# Patient Record
Sex: Female | Born: 1960 | Race: White | Hispanic: No | Marital: Married | Smoking: Never smoker
Health system: Southern US, Community
[De-identification: ages and names within clinical notes are randomized; demographics above are authoritative.]

## PROBLEM LIST (undated history)

## (undated) DIAGNOSIS — M86271 Subacute osteomyelitis, right ankle and foot: Secondary | ICD-10-CM

## (undated) DIAGNOSIS — E119 Type 2 diabetes mellitus without complications: Secondary | ICD-10-CM

## (undated) DIAGNOSIS — M5136 Other intervertebral disc degeneration, lumbar region: Secondary | ICD-10-CM

## (undated) DIAGNOSIS — N289 Disorder of kidney and ureter, unspecified: Secondary | ICD-10-CM

## (undated) DIAGNOSIS — R269 Unspecified abnormalities of gait and mobility: Secondary | ICD-10-CM

## (undated) DIAGNOSIS — I10 Essential (primary) hypertension: Secondary | ICD-10-CM

## (undated) DIAGNOSIS — D631 Anemia in chronic kidney disease: Secondary | ICD-10-CM

## (undated) DIAGNOSIS — N189 Chronic kidney disease, unspecified: Secondary | ICD-10-CM

## (undated) DIAGNOSIS — M51369 Other intervertebral disc degeneration, lumbar region without mention of lumbar back pain or lower extremity pain: Secondary | ICD-10-CM

## (undated) DIAGNOSIS — I96 Gangrene, not elsewhere classified: Secondary | ICD-10-CM

## (undated) DIAGNOSIS — G629 Polyneuropathy, unspecified: Secondary | ICD-10-CM

## (undated) HISTORY — DX: Unspecified abnormalities of gait and mobility: R26.9

## (undated) HISTORY — DX: Anemia in chronic kidney disease: D63.1

## (undated) HISTORY — DX: Essential (primary) hypertension: I10

## (undated) HISTORY — PX: KIDNEY TRANSPLANT: SHX239

## (undated) HISTORY — DX: Other intervertebral disc degeneration, lumbar region without mention of lumbar back pain or lower extremity pain: M51.369

## (undated) HISTORY — PX: LEG SURGERY: SHX1003

## (undated) HISTORY — DX: Disorder of kidney and ureter, unspecified: N28.9

## (undated) HISTORY — DX: Anemia in chronic kidney disease: N18.9

## (undated) HISTORY — DX: Polyneuropathy, unspecified: G62.9

## (undated) HISTORY — PX: BLADDER SURGERY: SHX569

## (undated) HISTORY — DX: Other intervertebral disc degeneration, lumbar region: M51.36

## (undated) NOTE — *Deleted (*Deleted)
Pharmacy Student Note: For Learning Purposes ONLY. Not an active part of the chart.  Chief Complaint:  [id number] presenting with anemia, AKI, and anasarca. PMH: T1DM, PAD, chronic OM, CKD stage 3A PSH: kidney and pancreas transplant; right BKA  Recommendations:  -waiting on cyclosporine lvl to be released  -continuing cyclosporine, azathio, and prednisone   Admit Complaint: Anticoagulation: heparin injection 5,000 units Q8H Infectious Disease Hx chronic osteomyelitis (right BKA) not on ABX Cardiovascular: anasarca-pitting edema all over the body 11/9-11/10 Bps 150s-160s Metoprolol tartrate 12.5 mg BID (PTA dose)  Endocrinology: T1DM; pred imm supp; levo 50 mcg 18 units glargine >  6 units BID d/t morning hypogly (70s) > now, BG in 200s and rising throughout the day   Gastrointestinal / Nutrition: supplementing  Nephrology: non-oliguric AKI Scr 3.05 --> 2.67 on 11/8 (improving)  > 2.47 > 2.21 today improving! > potentially new normal BL Scr 1.3 UOP 500 mL; 7.8 mL/kg  Electrolytes: Albumin repleted > 3.3 now Ca 9.4 K 5.7 (given Lokelma once) -->K 4.9 (rising up) -on cyclosporine and BB > will monitor (sod. Bicarb decr K) CO2 25 > on sodium bicarb at home and inpt Phosphorus: 7.3 (CKD stage 3)  --> 4.7 > 4.3   Hematology / Oncology: anemia Hgb trending down-8.7 11/10 Iron repleted per labs 11/3 2 units earlier in admit Aranesp 100 mcg QFriday   Dermatologic Left heel and sacrum wounds --> Santyl   PTA Medication Issues APAP 325  Hydrocodone/APAP 5-325 mg Q4HR Insulin aspart 2 units TID Insulin detemir 24 units daily Nutrition suppl. Sertraline 25 mg daily Sodium bicarbonate 650 mg BID Atorvastatin 20 mg QHS Azathioprine 50 mg daily Cyclosporine 75 mg BID Levothyroxine 50 mcg daily Metoprolol tartrate 12.5 mg BID Prednisone 5 mg daily   Best Practices   Glean Salvo, Festus Aloe PharmD Student

---

## 2019-11-27 DIAGNOSIS — E109 Type 1 diabetes mellitus without complications: Secondary | ICD-10-CM | POA: Diagnosis not present

## 2019-11-27 DIAGNOSIS — M810 Age-related osteoporosis without current pathological fracture: Secondary | ICD-10-CM | POA: Diagnosis not present

## 2019-11-27 DIAGNOSIS — Z940 Kidney transplant status: Secondary | ICD-10-CM | POA: Diagnosis not present

## 2019-11-27 DIAGNOSIS — Z9483 Pancreas transplant status: Secondary | ICD-10-CM | POA: Diagnosis not present

## 2020-01-01 DIAGNOSIS — E039 Hypothyroidism, unspecified: Secondary | ICD-10-CM | POA: Diagnosis not present

## 2020-01-01 DIAGNOSIS — E1065 Type 1 diabetes mellitus with hyperglycemia: Secondary | ICD-10-CM | POA: Diagnosis not present

## 2020-01-01 DIAGNOSIS — E1021 Type 1 diabetes mellitus with diabetic nephropathy: Secondary | ICD-10-CM | POA: Diagnosis not present

## 2020-01-01 DIAGNOSIS — E7800 Pure hypercholesterolemia, unspecified: Secondary | ICD-10-CM | POA: Diagnosis not present

## 2020-01-11 DIAGNOSIS — E1021 Type 1 diabetes mellitus with diabetic nephropathy: Secondary | ICD-10-CM | POA: Diagnosis not present

## 2020-01-21 DIAGNOSIS — E1021 Type 1 diabetes mellitus with diabetic nephropathy: Secondary | ICD-10-CM | POA: Diagnosis not present

## 2020-03-10 DIAGNOSIS — D631 Anemia in chronic kidney disease: Secondary | ICD-10-CM | POA: Diagnosis not present

## 2020-03-10 DIAGNOSIS — Z940 Kidney transplant status: Secondary | ICD-10-CM | POA: Diagnosis not present

## 2020-03-10 DIAGNOSIS — I129 Hypertensive chronic kidney disease with stage 1 through stage 4 chronic kidney disease, or unspecified chronic kidney disease: Secondary | ICD-10-CM | POA: Diagnosis not present

## 2020-03-10 DIAGNOSIS — N189 Chronic kidney disease, unspecified: Secondary | ICD-10-CM | POA: Diagnosis not present

## 2020-03-10 DIAGNOSIS — N1831 Chronic kidney disease, stage 3a: Secondary | ICD-10-CM | POA: Diagnosis not present

## 2020-03-10 DIAGNOSIS — N2581 Secondary hyperparathyroidism of renal origin: Secondary | ICD-10-CM | POA: Diagnosis not present

## 2020-03-11 DIAGNOSIS — N390 Urinary tract infection, site not specified: Secondary | ICD-10-CM | POA: Diagnosis not present

## 2020-03-17 DIAGNOSIS — L97529 Non-pressure chronic ulcer of other part of left foot with unspecified severity: Secondary | ICD-10-CM | POA: Diagnosis not present

## 2020-03-17 DIAGNOSIS — L97512 Non-pressure chronic ulcer of other part of right foot with fat layer exposed: Secondary | ICD-10-CM | POA: Diagnosis not present

## 2020-03-17 DIAGNOSIS — E08621 Diabetes mellitus due to underlying condition with foot ulcer: Secondary | ICD-10-CM | POA: Diagnosis not present

## 2020-03-17 DIAGNOSIS — L97519 Non-pressure chronic ulcer of other part of right foot with unspecified severity: Secondary | ICD-10-CM | POA: Diagnosis not present

## 2020-03-17 DIAGNOSIS — E1042 Type 1 diabetes mellitus with diabetic polyneuropathy: Secondary | ICD-10-CM | POA: Diagnosis not present

## 2020-03-17 DIAGNOSIS — L819 Disorder of pigmentation, unspecified: Secondary | ICD-10-CM | POA: Diagnosis not present

## 2020-03-28 DIAGNOSIS — N189 Chronic kidney disease, unspecified: Secondary | ICD-10-CM | POA: Diagnosis not present

## 2020-03-28 DIAGNOSIS — Z940 Kidney transplant status: Secondary | ICD-10-CM | POA: Diagnosis not present

## 2020-03-28 DIAGNOSIS — N1831 Chronic kidney disease, stage 3a: Secondary | ICD-10-CM | POA: Diagnosis not present

## 2020-03-31 DIAGNOSIS — N1831 Chronic kidney disease, stage 3a: Secondary | ICD-10-CM | POA: Diagnosis not present

## 2020-03-31 DIAGNOSIS — E10621 Type 1 diabetes mellitus with foot ulcer: Secondary | ICD-10-CM | POA: Diagnosis not present

## 2020-03-31 DIAGNOSIS — L97412 Non-pressure chronic ulcer of right heel and midfoot with fat layer exposed: Secondary | ICD-10-CM | POA: Diagnosis not present

## 2020-03-31 DIAGNOSIS — E1021 Type 1 diabetes mellitus with diabetic nephropathy: Secondary | ICD-10-CM | POA: Diagnosis not present

## 2020-03-31 DIAGNOSIS — Z940 Kidney transplant status: Secondary | ICD-10-CM | POA: Diagnosis not present

## 2020-03-31 DIAGNOSIS — D84821 Immunodeficiency due to drugs: Secondary | ICD-10-CM | POA: Diagnosis not present

## 2020-04-01 DIAGNOSIS — C44622 Squamous cell carcinoma of skin of right upper limb, including shoulder: Secondary | ICD-10-CM | POA: Diagnosis not present

## 2020-04-01 DIAGNOSIS — D0461 Carcinoma in situ of skin of right upper limb, including shoulder: Secondary | ICD-10-CM | POA: Diagnosis not present

## 2020-04-14 DIAGNOSIS — T8133XD Disruption of traumatic injury wound repair, subsequent encounter: Secondary | ICD-10-CM | POA: Diagnosis not present

## 2020-04-14 DIAGNOSIS — E10621 Type 1 diabetes mellitus with foot ulcer: Secondary | ICD-10-CM | POA: Diagnosis not present

## 2020-04-14 DIAGNOSIS — E1021 Type 1 diabetes mellitus with diabetic nephropathy: Secondary | ICD-10-CM | POA: Diagnosis not present

## 2020-04-14 DIAGNOSIS — L89623 Pressure ulcer of left heel, stage 3: Secondary | ICD-10-CM | POA: Diagnosis not present

## 2020-04-15 DIAGNOSIS — E1021 Type 1 diabetes mellitus with diabetic nephropathy: Secondary | ICD-10-CM | POA: Diagnosis not present

## 2020-04-17 DIAGNOSIS — M79672 Pain in left foot: Secondary | ICD-10-CM | POA: Diagnosis not present

## 2020-04-17 DIAGNOSIS — M2041 Other hammer toe(s) (acquired), right foot: Secondary | ICD-10-CM | POA: Diagnosis not present

## 2020-04-17 DIAGNOSIS — M2042 Other hammer toe(s) (acquired), left foot: Secondary | ICD-10-CM | POA: Diagnosis not present

## 2020-04-17 DIAGNOSIS — L97422 Non-pressure chronic ulcer of left heel and midfoot with fat layer exposed: Secondary | ICD-10-CM | POA: Diagnosis not present

## 2020-04-17 DIAGNOSIS — M79671 Pain in right foot: Secondary | ICD-10-CM | POA: Diagnosis not present

## 2020-04-17 DIAGNOSIS — M24572 Contracture, left ankle: Secondary | ICD-10-CM | POA: Diagnosis not present

## 2020-04-17 DIAGNOSIS — L97412 Non-pressure chronic ulcer of right heel and midfoot with fat layer exposed: Secondary | ICD-10-CM | POA: Diagnosis not present

## 2020-04-18 DIAGNOSIS — B9561 Methicillin susceptible Staphylococcus aureus infection as the cause of diseases classified elsewhere: Secondary | ICD-10-CM | POA: Diagnosis not present

## 2020-04-18 DIAGNOSIS — B961 Klebsiella pneumoniae [K. pneumoniae] as the cause of diseases classified elsewhere: Secondary | ICD-10-CM | POA: Diagnosis not present

## 2020-04-18 DIAGNOSIS — L97422 Non-pressure chronic ulcer of left heel and midfoot with fat layer exposed: Secondary | ICD-10-CM | POA: Diagnosis not present

## 2020-04-18 DIAGNOSIS — B966 Bacteroides fragilis [B. fragilis] as the cause of diseases classified elsewhere: Secondary | ICD-10-CM | POA: Diagnosis not present

## 2020-04-21 DIAGNOSIS — R296 Repeated falls: Secondary | ICD-10-CM | POA: Diagnosis not present

## 2020-04-21 DIAGNOSIS — L97512 Non-pressure chronic ulcer of other part of right foot with fat layer exposed: Secondary | ICD-10-CM | POA: Diagnosis not present

## 2020-04-21 DIAGNOSIS — Z1239 Encounter for other screening for malignant neoplasm of breast: Secondary | ICD-10-CM | POA: Diagnosis not present

## 2020-04-21 DIAGNOSIS — E1042 Type 1 diabetes mellitus with diabetic polyneuropathy: Secondary | ICD-10-CM | POA: Diagnosis not present

## 2020-04-23 DIAGNOSIS — E1021 Type 1 diabetes mellitus with diabetic nephropathy: Secondary | ICD-10-CM | POA: Diagnosis not present

## 2020-05-13 ENCOUNTER — Other Ambulatory Visit (HOSPITAL_COMMUNITY): Payer: Self-pay | Admitting: *Deleted

## 2020-05-13 NOTE — Discharge Instructions (Signed)

## 2020-05-14 ENCOUNTER — Inpatient Hospital Stay (HOSPITAL_COMMUNITY)
Admission: RE | Admit: 2020-05-14 | Discharge: 2020-05-14 | Disposition: A | Discharge status: Home / Self Care | Payer: Self-pay | Source: Ambulatory Visit | Attending: Nephrology | Admitting: Nephrology

## 2020-05-14 ENCOUNTER — Encounter: Payer: Self-pay | Admitting: Nephrology

## 2020-05-14 ENCOUNTER — Encounter (HOSPITAL_COMMUNITY): Payer: Self-pay

## 2020-05-14 DIAGNOSIS — N1831 Chronic kidney disease, stage 3a: Secondary | ICD-10-CM | POA: Insufficient documentation

## 2020-05-14 DIAGNOSIS — Z940 Kidney transplant status: Secondary | ICD-10-CM | POA: Insufficient documentation

## 2020-05-14 DIAGNOSIS — I10 Essential (primary) hypertension: Secondary | ICD-10-CM | POA: Insufficient documentation

## 2020-05-14 DIAGNOSIS — D631 Anemia in chronic kidney disease: Secondary | ICD-10-CM | POA: Insufficient documentation

## 2020-05-14 DIAGNOSIS — N1832 Chronic kidney disease, stage 3b: Secondary | ICD-10-CM | POA: Insufficient documentation

## 2020-05-14 DIAGNOSIS — N189 Chronic kidney disease, unspecified: Secondary | ICD-10-CM | POA: Insufficient documentation

## 2020-05-28 ENCOUNTER — Encounter (HOSPITAL_COMMUNITY): Payer: Self-pay

## 2020-06-02 ENCOUNTER — Encounter: Payer: Self-pay | Admitting: Nephrology

## 2020-06-02 DIAGNOSIS — I10 Essential (primary) hypertension: Secondary | ICD-10-CM | POA: Diagnosis not present

## 2020-06-02 DIAGNOSIS — M62830 Muscle spasm of back: Secondary | ICD-10-CM | POA: Diagnosis not present

## 2020-06-02 DIAGNOSIS — E782 Mixed hyperlipidemia: Secondary | ICD-10-CM | POA: Diagnosis not present

## 2020-06-02 DIAGNOSIS — E1042 Type 1 diabetes mellitus with diabetic polyneuropathy: Secondary | ICD-10-CM | POA: Diagnosis not present

## 2020-06-02 DIAGNOSIS — M549 Dorsalgia, unspecified: Secondary | ICD-10-CM | POA: Diagnosis not present

## 2020-06-02 NOTE — Discharge Instructions (Signed)

## 2020-06-03 ENCOUNTER — Encounter (HOSPITAL_COMMUNITY): Payer: Self-pay

## 2020-06-03 ENCOUNTER — Inpatient Hospital Stay (HOSPITAL_COMMUNITY)
Admission: RE | Admit: 2020-06-03 | Discharge: 2020-06-03 | Disposition: A | Discharge status: Home / Self Care | Payer: Self-pay | Source: Ambulatory Visit | Attending: Nephrology | Admitting: Nephrology

## 2020-06-17 ENCOUNTER — Encounter (HOSPITAL_COMMUNITY): Payer: Self-pay

## 2020-06-17 DIAGNOSIS — M545 Low back pain: Secondary | ICD-10-CM | POA: Diagnosis not present

## 2020-07-08 DIAGNOSIS — E1022 Type 1 diabetes mellitus with diabetic chronic kidney disease: Secondary | ICD-10-CM | POA: Diagnosis not present

## 2020-07-08 DIAGNOSIS — E1040 Type 1 diabetes mellitus with diabetic neuropathy, unspecified: Secondary | ICD-10-CM | POA: Diagnosis not present

## 2020-07-08 DIAGNOSIS — E10319 Type 1 diabetes mellitus with unspecified diabetic retinopathy without macular edema: Secondary | ICD-10-CM | POA: Diagnosis not present

## 2020-07-08 DIAGNOSIS — E039 Hypothyroidism, unspecified: Secondary | ICD-10-CM | POA: Diagnosis not present

## 2020-07-08 DIAGNOSIS — E1065 Type 1 diabetes mellitus with hyperglycemia: Secondary | ICD-10-CM | POA: Diagnosis not present

## 2020-07-22 DIAGNOSIS — M545 Low back pain: Secondary | ICD-10-CM | POA: Diagnosis not present

## 2020-07-22 DIAGNOSIS — M5416 Radiculopathy, lumbar region: Secondary | ICD-10-CM | POA: Diagnosis not present

## 2020-07-24 DIAGNOSIS — E1021 Type 1 diabetes mellitus with diabetic nephropathy: Secondary | ICD-10-CM | POA: Diagnosis not present

## 2020-07-28 DIAGNOSIS — M545 Low back pain: Secondary | ICD-10-CM | POA: Diagnosis not present

## 2020-08-08 DIAGNOSIS — M47816 Spondylosis without myelopathy or radiculopathy, lumbar region: Secondary | ICD-10-CM | POA: Diagnosis not present

## 2020-08-14 DIAGNOSIS — Z940 Kidney transplant status: Secondary | ICD-10-CM | POA: Diagnosis not present

## 2020-08-18 DIAGNOSIS — D631 Anemia in chronic kidney disease: Secondary | ICD-10-CM | POA: Diagnosis not present

## 2020-08-18 DIAGNOSIS — I129 Hypertensive chronic kidney disease with stage 1 through stage 4 chronic kidney disease, or unspecified chronic kidney disease: Secondary | ICD-10-CM | POA: Diagnosis not present

## 2020-08-18 DIAGNOSIS — Z940 Kidney transplant status: Secondary | ICD-10-CM | POA: Diagnosis not present

## 2020-08-18 DIAGNOSIS — N189 Chronic kidney disease, unspecified: Secondary | ICD-10-CM | POA: Diagnosis not present

## 2020-08-19 DIAGNOSIS — N189 Chronic kidney disease, unspecified: Secondary | ICD-10-CM | POA: Diagnosis not present

## 2020-08-19 DIAGNOSIS — Z940 Kidney transplant status: Secondary | ICD-10-CM | POA: Diagnosis not present

## 2020-08-20 DIAGNOSIS — B965 Pseudomonas (aeruginosa) (mallei) (pseudomallei) as the cause of diseases classified elsewhere: Secondary | ICD-10-CM | POA: Diagnosis not present

## 2020-08-20 DIAGNOSIS — B9689 Other specified bacterial agents as the cause of diseases classified elsewhere: Secondary | ICD-10-CM | POA: Diagnosis not present

## 2020-08-20 DIAGNOSIS — L97422 Non-pressure chronic ulcer of left heel and midfoot with fat layer exposed: Secondary | ICD-10-CM | POA: Diagnosis not present

## 2020-08-20 DIAGNOSIS — L97412 Non-pressure chronic ulcer of right heel and midfoot with fat layer exposed: Secondary | ICD-10-CM | POA: Diagnosis not present

## 2020-08-20 DIAGNOSIS — M2042 Other hammer toe(s) (acquired), left foot: Secondary | ICD-10-CM | POA: Diagnosis not present

## 2020-08-20 DIAGNOSIS — M2041 Other hammer toe(s) (acquired), right foot: Secondary | ICD-10-CM | POA: Diagnosis not present

## 2020-09-02 DIAGNOSIS — I10 Essential (primary) hypertension: Secondary | ICD-10-CM | POA: Diagnosis not present

## 2020-09-02 DIAGNOSIS — Z23 Encounter for immunization: Secondary | ICD-10-CM | POA: Diagnosis not present

## 2020-09-02 DIAGNOSIS — F321 Major depressive disorder, single episode, moderate: Secondary | ICD-10-CM | POA: Diagnosis not present

## 2020-09-02 DIAGNOSIS — Z0000 Encounter for general adult medical examination without abnormal findings: Secondary | ICD-10-CM | POA: Diagnosis not present

## 2020-09-02 DIAGNOSIS — R29898 Other symptoms and signs involving the musculoskeletal system: Secondary | ICD-10-CM | POA: Diagnosis not present

## 2020-09-02 DIAGNOSIS — Z940 Kidney transplant status: Secondary | ICD-10-CM | POA: Diagnosis not present

## 2020-09-04 DIAGNOSIS — M47816 Spondylosis without myelopathy or radiculopathy, lumbar region: Secondary | ICD-10-CM | POA: Diagnosis not present

## 2020-09-16 ENCOUNTER — Emergency Department (HOSPITAL_COMMUNITY)
Admission: EM | Admit: 2020-09-16 | Discharge: 2020-09-16 | Disposition: A | Discharge status: Home / Self Care | Payer: BC Managed Care – PPO | Attending: Emergency Medicine | Admitting: Emergency Medicine

## 2020-09-16 ENCOUNTER — Emergency Department (HOSPITAL_COMMUNITY): Payer: BC Managed Care – PPO

## 2020-09-16 ENCOUNTER — Encounter (HOSPITAL_COMMUNITY): Payer: Self-pay

## 2020-09-16 DIAGNOSIS — M545 Low back pain: Secondary | ICD-10-CM | POA: Insufficient documentation

## 2020-09-16 DIAGNOSIS — G8929 Other chronic pain: Secondary | ICD-10-CM | POA: Diagnosis not present

## 2020-09-16 DIAGNOSIS — R531 Weakness: Secondary | ICD-10-CM | POA: Diagnosis not present

## 2020-09-16 DIAGNOSIS — R0689 Other abnormalities of breathing: Secondary | ICD-10-CM | POA: Diagnosis not present

## 2020-09-16 DIAGNOSIS — M5489 Other dorsalgia: Secondary | ICD-10-CM | POA: Diagnosis not present

## 2020-09-16 DIAGNOSIS — M546 Pain in thoracic spine: Secondary | ICD-10-CM | POA: Diagnosis not present

## 2020-09-16 LAB — URINALYSIS, ROUTINE W REFLEX MICROSCOPIC
Bacteria, UA: NONE SEEN
Bilirubin Urine: NEGATIVE
Glucose, UA: NEGATIVE mg/dL
Ketones, ur: NEGATIVE mg/dL
Nitrite: NEGATIVE
Protein, ur: NEGATIVE mg/dL
Specific Gravity, Urine: 1.013 (ref 1.005–1.030)
pH: 5 (ref 5.0–8.0)

## 2020-09-16 LAB — CBC
HCT: 26.8 % — ABNORMAL LOW (ref 36.0–46.0)
Hemoglobin: 8.3 g/dL — ABNORMAL LOW (ref 12.0–15.0)
MCH: 35.9 pg — ABNORMAL HIGH (ref 26.0–34.0)
MCHC: 31 g/dL (ref 30.0–36.0)
MCV: 116 fL — ABNORMAL HIGH (ref 80.0–100.0)
Platelets: 290 10*3/uL (ref 150–400)
RBC: 2.31 MIL/uL — ABNORMAL LOW (ref 3.87–5.11)
RDW: 17.3 % — ABNORMAL HIGH (ref 11.5–15.5)
WBC: 7.1 10*3/uL (ref 4.0–10.5)
nRBC: 0 % (ref 0.0–0.2)

## 2020-09-16 LAB — COMPREHENSIVE METABOLIC PANEL
ALT: 19 U/L (ref 0–44)
AST: 23 U/L (ref 15–41)
Albumin: 2.3 g/dL — ABNORMAL LOW (ref 3.5–5.0)
Alkaline Phosphatase: 69 U/L (ref 38–126)
Anion gap: 12 (ref 5–15)
BUN: 47 mg/dL — ABNORMAL HIGH (ref 6–20)
CO2: 24 mmol/L (ref 22–32)
Calcium: 9.4 mg/dL (ref 8.9–10.3)
Chloride: 100 mmol/L (ref 98–111)
Creatinine, Ser: 1.33 mg/dL — ABNORMAL HIGH (ref 0.44–1.00)
GFR calc Af Amer: 50 mL/min — ABNORMAL LOW (ref >60)
GFR calc non Af Amer: 43 mL/min — ABNORMAL LOW (ref >60)
Glucose, Bld: 116 mg/dL — ABNORMAL HIGH (ref 70–99)
Potassium: 4.6 mmol/L (ref 3.5–5.1)
Sodium: 136 mmol/L (ref 135–145)
Total Bilirubin: 0.6 mg/dL (ref 0.3–1.2)
Total Protein: 5.9 g/dL — ABNORMAL LOW (ref 6.5–8.1)

## 2020-09-16 MED ORDER — HYDROCODONE-ACETAMINOPHEN 5-325 MG PO TABS: 1.0000 | ORAL_TABLET | ORAL | 0 refills | Status: DC | PRN

## 2020-09-16 MED ORDER — OXYCODONE HCL 5 MG PO TABS
5.0000 mg | ORAL_TABLET | Freq: Once | ORAL | Status: AC
  Administered 2020-09-16: 5 mg via ORAL
  Filled 2020-09-16: qty 1

## 2020-09-16 MED ORDER — LIDOCAINE 5 % EX PTCH: 1.0000 | MEDICATED_PATCH | CUTANEOUS | 0 refills | Status: DC

## 2020-09-16 NOTE — ED Triage Notes (Signed)
Pt presents via EMS from home. Pt has chronic back pain, hx of several kidney transplants. Pt does typically take medication for this but has been referred to a pain medication clinic. Pt has not been able to keep the pain under control since that time, not able to keep up with the pain management schedule.

## 2020-09-16 NOTE — Progress Notes (Signed)
TOC CM referral for [facility] PT/OT. Attempted call to pt and left HIPAA complaint message for a return call. Contacted ED provider for [facility] orders. [place], [place] ED TOC CM [phone]

## 2020-09-16 NOTE — Discharge Instructions (Addendum)
You were evaluated in the Emergency Department and after careful evaluation, we did not find any emergent condition requiring admission or further testing in the hospital.  Your exam/testing today was overall reassuring.  We recommend continued use of Tylenol at home for discomfort.  We also recommend using the numbing Lidoderm patches as needed.  For more significant pain you can use the Norco pain medication.  We think you would benefit from physical therapy at home.  Please return to the Emergency Department if you experience any worsening of your condition.  Thank you for allowing [place] to be a part of your care.

## 2020-09-16 NOTE — ED Notes (Addendum)
Patient unable to wait for SW and PT.

## 2020-09-16 NOTE — ED Provider Notes (Signed)
[facility] Emergency Department Provider Note MRN:  [id number]  Arrival date & time: 09/16/20     Chief Complaint   Back Pain   History of Present Illness   [name] is a 59 y.o. year-old female with a history of pancreas kidney transplant presenting to the ED with chief complaint of back pain.  Worsening low back pain over the past several weeks.  Worsening lower extremity weakness over the past 1 to 2 months.  Now having a lot of trouble walking due to the weakness.  Denies bowel or bladder dysfunction.  No fever.  Denies headache or vision change, no chest pain or shortness of breath, no abdominal pain, no burning with urination.  Pain is moderate to severe, Tylenol at home not helping.  Worse with motion and palpation.  Review of Systems  A complete 10 system review of systems was obtained and all systems are negative except as noted in the HPI and PMH.   Patient's Health History    Past Medical History:  Diagnosis Date  . Anemia of chronic renal failure     Past Surgical History:  Procedure Laterality Date  . BLADDER SURGERY    . KIDNEY TRANSPLANT    . LEG SURGERY      History reviewed. No pertinent family history.  Social History   Socioeconomic History  . Marital status: Married    Spouse name: Not on file  . Number of children: Not on file  . Years of education: Not on file  . Highest education level: Not on file  Occupational History  . Not on file  Tobacco Use  . Smoking status: Never Smoker  . Smokeless tobacco: Never Used  Substance and Sexual Activity  . Alcohol use: Never  . Drug use: Never  . Sexual activity: Not on file  Other Topics Concern  . Not on file  Social History Narrative  . Not on file   Social Determinants of Health   Financial Resource Strain:   . Difficulty of Paying Living Expenses: Not on file  Food Insecurity:   . Worried About Charity fundraiser in the Last Year: Not on file  . Ran Out of  Food in the Last Year: Not on file  Transportation Needs:   . Lack of Transportation (Medical): Not on file  . Lack of Transportation (Non-Medical): Not on file  Physical Activity:   . Days of Exercise per Week: Not on file  . Minutes of Exercise per Session: Not on file  Stress:   . Feeling of Stress : Not on file  Social Connections:   . Frequency of Communication with Friends and Family: Not on file  . Frequency of Social Gatherings with Friends and Family: Not on file  . Attends Religious Services: Not on file  . Active Member of Clubs or Organizations: Not on file  . Attends Archivist Meetings: Not on file  . Marital Status: Not on file  Intimate Partner Violence:   . Fear of Current or Ex-Partner: Not on file  . Emotionally Abused: Not on file  . Physically Abused: Not on file  . Sexually Abused: Not on file     Physical Exam   Vitals:   09/16/20 1138  BP: (!) 111/55  Pulse: (!) 101  Resp: 18  Temp: 98.5 F (36.9 C)  SpO2: 100%    CONSTITUTIONAL: Chronically ill-appearing, NAD NEURO:  Alert and oriented x 3, normal and symmetric sensation, decreased strength  to bilateral lower extremities, decreased patellar reflex on the left EYES: Right eye equal and reactive, chronic deformity to the left eye ENT/NECK:  no LAD, no JVD CARDIO: Regular rate, well-perfused, normal S1 and S2 PULM:  CTAB no wheezing or rhonchi GI/GU:  normal bowel sounds, non-distended, non-tender MSK/SPINE:  No gross deformities, no edema SKIN:  no rash, atraumatic PSYCH:  Appropriate speech and behavior  *Additional and/or pertinent findings included in MDM below  Diagnostic and Interventional Summary    EKG Interpretation  Date/Time:    Ventricular Rate:    PR Interval:    QRS Duration:   QT Interval:    QTC Calculation:   R Axis:     Text Interpretation:        Labs Reviewed  CBC - Abnormal; Notable for the following components:      Result Value   RBC 2.31 (*)     Hemoglobin 8.3 (*)    HCT 26.8 (*)    MCV 116.0 (*)    MCH 35.9 (*)    RDW 17.3 (*)    All other components within normal limits  COMPREHENSIVE METABOLIC PANEL - Abnormal; Notable for the following components:   Glucose, Bld 116 (*)    BUN 47 (*)    Creatinine, Ser 1.33 (*)    Total Protein 5.9 (*)    Albumin 2.3 (*)    GFR calc non Af Amer 43 (*)    GFR calc Af Amer 50 (*)    All other components within normal limits  URINALYSIS, ROUTINE W REFLEX MICROSCOPIC - Abnormal; Notable for the following components:   Hgb urine dipstick SMALL (*)    Leukocytes,Ua LARGE (*)    All other components within normal limits  URINE CULTURE    MR THORACIC SPINE WO CONTRAST  Final Result    MR LUMBAR SPINE WO CONTRAST  Final Result      Medications  oxyCODONE (Oxy IR/ROXICODONE) immediate release tablet 5 mg (5 mg Oral Given 09/16/20 1217)     Procedures  /  Critical Care Procedures  ED Course and Medical Decision Making  I have reviewed the triage vital signs, the nursing notes, and pertinent available records from the EMR.  Listed above are laboratory and imaging tests that I personally ordered, reviewed, and interpreted and then considered in my medical decision making (see below for details).  Back pain with progressive neurological deficit, will need MRI to exclude myelopathy.     MRI imaging is without acute process.  Given the slow chronic nature of her continued pain and weakness I suspect a component of deconditioning.  Patient is still able to ambulate with a walker short distances around the house and she and her husband are agreeable with a plan for discharge with home physical therapy and an augmented pain regimen.  We will have case management and/or physical therapy evaluate her prior to departure.  Barth Kirks. Sedonia Small, MD [place] [email or web address]  Final Clinical Impressions(s) / ED Diagnoses     ICD-10-CM   1. Chronic  midline low back pain without sciatica  M54.5    G89.29     ED Discharge Orders         Ordered    HYDROcodone-acetaminophen (NORCO/VICODIN) 5-325 MG tablet  Every 4 hours PRN        09/16/20 1452    lidocaine (LIDODERM) 5 %  Every 24 hours        09/16/20 1452  Discharge Instructions Discussed with and Provided to Patient:     Discharge Instructions     You were evaluated in the Emergency Department and after careful evaluation, we did not find any emergent condition requiring admission or further testing in the hospital.  Your exam/testing today was overall reassuring.  We recommend continued use of Tylenol at home for discomfort.  We also recommend using the numbing Lidoderm patches as needed.  For more significant pain you can use the Norco pain medication.  We think you would benefit from physical therapy at home.  Please return to the Emergency Department if you experience any worsening of your condition.  Thank you for allowing [place] to be a part of your care.        Maudie Flakes, MD 09/16/20 1455

## 2020-09-17 LAB — URINE CULTURE

## 2020-09-22 ENCOUNTER — Telehealth: Payer: Self-pay | Admitting: *Deleted

## 2020-09-22 NOTE — Telephone Encounter (Signed)
TOC CM received return call from patient stating she does want [facility] to come out. Offered choice and she stated agency that can come out soon. TOC contacted [facility] and they can do a start of care on Wednesday. Spoke to pt and she is agreeable to soc on Wednesday. States her husband is at home to assist with care but he will go out of town for a few days. She has [employer] at home. Explained she may have benefits with [employer] for aide to assist at home. CM discussed with [facility] assisting pt with [facility] benefits. [facility] rep states he will make a note for [facility] CSW for outside resources. [place], [place] ED TOC CM [phone]

## 2020-09-23 DIAGNOSIS — N186 End stage renal disease: Secondary | ICD-10-CM | POA: Diagnosis not present

## 2020-09-23 DIAGNOSIS — Z9483 Pancreas transplant status: Secondary | ICD-10-CM | POA: Diagnosis not present

## 2020-09-23 DIAGNOSIS — Z940 Kidney transplant status: Secondary | ICD-10-CM | POA: Diagnosis not present

## 2020-09-23 DIAGNOSIS — E11621 Type 2 diabetes mellitus with foot ulcer: Secondary | ICD-10-CM | POA: Diagnosis not present

## 2020-09-23 DIAGNOSIS — Z9181 History of falling: Secondary | ICD-10-CM | POA: Diagnosis not present

## 2020-09-23 DIAGNOSIS — Z9641 Presence of insulin pump (external) (internal): Secondary | ICD-10-CM | POA: Diagnosis not present

## 2020-09-23 DIAGNOSIS — M545 Low back pain: Secondary | ICD-10-CM | POA: Diagnosis not present

## 2020-09-23 DIAGNOSIS — L97519 Non-pressure chronic ulcer of other part of right foot with unspecified severity: Secondary | ICD-10-CM | POA: Diagnosis not present

## 2020-09-23 DIAGNOSIS — D631 Anemia in chronic kidney disease: Secondary | ICD-10-CM | POA: Diagnosis not present

## 2020-09-23 DIAGNOSIS — I120 Hypertensive chronic kidney disease with stage 5 chronic kidney disease or end stage renal disease: Secondary | ICD-10-CM | POA: Diagnosis not present

## 2020-09-23 DIAGNOSIS — G8929 Other chronic pain: Secondary | ICD-10-CM | POA: Diagnosis not present

## 2020-09-23 DIAGNOSIS — E1122 Type 2 diabetes mellitus with diabetic chronic kidney disease: Secondary | ICD-10-CM | POA: Diagnosis not present

## 2020-09-24 DIAGNOSIS — M545 Low back pain: Secondary | ICD-10-CM | POA: Diagnosis not present

## 2020-09-24 DIAGNOSIS — Z9641 Presence of insulin pump (external) (internal): Secondary | ICD-10-CM | POA: Diagnosis not present

## 2020-09-24 DIAGNOSIS — N186 End stage renal disease: Secondary | ICD-10-CM | POA: Diagnosis not present

## 2020-09-24 DIAGNOSIS — D631 Anemia in chronic kidney disease: Secondary | ICD-10-CM | POA: Diagnosis not present

## 2020-09-24 DIAGNOSIS — G8929 Other chronic pain: Secondary | ICD-10-CM | POA: Diagnosis not present

## 2020-09-24 DIAGNOSIS — E11621 Type 2 diabetes mellitus with foot ulcer: Secondary | ICD-10-CM | POA: Diagnosis not present

## 2020-09-24 DIAGNOSIS — I120 Hypertensive chronic kidney disease with stage 5 chronic kidney disease or end stage renal disease: Secondary | ICD-10-CM | POA: Diagnosis not present

## 2020-09-24 DIAGNOSIS — L97519 Non-pressure chronic ulcer of other part of right foot with unspecified severity: Secondary | ICD-10-CM | POA: Diagnosis not present

## 2020-09-24 DIAGNOSIS — Z940 Kidney transplant status: Secondary | ICD-10-CM | POA: Diagnosis not present

## 2020-09-24 DIAGNOSIS — Z9181 History of falling: Secondary | ICD-10-CM | POA: Diagnosis not present

## 2020-09-24 DIAGNOSIS — E1122 Type 2 diabetes mellitus with diabetic chronic kidney disease: Secondary | ICD-10-CM | POA: Diagnosis not present

## 2020-09-24 DIAGNOSIS — Z9483 Pancreas transplant status: Secondary | ICD-10-CM | POA: Diagnosis not present

## 2020-09-26 ENCOUNTER — Emergency Department (HOSPITAL_COMMUNITY): Payer: BC Managed Care – PPO

## 2020-09-26 ENCOUNTER — Encounter (HOSPITAL_COMMUNITY): Payer: Self-pay

## 2020-09-26 ENCOUNTER — Inpatient Hospital Stay (HOSPITAL_COMMUNITY)
Admission: EM | Admit: 2020-09-26 | Discharge: 2020-10-06 | DRG: 616 | Disposition: A | Discharge status: Skilled Nursing Facility | Payer: BC Managed Care – PPO | Attending: Family Medicine | Admitting: Family Medicine

## 2020-09-26 DIAGNOSIS — N179 Acute kidney failure, unspecified: Secondary | ICD-10-CM | POA: Diagnosis present

## 2020-09-26 DIAGNOSIS — Z940 Kidney transplant status: Secondary | ICD-10-CM

## 2020-09-26 DIAGNOSIS — E1110 Type 2 diabetes mellitus with ketoacidosis without coma: Secondary | ICD-10-CM | POA: Diagnosis present

## 2020-09-26 DIAGNOSIS — Z20822 Contact with and (suspected) exposure to covid-19: Secondary | ICD-10-CM | POA: Diagnosis not present

## 2020-09-26 DIAGNOSIS — L03115 Cellulitis of right lower limb: Secondary | ICD-10-CM

## 2020-09-26 DIAGNOSIS — L97519 Non-pressure chronic ulcer of other part of right foot with unspecified severity: Secondary | ICD-10-CM | POA: Diagnosis not present

## 2020-09-26 DIAGNOSIS — Z4781 Encounter for orthopedic aftercare following surgical amputation: Secondary | ICD-10-CM | POA: Diagnosis not present

## 2020-09-26 DIAGNOSIS — N189 Chronic kidney disease, unspecified: Secondary | ICD-10-CM | POA: Diagnosis not present

## 2020-09-26 DIAGNOSIS — R531 Weakness: Secondary | ICD-10-CM | POA: Diagnosis not present

## 2020-09-26 DIAGNOSIS — L02415 Cutaneous abscess of right lower limb: Secondary | ICD-10-CM | POA: Diagnosis not present

## 2020-09-26 DIAGNOSIS — I129 Hypertensive chronic kidney disease with stage 1 through stage 4 chronic kidney disease, or unspecified chronic kidney disease: Secondary | ICD-10-CM | POA: Diagnosis present

## 2020-09-26 DIAGNOSIS — B965 Pseudomonas (aeruginosa) (mallei) (pseudomallei) as the cause of diseases classified elsewhere: Secondary | ICD-10-CM | POA: Diagnosis present

## 2020-09-26 DIAGNOSIS — L0390 Cellulitis, unspecified: Secondary | ICD-10-CM | POA: Diagnosis not present

## 2020-09-26 DIAGNOSIS — E1040 Type 1 diabetes mellitus with diabetic neuropathy, unspecified: Secondary | ICD-10-CM | POA: Diagnosis not present

## 2020-09-26 DIAGNOSIS — E1022 Type 1 diabetes mellitus with diabetic chronic kidney disease: Secondary | ICD-10-CM | POA: Diagnosis not present

## 2020-09-26 DIAGNOSIS — M21372 Foot drop, left foot: Secondary | ICD-10-CM | POA: Diagnosis present

## 2020-09-26 DIAGNOSIS — M6281 Muscle weakness (generalized): Secondary | ICD-10-CM | POA: Diagnosis not present

## 2020-09-26 DIAGNOSIS — T8789 Other complications of amputation stump: Secondary | ICD-10-CM | POA: Insufficient documentation

## 2020-09-26 DIAGNOSIS — R55 Syncope and collapse: Secondary | ICD-10-CM | POA: Diagnosis not present

## 2020-09-26 DIAGNOSIS — E1069 Type 1 diabetes mellitus with other specified complication: Secondary | ICD-10-CM | POA: Diagnosis present

## 2020-09-26 DIAGNOSIS — I96 Gangrene, not elsewhere classified: Secondary | ICD-10-CM

## 2020-09-26 DIAGNOSIS — I70261 Atherosclerosis of native arteries of extremities with gangrene, right leg: Secondary | ICD-10-CM | POA: Diagnosis not present

## 2020-09-26 DIAGNOSIS — Z794 Long term (current) use of insulin: Secondary | ICD-10-CM

## 2020-09-26 DIAGNOSIS — N1831 Chronic kidney disease, stage 3a: Secondary | ICD-10-CM | POA: Diagnosis present

## 2020-09-26 DIAGNOSIS — L97429 Non-pressure chronic ulcer of left heel and midfoot with unspecified severity: Secondary | ICD-10-CM | POA: Diagnosis present

## 2020-09-26 DIAGNOSIS — Z7952 Long term (current) use of systemic steroids: Secondary | ICD-10-CM

## 2020-09-26 DIAGNOSIS — E1010 Type 1 diabetes mellitus with ketoacidosis without coma: Secondary | ICD-10-CM | POA: Diagnosis not present

## 2020-09-26 DIAGNOSIS — E108 Type 1 diabetes mellitus with unspecified complications: Secondary | ICD-10-CM | POA: Diagnosis present

## 2020-09-26 DIAGNOSIS — M86271 Subacute osteomyelitis, right ankle and foot: Secondary | ICD-10-CM | POA: Diagnosis not present

## 2020-09-26 DIAGNOSIS — R209 Unspecified disturbances of skin sensation: Secondary | ICD-10-CM | POA: Diagnosis not present

## 2020-09-26 DIAGNOSIS — E1310 Other specified diabetes mellitus with ketoacidosis without coma: Secondary | ICD-10-CM | POA: Diagnosis not present

## 2020-09-26 DIAGNOSIS — M726 Necrotizing fasciitis: Secondary | ICD-10-CM | POA: Diagnosis not present

## 2020-09-26 DIAGNOSIS — Z9641 Presence of insulin pump (external) (internal): Secondary | ICD-10-CM | POA: Diagnosis present

## 2020-09-26 DIAGNOSIS — Z9483 Pancreas transplant status: Secondary | ICD-10-CM | POA: Diagnosis not present

## 2020-09-26 DIAGNOSIS — D631 Anemia in chronic kidney disease: Secondary | ICD-10-CM | POA: Diagnosis present

## 2020-09-26 DIAGNOSIS — L97919 Non-pressure chronic ulcer of unspecified part of right lower leg with unspecified severity: Secondary | ICD-10-CM | POA: Diagnosis not present

## 2020-09-26 DIAGNOSIS — I959 Hypotension, unspecified: Secondary | ICD-10-CM | POA: Diagnosis not present

## 2020-09-26 DIAGNOSIS — R339 Retention of urine, unspecified: Secondary | ICD-10-CM | POA: Diagnosis not present

## 2020-09-26 DIAGNOSIS — S91301D Unspecified open wound, right foot, subsequent encounter: Secondary | ICD-10-CM | POA: Diagnosis not present

## 2020-09-26 DIAGNOSIS — Z89431 Acquired absence of right foot: Secondary | ICD-10-CM | POA: Diagnosis not present

## 2020-09-26 DIAGNOSIS — L89159 Pressure ulcer of sacral region, unspecified stage: Secondary | ICD-10-CM | POA: Diagnosis present

## 2020-09-26 DIAGNOSIS — N139 Obstructive and reflux uropathy, unspecified: Secondary | ICD-10-CM | POA: Diagnosis not present

## 2020-09-26 DIAGNOSIS — M86161 Other acute osteomyelitis, right tibia and fibula: Secondary | ICD-10-CM | POA: Diagnosis not present

## 2020-09-26 DIAGNOSIS — E1052 Type 1 diabetes mellitus with diabetic peripheral angiopathy with gangrene: Secondary | ICD-10-CM | POA: Diagnosis present

## 2020-09-26 DIAGNOSIS — G8918 Other acute postprocedural pain: Secondary | ICD-10-CM | POA: Diagnosis not present

## 2020-09-26 DIAGNOSIS — M2550 Pain in unspecified joint: Secondary | ICD-10-CM | POA: Diagnosis not present

## 2020-09-26 DIAGNOSIS — R918 Other nonspecific abnormal finding of lung field: Secondary | ICD-10-CM | POA: Diagnosis not present

## 2020-09-26 DIAGNOSIS — E13622 Other specified diabetes mellitus with other skin ulcer: Secondary | ICD-10-CM | POA: Diagnosis not present

## 2020-09-26 DIAGNOSIS — I10 Essential (primary) hypertension: Secondary | ICD-10-CM | POA: Diagnosis not present

## 2020-09-26 DIAGNOSIS — Z7401 Bed confinement status: Secondary | ICD-10-CM | POA: Diagnosis not present

## 2020-09-26 DIAGNOSIS — E875 Hyperkalemia: Secondary | ICD-10-CM | POA: Diagnosis present

## 2020-09-26 DIAGNOSIS — D539 Nutritional anemia, unspecified: Secondary | ICD-10-CM | POA: Diagnosis present

## 2020-09-26 DIAGNOSIS — R0602 Shortness of breath: Secondary | ICD-10-CM

## 2020-09-26 DIAGNOSIS — E1165 Type 2 diabetes mellitus with hyperglycemia: Secondary | ICD-10-CM | POA: Diagnosis not present

## 2020-09-26 DIAGNOSIS — Z9119 Patient's noncompliance with other medical treatment and regimen: Secondary | ICD-10-CM

## 2020-09-26 DIAGNOSIS — L89899 Pressure ulcer of other site, unspecified stage: Secondary | ICD-10-CM | POA: Insufficient documentation

## 2020-09-26 DIAGNOSIS — R52 Pain, unspecified: Secondary | ICD-10-CM | POA: Diagnosis not present

## 2020-09-26 DIAGNOSIS — N1832 Chronic kidney disease, stage 3b: Secondary | ICD-10-CM | POA: Diagnosis present

## 2020-09-26 DIAGNOSIS — E10621 Type 1 diabetes mellitus with foot ulcer: Secondary | ICD-10-CM | POA: Diagnosis not present

## 2020-09-26 DIAGNOSIS — Z79890 Hormone replacement therapy (postmenopausal): Secondary | ICD-10-CM

## 2020-09-26 DIAGNOSIS — L8990 Pressure ulcer of unspecified site, unspecified stage: Secondary | ICD-10-CM | POA: Insufficient documentation

## 2020-09-26 DIAGNOSIS — R0902 Hypoxemia: Secondary | ICD-10-CM | POA: Diagnosis not present

## 2020-09-26 DIAGNOSIS — Z79899 Other long term (current) drug therapy: Secondary | ICD-10-CM

## 2020-09-26 HISTORY — DX: Cellulitis of right lower limb: L03.115

## 2020-09-26 HISTORY — DX: Type 2 diabetes mellitus with ketoacidosis without coma: E11.10

## 2020-09-26 HISTORY — DX: Cutaneous abscess of right lower limb: L02.415

## 2020-09-26 LAB — I-STAT CHEM 8, ED
BUN: 115 mg/dL — ABNORMAL HIGH (ref 6–20)
Calcium, Ion: 1.16 mmol/L (ref 1.15–1.40)
Chloride: 94 mmol/L — ABNORMAL LOW (ref 98–111)
Creatinine, Ser: 3.5 mg/dL — ABNORMAL HIGH (ref 0.44–1.00)
Glucose, Bld: 679 mg/dL (ref 70–99)
HCT: 23 % — ABNORMAL LOW (ref 36.0–46.0)
Hemoglobin: 7.8 g/dL — ABNORMAL LOW (ref 12.0–15.0)
Potassium: 5.7 mmol/L — ABNORMAL HIGH (ref 3.5–5.1)
Sodium: 124 mmol/L — ABNORMAL LOW (ref 135–145)
TCO2: 12 mmol/L — ABNORMAL LOW (ref 22–32)

## 2020-09-26 LAB — BLOOD GAS, VENOUS
Acid-base deficit: 16.4 mmol/L — ABNORMAL HIGH (ref 0.0–2.0)
Bicarbonate: 10.4 mmol/L — ABNORMAL LOW (ref 20.0–28.0)
O2 Saturation: 63.3 %
Patient temperature: 98.6
pCO2, Ven: 28.5 mmHg — ABNORMAL LOW (ref 44.0–60.0)
pH, Ven: 7.187 — CL (ref 7.250–7.430)
pO2, Ven: 41.5 mmHg (ref 32.0–45.0)

## 2020-09-26 LAB — COMPREHENSIVE METABOLIC PANEL
ALT: 23 U/L (ref 0–44)
AST: 22 U/L (ref 15–41)
Albumin: 2 g/dL — ABNORMAL LOW (ref 3.5–5.0)
Alkaline Phosphatase: 96 U/L (ref 38–126)
Anion gap: 24 — ABNORMAL HIGH (ref 5–15)
BUN: 106 mg/dL — ABNORMAL HIGH (ref 6–20)
CO2: 9 mmol/L — ABNORMAL LOW (ref 22–32)
Calcium: 8.2 mg/dL — ABNORMAL LOW (ref 8.9–10.3)
Chloride: 91 mmol/L — ABNORMAL LOW (ref 98–111)
Creatinine, Ser: 3.63 mg/dL — ABNORMAL HIGH (ref 0.44–1.00)
GFR calc Af Amer: 15 mL/min — ABNORMAL LOW (ref >60)
GFR calc non Af Amer: 13 mL/min — ABNORMAL LOW (ref >60)
Glucose, Bld: 734 mg/dL (ref 70–99)
Potassium: 5.7 mmol/L — ABNORMAL HIGH (ref 3.5–5.1)
Sodium: 124 mmol/L — ABNORMAL LOW (ref 135–145)
Total Bilirubin: 2.5 mg/dL — ABNORMAL HIGH (ref 0.3–1.2)
Total Protein: 5.4 g/dL — ABNORMAL LOW (ref 6.5–8.1)

## 2020-09-26 LAB — CBG MONITORING, ED
Glucose-Capillary: 337 mg/dL — ABNORMAL HIGH (ref 70–99)
Glucose-Capillary: 382 mg/dL — ABNORMAL HIGH (ref 70–99)
Glucose-Capillary: 427 mg/dL — ABNORMAL HIGH (ref 70–99)
Glucose-Capillary: 489 mg/dL — ABNORMAL HIGH (ref 70–99)
Glucose-Capillary: 491 mg/dL — ABNORMAL HIGH (ref 70–99)
Glucose-Capillary: 503 mg/dL (ref 70–99)
Glucose-Capillary: 548 mg/dL (ref 70–99)
Glucose-Capillary: 562 mg/dL (ref 70–99)
Glucose-Capillary: >600 mg/dL (ref 70–99)

## 2020-09-26 LAB — CK: Total CK: 94 U/L (ref 38–234)

## 2020-09-26 LAB — CBC WITH DIFFERENTIAL/PLATELET
Abs Immature Granulocytes: 0.47 10*3/uL — ABNORMAL HIGH (ref 0.00–0.07)
Basophils Absolute: 0.1 10*3/uL (ref 0.0–0.1)
Basophils Relative: 0 %
Eosinophils Absolute: 0 10*3/uL (ref 0.0–0.5)
Eosinophils Relative: 0 %
HCT: 25 % — ABNORMAL LOW (ref 36.0–46.0)
Hemoglobin: 7.7 g/dL — ABNORMAL LOW (ref 12.0–15.0)
Immature Granulocytes: 3 %
Lymphocytes Relative: 2 %
Lymphs Abs: 0.3 10*3/uL — ABNORMAL LOW (ref 0.7–4.0)
MCH: 35.3 pg — ABNORMAL HIGH (ref 26.0–34.0)
MCHC: 30.8 g/dL (ref 30.0–36.0)
MCV: 114.7 fL — ABNORMAL HIGH (ref 80.0–100.0)
Monocytes Absolute: 0.6 10*3/uL (ref 0.1–1.0)
Monocytes Relative: 4 %
Neutro Abs: 14 10*3/uL — ABNORMAL HIGH (ref 1.7–7.7)
Neutrophils Relative %: 91 %
Platelets: 408 10*3/uL — ABNORMAL HIGH (ref 150–400)
RBC: 2.18 MIL/uL — ABNORMAL LOW (ref 3.87–5.11)
RDW: 17.7 % — ABNORMAL HIGH (ref 11.5–15.5)
WBC: 15.5 10*3/uL — ABNORMAL HIGH (ref 4.0–10.5)
nRBC: 0.2 % (ref 0.0–0.2)

## 2020-09-26 LAB — SEDIMENTATION RATE: Sed Rate: >140 mm/hr — ABNORMAL HIGH (ref 0–22)

## 2020-09-26 LAB — RESPIRATORY PANEL BY RT PCR (FLU A&B, COVID)
Influenza A by PCR: NEGATIVE
Influenza B by PCR: NEGATIVE
SARS Coronavirus 2 by RT PCR: NEGATIVE

## 2020-09-26 LAB — LACTIC ACID, PLASMA: Lactic Acid, Venous: 1.3 mmol/L (ref 0.5–1.9)

## 2020-09-26 LAB — BETA-HYDROXYBUTYRIC ACID: Beta-Hydroxybutyric Acid: 6.55 mmol/L — ABNORMAL HIGH (ref 0.05–0.27)

## 2020-09-26 MED ORDER — CYCLOSPORINE 25 MG PO CAPS
75.0000 mg | ORAL_CAPSULE | Freq: Two times a day (BID) | ORAL | Status: DC
  Administered 2020-09-27 – 2020-10-06 (×19): 75 mg via ORAL
  Filled 2020-09-26 (×22): qty 3

## 2020-09-26 MED ORDER — SODIUM CHLORIDE 0.9 % IV SOLN
1.0000 g | INTRAVENOUS | Status: DC
  Administered 2020-09-27 (×2): 1 g via INTRAVENOUS
  Filled 2020-09-26: qty 10
  Filled 2020-09-26: qty 1

## 2020-09-26 MED ORDER — ACETAMINOPHEN 325 MG PO TABS
650.0000 mg | ORAL_TABLET | Freq: Four times a day (QID) | ORAL | Status: DC | PRN
  Administered 2020-09-27 – 2020-10-05 (×3): 650 mg via ORAL
  Filled 2020-09-26 (×3): qty 2

## 2020-09-26 MED ORDER — DEXTROSE IN LACTATED RINGERS 5 % IV SOLN: INTRAVENOUS | Status: DC

## 2020-09-26 MED ORDER — ONDANSETRON HCL 4 MG/2ML IJ SOLN: 4.0000 mg | Freq: Four times a day (QID) | INTRAMUSCULAR | Status: DC | PRN

## 2020-09-26 MED ORDER — LEVOTHYROXINE SODIUM 50 MCG PO TABS
50.0000 ug | ORAL_TABLET | Freq: Every day | ORAL | Status: DC
  Administered 2020-09-27: 50 ug via ORAL
  Filled 2020-09-26: qty 1

## 2020-09-26 MED ORDER — LACTATED RINGERS IV SOLN: INTRAVENOUS | Status: DC

## 2020-09-26 MED ORDER — INSULIN REGULAR(HUMAN) IN NACL 100-0.9 UT/100ML-% IV SOLN
INTRAVENOUS | Status: DC
  Administered 2020-09-26: 9.5 [IU]/h via INTRAVENOUS

## 2020-09-26 MED ORDER — ONDANSETRON HCL 4 MG PO TABS
4.0000 mg | ORAL_TABLET | Freq: Four times a day (QID) | ORAL | Status: DC | PRN
  Filled 2020-09-26: qty 1

## 2020-09-26 MED ORDER — AZATHIOPRINE 50 MG PO TABS
50.0000 mg | ORAL_TABLET | Freq: Every day | ORAL | Status: DC
  Administered 2020-09-27 – 2020-10-06 (×9): 50 mg via ORAL
  Filled 2020-09-26 (×12): qty 1

## 2020-09-26 MED ORDER — DEXTROSE 50 % IV SOLN: 0.0000 mL | INTRAVENOUS | Status: DC | PRN

## 2020-09-26 MED ORDER — ATORVASTATIN CALCIUM 10 MG PO TABS
10.0000 mg | ORAL_TABLET | Freq: Every day | ORAL | Status: DC
  Administered 2020-09-27 – 2020-10-06 (×10): 10 mg via ORAL
  Filled 2020-09-26 (×10): qty 1

## 2020-09-26 MED ORDER — METOPROLOL TARTRATE 12.5 MG HALF TABLET
12.5000 mg | ORAL_TABLET | Freq: Every day | ORAL | Status: DC
  Administered 2020-09-27 – 2020-10-06 (×10): 12.5 mg via ORAL
  Filled 2020-09-26 (×11): qty 1

## 2020-09-26 MED ORDER — ACETAMINOPHEN 650 MG RE SUPP: 650.0000 mg | Freq: Four times a day (QID) | RECTAL | Status: DC | PRN

## 2020-09-26 MED ORDER — PREDNISONE 5 MG PO TABS
5.0000 mg | ORAL_TABLET | Freq: Every day | ORAL | Status: DC
  Administered 2020-09-27 – 2020-10-06 (×10): 5 mg via ORAL
  Filled 2020-09-26 (×11): qty 1

## 2020-09-26 MED ORDER — LACTATED RINGERS IV BOLUS
20.0000 mL/kg | Freq: Once | INTRAVENOUS | Status: AC
  Administered 2020-09-26: 908 mL via INTRAVENOUS

## 2020-09-26 MED ORDER — HEPARIN SODIUM (PORCINE) 5000 UNIT/ML IJ SOLN
5000.0000 [IU] | Freq: Three times a day (TID) | INTRAMUSCULAR | Status: DC
  Administered 2020-09-26 – 2020-09-27 (×2): 5000 [IU] via SUBCUTANEOUS
  Filled 2020-09-26 (×2): qty 1

## 2020-09-26 MED ORDER — INSULIN REGULAR(HUMAN) IN NACL 100-0.9 UT/100ML-% IV SOLN
INTRAVENOUS | Status: DC
  Administered 2020-09-26: 5 [IU]/h via INTRAVENOUS
  Filled 2020-09-26: qty 100

## 2020-09-26 MED ORDER — SERTRALINE HCL 25 MG PO TABS
25.0000 mg | ORAL_TABLET | Freq: Every day | ORAL | Status: DC
  Administered 2020-09-27 – 2020-10-06 (×10): 25 mg via ORAL
  Filled 2020-09-26 (×10): qty 1

## 2020-09-26 NOTE — ED Triage Notes (Signed)
Pt BIBA from home. Husband is out of town, pt being checked on by neighbor. Pt found on the floor. Pt c/o increasing weakness- unable to walk. Pt typically uses a walker or cane, but has been going downhill the last month CBG 560 with EMS. Quarter size wound on foot.  90/60 100 16  20 L AC

## 2020-09-26 NOTE — ED Provider Notes (Signed)
[facility] DEPT Provider Note   CSN: [id number] Arrival date & time: 09/26/20  1430     History Chief Complaint  Patient presents with  . Hyperglycemia    [name] is a 59 y.o. female.  HPI       [name] is a 59 y.o. female, with a history of chronic kidney disease, HTN, kidney transplant, pancreas transplant, DM type I, presenting to the ED with generalized weakness over the last couple days. She states her husband has been out of town since Monday, September 27.  She has been checked on by a neighbor.  Neighbor found her on the floor today.  Patient states she was feeling generally weak and lowered herself to the floor. She states she has been administering her insulin. She has chronic diarrhea with no acute changes. She has mild shortness of breath. She has a wound on her right foot that has been present for months.  She does not know if the drainage has changed. She has been vaccinated against COVID-19. Denies fever/chills, syncope, dizziness, chest pain, abdominal pain, hematochezia/melena, urinary symptoms, or any other complaints.   Past Medical History:  Diagnosis Date  . Anemia of chronic renal failure     Patient Active Problem List   Diagnosis Date Noted  . Diabetic ketoacidosis ([place]) 09/26/2020  . Acute kidney injury superimposed on CKD ([place]) 09/26/2020  . Cellulitis and abscess of right leg 09/26/2020  . 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 History of simultaneous kidney and pancreas transplant ([place]) 05/14/2020  . Chronic kidney disease (CKD) stage G3a/A1, moderately decreased glomerular filtration rate (GFR) between 45-59 mL/min/1.73 square meter and albuminuria creatinine ratio less than 30 mg/g ([facility]) 05/14/2020  . Hypertension 05/14/2020    Past Surgical History:  Procedure Laterality Date  . BLADDER SURGERY    . KIDNEY TRANSPLANT    . LEG SURGERY       OB History   No obstetric history on  file.     History reviewed. No pertinent family history.  Social History   Tobacco Use  . Smoking status: Never Smoker  . Smokeless tobacco: Never Used  Substance Use Topics  . Alcohol use: Never  . Drug use: Never    Home Medications Prior to Admission medications   Medication Sig Start Date End Date Taking? Authorizing Provider  atorvastatin (LIPITOR) 20 MG tablet Take 10 mg by mouth daily.  08/28/20  Yes [provider]  azaTHIOprine (IMURAN) 50 MG tablet Take 50 mg by mouth daily. 08/23/20  Yes [provider]  cycloSPORINE (SANDIMMUNE) 25 MG capsule Take 75 mg by mouth 2 (two) times daily.   Yes [provider]  levothyroxine (SYNTHROID) 50 MCG tablet Take 50 mcg by mouth daily. 08/23/20  Yes [provider]  lisinopril (ZESTRIL) 5 MG tablet Take 5 mg by mouth every evening.  08/31/20  Yes [provider]  metoprolol tartrate (LOPRESSOR) 25 MG tablet Take 12.5 mg by mouth daily. 09/15/20  Yes [provider]  NOVOLOG 100 UNIT/ML injection Inject 0-75 Units into the skin. Insulin Pump. Sliding Scale 07/29/20  Yes [provider]  predniSONE (DELTASONE) 5 MG tablet Take 5 mg by mouth daily. 07/13/20  Yes [provider]  sertraline (ZOLOFT) 25 MG tablet Take 25 mg by mouth daily. 09/02/20  Yes [provider]  HYDROcodone-acetaminophen (NORCO/VICODIN) 5-325 MG tablet Take 1 tablet by mouth every 4 (four) hours as needed. Patient not taking: Reported on 09/26/2020  09/16/20   Maudie Flakes, MD  lidocaine (LIDODERM) 5 % Place 1 patch onto the skin daily. Remove & Discard patch within 12 hours or as directed by MD Patient not taking: Reported on 09/26/2020 09/16/20   Maudie Flakes, MD    Allergies    Patient has no known allergies.  Review of Systems   Review of Systems  Constitutional: Negative for chills, diaphoresis and fever.  Respiratory: Positive for shortness of breath. Negative for cough.     Cardiovascular: Negative for chest pain and leg swelling.  Gastrointestinal: Negative for abdominal pain, blood in stool, diarrhea, nausea and vomiting.  Genitourinary: Negative for dysuria, flank pain and hematuria.  Skin: Positive for wound.  Neurological: Negative for syncope and weakness.  All other systems reviewed and are negative.   Physical Exam Updated Vital Signs BP (!) 111/56   Pulse 86   Temp 98.5 F (36.9 C) (Rectal)   Resp 13   Ht 5\' 3"  (1.6 m)   Wt 45.4 kg   SpO2 98%   BMI 17.71 kg/m   Physical Exam Vitals and nursing note reviewed.  Constitutional:      General: She is not in acute distress.    Appearance: She is well-developed. She is not diaphoretic.  HENT:     Head: Normocephalic and atraumatic.     Mouth/Throat:     Mouth: Mucous membranes are dry.     Pharynx: Oropharynx is clear.  Eyes:     Conjunctiva/sclera: Conjunctivae normal.  Cardiovascular:     Rate and Rhythm: Normal rate and regular rhythm.     Pulses: Normal pulses.          Radial pulses are 2+ on the right side and 2+ on the left side.       Posterior tibial pulses are 2+ on the right side and 2+ on the left side.     Heart sounds: Normal heart sounds.     Comments: Tactile temperature in the extremities appropriate and equal bilaterally. Pulmonary:     Effort: Pulmonary effort is normal. No respiratory distress.     Breath sounds: Normal breath sounds.  Abdominal:     Palpations: Abdomen is soft.     Tenderness: There is no abdominal tenderness. There is no guarding.  Musculoskeletal:     Cervical back: Neck supple.     Right lower leg: No edema.     Left lower leg: No edema.     Comments: Wound to the right lateral foot, as shown in the photos.  Brown drainage noted from the wound.  Lymphadenopathy:     Cervical: No cervical adenopathy.  Skin:    General: Skin is warm and dry.  Neurological:     Mental Status: She is alert.     Comments: Sensation light touch grossly  intact in the extremities. Motor function intact in the extremities. Patient generally weak.  Grip strength equal.  Psychiatric:        Mood and Affect: Mood and affect normal.        Speech: Speech normal.        Behavior: Behavior normal.                 ED Results / Procedures / Treatments   Labs (all labs ordered are listed, but only abnormal results are displayed) Labs Reviewed  COMPREHENSIVE METABOLIC PANEL - Abnormal; Notable for the following components:      Result Value   Sodium 124 (*)  Potassium 5.7 (*)    Chloride 91 (*)    CO2 9 (*)    Glucose, Bld 734 (*)    BUN 106 (*)    Creatinine, Ser 3.63 (*)    Calcium 8.2 (*)    Total Protein 5.4 (*)    Albumin 2.0 (*)    Total Bilirubin 2.5 (*)    GFR calc non Af Amer 13 (*)    GFR calc Af Amer 15 (*)    Anion gap 24 (*)    All other components within normal limits  BLOOD GAS, VENOUS - Abnormal; Notable for the following components:   pH, Ven 7.187 (*)    pCO2, Ven 28.5 (*)    Bicarbonate 10.4 (*)    Acid-base deficit 16.4 (*)    All other components within normal limits  CBC WITH DIFFERENTIAL/PLATELET - Abnormal; Notable for the following components:   WBC 15.5 (*)    RBC 2.18 (*)    Hemoglobin 7.7 (*)    HCT 25.0 (*)    MCV 114.7 (*)    MCH 35.3 (*)    RDW 17.7 (*)    Platelets 408 (*)    Neutro Abs 14.0 (*)    Lymphs Abs 0.3 (*)    Abs Immature Granulocytes 0.47 (*)    All other components within normal limits  BETA-HYDROXYBUTYRIC ACID - Abnormal; Notable for the following components:   Beta-Hydroxybutyric Acid 6.55 (*)    All other components within normal limits  CBG MONITORING, ED - Abnormal; Notable for the following components:   Glucose-Capillary >600 (*)    All other components within normal limits  CBG MONITORING, ED - Abnormal; Notable for the following components:   Glucose-Capillary 562 (*)    All other components within normal limits  I-STAT CHEM 8, ED - Abnormal; Notable  for the following components:   Sodium 124 (*)    Potassium 5.7 (*)    Chloride 94 (*)    BUN 115 (*)    Creatinine, Ser 3.50 (*)    Glucose, Bld 679 (*)    TCO2 12 (*)    Hemoglobin 7.8 (*)    HCT 23.0 (*)    All other components within normal limits  URINE CULTURE  CULTURE, BLOOD (ROUTINE X 2)  CULTURE, BLOOD (ROUTINE X 2)  RESPIRATORY PANEL BY RT PCR (FLU A&B, COVID)  AEROBIC CULTURE (SUPERFICIAL SPECIMEN)  MRSA PCR SCREENING  LACTIC ACID, PLASMA  CK  LACTIC ACID, PLASMA  URINALYSIS, ROUTINE W REFLEX MICROSCOPIC  BETA-HYDROXYBUTYRIC ACID  SEDIMENTATION RATE    Hemoglobin  Date Value Ref Range Status  09/26/2020 7.8 (L) 12.0 - 15.0 g/dL Final  09/26/2020 7.7 (L) 12.0 - 15.0 g/dL Final  09/16/2020 8.3 (L) 12.0 - 15.0 g/dL Final    EKG EKG Interpretation  Date/Time:  Friday September 26 2020 15:47:02 EDT Ventricular Rate:  85 PR Interval:    QRS Duration: 101 QT Interval:  420 QTC Calculation: 500 R Axis:   -99 Text Interpretation: Sinus rhythm Anteroseptal infarct, age indeterminate No old tracing to compare Confirmed by Calvert Cantor [phone]) on 09/26/2020 5:26:18 PM   Radiology DG Chest 1 View  Result Date: 09/26/2020 CLINICAL DATA:  Found on the floor. EXAM: CHEST  1 VIEW COMPARISON:  None. FINDINGS: Mild, diffuse, chronic appearing increased lung markings are seen. There is no evidence of acute infiltrate, pleural effusion or pneumothorax. The heart size and mediastinal contours are within normal limits. Radiopaque surgical clips are seen within the  soft tissues of the lateral left chest wall. A chronic deformity of the sixth right rib is seen. IMPRESSION: No acute cardiopulmonary disease. Electronically Signed   By: Virgina Norfolk M.D.   On: 09/26/2020 18:11   CT Head Wo Contrast  Result Date: 09/26/2020 CLINICAL DATA:  Found on the floor. EXAM: CT HEAD WITHOUT CONTRAST TECHNIQUE: Contiguous axial images were obtained from the base of the skull through the  vertex without intravenous contrast. COMPARISON:  None. FINDINGS: Brain: There is mild cerebral atrophy with widening of the extra-axial spaces and ventricular dilatation. There are areas of decreased attenuation within the white matter tracts of the supratentorial brain, consistent with microvascular disease changes. Vascular: No hyperdense vessel or unexpected calcification. Skull: Normal. Negative for fracture or focal lesion. Sinuses/Orbits: No acute finding. Other: None. IMPRESSION: 1. Generalized cerebral atrophy. 2. No acute intracranial abnormality. Electronically Signed   By: Virgina Norfolk M.D.   On: 09/26/2020 18:21   DG Foot Complete Right  Result Date: 09/26/2020 CLINICAL DATA:  Right foot wound. EXAM: RIGHT FOOT COMPLETE - 3+ VIEW COMPARISON:  None. FINDINGS: There is diffuse osteopenia. There is no evidence of acute fracture or dislocation. A radiopaque intramedullary rod is seen extending through the distal right tibia into the right talus and right calcaneus. Associated chronic deformities are seen within these regions. A chronic deformity of the distal left fibula is also noted. A radiopaque surgical screw is seen overlying the interphalangeal joint of the right great toe. A thin, 1.3 cm linear radiopaque soft tissue foreign body is seen adjacent to the plantar aspect of the fifth right metatarsal. IMPRESSION: 1. Chronic and postoperative changes, as described above, without evidence of an acute fracture. Electronically Signed   By: Virgina Norfolk M.D.   On: 09/26/2020 18:16    Procedures .Critical Care Performed by: Lorayne Bender, PA-C Authorized by: Lorayne Bender, PA-C   Critical care provider statement:    Critical care time (minutes):  35   Critical care time was exclusive of:  Separately billable procedures and treating other patients   Critical care was necessary to treat or prevent imminent or life-threatening deterioration of the following conditions:  Metabolic crisis    Critical care was time spent personally by me on the following activities:  Ordering and performing treatments and interventions, ordering and review of laboratory studies, ordering and review of radiographic studies, pulse oximetry, re-evaluation of patient's condition, review of old charts, obtaining history from patient or surrogate, discussions with consultants, development of treatment plan with patient or surrogate, evaluation of patient's response to treatment and examination of patient   I assumed direction of critical care for this patient from another provider in my specialty: no     (including critical care time)  Medications Ordered in ED Medications  lactated ringers infusion ( Intravenous New Bag/Given 09/26/20 1704)  dextrose 5 % in lactated ringers infusion (0 mLs Intravenous Hold 09/26/20 1711)  dextrose 50 % solution 0-50 mL (has no administration in time range)  insulin regular, human (MYXREDLIN) 100 units/ 100 mL infusion (5 Units/hr Intravenous New Bag/Given 09/26/20 1813)  lactated ringers bolus 908 mL (0 mL/kg  45.4 kg Intravenous Stopped 09/26/20 1817)    ED Course  I have reviewed the triage vital signs and the nursing notes.  Pertinent labs & imaging results that were available during my care of the patient were reviewed by me and considered in my medical decision making (see chart for details).  Clinical Course as of  Sep 27 1827  Fri Sep 26, 2020  1746 Spoke with Dr. Sloan Leiter, hospitalist.  Agrees to this patient.  Prefers that we hold off on systemic antibiotics until he can assess the wound.   [SJ]    Clinical Course User Index [SJ] Dorrine Montone, Helane Gunther, PA-C   MDM Rules/Calculators/A&P                          Patient presents with generalized weakness. Patient is nontoxic appearing, afebrile, not tachycardic, not tachypneic, not hypotensive, maintains excellent SPO2 on room air, and is in no apparent distress.   I have reviewed the patient's chart to obtain more  information.   I reviewed and interpreted the patient's labs and radiological studies. Hyperglycemic with evidence of acidosis, indicating DKA.  Dry on exam. Fluid boluses ordered.  Insulin order placed after potassium value returned. Patient admitted for further management.  Findings and plan of care discussed with Calvert Cantor, MD. Dr. Karle Starch personally evaluated and examined this patient.   Vitals:   09/26/20 1615 09/26/20 1630 09/26/20 1819 09/26/20 1830  BP: (!) 106/58 (!) 101/53 (!) 104/59 110/62  Pulse: 85 84 84 84  Resp: 15 15 16 14   Temp:      TempSrc:      SpO2: 96% 98% 100% 100%  Weight:      Height:         Final Clinical Impression(s) / ED Diagnoses Final diagnoses:  Diabetic ketoacidosis without coma associated with type 1 diabetes mellitus [facility])    Rx / DC Orders ED Discharge Orders    None       Layla Maw 09/26/20 1831    Truddie Hidden, MD 09/26/20 1842

## 2020-09-26 NOTE — ED Notes (Signed)
Reported the Chem 8 ISTAT results of Glucose of 679 to Nurse Janett Billow

## 2020-09-26 NOTE — H&P (Signed)
History and Physical    [name] WUJ:[id number] DOB: 08-Nov-1961 DOA: 09/26/2020  PCP: [name], MD  Patient coming from: Home  I have personally briefly reviewed patient's old medical records available.   Chief Complaint: Feeling weak, fell on the floor and could not get up  HPI: [name] is a 59 y.o. 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  She is with overall poor health, walks with a walker and lives with her husband.  She sent her husband to [place] Monday to put flowers on his family's graves and he is coming back today.  She has been trying to manage herself, however feeling extremely weak.  Has multiple issues as below. Patient stated she is eating and drinking okay, her bowel movements are okay and she has diarrhea which is chronic. Patient stated that she has insulin pump with sensor and it is a traumatic delivery of insulin, does not know the dose, however she believes it is working well. Denies any fever or chills.  Denies any nausea or vomiting.  Denies any abdominal pain. Patient has healing pressure ulcer on left heel.  She has an open purulent ulcer on right dorsum of the foot and she sees a podiatrist and wound care clinic for that.  Patient does not believe it is any worse than usual. ED Course: Blood pressure stable.  Afebrile.  On room air. WBC count 15.5.  Hemoglobin 7.7 at about baseline. BUN 106/creatinine 3.63-recent creatinine 1.33 last week on ER visit. Blood glucose 734 with anion gap and pH of 7.  1.  Beta hydroxybutyrate more than 6. Started on IV fluids and insulin drip and admission requested. Screening  COVID-19 test pending.  Patient is vaccinated. Cultures received from open wound on the right foot.  Review of Systems: all systems are reviewed and pertinent positive as per HPI otherwise rest are negative.    Past Medical History:  Diagnosis Date  . Anemia of chronic renal failure     Past Surgical History:  Procedure Laterality Date  . BLADDER SURGERY    . KIDNEY TRANSPLANT    . LEG SURGERY     Social history:   reports that she has never smoked. She has never used smokeless tobacco. She reports that she does not drink alcohol and does not use drugs.  No Known Allergies  History reviewed. No pertinent family history.   Prior to Admission medications   Medication Sig Start Date End Date Taking? Authorizing Provider  atorvastatin (LIPITOR) 20 MG tablet Take 10 mg by mouth daily.  08/28/20  Yes [provider]  azaTHIOprine (IMURAN) 50 MG tablet Take 50 mg by mouth daily. 08/23/20  Yes [provider]  cycloSPORINE (SANDIMMUNE) 25 MG capsule Take 75 mg by mouth 2 (two) times daily.   Yes [provider]  levothyroxine (SYNTHROID) 50 MCG tablet Take 50 mcg by mouth daily. 08/23/20  Yes [provider]  lisinopril (ZESTRIL) 5 MG tablet Take 5 mg by mouth every evening.  08/31/20  Yes [provider]  metoprolol tartrate (LOPRESSOR) 25 MG tablet Take 12.5 mg by mouth daily. 09/15/20  Yes [provider]  NOVOLOG 100 UNIT/ML injection Inject  0-75 Units into the skin. Insulin Pump. Sliding Scale 07/29/20  Yes [provider]  predniSONE (DELTASONE) 5 MG tablet Take 5 mg by mouth daily. 07/13/20  Yes [provider]  sertraline (ZOLOFT) 25 MG tablet Take 25 mg by mouth daily. 09/02/20  Yes [provider]    Physical Exam: Vitals:   09/26/20 1615 09/26/20 1630 09/26/20 1819 09/26/20 1830  BP: (!) 106/58 (!) 101/53 (!) 104/59 110/62  Pulse: 85 84 84 84  Resp: 15 15 16 14   Temp:      TempSrc:      SpO2: 96%  98% 100% 100%  Weight:      Height:        Constitutional: NAD, calm, comfortable Vitals:   09/26/20 1615 09/26/20 1630 09/26/20 1819 09/26/20 1830  BP: (!) 106/58 (!) 101/53 (!) 104/59 110/62  Pulse: 85 84 84 84  Resp: 15 15 16 14   Temp:      TempSrc:      SpO2: 96% 98% 100% 100%  Weight:      Height:       Eyes: PERRL, lids and conjunctivae dry. Chronically sick looking, older than his stated age, ecchymosis all over the body. Thinly built. Patient has visible cataract on the left eyes. ENMT: Mucous membranes are dry.  Posterior pharynx clear of any exudate or lesions.Normal dentition.  Neck: normal, supple, no masses, no thyromegaly Respiratory: clear to auscultation bilaterally, no wheezing, no crackles. Normal respiratory effort. No accessory muscle use.  Cardiovascular: Regular rate and rhythm, no murmurs / rubs / gallops. No extremity edema. 2+ pedal pulses. No carotid bruits.  Abdomen: no tenderness, no masses palpated. No hepatosplenomegaly. Bowel sounds positive.  Musculoskeletal: no clubbing / cyanosis. No joint deformity upper and lower extremities. Good ROM, no contractures. Normal muscle tone.  Generalized weakness. Skin: As below in the pictures. Neurologic: CN 2-12 grossly intact. Sensation intact, DTR normal. Strength 5/5 in all 4.  Generalized weakness. Psychiatric: Normal judgment and insight. Alert and oriented x 3. Normal mood.       Labs on Admission: I have personally reviewed following labs and imaging studies  CBC: Recent Labs  Lab 09/26/20 1534 09/26/20 1656  WBC 15.5*  --   NEUTROABS 14.0*  --   HGB 7.7* 7.8*  HCT 25.0* 23.0*  MCV 114.7*  --   PLT 408*  --    Basic Metabolic Panel: Recent Labs  Lab 09/26/20 1534 09/26/20 1656  NA 124* 124*  K 5.7* 5.7*  CL 91* 94*  CO2 9*  --   GLUCOSE 734* 679*  BUN 106* 115*  CREATININE 3.63* 3.50*  CALCIUM 8.2*  --    GFR: Estimated Creatinine Clearance: 12.3 mL/min (A) (by C-G formula  based on SCr of 3.5 mg/dL (H)). Liver Function Tests: Recent Labs  Lab 09/26/20 1534  AST 22  ALT 23  ALKPHOS 96  BILITOT 2.5*  PROT 5.4*  ALBUMIN 2.0*   No results for input(s): LIPASE, AMYLASE in the last 168 hours. No results for input(s): AMMONIA in the last 168 hours. Coagulation Profile: No results for input(s): INR, PROTIME in the last 168 hours. Cardiac Enzymes: Recent Labs  Lab 09/26/20 1634  CKTOTAL 94   BNP (last 3 results) No results for input(s): PROBNP in the last 8760 hours. HbA1C: No results for input(s): HGBA1C in the last 72 hours. CBG: Recent Labs  Lab 09/26/20 1447 09/26/20 1806 09/26/20 1847  GLUCAP >600* 562* 548*   Lipid Profile: No  results for input(s): CHOL, HDL, LDLCALC, TRIG, CHOLHDL, LDLDIRECT in the last 72 hours. Thyroid Function Tests: No results for input(s): TSH, T4TOTAL, FREET4, T3FREE, THYROIDAB in the last 72 hours. Anemia Panel: No results for input(s): VITAMINB12, FOLATE, FERRITIN, TIBC, IRON, RETICCTPCT in the last 72 hours. Urine analysis:    Component Value Date/Time   COLORURINE YELLOW 09/16/2020 1400   APPEARANCEUR CLEAR 09/16/2020 1400   LABSPEC 1.013 09/16/2020 1400   PHURINE 5.0 09/16/2020 1400   GLUCOSEU NEGATIVE 09/16/2020 1400   HGBUR SMALL (A) 09/16/2020 1400   BILIRUBINUR NEGATIVE 09/16/2020 1400   KETONESUR NEGATIVE 09/16/2020 1400   PROTEINUR NEGATIVE 09/16/2020 1400   NITRITE NEGATIVE 09/16/2020 1400   LEUKOCYTESUR LARGE (A) 09/16/2020 1400    Radiological Exams on Admission: DG Chest 1 View  Result Date: 09/26/2020 CLINICAL DATA:  Found on the floor. EXAM: CHEST  1 VIEW COMPARISON:  None. FINDINGS: Mild, diffuse, chronic appearing increased lung markings are seen. There is no evidence of acute infiltrate, pleural effusion or pneumothorax. The heart size and mediastinal contours are within normal limits. Radiopaque surgical clips are seen within the soft tissues of the lateral left chest wall. A chronic  deformity of the sixth right rib is seen. IMPRESSION: No acute cardiopulmonary disease. Electronically Signed   By: Virgina Norfolk M.D.   On: 09/26/2020 18:11   CT Head Wo Contrast  Result Date: 09/26/2020 CLINICAL DATA:  Found on the floor. EXAM: CT HEAD WITHOUT CONTRAST TECHNIQUE: Contiguous axial images were obtained from the base of the skull through the vertex without intravenous contrast. COMPARISON:  None. FINDINGS: Brain: There is mild cerebral atrophy with widening of the extra-axial spaces and ventricular dilatation. There are areas of decreased attenuation within the white matter tracts of the supratentorial brain, consistent with microvascular disease changes. Vascular: No hyperdense vessel or unexpected calcification. Skull: Normal. Negative for fracture or focal lesion. Sinuses/Orbits: No acute finding. Other: None. IMPRESSION: 1. Generalized cerebral atrophy. 2. No acute intracranial abnormality. Electronically Signed   By: Virgina Norfolk M.D.   On: 09/26/2020 18:21   DG Foot Complete Right  Result Date: 09/26/2020 CLINICAL DATA:  Right foot wound. EXAM: RIGHT FOOT COMPLETE - 3+ VIEW COMPARISON:  None. FINDINGS: There is diffuse osteopenia. There is no evidence of acute fracture or dislocation. A radiopaque intramedullary rod is seen extending through the distal right tibia into the right talus and right calcaneus. Associated chronic deformities are seen within these regions. A chronic deformity of the distal left fibula is also noted. A radiopaque surgical screw is seen overlying the interphalangeal joint of the right great toe. A thin, 1.3 cm linear radiopaque soft tissue foreign body is seen adjacent to the plantar aspect of the fifth right metatarsal. IMPRESSION: 1. Chronic and postoperative changes, as described above, without evidence of an acute fracture. Electronically Signed   By: Virgina Norfolk M.D.   On: 09/26/2020 18:16    EKG: Independently reviewed.  Sinus rhythm, low  voltage EKG with no obvious ST-T wave changes.  Assessment/Plan Principal Problem:   Diabetic ketoacidosis (HCC) Active Problems:   Anemia secondary to renal failure   History of simultaneous kidney and pancreas transplant ([place])   Chronic kidney disease (CKD) stage G3a/A1, moderately decreased glomerular filtration rate (GFR) between 45-59 mL/min/1.73 square meter and albuminuria creatinine ratio less than 30 mg/g (HCC)   Hypertension   Acute kidney injury superimposed on CKD (HCC)   Cellulitis and abscess of right leg     1.  Diabetic  ketoacidosis with history of type 1 diabetes: Admit to monitored unit given severity of symptoms.  Patient will need to be admitted to the stepdown unit for severity of disease. Vital signs every 4 hours. N.p.o. with only noncarb beverages until anion gap is closed. Blood sugars every hour until on IV insulin. BMP every 4 hours until on IV insulin.  Keep potassium 3.5-5, supplement as per protocol. Check magnesium and phosphorus tomorrow morning. Bolus IV fluids given in the ER. Keep on isotonic saline until blood sugars more than 250. When blood sugars less than 250, changed to 5% dextrose and continue insulin regimen. Will transition to subcu insulin once patient is able to take by mouth as well anion gap is closed. Hemoglobin A1c pending. Insulin pump removed before restarting insulin infusion.  2.  Acute kidney injury with history of stage IIIa chronic kidney disease, transplanted kidney on immunosuppressants, anemia of chronic disease. Lab Results  Component Value Date   CREATININE 3.50 (H) 09/26/2020   CREATININE 3.63 (H) 09/26/2020   CREATININE 1.33 (H) 09/16/2020   Probably prerenal.  Aggressively hydrate and recheck levels.  Intake and output monitoring. We will continue prednisone and CellCept, if her renal functions do not come back to normal, will discuss with her nephrologist about changing in the regimen.  Patient sees Dr. Graylon Gunning  from  [facility].  3.  Purulent cellulitis in chronic diabetic wound of the dorsum of the right foot: Patient has elevated WBC count, increased purulent drainage with no evidence of any osteomyelitis on her x-ray.  Will treat with Rocephin while in the hospital.  Local cultures will be sent to the lab.  Chronic open wound, no indication for I&D at this time.   Wound care consult for long-term care.  4.  Hypertension: On lisinopril.  This will be held temporarily because of acute renal failure.  We will continue metoprolol.  5.  Physical debility: She needs to work with PT OT.  May need more therapies at home.   DVT prophylaxis: Heparin subcu Code Status: Full code Family Communication: Call placed to patient's husband, unable to talk.  Patient stated he is driving back home. Disposition Plan: Home with home health therapies. Consults called: None, wound care Admission status: Inpatient, stepdown  Patient presents with DKA, acute renal failure, right foot abscess.  Has complexity of care.  With her current presentation she is anticipated to stay in the hospital for more than 2 midnights.   Barb Merino MD [facility] Pager [phone]

## 2020-09-26 NOTE — ED Notes (Signed)
Reported the Chem 8 ISTAT results of glucose of 679 to Dr. Karle Starch

## 2020-09-26 NOTE — ED Provider Notes (Signed)
Patient seen and examined, agree with assessment and plan by APP. Patient with prior kidney/pancreas transplant here with a worsening foot wound and DKA. Will be admitted for treatment of both. Remains hemodynamically stable. Resting comfortably. No distress.    Truddie Hidden, MD 09/26/20 Jeri Lager

## 2020-09-27 ENCOUNTER — Inpatient Hospital Stay (HOSPITAL_COMMUNITY): Payer: BC Managed Care – PPO

## 2020-09-27 ENCOUNTER — Encounter (HOSPITAL_COMMUNITY): Payer: Self-pay | Admitting: Internal Medicine

## 2020-09-27 ENCOUNTER — Other Ambulatory Visit: Payer: Self-pay

## 2020-09-27 DIAGNOSIS — E13622 Other specified diabetes mellitus with other skin ulcer: Secondary | ICD-10-CM

## 2020-09-27 DIAGNOSIS — N179 Acute kidney failure, unspecified: Secondary | ICD-10-CM

## 2020-09-27 DIAGNOSIS — E1165 Type 2 diabetes mellitus with hyperglycemia: Secondary | ICD-10-CM | POA: Diagnosis present

## 2020-09-27 DIAGNOSIS — R52 Pain, unspecified: Secondary | ICD-10-CM

## 2020-09-27 DIAGNOSIS — R209 Unspecified disturbances of skin sensation: Secondary | ICD-10-CM

## 2020-09-27 DIAGNOSIS — E1310 Other specified diabetes mellitus with ketoacidosis without coma: Secondary | ICD-10-CM

## 2020-09-27 DIAGNOSIS — E108 Type 1 diabetes mellitus with unspecified complications: Secondary | ICD-10-CM | POA: Diagnosis present

## 2020-09-27 DIAGNOSIS — L0390 Cellulitis, unspecified: Secondary | ICD-10-CM

## 2020-09-27 DIAGNOSIS — N189 Chronic kidney disease, unspecified: Secondary | ICD-10-CM

## 2020-09-27 DIAGNOSIS — L89899 Pressure ulcer of other site, unspecified stage: Secondary | ICD-10-CM | POA: Insufficient documentation

## 2020-09-27 DIAGNOSIS — D631 Anemia in chronic kidney disease: Secondary | ICD-10-CM

## 2020-09-27 DIAGNOSIS — L97519 Non-pressure chronic ulcer of other part of right foot with unspecified severity: Secondary | ICD-10-CM

## 2020-09-27 DIAGNOSIS — T8789 Other complications of amputation stump: Secondary | ICD-10-CM | POA: Insufficient documentation

## 2020-09-27 LAB — CBG MONITORING, ED
Glucose-Capillary: 252 mg/dL — ABNORMAL HIGH (ref 70–99)
Glucose-Capillary: 198 mg/dL — ABNORMAL HIGH (ref 70–99)
Glucose-Capillary: 176 mg/dL — ABNORMAL HIGH (ref 70–99)
Glucose-Capillary: 144 mg/dL — ABNORMAL HIGH (ref 70–99)
Glucose-Capillary: 135 mg/dL — ABNORMAL HIGH (ref 70–99)
Glucose-Capillary: 141 mg/dL — ABNORMAL HIGH (ref 70–99)
Glucose-Capillary: 125 mg/dL — ABNORMAL HIGH (ref 70–99)
Glucose-Capillary: 106 mg/dL — ABNORMAL HIGH (ref 70–99)

## 2020-09-27 LAB — BASIC METABOLIC PANEL
Anion gap: 16 — ABNORMAL HIGH (ref 5–15)
Anion gap: 14 (ref 5–15)
BUN: 99 mg/dL — ABNORMAL HIGH (ref 6–20)
BUN: 91 mg/dL — ABNORMAL HIGH (ref 6–20)
CO2: 18 mmol/L — ABNORMAL LOW (ref 22–32)
CO2: 20 mmol/L — ABNORMAL LOW (ref 22–32)
Calcium: 8.3 mg/dL — ABNORMAL LOW (ref 8.9–10.3)
Calcium: 8.1 mg/dL — ABNORMAL LOW (ref 8.9–10.3)
Chloride: 97 mmol/L — ABNORMAL LOW (ref 98–111)
Chloride: 99 mmol/L (ref 98–111)
Creatinine, Ser: 3.14 mg/dL — ABNORMAL HIGH (ref 0.44–1.00)
Creatinine, Ser: 3.1 mg/dL — ABNORMAL HIGH (ref 0.44–1.00)
GFR calc Af Amer: 18 mL/min — ABNORMAL LOW (ref >60)
GFR calc Af Amer: 18 mL/min — ABNORMAL LOW (ref >60)
GFR calc non Af Amer: 15 mL/min — ABNORMAL LOW (ref >60)
GFR calc non Af Amer: 16 mL/min — ABNORMAL LOW (ref >60)
Glucose, Bld: 395 mg/dL — ABNORMAL HIGH (ref 70–99)
Glucose, Bld: 211 mg/dL — ABNORMAL HIGH (ref 70–99)
Potassium: 4.8 mmol/L (ref 3.5–5.1)
Potassium: 4.8 mmol/L (ref 3.5–5.1)
Sodium: 131 mmol/L — ABNORMAL LOW (ref 135–145)
Sodium: 133 mmol/L — ABNORMAL LOW (ref 135–145)

## 2020-09-27 LAB — URINALYSIS, ROUTINE W REFLEX MICROSCOPIC
Bilirubin Urine: NEGATIVE
Glucose, UA: 50 mg/dL — AB
Hgb urine dipstick: NEGATIVE
Ketones, ur: NEGATIVE mg/dL
Leukocytes,Ua: NEGATIVE
Nitrite: NEGATIVE
Protein, ur: NEGATIVE mg/dL
Specific Gravity, Urine: 1.014 (ref 1.005–1.030)
pH: 5 (ref 5.0–8.0)

## 2020-09-27 LAB — MRSA PCR SCREENING: MRSA by PCR: NEGATIVE

## 2020-09-27 LAB — GLUCOSE, CAPILLARY
Glucose-Capillary: 50 mg/dL — ABNORMAL LOW (ref 70–99)
Glucose-Capillary: 70 mg/dL (ref 70–99)
Glucose-Capillary: 106 mg/dL — ABNORMAL HIGH (ref 70–99)

## 2020-09-27 LAB — PHOSPHORUS: Phosphorus: 4.4 mg/dL (ref 2.5–4.6)

## 2020-09-27 LAB — MAGNESIUM: Magnesium: 1.8 mg/dL (ref 1.7–2.4)

## 2020-09-27 LAB — BETA-HYDROXYBUTYRIC ACID: Beta-Hydroxybutyric Acid: 0.08 mmol/L (ref 0.05–0.27)

## 2020-09-27 LAB — HIV ANTIBODY (ROUTINE TESTING W REFLEX): HIV Screen 4th Generation wRfx: NONREACTIVE

## 2020-09-27 MED ORDER — HEPARIN BOLUS VIA INFUSION
2500.0000 [IU] | Freq: Once | INTRAVENOUS | Status: AC
  Administered 2020-09-27: 2500 [IU] via INTRAVENOUS
  Filled 2020-09-27: qty 2500

## 2020-09-27 MED ORDER — INSULIN ASPART 100 UNIT/ML ~~LOC~~ SOLN
3.0000 [IU] | Freq: Three times a day (TID) | SUBCUTANEOUS | Status: DC
  Administered 2020-09-27: 3 [IU] via SUBCUTANEOUS
  Filled 2020-09-27: qty 0.03

## 2020-09-27 MED ORDER — INSULIN ASPART 100 UNIT/ML ~~LOC~~ SOLN
0.0000 [IU] | Freq: Three times a day (TID) | SUBCUTANEOUS | Status: DC
  Administered 2020-09-28 (×2): 2 [IU] via SUBCUTANEOUS
  Administered 2020-09-29: 1 [IU] via SUBCUTANEOUS
  Administered 2020-09-29: 2 [IU] via SUBCUTANEOUS
  Administered 2020-09-30: 1 [IU] via SUBCUTANEOUS
  Administered 2020-10-01: 3 [IU] via SUBCUTANEOUS
  Administered 2020-10-01: 2 [IU] via SUBCUTANEOUS
  Administered 2020-10-02 (×2): 5 [IU] via SUBCUTANEOUS
  Filled 2020-09-27: qty 0.06

## 2020-09-27 MED ORDER — INSULIN ASPART 100 UNIT/ML ~~LOC~~ SOLN
0.0000 [IU] | Freq: Three times a day (TID) | SUBCUTANEOUS | Status: DC
  Filled 2020-09-27: qty 0.09

## 2020-09-27 MED ORDER — LACTATED RINGERS IV SOLN: INTRAVENOUS | Status: DC

## 2020-09-27 MED ORDER — INSULIN DETEMIR 100 UNIT/ML ~~LOC~~ SOLN
0.3000 [IU]/kg | SUBCUTANEOUS | Status: DC
  Administered 2020-09-27 – 2020-10-02 (×6): 14 [IU] via SUBCUTANEOUS
  Filled 2020-09-27 (×6): qty 0.14

## 2020-09-27 MED ORDER — INSULIN ASPART 100 UNIT/ML ~~LOC~~ SOLN
4.0000 [IU] | Freq: Three times a day (TID) | SUBCUTANEOUS | Status: DC
  Filled 2020-09-27: qty 0.04

## 2020-09-27 MED ORDER — LEVOTHYROXINE SODIUM 50 MCG PO TABS
50.0000 ug | ORAL_TABLET | Freq: Every day | ORAL | Status: DC
  Administered 2020-09-28 – 2020-10-06 (×8): 50 ug via ORAL
  Filled 2020-09-27 (×8): qty 1

## 2020-09-27 MED ORDER — HEPARIN (PORCINE) 25000 UT/250ML-% IV SOLN
700.0000 [IU]/h | INTRAVENOUS | Status: DC
  Administered 2020-09-27: 700 [IU]/h via INTRAVENOUS
  Filled 2020-09-27: qty 250

## 2020-09-27 MED ORDER — INSULIN ASPART 100 UNIT/ML ~~LOC~~ SOLN
0.0000 [IU] | Freq: Every day | SUBCUTANEOUS | Status: DC
  Administered 2020-10-02: 2 [IU] via SUBCUTANEOUS
  Filled 2020-09-27: qty 0.05

## 2020-09-27 MED ORDER — METRONIDAZOLE IN NACL 5-0.79 MG/ML-% IV SOLN
500.0000 mg | Freq: Three times a day (TID) | INTRAVENOUS | Status: DC
  Administered 2020-09-27 – 2020-09-28 (×2): 500 mg via INTRAVENOUS
  Filled 2020-09-27 (×2): qty 100

## 2020-09-27 NOTE — ED Notes (Signed)
Will d/c IV insulin in 2 hours per transition order.  Pt is resting comfortably

## 2020-09-27 NOTE — Consult Note (Signed)
[facility] [place]  Patient name: [name] MRN: [id number] DOB: 03-Apr-1961 Sex: female   REQUESTING PROVIDER:    Hospital Service   REASON FOR CONSULT:    PAD with ulcer  HISTORY OF PRESENT ILLNESS:   [name] is a 59 y.o. female, who was admitted with DKA.  She was also found to be in acute renal failure.  She has a wound on her right foot, and the infection is likely the source of her DKA.  Her right foot is chronically deformed and she has undergone instrumentation following a fracture to her right leg and foot.  There is concern over vascular insufficiency.  The patient states that she does ambulate with her leg.  She is also complaining of back pain from her fall.  PAST MEDICAL HISTORY    Past Medical History:  Diagnosis Date  . Anemia of chronic renal failure      FAMILY HISTORY   History reviewed. No pertinent family history.  SOCIAL HISTORY:   Social History   Socioeconomic History  . Marital status: Married    Spouse name: Not on file  . Number of children: Not on file  . Years of education: Not on file  . Highest education level: Not on file  Occupational History  . Not on file  Tobacco Use  . Smoking status: Never Smoker  . Smokeless tobacco: Never Used  Substance and Sexual Activity  . Alcohol use: Never  . Drug use: Never  . Sexual activity: Not on file  Other Topics Concern  . Not on file  Social History Narrative  . Not on file   Social Determinants of Health   Financial Resource Strain:   . Difficulty of Paying Living Expenses: Not on file  Food Insecurity:   . Worried About Charity fundraiser in the Last Year: Not on file  . Ran Out of Food in the Last Year: Not on file  Transportation Needs:   . Lack of Transportation (Medical): Not on file  . Lack of Transportation (Non-Medical): Not on file  Physical Activity:   . Days of Exercise per Week: Not on file  .  Minutes of Exercise per Session: Not on file  Stress:   . Feeling of Stress : Not on file  Social Connections:   . Frequency of Communication with Friends and Family: Not on file  . Frequency of Social Gatherings with Friends and Family: Not on file  . Attends Religious Services: Not on file  . Active Member of Clubs or Organizations: Not on file  . Attends Archivist Meetings: Not on file  . Marital Status: Not on file  Intimate Partner Violence:   . Fear of Current or Ex-Partner: Not on file  . Emotionally Abused: Not on file  . Physically Abused: Not on file  . Sexually Abused: Not on file    ALLERGIES:    No Known Allergies  CURRENT MEDICATIONS:    Current Facility-Administered Medications  Medication Dose Route Frequency Provider Last Rate Last Admin  . acetaminophen (TYLENOL) tablet 650 mg  650 mg Oral Q6H PRN Barb Merino, MD   650 mg at 09/27/20 1946   Or  . acetaminophen (TYLENOL) suppository 650 mg  650 mg Rectal Q6H PRN Barb Merino, MD      . atorvastatin (LIPITOR) tablet 10 mg  10 mg Oral Daily Barb Merino, MD   10 mg at 09/27/20 1058  . azaTHIOprine (IMURAN) tablet 50  mg  50 mg Oral Daily Barb Merino, MD   50 mg at 09/27/20 1657  . cefTRIAXone (ROCEPHIN) 1 g in sodium chloride 0.9 % 100 mL IVPB  1 g Intravenous Q24H Barb Merino, MD   Stopped at 09/27/20 0127  . cycloSPORINE (SANDIMMUNE) capsule 75 mg  75 mg Oral BID Barb Merino, MD   75 mg at 09/27/20 0153  . dextrose 50 % solution 0-50 mL  0-50 mL Intravenous PRN Barb Merino, MD      . heparin ADULT infusion 100 units/mL (25000 units/264mL sodium chloride 0.45%)  700 Units/hr Intravenous Continuous Barb Merino, MD 7 mL/hr at 09/27/20 1752 700 Units/hr at 09/27/20 1752  . insulin aspart (novoLOG) injection 0-5 Units  0-5 Units Subcutaneous QHS Ghimire, Dante Gang, MD      . insulin aspart (novoLOG) injection 0-6 Units  0-6 Units Subcutaneous TID WC Ghimire, Kuber, MD      . insulin  detemir (LEVEMIR) injection 14 Units  0.3 Units/kg Subcutaneous Q24H Barb Merino, MD   14 Units at 09/27/20 0927  . lactated ringers infusion   Intravenous Continuous Barb Merino, MD   Stopped at 09/27/20 1700  . [START ON 09/28/2020] levothyroxine (SYNTHROID) tablet 50 mcg  50 mcg Oral QAC breakfast Lenis Noon, [facility]      . metoprolol tartrate (LOPRESSOR) tablet 12.5 mg  12.5 mg Oral Daily Barb Merino, MD   12.5 mg at 09/27/20 1043  . metroNIDAZOLE (FLAGYL) IVPB 500 mg  500 mg Intravenous Q8H Barb Merino, MD 100 mL/hr at 09/27/20 1752 Rate Verify at 09/27/20 1752  . ondansetron (ZOFRAN) tablet 4 mg  4 mg Oral Q6H PRN Barb Merino, MD       Or  . ondansetron (ZOFRAN) injection 4 mg  4 mg Intravenous Q6H PRN Barb Merino, MD      . predniSONE (DELTASONE) tablet 5 mg  5 mg Oral Q breakfast Barb Merino, MD   5 mg at 09/27/20 1042  . sertraline (ZOLOFT) tablet 25 mg  25 mg Oral Daily Barb Merino, MD   25 mg at 09/27/20 1058    REVIEW OF SYSTEMS:   [X]  denotes positive finding, [ ]  denotes negative finding Cardiac  Comments:  Chest pain or chest pressure:    Shortness of breath upon exertion:    Short of breath when lying flat:    Irregular heart rhythm:        Vascular    Pain in calf, thigh, or hip brought on by ambulation:    Pain in feet at night that wakes you up from your sleep:     Blood clot in your veins:    Leg swelling:  x       Pulmonary    Oxygen at home:    Productive cough:     Wheezing:         Neurologic    Sudden weakness in arms or legs:     Sudden numbness in arms or legs:     Sudden onset of difficulty speaking or slurred speech:    Temporary loss of vision in one eye:     Problems with dizziness:         Gastrointestinal    Blood in stool:      Vomited blood:         Genitourinary    Burning when urinating:     Blood in urine:        Psychiatric    Major depression:  Hematologic    Bleeding problems:    Problems with  blood clotting too easily:        Skin    Rashes or ulcers:        Constitutional    Fever or chills:     PHYSICAL EXAM:   Vitals:   09/27/20 1400 09/27/20 1403 09/27/20 1615 09/27/20 2022  BP: 118/69 118/69 115/61 126/63  Pulse: 84 85 87 80  Resp: 13 12 14 16   Temp:   97.9 F (36.6 C) 98.4 F (36.9 C)  TempSrc:   Oral Oral  SpO2: 92% 95% 95% 93%  Weight:      Height:        GENERAL: The patient is a well-nourished female, in no acute distress. The vital signs are documented above. CARDIAC: There is a regular rate and rhythm.  VASCULAR: Nonpalpable pedal pulses PULMONARY: Nonlabored respirations MUSCULOSKELETAL: Right forefoot appears to be unstable NEUROLOGIC: No focal weakness or paresthesias are detected. SKIN: See photos below PSYCHIATRIC: The patient has a normal affect.        STUDIES:   I have reviewed the following studies: Right foot x-ray: There is diffuse osteopenia. There is no evidence of acute fracture or dislocation. A radiopaque intramedullary rod is seen extending through the distal right tibia into the right talus and right calcaneus. Associated chronic deformities are seen within these regions. A chronic deformity of the distal left fibula is also noted. A radiopaque surgical screw is seen overlying the interphalangeal joint of the right great toe. A thin, 1.3 cm linear radiopaque soft tissue foreign body is seen adjacent to the plantar aspect of the fifth right metatarsal.  Vascular lab studies: +-------+-----------+-----------+------------+------------+  Right 1.78    0                   +-------+-----------+-----------+------------+------------+  Left  1.85    0.21                  +-------+-----------+-----------+------------+------------+   Right: 50-74% stenosis noted in the superficial femoral artery. 50-74%  stenosis noted in the popliteal artery. Diffuse calcific plaque noted    throughout with abnormal waveforms.   ASSESSMENT and PLAN   Diabetic foot ulcer: This is likely a septic source for the patient.  She is on antibiotics.  The foot appears ischemic.  On examination the forefoot appears unstable.  I doubt that even with revascularization that she could heal this wound.  She remains in acute renal failure and so angiography is contraindicated at this point.  I would recommend orthopedic consultation for further evaluation of the foot, especially given that she has hardware in place.  My suspicion is that she is going to need transtibial amputation.  If she is able to recover renal function back to baseline, angiography could be considered however most likely she will need primary amputation.   Leia Alf, MD, FACS [facility] [phone] Pager [phone]

## 2020-09-27 NOTE — Progress Notes (Signed)
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  She fell on the floor and could not get up by herself in the absence of her husband.  Her neighbor checked on her and called EMS. In the emergency room patient was hemodynamically stable.  Blood glucose 734 with anion gap and pH of 7.1.  Beta hydroxybutyrate more than 6.  Treated for DKA and admitted.  Also found with acute kidney injury.   Assessment & Plan:   Principal Problem:   Diabetic ketoacidosis ([facility]) Active Problems:   Anemia secondary to renal failure   History of simultaneous kidney and pancreas transplant ([place])   Chronic kidney disease (CKD) stage G3a/A1, moderately decreased glomerular filtration rate (GFR) between 45-59 mL/min/1.73 square meter and albuminuria creatinine ratio less than 30 mg/g (HCC)   Hypertension   Acute kidney injury superimposed on CKD (HCC)   Cellulitis and abscess of right leg   DKA, type 1 (HCC)  Diabetic ketoacidosis with history of type 1 diabetes: Anion gap closed.  Patient with normal appetite.  Insulin pump discontinued.  IV insulin will be discontinued after starting subcu insulin. Transition to subcu insulin with long-acting insulin 14 units, prandial insulin 3 units 3 times daily and sliding scale.  Will continue to titrate as it comes. Continue IV fluids today for renal failure. Patient follows up with endocrinologist at [facility] that she will keep up. Pump is probably malfunctioning, will discharge with subcu insulin until follow-up.  Acute kidney injury with history of stage IIIa chronic kidney  disease, kidney pancreas transplant on immunosuppression therapy, anemia of chronic disease: Probably prerenal injury.  Some improvement.  Monitor output.  Catheterization if needed. Renal ultrasound today.  Urine cultures.  Continue Ringer lactate and encourage oral fluid intake. Discussed with patient's nephrology, will continue current doses of prednisone, cyclosporine and azathioprine.  Right leg cellulitis and abscess, chronic diabetic foot infection, pressure ulcer left foot: Patient has significant swelling and cellulitis of the right foot.  Open drainage present.  Patient reported presence of chronic wound in that area.  Local cultures done.  Started on Rocephin.  We will continue. Very poor prognosis on her right foot.  Hypertension: Lisinopril discontinued.  Continue metoprolol.  Physical debility: Work with PT OT.  Subconjunctival swelling/ chemosis: Patient has intact vision.  She has bilateral subconjunctival bogginess and swelling. Probably due to hypoalbuminemia.  Her albumin is 2.  Will monitor.  No evidence of superadded bacterial infection.   DVT prophylaxis: heparin injection 5,000 Units Start: 09/26/20 2245   Code Status: Full code Family Communication: Husband at bedside Disposition Plan: Status is: Inpatient  Remains inpatient appropriate because:Inpatient level of care appropriate due to severity of illness   Dispo: The patient is from: Home              Anticipated d/c is to: Home              Anticipated d/c date is: 2 days              Patient currently is not medically stable to d/c.         Consultants:   None.  Nephrology  curbside  Procedures:   None  Antimicrobials:  Antibiotics Given (last 72 hours)    None     Anti-infectives (From admission, onward)   Start     Dose/Rate Route Frequency Ordered Stop   09/26/20 2245  cefTRIAXone (ROCEPHIN) 1 g in sodium chloride 0.9 % 100 mL IVPB        1 g 200 mL/hr over 30 Minutes Intravenous  Every 24 hours 09/26/20 2244           Subjective: Patient was seen and examined.  No overnight events.  Denies any nausea vomiting.  Went back to examine patient when her husband arrived and patient was more impulsive.  She was crying and asking her endocrine doctor to be called to fix her insulin pump. Complains of blurry vision mostly on the left side.  Has some back pain. She has hesitancy and he has not urinated since last night. Remains afebrile. Tired of getting blood glucose checked every 15 minutes as per the patient.  Objective: Vitals:   09/27/20 1100 09/27/20 1200 09/27/20 1400 09/27/20 1403  BP: 130/72 107/69 118/69 118/69  Pulse: 90 86 84 85  Resp: 17 16 13 12   Temp:      TempSrc:      SpO2: 95% 96% 92% 95%  Weight:      Height:        Intake/Output Summary (Last 24 hours) at 09/27/2020 1448 Last data filed at 09/27/2020 1401 Gross per 24 hour  Intake 1892 ml  Output 500 ml  Net 1392 ml   Filed Weights   09/26/20 1452  Weight: 45.4 kg    Examination:  General exam: Chronically sick looking, not in any distress, anxious.  On room air. Respiratory system: Clear to auscultation. Respiratory effort normal.  No added sounds. Cardiovascular system: S1 & S2 heard, RRR. No JVD, murmurs. Gastrointestinal system: Abdomen is nondistended, soft and nontender. No organomegaly or masses felt. Normal bowel sounds heard. Central nervous system: Alert and oriented. No focal neurological deficits. Extremities: Symmetric 5 x 5 power. Skin: Multiple ecchymosis. Both eyes with chemosis   Pressure ulcer left foot. Right foot with open ulceration on the dorsum with redness and swelling.       Data Reviewed: I have personally reviewed following labs and imaging studies  CBC: Recent Labs  Lab 09/26/20 1534 09/26/20 1656  WBC 15.5*  --   NEUTROABS 14.0*  --   HGB 7.7* 7.8*  HCT 25.0* 23.0*  MCV 114.7*  --   PLT 408*  --    Basic Metabolic Panel: Recent Labs    Lab 09/26/20 1534 09/26/20 1656 09/26/20 2245 09/27/20 0245 09/27/20 0432  NA 124* 124* 131* 133*  --   K 5.7* 5.7* 4.8 4.8  --   CL 91* 94* 97* 99  --   CO2 9*  --  18* 20*  --   GLUCOSE 734* 679* 395* 211*  --   BUN 106* 115* 99* 91*  --   CREATININE 3.63* 3.50* 3.14* 3.10*  --   CALCIUM 8.2*  --  8.3* 8.1*  --   MG  --   --   --   --  1.8  PHOS  --   --   --   --  4.4   GFR: Estimated Creatinine Clearance: 13.8 mL/min (A) (by C-G formula based on SCr of 3.1 mg/dL (H)). Liver Function Tests: Recent Labs  Lab 09/26/20 1534  AST 22  ALT 23  ALKPHOS  96  BILITOT 2.5*  PROT 5.4*  ALBUMIN 2.0*   No results for input(s): LIPASE, AMYLASE in the last 168 hours. No results for input(s): AMMONIA in the last 168 hours. Coagulation Profile: No results for input(s): INR, PROTIME in the last 168 hours. Cardiac Enzymes: Recent Labs  Lab 09/26/20 1634  CKTOTAL 94   BNP (last 3 results) No results for input(s): PROBNP in the last 8760 hours. HbA1C: No results for input(s): HGBA1C in the last 72 hours. CBG: Recent Labs  Lab 09/27/20 0614 09/27/20 0756 09/27/20 0931 09/27/20 1037 09/27/20 1152  GLUCAP 144* 135* 141* 125* 106*   Lipid Profile: No results for input(s): CHOL, HDL, LDLCALC, TRIG, CHOLHDL, LDLDIRECT in the last 72 hours. Thyroid Function Tests: No results for input(s): TSH, T4TOTAL, FREET4, T3FREE, THYROIDAB in the last 72 hours. Anemia Panel: No results for input(s): VITAMINB12, FOLATE, FERRITIN, TIBC, IRON, RETICCTPCT in the last 72 hours. Sepsis Labs: Recent Labs  Lab 09/26/20 1535  LATICACIDVEN 1.3    Recent Results (from the past 240 hour(s))  Culture, blood (routine x 2)     Status: None (Preliminary result)   Collection Time: 09/26/20  4:45 PM   Specimen: BLOOD  Result Value Ref Range Status   Specimen Description   Final    BLOOD LEFT ANTECUBITAL Performed at [facility] [address], [place], [place] [address]     Special Requests   Final    BOTTLES DRAWN AEROBIC AND ANAEROBIC Blood Culture adequate volume Performed at [facility] [address] [place], [place] [address]    Culture   Final    NO GROWTH < 12 HOURS Performed at [facility] [address] [place], [place] [address]    Report Status PENDING  Incomplete  Culture, blood (routine x 2)     Status: None (Preliminary result)   Collection Time: 09/26/20  4:55 PM   Specimen: BLOOD RIGHT FOREARM  Result Value Ref Range Status   Specimen Description   Final    BLOOD RIGHT FOREARM Performed at [facility] [address] [place], [place] [address]    Special Requests   Final    BOTTLES DRAWN AEROBIC AND ANAEROBIC Blood Culture results may not be optimal due to an inadequate volume of blood received in culture bottles Performed at [facility] [address] [place], [place] [address]    Culture   Final    NO GROWTH < 12 HOURS Performed at [facility] [address] [place], [place] [address]    Report Status PENDING  Incomplete  Respiratory Panel by RT PCR (Flu A&B, Covid) - Nasopharyngeal Swab     Status: None   Collection Time: 09/26/20  5:06 PM   Specimen: Nasopharyngeal Swab  Result Value Ref Range Status   SARS Coronavirus 2 by RT PCR NEGATIVE NEGATIVE Final    Comment: (NOTE) SARS-CoV-2 target nucleic acids are NOT DETECTED.  The SARS-CoV-2 RNA is generally detectable in upper respiratoy specimens during the acute phase of infection. The lowest concentration of SARS-CoV-2 viral copies this assay can detect is 131 copies/mL. A negative result does not preclude SARS-Cov-2 infection and should not be used as the sole basis for treatment or other patient management decisions. A negative result may occur with  improper specimen collection/handling, submission of specimen other than nasopharyngeal swab, presence of viral mutation(s) within the areas  targeted by this assay, and inadequate number of viral  copies (<131 copies/mL). A negative result must be combined with clinical observations, patient history, and epidemiological information. The expected result is Negative.  Fact Sheet for Patients:  PinkCheek.be  Fact Sheet for Healthcare Providers:  GravelBags.it  This test is no t yet approved or cleared by the [place] FDA and  has been authorized for detection and/or diagnosis of SARS-CoV-2 by FDA under an Emergency Use Authorization (EUA). This EUA will remain  in effect (meaning this test can be used) for the duration of the COVID-19 declaration under Section 564(b)(1) of the Act, 21 U.S.C. section 360bbb-3(b)(1), unless the authorization is terminated or revoked sooner.     Influenza A by PCR NEGATIVE NEGATIVE Final   Influenza B by PCR NEGATIVE NEGATIVE Final    Comment: (NOTE) The Xpert Xpress SARS-CoV-2/FLU/RSV assay is intended as an aid in  the diagnosis of influenza from Nasopharyngeal swab specimens and  should not be used as a sole basis for treatment. Nasal washings and  aspirates are unacceptable for Xpert Xpress SARS-CoV-2/FLU/RSV  testing.  Fact Sheet for Patients: PinkCheek.be  Fact Sheet for Healthcare Providers: GravelBags.it  This test is not yet approved or cleared by the [place] FDA and  has been authorized for detection and/or diagnosis of SARS-CoV-2 by  FDA under an Emergency Use Authorization (EUA). This EUA will remain  in effect (meaning this test can be used) for the duration of the  Covid-19 declaration under Section 564(b)(1) of the Act, 21  U.S.C. section 360bbb-3(b)(1), unless the authorization is  terminated or revoked. Performed at [facility], [place] [address] [place], [place] [address]   Aerobic Culture (superficial specimen)     Status:  None (Preliminary result)   Collection Time: 09/26/20  5:34 PM   Specimen: Foot; Wound  Result Value Ref Range Status   Specimen Description   Final    FOOT RIGHT Performed at [facility] [address], [place], [place] [address]    Special Requests   Final    NONE Performed at [facility], [place] [address] [place], [place] [address]    Gram Stain NO WBC SEEN MODERATE GRAM NEGATIVE RODS   Final   Culture   Final    TOO YOUNG TO READ Performed at [facility], [place] [address] [place], [place] [address]    Report Status PENDING  Incomplete  MRSA PCR Screening     Status: None   Collection Time: 09/27/20  4:32 AM   Specimen: Nasal Mucosa; Nasopharyngeal  Result Value Ref Range Status   MRSA by PCR NEGATIVE NEGATIVE Final    Comment:        The GeneXpert MRSA Assay (FDA approved for NASAL specimens only), is one component of a comprehensive MRSA colonization surveillance program. It is not intended to diagnose MRSA infection nor to guide or monitor treatment for MRSA infections. Performed at [facility], [place] [address] [place], [place] [address]          Radiology Studies: DG Chest 1 View  Result Date: 09/26/2020 CLINICAL DATA:  Found on the floor. EXAM: CHEST  1 VIEW COMPARISON:  None. FINDINGS: Mild, diffuse, chronic appearing increased lung markings are seen. There is no evidence of acute infiltrate, pleural effusion or pneumothorax. The heart size and mediastinal contours are within normal limits. Radiopaque surgical clips are seen within the soft tissues of the lateral left chest wall. A chronic deformity of the sixth right rib is  seen. IMPRESSION: No acute cardiopulmonary disease. Electronically Signed   By: Virgina Norfolk M.D.   On: 09/26/2020 18:11   CT Head Wo Contrast  Result Date: 09/26/2020 CLINICAL DATA:  Found on the floor. EXAM: CT HEAD WITHOUT CONTRAST TECHNIQUE: Contiguous axial  images were obtained from the base of the skull through the vertex without intravenous contrast. COMPARISON:  None. FINDINGS: Brain: There is mild cerebral atrophy with widening of the extra-axial spaces and ventricular dilatation. There are areas of decreased attenuation within the white matter tracts of the supratentorial brain, consistent with microvascular disease changes. Vascular: No hyperdense vessel or unexpected calcification. Skull: Normal. Negative for fracture or focal lesion. Sinuses/Orbits: No acute finding. Other: None. IMPRESSION: 1. Generalized cerebral atrophy. 2. No acute intracranial abnormality. Electronically Signed   By: Virgina Norfolk M.D.   On: 09/26/2020 18:21   DG Foot Complete Right  Result Date: 09/26/2020 CLINICAL DATA:  Right foot wound. EXAM: RIGHT FOOT COMPLETE - 3+ VIEW COMPARISON:  None. FINDINGS: There is diffuse osteopenia. There is no evidence of acute fracture or dislocation. A radiopaque intramedullary rod is seen extending through the distal right tibia into the right talus and right calcaneus. Associated chronic deformities are seen within these regions. A chronic deformity of the distal left fibula is also noted. A radiopaque surgical screw is seen overlying the interphalangeal joint of the right great toe. A thin, 1.3 cm linear radiopaque soft tissue foreign body is seen adjacent to the plantar aspect of the fifth right metatarsal. IMPRESSION: 1. Chronic and postoperative changes, as described above, without evidence of an acute fracture. Electronically Signed   By: Virgina Norfolk M.D.   On: 09/26/2020 18:16        Scheduled Meds: . atorvastatin  10 mg Oral Daily  . azaTHIOprine  50 mg Oral Daily  . cycloSPORINE  75 mg Oral BID  . heparin  5,000 Units Subcutaneous Q8H  . insulin aspart  0-5 Units Subcutaneous QHS  . insulin aspart  0-6 Units Subcutaneous TID WC  . insulin aspart  3 Units Subcutaneous TID WC  . insulin detemir  0.3 Units/kg  Subcutaneous Q24H  . levothyroxine  50 mcg Oral Daily  . metoprolol tartrate  12.5 mg Oral Daily  . predniSONE  5 mg Oral Q breakfast  . sertraline  25 mg Oral Daily   Continuous Infusions: . cefTRIAXone (ROCEPHIN)  IV Stopped (09/27/20 0127)  . lactated ringers 100 mL/hr at 09/27/20 1101     LOS: 1 day    Time spent: 35 minutes    Barb Merino, MD [facility] Pager [phone]

## 2020-09-27 NOTE — ED Notes (Signed)
MD contacted for transition orders.

## 2020-09-27 NOTE — ED Notes (Signed)
I&O cath yielded 500 of amber urine. As far as I am aware this is the only urine pt has produced since here in ED.  Pt also attempted to have BM, only a smear was made, cleaned pt with soapy water and placed a new purewick and depend.  Pt has swelling to right foot with dressing in place, foot is purple with cap refill 6sec.  MD notified

## 2020-09-27 NOTE — Progress Notes (Signed)
Inpatient Diabetes Program Recommendations  AACE/ADA: New Consensus Statement on Inpatient Glycemic Control   Target Ranges:  Prepandial:   less than 140 mg/dL      Peak postprandial:   less than 180 mg/dL (1-2 hours)      Critically ill patients:  140 - 180 mg/dL  Results for EVI, MCCOMB (MRN [id number]) as of 09/27/2020 09:06  Ref. Range 09/26/2020 23:50 09/27/2020 01:23 09/27/2020 02:35 09/27/2020 04:08 09/27/2020 06:14 09/27/2020 07:56  Glucose-Capillary Latest Ref Range: 70 - 99 mg/dL 337 (H) 252 (H) 198 (H) 176 (H) 144 (H) 135 (H)   Results for DELORIES, MAURI (MRN [id number]) as of 09/27/2020 09:06  Ref. Range 09/26/2020 15:34  CO2 Latest Ref Range: 22 - 32 mmol/L 9 (L)  Glucose Latest Ref Range: 70 - 99 mg/dL 734 (HH)  Anion gap Latest Ref Range: 5 - 15  24 (H)  Results for ECKO, BEASLEY (MRN [id number]) as of 09/27/2020 09:06  Ref. Range 09/26/2020 16:26  Beta-Hydroxybutyric Acid Latest Ref Range: 0.05 - 0.27 mmol/L 6.55 (H)   Review of Glycemic Control  Diabetes history: DM1 (makes NO insulin; requires basal, correction, and carbohydrate coverage insulin) Outpatient Diabetes medications: Omnipod insulin pump with Novolog Current orders for Inpatient glycemic control: Levemir 14 units Q24H, Novolog 4 units TID with meals, Novolog 0-9 units TID with meals, Novolog 0-5 units QHS; Prednisone 5 mg daily  Inpatient Diabetes Program Recommendations:    Insulin: Please consider decreasing Novolog correction to very sensitive scale 0-6 units TID with meals and meal coverage to Novolog 3 units TID with meals.   NOTE: Noted consult for Diabetes Coordinator. Diabetes Coordinator is not on campus over the weekend but available by pager from 8am to 5pm for questions or concerns. In reviewing chart, noted patient seen Dr. Hartford Poli (Endocrinologist) on 07/26/20 and per office note the following should be insulin pump settings:   Basal insulin  12A 0.6 units/hour 9A 1.0 units/hour 4P 0.85  units/hour Total daily basal insulin: 19.2 units/24 hours  Carb Coverage 1:20 1 unit for every 20 grams of carbohydrates  Insulin Sensitivity 1:88 1 unit drops blood glucose 88 mg/dl  Will continue to follow along while inpatient.  Thanks, Barnie Alderman, RN, MSN, CDE Diabetes Coordinator Inpatient Diabetes Program [phone] (Team Pager from 8am to 5pm)

## 2020-09-27 NOTE — Progress Notes (Signed)
CRITICAL VALUE ALERT  Critical Value:  CBG 50  Date & Time Notied:  09/27/20 1615  Provider Notified: 09/27/20  1615  Orders Received/Actions taken:  Dr Sloan Leiter present when CBG taken. States that d/t PVD reading may not be accurate. Patient awake, alert, speaking, denies any symptoms of hypoglycemia. MD ordered to hold scheduled insulin, patient's dinner has arrived. Will recheck after eating.

## 2020-09-27 NOTE — ED Notes (Signed)
Pt has purewick in place.  Reminded her of need for urine sample with each nursing round.  Pt does not feel urge to void.  Pt repostioned in bed.  Husband visiting.  Snack given

## 2020-09-27 NOTE — Progress Notes (Signed)
VASCULAR LAB    ABIs has been performed.  See CV proc for preliminary results.   Venetta Knee, RVT 09/27/2020, 6:40 PM

## 2020-09-27 NOTE — Progress Notes (Signed)
PT Cancellation Note  Patient Details Name: Samanvitha Germany MRN: [id number] DOB: 12-31-1960   Cancelled Treatment:     PT order received but eval deferred.  Pt in ED but with room assignment (1615) and transferring shortly.  Will follow.  Debe Coder PT Acute Rehabilitation Services Pager [phone] Office [phone]    [facility] 09/27/2020, 2:23 PM

## 2020-09-27 NOTE — Progress Notes (Signed)
Aware about the purplish discoloration of right toes with decreased capillary refill, more pronounced with exposure to cold. Her whole right dorsum of foot is swollen and purplish.   Purplish toes probably from infection or distal ischemia in a poorly functioning deformed foot.  I called and discussed this with patient's husband . Any contrast studies or angiogram will put her to dialysis and they want to avoid it. The right foot is with very poor function to start with due to deformity and chronic non healing ulcer.  Plan:   Will start heparin infusion if that helps with any small vessel thrombosis, her planter aspect of foot and ankles areas are warm.Posterior tibial pulse barely palpable. Dorsalis pedis unable to locate due to swelling and wound. - will start heparin, avoid angiogram to save kidneys. - broaden antibiotic coverage to include flagyl. No localized collection to go for incision, has an open wound. - ABI - I called and discussed in details with Dr Trula Slade with vascular surgery who recommended doppler. Suspect acute on chronic ischemia. Vascular surgery to see patient.

## 2020-09-27 NOTE — Progress Notes (Signed)
VASCULAR LAB    Right lower extremity arterial duplex has been performed.  See CV proc for preliminary results.   Yonael Tulloch, RVT 09/27/2020, 6:40 PM

## 2020-09-27 NOTE — ED Notes (Signed)
Pt has scleral edema.  MD notified.  Also notified MD that pt has not voided.  He advised to straight cath to get urine sample.

## 2020-09-27 NOTE — ED Notes (Signed)
Stopped LR @ 133ml/h per order as pt had been switched to D5LR at 0242

## 2020-09-27 NOTE — Progress Notes (Signed)
ANTICOAGULATION CONSULT NOTE  Pharmacy Consult for Heparin Indication: ischemic foot  No Known Allergies  Patient Measurements: Height: 5\' 3"  (160 cm) Weight: 45.4 kg (100 lb) IBW/kg (Calculated) : 52.4  Vital Signs: Temp: 97.9 F (36.6 C) (10/02 1615) Temp Source: Oral (10/02 1615) BP: 115/61 (10/02 1615) Pulse Rate: 87 (10/02 1615)  Labs: Recent Labs    09/26/20 1534 09/26/20 1534 09/26/20 1634 09/26/20 1656 09/26/20 2245 09/27/20 0245  HGB 7.7*  --   --  7.8*  --   --   HCT 25.0*  --   --  23.0*  --   --   PLT 408*  --   --   --   --   --   CREATININE 3.63*   < >  --  3.50* 3.14* 3.10*  CKTOTAL  --   --  94  --   --   --    < > = values in this interval not displayed.    Estimated Creatinine Clearance: 13.8 mL/min (A) (by C-G formula based on SCr of 3.1 mg/dL (H)).   Medical History: Past Medical History:  Diagnosis Date  . Anemia of chronic renal failure     Assessment: 59 y/o F with a h/o renal and pancreatic transplant admitted with generalized weakness. Pharmacy consulted to initiate heparin drip for ischemic foot. Patient has anemia of chronic disease. Last dose of subcutaneous heparin was this AM.   Goal of Therapy:  Heparin level 0.3-0.7 units/ml Monitor platelets by anticoagulation protocol: Yes   Plan:  Give 2500 units bolus x 1 Start heparin infusion at 700 units/hr Check anti-Xa level in 8 hours and daily while on heparin Continue to monitor H&H and platelets  Ulice Dash D 09/27/2020,4:27 PM

## 2020-09-28 LAB — CBC WITH DIFFERENTIAL/PLATELET
Abs Immature Granulocytes: 0.08 10*3/uL — ABNORMAL HIGH (ref 0.00–0.07)
Basophils Absolute: 0 10*3/uL (ref 0.0–0.1)
Basophils Relative: 0 %
Eosinophils Absolute: 0 10*3/uL (ref 0.0–0.5)
Eosinophils Relative: 0 %
HCT: 24.3 % — ABNORMAL LOW (ref 36.0–46.0)
Hemoglobin: 7.7 g/dL — ABNORMAL LOW (ref 12.0–15.0)
Immature Granulocytes: 1 %
Lymphocytes Relative: 9 %
Lymphs Abs: 0.7 10*3/uL (ref 0.7–4.0)
MCH: 35.6 pg — ABNORMAL HIGH (ref 26.0–34.0)
MCHC: 31.7 g/dL (ref 30.0–36.0)
MCV: 112.5 fL — ABNORMAL HIGH (ref 80.0–100.0)
Monocytes Absolute: 0.5 10*3/uL (ref 0.1–1.0)
Monocytes Relative: 6 %
Neutro Abs: 6 10*3/uL (ref 1.7–7.7)
Neutrophils Relative %: 84 %
Platelets: 331 10*3/uL (ref 150–400)
RBC: 2.16 MIL/uL — ABNORMAL LOW (ref 3.87–5.11)
RDW: 17.9 % — ABNORMAL HIGH (ref 11.5–15.5)
WBC: 7.3 10*3/uL (ref 4.0–10.5)
nRBC: 0 % (ref 0.0–0.2)

## 2020-09-28 LAB — BASIC METABOLIC PANEL
Anion gap: 10 (ref 5–15)
BUN: 88 mg/dL — ABNORMAL HIGH (ref 6–20)
CO2: 19 mmol/L — ABNORMAL LOW (ref 22–32)
Calcium: 8 mg/dL — ABNORMAL LOW (ref 8.9–10.3)
Chloride: 104 mmol/L (ref 98–111)
Creatinine, Ser: 2.3 mg/dL — ABNORMAL HIGH (ref 0.44–1.00)
GFR calc Af Amer: 26 mL/min — ABNORMAL LOW (ref >60)
GFR calc non Af Amer: 22 mL/min — ABNORMAL LOW (ref >60)
Glucose, Bld: 124 mg/dL — ABNORMAL HIGH (ref 70–99)
Potassium: 5 mmol/L (ref 3.5–5.1)
Sodium: 133 mmol/L — ABNORMAL LOW (ref 135–145)

## 2020-09-28 LAB — PHOSPHORUS: Phosphorus: 3.2 mg/dL (ref 2.5–4.6)

## 2020-09-28 LAB — GLUCOSE, CAPILLARY
Glucose-Capillary: 248 mg/dL — ABNORMAL HIGH (ref 70–99)
Glucose-Capillary: 241 mg/dL — ABNORMAL HIGH (ref 70–99)
Glucose-Capillary: 108 mg/dL — ABNORMAL HIGH (ref 70–99)
Glucose-Capillary: 139 mg/dL — ABNORMAL HIGH (ref 70–99)

## 2020-09-28 LAB — MAGNESIUM: Magnesium: 1.9 mg/dL (ref 1.7–2.4)

## 2020-09-28 LAB — HEPARIN LEVEL (UNFRACTIONATED)
Heparin Unfractionated: 0.54 IU/mL (ref 0.30–0.70)
Heparin Unfractionated: 0.69 IU/mL (ref 0.30–0.70)

## 2020-09-28 MED ORDER — VANCOMYCIN HCL IN DEXTROSE 1-5 GM/200ML-% IV SOLN: 1000.0000 mg | Freq: Once | INTRAVENOUS | Status: DC

## 2020-09-28 MED ORDER — PIPERACILLIN-TAZOBACTAM 3.375 G IVPB 30 MIN: 3.3750 g | Freq: Once | INTRAVENOUS | Status: DC

## 2020-09-28 MED ORDER — CLINDAMYCIN PHOSPHATE 600 MG/50ML IV SOLN
600.0000 mg | Freq: Three times a day (TID) | INTRAVENOUS | Status: AC
  Administered 2020-09-28 – 2020-10-01 (×9): 600 mg via INTRAVENOUS
  Filled 2020-09-28 (×9): qty 50

## 2020-09-28 MED ORDER — SODIUM CHLORIDE 0.9 % IV SOLN
1.0000 g | INTRAVENOUS | Status: DC
  Filled 2020-09-28: qty 1

## 2020-09-28 MED ORDER — VANCOMYCIN HCL IN DEXTROSE 1-5 GM/200ML-% IV SOLN
1000.0000 mg | Freq: Once | INTRAVENOUS | Status: AC
  Administered 2020-09-28: 1000 mg via INTRAVENOUS
  Filled 2020-09-28: qty 200

## 2020-09-28 MED ORDER — VANCOMYCIN HCL 500 MG/100ML IV SOLN: 500.0000 mg | INTRAVENOUS | Status: DC

## 2020-09-28 MED ORDER — MORPHINE SULFATE (PF) 2 MG/ML IV SOLN
2.0000 mg | INTRAVENOUS | Status: DC | PRN
  Administered 2020-09-28 – 2020-10-04 (×18): 2 mg via INTRAVENOUS
  Filled 2020-09-28 (×18): qty 1

## 2020-09-28 MED ORDER — HEPARIN SODIUM (PORCINE) 5000 UNIT/ML IJ SOLN
5000.0000 [IU] | Freq: Three times a day (TID) | INTRAMUSCULAR | Status: DC
  Administered 2020-09-28 – 2020-09-30 (×7): 5000 [IU] via SUBCUTANEOUS
  Filled 2020-09-28 (×7): qty 1

## 2020-09-28 MED ORDER — SODIUM CHLORIDE 0.9 % IV SOLN
2.0000 g | Freq: Once | INTRAVENOUS | Status: AC
  Administered 2020-09-28: 2 g via INTRAVENOUS
  Filled 2020-09-28: qty 2

## 2020-09-28 MED ORDER — CHLORHEXIDINE GLUCONATE CLOTH 2 % EX PADS
6.0000 | MEDICATED_PAD | Freq: Every day | CUTANEOUS | Status: DC
  Administered 2020-09-29 – 2020-10-06 (×8): 6 via TOPICAL

## 2020-09-28 NOTE — Progress Notes (Signed)
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  She fell on the floor and could not get up by herself in the absence of her husband.  Her neighbor checked on her and called EMS. In the emergency room patient was hemodynamically stable.  Blood glucose 734 with anion gap and pH of 7.1.  Beta hydroxybutyrate more than 6.  Treated for DKA and admitted.  Patient also found with acute kidney injury on top of her chronic kidney disease. She has multiple diabetic ulcers, right foot ulcer was found to be gradually worsening with some ischemic changes on the tip of the toes. 10/3, discussed with vascular surgery and orthopedics.  She will need procedure on her right foot and probably need an amputation.  Orthopedic surgery recommended transfer to [facility] for Dr. Sharol Given to do procedure.  Assessment & Plan:   Principal Problem:   Diabetic ketoacidosis ([place]) Active Problems:   Anemia secondary to renal failure   History of simultaneous kidney and pancreas transplant ([place])   Chronic kidney disease (CKD) stage G3a/A1, moderately decreased glomerular filtration rate (GFR) between 45-59 mL/min/1.73 square meter and albuminuria creatinine ratio less than 30 mg/g (HCC)   Hypertension   Acute kidney injury superimposed on CKD (HCC)   Cellulitis and abscess of right leg   DKA, type 1 (HCC)   Pressure injury of skin  Diabetic ketoacidosis with history of type 1 diabetes: Anion gap closed.  Patient with normal appetite.  Insulin pump discontinued.  Started on low-dose long-acting insulin with sliding scale  coverage.  She has very low blood sugars.  We will keep on 14 units of Lantus at night and sensitive sliding scale for now. Patient does have outpatient endocrinologist who manages her insulin pump, however I suspect insulin pump is malfunctioning.  We will discharge her on subcu insulin until she has a follow-up once patient medically ready.  Acute kidney injury with history of stage IIIa chronic kidney disease, kidney pancreas transplant on immunosuppression therapy, anemia of chronic disease: Probably prerenal injury.  Some improvement on renal functions.  Monitor output.  Catheterization if needed. Renal ultrasound was essentially normal.  She had some debris on her urinary bladder.  Urine cultures are pending.  Discussed with patient's nephrology, will continue current doses of prednisone, cyclosporine and azathioprine.  Right leg cellulitis and abscess, chronic diabetic foot infection, pressure ulcer left foot: Patient has significant swelling and cellulitis of the right foot.  Was a started on Rocephin and Flagyl, patient continued to have worsening swelling.  Local cultures are pending.  Developing ischemic changes on and surrounding the wound.  Patient also has unstable foot with deformity and hardware present.  Patient with worsening swelling with fluctuation on dorsum of right foot.  Will broaden antibiotics to cover for MRSA and Pseudomonas as we will treat as necrotizing fasciitis. Case discussed with orthopedic surgery, Dr. Alvan Dame who recommended patient to be transferred to [facility] to schedule for debridement or possible transtibial amputation.  Patient needs definitive treatment or amputation of the right foot.  Hypertension: Lisinopril discontinued.  Continue metoprolol.  Physical debility: Work with PT OT.  Depends upon hospital  course.  Subconjunctival swelling/ chemosis: Due to hypoalbuminemia.  Consult nutrition.  Stabilizing today.  Severe peripheral vascular  disease, acute on chronic ischemic changes on right foot and toes: Lower extremity arterial studies with chronic arterial insufficiency. Ischemic changes of the right foot toes. Necrotizing infection of the right foot. Detailed discussion and examination at bedside along with vascular surgery.  Patient currently with acute kidney injury, transplanted kidney.  Right foot with chronic deformity, chronic nonhealing wound and instability.  Decided against contrasted studies.  She will probably best served with amputation to clean margin to help with mobility, prosthesis fitting. Was started on heparin overnight, does not look like acute ischemia or thrombosis, will discontinue.  Examined patient with vascular surgery Dr. Trula Slade at bedside, case discussed in detail with patient and called and updated patient's husband on the phone. Called and discussed with orthopedic surgery Dr. Alvan Dame about plan of care.   DVT prophylaxis: heparin injection 5,000 Units Start: 09/28/20 2200   Code Status: Full code Family Communication: Husband on the phone. Disposition Plan: Status is: Inpatient  Remains inpatient appropriate because:Inpatient level of care appropriate due to severity of illness   Dispo: The patient is from: Home              Anticipated d/c is to: Home with home health services.              Anticipated d/c date is: Unknown at this time.              Patient currently is not medically stable to d/c.         Consultants:   None.  Nephrology curbside  Vascular surgery  Orthopedic surgery  Procedures:   None  Antimicrobials:  Antibiotics Given (last 72 hours)    Date/Time Action Medication Dose Rate   09/27/20 1700 New Bag/Given   metroNIDAZOLE (FLAGYL) IVPB 500 mg 500 mg 100 mL/hr   09/27/20 2137 New Bag/Given   cefTRIAXone (ROCEPHIN) 1 g in sodium chloride 0.9 % 100 mL IVPB 1 g 200 mL/hr   09/28/20 0047 New Bag/Given   metroNIDAZOLE (FLAGYL) IVPB 500 mg 500 mg 100  mL/hr   09/28/20 1031 New Bag/Given   ceFEPIme (MAXIPIME) 2 g in sodium chloride 0.9 % 100 mL IVPB 2 g 200 mL/hr   09/28/20 1156 New Bag/Given   vancomycin (VANCOCIN) IVPB 1000 mg/200 mL premix 1,000 mg 200 mL/hr   09/28/20 1256 New Bag/Given   clindamycin (CLEOCIN) IVPB 600 mg 600 mg 100 mL/hr     Anti-infectives (From admission, onward)   Start     Dose/Rate Route Frequency Ordered Stop   09/30/20 0000  vancomycin (VANCOREADY) IVPB 500 mg/100 mL        500 mg 100 mL/hr over 60 Minutes Intravenous Every 36 hours 09/28/20 1215     09/29/20 2300  vancomycin (VANCOREADY) IVPB 500 mg/100 mL  Status:  Discontinued        500 mg 100 mL/hr over 60 Minutes Intravenous Every 36 hours 09/28/20 1025 09/28/20 1244   09/29/20 1000  ceFEPIme (MAXIPIME) 1 g in sodium chloride 0.9 % 100 mL IVPB        1 g 200 mL/hr over 30 Minutes Intravenous Every 24 hours 09/28/20 1048     09/28/20 1200  clindamycin (CLEOCIN) IVPB 600 mg        600 mg 100 mL/hr over 30 Minutes Intravenous Every 8 hours 09/28/20 0919 10/01/20 1359   09/28/20 1100  vancomycin (VANCOCIN) IVPB 1000  mg/200 mL premix        1,000 mg 200 mL/hr over 60 Minutes Intravenous  Once 09/28/20 1023 09/28/20 1256   09/28/20 0945  ceFEPIme (MAXIPIME) 2 g in sodium chloride 0.9 % 100 mL IVPB        2 g 200 mL/hr over 30 Minutes Intravenous  Once 09/28/20 0937 09/28/20 1101   09/28/20 0930  vancomycin (VANCOCIN) IVPB 1000 mg/200 mL premix  Status:  Discontinued        1,000 mg 200 mL/hr over 60 Minutes Intravenous  Once 09/28/20 0918 09/28/20 0919   09/28/20 0930  vancomycin (VANCOCIN) IVPB 1000 mg/200 mL premix  Status:  Discontinued        1,000 mg 200 mL/hr over 60 Minutes Intravenous  Once 09/28/20 0919 09/28/20 1021   09/28/20 0930  piperacillin-tazobactam (ZOSYN) IVPB 3.375 g  Status:  Discontinued        3.375 g 100 mL/hr over 30 Minutes Intravenous  Once 09/28/20 0919 09/28/20 0937   09/27/20 1700  metroNIDAZOLE (FLAGYL) IVPB 500 mg   Status:  Discontinued        500 mg 100 mL/hr over 60 Minutes Intravenous Every 8 hours 09/27/20 1616 09/28/20 0917   09/26/20 2245  cefTRIAXone (ROCEPHIN) 1 g in sodium chloride 0.9 % 100 mL IVPB  Status:  Discontinued        1 g 200 mL/hr over 30 Minutes Intravenous Every 24 hours 09/26/20 2244 09/28/20 0917         Subjective: Patient seen and examined.  No overnight events.  She was hungry and waiting for breakfast on my evaluation.  Does not have much pain on her foot, however right foot looks very bad as per the patient.  Afebrile overnight.  Blood sugars were low normal overnight. Patient agrees that her right foot is very bad and has been giving her trouble.  I called and updated patient's husband, we discussed about different options for treatment of the right foot in the context of ongoing ischemia, immobility, dysfunctional and deformed right foot and probably she will end up getting amputation.  He understands the severity of condition.  Objective: Vitals:   09/27/20 1615 09/27/20 2022 09/28/20 0500 09/28/20 0510  BP: 115/61 126/63  (!) 164/86  Pulse: 87 80  85  Resp: 14 16  18   Temp: 97.9 F (36.6 C) 98.4 F (36.9 C)  99.4 F (37.4 C)  TempSrc: Oral Oral  Oral  SpO2: 95% 93%  92%  Weight:   47.4 kg   Height:        Intake/Output Summary (Last 24 hours) at 09/28/2020 1403 Last data filed at 09/28/2020 0519 Gross per 24 hour  Intake 1726.34 ml  Output --  Net 1726.34 ml   Filed Weights   09/26/20 1452 09/28/20 0500  Weight: 45.4 kg 47.4 kg    Examination:  General exam: Chronically sick looking, not in any distress, anxious.  On room air. Looks fairly comfortable today. Respiratory system: Clear to auscultation. Respiratory effort normal.  No added sounds. Cardiovascular system: S1 & S2 heard, RRR. No JVD, murmurs. Gastrointestinal system: Abdomen is nondistended, soft and nontender. No organomegaly or masses felt. Normal bowel sounds heard. Central  nervous system: Alert and oriented. No focal neurological deficits.  Generalized weakness. Extremities: Symmetric 5 x 5 power. Skin: Multiple ecchymosis. Both eyes with chemosis, less pronounced today. Unstageable pressure ulcer present on admission on the left foot. Right foot with boggy swelling, necrotic margins and purple patches  along with purple discoloration of the toes.  Pictures taken with patient permission.         Data Reviewed: I have personally reviewed following labs and imaging studies  CBC: Recent Labs  Lab 09/26/20 1534 09/26/20 1656 09/28/20 0215  WBC 15.5*  --  7.3  NEUTROABS 14.0*  --  6.0  HGB 7.7* 7.8* 7.7*  HCT 25.0* 23.0* 24.3*  MCV 114.7*  --  112.5*  PLT 408*  --  401   Basic Metabolic Panel: Recent Labs  Lab 09/26/20 1534 09/26/20 1656 09/26/20 2245 09/27/20 0245 09/27/20 0432 09/28/20 0215  NA 124* 124* 131* 133*  --  133*  K 5.7* 5.7* 4.8 4.8  --  5.0  CL 91* 94* 97* 99  --  104  CO2 9*  --  18* 20*  --  19*  GLUCOSE 734* 679* 395* 211*  --  124*  BUN 106* 115* 99* 91*  --  88*  CREATININE 3.63* 3.50* 3.14* 3.10*  --  2.30*  CALCIUM 8.2*  --  8.3* 8.1*  --  8.0*  MG  --   --   --   --  1.8 1.9  PHOS  --   --   --   --  4.4 3.2   GFR: Estimated Creatinine Clearance: 19.5 mL/min (A) (by C-G formula based on SCr of 2.3 mg/dL (H)). Liver Function Tests: Recent Labs  Lab 09/26/20 1534  AST 22  ALT 23  ALKPHOS 96  BILITOT 2.5*  PROT 5.4*  ALBUMIN 2.0*   No results for input(s): LIPASE, AMYLASE in the last 168 hours. No results for input(s): AMMONIA in the last 168 hours. Coagulation Profile: No results for input(s): INR, PROTIME in the last 168 hours. Cardiac Enzymes: Recent Labs  Lab 09/26/20 1634  CKTOTAL 94   BNP (last 3 results) No results for input(s): PROBNP in the last 8760 hours. HbA1C: No results for input(s): HGBA1C in the last 72 hours. CBG: Recent Labs  Lab 09/27/20 1613 09/27/20 1835 09/27/20 2052  09/28/20 0737 09/28/20 1140  GLUCAP 50* 70 106* 248* 241*   Lipid Profile: No results for input(s): CHOL, HDL, LDLCALC, TRIG, CHOLHDL, LDLDIRECT in the last 72 hours. Thyroid Function Tests: No results for input(s): TSH, T4TOTAL, FREET4, T3FREE, THYROIDAB in the last 72 hours. Anemia Panel: No results for input(s): VITAMINB12, FOLATE, FERRITIN, TIBC, IRON, RETICCTPCT in the last 72 hours. Sepsis Labs: Recent Labs  Lab 09/26/20 1535  LATICACIDVEN 1.3    Recent Results (from the past 240 hour(s))  Culture, blood (routine x 2)     Status: None (Preliminary result)   Collection Time: 09/26/20  4:45 PM   Specimen: BLOOD  Result Value Ref Range Status   Specimen Description   Final    BLOOD LEFT ANTECUBITAL Performed at [facility] [address], [place], [place] [address]    Special Requests   Final    BOTTLES DRAWN AEROBIC AND ANAEROBIC Blood Culture adequate volume Performed at [facility] [address] [place], [place] [address]    Culture   Final    NO GROWTH 2 DAYS Performed at [facility] [address] [place], [place] [address]    Report Status PENDING  Incomplete  Culture, blood (routine x 2)     Status: None (Preliminary result)   Collection Time: 09/26/20  4:55 PM   Specimen: BLOOD RIGHT FOREARM  Result Value Ref Range Status   Specimen Description  Final    BLOOD RIGHT FOREARM Performed at [facility] [address], [place], [place] [address]    Special Requests   Final    BOTTLES DRAWN AEROBIC AND ANAEROBIC Blood Culture results may not be optimal due to an inadequate volume of blood received in culture bottles Performed at [facility] [address] [place], [place] [address]    Culture   Final    NO GROWTH 2 DAYS Performed at [facility] [address], [place], [place] [address]    Report Status PENDING  Incomplete  Respiratory Panel by RT PCR (Flu  A&B, Covid) - Nasopharyngeal Swab     Status: None   Collection Time: 09/26/20  5:06 PM   Specimen: Nasopharyngeal Swab  Result Value Ref Range Status   SARS Coronavirus 2 by RT PCR NEGATIVE NEGATIVE Final    Comment: (NOTE) SARS-CoV-2 target nucleic acids are NOT DETECTED.  The SARS-CoV-2 RNA is generally detectable in upper respiratoy specimens during the acute phase of infection. The lowest concentration of SARS-CoV-2 viral copies this assay can detect is 131 copies/mL. A negative result does not preclude SARS-Cov-2 infection and should not be used as the sole basis for treatment or other patient management decisions. A negative result may occur with  improper specimen collection/handling, submission of specimen other than nasopharyngeal swab, presence of viral mutation(s) within the areas targeted by this assay, and inadequate number of viral copies (<131 copies/mL). A negative result must be combined with clinical observations, patient history, and epidemiological information. The expected result is Negative.  Fact Sheet for Patients:  PinkCheek.be  Fact Sheet for Healthcare Providers:  GravelBags.it  This test is no t yet approved or cleared by the [place] FDA and  has been authorized for detection and/or diagnosis of SARS-CoV-2 by FDA under an Emergency Use Authorization (EUA). This EUA will remain  in effect (meaning this test can be used) for the duration of the COVID-19 declaration under Section 564(b)(1) of the Act, 21 U.S.C. section 360bbb-3(b)(1), unless the authorization is terminated or revoked sooner.     Influenza A by PCR NEGATIVE NEGATIVE Final   Influenza B by PCR NEGATIVE NEGATIVE Final    Comment: (NOTE) The Xpert Xpress SARS-CoV-2/FLU/RSV assay is intended as an aid in  the diagnosis of influenza from Nasopharyngeal swab specimens and  should not be used as a sole basis for treatment. Nasal  washings and  aspirates are unacceptable for Xpert Xpress SARS-CoV-2/FLU/RSV  testing.  Fact Sheet for Patients: PinkCheek.be  Fact Sheet for Healthcare Providers: GravelBags.it  This test is not yet approved or cleared by the [place] FDA and  has been authorized for detection and/or diagnosis of SARS-CoV-2 by  FDA under an Emergency Use Authorization (EUA). This EUA will remain  in effect (meaning this test can be used) for the duration of the  Covid-19 declaration under Section 564(b)(1) of the Act, 21  U.S.C. section 360bbb-3(b)(1), unless the authorization is  terminated or revoked. Performed at [facility], [place] [address], [place], [place] [address]   Aerobic Culture (superficial specimen)     Status: None (Preliminary result)   Collection Time: 09/26/20  5:34 PM   Specimen: Foot; Wound  Result Value Ref Range Status   Specimen Description   Final    FOOT RIGHT Performed at [facility] [address] [place], [place] [address]    Special Requests   Final  NONE Performed at [facility], [place] [address], [place], [place] [address]    Gram Stain   Final    NO WBC SEEN MODERATE GRAM NEGATIVE RODS Performed at [facility], [place] [address], [place], [place] [address]    Culture   Final    ABUNDANT SERRATIA MARCESCENS MODERATE PSEUDOMONAS AERUGINOSA    Report Status PENDING  Incomplete  MRSA PCR Screening     Status: None   Collection Time: 09/27/20  4:32 AM   Specimen: Nasal Mucosa; Nasopharyngeal  Result Value Ref Range Status   MRSA by PCR NEGATIVE NEGATIVE Final    Comment:        The GeneXpert MRSA Assay (FDA approved for NASAL specimens only), is one component of a comprehensive MRSA colonization surveillance program. It is not intended to diagnose MRSA infection nor to guide or monitor treatment for MRSA  infections. Performed at [facility], [place] [address], [place], [place] [address]          Radiology Studies: DG Chest 1 View  Result Date: 09/26/2020 CLINICAL DATA:  Found on the floor. EXAM: CHEST  1 VIEW COMPARISON:  None. FINDINGS: Mild, diffuse, chronic appearing increased lung markings are seen. There is no evidence of acute infiltrate, pleural effusion or pneumothorax. The heart size and mediastinal contours are within normal limits. Radiopaque surgical clips are seen within the soft tissues of the lateral left chest wall. A chronic deformity of the sixth right rib is seen. IMPRESSION: No acute cardiopulmonary disease. Electronically Signed   By: Virgina Norfolk M.D.   On: 09/26/2020 18:11   CT Head Wo Contrast  Result Date: 09/26/2020 CLINICAL DATA:  Found on the floor. EXAM: CT HEAD WITHOUT CONTRAST TECHNIQUE: Contiguous axial images were obtained from the base of the skull through the vertex without intravenous contrast. COMPARISON:  None. FINDINGS: Brain: There is mild cerebral atrophy with widening of the extra-axial spaces and ventricular dilatation. There are areas of decreased attenuation within the white matter tracts of the supratentorial brain, consistent with microvascular disease changes. Vascular: No hyperdense vessel or unexpected calcification. Skull: Normal. Negative for fracture or focal lesion. Sinuses/Orbits: No acute finding. Other: None. IMPRESSION: 1. Generalized cerebral atrophy. 2. No acute intracranial abnormality. Electronically Signed   By: Virgina Norfolk M.D.   On: 09/26/2020 18:21   US RENAL  Result Date: 09/27/2020 CLINICAL DATA:  Acute kidney injury. EXAM: RENAL / URINARY TRACT ULTRASOUND COMPLETE COMPARISON:  None. FINDINGS: Right Kidney: Not visualized. Left Kidney: Not visualized. Bladder: Hypoechoic area measuring 3.5 x 2.7 x 0.9 cm is noted posteriorly in the urinary bladder which may represent debris, but possible mass cannot be  excluded. Cystoscopy is recommended for further evaluation. Other: Small bilateral pleural effusions are noted incidentally. IMPRESSION: 1. Kidneys are not visualized. 2. 3.5 x 2.7 x 0.9 cm hypoechoic area is noted posteriorly in the urinary bladder which may represent debris, but possible mass cannot be excluded. Cystoscopy is recommended for further evaluation. Electronically Signed   By: Marijo Conception M.D.   On: 09/27/2020 16:47   DG Foot Complete Right  Result Date: 09/26/2020 CLINICAL DATA:  Right foot wound. EXAM: RIGHT FOOT COMPLETE - 3+ VIEW COMPARISON:  None. FINDINGS: There is diffuse osteopenia. There is no evidence of acute fracture or dislocation. A radiopaque intramedullary rod is seen extending through the distal right tibia into the right talus and right calcaneus. Associated chronic deformities are seen within these regions. A chronic deformity  of the distal left fibula is also noted. A radiopaque surgical screw is seen overlying the interphalangeal joint of the right great toe. A thin, 1.3 cm linear radiopaque soft tissue foreign body is seen adjacent to the plantar aspect of the fifth right metatarsal. IMPRESSION: 1. Chronic and postoperative changes, as described above, without evidence of an acute fracture. Electronically Signed   By: Virgina Norfolk M.D.   On: 09/26/2020 18:16   VAS [place] ABI WITH/WO TBI  Result Date: 09/27/2020 LOWER EXTREMITY DOPPLER STUDY Indications: Rest pain, and ulceration. High Risk Factors: Diabetes. Other Factors: History of pancreatic and renal transplant.  Limitations: Today's exam was limited due to patient positioning and bandages. Comparison Study: No prior study on file for comparison Performing Technologist: Sharion Dove RVS  Examination Guidelines: A complete evaluation includes at minimum, Doppler waveform signals and systolic blood pressure reading at the level of bilateral brachial, anterior tibial, and posterior tibial arteries, when vessel  segments are accessible. Bilateral testing is considered an integral part of a complete examination. Photoelectric Plethysmograph (PPG) waveforms and toe systolic pressure readings are included as required and additional duplex testing as needed. Limited examinations for reoccurring indications may be performed as noted.  ABI Findings: +---------+------------------+-----+-------------------+--------+ Right    Rt Pressure (mmHg)IndexWaveform           Comment  +---------+------------------+-----+-------------------+--------+ Brachial 127                    multiphasic                 +---------+------------------+-----+-------------------+--------+ PTA      244               1.78 dampened monophasic         +---------+------------------+-----+-------------------+--------+ DP       80                0.58 dampened monophasic         +---------+------------------+-----+-------------------+--------+ Great Toe0                 0.00                             +---------+------------------+-----+-------------------+--------+ +---------+------------------+-----+-----------+-------+ Left     Lt Pressure (mmHg)IndexWaveform   Comment +---------+------------------+-----+-----------+-------+ Brachial 137                    multiphasic        +---------+------------------+-----+-----------+-------+ PTA      77                0.56 monophasic         +---------+------------------+-----+-----------+-------+ DP       254               1.85 monophasic         +---------+------------------+-----+-----------+-------+ Great Toe29                0.21                    +---------+------------------+-----+-----------+-------+ +-------+-----------+-----------+------------+------------+ ABI/TBIToday's ABIToday's TBIPrevious ABIPrevious TBI +-------+-----------+-----------+------------+------------+ Right  1.78       0                                    +-------+-----------+-----------+------------+------------+ Left   1.85       0.21                                +-------+-----------+-----------+------------+------------+  Arterial wall calcification precludes accurate ankle pressures and ABIs.  Summary: Right: Resting right ankle-brachial index indicates noncompressible right lower extremity arteries. The right toe-brachial index is abnormal. Left: Resting left ankle-brachial index indicates noncompressible left lower extremity arteries. The left toe-brachial index is abnormal.  *See table(s) above for measurements and observations.     Preliminary    VAS [place] LOWER EXTREMITY ARTERIAL DUPLEX  Result Date: 09/27/2020 LOWER EXTREMITY ARTERIAL DUPLEX STUDY Indications: Rest pain, ulceration, and purple, cool foot. High Risk Factors: Diabetes. Other Factors: History of pancreatic and renal transplant.  Current ABI: B: non compressible with abnormal TBIs Limitations: Positioning, foot deformity, body habitus Comparison Study: No prior study on file for comparison Performing Technologist: Sharion Dove RVS  Examination Guidelines: A complete evaluation includes B-mode imaging, spectral Doppler, color Doppler, and power Doppler as needed of all accessible portions of each vessel. Bilateral testing is considered an integral part of a complete examination. Limited examinations for reoccurring indications may be performed as noted.  +----------+--------+-----+---------------+----------+--------+ RIGHT     PSV cm/sRatioStenosis       Waveform  Comments +----------+--------+-----+---------------+----------+--------+ CFA Prox  79                          biphasic           +----------+--------+-----+---------------+----------+--------+ DFA       127                         biphasic           +----------+--------+-----+---------------+----------+--------+ SFA Prox  33                          monophasic          +----------+--------+-----+---------------+----------+--------+ SFA Mid   232          50-74% stenosismonophasic         +----------+--------+-----+---------------+----------+--------+ SFA Distal33                          monophasic         +----------+--------+-----+---------------+----------+--------+ POP Prox  55                          monophasic         +----------+--------+-----+---------------+----------+--------+ POP Distal287          50-74% stenosismonophasic         +----------+--------+-----+---------------+----------+--------+ ATA Prox  33                          monophasic         +----------+--------+-----+---------------+----------+--------+ ATA Mid   38                          monophasic         +----------+--------+-----+---------------+----------+--------+ ATA Distal40                          monophasic         +----------+--------+-----+---------------+----------+--------+ PTA Mid   17                          monophasic         +----------+--------+-----+---------------+----------+--------+ PTA Distal10  monophasic         +----------+--------+-----+---------------+----------+--------+ A focal velocity elevation of was obtained at Mid SFA. Findings are characteristic of 50-74% stenosis. A 2nd focal velocity elevation was visualized, measuring at Distal popliteal. Findings are characteristic of 50-74% stenosis.  Summary: Right: 50-74% stenosis noted in the superficial femoral artery. 50-74% stenosis noted in the popliteal artery. Diffuse calcific plaque noted throughout with abnormal waveforms.  See table(s) above for measurements and observations.    Preliminary         Scheduled Meds: . atorvastatin  10 mg Oral Daily  . azaTHIOprine  50 mg Oral Daily  . cycloSPORINE  75 mg Oral BID  . heparin injection (subcutaneous)  5,000 Units Subcutaneous Q8H  . insulin aspart  0-5 Units Subcutaneous QHS    . insulin aspart  0-6 Units Subcutaneous TID WC  . insulin detemir  0.3 Units/kg Subcutaneous Q24H  . levothyroxine  50 mcg Oral QAC breakfast  . metoprolol tartrate  12.5 mg Oral Daily  . predniSONE  5 mg Oral Q breakfast  . sertraline  25 mg Oral Daily   Continuous Infusions: . [START ON 09/29/2020] ceFEPime (MAXIPIME) IV    . clindamycin (CLEOCIN) IV 600 mg (09/28/20 1256)  . lactated ringers 100 mL/hr at 09/27/20 2125  . [START ON 09/30/2020] vancomycin       LOS: 2 days    Time spent: 48 minutes    Barb Merino, MD [facility] Pager [phone]

## 2020-09-28 NOTE — Evaluation (Signed)
Occupational Therapy Evaluation Patient Details Name: [name] MRN: [id number] DOB: Sep 22, 1961 Today's Date: 09/28/2020    History of Present Illness 59 yo female admitted s/p fall and acute kidney injury. pt's R leg has cellulitis and abscesses and chronic diabetic foot infection . PMH DM type 1 with insulin pump, diabetic foot ulcers CKDIII with hx of pancreatic and renal transplant on immunosuppression therapy and chronic prednisone   Clinical Impression   PT admitted with s/p fall with acute kidney injury. Pt currently with functional limitiations due to the deficits listed below (see OT problem list). Pt currently requires total +2 max (A) for bed level mobility. Pt with early signs of skin break down on sacrum and recommend bed mattress overlay(air mattress). Pt with decreased (A) at home so SNF recommendation. Pt with skin change to R LE with pending workup.  Pt will benefit from skilled OT to increase their independence and safety with adls and balance to allow discharge SNF.     Follow Up Recommendations  SNF    Equipment Recommendations  Wheelchair cushion (measurements OT);Wheelchair (measurements OT);Hospital bed    Recommendations for Other Services Other (comment) (palliative consult ?)     Precautions / Restrictions Precautions Precautions: Fall Precaution Comments: risk for skin break down Restrictions Weight Bearing Restrictions: No      Mobility Bed Mobility Overal bed mobility: Needs Assistance Bed Mobility: Supine to Sit;Sit to Supine     Supine to sit: +2 for physical assistance;Max assist Sit to supine: +2 for physical assistance;Max assist   General bed mobility comments: pt with helicopter method used with pad to progress to EOB due to generalized weakness and back pain. pt with total (A) at EOB. pt initates trunk activation but unable to sustain. pt with dependence of bil LE on PT to position at EOB. pt eob dangle this session  only  Transfers                 General transfer comment: defer at this time    Balance Overall balance assessment: Needs assistance Sitting-balance support: Bilateral upper extremity supported;Feet supported Sitting balance-Leahy Scale: Zero                                     ADL either performed or assessed with clinical judgement   ADL Overall ADL's : Needs assistance/impaired Eating/Feeding: Maximal assistance   Grooming: Maximal assistance   Upper Body Bathing: Total assistance   Lower Body Bathing: Total assistance   Upper Body Dressing : Total assistance   Lower Body Dressing: Total assistance Lower Body Dressing Details (indicate cue type and reason): does not lift leg at all to hlep with sock don               General ADL Comments: pt incontinence of bowel and unaware. pt with skin break down noted at sacrum     Vision   Additional Comments: noted to have change in color of L eye compared to R eye and slightly larger      Perception     Praxis      Pertinent Vitals/Pain Pain Assessment: Faces Faces Pain Scale: Hurts even more Pain Location: back Pain Descriptors / Indicators: Grimacing;Guarding;Discomfort Pain Intervention(s): Monitored during session;Premedicated before session;Repositioned     Hand Dominance Right   Extremity/Trunk Assessment Upper Extremity Assessment Upper Extremity Assessment: Generalized weakness;RUE deficits/detail RUE Deficits / Details: noted to have edema at  elbow. Pt could benefit from elevate management with pillows as much as possible   Lower Extremity Assessment Lower Extremity Assessment: RLE deficits/detail;LLE deficits/detail RLE Deficits / Details: purple color with ankle inverted positioning edema present not active movement this session LLE Deficits / Details: noted to have edema in calf and foot area, multiple ulcers noted on foot   Cervical / Trunk Assessment Cervical / Trunk  Assessment: Other exceptions Cervical / Trunk Exceptions: R lateral curve to spine in static sitting eob reports pain in back    Communication Communication Communication: No difficulties   Cognition Arousal/Alertness: Awake/alert Behavior During Therapy: Flat affect Overall Cognitive Status: No family/caregiver present to determine baseline cognitive functioning                                 General Comments: pt reports hearing her husband in room and only therapists present. Pt states "oh yeah" pt making comments that are not related to questioning or unprompted    General Comments  noted to have break down at sacrum. pt positioned on L hip with pillow to get pressure off area. pt at end of session states "this is so much better    Exercises     Shoulder Instructions      Home Living Family/patient expects to be discharged to:: Skilled nursing facility                                 Additional Comments: pt reports multiple falls at home one being from commode      Prior Functioning/Environment Level of Independence: Needs assistance    ADL's / Homemaking Assistance Needed: pt states "its been difficult"            OT Problem List: Decreased activity tolerance;Impaired balance (sitting and/or standing);Decreased cognition;Decreased knowledge of use of DME or AE;Decreased knowledge of precautions;Cardiopulmonary status limiting activity;Obesity;Pain;Impaired UE functional use;Decreased safety awareness;Decreased strength;Increased edema      OT Treatment/Interventions: Self-care/ADL training;Therapeutic exercise;Energy conservation;DME and/or AE instruction;Manual therapy;Modalities;Therapeutic activities;Cognitive remediation/compensation;Patient/family education;Balance training    OT Goals(Current goals can be found in the care plan section) Acute Rehab OT Goals Patient Stated Goal: to get breakfast OT Goal Formulation: With patient Time  For Goal Achievement: 10/12/20 Potential to Achieve Goals: Good  OT Frequency: Min 2X/week   Barriers to D/C: Decreased caregiver support  falling at home alone and neighbor calling EMS       Co-evaluation PT/OT/SLP Co-Evaluation/Treatment: Yes Reason for Co-Treatment: To address functional/ADL transfers;For patient/therapist safety;Necessary to address cognition/behavior during functional activity   OT goals addressed during session: ADL's and self-care;Proper use of Adaptive equipment and DME      AM-PAC OT "6 Clicks" Daily Activity     Outcome Measure Help from another person eating meals?: A Lot Help from another person taking care of personal grooming?: A Lot Help from another person toileting, which includes using toliet, bedpan, or urinal?: Total Help from another person bathing (including washing, rinsing, drying)?: Total Help from another person to put on and taking off regular upper body clothing?: A Lot Help from another person to put on and taking off regular lower body clothing?: Total 6 Click Score: 9   End of Session Nurse Communication: Mobility status;Precautions  Activity Tolerance: Patient tolerated treatment well Patient left: in bed;with call bell/phone within reach;with bed alarm set;with nursing/sitter in room  OT Visit  Diagnosis: Unsteadiness on feet (R26.81);Pain                Time: 6962-9528 OT Time Calculation (min): 25 min Charges:  OT General Charges $OT Visit: 1 Visit OT Evaluation $OT Eval Moderate Complexity: 1 Mod   Brynn, OTR/L  Acute Rehabilitation Services Pager: [phone] Office: [phone] .   Jeri Modena 09/28/2020, 10:29 AM

## 2020-09-28 NOTE — Progress Notes (Signed)
Patient complain of coughing and chocking sensation when eating; states this has been occurring for several weeks at home prior to this admission. Attending MD notified; Orders for swallow eval requested.

## 2020-09-28 NOTE — Progress Notes (Addendum)
ANTICOAGULATION CONSULT NOTE  Pharmacy Consult for Heparin Indication: ischemic foot  No Known Allergies  Patient Measurements: Height: 5\' 3"  (160 cm) Weight: 45.4 kg (100 lb) IBW/kg (Calculated) : 52.4  Vital Signs: Temp: 98.4 F (36.9 C) (10/02 2022) Temp Source: Oral (10/02 2022) BP: 126/63 (10/02 2022) Pulse Rate: 80 (10/02 2022)  Labs: Recent Labs    09/26/20 1534 09/26/20 1534 09/26/20 1634 09/26/20 1656 09/26/20 1656 09/26/20 2245 09/27/20 0245 09/28/20 0215  HGB 7.7*   < >  --  7.8*  --   --   --  7.7*  HCT 25.0*  --   --  23.0*  --   --   --  24.3*  PLT 408*  --   --   --   --   --   --  331  HEPARINUNFRC  --   --   --   --   --   --   --  0.54  CREATININE 3.63*   < >  --  3.50*   < > 3.14* 3.10* 2.30*  CKTOTAL  --   --  94  --   --   --   --   --    < > = values in this interval not displayed.    Estimated Creatinine Clearance: 18.6 mL/min (A) (by C-G formula based on SCr of 2.3 mg/dL (H)).   Medical History: Past Medical History:  Diagnosis Date  . Anemia of chronic renal failure     Assessment: 59 y/o F with a h/o renal and pancreatic transplant admitted with generalized weakness. Pharmacy consulted to initiate heparin drip for ischemic foot. Patient has anemia of chronic disease. Last dose of subcutaneous heparin was this AM.   09/28/20  0215 HL = 0.54 with heparin gtt @ 700 units/hr  Hgb low but stable  No complications of therapy noted  Goal of Therapy:  Heparin level 0.3-0.7 units/ml Monitor platelets by anticoagulation protocol: Yes   Plan:   Continue IV heparin @ 700 units/hr  Check heparin level in 8 hours to confirm therapeutic dose  Follow heparin level and CBC daily  Emmaus Brandi, Toribio Harbour, PharmD 09/28/2020,3:10 AM

## 2020-09-28 NOTE — Progress Notes (Signed)
Pharmacy Antibiotic Note  [name] is a 59 y.o. female admitted on 09/26/2020 with cellulitis.  Pharmacy has been consulted for vancomycin & cefepime dosing. Zosyn changed to cefepime to avoid nephrotoxicity Pt on Imuran/cyclosprorine/prednisone for pancreatic & renal transplant AKI on CKD SCr 3.63 on admit, down to 2.3 today WBC 15.5> 7.3 Afebrile   Plan: Cefepime 2 gm x 1 then 1 gm IV q24   Clindamycin 600 mg IV q8h x 3 days per MD- f/u if need anaerobic coverage after 3 days Vancomycin 1000 mg IV x 1 loading dose Vancomycin 500 IV every 36 hours.  Goal trough 15-20 mcg/mL.   AUC 475, TBW Vd 0.72 F/u renal function, WBC, temp, culture data Vancomycin levels as needed  Height: 5\' 3"  (160 cm) Weight: 47.4 kg (104 lb 8 oz) IBW/kg (Calculated) : 52.4  Temp (24hrs), Avg:98.5 F (36.9 C), Min:97.9 F (36.6 C), Max:99.4 F (37.4 C)  Recent Labs  Lab 09/26/20 1534 09/26/20 1535 09/26/20 1656 09/26/20 2245 09/27/20 0245 09/28/20 0215  WBC 15.5*  --   --   --   --  7.3  CREATININE 3.63*  --  3.50* 3.14* 3.10* 2.30*  LATICACIDVEN  --  1.3  --   --   --   --     Estimated Creatinine Clearance: 19.5 mL/min (A) (by C-G formula based on SCr of 2.3 mg/dL (H)).    No Known Allergies Antimicrobials this admission:  10/2 CTX> 10/3 10/2 flagyl >> 10/3 10/3 vanc>> 10/3 cefepime>> 10/3 clinda x 3 days>> Dose adjustments this admission:   Microbiology results:  10/2 MRSA neg 10/1 R foot superficial Cx: abundant GNRs 10/1 covid/flu A/B neg 10/1 BCx2: ngtd 10/1 HIV NR  Thank you for allowing pharmacy to be a part of this patient's care.  Eudelia Bunch, Pharm.D 09/28/2020 9:59 AM

## 2020-09-28 NOTE — Progress Notes (Deleted)
     Subjective:      Patient reports pain as mild, pain controlled.  Slight pain with motion of the left hip.  No reported events throughout the night.  Discussed the procedure and expectations moving forward.      Objective:   VITALS:   Vitals:   09/27/20 2022 09/28/20 0510  BP: 126/63 (!) 164/86  Pulse: 80 85  Resp: 16 18  Temp: 98.4 F (36.9 C) 99.4 F (37.4 C)  SpO2: 93% 92%    Dorsiflexion/Plantar flexion intact Incision: dressing C/D/I No cellulitis present Compartment soft  LABS Recent Labs    09/26/20 1534 09/26/20 1656 09/28/20 0215  HGB 7.7* 7.8* 7.7*  HCT 25.0* 23.0* 24.3*  WBC 15.5*  --  7.3  PLT 408*  --  331    Recent Labs    09/26/20 2245 09/27/20 0245 09/28/20 0215  NA 131* 133* 133*  K 4.8 4.8 5.0  BUN 99* 91* 88*  CREATININE 3.14* 3.10* 2.30*  GLUCOSE 395* 211* 124*     Assessment/Plan:      Up with therapy Discharge disposition TBD        Danae Orleans PA-C  [facility]  [facility] [address], [address], [place], [place] [address] Phone: [phone] [email or web address] Facebook  [employer]

## 2020-09-28 NOTE — Consult Note (Addendum)
WOC Nurse Consult Note: Reason for Consult: Purple discoloration of poorly functioning foot with wound. Photos on EMR are appreciated. Consulted simultaneously with Vascular and Vascular is to see patient today. WOC Nursing will defer to those providers for guidance in the care of the right extremity. I have discussed the patient with her Bedside RN (E. A. Lea) this morning.  The sacrum presents with a linear break in the integument measuring 1cm x 0.25cm x 0.1cm that is likely due to moisture vs pressure. A sacral foam dressing is in place.  The left lateral and posterior heel present with an 8cm x 6cm area of purple discoloration that encompasses two breaks in the integument: more distally, a 1cm x 0.5cm x 0.2cm full thickness ulceration that is chronic and more proximally, a 1cm x 2cm skin tear that can be approximated.  The right anterior foot lesion can be address with a xeroform gauze dressing topped with a silicone foam until Vascular assesses the foot.  Conservative care guidence is provided for Nursing via the Orders using xeroform gauze as a wound contact layer to the foot lesions topped with dry gauze and secured with silicone foam. The left foot is to be placed into a pressure redistribution heel boot. The sacral moisture lesion already has a silicone foam in place and the patient is assisted to lie in the side lying position.  [place] nursing team will not follow, but will remain available to this patient, the nursing and medical teams.  Please re-consult if needed. Thanks, Maudie Flakes, MSN, RN, [place], Arther Abbott  Pager# [phone]

## 2020-09-28 NOTE — Evaluation (Signed)
Physical Therapy Evaluation Patient Details Name: [name] MRN: [id number] DOB: November 27, 1961 Today's Date: 09/28/2020   History of Present Illness  59 yo female admitted s/p fall and acute kidney injury. pt's R leg has cellulitis and abscesses and chronic diabetic foot infection . PMH DM type 1 with insulin pump, diabetic foot ulcers CKDIII with hx of pancreatic and renal transplant on immunosuppression therapy and chronic prednisone  Clinical Impression  Pt admitted as above and presenting with functional mobility limitations 2* significant generalized weakness, balance deficits, very low endurance, ongoing back pain and bil diabetic foot ulcers. Pt currently requires total +2 max (A) for bed level mobility. Pt with early signs of skin break down on sacrum and recommend bed mattress overlay(air mattress). Pt with decreased (A) at home so SNF level rehab recommendation to maximize IND and safety.       Follow Up Recommendations SNF    Equipment Recommendations  None recommended by PT    Recommendations for Other Services       Precautions / Restrictions Precautions Precautions: Fall Precaution Comments: risk for skin break down Restrictions Weight Bearing Restrictions: No      Mobility  Bed Mobility Overal bed mobility: Needs Assistance Bed Mobility: Supine to Sit;Sit to Supine     Supine to sit: +2 for physical assistance;Max assist Sit to supine: +2 for physical assistance;Max assist   General bed mobility comments: pt with helicopter method used with pad to progress to EOB due to generalized weakness and back pain. pt with total (A) at EOB. pt initates trunk activation but unable to sustain. pt with dependence of bil LE on PT to position at EOB. pt eob dangle this session only  Transfers                 General transfer comment: Deferred - unsafe to attempt based on pt's current limitations  Ambulation/Gait                Stairs             Wheelchair Mobility    Modified Rankin (Stroke Patients Only)       Balance Overall balance assessment: Needs assistance Sitting-balance support: Bilateral upper extremity supported;Feet supported Sitting balance-Leahy Scale: Zero                                       Pertinent Vitals/Pain Pain Assessment: Faces Faces Pain Scale: Hurts even more Pain Location: back Pain Descriptors / Indicators: Grimacing;Guarding;Discomfort Pain Intervention(s): Limited activity within patient's tolerance;Monitored during session;Premedicated before session    Home Living Family/patient expects to be discharged to:: Skilled nursing facility Living Arrangements: Spouse/significant other               Additional Comments: pt reports multiple falls at home one being from commode    Prior Function Level of Independence: Needs assistance   Gait / Transfers Assistance Needed: Limited ambulation with rollator  ADL's / Homemaking Assistance Needed: pt states "its been difficult"        Hand Dominance   Dominant Hand: Right    Extremity/Trunk Assessment   Upper Extremity Assessment Upper Extremity Assessment: Generalized weakness RUE Deficits / Details: noted to have edema at elbow. Pt could benefit from elevate management with pillows as much as possible    Lower Extremity Assessment Lower Extremity Assessment: Generalized weakness;RLE deficits/detail;LLE deficits/detail RLE Deficits / Details: purple color with  ankle inverted positioning edema present not active movement this session LLE Deficits / Details: noted to have edema in calf and foot area, multiple ulcers noted on foot    Cervical / Trunk Assessment Cervical / Trunk Assessment: Other exceptions Cervical / Trunk Exceptions: R lateral curve to spine in static sitting eob reports pain in back   Communication   Communication: No difficulties  Cognition Arousal/Alertness: Awake/alert Behavior During  Therapy: Flat affect Overall Cognitive Status: No family/caregiver present to determine baseline cognitive functioning                                 General Comments: pt reports hearing her husband in room and only therapists present. Pt states "oh yeah" pt making comments that are not related to questioning or unprompted       General Comments General comments (skin integrity, edema, etc.): skin breakdown noted on sacrum - pt positoned with pillows rotated toward L to provide pressure relief    Exercises     Assessment/Plan    PT Assessment Patient needs continued PT services  PT Problem List Decreased strength;Decreased activity tolerance;Decreased balance;Decreased mobility;Decreased coordination;Decreased knowledge of use of DME;Pain       PT Treatment Interventions DME instruction;Gait training;Functional mobility training;Therapeutic activities;Therapeutic exercise;Patient/family education;Balance training    PT Goals (Current goals can be found in the Care Plan section)  Acute Rehab PT Goals Patient Stated Goal: to get breakfast PT Goal Formulation: With patient Time For Goal Achievement: 10/12/20 Potential to Achieve Goals: Fair    Frequency Min 3X/week   Barriers to discharge        Co-evaluation PT/OT/SLP Co-Evaluation/Treatment: Yes Reason for Co-Treatment: To address functional/ADL transfers;For patient/therapist safety PT goals addressed during session: Mobility/safety with mobility OT goals addressed during session: ADL's and self-care       AM-PAC PT "6 Clicks" Mobility  Outcome Measure Help needed turning from your back to your side while in a flat bed without using bedrails?: A Lot Help needed moving from lying on your back to sitting on the side of a flat bed without using bedrails?: A Lot Help needed moving to and from a bed to a chair (including a wheelchair)?: Total Help needed standing up from a chair using your arms (e.g.,  wheelchair or bedside chair)?: Total Help needed to walk in hospital room?: Total Help needed climbing 3-5 steps with a railing? : Total 6 Click Score: 8    End of Session   Activity Tolerance: Patient limited by fatigue;Patient limited by pain Patient left: in bed;with call bell/phone within reach;with bed alarm set Nurse Communication: Mobility status PT Visit Diagnosis: Muscle weakness (generalized) (M62.81);History of falling (Z91.81);Difficulty in walking, not elsewhere classified (R26.2);Pain    Time: 0925-0950 PT Time Calculation (min) (ACUTE ONLY): 25 min   Charges:   PT Evaluation $PT Eval Moderate Complexity: [place] Pager [phone] Office [phone]   Leontina Skidmore 09/28/2020, 11:56 AM

## 2020-09-29 DIAGNOSIS — I96 Gangrene, not elsewhere classified: Secondary | ICD-10-CM

## 2020-09-29 DIAGNOSIS — M86271 Subacute osteomyelitis, right ankle and foot: Secondary | ICD-10-CM

## 2020-09-29 LAB — GLUCOSE, CAPILLARY
Glucose-Capillary: 212 mg/dL — ABNORMAL HIGH (ref 70–99)
Glucose-Capillary: 184 mg/dL — ABNORMAL HIGH (ref 70–99)
Glucose-Capillary: 142 mg/dL — ABNORMAL HIGH (ref 70–99)
Glucose-Capillary: 118 mg/dL — ABNORMAL HIGH (ref 70–99)

## 2020-09-29 LAB — CBC WITH DIFFERENTIAL/PLATELET
Abs Immature Granulocytes: 0.06 10*3/uL (ref 0.00–0.07)
Basophils Absolute: 0 10*3/uL (ref 0.0–0.1)
Basophils Relative: 1 %
Eosinophils Absolute: 0.1 10*3/uL (ref 0.0–0.5)
Eosinophils Relative: 1 %
HCT: 23.7 % — ABNORMAL LOW (ref 36.0–46.0)
Hemoglobin: 7.4 g/dL — ABNORMAL LOW (ref 12.0–15.0)
Immature Granulocytes: 1 %
Lymphocytes Relative: 8 %
Lymphs Abs: 0.5 10*3/uL — ABNORMAL LOW (ref 0.7–4.0)
MCH: 34.9 pg — ABNORMAL HIGH (ref 26.0–34.0)
MCHC: 31.2 g/dL (ref 30.0–36.0)
MCV: 111.8 fL — ABNORMAL HIGH (ref 80.0–100.0)
Monocytes Absolute: 0.3 10*3/uL (ref 0.1–1.0)
Monocytes Relative: 5 %
Neutro Abs: 5.4 10*3/uL (ref 1.7–7.7)
Neutrophils Relative %: 84 %
Platelets: 306 10*3/uL (ref 150–400)
RBC: 2.12 MIL/uL — ABNORMAL LOW (ref 3.87–5.11)
RDW: 17.9 % — ABNORMAL HIGH (ref 11.5–15.5)
WBC: 6.4 10*3/uL (ref 4.0–10.5)
nRBC: 0 % (ref 0.0–0.2)

## 2020-09-29 LAB — HEMOGLOBIN A1C
Hgb A1c MFr Bld: 7.2 % — ABNORMAL HIGH (ref 4.8–5.6)
Mean Plasma Glucose: 160 mg/dL

## 2020-09-29 LAB — BASIC METABOLIC PANEL
Anion gap: 9 (ref 5–15)
BUN: 78 mg/dL — ABNORMAL HIGH (ref 6–20)
CO2: 20 mmol/L — ABNORMAL LOW (ref 22–32)
Calcium: 8.6 mg/dL — ABNORMAL LOW (ref 8.9–10.3)
Chloride: 104 mmol/L (ref 98–111)
Creatinine, Ser: 1.65 mg/dL — ABNORMAL HIGH (ref 0.44–1.00)
GFR calc Af Amer: 39 mL/min — ABNORMAL LOW (ref >60)
GFR calc non Af Amer: 33 mL/min — ABNORMAL LOW (ref >60)
Glucose, Bld: 197 mg/dL — ABNORMAL HIGH (ref 70–99)
Potassium: 5.2 mmol/L — ABNORMAL HIGH (ref 3.5–5.1)
Sodium: 133 mmol/L — ABNORMAL LOW (ref 135–145)

## 2020-09-29 MED ORDER — VANCOMYCIN HCL 500 MG/100ML IV SOLN
500.0000 mg | INTRAVENOUS | Status: DC
  Administered 2020-09-30 – 2020-10-01 (×3): 500 mg via INTRAVENOUS
  Filled 2020-09-29 (×4): qty 100

## 2020-09-29 MED ORDER — SODIUM CHLORIDE 0.9 % IV SOLN
2.0000 g | INTRAVENOUS | Status: DC
  Administered 2020-09-29 – 2020-09-30 (×2): 2 g via INTRAVENOUS
  Filled 2020-09-29 (×2): qty 2

## 2020-09-29 NOTE — Progress Notes (Signed)
Report  called to [facility] on [place].

## 2020-09-29 NOTE — Evaluation (Signed)
Clinical/Bedside Swallow Evaluation Patient Details  Name: [name] MRN: [id number] Date of Birth: March 14, 1961  Today's Date: 09/29/2020 Time: SLP Start Time (ACUTE ONLY): 1025 SLP Stop Time (ACUTE ONLY): 1032 SLP Time Calculation (min) (ACUTE ONLY): 7 min  Past Medical History:  Past Medical History:  Diagnosis Date  . Anemia of chronic renal failure    Past Surgical History:  Past Surgical History:  Procedure Laterality Date  . BLADDER SURGERY    . KIDNEY TRANSPLANT    . LEG SURGERY     HPI:  [name] is a 59 y.o. 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  She is with overall poor health, walks with a walker and lives with her husband. CXR 10/1 with no acute findings.   Assessment / Plan / Recommendation Clinical Impression  Pt prseents with functional swallowing as assessed clinically.  Pt tolerated all consistencies trialed with no clinical s/s of aspiraiton and exhibited good oral clearance of solids. Pt's most recent chest imaging was clear.  Pt reports occasional difficulty with rice when she eats too fast.  Pt reports recent episode where she felt she choked on bacon because it was crumbly.  Reviewed general aspiration precautions and pt may want to avoid foods that are especially difficult.    Recommend pt continue regualar texture diet with thin liquids.  Pt has no further ST needs.  SLP will sign off at this time.  SLP Visit Diagnosis: Dysphagia, unspecified (R13.10)    Aspiration Risk  No limitations    Diet Recommendation Regular;Thin liquid   Liquid Administration via: Cup;Straw Medication Administration: Whole meds with  liquid Supervision: Patient able to self feed;Staff to assist with self feeding (as needed) Compensations: Slow rate;Small sips/bites Postural Changes: Seated upright at 90 degrees    Other  Recommendations Oral Care Recommendations: Oral care BID   Follow up Recommendations None      Frequency and Duration  (N/A)          Prognosis Prognosis for Safe Diet Advancement:  (N/A)      Swallow Study   General HPI: [name] is a 59 y.o. 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  She is with overall poor health, walks with a walker and lives with her husband. CXR 10/1 with no acute findings. Type of Study: Bedside Swallow Evaluation Diet Prior to this Study: Regular;Thin liquids Temperature Spikes Noted: No History of Recent Intubation: No Behavior/Cognition: Alert;Cooperative;Pleasant mood Oral Cavity Assessment: Within Functional Limits Oral Care Completed by SLP: No Oral Cavity - Dentition: Adequate natural dentition Vision: Functional for self-feeding Patient Positioning: Upright in bed Baseline Vocal Quality: Normal Volitional Cough: Strong Volitional Swallow: Able to elicit    Oral/Motor/Sensory Function Overall Oral Motor/Sensory Function: Mild impairment Facial ROM: Within Functional Limits Facial Symmetry: Within Functional Limits Facial Strength: Within Functional Limits Lingual ROM: Within Functional Limits Lingual Symmetry: Within Functional Limits Lingual  Strength: Reduced Velum: Within Functional Limits Mandible: Within Functional Limits   Ice Chips Ice chips: Not tested   Thin Liquid Thin Liquid: Within functional  limits Presentation: Straw    Nectar Thick Nectar Thick Liquid: Not tested   Honey Thick Honey Thick Liquid: Not tested   Puree Puree: Within functional limits Presentation: Spoon   Solid     Solid: Within functional limits Presentation:  (SLP fed)      [name], [place], [place] Office: [phone]  09/29/2020,10:43 AM

## 2020-09-29 NOTE — H&P (View-Only) (Signed)
ORTHOPAEDIC CONSULTATION  REQUESTING PHYSICIAN: Barb Merino, MD  Chief Complaint: Painful gangrene and ulceration right foot.  HPI: [name] is a 59 y.o. female who presents with diabetic insensate neuropathy peripheral vascular disease status post renal transplant status post multiple surgical interventions to the right lower extremity who presents with black gangrenous changes with exposed necrotic bone and ischemic foot.  Past Medical History:  Diagnosis Date  . Anemia of chronic renal failure    Past Surgical History:  Procedure Laterality Date  . BLADDER SURGERY    . KIDNEY TRANSPLANT    . LEG SURGERY     Social History   Socioeconomic History  . Marital status: Married    Spouse name: Not on file  . Number of children: Not on file  . Years of education: Not on file  . Highest education level: Not on file  Occupational History  . Not on file  Tobacco Use  . Smoking status: Never Smoker  . Smokeless tobacco: Never Used  Substance and Sexual Activity  . Alcohol use: Never  . Drug use: Never  . Sexual activity: Not on file  Other Topics Concern  . Not on file  Social History Narrative  . Not on file   Social Determinants of Health   Financial Resource Strain:   . Difficulty of Paying Living Expenses: Not on file  Food Insecurity:   . Worried About Charity fundraiser in the Last Year: Not on file  . Ran Out of Food in the Last Year: Not on file  Transportation Needs:   . Lack of Transportation (Medical): Not on file  . Lack of Transportation (Non-Medical): Not on file  Physical Activity:   . Days of Exercise per Week: Not on file  . Minutes of Exercise per Session: Not on file  Stress:   . Feeling of Stress : Not on file  Social Connections:   . Frequency of Communication with Friends and Family: Not on file  . Frequency of Social Gatherings with Friends and Family: Not on file  . Attends Religious Services: Not on file  . Active Member of  Clubs or Organizations: Not on file  . Attends Archivist Meetings: Not on file  . Marital Status: Not on file   History reviewed. No pertinent family history. - negative except otherwise stated in the family history section No Known Allergies Prior to Admission medications   Medication Sig Start Date End Date Taking? Authorizing Provider  atorvastatin (LIPITOR) 20 MG tablet Take 10 mg by mouth daily.  08/28/20  Yes [provider]  azaTHIOprine (IMURAN) 50 MG tablet Take 50 mg by mouth daily. 08/23/20  Yes [provider]  cycloSPORINE (SANDIMMUNE) 25 MG capsule Take 75 mg by mouth 2 (two) times daily.   Yes [provider]  levothyroxine (SYNTHROID) 50 MCG tablet Take 50 mcg by mouth daily. 08/23/20  Yes [provider]  lisinopril (ZESTRIL) 5 MG tablet Take 5 mg by mouth every evening.  08/31/20  Yes [provider]  metoprolol tartrate (LOPRESSOR) 25 MG tablet Take 12.5 mg by mouth daily. 09/15/20  Yes [provider]  NOVOLOG 100 UNIT/ML injection Inject 0-75 Units into the skin. Insulin Pump. Sliding Scale 07/29/20  Yes [provider]  predniSONE (DELTASONE) 5 MG tablet Take 5 mg by mouth daily. 07/13/20  Yes [provider]  sertraline (ZOLOFT) 25 MG tablet Take 25 mg by mouth daily. 09/02/20  Yes [provider]  VAS [place] ABI WITH/WO TBI  Result Date: 09/28/2020 LOWER EXTREMITY DOPPLER STUDY Indications: Rest pain, and ulceration. High Risk Factors: Diabetes. Other Factors: History of pancreatic and renal transplant.  Limitations: Today's exam was limited due to patient positioning and bandages. Comparison Study: No prior study on file for comparison Performing Technologist: Sharion Dove RVS  Examination Guidelines: A complete evaluation includes at minimum, Doppler waveform signals and systolic blood pressure reading at the level of bilateral brachial, anterior tibial, and posterior tibial arteries, when  vessel segments are accessible. Bilateral testing is considered an integral part of a complete examination. Photoelectric Plethysmograph (PPG) waveforms and toe systolic pressure readings are included as required and additional duplex testing as needed. Limited examinations for reoccurring indications may be performed as noted.  ABI Findings: +---------+------------------+-----+-------------------+--------+ Right    Rt Pressure (mmHg)IndexWaveform           Comment  +---------+------------------+-----+-------------------+--------+ Brachial 127                    multiphasic                 +---------+------------------+-----+-------------------+--------+ PTA      244               1.78 dampened monophasic         +---------+------------------+-----+-------------------+--------+ DP       80                0.58 dampened monophasic         +---------+------------------+-----+-------------------+--------+ Great Toe0                 0.00                             +---------+------------------+-----+-------------------+--------+ +---------+------------------+-----+-----------+-------+ Left     Lt Pressure (mmHg)IndexWaveform   Comment +---------+------------------+-----+-----------+-------+ Brachial 137                    multiphasic        +---------+------------------+-----+-----------+-------+ PTA      77                0.56 monophasic         +---------+------------------+-----+-----------+-------+ DP       254               1.85 monophasic         +---------+------------------+-----+-----------+-------+ Great Toe29                0.21                    +---------+------------------+-----+-----------+-------+ +-------+-----------+-----------+------------+------------+ ABI/TBIToday's ABIToday's TBIPrevious ABIPrevious TBI +-------+-----------+-----------+------------+------------+ Right  1.78       0                                    +-------+-----------+-----------+------------+------------+ Left   1.85       0.21                                +-------+-----------+-----------+------------+------------+ Arterial wall calcification precludes accurate ankle pressures and ABIs.  Summary: Right: Resting right ankle-brachial index indicates noncompressible right lower extremity arteries. The right toe-brachial index is abnormal. Left: Resting left ankle-brachial index indicates noncompressible left lower extremity arteries. The left toe-brachial index is abnormal.  *See  table(s) above for measurements and observations.  Electronically signed by Harold Barban MD on 09/28/2020 at 7:43:38 PM.    Final    VAS [place] LOWER EXTREMITY ARTERIAL DUPLEX  Result Date: 09/28/2020 LOWER EXTREMITY ARTERIAL DUPLEX STUDY Indications: Rest pain, ulceration, and purple, cool foot. High Risk Factors: Diabetes. Other Factors: History of pancreatic and renal transplant.  Current ABI: B: non compressible with abnormal TBIs Limitations: Positioning, foot deformity, body habitus Comparison Study: No prior study on file for comparison Performing Technologist: Sharion Dove RVS  Examination Guidelines: A complete evaluation includes B-mode imaging, spectral Doppler, color Doppler, and power Doppler as needed of all accessible portions of each vessel. Bilateral testing is considered an integral part of a complete examination. Limited examinations for reoccurring indications may be performed as noted.  +----------+--------+-----+---------------+----------+--------+ RIGHT     PSV cm/sRatioStenosis       Waveform  Comments +----------+--------+-----+---------------+----------+--------+ CFA Prox  79                          biphasic           +----------+--------+-----+---------------+----------+--------+ DFA       127                         biphasic           +----------+--------+-----+---------------+----------+--------+ SFA Prox  33                           monophasic         +----------+--------+-----+---------------+----------+--------+ SFA Mid   232          50-74% stenosismonophasic         +----------+--------+-----+---------------+----------+--------+ SFA Distal33                          monophasic         +----------+--------+-----+---------------+----------+--------+ POP Prox  55                          monophasic         +----------+--------+-----+---------------+----------+--------+ POP Distal287          50-74% stenosismonophasic         +----------+--------+-----+---------------+----------+--------+ ATA Prox  33                          monophasic         +----------+--------+-----+---------------+----------+--------+ ATA Mid   38                          monophasic         +----------+--------+-----+---------------+----------+--------+ ATA Distal40                          monophasic         +----------+--------+-----+---------------+----------+--------+ PTA Mid   17                          monophasic         +----------+--------+-----+---------------+----------+--------+ PTA Distal10                          monophasic         +----------+--------+-----+---------------+----------+--------+ A focal velocity elevation of  was obtained at [facility] SFA. Findings are characteristic of 50-74% stenosis. A 2nd focal velocity elevation was visualized, measuring at Distal popliteal. Findings are characteristic of 50-74% stenosis.  Summary: Right: 50-74% stenosis noted in the superficial femoral artery. 50-74% stenosis noted in the popliteal artery. Diffuse calcific plaque noted throughout with abnormal waveforms.  See table(s) above for measurements and observations. Electronically signed by Harold Barban MD on 09/28/2020 at 7:43:49 PM.    Final    - pertinent xrays, CT, MRI studies were reviewed and independently interpreted  Positive ROS: All other systems have been reviewed and were  otherwise negative with the exception of those mentioned in the HPI and as above.  Physical Exam: General: Alert, no acute distress Psychiatric: Patient is competent for consent with normal mood and affect Lymphatic: No axillary or cervical lymphadenopathy Cardiovascular: No pedal edema Respiratory: No cyanosis, no use of accessory musculature GI: No organomegaly, abdomen is soft and non-tender    Images:  @ENCIMAGES @  Labs:  Lab Results  Component Value Date   HGBA1C 7.2 (H) 09/26/2020   ESRSEDRATE >140 (H) 09/26/2020   REPTSTATUS PENDING 09/26/2020   GRAMSTAIN NO WBC SEEN MODERATE GRAM NEGATIVE RODS  09/26/2020   CULT  09/26/2020    ABUNDANT SERRATIA MARCESCENS MODERATE PSEUDOMONAS AERUGINOSA    LABORGA SERRATIA MARCESCENS 09/26/2020    Lab Results  Component Value Date   ALBUMIN 2.0 (L) 09/26/2020   ALBUMIN 2.3 (L) 09/16/2020    Neurologic: Patient does not have protective sensation bilateral lower extremities.   MUSCULOSKELETAL:   Skin: Examination patient's right foot is cool to the touch the toes are purple dark and ischemic.  There is a large ischemic ulcer dorsally over the foot with exposed necrotic bone across the midfoot.  Patient has a strong femoral pulse but does not have palpable pulses at the ankle.  Review of the ankle-brachial indices shows significant calcification with ABIs greater than 1 monophasic flow.  Patient does not have active motor function the foot she has foot drop does not have active plantar flexion dorsiflexion inversion or eversion.  Patient states that she ambulates without a brace.  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  Plan: Plan: Discussed that her only option is to proceed with a transtibial amputation discussed that with her calcified vascular disease she is an increased risk of the wound not healing.  Patient  states she understands wished to proceed with surgery at this time patient is scheduled for transferred to [facility].  Will plan for surgery on Wednesday.  THank you for the consult and the opportunity to see Ms. Loni Beckwith, MD [facility] [phone] 5:28 PM

## 2020-09-29 NOTE — Progress Notes (Addendum)
Inpatient Diabetes Program Recommendations  AACE/ADA: New Consensus Statement on Inpatient Glycemic Control (2015)  Target Ranges:  Prepandial:   less than 140 mg/dL      Peak postprandial:   less than 180 mg/dL (1-2 hours)      Critically ill patients:  140 - 180 mg/dL   Lab Results  Component Value Date   GLUCAP 212 (H) 09/29/2020    Review of Glycemic Control Results for MABELLE, MUNGIN (MRN [id number]) as of 09/29/2020 09:35  Ref. Range 09/28/2020 07:37 09/28/2020 11:40 09/28/2020 17:34 09/28/2020 21:43 09/29/2020 08:08  Glucose-Capillary Latest Ref Range: 70 - 99 mg/dL 248 (H)  Novolog 2 units given  241 (H)  Novolog 2 units given 108 (H) 139 (H) 212 (H)   Diabetes history: DM type 1 (makes NO insulin; requires basal, correction, and carbohydrate coverage insulin) Outpatient Diabetes medications: Omnipod insulin pump with Novolog Basal insulin  Total daily basal insulin: 19.2 units/24 hours  Carb Coverage 1:20     1 unit for every 20 grams of carbohydrates  Insulin Sensitivity 1:88     1 unit drops blood glucose 88 mg/dl Current orders for Inpatient glycemic control:  Levemir 14 units Q24H, Novolog 4 units TID with meals, Novolog 0-9 units TID with meals, Novolog 0-5 units QHS; Prednisone 5 mg daily  Inpatient Diabetes Program Recommendations:    Please note Novolog meal coverage was not given yesterday. May need to d/c and follow trends with correction scale only for now.  -  Increase Levemir to 16 units.  Addendum 1116 am: Spoke with pt at bedside regarding insulin pump and possibly using SQ insulin when going home until follow up with her Endocrinologist. Pt reports having a follow up appt in November with Dr. Hartford Poli. Encouraged her or her husband to contact his office this week to inform them of DKA admission and pt herself reports needing adjustments because she has been high lately. PT reports being on insulin pump for 20 years, however when talking about SQ insulin and  explaining she would need basal ans bolus insulin pt did not realize she needed 2 different insulins in order to control her glucose levels as her pump functions. Pt has insulin pump and dexcom CGM at bedside in her bag. Pt reports not eating much just a little here and there and not eating full meals while here.  Pt reports having Toujeo and Humalog/Novolog at home.  Thanks,  Tama Headings RN, MSN, BC-ADM Inpatient Diabetes Coordinator Team Pager [phone] (8a-5p)

## 2020-09-29 NOTE — Care Management Important Message (Signed)
Important Message  Patient Details IM Letter given to the Patient Name: [name] MRN: [id number] Date of Birth: [birth year]   Medicare Important Message Given:  Yes     Kerin Salen 09/29/2020, 12:15 PM

## 2020-09-29 NOTE — Progress Notes (Signed)
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  She fell on the floor and could not get up by herself in the absence of her husband.  Her neighbor checked on her and called EMS. In the emergency room patient was hemodynamically stable.  Blood glucose 734 with anion gap and pH of 7.1.  Beta hydroxybutyrate more than 6.  Treated for DKA and admitted.  Patient also found with acute kidney injury on top of her chronic kidney disease. She has multiple diabetic ulcers, right foot ulcer was found to be gradually worsening with some ischemic changes on the tip of the toes. 10/3, discussed with vascular surgery and orthopedics.  She will need procedure on her right foot and probably need an amputation.  Orthopedic surgery recommended transfer to [facility] for Dr. Sharol Given to do procedure.  10/4, is still waiting for patient to have a bed at [facility].  Overnight is stable.  Assessment & Plan:   Principal Problem:   Diabetic ketoacidosis ([place]) Active Problems:   Anemia secondary to renal failure   History of simultaneous kidney and pancreas transplant ([place])   Chronic kidney disease (CKD) stage G3a/A1, moderately decreased glomerular filtration rate (GFR) between 45-59 mL/min/1.73 square meter and albuminuria creatinine ratio less than 30 mg/g (HCC)   Hypertension   Acute kidney injury superimposed on CKD (HCC)   Cellulitis and abscess of right leg   DKA, type 1 (HCC)   Pressure injury of skin  Diabetic ketoacidosis with history of type 1 diabetes: Anion gap closed.  Patient with normal  appetite.  Insulin pump disconnected on admission. Very sensitive blood sugars.   Continue Lantus 14 units at night, keep on low-dose sliding scale until patient has good appetite and may probably tolerate some prandial insulin.  Patient does have outpatient endocrinologist who manages her insulin pump, however I suspect insulin pump is malfunctioning.  We will discharge her on subcu insulin until she has a follow-up once patient medically ready.  Acute kidney injury with history of stage IIIa chronic kidney disease, kidney pancreas transplant on immunosuppression therapy, anemia of chronic disease: Acute urinary retention. Probably prerenal injury.  Patient was also retaining urine.  With IV fluids and Foley catheter placement, renal functions improving.  Renal ultrasound was essentially normal with debris on urinary bladder.  Improved with catheter. Urine cultures are pending.  Discussed with patient's nephrology, will continue current doses of prednisone, cyclosporine and azathioprine.  Right leg cellulitis and abscess, chronic diabetic foot infection, pressure ulcer left foot: Patient has significant swelling and cellulitis of the right foot.  Was started on Rocephin and Flagyl, patient continued to have worsening swelling.  Local cultures with Serratia and Pseudomonas.  Developing ischemic changes on and surrounding the wound.  Patient also has unstable foot with deformity and hardware present.  Patient with worsening swelling with fluctuation on dorsum of right foot.   Broadened antibiotic coverage to cover for MRSA and Pseudomonas.   Case discussed with orthopedic surgery, Dr. Alvan Dame who recommended patient to be transferred to [facility] to schedule for debridement or possible transtibial amputation with Dr. Sharol Given. Waiting for bed.  Hypertension:  Lisinopril discontinued.  Continue metoprolol.  Physical debility: Work with PT OT.    Subconjunctival swelling/ chemosis: Due to  hypoalbuminemia.  Improved.  Severe peripheral vascular disease, acute on chronic ischemic changes on right foot and toes: Lower extremity arterial studies with chronic arterial insufficiency. Ischemic changes of the right foot toes. Necrotizing infection of the right foot. Detailed discussion and examination at bedside along with vascular surgery.  Patient currently with acute kidney injury, transplanted kidney.  Right foot with chronic deformity, chronic nonhealing wound and instability.  Decided against contrasted studies.  She will probably best served with amputation to clean margin to help with mobility, prosthesis fitting.     DVT prophylaxis: heparin injection 5,000 Units Start: 09/28/20 2200   Code Status: Full code Family Communication: Husband on the phone called and updated. Disposition Plan: Status is: Inpatient, transferring to [facility].  Remains inpatient appropriate because:Inpatient level of care appropriate due to severity of illness   Dispo: The patient is from: Home              Anticipated d/c is to: Home with home health services.              Anticipated d/c date is: Unknown at this time.              Patient currently is not medically stable to d/c.         Consultants:   None.  Nephrology curbside  Vascular surgery  Orthopedic surgery  Procedures:   None  Antimicrobials:  Antibiotics Given (last 72 hours)    Date/Time Action Medication Dose Rate   09/27/20 1700 New Bag/Given   metroNIDAZOLE (FLAGYL) IVPB 500 mg 500 mg 100 mL/hr   09/27/20 2137 New Bag/Given   cefTRIAXone (ROCEPHIN) 1 g in sodium chloride 0.9 % 100 mL IVPB 1 g 200 mL/hr   09/28/20 0047 New Bag/Given   metroNIDAZOLE (FLAGYL) IVPB 500 mg 500 mg 100 mL/hr   09/28/20 1031 New Bag/Given   ceFEPIme (MAXIPIME) 2 g in sodium chloride 0.9 % 100 mL IVPB 2 g 200 mL/hr   09/28/20 1156 New Bag/Given   vancomycin (VANCOCIN) IVPB 1000 mg/200 mL premix 1,000 mg 200 mL/hr   09/28/20  1256 New Bag/Given   clindamycin (CLEOCIN) IVPB 600 mg 600 mg 100 mL/hr   09/28/20 2158 New Bag/Given   clindamycin (CLEOCIN) IVPB 600 mg 600 mg 100 mL/hr   09/29/20 0519 New Bag/Given   clindamycin (CLEOCIN) IVPB 600 mg 600 mg 100 mL/hr   09/29/20 1010 New Bag/Given   ceFEPIme (MAXIPIME) 2 g in sodium chloride 0.9 % 100 mL IVPB 2 g 200 mL/hr   09/29/20 1310 New Bag/Given   clindamycin (CLEOCIN) IVPB 600 mg 600 mg 100 mL/hr     Anti-infectives (From admission, onward)   Start     Dose/Rate Route Frequency Ordered Stop   09/30/20 0000  vancomycin (VANCOREADY) IVPB 500 mg/100 mL  Status:  Discontinued        500 mg 100 mL/hr over 60 Minutes Intravenous Every 36 hours 09/28/20 1215 09/29/20 0825   09/29/20 2300  vancomycin (VANCOREADY) IVPB 500 mg/100 mL  Status:  Discontinued        500 mg 100 mL/hr over 60 Minutes Intravenous Every 36 hours 09/28/20 1025 09/28/20 1244   09/29/20 2200  vancomycin (VANCOREADY) IVPB 500 mg/100 mL        500 mg 100 mL/hr over 60 Minutes Intravenous Every 24 hours 09/29/20 0825  09/29/20 1000  ceFEPIme (MAXIPIME) 1 g in sodium chloride 0.9 % 100 mL IVPB  Status:  Discontinued        1 g 200 mL/hr over 30 Minutes Intravenous Every 24 hours 09/28/20 1048 09/29/20 0824   09/29/20 1000  ceFEPIme (MAXIPIME) 2 g in sodium chloride 0.9 % 100 mL IVPB        2 g 200 mL/hr over 30 Minutes Intravenous Every 24 hours 09/29/20 0824     09/28/20 1200  clindamycin (CLEOCIN) IVPB 600 mg        600 mg 100 mL/hr over 30 Minutes Intravenous Every 8 hours 09/28/20 0919 10/01/20 1359   09/28/20 1100  vancomycin (VANCOCIN) IVPB 1000 mg/200 mL premix        1,000 mg 200 mL/hr over 60 Minutes Intravenous  Once 09/28/20 1023 09/28/20 1256   09/28/20 0945  ceFEPIme (MAXIPIME) 2 g in sodium chloride 0.9 % 100 mL IVPB        2 g 200 mL/hr over 30 Minutes Intravenous  Once 09/28/20 0937 09/28/20 1101   09/28/20 0930  vancomycin (VANCOCIN) IVPB 1000 mg/200 mL premix  Status:   Discontinued        1,000 mg 200 mL/hr over 60 Minutes Intravenous  Once 09/28/20 0918 09/28/20 0919   09/28/20 0930  vancomycin (VANCOCIN) IVPB 1000 mg/200 mL premix  Status:  Discontinued        1,000 mg 200 mL/hr over 60 Minutes Intravenous  Once 09/28/20 0919 09/28/20 1021   09/28/20 0930  piperacillin-tazobactam (ZOSYN) IVPB 3.375 g  Status:  Discontinued        3.375 g 100 mL/hr over 30 Minutes Intravenous  Once 09/28/20 0919 09/28/20 0937   09/27/20 1700  metroNIDAZOLE (FLAGYL) IVPB 500 mg  Status:  Discontinued        500 mg 100 mL/hr over 60 Minutes Intravenous Every 8 hours 09/27/20 1616 09/28/20 0917   09/26/20 2245  cefTRIAXone (ROCEPHIN) 1 g in sodium chloride 0.9 % 100 mL IVPB  Status:  Discontinued        1 g 200 mL/hr over 30 Minutes Intravenous Every 24 hours 09/26/20 2244 09/28/20 0917         Subjective: Seen and examined.  No overnight events.  Today she was more comfortable.  Denies any nausea vomiting.  Denies any abdominal pain.  Foley catheter was placed for more than a liter retention and she felt better after that.  Complained of pain on the right foot and was very happy to have some relief of pain with 2 mg of morphine.  Objective: Vitals:   09/28/20 2159 09/29/20 0500 09/29/20 0636 09/29/20 1419  BP: 123/72  (!) 145/74 124/68  Pulse: 75  85 85  Resp: 18  15 18   Temp: 98.4 F (36.9 C)  98.4 F (36.9 C) 99.6 F (37.6 C)  TempSrc: Oral  Oral Oral  SpO2: 95%  93% 93%  Weight:  49.1 kg    Height:        Intake/Output Summary (Last 24 hours) at 09/29/2020 1502 Last data filed at 09/29/2020 1420 Gross per 24 hour  Intake 2245.23 ml  Output 2075 ml  Net 170.23 ml   Filed Weights   09/26/20 1452 09/28/20 0500 09/29/20 0500  Weight: 45.4 kg 47.4 kg 49.1 kg    Examination:  General exam: Chronically sick looking, not in any distress,  On room air. Looks fairly comfortable today. Respiratory system: Clear to auscultation. Respiratory effort normal.  No added sounds. Cardiovascular system: S1 & S2 heard, RRR. No JVD, murmurs. Gastrointestinal system: Abdomen is nondistended, soft and nontender. No organomegaly or masses felt. Normal bowel sounds heard. Central nervous system: Alert and oriented. No focal neurological deficits.  Generalized weakness. Extremities: Symmetric 5 x 5 power. Skin: Multiple ecchymosis. Both eyes with chemosis, less pronounced today. Unstageable pressure ulcer present on admission on the left foot. Right foot with boggy swelling, necrotic margins and purple patches along with purple discoloration of the toes.  Pictures taken with patient permission.         Data Reviewed: I have personally reviewed following labs and imaging studies  CBC: Recent Labs  Lab 09/26/20 1534 09/26/20 1656 09/28/20 0215 09/29/20 0639  WBC 15.5*  --  7.3 6.4  NEUTROABS 14.0*  --  6.0 5.4  HGB 7.7* 7.8* 7.7* 7.4*  HCT 25.0* 23.0* 24.3* 23.7*  MCV 114.7*  --  112.5* 111.8*  PLT 408*  --  331 735   Basic Metabolic Panel: Recent Labs  Lab 09/26/20 1534 09/26/20 1534 09/26/20 1656 09/26/20 2245 09/27/20 0245 09/27/20 0432 09/28/20 0215 09/29/20 0639  NA 124*   < > 124* 131* 133*  --  133* 133*  K 5.7*   < > 5.7* 4.8 4.8  --  5.0 5.2*  CL 91*   < > 94* 97* 99  --  104 104  CO2 9*  --   --  18* 20*  --  19* 20*  GLUCOSE 734*   < > 679* 395* 211*  --  124* 197*  BUN 106*   < > 115* 99* 91*  --  88* 78*  CREATININE 3.63*   < > 3.50* 3.14* 3.10*  --  2.30* 1.65*  CALCIUM 8.2*  --   --  8.3* 8.1*  --  8.0* 8.6*  MG  --   --   --   --   --  1.8 1.9  --   PHOS  --   --   --   --   --  4.4 3.2  --    < > = values in this interval not displayed.   GFR: Estimated Creatinine Clearance: 28.1 mL/min (A) (by C-G formula based on SCr of 1.65 mg/dL (H)). Liver Function Tests: Recent Labs  Lab 09/26/20 1534  AST 22  ALT 23  ALKPHOS 96  BILITOT 2.5*  PROT 5.4*  ALBUMIN 2.0*   No results for input(s): LIPASE, AMYLASE  in the last 168 hours. No results for input(s): AMMONIA in the last 168 hours. Coagulation Profile: No results for input(s): INR, PROTIME in the last 168 hours. Cardiac Enzymes: Recent Labs  Lab 09/26/20 1634  CKTOTAL 94   BNP (last 3 results) No results for input(s): PROBNP in the last 8760 hours. HbA1C: Recent Labs    09/26/20 1534  HGBA1C 7.2*   CBG: Recent Labs  Lab 09/28/20 1140 09/28/20 1734 09/28/20 2143 09/29/20 0808 09/29/20 1149  GLUCAP 241* 108* 139* 212* 184*   Lipid Profile: No results for input(s): CHOL, HDL, LDLCALC, TRIG, CHOLHDL, LDLDIRECT in the last 72 hours. Thyroid Function Tests: No results for input(s): TSH, T4TOTAL, FREET4, T3FREE, THYROIDAB in the last 72 hours. Anemia Panel: No results for input(s): VITAMINB12, FOLATE, FERRITIN, TIBC, IRON, RETICCTPCT in the last 72 hours. Sepsis Labs: Recent Labs  Lab 09/26/20 1535  LATICACIDVEN 1.3    Recent Results (from the past 240 hour(s))  Culture, blood (routine x 2)  Status: None (Preliminary result)   Collection Time: 09/26/20  4:45 PM   Specimen: BLOOD  Result Value Ref Range Status   Specimen Description   Final    BLOOD LEFT ANTECUBITAL Performed at [facility] [address], [place], [place] [address]    Special Requests   Final    BOTTLES DRAWN AEROBIC AND ANAEROBIC Blood Culture adequate volume Performed at [facility] [address] [place], [place] [address]    Culture   Final    NO GROWTH 3 DAYS Performed at [facility], [place] [address], [place], [place] [address]    Report Status PENDING  Incomplete  Culture, blood (routine x 2)     Status: None (Preliminary result)   Collection Time: 09/26/20  4:55 PM   Specimen: BLOOD RIGHT FOREARM  Result Value Ref Range Status   Specimen Description   Final    BLOOD RIGHT FOREARM Performed at [facility] [address], [place], [place] [address]    Special  Requests   Final    BOTTLES DRAWN AEROBIC AND ANAEROBIC Blood Culture results may not be optimal due to an inadequate volume of blood received in culture bottles Performed at [facility] [address], [place], [place] [address]    Culture   Final    NO GROWTH 3 DAYS Performed at [facility], [place] [address] [place], [place] [address]    Report Status PENDING  Incomplete  Respiratory Panel by RT PCR (Flu A&B, Covid) - Nasopharyngeal Swab     Status: None   Collection Time: 09/26/20  5:06 PM   Specimen: Nasopharyngeal Swab  Result Value Ref Range Status   SARS Coronavirus 2 by RT PCR NEGATIVE NEGATIVE Final    Comment: (NOTE) SARS-CoV-2 target nucleic acids are NOT DETECTED.  The SARS-CoV-2 RNA is generally detectable in upper respiratoy specimens during the acute phase of infection. The lowest concentration of SARS-CoV-2 viral copies this assay can detect is 131 copies/mL. A negative result does not preclude SARS-Cov-2 infection and should not be used as the sole basis for treatment or other patient management decisions. A negative result may occur with  improper specimen collection/handling, submission of specimen other than nasopharyngeal swab, presence of viral mutation(s) within the areas targeted by this assay, and inadequate number of viral copies (<131 copies/mL). A negative result must be combined with clinical observations, patient history, and epidemiological information. The expected result is Negative.  Fact Sheet for Patients:  PinkCheek.be  Fact Sheet for Healthcare Providers:  GravelBags.it  This test is no t yet approved or cleared by the [place] FDA and  has been authorized for detection and/or diagnosis of SARS-CoV-2 by FDA under an Emergency Use Authorization (EUA). This EUA will remain  in effect (meaning this test can be used) for the duration of the COVID-19  declaration under Section 564(b)(1) of the Act, 21 U.S.C. section 360bbb-3(b)(1), unless the authorization is terminated or revoked sooner.     Influenza A by PCR NEGATIVE NEGATIVE Final   Influenza B by PCR NEGATIVE NEGATIVE Final    Comment: (NOTE) The Xpert Xpress SARS-CoV-2/FLU/RSV assay is intended as an aid in  the diagnosis of influenza from Nasopharyngeal swab specimens and  should not be used as a sole basis for treatment. Nasal washings and  aspirates are unacceptable for Xpert Xpress SARS-CoV-2/FLU/RSV  testing.  Fact Sheet for Patients: PinkCheek.be  Fact Sheet for Healthcare Providers: GravelBags.it  This test is not yet approved or cleared by the [place] and  has been authorized for detection and/or diagnosis of SARS-CoV-2 by  FDA under an Emergency Use Authorization (EUA). This EUA will remain  in effect (meaning this test can be used) for the duration of the  Covid-19 declaration under Section 564(b)(1) of the Act, 21  U.S.C. section 360bbb-3(b)(1), unless the authorization is  terminated or revoked. Performed at [facility], [place] [address], [place], [place] [address]   Aerobic Culture (superficial specimen)     Status: None (Preliminary result)   Collection Time: 09/26/20  5:34 PM   Specimen: Foot; Wound  Result Value Ref Range Status   Specimen Description FOOT RIGHT  Final   Special Requests NONE  Final   Gram Stain NO WBC SEEN MODERATE GRAM NEGATIVE RODS   Final   Culture   Final    ABUNDANT SERRATIA MARCESCENS MODERATE PSEUDOMONAS AERUGINOSA    Report Status PENDING  Incomplete   Organism ID, Bacteria SERRATIA MARCESCENS  Final      Susceptibility   Serratia marcescens - MIC*    CEFAZOLIN >=64 RESISTANT Resistant     CEFEPIME <=0.12 SENSITIVE Sensitive     CEFTAZIDIME <=1 SENSITIVE Sensitive     CEFTRIAXONE <=0.25 SENSITIVE Sensitive     CIPROFLOXACIN <=0.25  SENSITIVE Sensitive     GENTAMICIN <=1 SENSITIVE Sensitive     TRIMETH/SULFA Value in next row Sensitive      <=20 SENSITIVEPerformed at [facility], [address], [place], [place] [address]    * ABUNDANT SERRATIA MARCESCENS  MRSA PCR Screening     Status: None   Collection Time: 09/27/20  4:32 AM   Specimen: Nasal Mucosa; Nasopharyngeal  Result Value Ref Range Status   MRSA by PCR NEGATIVE NEGATIVE Final    Comment:        The GeneXpert MRSA Assay (FDA approved for NASAL specimens only), is one component of a comprehensive MRSA colonization surveillance program. It is not intended to diagnose MRSA infection nor to guide or monitor treatment for MRSA infections. Performed at [facility], [place] [address], [place], [place] [address]          Radiology Studies: US RENAL  Result Date: 09/27/2020 CLINICAL DATA:  Acute kidney injury. EXAM: RENAL / URINARY TRACT ULTRASOUND COMPLETE COMPARISON:  None. FINDINGS: Right Kidney: Not visualized. Left Kidney: Not visualized. Bladder: Hypoechoic area measuring 3.5 x 2.7 x 0.9 cm is noted posteriorly in the urinary bladder which may represent debris, but possible mass cannot be excluded. Cystoscopy is recommended for further evaluation. Other: Small bilateral pleural effusions are noted incidentally. IMPRESSION: 1. Kidneys are not visualized. 2. 3.5 x 2.7 x 0.9 cm hypoechoic area is noted posteriorly in the urinary bladder which may represent debris, but possible mass cannot be excluded. Cystoscopy is recommended for further evaluation. Electronically Signed   By: Marijo Conception M.D.   On: 09/27/2020 16:47   VAS [place] ABI WITH/WO TBI  Result Date: 09/28/2020 LOWER EXTREMITY DOPPLER STUDY Indications: Rest pain, and ulceration. High Risk Factors: Diabetes. Other Factors: History of pancreatic and renal transplant.  Limitations: Today's exam was limited due to patient positioning and bandages. Comparison Study: No prior  study on file for comparison Performing Technologist: Sharion Dove RVS  Examination Guidelines: A complete evaluation includes at minimum, Doppler waveform signals and systolic blood pressure reading at the level of bilateral brachial, anterior tibial, and posterior tibial arteries, when  vessel segments are accessible. Bilateral testing is considered an integral part of a complete examination. Photoelectric Plethysmograph (PPG) waveforms and toe systolic pressure readings are included as required and additional duplex testing as needed. Limited examinations for reoccurring indications may be performed as noted.  ABI Findings: +---------+------------------+-----+-------------------+--------+ Right    Rt Pressure (mmHg)IndexWaveform           Comment  +---------+------------------+-----+-------------------+--------+ Brachial 127                    multiphasic                 +---------+------------------+-----+-------------------+--------+ PTA      244               1.78 dampened monophasic         +---------+------------------+-----+-------------------+--------+ DP       80                0.58 dampened monophasic         +---------+------------------+-----+-------------------+--------+ Great Toe0                 0.00                             +---------+------------------+-----+-------------------+--------+ +---------+------------------+-----+-----------+-------+ Left     Lt Pressure (mmHg)IndexWaveform   Comment +---------+------------------+-----+-----------+-------+ Brachial 137                    multiphasic        +---------+------------------+-----+-----------+-------+ PTA      77                0.56 monophasic         +---------+------------------+-----+-----------+-------+ DP       254               1.85 monophasic         +---------+------------------+-----+-----------+-------+ Great Toe29                0.21                     +---------+------------------+-----+-----------+-------+ +-------+-----------+-----------+------------+------------+ ABI/TBIToday's ABIToday's TBIPrevious ABIPrevious TBI +-------+-----------+-----------+------------+------------+ Right  1.78       0                                   +-------+-----------+-----------+------------+------------+ Left   1.85       0.21                                +-------+-----------+-----------+------------+------------+ Arterial wall calcification precludes accurate ankle pressures and ABIs.  Summary: Right: Resting right ankle-brachial index indicates noncompressible right lower extremity arteries. The right toe-brachial index is abnormal. Left: Resting left ankle-brachial index indicates noncompressible left lower extremity arteries. The left toe-brachial index is abnormal.  *See table(s) above for measurements and observations.  Electronically signed by Harold Barban MD on 09/28/2020 at 7:43:38 PM.    Final    VAS [place] LOWER EXTREMITY ARTERIAL DUPLEX  Result Date: 09/28/2020 LOWER EXTREMITY ARTERIAL DUPLEX STUDY Indications: Rest pain, ulceration, and purple, cool foot. High Risk Factors: Diabetes. Other Factors: History of pancreatic and renal transplant.  Current ABI: B: non compressible with abnormal TBIs Limitations: Positioning, foot deformity, body habitus Comparison Study: No prior study on file for comparison Performing Technologist: Candace  Mauro Kaufmann RVS  Examination Guidelines: A complete evaluation includes B-mode imaging, spectral Doppler, color Doppler, and power Doppler as needed of all accessible portions of each vessel. Bilateral testing is considered an integral part of a complete examination. Limited examinations for reoccurring indications may be performed as noted.  +----------+--------+-----+---------------+----------+--------+ RIGHT     PSV cm/sRatioStenosis       Waveform  Comments  +----------+--------+-----+---------------+----------+--------+ CFA Prox  79                          biphasic           +----------+--------+-----+---------------+----------+--------+ DFA       127                         biphasic           +----------+--------+-----+---------------+----------+--------+ SFA Prox  33                          monophasic         +----------+--------+-----+---------------+----------+--------+ SFA Mid   232          50-74% stenosismonophasic         +----------+--------+-----+---------------+----------+--------+ SFA Distal33                          monophasic         +----------+--------+-----+---------------+----------+--------+ POP Prox  55                          monophasic         +----------+--------+-----+---------------+----------+--------+ POP Distal287          50-74% stenosismonophasic         +----------+--------+-----+---------------+----------+--------+ ATA Prox  33                          monophasic         +----------+--------+-----+---------------+----------+--------+ ATA Mid   38                          monophasic         +----------+--------+-----+---------------+----------+--------+ ATA Distal40                          monophasic         +----------+--------+-----+---------------+----------+--------+ PTA Mid   17                          monophasic         +----------+--------+-----+---------------+----------+--------+ PTA Distal10                          monophasic         +----------+--------+-----+---------------+----------+--------+ A focal velocity elevation of was obtained at Mid SFA. Findings are characteristic of 50-74% stenosis. A 2nd focal velocity elevation was visualized, measuring at Distal popliteal. Findings are characteristic of 50-74% stenosis.  Summary: Right: 50-74% stenosis noted in the superficial femoral artery. 50-74% stenosis noted in the popliteal artery.  Diffuse calcific plaque noted throughout with abnormal waveforms.  See table(s) above for measurements and observations. Electronically signed by Harold Barban MD on 09/28/2020 at 7:43:49 PM.    Final         Scheduled  Meds: . atorvastatin  10 mg Oral Daily  . azaTHIOprine  50 mg Oral Daily  . Chlorhexidine Gluconate Cloth  6 each Topical Daily  . cycloSPORINE  75 mg Oral BID  . heparin injection (subcutaneous)  5,000 Units Subcutaneous Q8H  . insulin aspart  0-5 Units Subcutaneous QHS  . insulin aspart  0-6 Units Subcutaneous TID WC  . insulin detemir  0.3 Units/kg Subcutaneous Q24H  . levothyroxine  50 mcg Oral QAC breakfast  . metoprolol tartrate  12.5 mg Oral Daily  . predniSONE  5 mg Oral Q breakfast  . sertraline  25 mg Oral Daily   Continuous Infusions: . ceFEPime (MAXIPIME) IV 2 g (09/29/20 1010)  . clindamycin (CLEOCIN) IV 600 mg (09/29/20 1310)  . lactated ringers 100 mL/hr at 09/29/20 1005  . vancomycin       LOS: 3 days    Time spent: 35 minutes    Barb Merino, MD [facility] Pager [phone]

## 2020-09-29 NOTE — Progress Notes (Signed)
Pharmacy Antibiotic Note  Doris Eldred is a 59 y.o. female admitted on 09/26/2020 with cellulitis.  Pharmacy has been consulted for vancomycin & cefepime dosing. Pt on Imuran/cyclosprorine/prednisone for pancreatic & renal transplant  Today, day #3 abx - Afebrile - WBC wnl - AKI on CKD SCr 3.63 on admit, down to 1.65 today  Plan: Increase Cefepime 2g IV q24   Clindamycin 600 mg IV q8h x 3 days per MD Adjust Vancomycin to 500mg  IV q24h, goal trought 15-20 mcg/ml Vancomycin levels as needed  Height: 5\' 3"  (160 cm) Weight: 49.1 kg (108 lb 3.9 oz) IBW/kg (Calculated) : 52.4  Temp (24hrs), Avg:98.3 F (36.8 C), Min:98 F (36.7 C), Max:98.4 F (36.9 C)  Recent Labs  Lab 09/26/20 1534 09/26/20 1534 09/26/20 1535 09/26/20 1656 09/26/20 2245 09/27/20 0245 09/28/20 0215 09/29/20 0639  WBC 15.5*  --   --   --   --   --  7.3 6.4  CREATININE 3.63*   < >  --  3.50* 3.14* 3.10* 2.30* 1.65*  LATICACIDVEN  --   --  1.3  --   --   --   --   --    < > = values in this interval not displayed.    Estimated Creatinine Clearance: 28.1 mL/min (A) (by C-G formula based on SCr of 1.65 mg/dL (H)).    No Known Allergies  Antimicrobials this admission:  10/2 CTX> 10/3 10/2 flagyl >> 10/3 10/3 vanc>> 10/3 cefepime>> 10/3 clinda x 3 days>>10/6  Dose adjustments this admission:   Microbiology results:  10/2 MRSA neg 10/1 R foot superficial Cx: abundant Serratia marcescens, mod PsA aeruginosa 10/1 covid/flu A/B neg 10/1 BCx2: ngtd 10/1 HIV NR  Thank you for allowing pharmacy to be a part of this patient's care.  Peggyann Juba, PharmD, BCPS Pharmacy: [phone] 09/29/2020 8:26 AM

## 2020-09-29 NOTE — Progress Notes (Signed)
Patient called husband to let him know about transfer to [facility] Clydene Laming RN

## 2020-09-29 NOTE — Progress Notes (Signed)
Patient ID: [name], female   DOB: 02/15/1961, 59 y.o.   MRN: [id number]  Consult received Plan is to transfer to [facility] for definitive treatment through Dr Sharol Given Continue medical management  Will follow status while at [facility]

## 2020-09-29 NOTE — Consult Note (Signed)
ORTHOPAEDIC CONSULTATION  REQUESTING PHYSICIAN: Barb Merino, MD  Chief Complaint: Painful gangrene and ulceration right foot.  HPI: [name] is a 59 y.o. female who presents with diabetic insensate neuropathy peripheral vascular disease status post renal transplant status post multiple surgical interventions to the right lower extremity who presents with black gangrenous changes with exposed necrotic bone and ischemic foot.  Past Medical History:  Diagnosis Date  . Anemia of chronic renal failure    Past Surgical History:  Procedure Laterality Date  . BLADDER SURGERY    . KIDNEY TRANSPLANT    . LEG SURGERY     Social History   Socioeconomic History  . Marital status: Married    Spouse name: Not on file  . Number of children: Not on file  . Years of education: Not on file  . Highest education level: Not on file  Occupational History  . Not on file  Tobacco Use  . Smoking status: Never Smoker  . Smokeless tobacco: Never Used  Substance and Sexual Activity  . Alcohol use: Never  . Drug use: Never  . Sexual activity: Not on file  Other Topics Concern  . Not on file  Social History Narrative  . Not on file   Social Determinants of Health   Financial Resource Strain:   . Difficulty of Paying Living Expenses: Not on file  Food Insecurity:   . Worried About Charity fundraiser in the Last Year: Not on file  . Ran Out of Food in the Last Year: Not on file  Transportation Needs:   . Lack of Transportation (Medical): Not on file  . Lack of Transportation (Non-Medical): Not on file  Physical Activity:   . Days of Exercise per Week: Not on file  . Minutes of Exercise per Session: Not on file  Stress:   . Feeling of Stress : Not on file  Social Connections:   . Frequency of Communication with Friends and Family: Not on file  . Frequency of Social Gatherings with Friends and Family: Not on file  . Attends Religious Services: Not on file  . Active Member of  Clubs or Organizations: Not on file  . Attends Archivist Meetings: Not on file  . Marital Status: Not on file   History reviewed. No pertinent family history. - negative except otherwise stated in the family history section No Known Allergies Prior to Admission medications   Medication Sig Start Date End Date Taking? Authorizing Provider  atorvastatin (LIPITOR) 20 MG tablet Take 10 mg by mouth daily.  08/28/20  Yes [provider]  azaTHIOprine (IMURAN) 50 MG tablet Take 50 mg by mouth daily. 08/23/20  Yes [provider]  cycloSPORINE (SANDIMMUNE) 25 MG capsule Take 75 mg by mouth 2 (two) times daily.   Yes [provider]  levothyroxine (SYNTHROID) 50 MCG tablet Take 50 mcg by mouth daily. 08/23/20  Yes [provider]  lisinopril (ZESTRIL) 5 MG tablet Take 5 mg by mouth every evening.  08/31/20  Yes [provider]  metoprolol tartrate (LOPRESSOR) 25 MG tablet Take 12.5 mg by mouth daily. 09/15/20  Yes [provider]  NOVOLOG 100 UNIT/ML injection Inject 0-75 Units into the skin. Insulin Pump. Sliding Scale 07/29/20  Yes [provider]  predniSONE (DELTASONE) 5 MG tablet Take 5 mg by mouth daily. 07/13/20  Yes [provider]  sertraline (ZOLOFT) 25 MG tablet Take 25 mg by mouth daily. 09/02/20  Yes [provider]  VAS [place] ABI WITH/WO TBI  Result Date: 09/28/2020 LOWER EXTREMITY DOPPLER STUDY Indications: Rest pain, and ulceration. High Risk Factors: Diabetes. Other Factors: History of pancreatic and renal transplant.  Limitations: Today's exam was limited due to patient positioning and bandages. Comparison Study: No prior study on file for comparison Performing Technologist: Sharion Dove RVS  Examination Guidelines: A complete evaluation includes at minimum, Doppler waveform signals and systolic blood pressure reading at the level of bilateral brachial, anterior tibial, and posterior tibial arteries, when  vessel segments are accessible. Bilateral testing is considered an integral part of a complete examination. Photoelectric Plethysmograph (PPG) waveforms and toe systolic pressure readings are included as required and additional duplex testing as needed. Limited examinations for reoccurring indications may be performed as noted.  ABI Findings: +---------+------------------+-----+-------------------+--------+ Right    Rt Pressure (mmHg)IndexWaveform           Comment  +---------+------------------+-----+-------------------+--------+ Brachial 127                    multiphasic                 +---------+------------------+-----+-------------------+--------+ PTA      244               1.78 dampened monophasic         +---------+------------------+-----+-------------------+--------+ DP       80                0.58 dampened monophasic         +---------+------------------+-----+-------------------+--------+ Great Toe0                 0.00                             +---------+------------------+-----+-------------------+--------+ +---------+------------------+-----+-----------+-------+ Left     Lt Pressure (mmHg)IndexWaveform   Comment +---------+------------------+-----+-----------+-------+ Brachial 137                    multiphasic        +---------+------------------+-----+-----------+-------+ PTA      77                0.56 monophasic         +---------+------------------+-----+-----------+-------+ DP       254               1.85 monophasic         +---------+------------------+-----+-----------+-------+ Great Toe29                0.21                    +---------+------------------+-----+-----------+-------+ +-------+-----------+-----------+------------+------------+ ABI/TBIToday's ABIToday's TBIPrevious ABIPrevious TBI +-------+-----------+-----------+------------+------------+ Right  1.78       0                                    +-------+-----------+-----------+------------+------------+ Left   1.85       0.21                                +-------+-----------+-----------+------------+------------+ Arterial wall calcification precludes accurate ankle pressures and ABIs.  Summary: Right: Resting right ankle-brachial index indicates noncompressible right lower extremity arteries. The right toe-brachial index is abnormal. Left: Resting left ankle-brachial index indicates noncompressible left lower extremity arteries. The left toe-brachial index is abnormal.  *See  table(s) above for measurements and observations.  Electronically signed by Harold Barban MD on 09/28/2020 at 7:43:38 PM.    Final    VAS [place] LOWER EXTREMITY ARTERIAL DUPLEX  Result Date: 09/28/2020 LOWER EXTREMITY ARTERIAL DUPLEX STUDY Indications: Rest pain, ulceration, and purple, cool foot. High Risk Factors: Diabetes. Other Factors: History of pancreatic and renal transplant.  Current ABI: B: non compressible with abnormal TBIs Limitations: Positioning, foot deformity, body habitus Comparison Study: No prior study on file for comparison Performing Technologist: Sharion Dove RVS  Examination Guidelines: A complete evaluation includes B-mode imaging, spectral Doppler, color Doppler, and power Doppler as needed of all accessible portions of each vessel. Bilateral testing is considered an integral part of a complete examination. Limited examinations for reoccurring indications may be performed as noted.  +----------+--------+-----+---------------+----------+--------+ RIGHT     PSV cm/sRatioStenosis       Waveform  Comments +----------+--------+-----+---------------+----------+--------+ CFA Prox  79                          biphasic           +----------+--------+-----+---------------+----------+--------+ DFA       127                         biphasic           +----------+--------+-----+---------------+----------+--------+ SFA Prox  33                           monophasic         +----------+--------+-----+---------------+----------+--------+ SFA Mid   232          50-74% stenosismonophasic         +----------+--------+-----+---------------+----------+--------+ SFA Distal33                          monophasic         +----------+--------+-----+---------------+----------+--------+ POP Prox  55                          monophasic         +----------+--------+-----+---------------+----------+--------+ POP Distal287          50-74% stenosismonophasic         +----------+--------+-----+---------------+----------+--------+ ATA Prox  33                          monophasic         +----------+--------+-----+---------------+----------+--------+ ATA Mid   38                          monophasic         +----------+--------+-----+---------------+----------+--------+ ATA Distal40                          monophasic         +----------+--------+-----+---------------+----------+--------+ PTA Mid   17                          monophasic         +----------+--------+-----+---------------+----------+--------+ PTA Distal10                          monophasic         +----------+--------+-----+---------------+----------+--------+ A focal velocity elevation of  was obtained at [facility] SFA. Findings are characteristic of 50-74% stenosis. A 2nd focal velocity elevation was visualized, measuring at Distal popliteal. Findings are characteristic of 50-74% stenosis.  Summary: Right: 50-74% stenosis noted in the superficial femoral artery. 50-74% stenosis noted in the popliteal artery. Diffuse calcific plaque noted throughout with abnormal waveforms.  See table(s) above for measurements and observations. Electronically signed by Harold Barban MD on 09/28/2020 at 7:43:49 PM.    Final    - pertinent xrays, CT, MRI studies were reviewed and independently interpreted  Positive ROS: All other systems have been reviewed and were  otherwise negative with the exception of those mentioned in the HPI and as above.  Physical Exam: General: Alert, no acute distress Psychiatric: Patient is competent for consent with normal mood and affect Lymphatic: No axillary or cervical lymphadenopathy Cardiovascular: No pedal edema Respiratory: No cyanosis, no use of accessory musculature GI: No organomegaly, abdomen is soft and non-tender    Images:  @ENCIMAGES @  Labs:  Lab Results  Component Value Date   HGBA1C 7.2 (H) 09/26/2020   ESRSEDRATE >140 (H) 09/26/2020   REPTSTATUS PENDING 09/26/2020   GRAMSTAIN NO WBC SEEN MODERATE GRAM NEGATIVE RODS  09/26/2020   CULT  09/26/2020    ABUNDANT SERRATIA MARCESCENS MODERATE PSEUDOMONAS AERUGINOSA    LABORGA SERRATIA MARCESCENS 09/26/2020    Lab Results  Component Value Date   ALBUMIN 2.0 (L) 09/26/2020   ALBUMIN 2.3 (L) 09/16/2020    Neurologic: Patient does not have protective sensation bilateral lower extremities.   MUSCULOSKELETAL:   Skin: Examination patient's right foot is cool to the touch the toes are purple dark and ischemic.  There is a large ischemic ulcer dorsally over the foot with exposed necrotic bone across the midfoot.  Patient has a strong femoral pulse but does not have palpable pulses at the ankle.  Review of the ankle-brachial indices shows significant calcification with ABIs greater than 1 monophasic flow.  Patient does not have active motor function the foot she has foot drop does not have active plantar flexion dorsiflexion inversion or eversion.  Patient states that she ambulates without a brace.  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  Plan: Plan: Discussed that her only option is to proceed with a transtibial amputation discussed that with her calcified vascular disease she is an increased risk of the wound not healing.  Patient  states she understands wished to proceed with surgery at this time patient is scheduled for transferred to [facility].  Will plan for surgery on Wednesday.  THank you for the consult and the opportunity to see Ms. Loni Beckwith, MD [facility] [phone] 5:28 PM

## 2020-09-30 ENCOUNTER — Other Ambulatory Visit: Payer: Self-pay | Admitting: Physician Assistant

## 2020-09-30 LAB — FOLATE: Folate: 16 ng/mL (ref >5.9)

## 2020-09-30 LAB — VITAMIN B12: Vitamin B-12: 2346 pg/mL — ABNORMAL HIGH (ref 180–914)

## 2020-09-30 LAB — GLUCOSE, CAPILLARY
Glucose-Capillary: 110 mg/dL — ABNORMAL HIGH (ref 70–99)
Glucose-Capillary: 113 mg/dL — ABNORMAL HIGH (ref 70–99)
Glucose-Capillary: 152 mg/dL — ABNORMAL HIGH (ref 70–99)
Glucose-Capillary: 97 mg/dL (ref 70–99)

## 2020-09-30 LAB — IRON AND TIBC
Iron: 11 ug/dL — ABNORMAL LOW (ref 28–170)
Saturation Ratios: 10 % — ABNORMAL LOW (ref 10.4–31.8)
TIBC: 106 ug/dL — ABNORMAL LOW (ref 250–450)
UIBC: 95 ug/dL

## 2020-09-30 LAB — AEROBIC CULTURE W GRAM STAIN (SUPERFICIAL SPECIMEN): Gram Stain: NONE SEEN

## 2020-09-30 LAB — RETICULOCYTES
Immature Retic Fract: 14.3 % (ref 2.3–15.9)
RBC.: 2.13 MIL/uL — ABNORMAL LOW (ref 3.87–5.11)
Retic Count, Absolute: 35.8 10*3/uL (ref 19.0–186.0)
Retic Ct Pct: 1.7 % (ref 0.4–3.1)

## 2020-09-30 LAB — SURGICAL PCR SCREEN
MRSA, PCR: NEGATIVE
Staphylococcus aureus: POSITIVE — AB

## 2020-09-30 LAB — FERRITIN: Ferritin: 244 ng/mL (ref 11–307)

## 2020-09-30 MED ORDER — JUVEN PO PACK
1.0000 | PACK | Freq: Two times a day (BID) | ORAL | Status: DC
  Administered 2020-10-01 – 2020-10-06 (×9): 1 via ORAL
  Filled 2020-09-30 (×11): qty 1

## 2020-09-30 MED ORDER — ENSURE MAX PROTEIN PO LIQD
11.0000 [oz_av] | Freq: Every day | ORAL | Status: DC
  Administered 2020-09-30 – 2020-10-02 (×3): 11 [oz_av] via ORAL
  Administered 2020-10-03: 237 mL via ORAL
  Administered 2020-10-04 – 2020-10-05 (×2): 11 [oz_av] via ORAL

## 2020-09-30 MED ORDER — CHLORHEXIDINE GLUCONATE CLOTH 2 % EX PADS
6.0000 | MEDICATED_PAD | Freq: Every day | CUTANEOUS | Status: AC
  Administered 2020-10-01 – 2020-10-04 (×4): 6 via TOPICAL

## 2020-09-30 MED ORDER — LACTATED RINGERS IV SOLN: INTRAVENOUS | Status: AC

## 2020-09-30 MED ORDER — MUPIROCIN 2 % EX OINT
1.0000 "application " | TOPICAL_OINTMENT | Freq: Two times a day (BID) | CUTANEOUS | Status: AC
  Administered 2020-09-30 – 2020-10-05 (×10): 1 via NASAL
  Filled 2020-09-30 (×5): qty 22

## 2020-09-30 MED ORDER — ADULT MULTIVITAMIN W/MINERALS CH
1.0000 | ORAL_TABLET | Freq: Every day | ORAL | Status: DC
  Administered 2020-09-30 – 2020-10-06 (×7): 1 via ORAL
  Filled 2020-09-30 (×6): qty 1

## 2020-09-30 NOTE — Progress Notes (Signed)
Physical Therapy Treatment Patient Details Name: [name] MRN: [id number] DOB: [birth year] Today's Date: 09/30/2020    History of Present Illness 59 yo female admitted s/p fall and acute kidney injury. pt's R leg has cellulitis and abscesses and chronic diabetic foot infection . PMH DM type 1 with insulin pump, diabetic foot ulcers CKDIII with hx of pancreatic and renal transplant on immunosuppression therapy and chronic prednisone    PT Comments    Patient participating in in bed therex this session reports foot too painful to attempt EOB activity.  Reports plans for amputation tomorrow.  Likely will need SNF level rehab at d/c.  PT to follow up after surgery.    Follow Up Recommendations  SNF     Equipment Recommendations  None recommended by PT    Recommendations for Other Services       Precautions / Restrictions Precautions Precautions: Fall Precaution Comments: risk for skin break down    Mobility  Bed Mobility               General bed mobility comments: limited to supine therex today  Transfers                    Ambulation/Gait                 Stairs             Wheelchair Mobility    Modified Rankin (Stroke Patients Only)       Balance                                            Cognition Arousal/Alertness: Awake/alert Behavior During Therapy: WFL for tasks assessed/performed Overall Cognitive Status: Within Functional Limits for tasks assessed                                 General Comments: aware she will go for amputation tomorrow, appropriately requesting in bed therapy due to pain in her foot      Exercises General Exercises - Upper Extremity Shoulder Flexion: AROM;10 reps;Both;Supine Shoulder ABduction: AROM;10 reps;Supine;Both Elbow Flexion: AROM;Both;10 reps;Supine General Exercises - Lower Extremity Ankle Circles/Pumps: AROM;Left;10 reps;Supine Quad Sets:  AROM;Both;10 reps;Supine Gluteal Sets: AROM;Both;10 reps;Supine Other Exercises Other Exercises: reaches and rows bilat UE supine x 10    General Comments        Pertinent Vitals/Pain Faces Pain Scale: Hurts whole lot Pain Location: R foot Pain Descriptors / Indicators: Grimacing;Aching Pain Intervention(s): Monitored during session;Limited activity within patient's tolerance;Repositioned    Home Living                      Prior Function            PT Goals (current goals can now be found in the care plan section) Progress towards PT goals: Not progressing toward goals - comment    Frequency    Min 3X/week      PT Plan Current plan remains appropriate    Co-evaluation              AM-PAC PT "6 Clicks" Mobility   Outcome Measure  Help needed turning from your back to your side while in a flat bed without using bedrails?: A Lot Help needed moving from lying on your back to  sitting on the side of a flat bed without using bedrails?: A Lot Help needed moving to and from a bed to a chair (including a wheelchair)?: Total Help needed standing up from a chair using your arms (e.g., wheelchair or bedside chair)?: Total Help needed to walk in hospital room?: Total Help needed climbing 3-5 steps with a railing? : Total 6 Click Score: 8    End of Session   Activity Tolerance: Patient tolerated treatment well Patient left: in bed;with call bell/phone within reach   PT Visit Diagnosis: Muscle weakness (generalized) (M62.81);History of falling (Z91.81);Difficulty in walking, not elsewhere classified (R26.2);Pain Pain - Right/Left: Right Pain - part of body: Ankle and joints of foot     Time: 3010-4045 PT Time Calculation (min) (ACUTE ONLY): 15 min  Charges:  $Therapeutic Exercise: 8-22 mins                     Magda Kiel, PT Acute Rehabilitation Services VPLWU:[phone] Office:[phone] 09/30/2020    [name] 09/30/2020, 5:06 PM

## 2020-09-30 NOTE — Progress Notes (Signed)
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/chronic prednisone was brought to the emergency room with generalized weakness, fell on the floor and could not get herself up found by a neighbor who called EMS -In the ED noted to be in DKA with CBG of 734 and pH of 7.1 , also noted to have AKI on CKD, also found to have worsening wounds with ischemic changes/gangrenous in her toes  -She was admitted to [facility] long hospital, care discussed with vascular surgery and orthopedics -Transferred to [facility] from [facility] today for 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   Assessment & Plan:   Diabetic ketoacidosis Type 1 diabetes mellitus uncontrolled with hyperglycemia Taken off insulin pump on admission, treated with insulin drip and IV fluids,  -started on Levemir  -CBG stable now, continue Levemir and sliding scale insulin -Check hemoglobin A1c  AKI on CKD 3a Kidney 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 -Renal function improving, creatinine was 3.5 on admission, baseline around 1.3-1.5, likely prerenal, improving with hydration, lisinopril stopped -Case was discussed with nephrology on admission who recommended to continue home dose of prednisone cyclosporine and azathioprine -Renal ultrasound without hydronephrosis -Creatinine down to 1.6 today, cut down IV fluids  Right leg cellulitis and abscess,  chronic diabetic foot infection/pressure ulcer left foot: Peripheral vascular disease with ischemic changes and gangrene of toes -She was started on ceftriaxone and Flagyl on admission -Superficial cultures with Pseudomonas and Serratia -Antibiotics changed to cefepime and vancomycin -Orthopedics  consulting, seen by Dr. Sharol Given -Arterial studies suggestive of chronic arterial insufficiency and has chronic deformity with nonhealing wound and instability -Plan for transtibial amputation tomorrow  Acute on chronic anemia -Likely worsened in the setting of hemodilution -Patient denies hematemesis or melena however has bleeding wound on her right foot -Check anemia panel, anticipate need for transfusion postop  Hypertension:  -Continue metoprolol, lisinopril discontinued  Physical debility: Work with PT OT.     DVT prophylaxis: heparin injection 5,000 Units Start: 09/28/20 2200   Code Status: Full code Family Communication: No family at bedside, discussed with patient in detail Disposition Plan: Remains inpatient appropriate because:Inpatient level of care appropriate due to severity of illness   Dispo: The patient is from: Home              Anticipated d/c is to: Home with home health services.              Anticipated d/c date is: Unknown at this time.  Likely 3 days              Patient currently is not medically stable to d/c.   Consultants:   None.  Nephrology curbside  Vascular surgery  Orthopedic surgery  Procedures:   None  Antimicrobials:  Antibiotics Given (last 72 hours)    Date/Time Action Medication Dose Rate   09/27/20 1700 New Bag/Given   metroNIDAZOLE (FLAGYL) IVPB 500 mg 500 mg 100 mL/hr   09/27/20 2137 New Bag/Given   cefTRIAXone (ROCEPHIN) 1 g in sodium chloride 0.9 % 100 mL IVPB 1 g 200 mL/hr   09/28/20 0047 New Bag/Given   metroNIDAZOLE (FLAGYL) IVPB 500 mg 500 mg 100 mL/hr   09/28/20 1031 New Bag/Given   ceFEPIme (MAXIPIME) 2 g in sodium chloride  0.9 % 100 mL IVPB 2 g 200 mL/hr   09/28/20 1156 New Bag/Given   vancomycin (VANCOCIN) IVPB 1000 mg/200 mL premix 1,000 mg 200 mL/hr   09/28/20 1256 New Bag/Given   clindamycin (CLEOCIN) IVPB 600 mg 600 mg 100 mL/hr   09/28/20 2158 New Bag/Given   clindamycin (CLEOCIN) IVPB 600 mg 600 mg 100  mL/hr   09/29/20 0519 New Bag/Given   clindamycin (CLEOCIN) IVPB 600 mg 600 mg 100 mL/hr   09/29/20 1010 New Bag/Given   ceFEPIme (MAXIPIME) 2 g in sodium chloride 0.9 % 100 mL IVPB 2 g 200 mL/hr   09/29/20 1310 New Bag/Given   clindamycin (CLEOCIN) IVPB 600 mg 600 mg 100 mL/hr   09/29/20 2227 New Bag/Given   clindamycin (CLEOCIN) IVPB 600 mg 600 mg 100 mL/hr   09/30/20 0005 New Bag/Given   vancomycin (VANCOREADY) IVPB 500 mg/100 mL 500 mg 100 mL/hr   09/30/20 4627 New Bag/Given   clindamycin (CLEOCIN) IVPB 600 mg 600 mg 100 mL/hr   09/30/20 1022 New Bag/Given   ceFEPIme (MAXIPIME) 2 g in sodium chloride 0.9 % 100 mL IVPB 2 g 200 mL/hr     Anti-infectives (From admission, onward)   Start     Dose/Rate Route Frequency Ordered Stop   09/30/20 0000  vancomycin (VANCOREADY) IVPB 500 mg/100 mL  Status:  Discontinued        500 mg 100 mL/hr over 60 Minutes Intravenous Every 36 hours 09/28/20 1215 09/29/20 0825   09/29/20 2300  vancomycin (VANCOREADY) IVPB 500 mg/100 mL  Status:  Discontinued        500 mg 100 mL/hr over 60 Minutes Intravenous Every 36 hours 09/28/20 1025 09/28/20 1244   09/29/20 2200  vancomycin (VANCOREADY) IVPB 500 mg/100 mL        500 mg 100 mL/hr over 60 Minutes Intravenous Every 24 hours 09/29/20 0825     09/29/20 1000  ceFEPIme (MAXIPIME) 1 g in sodium chloride 0.9 % 100 mL IVPB  Status:  Discontinued        1 g 200 mL/hr over 30 Minutes Intravenous Every 24 hours 09/28/20 1048 09/29/20 0824   09/29/20 1000  ceFEPIme (MAXIPIME) 2 g in sodium chloride 0.9 % 100 mL IVPB        2 g 200 mL/hr over 30 Minutes Intravenous Every 24 hours 09/29/20 0824     09/28/20 1200  clindamycin (CLEOCIN) IVPB 600 mg        600 mg 100 mL/hr over 30 Minutes Intravenous Every 8 hours 09/28/20 0919 10/01/20 1359   09/28/20 1100  vancomycin (VANCOCIN) IVPB 1000 mg/200 mL premix        1,000 mg 200 mL/hr over 60 Minutes Intravenous  Once 09/28/20 1023 09/28/20 1256   09/28/20 0945   ceFEPIme (MAXIPIME) 2 g in sodium chloride 0.9 % 100 mL IVPB        2 g 200 mL/hr over 30 Minutes Intravenous  Once 09/28/20 0937 09/28/20 1101   09/28/20 0930  vancomycin (VANCOCIN) IVPB 1000 mg/200 mL premix  Status:  Discontinued        1,000 mg 200 mL/hr over 60 Minutes Intravenous  Once 09/28/20 0918 09/28/20 0919   09/28/20 0930  vancomycin (VANCOCIN) IVPB 1000 mg/200 mL premix  Status:  Discontinued        1,000 mg 200 mL/hr over 60 Minutes Intravenous  Once 09/28/20 0919 09/28/20 1021   09/28/20 0930  piperacillin-tazobactam (ZOSYN) IVPB 3.375 g  Status:  Discontinued  3.375 g 100 mL/hr over 30 Minutes Intravenous  Once 09/28/20 0919 09/28/20 0937   09/27/20 1700  metroNIDAZOLE (FLAGYL) IVPB 500 mg  Status:  Discontinued        500 mg 100 mL/hr over 60 Minutes Intravenous Every 8 hours 09/27/20 1616 09/28/20 0917   09/26/20 2245  cefTRIAXone (ROCEPHIN) 1 g in sodium chloride 0.9 % 100 mL IVPB  Status:  Discontinued        1 g 200 mL/hr over 30 Minutes Intravenous Every 24 hours 09/26/20 2244 09/28/20 0917         Subjective: Feels okay, complains of soreness in her foot, denies any dyspnea nausea or vomiting  Objective: Vitals:   09/29/20 1419 09/29/20 1930 09/30/20 0446 09/30/20 0719  BP: 124/68 125/66 (!) 141/72 135/70  Pulse: 85 84 83 81  Resp: 18 16 17 17   Temp: 99.6 F (37.6 C) 97.9 F (36.6 C) 97.7 F (36.5 C) 98.2 F (36.8 C)  TempSrc: Oral Oral Oral Oral  SpO2: 93% 94% 94% 95%  Weight:      Height:        Intake/Output Summary (Last 24 hours) at 09/30/2020 1102 Last data filed at 09/30/2020 1021 Gross per 24 hour  Intake 654.23 ml  Output 950 ml  Net -295.77 ml   Filed Weights   09/26/20 1452 09/28/20 0500 09/29/20 0500  Weight: 45.4 kg 47.4 kg 49.1 kg    Examination:  General exam: Chronically ill-appearing female appears much older than stated age, awake alert oriented to self, place and partly to time , Regular rate rhythm Lungs:  Decreased breath sounds the bases otherwise clear Abdomen: Soft, nontender, bowel sounds present Extremities: Deformed internally rotated right foot with large wound, swelling necrotic margins, ischemic changes on toes of right foot Skin: Diffuse ecchymosis.       Data Reviewed: I have personally reviewed following labs and imaging studies  CBC: Recent Labs  Lab 09/26/20 1534 09/26/20 1656 09/28/20 0215 09/29/20 0639  WBC 15.5*  --  7.3 6.4  NEUTROABS 14.0*  --  6.0 5.4  HGB 7.7* 7.8* 7.7* 7.4*  HCT 25.0* 23.0* 24.3* 23.7*  MCV 114.7*  --  112.5* 111.8*  PLT 408*  --  331 998   Basic Metabolic Panel: Recent Labs  Lab 09/26/20 1534 09/26/20 1534 09/26/20 1656 09/26/20 2245 09/27/20 0245 09/27/20 0432 09/28/20 0215 09/29/20 0639  NA 124*   < > 124* 131* 133*  --  133* 133*  K 5.7*   < > 5.7* 4.8 4.8  --  5.0 5.2*  CL 91*   < > 94* 97* 99  --  104 104  CO2 9*  --   --  18* 20*  --  19* 20*  GLUCOSE 734*   < > 679* 395* 211*  --  124* 197*  BUN 106*   < > 115* 99* 91*  --  88* 78*  CREATININE 3.63*   < > 3.50* 3.14* 3.10*  --  2.30* 1.65*  CALCIUM 8.2*  --   --  8.3* 8.1*  --  8.0* 8.6*  MG  --   --   --   --   --  1.8 1.9  --   PHOS  --   --   --   --   --  4.4 3.2  --    < > = values in this interval not displayed.   GFR: Estimated Creatinine Clearance: 28.1 mL/min (A) (by C-G formula based  on SCr of 1.65 mg/dL (H)). Liver Function Tests: Recent Labs  Lab 09/26/20 1534  AST 22  ALT 23  ALKPHOS 96  BILITOT 2.5*  PROT 5.4*  ALBUMIN 2.0*   No results for input(s): LIPASE, AMYLASE in the last 168 hours. No results for input(s): AMMONIA in the last 168 hours. Coagulation Profile: No results for input(s): INR, PROTIME in the last 168 hours. Cardiac Enzymes: Recent Labs  Lab 09/26/20 1634  CKTOTAL 94   BNP (last 3 results) No results for input(s): PROBNP in the last 8760 hours. HbA1C: No results for input(s): HGBA1C in the last 72 hours. CBG: Recent  Labs  Lab 09/29/20 0808 09/29/20 1149 09/29/20 1652 09/29/20 2157 09/30/20 0635  GLUCAP 212* 184* 142* 118* 110*   Lipid Profile: No results for input(s): CHOL, HDL, LDLCALC, TRIG, CHOLHDL, LDLDIRECT in the last 72 hours. Thyroid Function Tests: No results for input(s): TSH, T4TOTAL, FREET4, T3FREE, THYROIDAB in the last 72 hours. Anemia Panel: No results for input(s): VITAMINB12, FOLATE, FERRITIN, TIBC, IRON, RETICCTPCT in the last 72 hours. Sepsis Labs: Recent Labs  Lab 09/26/20 1535  LATICACIDVEN 1.3    Recent Results (from the past 240 hour(s))  Culture, blood (routine x 2)     Status: None (Preliminary result)   Collection Time: 09/26/20  4:45 PM   Specimen: BLOOD  Result Value Ref Range Status   Specimen Description   Final    BLOOD LEFT ANTECUBITAL Performed at [facility] [address] [place], [place] [address]    Special Requests   Final    BOTTLES DRAWN AEROBIC AND ANAEROBIC Blood Culture adequate volume Performed at [facility] [address], [place], [place] [address]    Culture   Final    NO GROWTH 4 DAYS Performed at [facility], [place] [address], [place], [place] [address]    Report Status PENDING  Incomplete  Culture, blood (routine x 2)     Status: None (Preliminary result)   Collection Time: 09/26/20  4:55 PM   Specimen: BLOOD RIGHT FOREARM  Result Value Ref Range Status   Specimen Description   Final    BLOOD RIGHT FOREARM Performed at [facility] [address] [place], [place] [address]    Special Requests   Final    BOTTLES DRAWN AEROBIC AND ANAEROBIC Blood Culture results may not be optimal due to an inadequate volume of blood received in culture bottles Performed at [facility] [address] [place], [place] [address]    Culture   Final    NO GROWTH 4 DAYS Performed at [facility], [place] [address], [place], [place] [address]    Report  Status PENDING  Incomplete  Respiratory Panel by RT PCR (Flu A&B, Covid) - Nasopharyngeal Swab     Status: None   Collection Time: 09/26/20  5:06 PM   Specimen: Nasopharyngeal Swab  Result Value Ref Range Status   SARS Coronavirus 2 by RT PCR NEGATIVE NEGATIVE Final    Comment: (NOTE) SARS-CoV-2 target nucleic acids are NOT DETECTED.  The SARS-CoV-2 RNA is generally detectable in upper respiratoy specimens during the acute phase of infection. The lowest concentration of SARS-CoV-2 viral copies this assay can detect is 131 copies/mL. A negative result does not preclude SARS-Cov-2 infection and should not be used as the sole basis for treatment or other patient management decisions. A negative result may occur with  improper specimen collection/handling, submission of  specimen other than nasopharyngeal swab, presence of viral mutation(s) within the areas targeted by this assay, and inadequate number of viral copies (<131 copies/mL). A negative result must be combined with clinical observations, patient history, and epidemiological information. The expected result is Negative.  Fact Sheet for Patients:  PinkCheek.be  Fact Sheet for Healthcare Providers:  GravelBags.it  This test is no t yet approved or cleared by the [place] FDA and  has been authorized for detection and/or diagnosis of SARS-CoV-2 by FDA under an Emergency Use Authorization (EUA). This EUA will remain  in effect (meaning this test can be used) for the duration of the COVID-19 declaration under Section 564(b)(1) of the Act, 21 U.S.C. section 360bbb-3(b)(1), unless the authorization is terminated or revoked sooner.     Influenza A by PCR NEGATIVE NEGATIVE Final   Influenza B by PCR NEGATIVE NEGATIVE Final    Comment: (NOTE) The Xpert Xpress SARS-CoV-2/FLU/RSV assay is intended as an aid in  the diagnosis of influenza from Nasopharyngeal swab specimens  and  should not be used as a sole basis for treatment. Nasal washings and  aspirates are unacceptable for Xpert Xpress SARS-CoV-2/FLU/RSV  testing.  Fact Sheet for Patients: PinkCheek.be  Fact Sheet for Healthcare Providers: GravelBags.it  This test is not yet approved or cleared by the [place] FDA and  has been authorized for detection and/or diagnosis of SARS-CoV-2 by  FDA under an Emergency Use Authorization (EUA). This EUA will remain  in effect (meaning this test can be used) for the duration of the  Covid-19 declaration under Section 564(b)(1) of the Act, 21  U.S.C. section 360bbb-3(b)(1), unless the authorization is  terminated or revoked. Performed at [facility], [place] [address], [place], [place] [address]   Aerobic Culture (superficial specimen)     Status: None   Collection Time: 09/26/20  5:34 PM   Specimen: Foot; Wound  Result Value Ref Range Status   Specimen Description   Final    FOOT RIGHT Performed at [facility] [address] [place], [place] [address]    Special Requests   Final    NONE Performed at [facility], [place] [address] [place], [place] [address]    Gram Stain   Final    NO WBC SEEN MODERATE GRAM NEGATIVE RODS Performed at [facility], [place] [address], [place],  [address]    Culture   Final    ABUNDANT SERRATIA MARCESCENS MODERATE PSEUDOMONAS AERUGINOSA    Report Status 09/30/2020 FINAL  Final   Organism ID, Bacteria SERRATIA MARCESCENS  Final   Organism ID, Bacteria PSEUDOMONAS AERUGINOSA  Final      Susceptibility   Pseudomonas aeruginosa - MIC*    CEFTAZIDIME 4 SENSITIVE Sensitive     CIPROFLOXACIN 1 SENSITIVE Sensitive     GENTAMICIN <=1 SENSITIVE Sensitive     IMIPENEM 2 SENSITIVE Sensitive     * MODERATE PSEUDOMONAS AERUGINOSA   Serratia marcescens - MIC*    CEFAZOLIN >=64 RESISTANT Resistant       CEFEPIME <=0.12 SENSITIVE Sensitive     CEFTAZIDIME <=1 SENSITIVE Sensitive     CEFTRIAXONE <=0.25 SENSITIVE Sensitive     CIPROFLOXACIN <=0.25 SENSITIVE Sensitive     GENTAMICIN <=1 SENSITIVE Sensitive     TRIMETH/SULFA <=20 SENSITIVE Sensitive     * ABUNDANT SERRATIA MARCESCENS  MRSA PCR Screening     Status: None   Collection Time: 09/27/20  4:32 AM   Specimen: Nasal  Mucosa; Nasopharyngeal  Result Value Ref Range Status   MRSA by PCR NEGATIVE NEGATIVE Final    Comment:        The GeneXpert MRSA Assay (FDA approved for NASAL specimens only), is one component of a comprehensive MRSA colonization surveillance program. It is not intended to diagnose MRSA infection nor to guide or monitor treatment for MRSA infections. Performed at [facility], [place] [address] [place], [place] [address]          Radiology Studies: No results found.      Scheduled Meds: . atorvastatin  10 mg Oral Daily  . azaTHIOprine  50 mg Oral Daily  . Chlorhexidine Gluconate Cloth  6 each Topical Daily  . cycloSPORINE  75 mg Oral BID  . heparin injection (subcutaneous)  5,000 Units Subcutaneous Q8H  . insulin aspart  0-5 Units Subcutaneous QHS  . insulin aspart  0-6 Units Subcutaneous TID WC  . insulin detemir  0.3 Units/kg Subcutaneous Q24H  . levothyroxine  50 mcg Oral QAC breakfast  . metoprolol tartrate  12.5 mg Oral Daily  . predniSONE  5 mg Oral Q breakfast  . sertraline  25 mg Oral Daily   Continuous Infusions: . ceFEPime (MAXIPIME) IV 2 g (09/30/20 1022)  . clindamycin (CLEOCIN) IV 600 mg (09/30/20 2947)  . lactated ringers 50 mL/hr at 09/30/20 1034  . vancomycin 500 mg (09/30/20 0005)     LOS: 4 days    Time spent: 35 minutes    Domenic Polite, MD [facility] Pager [phone]

## 2020-09-30 NOTE — TOC Initial Note (Signed)
Transition of Care [facility]) - Initial/Assessment Note    Patient Details  Name: [name] MRN: [id number] Date of Birth: 1961/09/11  [facility] CM/SW Contact:    Emeterio Reeve, [place] Phone Number: 09/30/2020, 2:26 PM  Clinical Narrative:                  CSW spoke with pt via phone. CSW introduced self and explained her role at the hospital.  Pt stated PTA she was living at home with her husband. Pt reports she lives on the first floor of her condo and does not have to use stairs. Pt reports she was mostly independent but used a rollator for assistance. Pt stated she was able to complete ADL's.   CSW reviewed pt/ot reccs of snf. Pt stated she believes snf will be beneficial but would like time to discuss with spouse. Pt gave csw permission to fax info out to facilities in the area. Pt reports she has been to SNF once but it was in [place].   Pt reports she has had the covid vaccines.   TOC will continue to follow.  Expected Discharge Plan: Skilled Nursing Facility Barriers to Discharge: Continued Medical Work up   Patient Goals and CMS Choice Patient states their goals for this hospitalization and ongoing recovery are:: to get better CMS Medicare.gov Compare Post Acute Care list provided to:: Patient Choice offered to / list presented to : Patient  Expected Discharge Plan and Services Expected Discharge Plan: [place]       Living arrangements for the past 2 months: Apartment                                      Prior Living Arrangements/Services Living arrangements for the past 2 months: Apartment Lives with:: Spouse Patient language and need for interpreter reviewed:: Yes Do you feel safe going back to the place where you live?: Yes      Need for Family Participation in Patient Care: Yes (Comment) Care giver support system in place?: Yes (comment)   Criminal Activity/Legal Involvement Pertinent to Current  Situation/Hospitalization: No - Comment as needed  Activities of Daily Living Home Assistive Devices/Equipment: Eyeglasses, Environmental consultant (specify type), Other (Comment) (rolator, driving and reading glasses, "omnipod" - pt states this tells her blood sugar every 5 minutes) ADL Screening (condition at time of admission) Patient's cognitive ability adequate to safely complete daily activities?: Yes Is the patient deaf or have difficulty hearing?: No Does the patient have difficulty seeing, even when wearing glasses/contacts?: Yes (hx left eye infection, has blurry vision in left eye) Does the patient have difficulty concentrating, remembering, or making decisions?: No Patient able to express need for assistance with ADLs?: Yes Does the patient have difficulty dressing or bathing?: No Independently performs ADLs?: No Communication: Independent Dressing (OT): Independent Grooming: Independent Feeding: Independent Bathing: Needs assistance Is this a change from baseline?: Change from baseline, expected to last >3 days Toileting: Needs assistance Is this a change from baseline?: Change from baseline, expected to last >3days In/Out Bed: Needs assistance Is this a change from baseline?: Change from baseline, expected to last >3 days Walks in Home: Needs assistance Is this a change from baseline?: Change from baseline, expected to last >3 days Does the patient have difficulty walking or climbing stairs?: Yes Weakness of Legs: Both Weakness of Arms/Hands: Both  Permission Sought/Granted  Permission granted to share info w AGENCY: SNF        Emotional Assessment Appearance:: Appears stated age Attitude/Demeanor/Rapport: Engaged Affect (typically observed): Appropriate Orientation: : Oriented to Situation, Oriented to  Time, Oriented to Place, Oriented to Self Alcohol / Substance Use: Not Applicable Psych Involvement: No (comment)  Admission diagnosis:  Shortness of breath  [R06.02] Diabetic ketoacidosis (HCC) [E11.10] AKI (acute kidney injury) ([place]) [N17.9] DKA, type 1 ([place]) [E10.10] Diabetic ketoacidosis without coma associated with type 1 diabetes mellitus ([place]) [E10.10] Patient Active Problem List   Diagnosis Date Noted  . Gangrene of right foot ([place])   . Subacute osteomyelitis of right foot ([place])   . DKA, type 1 ([place]) 09/27/2020  . Pressure injury of skin 09/27/2020  . Diabetic ketoacidosis ([place]) 09/26/2020  . Acute kidney injury superimposed on CKD ([place]) 09/26/2020  . Cellulitis and abscess of right leg 09/26/2020  . 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 History of simultaneous kidney and pancreas transplant ([place]) 05/14/2020  . Chronic kidney disease (CKD) stage G3a/A1, moderately decreased glomerular filtration rate (GFR) between 45-59 mL/min/1.73 square meter and albuminuria creatinine ratio less than 30 mg/g (HCC) 05/14/2020  . Hypertension 05/14/2020   PCP:  [name], MD Pharmacy:   [id number] [place], [place] [address] [place] [address] Phone: [phone] Fax: [phone]     Social Determinants of Health (SDOH) Interventions    Readmission Risk Interventions No flowsheet data found.  Emeterio Reeve, Latanya Presser, [place] Social Worker [phone]

## 2020-09-30 NOTE — Progress Notes (Signed)
Initial Nutrition Assessment  DOCUMENTATION CODES:   Not applicable  INTERVENTION:   -MVI with minerals daily -1 packet Juven BID, each packet provides 95 calories, 2.5 grams of protein (collagen), and 9.8 grams of carbohydrate (3 grams sugar); also contains 7 grams of L-arginine and L-glutamine, 300 mg vitamin C, 15 mg vitamin E, 1.2 mcg vitamin B-12, 9.5 mg zinc, 200 mg calcium, and 1.5 g  Calcium Beta-hydroxy-Beta-methylbutyrate to support wound healing -Ensure Max po daily, each supplement provides 150 kcal and 30 grams of protein  NUTRITION DIAGNOSIS:   Increased nutrient needs related to wound healing as evidenced by estimated needs.  GOAL:   Patient will meet greater than or equal to 90% of their needs  MONITOR:   PO intake, Supplement acceptance, Labs, Weight trends, Skin, I & O's  REASON FOR ASSESSMENT:   Consult Assessment of nutrition requirement/status  ASSESSMENT:   [name] is a 59 y.o. 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  Pt admitted with DKA.  Reviewed I/O's: +1.6 L x 24 hours and +3.7 L since admission  Per orthopedics notes, plan for transtibial amputation tomorrow (10/01/20).   Spoke with pt at bedside, who was pleasant and in good spirits today. She reports feeling better and having a good appetite. PTA she reports she was a "good eater"; she consumes 3 meals per day (Breakfast: Fiber one cereal OR eggs and english muffin; Lunch: sandwich; Dinner: meat, starch, and vegetable). Pt will also snack on peanut butter crackers and fruit 1-2 times per day. She consumes mostly water.   Pt reports good appetite presently,  consuming most of her food, however, has not received breakfast yet.   Pt reports her UBW has been 120#. Per pt, she used to be heavier several years ago when she received HD treatments prior to her kisney transplant. She explains that she lost weight during HD and has been unable to regain lost weight since transplant, but wt has been stable since then ("I can't seem to put it back on").   Discussed DM control and foot ulcers with pt. She reports she has had chronic rt heel foot ulcer, which is followed by her podiatrist. She noticed it in November last year, which she think developed due to ill fitting shoes. Pt reports plan for transtibial amputation tomorrow and became tearful when discussing it; RD provided comfort and emotional support.   Per pt, her Hgb A1c is usually in the 7 range. She recently started seeing Dr. [place] Poli (endocrinologist) to optimize glycemic control and pt is pleased with the progress she has made. She uses an insulin pump at home and "Dr. [place] Poli has me matching my insulin to my carbohydrate intake". Discussed how DM is treated in the hospital and importance of good glycemic control to promote optimal wound and post-operative healing. Pt does not take vitamins of supplements regularly, but has in the past. She admits "I haven't been doing what I'm supposed to", especially in the 4 days PTA that her husband was out of town. She explains that she became very weak and had to get around by crawling on the floor. She intermittently takes MVI (gummies) and has taken Boost supplements in the past.  Pt is interested in trying MVI, Premier Protein, and Juven.   Lab Results  Component Value Date   HGBA1C 7.2 (H) 09/26/2020   PTA DM medications are Omnipod inuslin pump with novolog basal insulin (total daily 10.2 units/ 24 hours), carb coverage (1:20; 1 unit for every 20 gram of carbohydrate), and 1.88 insulin sensitivity (1 units drops blood glucose 88 mg/dl). .   Labs reviewed: CBGS: 110-184  (inpatient orders for glycemic control are 0-5 units insulin aspart daily at bedtime, 0-6 units inuslin aspart TID with meals, and 14 units insulin detemir daily).   NUTRITION - FOCUSED PHYSICAL EXAM:    Most Recent Value  Orbital Region No depletion  Upper Arm Region No depletion  Thoracic and Lumbar Region No depletion  Buccal Region No depletion  Temple Region No depletion  Clavicle Bone Region No depletion  Clavicle and Acromion Bone Region No depletion  Scapular Bone Region No depletion  Dorsal Hand No depletion  Patellar Region Moderate depletion  Anterior Thigh Region Moderate depletion  Posterior Calf Region Moderate depletion  Edema (RD Assessment) None  Hair Reviewed  Eyes Reviewed  Mouth Reviewed  Skin Reviewed  Nails Reviewed       Diet Order:   Diet Order            Diet Carb Modified Fluid consistency: Thin; Room service appropriate? Yes  Diet effective now                 EDUCATION NEEDS:   Education needs have been addressed  Skin:  Skin Assessment: Skin Integrity Issues: Skin Integrity Issues:: Other (Comment) Other: rt anterior foot lesion, full thickness ulceration of lt lateral and posterior heel, skin tear to sacrum  Last BM:  09/29/20  Height:   Ht Readings from Last 1 Encounters:  09/26/20 5\' 3"  (1.6 m)    Weight:   Wt Readings from Last 1 Encounters:  09/29/20 49.1 kg    Ideal Body Weight:  52.3 kg  BMI:  Body mass index is 19.17 kg/m.  Estimated Nutritional Needs:   Kcal:  1500-1700  Protein:  85-100 grams  Fluid:  > 1.5 L    Loistine Chance, RD, LDN, [place] Registered Dietitian II Certified Diabetes Care and Education Specialist Please refer to [facility] for RD and/or RD on-call/weekend/after hours pager

## 2020-09-30 NOTE — NC FL2 (Signed)
[place] MEDICAID FL2 LEVEL OF CARE SCREENING TOOL     IDENTIFICATION  Patient Name: [name] Birthdate: Jul 28, 1961 Sex: female Admission Date (Current Location): 09/26/2020  [facility] and [place] Number:  Herbalist and Address:  [facility] [place] [address], [place], [place] [address]      Provider Number: [id number]  Attending Physician Name and Address:  Domenic Polite, MD  Relative Name and Phone Number:       Current Level of Care: Hospital Recommended Level of Care: [place] Prior Approval Number:    Date Approved/Denied:   PASRR Number: [id number]  Discharge Plan: SNF    Current Diagnoses: Patient Active Problem List   Diagnosis Date Noted  . Gangrene of right foot ([place])   . Subacute osteomyelitis of right foot ([place])   . DKA, type 1 ([place]) 09/27/2020  . Pressure injury of skin 09/27/2020  . Diabetic ketoacidosis ([place]) 09/26/2020  . Acute kidney injury superimposed on CKD ([place]) 09/26/2020  . Cellulitis and abscess of right leg 09/26/2020  . 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 History of simultaneous kidney and pancreas transplant ([place]) 05/14/2020  . Chronic kidney disease (CKD) stage G3a/A1, moderately decreased glomerular filtration rate (GFR) between 45-59 mL/min/1.73 square meter and albuminuria creatinine ratio less than 30 mg/g ([facility]) 05/14/2020  . Hypertension 05/14/2020    Orientation RESPIRATION BLADDER Height & Weight     Self, Time, Situation, Place  Normal Continent Weight: 108 lb 3.9 oz (49.1 kg) Height:  5\' 3"  (160 cm)  BEHAVIORAL SYMPTOMS/MOOD NEUROLOGICAL BOWEL NUTRITION STATUS      Continent Diet (see discharge summary)  AMBULATORY STATUS COMMUNICATION OF NEEDS Skin   Extensive Assist Verbally PU Stage and Appropriate Care, Skin abrasions (Pressure Injury Sacrum Medial Stage 2 -  Partial thickness loss of dermis presenting as a shallow open injury with a red, pink wound bed  without slough. Diabetic ulcer Foot Right;Anterior, Diabetic ulcer Heel Left w/ two open wounds within)   PU Stage 2 Dressing: BID                   Personal Care Assistance Level of Assistance  Bathing, Feeding, Dressing   Feeding assistance: Independent Dressing Assistance: Maximum assistance     Functional Limitations Info  Sight, Hearing, Speech Sight Info: Adequate Hearing Info: Adequate Speech Info: Adequate    SPECIAL CARE FACTORS FREQUENCY  PT (By licensed PT), OT (By licensed OT)     PT Frequency: 5x a week OT Frequency: 5x a week            Contractures Contractures Info: Not present    Additional Factors Info  Code Status, Allergies, Psychotropic, Insulin Sliding Scale Code Status Info: Full Allergies Info: NKA Psychotropic Info: zoloft Insulin Sliding Scale Info: Novolog 0-6 units       Current Medications (09/30/2020):  This is the current hospital active medication list Current Facility-Administered Medications  Medication Dose Route Frequency Provider Last Rate Last Admin  . acetaminophen (TYLENOL) tablet 650 mg  650 mg Oral Q6H PRN Barb Merino, MD   650 mg at 09/28/20 1518   Or  . acetaminophen (TYLENOL) suppository 650 mg  650 mg Rectal Q6H PRN Barb Merino, MD      . atorvastatin (LIPITOR) tablet 10 mg  10 mg Oral Daily Barb Merino, MD   10 mg at 09/30/20 1028  . azaTHIOprine (IMURAN) tablet 50 mg  50 mg Oral Daily Barb Merino, MD  50 mg at 09/30/20 1029  . ceFEPIme (MAXIPIME) 2 g in sodium chloride 0.9 % 100 mL IVPB  2 g Intravenous Q24H Emiliano Dyer, RPH 200 mL/hr at 09/30/20 1022 2 g at 09/30/20 1022  . Chlorhexidine Gluconate Cloth 2 % PADS 6 each  6 each Topical Daily Barb Merino, MD   6 each at 09/30/20 1036  . clindamycin (CLEOCIN) IVPB 600 mg  600 mg Intravenous Q8H Ghimire, Dante Gang, MD 100 mL/hr at 09/30/20 1323 600 mg at 09/30/20 1323  . cycloSPORINE (SANDIMMUNE) capsule 75 mg  75 mg Oral BID Barb Merino, MD   75  mg at 09/30/20 1029  . dextrose 50 % solution 0-50 mL  0-50 mL Intravenous PRN Barb Merino, MD      . heparin injection 5,000 Units  5,000 Units Subcutaneous Q8H Barb Merino, MD   5,000 Units at 09/30/20 1323  . insulin aspart (novoLOG) injection 0-5 Units  0-5 Units Subcutaneous QHS Barb Merino, MD      . insulin aspart (novoLOG) injection 0-6 Units  0-6 Units Subcutaneous TID WC Barb Merino, MD   1 Units at 09/29/20 1307  . insulin detemir (LEVEMIR) injection 14 Units  0.3 Units/kg Subcutaneous Q24H Barb Merino, MD   14 Units at 09/30/20 1020  . lactated ringers infusion   Intravenous Continuous Domenic Polite, MD 50 mL/hr at 09/30/20 1034 Rate Change at 09/30/20 1034  . levothyroxine (SYNTHROID) tablet 50 mcg  50 mcg Oral QAC breakfast Lenis Noon, [place]   50 mcg at 09/30/20 0165  . metoprolol tartrate (LOPRESSOR) tablet 12.5 mg  12.5 mg Oral Daily Barb Merino, MD   12.5 mg at 09/30/20 1028  . morphine 2 MG/ML injection 2 mg  2 mg Intravenous Q3H PRN Barb Merino, MD   2 mg at 09/30/20 1029  . ondansetron (ZOFRAN) tablet 4 mg  4 mg Oral Q6H PRN Barb Merino, MD       Or  . ondansetron (ZOFRAN) injection 4 mg  4 mg Intravenous Q6H PRN Barb Merino, MD      . predniSONE (DELTASONE) tablet 5 mg  5 mg Oral Q breakfast Barb Merino, MD   5 mg at 09/30/20 1028  . sertraline (ZOLOFT) tablet 25 mg  25 mg Oral Daily Barb Merino, MD   25 mg at 09/30/20 1028  . vancomycin (VANCOREADY) IVPB 500 mg/100 mL  500 mg Intravenous Q24H Emiliano Dyer, RPH 100 mL/hr at 09/30/20 0005 500 mg at 09/30/20 0005     Discharge Medications: Please see discharge summary for a list of discharge medications.  Relevant Imaging Results:  Relevant Lab Results:   Additional Information SSN: [id number]  Emeterio Reeve, [place]

## 2020-10-01 ENCOUNTER — Inpatient Hospital Stay (HOSPITAL_COMMUNITY): Payer: BC Managed Care – PPO | Admitting: Anesthesiology

## 2020-10-01 ENCOUNTER — Encounter (HOSPITAL_COMMUNITY)
Admission: EM | Disposition: A | Discharge status: Skilled Nursing Facility | Payer: Self-pay | Source: Home / Self Care | Attending: Family Medicine

## 2020-10-01 ENCOUNTER — Encounter (HOSPITAL_COMMUNITY): Payer: Self-pay | Admitting: Internal Medicine

## 2020-10-01 DIAGNOSIS — L03115 Cellulitis of right lower limb: Secondary | ICD-10-CM

## 2020-10-01 DIAGNOSIS — E1010 Type 1 diabetes mellitus with ketoacidosis without coma: Secondary | ICD-10-CM | POA: Diagnosis not present

## 2020-10-01 DIAGNOSIS — I96 Gangrene, not elsewhere classified: Secondary | ICD-10-CM | POA: Diagnosis not present

## 2020-10-01 DIAGNOSIS — M86271 Subacute osteomyelitis, right ankle and foot: Secondary | ICD-10-CM | POA: Diagnosis not present

## 2020-10-01 DIAGNOSIS — L02415 Cutaneous abscess of right lower limb: Secondary | ICD-10-CM

## 2020-10-01 HISTORY — PX: AMPUTATION: SHX166

## 2020-10-01 LAB — BASIC METABOLIC PANEL
Anion gap: 8 (ref 5–15)
BUN: 60 mg/dL — ABNORMAL HIGH (ref 6–20)
CO2: 20 mmol/L — ABNORMAL LOW (ref 22–32)
Calcium: 9.2 mg/dL (ref 8.9–10.3)
Chloride: 105 mmol/L (ref 98–111)
Creatinine, Ser: 1.25 mg/dL — ABNORMAL HIGH (ref 0.44–1.00)
GFR calc non Af Amer: 47 mL/min — ABNORMAL LOW (ref >60)
Glucose, Bld: 242 mg/dL — ABNORMAL HIGH (ref 70–99)
Potassium: 5.6 mmol/L — ABNORMAL HIGH (ref 3.5–5.1)
Sodium: 133 mmol/L — ABNORMAL LOW (ref 135–145)

## 2020-10-01 LAB — GLUCOSE, CAPILLARY
Glucose-Capillary: 258 mg/dL — ABNORMAL HIGH (ref 70–99)
Glucose-Capillary: 213 mg/dL — ABNORMAL HIGH (ref 70–99)
Glucose-Capillary: 210 mg/dL — ABNORMAL HIGH (ref 70–99)
Glucose-Capillary: 216 mg/dL — ABNORMAL HIGH (ref 70–99)
Glucose-Capillary: 145 mg/dL — ABNORMAL HIGH (ref 70–99)

## 2020-10-01 LAB — POCT I-STAT, CHEM 8
BUN: 54 mg/dL — ABNORMAL HIGH (ref 6–20)
Calcium, Ion: 1.38 mmol/L (ref 1.15–1.40)
Chloride: 106 mmol/L (ref 98–111)
Creatinine, Ser: 1.3 mg/dL — ABNORMAL HIGH (ref 0.44–1.00)
Glucose, Bld: 226 mg/dL — ABNORMAL HIGH (ref 70–99)
HCT: 21 % — ABNORMAL LOW (ref 36.0–46.0)
Hemoglobin: 7.1 g/dL — ABNORMAL LOW (ref 12.0–15.0)
Potassium: 5.4 mmol/L — ABNORMAL HIGH (ref 3.5–5.1)
Sodium: 136 mmol/L (ref 135–145)
TCO2: 21 mmol/L — ABNORMAL LOW (ref 22–32)

## 2020-10-01 LAB — CULTURE, BLOOD (ROUTINE X 2)
Culture: NO GROWTH
Culture: NO GROWTH
Special Requests: ADEQUATE

## 2020-10-01 LAB — CBC
HCT: 23.7 % — ABNORMAL LOW (ref 36.0–46.0)
Hemoglobin: 7.4 g/dL — ABNORMAL LOW (ref 12.0–15.0)
MCH: 34.1 pg — ABNORMAL HIGH (ref 26.0–34.0)
MCHC: 31.2 g/dL (ref 30.0–36.0)
MCV: 109.2 fL — ABNORMAL HIGH (ref 80.0–100.0)
Platelets: 285 10*3/uL (ref 150–400)
RBC: 2.17 MIL/uL — ABNORMAL LOW (ref 3.87–5.11)
RDW: 18.1 % — ABNORMAL HIGH (ref 11.5–15.5)
WBC: 6.2 10*3/uL (ref 4.0–10.5)
nRBC: 0 % (ref 0.0–0.2)

## 2020-10-01 LAB — ABO/RH: ABO/RH(D): O POS

## 2020-10-01 LAB — TYPE AND SCREEN
ABO/RH(D): O POS
Antibody Screen: NEGATIVE

## 2020-10-01 SURGERY — AMPUTATION BELOW KNEE
Anesthesia: General | Site: Knee | Laterality: Right

## 2020-10-01 MED ORDER — ONDANSETRON HCL 4 MG/2ML IJ SOLN: 4.0000 mg | Freq: Once | INTRAMUSCULAR | Status: DC | PRN

## 2020-10-01 MED ORDER — SODIUM CHLORIDE 0.9 % IV SOLN
510.0000 mg | Freq: Once | INTRAVENOUS | Status: AC
  Administered 2020-10-01: 510 mg via INTRAVENOUS
  Filled 2020-10-01: qty 17

## 2020-10-01 MED ORDER — PHENYLEPHRINE 40 MCG/ML (10ML) SYRINGE FOR IV PUSH (FOR BLOOD PRESSURE SUPPORT)
PREFILLED_SYRINGE | INTRAVENOUS | Status: DC | PRN
  Administered 2020-10-01 (×3): 80 ug via INTRAVENOUS

## 2020-10-01 MED ORDER — 0.9 % SODIUM CHLORIDE (POUR BTL) OPTIME
TOPICAL | Status: DC | PRN
  Administered 2020-10-01: 1000 mL

## 2020-10-01 MED ORDER — MIDAZOLAM HCL 2 MG/2ML IJ SOLN
INTRAMUSCULAR | Status: AC
  Filled 2020-10-01: qty 2

## 2020-10-01 MED ORDER — FENTANYL CITRATE (PF) 100 MCG/2ML IJ SOLN: 25.0000 ug | INTRAMUSCULAR | Status: DC | PRN

## 2020-10-01 MED ORDER — ONDANSETRON HCL 4 MG/2ML IJ SOLN
INTRAMUSCULAR | Status: AC
  Filled 2020-10-01: qty 6

## 2020-10-01 MED ORDER — ACETAMINOPHEN 325 MG PO TABS: 325.0000 mg | ORAL_TABLET | ORAL | Status: DC | PRN

## 2020-10-01 MED ORDER — MIDAZOLAM HCL 2 MG/2ML IJ SOLN
INTRAMUSCULAR | Status: AC
  Administered 2020-10-01: 0.5 mg
  Filled 2020-10-01: qty 2

## 2020-10-01 MED ORDER — DOCUSATE SODIUM 100 MG PO CAPS
100.0000 mg | ORAL_CAPSULE | Freq: Two times a day (BID) | ORAL | Status: DC
  Administered 2020-10-01 – 2020-10-06 (×10): 100 mg via ORAL
  Filled 2020-10-01 (×9): qty 1

## 2020-10-01 MED ORDER — HEPARIN SODIUM (PORCINE) 5000 UNIT/ML IJ SOLN
5000.0000 [IU] | Freq: Three times a day (TID) | INTRAMUSCULAR | Status: DC
  Administered 2020-10-02 – 2020-10-06 (×13): 5000 [IU] via SUBCUTANEOUS
  Filled 2020-10-01 (×12): qty 1

## 2020-10-01 MED ORDER — BUPIVACAINE-EPINEPHRINE (PF) 0.25% -1:200000 IJ SOLN
INTRAMUSCULAR | Status: DC | PRN
  Administered 2020-10-01: 20 mL via PERINEURAL

## 2020-10-01 MED ORDER — ONDANSETRON HCL 4 MG/2ML IJ SOLN
INTRAMUSCULAR | Status: DC | PRN
  Administered 2020-10-01: 4 mg via INTRAVENOUS

## 2020-10-01 MED ORDER — FENTANYL CITRATE (PF) 250 MCG/5ML IJ SOLN
INTRAMUSCULAR | Status: AC
  Filled 2020-10-01: qty 5

## 2020-10-01 MED ORDER — FENTANYL CITRATE (PF) 100 MCG/2ML IJ SOLN
INTRAMUSCULAR | Status: AC
  Administered 2020-10-01: 50 ug
  Filled 2020-10-01: qty 2

## 2020-10-01 MED ORDER — OXYCODONE HCL 5 MG/5ML PO SOLN: 5.0000 mg | Freq: Once | ORAL | Status: DC | PRN

## 2020-10-01 MED ORDER — SODIUM CHLORIDE 0.9 % IV SOLN: INTRAVENOUS | Status: DC

## 2020-10-01 MED ORDER — ACETAMINOPHEN 160 MG/5ML PO SOLN: 325.0000 mg | ORAL | Status: DC | PRN

## 2020-10-01 MED ORDER — CEFAZOLIN SODIUM-DEXTROSE 2-4 GM/100ML-% IV SOLN
2.0000 g | INTRAVENOUS | Status: AC
  Administered 2020-10-01: 2 g via INTRAVENOUS
  Filled 2020-10-01 (×2): qty 100

## 2020-10-01 MED ORDER — CHLORHEXIDINE GLUCONATE 0.12 % MT SOLN
15.0000 mL | OROMUCOSAL | Status: AC
  Administered 2020-10-01: 15 mL via OROMUCOSAL
  Filled 2020-10-01 (×2): qty 15

## 2020-10-01 MED ORDER — LACTATED RINGERS IV SOLN: INTRAVENOUS | Status: DC

## 2020-10-01 MED ORDER — FENTANYL CITRATE (PF) 100 MCG/2ML IJ SOLN
INTRAMUSCULAR | Status: AC
  Administered 2020-10-01: 25 ug via INTRAVENOUS
  Filled 2020-10-01: qty 2

## 2020-10-01 MED ORDER — LIDOCAINE 2% (20 MG/ML) 5 ML SYRINGE
INTRAMUSCULAR | Status: DC | PRN
  Administered 2020-10-01: 20 mg via INTRAVENOUS

## 2020-10-01 MED ORDER — DEXAMETHASONE SODIUM PHOSPHATE 10 MG/ML IJ SOLN
INTRAMUSCULAR | Status: AC
  Filled 2020-10-01: qty 1

## 2020-10-01 MED ORDER — FENTANYL CITRATE (PF) 100 MCG/2ML IJ SOLN
INTRAMUSCULAR | Status: DC | PRN
  Administered 2020-10-01 (×2): 50 ug via INTRAVENOUS

## 2020-10-01 MED ORDER — PROPOFOL 10 MG/ML IV BOLUS
INTRAVENOUS | Status: DC | PRN
  Administered 2020-10-01: 120 mg via INTRAVENOUS

## 2020-10-01 MED ORDER — MEPERIDINE HCL 25 MG/ML IJ SOLN: 6.2500 mg | INTRAMUSCULAR | Status: DC | PRN

## 2020-10-01 MED ORDER — OXYCODONE HCL 5 MG PO TABS: 5.0000 mg | ORAL_TABLET | Freq: Once | ORAL | Status: DC | PRN

## 2020-10-01 MED ORDER — SODIUM CHLORIDE 0.9 % IV SOLN
2.0000 g | Freq: Two times a day (BID) | INTRAVENOUS | Status: DC
  Administered 2020-10-01 – 2020-10-02 (×3): 2 g via INTRAVENOUS
  Filled 2020-10-01 (×3): qty 2

## 2020-10-01 SURGICAL SUPPLY — 41 items
BLADE SAW RECIP 87.9 MT (BLADE) ×2 IMPLANT
BLADE SURG 21 STRL SS (BLADE) ×2 IMPLANT
BNDG COHESIVE 6X5 TAN STRL LF (GAUZE/BANDAGES/DRESSINGS) IMPLANT
CANISTER WOUND CARE 500ML ATS (WOUND CARE) ×2 IMPLANT
COVER SURGICAL LIGHT HANDLE (MISCELLANEOUS) ×2 IMPLANT
COVER WAND RF STERILE (DRAPES) IMPLANT
CUFF TOURN SGL QUICK 34 (TOURNIQUET CUFF) ×1
CUFF TRNQT CYL 34X4.125X (TOURNIQUET CUFF) ×1 IMPLANT
DRAPE INCISE IOBAN 66X45 STRL (DRAPES) ×2 IMPLANT
DRAPE U-SHAPE 47X51 STRL (DRAPES) ×2 IMPLANT
DRESSING PREVENA PLUS CUSTOM (GAUZE/BANDAGES/DRESSINGS) ×1 IMPLANT
DRSG PREVENA PLUS CUSTOM (GAUZE/BANDAGES/DRESSINGS) ×4
DURAPREP 26ML APPLICATOR (WOUND CARE) ×2 IMPLANT
ELECT REM PT RETURN 9FT ADLT (ELECTROSURGICAL) ×2
ELECTRODE REM PT RTRN 9FT ADLT (ELECTROSURGICAL) ×1 IMPLANT
GLOVE BIOGEL PI IND STRL 9 (GLOVE) ×1 IMPLANT
GLOVE BIOGEL PI INDICATOR 9 (GLOVE) ×1
GLOVE SURG ORTHO 9.0 STRL STRW (GLOVE) ×2 IMPLANT
GLOVE SURG SS PI 7.0 STRL IVOR (GLOVE) ×2 IMPLANT
GOWN STRL REUS W/ TWL XL LVL3 (GOWN DISPOSABLE) ×2 IMPLANT
GOWN STRL REUS W/TWL XL LVL3 (GOWN DISPOSABLE) ×2
KIT BASIN OR (CUSTOM PROCEDURE TRAY) ×2 IMPLANT
KIT TURNOVER KIT B (KITS) ×2 IMPLANT
MANIFOLD NEPTUNE II (INSTRUMENTS) ×2 IMPLANT
NS IRRIG 1000ML POUR BTL (IV SOLUTION) ×2 IMPLANT
PACK ORTHO EXTREMITY (CUSTOM PROCEDURE TRAY) ×2 IMPLANT
PAD ARMBOARD 7.5X6 YLW CONV (MISCELLANEOUS) ×2 IMPLANT
PREVENA RESTOR ARTHOFORM 46X30 (CANNISTER) ×2 IMPLANT
PREVENA RESTOR AXIOFORM 29X28 (GAUZE/BANDAGES/DRESSINGS) ×1 IMPLANT
SPONGE LAP 18X18 RF (DISPOSABLE) IMPLANT
STAPLER VISISTAT 35W (STAPLE) IMPLANT
STOCKINETTE IMPERVIOUS LG (DRAPES) ×2 IMPLANT
SUCTION FRAZIER HANDLE 10FR (MISCELLANEOUS) ×1
SUCTION TUBE FRAZIER 10FR DISP (MISCELLANEOUS) IMPLANT
SUT ETHILON 2 0 PSLX (SUTURE) IMPLANT
SUT SILK 2 0 (SUTURE) ×1
SUT SILK 2-0 18XBRD TIE 12 (SUTURE) ×1 IMPLANT
SUT VIC AB 1 CTX 27 (SUTURE) ×4 IMPLANT
TOWEL GREEN STERILE (TOWEL DISPOSABLE) ×2 IMPLANT
TUBE CONNECTING 12X1/4 (SUCTIONS) ×2 IMPLANT
YANKAUER SUCT BULB TIP NO VENT (SUCTIONS) ×2 IMPLANT

## 2020-10-01 NOTE — Plan of Care (Signed)
  Problem: Health Behavior/Discharge Planning: Goal: Ability to manage health-related needs will improve Outcome: Progressing   Problem: Clinical Measurements: Goal: Ability to maintain clinical measurements within normal limits will improve Outcome: Progressing Goal: Will remain free from infection Outcome: Progressing Goal: Diagnostic test results will improve Outcome: Progressing Goal: Respiratory complications will improve Outcome: Progressing Goal: Cardiovascular complication will be avoided Outcome: Progressing   Problem: Activity: Goal: Risk for activity intolerance will decrease Outcome: Progressing   Problem: Nutrition: Goal: Adequate nutrition will be maintained Outcome: Progressing   Problem: Elimination: Goal: Will not experience complications related to bowel motility Outcome: Progressing Goal: Will not experience complications related to urinary retention Outcome: Progressing   Problem: Pain Managment: Goal: General experience of comfort will improve Outcome: Progressing   Problem: Safety: Goal: Ability to remain free from injury will improve Outcome: Progressing   Problem: Skin Integrity: Goal: Risk for impaired skin integrity will decrease Outcome: Progressing   Problem: Clinical Measurements: Goal: Ability to avoid or minimize complications of infection will improve Outcome: Progressing   Problem: Skin Integrity: Goal: Skin integrity will improve Outcome: Progressing   Problem: Education: Goal: Knowledge of disease and its progression will improve Outcome: Progressing   Problem: Fluid Volume: Goal: Compliance with measures to maintain balanced fluid volume will improve Outcome: Progressing   Problem: Health Behavior/Discharge Planning: Goal: Ability to manage health-related needs will improve Outcome: Progressing   Problem: Nutritional: Goal: Ability to make healthy dietary choices will improve Outcome: Progressing   Problem: Clinical  Measurements: Goal: Complications related to the disease process, condition or treatment will be avoided or minimized Outcome: Progressing

## 2020-10-01 NOTE — Anesthesia Preprocedure Evaluation (Addendum)
Anesthesia Evaluation  Patient identified by MRN, date of birth, ID band Patient awake    Reviewed: Allergy & Precautions, NPO status , Patient's Chart, lab work & pertinent test results, reviewed documented beta blocker date and time   History of Anesthesia Complications Negative for: history of anesthetic complications  Airway Mallampati: I  TM Distance: >3 FB Neck ROM: Full    Dental  (+) Caps, Dental Advisory Given   Pulmonary neg pulmonary ROS,  09/26/2020 SARS coronavirus NEG   breath sounds clear to auscultation       Cardiovascular hypertension, Pt. on medications and Pt. on home beta blockers (-) angina Rhythm:Regular Rate:Normal     Neuro/Psych Legs contracted s/p remote spinal surgery    GI/Hepatic negative GI ROS, Neg liver ROS,   Endo/Other  diabetes (glu 210), Insulin DependentHypothyroidism S/p pancreas transplant  Renal/GU Renal InsufficiencyRenal disease (creat 1.25)S/p renal transplant     Musculoskeletal   Abdominal   Peds  Hematology  (+) Blood dyscrasia (Hb 7.4), anemia ,   Anesthesia Other Findings   Reproductive/Obstetrics                            Anesthesia Physical Anesthesia Plan  ASA: III  Anesthesia Plan: General   Post-op Pain Management: GA combined w/ Regional for post-op pain   Induction: Intravenous  PONV Risk Score and Plan: 3 and Ondansetron, Dexamethasone and Treatment may vary due to age or medical condition  Airway Management Planned: LMA  Additional Equipment: None  Intra-op Plan:   Post-operative Plan:   Informed Consent: I have reviewed the patients History and Physical, chart, labs and discussed the procedure including the risks, benefits and alternatives for the proposed anesthesia with the patient or authorized representative who has indicated his/her understanding and acceptance.     Dental advisory given  Plan Discussed with:  CRNA and Surgeon  Anesthesia Plan Comments: (Plan routine monitors, GA with adductor and popliteal blocks for post op analgesia)       Anesthesia Quick Evaluation

## 2020-10-01 NOTE — Progress Notes (Signed)
Orthopedic Tech Progress Note Patient Details:  [name] 01-26-1961 [id number] Called order into [facility]. Patient ID: [name], female   DOB: 1961/07/26, 59 y.o.   MRN: [id number]   [name] 10/01/2020, 5:54 PM

## 2020-10-01 NOTE — Interval H&P Note (Signed)
History and Physical Interval Note:  10/01/2020 6:49 AM  [name]  has presented today for surgery, with the diagnosis of Gangrene Right Foot.  The various methods of treatment have been discussed with the patient and family. After consideration of risks, benefits and other options for treatment, the patient has consented to  Procedure(s): RIGHT BELOW KNEE AMPUTATION (Right) as a surgical intervention.  The patient's history has been reviewed, patient examined, no change in status, stable for surgery.  I have reviewed the patient's chart and labs.  Questions were answered to the patient's satisfaction.     Newt Minion

## 2020-10-01 NOTE — Op Note (Signed)
   Date of Surgery: 10/01/2020  INDICATIONS: [name] is a 59 y.o.-year-old female who presents with gangrene and osteomyelitis of her right foot.  She has diabetic insensate neuropathy she is status post spinal surgery with a flaccid right foot.  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  PROCEDURE: Transtibial amputation Application of Prevena wound VAC  SURGEON: Sharol Given, M.D.  ANESTHESIA:  general  IV FLUIDS AND URINE: See anesthesia.  ESTIMATED BLOOD LOSS: Minimal mL.  COMPLICATIONS: None.  DESCRIPTION OF PROCEDURE: The patient was brought to the operating room and underwent a general anesthetic. After adequate levels of anesthesia were obtained patient's lower extremity was prepped using DuraPrep draped into a sterile field. A timeout was called. The foot was draped out of the sterile field with impervious stockinette. A transverse incision was made 11 cm distal to the tibial tubercle. This curved proximally and a large posterior flap was created. The tibia was transected 1 cm proximal to the skin incision. The fibula was transected just proximal to the tibial incision. The tibia was beveled anteriorly. A large posterior flap was created. The sciatic nerve was pulled cut and allowed to retract. The vascular bundles were suture ligated with 2-0 silk. The deep and superficial fascial layers were closed using #1 Vicryl. The skin was closed using staples and 2-0 nylon. The wound was covered with a Prevena wound VAC. There was a good suction fit. A prosthetic shrinker was applied. Patient was extubated taken to the PACU in stable condition.   DISCHARGE PLANNING:  Antibiotic duration: 24 hours  Weightbearing: Nonweightbearing on the right  Pain medication: Opioid  pathway  Dressing care/ Wound VAC: Wound VAC for 1 week  Discharge to: Anticipate discharge to skilled nursing  Follow-up: In the office 1 week post operative.  Doris Score, MD [place] 4:21 PM

## 2020-10-01 NOTE — Progress Notes (Signed)
Anesthesia made aware of potassium 5.6 and hgb 7.4, ordered type and screen. Order received and carried out

## 2020-10-01 NOTE — Progress Notes (Signed)
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 with prednisone Left heel ulcers in the past followed by wound care [facility]-but has a worsening purulent ulcer on the right dorsum of the foot came to the emergency room feeling weak uncomfortable White count was 15.5 hemoglobin 7.7 BUNs/creatinine 106/3.6 (baseline 1.33) blood glucose 734 Patient found to have DKA started on insulin drip   Assessment & Plan:   Principal Problem:   Diabetic ketoacidosis ([place]) Active Problems:   Anemia secondary to renal failure   History of simultaneous kidney and pancreas transplant ([name])   Chronic kidney disease (CKD) stage G3a/A1, moderately decreased glomerular filtration rate (GFR) between 45-59 mL/min/1.73 square meter and albuminuria creatinine ratio less than 30 mg/g (HCC)   Hypertension   Acute kidney injury superimposed on CKD ([place])   Cellulitis and abscess of right leg   DKA, type 1 ([name])   Pressure injury of skin   Gangrene of right foot ([name])   Subacute osteomyelitis of right foot ([name])   1. DKA on admission now resolved in the setting of TY 1 DM a. DKA physiology resolved soon after admission b. Patient supposed to be on insulin pump but has not been compliant with the same and will need this replaced by sliding scale coverage on discharge c. Expect glycemic control will be somewhat brittle because patient is on steroids d. CBG 100-250 e. Continue Levemir 14 units every 24 hours with very sensitive coverage 2. Purulent cellulitis with diabetic wound on right foot, superficial culture Pseudomonas/Serratia a. Going for surgery under Dr. Sharol Given for transtibial amputation wound not reviewed today b. Continuing broad-spectrum vancomycin clindamycin cefepime at this  time and hopefully can reassess going forward may not require antibiotics post discharge c. Repeat labs a.m. d. Is n.p.o. for procedure 3. Acute kidney injury superimposed on CKD 3--admission creatinine was 3.5 this was likely prerenal improved with hydration a. Held off Zestril 5 b. Renal US without hydronephrosis c. Renal function is significantly improved since admission 4. Kidney 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 a. Continue azathioprine 50 daily, cyclosporine 75 twice daily and prednisone 5 mg daily 5. HTN a. Continue metoprolol 12.5 daily-lisinopril discontinued 6. Acute on chronic anemia a. Patient has macrocytic anemia however iron stores are low with iron 11 TSAT 10 therefore we will give IV iron b. Will need repeat check on labs next 3 weeks  DVT prophylaxis: None present Code Status: Full code Family Communication: Called t d/w Ross muprhy-left VM as didn't reach him Disposition:   Status is: Inpatient  Remains inpatient appropriate because:Persistent severe electrolyte disturbances, Ongoing active pain requiring inpatient pain management, Ongoing diagnostic testing needed not appropriate for outpatient work up and Inpatient level of care appropriate due to severity of illness   Dispo:  Patient From: Home  Planned Disposition: Home  Expected discharge date: 10/03/20  Medically stable for discharge: No    Consultants:   Orthopedics Dr. Sharol Given  Procedures: Planned for transtibial amputation  Antimicrobials: As above   Subjective: Awake coherent pleasant no distress understands the need to be n.p.o. No pain no fever no chills She is ready for surgery  Objective: Vitals:   09/30/20 0719 09/30/20 1900 10/01/20 0412 10/01/20 0722  BP: 135/70 137/68 (!) 151/77 (!) 156/74  Pulse: 81 79 87 84  Resp: 17 16  16 16  Temp: 98.2 F (36.8 C) 97.9 F (36.6 C) 99.1 F (37.3 C) 98 F (36.7 C)  TempSrc: Oral Oral Oral Oral  SpO2: 95% 97% 90% 92%  Weight:       Height:        Intake/Output Summary (Last 24 hours) at 10/01/2020 0749 Last data filed at 10/01/2020 0600 Gross per 24 hour  Intake 620 ml  Output 3000 ml  Net -2380 ml   Filed Weights   09/26/20 1452 09/28/20 0500 09/29/20 0500  Weight: 45.4 kg 47.4 kg 49.1 kg    Examination:  General exam: EOMI however patient does have film over her left eye no icterus no pallor Respiratory system: Clear no added sound Cardiovascular system: B7-S2 holosystolic murmur across precordium Gastrointestinal system: Soft nontender with some lower abdominal ascites and swelling. Central nervous system: Neurologically intact moving all 4 limbs equally without any focal deficit Extremities: No joint contractures however does have significant bandages over feet Skin: Did not examine wound today Psychiatry: Euthymic congruent pleasant  Data Reviewed: I have personally reviewed following labs and imaging studies Potassium 5.6 BUNs/creatinine down to 60/1.2 CO2 20 Iron level 11 saturation ratios 10 Hemoglobin 7.4 WBC 6.2 platelet 285  Radiology Studies: No results found.   Scheduled Meds: . atorvastatin  10 mg Oral Daily  . azaTHIOprine  50 mg Oral Daily  . Chlorhexidine Gluconate Cloth  6 each Topical Daily  . Chlorhexidine Gluconate Cloth  6 each Topical Q0600  . cycloSPORINE  75 mg Oral BID  . heparin injection (subcutaneous)  5,000 Units Subcutaneous Q8H  . insulin aspart  0-5 Units Subcutaneous QHS  . insulin aspart  0-6 Units Subcutaneous TID WC  . insulin detemir  0.3 Units/kg Subcutaneous Q24H  . levothyroxine  50 mcg Oral QAC breakfast  . metoprolol tartrate  12.5 mg Oral Daily  . multivitamin with minerals  1 tablet Oral Daily  . mupirocin ointment  1 application Nasal BID  . nutrition supplement (JUVEN)  1 packet Oral BID BM  . predniSONE  5 mg Oral Q breakfast  . Ensure Max Protein  11 oz Oral QHS  . sertraline  25 mg Oral Daily   Continuous Infusions: .  ceFAZolin  (ANCEF) IV    . ceFEPime (MAXIPIME) IV Stopped (09/30/20 1108)  . vancomycin Stopped (09/30/20 2305)     LOS: 5 days    Time spent: [place], MD [facility] To contact the attending provider between 7A-7P or the covering provider during after hours 7P-7A, please log into the web site [email or web address] and access using universal [place] password for that web site. If you do not have the password, please call the hospital operator.  10/01/2020, 7:49 AM

## 2020-10-01 NOTE — Anesthesia Procedure Notes (Signed)
Procedure Name: LMA Insertion Date/Time: 10/01/2020 3:40 PM Performed by: Leonor Liv, CRNA Pre-anesthesia Checklist: Patient identified, Emergency Drugs available, Suction available and Patient being monitored Patient Re-evaluated:Patient Re-evaluated prior to induction Oxygen Delivery Method: Circle System Utilized Preoxygenation: Pre-oxygenation with 100% oxygen Induction Type: IV induction Ventilation: Mask ventilation without difficulty LMA: LMA inserted LMA Size: 4.0 Number of attempts: 1 Placement Confirmation: positive ETCO2 Tube secured with: Tape Dental Injury: Teeth and Oropharynx as per pre-operative assessment

## 2020-10-01 NOTE — Progress Notes (Signed)
Pharmacy Antibiotic Note  [name] is a 59 y.o. female admitted on 09/26/2020 with cellulitis.  Pharmacy has been consulted for vancomycin & cefepime dosing. Pt on Imuran/cyclosprorine/prednisone for pancreatic & renal transplant  Pt is now on D4 abx. She will get BKA today. Scr has been trending down. We will adjust her cefepime for now.   Plan: Increase Cefepime 2g IV q12   Adjust Vancomycin to 500mg  IV q24h, goal trought 15-20 mcg/ml Vancomycin levels as needed  Height: 5\' 3"  (160 cm) Weight: 49.1 kg (108 lb 3.9 oz) IBW/kg (Calculated) : 52.4  Temp (24hrs), Avg:98.3 F (36.8 C), Min:97.9 F (36.6 C), Max:99.1 F (37.3 C)  Recent Labs  Lab 09/26/20 1534 09/26/20 1535 09/26/20 1656 09/26/20 2245 09/27/20 0245 09/28/20 0215 09/29/20 0639 10/01/20 0336  WBC 15.5*  --   --   --   --  7.3 6.4 6.2  CREATININE 3.63*  --    < > 3.14* 3.10* 2.30* 1.65* 1.25*  LATICACIDVEN  --  1.3  --   --   --   --   --   --    < > = values in this interval not displayed.    Estimated Creatinine Clearance: 37.1 mL/min (A) (by C-G formula based on SCr of 1.25 mg/dL (H)).    No Known Allergies  Antimicrobials this admission:  10/2 CTX> 10/3 10/2 flagyl >> 10/3 10/3 vanc>> 10/3 cefepime>> 10/3 clinda x 3 days>>10/6  Dose adjustments this admission:   Microbiology results:  10/2 MRSA neg 10/1 R foot superficial Cx: abundant Serratia marcescens, mod PsA aeruginosa 10/1 covid/flu A/B neg 10/1 BCx2: ngtd 10/1 HIV NR  Onnie Boer, PharmD, BCIDP, AAHIVP, CPP Infectious Disease Pharmacist 10/01/2020 8:13 AM

## 2020-10-01 NOTE — Anesthesia Procedure Notes (Signed)
Anesthesia Regional Block: Adductor canal block   Pre-Anesthetic Checklist: ,, timeout performed, Correct Patient, Correct Site, Correct Laterality, Correct Procedure, Correct Position, site marked, Risks and benefits discussed,  Surgical consent,  Pre-op evaluation,  At surgeon's request and post-op pain management  Laterality: Right and Lower  Prep: chloraprep       Needles:  Injection technique: Single-shot  Needle Type: Echogenic Needle     Needle Length: 9cm  Needle Gauge: 21     Additional Needles:   Procedures:,,,, ultrasound used (permanent image in chart),,,,  Narrative:  Start time: 10/01/2020 2:59 PM End time: 10/01/2020 3:05 PM  Performed by: Personally  Anesthesiologist: Annye Asa, MD  Additional Notes: Pt identified in Holding room.  Monitors applied. Working IV access confirmed. Sterile prep R thigh.  #21ga ECHOgenic block needle into adductor canal with US guidance.  20cc 0.25% Bupivacaine with 1:200k epi injected incrementally after negative test dose.  Patient asymptomatic, VSS, no heme aspirated, tolerated well.  Jenita Seashore, MD

## 2020-10-01 NOTE — Transfer of Care (Signed)
Immediate Anesthesia Transfer of Care Note  Patient: [name]  Procedure(s) Performed: RIGHT BELOW KNEE AMPUTATION (Right Knee)  Patient Location: PACU  Anesthesia Type:General  Level of Consciousness: awake, alert  and oriented  Airway & Oxygen Therapy: Patient Spontanous Breathing and Patient connected to nasal cannula oxygen  Post-op Assessment: Report given to RN, Post -op Vital signs reviewed and stable and Patient moving all extremities  Post vital signs: Reviewed and stable  Last Vitals:  Vitals Value Taken Time  BP 108/49 10/01/20 1621  Temp    Pulse 73 10/01/20 1628  Resp 17 10/01/20 1628  SpO2 94 % 10/01/20 1628  Vitals shown include unvalidated device data.  Last Pain:  Vitals:   10/01/20 1100  TempSrc:   PainSc: Asleep      Patients Stated Pain Goal: 3 (80/22/33 6122)  Complications: No complications documented.

## 2020-10-01 NOTE — Anesthesia Postprocedure Evaluation (Signed)
Anesthesia Post Note  Patient: Laelani Vasko  Procedure(s) Performed: RIGHT BELOW KNEE AMPUTATION (Right Knee)     Patient location during evaluation: PACU Anesthesia Type: General Level of consciousness: awake and alert, patient cooperative and oriented Pain management: pain level controlled (pt states she is ready to go to her room) Vital Signs Assessment: post-procedure vital signs reviewed and stable Respiratory status: spontaneous breathing, nonlabored ventilation, respiratory function stable and patient connected to nasal cannula oxygen Cardiovascular status: blood pressure returned to baseline and stable Postop Assessment: no apparent nausea or vomiting Anesthetic complications: no   No complications documented.  Last Vitals:  Vitals:   10/01/20 1650 10/01/20 1705  BP: (!) 104/50 (!) 108/49  Pulse: 71 71  Resp: 15 16  Temp:  36.4 C  SpO2: 94% (!) 86%    Last Pain:  Vitals:   10/01/20 1700  TempSrc:   PainSc: 7                  Natalee Tomkiewicz,E. Amun Stemm

## 2020-10-01 NOTE — Progress Notes (Signed)
[name], floor RN, contacted to pick up gold band from short stay surgical. Verbalized understanding.

## 2020-10-02 ENCOUNTER — Encounter (HOSPITAL_COMMUNITY): Payer: Self-pay | Admitting: Orthopedic Surgery

## 2020-10-02 LAB — CBC WITH DIFFERENTIAL/PLATELET
Abs Immature Granulocytes: 0.04 10*3/uL (ref 0.00–0.07)
Basophils Absolute: 0 10*3/uL (ref 0.0–0.1)
Basophils Relative: 0 %
Eosinophils Absolute: 0 10*3/uL (ref 0.0–0.5)
Eosinophils Relative: 0 %
HCT: 25.1 % — ABNORMAL LOW (ref 36.0–46.0)
Hemoglobin: 7.9 g/dL — ABNORMAL LOW (ref 12.0–15.0)
Immature Granulocytes: 1 %
Lymphocytes Relative: 4 %
Lymphs Abs: 0.2 10*3/uL — ABNORMAL LOW (ref 0.7–4.0)
MCH: 35.3 pg — ABNORMAL HIGH (ref 26.0–34.0)
MCHC: 31.5 g/dL (ref 30.0–36.0)
MCV: 112.1 fL — ABNORMAL HIGH (ref 80.0–100.0)
Monocytes Absolute: 0.1 10*3/uL (ref 0.1–1.0)
Monocytes Relative: 3 %
Neutro Abs: 5.3 10*3/uL (ref 1.7–7.7)
Neutrophils Relative %: 92 %
Platelets: 295 10*3/uL (ref 150–400)
RBC: 2.24 MIL/uL — ABNORMAL LOW (ref 3.87–5.11)
RDW: 18.4 % — ABNORMAL HIGH (ref 11.5–15.5)
WBC: 5.7 10*3/uL (ref 4.0–10.5)
nRBC: 0 % (ref 0.0–0.2)

## 2020-10-02 LAB — COMPREHENSIVE METABOLIC PANEL
ALT: 13 U/L (ref 0–44)
AST: 22 U/L (ref 15–41)
Albumin: 1.1 g/dL — ABNORMAL LOW (ref 3.5–5.0)
Alkaline Phosphatase: 68 U/L (ref 38–126)
Anion gap: 8 (ref 5–15)
BUN: 61 mg/dL — ABNORMAL HIGH (ref 6–20)
CO2: 19 mmol/L — ABNORMAL LOW (ref 22–32)
Calcium: 9.5 mg/dL (ref 8.9–10.3)
Chloride: 108 mmol/L (ref 98–111)
Creatinine, Ser: 1.34 mg/dL — ABNORMAL HIGH (ref 0.44–1.00)
GFR calc non Af Amer: 43 mL/min — ABNORMAL LOW (ref >60)
Glucose, Bld: 382 mg/dL — ABNORMAL HIGH (ref 70–99)
Potassium: 6.2 mmol/L — ABNORMAL HIGH (ref 3.5–5.1)
Sodium: 135 mmol/L (ref 135–145)
Total Bilirubin: 1.1 mg/dL (ref 0.3–1.2)
Total Protein: 4.6 g/dL — ABNORMAL LOW (ref 6.5–8.1)

## 2020-10-02 LAB — GLUCOSE, CAPILLARY
Glucose-Capillary: 374 mg/dL — ABNORMAL HIGH (ref 70–99)
Glucose-Capillary: 395 mg/dL — ABNORMAL HIGH (ref 70–99)
Glucose-Capillary: 290 mg/dL — ABNORMAL HIGH (ref 70–99)
Glucose-Capillary: 217 mg/dL — ABNORMAL HIGH (ref 70–99)

## 2020-10-02 MED ORDER — INSULIN ASPART 100 UNIT/ML ~~LOC~~ SOLN
2.0000 [IU] | Freq: Three times a day (TID) | SUBCUTANEOUS | Status: DC
  Administered 2020-10-02 – 2020-10-06 (×7): 2 [IU] via SUBCUTANEOUS

## 2020-10-02 MED ORDER — INSULIN DETEMIR 100 UNIT/ML ~~LOC~~ SOLN
8.0000 [IU] | Freq: Once | SUBCUTANEOUS | Status: AC
  Administered 2020-10-02: 8 [IU] via SUBCUTANEOUS
  Filled 2020-10-02: qty 0.08

## 2020-10-02 MED ORDER — INSULIN ASPART 100 UNIT/ML ~~LOC~~ SOLN
0.0000 [IU] | Freq: Three times a day (TID) | SUBCUTANEOUS | Status: DC
  Administered 2020-10-02: 8 [IU] via SUBCUTANEOUS
  Administered 2020-10-03 – 2020-10-04 (×3): 2 [IU] via SUBCUTANEOUS
  Administered 2020-10-05: 11 [IU] via SUBCUTANEOUS
  Administered 2020-10-05 (×2): 8 [IU] via SUBCUTANEOUS
  Administered 2020-10-06: 3 [IU] via SUBCUTANEOUS

## 2020-10-02 MED ORDER — INSULIN DETEMIR 100 UNIT/ML ~~LOC~~ SOLN
20.0000 [IU] | Freq: Every day | SUBCUTANEOUS | Status: DC
  Administered 2020-10-03 – 2020-10-05 (×3): 20 [IU] via SUBCUTANEOUS
  Filled 2020-10-02 (×4): qty 0.2

## 2020-10-02 MED ORDER — SODIUM ZIRCONIUM CYCLOSILICATE 10 G PO PACK
10.0000 g | PACK | Freq: Two times a day (BID) | ORAL | Status: DC
  Administered 2020-10-02 – 2020-10-06 (×7): 10 g via ORAL
  Filled 2020-10-02 (×9): qty 1

## 2020-10-02 NOTE — Plan of Care (Signed)
  Problem: Health Behavior/Discharge Planning: Goal: Ability to manage health-related needs will improve Outcome: Progressing   Problem: Clinical Measurements: Goal: Ability to maintain clinical measurements within normal limits will improve Outcome: Progressing Goal: Will remain free from infection Outcome: Progressing   Problem: Pain Managment: Goal: General experience of comfort will improve Outcome: Progressing   

## 2020-10-02 NOTE — Progress Notes (Signed)
Inpatient Diabetes Program Recommendations  AACE/ADA: New Consensus Statement on Inpatient Glycemic Control (2015)  Target Ranges:  Prepandial:   less than 140 mg/dL      Peak postprandial:   less than 180 mg/dL (1-2 hours)      Critically ill patients:  140 - 180 mg/dL   Lab Results  Component Value Date   GLUCAP 395 (H) 10/02/2020   HGBA1C 7.2 (H) 09/26/2020    Review of Glycemic Control Results for BUFFY, EHLER (MRN [id number]) as of 10/02/2020 11:48  Ref. Range 10/01/2020 13:42 10/01/2020 16:24 10/01/2020 19:19 10/02/2020 06:17 10/02/2020 11:42  Glucose-Capillary Latest Ref Range: 70 - 99 mg/dL 210 (H) 216 (H) 145 (H) 374 (H) 395 (H)    Inpatient Diabetes Program Recommendations:   Noted Levemir increased to 20 units to start today but held due to 14 units given this am. Sent secure chat to Dr. Verlon Au to inquire if ok to give additional 6 units now to equal total of basal 20 today and start the 20 units in am. Included RN Maygan Rollene Rotunda in conversation. Patient may also need Novolog 3 units tid meal coverage if eats 50% meals.  Thank you, Nani Gasser. Mylan Lengyel, RN, MSN, CDE  Diabetes Coordinator Inpatient Glycemic Control Team Team Pager [phone] (8am-5pm) 10/02/2020 11:54 AM

## 2020-10-02 NOTE — Progress Notes (Signed)
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 Sharol Given 10/6  Assessment & Plan:   Principal Problem:   Diabetic ketoacidosis ([place]) Active Problems:   Anemia secondary to renal failure   History of simultaneous kidney and pancreas transplant ([place])   Chronic kidney disease (CKD) stage G3a/A1, moderately decreased glomerular filtration rate (GFR) between 45-59 mL/min/1.73 square meter and albuminuria creatinine ratio less than 30 mg/g (HCC)   Hypertension   Acute kidney injury superimposed on CKD (HCC)   Cellulitis and abscess of right leg   DKA, type 1 ([place])   Pressure injury of skin   Gangrene of right foot ([place])   Subacute osteomyelitis of right foot ([place])   1. DKA on admission now resolved in the setting of TY 1 DM a. DKA physiology resolved soon after admission b. Patient supposed to be on insulin pump but has not been compliant with the same and will need this replaced by sliding scale coverage on discharge c. Brittle control expected secondary to patient being on steroids d. CBG ranging 373-382 e. Increase Levemir from 14units to 20 units q. 24 f. Have added an extra Levemir 8 units today in addition to the added increase above and will change to moderate sliding scale at least 2. Purulent cellulitis with  diabetic wound on right foot, superficial culture Pseudomonas/Serratia a. Status post amputation right side 10/6 b. Antibiotics discontinued 10/7 3. Acute kidney injury superimposed on CKD 3 4. Hyperkalemia as well as metabolic acidosis 5. admission creatinine was 3.5 this was likely prerenal improved with hydration a. Held off Zestril 5 b. Renal US without hydronephrosis c. Hyperkalemia treated today with Lokelma x1 d. Repeat renal labs in the morning 6. Kidney 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 a. Continue azathioprine 50 daily, cyclosporine 75 twice daily and prednisone 5 mg daily 7. HTN a. Continue metoprolol 12.5 daily-lisinopril discontinued 8. Acute on chronic anemia a. Patient has macrocytic anemia however iron stores are low with iron 11 TSAT 10 therefore we will give IV iron b. Will need repeat check on labs next 3 weeks  DVT prophylaxis: None present Code Status: Full code Family Communication:  Disposition:   Status is: Inpatient  Remains inpatient appropriate because:Persistent severe electrolyte disturbances, Ongoing active pain requiring inpatient pain management, Ongoing diagnostic testing needed not appropriate for outpatient work up and Inpatient level of care appropriate due to severity of illness   Dispo:  Patient From: Home  Planned Disposition: [place]  Expected discharge date: 10/04/20  Medically stable for discharge: No    Consultants:   Orthopedics Dr. Sharol Given  Procedures: Planned for transtibial amputation  Antimicrobials: As above   Subjective:  Quite hard of hearing but overall seems to have no new issues although her pain is 7/10 She is not taking her pain meds and I have encouraged her to take her as needed's No chest pain no fever eating  and drinking  Objective: Vitals:   10/02/20 0426 10/02/20 0810 10/02/20 0906 10/02/20 1011  BP: (!) 109/58 119/69    Pulse: 68 81    Resp: 16 17    Temp: 97.9 F (36.6  C) 97.9 F (36.6 C)    TempSrc: Oral Oral    SpO2: 92% 91% (!) 87% 94%  Weight:      Height:        Intake/Output Summary (Last 24 hours) at 10/02/2020 1037 Last data filed at 10/02/2020 0600 Gross per 24 hour  Intake 2252.14 ml  Output 1550 ml  Net 702.14 ml   Filed Weights   09/28/20 0500 09/29/20 0500 10/01/20 1403  Weight: 47.4 kg 49.1 kg 49.1 kg    Examination:  General exam: EOMI however patient does have film over her left eye no icterus no pallor  Respiratory system: Clear no rales rhonchi or added sound Cardiovascular system: Y1-V4 holosystolic murmur across precordium Gastrointestinal system: Soft nontender with some lower abdominal ascites and swelling. Central nervous system: Neurologically intact moving all 3 limbs--RLE limited buecent amputation and wound vac Extremities: No joint contractures, BKA noted on right side with wound VAC applied Psychiatry: Euthymic congruent pleasant  Data Reviewed: I have personally reviewed following labs and imaging studies Potassium 5.6--> 6.2 BUNs/creatinine down to 60/1.2 ----> 61/1.3 CO2 20-->19 Iron level 11 saturation ratios 10 Hemoglobin 7.0 WBC 5.7 platelet 295  Radiology Studies: No results found.   Scheduled Meds: . atorvastatin  10 mg Oral Daily  . azaTHIOprine  50 mg Oral Daily  . Chlorhexidine Gluconate Cloth  6 each Topical Daily  . Chlorhexidine Gluconate Cloth  6 each Topical Q0600  . cycloSPORINE  75 mg Oral BID  . docusate sodium  100 mg Oral BID  . heparin injection (subcutaneous)  5,000 Units Subcutaneous Q8H  . insulin aspart  0-15 Units Subcutaneous TID WC  . insulin aspart  0-5 Units Subcutaneous QHS  . insulin aspart  2 Units Subcutaneous TID WC  . insulin detemir  20 Units Subcutaneous Daily  . levothyroxine  50 mcg Oral QAC breakfast  . metoprolol tartrate  12.5 mg Oral Daily  . multivitamin with minerals  1 tablet Oral Daily  . mupirocin ointment  1 application Nasal BID  . nutrition  supplement (JUVEN)  1 packet Oral BID BM  . predniSONE  5 mg Oral Q breakfast  . Ensure Max Protein  11 oz Oral QHS  . sertraline  25 mg Oral Daily  . sodium zirconium cyclosilicate  10 g Oral BID   Continuous Infusions: . sodium chloride 75 mL/hr at 10/02/20 0500  . ceFEPime (MAXIPIME) IV 2 g (10/02/20 9449)  . lactated ringers Stopped (10/01/20 1607)  . vancomycin Stopped (10/01/20 2245)     LOS: 6 days    Time spent: [place], MD [facility] To contact the attending provider between 7A-7P or the covering provider during after hours 7P-7A, please log into the web site [email or web address] and access using universal [place] password for that web site. If you do not have the password, please call the hospital operator.  10/02/2020, 10:37 AM

## 2020-10-02 NOTE — Evaluation (Signed)
Occupational Therapy Re-Evaluation Patient Details Name: [name] MRN: [id number] DOB: Aug 26, 1961 Today's Date: 10/02/2020    History of Present Illness 59 yo female admitted s/p fall and acute kidney injury. pt's R leg has cellulitis and abscesses and chronic diabetic foot infection. 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 Pt presents with significant deficits in strength, balance, endurance, cardiopulmonary tolerance and problem-solving. Pt received on RA, SPO2 87% after activity, increasing to 91% with placement of 2 L O2. Pt requires overall Total A x 2 for bed mobility with assistance needed to maintain sitting balance. Pt able to tolerate sitting EOB for 10 min before fatiguing and difficulty maintaining balance. Pt requires Mod A for UB ADLs and Total A for LB ADLs. Recommend maximove transfer to recliner chair for nursing staff to allow for repositioning and maintenance of skin integrity. Plan to coordinate for trial of prevalon boot on L foot to prevent contracture.     Follow Up Recommendations  SNF;Supervision/Assistance - 24 hour    Equipment Recommendations  Wheelchair cushion (measurements OT);Wheelchair (measurements OT);Hospital bed    Recommendations for Other Services       Precautions / Restrictions Precautions Precautions: Fall;Other (comment) Precaution Comments: risk for skin break down, wound vac Restrictions Weight Bearing Restrictions: Yes RLE Weight Bearing: Non weight bearing      Mobility Bed Mobility Overal bed mobility: Needs Assistance Bed Mobility: Supine to Sit;Sit to Supine     Supine to sit: Max assist;+2 for physical assistance;+2 for safety/equipment;HOB elevated Sit to supine: Total assist;+2 for physical assistance;+2 for  safety/equipment   General bed mobility comments: Max A x 2 to sit EOB with assistance to pull self with bed rail though extremely weak. Pt Total Ax 2 to return to supine   Transfers                 General transfer comment: deferred, unsafe to attempt due to poor sitting balance and L LE weakness    Balance Overall balance assessment: Needs assistance Sitting-balance support: Bilateral upper extremity supported;Feet supported Sitting balance-Leahy Scale: Poor Sitting balance - Comments: sat EOB with hands on a chair, sat working on neutral posture, pt kyphotic and sustained posture app 1-2 mins before sinking into a kyphotic posture, leans posteriorly. Pt max at times to maintain posture and min guard with hand supported and foot on the floor Postural control: Posterior lean                                 ADL either performed or assessed with clinical judgement   ADL Overall ADL's : Needs assistance/impaired Eating/Feeding: Set up;Sitting Eating/Feeding Details (indicate cue type and reason): Setup with assistance to open containers Grooming: Supervision/safety;Bed level;Wash/dry face Grooming Details (indicate cue type and reason): Supervision for washing face bed level Upper Body Bathing: Maximal assistance;Bed level   Lower Body Bathing: Total assistance;Bed level   Upper Body Dressing : Moderate assistance;Bed level   Lower Body Dressing: Total assistance;Bed level Lower Body Dressing Details (indicate cue type and reason): Total A to don L sock due to severe LE weakness     Toileting- Clothing Manipulation and Hygiene: Total assistance;Bed level         General  ADL Comments: Pt with decreased strength (UE, core, and LE), endurance, cardiopulmonary tolerance, sitting balance and problem solving      Vision Baseline Vision/History:  (L eye deficits, reports hx of infection and surgery) Patient Visual Report: No change from baseline Vision  Assessment?: Vision impaired- to be further tested in functional context Additional Comments: L eye with cloudy covering. pt reports hx of infection and surgical intervention with possible loss of eye in future. L eye blurry     Perception     Praxis      Pertinent Vitals/Pain Pain Assessment: Faces Faces Pain Scale: Hurts a little bit Pain Location: R Leg Pain Descriptors / Indicators: Discomfort Pain Intervention(s): Limited activity within patient's tolerance;Monitored during session;Premedicated before session;Repositioned     Hand Dominance Right   Extremity/Trunk Assessment Upper Extremity Assessment Upper Extremity Assessment: RUE deficits/detail;LUE deficits/detail RUE Deficits / Details: AROM 100* shoulder flex, 3/5 strength  LUE Deficits / Details: 110* active shoulder flexion, 3/5 strength   Lower Extremity Assessment Lower Extremity Assessment: Defer to PT evaluation LLE Deficits / Details: 1-2/5 active movement, foot everted, pressure dressing on heel   Cervical / Trunk Assessment Cervical / Trunk Assessment: Kyphotic Cervical / Trunk Exceptions: R lateral curve to spine in static sitting eob reports pain in back    Communication Communication Communication: No difficulties   Cognition Arousal/Alertness: Awake/alert Behavior During Therapy: WFL for tasks assessed/performed Overall Cognitive Status: No family/caregiver present to determine baseline cognitive functioning Area of Impairment: Safety/judgement;Awareness;Problem solving                         Safety/Judgement: Decreased awareness of safety;Decreased awareness of deficits Awareness: Emergent Problem Solving: Difficulty sequencing;Requires verbal cues;Requires tactile cues General Comments: Pt with difficulty problem solving safety techniques, requiring verbal cues to maintain balance sitting EOB   General Comments  Pt received on RA, SpO2 87% after activity, cues for pursed lip breathing  with no change. Placed on 2 L O2 with increase to 91%. Pt reports decreased ankle control and eversion new. Plan to coordinate with nursing staff for trial of prevalon boot on L foot    Exercises Amputee Exercises Hip ABduction/ADduction: AAROM;Strengthening;Right;10 reps;Supine Hip Flexion/Marching: AAROM;Strengthening;Right;10 reps;Supine Knee Flexion: AROM;Strengthening;Right;10 reps;Supine Knee Extension: AROM;Strengthening;Right;10 reps;Supine Straight Leg Raises: AAROM;Strengthening;Right;10 reps;Supine   Shoulder Instructions      Home Living Family/patient expects to be discharged to:: Private residence Living Arrangements: Spouse/significant other Available Help at Discharge: Family;Available PRN/intermittently Type of Home: House Home Access: Level entry     Home Layout: One level               Home Equipment: Walker - 4 wheels   Additional Comments: Pt reports frequent falls at home      Prior Functioning/Environment Level of Independence: Needs assistance  Gait / Transfers Assistance Needed: Limited ambulation with rollator ADL's / Homemaking Assistance Needed: pt states "its been difficult"   Comments: Pt reports when infection got bad, she crawled around in house        OT Problem List: Decreased activity tolerance;Impaired balance (sitting and/or standing);Decreased cognition;Decreased knowledge of use of DME or AE;Decreased knowledge of precautions;Cardiopulmonary status limiting activity;Obesity;Pain;Impaired UE functional use;Decreased safety awareness;Decreased strength;Increased edema      OT Treatment/Interventions: Self-care/ADL training;Therapeutic exercise;Energy conservation;DME and/or AE instruction;Manual therapy;Modalities;Therapeutic activities;Cognitive remediation/compensation;Patient/family education;Balance training    OT Goals(Current goals can be found in the care plan section) Acute Rehab OT Goals Patient Stated Goal: to get  therapy OT  Goal Formulation: With patient Time For Goal Achievement: 10/16/20 Potential to Achieve Goals: Fair  OT Frequency: Min 2X/week   Barriers to D/C:            Co-evaluation PT/OT/SLP Co-Evaluation/Treatment: Yes Reason for Co-Treatment: Complexity of the patient's impairments (multi-system involvement);For patient/therapist safety;To address functional/ADL transfers PT goals addressed during session: Mobility/safety with mobility;Balance;Strengthening/ROM OT goals addressed during session: ADL's and self-care;Other (comment) (bed mobility, transfers)      AM-PAC OT "6 Clicks" Daily Activity     Outcome Measure Help from another person eating meals?: A Little Help from another person taking care of personal grooming?: A Little Help from another person toileting, which includes using toliet, bedpan, or urinal?: Total Help from another person bathing (including washing, rinsing, drying)?: Total Help from another person to put on and taking off regular upper body clothing?: A Little Help from another person to put on and taking off regular lower body clothing?: Total 6 Click Score: 12   End of Session Equipment Utilized During Treatment: Oxygen Nurse Communication: Mobility status;Need for lift equipment;Other (comment) (O2 )  Activity Tolerance: Patient limited by fatigue Patient left: in bed;with call bell/phone within reach;with bed alarm set;with SCD's reapplied  OT Visit Diagnosis: Unsteadiness on feet (R26.81);Pain;Muscle weakness (generalized) (M62.81);History of falling (Z91.81);Other abnormalities of gait and mobility (R26.89);Repeated falls (R29.6)                Time: 0973-5329 OT Time Calculation (min): 32 min Charges:  OT General Charges $OT Visit: 1 Visit OT Evaluation $OT Re-eval: 1 Re-eval  Layla Maw, OTR/L  Layla Maw 10/02/2020, 10:07 AM

## 2020-10-02 NOTE — Evaluation (Signed)
Physical Therapy Evaluation Patient Details Name: [name] MRN: [id number] DOB: [birth year] Today's Date: 10/02/2020   History of Present Illness  59 yo female admitted s/p fall and acute kidney injury. pt's R leg has cellulitis and abscesses and chronic diabetic foot infection. 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 Pt is significantly deconditioned and will need increased time for rehabilitation. Pt presents with 1-2/5 strength in L LE. Pt educated on proper positioning and began R BKA exercises. Pt was able to tolerate sitting EOB with therapist support. Pt will continue to benefit from acute skilled PT to maximize mobility for the next venue of care.    Follow Up Recommendations SNF    Equipment Recommendations  None recommended by PT    Recommendations for Other Services       Precautions / Restrictions Precautions Precautions: Fall;Other (comment) Precaution Comments: risk for skin break down, wound vac      Mobility  Bed Mobility Overal bed mobility: Needs Assistance Bed Mobility: Supine to Sit     Supine to sit: +2 for physical assistance;Max assist;HOB elevated Sit to supine: +2 for physical assistance;Total assist      Transfers                    Ambulation/Gait                Stairs            Wheelchair Mobility    Modified Rankin (Stroke Patients Only)       Balance Overall balance assessment: Needs assistance Sitting-balance support: Bilateral upper extremity supported;Feet supported Sitting balance-Leahy Scale: Poor Sitting balance - Comments: sat EOB with hands on a chair, sat working on neutral posture, pt kyphotic and sustained posture app 1-2 mins before sinking into a kyphotic posture, leans posteriorly. Pt max at times to maintain  posture and min guard with hand supported and foot on the floor Postural control: Posterior lean                                   Pertinent Vitals/Pain Pain Assessment: Faces Faces Pain Scale: Hurts a little bit Pain Location: R Leg Pain Descriptors / Indicators: Discomfort Pain Intervention(s): Limited activity within patient's tolerance;Monitored during session;Repositioned    Home Living Family/patient expects to be discharged to:: Skilled nursing facility Living Arrangements: Spouse/significant other               Additional Comments: pt reports multiple falls at home one being from commode    Prior Function Level of Independence: Needs assistance   Gait / Transfers Assistance Needed: Limited ambulation with rollator           Hand Dominance        Extremity/Trunk Assessment   Upper Extremity Assessment Upper Extremity Assessment: Defer to OT evaluation    Lower Extremity Assessment LLE Deficits / Details: 1-2/5 active movement, foot everted, pressure dressing on heel    Cervical / Trunk Assessment Cervical / Trunk Assessment: Kyphotic  Communication   Communication: No difficulties  Cognition Arousal/Alertness: Awake/alert Behavior During Therapy: WFL for tasks assessed/performed Overall Cognitive Status: Within Functional Limits for tasks assessed  General Comments      Exercises Amputee Exercises Hip ABduction/ADduction: AAROM;Strengthening;Right;10 reps;Supine Hip Flexion/Marching: AAROM;Strengthening;Right;10 reps;Supine Knee Flexion: AROM;Strengthening;Right;10 reps;Supine Knee Extension: AROM;Strengthening;Right;10 reps;Supine Straight Leg Raises: AAROM;Strengthening;Right;10 reps;Supine   Assessment/Plan    PT Assessment Patient needs continued PT services  PT Problem List Decreased strength;Decreased activity tolerance;Decreased balance;Decreased mobility;Decreased  coordination;Decreased knowledge of use of DME;Pain       PT Treatment Interventions DME instruction;Functional mobility training;Therapeutic activities;Therapeutic exercise;Patient/family education;Balance training;Wheelchair mobility training    PT Goals (Current goals can be found in the Care Plan section)  Acute Rehab PT Goals Patient Stated Goal: To get therapy PT Goal Formulation: With patient Time For Goal Achievement: 10/16/20 Potential to Achieve Goals: Fair    Frequency Min 3X/week   Barriers to discharge        Co-evaluation PT/OT/SLP Co-Evaluation/Treatment: Yes Reason for Co-Treatment: Complexity of the patient's impairments (multi-system involvement);For patient/therapist safety;To address functional/ADL transfers PT goals addressed during session: Mobility/safety with mobility;Balance;Strengthening/ROM OT goals addressed during session: ADL's and self-care;Other (comment) (transfers)       AM-PAC PT "6 Clicks" Mobility  Outcome Measure Help needed turning from your back to your side while in a flat bed without using bedrails?: A Lot Help needed moving from lying on your back to sitting on the side of a flat bed without using bedrails?: Total Help needed moving to and from a bed to a chair (including a wheelchair)?: Total Help needed standing up from a chair using your arms (e.g., wheelchair or bedside chair)?: Total Help needed to walk in hospital room?: Total Help needed climbing 3-5 steps with a railing? : Total 6 Click Score: 7    End of Session   Activity Tolerance: Patient tolerated treatment well Patient left: in bed;with call bell/phone within reach Nurse Communication: Mobility status PT Visit Diagnosis: Muscle weakness (generalized) (M62.81);History of falling (Z91.81);Difficulty in walking, not elsewhere classified (R26.2);Pain Pain - Right/Left: Right Pain - part of body: Leg    Time: 0822-0858 PT Time Calculation (min) (ACUTE ONLY): 36  min   Charges:   PT Evaluation $PT Re-evaluation: 1 Re-eval PT Treatments $Therapeutic Activity: 8-22 mins        Lelon Mast 10/02/2020, 9:18 AM

## 2020-10-02 NOTE — Progress Notes (Signed)
Patient ID: [name], female   DOB: 20-Apr-1961, 59 y.o.   MRN: [id number] Patient is postoperative day 1 right transtibial amputation.  Patient has a [name] plus wound VAC pump there is no drainage there is a good suction seal.  Anticipate discharge to skilled nursing.  Patient states that she is ready to order breakfast.

## 2020-10-03 LAB — CBC WITH DIFFERENTIAL/PLATELET
Abs Immature Granulocytes: 0.08 10*3/uL — ABNORMAL HIGH (ref 0.00–0.07)
Basophils Absolute: 0 10*3/uL (ref 0.0–0.1)
Basophils Relative: 1 %
Eosinophils Absolute: 0.1 10*3/uL (ref 0.0–0.5)
Eosinophils Relative: 2 %
HCT: 24.1 % — ABNORMAL LOW (ref 36.0–46.0)
Hemoglobin: 7.4 g/dL — ABNORMAL LOW (ref 12.0–15.0)
Immature Granulocytes: 1 %
Lymphocytes Relative: 11 %
Lymphs Abs: 0.8 10*3/uL (ref 0.7–4.0)
MCH: 35.1 pg — ABNORMAL HIGH (ref 26.0–34.0)
MCHC: 30.7 g/dL (ref 30.0–36.0)
MCV: 114.2 fL — ABNORMAL HIGH (ref 80.0–100.0)
Monocytes Absolute: 0.4 10*3/uL (ref 0.1–1.0)
Monocytes Relative: 5 %
Neutro Abs: 5.4 10*3/uL (ref 1.7–7.7)
Neutrophils Relative %: 80 %
Platelets: 295 10*3/uL (ref 150–400)
RBC: 2.11 MIL/uL — ABNORMAL LOW (ref 3.87–5.11)
RDW: 18.1 % — ABNORMAL HIGH (ref 11.5–15.5)
WBC: 6.8 10*3/uL (ref 4.0–10.5)
nRBC: 0 % (ref 0.0–0.2)

## 2020-10-03 LAB — COMPREHENSIVE METABOLIC PANEL
ALT: 12 U/L (ref 0–44)
AST: 23 U/L (ref 15–41)
Albumin: 1 g/dL — ABNORMAL LOW (ref 3.5–5.0)
Alkaline Phosphatase: 56 U/L (ref 38–126)
Anion gap: 10 (ref 5–15)
BUN: 68 mg/dL — ABNORMAL HIGH (ref 6–20)
CO2: 18 mmol/L — ABNORMAL LOW (ref 22–32)
Calcium: 9.7 mg/dL (ref 8.9–10.3)
Chloride: 108 mmol/L (ref 98–111)
Creatinine, Ser: 1.29 mg/dL — ABNORMAL HIGH (ref 0.44–1.00)
GFR calc non Af Amer: 45 mL/min — ABNORMAL LOW (ref >60)
Glucose, Bld: 153 mg/dL — ABNORMAL HIGH (ref 70–99)
Potassium: 5.6 mmol/L — ABNORMAL HIGH (ref 3.5–5.1)
Sodium: 136 mmol/L (ref 135–145)
Total Bilirubin: 0.7 mg/dL (ref 0.3–1.2)
Total Protein: 4.2 g/dL — ABNORMAL LOW (ref 6.5–8.1)

## 2020-10-03 LAB — GLUCOSE, CAPILLARY
Glucose-Capillary: 129 mg/dL — ABNORMAL HIGH (ref 70–99)
Glucose-Capillary: 115 mg/dL — ABNORMAL HIGH (ref 70–99)
Glucose-Capillary: 106 mg/dL — ABNORMAL HIGH (ref 70–99)
Glucose-Capillary: 117 mg/dL — ABNORMAL HIGH (ref 70–99)

## 2020-10-03 LAB — SURGICAL PATHOLOGY

## 2020-10-03 MED ORDER — SODIUM BICARBONATE 650 MG PO TABS
650.0000 mg | ORAL_TABLET | Freq: Two times a day (BID) | ORAL | Status: DC
  Administered 2020-10-03 – 2020-10-06 (×7): 650 mg via ORAL
  Filled 2020-10-03 (×7): qty 1

## 2020-10-03 NOTE — Plan of Care (Signed)
  Problem: Health Behavior/Discharge Planning: Goal: Ability to manage health-related needs will improve Outcome: Progressing   Problem: Clinical Measurements: Goal: Cardiovascular complication will be avoided Outcome: Progressing   Problem: Activity: Goal: Risk for activity intolerance will decrease Outcome: Progressing   

## 2020-10-03 NOTE — Progress Notes (Signed)
POD 2 Right transtibial Amputation  Alert comfortable and awake.    Prevena vac in place. Functioning well with 5 cc drainage.   Plan : Can discharge from an ortho standpoint to SNF. Follow up 1 week in office

## 2020-10-03 NOTE — Progress Notes (Addendum)
PROGRESS NOTE    [name]  ZDG:[id number] DOB: Jan 11, 1961 DOA: 09/26/2020 PCP: [name], MD  Brief Narrative:  59 year old female with chronic DM TY 1 with insulin pump, chronic kidney disease stage IIIa Pancreatic/renal transplant on immunosuppressants with prednisone Left heel ulcers in the past followed by wound care [facility]-but has a worsening purulent ulcer on the right dorsum of the foot came to the emergency room feeling weak uncomfortable White count was 15.5 hemoglobin 7.7 BUNs/creatinine 106/3.6 (baseline 1.33) blood glucose 734  Patient found to have DKA started on insulin drip and eventually transition to usual medications  Patient underwent transtibial amputation right foot Dr. 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 DKA on admission now resolved in the setting of TY 1 DM a. DKA physiology resolved soon after admission b. Supposed to be on insulin pump but change to sliding scale c. CBG 129-153 and better controlled today d. Continue higher dose of Levemir 20 units 2. Purulent cellulitis with diabetic wound on right foot, superficial culture Pseudomonas/Serratia a. Status post amputation right side 10/6 b. Antibiotics discontinued 10/7 c. Cleaning L sided Heel-sided wounds with normal saline Xeroform gauze and helping with silicone foam and Prevalon boots-can be changed  daily d. Will need outpatient attention to the same 3. Acute kidney injury superimposed on CKD 3 4. Hyperkalemia as well as metabolic acidosis 5. admission creatinine was 3.5 this was likely prerenal improved with hydration a. Held off Zestril 5 b. Renal US without hydronephrosis c. Hyperkalemia Rx 10/02/2020 Lokelma x1-potassium slightly better d. Kidney function remains about the same and this may be her new baseline e. Start 10/03/2020 bicarb 650 twice daily 6. Kidney 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 a. Continue azathioprine 50 daily, cyclosporine 75 twice daily and prednisone 5 mg daily b. Outpatient follow-up with specialists and transplant team in [facility] 7. HTN a. Continue metoprolol 12.5 daily-lisinopril discontinued 8. Acute on chronic anemia a. Patient has macrocytic anemia however iron stores are low with iron 11 TSAT 10 therefore we will give IV iron  b. Will need repeat check on labs next 3 weeks  DVT prophylaxis: None present Code Status: Full code Family Communication: called [name] husband [phone] -but no answer on multiple numbers today, Left VM Disposition:   Status is: Inpatient  Remains inpatient appropriate because:Persistent severe electrolyte disturbances, Ongoing active pain requiring inpatient pain management, Ongoing diagnostic testing needed not appropriate for outpatient work up and Inpatient level of care appropriate due to severity of illness   Dispo:  Patient From: Home  Planned Disposition: [place]  Expected discharge date: 10/06/20  Medically stable for discharge: No    Consultants:   Orthopedics Dr. Sharol Given  Procedures: Transtibial amputation performed 10/01/2020  Antimicrobials: As above   Subjective: Awake alert coherent in some amount of pain may be 6-7 out of 10 but otherwise doing well No fever no  chills Passing reasonable amounts of urine has chronic Foley which has to be kept in No  nausea no vomiting   Objective: Vitals:   10/03/20 0001 10/03/20 0336 10/03/20 0500 10/03/20 0800  BP: (!) 114/59 119/65  132/70  Pulse: 70 70  69  Resp:  17  17  Temp: (!) 97.3 F (36.3 C) (!) 97.4 F (36.3 C)  98 F (36.7 C)  TempSrc: Oral Oral  Oral  SpO2:  96%  97%  Weight:   56.3 kg   Height:        Intake/Output Summary (Last 24 hours) at 10/03/2020 1058 Last data filed at 10/03/2020 0843 Gross per 24 hour  Intake 918.55 ml  Output 450 ml  Net 468.55 ml   Filed Weights   09/29/20 0500 10/01/20 1403 10/03/20 0500  Weight: 49.1 kg 49.1 kg 56.3 kg    Examination:  Respiratory system: Clear no rales rhonchi or added sound Cardiovascular system: I5-O2 holosystolic murmur across precordium Gastrointestinal system: Soft nontender Central nervous system: Neurologically intact moving all 3 limbs--RLE limited buecent amputation and wound vac Extremities: No joint contractures, BKA noted on right side with wound VAC applied        Data Reviewed: I have personally reviewed following labs and imaging studies Potassium 5.6--> 6.2-->5.6 BUNs/creatinine down to 60/1.2 ----> 61/1.3-->68/1.29 CO2 20-->19-->18 Iron level 11 saturation ratios 10 Hemoglobin 7.9-->7.4  WBC 6.8 platelet 295  Radiology Studies: No results found.   Scheduled Meds: . atorvastatin  10 mg Oral Daily  . azaTHIOprine  50 mg Oral Daily  . Chlorhexidine Gluconate Cloth  6 each Topical Daily  . Chlorhexidine Gluconate Cloth  6 each Topical Q0600  . cycloSPORINE  75 mg Oral BID  . docusate sodium  100 mg Oral BID  . heparin injection (subcutaneous)  5,000 Units Subcutaneous Q8H  . insulin aspart  0-15 Units Subcutaneous TID WC  . insulin aspart  0-5 Units Subcutaneous QHS  . insulin aspart  2 Units Subcutaneous TID WC  . insulin detemir  20 Units Subcutaneous Daily  . levothyroxine  50 mcg Oral QAC breakfast  . metoprolol tartrate  12.5 mg Oral Daily  . multivitamin with minerals  1 tablet  Oral Daily  . mupirocin ointment  1 application Nasal BID  . nutrition supplement (JUVEN)  1 packet Oral BID BM  . predniSONE  5 mg Oral Q breakfast  . Ensure Max Protein  11 oz Oral QHS  . sertraline  25 mg Oral Daily  . sodium bicarbonate  650 mg Oral BID  . sodium zirconium cyclosilicate  10 g Oral BID   Continuous Infusions: . sodium chloride 75 mL/hr at 10/02/20 1500  . lactated ringers Stopped (10/01/20 1607)     LOS: 7 days    Time spent: [place], MD [facility] To contact the attending provider between 7A-7P or the covering provider during after hours 7P-7A, please log into the web site [email or web address] and access using universal [place] password for that web site. If you do not have the password, please call the hospital operator.  10/03/2020, 10:58 AM

## 2020-10-04 LAB — GLUCOSE, CAPILLARY
Glucose-Capillary: 122 mg/dL — ABNORMAL HIGH (ref 70–99)
Glucose-Capillary: 124 mg/dL — ABNORMAL HIGH (ref 70–99)
Glucose-Capillary: 81 mg/dL (ref 70–99)
Glucose-Capillary: 62 mg/dL — ABNORMAL LOW (ref 70–99)
Glucose-Capillary: 115 mg/dL — ABNORMAL HIGH (ref 70–99)

## 2020-10-04 LAB — COMPREHENSIVE METABOLIC PANEL
ALT: 13 U/L (ref 0–44)
AST: 28 U/L (ref 15–41)
Albumin: 1.1 g/dL — ABNORMAL LOW (ref 3.5–5.0)
Alkaline Phosphatase: 63 U/L (ref 38–126)
Anion gap: 10 (ref 5–15)
BUN: 63 mg/dL — ABNORMAL HIGH (ref 6–20)
CO2: 19 mmol/L — ABNORMAL LOW (ref 22–32)
Calcium: 10.1 mg/dL (ref 8.9–10.3)
Chloride: 111 mmol/L (ref 98–111)
Creatinine, Ser: 1.23 mg/dL — ABNORMAL HIGH (ref 0.44–1.00)
GFR, Estimated: 48 mL/min — ABNORMAL LOW (ref >60)
Glucose, Bld: 118 mg/dL — ABNORMAL HIGH (ref 70–99)
Potassium: 4.8 mmol/L (ref 3.5–5.1)
Sodium: 140 mmol/L (ref 135–145)
Total Bilirubin: 0.6 mg/dL (ref 0.3–1.2)
Total Protein: 4.5 g/dL — ABNORMAL LOW (ref 6.5–8.1)

## 2020-10-04 LAB — CBC WITH DIFFERENTIAL/PLATELET
Abs Immature Granulocytes: 0.05 10*3/uL (ref 0.00–0.07)
Basophils Absolute: 0 10*3/uL (ref 0.0–0.1)
Basophils Relative: 1 %
Eosinophils Absolute: 0.1 10*3/uL (ref 0.0–0.5)
Eosinophils Relative: 2 %
HCT: 25.7 % — ABNORMAL LOW (ref 36.0–46.0)
Hemoglobin: 7.7 g/dL — ABNORMAL LOW (ref 12.0–15.0)
Immature Granulocytes: 1 %
Lymphocytes Relative: 14 %
Lymphs Abs: 0.8 10*3/uL (ref 0.7–4.0)
MCH: 33.8 pg (ref 26.0–34.0)
MCHC: 30 g/dL (ref 30.0–36.0)
MCV: 112.7 fL — ABNORMAL HIGH (ref 80.0–100.0)
Monocytes Absolute: 0.3 10*3/uL (ref 0.1–1.0)
Monocytes Relative: 5 %
Neutro Abs: 4.3 10*3/uL (ref 1.7–7.7)
Neutrophils Relative %: 77 %
Platelets: 307 10*3/uL (ref 150–400)
RBC: 2.28 MIL/uL — ABNORMAL LOW (ref 3.87–5.11)
RDW: 17.9 % — ABNORMAL HIGH (ref 11.5–15.5)
WBC: 5.6 10*3/uL (ref 4.0–10.5)
nRBC: 0 % (ref 0.0–0.2)

## 2020-10-04 MED ORDER — MORPHINE SULFATE (PF) 2 MG/ML IV SOLN: 1.0000 mg | INTRAVENOUS | Status: DC | PRN

## 2020-10-04 MED ORDER — HYDROCODONE-ACETAMINOPHEN 5-325 MG PO TABS
1.0000 | ORAL_TABLET | ORAL | Status: DC | PRN
  Administered 2020-10-04 – 2020-10-06 (×5): 1 via ORAL
  Filled 2020-10-04 (×5): qty 1

## 2020-10-04 NOTE — Plan of Care (Signed)

## 2020-10-04 NOTE — TOC Progression Note (Signed)
Transition of Care [facility]) - Progression Note    Patient Details  Name: [name] MRN: [id number] Date of Birth: [birth year]  Transition of Care [facility] CM/SW [place], [place] Phone Number: 10/04/2020, 12:58 PM  Clinical Narrative:     CSW presented pt with bed offers. Pt chose [facility]. CSW reached out to [facility] and they have beds available. Pt has bcbs and the snf will need to start auth.    Expected Discharge Plan: [place] Barriers to Discharge: Continued Medical Work up  Expected Discharge Plan and Services Expected Discharge Plan: [place] arrangements for the past 2 months: Apartment                                       Social Determinants of Health (SDOH) Interventions    Readmission Risk Interventions No flowsheet data found.  Emeterio Reeve, Latanya Presser, [place] Social Worker [phone]

## 2020-10-04 NOTE — Progress Notes (Signed)
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 Sharol Given 10/6  Assessment & Plan:   Principal Problem:   Diabetic ketoacidosis ([place]) Active Problems:   Anemia secondary to renal failure   History of simultaneous kidney and pancreas transplant ([place])   Chronic kidney disease (CKD) stage G3a/A1, moderately decreased glomerular filtration rate (GFR) between 45-59 mL/min/1.73 square meter and albuminuria creatinine ratio less than 30 mg/g (HCC)   Hypertension   Acute kidney injury superimposed on CKD (HCC)   Cellulitis and abscess of right leg   DKA, type 1 ([place])   Pressure injury of skin   Gangrene of right foot ([place])   Subacute osteomyelitis of right foot ([place])   1. DKA on admission now resolved in the setting of TY 1 DM a. DKA physiology resolved soon after admission b. Supposed to be on insulin pump but change to sliding scale c. CBG well controlled between 101 30 d. Continue higher dose of Levemir 20 units in addition to 2 units of regular insulin 3 times a day with meals and moderate sliding scale 2. Purulent cellulitis with diabetic wound on right foot, superficial culture Pseudomonas/Serratia a. Status post amputation right side 10/6 b. Antibiotics discontinued 10/7 c. Cleaning L sided Heel-sided wounds with  normal saline Xeroform gauze and helping with silicone foam and Prevalon boots-can be changed daily 3. Acute kidney injury superimposed on CKD 3 4. Hyperkalemia as well as metabolic acidosis 5. admission creatinine was 3.5 this was likely prerenal improved with hydration a. Held off Zestril 5 b. Renal US without hydronephrosis c. Hyperkalemia Rx 10/02/2020 Lokelma x1-potassium slightly better d. Kidney function remains about the same and this may be her new baseline-patient is off of IV fluids at this time e. Start 10/03/2020 bicarb 650 twice daily 6. Kidney 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 a. Continue azathioprine 50 daily, cyclosporine 75 twice daily and prednisone 5 mg daily b. Outpatient follow-up with specialists and transplant team in [facility] 7. HTN a. Continue metoprolol 12.5 daily-lisinopril discontinued 8. Acute on chronic anemia a. Patient has macrocytic anemia however iron stores are low with iron 11 TSAT 10 therefore we will give IV iron  b. Will need repeat check on labs next 3 weeks  DVT prophylaxis: None present Code Status: Full code Family Communication: called [name] husband [phone] -but no answer on multiple numbers 10/8 and left voicemail at that time Disposition:   Status is: Inpatient  Remains inpatient appropriate because:Persistent severe electrolyte disturbances, Ongoing active pain requiring inpatient pain management, Ongoing diagnostic testing needed not appropriate for outpatient work up and Inpatient level of care appropriate due to severity of illness   Dispo:  Patient From: Home  Planned Disposition: [place]  Expected discharge date: 10/06/20  Medically stable for discharge: No    Consultants:   Orthopedics Dr. Sharol Given  Procedures: Transtibial amputation performed 10/01/2020  Antimicrobials: As above  Subjective: Pain seems moderately controlled Patient seems to be in good spirits with no  distress No chest pain no fever She is eating fairly well She voices no complaints   Objective: Vitals:   10/03/20 2045 10/04/20 0346 10/04/20 0500 10/04/20 0736  BP: 125/69 136/69  128/61  Pulse: 73 74  75  Resp: 17 16  16   Temp: 97.9 F (36.6 C) 97.7 F (36.5 C)  98 F (36.7 C)  TempSrc: Oral Oral  Oral  SpO2: 97% 93%  92%  Weight:   57.6 kg   Height:        Intake/Output Summary (Last 24 hours) at 10/04/2020 1332 Last data filed at 10/04/2020 0854 Gross per 24 hour  Intake 480 ml  Output 500 ml  Net -20 ml   Filed Weights   10/01/20 1403 10/03/20 0500 10/04/20 0500  Weight: 49.1 kg 56.3 kg 57.6 kg    Examination:  Respiratory system: Clear no added sounds rales rhonchi Cardiovascular system: A4-Z6 holosystolic murmur across precordium Gastrointestinal system: Soft nontender Central nervous system: Neurologically intact moving all 3 limbs--RLE limited buecent amputation and wound vac Extremities: See pictures from wound exam on 10/8 regarding left lower extremity wounds     Data Reviewed: I have personally reviewed following labs and imaging studies Potassium 5.6--> 6.2-->5.6-->4.8 BUNs/creatinine down to 60/1.2 ----> 61/1.3-->68/1.29-->63/1.2 CO2 20-->19-->18-->19 Hemoglobin 7.9-->7.4--7.7  WBC 5.6   Radiology Studies: No results found.   Scheduled Meds: . atorvastatin  10 mg Oral Daily  . azaTHIOprine  50 mg Oral Daily  . Chlorhexidine Gluconate Cloth  6 each Topical Daily  . Chlorhexidine Gluconate Cloth  6 each Topical Q0600  . cycloSPORINE  75 mg Oral BID  . docusate sodium  100 mg Oral BID  . heparin injection (subcutaneous)  5,000 Units Subcutaneous Q8H  . insulin aspart  0-15 Units Subcutaneous TID WC  . insulin aspart  0-5 Units Subcutaneous QHS  . insulin aspart  2 Units Subcutaneous TID WC  . insulin detemir  20 Units Subcutaneous Daily  . levothyroxine  50 mcg Oral QAC breakfast  . metoprolol tartrate  12.5 mg Oral Daily  .  multivitamin with minerals  1 tablet Oral Daily  . mupirocin ointment  1 application Nasal BID  . nutrition supplement (JUVEN)  1 packet Oral BID BM  . predniSONE  5 mg Oral Q breakfast  . Ensure Max Protein  11 oz Oral QHS  . sertraline  25 mg Oral Daily  . sodium bicarbonate  650 mg Oral BID  . sodium zirconium cyclosilicate  10 g Oral BID   Continuous Infusions: . lactated ringers Stopped (10/01/20 1607)     LOS: 8 days    Time spent: [place], MD [facility] To contact the attending provider between 7A-7P or the covering provider during after hours 7P-7A, please log into the web site [email or web address] and access using universal [place] password for that web site. If you do not have the password, please call the hospital operator.  10/04/2020, 1:32 PM

## 2020-10-05 ENCOUNTER — Encounter (HOSPITAL_COMMUNITY): Payer: Self-pay | Admitting: Internal Medicine

## 2020-10-05 LAB — CBC WITH DIFFERENTIAL/PLATELET
Abs Immature Granulocytes: 0.06 10*3/uL (ref 0.00–0.07)
Basophils Absolute: 0 10*3/uL (ref 0.0–0.1)
Basophils Relative: 1 %
Eosinophils Absolute: 0.1 10*3/uL (ref 0.0–0.5)
Eosinophils Relative: 2 %
HCT: 25.1 % — ABNORMAL LOW (ref 36.0–46.0)
Hemoglobin: 7.5 g/dL — ABNORMAL LOW (ref 12.0–15.0)
Immature Granulocytes: 1 %
Lymphocytes Relative: 14 %
Lymphs Abs: 0.9 10*3/uL (ref 0.7–4.0)
MCH: 33.5 pg (ref 26.0–34.0)
MCHC: 29.9 g/dL — ABNORMAL LOW (ref 30.0–36.0)
MCV: 112.1 fL — ABNORMAL HIGH (ref 80.0–100.0)
Monocytes Absolute: 0.3 10*3/uL (ref 0.1–1.0)
Monocytes Relative: 5 %
Neutro Abs: 4.9 10*3/uL (ref 1.7–7.7)
Neutrophils Relative %: 77 %
Platelets: 319 10*3/uL (ref 150–400)
RBC: 2.24 MIL/uL — ABNORMAL LOW (ref 3.87–5.11)
RDW: 17.6 % — ABNORMAL HIGH (ref 11.5–15.5)
WBC: 6.2 10*3/uL (ref 4.0–10.5)
nRBC: 0.3 % — ABNORMAL HIGH (ref 0.0–0.2)

## 2020-10-05 LAB — COMPREHENSIVE METABOLIC PANEL
ALT: 14 U/L (ref 0–44)
AST: 28 U/L (ref 15–41)
Albumin: 1.1 g/dL — ABNORMAL LOW (ref 3.5–5.0)
Alkaline Phosphatase: 54 U/L (ref 38–126)
Anion gap: 7 (ref 5–15)
BUN: 69 mg/dL — ABNORMAL HIGH (ref 6–20)
CO2: 23 mmol/L (ref 22–32)
Calcium: 10.1 mg/dL (ref 8.9–10.3)
Chloride: 109 mmol/L (ref 98–111)
Creatinine, Ser: 1.11 mg/dL — ABNORMAL HIGH (ref 0.44–1.00)
GFR, Estimated: 54 mL/min — ABNORMAL LOW (ref >60)
Glucose, Bld: 114 mg/dL — ABNORMAL HIGH (ref 70–99)
Potassium: 4.8 mmol/L (ref 3.5–5.1)
Sodium: 139 mmol/L (ref 135–145)
Total Bilirubin: 0.4 mg/dL (ref 0.3–1.2)
Total Protein: 4.3 g/dL — ABNORMAL LOW (ref 6.5–8.1)

## 2020-10-05 LAB — GLUCOSE, CAPILLARY
Glucose-Capillary: 252 mg/dL — ABNORMAL HIGH (ref 70–99)
Glucose-Capillary: 324 mg/dL — ABNORMAL HIGH (ref 70–99)
Glucose-Capillary: 256 mg/dL — ABNORMAL HIGH (ref 70–99)
Glucose-Capillary: 143 mg/dL — ABNORMAL HIGH (ref 70–99)

## 2020-10-05 LAB — SARS CORONAVIRUS 2 BY RT PCR (HOSPITAL ORDER, PERFORMED IN ~~LOC~~ HOSPITAL LAB): SARS Coronavirus 2: NEGATIVE

## 2020-10-05 MED ORDER — INSULIN DETEMIR 100 UNIT/ML ~~LOC~~ SOLN
24.0000 [IU] | Freq: Every day | SUBCUTANEOUS | Status: DC
  Administered 2020-10-06: 24 [IU] via SUBCUTANEOUS
  Filled 2020-10-05: qty 0.24

## 2020-10-05 NOTE — Progress Notes (Signed)
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 Sharol Given 10/6  Assessment & Plan:   Principal Problem:   Diabetic ketoacidosis ([name]) Active Problems:   Anemia secondary to renal failure   History of simultaneous kidney and pancreas transplant ([name])   Chronic kidney disease (CKD) stage G3a/A1, moderately decreased glomerular filtration rate (GFR) between 45-59 mL/min/1.73 square meter and albuminuria creatinine ratio less than 30 mg/g (HCC)   Hypertension   Acute kidney injury superimposed on CKD (HCC)   Cellulitis and abscess of right leg   DKA, type 1 ([name])   Pressure injury of skin   Gangrene of right foot ([name])   Subacute osteomyelitis of right foot ([name])   1. DKA on admission now resolved in the setting of TY 1 DM a. prior on insulin pump but change to sliding scale b. CBG's 252-324  c. incr Levemir 20 -->24 units in addition to 2 units of regular insulin 3 times a day with meals and moderate sliding scale 2. Purulent cellulitis with diabetic wound on right foot, superficial culture Pseudomonas/Serratia a. Status post amputation right side 10/6 b. Antibiotics discontinued 10/7 c. Cleaning L sided Heel-sided wounds with normal saline Xeroform gauze and helping with silicone foam and Prevalon boots-can be changed  daily 3. Acute kidney injury superimposed on CKD 3 4. Hyperkalemia as well as metabolic acidosis 5. admission creatinine was 3.5 this was likely prerenal improved with hydration a. Held off Zestril 5 b. Renal US without hydronephrosis c. Hyperkalemia Rx 10/02/2020 Lokelma x1-potassium slightly better d. Kidney function same-maybe her new baseline e. Start 10/03/2020 bicarb 650 twice daily 6. Kidney 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 a. Continue azathioprine 50 daily, cyclosporine 75 twice daily and prednisone 5 mg daily b. Outpatient follow-up with specialists and transplant team in [facility] 7. HTN a. Continue metoprolol 12.5 daily-lisinopril discontinued 8. Acute on chronic anemia a. Patient has macrocytic anemia however iron stores are low with iron 11 TSAT 10 therefore we will give IV iron  b. Will need repeat check on labs next 3 weeks  DVT prophylaxis: None present Code Status: Full code Family Communication: called [name] husband [phone] -but no answer on multiple numbers 10/8  Disposition:   Status is: Inpatient  Remains inpatient appropriate because:Persistent severe electrolyte disturbances, Ongoing active pain requiring inpatient pain management, Ongoing diagnostic testing needed not appropriate for outpatient work up and Inpatient level of care appropriate due to severity of illness   Dispo:  Patient From: Home  Planned Disposition: [place]  Expected discharge date: 10/06/20  Medically stable for discharge: No    Consultants:   Orthopedics Dr. Sharol Given  Procedures: Transtibial amputation performed 10/01/2020  Antimicrobials: As above   Subjective:  loosk well feels fair no new issues Seems to be eating some-doesn't seem verys wollen no cp No n/v   Objective: Vitals:   10/04/20 1537 10/04/20 2027 10/05/20 0500  10/05/20 0725  BP: 118/63 130/65 134/65 131/62  Pulse: 81 72 80 71  Resp: 16 18 16 18   Temp: 98.2 F  (36.8 C) 98 F (36.7 C) 98.5 F (36.9 C) 98.1 F (36.7 C)  TempSrc: Oral Oral Oral Oral  SpO2: 96% 95% 93% 94%  Weight:   57 kg   Height:        Intake/Output Summary (Last 24 hours) at 10/05/2020 1252 Last data filed at 10/05/2020 1201 Gross per 24 hour  Intake 1120 ml  Output 1100 ml  Net 20 ml   Filed Weights   10/03/20 0500 10/04/20 0500 10/05/20 0500  Weight: 56.3 kg 57.6 kg 57 kg    Examination:  Respiratory system: [place] rales rhonchi Cardiovascular system: U3-A4 holosystolic murmur across precordium Gastrointestinal system: Soft nontender no rebound no gaurd Central nervous system: Neurologically intact moving all 3 limbs--RLE limited with recent amputation and wound vac Extremities: See pictures from wound exam on 10/8 regarding left lower extremity wounds  Data Reviewed: I have personally reviewed following labs and imaging studies Potassium 5.6--> 6.2-->5.6-->4.8 BUNs/creatinine down to 60/1.2 ----> 61/1.3-->68/1.29-->63/1.2->69/1.1 CO2 20-->19-->18-->19-->23 Hemoglobin 7.9-->7.4--7.5  WBC 6.2  Radiology Studies: No results found.   Scheduled Meds: . atorvastatin  10 mg Oral Daily  . azaTHIOprine  50 mg Oral Daily  . Chlorhexidine Gluconate Cloth  6 each Topical Daily  . Chlorhexidine Gluconate Cloth  6 each Topical Q0600  . cycloSPORINE  75 mg Oral BID  . docusate sodium  100 mg Oral BID  . heparin injection (subcutaneous)  5,000 Units Subcutaneous Q8H  . insulin aspart  0-15 Units Subcutaneous TID WC  . insulin aspart  0-5 Units Subcutaneous QHS  . insulin aspart  2 Units Subcutaneous TID WC  . insulin detemir  20 Units Subcutaneous Daily  . levothyroxine  50 mcg Oral QAC breakfast  . metoprolol tartrate  12.5 mg Oral Daily  . multivitamin with minerals  1 tablet Oral Daily  . nutrition supplement (JUVEN)  1 packet Oral BID BM  . predniSONE  5 mg Oral Q breakfast  . Ensure Max Protein  11 oz Oral QHS  . sertraline  25 mg Oral Daily  . sodium  bicarbonate  650 mg Oral BID  . sodium zirconium cyclosilicate  10 g Oral BID   Continuous Infusions: . lactated ringers Stopped (10/01/20 1607)     LOS: 9 days    Time spent: [place], MD [facility] To contact the attending provider between 7A-7P or the covering provider during after hours 7P-7A, please log into the web site [email or web address] and access using universal [place] password for that web site. If you do not have the password, please call the hospital operator.  10/05/2020, 12:52 PM

## 2020-10-05 NOTE — Consult Note (Addendum)
WOC Nurse Consult Note: Reason for Consult: Deep Tissue Pressure Injury (DTPI) to coccyx Wound type: Pressure Pressure Injury POA: No Measurement: 5cm x 3.2cm area of purple/maroon pressure injury with epidermis just beginning to slough. Note:  DTPI in evolution. This is expected to evolve into a full thickness tissue loss. Wound bed: As described above. Drainage (amount, consistency, odor) scant serous Periwound: intact Dressing procedure/placement/frequency: I will provide the patient with a mattress replacement with low air loss feature and the staff with orders for topical care using a xeroform gauze as a wound contact layer and a silicone foam as a topper dressing.  Turning from side to side and minimizing time in the supine position is recommended and this is conveyed to the patient and her visitor.  Only DermaTherapy bed linens are to be used beneath patient, no disposables.  Some erythema with satellite lesions at the peri[phery are noted in the perineal/perianal area and I today discuss with Dr. Verlon Au via [place] that if he is in agreement, a few doses of systemic antifungal would expedite resolution.  The assistance of her Bedside RN is appreciated during my assessment and the development of the POC.  [facility] nursing team will follow, seeing every 7-10 days and will remain available to this patient, the nursing and medical teams.  Please re-consult if needed in between visits. Thanks, Maudie Flakes, MSN, RN, [place], Arther Abbott  Pager# [phone]

## 2020-10-06 DIAGNOSIS — E1010 Type 1 diabetes mellitus with ketoacidosis without coma: Secondary | ICD-10-CM | POA: Diagnosis not present

## 2020-10-06 DIAGNOSIS — E1021 Type 1 diabetes mellitus with diabetic nephropathy: Secondary | ICD-10-CM | POA: Diagnosis not present

## 2020-10-06 DIAGNOSIS — E1022 Type 1 diabetes mellitus with diabetic chronic kidney disease: Secondary | ICD-10-CM | POA: Diagnosis not present

## 2020-10-06 DIAGNOSIS — Z9483 Pancreas transplant status: Secondary | ICD-10-CM | POA: Diagnosis not present

## 2020-10-06 DIAGNOSIS — R531 Weakness: Secondary | ICD-10-CM | POA: Diagnosis not present

## 2020-10-06 DIAGNOSIS — M2550 Pain in unspecified joint: Secondary | ICD-10-CM | POA: Diagnosis not present

## 2020-10-06 DIAGNOSIS — N289 Disorder of kidney and ureter, unspecified: Secondary | ICD-10-CM | POA: Diagnosis not present

## 2020-10-06 DIAGNOSIS — K2100 Gastro-esophageal reflux disease with esophagitis, without bleeding: Secondary | ICD-10-CM | POA: Diagnosis not present

## 2020-10-06 DIAGNOSIS — I96 Gangrene, not elsewhere classified: Secondary | ICD-10-CM | POA: Diagnosis not present

## 2020-10-06 DIAGNOSIS — D631 Anemia in chronic kidney disease: Secondary | ICD-10-CM | POA: Diagnosis not present

## 2020-10-06 DIAGNOSIS — Z89511 Acquired absence of right leg below knee: Secondary | ICD-10-CM | POA: Diagnosis not present

## 2020-10-06 DIAGNOSIS — L89153 Pressure ulcer of sacral region, stage 3: Secondary | ICD-10-CM | POA: Diagnosis not present

## 2020-10-06 DIAGNOSIS — N189 Chronic kidney disease, unspecified: Secondary | ICD-10-CM | POA: Diagnosis not present

## 2020-10-06 DIAGNOSIS — Z794 Long term (current) use of insulin: Secondary | ICD-10-CM | POA: Diagnosis not present

## 2020-10-06 DIAGNOSIS — Z7401 Bed confinement status: Secondary | ICD-10-CM | POA: Diagnosis not present

## 2020-10-06 DIAGNOSIS — I129 Hypertensive chronic kidney disease with stage 1 through stage 4 chronic kidney disease, or unspecified chronic kidney disease: Secondary | ICD-10-CM | POA: Diagnosis not present

## 2020-10-06 DIAGNOSIS — R601 Generalized edema: Secondary | ICD-10-CM | POA: Diagnosis not present

## 2020-10-06 DIAGNOSIS — N179 Acute kidney failure, unspecified: Secondary | ICD-10-CM | POA: Diagnosis not present

## 2020-10-06 DIAGNOSIS — X58XXXA Exposure to other specified factors, initial encounter: Secondary | ICD-10-CM | POA: Diagnosis present

## 2020-10-06 DIAGNOSIS — E1165 Type 2 diabetes mellitus with hyperglycemia: Secondary | ICD-10-CM | POA: Diagnosis not present

## 2020-10-06 DIAGNOSIS — Z940 Kidney transplant status: Secondary | ICD-10-CM | POA: Diagnosis not present

## 2020-10-06 DIAGNOSIS — E872 Acidosis: Secondary | ICD-10-CM | POA: Diagnosis not present

## 2020-10-06 DIAGNOSIS — M6281 Muscle weakness (generalized): Secondary | ICD-10-CM | POA: Diagnosis not present

## 2020-10-06 DIAGNOSIS — T8619 Other complication of kidney transplant: Secondary | ICD-10-CM | POA: Diagnosis not present

## 2020-10-06 DIAGNOSIS — K2211 Ulcer of esophagus with bleeding: Secondary | ICD-10-CM | POA: Diagnosis not present

## 2020-10-06 DIAGNOSIS — L89620 Pressure ulcer of left heel, unstageable: Secondary | ICD-10-CM | POA: Diagnosis not present

## 2020-10-06 DIAGNOSIS — Z8249 Family history of ischemic heart disease and other diseases of the circulatory system: Secondary | ICD-10-CM | POA: Diagnosis not present

## 2020-10-06 DIAGNOSIS — Y830 Surgical operation with transplant of whole organ as the cause of abnormal reaction of the patient, or of later complication, without mention of misadventure at the time of the procedure: Secondary | ICD-10-CM | POA: Diagnosis present

## 2020-10-06 DIAGNOSIS — E8809 Other disorders of plasma-protein metabolism, not elsewhere classified: Secondary | ICD-10-CM | POA: Diagnosis present

## 2020-10-06 DIAGNOSIS — N1330 Unspecified hydronephrosis: Secondary | ICD-10-CM | POA: Diagnosis not present

## 2020-10-06 DIAGNOSIS — E875 Hyperkalemia: Secondary | ICD-10-CM | POA: Diagnosis not present

## 2020-10-06 DIAGNOSIS — E869 Volume depletion, unspecified: Secondary | ICD-10-CM | POA: Diagnosis present

## 2020-10-06 DIAGNOSIS — E039 Hypothyroidism, unspecified: Secondary | ICD-10-CM | POA: Diagnosis not present

## 2020-10-06 DIAGNOSIS — L89150 Pressure ulcer of sacral region, unstageable: Secondary | ICD-10-CM | POA: Diagnosis not present

## 2020-10-06 DIAGNOSIS — I10 Essential (primary) hypertension: Secondary | ICD-10-CM | POA: Diagnosis not present

## 2020-10-06 DIAGNOSIS — R609 Edema, unspecified: Secondary | ICD-10-CM | POA: Diagnosis not present

## 2020-10-06 DIAGNOSIS — D649 Anemia, unspecified: Secondary | ICD-10-CM | POA: Diagnosis not present

## 2020-10-06 DIAGNOSIS — F32A Depression, unspecified: Secondary | ICD-10-CM | POA: Diagnosis not present

## 2020-10-06 DIAGNOSIS — E1110 Type 2 diabetes mellitus with ketoacidosis without coma: Secondary | ICD-10-CM | POA: Diagnosis not present

## 2020-10-06 DIAGNOSIS — Z79899 Other long term (current) drug therapy: Secondary | ICD-10-CM | POA: Diagnosis not present

## 2020-10-06 DIAGNOSIS — E118 Type 2 diabetes mellitus with unspecified complications: Secondary | ICD-10-CM | POA: Diagnosis not present

## 2020-10-06 DIAGNOSIS — M86271 Subacute osteomyelitis, right ankle and foot: Secondary | ICD-10-CM | POA: Diagnosis not present

## 2020-10-06 DIAGNOSIS — N139 Obstructive and reflux uropathy, unspecified: Secondary | ICD-10-CM | POA: Diagnosis not present

## 2020-10-06 DIAGNOSIS — Z7952 Long term (current) use of systemic steroids: Secondary | ICD-10-CM | POA: Diagnosis not present

## 2020-10-06 DIAGNOSIS — E119 Type 2 diabetes mellitus without complications: Secondary | ICD-10-CM | POA: Diagnosis not present

## 2020-10-06 DIAGNOSIS — E1065 Type 1 diabetes mellitus with hyperglycemia: Secondary | ICD-10-CM | POA: Diagnosis present

## 2020-10-06 DIAGNOSIS — N1831 Chronic kidney disease, stage 3a: Secondary | ICD-10-CM | POA: Diagnosis not present

## 2020-10-06 DIAGNOSIS — Z89431 Acquired absence of right foot: Secondary | ICD-10-CM | POA: Diagnosis not present

## 2020-10-06 DIAGNOSIS — I959 Hypotension, unspecified: Secondary | ICD-10-CM | POA: Diagnosis not present

## 2020-10-06 DIAGNOSIS — Z20822 Contact with and (suspected) exposure to covid-19: Secondary | ICD-10-CM | POA: Diagnosis not present

## 2020-10-06 DIAGNOSIS — T879 Unspecified complications of amputation stump: Secondary | ICD-10-CM | POA: Diagnosis not present

## 2020-10-06 DIAGNOSIS — R52 Pain, unspecified: Secondary | ICD-10-CM | POA: Diagnosis not present

## 2020-10-06 DIAGNOSIS — I739 Peripheral vascular disease, unspecified: Secondary | ICD-10-CM | POA: Diagnosis not present

## 2020-10-06 DIAGNOSIS — E10621 Type 1 diabetes mellitus with foot ulcer: Secondary | ICD-10-CM | POA: Diagnosis not present

## 2020-10-06 DIAGNOSIS — L03115 Cellulitis of right lower limb: Secondary | ICD-10-CM | POA: Diagnosis not present

## 2020-10-06 DIAGNOSIS — Z4781 Encounter for orthopedic aftercare following surgical amputation: Secondary | ICD-10-CM | POA: Diagnosis not present

## 2020-10-06 LAB — COMPREHENSIVE METABOLIC PANEL
ALT: 24 U/L (ref 0–44)
AST: 47 U/L — ABNORMAL HIGH (ref 15–41)
Albumin: 1.3 g/dL — ABNORMAL LOW (ref 3.5–5.0)
Alkaline Phosphatase: 63 U/L (ref 38–126)
Anion gap: 9 (ref 5–15)
BUN: 71 mg/dL — ABNORMAL HIGH (ref 6–20)
CO2: 22 mmol/L (ref 22–32)
Calcium: 10.3 mg/dL (ref 8.9–10.3)
Chloride: 110 mmol/L (ref 98–111)
Creatinine, Ser: 1.27 mg/dL — ABNORMAL HIGH (ref 0.44–1.00)
GFR, Estimated: 46 mL/min — ABNORMAL LOW (ref >60)
Glucose, Bld: 96 mg/dL (ref 70–99)
Potassium: 4.8 mmol/L (ref 3.5–5.1)
Sodium: 141 mmol/L (ref 135–145)
Total Bilirubin: 0.4 mg/dL (ref 0.3–1.2)
Total Protein: 4.5 g/dL — ABNORMAL LOW (ref 6.5–8.1)

## 2020-10-06 LAB — CBC WITH DIFFERENTIAL/PLATELET
Abs Immature Granulocytes: 0.08 10*3/uL — ABNORMAL HIGH (ref 0.00–0.07)
Basophils Absolute: 0.1 10*3/uL (ref 0.0–0.1)
Basophils Relative: 1 %
Eosinophils Absolute: 0.1 10*3/uL (ref 0.0–0.5)
Eosinophils Relative: 2 %
HCT: 25.2 % — ABNORMAL LOW (ref 36.0–46.0)
Hemoglobin: 7.7 g/dL — ABNORMAL LOW (ref 12.0–15.0)
Immature Granulocytes: 1 %
Lymphocytes Relative: 16 %
Lymphs Abs: 1 10*3/uL (ref 0.7–4.0)
MCH: 34.4 pg — ABNORMAL HIGH (ref 26.0–34.0)
MCHC: 30.6 g/dL (ref 30.0–36.0)
MCV: 112.5 fL — ABNORMAL HIGH (ref 80.0–100.0)
Monocytes Absolute: 0.3 10*3/uL (ref 0.1–1.0)
Monocytes Relative: 4 %
Neutro Abs: 4.7 10*3/uL (ref 1.7–7.7)
Neutrophils Relative %: 76 %
Platelets: 305 10*3/uL (ref 150–400)
RBC: 2.24 MIL/uL — ABNORMAL LOW (ref 3.87–5.11)
RDW: 17.8 % — ABNORMAL HIGH (ref 11.5–15.5)
WBC: 6.2 10*3/uL (ref 4.0–10.5)
nRBC: 0 % (ref 0.0–0.2)

## 2020-10-06 LAB — GLUCOSE, CAPILLARY
Glucose-Capillary: 93 mg/dL (ref 70–99)
Glucose-Capillary: 174 mg/dL — ABNORMAL HIGH (ref 70–99)

## 2020-10-06 MED ORDER — ACETAMINOPHEN 325 MG PO TABS: 650.0000 mg | ORAL_TABLET | Freq: Four times a day (QID) | ORAL | Status: DC | PRN

## 2020-10-06 MED ORDER — SODIUM BICARBONATE 650 MG PO TABS: 650.0000 mg | ORAL_TABLET | Freq: Two times a day (BID) | ORAL | Status: DC

## 2020-10-06 MED ORDER — INSULIN ASPART 100 UNIT/ML ~~LOC~~ SOLN: 2.0000 [IU] | Freq: Three times a day (TID) | SUBCUTANEOUS | 11 refills | Status: DC

## 2020-10-06 MED ORDER — SERTRALINE HCL 25 MG PO TABS: 25.0000 mg | ORAL_TABLET | Freq: Every day | ORAL | 0 refills | Status: DC

## 2020-10-06 MED ORDER — INSULIN DETEMIR 100 UNIT/ML ~~LOC~~ SOLN: 24.0000 [IU] | Freq: Every day | SUBCUTANEOUS | 11 refills | Status: DC

## 2020-10-06 MED ORDER — HYDROCODONE-ACETAMINOPHEN 5-325 MG PO TABS
1.0000 | ORAL_TABLET | ORAL | 0 refills | Status: DC | PRN
Start: 2020-10-06 — End: 2020-11-07

## 2020-10-06 MED ORDER — JUVEN PO PACK: 1.0000 | PACK | Freq: Two times a day (BID) | ORAL | 0 refills | Status: DC

## 2020-10-06 NOTE — Progress Notes (Signed)
Patient ID: [name], female   DOB: 1961-08-19, 59 y.o.   MRN: [id number] Patient is postop day five transtibial amputation.  Anticipate discharge to skilled nursing.  Plan for discharge with the [name] wound VAC pump there is no drainage in the wound VAC canister at this time.  Patient states she has had episodes of leakage around her Foley catheter.

## 2020-10-06 NOTE — Progress Notes (Signed)
Inpatient Diabetes Program Recommendations  AACE/ADA: New Consensus Statement on Inpatient Glycemic Control (2015)  Target Ranges:  Prepandial:   less than 140 mg/dL      Peak postprandial:   less than 180 mg/dL (1-2 hours)      Critically ill patients:  140 - 180 mg/dL   Lab Results  Component Value Date   GLUCAP 93 10/06/2020   HGBA1C 7.2 (H) 09/26/2020    Review of Glycemic Control Results for [name], [name] (MRN [id number]) as of 10/06/2020 11:04  Ref. Range 10/04/2020 06:44 10/04/2020 11:55 10/04/2020 16:02 10/04/2020 20:29 10/04/2020 22:07 10/05/2020 06:45 10/05/2020 10:57 10/05/2020 16:12 10/05/2020 19:41 10/06/2020 06:50  Glucose-Capillary Latest Ref Range: 70 - 99 mg/dL 122 (H) 124 (H) 81 62 (L) 115 (H) 252 (H) 324 (H) 256 (H) 143 (H) 93  Diabetes history: DM type 1 (makes NO insulin; requires basal, correction, and carbohydrate coverage insulin) Outpatient Diabetes medications: Omnipod insulin pump with Novolog Basal insulin  Total daily basal insulin:19.2units/24 hours  Carb Coverage 1:201 unit for every 20grams of carbohydrates  Insulin Sensitivity 1:881 unit drops blood glucose 88mg /dl  Current orders for Inpatient glycemic control:  Levemir 24 units daily, Novolog 2 units tid with meals, Novolog moderate tid with meals and HS scale  Inpatient Diabetes Program Recommendations:    Fasting CBG=93 mg/dL.  Please consider reducing Levemir back to 20 units daily.  Also please reduce Novolog correction to sensitive tid with meals and HS.    Thanks,  Adah Perl, RN, BC-ADM Inpatient Diabetes Coordinator Pager [phone] (8a-5p)

## 2020-10-06 NOTE — Progress Notes (Signed)
Report given to [place],Staff nurse at [facility]. All questions and concerns were answered.

## 2020-10-06 NOTE — Progress Notes (Signed)
Discharge summary packet/pertinent documents provided to [facility] staff. Pt is alert/oriented in no acute distress. No complaints. D/C to [facility] as ordered.

## 2020-10-06 NOTE — Discharge Summary (Signed)
Physician Discharge Summary  [name] GEX:[id number] DOB: [birth year] DOA: 09/26/2020  PCP: [name], MD  Admit date: 09/26/2020 Discharge date: 10/06/2020  Time spent: 35 minutes  Recommendations for Outpatient Follow-up:  1. Needs Chem-12 and CBC 1 week 2. Discharging with [name] wound VAC pump 3. Note dosage changes of various medications 4. Limited prescription of opiates in addition to refills of psychotropics given 5. Catheter clamping trial will be done prior to discharge however she feels that she will need to be followed up by urologist for Foley catheter adjustment in the outpatient setting 6. Will need follow-up with her transplant physicians and nephrologist in the outpatient setting 7. Will need sliding scale insulin in addition to basal insulin as she is not able to use her insulin pump appropriately 8. Patient had deep tissue pressure injury to coccyx 5 x 3 cm which will require Xeroform dressing gauze and turning from side to side assistance with mobility  Discharge Diagnoses:  Principal Problem:   Diabetic ketoacidosis ([place]) Active Problems:   Anemia secondary to renal failure   History of simultaneous kidney and pancreas transplant ([place])   Chronic kidney disease (CKD) stage G3a/A1, moderately decreased glomerular filtration rate (GFR) between 45-59 mL/min/1.73 square meter and albuminuria creatinine ratio less than 30 mg/g (HCC)   Hypertension   Acute kidney injury superimposed on CKD (HCC)   Cellulitis and abscess of right leg   DKA, type 1 (HCC)   Pressure injury of skin   Gangrene of right foot ([place])   Subacute osteomyelitis of right foot [facility])   Discharge Condition: Improved  Diet recommendation: Diabetic  Filed Weights   10/04/20 0500 10/05/20 0500 10/06/20 0500  Weight: 57.6 kg 57 kg 55.5 kg    History of present illness:  59 year old female with chronic DM TY 1 with insulin pump, chronic kidney disease stage IIIa Pancreatic/renal  transplant on immunosuppressants with prednisone Left heel ulcers in the past followed by wound care [facility]-but has a worsening purulent ulcer on the right dorsum of the foot came to the emergency room feeling weak uncomfortable White count was 15.5 hemoglobin 7.7 BUNs/creatinine 106/3.6 (baseline 1.33) blood glucose 734  Patient found to have DKA started on insulin drip and eventually transition to usual medications  Patient underwent transtibial amputation right foot Dr. Sharol Given 10/6  Hospital Course:  1. DKA on admission now resolved in the setting of TY 1 DM a. prior on insulin pump but change to sliding scale b. CBG's 252-324  c. incr Levemir 20 -->24 units in addition to 2 units of regular insulin 3 times a day with meals and moderate sliding scale d. Outpatient further follow-up with an endocrinologist etc. as needed 2. Purulent cellulitis with diabetic wound on right foot, superficial culture Pseudomonas/Serratia a. Status post amputation right side 10/6 b. Antibiotics discontinued 10/7 c. Cleaning L sided Heel-sided wounds with normal saline Xeroform gauze and helping with silicone foam and Prevalon boots-can be changed daily 3. Deep pressure injury to coccyx 1. Requires turning with Xeroform dressing every 2 hours at facility 4. Acute kidney injury superimposed on CKD 3 5. Hyperkalemia as well as metabolic acidosis 6. admission creatinine was 3.5 this was likely prerenal improved with hydration a. Held off Zestril 5 b. Renal US without hydronephrosis c. Hyperkalemia Rx 10/02/2020 Lokelma x1-potassium slightly better-we will discharge on Lokelma and will need outpatient follow-up d. Kidney function same-maybe her new baseline e. Start 10/03/2020 bicarb 650 twice daily and this will be continued in the outpatient  setting 7. Kidney 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 a. Continue azathioprine 50 daily, cyclosporine 75 twice daily and prednisone 5 mg  daily b. Outpatient follow-up with specialists and transplant team in [facility] 8. HTN a. Continue metoprolol 12.5 daily-lisinopril discontinued 9. Acute on chronic anemia a. Patient has macrocytic anemia however iron stores are low with iron 11 TSAT 10 therefore we will give IV iron  b. Will need repeat check on labs next 3 weeks  Procedures:  Transtibial amputation Dr. Sharol Given 10/6  Consultations:  Orthopedics Dr. 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   atorvastatin 20 MG tablet Commonly known as: LIPITOR Take 10 mg by mouth daily.   azaTHIOprine 50 MG tablet Commonly known as: IMURAN Take 50 mg by mouth daily.   cycloSPORINE 25 MG capsule Commonly known as: SANDIMMUNE Take 75 mg by mouth 2 (two) times daily.   HYDROcodone-acetaminophen 5-325 MG tablet Commonly known as: NORCO/VICODIN Take 1 tablet by mouth every 4 (four) hours as needed for moderate pain.   insulin aspart 100 UNIT/ML injection Commonly known as: novoLOG Inject 2 Units into the skin  3 (three) times daily with meals. What changed:   how much to take  when to take this  additional instructions   insulin detemir 100 UNIT/ML injection Commonly known as: LEVEMIR Inject 0.24 mLs (24 Units total) into the skin daily.   levothyroxine 50 MCG tablet Commonly known as: SYNTHROID Take 50 mcg by mouth daily.   metoprolol tartrate 25 MG tablet Commonly known as: LOPRESSOR Take 12.5 mg by mouth daily.   nutrition supplement (JUVEN) Pack Take 1 packet by mouth 2 (two) times daily between meals.   predniSONE 5 MG tablet Commonly known as: DELTASONE Take 5 mg by mouth daily.   sertraline 25 MG tablet Commonly known as: ZOLOFT Take 1 tablet (25 mg total) by mouth daily.   sodium bicarbonate 650 MG tablet Take 1 tablet (650 mg total) by mouth 2 (two) times daily.      No Known Allergies  Follow-up Information    Persons, [place]ely [place], [place] In 1 week.   Specialty: Orthopedic Surgery Contact information: [place] [address] [phone]                The results of significant diagnostics from this hospitalization (including imaging, microbiology, ancillary and laboratory) are listed below for reference.    Significant Diagnostic Studies: DG Chest 1 View  Result Date: 09/26/2020 CLINICAL DATA:  Found on the floor. EXAM: CHEST  1 VIEW COMPARISON:  None. FINDINGS: Mild, diffuse, chronic appearing increased lung markings are seen. There is no evidence of acute infiltrate, pleural effusion or pneumothorax. The heart size and mediastinal contours are within normal limits. Radiopaque surgical clips are seen within the soft tissues of the lateral left chest wall. A chronic deformity of the sixth right rib is seen. IMPRESSION: No acute cardiopulmonary disease. Electronically Signed   By: Virgina Norfolk M.D.   On: 09/26/2020 18:11   CT Head Wo Contrast  Result Date: 09/26/2020 CLINICAL DATA:  Found on the floor. EXAM: CT HEAD WITHOUT CONTRAST  TECHNIQUE: Contiguous axial images were obtained from the base of the skull through the vertex without intravenous contrast. COMPARISON:  None. FINDINGS: Brain: There is mild cerebral atrophy with widening of the extra-axial spaces and ventricular dilatation. There are areas of decreased attenuation within the white matter tracts of the supratentorial brain, consistent with microvascular disease changes. Vascular: No hyperdense vessel or unexpected calcification. Skull: Normal. Negative for fracture or focal lesion. Sinuses/Orbits: No acute finding. Other: None. IMPRESSION: 1. Generalized cerebral atrophy. 2. No acute intracranial abnormality. Electronically Signed   By: Virgina Norfolk M.D.   On: 09/26/2020 18:21   MR THORACIC SPINE WO CONTRAST  Result Date: 09/16/2020 CLINICAL DATA:  59 year old female with progressed chronic back pain. Neurologic deficit. EXAM: MRI THORACIC SPINE WITHOUT CONTRAST TECHNIQUE: Multiplanar, multisequence MR imaging of the thoracic spine was performed. No intravenous contrast was administered. COMPARISON:  Thoracolumbar radiographs 06/17/2020. Lumbar MRI today reported separately. FINDINGS: Limited cervical spine imaging: Multilevel degenerative appearing cervical spondylolisthesis (series 16, image 6) with evidence of advanced associated disc degeneration. Probable mild cervical spinal stenosis at C3-C4. Thoracic spine segmentation:  Appears to be normal. Alignment:  Preserved thoracic kyphosis.  No spondylolisthesis. Vertebrae: Normal bone marrow signal. No marrow edema or evidence of acute osseous abnormality. Possible subtle or mild chronic superior endplate compression deformities at T3, T4, T5, T7, T8, T9. No associated retropulsion of bone or complicating features. Cord: Normal thoracic spinal cord. Capacious thoracic spinal canal. The conus medullaris is partially visible at T12-L1. Paraspinal and other soft tissues: Capacious thoracic esophagus, although not dilated  only mildly *FIELD* with gas. Otherwise negative visible thoracic and upper abdominal viscera. Negative thoracic paraspinal soft tissues. Disc levels: T1-T2: Negative. T2-T3: Negative. T3-T4: Negative. T4-T5: Negative. T5-T6: Negative. T6-T7: Negative. T7-T8: Negative aside from mild epidural lipomatosis. T8-T9: Negative aside from mild epidural lipomatosis. T9-T10: Small central to right paracentral disc protrusion (series 17, image 8 and series 20, image 30). But no associated spinal stenosis. Mild bilateral T9 foraminal stenosis related to mild facet hypertrophy. T10-T11: Mild facet hypertrophy. Mild bilateral T10 foraminal stenosis. T11-T12: Mild circumferential disc bulge. Mild epidural lipomatosis. No stenosis. IMPRESSION: 1. No acute osseous abnormality in the thoracic spine. Chronic subtle or mild superior endplate compression from T3 through T9. No associated retropulsion of bone or complicating features. 2. Mild for age thoracic spine degeneration and capacious thoracic spinal canal with no spinal stenosis. There is a small degenerative disc herniation at T9-T10. Mild bilateral T9 and T10 neural foraminal stenosis is related to facet hypertrophy. 3. Partially visible cervical spine demonstrates fairly advanced multilevel spondylolisthesis. Suspected mild cervical spinal stenosis at C3-C4. Electronically Signed   By: Genevie Ann M.D.   On: 09/16/2020 14:06   MR LUMBAR SPINE WO CONTRAST  Result Date: 09/16/2020 CLINICAL DATA:  59 year old female with progressed chronic back pain.  Neurologic deficit. EXAM: MRI LUMBAR SPINE WITHOUT CONTRAST TECHNIQUE: Multiplanar, multisequence MR imaging of the lumbar spine was performed. No intravenous contrast was administered. COMPARISON:  Thoracic spine MRI today reported separately. Thoracic spine MRI 07/28/2020. thoracolumbar radiographs 06/17/2020. FINDINGS: Segmentation: Normal, concordant with the thoracic spine numbering today in the same numbering system used on  the MRI last month. Alignment: Stable since last month. Dextroconvex thoracolumbar junction and levoconvex lumbar scoliosis with straightening of lumbar lordosis. No spondylolisthesis. Vertebrae: Mild chronic appearing compression of the left T12 superior endplate is evident on these images, in addition to several levels of subtle chronic compression suspected on the thoracic MRI today reported separately. Advanced chronic endplate degeneration again noted at L1-L2 and L2-L3. no endplate marrow edema, despite new T2 and STIR hyperintense signal within the posterior disc space at L2-L3 (series 7, image 8). Normal background bone marrow signal. Intact visible sacrum and SI joints. Conus medullaris and cauda equina: Conus extends to the L1-L2 level. No lower spinal cord or conus signal abnormality. Paraspinal and other soft tissues: Chronic renal atrophy. Negative visible other abdominal viscera. Negative paraspinal soft tissues, no convincing paraspinal inflammation. Disc levels: L1-L2: Severe disc space loss. Circumferential disc osteophyte complex. No significant spinal stenosis. Moderate to severe left L1 foraminal stenosis. This level is stable. L2-L3: Severe disc space loss. Circumferential disc osteophyte complex eccentric to the right. No significant spinal or lateral recess stenosis. Mild left and mild-to-moderate right L2 foraminal stenosis. L3-L4: Mild disc bulge and posterior element hypertrophy. Mild epidural lipomatosis. No significant stenosis. L4-L5: Mild disc bulge. Mild to moderate posterior element hypertrophy. Mild epidural lipomatosis. No stenosis. L5-S1: Mild disc bulge, posterior element hypertrophy and epidural lipomatosis. No stenosis. IMPRESSION: 1. Chronically advanced disc and endplate degeneration at L1-L2 and L2-L3. There is new fluid signal within the posterior disc space at L2-L3 since last month, but no convincing endplate or paraspinal inflammation to strongly suggest discitis.  However, if this patient is on dialysis then consider the possibility of early discitis, correlate for bacteremia, and recommend short interval repeat Lumbar MRI in 2 weeks. 2. No lumbar spinal stenosis. Moderate to severe left L1 and mild to moderate bilateral L2 neural foraminal stenosis is stable. Electronically Signed   By: Genevie Ann M.D.   On: 09/16/2020 14:23   US RENAL  Result Date: 09/27/2020 CLINICAL DATA:  Acute kidney injury. EXAM: RENAL / URINARY TRACT ULTRASOUND COMPLETE COMPARISON:  None. FINDINGS: Right Kidney: Not visualized. Left Kidney: Not visualized. Bladder: Hypoechoic area measuring 3.5 x 2.7 x 0.9 cm is noted posteriorly in the urinary bladder which may represent debris, but possible mass cannot be excluded. Cystoscopy is recommended for further evaluation. Other: Small bilateral pleural effusions are noted incidentally. IMPRESSION: 1. Kidneys are not visualized. 2. 3.5 x 2.7 x 0.9 cm hypoechoic area is noted posteriorly in the urinary bladder which may represent debris, but possible mass cannot be excluded. Cystoscopy is recommended for further evaluation. Electronically Signed   By: Marijo Conception M.D.   On: 09/27/2020 16:47   DG Foot Complete Right  Result Date: 09/26/2020 CLINICAL DATA:  Right foot wound. EXAM: RIGHT FOOT COMPLETE - 3+ VIEW COMPARISON:  None. FINDINGS: There is diffuse osteopenia. There is no evidence of acute fracture or dislocation. A radiopaque intramedullary rod is seen extending through the distal right tibia into the right talus and right calcaneus. Associated chronic deformities are seen within these regions. A chronic deformity of the distal left fibula is also noted. A radiopaque surgical  screw is seen overlying the interphalangeal joint of the right great toe. A thin, 1.3 cm linear radiopaque soft tissue foreign body is seen adjacent to the plantar aspect of the fifth right metatarsal. IMPRESSION: 1. Chronic and postoperative changes, as described above,  without evidence of an acute fracture. Electronically Signed   By: Virgina Norfolk M.D.   On: 09/26/2020 18:16   VAS [place] ABI WITH/WO TBI  Result Date: 09/28/2020 LOWER EXTREMITY DOPPLER STUDY Indications: Rest pain, and ulceration. High Risk Factors: Diabetes. Other Factors: History of pancreatic and renal transplant.  Limitations: Today's exam was limited due to patient positioning and bandages. Comparison Study: No prior study on file for comparison Performing Technologist: Sharion Dove RVS  Examination Guidelines: A complete evaluation includes at minimum, Doppler waveform signals and systolic blood pressure reading at the level of bilateral brachial, anterior tibial, and posterior tibial arteries, when vessel segments are accessible. Bilateral testing is considered an integral part of a complete examination. Photoelectric Plethysmograph (PPG) waveforms and toe systolic pressure readings are included as required and additional duplex testing as needed. Limited examinations for reoccurring indications may be performed as noted.  ABI Findings: +---------+------------------+-----+-------------------+--------+ Right    Rt Pressure (mmHg)IndexWaveform           Comment  +---------+------------------+-----+-------------------+--------+ Brachial 127                    multiphasic                 +---------+------------------+-----+-------------------+--------+ PTA      244               1.78 dampened monophasic         +---------+------------------+-----+-------------------+--------+ DP       80                0.58 dampened monophasic         +---------+------------------+-----+-------------------+--------+ Great Toe0                 0.00                             +---------+------------------+-----+-------------------+--------+ +---------+------------------+-----+-----------+-------+ Left     Lt Pressure (mmHg)IndexWaveform   Comment  +---------+------------------+-----+-----------+-------+ Brachial 137                    multiphasic        +---------+------------------+-----+-----------+-------+ PTA      77                0.56 monophasic         +---------+------------------+-----+-----------+-------+ DP       254               1.85 monophasic         +---------+------------------+-----+-----------+-------+ Great Toe29                0.21                    +---------+------------------+-----+-----------+-------+ +-------+-----------+-----------+------------+------------+ ABI/TBIToday's ABIToday's TBIPrevious ABIPrevious TBI +-------+-----------+-----------+------------+------------+ Right  1.78       0                                   +-------+-----------+-----------+------------+------------+ Left   1.85       0.21                                +-------+-----------+-----------+------------+------------+  Arterial wall calcification precludes accurate ankle pressures and ABIs.  Summary: Right: Resting right ankle-brachial index indicates noncompressible right lower extremity arteries. The right toe-brachial index is abnormal. Left: Resting left ankle-brachial index indicates noncompressible left lower extremity arteries. The left toe-brachial index is abnormal.  *See table(s) above for measurements and observations.  Electronically signed by Harold Barban MD on 09/28/2020 at 7:43:38 PM.    Final    VAS [place] LOWER EXTREMITY ARTERIAL DUPLEX  Result Date: 09/28/2020 LOWER EXTREMITY ARTERIAL DUPLEX STUDY Indications: Rest pain, ulceration, and purple, cool foot. High Risk Factors: Diabetes. Other Factors: History of pancreatic and renal transplant.  Current ABI: B: non compressible with abnormal TBIs Limitations: Positioning, foot deformity, body habitus Comparison Study: No prior study on file for comparison Performing Technologist: Sharion Dove RVS  Examination Guidelines: A complete evaluation  includes B-mode imaging, spectral Doppler, color Doppler, and power Doppler as needed of all accessible portions of each vessel. Bilateral testing is considered an integral part of a complete examination. Limited examinations for reoccurring indications may be performed as noted.  +----------+--------+-----+---------------+----------+--------+ RIGHT     PSV cm/sRatioStenosis       Waveform  Comments +----------+--------+-----+---------------+----------+--------+ CFA Prox  79                          biphasic           +----------+--------+-----+---------------+----------+--------+ DFA       127                         biphasic           +----------+--------+-----+---------------+----------+--------+ SFA Prox  33                          monophasic         +----------+--------+-----+---------------+----------+--------+ SFA Mid   232          50-74% stenosismonophasic         +----------+--------+-----+---------------+----------+--------+ SFA Distal33                          monophasic         +----------+--------+-----+---------------+----------+--------+ POP Prox  55                          monophasic         +----------+--------+-----+---------------+----------+--------+ POP Distal287          50-74% stenosismonophasic         +----------+--------+-----+---------------+----------+--------+ ATA Prox  33                          monophasic         +----------+--------+-----+---------------+----------+--------+ ATA Mid   38                          monophasic         +----------+--------+-----+---------------+----------+--------+ ATA Distal40                          monophasic         +----------+--------+-----+---------------+----------+--------+ PTA Mid   17                          monophasic         +----------+--------+-----+---------------+----------+--------+  PTA Distal10                          monophasic          +----------+--------+-----+---------------+----------+--------+ A focal velocity elevation of was obtained at Mid SFA. Findings are characteristic of 50-74% stenosis. A 2nd focal velocity elevation was visualized, measuring at Distal popliteal. Findings are characteristic of 50-74% stenosis.  Summary: Right: 50-74% stenosis noted in the superficial femoral artery. 50-74% stenosis noted in the popliteal artery. Diffuse calcific plaque noted throughout with abnormal waveforms.  See table(s) above for measurements and observations. Electronically signed by Harold Barban MD on 09/28/2020 at 7:43:49 PM.    Final     Microbiology: Recent Results (from the past 240 hour(s))  Culture, blood (routine x 2)     Status: None   Collection Time: 09/26/20  4:45 PM   Specimen: BLOOD  Result Value Ref Range Status   Specimen Description   Final    BLOOD LEFT ANTECUBITAL Performed at [facility], [place] [address], [place], [place] [address]    Special Requests   Final    BOTTLES DRAWN AEROBIC AND ANAEROBIC Blood Culture adequate volume Performed at [place] [address], [place], [place] [address]    Culture   Final    NO GROWTH 5 DAYS Performed at [facility], [place] [address], [place], [place] [address]    Report Status 10/01/2020 FINAL  Final  Culture, blood (routine x 2)     Status: None   Collection Time: 09/26/20  4:55 PM   Specimen: BLOOD RIGHT FOREARM  Result Value Ref Range Status   Specimen Description   Final    BLOOD RIGHT FOREARM Performed at [place] [address], [place], [place] [address]    Special Requests   Final    BOTTLES DRAWN AEROBIC AND ANAEROBIC Blood Culture results may not be optimal due to an inadequate volume of blood received in culture bottles Performed at [place] [address] [place], [place] [address]    Culture   Final    NO GROWTH 5 DAYS Performed at [facility], [place] [address], [place], [place] [address]    Report Status 10/01/2020 FINAL  Final  Respiratory Panel by RT PCR (Flu A&B, Covid) - Nasopharyngeal Swab     Status: None   Collection Time: 09/26/20  5:06 PM   Specimen: Nasopharyngeal Swab  Result Value Ref Range Status   SARS Coronavirus 2 by RT PCR NEGATIVE NEGATIVE Final    Comment: (NOTE) SARS-CoV-2 target nucleic acids are NOT DETECTED.  The SARS-CoV-2 RNA is generally detectable in upper respiratoy specimens during the acute phase of infection. The lowest concentration of SARS-CoV-2 viral copies this assay can detect is 131 copies/mL. A negative result does not preclude SARS-Cov-2 infection and should not be used as the sole basis for treatment or other patient management decisions. A negative result may occur with  improper specimen collection/handling, submission of specimen other than nasopharyngeal swab, presence of viral mutation(s) within the areas targeted by this assay, and inadequate number of viral copies (<131 copies/mL). A negative result must be combined with clinical observations, patient history, and epidemiological information. The expected result is Negative.  Fact Sheet for Patients:  PinkCheek.be  Fact Sheet for Healthcare Providers:  GravelBags.it  This test is no t yet approved or cleared by the [place] and  has been authorized for detection and/or diagnosis of SARS-CoV-2 by FDA under an Emergency Use Authorization (EUA). This EUA will remain  in effect (meaning this test can be used) for the duration of the COVID-19 declaration under Section 564(b)(1) of the Act, 21 U.S.C. section 360bbb-3(b)(1), unless the authorization is terminated or revoked sooner.     Influenza A by PCR NEGATIVE NEGATIVE Final   Influenza B by PCR NEGATIVE NEGATIVE Final    Comment: (NOTE) The Xpert Xpress SARS-CoV-2/FLU/RSV assay is intended as an aid  in  the diagnosis of influenza from Nasopharyngeal swab specimens and  should not be used as a sole basis for treatment. Nasal washings and  aspirates are unacceptable for Xpert Xpress SARS-CoV-2/FLU/RSV  testing.  Fact Sheet for Patients: PinkCheek.be  Fact Sheet for Healthcare Providers: GravelBags.it  This test is not yet approved or cleared by the [place] FDA and  has been authorized for detection and/or diagnosis of SARS-CoV-2 by  FDA under an Emergency Use Authorization (EUA). This EUA will remain  in effect (meaning this test can be used) for the duration of the  Covid-19 declaration under Section 564(b)(1) of the Act, 21  U.S.C. section 360bbb-3(b)(1), unless the authorization is  terminated or revoked. Performed at [facility], [place] [address], [place], [place] [address]   Aerobic Culture (superficial specimen)     Status: None   Collection Time: 09/26/20  5:34 PM   Specimen: Foot; Wound  Result Value Ref Range Status   Specimen Description   Final    FOOT RIGHT Performed at [place] [address] [place], [place] [address]    Special Requests   Final    NONE Performed at [facility], [place] [address], [place], [place] [address]    Gram Stain   Final    NO WBC SEEN MODERATE GRAM NEGATIVE RODS Performed at [facility], [place] [address], [place],  [address]    Culture   Final    ABUNDANT SERRATIA MARCESCENS MODERATE PSEUDOMONAS AERUGINOSA    Report Status 09/30/2020 FINAL  Final   Organism ID, Bacteria SERRATIA MARCESCENS  Final   Organism ID, Bacteria PSEUDOMONAS AERUGINOSA  Final      Susceptibility   Pseudomonas aeruginosa - MIC*    CEFTAZIDIME 4 SENSITIVE Sensitive     CIPROFLOXACIN 1 SENSITIVE Sensitive     GENTAMICIN <=1 SENSITIVE Sensitive     IMIPENEM 2 SENSITIVE Sensitive     * MODERATE PSEUDOMONAS AERUGINOSA    Serratia marcescens - MIC*    CEFAZOLIN >=64 RESISTANT Resistant     CEFEPIME <=0.12 SENSITIVE Sensitive     CEFTAZIDIME <=1 SENSITIVE Sensitive     CEFTRIAXONE <=0.25 SENSITIVE Sensitive     CIPROFLOXACIN <=0.25 SENSITIVE Sensitive     GENTAMICIN <=1 SENSITIVE Sensitive     TRIMETH/SULFA <=20 SENSITIVE Sensitive     * ABUNDANT SERRATIA MARCESCENS  MRSA PCR Screening     Status: None   Collection Time: 09/27/20  4:32 AM   Specimen: Nasal Mucosa; Nasopharyngeal  Result Value Ref Range Status   MRSA by PCR NEGATIVE NEGATIVE Final    Comment:        The GeneXpert MRSA Assay (FDA approved for NASAL specimens only), is one component of a comprehensive MRSA colonization surveillance program. It is not intended to diagnose MRSA infection nor to guide or monitor treatment for MRSA infections. Performed at [facility], [address] [place], [place]  [address]   Surgical pcr screen     Status: Abnormal   Collection Time: 09/30/20  5:45 PM   Specimen: Nasal Mucosa; Nasal Swab  Result Value Ref Range Status   MRSA, PCR NEGATIVE NEGATIVE Final   Staphylococcus aureus POSITIVE (A) NEGATIVE Final    Comment: (NOTE) The Xpert SA Assay (FDA approved for NASAL specimens in patients 45 years of age and older), is one component of a comprehensive surveillance program. It is not intended to diagnose infection nor to guide or monitor treatment. Performed at [facility], [place] [address], [place], [place] [address]   SARS Coronavirus 2 by RT PCR (hospital order, performed in [facility] hospital lab) Nasopharyngeal Nasopharyngeal Swab     Status: None   Collection Time: 10/05/20  7:50 PM   Specimen: Nasopharyngeal Swab  Result Value Ref Range Status   SARS Coronavirus 2 NEGATIVE NEGATIVE Final    Comment: (NOTE) SARS-CoV-2 target nucleic acids are NOT DETECTED.  The SARS-CoV-2 RNA is generally detectable in upper and lower respiratory specimens during the acute  phase of infection. The lowest concentration of SARS-CoV-2 viral copies this assay can detect is 250 copies / mL. A negative result does not preclude SARS-CoV-2 infection and should not be used as the sole basis for treatment or other patient management decisions.  A negative result may occur with improper specimen collection / handling, submission of specimen other than nasopharyngeal swab, presence of viral mutation(s) within the areas targeted by this assay, and inadequate number of viral copies (<250 copies / mL). A negative result must be combined with clinical observations, patient history, and epidemiological information.  Fact Sheet for Patients:   StrictlyIdeas.no  Fact Sheet for Healthcare Providers: BankingDealers.co.za  This test is not yet approved or  cleared by the [place] FDA and has been authorized for detection and/or diagnosis of SARS-CoV-2 by FDA under an Emergency Use Authorization (EUA).  This EUA will remain in effect (meaning this test can be used) for the duration of the COVID-19 declaration under Section 564(b)(1) of the Act, 21 U.S.C. section 360bbb-3(b)(1), unless the authorization is terminated or revoked sooner.  Performed at [facility], [place] [address], [place], [place] [address]      Labs: Basic Metabolic Panel: Recent Labs  Lab 10/02/20 0335 10/03/20 0542 10/04/20 0353 10/05/20 0023 10/06/20 0712  NA 135 136 140 139 141  K 6.2* 5.6* 4.8 4.8 4.8  CL 108 108 111 109 110  CO2 19* 18* 19* 23 22  GLUCOSE 382* 153* 118* 114* 96  BUN 61* 68* 63* 69* 71*  CREATININE 1.34* 1.29* 1.23* 1.11* 1.27*  CALCIUM 9.5 9.7 10.1 10.1 10.3   Liver Function Tests: Recent Labs  Lab 10/02/20 0335 10/03/20 0542 10/04/20 0353 10/05/20 0023 10/06/20 0712  AST 22 23 28 28  47*  ALT 13 12 13 14 24   ALKPHOS 68 56 63 54 63  BILITOT 1.1 0.7 0.6 0.4 0.4  PROT 4.6* 4.2* 4.5* 4.3* 4.5*  ALBUMIN 1.1*  1.0* 1.1* 1.1* 1.3*   No results for input(s): LIPASE, AMYLASE in the last 168 hours. No results for input(s): AMMONIA in the last 168 hours. CBC: Recent Labs  Lab 10/02/20 0207 10/03/20 0542 10/04/20 0353 10/05/20 0023 10/06/20 0712  WBC 5.7 6.8 5.6 6.2 6.2  NEUTROABS 5.3 5.4 4.3 4.9 4.7  HGB 7.9* 7.4* 7.7* 7.5* 7.7*  HCT 25.1* 24.1* 25.7* 25.1* 25.2*  MCV 112.1* 114.2* 112.7* 112.1* 112.5*  PLT 295 295  307 319 305   Cardiac Enzymes: No results for input(s): CKTOTAL, CKMB, CKMBINDEX, TROPONINI in the last 168 hours. BNP: BNP (last 3 results) No results for input(s): BNP in the last 8760 hours.  ProBNP (last 3 results) No results for input(s): PROBNP in the last 8760 hours.  CBG: Recent Labs  Lab 10/05/20 0645 10/05/20 1057 10/05/20 1612 10/05/20 1941 10/06/20 0650  GLUCAP 252* 324* 256* 143* 93       Signed:  Nita Sells MD   [facility] 10/06/2020, 8:52 AM

## 2020-10-06 NOTE — Progress Notes (Signed)
Occupational Therapy Treatment Patient Details Name: [name] MRN: [id number] DOB: [birth year] Today's Date: 10/06/2020    History of present illness 59 yo female admitted s/p fall and acute kidney injury. pt's R leg has cellulitis and abscesses and chronic diabetic foot infection. 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 However, pt reporting fatigue from RN dressing changes and peri care bed level today. Pt noted with L prevalon boot. Provided PROM stretching to L ankle and repositioned prevalon boot to allow optimal joint positioning. Pt demonstrated ability to complete grooming tasks bed level with encouragement to activate core muscles and lean forward during task. Plan to locate drop arm recliner and coordinate OOB transfers as appropriate next session. Pt would also benefit from UE HEP education to increase strength.   Follow Up Recommendations  SNF;Supervision/Assistance - 24 hour    Equipment Recommendations  Wheelchair cushion (measurements OT);Wheelchair (measurements OT);Hospital bed    Recommendations for Other Services Other (comment) (palliative)    Precautions / Restrictions Precautions Precautions: Fall Precaution Comments: risk for skin break down, wound vac Required Braces or Orthoses: Other Brace Other Brace: R BKA brace, L prevalon boot Restrictions Weight Bearing Restrictions: Yes RLE Weight Bearing: Non weight bearing       Mobility Bed Mobility Overal bed mobility: Needs Assistance   Rolling: Mod assist         General bed mobility comments: cus for hand placement, Pt rolled to left side using bed rail and mod therapist assist on bed pads. Pillow placed under right side for bed pressure relief.  Transfers                  General transfer comment: Pt declined sitting EOB and transfers due to fatigue.    Balance                                           ADL either performed or assessed with clinical judgement   ADL Overall ADL's : Needs assistance/impaired     Grooming: Set up;Bed level;Wash/dry face;Oral care Grooming Details (indicate cue type and reason): Setup at bed level for grooming tasks. With HOB elevated to assist, encouraged pt to attempt lifting back from bed to activate core muscles with pt difficulty completing                                General ADL Comments: Pt with decreased strength (UE, core, and LE), endurance, cardiopulmonary tolerance, sitting balance and problem solving      Vision   Vision Assessment?: Vision impaired- to be further tested in functional context   Perception     Praxis      Cognition Arousal/Alertness: Awake/alert Behavior During Therapy: WFL for tasks assessed/performed Overall Cognitive Status: No family/caregiver present to determine baseline cognitive functioning Area of Impairment: Safety/judgement;Awareness;Problem solving                         Safety/Judgement: Decreased awareness of safety;Decreased awareness of deficits Awareness: Emergent Problem Solving: Difficulty sequencing;Requires verbal cues;Requires tactile cues  Exercises Amputee Exercises Quad Sets: AROM;Strengthening;Both;10 reps;Supine Hip ABduction/ADduction: AAROM;Strengthening;Both;10 reps;Supine Straight Leg Raises: AAROM;Strengthening;Both;10 reps;Supine Other Exercises Other Exercises: PROM stretch of L heel for improved placement of prevalon boot   Shoulder Instructions       General Comments Pt just finished with RN and stated she was tired and not able to do therapy today. Pt was agreeable to LE ther ex, bed mobility for pressure relief and also self care of brushing her teeth.     Pertinent Vitals/ Pain       Pain Assessment: Faces Faces Pain Scale: Hurts a little bit Pain Location: R Leg Pain Descriptors / Indicators: Discomfort Pain Intervention(s): Monitored during session;Repositioned  Home Living                                          Prior Functioning/Environment              Frequency  Min 2X/week        Progress Toward Goals  OT Goals(current goals can now be found in the care plan section)  Progress towards OT goals: Progressing toward goals  Acute Rehab OT Goals Patient Stated Goal: to get therapy OT Goal Formulation: With patient Time For Goal Achievement: 10/16/20 Potential to Achieve Goals: Fair ADL Goals Pt Will Perform Upper Body Bathing: with mod assist;bed level Pt Will Transfer to Toilet: with +2 assist;with max assist;bedside commode Additional ADL Goal #1: pt will complete bed mobility total +2 min (A) as precursor to adls Additional ADL Goal #2: pt will complete BIL UE HEP mod (A) with handout  Plan Discharge plan remains appropriate    Co-evaluation    PT/OT/SLP Co-Evaluation/Treatment: Yes Reason for Co-Treatment: Complexity of the patient's impairments (multi-system involvement);For patient/therapist safety;To address functional/ADL transfers PT goals addressed during session: Strengthening/ROM;Mobility/safety with mobility OT goals addressed during session: ADL's and self-care      AM-PAC OT "6 Clicks" Daily Activity     Outcome Measure   Help from another person eating meals?: A Little Help from another person taking care of personal grooming?: A Little Help from another person toileting, which includes using toliet, bedpan, or urinal?: Total Help from another person bathing (including washing, rinsing, drying)?: Total Help from another person to put on and taking off regular upper body clothing?: A Little Help from another person to put on and taking off regular lower body clothing?: Total  6 Click Score: 12    End of Session    OT Visit Diagnosis: Unsteadiness on feet (R26.81);Pain;Muscle weakness (generalized) (M62.81);History of falling (Z91.81);Other abnormalities of gait and mobility (R26.89);Repeated falls (R29.6) Pain - Right/Left: Right Pain - part of body: Leg   Activity Tolerance Patient limited by fatigue   Patient Left in bed;with call bell/phone within reach;with bed alarm set   Nurse Communication Mobility status        Time: 2841-3244 OT Time Calculation (min): 24 min  Charges: OT General Charges $OT Visit: 1 Visit OT Treatments $Self Care/Home Management : 8-22 mins  Layla Maw, OTR/L   Layla Maw 10/06/2020, 1:38 PM

## 2020-10-06 NOTE — TOC Progression Note (Addendum)
Transition of Care [facility]) - Progression Note    Patient Details  Name: Felicidad Sugarman MRN: [id number] Date of Birth: 05-May-1961  Transition of [facility]) CM/SW Contact  Sharin Mons, RN Phone Number: 10/06/2020, 10:14 AM  Clinical Narrative:    NCM spoke with [facility] admission liaison, Dedra, regarding SNF authorization. Dedra informed NCM insurance initiated ... approval pending. TOC team will will continue to monitor and follow .Marland KitchenMarland KitchenMarland Kitchen.    10/06/2020 @ 11:00 am -  updated PT/OT notes requested by SNF admissions. NCM made PT/OT aware...  Expected Discharge Plan: Skilled Nursing Facility Barriers to Discharge: Insurance Authorization  Expected Discharge Plan and Services Expected Discharge Plan: [place]       Living arrangements for the past 2 months: Apartment                                       Social Determinants of Health (SDOH) Interventions    Readmission Risk Interventions No flowsheet data found.

## 2020-10-06 NOTE — TOC Transition Note (Signed)
Transition of Care [facility]) - CM/SW Discharge Note   Patient Details  Name: [name] MRN: [id number] Date of Birth: 05-02-1961  Transition [facility]) CM/SW Contact:  Sharin Mons, RN Phone Number: [phone] 10/06/2020, 2:03 PM   Clinical Narrative:    Patient will DC to: [facility] Anticipated DC date: 10/06/2020 Family notified: husband to be notified by wife Transport by: [facility]  SNF INSURANCE AUTHORIZATION  received for SNF placement. Per MD patient ready for DC today. RN, patient, and patient's husband notified of DC. Discharge Summary and FL2 sent to facility. RN to call report prior to discharge [phone]). Rm# [id number]. DC packet on chart. Ambulance transport requested for patient.   RNCM will sign off for now as intervention is no longer needed. Please consult [place] again if new needs arise.     Barriers to Discharge: No Barriers Identified   Patient Goals and CMS Choice Patient states their goals for this hospitalization and ongoing recovery are:: to get better CMS Medicare.gov Compare Post Acute Care list provided to:: Patient Choice offered to / list presented to : Patient  Discharge Placement                       Discharge Plan and Services                                     Social Determinants of Health (SDOH) Interventions     Readmission Risk Interventions No flowsheet data found.

## 2020-10-06 NOTE — Progress Notes (Signed)
Physical Therapy Treatment Patient Details Name: [name] MRN: [id number] DOB: 14-Jan-1961 Today's Date: 10/06/2020    History of Present Illness 59 yo female admitted s/p fall and acute kidney injury. pt's R leg has cellulitis and abscesses and chronic diabetic foot infection. 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 Pt did agree to bilateral LE exercises. Pt is generally deconditioned requiring active assist to complete general exercises. Pt completed bed mobility log roll to left side with mod assist and use of bed rails and cues for technique. Pt will continue to benefit from acute skilled PT to maximize mobility and next venue of care. Recommend d/c to SNF to focus on mobility and increasing independence.   Follow Up Recommendations  SNF     Equipment Recommendations  None recommended by PT    Recommendations for Other Services       Precautions / Restrictions Precautions Precautions: Fall Precaution Comments: risk for skin break down, wound vac Required Braces or Orthoses: Other Brace Other Brace: R BKA brace Restrictions RLE Weight Bearing: Non weight bearing    Mobility  Bed Mobility Overal bed mobility: Needs Assistance Bed Mobility: Rolling Rolling: Mod assist         General bed mobility comments: cus for hand placement, Pt rolled to left side using bed rail and mod therapist assist on bed pads. Pillow placed under right side for bed pressure relief.  Transfers                 General transfer comment: Pt declined sitting EOB and transfers due to fatigue.  Ambulation/Gait                 Stairs             Wheelchair Mobility    Modified Rankin  (Stroke Patients Only)       Balance                                            Cognition Arousal/Alertness: Awake/alert Behavior During Therapy: WFL for tasks assessed/performed Overall Cognitive Status: No family/caregiver present to determine baseline cognitive functioning Area of Impairment: Safety/judgement;Awareness;Problem solving                         Safety/Judgement: Decreased awareness of safety;Decreased awareness of deficits Awareness: Emergent Problem Solving: Difficulty sequencing;Requires verbal cues;Requires tactile cues        Exercises Amputee Exercises Quad Sets: AROM;Strengthening;Both;10 reps;Supine Hip ABduction/ADduction: AAROM;Strengthening;Both;10 reps;Supine Straight Leg Raises: AAROM;Strengthening;Both;10 reps;Supine    General Comments General comments (skin integrity, edema, etc.): Pt just finished with RN and stated she was tired and not able to do therapy today. Pt was agreeable to LE ther ex, bed mobility for pressure relief and also self care of brushing her teeth.      Pertinent Vitals/Pain Pain Assessment: Faces Faces Pain Scale: Hurts a little bit Pain Location: R Leg Pain Descriptors / Indicators: Discomfort Pain Intervention(s): Limited activity within patient's tolerance;Repositioned;Monitored during session    Home Living  Prior Function            PT Goals (current goals can now be found in the care plan section) Progress towards PT goals: Not progressing toward goals - comment    Frequency    Min 3X/week      PT Plan Current plan remains appropriate    Co-evaluation PT/OT/SLP Co-Evaluation/Treatment: Yes Reason for Co-Treatment: Complexity of the patient's impairments (multi-system involvement);For patient/therapist safety;To address functional/ADL transfers PT goals addressed during session: Strengthening/ROM;Mobility/safety with mobility OT goals  addressed during session: ADL's and self-care      AM-PAC PT "6 Clicks" Mobility   Outcome Measure  Help needed turning from your back to your side while in a flat bed without using bedrails?: A Lot Help needed moving from lying on your back to sitting on the side of a flat bed without using bedrails?: Total Help needed moving to and from a bed to a chair (including a wheelchair)?: Total Help needed standing up from a chair using your arms (e.g., wheelchair or bedside chair)?: Total Help needed to walk in hospital room?: Total Help needed climbing 3-5 steps with a railing? : Total 6 Click Score: 7    End of Session   Activity Tolerance: Patient limited by fatigue Patient left: in bed;with call bell/phone within reach Nurse Communication: Mobility status PT Visit Diagnosis: Muscle weakness (generalized) (M62.81);History of falling (Z91.81);Difficulty in walking, not elsewhere classified (R26.2);Pain Pain - Right/Left: Right Pain - part of body: Leg     Time: 1250-1313 PT Time Calculation (min) (ACUTE ONLY): 23 min  Charges:  $Therapeutic Exercise: 8-22 mins                      Lelon Mast 10/06/2020, 1:27 PM

## 2020-10-10 DIAGNOSIS — Z940 Kidney transplant status: Secondary | ICD-10-CM | POA: Diagnosis not present

## 2020-10-10 DIAGNOSIS — I10 Essential (primary) hypertension: Secondary | ICD-10-CM | POA: Diagnosis not present

## 2020-10-10 DIAGNOSIS — I739 Peripheral vascular disease, unspecified: Secondary | ICD-10-CM | POA: Diagnosis not present

## 2020-10-10 DIAGNOSIS — T879 Unspecified complications of amputation stump: Secondary | ICD-10-CM | POA: Diagnosis not present

## 2020-10-13 ENCOUNTER — Telehealth: Payer: Self-pay | Admitting: *Deleted

## 2020-10-14 ENCOUNTER — Ambulatory Visit: Payer: Medicare Other | Admitting: Neurology

## 2020-10-14 DIAGNOSIS — I739 Peripheral vascular disease, unspecified: Secondary | ICD-10-CM | POA: Diagnosis not present

## 2020-10-14 DIAGNOSIS — I10 Essential (primary) hypertension: Secondary | ICD-10-CM | POA: Diagnosis not present

## 2020-10-17 ENCOUNTER — Encounter: Payer: Self-pay | Admitting: Physician Assistant

## 2020-10-17 ENCOUNTER — Ambulatory Visit (INDEPENDENT_AMBULATORY_CARE_PROVIDER_SITE_OTHER): Payer: Medicare Other | Admitting: Physician Assistant

## 2020-10-17 VITALS — Ht 63.0 in | Wt 122.0 lb

## 2020-10-17 DIAGNOSIS — M86271 Subacute osteomyelitis, right ankle and foot: Secondary | ICD-10-CM

## 2020-10-17 NOTE — Progress Notes (Signed)
Office Visit Note   Patient: [name]           Date of Birth: 21-May-1961           MRN: [id number] Visit Date: 10/17/2020              Requested by: [name], [place],  [place] [id number] PCP: [name], MD  Chief Complaint  Patient presents with  . Right Leg - Routine Post Op    10/01/20 right BKA       HPI: Patient presents today 2-1/2 weeks status post right below-knee amputation.  She has been residing in a nursing home.  This is her first postoperative visit.  She has no complaints.  Assessment & Plan: Visit Diagnoses: No diagnosis found.  Plan: Wound is healing well.  I would expect that surgical staples will be removed next week.  She will continue to wear the shrinker against the skin.  I have instructed her nursing facility to wash daily with antibacterial soap and water  Follow-Up Instructions: No follow-ups on file.   Ortho Exam  Patient is alert, oriented, no adenopathy, well-dressed, normal affect, normal respiratory effort. Focused examination demonstrates well apposed wound edges.  Swelling is well controlled.  No erythema or cellulitis.  No wound dehiscence.  Surgical staples are in place.  Imaging: No results found.    Labs: Lab Results  Component Value Date   HGBA1C 7.2 (H) 09/26/2020   ESRSEDRATE >140 (H) 09/26/2020   REPTSTATUS 09/30/2020 FINAL 09/26/2020   GRAMSTAIN  09/26/2020    NO WBC SEEN MODERATE GRAM NEGATIVE RODS Performed at [facility], [place] [address], [place], [place] [address]    CULT  09/26/2020    ABUNDANT SERRATIA MARCESCENS MODERATE PSEUDOMONAS AERUGINOSA    LABORGA SERRATIA MARCESCENS 09/26/2020   LABORGA PSEUDOMONAS AERUGINOSA 09/26/2020     Lab Results  Component Value Date   ALBUMIN 1.3 (L) 10/06/2020   ALBUMIN 1.1 (L) 10/05/2020   ALBUMIN 1.1 (L) 10/04/2020    Lab Results  Component Value Date   MG 1.9 09/28/2020   MG 1.8 09/27/2020   No  results found for: VD25OH  No results found for: PREALBUMIN CBC EXTENDED Latest Ref Rng & Units 10/06/2020 10/05/2020 10/04/2020  WBC 4.0 - 10.5 K/uL 6.2 6.2 5.6  RBC 3.87 - 5.11 MIL/uL 2.24(L) 2.24(L) 2.28(L)  HGB 12.0 - 15.0 g/dL 7.7(L) 7.5(L) 7.7(L)  HCT 36 - 46 % 25.2(L) 25.1(L) 25.7(L)  PLT 150 - 400 K/uL 305 319 307  NEUTROABS 1.7 - 7.7 K/uL 4.7 4.9 4.3  LYMPHSABS 0.7 - 4.0 K/uL 1.0 0.9 0.8     Body mass index is 21.61 kg/m.  Orders:  No orders of the defined types were placed in this encounter.  No orders of the defined types were placed in this encounter.    Procedures: No procedures performed  Clinical Data: No additional findings.  ROS:  All other systems negative, except as noted in the HPI. Review of Systems  Objective: Vital Signs: Ht 5\' 3"  (1.6 m)   Wt 122 lb (55.3 kg)   BMI 21.61 kg/m   Specialty Comments:  No specialty comments available.  PMFS History: Patient Active Problem List   Diagnosis Date Noted  . Gangrene of right foot ([place])   . Subacute osteomyelitis of right foot ([place])   . DKA, type 1 ([place]) 09/27/2020  . Pressure injury of skin 09/27/2020  . Diabetic  ketoacidosis ([place]) 09/26/2020  . Acute kidney injury superimposed on CKD ([place]) 09/26/2020  . Cellulitis and abscess of right leg 09/26/2020  . 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 Chronic kidney disease (CKD) stage G3a/A1, moderately decreased glomerular filtration rate (GFR) between 45-59 mL/min/1.73 square meter and albuminuria creatinine ratio less than 30 mg/g ([facility]) 05/14/2020  . Hypertension 05/14/2020   Past Medical History:  Diagnosis Date  . Anemia of chronic renal failure     No family history on file.  Past Surgical History:  Procedure Laterality Date  . AMPUTATION Right 10/01/2020   Procedure: RIGHT BELOW KNEE AMPUTATION;  Surgeon: Newt Minion, MD;  Location: [facility];  Service:  Orthopedics;  Laterality: Right;  . BLADDER SURGERY    . KIDNEY TRANSPLANT    . LEG SURGERY     Social History   Occupational History  . Not on file  Tobacco Use  . Smoking status: Never Smoker  . Smokeless tobacco: Never Used  Substance and Sexual Activity  . Alcohol use: Never  . Drug use: Never  . Sexual activity: Not on file

## 2020-10-22 DIAGNOSIS — L89150 Pressure ulcer of sacral region, unstageable: Secondary | ICD-10-CM | POA: Diagnosis not present

## 2020-10-22 DIAGNOSIS — E119 Type 2 diabetes mellitus without complications: Secondary | ICD-10-CM | POA: Diagnosis not present

## 2020-10-22 DIAGNOSIS — M6281 Muscle weakness (generalized): Secondary | ICD-10-CM | POA: Diagnosis not present

## 2020-10-24 ENCOUNTER — Ambulatory Visit: Payer: Medicare Other | Admitting: Physician Assistant

## 2020-10-25 DIAGNOSIS — E1021 Type 1 diabetes mellitus with diabetic nephropathy: Secondary | ICD-10-CM | POA: Diagnosis not present

## 2020-10-27 DIAGNOSIS — I129 Hypertensive chronic kidney disease with stage 1 through stage 4 chronic kidney disease, or unspecified chronic kidney disease: Secondary | ICD-10-CM | POA: Diagnosis not present

## 2020-10-27 DIAGNOSIS — L89620 Pressure ulcer of left heel, unstageable: Secondary | ICD-10-CM | POA: Diagnosis present

## 2020-10-27 DIAGNOSIS — E1022 Type 1 diabetes mellitus with diabetic chronic kidney disease: Secondary | ICD-10-CM | POA: Diagnosis not present

## 2020-10-27 DIAGNOSIS — Z79899 Other long term (current) drug therapy: Secondary | ICD-10-CM | POA: Diagnosis not present

## 2020-10-27 DIAGNOSIS — R609 Edema, unspecified: Secondary | ICD-10-CM | POA: Diagnosis not present

## 2020-10-27 DIAGNOSIS — E872 Acidosis: Secondary | ICD-10-CM | POA: Diagnosis present

## 2020-10-27 DIAGNOSIS — T879 Unspecified complications of amputation stump: Secondary | ICD-10-CM | POA: Diagnosis not present

## 2020-10-27 DIAGNOSIS — Z89511 Acquired absence of right leg below knee: Secondary | ICD-10-CM | POA: Diagnosis not present

## 2020-10-27 DIAGNOSIS — I10 Essential (primary) hypertension: Secondary | ICD-10-CM | POA: Diagnosis not present

## 2020-10-27 DIAGNOSIS — Z940 Kidney transplant status: Secondary | ICD-10-CM | POA: Diagnosis not present

## 2020-10-27 DIAGNOSIS — T8619 Other complication of kidney transplant: Secondary | ICD-10-CM | POA: Diagnosis present

## 2020-10-27 DIAGNOSIS — D649 Anemia, unspecified: Secondary | ICD-10-CM | POA: Diagnosis not present

## 2020-10-27 DIAGNOSIS — Z7952 Long term (current) use of systemic steroids: Secondary | ICD-10-CM | POA: Diagnosis not present

## 2020-10-27 DIAGNOSIS — E8809 Other disorders of plasma-protein metabolism, not elsewhere classified: Secondary | ICD-10-CM | POA: Diagnosis present

## 2020-10-27 DIAGNOSIS — N1330 Unspecified hydronephrosis: Secondary | ICD-10-CM | POA: Diagnosis present

## 2020-10-27 DIAGNOSIS — R601 Generalized edema: Secondary | ICD-10-CM | POA: Diagnosis present

## 2020-10-27 DIAGNOSIS — E039 Hypothyroidism, unspecified: Secondary | ICD-10-CM | POA: Diagnosis present

## 2020-10-27 DIAGNOSIS — Y830 Surgical operation with transplant of whole organ as the cause of abnormal reaction of the patient, or of later complication, without mention of misadventure at the time of the procedure: Secondary | ICD-10-CM | POA: Diagnosis present

## 2020-10-27 DIAGNOSIS — Z9483 Pancreas transplant status: Secondary | ICD-10-CM | POA: Diagnosis not present

## 2020-10-27 DIAGNOSIS — Z8249 Family history of ischemic heart disease and other diseases of the circulatory system: Secondary | ICD-10-CM | POA: Diagnosis not present

## 2020-10-27 DIAGNOSIS — K2100 Gastro-esophageal reflux disease with esophagitis, without bleeding: Secondary | ICD-10-CM | POA: Diagnosis present

## 2020-10-27 DIAGNOSIS — Z20822 Contact with and (suspected) exposure to covid-19: Secondary | ICD-10-CM | POA: Diagnosis present

## 2020-10-27 DIAGNOSIS — E875 Hyperkalemia: Secondary | ICD-10-CM | POA: Diagnosis present

## 2020-10-27 DIAGNOSIS — E10621 Type 1 diabetes mellitus with foot ulcer: Secondary | ICD-10-CM | POA: Diagnosis not present

## 2020-10-27 DIAGNOSIS — F32A Depression, unspecified: Secondary | ICD-10-CM | POA: Diagnosis present

## 2020-10-27 DIAGNOSIS — L03115 Cellulitis of right lower limb: Secondary | ICD-10-CM | POA: Diagnosis not present

## 2020-10-27 DIAGNOSIS — I739 Peripheral vascular disease, unspecified: Secondary | ICD-10-CM | POA: Diagnosis not present

## 2020-10-27 DIAGNOSIS — N189 Chronic kidney disease, unspecified: Secondary | ICD-10-CM | POA: Diagnosis not present

## 2020-10-27 DIAGNOSIS — K2211 Ulcer of esophagus with bleeding: Secondary | ICD-10-CM | POA: Diagnosis not present

## 2020-10-27 DIAGNOSIS — E1010 Type 1 diabetes mellitus with ketoacidosis without coma: Secondary | ICD-10-CM | POA: Diagnosis not present

## 2020-10-27 DIAGNOSIS — Z794 Long term (current) use of insulin: Secondary | ICD-10-CM | POA: Diagnosis not present

## 2020-10-27 DIAGNOSIS — M86271 Subacute osteomyelitis, right ankle and foot: Secondary | ICD-10-CM | POA: Diagnosis not present

## 2020-10-27 DIAGNOSIS — E869 Volume depletion, unspecified: Secondary | ICD-10-CM | POA: Diagnosis present

## 2020-10-27 DIAGNOSIS — D631 Anemia in chronic kidney disease: Secondary | ICD-10-CM | POA: Diagnosis present

## 2020-10-27 DIAGNOSIS — I96 Gangrene, not elsewhere classified: Secondary | ICD-10-CM | POA: Diagnosis not present

## 2020-10-27 DIAGNOSIS — X58XXXA Exposure to other specified factors, initial encounter: Secondary | ICD-10-CM | POA: Diagnosis present

## 2020-10-27 DIAGNOSIS — R52 Pain, unspecified: Secondary | ICD-10-CM | POA: Diagnosis not present

## 2020-10-27 DIAGNOSIS — M6281 Muscle weakness (generalized): Secondary | ICD-10-CM | POA: Diagnosis not present

## 2020-10-27 DIAGNOSIS — E1065 Type 1 diabetes mellitus with hyperglycemia: Secondary | ICD-10-CM | POA: Diagnosis present

## 2020-10-27 DIAGNOSIS — N289 Disorder of kidney and ureter, unspecified: Secondary | ICD-10-CM | POA: Diagnosis not present

## 2020-10-27 DIAGNOSIS — N1831 Chronic kidney disease, stage 3a: Secondary | ICD-10-CM | POA: Diagnosis not present

## 2020-10-27 DIAGNOSIS — N139 Obstructive and reflux uropathy, unspecified: Secondary | ICD-10-CM | POA: Diagnosis not present

## 2020-10-27 DIAGNOSIS — E1110 Type 2 diabetes mellitus with ketoacidosis without coma: Secondary | ICD-10-CM | POA: Diagnosis not present

## 2020-10-27 DIAGNOSIS — Z4781 Encounter for orthopedic aftercare following surgical amputation: Secondary | ICD-10-CM | POA: Diagnosis not present

## 2020-10-27 DIAGNOSIS — L89153 Pressure ulcer of sacral region, stage 3: Secondary | ICD-10-CM | POA: Diagnosis present

## 2020-10-27 DIAGNOSIS — N179 Acute kidney failure, unspecified: Secondary | ICD-10-CM | POA: Diagnosis present

## 2020-10-27 DIAGNOSIS — E118 Type 2 diabetes mellitus with unspecified complications: Secondary | ICD-10-CM | POA: Diagnosis not present

## 2020-10-27 DIAGNOSIS — E1165 Type 2 diabetes mellitus with hyperglycemia: Secondary | ICD-10-CM | POA: Diagnosis not present

## 2020-10-27 DIAGNOSIS — Z89431 Acquired absence of right foot: Secondary | ICD-10-CM | POA: Diagnosis not present

## 2020-10-29 ENCOUNTER — Encounter (HOSPITAL_COMMUNITY): Payer: Self-pay | Admitting: Internal Medicine

## 2020-10-29 ENCOUNTER — Inpatient Hospital Stay (HOSPITAL_COMMUNITY)
Admission: EM | Admit: 2020-10-29 | Discharge: 2020-11-07 | DRG: 698 | Disposition: A | Discharge status: Skilled Nursing Facility | Payer: BC Managed Care – PPO | Attending: Internal Medicine | Admitting: Internal Medicine

## 2020-10-29 ENCOUNTER — Emergency Department (HOSPITAL_COMMUNITY): Payer: BC Managed Care – PPO

## 2020-10-29 ENCOUNTER — Inpatient Hospital Stay (HOSPITAL_COMMUNITY): Payer: BC Managed Care – PPO

## 2020-10-29 ENCOUNTER — Other Ambulatory Visit: Payer: Self-pay

## 2020-10-29 DIAGNOSIS — E1051 Type 1 diabetes mellitus with diabetic peripheral angiopathy without gangrene: Secondary | ICD-10-CM | POA: Diagnosis present

## 2020-10-29 DIAGNOSIS — N1831 Chronic kidney disease, stage 3a: Secondary | ICD-10-CM | POA: Diagnosis present

## 2020-10-29 DIAGNOSIS — F32A Depression, unspecified: Secondary | ICD-10-CM | POA: Diagnosis present

## 2020-10-29 DIAGNOSIS — M6281 Muscle weakness (generalized): Secondary | ICD-10-CM | POA: Diagnosis not present

## 2020-10-29 DIAGNOSIS — T464X5A Adverse effect of angiotensin-converting-enzyme inhibitors, initial encounter: Secondary | ICD-10-CM | POA: Diagnosis present

## 2020-10-29 DIAGNOSIS — E108 Type 1 diabetes mellitus with unspecified complications: Secondary | ICD-10-CM | POA: Diagnosis present

## 2020-10-29 DIAGNOSIS — K2100 Gastro-esophageal reflux disease with esophagitis, without bleeding: Secondary | ICD-10-CM | POA: Diagnosis present

## 2020-10-29 DIAGNOSIS — T8619 Other complication of kidney transplant: Secondary | ICD-10-CM | POA: Diagnosis not present

## 2020-10-29 DIAGNOSIS — L89153 Pressure ulcer of sacral region, stage 3: Secondary | ICD-10-CM | POA: Diagnosis not present

## 2020-10-29 DIAGNOSIS — K449 Diaphragmatic hernia without obstruction or gangrene: Secondary | ICD-10-CM | POA: Diagnosis present

## 2020-10-29 DIAGNOSIS — E1022 Type 1 diabetes mellitus with diabetic chronic kidney disease: Secondary | ICD-10-CM | POA: Diagnosis not present

## 2020-10-29 DIAGNOSIS — Z8249 Family history of ischemic heart disease and other diseases of the circulatory system: Secondary | ICD-10-CM | POA: Diagnosis not present

## 2020-10-29 DIAGNOSIS — N179 Acute kidney failure, unspecified: Secondary | ICD-10-CM | POA: Diagnosis not present

## 2020-10-29 DIAGNOSIS — Z940 Kidney transplant status: Secondary | ICD-10-CM | POA: Diagnosis not present

## 2020-10-29 DIAGNOSIS — N1330 Unspecified hydronephrosis: Secondary | ICD-10-CM | POA: Diagnosis present

## 2020-10-29 DIAGNOSIS — R1900 Intra-abdominal and pelvic swelling, mass and lump, unspecified site: Secondary | ICD-10-CM | POA: Diagnosis not present

## 2020-10-29 DIAGNOSIS — Z833 Family history of diabetes mellitus: Secondary | ICD-10-CM

## 2020-10-29 DIAGNOSIS — R52 Pain, unspecified: Secondary | ICD-10-CM | POA: Diagnosis not present

## 2020-10-29 DIAGNOSIS — R112 Nausea with vomiting, unspecified: Secondary | ICD-10-CM | POA: Diagnosis not present

## 2020-10-29 DIAGNOSIS — Z89431 Acquired absence of right foot: Secondary | ICD-10-CM | POA: Diagnosis not present

## 2020-10-29 DIAGNOSIS — Z89511 Acquired absence of right leg below knee: Secondary | ICD-10-CM

## 2020-10-29 DIAGNOSIS — E039 Hypothyroidism, unspecified: Secondary | ICD-10-CM | POA: Diagnosis not present

## 2020-10-29 DIAGNOSIS — L03115 Cellulitis of right lower limb: Secondary | ICD-10-CM | POA: Diagnosis not present

## 2020-10-29 DIAGNOSIS — X58XXXA Exposure to other specified factors, initial encounter: Secondary | ICD-10-CM | POA: Diagnosis present

## 2020-10-29 DIAGNOSIS — Y830 Surgical operation with transplant of whole organ as the cause of abnormal reaction of the patient, or of later complication, without mention of misadventure at the time of the procedure: Secondary | ICD-10-CM | POA: Diagnosis present

## 2020-10-29 DIAGNOSIS — I10 Essential (primary) hypertension: Secondary | ICD-10-CM | POA: Diagnosis present

## 2020-10-29 DIAGNOSIS — E1065 Type 1 diabetes mellitus with hyperglycemia: Secondary | ICD-10-CM | POA: Diagnosis present

## 2020-10-29 DIAGNOSIS — L89620 Pressure ulcer of left heel, unstageable: Secondary | ICD-10-CM | POA: Diagnosis not present

## 2020-10-29 DIAGNOSIS — Z741 Need for assistance with personal care: Secondary | ICD-10-CM | POA: Diagnosis not present

## 2020-10-29 DIAGNOSIS — Z79899 Other long term (current) drug therapy: Secondary | ICD-10-CM | POA: Diagnosis not present

## 2020-10-29 DIAGNOSIS — K920 Hematemesis: Secondary | ICD-10-CM | POA: Diagnosis not present

## 2020-10-29 DIAGNOSIS — Z9483 Pancreas transplant status: Secondary | ICD-10-CM

## 2020-10-29 DIAGNOSIS — E8809 Other disorders of plasma-protein metabolism, not elsewhere classified: Secondary | ICD-10-CM | POA: Diagnosis present

## 2020-10-29 DIAGNOSIS — N289 Disorder of kidney and ureter, unspecified: Secondary | ICD-10-CM | POA: Diagnosis not present

## 2020-10-29 DIAGNOSIS — Z7409 Other reduced mobility: Secondary | ICD-10-CM | POA: Diagnosis not present

## 2020-10-29 DIAGNOSIS — D649 Anemia, unspecified: Secondary | ICD-10-CM | POA: Diagnosis not present

## 2020-10-29 DIAGNOSIS — R601 Generalized edema: Secondary | ICD-10-CM | POA: Diagnosis present

## 2020-10-29 DIAGNOSIS — M86271 Subacute osteomyelitis, right ankle and foot: Secondary | ICD-10-CM | POA: Diagnosis not present

## 2020-10-29 DIAGNOSIS — I129 Hypertensive chronic kidney disease with stage 1 through stage 4 chronic kidney disease, or unspecified chronic kidney disease: Secondary | ICD-10-CM | POA: Diagnosis present

## 2020-10-29 DIAGNOSIS — K2211 Ulcer of esophagus with bleeding: Secondary | ICD-10-CM | POA: Diagnosis not present

## 2020-10-29 DIAGNOSIS — Z7952 Long term (current) use of systemic steroids: Secondary | ICD-10-CM

## 2020-10-29 DIAGNOSIS — K259 Gastric ulcer, unspecified as acute or chronic, without hemorrhage or perforation: Secondary | ICD-10-CM | POA: Diagnosis not present

## 2020-10-29 DIAGNOSIS — R54 Age-related physical debility: Secondary | ICD-10-CM | POA: Diagnosis present

## 2020-10-29 DIAGNOSIS — K208 Other esophagitis without bleeding: Secondary | ICD-10-CM | POA: Diagnosis not present

## 2020-10-29 DIAGNOSIS — E1165 Type 2 diabetes mellitus with hyperglycemia: Secondary | ICD-10-CM | POA: Diagnosis not present

## 2020-10-29 DIAGNOSIS — E869 Volume depletion, unspecified: Secondary | ICD-10-CM | POA: Diagnosis present

## 2020-10-29 DIAGNOSIS — K2289 Other specified disease of esophagus: Secondary | ICD-10-CM | POA: Diagnosis not present

## 2020-10-29 DIAGNOSIS — Z4781 Encounter for orthopedic aftercare following surgical amputation: Secondary | ICD-10-CM | POA: Diagnosis not present

## 2020-10-29 DIAGNOSIS — F339 Major depressive disorder, recurrent, unspecified: Secondary | ICD-10-CM | POA: Diagnosis not present

## 2020-10-29 DIAGNOSIS — Z9641 Presence of insulin pump (external) (internal): Secondary | ICD-10-CM | POA: Diagnosis present

## 2020-10-29 DIAGNOSIS — K2901 Acute gastritis with bleeding: Secondary | ICD-10-CM | POA: Diagnosis not present

## 2020-10-29 DIAGNOSIS — R531 Weakness: Secondary | ICD-10-CM | POA: Diagnosis not present

## 2020-10-29 DIAGNOSIS — R9431 Abnormal electrocardiogram [ECG] [EKG]: Secondary | ICD-10-CM | POA: Diagnosis not present

## 2020-10-29 DIAGNOSIS — Z20822 Contact with and (suspected) exposure to covid-19: Secondary | ICD-10-CM | POA: Diagnosis not present

## 2020-10-29 DIAGNOSIS — N189 Chronic kidney disease, unspecified: Secondary | ICD-10-CM | POA: Diagnosis present

## 2020-10-29 DIAGNOSIS — Z794 Long term (current) use of insulin: Secondary | ICD-10-CM | POA: Diagnosis not present

## 2020-10-29 DIAGNOSIS — E10621 Type 1 diabetes mellitus with foot ulcer: Secondary | ICD-10-CM | POA: Diagnosis not present

## 2020-10-29 DIAGNOSIS — E872 Acidosis: Secondary | ICD-10-CM | POA: Diagnosis not present

## 2020-10-29 DIAGNOSIS — R41841 Cognitive communication deficit: Secondary | ICD-10-CM | POA: Diagnosis not present

## 2020-10-29 DIAGNOSIS — R609 Edema, unspecified: Secondary | ICD-10-CM | POA: Diagnosis not present

## 2020-10-29 DIAGNOSIS — Z79890 Hormone replacement therapy (postmenopausal): Secondary | ICD-10-CM

## 2020-10-29 DIAGNOSIS — D631 Anemia in chronic kidney disease: Secondary | ICD-10-CM

## 2020-10-29 DIAGNOSIS — E10649 Type 1 diabetes mellitus with hypoglycemia without coma: Secondary | ICD-10-CM | POA: Diagnosis not present

## 2020-10-29 DIAGNOSIS — M2550 Pain in unspecified joint: Secondary | ICD-10-CM | POA: Diagnosis not present

## 2020-10-29 DIAGNOSIS — R10817 Generalized abdominal tenderness: Secondary | ICD-10-CM | POA: Diagnosis not present

## 2020-10-29 DIAGNOSIS — E875 Hyperkalemia: Secondary | ICD-10-CM | POA: Diagnosis present

## 2020-10-29 DIAGNOSIS — N1832 Chronic kidney disease, stage 3b: Secondary | ICD-10-CM | POA: Diagnosis present

## 2020-10-29 DIAGNOSIS — N139 Obstructive and reflux uropathy, unspecified: Secondary | ICD-10-CM | POA: Diagnosis not present

## 2020-10-29 DIAGNOSIS — Z7401 Bed confinement status: Secondary | ICD-10-CM | POA: Diagnosis not present

## 2020-10-29 DIAGNOSIS — M6259 Muscle wasting and atrophy, not elsewhere classified, multiple sites: Secondary | ICD-10-CM | POA: Diagnosis not present

## 2020-10-29 DIAGNOSIS — E1110 Type 2 diabetes mellitus with ketoacidosis without coma: Secondary | ICD-10-CM | POA: Diagnosis not present

## 2020-10-29 HISTORY — DX: Type 2 diabetes mellitus without complications: E11.9

## 2020-10-29 LAB — CBG MONITORING, ED
Glucose-Capillary: 367 mg/dL — ABNORMAL HIGH (ref 70–99)
Glucose-Capillary: 353 mg/dL — ABNORMAL HIGH (ref 70–99)

## 2020-10-29 LAB — CBC WITH DIFFERENTIAL/PLATELET
Abs Immature Granulocytes: 0.1 10*3/uL — ABNORMAL HIGH (ref 0.00–0.07)
Abs Immature Granulocytes: 0.1 10*3/uL — ABNORMAL HIGH (ref 0.00–0.07)
Abs Immature Granulocytes: 0.12 10*3/uL — ABNORMAL HIGH (ref 0.00–0.07)
Basophils Absolute: 0 10*3/uL (ref 0.0–0.1)
Basophils Absolute: 0 10*3/uL (ref 0.0–0.1)
Basophils Absolute: 0.1 10*3/uL (ref 0.0–0.1)
Basophils Relative: 1 %
Basophils Relative: 0 %
Basophils Relative: 0 %
Eosinophils Absolute: 0.2 10*3/uL (ref 0.0–0.5)
Eosinophils Absolute: 0.1 10*3/uL (ref 0.0–0.5)
Eosinophils Absolute: 0 10*3/uL (ref 0.0–0.5)
Eosinophils Relative: 2 %
Eosinophils Relative: 1 %
Eosinophils Relative: 0 %
HCT: 22.4 % — ABNORMAL LOW (ref 36.0–46.0)
HCT: 22.5 % — ABNORMAL LOW (ref 36.0–46.0)
HCT: 35.1 % — ABNORMAL LOW (ref 36.0–46.0)
Hemoglobin: 6.7 g/dL — CL (ref 12.0–15.0)
Hemoglobin: 7 g/dL — ABNORMAL LOW (ref 12.0–15.0)
Hemoglobin: 11.4 g/dL — ABNORMAL LOW (ref 12.0–15.0)
Immature Granulocytes: 1 %
Immature Granulocytes: 1 %
Lymphocytes Relative: 9 %
Lymphocytes Relative: 3 %
Lymphocytes Relative: 6 %
Lymphs Abs: 0.7 10*3/uL (ref 0.7–4.0)
Lymphs Abs: 0.2 10*3/uL — ABNORMAL LOW (ref 0.7–4.0)
Lymphs Abs: 0.7 10*3/uL (ref 0.7–4.0)
MCH: 34.9 pg — ABNORMAL HIGH (ref 26.0–34.0)
MCH: 34.7 pg — ABNORMAL HIGH (ref 26.0–34.0)
MCH: 33.2 pg (ref 26.0–34.0)
MCHC: 29.9 g/dL — ABNORMAL LOW (ref 30.0–36.0)
MCHC: 31.1 g/dL (ref 30.0–36.0)
MCHC: 32.5 g/dL (ref 30.0–36.0)
MCV: 116.7 fL — ABNORMAL HIGH (ref 80.0–100.0)
MCV: 111.4 fL — ABNORMAL HIGH (ref 80.0–100.0)
MCV: 102.3 fL — ABNORMAL HIGH (ref 80.0–100.0)
Monocytes Absolute: 0.6 10*3/uL (ref 0.1–1.0)
Monocytes Absolute: 0 10*3/uL — ABNORMAL LOW (ref 0.1–1.0)
Monocytes Absolute: 0.7 10*3/uL (ref 0.1–1.0)
Monocytes Relative: 8 %
Monocytes Relative: 0 %
Monocytes Relative: 6 %
Neutro Abs: 6.4 10*3/uL (ref 1.7–7.7)
Neutro Abs: 5.7 10*3/uL (ref 1.7–7.7)
Neutro Abs: 9.9 10*3/uL — ABNORMAL HIGH (ref 1.7–7.7)
Neutrophils Relative %: 79 %
Neutrophils Relative %: 94 %
Neutrophils Relative %: 87 %
Platelets: 291 10*3/uL (ref 150–400)
Platelets: 227 10*3/uL (ref 150–400)
Platelets: 308 10*3/uL (ref 150–400)
Promyelocytes Relative: 2 %
RBC: 1.92 MIL/uL — ABNORMAL LOW (ref 3.87–5.11)
RBC: 2.02 MIL/uL — ABNORMAL LOW (ref 3.87–5.11)
RBC: 3.43 MIL/uL — ABNORMAL LOW (ref 3.87–5.11)
RDW: 18.6 % — ABNORMAL HIGH (ref 11.5–15.5)
RDW: 22 % — ABNORMAL HIGH (ref 11.5–15.5)
RDW: 22 % — ABNORMAL HIGH (ref 11.5–15.5)
WBC: 8.1 10*3/uL (ref 4.0–10.5)
WBC: 6.1 10*3/uL (ref 4.0–10.5)
WBC: 11.4 10*3/uL — ABNORMAL HIGH (ref 4.0–10.5)
nRBC: 0.4 % — ABNORMAL HIGH (ref 0.0–0.2)
nRBC: 0 /100 WBC
nRBC: 0.3 % — ABNORMAL HIGH (ref 0.0–0.2)
nRBC: 0.3 % — ABNORMAL HIGH (ref 0.0–0.2)

## 2020-10-29 LAB — BASIC METABOLIC PANEL
Anion gap: 12 (ref 5–15)
BUN: 112 mg/dL — ABNORMAL HIGH (ref 6–20)
CO2: 23 mmol/L (ref 22–32)
Calcium: 8.6 mg/dL — ABNORMAL LOW (ref 8.9–10.3)
Chloride: 102 mmol/L (ref 98–111)
Creatinine, Ser: 3.05 mg/dL — ABNORMAL HIGH (ref 0.44–1.00)
GFR, Estimated: 17 mL/min — ABNORMAL LOW (ref >60)
Glucose, Bld: 392 mg/dL — ABNORMAL HIGH (ref 70–99)
Potassium: 5.7 mmol/L — ABNORMAL HIGH (ref 3.5–5.1)
Sodium: 137 mmol/L (ref 135–145)

## 2020-10-29 LAB — RESPIRATORY PANEL BY RT PCR (FLU A&B, COVID)
Influenza A by PCR: NEGATIVE
Influenza B by PCR: NEGATIVE
SARS Coronavirus 2 by RT PCR: NEGATIVE

## 2020-10-29 LAB — FERRITIN: Ferritin: 1032 ng/mL — ABNORMAL HIGH (ref 11–307)

## 2020-10-29 LAB — PHOSPHORUS: Phosphorus: 7.3 mg/dL — ABNORMAL HIGH (ref 2.5–4.6)

## 2020-10-29 LAB — IRON AND TIBC
Iron: 95 ug/dL (ref 28–170)
Saturation Ratios: 67 % — ABNORMAL HIGH (ref 10.4–31.8)
TIBC: 143 ug/dL — ABNORMAL LOW (ref 250–450)
UIBC: 48 ug/dL

## 2020-10-29 LAB — GLUCOSE, CAPILLARY: Glucose-Capillary: 273 mg/dL — ABNORMAL HIGH (ref 70–99)

## 2020-10-29 LAB — PREPARE RBC (CROSSMATCH)

## 2020-10-29 LAB — ALBUMIN: Albumin: 1.6 g/dL — ABNORMAL LOW (ref 3.5–5.0)

## 2020-10-29 MED ORDER — SODIUM BICARBONATE 650 MG PO TABS
650.0000 mg | ORAL_TABLET | Freq: Two times a day (BID) | ORAL | Status: DC
  Administered 2020-10-29 – 2020-11-07 (×18): 650 mg via ORAL
  Filled 2020-10-29 (×19): qty 1

## 2020-10-29 MED ORDER — CYCLOSPORINE 25 MG PO CAPS
75.0000 mg | ORAL_CAPSULE | Freq: Two times a day (BID) | ORAL | Status: DC
  Administered 2020-10-29: 75 mg via ORAL
  Filled 2020-10-29 (×3): qty 3

## 2020-10-29 MED ORDER — SERTRALINE HCL 50 MG PO TABS
25.0000 mg | ORAL_TABLET | Freq: Every day | ORAL | Status: DC
  Administered 2020-10-30 – 2020-11-07 (×9): 25 mg via ORAL
  Filled 2020-10-29 (×9): qty 1

## 2020-10-29 MED ORDER — ONDANSETRON HCL 4 MG/2ML IJ SOLN
4.0000 mg | Freq: Four times a day (QID) | INTRAMUSCULAR | Status: DC | PRN
  Administered 2020-11-06: 4 mg via INTRAVENOUS
  Filled 2020-10-29: qty 2

## 2020-10-29 MED ORDER — AZATHIOPRINE 50 MG PO TABS
50.0000 mg | ORAL_TABLET | Freq: Every day | ORAL | Status: DC
  Administered 2020-10-30: 50 mg via ORAL
  Filled 2020-10-29: qty 1

## 2020-10-29 MED ORDER — INSULIN GLARGINE 100 UNIT/ML ~~LOC~~ SOLN
18.0000 [IU] | Freq: Every day | SUBCUTANEOUS | Status: DC
  Administered 2020-10-29 – 2020-10-31 (×3): 18 [IU] via SUBCUTANEOUS
  Filled 2020-10-29 (×6): qty 0.18

## 2020-10-29 MED ORDER — PREDNISONE 5 MG PO TABS
5.0000 mg | ORAL_TABLET | Freq: Every day | ORAL | Status: DC
  Administered 2020-10-30 – 2020-11-07 (×9): 5 mg via ORAL
  Filled 2020-10-29 (×9): qty 1

## 2020-10-29 MED ORDER — SODIUM CHLORIDE 0.9 % IV SOLN: Freq: Once | INTRAVENOUS | Status: AC

## 2020-10-29 MED ORDER — ACETAMINOPHEN 325 MG PO TABS: 650.0000 mg | ORAL_TABLET | Freq: Four times a day (QID) | ORAL | Status: DC | PRN

## 2020-10-29 MED ORDER — SODIUM CHLORIDE 0.9 % IV SOLN
10.0000 mL/h | Freq: Once | INTRAVENOUS | Status: AC
  Administered 2020-10-29: 10 mL/h via INTRAVENOUS

## 2020-10-29 MED ORDER — INSULIN ASPART 100 UNIT/ML ~~LOC~~ SOLN
0.0000 [IU] | Freq: Three times a day (TID) | SUBCUTANEOUS | Status: DC
  Administered 2020-10-29: 15 [IU] via SUBCUTANEOUS
  Administered 2020-10-30: 2 [IU] via SUBCUTANEOUS
  Administered 2020-10-30: 5 [IU] via SUBCUTANEOUS
  Administered 2020-10-31: 11 [IU] via SUBCUTANEOUS
  Administered 2020-11-01: 2 [IU] via SUBCUTANEOUS

## 2020-10-29 MED ORDER — METOPROLOL TARTRATE 12.5 MG HALF TABLET
12.5000 mg | ORAL_TABLET | Freq: Two times a day (BID) | ORAL | Status: DC
  Administered 2020-10-29 – 2020-11-07 (×18): 12.5 mg via ORAL
  Filled 2020-10-29 (×18): qty 1

## 2020-10-29 MED ORDER — SODIUM ZIRCONIUM CYCLOSILICATE 10 G PO PACK
10.0000 g | PACK | Freq: Once | ORAL | Status: AC
  Administered 2020-10-29: 10 g via ORAL
  Filled 2020-10-29: qty 1

## 2020-10-29 MED ORDER — ATORVASTATIN CALCIUM 10 MG PO TABS
20.0000 mg | ORAL_TABLET | Freq: Every day | ORAL | Status: DC
  Administered 2020-10-29: 20 mg via ORAL
  Filled 2020-10-29: qty 2

## 2020-10-29 MED ORDER — CYCLOSPORINE 25 MG PO CAPS: 75.0000 mg | ORAL_CAPSULE | Freq: Two times a day (BID) | ORAL | Status: DC

## 2020-10-29 MED ORDER — LEVOTHYROXINE SODIUM 50 MCG PO TABS
50.0000 ug | ORAL_TABLET | Freq: Every day | ORAL | Status: DC
  Administered 2020-10-30 – 2020-11-07 (×9): 50 ug via ORAL
  Filled 2020-10-29 (×10): qty 1

## 2020-10-29 MED ORDER — ONDANSETRON HCL 4 MG PO TABS
4.0000 mg | ORAL_TABLET | Freq: Four times a day (QID) | ORAL | Status: DC | PRN
  Administered 2020-11-04: 4 mg via ORAL
  Filled 2020-10-29: qty 1

## 2020-10-29 MED ORDER — JUVEN PO PACK
1.0000 | PACK | Freq: Two times a day (BID) | ORAL | Status: DC
  Administered 2020-10-30: 1 via ORAL
  Filled 2020-10-29 (×7): qty 1

## 2020-10-29 MED ORDER — HYDROCODONE-ACETAMINOPHEN 5-325 MG PO TABS
1.0000 | ORAL_TABLET | ORAL | Status: DC | PRN
  Administered 2020-10-29 – 2020-11-07 (×29): 1 via ORAL
  Filled 2020-10-29 (×29): qty 1

## 2020-10-29 NOTE — ED Provider Notes (Signed)
[facility] EMERGENCY DEPARTMENT Provider Note   CSN: [id number] Arrival date & time: 10/29/20  1024     History Chief Complaint  Patient presents with  . Abnormal Labs  . Edema    [name] is a 59 y.o. female.  Patient sent here from rehab center due to the following lab abnormalities Cr: 3.18, K 5.9, Hb: 6.5.  Patient had lab work drawn it appears on the first.  Feels as if her mouth is dry.  She has not had any particular GI losses.  Has a history of renal transplant about 10 years ago and on cyclosporine and prednisone.  She has felt a little bit edematous over the last several days.  Had recent surgery to her foot.  Not currently on antibiotics.  Denies any black stools or bloody stools.  The history is provided by the patient and the EMS personnel.  Illness Severity:  Mild Onset quality:  Gradual Timing:  Constant Progression:  Unchanged Chronicity:  New Context:  Patient sent for further evaluation given anemia, elevated potassium, elevated creatinine Relieved by:  Nothing Worsened by:  Nothing Associated symptoms: fatigue   Associated symptoms: no abdominal pain, no chest pain, no cough, no ear pain, no fever, no rash, no shortness of breath, no sore throat and no vomiting        Past Medical History:  Diagnosis Date  . Anemia of chronic renal failure     Patient Active Problem List   Diagnosis Date Noted  . Gangrene of right foot ([place])   . Subacute osteomyelitis of right foot ([place])   . DKA, type 1 ([place]) 09/27/2020  . Pressure injury of skin 09/27/2020  . Diabetic ketoacidosis ([place]) 09/26/2020  . Acute kidney injury superimposed on CKD ([place]) 09/26/2020  . Cellulitis and abscess of right leg 09/26/2020  . 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 History of simultaneous kidney and pancreas transplant ([place]) 05/14/2020  . Chronic kidney disease (CKD) stage G3a/A1, moderately decreased glomerular filtration rate (GFR) between 45-59  mL/min/1.73 square meter and albuminuria creatinine ratio less than 30 mg/g ([facility]) 05/14/2020  . Hypertension 05/14/2020    Past Surgical History:  Procedure Laterality Date  . AMPUTATION Right 10/01/2020   Procedure: RIGHT BELOW KNEE AMPUTATION;  Surgeon: Newt Minion, MD;  Location: [facility];  Service: Orthopedics;  Laterality: Right;  . BLADDER SURGERY    . KIDNEY TRANSPLANT    . LEG SURGERY       OB History   No obstetric history on file.     No family history on file.  Social History   Tobacco Use  . Smoking status: Never Smoker  . Smokeless tobacco: Never Used  Substance Use Topics  . Alcohol use: Never  . Drug use: Never    Home Medications Prior to Admission medications   Medication Sig Start Date End Date Taking? Authorizing Provider  acetaminophen (TYLENOL) 325 MG tablet Take 2 tablets (650 mg total) by mouth every 6 (six) hours as needed for mild pain (or Fever >/= 101). Patient taking differently: Take 650 mg by mouth every 6 (six) hours as needed for mild pain or fever.  10/06/20  Yes Nita Sells, MD  atorvastatin (LIPITOR) 20 MG tablet Take 20 mg by mouth at bedtime.  08/28/20  Yes [provider]  azaTHIOprine (IMURAN) 50 MG tablet Take 50 mg by mouth daily. 08/23/20  Yes [provider]  cycloSPORINE (SANDIMMUNE) 25 MG capsule Take 75 mg by  mouth 2 (two) times daily.   Yes [provider]  HYDROcodone-acetaminophen (NORCO/VICODIN) 5-325 MG tablet Take 1 tablet by mouth every 4 (four) hours as needed for moderate pain. 10/06/20  Yes Nita Sells, MD  insulin glargine (SEMGLEE) 100 UNIT/ML injection Inject 24 Units into the skin at bedtime.   Yes [provider]  insulin lispro (ADMELOG) 100 UNIT/ML injection Inject 2 Units into the skin 3 (three) times daily before meals.   Yes [provider]  levothyroxine (SYNTHROID) 50 MCG tablet Take 50 mcg by mouth daily. 08/23/20  Yes [provider]    metoprolol tartrate (LOPRESSOR) 25 MG tablet Take 12.5 mg by mouth 2 (two) times daily.  09/15/20  Yes [provider]  nutrition supplement, JUVEN, (JUVEN) PACK Take 1 packet by mouth 2 (two) times daily between meals. 10/06/20  Yes Nita Sells, MD  predniSONE (DELTASONE) 5 MG tablet Take 5 mg by mouth daily. 07/13/20  Yes [provider]  sertraline (ZOLOFT) 25 MG tablet Take 1 tablet (25 mg total) by mouth daily. 10/06/20  Yes Nita Sells, MD  sodium bicarbonate 650 MG tablet Take 1 tablet (650 mg total) by mouth 2 (two) times daily. 10/06/20  Yes Nita Sells, MD  insulin aspart (NOVOLOG) 100 UNIT/ML injection Inject 2 Units into the skin 3 (three) times daily with meals. Patient not taking: Reported on 10/29/2020 10/06/20   Nita Sells, MD  insulin detemir (LEVEMIR) 100 UNIT/ML injection Inject 0.24 mLs (24 Units total) into the skin daily. Patient not taking: Reported on 10/29/2020 10/06/20   Nita Sells, MD    Allergies    Patient has no known allergies.  Review of Systems   Review of Systems  Constitutional: Positive for fatigue. Negative for chills and fever.  HENT: Negative for ear pain and sore throat.   Eyes: Negative for pain and visual disturbance.  Respiratory: Negative for cough and shortness of breath.   Cardiovascular: Positive for leg swelling. Negative for chest pain and palpitations.  Gastrointestinal: Negative for abdominal pain and vomiting.  Genitourinary: Negative for dysuria and hematuria.  Musculoskeletal: Negative for arthralgias and back pain.  Skin: Negative for color change and rash.  Neurological: Negative for seizures and syncope.  All other systems reviewed and are negative.   Physical Exam Updated Vital Signs BP (!) 149/71   Pulse 71   Resp 13   SpO2 96%   Physical Exam Vitals and nursing note reviewed.  Constitutional:      General: She is not in acute distress.    Appearance: She  is well-developed. She is not ill-appearing.  HENT:     Head: Normocephalic and atraumatic.     Nose: Nose normal.     Mouth/Throat:     Mouth: Mucous membranes are moist.     Pharynx: No oropharyngeal exudate.  Eyes:     Extraocular Movements: Extraocular movements intact.     Conjunctiva/sclera: Conjunctivae normal.     Pupils: Pupils are equal, round, and reactive to light.     Comments: Pale conjuctiva   Cardiovascular:     Rate and Rhythm: Normal rate and regular rhythm.     Pulses: Normal pulses.     Heart sounds: Normal heart sounds. No murmur heard.   Pulmonary:     Effort: Pulmonary effort is normal. No respiratory distress.     Breath sounds: Normal breath sounds.  Abdominal:     General: Abdomen is flat.     Palpations: Abdomen is soft.  Tenderness: There is no abdominal tenderness.  Musculoskeletal:     Cervical back: Neck supple.     Right lower leg: Edema present.     Left lower leg: Edema present.  Skin:    General: Skin is warm and dry.     Capillary Refill: Capillary refill takes less than 2 seconds.     Coloration: Skin is pale.     Comments: Well-healing surgical scar to her right foot  Neurological:     General: No focal deficit present.     Mental Status: She is alert.  Psychiatric:        Mood and Affect: Mood normal.     ED Results / Procedures / Treatments   Labs (all labs ordered are listed, but only abnormal results are displayed) Labs Reviewed  CBC WITH DIFFERENTIAL/PLATELET - Abnormal; Notable for the following components:      Result Value   RBC 1.92 (*)    Hemoglobin 6.7 (*)    HCT 22.4 (*)    MCV 116.7 (*)    MCH 34.9 (*)    MCHC 29.9 (*)    RDW 18.6 (*)    nRBC 0.4 (*)    Abs Immature Granulocytes 0.10 (*)    All other components within normal limits  BASIC METABOLIC PANEL - Abnormal; Notable for the following components:   Potassium 5.7 (*)    Glucose, Bld 392 (*)    BUN 112 (*)    Creatinine, Ser 3.05 (*)    Calcium  8.6 (*)    GFR, Estimated 17 (*)    All other components within normal limits  RESPIRATORY PANEL BY RT PCR (FLU A&B, COVID)  URINALYSIS, ROUTINE W REFLEX MICROSCOPIC  FERRITIN  IRON AND TIBC  CBG MONITORING, ED  TYPE AND SCREEN  PREPARE RBC (CROSSMATCH)    EKG EKG Interpretation  Date/Time:  Wednesday October 29 2020 10:47:35 EDT Ventricular Rate:  75 PR Interval:    QRS Duration: 108 QT Interval:  382 QTC Calculation: 427 R Axis:   -110 Text Interpretation: Sinus rhythm Anterior infarct, old Confirmed by Lennice Sites [id number]) on 10/29/2020 11:11:20 AM   Radiology No results found.  Procedures .Critical Care Performed by: Lennice Sites, DO Authorized by: Lennice Sites, DO   Critical care provider statement:    Critical care time (minutes):  45   Critical care was necessary to treat or prevent imminent or life-threatening deterioration of the following conditions:  Circulatory failure and renal failure   Critical care was time spent personally by me on the following activities:  Blood draw for specimens, development of treatment plan with patient or surrogate, discussions with consultants, discussions with primary provider, evaluation of patient's response to treatment, examination of patient, obtaining history from patient or surrogate, ordering and performing treatments and interventions, ordering and review of laboratory studies, ordering and review of radiographic studies, pulse oximetry and re-evaluation of patient's condition   I assumed direction of critical care for this patient from another provider in my specialty: no     (including critical care time)  Medications Ordered in ED Medications  0.9 %  sodium chloride infusion (has no administration in time range)  sodium zirconium cyclosilicate (LOKELMA) packet 10 g (10 g Oral Given 10/29/20 1230)  0.9 %  sodium chloride infusion ( Intravenous New Bag/Given (Non-Interop) 10/29/20 1230)    ED Course  I have  reviewed the triage vital signs and the nursing notes.  Pertinent labs & imaging results that were available  during my care of the patient were reviewed by me and considered in my medical decision making (see chart for details).    MDM Rules/Calculators/A&P                          Jaena Brocato is a 60 year old female with history of chronic anemia, renal failure status post kidney transplant on immunosuppressants who presents to the ED with abnormal labs from rehab facility.  Recent surgery to her right foot has been in rehab.  They sent her because she has had elevated creatinine at 3.18, potassium of 5.9 and hemoglobin of 6.4.  Patient denies any black stools or bloody stools.  She refuses rectal exam.  States that she has needed iron in the past.  Creatinine upon chart review was around 1.2 several weeks ago.  Mucous membranes are dry.  She appears pale.  Energy level has been low and she has noticed some increased swelling in her legs.  She denies any pain.  We will recheck lab work and touch base with nephrology and anticipate admission.  Hemoglobin continues to be low at 6.7.  Creatinine continues to be elevated at 3.05.  Blood sugars 392 bicarb is 23 and anion gap is 12.  Potassium mildly elevated at 5.7 with no hyperkalemic changes on EKG.  Was given a dose of Lokelma.  Will transfuse 1 unit of packed red blood cells.  Nephrology has evaluated the patient and will get a renal ultrasound.  To be admitted to medicine for further care.  This chart was dictated using voice recognition software.  Despite best efforts to proofread,  errors can occur which can change the documentation meaning.   Final Clinical Impression(s) / ED Diagnoses Final diagnoses:  AKI (acute kidney injury) ([place])  Symptomatic anemia  Hyperkalemia    Rx / DC Orders ED Discharge Orders    None       Lennice Sites, DO 10/29/20 1313

## 2020-10-29 NOTE — ED Triage Notes (Signed)
Pt BIB [facility] d/t abnormal labs noted by the facility she lives in she also has generalized edema.

## 2020-10-29 NOTE — Consult Note (Signed)
Nephrology Consult   Requesting provider: Lennice Sites Service requesting consult: ER Reason for consult: AKI, anemia, h/o kidney transplant   Assessment/Recommendations: [name] is a/an 59 y.o. 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 -Obtain urinalysis, UPC, renal transplant ultrasound -Management of hypoalbuminemia, anasarca, intravascular volume depletion as below -Continue to monitor daily Cr, Dose meds for GFR -Monitor Daily I/Os, Daily weight  -Maintain MAP>65 for optimal renal perfusion.  -Avoid nephrotoxic medications including NSAIDs and Vanc/Zosyn combo -Currently no indication for HD  Diffuse Anasarca/hypoalbuminemia: Diffuse anasarca present on exam with pitting edema all over the body.  Recent hospitalization albumin was as low as 1.  Historically, no signs of proteinuria.  This is most likely secondary to severe illness secondary to chronic infections.  Nutrition status also likely not helping. -Add on serum albumin -Blood products as below, likely will help -Encourage dietary consult -Likely plan for IV albumin tomorrow based on results of serum albumin  Hypertension: Blood pressure is elevated at this time.  Would hold the patient's home lisinopril.  Other blood pressure medications okay  Hyperkalemia: Likely associated with renal failure as well as cyclosporine use.  Status post Lokelma in the ER.  Continue to monitor potassium daily.  Anemia due to CKD/chronic inflammation: Severe anemia likely secondary to CKD and chronic inflammation as indicated by low albumin.  Will obtain iron studies and transfuse -Transfuse 2 units PRBCs -Obtain ferritin and iron panel -Transfuse for Hgb<7 g/dL -Was ordered Retacrit outpatient but did not  receive.  Will dose Aranesp if needed  Uncontrolled Diabetes Mellitus Type 1 with hyperglycemia: Historically goes into DKA.  Has not received insulin today.  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  Continue management per primary team  History of kidney/pancreas transplant: Remote.  Follows with Dr. Cira Servant.  Baseline creatinine around 1.3.  Continue home cyclosporine and prednisone.  Plan for cyclosporine trough in a couple days.  Chronic metabolic acidosis: Associated with kidney disease.  Can continue home sodium bicarbonate.   Recommendations conveyed to primary service.    [facility] 10/29/2020 1:21 PM   _____________________________________________________________________________________ CC: AKI on CKD, anemia, history of renal transplant  History of Present Illness: [name] is a/an 59 y.o. 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  Patient was recently hospitalized from 10/1 until 10/11.  Since she was found to be in DKA on admission she was found to be in DKA.  This was treated medically and improved quickly.  She also had purulent cellulitis with a diabetic foot wound on her right foot.  She underwent amputation on 10/6 and because of clean margins antibiotics were not needed further.  She did have an AKI during the hospitalization with a creatinine that peaked at 3.6 and improved down to 1.3 prior to discharge.  She followed up with orthopedics on 10/17/2020 and it was felt that her wound was healing.  Patient has been staying at a rehab center.  She was sent to the emergency department today due to creatinine  elevation to 3.2, potassium 5.9, hemoglobin 6.5.  The patient states that over the past few days she has noted significant worsening edema.  She denies any diarrhea, nausea, vomiting.  She does have some abdominal pain with swelling in that area.  She denies any fevers or chills.  It appears that she has been scheduled to start erythropoietin in the outpatient setting but this has not occurred yet.  In the emergency department she was noted to be hemodynamically stable.  She was started on some IV fluids at 100 cc/h.  Labs demonstrated potassium of 5.7, glucose 392, creatinine 3, hemoglobin 6.7.   Medications:  Current Facility-Administered Medications  Medication Dose Route Frequency Provider Last Rate Last Admin  . 0.9 %  sodium chloride infusion  10 mL/hr Intravenous Once Lennice Sites, DO       Current Outpatient Medications  Medication Sig Dispense Refill  . acetaminophen (TYLENOL) 325 MG tablet Take 2 tablets (650 mg total) by mouth every 6 (six) hours as needed for mild pain (or Fever >/= 101). (Patient taking differently: Take 650 mg by mouth every 6 (six) hours as needed for mild pain or fever. )    . atorvastatin (LIPITOR) 20 MG tablet Take 20 mg by mouth at bedtime.     Marland Kitchen azaTHIOprine (IMURAN) 50 MG tablet Take 50 mg by mouth daily.    . cycloSPORINE (SANDIMMUNE) 25 MG capsule Take 75 mg by mouth 2 (two) times daily.    Marland Kitchen HYDROcodone-acetaminophen (NORCO/VICODIN) 5-325 MG tablet Take 1 tablet by mouth every 4 (four) hours as needed for moderate pain. 6 tablet 0  . insulin glargine (SEMGLEE) 100 UNIT/ML injection Inject 24 Units into the skin at bedtime.    . insulin lispro (ADMELOG) 100 UNIT/ML injection Inject 2 Units into the skin 3 (three) times daily before meals.    Marland Kitchen levothyroxine (SYNTHROID) 50 MCG tablet Take 50 mcg by mouth daily.    . metoprolol tartrate (LOPRESSOR) 25 MG tablet Take 12.5 mg by mouth 2 (two) times daily.     . nutrition supplement, JUVEN, (JUVEN) PACK Take  1 packet by mouth 2 (two) times daily between meals.  0  . predniSONE (DELTASONE) 5 MG tablet Take 5 mg by mouth daily.    . sertraline (ZOLOFT) 25 MG tablet Take 1 tablet (25 mg total) by mouth daily. 4 tablet 0  . sodium bicarbonate 650 MG tablet Take 1 tablet (650 mg total) by mouth 2 (two) times daily. 30 tablet 00  . insulin aspart (NOVOLOG) 100 UNIT/ML injection Inject 2 Units into the skin 3 (three) times daily with meals. (Patient not taking: Reported on 10/29/2020) 10 mL 11  . insulin detemir (LEVEMIR) 100 UNIT/ML injection Inject 0.24 mLs (24 Units total) into the skin daily. (Patient not taking: Reported on 10/29/2020) 10 mL 11     ALLERGIES Patient has no known allergies.  MEDICAL HISTORY Past Medical History:  Diagnosis Date  . Anemia of chronic renal failure      SOCIAL HISTORY Social History   Socioeconomic History  . Marital status: Married    Spouse name: Not on file  . Number of children: Not on file  . Years of education: Not on file  . Highest education level: Not on file  Occupational History  . Not on file  Tobacco Use  . Smoking status: Never Smoker  . Smokeless tobacco: Never Used  Substance and Sexual Activity  .  Alcohol use: Never  . Drug use: Never  . Sexual activity: Not on file  Other Topics Concern  . Not on file  Social History Narrative  . Not on file   Social Determinants of Health   Financial Resource Strain:   . Difficulty of Paying Living Expenses: Not on file  Food Insecurity:   . Worried About Charity fundraiser in the Last Year: Not on file  . Ran Out of Food in the Last Year: Not on file  Transportation Needs:   . Lack of Transportation (Medical): Not on file  . Lack of Transportation (Non-Medical): Not on file  Physical Activity:   . Days of Exercise per Week: Not on file  . Minutes of Exercise per Session: Not on file  Stress:   . Feeling of Stress : Not on file  Social Connections:   . Frequency of Communication with  Friends and Family: Not on file  . Frequency of Social Gatherings with Friends and Family: Not on file  . Attends Religious Services: Not on file  . Active Member of Clubs or Organizations: Not on file  . Attends Archivist Meetings: Not on file  . Marital Status: Not on file  Intimate Partner Violence:   . Fear of Current or Ex-Partner: Not on file  . Emotionally Abused: Not on file  . Physically Abused: Not on file  . Sexually Abused: Not on file     FAMILY HISTORY Diabetes in father  Review of Systems: 12 systems reviewed Otherwise as per HPI, all other systems reviewed and negative  Physical Exam: Vitals:   10/29/20 1230 10/29/20 1300  BP: (!) 146/74 (!) 149/71  Pulse: 71 71  Resp: 14 13  SpO2: 97% 96%   No intake/output data recorded. No intake or output data in the 24 hours ending 10/29/20 1321 General: No acute distress, lying in bed HEENT: anicteric sclera, oropharynx clear without lesions CV: Normal rate, no murmurs, extensive anasarca that is worse in dependent areas Lungs: clear to auscultation bilaterally, normal work of breathing Abd: Nondistended abdomen but 2+ pitting edema on the abdomen Skin: Skin thinning with intermittent breaks and tears, no other rashes Psych: alert, engaged, appropriate mood and affect Musculoskeletal: Right lower extremity absent below the knee, no joint effusions Neuro: normal speech, no gross focal deficits   Test Results Reviewed Lab Results  Component Value Date   NA 137 10/29/2020   K 5.7 (H) 10/29/2020   CL 102 10/29/2020   CO2 23 10/29/2020   BUN 112 (H) 10/29/2020   CREATININE 3.05 (H) 10/29/2020   CALCIUM 8.6 (L) 10/29/2020   ALBUMIN 1.3 (L) 10/06/2020   PHOS 3.2 09/28/2020     I have reviewed all relevant outside healthcare records related to the patient's current hospitalization

## 2020-10-29 NOTE — ED Notes (Signed)
Per MD Agbata pt only needs 1 more unit of blood. Pt has already received 1 unit which means pt would receive 2 total units.

## 2020-10-29 NOTE — H&P (Addendum)
History and Physical    [name] GHW:[id number] DOB: 12-09-1961 DOA: 10/29/2020  PCP: [name], MD   Patient coming from: Subacute rehab I have personally briefly reviewed patient's old medical records in [place]  Chief Complaint: Abnormal Labs                               Edema     HPI: [name] is a 59 y.o. female with medical history significant for DM, S/P renal and pancreas transplant about 10 years ago, Stage 3 CKD, HTN, s/p Rt BKA for osteomyelitis who presents to the emergency room from the subacute rehab center for evaluation of abnormal lab values.  Patient noted to have serum creatinine of 3.8 above her baseline of 1.27 from 3 weeks ago, hemoglobin of 6.5g/dl compared to her baseline of 7.7g/dl 3 weeks ago. Her stools are brown in color. She denies having any hematemesis, hematochezia or melena stools. She denies any NSAID use.  She denies having any nausea, no vomiting, no abdominal pain, no diarrhea, no urinary symptoms. She complains of feeling weak, bloated and fatigued. Labs show sodium 137, potassium 3.7, chloride 102, bicarb 23, glucose 392, BUN 112, creatinine 3.05, calcium 8.6, white count 8.1, hemoglobin 6.7, hematocrit 22.4, MCV 116, RDW 18.6, platelet count 291 Respiratory viral panel is negative Twelve-lead EKG reviewed by me shows sinus rhythm   ED Course: Patient is a 59 year old female with a history of diabetes mellitus, status post renal and pancreatic transplant, chronic kidney disease stage III and recent right BKA for osteomyelitis who was sent to the emergency room for evaluation of abnormal labs.  Patient noted to have worsening of her renal function with serum creatinine today on admission 3.05 compared to baseline of 1.27, potassium of 5.7, hemoglobin of 6.7 compared to baseline of 7.7 and serum glucose of 392.  Patient was treated with [facility] in the emergency room and will be admitted to the hospital for further  evaluation.   Review of Systems: As per HPI otherwise 10 point review of systems negative.    Past Medical History:  Diagnosis Date  . Anemia of chronic renal failure     Past Surgical History:  Procedure Laterality Date  . AMPUTATION Right 10/01/2020   Procedure: RIGHT BELOW KNEE AMPUTATION;  Surgeon: Newt Minion, MD;  Location: [place];  Service: Orthopedics;  Laterality: Right;  . BLADDER SURGERY    . KIDNEY TRANSPLANT    . LEG SURGERY       reports that she has never smoked. She has never used smokeless tobacco. She reports that she does not drink alcohol and does not use drugs.  No Known Allergies  Family History  Problem Relation Age of Onset  . Hypertension Mother   . Hypertension Father      Prior to Admission medications   Medication Sig Start Date End Date Taking? Authorizing Provider  acetaminophen (TYLENOL) 325 MG tablet Take 2 tablets (650 mg total) by mouth every 6 (six) hours as needed for mild pain (or Fever >/= 101). Patient taking differently: Take 650 mg by mouth every 6 (six) hours as needed for mild pain or fever.  10/06/20  Yes Nita Sells, MD  atorvastatin (LIPITOR) 20 MG tablet Take 20 mg by mouth at bedtime.  08/28/20  Yes [provider]  azaTHIOprine (IMURAN) 50 MG tablet Take 50 mg by mouth daily. 08/23/20  Yes [provider]  cycloSPORINE (SANDIMMUNE) 25 MG capsule Take 75 mg by mouth 2 (two) times daily.   Yes [provider]  HYDROcodone-acetaminophen (NORCO/VICODIN) 5-325 MG tablet Take 1 tablet by mouth every 4 (four) hours as needed for moderate pain. 10/06/20  Yes Nita Sells, MD  insulin glargine (SEMGLEE) 100 UNIT/ML injection Inject 24 Units into the skin at bedtime.   Yes [provider]  insulin lispro (ADMELOG) 100 UNIT/ML injection Inject 2 Units into the skin 3 (three) times daily before meals.   Yes [provider]  levothyroxine (SYNTHROID) 50 MCG tablet Take 50 mcg by  mouth daily. 08/23/20  Yes [provider]  metoprolol tartrate (LOPRESSOR) 25 MG tablet Take 12.5 mg by mouth 2 (two) times daily.  09/15/20  Yes [provider]  nutrition supplement, JUVEN, (JUVEN) PACK Take 1 packet by mouth 2 (two) times daily between meals. 10/06/20  Yes Nita Sells, MD  predniSONE (DELTASONE) 5 MG tablet Take 5 mg by mouth daily. 07/13/20  Yes [provider]  sertraline (ZOLOFT) 25 MG tablet Take 1 tablet (25 mg total) by mouth daily. 10/06/20  Yes Nita Sells, MD  sodium bicarbonate 650 MG tablet Take 1 tablet (650 mg total) by mouth 2 (two) times daily. 10/06/20  Yes Nita Sells, MD  insulin aspart (NOVOLOG) 100 UNIT/ML injection Inject 2 Units into the skin 3 (three) times daily with meals. Patient not taking: Reported on 10/29/2020 10/06/20   Nita Sells, MD  insulin detemir (LEVEMIR) 100 UNIT/ML injection Inject 0.24 mLs (24 Units total) into the skin daily. Patient not taking: Reported on 10/29/2020 10/06/20   Nita Sells, MD    Physical Exam: Vitals:   10/29/20 1334 10/29/20 1348 10/29/20 1400 10/29/20 1415  BP: (!) 142/72 (!) 152/75 (!) 157/77 (!) 158/79  Pulse: 70 70 72 72  Resp: 16 18 14  (!) 0  Temp: 97.8 F (36.6 C) 97.8 F (36.6 C)    TempSrc: Oral Oral    SpO2:  97% 97% 96%     Vitals:   10/29/20 1334 10/29/20 1348 10/29/20 1400 10/29/20 1415  BP: (!) 142/72 (!) 152/75 (!) 157/77 (!) 158/79  Pulse: 70 70 72 72  Resp: 16 18 14  (!) 0  Temp: 97.8 F (36.6 C) 97.8 F (36.6 C)    TempSrc: Oral Oral    SpO2:  97% 97% 96%    Constitutional: NAD, alert and oriented x 3. Chronically ill appearing. Facial puffiness Eyes: PERRL, lids and conjunctivae pallor ENMT: Mucous membranes are dry.  Neck: normal, supple, no masses, no thyromegaly Respiratory: Bilateral air entry, no wheezing, no crackles. Normal respiratory effort. No accessory muscle use.  Cardiovascular: Regular rate and  rhythm, no murmurs / rubs / gallops. 3+ extremity edema. 2+ pedal pulses. No carotid bruits.  Abdomen: no tenderness, no masses palpated. No hepatosplenomegaly. Bowel sounds positive.  Musculoskeletal: no clubbing / cyanosis.  Right BKA , dressing over left heel  Skin: no rashes, lesions, ulcers.  Neurologic: No gross focal neurologic deficit. Psychiatric: Normal mood and affect.   Labs on Admission: I have personally reviewed following labs and imaging studies  CBC: Recent Labs  Lab 10/29/20 1100  WBC 8.1  NEUTROABS 6.4  HGB 6.7*  HCT 22.4*  MCV 116.7*  PLT 562   Basic Metabolic Panel: Recent Labs  Lab 10/29/20 1100  NA 137  K 5.7*  CL 102  CO2 23  GLUCOSE 392*  BUN 112*  CREATININE 3.05*  CALCIUM 8.6*   GFR: Estimated Creatinine  Clearance: 16.2 mL/min (A) (by C-G formula based on SCr of 3.05 mg/dL (H)). Liver Function Tests: No results for input(s): AST, ALT, ALKPHOS, BILITOT, PROT, ALBUMIN in the last 168 hours. No results for input(s): LIPASE, AMYLASE in the last 168 hours. No results for input(s): AMMONIA in the last 168 hours. Coagulation Profile: No results for input(s): INR, PROTIME in the last 168 hours. Cardiac Enzymes: No results for input(s): CKTOTAL, CKMB, CKMBINDEX, TROPONINI in the last 168 hours. BNP (last 3 results) No results for input(s): PROBNP in the last 8760 hours. HbA1C: No results for input(s): HGBA1C in the last 72 hours. CBG: No results for input(s): GLUCAP in the last 168 hours. Lipid Profile: No results for input(s): CHOL, HDL, LDLCALC, TRIG, CHOLHDL, LDLDIRECT in the last 72 hours. Thyroid Function Tests: No results for input(s): TSH, T4TOTAL, FREET4, T3FREE, THYROIDAB in the last 72 hours. Anemia Panel: No results for input(s): VITAMINB12, FOLATE, FERRITIN, TIBC, IRON, RETICCTPCT in the last 72 hours. Urine analysis:    Component Value Date/Time   COLORURINE AMBER (A) 09/27/2020 1141   APPEARANCEUR CLEAR 09/27/2020 1141    LABSPEC 1.014 09/27/2020 1141   PHURINE 5.0 09/27/2020 1141   GLUCOSEU 50 (A) 09/27/2020 1141   HGBUR NEGATIVE 09/27/2020 1141   BILIRUBINUR NEGATIVE 09/27/2020 1141   KETONESUR NEGATIVE 09/27/2020 1141   PROTEINUR NEGATIVE 09/27/2020 1141   NITRITE NEGATIVE 09/27/2020 [place] 09/27/2020 1141    Radiological Exams on Admission: No results found.  EKG: Independently reviewed.  Sinus rhythm  Assessment/Plan Principal Problem:   Acute kidney injury superimposed on CKD (HCC) Active Problems:   Anemia secondary to renal failure   History of simultaneous kidney and pancreas transplant ([place])   Chronic kidney disease (CKD) stage G3a/A1, moderately decreased glomerular filtration rate (GFR) between 45-59 mL/min/1.73 square meter and albuminuria creatinine ratio less than 30 mg/g (HCC)   Hypertension   Diabetes mellitus with hyperglycemia ([place])      Acute kidney injury superimposed on stage III chronic kidney disease Likely secondary to intravascular volume depletion from anemia Hold ACE inhibitors as well as other nephrotoxic agents Repeat renal parameters in a.m. following transfusion of packed RBC Consult nephrology   Diffuse anasarca Most likely secondary to renal disease Follow-up albumin levels Hold off on diuretic therapy for now due to worsening renal function    Anemia secondary to chronic kidney disease We will obtain Hemoccult to rule out GI source of blood loss Obtain iron panel and ferritin Transfuse 2 units of packed RBC    Status post renal and pancreatic transplant Continue prednisone, cyclosporine and azathioprine    Hyperkalemia Most likely related to ACE inhibitor use and worsening renal function Hold lisinopril Patient received Lokelma in the ER Continue sodium bicarbonate that patient is on for chronic kidney disease Repeat potassium levels    Diabetes mellitus with complications of chronic kidney disease stage III and  hyperglycemia Sliding scale insulin for glycemic control Maintain consistent carbohydrate diet   Hypertension Continue metoprolol   Hypothyroidism Continue Synthroid   Depression  Continue sertraline      DVT prophylaxis: SCD Code Status: Full code Family Communication: Greater than 50% of time was spent discussing plan of care with patient at the bedside.  She verbalizes understanding and agrees with the plan.  All questions and concerns have been addressed.  She verbalizes understanding and agrees with the plan. Disposition Plan: Back to previous home environment Consults called: Nephrology    Shaleta Ruacho MD [facility]  10/29/2020, 2:26 PM

## 2020-10-29 NOTE — Treatment Plan (Signed)
Type 1 diabetic without long acting insulin order. I ordered 18 units nightly (takes 24 units nightly typically). She needs long acting insulin as part of regimen to prevent DKA given she is a type 1 diabetic. Further dose adjustment should be made by the primary team.

## 2020-10-30 DIAGNOSIS — N179 Acute kidney failure, unspecified: Secondary | ICD-10-CM | POA: Diagnosis not present

## 2020-10-30 DIAGNOSIS — N189 Chronic kidney disease, unspecified: Secondary | ICD-10-CM | POA: Diagnosis not present

## 2020-10-30 LAB — URINALYSIS, ROUTINE W REFLEX MICROSCOPIC
Bilirubin Urine: NEGATIVE
Glucose, UA: NEGATIVE mg/dL
Ketones, ur: NEGATIVE mg/dL
Nitrite: POSITIVE — AB
Protein, ur: NEGATIVE mg/dL
Specific Gravity, Urine: 1.015 (ref 1.005–1.030)
pH: 5 (ref 5.0–8.0)

## 2020-10-30 LAB — CBC
HCT: 30.4 % — ABNORMAL LOW (ref 36.0–46.0)
Hemoglobin: 9.9 g/dL — ABNORMAL LOW (ref 12.0–15.0)
MCH: 32.9 pg (ref 26.0–34.0)
MCHC: 32.6 g/dL (ref 30.0–36.0)
MCV: 101 fL — ABNORMAL HIGH (ref 80.0–100.0)
Platelets: 275 10*3/uL (ref 150–400)
RBC: 3.01 MIL/uL — ABNORMAL LOW (ref 3.87–5.11)
RDW: 22.1 % — ABNORMAL HIGH (ref 11.5–15.5)
WBC: 13.6 10*3/uL — ABNORMAL HIGH (ref 4.0–10.5)
nRBC: 0.4 % — ABNORMAL HIGH (ref 0.0–0.2)

## 2020-10-30 LAB — TYPE AND SCREEN
ABO/RH(D): O POS
Antibody Screen: NEGATIVE
Unit division: 0
Unit division: 0

## 2020-10-30 LAB — BASIC METABOLIC PANEL WITH GFR
Anion gap: 9 (ref 5–15)
BUN: 101 mg/dL — ABNORMAL HIGH (ref 6–20)
CO2: 22 mmol/L (ref 22–32)
Calcium: 8.7 mg/dL — ABNORMAL LOW (ref 8.9–10.3)
Chloride: 106 mmol/L (ref 98–111)
Creatinine, Ser: 2.79 mg/dL — ABNORMAL HIGH (ref 0.44–1.00)
GFR, Estimated: 19 mL/min — ABNORMAL LOW
Glucose, Bld: 235 mg/dL — ABNORMAL HIGH (ref 70–99)
Potassium: 5.3 mmol/L — ABNORMAL HIGH (ref 3.5–5.1)
Sodium: 137 mmol/L (ref 135–145)

## 2020-10-30 LAB — PROTEIN / CREATININE RATIO, URINE
Creatinine, Urine: 68.28 mg/dL
Protein Creatinine Ratio: 0.51 mg/mg{Cre} — ABNORMAL HIGH (ref 0.00–0.15)
Total Protein, Urine: 35 mg/dL

## 2020-10-30 LAB — GLUCOSE, CAPILLARY
Glucose-Capillary: 129 mg/dL — ABNORMAL HIGH (ref 70–99)
Glucose-Capillary: 70 mg/dL (ref 70–99)
Glucose-Capillary: 223 mg/dL — ABNORMAL HIGH (ref 70–99)
Glucose-Capillary: 239 mg/dL — ABNORMAL HIGH (ref 70–99)

## 2020-10-30 LAB — BPAM RBC
Blood Product Expiration Date: 202111052359
Blood Product Expiration Date: 202112042359
ISSUE DATE / TIME: 202111031320
ISSUE DATE / TIME: 202111031729
Unit Type and Rh: 5100
Unit Type and Rh: 9500

## 2020-10-30 MED ORDER — AZATHIOPRINE 50 MG PO TABS
25.0000 mg | ORAL_TABLET | Freq: Every day | ORAL | Status: DC
  Filled 2020-10-30: qty 1

## 2020-10-30 MED ORDER — COLLAGENASE 250 UNIT/GM EX OINT
TOPICAL_OINTMENT | Freq: Every day | CUTANEOUS | Status: DC
  Filled 2020-10-30 (×2): qty 30

## 2020-10-30 MED ORDER — GERHARDT'S BUTT CREAM
TOPICAL_CREAM | Freq: Two times a day (BID) | CUTANEOUS | Status: DC
  Administered 2020-10-31 – 2020-11-05 (×4): 1 via TOPICAL
  Filled 2020-10-30: qty 1

## 2020-10-30 MED ORDER — HEPARIN SODIUM (PORCINE) 5000 UNIT/ML IJ SOLN
5000.0000 [IU] | Freq: Three times a day (TID) | INTRAMUSCULAR | Status: DC
  Administered 2020-10-30 – 2020-11-07 (×24): 5000 [IU] via SUBCUTANEOUS
  Filled 2020-10-30 (×23): qty 1

## 2020-10-30 MED ORDER — ROSUVASTATIN CALCIUM 5 MG PO TABS
5.0000 mg | ORAL_TABLET | Freq: Every day | ORAL | Status: DC
  Administered 2020-10-30 – 2020-11-06 (×8): 5 mg via ORAL
  Filled 2020-10-30 (×8): qty 1

## 2020-10-30 MED ORDER — ALBUMIN HUMAN 25 % IV SOLN
25.0000 g | Freq: Two times a day (BID) | INTRAVENOUS | Status: DC
  Administered 2020-10-30 – 2020-10-31 (×4): 25 g via INTRAVENOUS
  Filled 2020-10-30 (×5): qty 100

## 2020-10-30 MED ORDER — CYCLOSPORINE 25 MG PO CAPS
75.0000 mg | ORAL_CAPSULE | Freq: Two times a day (BID) | ORAL | Status: DC
  Administered 2020-10-30 – 2020-11-07 (×17): 75 mg via ORAL
  Filled 2020-10-30 (×23): qty 3

## 2020-10-30 NOTE — Consult Note (Addendum)
[facility] Nurse Consult Note: Reason for Consult: Consult requested for left heel and sacrum. Wound type: Sacrum with unstageable pressure injury; 5X4cm, 100% loose yellow slough, mod amt tan drainage, no odor. Bilat butocks are red and macerated with patchy rash; appearance is consistent with moisture associated skin damage and probable candidiasis. Left heel with unstageable pressure injury; 3X.5cm, 100% loose yellow slough, mod amt tan drainage, no odor. Pressure Injury POA: Yes Dressing procedure/placement/frequency: Topical treatment orders provided for bedside nurses to perform daily to assist with enzymatic debridement as follows:Apply Santyl to left heel and sacrum wounds Q day, then cover with moist fluffed gauze and foam dressing.  (Change foam dressing Q 3 days or PRN soiling.) Float heel to reduce pressure.  Apply Gerhardts cream to buttocks BID and with each turning and cleaning session to repel moisture and promote healing. Please re-consult if further assistance is needed.  Thank-you,  Julien Girt MSN, [place], [place], [place], [place]

## 2020-10-30 NOTE — Progress Notes (Signed)
PROGRESS NOTE    [name]  VPX:[id number] DOB: 15-Aug-1961 DOA: 10/29/2020 PCP: [name], MD   Brief Narrative:  [name] is a 59 y.o. female with medical history significant for DM, S/P renal and pancreas transplant about 10 years ago, Stage 3 CKD, HTN, s/p Rt BKA for osteomyelitis who presents to the emergency room from the subacute rehab center for evaluation of abnormal lab values.  Patient noted to have serum creatinine of 3.8 above her baseline of 1.27 from 3 weeks ago, hemoglobin of 6.5g/dl compared to her baseline of 7.7g/dl 3 weeks ago. Her stools are brown in color. She denies having any hematemesis, hematochezia or melena stools. She denies any NSAID use.  She denies having any nausea, no vomiting, no abdominal pain, no diarrhea, no urinary symptoms. She complains of feeling weak, bloated and fatigued. Labs show sodium 137, potassium 3.7, chloride 102, bicarb 23, glucose 392, BUN 112, creatinine 3.05, calcium 8.6, white count 8.1, hemoglobin 6.7, hematocrit 22.4, MCV 116, RDW 18.6, platelet count 291. Respiratory viral panel is negative. Twelve-lead EKG reviewed by me shows sinus rhythm.  In the ED Patient noted to have worsening of her renal function with serum creatinine today on admission 3.05 compared to baseline of 1.27, potassium of 5.7, hemoglobin of 6.7 compared to baseline of 7.7 and serum glucose of 392.  Patient was treated with [facility] in the emergency room and will be admitted to the hospital for further evaluation.   Assessment & Plan:   Principal Problem:   Acute kidney injury superimposed on CKD ([place]) Active Problems:   Anemia secondary to renal failure   History of simultaneous kidney and pancreas transplant ([place])   Chronic kidney disease (CKD) stage G3a/A1, moderately decreased glomerular filtration rate (GFR) between 45-59 mL/min/1.73 square meter and albuminuria creatinine ratio less than 30 mg/g (HCC)   Hypertension   Diabetes mellitus with  hyperglycemia ([facility]CC)  Acute kidney injury superimposed on stage IIIB chronic kidney disease Likely secondary to intravascular volume depletion from anemia Hold ACE inhibitors as well as other nephrotoxic agents Repeat renal parameters in a.m. following transfusion of packed RBC Consult nephrology  Diffuse anasarca Most likely secondary to renal disease Follow-up albumin levels Hold off on diuretic therapy for now due to worsening renal function  Anemia secondary to chronic kidney disease We will obtain Hemoccult to rule out GI source of blood loss Obtain iron panel and ferritin Transfuse 2 units of packed RBC  Pressure ulcer stage III on sacral area and diabetic ulcer on left heel, POA.  Continue supportive care, bandages per nursing protocol  Status post renal and pancreatic transplant Continue prednisone, cyclosporine and azathioprine  Hyperkalemia Most likely related to ACE inhibitor use and worsening renal function Hold lisinopril Patient received Lokelma in the ER Lab Results  Component Value Date   K 5.3 (H) 10/30/2020   Uncontrolled diabetes mellitus with complications of chronic kidney disease stage III and hyperglycemia Continue sliding scale insulin, hypoglycemic protocol, diabetic diet Lab Results  Component Value Date   HGBA1C 7.2 (H) 09/26/2020   Hypertension Continue metoprolol  Hypothyroidism Continue Synthroid  Depression  Continue sertraline   DVT prophylaxis: SCDs Code Status: Full code Family Communication: None present  Status is: Inpatient  Dispo: The patient is from: Home              Anticipated d/c is to: Home              Anticipated d/c date is: 24 to 40  hours pending clinical course              Patient currently not medically stable for discharge given need for ongoing monitoring of hemoglobin, creatinine and vital signs given acute illnesses as above  Consultants:   Nephrology  Procedures:   None  Antimicrobials:    None indicated  Subjective: No acute issues or events overnight denies nausea, vomiting, diarrhea, constipation, headache, fevers, chills.  Objective: Vitals:   10/29/20 2000 10/29/20 2046 10/29/20 2311 10/30/20 0426  BP: (!) 165/83 (!) 152/87 (!) 143/78 119/67  Pulse: 76 78 73 81  Resp: 16 16 18 19   Temp:  97.9 F (36.6 C) 98.9 F (37.2 C) 99.3 F (37.4 C)  TempSrc:  Oral Oral Oral  SpO2: 99% 97% 96% 91%    Intake/Output Summary (Last 24 hours) at 10/30/2020 6546 Last data filed at 10/29/2020 2046 Gross per 24 hour  Intake 587 ml  Output --  Net 587 ml   There were no vitals filed for this visit.  Examination:  General exam: Appears calm and comfortable  Respiratory system: Clear to auscultation. Respiratory effort normal. Cardiovascular system: S1 & S2 heard, RRR. No JVD, murmurs, rubs, gallops or clicks. No pedal edema. Gastrointestinal system: Abdomen is nondistended, soft and nontender. No organomegaly or masses felt. Normal bowel sounds heard. Central nervous system: Alert and oriented. No focal neurological deficits. Extremities: Symmetric 5 x 5 power. Skin: No rashes, lesions or ulcers Psychiatry: Judgement and insight appear normal. Mood & affect appropriate.     Data Reviewed: I have personally reviewed following labs and imaging studies  CBC: Recent Labs  Lab 10/29/20 1100 10/29/20 1600 10/29/20 2153 10/30/20 0114  WBC 8.1 6.1 11.4* 13.6*  NEUTROABS 6.4 5.7 9.9*  --   HGB 6.7* 7.0* 11.4* 9.9*  HCT 22.4* 22.5* 35.1* 30.4*  MCV 116.7* 111.4* 102.3* 101.0*  PLT 291 227 308 503   Basic Metabolic Panel: Recent Labs  Lab 10/29/20 1100 10/29/20 2153 10/30/20 0114  NA 137  --  137  K 5.7*  --  5.3*  CL 102  --  106  CO2 23  --  22  GLUCOSE 392*  --  235*  BUN 112*  --  101*  CREATININE 3.05*  --  2.79*  CALCIUM 8.6*  --  8.7*  PHOS  --  7.3*  --    GFR: Estimated Creatinine Clearance: 17.7 mL/min (A) (by C-G formula based on SCr of 2.79  mg/dL (H)). Liver Function Tests: Recent Labs  Lab 10/29/20 2153  ALBUMIN 1.6*   No results for input(s): LIPASE, AMYLASE in the last 168 hours. No results for input(s): AMMONIA in the last 168 hours. Coagulation Profile: No results for input(s): INR, PROTIME in the last 168 hours. Cardiac Enzymes: No results for input(s): CKTOTAL, CKMB, CKMBINDEX, TROPONINI in the last 168 hours. BNP (last 3 results) No results for input(s): PROBNP in the last 8760 hours. HbA1C: No results for input(s): HGBA1C in the last 72 hours. CBG: Recent Labs  Lab 10/29/20 1533 10/29/20 1718 10/29/20 2141 10/30/20 0620  GLUCAP 367* 353* 273* 129*   Lipid Profile: No results for input(s): CHOL, HDL, LDLCALC, TRIG, CHOLHDL, LDLDIRECT in the last 72 hours. Thyroid Function Tests: No results for input(s): TSH, T4TOTAL, FREET4, T3FREE, THYROIDAB in the last 72 hours. Anemia Panel: Recent Labs    10/29/20 2153  FERRITIN 1,032*  TIBC 143*  IRON 95   Sepsis Labs: No results for input(s): PROCALCITON, LATICACIDVEN in the  last 168 hours.  Recent Results (from the past 240 hour(s))  Respiratory Panel by RT PCR (Flu A&B, Covid) - Nasopharyngeal Swab     Status: None   Collection Time: 10/29/20 12:58 PM   Specimen: Nasopharyngeal Swab  Result Value Ref Range Status   SARS Coronavirus 2 by RT PCR NEGATIVE NEGATIVE Final    Comment: (NOTE) SARS-CoV-2 target nucleic acids are NOT DETECTED.  The SARS-CoV-2 RNA is generally detectable in upper respiratoy specimens during the acute phase of infection. The lowest concentration of SARS-CoV-2 viral copies this assay can detect is 131 copies/mL. A negative result does not preclude SARS-Cov-2 infection and should not be used as the sole basis for treatment or other patient management decisions. A negative result may occur with  improper specimen collection/handling, submission of specimen other than nasopharyngeal swab, presence of viral mutation(s) within  the areas targeted by this assay, and inadequate number of viral copies (<131 copies/mL). A negative result must be combined with clinical observations, patient history, and epidemiological information. The expected result is Negative.  Fact Sheet for Patients:  PinkCheek.be  Fact Sheet for Healthcare Providers:  GravelBags.it  This test is no t yet approved or cleared by the [place] FDA and  has been authorized for detection and/or diagnosis of SARS-CoV-2 by FDA under an Emergency Use Authorization (EUA). This EUA will remain  in effect (meaning this test can be used) for the duration of the COVID-19 declaration under Section 564(b)(1) of the Act, 21 U.S.C. section 360bbb-3(b)(1), unless the authorization is terminated or revoked sooner.     Influenza A by PCR NEGATIVE NEGATIVE Final   Influenza B by PCR NEGATIVE NEGATIVE Final    Comment: (NOTE) The Xpert Xpress SARS-CoV-2/FLU/RSV assay is intended as an aid in  the diagnosis of influenza from Nasopharyngeal swab specimens and  should not be used as a sole basis for treatment. Nasal washings and  aspirates are unacceptable for Xpert Xpress SARS-CoV-2/FLU/RSV  testing.  Fact Sheet for Patients: PinkCheek.be  Fact Sheet for Healthcare Providers: GravelBags.it  This test is not yet approved or cleared by the [place] FDA and  has been authorized for detection and/or diagnosis of SARS-CoV-2 by  FDA under an Emergency Use Authorization (EUA). This EUA will remain  in effect (meaning this test can be used) for the duration of the  Covid-19 declaration under Section 564(b)(1) of the Act, 21  U.S.C. section 360bbb-3(b)(1), unless the authorization is  terminated or revoked. Performed at [facility], [place] [address], [place], [place] [address]          Radiology Studies: US Renal Transplant  w/Doppler  Result Date: 10/29/2020 CLINICAL DATA:  Acute renal insufficiency, renal transplant EXAM: ULTRASOUND OF RENAL TRANSPLANT WITH RENAL DOPPLER ULTRASOUND TECHNIQUE: Ultrasound examination of the renal transplant was performed with gray-scale, color and duplex doppler evaluation. COMPARISON:  09/27/2020 FINDINGS: Transplant kidney location: RLQ Transplant Kidney: Renal measurements: 11.7 x 5.5 x 7.3 cm = volume: 242.22mL. Echotexture is increased consistent with medical renal disease. There is mild hydronephrosis. No nephrolithiasis or renal mass. Color flow in the main renal artery:  Yes Color flow in the main renal vein:  Yes Duplex Doppler Evaluation: Main Renal Artery Velocity: 38.4 cm/sec Main Renal Artery Resistive Index: 0.71 Venous waveform in main renal vein:  Present Intrarenal resistive index in upper pole:  0.76 (normal 0.6-0.8; equivocal 0.8-0.9; abnormal >= 0.9) Intrarenal resistive index in lower pole: 0.69 (normal 0.6-0.8; equivocal 0.8-0.9; abnormal >= 0.9) Bladder:  Bladder is moderately distended, with debris layering dependently. Other findings:  Trace free fluid in the pelvis. IMPRESSION: 1. Increased cortical echotexture within the right lower quadrant transplant kidney, consistent with medical renal disease. 2. Mild hydronephrosis of the right lower quadrant transplant kidney. 3. Normal Doppler interrogation of the transplant kidney. 4. Debris layering dependently within the bladder. Electronically Signed   By: Randa Ngo M.D.   On: 10/29/2020 15:46        Scheduled Meds: . atorvastatin  20 mg Oral QHS  . azaTHIOprine  50 mg Oral Daily  . cycloSPORINE  75 mg Oral BID  . insulin aspart  0-15 Units Subcutaneous TID WC  . insulin glargine  18 Units Subcutaneous QHS  . levothyroxine  50 mcg Oral Daily  . metoprolol tartrate  12.5 mg Oral BID  . nutrition supplement (JUVEN)  1 packet Oral BID BM  . predniSONE  5 mg Oral Daily  . sertraline  25 mg Oral Daily  . sodium  bicarbonate  650 mg Oral BID   Continuous Infusions: . albumin human       LOS: 1 day   Time spent: 94min  Bentleigh Stankus C Polo Mcmartin, DO [facility]  If 7PM-7AM, please contact night-coverage [email or web address]  10/30/2020, 7:21 AM

## 2020-10-30 NOTE — Progress Notes (Signed)
Pt transferred from ED to 4E21, appeared lethargy, oriented x 4, on room air, hemodynamically stable, NSR on monitor.  Call bell with in reach, room oriented. CHG bath given.CCMD called with 2nd person verified.   Pt presented with red skin rash on her perineal, groin and left leg. Pressure ulcer stage III on sacral area and diabetic ulcer on left heel. Wound cleaned with NSS and foam dressing changed.  Right stump from BKA appeared with staples wound closure, dry and clean, no drainage.  No immediate distress at arrival. We will continue to monitor.    Kennyth Lose, RN

## 2020-10-30 NOTE — Progress Notes (Addendum)
Nephrology Follow-Up Consult note   Assessment/Recommendations: [name] is a/an 59 y.o. 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 -Minimal hydronephrosis in the lower pole of the transplanted kidney on ultrasound.  This is not uncommon in kidney grafts.  Could consider repeat imaging if creatinine fails to improve -Management of hypoalbuminemia, anasarca, intravascular volume depletion as below -Continue to monitor daily Cr, Dose meds for GFR -Monitor Daily I/Os, Daily weight  -Maintain MAP>65 for optimal renal perfusion.  -Avoid nephrotoxic medications including NSAIDs and Vanc/Zosyn combo -Currently no indication for HD  Diffuse Anasarca/hypoalbuminemia: Likely associated with chronic inflammation related to infections.  Nutrition may be contributing.  This denotes a poor prognosis -IV albumin 25 g twice daily until serum albumin is 2.5 -Encourage dietary consult -Hold IV Lasix for now unless hypoxia develops  Hypertension:  Blood pressure acceptable on current regimen  Hyperkalemia: Mild elevation with potassium of 5.3 likely secondary to CNA use and AKI.  Continue to monitor and could start [place] when potassium is greater than 5.5.  Anemia due to CKD/chronic inflammation: Severe anemia likely secondary to CKD and chronic inflammation as indicated by low albumin and elevated iron saturation status post 2 units of blood. -Likely plan for ESA administration this admission -Patient has been set up to continue Retacrit every 2 weekly outpatient -Transfuse for Hgb<7 g/dL  Uncontrolled Diabetes Mellitus Type 1 with hyperglycemia: Diabetic management per primary team  History of kidney/pancreas transplant: Remote.  Follows with Dr. Cira Servant.  Baseline  creatinine around 1.3.  Continue home cyclosporine, aza (reduced dose) and prednisone.    Obtain trough in the morning  Chronic metabolic acidosis: Associated with kidney disease.  Can continue home sodium bicarbonate.  Hyperphosphatemia: Phosphorus 7.3 actually a good sign given the concern regarding her nutrition status.  Hopefully will improve with improving kidney function.  Hold binders for now   Recommendations conveyed to primary service.    [facility] 10/30/2020 11:57 AM  ___________________________________________________________  CC: Kidney transplant, anasarca, CKD, AKI  Interval History/Subjective: Patient states she feels about the same today.  Feels that her edema is unchanged.  Has a sacral wound that has been bothering her.   Medications:  Current Facility-Administered Medications  Medication Dose Route Frequency Provider Last Rate Last Admin  . acetaminophen (TYLENOL) tablet 650 mg  650 mg Oral Q6H PRN Agbata, Tochukwu, MD      . albumin human 25 % solution 25 g  25 g Intravenous Q12H [name], MD      . atorvastatin (LIPITOR) tablet 20 mg  20 mg Oral QHS Agbata, Tochukwu, MD   20 mg at 10/29/20 2208  . azaTHIOprine (IMURAN) tablet 50 mg  50 mg Oral Daily Agbata, Tochukwu, MD      . collagenase (SANTYL) ointment   Topical Daily Little Ishikawa, MD      . cycloSPORINE (SANDIMMUNE) capsule 75 mg  75 mg Oral BID [name], MD      . Gerhardt's butt cream   Topical BID Little Ishikawa, MD      . HYDROcodone-acetaminophen (NORCO/VICODIN) 5-325 MG per tablet 1 tablet  1 tablet Oral Q4H PRN Collier Bullock, MD   1 tablet at 10/29/20 2217  . insulin aspart (novoLOG) injection 0-15  Units  0-15 Units Subcutaneous TID WC Agbata, Tochukwu, MD   2 Units at 10/30/20 0646  . insulin glargine (LANTUS) injection 18 Units  18 Units Subcutaneous QHS [name], MD   18 Units at 10/29/20 2207  . levothyroxine  (SYNTHROID) tablet 50 mcg  50 mcg Oral Daily Agbata, Tochukwu, MD   50 mcg at 10/30/20 0646  . metoprolol tartrate (LOPRESSOR) tablet 12.5 mg  12.5 mg Oral BID Agbata, Tochukwu, MD   12.5 mg at 10/29/20 2208  . nutrition supplement (JUVEN) (JUVEN) powder packet 1 packet  1 packet Oral BID BM Agbata, Tochukwu, MD      . ondansetron (ZOFRAN) tablet 4 mg  4 mg Oral Q6H PRN Agbata, Tochukwu, MD       Or  . ondansetron (ZOFRAN) injection 4 mg  4 mg Intravenous Q6H PRN Agbata, Tochukwu, MD      . predniSONE (DELTASONE) tablet 5 mg  5 mg Oral Daily Agbata, Tochukwu, MD      . sertraline (ZOLOFT) tablet 25 mg  25 mg Oral Daily Agbata, Tochukwu, MD      . sodium bicarbonate tablet 650 mg  650 mg Oral BID Agbata, Tochukwu, MD   650 mg at 10/29/20 2208      Review of Systems: 10 systems reviewed and negative except per interval history/subjective  Physical Exam: Vitals:   10/30/20 0426 10/30/20 0740  BP: 119/67 129/68  Pulse: 81 84  Resp: 19 18  Temp: 99.3 F (37.4 C) 99.2 F (37.3 C)  SpO2: 91% 94%   No intake/output data recorded.  Intake/Output Summary (Last 24 hours) at 10/30/2020 1157 Last data filed at 10/29/2020 2046 Gross per 24 hour  Intake 587 ml  Output --  Net 587 ml   Constitutional: Thin, frail, lying in bed ENMT: ears and nose without scars or lesions, dry mucous membranes CV: normal rate, diffuse pitting edema Respiratory: Bilateral chest rise, normal work of breathing Gastrointestinal: soft, non-tender, no palpable masses or hernias Skin: Skin thinning with scattered abrasions, otherwise no rashes Psych: alert, judgement/insight appropriate, appropriate mood and affect   Test Results I personally reviewed new and old clinical labs and radiology tests Lab Results  Component Value Date   NA 137 10/30/2020   K 5.3 (H) 10/30/2020   CL 106 10/30/2020   CO2 22 10/30/2020   BUN 101 (H) 10/30/2020   CREATININE 2.79 (H) 10/30/2020   CALCIUM 8.7 (L) 10/30/2020    ALBUMIN 1.6 (L) 10/29/2020   PHOS 7.3 (H) 10/29/2020

## 2020-10-31 DIAGNOSIS — N179 Acute kidney failure, unspecified: Secondary | ICD-10-CM | POA: Diagnosis not present

## 2020-10-31 DIAGNOSIS — N189 Chronic kidney disease, unspecified: Secondary | ICD-10-CM | POA: Diagnosis not present

## 2020-10-31 LAB — RENAL FUNCTION PANEL
Albumin: 2.1 g/dL — ABNORMAL LOW (ref 3.5–5.0)
Anion gap: 12 (ref 5–15)
BUN: 94 mg/dL — ABNORMAL HIGH (ref 6–20)
CO2: 21 mmol/L — ABNORMAL LOW (ref 22–32)
Calcium: 8.9 mg/dL (ref 8.9–10.3)
Chloride: 108 mmol/L (ref 98–111)
Creatinine, Ser: 2.36 mg/dL — ABNORMAL HIGH (ref 0.44–1.00)
GFR, Estimated: 23 mL/min — ABNORMAL LOW (ref >60)
Glucose, Bld: 109 mg/dL — ABNORMAL HIGH (ref 70–99)
Phosphorus: 5.5 mg/dL — ABNORMAL HIGH (ref 2.5–4.6)
Potassium: 5 mmol/L (ref 3.5–5.1)
Sodium: 141 mmol/L (ref 135–145)

## 2020-10-31 LAB — CBC
HCT: 28.1 % — ABNORMAL LOW (ref 36.0–46.0)
Hemoglobin: 9 g/dL — ABNORMAL LOW (ref 12.0–15.0)
MCH: 33.1 pg (ref 26.0–34.0)
MCHC: 32 g/dL (ref 30.0–36.0)
MCV: 103.3 fL — ABNORMAL HIGH (ref 80.0–100.0)
Platelets: 232 10*3/uL (ref 150–400)
RBC: 2.72 MIL/uL — ABNORMAL LOW (ref 3.87–5.11)
RDW: 21.2 % — ABNORMAL HIGH (ref 11.5–15.5)
WBC: 7.6 10*3/uL (ref 4.0–10.5)
nRBC: 0.3 % — ABNORMAL HIGH (ref 0.0–0.2)

## 2020-10-31 LAB — GLUCOSE, CAPILLARY
Glucose-Capillary: 120 mg/dL — ABNORMAL HIGH (ref 70–99)
Glucose-Capillary: 58 mg/dL — ABNORMAL LOW (ref 70–99)
Glucose-Capillary: 95 mg/dL (ref 70–99)
Glucose-Capillary: 315 mg/dL — ABNORMAL HIGH (ref 70–99)

## 2020-10-31 MED ORDER — DARBEPOETIN ALFA 100 MCG/0.5ML IJ SOSY
100.0000 ug | PREFILLED_SYRINGE | INTRAMUSCULAR | Status: DC
  Administered 2020-10-31: 100 ug via SUBCUTANEOUS
  Filled 2020-10-31 (×2): qty 0.5

## 2020-10-31 MED ORDER — AZATHIOPRINE 50 MG PO TABS
50.0000 mg | ORAL_TABLET | Freq: Every day | ORAL | Status: DC
  Administered 2020-10-31 – 2020-11-07 (×8): 50 mg via ORAL
  Filled 2020-10-31 (×9): qty 1

## 2020-10-31 NOTE — Evaluation (Signed)
Occupational Therapy Evaluation Patient Details Name: [name] MRN: [id number] DOB: August 06, 1961 Today's Date: 10/31/2020    History of Present Illness 59 y.o. 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  Complicated recent medical history.  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  She presents with lower body weakness and limited AROM to both legs, poor sit balance, poor activity tolerance, L heal wound, swelling to L foot, and generalized upper body weakness.  The patient admits to declining functioning at home prior to the amputation, but was able to mobilize with 4WRW (increasing falls) and could assist with some meal prep and light home management.  OT will follow in the acute setting, SNF will probably remain the recommendation, but she declines to go back to the last SNF.      Follow Up Recommendations  SNF    Equipment Recommendations  Wheelchair (measurements OT);Wheelchair cushion (measurements OT)    Recommendations for Other Services       Precautions / Restrictions Precautions Precautions: Fall Restrictions Weight Bearing Restrictions: Yes RLE Weight Bearing: Non weight bearing Other Position/Activity Restrictions: L foot ulcer to heal.  no WB orders.  Patient stating she can bear weight through L leg      Mobility Bed Mobility Overal bed mobility: Needs Assistance Bed Mobility: Rolling;Supine to Sit;Sit to Supine Rolling: Max assist   Supine to sit: Max assist Sit to supine: Max assist   General bed mobility comments: Very weak B LE's.  Little to no AROM.    Transfers                 General transfer comment: Deferred.  Patient will need  mechanical lift.    Balance Overall balance assessment: Needs assistance Sitting-balance support: Bilateral upper extremity supported Sitting balance-Leahy Scale: Poor   Postural control: Posterior lean;Left lateral lean                                 ADL either performed or assessed with clinical judgement   ADL Overall ADL's : Needs assistance/impaired Eating/Feeding: Independent;Bed level   Grooming: Wash/dry hands;Wash/dry face;Oral care;Set up;Bed level   Upper Body Bathing: Minimal assistance;Bed level   Lower Body Bathing: Maximal assistance;Bed level   Upper Body Dressing : Minimal assistance;Bed level   Lower Body Dressing: Maximal assistance;Bed level                       Vision Baseline Vision/History: Wears glasses Wears Glasses: Distance only;Reading only Patient Visual Report: No change from baseline       Perception     Praxis      Pertinent Vitals/Pain Pain Assessment: Faces Faces Pain Scale: Hurts little more Pain Location: low back Pain Descriptors / Indicators: Aching;Guarding Pain Intervention(s): Monitored during session;Repositioned     Hand Dominance Right   Extremity/Trunk Assessment Upper Extremity Assessment Upper Extremity Assessment: Generalized weakness   Lower Extremity Assessment Lower Extremity Assessment: Defer to PT evaluation;RLE deficits/detail;LLE deficits/detail   Cervical / Trunk Assessment Cervical / Trunk Assessment: Kyphotic   Communication Communication Communication: No difficulties   Cognition Arousal/Alertness: Awake/alert Behavior During  Therapy: WFL for tasks assessed/performed Overall Cognitive Status: Within Functional Limits for tasks assessed                                     General Comments   O2 removed for mobility - patient remained 92% and above.  HR up to 81BPM with EOB sitting.      Exercises     Shoulder Instructions      Home Living  Family/patient expects to be discharged to:: Skilled nursing facility Living Arrangements: Spouse/significant other Available Help at Discharge: Family;Available PRN/intermittently Type of Home: House Home Access: Level entry     Home Layout: One level               Home Equipment: Walker - 4 wheels;Cane - single point   Additional Comments: Pt reports frequent falls at home      Prior Functioning/Environment Level of Independence: Needs assistance  Gait / Transfers Assistance Needed: Limited ambulation with rollator ADL's / Homemaking Assistance Needed: was assisting with meals, home mangement, cared for dog, and own meds.  States that the last few weeks were getting harding due to swelling to both legs and pain associated with R leg.  Falling more.            OT Problem List: Decreased strength;Decreased range of motion;Decreased activity tolerance;Impaired balance (sitting and/or standing);Pain      OT Treatment/Interventions: Self-care/ADL training;Therapeutic exercise;Balance training;DME and/or AE instruction;Therapeutic activities    OT Goals(Current goals can be found in the care plan section) Acute Rehab OT Goals Patient Stated Goal: I want to move better so I can eventually walk again. OT Goal Formulation: With patient Time For Goal Achievement: 11/14/20 Potential to Achieve Goals: Fair  OT Frequency: Min 2X/week   Barriers to D/C: Decreased caregiver support          Co-evaluation              AM-PAC OT "6 Clicks" Daily Activity     Outcome Measure Help from another person eating meals?: None Help from another person taking care of personal grooming?: A Little Help from another person toileting, which includes using toliet, bedpan, or urinal?: A Lot Help from another person bathing (including washing, rinsing, drying)?: A Lot Help from another person to put on and taking off regular upper body clothing?: A Little Help from another person to put  on and taking off regular lower body clothing?: A Lot 6 Click Score: 16   End of Session Equipment Utilized During Treatment: Oxygen Nurse Communication: Mobility status  Activity Tolerance: Patient limited by pain Patient left: in bed;with call bell/phone within reach  OT Visit Diagnosis: Muscle weakness (generalized) (M62.81);History of falling (Z91.81);Pain Pain - part of body:  (low back)                Time: 1635-1701 OT Time Calculation (min): 26 min Charges:  OT General Charges $OT Visit: 1 Visit OT Evaluation $OT Eval Moderate Complexity: 1 Mod  10/31/2020  Rich, OTR/L  Acute Rehabilitation Services  Office:  [phone]   Metta Clines 10/31/2020, 5:04 PM

## 2020-10-31 NOTE — Progress Notes (Signed)
Results for LAURENASHLEY, VIAR (MRN [id number]) as of 10/31/2020 12:18  Ref. Range 10/30/2020 16:41 10/30/2020 21:12 10/31/2020 06:18 10/31/2020 11:09 10/31/2020 12:10  Glucose-Capillary Latest Ref Range: 70 - 99 mg/dL 223 (H) 239 (H) 120 (H) 58 (L) 95  Noted that blood sugar fasting was 120 mg/dl and then 58 mg/dl at 11:00.  Recommend decreasing Lantus to 15 units at HS. If blood sugars continue to be less than 70 mg/dl, may need to decrease Novolog correction scale to SENSITIVE TID.   Harvel Ricks RN BSN CDE Diabetes Coordinator Pager: [phone]  8am-5pm

## 2020-10-31 NOTE — Progress Notes (Signed)
Pt's generalized weakness, oriented x 4. Stated that she had a good appetite today. Per RN day shift, Sharyn Lull, reported. Pt expressed her feeling about she got poorly support from her husband. Her sister and her family members live in [place] want Pt to go back if she discharged from hospital.     Pt on room air, in the evening SPO2 was in low 80s%, O2 NCl 3 LPM given. Her SPO2 maintained well above 95-98% on 3LPM. Denied SOB. Auscultated lung clear bilaterally, no crackles, no rhonchi. Her hemodynamically stable, NSR on monitor, HR 70s-80s, BP within normal limits. 25% Human Albumin 25 mg given per schedule.  We applied Santyl and foam dressing changed on her pressure ulcer stage III on sacral and on her non healing diabetic ulcer on her left heel area.    She's incontinence both bowel movement and urine. Purewick in used, [place]' butt cream applied on her skin rash on perineal and buttock areas due to moisture associated with skin damaged and probably yeast infection. May consider for antifungal cream around perineal area.   No immediate distress. We will continue to monitor.  Kennyth Lose, RN

## 2020-10-31 NOTE — Progress Notes (Signed)
CRITICAL VALUE ALERT  Critical Value:  CBG 58  Date & Time Notied:  10/31/20 1135 recheck after 118 ml orange juice consumed 95 at 12:10  Provider Notified: Dr. Avon Gully, MD  Orders Received/Actions taken: Paged MD, provider aware.

## 2020-10-31 NOTE — Progress Notes (Signed)
Nephrology Follow-Up Consult note   Assessment/Recommendations: [name] is a/an 59 y.o. 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 -Minimal hydronephrosis in the lower pole of the transplanted kidney on ultrasound.  Repeat imaging of creatinine fails to improve -Management of hypoalbuminemia, anasarca, intravascular volume depletion as below -Continue to monitor daily Cr, Dose meds for GFR -Monitor Daily I/Os, Daily weight  -Currently no indication for HD  Diffuse Anasarca/hypoalbuminemia: Likely associated with chronic inflammation related to infections.  Nutrition may be contributing.  This denotes a poor prognosis -IV albumin 25 g twice daily until serum albumin is 2.5 -Encourage dietary protein -Hold IV Lasix for now unless hypoxia develops  Hypertension:  Blood pressure acceptable on current regimen.  Continue to monitor  Hyperkalemia: Improving with resolving AKI.  Continue to monitor  Anemia due to CKD/chronic inflammation: Severe anemia likely secondary to CKD and chronic inflammation as indicated by low albumin and elevated iron saturation status post 2 units of blood. -Aranesp 100 MCG weekly -Patient has been set up to continue Retacrit every 2 weekly outpatient -Transfuse for Hgb<7 g/dL  Uncontrolled Diabetes Mellitus Type 1 with hyperglycemia: Diabetic management per primary team  History of kidney/pancreas transplant: Remote.  Follows with Dr. Cira Servant.  Baseline creatinine around 1.3.  Continue home cyclosporine, aza, and prednisone.    Obtain trough in the morning  Chronic metabolic acidosis: Associated with kidney disease.  Can continue home sodium bicarbonate.  Hyperphosphatemia: Improved to 5.5 with improving renal function.   Continue to monitor   Recommendations conveyed to primary service.    [facility] 10/31/2020 12:31 PM  ___________________________________________________________  CC: Kidney transplant, anasarca, CKD, AKI  Interval History/Subjective: Patient feels that her urine output is adequate.  Edema may be slightly improved.  Feels that she cannot move her legs very well.  She does wiggle her legs in the room.  Unable to lift them off the bed.  Medications:  Current Facility-Administered Medications  Medication Dose Route Frequency Provider Last Rate Last Admin  . acetaminophen (TYLENOL) tablet 650 mg  650 mg Oral Q6H PRN Agbata, Tochukwu, MD      . albumin human 25 % solution 25 g  25 g Intravenous Q12H [name], MD 60 mL/hr at 10/31/20 0849 25 g at 10/31/20 0849  . azaTHIOprine (IMURAN) tablet 50 mg  50 mg Oral Daily [name], MD   50 mg at 10/31/20 1047  . collagenase (SANTYL) ointment   Topical Daily Little Ishikawa, MD   Given at 10/31/20 [phone]  . cycloSPORINE (SANDIMMUNE) capsule 75 mg  75 mg Oral BID [name], MD   75 mg at 10/31/20 0830  . Darbepoetin Alfa (ARANESP) injection 100 mcg  100 mcg Subcutaneous Q Fri-1800 [name], MD      . Gerhardt's butt cream   Topical BID Little Ishikawa, MD   Given at 10/31/20 0935  . heparin injection 5,000 Units  5,000 Units Subcutaneous Q8H Little Ishikawa, MD   5,000 Units at 10/31/20 0606  . HYDROcodone-acetaminophen (NORCO/VICODIN) 5-325 MG per tablet 1 tablet  1 tablet Oral Q4H PRN Agbata, Tochukwu, MD   1 tablet at 10/31/20 0932  . insulin aspart (novoLOG) injection 0-15 Units  0-15 Units Subcutaneous TID  WC Agbata, Tochukwu, MD   5 Units at 10/30/20 1641  . insulin glargine (LANTUS) injection 18 Units  18 Units Subcutaneous QHS [name], MD   18 Units at 10/30/20 2028  . levothyroxine (SYNTHROID) tablet 50 mcg  50 mcg Oral Daily Agbata, Tochukwu, MD   50  mcg at 10/31/20 0606  . metoprolol tartrate (LOPRESSOR) tablet 12.5 mg  12.5 mg Oral BID Agbata, Tochukwu, MD   12.5 mg at 10/31/20 0934  . nutrition supplement (JUVEN) (JUVEN) powder packet 1 packet  1 packet Oral BID BM Agbata, Tochukwu, MD   1 packet at 10/30/20 1214  . ondansetron (ZOFRAN) tablet 4 mg  4 mg Oral Q6H PRN Agbata, Tochukwu, MD       Or  . ondansetron (ZOFRAN) injection 4 mg  4 mg Intravenous Q6H PRN Agbata, Tochukwu, MD      . predniSONE (DELTASONE) tablet 5 mg  5 mg Oral Daily Agbata, Tochukwu, MD   5 mg at 10/31/20 0933  . rosuvastatin (CRESTOR) tablet 5 mg  5 mg Oral QHS Little Ishikawa, MD   5 mg at 10/30/20 2028  . sertraline (ZOLOFT) tablet 25 mg  25 mg Oral Daily Agbata, Tochukwu, MD   25 mg at 10/31/20 0936  . sodium bicarbonate tablet 650 mg  650 mg Oral BID Agbata, Tochukwu, MD   650 mg at 10/31/20 7619      Review of Systems: 10 systems reviewed and negative except per interval history/subjective  Physical Exam: Vitals:   10/31/20 0823 10/31/20 1110  BP: 139/78 (!) 154/79  Pulse: 74 74  Resp: 12 20  Temp:  98.3 F (36.8 C)  SpO2: 96% 96%   Total I/O In: -  Out: 500 [Urine:500]  Intake/Output Summary (Last 24 hours) at 10/31/2020 1231 Last data filed at 10/31/2020 1114 Gross per 24 hour  Intake 672.19 ml  Output 1800 ml  Net -1127.81 ml   Constitutional: Thin, frail, lying in bed ENMT: Moist mucous membranes, no nasal discharge CV: normal rate, diffuse pitting edema overall improved Respiratory: Bilateral chest rise, normal work of breathing Gastrointestinal: soft, non-tender, no palpable masses or hernias Skin: Skin thinning with scattered abrasions, otherwise no rashes Psych: alert, judgement/insight appropriate, appropriate mood and affect   Test Results I personally reviewed new and old clinical labs and radiology tests Lab Results  Component Value Date   NA 141 10/31/2020   K 5.0 10/31/2020   CL 108 10/31/2020   CO2 21 (L)  10/31/2020   BUN 94 (H) 10/31/2020   CREATININE 2.36 (H) 10/31/2020   CALCIUM 8.9 10/31/2020   ALBUMIN 2.1 (L) 10/31/2020   PHOS 5.5 (H) 10/31/2020

## 2020-10-31 NOTE — Progress Notes (Addendum)
PROGRESS NOTE    [name]         IOX:[id number] DOB: [birth year] DOA: 10/29/2020 PCP: [name], MD   Brief Narrative:  [name] a 59 y.o.femalewith medical history significant forDM, S/P renal and pancreas transplant about 10 years ago, Stage 3 CKD, HTN, s/p Rt BKA for osteomyelitiswho presents to the emergency room from the subacute rehab center for evaluation of abnormal lab values. Patient noted to have serum creatinine of 3.8 above her baseline of 1.27 from 3 weeks ago,hemoglobin of6.5g/dlcompared to her baseline of 7.7g/dl3 weeks ago. Her stools are brown in color. She denies having any hematemesis, hematochezia or melena stools. She denies any NSAID use.She denies having any nausea, no vomiting, no abdominal pain, no diarrhea, no urinary symptoms. She complains of feeling weak,bloatedand fatigued. Labs show sodium 137, potassium 3.7, chloride 102, bicarb 23, glucose 392, BUN 112, creatinine 3.05, calcium 8.6, white count 8.1, hemoglobin 6.7, hematocrit 22.4, MCV 116, RDW 18.6, platelet count 291. Respiratory viral panel is negative. Twelve-lead EKG reviewed by me shows sinus rhythm.  In the EDPatient noted to have worsening of her renal function with serum creatinine today on admission 3.05 compared to baseline of 1.27, potassium of 5.7, hemoglobin of 6.7 compared to baseline of 7.7 and serum glucose of 392.Patient was treated with [facility] in the emergency room and will be admitted to the hospital for further evaluation.    Assessment & Plan:   Principal Problem:   Acute kidney injury superimposed on CKD ([place]) Active Problems:   Anemia secondary to renal failure   History of simultaneous kidney and pancreas transplant ([place])   Chronic kidney disease (CKD) stage G3a/A1, moderately decreased glomerular filtration rate (GFR) between 45-59 mL/min/1.73 square meter and albuminuria creatinine ratio less than 30 mg/g ([facility])   Hypertension    Diabetes mellitus with hyperglycemia ([place])  Acute kidney injury superimposed on stage IIIB chronic kidney disease Likely secondary to intravascular volume depletion from anemia Hold ACE inhibitors/diuretics as well as other nephrotoxic agents Repeat renal panel continues to improve Nephrology following, appreciate insight and recommendations Recent Labs       Lab Results  Component Value Date   CREATININE 2.36 (H) 10/31/2020   CREATININE 2.79 (H) 10/30/2020   CREATININE 3.05 (H) 10/29/2020      Diffuse anasarca, resolving Most likely secondary to renal disease Protein remains low - encourage increased PO intake Hold off on diuretics as above, defer to nephrology for resumption of medications Labs (Brief)          Component Value Date/Time   NA 141 10/31/2020 0649   K 5.0 10/31/2020 0649   CL 108 10/31/2020 0649   CO2 21 (L) 10/31/2020 0649   GLUCOSE 109 (H) 10/31/2020 0649   BUN 94 (H) 10/31/2020 0649   CREATININE 2.36 (H) 10/31/2020 0649   CALCIUM 8.9 10/31/2020 0649   PROT 4.5 (L) 10/06/2020 0712   ALBUMIN 2.1 (L) 10/31/2020 0649   AST 47 (H) 10/06/2020 0712   ALT 24 10/06/2020 0712   ALKPHOS 63 10/06/2020 0712   BILITOT 0.4 10/06/2020 0712   GFRNONAA 23 (L) 10/31/2020 0649   GFRAA 39 (L) 09/29/2020 2683     Anemia secondary to chronic kidney disease We will obtain Hemoccultto rule out GI source of blood loss Obtain iron panel and ferritin Transfuse 2 units of packed RBC  Pressure ulcer stage III on sacral area and diabetic ulcer on left heel, POA.  Continue supportive care, bandages per nursing protocol  Status post renal and pancreatic transplant Continue prednisone, cyclosporine and azathioprine  Hyperkalemia Most likely related to ACE inhibitor use and worsening renal function Hold both lisinopril and furosemide Patient received Lokelma in the ER Recent Labs       Lab Results  Component Value Date   K 5.0 10/31/2020       Uncontrolled diabetes mellitus with complications of chronic kidney disease stage III and hyperglycemia Continue sliding scale insulin, hypoglycemic protocol, diabetic diet Recent Labs       Lab Results  Component Value Date   HGBA1C 7.2 (H) 09/26/2020     Hypertension Continue metoprolol  Hypothyroidism Continue Synthroid  Depression  Continue sertraline   DVT prophylaxis:SCDs Code Status:Full code Family Communication: None present  Status is: Inpatient  Dispo: The patient is from: Home  Anticipated d/c is to: Home  Anticipated d/c date is: 24 to 48 hours pending clinical course  Patient currently not medically stable for discharge given need for ongoing monitoring of hemoglobin, creatinine and vital signs given acute illnesses as above  Consultants:   Nephrology  Procedures:   None  Antimicrobials:  None indicated  Subjective: No acute issues or events overnight denies nausea, vomiting, diarrhea, constipation, headache, fevers, chills.  Objective:       Vitals:   10/30/20 2314 10/31/20 0403 10/31/20 0823 10/31/20 1110  BP: (!) 145/69 (!) 164/78 139/78 (!) 154/79  Pulse: 71 76 74 74  Resp: 17 18 12 20   Temp: 98.3 F (36.8 C) 98.4 F (36.9 C)  98.3 F (36.8 C)  TempSrc: Oral Oral  Oral  SpO2: 100% 100% 96% 96%  Weight:  60.9 kg      Intake/Output Summary (Last 24 hours) at 10/31/2020 1342 Last data filed at 10/31/2020 1114    Gross per 24 hour  Intake 672.19 ml  Output 1800 ml  Net -1127.81 ml      Filed Weights   10/31/20 0403  Weight: 60.9 kg    Examination:  General exam: Appears calm and comfortable  Respiratory system: Clear to auscultation. Respiratory effort normal. Cardiovascular system: S1 & S2 heard, RRR. No JVD, murmurs, rubs, gallops or clicks. No pedal edema. Gastrointestinal system: Abdomen is nondistended, soft and nontender. No organomegaly or masses felt.  Normal bowel sounds heard. Central nervous system: Alert and oriented. No focal neurological deficits. Extremities: Right BKA. Skin: No rashes, lesions or ulcers Psychiatry: Judgement and insight appear normal. Mood & affect appropriate.   Data Reviewed: I have personally reviewed following labs and imaging studies  CBC: Last Labs          Recent Labs  Lab 10/29/20 1100 10/29/20 1600 10/29/20 2153 10/30/20 0114 10/31/20 0649  WBC 8.1 6.1 11.4* 13.6* 7.6  NEUTROABS 6.4 5.7 9.9*  --   --   HGB 6.7* 7.0* 11.4* 9.9* 9.0*  HCT 22.4* 22.5* 35.1* 30.4* 28.1*  MCV 116.7* 111.4* 102.3* 101.0* 103.3*  PLT 291 227 308 275 232     Basic Metabolic Panel: Last Labs         Recent Labs  Lab 10/29/20 1100 10/29/20 2153 10/30/20 0114 10/31/20 0649  NA 137  --  137 141  K 5.7*  --  5.3* 5.0  CL 102  --  106 108  CO2 23  --  22 21*  GLUCOSE 392*  --  235* 109*  BUN 112*  --  101* 94*  CREATININE 3.05*  --  2.79* 2.36*  CALCIUM 8.6*  --  8.7* 8.9  PHOS  --  7.3*  --  5.5*     GFR: Estimated Creatinine Clearance: 21 mL/min (A) (by C-G formula based on SCr of 2.36 mg/dL (H)). Liver Function Tests: Last Labs       Recent Labs  Lab 10/29/20 2153 10/31/20 0649  ALBUMIN 1.6* 2.1*     Last Labs   No results for input(s): LIPASE, AMYLASE in the last 168 hours.   Last Labs   No results for input(s): AMMONIA in the last 168 hours.   Coagulation Profile: Last Labs   No results for input(s): INR, PROTIME in the last 168 hours.   Cardiac Enzymes: Last Labs   No results for input(s): CKTOTAL, CKMB, CKMBINDEX, TROPONINI in the last 168 hours.   BNP (last 3 results) Recent Labs (within last 365 days)  No results for input(s): PROBNP in the last 8760 hours.   HbA1C: Recent Labs (last 2 labs)   No results for input(s): HGBA1C in the last 72 hours.   CBG: Last Labs          Recent Labs  Lab 10/30/20 1641 10/30/20 2112 10/31/20 0618 10/31/20 1109 10/31/20 1210    GLUCAP 223* 239* 120* 58* 95     Lipid Profile: Recent Labs (last 2 labs)   No results for input(s): CHOL, HDL, LDLCALC, TRIG, CHOLHDL, LDLDIRECT in the last 72 hours.   Thyroid Function Tests: Recent Labs (last 2 labs)   No results for input(s): TSH, T4TOTAL, FREET4, T3FREE, THYROIDAB in the last 72 hours.   Anemia Panel: Recent Labs (last 2 labs)   Recent Labs    10/29/20 2153  FERRITIN 1,032*  TIBC 143*  IRON 95     Sepsis Labs: Last Labs   No results for input(s): PROCALCITON, LATICACIDVEN in the last 168 hours.           Recent Results (from the past 240 hour(s))  Respiratory Panel by RT PCR (Flu A&B, Covid) - Nasopharyngeal Swab     Status: None   Collection Time: 10/29/20 12:58 PM   Specimen: Nasopharyngeal Swab  Result Value Ref Range Status   SARS Coronavirus 2 by RT PCR NEGATIVE NEGATIVE Final    Comment: (NOTE) SARS-CoV-2 target nucleic acids are NOT DETECTED.  The SARS-CoV-2 RNA is generally detectable in upper respiratoy specimens during the acute phase of infection. The lowest concentration of SARS-CoV-2 viral copies this assay can detect is 131 copies/mL. A negative result does not preclude SARS-Cov-2 infection and should not be used as the sole basis for treatment or other patient management decisions. A negative result may occur with  improper specimen collection/handling, submission of specimen other than nasopharyngeal swab, presence of viral mutation(s) within the areas targeted by this assay, and inadequate number of viral copies (<131 copies/mL). A negative result must be combined with clinical observations, patient history, and epidemiological information. The expected result is Negative.  Fact Sheet for Patients:  PinkCheek.be  Fact Sheet for Healthcare Providers:  GravelBags.it  This test is no t yet approved or cleared by the [place] FDA and  has been  authorized for detection and/or diagnosis of SARS-CoV-2 by FDA under an Emergency Use Authorization (EUA). This EUA will remain  in effect (meaning this test can be used) for the duration of the COVID-19 declaration under Section 564(b)(1) of the Act, 21 U.S.C. section 360bbb-3(b)(1), unless the authorization is terminated or revoked sooner.     Influenza A by PCR NEGATIVE NEGATIVE Final   Influenza B by  PCR NEGATIVE NEGATIVE Final    Comment: (NOTE) The Xpert Xpress SARS-CoV-2/FLU/RSV assay is intended as an aid in  the diagnosis of influenza from Nasopharyngeal swab specimens and  should not be used as a sole basis for treatment. Nasal washings and  aspirates are unacceptable for Xpert Xpress SARS-CoV-2/FLU/RSV  testing.  Fact Sheet for Patients: PinkCheek.be  Fact Sheet for Healthcare Providers: GravelBags.it  This test is not yet approved or cleared by the [place] FDA and  has been authorized for detection and/or diagnosis of SARS-CoV-2 by  FDA under an Emergency Use Authorization (EUA). This EUA will remain  in effect (meaning this test can be used) for the duration of the  Covid-19 declaration under Section 564(b)(1) of the Act, 21  U.S.C. section 360bbb-3(b)(1), unless the authorization is  terminated or revoked. Performed at [facility], [place] [address] [place], [place] [address]       Radiology Studies:  Imaging Results (Last 48 hours)  US Renal Transplant w/Doppler  Result Date: 10/29/2020 CLINICAL DATA:  Acute renal insufficiency, renal transplant EXAM: ULTRASOUND OF RENAL TRANSPLANT WITH RENAL DOPPLER ULTRASOUND TECHNIQUE: Ultrasound examination of the renal transplant was performed with gray-scale, color and duplex doppler evaluation. COMPARISON:  09/27/2020 FINDINGS: Transplant kidney location: RLQ Transplant Kidney: Renal measurements: 11.7 x 5.5 x 7.3 cm = volume: 242.73mL.  Echotexture is increased consistent with medical renal disease. There is mild hydronephrosis. No nephrolithiasis or renal mass. Color flow in the main renal artery:  Yes Color flow in the main renal vein:  Yes Duplex Doppler Evaluation: Main Renal Artery Velocity: 38.4 cm/sec Main Renal Artery Resistive Index: 0.71 Venous waveform in main renal vein:  Present Intrarenal resistive index in upper pole:  0.76 (normal 0.6-0.8; equivocal 0.8-0.9; abnormal >= 0.9) Intrarenal resistive index in lower pole: 0.69 (normal 0.6-0.8; equivocal 0.8-0.9; abnormal >= 0.9) Bladder: Bladder is moderately distended, with debris layering dependently. Other findings:  Trace free fluid in the pelvis. IMPRESSION: 1. Increased cortical echotexture within the right lower quadrant transplant kidney, consistent with medical renal disease. 2. Mild hydronephrosis of the right lower quadrant transplant kidney. 3. Normal Doppler interrogation of the transplant kidney. 4. Debris layering dependently within the bladder. Electronically Signed   By: Randa Ngo M.D.   On: 10/29/2020 15:46     Scheduled Meds: . azaTHIOprine  50 mg Oral Daily  . collagenase   Topical Daily  . cycloSPORINE  75 mg Oral BID  . darbepoetin (ARANESP) injection - NON-DIALYSIS  100 mcg Subcutaneous Q Fri-1800  . Gerhardt's butt cream   Topical BID  . heparin injection (subcutaneous)  5,000 Units Subcutaneous Q8H  . insulin aspart  0-15 Units Subcutaneous TID WC  . insulin glargine  18 Units Subcutaneous QHS  . levothyroxine  50 mcg Oral Daily  . metoprolol tartrate  12.5 mg Oral BID  . nutrition supplement (JUVEN)  1 packet Oral BID BM  . predniSONE  5 mg Oral Daily  . rosuvastatin  5 mg Oral QHS  . sertraline  25 mg Oral Daily  . sodium bicarbonate  650 mg Oral BID   Continuous Infusions: . albumin human 25 g (10/31/20 0849)     LOS: 2 days   Time spent: 58min  Leigha Olberding C Domnic Vantol, DO [facility]  If 7PM-7AM, please contact  night-coverage [email or web address]  10/31/2020, 1:42 PM

## 2020-11-01 DIAGNOSIS — N189 Chronic kidney disease, unspecified: Secondary | ICD-10-CM | POA: Diagnosis not present

## 2020-11-01 DIAGNOSIS — N179 Acute kidney failure, unspecified: Secondary | ICD-10-CM | POA: Diagnosis not present

## 2020-11-01 LAB — RENAL FUNCTION PANEL
Albumin: 2.7 g/dL — ABNORMAL LOW (ref 3.5–5.0)
Anion gap: 11 (ref 5–15)
BUN: 90 mg/dL — ABNORMAL HIGH (ref 6–20)
CO2: 23 mmol/L (ref 22–32)
Calcium: 9.1 mg/dL (ref 8.9–10.3)
Chloride: 107 mmol/L (ref 98–111)
Creatinine, Ser: 2.25 mg/dL — ABNORMAL HIGH (ref 0.44–1.00)
GFR, Estimated: 24 mL/min — ABNORMAL LOW (ref >60)
Glucose, Bld: 196 mg/dL — ABNORMAL HIGH (ref 70–99)
Phosphorus: 4.8 mg/dL — ABNORMAL HIGH (ref 2.5–4.6)
Potassium: 4.7 mmol/L (ref 3.5–5.1)
Sodium: 141 mmol/L (ref 135–145)

## 2020-11-01 LAB — CBC
HCT: 28.4 % — ABNORMAL LOW (ref 36.0–46.0)
Hemoglobin: 8.8 g/dL — ABNORMAL LOW (ref 12.0–15.0)
MCH: 32.5 pg (ref 26.0–34.0)
MCHC: 31 g/dL (ref 30.0–36.0)
MCV: 104.8 fL — ABNORMAL HIGH (ref 80.0–100.0)
Platelets: 224 10*3/uL (ref 150–400)
RBC: 2.71 MIL/uL — ABNORMAL LOW (ref 3.87–5.11)
RDW: 20.4 % — ABNORMAL HIGH (ref 11.5–15.5)
WBC: 8.5 10*3/uL (ref 4.0–10.5)
nRBC: 0.2 % (ref 0.0–0.2)

## 2020-11-01 LAB — GLUCOSE, CAPILLARY
Glucose-Capillary: 126 mg/dL — ABNORMAL HIGH (ref 70–99)
Glucose-Capillary: 43 mg/dL — CL (ref 70–99)
Glucose-Capillary: 44 mg/dL — CL (ref 70–99)
Glucose-Capillary: 68 mg/dL — ABNORMAL LOW (ref 70–99)
Glucose-Capillary: 134 mg/dL — ABNORMAL HIGH (ref 70–99)
Glucose-Capillary: 279 mg/dL — ABNORMAL HIGH (ref 70–99)
Glucose-Capillary: 288 mg/dL — ABNORMAL HIGH (ref 70–99)

## 2020-11-01 MED ORDER — INSULIN ASPART 100 UNIT/ML ~~LOC~~ SOLN
0.0000 [IU] | Freq: Three times a day (TID) | SUBCUTANEOUS | Status: DC
  Administered 2020-11-01 – 2020-11-02 (×2): 5 [IU] via SUBCUTANEOUS
  Administered 2020-11-02: 1 [IU] via SUBCUTANEOUS
  Administered 2020-11-03: 2 [IU] via SUBCUTANEOUS
  Administered 2020-11-04 (×2): 1 [IU] via SUBCUTANEOUS
  Administered 2020-11-04 – 2020-11-05 (×2): 2 [IU] via SUBCUTANEOUS
  Administered 2020-11-05: 3 [IU] via SUBCUTANEOUS
  Administered 2020-11-06 (×2): 2 [IU] via SUBCUTANEOUS
  Administered 2020-11-06: 3 [IU] via SUBCUTANEOUS
  Administered 2020-11-07: 2 [IU] via SUBCUTANEOUS
  Administered 2020-11-07: 1 [IU] via SUBCUTANEOUS

## 2020-11-01 MED ORDER — INSULIN GLARGINE 100 UNIT/ML ~~LOC~~ SOLN
6.0000 [IU] | Freq: Two times a day (BID) | SUBCUTANEOUS | Status: DC
  Administered 2020-11-01 – 2020-11-06 (×10): 6 [IU] via SUBCUTANEOUS
  Filled 2020-11-01 (×14): qty 0.06

## 2020-11-01 MED ORDER — GLUCOSE 40 % PO GEL
ORAL | Status: AC
  Administered 2020-11-01: 37.5 g
  Filled 2020-11-01: qty 1

## 2020-11-01 MED ORDER — FUROSEMIDE 10 MG/ML IJ SOLN
60.0000 mg | Freq: Once | INTRAMUSCULAR | Status: AC
  Administered 2020-11-01: 60 mg via INTRAVENOUS
  Filled 2020-11-01: qty 6

## 2020-11-01 MED ORDER — INSULIN GLARGINE 100 UNIT/ML ~~LOC~~ SOLN
22.0000 [IU] | Freq: Every day | SUBCUTANEOUS | Status: DC
  Filled 2020-11-01: qty 0.22

## 2020-11-01 MED ORDER — GLUCOSE 40 % PO GEL
1.0000 | Freq: Once | ORAL | Status: AC
  Administered 2020-11-01: 37.5 g via ORAL

## 2020-11-01 NOTE — Progress Notes (Signed)
Nephrology Follow-Up Consult note   Assessment/Recommendations: [name] is a/an 59 y.o. 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  Continues to improve with creatinine of 2.25 today. -Minimal hydronephrosis in the lower pole of the transplanted kidney on ultrasound.  Repeat imaging of creatinine fails to improve -Management of hypoalbuminemia, anasarca, intravascular volume depletion as below -Continue to monitor daily Cr, Dose meds for GFR -Monitor Daily I/Os, Daily weight  -Currently no indication for HD  Diffuse Anasarca/hypoalbuminemia: Likely associated with chronic inflammation related to infections.  Nutrition may be contributing.  This denotes a poor prognosis -Serum albumin now at goal at 2.7 -Stop IV albumin -Encourage dietary protein -Trial of IV Lasix 60 mg today  Hypertension:  Blood pressure acceptable on current regimen.  Continue to monitor  Hyperkalemia: Resolved  Anemia due to CKD/chronic inflammation: Severe anemia likely secondary to CKD and chronic inflammation as indicated by low albumin and elevated iron saturation status post 2 units of blood. -Aranesp 100 MCG weekly -Patient has been set up to continue Retacrit every 2 weekly outpatient -Transfuse for Hgb<7 g/dL  Uncontrolled Diabetes Mellitus Type 1 with hyperglycemia: Diabetic management per primary team  History of kidney/pancreas transplant: Remote.  Follows with Dr. Cira Servant.  Baseline creatinine around 1.3.  Continue home cyclosporine, aza, and prednisone.    Trough was appropriately drawn on 11/5.  Drawn too early on 11/6.  This will be followed up in the future  Chronic metabolic acidosis: Associated with kidney disease.  Can continue home sodium bicarbonate.  Serum  bicarbonate at goal today at 23  Hyperphosphatemia: Now nearly resolved with phosphorus of 4.8 today given improvement in kidney function   Recommendations conveyed to primary service.    [facility] 11/01/2020 9:46 AM  ___________________________________________________________  CC: Kidney transplant, anasarca, CKD, AKI  Interval History/Subjective: Patient feels well today and denies specific complaints.  She denies nausea, vomiting, abdominal pain.  She feels that her lower extremity weakness is slightly better  Medications:  Current Facility-Administered Medications  Medication Dose Route Frequency Provider Last Rate Last Admin  . acetaminophen (TYLENOL) tablet 650 mg  650 mg Oral Q6H PRN Agbata, Tochukwu, MD      . azaTHIOprine (IMURAN) tablet 50 mg  50 mg Oral Daily [name], MD   50 mg at 10/31/20 1047  . collagenase (SANTYL) ointment   Topical Daily Little Ishikawa, MD   Given at 10/31/20 [phone]  . cycloSPORINE (SANDIMMUNE) capsule 75 mg  75 mg Oral BID [name], MD   75 mg at 11/01/20 0853  . Darbepoetin Alfa (ARANESP) injection 100 mcg  100 mcg Subcutaneous Q [id number] [name], MD   100 mcg at 10/31/20 1827  . Gerhardt's butt cream   Topical BID Little Ishikawa, MD   1 application at 98/11/91 2113  . heparin injection 5,000 Units  5,000 Units Subcutaneous Q8H Little Ishikawa, MD   5,000 Units at 11/01/20 0550  . HYDROcodone-acetaminophen (NORCO/VICODIN) 5-325 MG per tablet 1 tablet  1 tablet Oral Q4H PRN Agbata, Tochukwu, MD   1 tablet at 10/31/20 2228  . insulin aspart (novoLOG) injection 0-15 Units  0-15 Units Subcutaneous TID WC Agbata, Tochukwu, MD   2 Units at 11/01/20 0717  .  insulin glargine (LANTUS) injection 22 Units  22 Units Subcutaneous QHS Little Ishikawa, MD      . levothyroxine (SYNTHROID) tablet 50 mcg  50 mcg Oral Daily Agbata, Tochukwu, MD   50 mcg at 11/01/20 0553  . metoprolol  tartrate (LOPRESSOR) tablet 12.5 mg  12.5 mg Oral BID Agbata, Tochukwu, MD   12.5 mg at 10/31/20 2100  . nutrition supplement (JUVEN) (JUVEN) powder packet 1 packet  1 packet Oral BID BM Agbata, Tochukwu, MD   1 packet at 10/30/20 1214  . ondansetron (ZOFRAN) tablet 4 mg  4 mg Oral Q6H PRN Agbata, Tochukwu, MD       Or  . ondansetron (ZOFRAN) injection 4 mg  4 mg Intravenous Q6H PRN Agbata, Tochukwu, MD      . predniSONE (DELTASONE) tablet 5 mg  5 mg Oral Daily Agbata, Tochukwu, MD   5 mg at 10/31/20 0933  . rosuvastatin (CRESTOR) tablet 5 mg  5 mg Oral QHS Little Ishikawa, MD   5 mg at 10/31/20 2101  . sertraline (ZOLOFT) tablet 25 mg  25 mg Oral Daily Agbata, Tochukwu, MD   25 mg at 10/31/20 0936  . sodium bicarbonate tablet 650 mg  650 mg Oral BID Agbata, Tochukwu, MD   650 mg at 10/31/20 2100      Review of Systems: 10 systems reviewed and negative except per interval history/subjective  Physical Exam: Vitals:   10/31/20 1528 10/31/20 1941  BP: (!) 152/72 (!) 148/79  Pulse:  79  Resp: 20 18  Temp: 98 F (36.7 C) 97.8 F (36.6 C)  SpO2:  100%   No intake/output data recorded.  Intake/Output Summary (Last 24 hours) at 11/01/2020 0946 Last data filed at 11/01/2020 0226 Gross per 24 hour  Intake 480 ml  Output 2300 ml  Net -1820 ml   Constitutional: Thin, frail, lying in bed ENMT: Moist mucous membranes, no nasal discharge CV: normal rate, diffuse pitting edema improved but remains present Respiratory: Bilateral chest rise, normal work of breathing Gastrointestinal: soft, non-tender, no palpable masses or hernias Skin: Skin thinning with scattered abrasions, otherwise no rashes Psych: alert, judgement/insight appropriate, appropriate mood and affect   Test Results I personally reviewed new and old clinical labs and radiology tests Lab Results  Component Value Date   NA 141 11/01/2020   K 4.7 11/01/2020   CL 107 11/01/2020   CO2 23 11/01/2020   BUN 90 (H)  11/01/2020   CREATININE 2.25 (H) 11/01/2020   CALCIUM 9.1 11/01/2020   ALBUMIN 2.7 (L) 11/01/2020   PHOS 4.8 (H) 11/01/2020

## 2020-11-01 NOTE — Progress Notes (Signed)
CRITICAL VALUE ALERT  Critical Value:  CBG 44   Date & Time Notied: 11/01/20 1150  Provider Notified: 11/01/20 1200  Orders Received/Actions taken: Pharmacist/ Dr. Avon Gully, MD collaboration to change to sensitive scale insulin coverage and divided doses of Lantus 6 U.    Hypoglycemia treated with oral glucose (see MAR) repeat CBG 68 at 1228 and CBG 134 at 1308.  Continue to monitor.

## 2020-11-01 NOTE — Progress Notes (Signed)
PT Cancellation Note  Patient Details Name: Britzy Graul MRN: [id number] DOB: 31-Mar-1961   Cancelled Treatment:    Reason Eval/Treat Not Completed: Other (comment).  Refusals for meds that pt is waiting for, then waiting for a tray.  Pt is in bed with comfortable position.  Will re-attempt as time and pt allow.   Ramond Dial 11/01/2020, 2:03 PM   Mee Hives, PT MS Acute Rehab Dept. Number: [place] and [place]

## 2020-11-02 DIAGNOSIS — N189 Chronic kidney disease, unspecified: Secondary | ICD-10-CM | POA: Diagnosis not present

## 2020-11-02 DIAGNOSIS — N179 Acute kidney failure, unspecified: Secondary | ICD-10-CM | POA: Diagnosis not present

## 2020-11-02 LAB — CBC
HCT: 29.9 % — ABNORMAL LOW (ref 36.0–46.0)
Hemoglobin: 9.4 g/dL — ABNORMAL LOW (ref 12.0–15.0)
MCH: 33.1 pg (ref 26.0–34.0)
MCHC: 31.4 g/dL (ref 30.0–36.0)
MCV: 105.3 fL — ABNORMAL HIGH (ref 80.0–100.0)
Platelets: 231 10*3/uL (ref 150–400)
RBC: 2.84 MIL/uL — ABNORMAL LOW (ref 3.87–5.11)
RDW: 19.7 % — ABNORMAL HIGH (ref 11.5–15.5)
WBC: 8.6 10*3/uL (ref 4.0–10.5)
nRBC: 0 % (ref 0.0–0.2)

## 2020-11-02 LAB — RENAL FUNCTION PANEL
Albumin: 2.2 g/dL — ABNORMAL LOW (ref 3.5–5.0)
Anion gap: 14 (ref 5–15)
BUN: 96 mg/dL — ABNORMAL HIGH (ref 6–20)
CO2: 21 mmol/L — ABNORMAL LOW (ref 22–32)
Calcium: 9.2 mg/dL (ref 8.9–10.3)
Chloride: 102 mmol/L (ref 98–111)
Creatinine, Ser: 2.46 mg/dL — ABNORMAL HIGH (ref 0.44–1.00)
GFR, Estimated: 22 mL/min — ABNORMAL LOW (ref >60)
Glucose, Bld: 314 mg/dL — ABNORMAL HIGH (ref 70–99)
Phosphorus: 4.1 mg/dL (ref 2.5–4.6)
Potassium: 5.1 mmol/L (ref 3.5–5.1)
Sodium: 137 mmol/L (ref 135–145)

## 2020-11-02 LAB — GLUCOSE, CAPILLARY
Glucose-Capillary: 270 mg/dL — ABNORMAL HIGH (ref 70–99)
Glucose-Capillary: 131 mg/dL — ABNORMAL HIGH (ref 70–99)
Glucose-Capillary: 129 mg/dL — ABNORMAL HIGH (ref 70–99)
Glucose-Capillary: 134 mg/dL — ABNORMAL HIGH (ref 70–99)
Glucose-Capillary: 204 mg/dL — ABNORMAL HIGH (ref 70–99)

## 2020-11-02 MED ORDER — ALBUMIN HUMAN 25 % IV SOLN
25.0000 g | Freq: Once | INTRAVENOUS | Status: AC
  Administered 2020-11-02: 25 g via INTRAVENOUS
  Filled 2020-11-02: qty 100

## 2020-11-02 MED ORDER — FUROSEMIDE 10 MG/ML IJ SOLN
80.0000 mg | Freq: Once | INTRAMUSCULAR | Status: AC
  Administered 2020-11-02: 80 mg via INTRAVENOUS
  Filled 2020-11-02: qty 8

## 2020-11-02 NOTE — Progress Notes (Signed)
PROGRESS NOTE    [name]  GUR:[id number] DOB: 13-May-1961 DOA: 10/29/2020 PCP: [name], MD   Brief Narrative:  [name] is a 59 y.o. female with medical history significant for DM, S/P renal and pancreas transplant about 10 years ago, Stage 3 CKD, HTN, s/p Rt BKA for osteomyelitis who presents to the emergency room from the subacute rehab center for evaluation of abnormal lab values.  Patient noted to have serum creatinine of 3.8 above her baseline of 1.27 from 3 weeks ago, hemoglobin of 6.5g/dl compared to her baseline of 7.7g/dl 3 weeks ago. Her stools are brown in color. She denies having any hematemesis, hematochezia or melena stools. She denies any NSAID use.  She denies having any nausea, no vomiting, no abdominal pain, no diarrhea, no urinary symptoms. She complains of feeling weak, bloated and fatigued. Labs show sodium 137, potassium 3.7, chloride 102, bicarb 23, glucose 392, BUN 112, creatinine 3.05, calcium 8.6, white count 8.1, hemoglobin 6.7, hematocrit 22.4, MCV 116, RDW 18.6, platelet count 291. Respiratory viral panel is negative. Twelve-lead EKG reviewed by me shows sinus rhythm.  In the ED Patient noted to have worsening of her renal function with serum creatinine today on admission 3.05 compared to baseline of 1.27, potassium of 5.7, hemoglobin of 6.7 compared to baseline of 7.7 and serum glucose of 392.  Patient was treated with [facility] in the emergency room and will be admitted to the hospital for further evaluation.  Creatinine improving slowly with supportive care while holding diuretics. Anemia downtrending slowly, patient continues to have worsening hypoglycemia, will decrease insulin and continue to follow clinically along with nephrology as patient continues to improve.  Ultimately disposition is likely to be SNF per PT recommendations.  We will work with case management for hopeful discharge to SNF in the next 3 to 4 days pending clinical course and  correction of lab abnormalities as outlined below.  Assessment & Plan:   Principal Problem:   Acute kidney injury superimposed on CKD ([place]) Active Problems:   Anemia secondary to renal failure   History of simultaneous kidney and pancreas transplant ([place])   Chronic kidney disease (CKD) stage G3a/A1, moderately decreased glomerular filtration rate (GFR) between 45-59 mL/min/1.73 square meter and albuminuria creatinine ratio less than 30 mg/g ([facility])   Hypertension   Diabetes mellitus with hyperglycemia ([place])  Acute kidney injury superimposed on stage IIIB chronic kidney disease Likely secondary to intravascular volume depletion from anemia Hold ACE inhibitors/diuretics as well as other nephrotoxic agents Repeat renal panel continues to improve Nephrology following, appreciate insight and recommendations Lab Results  Component Value Date   CREATININE 2.46 (H) 11/02/2020   CREATININE 2.25 (H) 11/01/2020   CREATININE 2.36 (H) 10/31/2020    Diffuse anasarca, resolving Most likely secondary to renal disease Protein remains low - encourage increased PO intake Hold off on diuretics as above, defer to nephrology for resumption of medications    Component Value Date/Time   NA 137 11/02/2020 0506   K 5.1 11/02/2020 0506   CL 102 11/02/2020 0506   CO2 21 (L) 11/02/2020 0506   GLUCOSE 314 (H) 11/02/2020 0506   BUN 96 (H) 11/02/2020 0506   CREATININE 2.46 (H) 11/02/2020 0506   CALCIUM 9.2 11/02/2020 0506   PROT 4.5 (L) 10/06/2020 0712   ALBUMIN 2.2 (L) 11/02/2020 0506   AST 47 (H) 10/06/2020 0712   ALT 24 10/06/2020 0712   ALKPHOS 63 10/06/2020 0712   BILITOT 0.4 10/06/2020 0712   GFRNONAA 22 (  L) 11/02/2020 0506   GFRAA 39 (L) 09/29/2020 7408   Anemia secondary to chronic kidney disease, POA Iron panel confirms not iron deficiency -likely anemia of chronic disease, continue supportive care, nephrology following -defer use of EPO or similar to their expertise    Component Value  Date/Time   IRON 95 10/29/2020 2153   TIBC 143 (L) 10/29/2020 2153   FERRITIN 1,032 (H) 10/29/2020 2153   IRONPCTSAT 67 (H) 10/29/2020 2153  Status post 2 units of packed RBC given at admission  Pressure ulcer stage III on sacral area and diabetic ulcer on left heel, POA.  Continue supportive care, bandages per nursing protocol  Status post renal and pancreatic transplant Continue prednisone, cyclosporine and azathioprine  Hyperkalemia, resolved Most likely related to ACE inhibitor use and worsening renal function Holding both lisinopril and furosemide in the setting of above Patient received Lokelma in the ER x1 Lab Results  Component Value Date   K 5.1 11/02/2020   Uncontrolled diabetes mellitus with complications of chronic kidney disease stage III and hyperglycemia Continue sliding scale insulin, hypoglycemic protocol, diabetic diet Lab Results  Component Value Date   HGBA1C 7.2 (H) 09/26/2020   Hypertension Continue metoprolol  Hypothyroidism Continue Synthroid  Depression  Continue sertraline   DVT prophylaxis: SCDs Code Status: Full code Family Communication: None present  Status is: Inpatient  Dispo: The patient is from: Home              Anticipated d/c is to: SNF - per PT recommendations              Anticipated d/c date is: 24 to 48 hours pending clinical course              Patient currently not medically stable for discharge given need for ongoing monitoring of hemoglobin, creatinine and vital signs given acute illnesses as above  Consultants:   Nephrology  Procedures:   None  Antimicrobials:  None indicated  Subjective: No acute issues or events overnight denies nausea, vomiting, diarrhea, constipation, headache, fevers, chills.  Objective: Vitals:   11/01/20 1613 11/01/20 1953 11/01/20 2350 11/02/20 0425  BP: (!) 174/85 124/80 133/80 (!) 154/82  Pulse: 73 87 81 77  Resp: 20 18 13 18   Temp: 98.4 F (36.9 C) 99.1 F (37.3 C)  98.9 F (37.2 C) 97.7 F (36.5 C)  TempSrc: Oral Oral Oral Oral  SpO2: 100% 97% 95% 95%  Weight:    64.4 kg    Intake/Output Summary (Last 24 hours) at 11/02/2020 0745 Last data filed at 11/01/2020 2226 Gross per 24 hour  Intake 129 ml  Output 1100 ml  Net -971 ml   Filed Weights   10/31/20 0403 11/02/20 0425  Weight: 60.9 kg 64.4 kg    Examination:  General exam: Appears calm and comfortable  Respiratory system: Clear to auscultation. Respiratory effort normal. Cardiovascular system: S1 & S2 heard, RRR. No JVD, murmurs, rubs, gallops or clicks. No pedal edema. Gastrointestinal system: Abdomen is nondistended, soft and nontender. No organomegaly or masses felt. Normal bowel sounds heard. Central nervous system: Alert and oriented. No focal neurological deficits. Extremities: Right BKA. Skin: No rashes, lesions or ulcers Psychiatry: Judgement and insight appear normal. Mood & affect appropriate.   Data Reviewed: I have personally reviewed following labs and imaging studies  CBC: Recent Labs  Lab 10/29/20 1100 10/29/20 1100 10/29/20 1600 10/29/20 1600 10/29/20 2153 10/30/20 0114 10/31/20 0649 11/01/20 0220 11/02/20 0506  WBC 8.1   < > 6.1   < >  11.4* 13.6* 7.6 8.5 8.6  NEUTROABS 6.4  --  5.7  --  9.9*  --   --   --   --   HGB 6.7*   < > 7.0*   < > 11.4* 9.9* 9.0* 8.8* 9.4*  HCT 22.4*   < > 22.5*   < > 35.1* 30.4* 28.1* 28.4* 29.9*  MCV 116.7*   < > 111.4*   < > 102.3* 101.0* 103.3* 104.8* 105.3*  PLT 291   < > 227   < > 308 275 232 224 231   < > = values in this interval not displayed.   Basic Metabolic Panel: Recent Labs  Lab 10/29/20 1100 10/29/20 2153 10/30/20 0114 10/31/20 0649 11/01/20 0220 11/02/20 0506  NA 137  --  137 141 141 137  K 5.7*  --  5.3* 5.0 4.7 5.1  CL 102  --  106 108 107 102  CO2 23  --  22 21* 23 21*  GLUCOSE 392*  --  235* 109* 196* 314*  BUN 112*  --  101* 94* 90* 96*  CREATININE 3.05*  --  2.79* 2.36* 2.25* 2.46*  CALCIUM 8.6*   --  8.7* 8.9 9.1 9.2  PHOS  --  7.3*  --  5.5* 4.8* 4.1   GFR: Estimated Creatinine Clearance: 22 mL/min (A) (by C-G formula based on SCr of 2.46 mg/dL (H)). Liver Function Tests: Recent Labs  Lab 10/29/20 2153 10/31/20 0649 11/01/20 0220 11/02/20 0506  ALBUMIN 1.6* 2.1* 2.7* 2.2*   No results for input(s): LIPASE, AMYLASE in the last 168 hours. No results for input(s): AMMONIA in the last 168 hours. Coagulation Profile: No results for input(s): INR, PROTIME in the last 168 hours. Cardiac Enzymes: No results for input(s): CKTOTAL, CKMB, CKMBINDEX, TROPONINI in the last 168 hours. BNP (last 3 results) No results for input(s): PROBNP in the last 8760 hours. HbA1C: No results for input(s): HGBA1C in the last 72 hours. CBG: Recent Labs  Lab 11/01/20 1228 11/01/20 1308 11/01/20 1654 11/01/20 2040 11/02/20 0610  GLUCAP 68* 134* 279* 288* 270*   Lipid Profile: No results for input(s): CHOL, HDL, LDLCALC, TRIG, CHOLHDL, LDLDIRECT in the last 72 hours. Thyroid Function Tests: No results for input(s): TSH, T4TOTAL, FREET4, T3FREE, THYROIDAB in the last 72 hours. Anemia Panel: No results for input(s): VITAMINB12, FOLATE, FERRITIN, TIBC, IRON, RETICCTPCT in the last 72 hours. Sepsis Labs: No results for input(s): PROCALCITON, LATICACIDVEN in the last 168 hours.  Recent Results (from the past 240 hour(s))  Respiratory Panel by RT PCR (Flu A&B, Covid) - Nasopharyngeal Swab     Status: None   Collection Time: 10/29/20 12:58 PM   Specimen: Nasopharyngeal Swab  Result Value Ref Range Status   SARS Coronavirus 2 by RT PCR NEGATIVE NEGATIVE Final    Comment: (NOTE) SARS-CoV-2 target nucleic acids are NOT DETECTED.  The SARS-CoV-2 RNA is generally detectable in upper respiratoy specimens during the acute phase of infection. The lowest concentration of SARS-CoV-2 viral copies this assay can detect is 131 copies/mL. A negative result does not preclude SARS-Cov-2 infection and should  not be used as the sole basis for treatment or other patient management decisions. A negative result may occur with  improper specimen collection/handling, submission of specimen other than nasopharyngeal swab, presence of viral mutation(s) within the areas targeted by this assay, and inadequate number of viral copies (<131 copies/mL). A negative result must be combined with clinical observations, patient history, and epidemiological information. The expected  result is Negative.  Fact Sheet for Patients:  PinkCheek.be  Fact Sheet for Healthcare Providers:  GravelBags.it  This test is no t yet approved or cleared by the [place] FDA and  has been authorized for detection and/or diagnosis of SARS-CoV-2 by FDA under an Emergency Use Authorization (EUA). This EUA will remain  in effect (meaning this test can be used) for the duration of the COVID-19 declaration under Section 564(b)(1) of the Act, 21 U.S.C. section 360bbb-3(b)(1), unless the authorization is terminated or revoked sooner.     Influenza A by PCR NEGATIVE NEGATIVE Final   Influenza B by PCR NEGATIVE NEGATIVE Final    Comment: (NOTE) The Xpert Xpress SARS-CoV-2/FLU/RSV assay is intended as an aid in  the diagnosis of influenza from Nasopharyngeal swab specimens and  should not be used as a sole basis for treatment. Nasal washings and  aspirates are unacceptable for Xpert Xpress SARS-CoV-2/FLU/RSV  testing.  Fact Sheet for Patients: PinkCheek.be  Fact Sheet for Healthcare Providers: GravelBags.it  This test is not yet approved or cleared by the [place] FDA and  has been authorized for detection and/or diagnosis of SARS-CoV-2 by  FDA under an Emergency Use Authorization (EUA). This EUA will remain  in effect (meaning this test can be used) for the duration of the  Covid-19 declaration under  Section 564(b)(1) of the Act, 21  U.S.C. section 360bbb-3(b)(1), unless the authorization is  terminated or revoked. Performed at [facility], [place] [address] [place], [place] [address]       Radiology Studies: No results found.  Scheduled Meds: . azaTHIOprine  50 mg Oral Daily  . collagenase   Topical Daily  . cycloSPORINE  75 mg Oral BID  . darbepoetin (ARANESP) injection - NON-DIALYSIS  100 mcg Subcutaneous Q Fri-1800  . furosemide  80 mg Intravenous Once  . Gerhardt's butt cream   Topical BID  . heparin injection (subcutaneous)  5,000 Units Subcutaneous Q8H  . insulin aspart  0-9 Units Subcutaneous TID WC  . insulin glargine  6 Units Subcutaneous BID  . levothyroxine  50 mcg Oral Daily  . metoprolol tartrate  12.5 mg Oral BID  . nutrition supplement (JUVEN)  1 packet Oral BID BM  . predniSONE  5 mg Oral Daily  . rosuvastatin  5 mg Oral QHS  . sertraline  25 mg Oral Daily  . sodium bicarbonate  650 mg Oral BID   Continuous Infusions: . albumin human       LOS: 4 days   Time spent: 18min  [name] C Raad Clayson, DO [facility]  If 7PM-7AM, please contact night-coverage [email or web address]  11/02/2020, 7:45 AM

## 2020-11-02 NOTE — Evaluation (Signed)
Physical Therapy Evaluation Patient Details Name: [name] MRN: [id number] DOB: May 15, 1961 Today's Date: 11/02/2020   History of Present Illness  59 y.o. 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   Clinical Impression  Pt admitted with above diagnosis.  On eval, pt with severe deconditioning. She is unable to lift RLE or LLE against gravity and unable to tolerate pushing through LLE to attempt stand or transfer. Also with decreased ROM L knee and ankle. Pt with poor tolerance for sitting EOB without support, loses balance posterior with inability to self correct. Pt incontinent of stool and urine on eval. Pt expresses desire to mobilize and get more rehab than she has had so far. She does not want to go back to previous SNF. Pt currently with functional limitations due to the deficits listed below (see PT Problem List). Pt will benefit from skilled PT to increase their independence and safety with mobility to allow discharge to the venue listed below.       Follow Up Recommendations SNF    Equipment Recommendations  Wheelchair (measurements PT);Wheelchair cushion (measurements PT)    Recommendations for Other Services       Precautions / Restrictions Precautions Precautions: Fall Restrictions Weight Bearing Restrictions: Yes RLE Weight Bearing: Non weight bearing Other Position/Activity Restrictions: L foot ulcer to heal.  no WB orders.  Patient stating she can bear weight through L leg      Mobility  Bed Mobility Overal bed mobility: Needs Assistance Bed Mobility: Rolling;Supine to Sit;Sit to Supine Rolling: Mod assist   Supine to sit: Max assist Sit to supine: Max assist   General bed mobility comments: pt impulsive EOB, would throw self bkwds unexpectedly, poor positional awareness and corrective  reactions    Transfers                 General transfer comment: pt thought she could pivot to chair but when LLE tested, she was unable to move it against gravity or push with it at all, insufficient strength for transfers. Will need lift to chair  Ambulation/Gait             General Gait Details: unable  Stairs            Wheelchair Mobility    Modified Rankin (Stroke Patients Only)       Balance Overall balance assessment: Needs assistance Sitting-balance support: Bilateral upper extremity supported Sitting balance-Leahy Scale: Poor Sitting balance - Comments: posterior LOB with no self correction Postural control: Posterior lean                                   Pertinent Vitals/Pain Pain Assessment: Faces Faces Pain Scale: Hurts even more Pain Location: RLE, buttocks Pain Descriptors / Indicators: Aching;Guarding Pain Intervention(s): Limited activity within patient's tolerance;Monitored during session    Home Living Family/patient expects to be discharged to:: Skilled nursing facility Living Arrangements: Spouse/significant other Available Help at Discharge: Family;Available PRN/intermittently Type of Home: House Home Access: Level entry     Home Layout: One level Home Equipment: Walker - 4 wheels;Cane - single point Additional Comments: Pt reports frequent falls at home    Prior Function Level of Independence: Needs assistance   Gait / Transfers Assistance Needed: Limited ambulation with rollator prior to  amputation  ADL's / Homemaking Assistance Needed: was assisting with meals, home mangement, cared for dog, and own meds.  States that the last few weeks were getting harding due to swelling to both legs and pain associated with R leg.  Falling more.  Comments: Pt reports when infection got bad, she crawled around in house     Hand Dominance   Dominant Hand: Right    Extremity/Trunk Assessment   Upper Extremity  Assessment Upper Extremity Assessment: Defer to OT evaluation    Lower Extremity Assessment Lower Extremity Assessment: Generalized weakness;RLE deficits/detail;LLE deficits/detail RLE Deficits / Details: R BKA. hip flex 2-/5, hip abd 2-/5 RLE Sensation: decreased proprioception RLE Coordination: decreased gross motor LLE Deficits / Details: hip flex 2-/5, knee ext 2-/5, L heel with wound dressing. Decreased ROM noted at ankle and knee LLE Sensation: decreased light touch;decreased proprioception LLE Coordination: decreased gross motor    Cervical / Trunk Assessment Cervical / Trunk Assessment: Kyphotic  Communication   Communication: No difficulties  Cognition Arousal/Alertness: Awake/alert Behavior During Therapy: WFL for tasks assessed/performed Overall Cognitive Status: No family/caregiver present to determine baseline cognitive functioning                                 General Comments: unable to give detailed timeline of recent events and seems to have decreased awareness of deficits      General Comments General comments (skin integrity, edema, etc.): pt continuously incontinent of urine and stool today. Cleaned up and placed on clean linens but still going.     Exercises     Assessment/Plan    PT Assessment Patient needs continued PT services  PT Problem List Decreased strength;Decreased range of motion;Decreased activity tolerance;Decreased balance;Decreased mobility;Decreased coordination;Decreased cognition;Decreased knowledge of use of DME;Decreased safety awareness;Decreased knowledge of precautions;Pain;Decreased skin integrity       PT Treatment Interventions DME instruction;Functional mobility training;Therapeutic activities;Therapeutic exercise;Balance training;Neuromuscular re-education;Cognitive remediation;Patient/family education    PT Goals (Current goals can be found in the Care Plan section)  Acute Rehab PT Goals Patient Stated Goal:  I want to move better so I can eventually walk again. PT Goal Formulation: With patient Time For Goal Achievement: 11/16/20 Potential to Achieve Goals: Fair    Frequency Min 2X/week   Barriers to discharge        Co-evaluation               AM-PAC PT "6 Clicks" Mobility  Outcome Measure Help needed turning from your back to your side while in a flat bed without using bedrails?: A Lot Help needed moving from lying on your back to sitting on the side of a flat bed without using bedrails?: A Lot Help needed moving to and from a bed to a chair (including a wheelchair)?: Total Help needed standing up from a chair using your arms (e.g., wheelchair or bedside chair)?: Total Help needed to walk in hospital room?: Total Help needed climbing 3-5 steps with a railing? : Total 6 Click Score: 8    End of Session   Activity Tolerance: Patient limited by fatigue Patient left: in bed;with call bell/phone within reach;with bed alarm set Nurse Communication: Mobility status PT Visit Diagnosis: Muscle weakness (generalized) (M62.81);History of falling (Z91.81);Pain Pain - Right/Left: Right Pain - part of body: Leg    Time: 6378-5885 PT Time Calculation (min) (ACUTE ONLY): 28 min   Charges:   PT Evaluation $PT Eval Moderate Complexity: 1  Mod PT Treatments $Therapeutic Activity: 8-22 mins        Leighton Roach, PT  Acute Rehab Services  Pager [phone] Office [place] 11/02/2020, 2:08 PM

## 2020-11-02 NOTE — Progress Notes (Signed)
Nephrology Follow-Up Consult note   Assessment/Recommendations: [name] is a/an 58 y.o. 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  Has diffuse anasarca as below.  Creatinine slightly higher today -Minimal hydronephrosis in the lower pole of the transplanted kidney on ultrasound.  Repeat imaging of creatinine fails to improve -Management of hypoalbuminemia, anasarca, intravascular volume depletion as below -Continue to monitor daily Cr, Dose meds for GFR -Monitor Daily I/Os, Daily weight  -Currently no indication for HD  Diffuse Anasarca/hypoalbuminemia: Likely associated with chronic inflammation related to infections.  Nutrition may be contributing.  This denotes a poor prognosis.  Albumin was slightly lower today at 2.2.  Poor response to IV Lasix yesterday -Trial IV Lasix 80 mg with 25 g of 25% albumin -Encourage dietary protein -We will restart oral diuretics when able  Hypertension:  Blood pressure acceptable on current regimen.  Continue to monitor  Anemia due to CKD/chronic inflammation: Severe anemia likely secondary to CKD and chronic inflammation as indicated by low albumin and elevated iron saturation status post 2 units of blood. -Aranesp 100 MCG weekly -Patient has been set up to continue Retacrit every 2 weekly outpatient -Transfuse for Hgb<7 g/dL  Uncontrolled Diabetes Mellitus Type 1 with hyperglycemia: Diabetic management per primary team  History of kidney/pancreas transplant: Remote.  Follows with Dr. Cira Servant.  Baseline creatinine around 1.3.  Continue home cyclosporine, aza, and prednisone.    Trough was appropriately drawn on 11/5.  Drawn too early on 11/6.  This will be followed up in the future  Chronic metabolic  acidosis: Associated with kidney disease.  Can continue home sodium bicarbonate.    Recommendations conveyed to primary service.    [facility] 11/02/2020 8:17 AM  ___________________________________________________________  CC: Kidney transplant, anasarca, CKD, AKI  Interval History/Subjective: Patient feels well today without any complaints.  Feels that her legs were uncomfortable last night but improved this morning.  Medications:  Current Facility-Administered Medications  Medication Dose Route Frequency Provider Last Rate Last Admin  . acetaminophen (TYLENOL) tablet 650 mg  650 mg Oral Q6H PRN Agbata, Tochukwu, MD      . albumin human 25 % solution 25 g  25 g Intravenous Once [name], MD      . azaTHIOprine Ilean Skill) tablet 50 mg  50 mg Oral Daily [name], MD   50 mg at 11/01/20 1044  . collagenase (SANTYL) ointment   Topical Daily Little Ishikawa, MD   Given at 11/01/20 1029  . cycloSPORINE (SANDIMMUNE) capsule 75 mg  75 mg Oral BID [name], MD   75 mg at 11/01/20 2055  . Darbepoetin Alfa (ARANESP) injection 100 mcg  100 mcg Subcutaneous Q Fri-1800 [name], MD   100 mcg at 10/31/20 1827  . furosemide (LASIX) injection 80 mg  80 mg Intravenous Once [name], MD      . Gerhardt's butt cream   Topical BID Little Ishikawa, MD   1 application at 21/30/86 2124  . heparin injection 5,000 Units  5,000 Units Subcutaneous Q8H Little Ishikawa, MD   5,000 Units at 11/02/20 0645  . HYDROcodone-acetaminophen (NORCO/VICODIN) 5-325 MG per tablet 1 tablet  1 tablet Oral Q4H PRN Agbata, Tochukwu, MD   1  tablet at 11/02/20 0433  . insulin aspart (novoLOG) injection 0-9 Units  0-9 Units Subcutaneous TID WC Little Ishikawa, MD   5 Units at 11/02/20 0645  . insulin glargine (LANTUS) injection 6 Units  6 Units Subcutaneous BID Little Ishikawa, MD   6 Units at 11/01/20 2250  . levothyroxine  (SYNTHROID) tablet 50 mcg  50 mcg Oral Daily Agbata, Tochukwu, MD   50 mcg at 11/02/20 0645  . metoprolol tartrate (LOPRESSOR) tablet 12.5 mg  12.5 mg Oral BID Agbata, Tochukwu, MD   12.5 mg at 11/01/20 2119  . nutrition supplement (JUVEN) (JUVEN) powder packet 1 packet  1 packet Oral BID BM Agbata, Tochukwu, MD   1 packet at 10/30/20 1214  . ondansetron (ZOFRAN) tablet 4 mg  4 mg Oral Q6H PRN Agbata, Tochukwu, MD       Or  . ondansetron (ZOFRAN) injection 4 mg  4 mg Intravenous Q6H PRN Agbata, Tochukwu, MD      . predniSONE (DELTASONE) tablet 5 mg  5 mg Oral Daily Agbata, Tochukwu, MD   5 mg at 11/01/20 1028  . rosuvastatin (CRESTOR) tablet 5 mg  5 mg Oral QHS Little Ishikawa, MD   5 mg at 11/01/20 2119  . sertraline (ZOLOFT) tablet 25 mg  25 mg Oral Daily Agbata, Tochukwu, MD   25 mg at 11/01/20 1030  . sodium bicarbonate tablet 650 mg  650 mg Oral BID Agbata, Tochukwu, MD   650 mg at 11/01/20 2120      Review of Systems: 10 systems reviewed and negative except per interval history/subjective  Physical Exam: Vitals:   11/02/20 0425 11/02/20 0757  BP: (!) 154/82 134/70  Pulse: 77 74  Resp: 18 19  Temp: 97.7 F (36.5 C) 97.6 F (36.4 C)  SpO2: 95% 94%   No intake/output data recorded.  Intake/Output Summary (Last 24 hours) at 11/02/2020 0817 Last data filed at 11/01/2020 2226 Gross per 24 hour  Intake 129 ml  Output 500 ml  Net -371 ml   Constitutional: Thin, frail, lying in bed ENMT: Moist mucous membranes, no nasal discharge CV: normal rate, diffuse pitting edema improved but remains present Respiratory: Bilateral chest rise, normal work of breathing Gastrointestinal: soft, non-tender, no palpable masses or hernias Skin: Skin thinning with scattered abrasions, otherwise no rashes Psych: alert, judgement/insight appropriate, appropriate mood and affect   Test Results I personally reviewed new and old clinical labs and radiology tests Lab Results  Component Value  Date   NA 137 11/02/2020   K 5.1 11/02/2020   CL 102 11/02/2020   CO2 21 (L) 11/02/2020   BUN 96 (H) 11/02/2020   CREATININE 2.46 (H) 11/02/2020   CALCIUM 9.2 11/02/2020   ALBUMIN 2.2 (L) 11/02/2020   PHOS 4.1 11/02/2020

## 2020-11-02 NOTE — Progress Notes (Signed)
PROGRESS NOTE    [name]  VXB:[id number] DOB: September 01, 1961 DOA: 10/29/2020 PCP: [name], MD   Brief Narrative:  [name] is a 59 y.o. female with medical history significant for DM, S/P renal and pancreas transplant about 10 years ago, Stage 3 CKD, HTN, s/p Rt BKA for osteomyelitis who presents to the emergency room from the subacute rehab center for evaluation of abnormal lab values.  Patient noted to have serum creatinine of 3.8 above her baseline of 1.27 from 3 weeks ago, hemoglobin of 6.5g/dl compared to her baseline of 7.7g/dl 3 weeks ago. Her stools are brown in color. She denies having any hematemesis, hematochezia or melena stools. She denies any NSAID use.  She denies having any nausea, no vomiting, no abdominal pain, no diarrhea, no urinary symptoms. She complains of feeling weak, bloated and fatigued. Labs show sodium 137, potassium 3.7, chloride 102, bicarb 23, glucose 392, BUN 112, creatinine 3.05, calcium 8.6, white count 8.1, hemoglobin 6.7, hematocrit 22.4, MCV 116, RDW 18.6, platelet count 291. Respiratory viral panel is negative. Twelve-lead EKG reviewed by me shows sinus rhythm.  In the ED Patient noted to have worsening of her renal function with serum creatinine today on admission 3.05 compared to baseline of 1.27, potassium of 5.7, hemoglobin of 6.7 compared to baseline of 7.7 and serum glucose of 392.  Patient was treated with [facility] in the emergency room and will be admitted to the hospital for further evaluation.  Creatinine improving slowly with supportive care while holding diuretics. Anemia downtrending slowly, patient continues to have worsening hypoglycemia, will decrease insulin and continue to follow clinically along with nephrology as patient continues to improve.  Ultimately disposition is likely to be SNF per PT recommendations.  We will work with case management for hopeful discharge to SNF in the next 3 to 4 days pending clinical course and  correction of lab abnormalities as outlined below.  Assessment & Plan:   Principal Problem:   Acute kidney injury superimposed on CKD ([place]) Active Problems:   Anemia secondary to renal failure   History of simultaneous kidney and pancreas transplant ([place])   Chronic kidney disease (CKD) stage G3a/A1, moderately decreased glomerular filtration rate (GFR) between 45-59 mL/min/1.73 square meter and albuminuria creatinine ratio less than 30 mg/g ([facility])   Hypertension   Diabetes mellitus with hyperglycemia ([place])   Acute kidney injury superimposed on stage IIIA chronic kidney disease Likely secondary to intravascular volume depletion from anemia Hold ACE inhibitors/diuretics as well as other nephrotoxic agents Repeat renal panel continues to improve Nephrology following, appreciate insight and recommendations - clarified to CKD3A (not 3B as previously noted) Lab Results  Component Value Date   CREATININE 2.46 (H) 11/02/2020   CREATININE 2.25 (H) 11/01/2020   CREATININE 2.36 (H) 10/31/2020    Diffuse anasarca, resolving Most likely secondary to renal disease Protein remains low - encourage increased PO intake Hold off on diuretics as above, defer to nephrology for resumption of medications    Component Value Date/Time   NA 137 11/02/2020 0506   K 5.1 11/02/2020 0506   CL 102 11/02/2020 0506   CO2 21 (L) 11/02/2020 0506   GLUCOSE 314 (H) 11/02/2020 0506   BUN 96 (H) 11/02/2020 0506   CREATININE 2.46 (H) 11/02/2020 0506   CALCIUM 9.2 11/02/2020 0506   PROT 4.5 (L) 10/06/2020 0712   ALBUMIN 2.2 (L) 11/02/2020 0506   AST 47 (H) 10/06/2020 0712   ALT 24 10/06/2020 0712   ALKPHOS 63 10/06/2020 3300  BILITOT 0.4 10/06/2020 0712   GFRNONAA 22 (L) 11/02/2020 0506   GFRAA 39 (L) 09/29/2020 2094   Anemia secondary to chronic kidney disease, POA Iron panel confirms not iron deficiency -likely anemia of chronic disease, continue supportive care, nephrology following -defer use of EPO or  similar to their expertise    Component Value Date/Time   IRON 95 10/29/2020 2153   TIBC 143 (L) 10/29/2020 2153   FERRITIN 1,032 (H) 10/29/2020 2153   IRONPCTSAT 67 (H) 10/29/2020 2153  Status post 2 units of packed RBC given at admission  Pressure ulcer stage III on sacral area and diabetic ulcer on left heel, POA.  Continue supportive care, bandages per nursing protocol  Status post renal and pancreatic transplant Continue prednisone, cyclosporine and azathioprine  Hyperkalemia, resolved Most likely related to ACE inhibitor use and worsening renal function Holding both lisinopril and furosemide in the setting of above Patient received Lokelma in the ER x1 Lab Results  Component Value Date   K 5.1 11/02/2020   Uncontrolled diabetes mellitus with complications of chronic kidney disease stage III and hyperglycemia Continue sliding scale insulin, hypoglycemic protocol, diabetic diet Morning hypoglycemia resolving after decreasing the long-acting to 6 units twice daily Lab Results  Component Value Date   HGBA1C 7.2 (H) 09/26/2020   Hypertension Continue metoprolol  Hypothyroidism Continue Synthroid  Depression  Continue sertraline   DVT prophylaxis: SCDs Code Status: Full code Family Communication: None present  Status is: Inpatient  Dispo: The patient is from: Home              Anticipated d/c is to: SNF - per PT recommendations              Anticipated d/c date is: 24 to 48 hours pending clinical course              Patient currently not medically stable for discharge given need for ongoing monitoring of hemoglobin, creatinine and vital signs given acute illnesses as above  Consultants:   Nephrology  Procedures:   None  Antimicrobials:  None indicated  Subjective: No acute issues or events overnight, denies chest pain, shortness of breath, nausea, vomiting, diarrhea, constipation, headache, fever, chills  Objective: Vitals:   11/01/20 1613  11/01/20 1953 11/01/20 2350 11/02/20 0425  BP: (!) 174/85 124/80 133/80 (!) 154/82  Pulse: 73 87 81 77  Resp: 20 18 13 18   Temp: 98.4 F (36.9 C) 99.1 F (37.3 C) 98.9 F (37.2 C) 97.7 F (36.5 C)  TempSrc: Oral Oral Oral Oral  SpO2: 100% 97% 95% 95%  Weight:    64.4 kg    Intake/Output Summary (Last 24 hours) at 11/02/2020 0750 Last data filed at 11/01/2020 2226 Gross per 24 hour  Intake 129 ml  Output 1100 ml  Net -971 ml   Filed Weights   10/31/20 0403 11/02/20 0425  Weight: 60.9 kg 64.4 kg    Examination:  General exam: Appears calm and comfortable  Respiratory system: Clear to auscultation. Respiratory effort normal. Cardiovascular system: S1 & S2 heard, RRR. No JVD, murmurs, rubs, gallops or clicks. No pedal edema. Gastrointestinal system: Abdomen is nondistended, soft and nontender. No organomegaly or masses felt. Normal bowel sounds heard. Central nervous system: Alert and oriented. No focal neurological deficits. Extremities: Right BKA. Skin: No rashes, lesions or ulcers Psychiatry: Judgement and insight appear normal. Mood & affect appropriate.   Data Reviewed: I have personally reviewed following labs and imaging studies  CBC: Recent Labs  Lab 10/29/20  1100 10/29/20 1100 10/29/20 1600 10/29/20 1600 10/29/20 2153 10/30/20 0114 10/31/20 0649 11/01/20 0220 11/02/20 0506  WBC 8.1   < > 6.1   < > 11.4* 13.6* 7.6 8.5 8.6  NEUTROABS 6.4  --  5.7  --  9.9*  --   --   --   --   HGB 6.7*   < > 7.0*   < > 11.4* 9.9* 9.0* 8.8* 9.4*  HCT 22.4*   < > 22.5*   < > 35.1* 30.4* 28.1* 28.4* 29.9*  MCV 116.7*   < > 111.4*   < > 102.3* 101.0* 103.3* 104.8* 105.3*  PLT 291   < > 227   < > 308 275 232 224 231   < > = values in this interval not displayed.   Basic Metabolic Panel: Recent Labs  Lab 10/29/20 1100 10/29/20 2153 10/30/20 0114 10/31/20 0649 11/01/20 0220 11/02/20 0506  NA 137  --  137 141 141 137  K 5.7*  --  5.3* 5.0 4.7 5.1  CL 102  --  106 108 107  102  CO2 23  --  22 21* 23 21*  GLUCOSE 392*  --  235* 109* 196* 314*  BUN 112*  --  101* 94* 90* 96*  CREATININE 3.05*  --  2.79* 2.36* 2.25* 2.46*  CALCIUM 8.6*  --  8.7* 8.9 9.1 9.2  PHOS  --  7.3*  --  5.5* 4.8* 4.1   GFR: Estimated Creatinine Clearance: 22 mL/min (A) (by C-G formula based on SCr of 2.46 mg/dL (H)). Liver Function Tests: Recent Labs  Lab 10/29/20 2153 10/31/20 0649 11/01/20 0220 11/02/20 0506  ALBUMIN 1.6* 2.1* 2.7* 2.2*   No results for input(s): LIPASE, AMYLASE in the last 168 hours. No results for input(s): AMMONIA in the last 168 hours. Coagulation Profile: No results for input(s): INR, PROTIME in the last 168 hours. Cardiac Enzymes: No results for input(s): CKTOTAL, CKMB, CKMBINDEX, TROPONINI in the last 168 hours. BNP (last 3 results) No results for input(s): PROBNP in the last 8760 hours. HbA1C: No results for input(s): HGBA1C in the last 72 hours. CBG: Recent Labs  Lab 11/01/20 1228 11/01/20 1308 11/01/20 1654 11/01/20 2040 11/02/20 0610  GLUCAP 68* 134* 279* 288* 270*   Lipid Profile: No results for input(s): CHOL, HDL, LDLCALC, TRIG, CHOLHDL, LDLDIRECT in the last 72 hours. Thyroid Function Tests: No results for input(s): TSH, T4TOTAL, FREET4, T3FREE, THYROIDAB in the last 72 hours. Anemia Panel: No results for input(s): VITAMINB12, FOLATE, FERRITIN, TIBC, IRON, RETICCTPCT in the last 72 hours. Sepsis Labs: No results for input(s): PROCALCITON, LATICACIDVEN in the last 168 hours.  Recent Results (from the past 240 hour(s))  Respiratory Panel by RT PCR (Flu A&B, Covid) - Nasopharyngeal Swab     Status: None   Collection Time: 10/29/20 12:58 PM   Specimen: Nasopharyngeal Swab  Result Value Ref Range Status   SARS Coronavirus 2 by RT PCR NEGATIVE NEGATIVE Final    Comment: (NOTE) SARS-CoV-2 target nucleic acids are NOT DETECTED.  The SARS-CoV-2 RNA is generally detectable in upper respiratoy specimens during the acute phase of  infection. The lowest concentration of SARS-CoV-2 viral copies this assay can detect is 131 copies/mL. A negative result does not preclude SARS-Cov-2 infection and should not be used as the sole basis for treatment or other patient management decisions. A negative result may occur with  improper specimen collection/handling, submission of specimen other than nasopharyngeal swab, presence of viral mutation(s) within the  areas targeted by this assay, and inadequate number of viral copies (<131 copies/mL). A negative result must be combined with clinical observations, patient history, and epidemiological information. The expected result is Negative.  Fact Sheet for Patients:  PinkCheek.be  Fact Sheet for Healthcare Providers:  GravelBags.it  This test is no t yet approved or cleared by the [place] FDA and  has been authorized for detection and/or diagnosis of SARS-CoV-2 by FDA under an Emergency Use Authorization (EUA). This EUA will remain  in effect (meaning this test can be used) for the duration of the COVID-19 declaration under Section 564(b)(1) of the Act, 21 U.S.C. section 360bbb-3(b)(1), unless the authorization is terminated or revoked sooner.     Influenza A by PCR NEGATIVE NEGATIVE Final   Influenza B by PCR NEGATIVE NEGATIVE Final    Comment: (NOTE) The Xpert Xpress SARS-CoV-2/FLU/RSV assay is intended as an aid in  the diagnosis of influenza from Nasopharyngeal swab specimens and  should not be used as a sole basis for treatment. Nasal washings and  aspirates are unacceptable for Xpert Xpress SARS-CoV-2/FLU/RSV  testing.  Fact Sheet for Patients: PinkCheek.be  Fact Sheet for Healthcare Providers: GravelBags.it  This test is not yet approved or cleared by the [place] FDA and  has been authorized for detection and/or diagnosis of SARS-CoV-2  by  FDA under an Emergency Use Authorization (EUA). This EUA will remain  in effect (meaning this test can be used) for the duration of the  Covid-19 declaration under Section 564(b)(1) of the Act, 21  U.S.C. section 360bbb-3(b)(1), unless the authorization is  terminated or revoked. Performed at [facility], [place] [address], [place], [place] [address]       Radiology Studies: No results found.  Scheduled Meds: . azaTHIOprine  50 mg Oral Daily  . collagenase   Topical Daily  . cycloSPORINE  75 mg Oral BID  . darbepoetin (ARANESP) injection - NON-DIALYSIS  100 mcg Subcutaneous Q Fri-1800  . furosemide  80 mg Intravenous Once  . Gerhardt's butt cream   Topical BID  . heparin injection (subcutaneous)  5,000 Units Subcutaneous Q8H  . insulin aspart  0-9 Units Subcutaneous TID WC  . insulin glargine  6 Units Subcutaneous BID  . levothyroxine  50 mcg Oral Daily  . metoprolol tartrate  12.5 mg Oral BID  . nutrition supplement (JUVEN)  1 packet Oral BID BM  . predniSONE  5 mg Oral Daily  . rosuvastatin  5 mg Oral QHS  . sertraline  25 mg Oral Daily  . sodium bicarbonate  650 mg Oral BID   Continuous Infusions: . albumin human       LOS: 4 days   Time spent: 79min  Ewell Benassi C Kanav Kazmierczak, DO [facility]  If 7PM-7AM, please contact night-coverage [email or web address]  11/02/2020, 7:50 AM

## 2020-11-03 ENCOUNTER — Encounter (HOSPITAL_COMMUNITY): Payer: Self-pay | Admitting: Internal Medicine

## 2020-11-03 DIAGNOSIS — N189 Chronic kidney disease, unspecified: Secondary | ICD-10-CM | POA: Diagnosis not present

## 2020-11-03 DIAGNOSIS — N179 Acute kidney failure, unspecified: Secondary | ICD-10-CM | POA: Diagnosis not present

## 2020-11-03 LAB — RENAL FUNCTION PANEL
Albumin: 2.4 g/dL — ABNORMAL LOW (ref 3.5–5.0)
Anion gap: 10 (ref 5–15)
BUN: 102 mg/dL — ABNORMAL HIGH (ref 6–20)
CO2: 23 mmol/L (ref 22–32)
Calcium: 9.3 mg/dL (ref 8.9–10.3)
Chloride: 103 mmol/L (ref 98–111)
Creatinine, Ser: 2.67 mg/dL — ABNORMAL HIGH (ref 0.44–1.00)
GFR, Estimated: 20 mL/min — ABNORMAL LOW (ref >60)
Glucose, Bld: 228 mg/dL — ABNORMAL HIGH (ref 70–99)
Phosphorus: 4.7 mg/dL — ABNORMAL HIGH (ref 2.5–4.6)
Potassium: 5.2 mmol/L — ABNORMAL HIGH (ref 3.5–5.1)
Sodium: 136 mmol/L (ref 135–145)

## 2020-11-03 LAB — CBC
HCT: 29.8 % — ABNORMAL LOW (ref 36.0–46.0)
Hemoglobin: 9.6 g/dL — ABNORMAL LOW (ref 12.0–15.0)
MCH: 33.7 pg (ref 26.0–34.0)
MCHC: 32.2 g/dL (ref 30.0–36.0)
MCV: 104.6 fL — ABNORMAL HIGH (ref 80.0–100.0)
Platelets: 237 10*3/uL (ref 150–400)
RBC: 2.85 MIL/uL — ABNORMAL LOW (ref 3.87–5.11)
RDW: 19.1 % — ABNORMAL HIGH (ref 11.5–15.5)
WBC: 6.7 10*3/uL (ref 4.0–10.5)
nRBC: 0 % (ref 0.0–0.2)

## 2020-11-03 LAB — GLUCOSE, CAPILLARY
Glucose-Capillary: 161 mg/dL — ABNORMAL HIGH (ref 70–99)
Glucose-Capillary: 91 mg/dL (ref 70–99)
Glucose-Capillary: 83 mg/dL (ref 70–99)
Glucose-Capillary: 95 mg/dL (ref 70–99)
Glucose-Capillary: 193 mg/dL — ABNORMAL HIGH (ref 70–99)

## 2020-11-03 MED ORDER — ALBUMIN HUMAN 25 % IV SOLN
25.0000 g | Freq: Four times a day (QID) | INTRAVENOUS | Status: AC
  Administered 2020-11-03 – 2020-11-04 (×3): 25 g via INTRAVENOUS
  Filled 2020-11-03 (×4): qty 100

## 2020-11-03 NOTE — Progress Notes (Signed)
Admit: 10/29/2020 LOS: 5  [name] is a/an 59 y.o. 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 No acute events overnight.  Denies any chest pain, SOB, abdominal pain.  Reports making good urine output.  11/07 0701 - 11/08 0700 In: -  Out: 1100 [Urine:1100]  Filed Weights   10/31/20 0403 11/02/20 0425  Weight: 60.9 kg 64.4 kg    Scheduled Meds: . azaTHIOprine  50 mg Oral Daily  . collagenase   Topical Daily  . cycloSPORINE  75 mg Oral BID  . darbepoetin (ARANESP) injection - NON-DIALYSIS  100 mcg Subcutaneous Q Fri-1800  . Gerhardt's butt cream   Topical BID  . heparin injection (subcutaneous)  5,000 Units Subcutaneous Q8H  . insulin aspart  0-9 Units Subcutaneous TID WC  . insulin glargine  6 Units Subcutaneous BID  . levothyroxine  50 mcg Oral Daily  . metoprolol tartrate  12.5 mg Oral BID  . nutrition supplement (JUVEN)  1 packet Oral BID BM  . predniSONE  5 mg Oral Daily  . rosuvastatin  5 mg Oral QHS  . sertraline  25 mg Oral Daily  . sodium bicarbonate  650 mg Oral BID   Continuous Infusions: PRN Meds: Current Labs: reviewed K 5.2, BUN 102, Cr 2.67   Physical Exam:  Blood pressure (!) 144/83, pulse 74, temperature 97.6 F (36.4 C), temperature source Oral, resp. rate 16, weight 64.4 kg, SpO2 95 %. GEN: wdwn, sitting in bed, nad ENT: no nasal discharge, mmm EYES: no scleral icterus, eomi CV: normal rate, no murmurs, no JVD PULM: no iwob, bilateral chest rise ABD: NABS, firm and edematous. Non tender SKIN: no rashes or jaundice EXT: 2+ edema, warm and well perfused, Rt BKA   A/P  Nonoliguric AKI on CKD 3 Likely secondary toanemiaandintravascular volume depletion possible tubular injury.Patient also has diffuse anasarca as below.  Creatinine slightly higher today. Good urine output 1.1L in past 24hrs.  Net neg  4.291L, likely inaccurate measurement.  -Consider repeat renal u/s as Cr continues to rise -Continue to monitor daily Cr, Dose meds for GFR -Monitor Daily I/Os, Daily weight  -I do not think dialysis is indicated at this time  Diffuse Anasarca/hypoalbuminemia Likely associated with chronic inflammation related to infections.  Nutrition may be contributing and low albumin on admission indicating poor prognosis.  Albumin was slightly improved today at 2.4 after receiving 25% yesterday. -Consider Torsemide for better absorption when able to tolerate  -Encourage dietary protein -Continue to monitor  Hypertension:  BP in acceptable range Continue to monitor  Anemia due toCKD/chronic inflammation Low albumin and increased iron saturations s/p transfusion 2uPRBS likely indicative of AOCD  -Aranesp 100 MCG weekly -Patient has been set up to continue Retacrit every 2 weekly outpatient -Transfuse for Hgb<7 g/dL  Uncontrolled Diabetes Mellitus Type1with hyperglycemia: Diabetic management per primary team  History of kidney/pancreas transplant Remote. Follows with Dr. Cira Servant. Baseline creatinine around 1.3. Continue home cyclosporine, aza, and prednisone.   Trough was appropriately drawn on 11/5.  Drawn too early on 11/6.  This will be followed up in the future  Chronic metabolic acidosis  Associated with kidney disease. Can continue home sodium bicarbonate.   Hyperkalemia  Monitor daily   [name], MD [facility]  11/03/2020, 8:34 AM  Recent Labs  Lab 11/01/20 0220 11/02/20 0506 11/03/20 0218  NA  141 137 136  K 4.7 5.1 5.2*  CL 107 102 103  CO2 23 21* 23  GLUCOSE 196* 314* 228*  BUN 90* 96* 102*  CREATININE 2.25* 2.46* 2.67*  CALCIUM 9.1 9.2 9.3  PHOS 4.8* 4.1 4.7*   Recent Labs  Lab 10/29/20 1100 10/29/20 1100 10/29/20 1600 10/29/20 1600 10/29/20 2153 10/30/20 0114 11/01/20 0220 11/02/20 0506 11/03/20 0218  WBC 8.1   < > 6.1   < >  11.4*   < > 8.5 8.6 6.7  NEUTROABS 6.4  --  5.7  --  9.9*  --   --   --   --   HGB 6.7*   < > 7.0*   < > 11.4*   < > 8.8* 9.4* 9.6*  HCT 22.4*   < > 22.5*   < > 35.1*   < > 28.4* 29.9* 29.8*  MCV 116.7*   < > 111.4*   < > 102.3*   < > 104.8* 105.3* 104.6*  PLT 291   < > 227   < > 308   < > 224 231 237   < > = values in this interval not displayed.    Plan communicated to the primary team

## 2020-11-03 NOTE — NC FL2 (Signed)
[place] MEDICAID FL2 LEVEL OF CARE SCREENING TOOL     IDENTIFICATION  Patient Name: [name] Birthdate: [birth year] Sex: female Admission Date (Current Location): 10/29/2020  [facility] and [place] Number:  Herbalist and Address:  [facility], [place] [address] [place], [place] [address]      Provider Number: [id number]  Attending Physician Name and Address:  Little Ishikawa, MD  Relative Name and Phone Number:       Current Level of Care: Hospital Recommended Level of Care: [place] Prior Approval Number:    Date Approved/Denied:   PASRR Number: [id number]  Discharge Plan: SNF    Current Diagnoses: Patient Active Problem List   Diagnosis Date Noted  . AKI (acute kidney injury) ([place]) 10/29/2020  . Gangrene of right foot ([place])   . Subacute osteomyelitis of right foot ([place])   . Diabetes mellitus with hyperglycemia ([place]) 09/27/2020  . Pressure injury of skin 09/27/2020  . Diabetic ketoacidosis ([place]) 09/26/2020  . Acute kidney injury superimposed on CKD ([place]) 09/26/2020  . Cellulitis and abscess of right leg 09/26/2020  . 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 History of simultaneous kidney and pancreas transplant ([place]) 05/14/2020  . Chronic kidney disease (CKD) stage G3a/A1, moderately decreased glomerular filtration rate (GFR) between 45-59 mL/min/1.73 square meter and albuminuria creatinine ratio less than 30 mg/g (HCC) 05/14/2020  . Hypertension 05/14/2020    Orientation RESPIRATION BLADDER Height & Weight     Self, Time, Situation, Place  Normal Incontinent, External catheter Weight: 141 lb 15.6 oz (64.4 kg) Height:     BEHAVIORAL SYMPTOMS/MOOD NEUROLOGICAL BOWEL NUTRITION STATUS      Continent Diet (Please see DC Summary)  AMBULATORY STATUS COMMUNICATION OF NEEDS Skin   Extensive Assist Verbally PU Stage and Appropriate Care (Unstageable on sacrum and heel;closed incision on leg;)                        Personal Care Assistance Level of Assistance  Bathing, Feeding, Dressing Bathing Assistance: Maximum assistance Feeding assistance: Independent Dressing Assistance: Limited assistance     Functional Limitations Info  Sight, Hearing, Speech Sight Info: Impaired Hearing Info: Adequate Speech Info: Adequate    SPECIAL CARE FACTORS FREQUENCY  PT (By licensed PT), OT (By licensed OT)     PT Frequency: 5x/week OT Frequency: 5x/week            Contractures Contractures Info: Not present    Additional Factors Info  Code Status, Allergies, Psychotropic, Insulin Sliding Scale Code Status Info: Full Allergies Info: NKA Psychotropic Info: Zoloft Insulin Sliding Scale Info: See DC Summary       Current Medications (11/03/2020):  This is the current hospital active medication list Current Facility-Administered Medications  Medication Dose Route Frequency Provider Last Rate Last Admin  . acetaminophen (TYLENOL) tablet 650 mg  650 mg Oral Q6H PRN Agbata, Tochukwu, MD      . azaTHIOprine (IMURAN) tablet 50 mg  50 mg Oral Daily [name], MD   50 mg at 11/03/20 0853  . collagenase (SANTYL) ointment   Topical Daily Little Ishikawa, MD   Given at 11/03/20 [phone]  . cycloSPORINE (SANDIMMUNE) capsule 75 mg  75 mg Oral BID [name], MD   75 mg at 11/03/20 0853  . Darbepoetin Alfa (ARANESP) injection 100 mcg  100 mcg Subcutaneous Q [id number] [name], MD   100 mcg at 10/31/20 1827  .  Gerhardt's butt cream   Topical BID Little Ishikawa, MD   Given at 11/03/20 0858  . heparin injection 5,000 Units  5,000 Units Subcutaneous Q8H Little Ishikawa, MD   5,000 Units at 11/03/20 [phone]  . HYDROcodone-acetaminophen (NORCO/VICODIN) 5-325 MG per tablet 1 tablet  1 tablet Oral Q4H PRN Agbata, Tochukwu, MD   1 tablet at 11/03/20 0851  . insulin aspart (novoLOG) injection 0-9 Units  0-9 Units Subcutaneous TID WC Little Ishikawa, MD   2 Units at  11/03/20 [phone]  . insulin glargine (LANTUS) injection 6 Units  6 Units Subcutaneous BID Little Ishikawa, MD   6 Units at 11/03/20 [phone]  . levothyroxine (SYNTHROID) tablet 50 mcg  50 mcg Oral Daily Agbata, Tochukwu, MD   50 mcg at 11/03/20 0647  . metoprolol tartrate (LOPRESSOR) tablet 12.5 mg  12.5 mg Oral BID Agbata, Tochukwu, MD   12.5 mg at 11/03/20 0853  . nutrition supplement (JUVEN) (JUVEN) powder packet 1 packet  1 packet Oral BID BM Agbata, Tochukwu, MD   1 packet at 10/30/20 1214  . ondansetron (ZOFRAN) tablet 4 mg  4 mg Oral Q6H PRN Agbata, Tochukwu, MD       Or  . ondansetron (ZOFRAN) injection 4 mg  4 mg Intravenous Q6H PRN Agbata, Tochukwu, MD      . predniSONE (DELTASONE) tablet 5 mg  5 mg Oral Daily Agbata, Tochukwu, MD   5 mg at 11/03/20 0853  . rosuvastatin (CRESTOR) tablet 5 mg  5 mg Oral QHS Little Ishikawa, MD   5 mg at 11/02/20 2244  . sertraline (ZOLOFT) tablet 25 mg  25 mg Oral Daily Agbata, Tochukwu, MD   25 mg at 11/03/20 0854  . sodium bicarbonate tablet 650 mg  650 mg Oral BID Agbata, Tochukwu, MD   650 mg at 11/03/20 1275     Discharge Medications: Please see discharge summary for a list of discharge medications.  Relevant Imaging Results:  Relevant Lab Results:   Additional Information SSN: [id number]  Benard Halsted, LCSW

## 2020-11-03 NOTE — TOC Initial Note (Signed)
Transition of Care [facility]) - Initial/Assessment Note    Patient Details  Name: [name] MRN: [id number] Date of Birth: 26-Mar-1961  Transition of Care [facility] CM/SW Contact:    Vinie Sill, [place] Phone Number: 11/03/2020, 1:45 PM  Clinical Narrative:                  CSW visit with patient at bedside. CSW introduced self and explained role. Patient confirms she is from [facility]. Patient was tearful and shared she does not to return to a SNF. Marland Kitchen  She believes she was not getting enough PT or needed care.She states she lives in the home with her spouse but he works nights and is unable to provide the level of care needed Patient inquired about CIR, CSW explained, PT is not recommending CIR. Patient was disappointed but was agreeable to SNF, in hopes she could placed at another facility. CSW explained the SNF process.   CSW will provide bed offers once available.  CSW will continue to follow and assist with discharge planning.   Thurmond Butts, MSW, [place] Clinical Social Worker   Expected Discharge Plan: Skilled Nursing Facility Barriers to Discharge: Continued Medical Work up, SNF Pending bed offer   Patient Goals and CMS Choice        Expected Discharge Plan and Services Expected Discharge Plan: [place] In-house Referral: Clinical Social Work                                            Prior Living Arrangements/Services   Lives with:: Self, Spouse Patient language and need for interpreter reviewed:: No        Need for Family Participation in Patient Care: Yes (Comment) Care giver support system in place?: Yes (comment)   Criminal Activity/Legal Involvement Pertinent to Current Situation/Hospitalization: No - Comment as needed  Activities of Daily Living Home Assistive Devices/Equipment: Wheelchair    Permission Sought/Granted Permission sought to share information with : Family Supports Permission granted to share  information with : Yes, Verbal Permission Granted  Share Information with NAME: Indiah Heyden  Permission granted to share info w AGENCY: SNFs  Permission granted to share info w Relationship: spouse  Permission granted to share info w Contact Information: [phone]  Emotional Assessment Appearance:: Appears stated age Attitude/Demeanor/Rapport: Engaged Affect (typically observed): Accepting, Appropriate, Hopeful Orientation: : Oriented to Self, Oriented to Place, Oriented to  Time, Oriented to Situation Alcohol / Substance Use: Not Applicable Psych Involvement: No (comment)  Admission diagnosis:  Hyperkalemia [E87.5] AKI (acute kidney injury) ([place]) [N17.9] Symptomatic anemia [D64.9] Patient Active Problem List   Diagnosis Date Noted  . AKI (acute kidney injury) ([place]) 10/29/2020  . Gangrene of right foot ([place])   . Subacute osteomyelitis of right foot ([place])   . Diabetes mellitus with hyperglycemia ([place]) 09/27/2020  . Pressure injury of skin 09/27/2020  . Diabetic ketoacidosis ([place]) 09/26/2020  . Acute kidney injury superimposed on CKD ([place]) 09/26/2020  . Cellulitis and abscess of right leg 09/26/2020  . 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 History of simultaneous kidney and pancreas transplant ([place]) 05/14/2020  . Chronic kidney disease (CKD) stage G3a/A1, moderately decreased glomerular filtration rate (GFR) between 45-59 mL/min/1.73 square meter and albuminuria creatinine ratio less than 30 mg/g (HCC) 05/14/2020  . Hypertension 05/14/2020   PCP:  [name], MD Pharmacy:   [facility]  [place], [place] [address] [place] [address] Phone: [phone] Fax: [phone]     Social Determinants of Health (SDOH) Interventions    Readmission Risk Interventions No flowsheet data found.

## 2020-11-03 NOTE — Progress Notes (Signed)
PROGRESS NOTE    [name]  DDU:[id number] DOB: [birth year] DOA: 10/29/2020 PCP: [name], MD   Brief Narrative:  [name] is a 59 y.o. female with medical history significant for DM, S/P renal and pancreas transplant about 10 years ago, Stage 3 CKD, HTN, s/p Rt BKA for osteomyelitis who presents to the emergency room from the subacute rehab center for evaluation of abnormal lab values.  Patient noted to have serum creatinine of 3.8 above her baseline of 1.27 from 3 weeks ago, hemoglobin of 6.5g/dl compared to her baseline of 7.7g/dl 3 weeks ago. Her stools are brown in color. She denies having any hematemesis, hematochezia or melena stools. She denies any NSAID use.  She denies having any nausea, no vomiting, no abdominal pain, no diarrhea, no urinary symptoms. She complains of feeling weak, bloated and fatigued. Labs show sodium 137, potassium 3.7, chloride 102, bicarb 23, glucose 392, BUN 112, creatinine 3.05, calcium 8.6, white count 8.1, hemoglobin 6.7, hematocrit 22.4, MCV 116, RDW 18.6, platelet count 291. Respiratory viral panel is negative. Twelve-lead EKG reviewed by me shows sinus rhythm.  In the ED Patient noted to have worsening of her renal function with serum creatinine today on admission 3.05 compared to baseline of 1.27, potassium of 5.7, hemoglobin of 6.7 compared to baseline of 7.7 and serum glucose of 392.  Patient was treated with [facility] in the emergency room and will be admitted to the hospital for further evaluation.  Creatinine initially improving with supportive care, now downtrending despite diuretics and albumin, currently holding diuretics in hopes to improve creatinine and urinary output..  Assessment & Plan:   Principal Problem:   Acute kidney injury superimposed on CKD ([place]) Active Problems:   Anemia secondary to renal failure   History of simultaneous kidney and pancreas transplant ([place])   Chronic kidney disease (CKD) stage G3a/A1, moderately  decreased glomerular filtration rate (GFR) between 45-59 mL/min/1.73 square meter and albuminuria creatinine ratio less than 30 mg/g (HCC)   Hypertension   Diabetes mellitus with hyperglycemia ([place])   Acute kidney injury superimposed on stage IIIA chronic kidney disease Likely secondary to intravascular volume depletion from anemia Hold ACE inhibitors/diuretics as well as other nephrotoxic agents Repeat renal panel continues to show increasing creatinine Nephrology following, appreciate insight and recommendations - clarified to CKD3A (not 3B as previously noted) Currently off diuretics per nephrology Lab Results  Component Value Date   CREATININE 2.67 (H) 11/03/2020   CREATININE 2.46 (H) 11/02/2020   CREATININE 2.25 (H) 11/01/2020    Diffuse anasarca, stable Most likely secondary to renal disease Protein remains low - encourage increased PO intake Diuretics on hold as above    Component Value Date/Time   NA 136 11/03/2020 0218   K 5.2 (H) 11/03/2020 0218   CL 103 11/03/2020 0218   CO2 23 11/03/2020 0218   GLUCOSE 228 (H) 11/03/2020 0218   BUN 102 (H) 11/03/2020 0218   CREATININE 2.67 (H) 11/03/2020 0218   CALCIUM 9.3 11/03/2020 0218   PROT 4.5 (L) 10/06/2020 0712   ALBUMIN 2.4 (L) 11/03/2020 0218   AST 47 (H) 10/06/2020 0712   ALT 24 10/06/2020 0712   ALKPHOS 63 10/06/2020 0712   BILITOT 0.4 10/06/2020 0712   GFRNONAA 20 (L) 11/03/2020 0218   GFRAA 39 (L) 09/29/2020 2376   Anemia secondary to chronic kidney disease, POA Iron panel confirms not iron deficiency -likely anemia of chronic disease, continue supportive care, nephrology following -defer use of EPO or similar to their  expertise    Component Value Date/Time   IRON 95 10/29/2020 2153   TIBC 143 (L) 10/29/2020 2153   FERRITIN 1,032 (H) 10/29/2020 2153   IRONPCTSAT 67 (H) 10/29/2020 2153  Status post 2 units of packed RBC given at admission  Pressure ulcer stage III on sacral area and diabetic ulcer on left  heel, POA.  Continue supportive care, bandages per nursing protocol  Status post renal and pancreatic transplant Continue prednisone, cyclosporine and azathioprine  Hyperkalemia, minimal but ongoing Most likely related to ACE inhibitor use and worsening renal function Holding both lisinopril and furosemide in the setting of above Patient received Lokelma in the ER x1 -if continues to worsen will repeat dose Lab Results  Component Value Date   K 5.2 (H) 11/03/2020   Uncontrolled diabetes mellitus with complications of chronic kidney disease stage III and hyperglycemia Continue sliding scale insulin, hypoglycemic protocol, diabetic diet Morning hypoglycemia resolving after decreasing the long-acting to 6 units twice daily Lab Results  Component Value Date   HGBA1C 7.2 (H) 09/26/2020   Hypertension Continue metoprolol  Hypothyroidism Continue Synthroid  Depression  Continue sertraline   DVT prophylaxis: SCDs Code Status: Full code Family Communication: None present  Status is: Inpatient  Dispo: The patient is from: Home              Anticipated d/c is to: SNF - per PT recommendations              Anticipated d/c date is: 48 hours pending clinical course              Patient currently not medically stable for discharge given need for ongoing monitoring of hemoglobin, creatinine and vital signs given acute illnesses as above  Consultants:   Nephrology  Procedures:   None  Antimicrobials:  None indicated  Subjective: No acute issues or events overnight, denies chest pain, shortness of breath, nausea, vomiting, diarrhea, constipation, headache, fever, chills  Objective: Vitals:   11/02/20 0757 11/02/20 1216 11/02/20 2000 11/02/20 2343  BP: 134/70 (!) 163/76 (!) 182/83 (!) 167/84  Pulse: 74 73 68 60  Resp: 19 16 18 14   Temp: 97.6 F (36.4 C) 97.7 F (36.5 C) 97.6 F (36.4 C) 97.8 F (36.6 C)  TempSrc: Oral Oral Oral Oral  SpO2: 94% 94% 98% 96%    Weight:        Intake/Output Summary (Last 24 hours) at 11/03/2020 0728 Last data filed at 11/02/2020 2344 Gross per 24 hour  Intake --  Output 1100 ml  Net -1100 ml   Filed Weights   10/31/20 0403 11/02/20 0425  Weight: 60.9 kg 64.4 kg    Examination:  General exam: Appears calm and comfortable  Respiratory system: Clear to auscultation. Respiratory effort normal. Cardiovascular system: S1 & S2 heard, RRR. No JVD, murmurs, rubs, gallops or clicks. No pedal edema. Gastrointestinal system: Abdomen is nondistended, soft and nontender. No organomegaly or masses felt. Normal bowel sounds heard. Central nervous system: Alert and oriented. No focal neurological deficits. Extremities: Right BKA. Skin: No rashes, lesions or ulcers Psychiatry: Judgement and insight appear normal. Mood & affect appropriate.   Data Reviewed: I have personally reviewed following labs and imaging studies  CBC: Recent Labs  Lab 10/29/20 1100 10/29/20 1100 10/29/20 1600 10/29/20 1600 10/29/20 2153 10/29/20 2153 10/30/20 0114 10/31/20 0649 11/01/20 0220 11/02/20 0506 11/03/20 0218  WBC 8.1   < > 6.1   < > 11.4*   < > 13.6* 7.6 8.5 8.6 6.7  NEUTROABS 6.4  --  5.7  --  9.9*  --   --   --   --   --   --   HGB 6.7*   < > 7.0*   < > 11.4*   < > 9.9* 9.0* 8.8* 9.4* 9.6*  HCT 22.4*   < > 22.5*   < > 35.1*   < > 30.4* 28.1* 28.4* 29.9* 29.8*  MCV 116.7*   < > 111.4*   < > 102.3*   < > 101.0* 103.3* 104.8* 105.3* 104.6*  PLT 291   < > 227   < > 308   < > 275 232 224 231 237   < > = values in this interval not displayed.   Basic Metabolic Panel: Recent Labs  Lab 10/29/20 1100 10/29/20 2153 10/30/20 0114 10/31/20 0649 11/01/20 0220 11/02/20 0506 11/03/20 0218  NA   < >  --  137 141 141 137 136  K   < >  --  5.3* 5.0 4.7 5.1 5.2*  CL   < >  --  106 108 107 102 103  CO2   < >  --  22 21* 23 21* 23  GLUCOSE   < >  --  235* 109* 196* 314* 228*  BUN   < >  --  101* 94* 90* 96* 102*  CREATININE   < >   --  2.79* 2.36* 2.25* 2.46* 2.67*  CALCIUM   < >  --  8.7* 8.9 9.1 9.2 9.3  PHOS  --  7.3*  --  5.5* 4.8* 4.1 4.7*   < > = values in this interval not displayed.   GFR: Estimated Creatinine Clearance: 20.2 mL/min (A) (by C-G formula based on SCr of 2.67 mg/dL (H)). Liver Function Tests: Recent Labs  Lab 10/29/20 2153 10/31/20 0649 11/01/20 0220 11/02/20 0506 11/03/20 0218  ALBUMIN 1.6* 2.1* 2.7* 2.2* 2.4*   No results for input(s): LIPASE, AMYLASE in the last 168 hours. No results for input(s): AMMONIA in the last 168 hours. Coagulation Profile: No results for input(s): INR, PROTIME in the last 168 hours. Cardiac Enzymes: No results for input(s): CKTOTAL, CKMB, CKMBINDEX, TROPONINI in the last 168 hours. BNP (last 3 results) No results for input(s): PROBNP in the last 8760 hours. HbA1C: No results for input(s): HGBA1C in the last 72 hours. CBG: Recent Labs  Lab 11/02/20 1215 11/02/20 1400 11/02/20 1652 11/02/20 2122 11/03/20 0651  GLUCAP 131* 129* 134* 204* 161*   Lipid Profile: No results for input(s): CHOL, HDL, LDLCALC, TRIG, CHOLHDL, LDLDIRECT in the last 72 hours. Thyroid Function Tests: No results for input(s): TSH, T4TOTAL, FREET4, T3FREE, THYROIDAB in the last 72 hours. Anemia Panel: No results for input(s): VITAMINB12, FOLATE, FERRITIN, TIBC, IRON, RETICCTPCT in the last 72 hours. Sepsis Labs: No results for input(s): PROCALCITON, LATICACIDVEN in the last 168 hours.  Recent Results (from the past 240 hour(s))  Respiratory Panel by RT PCR (Flu A&B, Covid) - Nasopharyngeal Swab     Status: None   Collection Time: 10/29/20 12:58 PM   Specimen: Nasopharyngeal Swab  Result Value Ref Range Status   SARS Coronavirus 2 by RT PCR NEGATIVE NEGATIVE Final    Comment: (NOTE) SARS-CoV-2 target nucleic acids are NOT DETECTED.  The SARS-CoV-2 RNA is generally detectable in upper respiratoy specimens during the acute phase of infection. The lowest concentration of  SARS-CoV-2 viral copies this assay can detect is 131 copies/mL. A negative result does not preclude SARS-Cov-2  infection and should not be used as the sole basis for treatment or other patient management decisions. A negative result may occur with  improper specimen collection/handling, submission of specimen other than nasopharyngeal swab, presence of viral mutation(s) within the areas targeted by this assay, and inadequate number of viral copies (<131 copies/mL). A negative result must be combined with clinical observations, patient history, and epidemiological information. The expected result is Negative.  Fact Sheet for Patients:  PinkCheek.be  Fact Sheet for Healthcare Providers:  GravelBags.it  This test is no t yet approved or cleared by the [place] FDA and  has been authorized for detection and/or diagnosis of SARS-CoV-2 by FDA under an Emergency Use Authorization (EUA). This EUA will remain  in effect (meaning this test can be used) for the duration of the COVID-19 declaration under Section 564(b)(1) of the Act, 21 U.S.C. section 360bbb-3(b)(1), unless the authorization is terminated or revoked sooner.     Influenza A by PCR NEGATIVE NEGATIVE Final   Influenza B by PCR NEGATIVE NEGATIVE Final    Comment: (NOTE) The Xpert Xpress SARS-CoV-2/FLU/RSV assay is intended as an aid in  the diagnosis of influenza from Nasopharyngeal swab specimens and  should not be used as a sole basis for treatment. Nasal washings and  aspirates are unacceptable for Xpert Xpress SARS-CoV-2/FLU/RSV  testing.  Fact Sheet for Patients: PinkCheek.be  Fact Sheet for Healthcare Providers: GravelBags.it  This test is not yet approved or cleared by the [place] FDA and  has been authorized for detection and/or diagnosis of SARS-CoV-2 by  FDA under an Emergency Use  Authorization (EUA). This EUA will remain  in effect (meaning this test can be used) for the duration of the  Covid-19 declaration under Section 564(b)(1) of the Act, 21  U.S.C. section 360bbb-3(b)(1), unless the authorization is  terminated or revoked. Performed at [facility], [place] [address] [place], [place] [address]       Radiology Studies: No results found.  Scheduled Meds: . azaTHIOprine  50 mg Oral Daily  . collagenase   Topical Daily  . cycloSPORINE  75 mg Oral BID  . darbepoetin (ARANESP) injection - NON-DIALYSIS  100 mcg Subcutaneous Q Fri-1800  . Gerhardt's butt cream   Topical BID  . heparin injection (subcutaneous)  5,000 Units Subcutaneous Q8H  . insulin aspart  0-9 Units Subcutaneous TID WC  . insulin glargine  6 Units Subcutaneous BID  . levothyroxine  50 mcg Oral Daily  . metoprolol tartrate  12.5 mg Oral BID  . nutrition supplement (JUVEN)  1 packet Oral BID BM  . predniSONE  5 mg Oral Daily  . rosuvastatin  5 mg Oral QHS  . sertraline  25 mg Oral Daily  . sodium bicarbonate  650 mg Oral BID   Continuous Infusions:    LOS: 5 days   Time spent: 41min  Arling Cerone C Reva Pinkley, DO [facility]  If 7PM-7AM, please contact night-coverage [email or web address]  11/03/2020, 7:28 AM

## 2020-11-04 DIAGNOSIS — N189 Chronic kidney disease, unspecified: Secondary | ICD-10-CM | POA: Diagnosis not present

## 2020-11-04 DIAGNOSIS — N179 Acute kidney failure, unspecified: Secondary | ICD-10-CM | POA: Diagnosis not present

## 2020-11-04 LAB — GLUCOSE, CAPILLARY
Glucose-Capillary: 176 mg/dL — ABNORMAL HIGH (ref 70–99)
Glucose-Capillary: 123 mg/dL — ABNORMAL HIGH (ref 70–99)
Glucose-Capillary: 150 mg/dL — ABNORMAL HIGH (ref 70–99)
Glucose-Capillary: 196 mg/dL — ABNORMAL HIGH (ref 70–99)

## 2020-11-04 LAB — CBC
HCT: 29.4 % — ABNORMAL LOW (ref 36.0–46.0)
Hemoglobin: 9.4 g/dL — ABNORMAL LOW (ref 12.0–15.0)
MCH: 33.1 pg (ref 26.0–34.0)
MCHC: 32 g/dL (ref 30.0–36.0)
MCV: 103.5 fL — ABNORMAL HIGH (ref 80.0–100.0)
Platelets: 217 10*3/uL (ref 150–400)
RBC: 2.84 MIL/uL — ABNORMAL LOW (ref 3.87–5.11)
RDW: 18.8 % — ABNORMAL HIGH (ref 11.5–15.5)
WBC: 5.4 10*3/uL (ref 4.0–10.5)
nRBC: 0 % (ref 0.0–0.2)

## 2020-11-04 LAB — RENAL FUNCTION PANEL
Albumin: 3.3 g/dL — ABNORMAL LOW (ref 3.5–5.0)
Anion gap: 13 (ref 5–15)
BUN: 95 mg/dL — ABNORMAL HIGH (ref 6–20)
CO2: 22 mmol/L (ref 22–32)
Calcium: 9.9 mg/dL (ref 8.9–10.3)
Chloride: 102 mmol/L (ref 98–111)
Creatinine, Ser: 2.47 mg/dL — ABNORMAL HIGH (ref 0.44–1.00)
GFR, Estimated: 22 mL/min — ABNORMAL LOW (ref >60)
Glucose, Bld: 209 mg/dL — ABNORMAL HIGH (ref 70–99)
Phosphorus: 4.3 mg/dL (ref 2.5–4.6)
Potassium: 4.6 mmol/L (ref 3.5–5.1)
Sodium: 137 mmol/L (ref 135–145)

## 2020-11-04 MED ORDER — HYDRALAZINE HCL 20 MG/ML IJ SOLN
10.0000 mg | Freq: Once | INTRAMUSCULAR | Status: AC
  Administered 2020-11-04: 10 mg via INTRAVENOUS
  Filled 2020-11-04: qty 1

## 2020-11-04 NOTE — Progress Notes (Signed)
Admit: 10/29/2020 LOS: 6  [name] is a/an 59 y.o. 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 No acute events overnight.  No concerns.  Denies any chest pain, SOB, abdominal pain or decrease in urine output.    11/08 0701 - 11/09 0700 In: 120 [P.O.:120] Out: 1450 [Urine:1450]  Filed Weights   10/31/20 0403 11/02/20 0425  Weight: 60.9 kg 64.4 kg    Scheduled Meds: . azaTHIOprine  50 mg Oral Daily  . collagenase   Topical Daily  . cycloSPORINE  75 mg Oral BID  . darbepoetin (ARANESP) injection - NON-DIALYSIS  100 mcg Subcutaneous Q Fri-1800  . Gerhardt's butt cream   Topical BID  . heparin injection (subcutaneous)  5,000 Units Subcutaneous Q8H  . insulin aspart  0-9 Units Subcutaneous TID WC  . insulin glargine  6 Units Subcutaneous BID  . levothyroxine  50 mcg Oral Daily  . metoprolol tartrate  12.5 mg Oral BID  . nutrition supplement (JUVEN)  1 packet Oral BID BM  . predniSONE  5 mg Oral Daily  . rosuvastatin  5 mg Oral QHS  . sertraline  25 mg Oral Daily  . sodium bicarbonate  650 mg Oral BID   Continuous Infusions: PRN Meds: Current Labs: reviewed K 4.6, BUN 95, Cr 2.47   Physical Exam:  Blood pressure (!) 160/67, pulse 74, temperature 97.9 F (36.6 C), temperature source Tympanic, resp. rate 17, weight 64.4 kg, SpO2 96 %. GEN: wdwn, sitting in bed, nad ENT: no nasal discharge, mmm EYES: no scleral icterus, eomi CV: normal rate, no murmurs, no JVD PULM: no iwob, bilateral chest rise ABD: NABS, firm and edematous. Non tender SKIN: no rashes or jaundice EXT: 2+ edema, warm and well perfused, Rt BKA   A/P  Nonoliguric AKI on CKD 3 Likely secondary toanemiaandintravascular volume depletion possible tubular injury.Continues to have diffuse anasarca.  Creatinine improved today. Good urine output 1.4.5L in past 24hrs.   -Continue to monitor daily Cr, Dose meds for GFR -Monitor Daily I/Os, Daily weight  -I do not think dialysis is indicated at this time -Continue to hold off on diuretics for now   Diffuse Anasarca/hypoalbuminemia Likely associated with chronic inflammation related to infections.  Nutrition may be contributing and low albumin on admission indicating poor prognosis.  Albumin was improved today at 3.3 after receiving 25% yesterday. 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 Follows with Dr. Cira Servant. Baseline creatinine around 1.3. Continue home cyclosporine, aza, and prednisone.   Trough was appropriately drawn on 11/5.  Drawn too early on 11/6.  This will be followed up in the future  Chronic metabolic acidosis  Associated with kidney disease. Can continue home sodium bicarbonate.    Doris Leitz, MD [facility]  11/04/2020, 10:35 AM  Recent Labs  Lab 11/02/20 6283 11/03/20 0218 11/04/20 0513  NA  137 136 137  K 5.1 5.2* 4.6  CL 102 103 102  CO2 21* 23 22  GLUCOSE 314* 228* 209*  BUN 96* 102* 95*  CREATININE 2.46* 2.67* 2.47*  CALCIUM 9.2 9.3 9.9  PHOS 4.1 4.7* 4.3   Recent Labs  Lab 10/29/20 1100 10/29/20 1100 10/29/20 1600 10/29/20 1600 10/29/20 2153 10/30/20 0114 11/02/20 0506 11/03/20 0218 11/04/20 0513  WBC 8.1   < > 6.1   < >  11.4*   < > 8.6 6.7 5.4  NEUTROABS 6.4  --  5.7  --  9.9*  --   --   --   --   HGB 6.7*   < > 7.0*   < > 11.4*   < > 9.4* 9.6* 9.4*  HCT 22.4*   < > 22.5*   < > 35.1*   < > 29.9* 29.8* 29.4*  MCV 116.7*   < > 111.4*   < > 102.3*   < > 105.3* 104.6* 103.5*  PLT 291   < > 227   < > 308   < > 231 237 217   < > = values in this interval not displayed.    Plan communicated to the primary team

## 2020-11-04 NOTE — Progress Notes (Signed)
PROGRESS NOTE    [name]  LOV:[id number] DOB: January 23, 1961 DOA: 10/29/2020 PCP: [name], MD   Brief Narrative:  [name] is a 59 y.o. female with medical history significant for DM, S/P renal and pancreas transplant about 10 years ago, Stage 3 CKD, HTN, s/p Rt BKA for osteomyelitis who presents to the emergency room from the subacute rehab center for evaluation of abnormal lab values.  Patient noted to have serum creatinine of 3.8 above her baseline of 1.27 from 3 weeks ago, hemoglobin of 6.5g/dl compared to her baseline of 7.7g/dl 3 weeks ago. Her stools are brown in color. She denies having any hematemesis, hematochezia or melena stools. She denies any NSAID use.  She denies having any nausea, no vomiting, no abdominal pain, no diarrhea, no urinary symptoms. She complains of feeling weak, bloated and fatigued. Labs show sodium 137, potassium 3.7, chloride 102, bicarb 23, glucose 392, BUN 112, creatinine 3.05, calcium 8.6, white count 8.1, hemoglobin 6.7, hematocrit 22.4, MCV 116, RDW 18.6, platelet count 291. Respiratory viral panel is negative. Twelve-lead EKG reviewed by me shows sinus rhythm.  In the ED Patient noted to have worsening of her renal function with serum creatinine today on admission 3.05 compared to baseline of 1.27, potassium of 5.7, hemoglobin of 6.7 compared to baseline of 7.7 and serum glucose of 392.  Patient was treated with [facility] in the emergency room and will be admitted to the hospital for further evaluation.  Creatinine initially improving with supportive care, now downtrending despite diuretics and albumin, currently holding diuretics in hopes to improve creatinine and urinary output..  Assessment & Plan:   Principal Problem:   Acute kidney injury superimposed on CKD ([name]) Active Problems:   Anemia secondary to renal failure   History of simultaneous kidney and pancreas transplant ([name])   Chronic kidney disease (CKD) stage G3a/A1, moderately  decreased glomerular filtration rate (GFR) between 45-59 mL/min/1.73 square meter and albuminuria creatinine ratio less than 30 mg/g ([facility])   Hypertension   Diabetes mellitus with hyperglycemia ([facility])  Acute kidney injury superimposed on stage IIIA chronic kidney disease Likely secondary to intravascular volume depletion from anemia Hold ACE inhibitors/diuretics as well as other nephrotoxic agents -nephrology recommending torsemide once able to restart diuretics Creatinine finally downtrending minimally over the past 24 hours. Nephrology following, appreciate insight and recommendations - clarified to CKD3A (not 3B as previously noted) Currently off diuretics per nephrology Lab Results  Component Value Date   CREATININE 2.47 (H) 11/04/2020   CREATININE 2.67 (H) 11/03/2020   CREATININE 2.46 (H) 11/02/2020    Diffuse anasarca, stable Most likely secondary to renal disease Protein remains low - encourage increased PO intake Diuretics on hold as above    Component Value Date/Time   NA 137 11/04/2020 0513   K 4.6 11/04/2020 0513   CL 102 11/04/2020 0513   CO2 22 11/04/2020 0513   GLUCOSE 209 (H) 11/04/2020 0513   BUN 95 (H) 11/04/2020 0513   CREATININE 2.47 (H) 11/04/2020 0513   CALCIUM 9.9 11/04/2020 0513   PROT 4.5 (L) 10/06/2020 0712   ALBUMIN 3.3 (L) 11/04/2020 0513   AST 47 (H) 10/06/2020 0712   ALT 24 10/06/2020 0712   ALKPHOS 63 10/06/2020 0712   BILITOT 0.4 10/06/2020 0712   GFRNONAA 22 (L) 11/04/2020 0513   GFRAA 39 (L) 09/29/2020 0639   Anemia secondary to chronic kidney disease, POA Iron panel confirms not iron deficiency -likely anemia of chronic disease, continue supportive care, nephrology following -defer  use of EPO or similar to their expertise    Component Value Date/Time   IRON 95 10/29/2020 2153   TIBC 143 (L) 10/29/2020 2153   FERRITIN 1,032 (H) 10/29/2020 2153   IRONPCTSAT 67 (H) 10/29/2020 2153  Status post 2 units of packed RBC given at  admission  Pressure ulcer stage III on sacral area and diabetic ulcer on left heel, POA.  Continue supportive care, bandages per nursing protocol  Status post renal and pancreatic transplant Continue prednisone, cyclosporine and azathioprine  Hyperkalemia, minimal but ongoing Most likely related to ACE inhibitor use and worsening renal function Holding both lisinopril and furosemide in the setting of above Patient received Lokelma in the ER x1 -if continues to worsen will repeat dose Lab Results  Component Value Date   K 4.6 11/04/2020   Uncontrolled diabetes mellitus with complications of chronic kidney disease stage III and hyperglycemia Continue sliding scale insulin, hypoglycemic protocol, diabetic diet Morning hypoglycemia resolving after decreasing the long-acting to 6 units twice daily Lab Results  Component Value Date   HGBA1C 7.2 (H) 09/26/2020   Hypertension Continue metoprolol  Hypothyroidism Continue Synthroid  Depression  Continue sertraline  DVT prophylaxis: SCDs Code Status: Full code Family Communication: None present  Status is: Inpatient  Dispo: The patient is from: Home              Anticipated d/c is to: SNF - per PT recommendations              Anticipated d/c date is: 48 hours pending clinical course              Patient currently not medically stable for discharge given need for ongoing monitoring of hemoglobin, creatinine and vital signs given acute illnesses as above  Consultants:   Nephrology  Procedures:   None  Antimicrobials:  None indicated  Subjective: No acute issues or events overnight, denies chest pain, shortness of breath, nausea, vomiting, diarrhea, constipation, headache, fever, chills  Objective: Vitals:   11/03/20 1608 11/03/20 1701 11/04/20 0141 11/04/20 0508  BP: (!) 160/83 (!) 161/72 (!) 170/78 (!) 160/67  Pulse: 74 72 68 74  Resp: 20 19 17 17   Temp: 98 F (36.7 C) 98 F (36.7 C) 98 F (36.7 C) 97.9 F  (36.6 C)  TempSrc: Oral Oral Oral Tympanic  SpO2: 98% 100% 95% 96%  Weight:        Intake/Output Summary (Last 24 hours) at 11/04/2020 0824 Last data filed at 11/04/2020 0508 Gross per 24 hour  Intake 120 ml  Output 1450 ml  Net -1330 ml   Filed Weights   10/31/20 0403 11/02/20 0425  Weight: 60.9 kg 64.4 kg    Examination:  General exam: Appears calm and comfortable  Respiratory system: Clear to auscultation. Respiratory effort normal. Cardiovascular system: S1 & S2 heard, RRR. No JVD, murmurs, rubs, gallops or clicks. No pedal edema. Gastrointestinal system: Abdomen is nondistended, soft and nontender. No organomegaly or masses felt. Normal bowel sounds heard. Central nervous system: Alert and oriented. No focal neurological deficits. Extremities: Right BKA. Skin: No rashes, lesions or ulcers Psychiatry: Judgement and insight appear normal. Mood & affect appropriate.   Data Reviewed: I have personally reviewed following labs and imaging studies  CBC: Recent Labs  Lab 10/29/20 1100 10/29/20 1100 10/29/20 1600 10/29/20 1600 10/29/20 2153 10/30/20 0114 10/31/20 0649 11/01/20 0220 11/02/20 0506 11/03/20 0218 11/04/20 0513  WBC 8.1   < > 6.1   < > 11.4*   < >  7.6 8.5 8.6 6.7 5.4  NEUTROABS 6.4  --  5.7  --  9.9*  --   --   --   --   --   --   HGB 6.7*   < > 7.0*   < > 11.4*   < > 9.0* 8.8* 9.4* 9.6* 9.4*  HCT 22.4*   < > 22.5*   < > 35.1*   < > 28.1* 28.4* 29.9* 29.8* 29.4*  MCV 116.7*   < > 111.4*   < > 102.3*   < > 103.3* 104.8* 105.3* 104.6* 103.5*  PLT 291   < > 227   < > 308   < > 232 224 231 237 217   < > = values in this interval not displayed.   Basic Metabolic Panel: Recent Labs  Lab 10/31/20 0649 11/01/20 0220 11/02/20 0506 11/03/20 0218 11/04/20 0513  NA 141 141 137 136 137  K 5.0 4.7 5.1 5.2* 4.6  CL 108 107 102 103 102  CO2 21* 23 21* 23 22  GLUCOSE 109* 196* 314* 228* 209*  BUN 94* 90* 96* 102* 95*  CREATININE 2.36* 2.25* 2.46* 2.67* 2.47*   CALCIUM 8.9 9.1 9.2 9.3 9.9  PHOS 5.5* 4.8* 4.1 4.7* 4.3   GFR: Estimated Creatinine Clearance: 21.9 mL/min (A) (by C-G formula based on SCr of 2.47 mg/dL (H)). Liver Function Tests: Recent Labs  Lab 10/31/20 0649 11/01/20 0220 11/02/20 0506 11/03/20 0218 11/04/20 0513  ALBUMIN 2.1* 2.7* 2.2* 2.4* 3.3*   No results for input(s): LIPASE, AMYLASE in the last 168 hours. No results for input(s): AMMONIA in the last 168 hours. Coagulation Profile: No results for input(s): INR, PROTIME in the last 168 hours. Cardiac Enzymes: No results for input(s): CKTOTAL, CKMB, CKMBINDEX, TROPONINI in the last 168 hours. BNP (last 3 results) No results for input(s): PROBNP in the last 8760 hours. HbA1C: No results for input(s): HGBA1C in the last 72 hours. CBG: Recent Labs  Lab 11/03/20 1153 11/03/20 1629 11/03/20 1657 11/03/20 2059 11/04/20 0756  GLUCAP 91 83 95 193* 176*   Lipid Profile: No results for input(s): CHOL, HDL, LDLCALC, TRIG, CHOLHDL, LDLDIRECT in the last 72 hours. Thyroid Function Tests: No results for input(s): TSH, T4TOTAL, FREET4, T3FREE, THYROIDAB in the last 72 hours. Anemia Panel: No results for input(s): VITAMINB12, FOLATE, FERRITIN, TIBC, IRON, RETICCTPCT in the last 72 hours. Sepsis Labs: No results for input(s): PROCALCITON, LATICACIDVEN in the last 168 hours.  Recent Results (from the past 240 hour(s))  Respiratory Panel by RT PCR (Flu A&B, Covid) - Nasopharyngeal Swab     Status: None   Collection Time: 10/29/20 12:58 PM   Specimen: Nasopharyngeal Swab  Result Value Ref Range Status   SARS Coronavirus 2 by RT PCR NEGATIVE NEGATIVE Final    Comment: (NOTE) SARS-CoV-2 target nucleic acids are NOT DETECTED.  The SARS-CoV-2 RNA is generally detectable in upper respiratoy specimens during the acute phase of infection. The lowest concentration of SARS-CoV-2 viral copies this assay can detect is 131 copies/mL. A negative result does not preclude  SARS-Cov-2 infection and should not be used as the sole basis for treatment or other patient management decisions. A negative result may occur with  improper specimen collection/handling, submission of specimen other than nasopharyngeal swab, presence of viral mutation(s) within the areas targeted by this assay, and inadequate number of viral copies (<131 copies/mL). A negative result must be combined with clinical observations, patient history, and epidemiological information. The expected result is  Negative.  Fact Sheet for Patients:  PinkCheek.be  Fact Sheet for Healthcare Providers:  GravelBags.it  This test is no t yet approved or cleared by the [place] FDA and  has been authorized for detection and/or diagnosis of SARS-CoV-2 by FDA under an Emergency Use Authorization (EUA). This EUA will remain  in effect (meaning this test can be used) for the duration of the COVID-19 declaration under Section 564(b)(1) of the Act, 21 U.S.C. section 360bbb-3(b)(1), unless the authorization is terminated or revoked sooner.     Influenza A by PCR NEGATIVE NEGATIVE Final   Influenza B by PCR NEGATIVE NEGATIVE Final    Comment: (NOTE) The Xpert Xpress SARS-CoV-2/FLU/RSV assay is intended as an aid in  the diagnosis of influenza from Nasopharyngeal swab specimens and  should not be used as a sole basis for treatment. Nasal washings and  aspirates are unacceptable for Xpert Xpress SARS-CoV-2/FLU/RSV  testing.  Fact Sheet for Patients: PinkCheek.be  Fact Sheet for Healthcare Providers: GravelBags.it  This test is not yet approved or cleared by the [place] FDA and  has been authorized for detection and/or diagnosis of SARS-CoV-2 by  FDA under an Emergency Use Authorization (EUA). This EUA will remain  in effect (meaning this test can be used) for the duration of the   Covid-19 declaration under Section 564(b)(1) of the Act, 21  U.S.C. section 360bbb-3(b)(1), unless the authorization is  terminated or revoked. Performed at [facility], [place] [address] [place], [place] [address]       Radiology Studies: No results found.  Scheduled Meds: . azaTHIOprine  50 mg Oral Daily  . collagenase   Topical Daily  . cycloSPORINE  75 mg Oral BID  . darbepoetin (ARANESP) injection - NON-DIALYSIS  100 mcg Subcutaneous Q Fri-1800  . Gerhardt's butt cream   Topical BID  . heparin injection (subcutaneous)  5,000 Units Subcutaneous Q8H  . insulin aspart  0-9 Units Subcutaneous TID WC  . insulin glargine  6 Units Subcutaneous BID  . levothyroxine  50 mcg Oral Daily  . metoprolol tartrate  12.5 mg Oral BID  . nutrition supplement (JUVEN)  1 packet Oral BID BM  . predniSONE  5 mg Oral Daily  . rosuvastatin  5 mg Oral QHS  . sertraline  25 mg Oral Daily  . sodium bicarbonate  650 mg Oral BID   Continuous Infusions:    LOS: 6 days   Time spent: 15min  Christino Mcglinchey C Dinah Lupa, DO [facility]  If 7PM-7AM, please contact night-coverage [email or web address]  11/04/2020, 8:24 AM

## 2020-11-04 NOTE — Progress Notes (Signed)
[facility] informed bp 170/78 hr 68 asymptomatic scheduled metoprolol 12.5 given. Arthor Captain LPN

## 2020-11-04 NOTE — Plan of Care (Signed)
  Problem: Education: Goal: Knowledge of General Education information will improve Description: Including pain rating scale, medication(s)/side effects and non-pharmacologic comfort measures Outcome: Progressing   Problem: Health Behavior/Discharge Planning: Goal: Ability to manage health-related needs will improve Outcome: Progressing   Problem: Clinical Measurements: Goal: Ability to maintain clinical measurements within normal limits will improve Outcome: Progressing Goal: Will remain free from infection Outcome: Progressing Goal: Diagnostic test results will improve Outcome: Progressing Goal: Respiratory complications will improve Outcome: Progressing Goal: Cardiovascular complication will be avoided Outcome: Progressing   Problem: Nutrition: Goal: Adequate nutrition will be maintained Outcome: Progressing   Problem: Activity: Goal: Risk for activity intolerance will decrease Outcome: Progressing   Problem: Coping: Goal: Level of anxiety will decrease Outcome: Progressing   Problem: Elimination: Goal: Will not experience complications related to bowel motility Outcome: Progressing Goal: Will not experience complications related to urinary retention Outcome: Progressing   Problem: Safety: Goal: Ability to remain free from injury will improve Outcome: Progressing   Problem: Skin Integrity: Goal: Risk for impaired skin integrity will decrease Outcome: Progressing

## 2020-11-05 DIAGNOSIS — N179 Acute kidney failure, unspecified: Secondary | ICD-10-CM | POA: Diagnosis not present

## 2020-11-05 DIAGNOSIS — N189 Chronic kidney disease, unspecified: Secondary | ICD-10-CM | POA: Diagnosis not present

## 2020-11-05 LAB — CBC
HCT: 27.6 % — ABNORMAL LOW (ref 36.0–46.0)
Hemoglobin: 8.7 g/dL — ABNORMAL LOW (ref 12.0–15.0)
MCH: 33.3 pg (ref 26.0–34.0)
MCHC: 31.5 g/dL (ref 30.0–36.0)
MCV: 105.7 fL — ABNORMAL HIGH (ref 80.0–100.0)
Platelets: 210 10*3/uL (ref 150–400)
RBC: 2.61 MIL/uL — ABNORMAL LOW (ref 3.87–5.11)
RDW: 18.8 % — ABNORMAL HIGH (ref 11.5–15.5)
WBC: 5.3 10*3/uL (ref 4.0–10.5)
nRBC: 0 % (ref 0.0–0.2)

## 2020-11-05 LAB — GLUCOSE, CAPILLARY
Glucose-Capillary: 237 mg/dL — ABNORMAL HIGH (ref 70–99)
Glucose-Capillary: 186 mg/dL — ABNORMAL HIGH (ref 70–99)
Glucose-Capillary: 77 mg/dL (ref 70–99)
Glucose-Capillary: 264 mg/dL — ABNORMAL HIGH (ref 70–99)

## 2020-11-05 LAB — BASIC METABOLIC PANEL
Anion gap: 9 (ref 5–15)
BUN: 89 mg/dL — ABNORMAL HIGH (ref 6–20)
CO2: 25 mmol/L (ref 22–32)
Calcium: 9.4 mg/dL (ref 8.9–10.3)
Chloride: 104 mmol/L (ref 98–111)
Creatinine, Ser: 2.21 mg/dL — ABNORMAL HIGH (ref 0.44–1.00)
GFR, Estimated: 25 mL/min — ABNORMAL LOW (ref >60)
Glucose, Bld: 262 mg/dL — ABNORMAL HIGH (ref 70–99)
Potassium: 4.9 mmol/L (ref 3.5–5.1)
Sodium: 138 mmol/L (ref 135–145)

## 2020-11-05 LAB — CYCLOSPORINE: Cyclosporine, LabCorp: 279 ng/mL (ref 100–400)

## 2020-11-05 NOTE — Progress Notes (Signed)
Physical Therapy Treatment Patient Details Name: [name] MRN: [id number] DOB: June 18, 1961 Today's Date: 11/05/2020    History of Present Illness 59 y.o. 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     PT Comments    Patient requires extensive assistance for bed mobility and attempting transfers. Patient requires maxA+2 for supine to sit and totalA+2 for sit>supine. Attempted lateral scoot transfer, unsuccessful with patient unable to utilize L LE to assist and use abdominals to maintain upright posture, demos constant posterior lean during attempted transfer with min-modA to maintain upright. Patient required totalA+2 for return back to bed due to hips sliding forward towards floor. Patient unaware of need for assist or safety. Patient required modA for rolling L<>R to perform pericare and remove soiled linens. Patient continues to be limited by impaired balance, generalized weakness, decreased activity tolerance, impaired postural control, and impaired functional mobility. Continue to recommend SNF for ongoing Physical Therapy.     Follow Up Recommendations  SNF     Equipment Recommendations  Wheelchair (measurements PT);Wheelchair cushion (measurements PT)    Recommendations for Other Services       Precautions / Restrictions Precautions Precautions: Fall Restrictions RLE Weight Bearing: Non weight bearing    Mobility  Bed Mobility Overal bed mobility: Needs Assistance Bed Mobility: Rolling;Supine to Sit;Sit to Supine Rolling: Mod assist   Supine to sit: Max assist;+2 for safety/equipment;+2 for physical assistance Sit to supine: Total assist;+2 for physical assistance;+2 for safety/equipment   General bed mobility comments: Required min-modA to maintain sitting EOB due to patient leaning too far in  multi-directions  Transfers Overall transfer level: Needs assistance Equipment used: None Transfers: Lateral/Scoot Transfers;Sit to/from Stand Sit to Stand: Max assist;+2 physical assistance;+2 safety/equipment;From elevated surface (unable to clear buttocks off bed)        Lateral/Scoot Transfers: Total assist (attempt- unsuccessful ) General transfer comment: Attempted lateral scoot transfer to chair. Patient unable to assist with transfer and began sliding forward off bed, TotalA+2 to get buttocks on bed, patient unaware of safety concerns. Patient will need lift for OOB to recliner.  Ambulation/Gait                 Stairs             Wheelchair Mobility    Modified Rankin (Stroke Patients Only)       Balance Overall balance assessment: Needs assistance Sitting-balance support: Bilateral upper extremity supported Sitting balance-Leahy Scale: Poor Sitting balance - Comments: posterior LOB with no self correction Postural control: Posterior lean                                  Cognition Arousal/Alertness: Awake/alert Behavior During Therapy: WFL for tasks assessed/performed Overall Cognitive Status: No family/caregiver present to determine baseline cognitive functioning                                 General Comments: decreased awareness of deficits and need for safety/assistance      Exercises      General Comments General comments (skin integrity, edema, etc.): Patient unaware of need for safety and assist throughtout session. Patient requires extensive assist and will need lift for OOB.  Pertinent Vitals/Pain Pain Assessment: Faces Faces Pain Scale: Hurts little more Pain Location: RLE, buttocks Pain Descriptors / Indicators: Aching;Guarding Pain Intervention(s): Limited activity within patient's tolerance;Monitored during session;Repositioned    Home Living                      Prior Function             PT Goals (current goals can now be found in the care plan section) Acute Rehab PT Goals Patient Stated Goal: I want to move better PT Goal Formulation: With patient Time For Goal Achievement: 11/16/20 Potential to Achieve Goals: Fair Progress towards PT goals: Progressing toward goals    Frequency    Min 2X/week      PT Plan Current plan remains appropriate    Co-evaluation PT/OT/SLP Co-Evaluation/Treatment: Yes Reason for Co-Treatment: For patient/therapist safety;To address functional/ADL transfers PT goals addressed during session: Mobility/safety with mobility        AM-PAC PT "6 Clicks" Mobility   Outcome Measure  Help needed turning from your back to your side while in a flat bed without using bedrails?: A Lot Help needed moving from lying on your back to sitting on the side of a flat bed without using bedrails?: A Lot Help needed moving to and from a bed to a chair (including a wheelchair)?: Total Help needed standing up from a chair using your arms (e.g., wheelchair or bedside chair)?: Total Help needed to walk in hospital room?: Total Help needed climbing 3-5 steps with a railing? : Total 6 Click Score: 8    End of Session Equipment Utilized During Treatment: Gait belt Activity Tolerance: Patient limited by fatigue Patient left: in bed;with call bell/phone within reach;with bed alarm set Nurse Communication: Mobility status PT Visit Diagnosis: Muscle weakness (generalized) (M62.81);History of falling (Z91.81);Pain     Time: 4650-3546 PT Time Calculation (min) (ACUTE ONLY): 42 min  Charges:  $Therapeutic Activity: 23-37 mins                     Perrin Maltese, PT, DPT Acute Rehabilitation Services Pager [phone] Office [phone]    [name] 11/05/2020, 12:52 PM

## 2020-11-05 NOTE — TOC Progression Note (Signed)
Transition of Care [facility]) - Progression Note    Patient Details  Name: [name] MRN: [id number] Date of Birth: Dec 11, 1961  Transition of Care [facility]) CM/SW [place], [place] Phone Number: 11/05/2020, 12:03 PM  Clinical Narrative:     CSW met with pt and presented offer. Pt expressed interest in [facility] and had questions specifically about their rehab and gym. Pt to discuss further with facility before decision.   Expected Discharge Plan: Skilled Nursing Facility Barriers to Discharge: Continued Medical Work up, SNF Pending bed offer  Expected Discharge Plan and Services Expected Discharge Plan: [place] In-house Referral: Clinical Social Work                                             Social Determinants of Health (SDOH) Interventions    Readmission Risk Interventions No flowsheet data found.

## 2020-11-05 NOTE — Progress Notes (Signed)
Occupational Therapy Treatment Patient Details Name: [name] MRN: [id number] DOB: [birth year] Today's Date: 11/05/2020    History of present illness 59 y.o. 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    OT comments  Pt continues to be limited by decreased strength, decreased ability to care for self and decreased activity tolerance. Pt unable to sit upright without maxA; no righting reactions in BUEs or trunk. Pt with very poor insight into the poor status of LLE and inability to bear wt through it/decreased sensation is a safety concern. Pt near maxA to total A for most LB ADL. Pt would greatly benefit from continued OT skilled services. OT following acutely.   Follow Up Recommendations  SNF    Equipment Recommendations  Wheelchair (measurements OT);Wheelchair cushion (measurements OT)    Recommendations for Other Services      Precautions / Restrictions Precautions Precautions: Fall Restrictions RLE Weight Bearing: Non weight bearing Other Position/Activity Restrictions: L foot ulcer to heal.  no WB orders.  Patient stating she can bear weight through L leg       Mobility Bed Mobility Overal bed mobility: Needs Assistance Bed Mobility: Rolling;Supine to Sit;Sit to Supine Rolling: Mod assist   Supine to sit: Max assist;+2 for safety/equipment;+2 for physical assistance Sit to supine: Total assist;+2 for physical assistance;+2 for safety/equipment   General bed mobility comments: Required min-modA to maintain sitting EOB due to patient leaning too far in multi-directions  Transfers Overall transfer level: Needs assistance Equipment used: None Transfers: Lateral/Scoot Transfers;Sit to/from Stand Sit to Stand: Max assist;+2 physical assistance;+2 safety/equipment;From elevated surface (unable to clear  buttocks off bed)        Lateral/Scoot Transfers: Total assist (attempt- unsuccessful ) General transfer comment: Attempted lateral scoot transfer to recliner. Patient unable to assist with transfer from EOB and began sliding forward off bed, TotalA+2 to get buttocks on bed, patient unaware of safety concerns. Patient will need lift for OOB to recliner.    Balance Overall balance assessment: Needs assistance Sitting-balance support: Bilateral upper extremity supported Sitting balance-Leahy Scale: Poor Sitting balance - Comments: unable to sit upright without maxA; no righting reactions Postural control: Posterior lean                                 ADL either performed or assessed with clinical judgement   ADL Overall ADL's : Needs assistance/impaired Eating/Feeding: Independent;Bed level   Grooming: Minimal assistance;Bed level                   Toilet Transfer: Total assistance;+2 for physical assistance;+2 for safety/equipment Toilet Transfer Details (indicate cue type and reason): attempting sliding transfer to recliner, but unable to  perform as pt sliding forward and totalA +2 Toileting- Clothing Manipulation and Hygiene: Total assistance Toileting - Clothing Manipulation Details (indicate cue type and reason): using purewick; unable to control bladder     Functional mobility during ADLs: Maximal assistance;Total assistance;+2 for physical assistance;+2 for safety/equipment General ADL Comments: Pt continues to be limited by decreased strength, decreased ability to care for self and decreased activity tolerance.      Vision   Vision Assessment?: No apparent visual deficits   Perception     Praxis      Cognition Arousal/Alertness: Awake/alert Behavior During Therapy: [facility]  for tasks assessed/performed Overall Cognitive Status: No family/caregiver present to determine baseline cognitive functioning                                  General Comments: decreased awareness of deficits and need for safety/assistance        Exercises     Shoulder Instructions       General Comments Pt with very poor insight into degree of damage in LLE and inabiltiy to bear wt through it/decreased sensation is a safety concern.    Pertinent Vitals/ Pain       Pain Assessment: Faces Faces Pain Scale: Hurts little more Pain Location: RLE, buttocks Pain Descriptors / Indicators: Aching;Guarding Pain Intervention(s): Limited activity within patient's tolerance;Monitored during session  Home Living                                          Prior Functioning/Environment              Frequency  Min 2X/week        Progress Toward Goals  OT Goals(current goals can now be found in the care plan section)  Progress towards OT goals: Progressing toward goals  Acute Rehab OT Goals Patient Stated Goal: I want to move better OT Goal Formulation: With patient Time For Goal Achievement: 11/14/20 Potential to Achieve Goals: Fair ADL Goals Pt Will Perform Grooming: with set-up;sitting Pt Will Perform Upper Body Bathing: with set-up;sitting Pt Will Perform Upper Body Dressing: with set-up;sitting Pt Will Transfer to Toilet: with mod assist;stand pivot transfer;bedside commode  Plan Discharge plan remains appropriate    Co-evaluation    PT/OT/SLP Co-Evaluation/Treatment: Yes Reason for Co-Treatment: Complexity of the patient's impairments (multi-system involvement);To address functional/ADL transfers   OT goals addressed during session: ADL's and self-care;Strengthening/ROM      AM-PAC OT "6 Clicks" Daily Activity     Outcome Measure   Help from another person eating meals?: None Help from another person taking care of personal grooming?: A Little Help from another person toileting, which includes using toliet, bedpan, or urinal?: Total Help from another person bathing (including washing, rinsing,  drying)?: A Lot Help from another person to put on and taking off regular upper body clothing?: A Little Help from another person to put on and taking off regular lower body clothing?: Total 6 Click Score: 14    End of Session Equipment Utilized During Treatment: Gait belt;Oxygen  OT Visit Diagnosis: Muscle weakness (generalized) (M62.81);History of falling (Z91.81);Pain Pain - Right/Left: Left Pain - part of body: Ankle and joints of foot   Activity Tolerance No increased pain   Patient Left in bed;with call bell/phone within reach;with bed alarm set   Nurse Communication Mobility status        Time: 7253-6644 OT Time Calculation (min): 41 min  Charges: OT General Charges $OT Visit: 1 Visit OT Treatments $Therapeutic Activity: 8-22 mins  Jefferey Pica, OTR/L Acute Rehabilitation Services Pager: [phone] Office: [phone]    Maylie Ashton C 11/05/2020, 4:50 PM

## 2020-11-05 NOTE — Progress Notes (Signed)
PROGRESS NOTE  [name] YQI:[id number] DOB: [birth year] DOA: 10/29/2020 PCP: [name], MD  Brief History   [name] a 59 y.o.femalewith medical history significant forDM, S/P renal and pancreas transplant about 10 years ago, Stage 3 CKD, HTN, s/p Rt BKA for osteomyelitiswho presents to the emergency room from the subacute rehab center for evaluation of abnormal lab values. Patient noted to have serum creatinine of 3.8 above her baseline of 1.27 from 3 weeks ago,hemoglobin of6.5g/dlcompared to her baseline of 7.7g/dl3 weeks ago. Her stools are brown in color. She denies having any hematemesis, hematochezia or melena stools. She denies any NSAID use.She denies having any nausea, no vomiting, no abdominal pain, no diarrhea, no urinary symptoms. She complains of feeling weak,bloatedand fatigued. Labs show sodium 137, potassium 3.7, chloride 102, bicarb 23, glucose 392, BUN 112, creatinine 3.05, calcium 8.6, white count 8.1, hemoglobin 6.7, hematocrit 22.4, MCV 116, RDW 18.6, platelet count 291. Respiratory viral panel is negative. Twelve-lead EKG reviewed by me shows sinus rhythm.  In the EDPatient noted to have worsening of her renal function with serum creatinine today on admission 3.05 compared to baseline of 1.27, potassium of 5.7, hemoglobin of 6.7 compared to baseline of 7.7 and serum glucose of 392.Patient was treated with [facility] in the emergency room and will be admitted to the hospital for further evaluation.  Creatinine initially improving with supportive care, now downtrending despite diuretics and albumin, currently holding diuretics in hopes to improve creatinine and urinary output..  Consultants  . Nephrology . Wound Care  Procedures  . None  Antibiotics   Anti-infectives (From admission, onward)   None    .  Subjective  No new complaints.  Objective   Vitals:  Vitals:   11/05/20 0519 11/05/20 1401  BP: (!) 169/77 134/66  Pulse: 79  68  Resp: 17 17  Temp: 98.6 F (37 C) 98.6 F (37 C)  SpO2: 90% 96%   Exam:  Constitutional:  . The patient is awake, alert, and oriented x 3. No acute distress. Respiratory:  . No increased work of breathing. . No wheezes, rales, or rhonchi . No tactile fremitus Cardiovascular:  . Regular rate and rhythm . No murmurs, ectopy, or gallups. . No lateral PMI. No thrills. Abdomen:  . Abdomen is soft, non-tender, non-distended . No hernias, masses, or organomegaly . Normoactive bowel sounds.  Musculoskeletal:  . No cyanosis, clubbing, or edema Skin:  . No rashes, lesions, ulcers . palpation of skin: no induration or nodules Neurologic:  . CN 2-12 intact . Sensation all 4 extremities intact Psychiatric:  . Mental status o Mood, affect appropriate o Orientation to person, place, time  . judgment and insight appear intact  I have personally reviewed the following:   Today's Data  . Vitals, BMP, CBC  Micro Data  . Nasal swab positive for MRSA  Imaging  . US renal transplant  Cardiology Data  . EKG  Scheduled Meds: . azaTHIOprine  50 mg Oral Daily  . collagenase   Topical Daily  . cycloSPORINE  75 mg Oral BID  . darbepoetin (ARANESP) injection - NON-DIALYSIS  100 mcg Subcutaneous Q Fri-1800  . Gerhardt's butt cream   Topical BID  . heparin injection (subcutaneous)  5,000 Units Subcutaneous Q8H  . insulin aspart  0-9 Units Subcutaneous TID WC  . insulin glargine  6 Units Subcutaneous BID  . levothyroxine  50 mcg Oral Daily  . metoprolol tartrate  12.5 mg Oral BID  . nutrition supplement (JUVEN)  1 packet Oral BID BM  . predniSONE  5 mg Oral Daily  . rosuvastatin  5 mg Oral QHS  . sertraline  25 mg Oral Daily  . sodium bicarbonate  650 mg Oral BID   Continuous Infusions:  Principal Problem:   Acute kidney injury superimposed on CKD (HCC) Active Problems:   Anemia secondary to renal failure   History of simultaneous kidney and pancreas transplant ([place])    Chronic kidney disease (CKD) stage G3a/A1, moderately decreased glomerular filtration rate (GFR) between 45-59 mL/min/1.73 square meter and albuminuria creatinine ratio less than 30 mg/g (HCC)   Hypertension   Diabetes mellitus with hyperglycemia (HCC)   LOS: 7 days   A & P  Acute kidney injury superimposed on stage IIIA chronic kidney disease Likely secondary to intravascular volume depletion from anemia Hold ACE inhibitors/diuretics as well as other nephrotoxic agents -nephrology recommending torsemide once able to restart diuretics Creatinine finally downtrending minimally over the past 24 hours. Nephrology following, appreciate insight and recommendations - clarified to CKD3A (not 3B as previously noted) Currently off diuretics per nephrology Recent Labs       Lab Results  Component Value Date   CREATININE 2.47 (H) 11/04/2020   CREATININE 2.67 (H) 11/03/2020   CREATININE 2.46 (H) 11/02/2020      Diffuse anasarca, stable Most likely secondary to renal disease Protein remains low - encourage increased PO intake Diuretics on hold as above Labs (Brief)     Component Value Date/Time   NA 137 11/04/2020 0513   K 4.6 11/04/2020 0513   CL 102 11/04/2020 0513   CO2 22 11/04/2020 0513   GLUCOSE 209 (H) 11/04/2020 0513   BUN 95 (H) 11/04/2020 0513   CREATININE 2.47 (H) 11/04/2020 0513   CALCIUM 9.9 11/04/2020 0513   PROT 4.5 (L) 10/06/2020 0712   ALBUMIN 3.3 (L) 11/04/2020 0513   AST 47 (H) 10/06/2020 0712   ALT 24 10/06/2020 0712   ALKPHOS 63 10/06/2020 0712   BILITOT 0.4 10/06/2020 0712   GFRNONAA 22 (L) 11/04/2020 0513   GFRAA 39 (L) 09/29/2020 0639     Anemia secondary to chronic kidney disease, POA Iron panel confirms not iron deficiency -likely anemia of chronic disease, continue supportive care, nephrology following -defer use of EPO or similar to their expertise Labs (Brief)          Component Value Date/Time   IRON 95 10/29/2020 2153    TIBC 143 (L) 10/29/2020 2153   FERRITIN 1,032 (H) 10/29/2020 2153   IRONPCTSAT 67 (H) 10/29/2020 2153    Status post 2 units of packed RBC given at admission  Pressure ulcer stage III on sacral area and diabetic ulcer on left heel, POA.  Continue supportive care, bandages per nursing protocol  Status post renal and pancreatic transplant Continue prednisone, cyclosporine and azathioprine  Hyperkalemia, minimal but ongoing Most likely related to ACE inhibitor use and worsening renal function Holding both lisinopril and furosemide in the setting of above Patient received Lokelma in the ER x1 -if continues to worsen will repeat dose Recent Labs       Lab Results  Component Value Date   K 4.6 11/04/2020     Uncontrolled diabetes mellitus with complications of chronic kidney disease stage III and hyperglycemia Continue sliding scale insulin, hypoglycemic protocol, diabetic diet Morning hypoglycemia resolving after decreasing the long-acting to 6 units twice daily Recent Labs       Lab Results  Component Value Date   HGBA1C 7.2 (H)  09/26/2020     Hypertension Continue metoprolol  Hypothyroidism Continue Synthroid  Depression  Continue sertraline  DVT prophylaxis:SCDs Code Status:Full code Family Communication: None present  Status is: Inpatient  Dispo: The patient is from: Home  Anticipated d/c is to: SNF - per PT recommendations  Anticipated d/c date is: 48 hours pending clinical course  Patient currently not medically stable for discharge given need for ongoing monitoring of hemoglobin, creatinine and vital signs given acute illnesses as above    Rosie Torrez, DO [facility] Direct contact: see [email or web address]  7PM-7AM contact night coverage as above 11/05/2020, 5:58 PM  LOS: 7 days

## 2020-11-06 DIAGNOSIS — N179 Acute kidney failure, unspecified: Secondary | ICD-10-CM | POA: Diagnosis not present

## 2020-11-06 DIAGNOSIS — N189 Chronic kidney disease, unspecified: Secondary | ICD-10-CM | POA: Diagnosis not present

## 2020-11-06 LAB — CBC WITH DIFFERENTIAL/PLATELET
Abs Immature Granulocytes: 0.05 10*3/uL (ref 0.00–0.07)
Basophils Absolute: 0 10*3/uL (ref 0.0–0.1)
Basophils Relative: 1 %
Eosinophils Absolute: 0.1 10*3/uL (ref 0.0–0.5)
Eosinophils Relative: 2 %
HCT: 27.9 % — ABNORMAL LOW (ref 36.0–46.0)
Hemoglobin: 8.7 g/dL — ABNORMAL LOW (ref 12.0–15.0)
Immature Granulocytes: 1 %
Lymphocytes Relative: 20 %
Lymphs Abs: 1.2 10*3/uL (ref 0.7–4.0)
MCH: 33.1 pg (ref 26.0–34.0)
MCHC: 31.2 g/dL (ref 30.0–36.0)
MCV: 106.1 fL — ABNORMAL HIGH (ref 80.0–100.0)
Monocytes Absolute: 0.6 10*3/uL (ref 0.1–1.0)
Monocytes Relative: 9 %
Neutro Abs: 4 10*3/uL (ref 1.7–7.7)
Neutrophils Relative %: 67 %
Platelets: 212 10*3/uL (ref 150–400)
RBC: 2.63 MIL/uL — ABNORMAL LOW (ref 3.87–5.11)
RDW: 18.9 % — ABNORMAL HIGH (ref 11.5–15.5)
WBC: 5.9 10*3/uL (ref 4.0–10.5)
nRBC: 0 % (ref 0.0–0.2)

## 2020-11-06 LAB — BASIC METABOLIC PANEL
Anion gap: 11 (ref 5–15)
BUN: 91 mg/dL — ABNORMAL HIGH (ref 6–20)
CO2: 22 mmol/L (ref 22–32)
Calcium: 9.3 mg/dL (ref 8.9–10.3)
Chloride: 104 mmol/L (ref 98–111)
Creatinine, Ser: 2.27 mg/dL — ABNORMAL HIGH (ref 0.44–1.00)
GFR, Estimated: 24 mL/min — ABNORMAL LOW (ref >60)
Glucose, Bld: 225 mg/dL — ABNORMAL HIGH (ref 70–99)
Potassium: 5.1 mmol/L (ref 3.5–5.1)
Sodium: 137 mmol/L (ref 135–145)

## 2020-11-06 LAB — GLUCOSE, CAPILLARY
Glucose-Capillary: 237 mg/dL — ABNORMAL HIGH (ref 70–99)
Glucose-Capillary: 193 mg/dL — ABNORMAL HIGH (ref 70–99)
Glucose-Capillary: 177 mg/dL — ABNORMAL HIGH (ref 70–99)
Glucose-Capillary: 200 mg/dL — ABNORMAL HIGH (ref 70–99)

## 2020-11-06 MED ORDER — INSULIN GLARGINE 100 UNIT/ML ~~LOC~~ SOLN
8.0000 [IU] | Freq: Two times a day (BID) | SUBCUTANEOUS | Status: DC
  Administered 2020-11-06 – 2020-11-07 (×2): 8 [IU] via SUBCUTANEOUS
  Filled 2020-11-06 (×4): qty 0.08

## 2020-11-06 NOTE — TOC Progression Note (Signed)
Transition of Care [facility]) - Progression Note    Patient Details  Name: [name] MRN: [id number] Date of Birth: 04/19/1961  Transition of Care [facility]) CM/SW [place],  Phone Number: 11/06/2020, 10:43 AM  Clinical Narrative:     CSW met with pt to discuss choice. Pt is leaned over in bed with throw up bag near mouth. She explains she has not been able to speak with [facility] and asks CSW to come back later when she is not feeling ill.   Expected Discharge Plan: Skilled Nursing Facility Barriers to Discharge: Continued Medical Work up, SNF Pending bed offer  Expected Discharge Plan and Services Expected Discharge Plan: [place] In-house Referral: Clinical Social Work                                             Social Determinants of Health (SDOH) Interventions    Readmission Risk Interventions No flowsheet data found.

## 2020-11-06 NOTE — Progress Notes (Signed)
Inpatient Diabetes Program Recommendations  AACE/ADA: New Consensus Statement on Inpatient Glycemic Control (2015)  Target Ranges:  Prepandial:   less than 140 mg/dL      Peak postprandial:   less than 180 mg/dL (1-2 hours)      Critically ill patients:  140 - 180 mg/dL   Lab Results  Component Value Date   GLUCAP 237 (H) 11/06/2020   HGBA1C 7.2 (H) 09/26/2020    Review of Glycemic Control Results for [name], [name] (MRN [id number]) as of 11/06/2020 11:09  Ref. Range 11/05/2020 11:30 11/05/2020 16:37 11/05/2020 21:15 11/06/2020 07:55  Glucose-Capillary Latest Ref Range: 70 - 99 mg/dL 186 (H) 77 264 (H) 237 (H)   Diabetes history: Type 2 DM Outpatient Diabetes medications: Semglee 24 units QHS, Adelog 2 units TID Current orders for Inpatient glycemic control: Lantus 6 units BID, Novolog 0-9 units TID Prednisone 5 mg QD  Inpatient Diabetes Program Recommendations:    Consider increasing Lantus to 8 units BID.   Thanks, Bronson Curb, MSN, RNC-OB Diabetes Coordinator [phone] (8a-5p)

## 2020-11-06 NOTE — Progress Notes (Signed)
PROGRESS NOTE  [name] ZOX:[id number] DOB: Feb 25, 1961 DOA: 10/29/2020 PCP: [name], MD  Brief History   [name] a 59 y.o.femalewith medical history significant forDM, S/P renal and pancreas transplant about 10 years ago, Stage 3 CKD, HTN, s/p Rt BKA for osteomyelitiswho presents to the emergency room from the subacute rehab center for evaluation of abnormal lab values. Patient noted to have serum creatinine of 3.8 above her baseline of 1.27 from 3 weeks ago,hemoglobin of6.5g/dlcompared to her baseline of 7.7g/dl3 weeks ago. Her stools are brown in color. She denies having any hematemesis, hematochezia or melena stools. She denies any NSAID use.She denies having any nausea, no vomiting, no abdominal pain, no diarrhea, no urinary symptoms. She complains of feeling weak,bloatedand fatigued. Labs show sodium 137, potassium 3.7, chloride 102, bicarb 23, glucose 392, BUN 112, creatinine 3.05, calcium 8.6, white count 8.1, hemoglobin 6.7, hematocrit 22.4, MCV 116, RDW 18.6, platelet count 291. Respiratory viral panel is negative. Twelve-lead EKG reviewed by me shows sinus rhythm.  In the EDPatient noted to have worsening of her renal function with serum creatinine today on admission 3.05 compared to baseline of 1.27, potassium of 5.7, hemoglobin of 6.7 compared to baseline of 7.7 and serum glucose of 392.Patient was treated with [facility] in the emergency room and will be admitted to the hospital for further evaluation.  Creatinine initially improving with supportive care, now appears to have stabilized in the 2.20-2.3 range. Urine output fair. Continue to monitor. Consultants  . Nephrology . Wound Care  Procedures  . None  Antibiotics   Anti-infectives (From admission, onward)   None     Subjective  No new complaints.  Objective   Vitals:  Vitals:   11/06/20 0437 11/06/20 1305  BP: (!) 166/80 132/69  Pulse: 71 70  Resp: 16   Temp: 98.2 F (36.8  C) 98.4 F (36.9 C)  SpO2: 97% 92%   Exam:  Constitutional:  . The patient is awake, alert, and oriented x 3. No acute distress. Respiratory:  . No increased work of breathing. . No wheezes, rales, or rhonchi . No tactile fremitus Cardiovascular:  . Regular rate and rhythm . No murmurs, ectopy, or gallups. . No lateral PMI. No thrills. Abdomen:  . Abdomen is soft, non-tender, non-distended . No hernias, masses, or organomegaly . Normoactive bowel sounds.  Musculoskeletal:  . No cyanosis, clubbing, or edema Skin:  . No rashes, lesions, ulcers . palpation of skin: no induration or nodules Neurologic:  . CN 2-12 intact . Sensation all 4 extremities intact Psychiatric:  . Mental status o Mood, affect appropriate o Orientation to person, place, time  . judgment and insight appear intact  I have personally reviewed the following:   Today's Data  . Vitals, BMP, CBC, Glucoses  Micro Data  . Nasal swab positive for MRSA  Imaging  . US renal transplant  Cardiology Data  . EKG  Scheduled Meds: . azaTHIOprine  50 mg Oral Daily  . collagenase   Topical Daily  . cycloSPORINE  75 mg Oral BID  . darbepoetin (ARANESP) injection - NON-DIALYSIS  100 mcg Subcutaneous Q Fri-1800  . Gerhardt's butt cream   Topical BID  . heparin injection (subcutaneous)  5,000 Units Subcutaneous Q8H  . insulin aspart  0-9 Units Subcutaneous TID WC  . insulin glargine  8 Units Subcutaneous BID  . levothyroxine  50 mcg Oral Daily  . metoprolol tartrate  12.5 mg Oral BID  . nutrition supplement (JUVEN)  1 packet Oral  BID BM  . predniSONE  5 mg Oral Daily  . rosuvastatin  5 mg Oral QHS  . sertraline  25 mg Oral Daily  . sodium bicarbonate  650 mg Oral BID   Continuous Infusions:  Principal Problem:   Acute kidney injury superimposed on CKD (HCC) Active Problems:   Anemia secondary to renal failure   History of simultaneous kidney and pancreas transplant ([place])   Chronic kidney disease  (CKD) stage G3a/A1, moderately decreased glomerular filtration rate (GFR) between 45-59 mL/min/1.73 square meter and albuminuria creatinine ratio less than 30 mg/g (HCC)   Hypertension   Diabetes mellitus with hyperglycemia (HCC)   LOS: 8 days   A & P  Acute kidney injury superimposed on stage IIIA chronic kidney disease: Likely secondary to intravascular volume depletion from anemia.  ACE inhibitors/diuretics as well as other nephrotoxic agents have been held. Creatinine finally downtrending minimally over the past 24 hours. Nephrology following, appreciate insight and recommendations - clarified to CKD3A (not 3B as previously noted). Currently off diuretics per nephrology.  Creatinine appears to have stabilized at 2.2.-2.3. Monitor.  Nephrology recommending torsemide once able to restart diuretics.  Diffuse anasarca, stable. Most likely secondary to renal disease and mild hypoalbuminemia. PO intake encouraged.  Diuretics on hold as above  Anemia secondary to chronic kidney disease, POA: Likely due to Anemia of chronic disease given low TIBC and high ferritin. Continue supportive care, nephrology following. Defer use of EPO or similar to their expertise. The patient was transfused 2 units of PRBC's at admission.   Pressure ulcer stage III on sacral area and diabetic ulcer on left heel, POA:  Continue supportive care, bandages per nursing protocol.   Status post renal and pancreatic transplant: Continue prednisone, cyclosporine and azathioprine.  Hyperkalemia: Resolved. Most likely related to ACE inhibitor use and worsening renal function. Holding both lisinopril and furosemide in the setting of above.  Uncontrolled diabetes mellitus with complications of chronic kidney disease stage III and hyperglycemia: The patient is currently receiving 8 units of lantus BID with FSBS and SSI. Glucoses over the past 24 hours have been 77-237. HbA1c on 09/26/2020 is 7.2.  Hypertension: Continue  metoprolol.  Hypothyroidism: Continue Synthroid  Depression: Continue sertraline  DVT prophylaxis:SCDs Code Status:Full code Family Communication: None present  Status is: Inpatient  Dispo: The patient is from: Home  Anticipated d/c is to: SNF - per PT recommendations  Anticipated d/c date is: 48 hours pending clinical course  Patient currently not medically stable for discharge given need for ongoing monitoring of hemoglobin, creatinine and vital signs given acute illnesses as above  Irbin Fines, DO [facility] Direct contact: see [email or web address]  7PM-7AM contact night coverage as above 11/06/2020, 3:41 PM  LOS: 7 days

## 2020-11-06 NOTE — TOC Progression Note (Addendum)
Transition of Care [facility]) - Progression Note    Patient Details  Name: [name] MRN: [id number] Date of Birth: 1961/02/14  Transition of Care [facility]) CM/SW [place], [place] Phone Number: 11/06/2020, 2:25 PM  Clinical Narrative:     CSW met with pt. She confirmed she wants [facility]. CSW contacted Elizabeth with [facility]. Juliann Pulse confirmed they would have a female bed available for Saturday 11/13. Juliann Pulse will start insurance auth. Covid test will be needed within 24 hour of d/c. Pt states she is vaccinated  Expected Discharge Plan: Skilled Nursing Facility Barriers to Discharge: Continued Medical Work up, SNF Pending bed offer  Expected Discharge Plan and Services Expected Discharge Plan: [place] In-house Referral: Clinical Social Work                                             Social Determinants of Health (SDOH) Interventions    Readmission Risk Interventions No flowsheet data found.

## 2020-11-07 DIAGNOSIS — N1831 Chronic kidney disease, stage 3a: Secondary | ICD-10-CM | POA: Diagnosis not present

## 2020-11-07 DIAGNOSIS — T8619 Other complication of kidney transplant: Secondary | ICD-10-CM | POA: Diagnosis not present

## 2020-11-07 DIAGNOSIS — E039 Hypothyroidism, unspecified: Secondary | ICD-10-CM | POA: Diagnosis not present

## 2020-11-07 DIAGNOSIS — M6259 Muscle wasting and atrophy, not elsewhere classified, multiple sites: Secondary | ICD-10-CM | POA: Diagnosis not present

## 2020-11-07 DIAGNOSIS — R10817 Generalized abdominal tenderness: Secondary | ICD-10-CM | POA: Diagnosis not present

## 2020-11-07 DIAGNOSIS — E1165 Type 2 diabetes mellitus with hyperglycemia: Secondary | ICD-10-CM | POA: Diagnosis not present

## 2020-11-07 DIAGNOSIS — E872 Acidosis: Secondary | ICD-10-CM | POA: Diagnosis not present

## 2020-11-07 DIAGNOSIS — N189 Chronic kidney disease, unspecified: Secondary | ICD-10-CM | POA: Diagnosis not present

## 2020-11-07 DIAGNOSIS — L89620 Pressure ulcer of left heel, unstageable: Secondary | ICD-10-CM | POA: Diagnosis not present

## 2020-11-07 DIAGNOSIS — Z79899 Other long term (current) drug therapy: Secondary | ICD-10-CM | POA: Diagnosis not present

## 2020-11-07 DIAGNOSIS — Z7401 Bed confinement status: Secondary | ICD-10-CM | POA: Diagnosis not present

## 2020-11-07 DIAGNOSIS — Z89511 Acquired absence of right leg below knee: Secondary | ICD-10-CM | POA: Diagnosis not present

## 2020-11-07 DIAGNOSIS — Z9483 Pancreas transplant status: Secondary | ICD-10-CM | POA: Diagnosis not present

## 2020-11-07 DIAGNOSIS — I10 Essential (primary) hypertension: Secondary | ICD-10-CM | POA: Diagnosis not present

## 2020-11-07 DIAGNOSIS — Z7409 Other reduced mobility: Secondary | ICD-10-CM | POA: Diagnosis not present

## 2020-11-07 DIAGNOSIS — K920 Hematemesis: Secondary | ICD-10-CM | POA: Diagnosis not present

## 2020-11-07 DIAGNOSIS — M2550 Pain in unspecified joint: Secondary | ICD-10-CM | POA: Diagnosis not present

## 2020-11-07 DIAGNOSIS — L89153 Pressure ulcer of sacral region, stage 3: Secondary | ICD-10-CM | POA: Diagnosis not present

## 2020-11-07 DIAGNOSIS — N1330 Unspecified hydronephrosis: Secondary | ICD-10-CM | POA: Diagnosis not present

## 2020-11-07 DIAGNOSIS — F32A Depression, unspecified: Secondary | ICD-10-CM | POA: Diagnosis not present

## 2020-11-07 DIAGNOSIS — Z20822 Contact with and (suspected) exposure to covid-19: Secondary | ICD-10-CM | POA: Diagnosis not present

## 2020-11-07 DIAGNOSIS — Z940 Kidney transplant status: Secondary | ICD-10-CM | POA: Diagnosis not present

## 2020-11-07 DIAGNOSIS — F339 Major depressive disorder, recurrent, unspecified: Secondary | ICD-10-CM | POA: Diagnosis not present

## 2020-11-07 DIAGNOSIS — R531 Weakness: Secondary | ICD-10-CM | POA: Diagnosis not present

## 2020-11-07 DIAGNOSIS — R9431 Abnormal electrocardiogram [ECG] [EKG]: Secondary | ICD-10-CM | POA: Diagnosis not present

## 2020-11-07 DIAGNOSIS — R112 Nausea with vomiting, unspecified: Secondary | ICD-10-CM | POA: Diagnosis not present

## 2020-11-07 DIAGNOSIS — K2100 Gastro-esophageal reflux disease with esophagitis, without bleeding: Secondary | ICD-10-CM | POA: Diagnosis not present

## 2020-11-07 DIAGNOSIS — Z741 Need for assistance with personal care: Secondary | ICD-10-CM | POA: Diagnosis not present

## 2020-11-07 DIAGNOSIS — E875 Hyperkalemia: Secondary | ICD-10-CM | POA: Diagnosis not present

## 2020-11-07 DIAGNOSIS — R1900 Intra-abdominal and pelvic swelling, mass and lump, unspecified site: Secondary | ICD-10-CM | POA: Diagnosis not present

## 2020-11-07 DIAGNOSIS — R601 Generalized edema: Secondary | ICD-10-CM | POA: Diagnosis not present

## 2020-11-07 DIAGNOSIS — Z794 Long term (current) use of insulin: Secondary | ICD-10-CM | POA: Diagnosis not present

## 2020-11-07 DIAGNOSIS — K2211 Ulcer of esophagus with bleeding: Secondary | ICD-10-CM | POA: Diagnosis not present

## 2020-11-07 DIAGNOSIS — D631 Anemia in chronic kidney disease: Secondary | ICD-10-CM | POA: Diagnosis not present

## 2020-11-07 DIAGNOSIS — N179 Acute kidney failure, unspecified: Secondary | ICD-10-CM | POA: Diagnosis not present

## 2020-11-07 LAB — BASIC METABOLIC PANEL
Anion gap: 8 (ref 5–15)
BUN: 89 mg/dL — ABNORMAL HIGH (ref 6–20)
CO2: 24 mmol/L (ref 22–32)
Calcium: 9.2 mg/dL (ref 8.9–10.3)
Chloride: 105 mmol/L (ref 98–111)
Creatinine, Ser: 2.25 mg/dL — ABNORMAL HIGH (ref 0.44–1.00)
GFR, Estimated: 24 mL/min — ABNORMAL LOW (ref >60)
Glucose, Bld: 183 mg/dL — ABNORMAL HIGH (ref 70–99)
Potassium: 5 mmol/L (ref 3.5–5.1)
Sodium: 137 mmol/L (ref 135–145)

## 2020-11-07 LAB — CBC WITH DIFFERENTIAL/PLATELET
Abs Immature Granulocytes: 0.03 10*3/uL (ref 0.00–0.07)
Basophils Absolute: 0 10*3/uL (ref 0.0–0.1)
Basophils Relative: 1 %
Eosinophils Absolute: 0.1 10*3/uL (ref 0.0–0.5)
Eosinophils Relative: 2 %
HCT: 27 % — ABNORMAL LOW (ref 36.0–46.0)
Hemoglobin: 8.4 g/dL — ABNORMAL LOW (ref 12.0–15.0)
Immature Granulocytes: 1 %
Lymphocytes Relative: 22 %
Lymphs Abs: 1.1 10*3/uL (ref 0.7–4.0)
MCH: 33.1 pg (ref 26.0–34.0)
MCHC: 31.1 g/dL (ref 30.0–36.0)
MCV: 106.3 fL — ABNORMAL HIGH (ref 80.0–100.0)
Monocytes Absolute: 0.5 10*3/uL (ref 0.1–1.0)
Monocytes Relative: 9 %
Neutro Abs: 3.3 10*3/uL (ref 1.7–7.7)
Neutrophils Relative %: 65 %
Platelets: 193 10*3/uL (ref 150–400)
RBC: 2.54 MIL/uL — ABNORMAL LOW (ref 3.87–5.11)
RDW: 19.3 % — ABNORMAL HIGH (ref 11.5–15.5)
WBC: 5.1 10*3/uL (ref 4.0–10.5)
nRBC: 0 % (ref 0.0–0.2)

## 2020-11-07 LAB — SARS CORONAVIRUS 2 BY RT PCR (HOSPITAL ORDER, PERFORMED IN ~~LOC~~ HOSPITAL LAB): SARS Coronavirus 2: NEGATIVE

## 2020-11-07 LAB — GLUCOSE, CAPILLARY
Glucose-Capillary: 133 mg/dL — ABNORMAL HIGH (ref 70–99)
Glucose-Capillary: 179 mg/dL — ABNORMAL HIGH (ref 70–99)

## 2020-11-07 MED ORDER — INSULIN GLARGINE 100 UNIT/ML ~~LOC~~ SOLN
8.0000 [IU] | Freq: Two times a day (BID) | SUBCUTANEOUS | 11 refills | Status: DC
Start: 2020-11-07 — End: 2020-12-23

## 2020-11-07 MED ORDER — HYDROCODONE-ACETAMINOPHEN 5-325 MG PO TABS: 1.0000 | ORAL_TABLET | ORAL | 0 refills | Status: DC | PRN

## 2020-11-07 MED ORDER — ROSUVASTATIN CALCIUM 5 MG PO TABS
5.0000 mg | ORAL_TABLET | Freq: Every day | ORAL | 0 refills | Status: DC
Start: 2020-11-07 — End: 2021-02-25

## 2020-11-07 MED ORDER — COLLAGENASE 250 UNIT/GM EX OINT
TOPICAL_OINTMENT | Freq: Every day | CUTANEOUS | 0 refills | Status: DC
Start: 2020-11-08 — End: 2021-03-02

## 2020-11-07 MED ORDER — GERHARDT'S BUTT CREAM
1.0000 | TOPICAL_CREAM | Freq: Two times a day (BID) | CUTANEOUS | 0 refills | Status: DC
Start: 2020-11-07 — End: 2020-12-08

## 2020-11-07 NOTE — TOC Progression Note (Addendum)
Transition of Care [facility]) - Progression Note    Patient Details  Name: [name] MRN: [id number] Date of Birth: 11-24-1961  Transition of Care [facility]) CM/SW [place], [place] Phone Number: 11/07/2020, 10:55 AM  Clinical Narrative:     CSW confirmed with pt that SNF request is for short term rehab and she plans to return home with her husband afterwards.   Juliann Pulse from [facility] confirmed with CSW that pt has bed tomorrow, 11/08/20, and that insurance Josem Kaufmann has been received. CSW has requested covid swab. Pt to d/c tomorrow to [facility] pending covid swab and medical stability.   1230: CSW informed by Juliann Pulse that bed available today. Per MD, pt is medically ready for d/c to snf. CSW discussed with pt. Pt initially expresses that she does not want to go today but after CSW explains MD's evaluation determined her stable, pt is agreeable. CSW too coordinate d/c to [place] care.    Expected Discharge Plan: Skilled Nursing Facility Barriers to Discharge: Continued Medical Work up, SNF Pending bed offer  Expected Discharge Plan and Services Expected Discharge Plan: [place] In-house Referral: Clinical Social Work                                             Social Determinants of Health (SDOH) Interventions    Readmission Risk Interventions No flowsheet data found.

## 2020-11-07 NOTE — Progress Notes (Signed)
Physical Therapy Treatment Patient Details Name: [name] MRN: [id number] DOB: Jul 01, 1961 Today's Date: 11/07/2020    History of Present Illness 59 y.o. 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     PT Comments    Session focused on functional exercises at bed level for LE and abdominal strengthening. Patient states if she is able to put her shoe on then she would be able to stand. Educated patient about weakness and sensory deficits in L LE and safety concerns with standing. Encouraged patient that bed level exercises are steps towards being able to stand. Patient continues to be limited by generalized weakness, decreased activity tolerance, impaired balance, impaired postural control, and impaired functional mobility. Continue to recommend SNF for ongoing Physical Therapy.     Follow Up Recommendations  SNF     Equipment Recommendations  Wheelchair (measurements PT);Wheelchair cushion (measurements PT)    Recommendations for Other Services       Precautions / Restrictions Precautions Precautions: Fall Restrictions Weight Bearing Restrictions: Yes RLE Weight Bearing: Non weight bearing Other Position/Activity Restrictions: L foot ulcer to heal.  no WB orders.  Patient stating she can bear weight through L leg    Mobility  Bed Mobility Overal bed mobility: Needs Assistance Bed Mobility: Rolling Rolling: Min assist            Transfers                    Ambulation/Gait                 Stairs             Wheelchair Mobility    Modified Rankin (Stroke Patients Only)       Balance Overall balance assessment: Needs assistance Sitting-balance support: Single extremity supported (long sit) Sitting balance-Leahy Scale: Poor Sitting balance - Comments: unable to long sit  in bed without maxA Postural control: Posterior lean                                  Cognition Arousal/Alertness: Awake/alert Behavior During Therapy: WFL for tasks assessed/performed Overall Cognitive Status: No family/caregiver present to determine baseline cognitive functioning                                 General Comments: decreased awareness of deficits and need for safety/assistance      Exercises General Exercises - Lower Extremity Ankle Circles/Pumps: AROM;Left;5 reps Quad Sets: AROM;Left;10 reps Heel Slides: AAROM;5 reps;Left Other Exercises Other Exercises: Pull to sit using bedrails x 10, cues for use of abdominals rather than solely UEs    General Comments        Pertinent Vitals/Pain Pain Assessment: Faces Faces Pain Scale: Hurts even more Pain Location: RLE, buttocks Pain Descriptors / Indicators: Aching;Guarding Pain Intervention(s): Limited activity within patient's tolerance;Monitored during session    Home Living                      Prior Function            PT Goals (current goals can now be found in the care plan section) Acute Rehab PT Goals Patient Stated Goal: I want to move  better PT Goal Formulation: With patient Time For Goal Achievement: 11/16/20 Potential to Achieve Goals: Fair Progress towards PT goals: Progressing toward goals    Frequency    Min 2X/week      PT Plan Current plan remains appropriate    Co-evaluation              AM-PAC PT "6 Clicks" Mobility   Outcome Measure  Help needed turning from your back to your side while in a flat bed without using bedrails?: A Lot Help needed moving from lying on your back to sitting on the side of a flat bed without using bedrails?: A Lot Help needed moving to and from a bed to a chair (including a wheelchair)?: Total Help needed standing up from a chair using your arms (e.g., wheelchair or bedside chair)?: Total Help needed to  walk in hospital room?: Total Help needed climbing 3-5 steps with a railing? : Total 6 Click Score: 8    End of Session   Activity Tolerance: Patient tolerated treatment well Patient left: in bed;with call bell/phone within reach;with bed alarm set Nurse Communication: Mobility status PT Visit Diagnosis: Muscle weakness (generalized) (M62.81);History of falling (Z91.81);Pain Pain - Right/Left: Right Pain - part of body: Leg     Time: 0300-9233 PT Time Calculation (min) (ACUTE ONLY): 27 min  Charges:  $Therapeutic Exercise: 23-37 mins                     Perrin Maltese, PT, DPT Acute Rehabilitation Services Pager [phone] Office [phone]    [name] 11/07/2020, 12:23 PM

## 2020-11-07 NOTE — TOC Transition Note (Addendum)
Transition of Care [facility]) - CM/SW Discharge Note   Patient Details  Name: [name] MRN: [id number] Date of Birth: 1961-11-27  Transition of Care [facility]) CM/SW Contact:  Bethann Berkshire, [place] Phone Number: 11/07/2020, 3:41 PM   Clinical Narrative:     Patient will DC to: [place] Anticipated DC date: 11/07/20 Family notified: Pt to notify family Transport by: Corey Harold   Per MD patient ready for DC to [facility] . RN, patient, and facility notified of DC. Discharge Summary and FL2 sent to facility. RN to call report prior to discharge [phone] Room 128B). DC packet on chart. Ambulance transport requested for patient.   CSW will sign off for now as social work intervention is no longer needed. Please consult [place] again if new needs arise.    Final next level of care: Skilled Nursing Facility Barriers to Discharge: No Barriers Identified   Patient Goals and CMS Choice Patient states their goals for this hospitalization and ongoing recovery are:: [place] CMS Medicare.gov Compare Post Acute Care list provided to:: Patient Choice offered to / list presented to : Patient  Discharge Placement              Patient chooses bed at: [facility] Patient to be transferred to facility by: [facility]      Discharge Plan and Services In-house Referral: Clinical Social Work                                   Social Determinants of Health (SDOH) Interventions     Readmission Risk Interventions No flowsheet data found.

## 2020-11-07 NOTE — Discharge Summary (Signed)
Physician Discharge Summary  [name] LFY:[id number] DOB: 01-11-1961 DOA: 10/29/2020  PCP: [name], MD  Admit date: 10/29/2020 Discharge date: 11/07/2020  Recommendations for Outpatient Follow-up:  1. Discharge to SNF. Chemistry should be checked and reported to facility physician weekly. 2. Follow up with PCP in 7-10 days after discharge from SNF.  3. Follow up with nephrology within 1 week of discharge. 4. Wound care to sacral decubitus ulcer with Gerhardt's Butt Cream and to heel with Santyl ointment daily. 5. Check glucoses daily and for any lethargy, agitation, or other change causing concern for hypoglycemia.  Discharge Diagnoses: Principal diagnosis is #1 1. AKI on CKD III, history of kidney/pancreas transplant 2. 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 Anasarca 4. Stage III sacral decubitus ulcer 5. Heel decubitus ulcer 6. Hyperkalemia 7. DM II, uncontrolled 8. Hypothyroidism 9. Depression  Discharge Condition: Fair  Disposition: SNF  Diet recommendation: Modified carbohydrate  Filed Weights   10/31/20 0403 11/02/20 0425  Weight: 60.9 kg 64.4 kg    History of present illness: [name] is a 59 y.o. female with medical history significant for DM, S/P renal and pancreas transplant about 10 years ago, Stage 3 CKD, HTN, s/p Rt BKA for osteomyelitis who presents to the emergency room from the subacute rehab center for evaluation of abnormal lab values.  Patient noted to have serum creatinine of 3.8 above her baseline of 1.27 from 3 weeks ago, hemoglobin of 6.5g/dl compared to her baseline of 7.7g/dl 3 weeks ago. Her stools are brown in color. She denies having any hematemesis, hematochezia or melena stools. She denies any NSAID use.  She denies having any nausea, no vomiting, no abdominal pain, no diarrhea, no urinary symptoms. She complains of feeling weak, bloated and fatigued. Labs show sodium 137, potassium 3.7, chloride 102, bicarb 23, glucose 392,  BUN 112, creatinine 3.05, calcium 8.6, white count 8.1, hemoglobin 6.7, hematocrit 22.4, MCV 116, RDW 18.6, platelet count 291 Respiratory viral panel is negative Twelve-lead EKG reviewed by me shows sinus rhythm   ED Course: Patient is a 59 year old female with a history of diabetes mellitus, status post renal and pancreatic transplant, chronic kidney disease stage III and recent right BKA for osteomyelitis who was sent to the emergency room for evaluation of abnormal labs.  Patient noted to have worsening of her renal function with serum creatinine today on admission 3.05 compared to baseline of 1.27, potassium of 5.7, hemoglobin of 6.7 compared to baseline of 7.7 and serum glucose of 392.  Patient was treated with [facility] in the emergency room and will be admitted to the hospital for further evaluation.  Hospital Course: The patient was admitted to a telemetry bed. Nephrology and wound care were consulted. The patient was continued on her cyclosporine, azathioprine, and prednisone. Her metabolic acidosis was treated with IV sodium bicarbonate. She did require transfusion with 2 units of PRBC's. The patient's fluid balance is negative by 5.5 liters since admission with anasarca.  She has been evaluated by PT/OT. They have recommended discharge to SNF.  The patient will be discharged to SNF today in fair control.  Today's assessment: S: The patient is resting comfortably. No new complaints. O: Vitals:  Vitals:   11/07/20 0540 11/07/20 1428  BP: 130/67 (!) 121/55  Pulse: 73 73  Resp: 18 18  Temp: 98 F (36.7 C) 98.3 F (36.8 C)  SpO2: 93% 96%   Exam:  Constitutional:   The patient is awake, alert, and oriented x 3. No acute distress.  Respiratory:   No increased work of breathing.  No wheezes, rales, or rhonchi  No tactile fremitus Cardiovascular:   Regular rate and rhythm  No murmurs, ectopy, or gallups.  No lateral PMI. No thrills. Abdomen:   Abdomen is soft,  non-tender, non-distended  No hernias, masses, or organomegaly  Normoactive bowel sounds.  Musculoskeletal:   No cyanosis, clubbing, or edema Skin:   No rashes, lesions, ulcers  palpation of skin: no induration or nodules Neurologic:   CN 2-12 intact  Sensation all 4 extremities intact Psychiatric:   Mental status ? Mood, affect appropriate ? Orientation to person, place, time   judgment and insight appear intact   Discharge Instructions  Discharge Instructions    Call MD for:   Complete by: As directed    Worsening edema   Call MD for:  redness, tenderness, or signs of infection (pain, swelling, redness, odor or green/yellow discharge around incision site)   Complete by: As directed    Call MD for:  severe uncontrolled pain   Complete by: As directed    Call MD for:  temperature >100.4   Complete by: As directed    Diet - low sodium heart healthy   Complete by: As directed    Diet Carb Modified   Complete by: As directed    Discharge instructions   Complete by: As directed    Discharge to SNF. Chemistry should be checked and reported to facility physician weekly. Follow up with PCP in 7-10 days after discharge from SNF.  Follow up with nephrology within 1 week of discharge. Wound care to sacral decubitus ulcer with Gerhardt's Butt Cream and to heel with Santyl ointment daily. Check glucoses daily and for any lethargy, agitation, or other change causing concern for hypoglycemia.   Discharge wound care:   Complete by: As directed    Wound care  Until discontinued      Comments: Apply Gerhardts cream to buttocks BID and with each turning and cleaning session  Apply Santyl to left heel and sacrum wounds Q day, then cover with moist fluffed gauze and foam dressing.  (Change foam dressing Q 3 days or PRN soiling.)   Increase activity slowly   Complete by: As directed      Allergies as of 11/07/2020   No Known Allergies     Medication List    STOP taking  these medications   atorvastatin 20 MG tablet Commonly known as: LIPITOR     TAKE these medications   acetaminophen 325 MG tablet Commonly known as: TYLENOL Take 2 tablets (650 mg total) by mouth every 6 (six) hours as needed for mild pain (or Fever >/= 101). What changed: reasons to take this   Admelog 100 UNIT/ML injection Generic drug: insulin lispro Inject 2 Units into the skin 3 (three) times daily before meals.   azaTHIOprine 50 MG tablet Commonly known as: IMURAN Take 50 mg by mouth daily.   collagenase ointment Commonly known as: SANTYL Apply topically daily. Start taking on: November 08, 2020   cycloSPORINE 25 MG capsule Commonly known as: SANDIMMUNE Take 75 mg by mouth 2 (two) times daily.   Gerhardt's butt cream Crea Apply 1 application topically 2 (two) times daily.   HYDROcodone-acetaminophen 5-325 MG tablet Commonly known as: NORCO/VICODIN Take 1 tablet by mouth every 4 (four) hours as needed for moderate pain.   insulin glargine 100 UNIT/ML injection Commonly known as: LANTUS Inject 0.08 mLs (8 Units total) into the skin 2 (two) times  daily. What changed:   how much to take  when to take this   levothyroxine 50 MCG tablet Commonly known as: SYNTHROID Take 50 mcg by mouth daily.   metoprolol tartrate 25 MG tablet Commonly known as: LOPRESSOR Take 12.5 mg by mouth 2 (two) times daily.   nutrition supplement (JUVEN) Pack Take 1 packet by mouth 2 (two) times daily between meals.   predniSONE 5 MG tablet Commonly known as: DELTASONE Take 5 mg by mouth daily.   rosuvastatin 5 MG tablet Commonly known as: CRESTOR Take 1 tablet (5 mg total) by mouth at bedtime.   sertraline 25 MG tablet Commonly known as: ZOLOFT Take 1 tablet (25 mg total) by mouth daily.   sodium bicarbonate 650 MG tablet Take 1 tablet (650 mg total) by mouth 2 (two) times daily.            Discharge Care Instructions  (From admission, onward)         Start      Ordered   11/07/20 0000  Discharge wound care:       Comments: Wound care  Until discontinued      Comments: Apply Gerhardts cream to buttocks BID and with each turning and cleaning session  Apply Santyl to left heel and sacrum wounds Q day, then cover with moist fluffed gauze and foam dressing.  (Change foam dressing Q 3 days or PRN soiling.)   11/07/20 1510         No Known Allergies  The results of significant diagnostics from this hospitalization (including imaging, microbiology, ancillary and laboratory) are listed below for reference.    Significant Diagnostic Studies: US Renal Transplant w/Doppler  Result Date: 10/29/2020 CLINICAL DATA:  Acute renal insufficiency, renal transplant EXAM: ULTRASOUND OF RENAL TRANSPLANT WITH RENAL DOPPLER ULTRASOUND TECHNIQUE: Ultrasound examination of the renal transplant was performed with gray-scale, color and duplex doppler evaluation. COMPARISON:  09/27/2020 FINDINGS: Transplant kidney location: RLQ Transplant Kidney: Renal measurements: 11.7 x 5.5 x 7.3 cm = volume: 242.31mL. Echotexture is increased consistent with medical renal disease. There is mild hydronephrosis. No nephrolithiasis or renal mass. Color flow in the main renal artery:  Yes Color flow in the main renal vein:  Yes Duplex Doppler Evaluation: Main Renal Artery Velocity: 38.4 cm/sec Main Renal Artery Resistive Index: 0.71 Venous waveform in main renal vein:  Present Intrarenal resistive index in upper pole:  0.76 (normal 0.6-0.8; equivocal 0.8-0.9; abnormal >= 0.9) Intrarenal resistive index in lower pole: 0.69 (normal 0.6-0.8; equivocal 0.8-0.9; abnormal >= 0.9) Bladder: Bladder is moderately distended, with debris layering dependently. Other findings:  Trace free fluid in the pelvis. IMPRESSION: 1. Increased cortical echotexture within the right lower quadrant transplant kidney, consistent with medical renal disease. 2. Mild hydronephrosis of the right lower quadrant transplant kidney. 3.  Normal Doppler interrogation of the transplant kidney. 4. Debris layering dependently within the bladder. Electronically Signed   By: Randa Ngo M.D.   On: 10/29/2020 15:46    Microbiology: Recent Results (from the past 240 hour(s))  Respiratory Panel by RT PCR (Flu A&B, Covid) - Nasopharyngeal Swab     Status: None   Collection Time: 10/29/20 12:58 PM   Specimen: Nasopharyngeal Swab  Result Value Ref Range Status   SARS Coronavirus 2 by RT PCR NEGATIVE NEGATIVE Final    Comment: (NOTE) SARS-CoV-2 target nucleic acids are NOT DETECTED.  The SARS-CoV-2 RNA is generally detectable in upper respiratoy specimens during the acute phase of infection. The lowest concentration of SARS-CoV-2  viral copies this assay can detect is 131 copies/mL. A negative result does not preclude SARS-Cov-2 infection and should not be used as the sole basis for treatment or other patient management decisions. A negative result may occur with  improper specimen collection/handling, submission of specimen other than nasopharyngeal swab, presence of viral mutation(s) within the areas targeted by this assay, and inadequate number of viral copies (<131 copies/mL). A negative result must be combined with clinical observations, patient history, and epidemiological information. The expected result is Negative.  Fact Sheet for Patients:  PinkCheek.be  Fact Sheet for Healthcare Providers:  GravelBags.it  This test is no t yet approved or cleared by the [place] FDA and  has been authorized for detection and/or diagnosis of SARS-CoV-2 by FDA under an Emergency Use Authorization (EUA). This EUA will remain  in effect (meaning this test can be used) for the duration of the COVID-19 declaration under Section 564(b)(1) of the Act, 21 U.S.C. section 360bbb-3(b)(1), unless the authorization is terminated or revoked sooner.     Influenza A by PCR NEGATIVE  NEGATIVE Final   Influenza B by PCR NEGATIVE NEGATIVE Final    Comment: (NOTE) The Xpert Xpress SARS-CoV-2/FLU/RSV assay is intended as an aid in  the diagnosis of influenza from Nasopharyngeal swab specimens and  should not be used as a sole basis for treatment. Nasal washings and  aspirates are unacceptable for Xpert Xpress SARS-CoV-2/FLU/RSV  testing.  Fact Sheet for Patients: PinkCheek.be  Fact Sheet for Healthcare Providers: GravelBags.it  This test is not yet approved or cleared by the [place] FDA and  has been authorized for detection and/or diagnosis of SARS-CoV-2 by  FDA under an Emergency Use Authorization (EUA). This EUA will remain  in effect (meaning this test can be used) for the duration of the  Covid-19 declaration under Section 564(b)(1) of the Act, 21  U.S.C. section 360bbb-3(b)(1), unless the authorization is  terminated or revoked. Performed at [facility], [place] [address] [place], [place] [address]   SARS Coronavirus 2 by RT PCR (hospital order, performed in [facility] hospital lab) Nasopharyngeal Nasopharyngeal Swab     Status: None   Collection Time: 11/07/20 12:20 PM   Specimen: Nasopharyngeal Swab  Result Value Ref Range Status   SARS Coronavirus 2 NEGATIVE NEGATIVE Final    Comment: (NOTE) SARS-CoV-2 target nucleic acids are NOT DETECTED.  The SARS-CoV-2 RNA is generally detectable in upper and lower respiratory specimens during the acute phase of infection. The lowest concentration of SARS-CoV-2 viral copies this assay can detect is 250 copies / mL. A negative result does not preclude SARS-CoV-2 infection and should not be used as the sole basis for treatment or other patient management decisions.  A negative result may occur with improper specimen collection / handling, submission of specimen other than nasopharyngeal swab, presence of viral mutation(s) within the areas targeted  by this assay, and inadequate number of viral copies (<250 copies / mL). A negative result must be combined with clinical observations, patient history, and epidemiological information.  Fact Sheet for Patients:   StrictlyIdeas.no  Fact Sheet for Healthcare Providers: BankingDealers.co.za  This test is not yet approved or  cleared by the [place] FDA and has been authorized for detection and/or diagnosis of SARS-CoV-2 by FDA under an Emergency Use Authorization (EUA).  This EUA will remain in effect (meaning this test can be used) for the duration of the COVID-19 declaration under Section 564(b)(1) of the Act, 21 U.S.C. section  360bbb-3(b)(1), unless the authorization is terminated or revoked sooner.  Performed at [facility], [place] [address] [place], [place] [address]      Labs: Basic Metabolic Panel: Recent Labs  Lab 11/01/20 0220 11/01/20 0220 11/02/20 0506 11/02/20 0506 11/03/20 0218 11/04/20 0513 11/05/20 0218 11/06/20 0043 11/07/20 0118  NA 141   < > 137   < > 136 137 138 137 137  K 4.7   < > 5.1   < > 5.2* 4.6 4.9 5.1 5.0  CL 107   < > 102   < > 103 102 104 104 105  CO2 23   < > 21*   < > 23 22 25 22 24   GLUCOSE 196*   < > 314*   < > 228* 209* 262* 225* 183*  BUN 90*   < > 96*   < > 102* 95* 89* 91* 89*  CREATININE 2.25*   < > 2.46*   < > 2.67* 2.47* 2.21* 2.27* 2.25*  CALCIUM 9.1   < > 9.2   < > 9.3 9.9 9.4 9.3 9.2  PHOS 4.8*  --  4.1  --  4.7* 4.3  --   --   --    < > = values in this interval not displayed.   Liver Function Tests: Recent Labs  Lab 11/01/20 0220 11/02/20 0506 11/03/20 0218 11/04/20 0513  ALBUMIN 2.7* 2.2* 2.4* 3.3*   No results for input(s): LIPASE, AMYLASE in the last 168 hours. No results for input(s): AMMONIA in the last 168 hours. CBC: Recent Labs  Lab 11/03/20 0218 11/04/20 0513 11/05/20 0218 11/06/20 0043 11/07/20 0118  WBC 6.7 5.4 5.3 5.9 5.1  NEUTROABS  --    --   --  4.0 3.3  HGB 9.6* 9.4* 8.7* 8.7* 8.4*  HCT 29.8* 29.4* 27.6* 27.9* 27.0*  MCV 104.6* 103.5* 105.7* 106.1* 106.3*  PLT 237 217 210 212 193   Cardiac Enzymes: No results for input(s): CKTOTAL, CKMB, CKMBINDEX, TROPONINI in the last 168 hours. BNP: BNP (last 3 results) No results for input(s): BNP in the last 8760 hours.  ProBNP (last 3 results) No results for input(s): PROBNP in the last 8760 hours.  CBG: Recent Labs  Lab 11/06/20 1157 11/06/20 1637 11/06/20 2031 11/07/20 0830 11/07/20 1205  GLUCAP 193* 177* 200* 133* 179*    Principal Problem:   Acute kidney injury superimposed on CKD ([place]) Active Problems:   Anemia secondary to renal failure   History of simultaneous kidney and pancreas transplant ([place])   Chronic kidney disease (CKD) stage G3a/A1, moderately decreased glomerular filtration rate (GFR) between 45-59 mL/min/1.73 square meter and albuminuria creatinine ratio less than 30 mg/g ([facility])   Hypertension   Diabetes mellitus with hyperglycemia ([place])   Time coordinating discharge: 38 minutes  Signed:        Saryah Loper, DO [facility]  11/07/2020, 3:12 PM

## 2020-11-08 ENCOUNTER — Encounter (HOSPITAL_COMMUNITY): Payer: Self-pay | Admitting: Emergency Medicine

## 2020-11-08 ENCOUNTER — Other Ambulatory Visit: Payer: Self-pay

## 2020-11-08 ENCOUNTER — Inpatient Hospital Stay (HOSPITAL_COMMUNITY)
Admission: EM | Admit: 2020-11-08 | Discharge: 2020-11-12 | Disposition: A | Discharge status: Skilled Nursing Facility | Payer: BC Managed Care – PPO | Source: Skilled Nursing Facility | Attending: Internal Medicine | Admitting: Internal Medicine

## 2020-11-08 DIAGNOSIS — E872 Acidosis: Secondary | ICD-10-CM | POA: Diagnosis not present

## 2020-11-08 DIAGNOSIS — L97429 Non-pressure chronic ulcer of left heel and midfoot with unspecified severity: Secondary | ICD-10-CM | POA: Diagnosis present

## 2020-11-08 DIAGNOSIS — Z79899 Other long term (current) drug therapy: Secondary | ICD-10-CM

## 2020-11-08 DIAGNOSIS — K2100 Gastro-esophageal reflux disease with esophagitis, without bleeding: Secondary | ICD-10-CM | POA: Diagnosis not present

## 2020-11-08 DIAGNOSIS — L89620 Pressure ulcer of left heel, unstageable: Secondary | ICD-10-CM | POA: Diagnosis not present

## 2020-11-08 DIAGNOSIS — E11621 Type 2 diabetes mellitus with foot ulcer: Secondary | ICD-10-CM | POA: Diagnosis present

## 2020-11-08 DIAGNOSIS — Z20822 Contact with and (suspected) exposure to covid-19: Secondary | ICD-10-CM | POA: Diagnosis present

## 2020-11-08 DIAGNOSIS — K2211 Ulcer of esophagus with bleeding: Secondary | ICD-10-CM | POA: Diagnosis present

## 2020-11-08 DIAGNOSIS — E875 Hyperkalemia: Secondary | ICD-10-CM | POA: Diagnosis not present

## 2020-11-08 DIAGNOSIS — Z794 Long term (current) use of insulin: Secondary | ICD-10-CM

## 2020-11-08 DIAGNOSIS — E108 Type 1 diabetes mellitus with unspecified complications: Secondary | ICD-10-CM | POA: Diagnosis present

## 2020-11-08 DIAGNOSIS — E1165 Type 2 diabetes mellitus with hyperglycemia: Secondary | ICD-10-CM

## 2020-11-08 DIAGNOSIS — N1330 Unspecified hydronephrosis: Secondary | ICD-10-CM | POA: Diagnosis not present

## 2020-11-08 DIAGNOSIS — Z7952 Long term (current) use of systemic steroids: Secondary | ICD-10-CM

## 2020-11-08 DIAGNOSIS — R54 Age-related physical debility: Secondary | ICD-10-CM | POA: Diagnosis present

## 2020-11-08 DIAGNOSIS — R112 Nausea with vomiting, unspecified: Secondary | ICD-10-CM | POA: Diagnosis not present

## 2020-11-08 DIAGNOSIS — R609 Edema, unspecified: Secondary | ICD-10-CM | POA: Diagnosis present

## 2020-11-08 DIAGNOSIS — R9431 Abnormal electrocardiogram [ECG] [EKG]: Secondary | ICD-10-CM | POA: Diagnosis not present

## 2020-11-08 DIAGNOSIS — Z89511 Acquired absence of right leg below knee: Secondary | ICD-10-CM

## 2020-11-08 DIAGNOSIS — X58XXXA Exposure to other specified factors, initial encounter: Secondary | ICD-10-CM | POA: Diagnosis present

## 2020-11-08 DIAGNOSIS — K449 Diaphragmatic hernia without obstruction or gangrene: Secondary | ICD-10-CM | POA: Diagnosis present

## 2020-11-08 DIAGNOSIS — K2101 Gastro-esophageal reflux disease with esophagitis, with bleeding: Secondary | ICD-10-CM

## 2020-11-08 DIAGNOSIS — Z8249 Family history of ischemic heart disease and other diseases of the circulatory system: Secondary | ICD-10-CM

## 2020-11-08 DIAGNOSIS — Z7960 Long term (current) use of unspecified immunomodulators and immunosuppressants: Secondary | ICD-10-CM

## 2020-11-08 DIAGNOSIS — N179 Acute kidney failure, unspecified: Secondary | ICD-10-CM | POA: Diagnosis present

## 2020-11-08 DIAGNOSIS — I129 Hypertensive chronic kidney disease with stage 1 through stage 4 chronic kidney disease, or unspecified chronic kidney disease: Secondary | ICD-10-CM | POA: Diagnosis present

## 2020-11-08 DIAGNOSIS — D631 Anemia in chronic kidney disease: Secondary | ICD-10-CM | POA: Diagnosis present

## 2020-11-08 DIAGNOSIS — T8619 Other complication of kidney transplant: Secondary | ICD-10-CM | POA: Diagnosis present

## 2020-11-08 DIAGNOSIS — Z79890 Hormone replacement therapy (postmenopausal): Secondary | ICD-10-CM

## 2020-11-08 DIAGNOSIS — E11649 Type 2 diabetes mellitus with hypoglycemia without coma: Secondary | ICD-10-CM | POA: Diagnosis not present

## 2020-11-08 DIAGNOSIS — Y830 Surgical operation with transplant of whole organ as the cause of abnormal reaction of the patient, or of later complication, without mention of misadventure at the time of the procedure: Secondary | ICD-10-CM | POA: Diagnosis present

## 2020-11-08 DIAGNOSIS — K922 Gastrointestinal hemorrhage, unspecified: Secondary | ICD-10-CM | POA: Diagnosis present

## 2020-11-08 DIAGNOSIS — I10 Essential (primary) hypertension: Secondary | ICD-10-CM | POA: Diagnosis present

## 2020-11-08 DIAGNOSIS — Z9483 Pancreas transplant status: Secondary | ICD-10-CM

## 2020-11-08 DIAGNOSIS — N1832 Chronic kidney disease, stage 3b: Secondary | ICD-10-CM | POA: Diagnosis present

## 2020-11-08 DIAGNOSIS — Z940 Kidney transplant status: Secondary | ICD-10-CM | POA: Diagnosis not present

## 2020-11-08 DIAGNOSIS — L8990 Pressure ulcer of unspecified site, unspecified stage: Secondary | ICD-10-CM | POA: Diagnosis present

## 2020-11-08 DIAGNOSIS — N1831 Chronic kidney disease, stage 3a: Secondary | ICD-10-CM | POA: Diagnosis present

## 2020-11-08 DIAGNOSIS — T8789 Other complications of amputation stump: Secondary | ICD-10-CM | POA: Diagnosis present

## 2020-11-08 DIAGNOSIS — K920 Hematemesis: Secondary | ICD-10-CM | POA: Diagnosis not present

## 2020-11-08 DIAGNOSIS — E039 Hypothyroidism, unspecified: Secondary | ICD-10-CM | POA: Diagnosis present

## 2020-11-08 DIAGNOSIS — E1122 Type 2 diabetes mellitus with diabetic chronic kidney disease: Secondary | ICD-10-CM | POA: Diagnosis present

## 2020-11-08 DIAGNOSIS — E1065 Type 1 diabetes mellitus with hyperglycemia: Secondary | ICD-10-CM | POA: Diagnosis present

## 2020-11-08 DIAGNOSIS — L89153 Pressure ulcer of sacral region, stage 3: Secondary | ICD-10-CM

## 2020-11-08 DIAGNOSIS — E8809 Other disorders of plasma-protein metabolism, not elsewhere classified: Secondary | ICD-10-CM | POA: Diagnosis present

## 2020-11-08 DIAGNOSIS — R601 Generalized edema: Secondary | ICD-10-CM | POA: Diagnosis not present

## 2020-11-08 DIAGNOSIS — E869 Volume depletion, unspecified: Secondary | ICD-10-CM | POA: Diagnosis present

## 2020-11-08 DIAGNOSIS — F32A Depression, unspecified: Secondary | ICD-10-CM | POA: Diagnosis present

## 2020-11-08 DIAGNOSIS — L89899 Pressure ulcer of other site, unspecified stage: Secondary | ICD-10-CM | POA: Diagnosis present

## 2020-11-08 LAB — BLOOD GAS, VENOUS
Acid-base deficit: 2 mmol/L (ref 0.0–2.0)
Bicarbonate: 21.3 mmol/L (ref 20.0–28.0)
FIO2: 21
O2 Saturation: 62.8 %
Patient temperature: 98.6
pCO2, Ven: 33 mmHg — ABNORMAL LOW (ref 44.0–60.0)
pH, Ven: 7.426 (ref 7.250–7.430)
pO2, Ven: 32.8 mmHg (ref 32.0–45.0)

## 2020-11-08 LAB — CBC WITH DIFFERENTIAL/PLATELET
Abs Immature Granulocytes: 0.07 10*3/uL (ref 0.00–0.07)
Basophils Absolute: 0 10*3/uL (ref 0.0–0.1)
Basophils Relative: 1 %
Eosinophils Absolute: 0.1 10*3/uL (ref 0.0–0.5)
Eosinophils Relative: 1 %
HCT: 32 % — ABNORMAL LOW (ref 36.0–46.0)
Hemoglobin: 9.8 g/dL — ABNORMAL LOW (ref 12.0–15.0)
Immature Granulocytes: 1 %
Lymphocytes Relative: 13 %
Lymphs Abs: 0.7 10*3/uL (ref 0.7–4.0)
MCH: 33.3 pg (ref 26.0–34.0)
MCHC: 30.6 g/dL (ref 30.0–36.0)
MCV: 108.8 fL — ABNORMAL HIGH (ref 80.0–100.0)
Monocytes Absolute: 0.5 10*3/uL (ref 0.1–1.0)
Monocytes Relative: 10 %
Neutro Abs: 4.1 10*3/uL (ref 1.7–7.7)
Neutrophils Relative %: 74 %
Platelets: 223 10*3/uL (ref 150–400)
RBC: 2.94 MIL/uL — ABNORMAL LOW (ref 3.87–5.11)
RDW: 19.3 % — ABNORMAL HIGH (ref 11.5–15.5)
WBC: 5.5 10*3/uL (ref 4.0–10.5)
nRBC: 0.4 % — ABNORMAL HIGH (ref 0.0–0.2)

## 2020-11-08 LAB — URINALYSIS, ROUTINE W REFLEX MICROSCOPIC
Bilirubin Urine: NEGATIVE
Glucose, UA: >=500 mg/dL — AB
Ketones, ur: 20 mg/dL — AB
Nitrite: NEGATIVE
Protein, ur: 30 mg/dL — AB
Specific Gravity, Urine: 1.012 (ref 1.005–1.030)
pH: 6 (ref 5.0–8.0)

## 2020-11-08 LAB — COMPREHENSIVE METABOLIC PANEL
ALT: 18 U/L (ref 0–44)
AST: 30 U/L (ref 15–41)
Albumin: 2.8 g/dL — ABNORMAL LOW (ref 3.5–5.0)
Alkaline Phosphatase: 72 U/L (ref 38–126)
Anion gap: 17 — ABNORMAL HIGH (ref 5–15)
BUN: 78 mg/dL — ABNORMAL HIGH (ref 6–20)
CO2: 19 mmol/L — ABNORMAL LOW (ref 22–32)
Calcium: 9.7 mg/dL (ref 8.9–10.3)
Chloride: 102 mmol/L (ref 98–111)
Creatinine, Ser: 1.85 mg/dL — ABNORMAL HIGH (ref 0.44–1.00)
GFR, Estimated: 31 mL/min — ABNORMAL LOW (ref >60)
Glucose, Bld: 327 mg/dL — ABNORMAL HIGH (ref 70–99)
Potassium: 4.8 mmol/L (ref 3.5–5.1)
Sodium: 138 mmol/L (ref 135–145)
Total Bilirubin: 1.8 mg/dL — ABNORMAL HIGH (ref 0.3–1.2)
Total Protein: 5.9 g/dL — ABNORMAL LOW (ref 6.5–8.1)

## 2020-11-08 LAB — RESPIRATORY PANEL BY RT PCR (FLU A&B, COVID)
Influenza A by PCR: NEGATIVE
Influenza B by PCR: NEGATIVE
SARS Coronavirus 2 by RT PCR: NEGATIVE

## 2020-11-08 LAB — TROPONIN I (HIGH SENSITIVITY)
Troponin I (High Sensitivity): 26 ng/L — ABNORMAL HIGH (ref <18)
Troponin I (High Sensitivity): 31 ng/L — ABNORMAL HIGH (ref <18)

## 2020-11-08 LAB — LIPASE, BLOOD: Lipase: 34 U/L (ref 11–51)

## 2020-11-08 LAB — CBG MONITORING, ED: Glucose-Capillary: 296 mg/dL — ABNORMAL HIGH (ref 70–99)

## 2020-11-08 MED ORDER — SODIUM CHLORIDE 0.9 % IV SOLN: INTRAVENOUS | Status: DC

## 2020-11-08 MED ORDER — INSULIN GLARGINE 100 UNIT/ML ~~LOC~~ SOLN
8.0000 [IU] | Freq: Every day | SUBCUTANEOUS | Status: DC
  Administered 2020-11-08 – 2020-11-11 (×4): 8 [IU] via SUBCUTANEOUS
  Filled 2020-11-08 (×5): qty 0.08

## 2020-11-08 MED ORDER — CYCLOSPORINE 25 MG PO CAPS
75.0000 mg | ORAL_CAPSULE | Freq: Two times a day (BID) | ORAL | Status: DC
  Administered 2020-11-08 – 2020-11-12 (×9): 75 mg via ORAL
  Filled 2020-11-08 (×9): qty 3

## 2020-11-08 MED ORDER — ONDANSETRON HCL 4 MG/2ML IJ SOLN
4.0000 mg | Freq: Once | INTRAMUSCULAR | Status: AC
  Administered 2020-11-08: 4 mg via INTRAVENOUS
  Filled 2020-11-08: qty 2

## 2020-11-08 MED ORDER — PANTOPRAZOLE SODIUM 40 MG IV SOLR
40.0000 mg | Freq: Two times a day (BID) | INTRAVENOUS | Status: DC
  Administered 2020-11-09 – 2020-11-10 (×4): 40 mg via INTRAVENOUS
  Filled 2020-11-08 (×5): qty 40

## 2020-11-08 MED ORDER — PANTOPRAZOLE SODIUM 40 MG IV SOLR
40.0000 mg | Freq: Once | INTRAVENOUS | Status: AC
  Administered 2020-11-08: 40 mg via INTRAVENOUS
  Filled 2020-11-08: qty 40

## 2020-11-08 MED ORDER — AZATHIOPRINE 50 MG PO TABS
50.0000 mg | ORAL_TABLET | Freq: Every day | ORAL | Status: DC
  Administered 2020-11-08 – 2020-11-12 (×5): 50 mg via ORAL
  Filled 2020-11-08 (×5): qty 1

## 2020-11-08 MED ORDER — LACTATED RINGERS IV BOLUS
1000.0000 mL | Freq: Once | INTRAVENOUS | Status: AC
  Administered 2020-11-08: 1000 mL via INTRAVENOUS

## 2020-11-08 NOTE — ED Notes (Signed)
Hospitalist at bedside 

## 2020-11-08 NOTE — ED Notes (Signed)
Patient noted to have one episode of coffee ground emesis saturating gown and linen.

## 2020-11-08 NOTE — H&P (Addendum)
[facility] History and Physical  Jennel Mara UDJ:[id number] DOB: 1961/03/08 DOA: 11/08/2020  Referring physician: ED  PCP: [name], MD   Patient is coming from: SNF  Chief Complaint: Vomiting blood x 1 day  HPI: [name] is a 59 y.o. 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  She stated that her vomiting got worse today to the and she had couple of frank coffee ground emesis.  She had least 6-7 episodes of vomiting since yesterday.  Patient denied any abdominal pain, no fever chills or rigor but has generalized weakness and  fatigue.  Patient denies any urinary urgency, frequency or dysuria.  Denies fever, chills or rigor.  Denies any chest pain, palpitation or shortness of breath.  Patient denies any dizziness, lightheadedness or headache.  Denies NSAIDs use.  Patient takes prednisone chronically for renal transplant.  ED Course: In the ED, patient patient had hemoglobin of 9.8, creatinine of 1.8 improved from 2.25 yesterday.  She was hemodynamically stable. received 1 L of IV fluid bolus, Protonix IV.  GI Dr. Isidore Moos [facility] was notified for GI bleed.  Plan with resuscitation, possible transfusion if needed and GI to follow in a.m. keep n.p.o. after midnight.  Review of Systems:  All systems were reviewed and were negative unless otherwise mentioned in the HPI  Past Medical History:  Diagnosis Date  . Anemia of chronic renal failure   . Diabetes mellitus without complication [facility])    Past Surgical History:  Procedure Laterality Date  . AMPUTATION Right 10/01/2020   Procedure: RIGHT BELOW KNEE AMPUTATION;  Surgeon:  Newt Minion, MD;  Location: [place];  Service: Orthopedics;  Laterality: Right;  . BLADDER SURGERY    . KIDNEY TRANSPLANT    . LEG SURGERY      Social History:  reports that she has never smoked. She has never used smokeless tobacco. She reports that she does not drink alcohol and does not use drugs.  No Known Allergies  Family History  Problem Relation Age of Onset  . Hypertension Mother   . Hypertension Father      Prior to Admission medications   Medication Sig Start Date End Date Taking? Authorizing Provider  acetaminophen (TYLENOL) 325 MG tablet Take 2 tablets (650 mg total) by mouth every 6 (six) hours as needed for mild pain (or Fever >/= 101). Patient taking differently: Take 650 mg by mouth every 6 (six) hours as needed for mild pain or fever.  10/06/20  Yes Nita Sells, MD  azaTHIOprine (IMURAN) 50 MG tablet Take 50 mg by mouth daily. 08/23/20  Yes [provider]  collagenase (SANTYL) ointment Apply topically daily. 11/08/20  Yes Swayze, Ava, DO  cycloSPORINE (SANDIMMUNE) 25 MG capsule Take 75 mg by mouth 2 (two) times daily.   Yes [provider]  HYDROcodone-acetaminophen (NORCO/VICODIN) 5-325 MG tablet Take 1 tablet by mouth every 4 (four) hours as needed for moderate pain. 11/07/20  Yes Swayze, Ava, DO  insulin glargine (LANTUS) 100 UNIT/ML injection Inject 0.08 mLs (8 Units total) into the skin 2 (two) times daily. 11/07/20  Yes Swayze, Ava, DO  insulin lispro (ADMELOG) 100 UNIT/ML injection Inject 2 Units into the skin 3 (three) times daily  before meals.   Yes [provider]  levothyroxine (SYNTHROID) 50 MCG tablet Take 50 mcg by mouth daily. 08/23/20  Yes [provider]  metoprolol tartrate (LOPRESSOR) 25 MG tablet Take 12.5 mg by mouth 2 (two) times daily.  09/15/20  Yes [provider]  nutrition supplement, JUVEN, (JUVEN) PACK Take 1 packet by mouth 2 (two) times daily between meals. 10/06/20  Yes Nita Sells, MD  Nystatin (GERHARDT'S BUTT CREAM) CREA Apply 1 application topically 2 (two) times daily. 11/07/20  Yes Swayze, Ava, DO  predniSONE (DELTASONE) 5 MG tablet Take 5 mg by mouth daily. 07/13/20  Yes [provider]  rosuvastatin (CRESTOR) 5 MG tablet Take 1 tablet (5 mg total) by mouth at bedtime. 11/07/20  Yes Swayze, Ava, DO  sertraline (ZOLOFT) 25 MG tablet Take 1 tablet (25 mg total) by mouth daily. 10/06/20  Yes Nita Sells, MD  sodium bicarbonate 650 MG tablet Take 1 tablet (650 mg total) by mouth 2 (two) times daily. 10/06/20  Yes Nita Sells, MD    Physical Exam: Vitals:   11/08/20 1845 11/08/20 1902 11/08/20 1939 11/08/20 2030  BP: (!) 190/84  (!) 175/90 (!) 166/79  Pulse: 85  87 89  Resp:  20 19 11   Temp:      TempSrc:      SpO2: 97%  95% 97%   Wt Readings from Last 3 Encounters:  11/02/20 64.4 kg  10/17/20 55.3 kg  10/06/20 55.5 kg   There is no height or weight on file to calculate BMI.  General:  Average built, not in obvious distress HENT: Normocephalic, pupils equally reacting to light and accommodation.  No scleral pallor or icterus noted. Oral mucosa is moist.  Chest:  Clear breath sounds.  Diminished breath sounds bilaterally. No crackles or wheezes.  CVS: S1 &S2 heard. No murmur.  Regular rate and rhythm. Abdomen: Soft, nontender, nondistended.  Bowel sounds are heard.  Liver is not palpable, no abdominal mass palpated Extremities: Right below-knee amputation with staples in place.  Mild erythema noted noted at the joint line.  Left heel with ulcer covered with dressing. Psych: Alert, awake and oriented, normal mood CNS:  No cranial nerve deficits.  Power equal in all extremities.   Skin: Warm and dry.  Sacral decubitus ulceration stage III present on admission, left heel ulcer present on admission, right below-knee amputation stump with staples.  Labs on Admission:   CBC: Recent Labs  Lab 11/04/20 0513 11/05/20 0218  11/06/20 0043 11/07/20 0118 11/08/20 1843  WBC 5.4 5.3 5.9 5.1 5.5  NEUTROABS  --   --  4.0 3.3 4.1  HGB 9.4* 8.7* 8.7* 8.4* 9.8*  HCT 29.4* 27.6* 27.9* 27.0* 32.0*  MCV 103.5* 105.7* 106.1* 106.3* 108.8*  PLT 217 210 212 193 408    Basic Metabolic Panel: Recent Labs  Lab 11/02/20 0506 11/02/20 0506 11/03/20 0218 11/03/20 0218 11/04/20 0513 11/05/20 0218 11/06/20 0043 11/07/20 0118 11/08/20 1843  NA 137   < > 136   < > 137 138 137 137 138  K 5.1   < > 5.2*   < > 4.6 4.9 5.1 5.0 4.8  CL 102   < > 103   < > 102 104 104 105 102  CO2 21*   < > 23   < > 22 25 22 24  19*  GLUCOSE 314*   < > 228*   < > 209* 262* 225* 183* 327*  BUN 96*   < > 102*   < >  95* 89* 91* 89* 78*  CREATININE 2.46*   < > 2.67*   < > 2.47* 2.21* 2.27* 2.25* 1.85*  CALCIUM 9.2   < > 9.3   < > 9.9 9.4 9.3 9.2 9.7  PHOS 4.1  --  4.7*  --  4.3  --   --   --   --    < > = values in this interval not displayed.    Liver Function Tests: Recent Labs  Lab 11/02/20 0506 11/03/20 0218 11/04/20 0513 11/08/20 1843  AST  --   --   --  30  ALT  --   --   --  18  ALKPHOS  --   --   --  72  BILITOT  --   --   --  1.8*  PROT  --   --   --  5.9*  ALBUMIN 2.2* 2.4* 3.3* 2.8*   Recent Labs  Lab 11/08/20 1843  LIPASE 34   No results for input(s): AMMONIA in the last 168 hours.  Cardiac Enzymes: No results for input(s): CKTOTAL, CKMB, CKMBINDEX, TROPONINI in the last 168 hours.  BNP (last 3 results) No results for input(s): BNP in the last 8760 hours.  ProBNP (last 3 results) No results for input(s): PROBNP in the last 8760 hours.  CBG: Recent Labs  Lab 11/06/20 1637 11/06/20 2031 11/07/20 0830 11/07/20 1205 11/08/20 1944  GLUCAP 177* 200* 133* 179* 296*    Lipase     Component Value Date/Time   LIPASE 34 11/08/2020 1843     Urinalysis    Component Value Date/Time   COLORURINE YELLOW 11/08/2020 1843   APPEARANCEUR HAZY (A) 11/08/2020 1843   LABSPEC 1.012 11/08/2020 1843   PHURINE 6.0  11/08/2020 1843   GLUCOSEU >=500 (A) 11/08/2020 1843   HGBUR MODERATE (A) 11/08/2020 1843   BILIRUBINUR NEGATIVE 11/08/2020 1843   KETONESUR 20 (A) 11/08/2020 1843   PROTEINUR 30 (A) 11/08/2020 1843   NITRITE NEGATIVE 11/08/2020 1843   LEUKOCYTESUR LARGE (A) 11/08/2020 1843     Drugs of Abuse  No results found for: LABOPIA, COCAINSCRNUR, [place], [place], [facility], [place] on Admission: No results found.  EKG: Was reviewed by me which shows evidence of sinus rhythm.  No changes compared to prior  Assessment/Plan Principal Problem:   GI bleed Active Problems:   History of simultaneous kidney and pancreas transplant ([place])   Chronic kidney disease (CKD) stage G3a/A1, moderately decreased glomerular filtration rate (GFR) between 45-59 mL/min/1.73 square meter and albuminuria creatinine ratio less than 30 mg/g (HCC)   Hypertension   Diabetes mellitus with hyperglycemia (HCC)   Pressure injury of skin  Upper GI bleed, coffee-ground emesis, multiple episodes of vomiting.  Patient was volume resuscitated in the ED.  Will closely monitor.  Continue with IV Protonix at this time.  GI has been notified.  Will type and screen.  Transfuse as necessary.  We will hold her Zoloft, prednisone for now.  Continue IV Zofran.  We will keep the patient n.p.o. after midnight as per GI recommendations.  We will continue with IV hydration for now.  Chronic kidney disease stage IIIa, history of kidney and pancreatic transplant.  Creatinine better than discharge creatinine yesterday.  We will continue to monitor closely.  On Imuran and cyclosporine as outpatient. Will resume  Diabetes mellitus Status post renal and pancreatic transplant.  On Lantus and lispro at home.  Resumed Lantus qhs. Take Lantus twice a day normally.Marland Kitchen  Changed to sensitive sliding scale insulin scale every 6 hours.  Will closely monitor.  Will need to change  insulin regimen after n.p.o. is over.  Hypothyroidism.   Patient is on Synthroid at home.  We will continue while in the hospital.  Depression.  On Zoloft at home.  Will hold for now due to GI bleed.  Sacral stage III decubitus ulceration, left heel decubitus ulceration present on admission.  Continue wound care.  Status post right below-knee amputation on 10/01/2020.  Patient still has staples in place.  Will need to follow-up with orthopedics for staples removal.  Debility, deconditioning.  Patient is from skilled nursing facility we will consult transition of care on discharge planning.  Continue PT evaluation after acute issues.  DVT Prophylaxis: Sequential compression device  Consultant: Dr. Tarri Glenn GI  Code Status: Full code  Microbiology none  Antibiotics: None  Family Communication:  Patients' condition and plan of care including tests being ordered have who indicate understanding and agree with the plan.  Patient did not wish me to call her husband at this time due to late hours .   Status is: Inpatient  Remains inpatient appropriate because:IV treatments appropriate due to intensity of illness or inability to take PO, Inpatient level of care appropriate due to severity of illness and GI bleed possible need for endoscopic evaluation   Dispo: The patient is from: SNF              Anticipated d/c is to: SNF              Anticipated d/c date is: 2 days              Patient currently is not medically stable to d/c.    Severity of Illness: The appropriate patient status for this patient is INPATIENT. Inpatient status is judged to be reasonable and necessary in order to provide the required intensity of service to ensure the patient's safety. The patient's presenting symptoms, physical exam findings, and initial radiographic and laboratory data in the context of their chronic comorbidities is felt to place them at high risk for further clinical deterioration. Furthermore, it is not anticipated that the patient will be medically stable  for discharge from the hospital within 2 midnights of admission.  I certify that at the point of admission it is my clinical judgment that the patient will require inpatient hospital care spanning beyond 2 midnights from the point of admission due to high intensity of service, high risk for further deterioration and high frequency of surveillance required.   Signed, Flora Lipps, MD [facility] 11/08/2020

## 2020-11-08 NOTE — ED Triage Notes (Signed)
Per EMS, patient from [facility]. C/o N/V and abdominal pain since last night. Patient thinks it is from the hotdog and chili she had for dinner last night.  CBG 397 BP 186/80

## 2020-11-08 NOTE — ED Provider Notes (Signed)
[facility] DEPT Provider Note   CSN: [id number] Arrival date & time: 11/08/20  1810     History Chief Complaint  Patient presents with  . Abdominal Pain  . Emesis    [name] is a 59 y.o. female. DM, S/P renal and pancreas transplant about 10 years ago, Stage 3 CKD, HTN, s/p Rt BKA presents to ER with concern for severe nausea.  Patient states that she started having nausea yesterday, significantly worse today.  Has noted increased vomiting, has noted black specks in her vomit.  Denies any abdominal pain, no diarrhea or constipation.  No blood in stool.  HPI     Past Medical History:  Diagnosis Date  . Anemia of chronic renal failure   . Diabetes mellitus without complication [facility]     Patient Active Problem List   Diagnosis Date Noted  . GI bleed 11/08/2020  . AKI (acute kidney injury) ([place]) 10/29/2020  . Gangrene of right foot ([place])   . Subacute osteomyelitis of right foot ([place])   . Diabetes mellitus with hyperglycemia ([place]) 09/27/2020  . Pressure injury of skin 09/27/2020  . Diabetic ketoacidosis ([place]) 09/26/2020  . Acute kidney injury superimposed on CKD ([place]) 09/26/2020  . Cellulitis and abscess of right leg 09/26/2020  . 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 History of simultaneous kidney and pancreas transplant ([place]) 05/14/2020  . Chronic kidney disease (CKD) stage G3a/A1, moderately decreased glomerular filtration rate (GFR) between 45-59 mL/min/1.73 square meter and albuminuria creatinine ratio less than 30 mg/g (HCC) 05/14/2020  . Hypertension 05/14/2020    Past Surgical History:  Procedure Laterality Date  . AMPUTATION Right 10/01/2020   Procedure: RIGHT BELOW KNEE AMPUTATION;  Surgeon: Newt Minion, MD;  Location: [place];  Service: Orthopedics;  Laterality: Right;  . BLADDER SURGERY    . KIDNEY TRANSPLANT    . LEG SURGERY       OB History   No obstetric history on file.     Family History  Problem  Relation Age of Onset  . Hypertension Mother   . Hypertension Father     Social History   Tobacco Use  . Smoking status: Never Smoker  . Smokeless tobacco: Never Used  Vaping Use  . Vaping Use: Never used  Substance Use Topics  . Alcohol use: Never  . Drug use: Never    Home Medications Prior to Admission medications   Medication Sig Start Date End Date Taking? Authorizing Provider  acetaminophen (TYLENOL) 325 MG tablet Take 2 tablets (650 mg total) by mouth every 6 (six) hours as needed for mild pain (or Fever >/= 101). Patient taking differently: Take 650 mg by mouth every 6 (six) hours as needed for mild pain or fever.  10/06/20  Yes Nita Sells, MD  azaTHIOprine (IMURAN) 50 MG tablet Take 50 mg by mouth daily. 08/23/20  Yes [provider]  collagenase (SANTYL) ointment Apply topically daily. 11/08/20  Yes Swayze, Ava, DO  cycloSPORINE (SANDIMMUNE) 25 MG capsule Take 75 mg by mouth 2 (two) times daily.   Yes [provider]  HYDROcodone-acetaminophen (NORCO/VICODIN) 5-325 MG tablet Take 1 tablet by mouth every 4 (four) hours as needed for moderate pain. 11/07/20  Yes Swayze, Ava, DO  insulin glargine (LANTUS) 100 UNIT/ML injection Inject 0.08 mLs (8 Units total) into the skin 2 (two) times daily. 11/07/20  Yes Swayze, Ava, DO  insulin lispro (ADMELOG) 100 UNIT/ML injection Inject 2 Units into the skin 3 (three) times  daily before meals.   Yes [provider]  levothyroxine (SYNTHROID) 50 MCG tablet Take 50 mcg by mouth daily. 08/23/20  Yes [provider]  metoprolol tartrate (LOPRESSOR) 25 MG tablet Take 12.5 mg by mouth 2 (two) times daily.  09/15/20  Yes [provider]  nutrition supplement, JUVEN, (JUVEN) PACK Take 1 packet by mouth 2 (two) times daily between meals. 10/06/20  Yes Nita Sells, MD  Nystatin (GERHARDT'S BUTT CREAM) CREA Apply 1 application topically 2 (two) times daily. 11/07/20  Yes Swayze, Ava, DO    predniSONE (DELTASONE) 5 MG tablet Take 5 mg by mouth daily. 07/13/20  Yes [provider]  rosuvastatin (CRESTOR) 5 MG tablet Take 1 tablet (5 mg total) by mouth at bedtime. 11/07/20  Yes Swayze, Ava, DO  sertraline (ZOLOFT) 25 MG tablet Take 1 tablet (25 mg total) by mouth daily. 10/06/20  Yes Nita Sells, MD  sodium bicarbonate 650 MG tablet Take 1 tablet (650 mg total) by mouth 2 (two) times daily. 10/06/20  Yes Nita Sells, MD    Allergies    Patient has no known allergies.  Review of Systems   Review of Systems  Constitutional: Negative for chills and fever.  HENT: Negative for ear pain and sore throat.   Eyes: Negative for pain and visual disturbance.  Respiratory: Negative for cough and shortness of breath.   Cardiovascular: Negative for chest pain and palpitations.  Gastrointestinal: Positive for nausea and vomiting. Negative for abdominal pain.  Genitourinary: Negative for dysuria and hematuria.  Musculoskeletal: Negative for arthralgias and back pain.  Skin: Negative for color change and rash.  Neurological: Negative for seizures and syncope.  All other systems reviewed and are negative.   Physical Exam Updated Vital Signs BP (!) 174/91   Pulse 89   Temp 97.8 F (36.6 C) (Oral)   Resp 17   SpO2 97%   Physical Exam Vitals and nursing note reviewed.  Constitutional:      General: She is not in acute distress.    Appearance: She is well-developed.     Comments: Chronically ill-appearing in no acute distress  HENT:     Head: Normocephalic and atraumatic.  Eyes:     Conjunctiva/sclera: Conjunctivae normal.  Cardiovascular:     Rate and Rhythm: Normal rate and regular rhythm.     Heart sounds: No murmur heard.   Pulmonary:     Effort: Pulmonary effort is normal. No respiratory distress.     Breath sounds: Normal breath sounds.  Abdominal:     Palpations: Abdomen is soft.     Tenderness: There is no abdominal tenderness.   Musculoskeletal:     Cervical back: Neck supple.     Comments: Right BKA  Skin:    General: Skin is warm and dry.  Neurological:     Mental Status: She is alert.     ED Results / Procedures / Treatments   Labs (all labs ordered are listed, but only abnormal results are displayed) Labs Reviewed  CBC WITH DIFFERENTIAL/PLATELET - Abnormal; Notable for the following components:      Result Value   RBC 2.94 (*)    Hemoglobin 9.8 (*)    HCT 32.0 (*)    MCV 108.8 (*)    RDW 19.3 (*)    nRBC 0.4 (*)    All other components within normal limits  COMPREHENSIVE METABOLIC PANEL - Abnormal; Notable for the following components:   CO2 19 (*)    Glucose, Bld 327 (*)  BUN 78 (*)    Creatinine, Ser 1.85 (*)    Total Protein 5.9 (*)    Albumin 2.8 (*)    Total Bilirubin 1.8 (*)    GFR, Estimated 31 (*)    Anion gap 17 (*)    All other components within normal limits  URINALYSIS, ROUTINE W REFLEX MICROSCOPIC - Abnormal; Notable for the following components:   APPearance HAZY (*)    Glucose, UA >=500 (*)    Hgb urine dipstick MODERATE (*)    Ketones, ur 20 (*)    Protein, ur 30 (*)    Leukocytes,Ua LARGE (*)    Bacteria, UA RARE (*)    All other components within normal limits  BLOOD GAS, VENOUS - Abnormal; Notable for the following components:   pCO2, Ven 33.0 (*)    All other components within normal limits  CBG MONITORING, ED - Abnormal; Notable for the following components:   Glucose-Capillary 296 (*)    All other components within normal limits  TROPONIN I (HIGH SENSITIVITY) - Abnormal; Notable for the following components:   Troponin I (High Sensitivity) 26 (*)    All other components within normal limits  TROPONIN I (HIGH SENSITIVITY) - Abnormal; Notable for the following components:   Troponin I (High Sensitivity) 31 (*)    All other components within normal limits  RESPIRATORY PANEL BY RT PCR (FLU A&B, COVID)  LIPASE, BLOOD  GASTRIC OCCULT BLOOD (1-CARD TO LAB)  POC  OCCULT BLOOD, ED  TYPE AND SCREEN    EKG EKG Interpretation  Date/Time:  Saturday November 08 2020 20:35:09 EST Ventricular Rate:  89 PR Interval:    QRS Duration: 82 QT Interval:  379 QTC Calculation: 462 R Axis:   -84 Text Interpretation: Sinus rhythm Extensive anterior infarct, old Confirmed by Madalyn Rob [phone]) on 11/08/2020 8:45:46 PM   Radiology No results found.  Procedures .Critical Care Performed by: Lucrezia Starch, MD Authorized by: Lucrezia Starch, MD   Critical care provider statement:    Critical care time (minutes):  34   Critical care was necessary to treat or prevent imminent or life-threatening deterioration of the following conditions: Upper GI bleed.   Critical care was time spent personally by me on the following activities:  Discussions with consultants, evaluation of patient's response to treatment, examination of patient, ordering and performing treatments and interventions, ordering and review of laboratory studies, ordering and review of radiographic studies, pulse oximetry, re-evaluation of patient's condition, obtaining history from patient or surrogate and review of old charts   (including critical care time)  Medications Ordered in ED Medications  insulin glargine (LANTUS) injection 8 Units (has no administration in time range)  azaTHIOprine (IMURAN) tablet 50 mg (has no administration in time range)  cycloSPORINE (SANDIMMUNE) capsule 75 mg (has no administration in time range)  0.9 %  sodium chloride infusion (has no administration in time range)  pantoprazole (PROTONIX) injection 40 mg (has no administration in time range)  ondansetron (ZOFRAN) injection 4 mg (4 mg Intravenous Given 11/08/20 1945)  lactated ringers bolus 1,000 mL (0 mLs Intravenous Stopped 11/08/20 2138)  pantoprazole (PROTONIX) injection 40 mg (40 mg Intravenous Given 11/08/20 2002)    ED Course  I have reviewed the triage vital signs and the nursing  notes.  Pertinent labs & imaging results that were available during my care of the patient were reviewed by me and considered in my medical decision making (see chart for details).    MDM Rules/Calculators/A&P  59 year old lady DM, S/P renal and pancreas transplant about 10 years ago, Stage 3 CKD, HTN, s/p Rt BKA recently admitted for anemia, AKI.  Came in today with nausea and vomiting.  Initially on exam patient appears uncomfortable, very nauseous.  Had coffee-ground emesis in ER.  Concern for possibility of upper GI bleed.  Provided fluids, antiemetics and Protonix.  Thankfully her hemoglobin actually improved from last check.  No AKI.  Not in DKA.  Reviewed with Dr. Tarri Glenn on-call for gastroenterology.  Someone from their team will evaluate in the morning, no additional recommendations at this time and agreed with IV PPI.  On reassessment patient symptoms improved.  Vital signs stable, consult the hospitalist for admission.  Final Clinical Impression(s) / ED Diagnoses Final diagnoses:  Coffee ground emesis  Nausea and vomiting, intractability of vomiting not specified, unspecified vomiting type    Rx / DC Orders ED Discharge Orders    None       Lucrezia Starch, MD 11/08/20 2231

## 2020-11-09 ENCOUNTER — Other Ambulatory Visit: Payer: Self-pay

## 2020-11-09 DIAGNOSIS — Z9483 Pancreas transplant status: Secondary | ICD-10-CM

## 2020-11-09 DIAGNOSIS — Z7960 Long term (current) use of unspecified immunomodulators and immunosuppressants: Secondary | ICD-10-CM

## 2020-11-09 DIAGNOSIS — Z79899 Other long term (current) drug therapy: Secondary | ICD-10-CM

## 2020-11-09 DIAGNOSIS — K920 Hematemesis: Secondary | ICD-10-CM

## 2020-11-09 DIAGNOSIS — Z940 Kidney transplant status: Secondary | ICD-10-CM | POA: Diagnosis not present

## 2020-11-09 LAB — TYPE AND SCREEN
ABO/RH(D): O POS
Antibody Screen: NEGATIVE

## 2020-11-09 LAB — CBC
HCT: 29 % — ABNORMAL LOW (ref 36.0–46.0)
Hemoglobin: 9.2 g/dL — ABNORMAL LOW (ref 12.0–15.0)
MCH: 34.1 pg — ABNORMAL HIGH (ref 26.0–34.0)
MCHC: 31.7 g/dL (ref 30.0–36.0)
MCV: 107.4 fL — ABNORMAL HIGH (ref 80.0–100.0)
Platelets: 230 10*3/uL (ref 150–400)
RBC: 2.7 MIL/uL — ABNORMAL LOW (ref 3.87–5.11)
RDW: 18.9 % — ABNORMAL HIGH (ref 11.5–15.5)
WBC: 5.4 10*3/uL (ref 4.0–10.5)
nRBC: 0 % (ref 0.0–0.2)

## 2020-11-09 LAB — CBG MONITORING, ED
Glucose-Capillary: 234 mg/dL — ABNORMAL HIGH (ref 70–99)
Glucose-Capillary: 138 mg/dL — ABNORMAL HIGH (ref 70–99)

## 2020-11-09 LAB — MRSA PCR SCREENING: MRSA by PCR: NEGATIVE

## 2020-11-09 LAB — GLUCOSE, CAPILLARY
Glucose-Capillary: 151 mg/dL — ABNORMAL HIGH (ref 70–99)
Glucose-Capillary: 190 mg/dL — ABNORMAL HIGH (ref 70–99)
Glucose-Capillary: 152 mg/dL — ABNORMAL HIGH (ref 70–99)

## 2020-11-09 MED ORDER — HYDRALAZINE HCL 20 MG/ML IJ SOLN
10.0000 mg | Freq: Four times a day (QID) | INTRAMUSCULAR | Status: DC | PRN
  Administered 2020-11-10 – 2020-11-12 (×2): 10 mg via INTRAVENOUS
  Filled 2020-11-09 (×2): qty 1

## 2020-11-09 MED ORDER — METOPROLOL TARTRATE 25 MG PO TABS
12.5000 mg | ORAL_TABLET | Freq: Two times a day (BID) | ORAL | Status: DC
  Administered 2020-11-09 – 2020-11-12 (×9): 12.5 mg via ORAL
  Filled 2020-11-09 (×9): qty 1

## 2020-11-09 MED ORDER — SODIUM CHLORIDE 0.9 % IV SOLN: INTRAVENOUS | Status: DC

## 2020-11-09 MED ORDER — ACETAMINOPHEN 325 MG PO TABS: 650.0000 mg | ORAL_TABLET | Freq: Four times a day (QID) | ORAL | Status: DC | PRN

## 2020-11-09 MED ORDER — ONDANSETRON HCL 4 MG PO TABS: 4.0000 mg | ORAL_TABLET | Freq: Four times a day (QID) | ORAL | Status: DC | PRN

## 2020-11-09 MED ORDER — INSULIN ASPART 100 UNIT/ML ~~LOC~~ SOLN
0.0000 [IU] | Freq: Four times a day (QID) | SUBCUTANEOUS | Status: DC
  Administered 2020-11-09: 2 [IU] via SUBCUTANEOUS
  Administered 2020-11-09 (×2): 1 [IU] via SUBCUTANEOUS
  Administered 2020-11-10: 2 [IU] via SUBCUTANEOUS
  Administered 2020-11-10 – 2020-11-11 (×2): 1 [IU] via SUBCUTANEOUS
  Administered 2020-11-11: 3 [IU] via SUBCUTANEOUS
  Administered 2020-11-11 – 2020-11-12 (×2): 2 [IU] via SUBCUTANEOUS
  Administered 2020-11-12: 3 [IU] via SUBCUTANEOUS
  Administered 2020-11-12: 1 [IU] via SUBCUTANEOUS
  Filled 2020-11-09: qty 0.06

## 2020-11-09 MED ORDER — SODIUM BICARBONATE 650 MG PO TABS
650.0000 mg | ORAL_TABLET | Freq: Two times a day (BID) | ORAL | Status: DC
  Administered 2020-11-09 – 2020-11-12 (×9): 650 mg via ORAL
  Filled 2020-11-09 (×9): qty 1

## 2020-11-09 MED ORDER — PREDNISONE 5 MG PO TABS
5.0000 mg | ORAL_TABLET | Freq: Every day | ORAL | Status: DC
  Administered 2020-11-10 – 2020-11-12 (×3): 5 mg via ORAL
  Filled 2020-11-09 (×3): qty 1

## 2020-11-09 MED ORDER — SODIUM CHLORIDE 0.9 % IV SOLN: INTRAVENOUS | Status: AC

## 2020-11-09 MED ORDER — LEVOTHYROXINE SODIUM 50 MCG PO TABS
50.0000 ug | ORAL_TABLET | Freq: Every day | ORAL | Status: DC
  Administered 2020-11-09 – 2020-11-12 (×3): 50 ug via ORAL
  Filled 2020-11-09 (×3): qty 1

## 2020-11-09 MED ORDER — LEVOTHYROXINE SODIUM 50 MCG PO TABS: 50.0000 ug | ORAL_TABLET | Freq: Every day | ORAL | Status: DC

## 2020-11-09 MED ORDER — ONDANSETRON HCL 4 MG/2ML IJ SOLN
4.0000 mg | Freq: Four times a day (QID) | INTRAMUSCULAR | Status: DC | PRN
  Administered 2020-11-09 – 2020-11-10 (×3): 4 mg via INTRAVENOUS
  Filled 2020-11-09 (×3): qty 2

## 2020-11-09 MED ORDER — HYDROCODONE-ACETAMINOPHEN 5-325 MG PO TABS
1.0000 | ORAL_TABLET | ORAL | Status: DC | PRN
  Administered 2020-11-09 – 2020-11-12 (×13): 1 via ORAL
  Filled 2020-11-09 (×13): qty 1

## 2020-11-09 NOTE — Progress Notes (Signed)
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 Apparently went to a skilled nursing facility on 11/12, patient reports eating hot chili and starting symptoms. Does not have history of GI ulcers. She is on chronic prednisone therapy. Does not use any NSAIDs. In the ER hemoglobin 9.8 which is fairly stable. Creatinine 1.8 which is at baseline. Started on Protonix, suspected upper GI bleeding or Mallory-Weiss tear and admitted to the hospital.   Assessment & Plan:   Principal Problem:   GI bleed Active Problems:   History of simultaneous kidney and pancreas transplant ([place])   Chronic kidney disease (CKD) stage G3a/A1, moderately decreased glomerular filtration rate (GFR) between 45-59 mL/min/1.73 square meter and albuminuria creatinine ratio less than 30 mg/g (HCC)   Hypertension   Diabetes mellitus with hyperglycemia (HCC)   Pressure injury of skin  Upper GI bleeding/coffee-ground emesis/intractable nausea and vomiting: Suspect Mallory-Weiss tear or peptic ulcer disease. Patient is currently stabilized. No drop in hemoglobin. Remains on Protonix 40 mg IV twice daily. Will benefit with upper GI endoscopic evaluation, planned for tomorrow. Followed by GI.  CKD stage IIIa: Known kidney and pancreas transplant. Creatinine at about baseline. We will continue to monitor to stabilize. Patient is on chronic suppression therapy with low-dose prednisone, cyclosporine and azathioprine that we  will continue as her renal functions are normal. No evidence of active ongoing bleeding, should continue prednisone.  Type 2 diabetes on insulin: Well controlled. Resume insulin.  Hypothyroidism: On Synthroid.  Sacral decubitus ulcer stage III, multiple left heel ulcerations: Multiple pressure ulcers present. Wound care.  Right BKA on 10/6: Stump healing well. Patient is still has surgical staples present for more than 5 weeks. I will talk to her surgeon tomorrow. Probably needs to be removed.  Advanced physical debility: Patient is with debilitated and frail. May need to go back to skilled nursing facility.   DVT prophylaxis: SCDs Start: 11/09/20 0145   Code Status: Full code Family Communication: Husband at the bedside Disposition Plan: Status is: Inpatient  Remains inpatient appropriate because:Inpatient level of care appropriate due to severity of illness   Dispo: The patient is from: SNF              Anticipated d/c is to: SNF              Anticipated d/c date is: 3 days              Patient currently is not medically stable to d/c.         Consultants:   Gastroenterology  Procedures:   None  Antimicrobials:   None   Subjective: Patient seen and examined. She feels somehow queasy in her stomach but denies any pain, nausea or vomiting. She had 2 episodes of coffee-ground emesis after coming to ER and none since then. She has not tried to eat any food. Husband at the bedside.  Objective: Vitals:   11/09/20 0830 11/09/20 1000 11/09/20 1100 11/09/20 1101  BP: (!) 158/74 (!) 172/85  (!) 175/82  Pulse: 71 77  72  Resp: (!)  21 18  18   Temp:    97.8 F (36.6 C)  TempSrc:    Oral  SpO2: 96% 98%  95%  Weight:   55.8 kg   Height:   5\' 3"  (1.6 m)     Intake/Output Summary (Last 24 hours) at 11/09/2020 1121 Last data filed at 11/09/2020 0820 Gross per 24 hour  Intake 904.51 ml  Output --  Net 904.51 ml   Filed Weights   11/09/20 1100  Weight: 55.8 kg     Examination:  General exam: Appears calm and comfortable  Chronically sick looking. Currently on room air. Respiratory system: Clear to auscultation. Respiratory effort normal. Some conducted airway sounds. Cardiovascular system: S1 & S2 heard, RRR.  Gastrointestinal system: Soft and nontender. Bowel sounds present.  Central nervous system: Alert and oriented. No focal neurological deficits. Generalized weakness. Right BKA stump with staples intact, flaky margins. Left heel and foot with multiple pressure ulcers.    Data Reviewed: I have personally reviewed following labs and imaging studies  CBC: Recent Labs  Lab 11/05/20 0218 11/06/20 0043 11/07/20 0118 11/08/20 1843 11/09/20 0450  WBC 5.3 5.9 5.1 5.5 5.4  NEUTROABS  --  4.0 3.3 4.1  --   HGB 8.7* 8.7* 8.4* 9.8* 9.2*  HCT 27.6* 27.9* 27.0* 32.0* 29.0*  MCV 105.7* 106.1* 106.3* 108.8* 107.4*  PLT 210 212 193 223 433   Basic Metabolic Panel: Recent Labs  Lab 11/03/20 0218 11/03/20 0218 11/04/20 0513 11/05/20 0218 11/06/20 0043 11/07/20 0118 11/08/20 1843  NA 136   < > 137 138 137 137 138  K 5.2*   < > 4.6 4.9 5.1 5.0 4.8  CL 103   < > 102 104 104 105 102  CO2 23   < > 22 25 22 24  19*  GLUCOSE 228*   < > 209* 262* 225* 183* 327*  BUN 102*   < > 95* 89* 91* 89* 78*  CREATININE 2.67*   < > 2.47* 2.21* 2.27* 2.25* 1.85*  CALCIUM 9.3   < > 9.9 9.4 9.3 9.2 9.7  PHOS 4.7*  --  4.3  --   --   --   --    < > = values in this interval not displayed.   GFR: Estimated Creatinine Clearance: 26.8 mL/min (A) (by C-G formula based on SCr of 1.85 mg/dL (H)). Liver Function Tests: Recent Labs  Lab 11/03/20 0218 11/04/20 0513 11/08/20 1843  AST  --   --  30  ALT  --   --  18  ALKPHOS  --   --  72  BILITOT  --   --  1.8*  PROT  --   --  5.9*  ALBUMIN 2.4* 3.3* 2.8*   Recent Labs  Lab 11/08/20 1843  LIPASE 34   No results for input(s): AMMONIA in the last 168 hours. Coagulation Profile: No results for  input(s): INR, PROTIME in the last 168 hours. Cardiac Enzymes: No results for input(s): CKTOTAL, CKMB, CKMBINDEX, TROPONINI in the last 168 hours. BNP (last 3 results) No results for input(s): PROBNP in the last 8760 hours. HbA1C: No results for input(s): HGBA1C in the last 72 hours. CBG: Recent Labs  Lab 11/07/20 0830 11/07/20 1205 11/08/20 1944 11/09/20 0207 11/09/20 0737  GLUCAP 133* 179* 296* 234* 138*   Lipid Profile: No results for input(s): CHOL, HDL, LDLCALC, TRIG, CHOLHDL, LDLDIRECT in the last 72 hours. Thyroid Function Tests: No results for input(s): TSH, T4TOTAL, FREET4, T3FREE, THYROIDAB in  the last 72 hours. Anemia Panel: No results for input(s): VITAMINB12, FOLATE, FERRITIN, TIBC, IRON, RETICCTPCT in the last 72 hours. Sepsis Labs: No results for input(s): PROCALCITON, LATICACIDVEN in the last 168 hours.  Recent Results (from the past 240 hour(s))  SARS Coronavirus 2 by RT PCR (hospital order, performed in [facility] hospital lab) Nasopharyngeal Nasopharyngeal Swab     Status: None   Collection Time: 11/07/20 12:20 PM   Specimen: Nasopharyngeal Swab  Result Value Ref Range Status   SARS Coronavirus 2 NEGATIVE NEGATIVE Final    Comment: (NOTE) SARS-CoV-2 target nucleic acids are NOT DETECTED.  The SARS-CoV-2 RNA is generally detectable in upper and lower respiratory specimens during the acute phase of infection. The lowest concentration of SARS-CoV-2 viral copies this assay can detect is 250 copies / mL. A negative result does not preclude SARS-CoV-2 infection and should not be used as the sole basis for treatment or other patient management decisions.  A negative result may occur with improper specimen collection / handling, submission of specimen other than nasopharyngeal swab, presence of viral mutation(s) within the areas targeted by this assay, and inadequate number of viral copies (<250 copies / mL). A negative result must be combined with  clinical observations, patient history, and epidemiological information.  Fact Sheet for Patients:   StrictlyIdeas.no  Fact Sheet for Healthcare Providers: BankingDealers.co.za  This test is not yet approved or  cleared by the [place] FDA and has been authorized for detection and/or diagnosis of SARS-CoV-2 by FDA under an Emergency Use Authorization (EUA).  This EUA will remain in effect (meaning this test can be used) for the duration of the COVID-19 declaration under Section 564(b)(1) of the Act, 21 U.S.C. section 360bbb-3(b)(1), unless the authorization is terminated or revoked sooner.  Performed at [facility], [place] [address], [place], [place] [address]   Respiratory Panel by RT PCR (Flu A&B, Covid) - Nasopharyngeal Swab     Status: None   Collection Time: 11/08/20  7:14 PM   Specimen: Nasopharyngeal Swab  Result Value Ref Range Status   SARS Coronavirus 2 by RT PCR NEGATIVE NEGATIVE Final    Comment: (NOTE) SARS-CoV-2 target nucleic acids are NOT DETECTED.  The SARS-CoV-2 RNA is generally detectable in upper respiratoy specimens during the acute phase of infection. The lowest concentration of SARS-CoV-2 viral copies this assay can detect is 131 copies/mL. A negative result does not preclude SARS-Cov-2 infection and should not be used as the sole basis for treatment or other patient management decisions. A negative result may occur with  improper specimen collection/handling, submission of specimen other than nasopharyngeal swab, presence of viral mutation(s) within the areas targeted by this assay, and inadequate number of viral copies (<131 copies/mL). A negative result must be combined with clinical observations, patient history, and epidemiological information. The expected result is Negative.  Fact Sheet for Patients:  PinkCheek.be  Fact Sheet for Healthcare Providers:   GravelBags.it  This test is no t yet approved or cleared by the [place] FDA and  has been authorized for detection and/or diagnosis of SARS-CoV-2 by FDA under an Emergency Use Authorization (EUA). This EUA will remain  in effect (meaning this test can be used) for the duration of the COVID-19 declaration under Section 564(b)(1) of the Act, 21 U.S.C. section 360bbb-3(b)(1), unless the authorization is terminated or revoked sooner.     Influenza A by PCR NEGATIVE NEGATIVE Final   Influenza B by PCR NEGATIVE NEGATIVE Final  Comment: (NOTE) The Xpert Xpress SARS-CoV-2/FLU/RSV assay is intended as an aid in  the diagnosis of influenza from Nasopharyngeal swab specimens and  should not be used as a sole basis for treatment. Nasal washings and  aspirates are unacceptable for Xpert Xpress SARS-CoV-2/FLU/RSV  testing.  Fact Sheet for Patients: PinkCheek.be  Fact Sheet for Healthcare Providers: GravelBags.it  This test is not yet approved or cleared by the [place] FDA and  has been authorized for detection and/or diagnosis of SARS-CoV-2 by  FDA under an Emergency Use Authorization (EUA). This EUA will remain  in effect (meaning this test can be used) for the duration of the  Covid-19 declaration under Section 564(b)(1) of the Act, 21  U.S.C. section 360bbb-3(b)(1), unless the authorization is  terminated or revoked. Performed at [facility], [place] [address] [place], [place] [address]          Radiology Studies: No results found.      Scheduled Meds: . azaTHIOprine  50 mg Oral Daily  . cycloSPORINE  75 mg Oral BID  . insulin aspart  0-6 Units Subcutaneous Q6H  . insulin glargine  8 Units Subcutaneous QHS  . levothyroxine  50 mcg Oral Q0600  . metoprolol tartrate  12.5 mg Oral BID  . pantoprazole (PROTONIX) IV  40 mg Intravenous Q12H  . sodium  bicarbonate  650 mg Oral BID   Continuous Infusions: . sodium chloride 100 mL/hr at 11/09/20 1111     LOS: 1 day    Time spent: 30 minutes    Barb Merino, MD [facility] Pager [phone]

## 2020-11-09 NOTE — H&P (View-Only) (Signed)
Consultation  Referring Provider:   Dr. Sloan Leiter Primary Care Physician:  [name], MD Primary Gastroenterologist:   Althia Forts      Reason for Consultation: Nausea/vomiting/coffee-ground emesis            HPI:   [name] is a 59 y.o. 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    Today, the patient is found laying in bed by her husband, she explains that she got to a nursing facility on Friday and they gave her a chili dog which she ate and 3 hours later at around 9:00 she became severely nauseous and then started vomiting and early hours of the morning, she had at least 6-7 episodes of vomiting yesterday and when she presented to the ER she proceeded to have 2 further episodes which had some "coffee grounds" and then per staff here.  Tells me she has not had any vomiting since then but remains quite nauseous.  Tells me her stomach is just "uneasy", not necessarily uncomfortable.  Denies previous history of heartburn, reflux or upper GI symptoms.  Apparently has had a colonoscopy about 5 years ago when he lived in [place].  This was normal per her.  No history of similar symptoms.  She thinks it was just "food poisoning".      Patient is on Prednisone chronically for renal transplant.    Denies fever, chills, weight loss or change in bowel habits.  ED course: Hemoglobin 9.8 (8.4 on 11/07/20), creatinine 1.8, given 1 L of IV fluid bolus and Protonix IV  GI history: In [place], we do not have records, last colonoscopy 5 years ago per patient, no prior EGD  Past Medical History:  Diagnosis Date  . Anemia of chronic renal failure   . Diabetes  mellitus without complication [facility])     Past Surgical History:  Procedure Laterality Date  . AMPUTATION Right 10/01/2020   Procedure: RIGHT BELOW KNEE AMPUTATION;  Surgeon: Newt Minion, MD;  Location: [facility];  Service: Orthopedics;  Laterality: Right;  . BLADDER SURGERY    . KIDNEY TRANSPLANT    . LEG SURGERY      Family History  Problem Relation Age of Onset  . Hypertension Mother   . Hypertension Father     Social History   Tobacco Use  . Smoking status: Never Smoker  . Smokeless tobacco: Never Used  Vaping Use  . Vaping Use: Never used  Substance Use Topics  . Alcohol use: Never  . Drug use: Never    Prior to Admission medications   Medication Sig Start Date End Date Taking? Authorizing Provider  acetaminophen (TYLENOL) 325 MG tablet Take 2 tablets (650 mg total) by mouth every 6 (six) hours as needed for mild pain (or Fever >/= 101). Patient taking differently: Take 650 mg by mouth every 6 (six) hours as needed for mild pain or fever.  10/06/20  Yes Nita Sells, MD  azaTHIOprine (IMURAN) 50 MG tablet Take 50 mg by mouth daily. 08/23/20  Yes [provider]  collagenase (SANTYL) ointment Apply topically daily. 11/08/20  Yes Swayze, Ava, DO  cycloSPORINE (SANDIMMUNE) 25 MG capsule Take 75 mg by mouth 2 (two) times  daily.   Yes [provider]  HYDROcodone-acetaminophen (NORCO/VICODIN) 5-325 MG tablet Take 1 tablet by mouth every 4 (four) hours as needed for moderate pain. 11/07/20  Yes Swayze, Ava, DO  insulin glargine (LANTUS) 100 UNIT/ML injection Inject 0.08 mLs (8 Units total) into the skin 2 (two) times daily. 11/07/20  Yes Swayze, Ava, DO  insulin lispro (ADMELOG) 100 UNIT/ML injection Inject 2 Units into the skin 3 (three) times daily before meals.   Yes [provider]  levothyroxine (SYNTHROID) 50 MCG tablet Take 50 mcg by mouth daily. 08/23/20  Yes [provider]  metoprolol tartrate (LOPRESSOR) 25 MG tablet Take 12.5  mg by mouth 2 (two) times daily.  09/15/20  Yes [provider]  nutrition supplement, JUVEN, (JUVEN) PACK Take 1 packet by mouth 2 (two) times daily between meals. 10/06/20  Yes Nita Sells, MD  Nystatin (GERHARDT'S BUTT CREAM) CREA Apply 1 application topically 2 (two) times daily. 11/07/20  Yes Swayze, Ava, DO  predniSONE (DELTASONE) 5 MG tablet Take 5 mg by mouth daily. 07/13/20  Yes [provider]  rosuvastatin (CRESTOR) 5 MG tablet Take 1 tablet (5 mg total) by mouth at bedtime. 11/07/20  Yes Swayze, Ava, DO  sertraline (ZOLOFT) 25 MG tablet Take 1 tablet (25 mg total) by mouth daily. 10/06/20  Yes Nita Sells, MD  sodium bicarbonate 650 MG tablet Take 1 tablet (650 mg total) by mouth 2 (two) times daily. 10/06/20  Yes Nita Sells, MD    Current Facility-Administered Medications  Medication Dose Route Frequency Provider Last Rate Last Admin  . 0.9 %  sodium chloride infusion   Intravenous Continuous Flora Lipps, MD   Stopped at 11/09/20 0820  . acetaminophen (TYLENOL) tablet 650 mg  650 mg Oral Q6H PRN Pokhrel, Laxman, MD      . azaTHIOprine (IMURAN) tablet 50 mg  50 mg Oral Daily Pokhrel, Laxman, MD   50 mg at 11/08/20 2301  . cycloSPORINE (SANDIMMUNE) capsule 75 mg  75 mg Oral BID Pokhrel, Laxman, MD   75 mg at 11/08/20 2302  . hydrALAZINE (APRESOLINE) injection 10 mg  10 mg Intravenous Q6H PRN Pokhrel, Laxman, MD      . HYDROcodone-acetaminophen (NORCO/VICODIN) 5-325 MG per tablet 1 tablet  1 tablet Oral Q4H PRN Pokhrel, Laxman, MD      . insulin aspart (novoLOG) injection 0-6 Units  0-6 Units Subcutaneous Q6H Pokhrel, Laxman, MD   2 Units at 11/09/20 0222  . insulin glargine (LANTUS) injection 8 Units  8 Units Subcutaneous QHS Pokhrel, Laxman, MD   8 Units at 11/08/20 2301  . levothyroxine (SYNTHROID) tablet 50 mcg  50 mcg Oral Daily Pokhrel, Laxman, MD      . metoprolol tartrate (LOPRESSOR) tablet 12.5 mg  12.5 mg Oral BID Pokhrel,  Laxman, MD   12.5 mg at 11/09/20 0215  . ondansetron (ZOFRAN) tablet 4 mg  4 mg Oral Q6H PRN Pokhrel, Laxman, MD       Or  . ondansetron (ZOFRAN) injection 4 mg  4 mg Intravenous Q6H PRN Pokhrel, Laxman, MD      . pantoprazole (PROTONIX) injection 40 mg  40 mg Intravenous Q12H Pokhrel, Laxman, MD      . sodium bicarbonate tablet 650 mg  650 mg Oral BID Pokhrel, Laxman, MD   650 mg at 11/09/20 0215   Current Outpatient Medications  Medication Sig Dispense Refill  . acetaminophen (TYLENOL) 325 MG tablet Take 2 tablets (650 mg total) by mouth every 6 (six) hours  as needed for mild pain (or Fever >/= 101). (Patient taking differently: Take 650 mg by mouth every 6 (six) hours as needed for mild pain or fever. )    . azaTHIOprine (IMURAN) 50 MG tablet Take 50 mg by mouth daily.    . collagenase (SANTYL) ointment Apply topically daily. 15 g 0  . cycloSPORINE (SANDIMMUNE) 25 MG capsule Take 75 mg by mouth 2 (two) times daily.    Marland Kitchen HYDROcodone-acetaminophen (NORCO/VICODIN) 5-325 MG tablet Take 1 tablet by mouth every 4 (four) hours as needed for moderate pain. 6 tablet 0  . insulin glargine (LANTUS) 100 UNIT/ML injection Inject 0.08 mLs (8 Units total) into the skin 2 (two) times daily. 10 mL 11  . insulin lispro (ADMELOG) 100 UNIT/ML injection Inject 2 Units into the skin 3 (three) times daily before meals.    Marland Kitchen levothyroxine (SYNTHROID) 50 MCG tablet Take 50 mcg by mouth daily.    . metoprolol tartrate (LOPRESSOR) 25 MG tablet Take 12.5 mg by mouth 2 (two) times daily.     . nutrition supplement, JUVEN, (JUVEN) PACK Take 1 packet by mouth 2 (two) times daily between meals.  0  . Nystatin (GERHARDT'S BUTT CREAM) CREA Apply 1 application topically 2 (two) times daily. 1 each 0  . predniSONE (DELTASONE) 5 MG tablet Take 5 mg by mouth daily.    . rosuvastatin (CRESTOR) 5 MG tablet Take 1 tablet (5 mg total) by mouth at bedtime. 30 tablet 0  . sertraline (ZOLOFT) 25 MG tablet Take 1 tablet (25 mg total) by  mouth daily. 4 tablet 0  . sodium bicarbonate 650 MG tablet Take 1 tablet (650 mg total) by mouth 2 (two) times daily. 30 tablet 00    Allergies as of 11/08/2020  . (No Known Allergies)     Review of Systems:    Constitutional: No weight loss, fever or chills Skin: No rash  Cardiovascular: No chest pain, chest pressure or palpitations   Respiratory: No SOB  Gastrointestinal: See HPI and otherwise negative Genitourinary: No dysuria  Neurological: No headache Musculoskeletal: No new muscle or joint pain Hematologic: +bruising Psychiatric: No history of depression or anxiety    Physical Exam:  Vital signs in last 24 hours: Temp:  [97.8 F (36.6 C)-98.2 F (36.8 C)] 98.2 F (36.8 C) (11/14 0734) Pulse Rate:  [71-92] 71 (11/14 0830) Resp:  [0-24] 21 (11/14 0830) BP: (153-204)/(74-96) 158/74 (11/14 0830) SpO2:  [95 %-100 %] 96 % (11/14 0830)   General:   Pleasant chronically ill-appearing Caucasian female appears to be in NAD, Well developed, Well nourished, alert and cooperative Head:  Normocephalic and atraumatic. Eyes:   PEERL, EOMI. No icterus. Conjunctiva pink. Ears:  Normal auditory acuity. Neck:  Supple Throat: Oral cavity and pharynx without inflammation, swelling or lesion.  Lungs: Respirations even and unlabored. Lungs clear to auscultation bilaterally.   No wheezes, crackles, or rhonchi.  Heart: Normal S1, S2. No MRG. Regular rate and rhythm. No peripheral edema, cyanosis or pallor.  Abdomen:  Soft, nondistended, nontender. No rebound or guarding. Normal bowel sounds. No appreciable masses or hepatomegaly. Rectal:  Not performed.  Msk:  Symmetrical without gross deformities. Peripheral pulses intact.  Extremities:  Without edema, no deformity or joint abnormality. Neurologic:  Alert and  oriented x4;  grossly normal neurologically.  Skin:   Dry and intact without significant lesions or rashes. Psychiatric: Demonstrates good judgement and reason without abnormal  affect or behaviors.   LAB RESULTS: Recent Labs  11/07/20 0118 11/08/20 1843 11/09/20 0450  WBC 5.1 5.5 5.4  HGB 8.4* 9.8* 9.2*  HCT 27.0* 32.0* 29.0*  PLT 193 223 230   BMET Recent Labs    11/07/20 0118 11/08/20 1843  NA 137 138  K 5.0 4.8  CL 105 102  CO2 24 19*  GLUCOSE 183* 327*  BUN 89* 78*  CREATININE 2.25* 1.85*  CALCIUM 9.2 9.7   LFT Recent Labs    11/08/20 1843  PROT 5.9*  ALBUMIN 2.8*  AST 30  ALT 18  ALKPHOS 72  BILITOT 1.8*     Impression / Plan:   Impression: 1.  Upper GI bleed: Description of nausea and vomiting and coffee-ground emesis yesterday, hemoglobin stable 8.4--> 9.8 overnight--> 9.2, no further episodes of vomiting overnight; suspect n/v may have been from food? And most likely mallory weiss tear with no other obvious risk factors for gi bleed 2.  CKD stage III: Recent admission for AKI discharge 11/07/2020 2 nursing facility 3.  Diabetes 4.  Right BKA 10/01/2020  Plan: 1.  Patient is currently stable and hemoglobin remained stable.  High suspicion for Mallory-Weiss tear given multiple episodes of vomiting prior to seeing coffee grounds. 2.  Continue to monitor hemoglobin with transfusion as needed less than 7 3.  We will arrange for EGD tomorrow with Dr. Henrene Pastor.  Did discuss risks, benefits, limitations and alternatives and the patient agrees to proceed. 4.  Patient can have clear liquid diet today as tolerated and n.p.o. after midnight 5.  Agree with IV PPI 6.  Please await any further recommendations from Dr. Tarri Glenn later today.  Thank you for your kind consultation, we will continue to follow.  Lavone Nian [facility]  11/09/2020, 9:16 AM

## 2020-11-09 NOTE — Consult Note (Signed)
Consultation  Referring Provider:   Dr. Sloan Leiter Primary Care Physician:  [name], MD Primary Gastroenterologist:   Althia Forts      Reason for Consultation: Nausea/vomiting/coffee-ground emesis            HPI:   [name] is a 59 y.o. 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    Today, the patient is found laying in bed by her husband, she explains that she got to a nursing facility on Friday and they gave her a chili dog which she ate and 3 hours later at around 9:00 she became severely nauseous and then started vomiting and early hours of the morning, she had at least 6-7 episodes of vomiting yesterday and when she presented to the ER she proceeded to have 2 further episodes which had some "coffee grounds" and then per staff here.  Tells me she has not had any vomiting since then but remains quite nauseous.  Tells me her stomach is just "uneasy", not necessarily uncomfortable.  Denies previous history of heartburn, reflux or upper GI symptoms.  Apparently has had a colonoscopy about 5 years ago when he lived in [place].  This was normal per her.  No history of similar symptoms.  She thinks it was just "food poisoning".      Patient is on Prednisone chronically for renal transplant.    Denies fever, chills, weight loss or change in bowel habits.  ED course: Hemoglobin 9.8 (8.4 on 11/07/20), creatinine 1.8, given 1 L of IV fluid bolus and Protonix IV  GI history: In [place], we do not have records, last colonoscopy 5 years ago per patient, no prior EGD  Past Medical History:  Diagnosis Date  . Anemia of chronic renal failure   . Diabetes  mellitus without complication [facility])     Past Surgical History:  Procedure Laterality Date  . AMPUTATION Right 10/01/2020   Procedure: RIGHT BELOW KNEE AMPUTATION;  Surgeon: Newt Minion, MD;  Location: [facility];  Service: Orthopedics;  Laterality: Right;  . BLADDER SURGERY    . KIDNEY TRANSPLANT    . LEG SURGERY      Family History  Problem Relation Age of Onset  . Hypertension Mother   . Hypertension Father     Social History   Tobacco Use  . Smoking status: Never Smoker  . Smokeless tobacco: Never Used  Vaping Use  . Vaping Use: Never used  Substance Use Topics  . Alcohol use: Never  . Drug use: Never    Prior to Admission medications   Medication Sig Start Date End Date Taking? Authorizing Provider  acetaminophen (TYLENOL) 325 MG tablet Take 2 tablets (650 mg total) by mouth every 6 (six) hours as needed for mild pain (or Fever >/= 101). Patient taking differently: Take 650 mg by mouth every 6 (six) hours as needed for mild pain or fever.  10/06/20  Yes Nita Sells, MD  azaTHIOprine (IMURAN) 50 MG tablet Take 50 mg by mouth daily. 08/23/20  Yes [provider]  collagenase (SANTYL) ointment Apply topically daily. 11/08/20  Yes Swayze, Ava, DO  cycloSPORINE (SANDIMMUNE) 25 MG capsule Take 75 mg by mouth 2 (two) times  daily.   Yes [provider]  HYDROcodone-acetaminophen (NORCO/VICODIN) 5-325 MG tablet Take 1 tablet by mouth every 4 (four) hours as needed for moderate pain. 11/07/20  Yes Swayze, Ava, DO  insulin glargine (LANTUS) 100 UNIT/ML injection Inject 0.08 mLs (8 Units total) into the skin 2 (two) times daily. 11/07/20  Yes Swayze, Ava, DO  insulin lispro (ADMELOG) 100 UNIT/ML injection Inject 2 Units into the skin 3 (three) times daily before meals.   Yes [provider]  levothyroxine (SYNTHROID) 50 MCG tablet Take 50 mcg by mouth daily. 08/23/20  Yes [provider]  metoprolol tartrate (LOPRESSOR) 25 MG tablet Take 12.5  mg by mouth 2 (two) times daily.  09/15/20  Yes [provider]  nutrition supplement, JUVEN, (JUVEN) PACK Take 1 packet by mouth 2 (two) times daily between meals. 10/06/20  Yes Nita Sells, MD  Nystatin (GERHARDT'S BUTT CREAM) CREA Apply 1 application topically 2 (two) times daily. 11/07/20  Yes Swayze, Ava, DO  predniSONE (DELTASONE) 5 MG tablet Take 5 mg by mouth daily. 07/13/20  Yes [provider]  rosuvastatin (CRESTOR) 5 MG tablet Take 1 tablet (5 mg total) by mouth at bedtime. 11/07/20  Yes Swayze, Ava, DO  sertraline (ZOLOFT) 25 MG tablet Take 1 tablet (25 mg total) by mouth daily. 10/06/20  Yes Nita Sells, MD  sodium bicarbonate 650 MG tablet Take 1 tablet (650 mg total) by mouth 2 (two) times daily. 10/06/20  Yes Nita Sells, MD    Current Facility-Administered Medications  Medication Dose Route Frequency Provider Last Rate Last Admin  . 0.9 %  sodium chloride infusion   Intravenous Continuous Flora Lipps, MD   Stopped at 11/09/20 0820  . acetaminophen (TYLENOL) tablet 650 mg  650 mg Oral Q6H PRN Pokhrel, Laxman, MD      . azaTHIOprine (IMURAN) tablet 50 mg  50 mg Oral Daily Pokhrel, Laxman, MD   50 mg at 11/08/20 2301  . cycloSPORINE (SANDIMMUNE) capsule 75 mg  75 mg Oral BID Pokhrel, Laxman, MD   75 mg at 11/08/20 2302  . hydrALAZINE (APRESOLINE) injection 10 mg  10 mg Intravenous Q6H PRN Pokhrel, Laxman, MD      . HYDROcodone-acetaminophen (NORCO/VICODIN) 5-325 MG per tablet 1 tablet  1 tablet Oral Q4H PRN Pokhrel, Laxman, MD      . insulin aspart (novoLOG) injection 0-6 Units  0-6 Units Subcutaneous Q6H Pokhrel, Laxman, MD   2 Units at 11/09/20 0222  . insulin glargine (LANTUS) injection 8 Units  8 Units Subcutaneous QHS Pokhrel, Laxman, MD   8 Units at 11/08/20 2301  . levothyroxine (SYNTHROID) tablet 50 mcg  50 mcg Oral Daily Pokhrel, Laxman, MD      . metoprolol tartrate (LOPRESSOR) tablet 12.5 mg  12.5 mg Oral BID Pokhrel,  Laxman, MD   12.5 mg at 11/09/20 0215  . ondansetron (ZOFRAN) tablet 4 mg  4 mg Oral Q6H PRN Pokhrel, Laxman, MD       Or  . ondansetron (ZOFRAN) injection 4 mg  4 mg Intravenous Q6H PRN Pokhrel, Laxman, MD      . pantoprazole (PROTONIX) injection 40 mg  40 mg Intravenous Q12H Pokhrel, Laxman, MD      . sodium bicarbonate tablet 650 mg  650 mg Oral BID Pokhrel, Laxman, MD   650 mg at 11/09/20 0215   Current Outpatient Medications  Medication Sig Dispense Refill  . acetaminophen (TYLENOL) 325 MG tablet Take 2 tablets (650 mg total) by mouth every 6 (six) hours  as needed for mild pain (or Fever >/= 101). (Patient taking differently: Take 650 mg by mouth every 6 (six) hours as needed for mild pain or fever. )    . azaTHIOprine (IMURAN) 50 MG tablet Take 50 mg by mouth daily.    . collagenase (SANTYL) ointment Apply topically daily. 15 g 0  . cycloSPORINE (SANDIMMUNE) 25 MG capsule Take 75 mg by mouth 2 (two) times daily.    Marland Kitchen HYDROcodone-acetaminophen (NORCO/VICODIN) 5-325 MG tablet Take 1 tablet by mouth every 4 (four) hours as needed for moderate pain. 6 tablet 0  . insulin glargine (LANTUS) 100 UNIT/ML injection Inject 0.08 mLs (8 Units total) into the skin 2 (two) times daily. 10 mL 11  . insulin lispro (ADMELOG) 100 UNIT/ML injection Inject 2 Units into the skin 3 (three) times daily before meals.    Marland Kitchen levothyroxine (SYNTHROID) 50 MCG tablet Take 50 mcg by mouth daily.    . metoprolol tartrate (LOPRESSOR) 25 MG tablet Take 12.5 mg by mouth 2 (two) times daily.     . nutrition supplement, JUVEN, (JUVEN) PACK Take 1 packet by mouth 2 (two) times daily between meals.  0  . Nystatin (GERHARDT'S BUTT CREAM) CREA Apply 1 application topically 2 (two) times daily. 1 each 0  . predniSONE (DELTASONE) 5 MG tablet Take 5 mg by mouth daily.    . rosuvastatin (CRESTOR) 5 MG tablet Take 1 tablet (5 mg total) by mouth at bedtime. 30 tablet 0  . sertraline (ZOLOFT) 25 MG tablet Take 1 tablet (25 mg total) by  mouth daily. 4 tablet 0  . sodium bicarbonate 650 MG tablet Take 1 tablet (650 mg total) by mouth 2 (two) times daily. 30 tablet 00    Allergies as of 11/08/2020  . (No Known Allergies)     Review of Systems:    Constitutional: No weight loss, fever or chills Skin: No rash  Cardiovascular: No chest pain, chest pressure or palpitations   Respiratory: No SOB  Gastrointestinal: See HPI and otherwise negative Genitourinary: No dysuria  Neurological: No headache Musculoskeletal: No new muscle or joint pain Hematologic: +bruising Psychiatric: No history of depression or anxiety    Physical Exam:  Vital signs in last 24 hours: Temp:  [97.8 F (36.6 C)-98.2 F (36.8 C)] 98.2 F (36.8 C) (11/14 0734) Pulse Rate:  [71-92] 71 (11/14 0830) Resp:  [0-24] 21 (11/14 0830) BP: (153-204)/(74-96) 158/74 (11/14 0830) SpO2:  [95 %-100 %] 96 % (11/14 0830)   General:   Pleasant chronically ill-appearing Caucasian female appears to be in NAD, Well developed, Well nourished, alert and cooperative Head:  Normocephalic and atraumatic. Eyes:   PEERL, EOMI. No icterus. Conjunctiva pink. Ears:  Normal auditory acuity. Neck:  Supple Throat: Oral cavity and pharynx without inflammation, swelling or lesion.  Lungs: Respirations even and unlabored. Lungs clear to auscultation bilaterally.   No wheezes, crackles, or rhonchi.  Heart: Normal S1, S2. No MRG. Regular rate and rhythm. No peripheral edema, cyanosis or pallor.  Abdomen:  Soft, nondistended, nontender. No rebound or guarding. Normal bowel sounds. No appreciable masses or hepatomegaly. Rectal:  Not performed.  Msk:  Symmetrical without gross deformities. Peripheral pulses intact.  Extremities:  Without edema, no deformity or joint abnormality. Neurologic:  Alert and  oriented x4;  grossly normal neurologically.  Skin:   Dry and intact without significant lesions or rashes. Psychiatric: Demonstrates good judgement and reason without abnormal  affect or behaviors.   LAB RESULTS: Recent Labs  11/07/20 0118 11/08/20 1843 11/09/20 0450  WBC 5.1 5.5 5.4  HGB 8.4* 9.8* 9.2*  HCT 27.0* 32.0* 29.0*  PLT 193 223 230   BMET Recent Labs    11/07/20 0118 11/08/20 1843  NA 137 138  K 5.0 4.8  CL 105 102  CO2 24 19*  GLUCOSE 183* 327*  BUN 89* 78*  CREATININE 2.25* 1.85*  CALCIUM 9.2 9.7   LFT Recent Labs    11/08/20 1843  PROT 5.9*  ALBUMIN 2.8*  AST 30  ALT 18  ALKPHOS 72  BILITOT 1.8*     Impression / Plan:   Impression: 1.  Upper GI bleed: Description of nausea and vomiting and coffee-ground emesis yesterday, hemoglobin stable 8.4--> 9.8 overnight--> 9.2, no further episodes of vomiting overnight; suspect n/v may have been from food? And most likely mallory weiss tear with no other obvious risk factors for gi bleed 2.  CKD stage III: Recent admission for AKI discharge 11/07/2020 2 nursing facility 3.  Diabetes 4.  Right BKA 10/01/2020  Plan: 1.  Patient is currently stable and hemoglobin remained stable.  High suspicion for Mallory-Weiss tear given multiple episodes of vomiting prior to seeing coffee grounds. 2.  Continue to monitor hemoglobin with transfusion as needed less than 7 3.  We will arrange for EGD tomorrow with Dr. Henrene Pastor.  Did discuss risks, benefits, limitations and alternatives and the patient agrees to proceed. 4.  Patient can have clear liquid diet today as tolerated and n.p.o. after midnight 5.  Agree with IV PPI 6.  Please await any further recommendations from Dr. Tarri Glenn later today.  Thank you for your kind consultation, we will continue to follow.  Lavone Nian [facility]  11/09/2020, 9:16 AM

## 2020-11-10 ENCOUNTER — Inpatient Hospital Stay (HOSPITAL_COMMUNITY): Payer: BC Managed Care – PPO | Admitting: Certified Registered"

## 2020-11-10 ENCOUNTER — Encounter (HOSPITAL_COMMUNITY): Payer: Self-pay | Admitting: Internal Medicine

## 2020-11-10 ENCOUNTER — Encounter (HOSPITAL_COMMUNITY)
Admission: EM | Disposition: A | Discharge status: Skilled Nursing Facility | Payer: Self-pay | Source: Skilled Nursing Facility | Attending: Internal Medicine

## 2020-11-10 DIAGNOSIS — K920 Hematemesis: Secondary | ICD-10-CM

## 2020-11-10 DIAGNOSIS — R112 Nausea with vomiting, unspecified: Secondary | ICD-10-CM

## 2020-11-10 DIAGNOSIS — K2101 Gastro-esophageal reflux disease with esophagitis, with bleeding: Secondary | ICD-10-CM

## 2020-11-10 HISTORY — PX: ESOPHAGOGASTRODUODENOSCOPY (EGD) WITH PROPOFOL: SHX5813

## 2020-11-10 LAB — GLUCOSE, CAPILLARY
Glucose-Capillary: 114 mg/dL — ABNORMAL HIGH (ref 70–99)
Glucose-Capillary: 146 mg/dL — ABNORMAL HIGH (ref 70–99)
Glucose-Capillary: 155 mg/dL — ABNORMAL HIGH (ref 70–99)
Glucose-Capillary: 238 mg/dL — ABNORMAL HIGH (ref 70–99)
Glucose-Capillary: 81 mg/dL (ref 70–99)

## 2020-11-10 LAB — BASIC METABOLIC PANEL
Anion gap: 8 (ref 5–15)
BUN: 52 mg/dL — ABNORMAL HIGH (ref 6–20)
CO2: 24 mmol/L (ref 22–32)
Calcium: 9 mg/dL (ref 8.9–10.3)
Chloride: 107 mmol/L (ref 98–111)
Creatinine, Ser: 1.26 mg/dL — ABNORMAL HIGH (ref 0.44–1.00)
GFR, Estimated: 49 mL/min — ABNORMAL LOW (ref >60)
Glucose, Bld: 111 mg/dL — ABNORMAL HIGH (ref 70–99)
Potassium: 3.7 mmol/L (ref 3.5–5.1)
Sodium: 139 mmol/L (ref 135–145)

## 2020-11-10 LAB — CBC WITH DIFFERENTIAL/PLATELET
Abs Immature Granulocytes: 0.05 10*3/uL (ref 0.00–0.07)
Basophils Absolute: 0 10*3/uL (ref 0.0–0.1)
Basophils Relative: 1 %
Eosinophils Absolute: 0.2 10*3/uL (ref 0.0–0.5)
Eosinophils Relative: 4 %
HCT: 27.1 % — ABNORMAL LOW (ref 36.0–46.0)
Hemoglobin: 8.3 g/dL — ABNORMAL LOW (ref 12.0–15.0)
Immature Granulocytes: 1 %
Lymphocytes Relative: 20 %
Lymphs Abs: 0.9 10*3/uL (ref 0.7–4.0)
MCH: 33.7 pg (ref 26.0–34.0)
MCHC: 30.6 g/dL (ref 30.0–36.0)
MCV: 110.2 fL — ABNORMAL HIGH (ref 80.0–100.0)
Monocytes Absolute: 0.5 10*3/uL (ref 0.1–1.0)
Monocytes Relative: 10 %
Neutro Abs: 3 10*3/uL (ref 1.7–7.7)
Neutrophils Relative %: 64 %
Platelets: 213 10*3/uL (ref 150–400)
RBC: 2.46 MIL/uL — ABNORMAL LOW (ref 3.87–5.11)
RDW: 19.2 % — ABNORMAL HIGH (ref 11.5–15.5)
WBC: 4.7 10*3/uL (ref 4.0–10.5)
nRBC: 0 % (ref 0.0–0.2)

## 2020-11-10 LAB — MAGNESIUM: Magnesium: 1.5 mg/dL — ABNORMAL LOW (ref 1.7–2.4)

## 2020-11-10 SURGERY — ESOPHAGOGASTRODUODENOSCOPY (EGD) WITH PROPOFOL
Anesthesia: Monitor Anesthesia Care

## 2020-11-10 MED ORDER — DEXTROSE 50 % IV SOLN
12.5000 g | Freq: Once | INTRAVENOUS | Status: AC
  Administered 2020-11-10: 12.5 g via INTRAVENOUS
  Filled 2020-11-10: qty 50

## 2020-11-10 MED ORDER — PROPOFOL 500 MG/50ML IV EMUL
INTRAVENOUS | Status: DC | PRN
  Administered 2020-11-10: 150 ug/kg/min via INTRAVENOUS

## 2020-11-10 MED ORDER — COLLAGENASE 250 UNIT/GM EX OINT
TOPICAL_OINTMENT | Freq: Every day | CUTANEOUS | Status: DC
  Filled 2020-11-10: qty 30

## 2020-11-10 MED ORDER — MAGNESIUM SULFATE 2 GM/50ML IV SOLN
2.0000 g | Freq: Once | INTRAVENOUS | Status: AC
  Administered 2020-11-10: 2 g via INTRAVENOUS
  Filled 2020-11-10: qty 50

## 2020-11-10 MED ORDER — LACTATED RINGERS IV SOLN: INTRAVENOUS | Status: DC | PRN

## 2020-11-10 MED ORDER — PROPOFOL 500 MG/50ML IV EMUL
INTRAVENOUS | Status: AC
  Filled 2020-11-10: qty 50

## 2020-11-10 MED ORDER — HYDROCODONE-ACETAMINOPHEN 5-325 MG PO TABS: 1.0000 | ORAL_TABLET | Freq: Four times a day (QID) | ORAL | 0 refills | Status: AC | PRN

## 2020-11-10 MED ORDER — MORPHINE SULFATE (PF) 2 MG/ML IV SOLN
1.0000 mg | Freq: Once | INTRAVENOUS | Status: AC
  Administered 2020-11-10: 1 mg via INTRAVENOUS
  Filled 2020-11-10: qty 1

## 2020-11-10 SURGICAL SUPPLY — 15 items

## 2020-11-10 NOTE — NC FL2 (Signed)
[place] LEVEL OF CARE SCREENING TOOL     IDENTIFICATION  Patient Name: [name] Birthdate: Feb 10, 1961 Sex: female Admission Date (Current Location): 11/08/2020  [facility] and [place] Number:  Herbalist and Address:  [facility],  [place] [address], [place]      Provider Number: [id number]  Attending Physician Name and Address:  Barb Merino, MD  Relative Name and Phone Number:  [phone]    Current Level of Care: Hospital Recommended Level of Care: [place] Prior Approval Number:    Date Approved/Denied:   PASRR Number: [id number]  Discharge Plan: SNF    Current Diagnoses: Patient Active Problem List   Diagnosis Date Noted  . Coffee ground emesis   . Gastroesophageal reflux disease with esophagitis and hemorrhage   . Long-term use of immunosuppressant medication   . GI bleed 11/08/2020  . AKI (acute kidney injury) ([place]) 10/29/2020  . Gangrene of right foot ([place])   . Subacute osteomyelitis of right foot ([place])   . Diabetes mellitus with hyperglycemia ([place]) 09/27/2020  . Pressure injury of skin 09/27/2020  . Diabetic ketoacidosis ([place]) 09/26/2020  . Acute kidney injury superimposed on CKD ([place]) 09/26/2020  . Cellulitis and abscess of right leg 09/26/2020  . 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 History of simultaneous kidney and pancreas transplant ([place]) 05/14/2020  . Chronic kidney disease (CKD) stage G3a/A1, moderately decreased glomerular filtration rate (GFR) between 45-59 mL/min/1.73 square meter and albuminuria creatinine ratio less than 30 mg/g (HCC) 05/14/2020  . Hypertension 05/14/2020    Orientation RESPIRATION BLADDER Height & Weight     Self, Time, Situation  Normal Incontinent Weight: 49.9 kg Height:  5\' 3"  (160 cm)  BEHAVIORAL SYMPTOMS/MOOD NEUROLOGICAL BOWEL NUTRITION STATUS      Continent Diet (Soft)  AMBULATORY STATUS COMMUNICATION OF NEEDS Skin    Extensive Assist Verbally PU Stage and Appropriate Care, Hydro Therapy (unstageable sacrum;R BKA)                       Personal Care Assistance Level of Assistance  Bathing, Feeding, Dressing Bathing Assistance: Maximum assistance Feeding assistance: Limited assistance Dressing Assistance: Limited assistance     Functional Limitations Info  Sight, Hearing, Speech Sight Info: Impaired (eyeglasses) Hearing Info: Adequate Speech Info: Adequate    SPECIAL CARE FACTORS FREQUENCY  PT (By licensed PT), OT (By licensed OT)     PT Frequency: 5x week OT Frequency: 5x week            Contractures Contractures Info: Not present    Additional Factors Info  Code Status, Allergies, Insulin Sliding Scale Code Status Info:  (Full) Allergies Info:  (NKA)   Insulin Sliding Scale Info:  (SSI See MAR)       Current Medications (11/10/2020):  This is the current hospital active medication list Current Facility-Administered Medications  Medication Dose Route Frequency Provider Last Rate Last Admin  . acetaminophen (TYLENOL) tablet 650 mg  650 mg Oral Q6H PRN Irene Shipper, MD      . azaTHIOprine [facility]) tablet 50 mg  50 mg Oral Daily Irene Shipper, MD   50 mg at 11/10/20 0354  . collagenase (SANTYL) ointment   Topical Daily Irene Shipper, MD      . cycloSPORINE (SANDIMMUNE) capsule 75 mg  75 mg Oral BID Irene Shipper, MD   75 mg at 11/10/20 0834  . hydrALAZINE (APRESOLINE) injection 10 mg  10 mg Intravenous Q6H PRN Irene Shipper, MD   10 mg at 11/10/20 1445  . HYDROcodone-acetaminophen (NORCO/VICODIN) 5-325 MG per tablet 1 tablet  1 tablet Oral Q4H PRN Irene Shipper, MD   1 tablet at 11/10/20 1447  . insulin aspart (novoLOG) injection 0-6 Units  0-6 Units Subcutaneous Q6H Irene Shipper, MD   1 Units at 11/10/20 0026  . insulin glargine (LANTUS) injection 8 Units  8 Units Subcutaneous QHS Irene Shipper, MD   8 Units at 11/09/20 2201  . levothyroxine (SYNTHROID) tablet 50 mcg  50 mcg  Oral Q0600 Irene Shipper, MD   50 mcg at 11/09/20 1103  . metoprolol tartrate (LOPRESSOR) tablet 12.5 mg  12.5 mg Oral BID Irene Shipper, MD   12.5 mg at 11/10/20 0071  . ondansetron (ZOFRAN) tablet 4 mg  4 mg Oral Q6H PRN Irene Shipper, MD       Or  . ondansetron [facility]) injection 4 mg  4 mg Intravenous Q6H PRN Irene Shipper, MD   4 mg at 11/10/20 0902  . pantoprazole (PROTONIX) injection 40 mg  40 mg Intravenous Q12H Irene Shipper, MD   40 mg at 11/10/20 2197  . predniSONE (DELTASONE) tablet 5 mg  5 mg Oral Q breakfast Irene Shipper, MD   5 mg at 11/10/20 5883  . sodium bicarbonate tablet 650 mg  650 mg Oral BID Irene Shipper, MD   650 mg at 11/10/20 2549     Discharge Medications: Please see discharge summary for a list of discharge medications.  Relevant Imaging Results:  Relevant Lab Results:   Additional Information IY#[id number]  Dessa Phi, RN

## 2020-11-10 NOTE — TOC Initial Note (Signed)
Transition of Care [facility]) - Initial/Assessment Note    Patient Details  Name: [name] MRN: [id number] Date of Birth: September 06, 1961  Transition of Care [facility]) CM/SW Contact:    Dessa Phi, RN Phone Number: 11/10/2020, 3:51 PM  Clinical Narrative:Recent SNF admission @ [facility] than 24hrs;[facility] declined to accept back-patient's son [name] also decline for patient to return back. Faxed out await bed offers.                     Barriers to Discharge: Continued Medical Work up   Patient Goals and CMS Choice        Expected Discharge Plan and Services                                                Prior Living Arrangements/Services                       Activities of Daily Living Home Assistive Devices/Equipment: Wheelchair ADL Screening (condition at time of admission) Patient's cognitive ability adequate to safely complete daily activities?: Yes Is the patient deaf or have difficulty hearing?: Yes Does the patient have difficulty seeing, even when wearing glasses/contacts?: No Does the patient have difficulty concentrating, remembering, or making decisions?: No Patient able to express need for assistance with ADLs?: Yes Does the patient have difficulty dressing or bathing?: No Independently performs ADLs?: Yes (appropriate for developmental age) Does the patient have difficulty walking or climbing stairs?: Yes Weakness of Legs: Both Weakness of Arms/Hands: None  Permission Sought/Granted                  Emotional Assessment              Admission diagnosis:  GI bleed [K92.2] Coffee ground emesis [K92.0] Nausea and vomiting, intractability of vomiting not specified, unspecified vomiting type [R11.2] Patient Active Problem List   Diagnosis Date Noted  . Coffee ground emesis   . Gastroesophageal reflux disease with esophagitis and hemorrhage   . Long-term use of immunosuppressant medication   . GI bleed 11/08/2020  . AKI (acute  kidney injury) ([place]) 10/29/2020  . Gangrene of right foot ([place])   . Subacute osteomyelitis of right foot ([place])   . Diabetes mellitus with hyperglycemia ([place]) 09/27/2020  . Pressure injury of skin 09/27/2020  . Diabetic ketoacidosis ([place]) 09/26/2020  . Acute kidney injury superimposed on CKD ([place]) 09/26/2020  . Cellulitis and abscess of right leg 09/26/2020  . 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 History of simultaneous kidney and pancreas transplant ([place]) 05/14/2020  . Chronic kidney disease (CKD) stage G3a/A1, moderately decreased glomerular filtration rate (GFR) between 45-59 mL/min/1.73 square meter and albuminuria creatinine ratio less than 30 mg/g (HCC) 05/14/2020  . Hypertension 05/14/2020   PCP:  [name], MD Pharmacy:   [id number] - [place], [place] [address] [place] [address] Phone: [phone] Fax: [phone]     Social Determinants of Health (SDOH) Interventions    Readmission Risk Interventions No flowsheet data found.

## 2020-11-10 NOTE — Progress Notes (Signed)
25 staples removed from R BKA incision.

## 2020-11-10 NOTE — Consult Note (Signed)
[facility] Nurse wound follow up Wound type: Unstageable pressure injury to left heel and sacrum.  Right BKA staple line.  Measurement: Wound KIC:HTVGVS 5 cm x 5 cm 100% slough to wound bed. LEft heel 1.5 cm x 1 cm 100% slough to wound bed.  Right stump: 0 cm x 4 cm staple line intact Drainage (amount, consistency, odor)moderate serosanguinous  No odor.   Periwound:MASD to buttocks Dressing procedure/placement/frequency:  Cleanse wounds to left heel and sacrum with NS and pat dry.  Apply Santyl to wound bed. Cover with NS moist gauze. Secure with foam dressing  Change daily.  Will request PT evaluate for hydrotherapy.  Will not follow at this time.  Please re-consult if needed.  Domenic Moras MSN, RN, FNP-BC CWON Wound, Ostomy, Continence Nurse Pager [phone]

## 2020-11-10 NOTE — Interval H&P Note (Signed)
History and Physical Interval Note:  11/10/2020 12:37 PM  [name]  has presented today for surgery, with the diagnosis of Coffee-ground emesis.  The various methods of treatment have been discussed with the patient and family. After consideration of risks, benefits and other options for treatment, the patient has consented to  Procedure(s): ESOPHAGOGASTRODUODENOSCOPY (EGD) WITH PROPOFOL (N/A) as a surgical intervention.  The patient's history has been reviewed, patient examined, no change in status, stable for surgery.  I have reviewed the patient's chart and labs.  Questions were answered to the patient's satisfaction.     [name]

## 2020-11-10 NOTE — Interval H&P Note (Signed)
History and Physical Interval Note:  11/10/2020 12:36 PM  [name]  has presented today for surgery, with the diagnosis of Coffee-ground emesis.  The various methods of treatment have been discussed with the patient and family. After consideration of risks, benefits and other options for treatment, the patient has consented to  Procedure(s): ESOPHAGOGASTRODUODENOSCOPY (EGD) WITH PROPOFOL (N/A) as a surgical intervention.  The patient's history has been reviewed, patient examined, no change in status, stable for surgery.  I have reviewed the patient's chart and labs.  Questions were answered to the patient's satisfaction.     [name]

## 2020-11-10 NOTE — Progress Notes (Signed)
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 Apparently went to a skilled nursing facility on 11/12, patient reports eating hot chili and starting symptoms. Does not have history of GI ulcers. She is on chronic prednisone therapy. Does not use any NSAIDs. In the ER hemoglobin 9.8 which is fairly stable. Creatinine 1.8 which is at baseline. Started on Protonix, suspected upper GI bleeding or Mallory-Weiss tear and admitted to the hospital.   Assessment & Plan:   Principal Problem:   GI bleed Active Problems:   History of simultaneous kidney and pancreas transplant ([place])   Chronic kidney disease (CKD) stage G3a/A1, moderately decreased glomerular filtration rate (GFR) between 45-59 mL/min/1.73 square meter and albuminuria creatinine ratio less than 30 mg/g (HCC)   Hypertension   Diabetes mellitus with hyperglycemia (HCC)   Pressure injury of skin   Long-term use of immunosuppressant medication   Coffee ground emesis   Gastroesophageal reflux disease with esophagitis and hemorrhage  Upper GI bleeding/coffee-ground emesis/intractable nausea and vomiting: Underwent upper GI endoscopy today, found to have erosive esophagitis.  Biopsies pending. On Protonix 40 mg IV twice daily.  Will change to Protonix 40 mg oral twice daily. Hemoglobin dropped from 9-8.  Currently no indication for transfusion.  CKD stage IIIa: Known kidney and pancreas transplant. Creatinine at about baseline. We  will continue to monitor to stabilize. Patient is on chronic suppression therapy with low-dose prednisone, cyclosporine and azathioprine that we will continue as her renal functions are normal.  Type 2 diabetes on insulin: Well controlled.  Earlier hypoglycemic episode due to n.p.o. status.  Resume insulin when she starts eating today.  Hypothyroidism: On Synthroid.  Sacral decubitus ulcer stage III, multiple left heel ulcerations: Multiple pressure ulcers present. Wound care.  Right BKA on 10/6: Stump healing well. Patient is still has surgical staples present for more than 5 weeks.  Updated patient surgeon, order to remove staples.  Hypomagnesemia: Significantly low.  Will replace with IV magnesium today.  Advanced physical debility: Patient is with debilitated and frail.  She will be able to go back to a SNF to continue rehab..   DVT prophylaxis: SCDs Start: 11/09/20 0145   Code Status: Full code Family Communication: Husband at the bedside Disposition Plan: Status is: Inpatient  Remains inpatient appropriate because:Inpatient level of care appropriate due to severity of illness   Dispo: The patient is from: SNF              Anticipated d/c is to: SNF              Anticipated d/c date is: Architectural technologist.              Patient currently is not medically stable to d/c.  Going for procedure today.     Consultants:   Gastroenterology  Procedures:   None  Antimicrobials:   None   Subjective: Patient seen and examined.  She was without any symptoms today morning.  She was going for procedure.  In the afternoon she underwent EGD and found to have erosive esophagitis with no  other complications or active ulcers.  Repeat hemoglobin is 8.3. Discussed about going back to a skilled nursing facility and patient was not very sure whether she wants to go back to the same facility.  Objective: Vitals:   11/10/20 0535 11/10/20 1222 11/10/20 1332 11/10/20 1344  BP: 136/64 (!) 158/66  (!) 149/66 (!) 178/78  Pulse: 86 79 77 80  Resp: 18 14 19  (!) 0  Temp: 99.1 F (37.3 C) 98.4 F (36.9 C) 98.1 F (36.7 C)   TempSrc: Oral Oral Oral   SpO2: 92% (!) 88% 97% 92%  Weight:  49.9 kg    Height:  5\' 3"  (1.6 m)      Intake/Output Summary (Last 24 hours) at 11/10/2020 1348 Last data filed at 11/10/2020 1334 Gross per 24 hour  Intake 792.66 ml  Output 300 ml  Net 492.66 ml   Filed Weights   11/09/20 1100 11/10/20 1222  Weight: 55.8 kg 49.9 kg    Examination:  General exam: Appears calm and comfortable  Chronically sick looking but currently comfortable. Respiratory system: Clear to auscultation. Respiratory effort normal. Cardiovascular system: S1 & S2 heard, RRR.  Gastrointestinal system: Soft and nontender. Bowel sounds present.  Central nervous system: Alert and oriented. No focal neurological deficits. Generalized weakness. Right BKA stump with staples intact, flaky margins. Left heel and foot with multiple pressure ulcers.    Data Reviewed: I have personally reviewed following labs and imaging studies  CBC: Recent Labs  Lab 11/06/20 0043 11/07/20 0118 11/08/20 1843 11/09/20 0450 11/10/20 0910  WBC 5.9 5.1 5.5 5.4 4.7  NEUTROABS 4.0 3.3 4.1  --  3.0  HGB 8.7* 8.4* 9.8* 9.2* 8.3*  HCT 27.9* 27.0* 32.0* 29.0* 27.1*  MCV 106.1* 106.3* 108.8* 107.4* 110.2*  PLT 212 193 223 230 440   Basic Metabolic Panel: Recent Labs  Lab 11/04/20 0513 11/04/20 0513 11/05/20 0218 11/06/20 0043 11/07/20 0118 11/08/20 1843 11/10/20 0910  NA 137   < > 138 137 137 138 139  K 4.6   < > 4.9 5.1 5.0 4.8 3.7  CL 102   < > 104 104 105 102 107  CO2 22   < > 25 22 24  19* 24  GLUCOSE 209*   < > 262* 225* 183* 327* 111*  BUN 95*   < > 89* 91* 89* 78* 52*  CREATININE 2.47*   < > 2.21* 2.27* 2.25* 1.85* 1.26*  CALCIUM 9.9   < > 9.4 9.3 9.2 9.7 9.0  MG  --   --   --   --   --   --  1.5*  PHOS 4.3  --   --   --   --   --   --    < > = values in this interval not  displayed.   GFR: Estimated Creatinine Clearance: 37.4 mL/min (A) (by C-G formula based on SCr of 1.26 mg/dL (H)). Liver Function Tests: Recent Labs  Lab 11/04/20 0513 11/08/20 1843  AST  --  30  ALT  --  18  ALKPHOS  --  72  BILITOT  --  1.8*  PROT  --  5.9*  ALBUMIN 3.3* 2.8*   Recent Labs  Lab 11/08/20 1843  LIPASE 34   No results for input(s): AMMONIA in the last 168 hours. Coagulation Profile: No results for input(s): INR, PROTIME in the last 168 hours. Cardiac Enzymes: No results for input(s): CKTOTAL, CKMB, CKMBINDEX, TROPONINI in the last 168 hours. BNP (last 3  results) No results for input(s): PROBNP in the last 8760 hours. HbA1C: No results for input(s): HGBA1C in the last 72 hours. CBG: Recent Labs  Lab 11/09/20 2039 11/10/20 0022 11/10/20 0539 11/10/20 0647 11/10/20 1149  GLUCAP 152* 155* 81 146* 114*   Lipid Profile: No results for input(s): CHOL, HDL, LDLCALC, TRIG, CHOLHDL, LDLDIRECT in the last 72 hours. Thyroid Function Tests: No results for input(s): TSH, T4TOTAL, FREET4, T3FREE, THYROIDAB in the last 72 hours. Anemia Panel: No results for input(s): VITAMINB12, FOLATE, FERRITIN, TIBC, IRON, RETICCTPCT in the last 72 hours. Sepsis Labs: No results for input(s): PROCALCITON, LATICACIDVEN in the last 168 hours.  Recent Results (from the past 240 hour(s))  SARS Coronavirus 2 by RT PCR (hospital order, performed in [facility] hospital lab) Nasopharyngeal Nasopharyngeal Swab     Status: None   Collection Time: 11/07/20 12:20 PM   Specimen: Nasopharyngeal Swab  Result Value Ref Range Status   SARS Coronavirus 2 NEGATIVE NEGATIVE Final    Comment: (NOTE) SARS-CoV-2 target nucleic acids are NOT DETECTED.  The SARS-CoV-2 RNA is generally detectable in upper and lower respiratory specimens during the acute phase of infection. The lowest concentration of SARS-CoV-2 viral copies this assay can detect is 250 copies / mL. A negative result does not  preclude SARS-CoV-2 infection and should not be used as the sole basis for treatment or other patient management decisions.  A negative result may occur with improper specimen collection / handling, submission of specimen other than nasopharyngeal swab, presence of viral mutation(s) within the areas targeted by this assay, and inadequate number of viral copies (<250 copies / mL). A negative result must be combined with clinical observations, patient history, and epidemiological information.  Fact Sheet for Patients:   StrictlyIdeas.no  Fact Sheet for Healthcare Providers: BankingDealers.co.za  This test is not yet approved or  cleared by the [place] FDA and has been authorized for detection and/or diagnosis of SARS-CoV-2 by FDA under an Emergency Use Authorization (EUA).  This EUA will remain in effect (meaning this test can be used) for the duration of the COVID-19 declaration under Section 564(b)(1) of the Act, 21 U.S.C. section 360bbb-3(b)(1), unless the authorization is terminated or revoked sooner.  Performed at [facility], [place] [address], [place], [place] [address]   Respiratory Panel by RT PCR (Flu A&B, Covid) - Nasopharyngeal Swab     Status: None   Collection Time: 11/08/20  7:14 PM   Specimen: Nasopharyngeal Swab  Result Value Ref Range Status   SARS Coronavirus 2 by RT PCR NEGATIVE NEGATIVE Final    Comment: (NOTE) SARS-CoV-2 target nucleic acids are NOT DETECTED.  The SARS-CoV-2 RNA is generally detectable in upper respiratoy specimens during the acute phase of infection. The lowest concentration of SARS-CoV-2 viral copies this assay can detect is 131 copies/mL. A negative result does not preclude SARS-Cov-2 infection and should not be used as the sole basis for treatment or other patient management decisions. A negative result may occur with  improper specimen collection/handling, submission of specimen  other than nasopharyngeal swab, presence of viral mutation(s) within the areas targeted by this assay, and inadequate number of viral copies (<131 copies/mL). A negative result must be combined with clinical observations, patient history, and epidemiological information. The expected result is Negative.  Fact Sheet for Patients:  PinkCheek.be  Fact Sheet for Healthcare Providers:  GravelBags.it  This test is no t yet approved or cleared by the [place] FDA and  has been  authorized for detection and/or diagnosis of SARS-CoV-2 by FDA under an Emergency Use Authorization (EUA). This EUA will remain  in effect (meaning this test can be used) for the duration of the COVID-19 declaration under Section 564(b)(1) of the Act, 21 U.S.C. section 360bbb-3(b)(1), unless the authorization is terminated or revoked sooner.     Influenza A by PCR NEGATIVE NEGATIVE Final   Influenza B by PCR NEGATIVE NEGATIVE Final    Comment: (NOTE) The Xpert Xpress SARS-CoV-2/FLU/RSV assay is intended as an aid in  the diagnosis of influenza from Nasopharyngeal swab specimens and  should not be used as a sole basis for treatment. Nasal washings and  aspirates are unacceptable for Xpert Xpress SARS-CoV-2/FLU/RSV  testing.  Fact Sheet for Patients: PinkCheek.be  Fact Sheet for Healthcare Providers: GravelBags.it  This test is not yet approved or cleared by the [place] FDA and  has been authorized for detection and/or diagnosis of SARS-CoV-2 by  FDA under an Emergency Use Authorization (EUA). This EUA will remain  in effect (meaning this test can be used) for the duration of the  Covid-19 declaration under Section 564(b)(1) of the Act, 21  U.S.C. section 360bbb-3(b)(1), unless the authorization is  terminated or revoked. Performed at [facility], [place] [address] [place], [place] [address]   MRSA PCR Screening     Status: None   Collection Time: 11/09/20 11:54 AM   Specimen: Nasal Mucosa; Nasopharyngeal  Result Value Ref Range Status   MRSA by PCR NEGATIVE NEGATIVE Final    Comment:        The GeneXpert MRSA Assay (FDA approved for NASAL specimens only), is one component of a comprehensive MRSA colonization surveillance program. It is not intended to diagnose MRSA infection nor to guide or monitor treatment for MRSA infections. Performed at [facility], [place] [address], [place], [place] [address]          Radiology Studies: No results found.      Scheduled Meds: . [MAR Hold] azaTHIOprine  50 mg Oral Daily  . collagenase   Topical Daily  . [MAR Hold] cycloSPORINE  75 mg Oral BID  . [MAR Hold] insulin aspart  0-6 Units Subcutaneous Q6H  . [MAR Hold] insulin glargine  8 Units Subcutaneous QHS  . [MAR Hold] levothyroxine  50 mcg Oral Q0600  . [MAR Hold] metoprolol tartrate  12.5 mg Oral BID  . [MAR Hold] pantoprazole (PROTONIX) IV  40 mg Intravenous Q12H  . [MAR Hold] predniSONE  5 mg Oral Q breakfast  . [MAR Hold] sodium bicarbonate  650 mg Oral BID   Continuous Infusions: . sodium chloride    . magnesium sulfate bolus IVPB       LOS: 2 days    Time spent: 30 minutes    Barb Merino, MD [facility] Pager [phone]

## 2020-11-10 NOTE — Progress Notes (Signed)
Pts blood sugar is 81. Pt states that she needs something to increase her blood sugar because it drops fast. Pt is jittery. Pt also complains of pain. Pt is NPO.  MD paged via Amion and new orders given for D50 and IV Morphine.  Pts blood sugar is 146 after D50 given. Will continue to monitor.

## 2020-11-10 NOTE — Anesthesia Preprocedure Evaluation (Addendum)
Anesthesia Evaluation  Patient identified by MRN, date of birth, ID band Patient awake    Reviewed: Allergy & Precautions, NPO status , Patient's Chart, lab work & pertinent test results, reviewed documented beta blocker date and time   Airway Mallampati: III  TM Distance: >3 FB Neck ROM: Full  Mouth opening: Limited Mouth Opening  Dental no notable dental hx. (+) Teeth Intact, Dental Advisory Given   Pulmonary neg pulmonary ROS,    Pulmonary exam normal breath sounds clear to auscultation       Cardiovascular hypertension, Pt. on home beta blockers and Pt. on medications Normal cardiovascular exam Rhythm:Regular Rate:Normal     Neuro/Psych negative neurological ROS  negative psych ROS   GI/Hepatic negative GI ROS, Neg liver ROS,   Endo/Other  negative endocrine ROSdiabetes, Insulin Dependent  Renal/GU Renal InsufficiencyRenal disease (Cr 1.26, K 3.7, s/p kidney/pancreas transplant )  negative genitourinary   Musculoskeletal negative musculoskeletal ROS (+)   Abdominal   Peds  Hematology  (+) Blood dyscrasia (Hgb 8.3), anemia ,   Anesthesia Other Findings   Reproductive/Obstetrics                           Anesthesia Physical Anesthesia Plan  ASA: III  Anesthesia Plan: MAC   Post-op Pain Management:    Induction: Intravenous  PONV Risk Score and Plan: 2 and Propofol infusion and Treatment may vary due to age or medical condition  Airway Management Planned: Natural Airway  Additional Equipment:   Intra-op Plan:   Post-operative Plan:   Informed Consent: I have reviewed the patients History and Physical, chart, labs and discussed the procedure including the risks, benefits and alternatives for the proposed anesthesia with the patient or authorized representative who has indicated his/her understanding and acceptance.     Dental advisory given  Plan Discussed with:  CRNA  Anesthesia Plan Comments:         Anesthesia Quick Evaluation

## 2020-11-10 NOTE — Transfer of Care (Signed)
Immediate Anesthesia Transfer of Care Note  Patient: [name]  Procedure(s) Performed: ESOPHAGOGASTRODUODENOSCOPY (EGD) WITH PROPOFOL (N/A )  Patient Location: PACU and Endoscopy Unit  Anesthesia Type:MAC  Level of Consciousness: awake, alert  and patient cooperative  Airway & Oxygen Therapy: Patient Spontanous Breathing and Patient connected to face mask oxygen  Post-op Assessment: Report given to RN and Post -op Vital signs reviewed and stable  Post vital signs: Reviewed and stable  Last Vitals:  Vitals Value Taken Time  BP 149/66 11/10/20 1332  Temp    Pulse 77 11/10/20 1333  Resp 26 11/10/20 1333  SpO2 96 % 11/10/20 1333  Vitals shown include unvalidated device data.  Last Pain:  Vitals:   11/10/20 1222  TempSrc: Oral  PainSc: 0-No pain      Patients Stated Pain Goal: 3 (63/14/97 0263)  Complications: No complications documented.

## 2020-11-10 NOTE — Anesthesia Procedure Notes (Signed)
Procedure Name: MAC Date/Time: 11/10/2020 1:09 PM Performed by: Eben Burow, CRNA Pre-anesthesia Checklist: Patient identified, Emergency Drugs available, Suction available, Patient being monitored and Timeout performed Oxygen Delivery Method: Simple face mask Placement Confirmation: positive ETCO2

## 2020-11-10 NOTE — Op Note (Signed)
[facility] Patient Name: [name] Procedure Date: 11/10/2020 MRN: [id number] Attending MD: Docia Chuck. Henrene Pastor MD, MD Date of Birth: 06/23/1961 CSN: [id number] Age: 59 Admit Type: Inpatient Procedure:                Upper GI endoscopy with biopsies Indications:              Nausea with vomiting; minor coffee grounds Providers:                Docia Chuck. Henrene Pastor MD, MD, Wynonia Sours, RN, Tyrone Apple, Technician, Jefm Miles CRNA Referring MD:             [facility] Medicines:                Monitored Anesthesia Care Complications:            No immediate complications. Estimated Blood Loss:     Estimated blood loss: none. Procedure:                Pre-Anesthesia Assessment:                           - Prior to the procedure, a History and Physical                            was performed, and patient medications and                            allergies were reviewed. The patient's tolerance of                            previous anesthesia was also reviewed. The risks                            and benefits of the procedure and the sedation                            options and risks were discussed with the patient.                            All questions were answered, and informed consent                            was obtained. Prior Anticoagulants: The patient has                            taken no previous anticoagulant or antiplatelet                            agents. ASA Grade Assessment: III - A patient with                            severe systemic disease. After reviewing the risks  and benefits, the patient was deemed in                            satisfactory condition to undergo the procedure.                           After obtaining informed consent, the endoscope was                            passed under direct vision. Throughout the                            procedure, the  patient's blood pressure, pulse, and                            oxygen saturations were monitored continuously. The                            GIF-H190 ([id number] Olympus gastroscope was                            introduced through the mouth, and advanced to the                            second part of duodenum. The upper GI endoscopy was                            accomplished without difficulty. The patient                            tolerated the procedure well. Scope In: Scope Out: Findings:      The esophagus revealed erosive esophagitis and friability at the GEJ.       The mucosa was slightly fleshy. This was biopsied with a cold forceps       for histology.      The stomach was normal save hiatal hernia.      The examined duodenum was normal.      The cardia and gastric fundus were normal on retroflexion. Impression:               1. Erososive esophagitis                           2. Otherwise normal EGD Moderate Sedation:      none Recommendation:           1. Pantoprozole 40 mg TWICE daily                           2. Reflux precautions                           3. Advance diet as tolerated                           4. GI will follow up biopsies  5. Discussed with patient. Copy of her endoscopy                            report was provided to her. GI Will sign off. Procedure Code(s):        --- Professional ---                           [phone], Esophagogastroduodenoscopy, flexible,                            transoral; with biopsy, single or multiple Diagnosis Code(s):        --- Professional ---                           R11.2, Nausea with vomiting, unspecified CPT copyright 2019 American Medical Association. All rights reserved. The codes documented in this report are preliminary and upon coder review may  be revised to meet current compliance requirements. Docia Chuck. Henrene Pastor MD, MD 11/10/2020 1:36:27 PM This report has been signed  electronically. Number of Addenda: 0

## 2020-11-11 LAB — GLUCOSE, CAPILLARY
Glucose-Capillary: 129 mg/dL — ABNORMAL HIGH (ref 70–99)
Glucose-Capillary: 139 mg/dL — ABNORMAL HIGH (ref 70–99)
Glucose-Capillary: 162 mg/dL — ABNORMAL HIGH (ref 70–99)
Glucose-Capillary: 213 mg/dL — ABNORMAL HIGH (ref 70–99)
Glucose-Capillary: 273 mg/dL — ABNORMAL HIGH (ref 70–99)
Glucose-Capillary: 283 mg/dL — ABNORMAL HIGH (ref 70–99)

## 2020-11-11 LAB — SURGICAL PATHOLOGY

## 2020-11-11 MED ORDER — PANTOPRAZOLE SODIUM 40 MG PO TBEC
40.0000 mg | DELAYED_RELEASE_TABLET | Freq: Two times a day (BID) | ORAL | Status: DC
  Administered 2020-11-11 – 2020-11-12 (×4): 40 mg via ORAL
  Filled 2020-11-11 (×4): qty 1

## 2020-11-11 NOTE — Progress Notes (Signed)
PT HYDROTHERAPY EVALUATION   *PT will follow for hydrotherapy/pulsed lavage for sacrum only. Hydrotherapy is not warranted for L heel. Recommend nursing continue daily dressing changes + santyl for L heel.    11/11/20 1300  Subjective Assessment  Subjective "I don't heal well"  Patient and Family Stated Goals  (for wound to heal)  Date of Onset  (present on admission)  Evaluation and Treatment  Evaluation and Treatment Procedures Explained to Patient/Family Yes  Evaluation and Treatment Procedures agreed to  Pressure Injury 09/27/20 Sacrum Unstageable - Full thickness tissue loss in which the base of the injury is covered by slough (yellow, tan, gray, green or brown) and/or eschar (tan, brown or black) in the wound bed.  Date First Assessed/Time First Assessed: 09/27/20 1534   Location: Sacrum  Location Orientation: Staging: Unstageable - Full thickness tissue loss in which the base of the injury is covered by slough (yellow, tan, gray, green or brown) and/or ...  Dressing Type Moist to moist;Foam - Lift dressing to assess site every shift (Santyl)  Dressing Changed  Dressing Change Frequency Daily  State of Healing Non-healing  Site / Wound Assessment Yellow;Pink;Bleeding  % Wound base Red or Granulating 20%  % Wound base Yellow/Fibrinous Exudate 80%  Peri-wound Assessment Erythema (non-blanchable)  Wound Length (cm) 5.7 cm  Wound Width (cm) 3 cm  Wound Depth (cm) 1 cm  Wound Surface Area (cm^2) 17.1 cm^2  Wound Volume (cm^3) 17.1 cm^3  Tunneling (cm)  (3 cm @12 :00; 1 cm @9 :00)  Margins Unattached edges (unapproximated)  Drainage Amount Minimal  Drainage Description Odor (yellow-green colored discharge)  Treatment Debridement (Selective);Hydrotherapy (Pulse lavage);Packing (Saline gauze) (Santyl)  Wound / Incision (Open or Dehisced) 10/29/20 Diabetic ulcer Heel Left;Posterior Unstageable pressure injury  Date First Assessed/Time First Assessed: 10/29/20 2109   Wound Type:  Diabetic ulcer  Location: Heel  Location Orientation: Left;Posterior  Wound Description (Comments): Unstageable pressure injury  Present on Admission: Yes  Dressing Type Foam - Lift dressing to assess site every shift;Moist to moist (Santyl)  Dressing Changed Changed  Treatment Hydrotherapy (Pulse lavage) (very brief since area is so small. PT will not plan to continue hydrotherapy for L heel-not warranted.  Hydrotherapy  Pulsed lavage therapy - wound location primarily sacrum. very brief for L heel since area is so small  Pulsed Lavage with Suction (psi) 8 psi  Pulsed Lavage with Suction - Normal Saline Used 1000 mL  Pulsed Lavage Tip Tip with splash shield  Selective Debridement  Selective Debridement - Location sacrum  Selective Debridement - Tools Used Forceps;Scissors  Selective Debridement - Tissue Removed Brown tissue and yellow slough  Wound Therapy - Assess/Plan/Recommendations  Wound Therapy - Clinical Statement 59 yo female admitted from SNF with GI bleed. Hx of DM, R BKA, CKD, osteomyelitis, anemia  Factors Delaying/Impairing Wound Healing Diabetes Mellitus;Incontinence;Immobility  Hydrotherapy Plan Debridement;Dressing change;Patient/family education;Pulsatile lavage with suction  Wound Therapy - Frequency 6X / week  Wound Therapy - Current Recommendations Case manager/social work  Wound Therapy - Follow Up Recommendations Skilled nursing facility  Wound Plan Pulsed lavage, dressing changes 6x/week  Wound Therapy Goals - Improve the function of patient's integumentary system by progressing the wound(s) through the phases of wound healing by:  Decrease Necrotic Tissue to 20%  Decrease Necrotic Tissue - Progress Goal set today  Increase Granulation Tissue to 80%  Increase Granulation Tissue - Progress Goal set today  Goals/treatment plan/discharge plan were made with and agreed upon by patient/family Yes  Time For Goal Achievement  2 weeks  Wound Therapy - Potential for Goals  Rise Patience, PT Acute Rehabilitation  Office: [phone] Pager: [phone]

## 2020-11-11 NOTE — Progress Notes (Signed)
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 Apparently went to a skilled nursing facility on 11/12, patient reports eating hot chili and starting symptoms. Does not have history of GI ulcers. She is on chronic prednisone therapy. Does not use any NSAIDs. In the ER hemoglobin 9.8 which is fairly stable. Creatinine 1.8 which is at baseline. Started on Protonix, suspected upper GI bleeding or Mallory-Weiss tear and admitted to the hospital.   Assessment & Plan:   Principal Problem:   GI bleed Active Problems:   History of simultaneous kidney and pancreas transplant ([facility])   Chronic kidney disease (CKD) stage G3a/A1, moderately decreased glomerular filtration rate (GFR) between 45-59 mL/min/1.73 square meter and albuminuria creatinine ratio less than 30 mg/g (HCC)   Hypertension   Diabetes mellitus with hyperglycemia (HCC)   Pressure injury of skin   Long-term use of immunosuppressant medication   Coffee ground emesis   Gastroesophageal reflux disease with esophagitis and hemorrhage  Upper GI bleeding/coffee-ground emesis/intractable nausea and vomiting: Underwent upper GI endoscopy and she was found to have erosive esophagitis.  No bleeding ulcer.  On Protonix 40 mg twice a day. Hemoglobin dropped from 9-8.  Currently no indication for transfusion.  CKD stage IIIa: Known kidney and pancreas transplant. Her renal functions are historically at best.  Patient is on chronic  suppression therapy with low-dose prednisone, cyclosporine and azathioprine that we will continue as her renal functions are normal.  Type 2 diabetes on insulin: Well controlled.  On insulin.  Hypothyroidism: On Synthroid.  Sacral decubitus ulcer stage III, multiple left heel ulcerations: Multiple pressure ulcers present. Wound care. Wound care ordered hydrotherapy for her sacral wound, continue in the hospital.  On discharge she will have wound therapy.  Right BKA on 10/6: Stump healing well.  Staples removed.  Hypomagnesemia: Replaced and adequate.  Advanced physical debility: Patient is with debilitated and frail.  She will be able to go back to a SNF to continue rehab..   DVT prophylaxis: SCDs Start: 11/09/20 0145   Code Status: Full code Family Communication: Husband at the bedside 11/15. Disposition Plan: Status is: Inpatient  Remains inpatient appropriate because:Inpatient level of care appropriate due to severity of illness   Dispo: The patient is from: SNF              Anticipated d/c is to: SNF              Anticipated d/c date is: When bed available.              Patient currently is medically stable.     Consultants:   Gastroenterology  wound care  Procedures:   None  Antimicrobials:   None   Subjective: Seen and examined.  No overnight events.  Denies any nausea vomiting.  Able to eat well.  Pain is well controlled. She thinks she is gaining some weight due to retention of fluid.  Objective: Vitals:   11/10/20 1439 11/10/20 1600 11/10/20 2118 11/11/20 0534  BP: (!) 167/82 125/60 122/61 134/75  Pulse: 75 82 86 74  Resp: 20 18 16 18   Temp: (!) 97.4 F (36.3 C) 98.1 F (36.7 C) 98.9 F (37.2 C) 98 F (36.7 C)  TempSrc: Oral Oral Oral Oral  SpO2: 94% 94% 90% 91%  Weight:    58.1 kg  Height:        Intake/Output Summary (Last 24 hours) at 11/11/2020 1417 Last data filed at 11/11/2020 0536 Gross per 24 hour  Intake 11.49 ml  Output 500  ml  Net -488.51 ml   Filed Weights   11/09/20 1100 11/10/20 1222 11/11/20 0534  Weight: 55.8 kg 49.9 kg 58.1 kg    Examination:  General exam: Appears calm and comfortable  Chronically sick looking but currently comfortable. Patient has chronic bilateral chemosis of the eyes. Respiratory system: Clear to auscultation. Respiratory effort normal. Cardiovascular system: S1 & S2 heard, RRR.  Gastrointestinal system: Soft and nontender. Bowel sounds present.  Central nervous system: Alert and oriented. No focal neurological deficits. Generalized weakness. Right BKA stump with staples intact, flaky margins. Left heel and foot with multiple pressure ulcers. Unstageable ulcer sacrum.    Data Reviewed: I have personally reviewed following labs and imaging studies  CBC: Recent Labs  Lab 11/06/20 0043 11/07/20 0118 11/08/20 1843 11/09/20 0450 11/10/20 0910  WBC 5.9 5.1 5.5 5.4 4.7  NEUTROABS 4.0 3.3 4.1  --  3.0  HGB 8.7* 8.4* 9.8* 9.2* 8.3*  HCT 27.9* 27.0* 32.0* 29.0* 27.1*  MCV 106.1* 106.3* 108.8* 107.4* 110.2*  PLT 212 193 223 230 956   Basic Metabolic Panel: Recent Labs  Lab 11/05/20 0218 11/06/20 0043 11/07/20 0118 11/08/20 1843 11/10/20 0910  NA 138 137 137 138 139  K 4.9 5.1 5.0 4.8 3.7  CL 104 104 105 102 107  CO2 25 22 24  19* 24  GLUCOSE 262* 225* 183* 327* 111*  BUN 89* 91* 89* 78* 52*  CREATININE 2.21* 2.27* 2.25* 1.85* 1.26*  CALCIUM 9.4 9.3 9.2 9.7 9.0  MG  --   --   --   --  1.5*   GFR: Estimated Creatinine Clearance: 39.3 mL/min (A) (by C-G formula based on SCr of 1.26 mg/dL (H)). Liver Function Tests: Recent Labs  Lab 11/08/20 1843  AST 30  ALT 18  ALKPHOS 72  BILITOT 1.8*  PROT 5.9*  ALBUMIN 2.8*   Recent Labs  Lab 11/08/20 1843  LIPASE 34   No results for input(s): AMMONIA in the last 168 hours. Coagulation Profile: No results for input(s): INR, PROTIME in the last 168 hours. Cardiac Enzymes: No results for input(s): CKTOTAL,  CKMB, CKMBINDEX, TROPONINI in the last 168 hours. BNP (last 3 results) No results for input(s): PROBNP in the last 8760 hours. HbA1C: No results for input(s): HGBA1C in the last 72 hours. CBG: Recent Labs  Lab 11/10/20 1149 11/10/20 1758 11/11/20 0001 11/11/20 0532 11/11/20 1206  GLUCAP 114* 238* 213* 162* 129*   Lipid Profile: No results for input(s): CHOL, HDL, LDLCALC, TRIG, CHOLHDL, LDLDIRECT in the last 72 hours. Thyroid Function Tests: No results for input(s): TSH, T4TOTAL, FREET4, T3FREE, THYROIDAB in the last 72 hours. Anemia Panel: No results for input(s): VITAMINB12, FOLATE, FERRITIN, TIBC, IRON, RETICCTPCT in the last 72 hours. Sepsis Labs: No results for input(s): PROCALCITON, LATICACIDVEN in the last 168 hours.  Recent Results (from the past 240 hour(s))  SARS Coronavirus 2 by RT PCR (hospital order, performed in [facility] hospital lab) Nasopharyngeal Nasopharyngeal Swab     Status: None   Collection Time: 11/07/20 12:20 PM  Specimen: Nasopharyngeal Swab  Result Value Ref Range Status   SARS Coronavirus 2 NEGATIVE NEGATIVE Final    Comment: (NOTE) SARS-CoV-2 target nucleic acids are NOT DETECTED.  The SARS-CoV-2 RNA is generally detectable in upper and lower respiratory specimens during the acute phase of infection. The lowest concentration of SARS-CoV-2 viral copies this assay can detect is 250 copies / mL. A negative result does not preclude SARS-CoV-2 infection and should not be used as the sole basis for treatment or other patient management decisions.  A negative result may occur with improper specimen collection / handling, submission of specimen other than nasopharyngeal swab, presence of viral mutation(s) within the areas targeted by this assay, and inadequate number of viral copies (<250 copies / mL). A negative result must be combined with clinical observations, patient history, and epidemiological information.  Fact Sheet for Patients:     StrictlyIdeas.no  Fact Sheet for Healthcare Providers: BankingDealers.co.za  This test is not yet approved or  cleared by the [place] FDA and has been authorized for detection and/or diagnosis of SARS-CoV-2 by FDA under an Emergency Use Authorization (EUA).  This EUA will remain in effect (meaning this test can be used) for the duration of the COVID-19 declaration under Section 564(b)(1) of the Act, 21 U.S.C. section 360bbb-3(b)(1), unless the authorization is terminated or revoked sooner.  Performed at [facility], [place] [address], [place], [place] [address]   Respiratory Panel by RT PCR (Flu A&B, Covid) - Nasopharyngeal Swab     Status: None   Collection Time: 11/08/20  7:14 PM   Specimen: Nasopharyngeal Swab  Result Value Ref Range Status   SARS Coronavirus 2 by RT PCR NEGATIVE NEGATIVE Final    Comment: (NOTE) SARS-CoV-2 target nucleic acids are NOT DETECTED.  The SARS-CoV-2 RNA is generally detectable in upper respiratoy specimens during the acute phase of infection. The lowest concentration of SARS-CoV-2 viral copies this assay can detect is 131 copies/mL. A negative result does not preclude SARS-Cov-2 infection and should not be used as the sole basis for treatment or other patient management decisions. A negative result may occur with  improper specimen collection/handling, submission of specimen other than nasopharyngeal swab, presence of viral mutation(s) within the areas targeted by this assay, and inadequate number of viral copies (<131 copies/mL). A negative result must be combined with clinical observations, patient history, and epidemiological information. The expected result is Negative.  Fact Sheet for Patients:  PinkCheek.be  Fact Sheet for Healthcare Providers:  GravelBags.it  This test is no t yet approved or cleared by the [place]  FDA and  has been authorized for detection and/or diagnosis of SARS-CoV-2 by FDA under an Emergency Use Authorization (EUA). This EUA will remain  in effect (meaning this test can be used) for the duration of the COVID-19 declaration under Section 564(b)(1) of the Act, 21 U.S.C. section 360bbb-3(b)(1), unless the authorization is terminated or revoked sooner.     Influenza A by PCR NEGATIVE NEGATIVE Final   Influenza B by PCR NEGATIVE NEGATIVE Final    Comment: (NOTE) The Xpert Xpress SARS-CoV-2/FLU/RSV assay is intended as an aid in  the diagnosis of influenza from Nasopharyngeal swab specimens and  should not be used as a sole basis for treatment. Nasal washings and  aspirates are unacceptable for Xpert Xpress SARS-CoV-2/FLU/RSV  testing.  Fact Sheet for Patients: PinkCheek.be  Fact Sheet for Healthcare Providers: GravelBags.it  This test is not yet approved or cleared by the [place] FDA  and  has been authorized for detection and/or diagnosis of SARS-CoV-2 by  FDA under an Emergency Use Authorization (EUA). This EUA will remain  in effect (meaning this test can be used) for the duration of the  Covid-19 declaration under Section 564(b)(1) of the Act, 21  U.S.C. section 360bbb-3(b)(1), unless the authorization is  terminated or revoked. Performed at [facility], [place] [address] [place] [address], [place], [place] [address]   MRSA PCR Screening     Status: None   Collection Time: 11/09/20 11:54 AM   Specimen: Nasal Mucosa; Nasopharyngeal  Result Value Ref Range Status   MRSA by PCR NEGATIVE NEGATIVE Final    Comment:        The GeneXpert MRSA Assay (FDA approved for NASAL specimens only), is one component of a comprehensive MRSA colonization surveillance program. It is not intended to diagnose MRSA infection nor to guide or monitor treatment for MRSA infections. Performed at [facility], [place] [address], [place], [place] [address]          Radiology Studies: No results found.      Scheduled Meds: . azaTHIOprine  50 mg Oral Daily  . collagenase   Topical Daily  . cycloSPORINE  75 mg Oral BID  . insulin aspart  0-6 Units Subcutaneous Q6H  . insulin glargine  8 Units Subcutaneous QHS  . levothyroxine  50 mcg Oral Q0600  . metoprolol tartrate  12.5 mg Oral BID  . pantoprazole  40 mg Oral BID  . predniSONE  5 mg Oral Q breakfast  . sodium bicarbonate  650 mg Oral BID   Continuous Infusions:    LOS: 3 days    Time spent: 30 minutes    Barb Merino, MD [facility] Pager [phone]

## 2020-11-11 NOTE — TOC Progression Note (Addendum)
Transition of Care [facility]) - Progression Note    Patient Details  Name: [name] MRN: [id number] Date of Birth: July 29, 1961  [facility] of [facility]) CM/SW Contact  Tehillah Cipriani, Juliann Pulse, RN Phone Number: 11/11/2020, 10:01 AM  Clinical Narrative: Patient/spouse chose-[facility] for SNF-rep Loie aware to start auth.Once auth received,then order covid.    1. 1.7 mi [place] at [place], [place] [address] [phone] Overall rating Average 2. 2 mi [facility] at the [place], [place] [address] [phone] Overall rating Below average 3. 2.1 mi [place] at [place], [place] [address] [phone] Overall rating Much below average 4. 2.2 mi [place] [facility] [employer] [place], [place] [address] [phone] Overall rating Much above average 5. 2.2 mi [place], [place] [address] [phone] Overall rating Much below average 6. 2.7 [place], [place] [address] [phone] Overall rating Much above average 7. 3.2 mi [place] [address] [place], [place] [address] [phone] Overall rating Average 8. 3.3 mi [facility] [address] [place], [place] [address] [phone] Overall rating Much below average 9. 3.5 mi [facility] [place], [place] [address] [phone] Overall rating Below average 10. 4.3 [place], [place] [address] [phone] Overall rating Below average 11. 4.7 mi [facility] at [place], [place] [address] [phone] Overall rating Much above  average 12. 5.2 mi [facility] [address] [place], [place] [address] [phone] Overall rating Much above average 13. 6.2 mi [place], [place] [address] [phone] Overall rating Average 14. 8.1 [place], [place] [address] [phone] Overall rating Above average 15. 8.1 mi [facility] and [place], [place] [address] [phone] Overall rating Much below average 16. 9.7 mi [facility] [address] [place], [place] [address] [phone] Overall rating Average 17. 10 mi [facility] [address] [place], [place] [address] [phone] Overall rating Much above average 18. 11.4 mi [place] [facility] [address] [place], [place] [address] [phone] Overall rating Much above average 19. 13.5 [facility] [address] [place], [place] [address] [phone] Overall rating Much below average 20. 13.5 mi [facility] [address] [place], [place] [address] [phone] Overall rating Much below average 21. 14.5 [place] [address] [place], [place] [address] [phone] Overall rating Much below average 22. 14.9 mi The [facility] [address] [place], [place] [address] [phone] Overall rating Much below average 23. 15.1 mi [facility] [place], [place] [address] [phone] Overall rating Much below average 24. 15.3 mi [facility] and [facility] [place], [place] [address] [phone] Overall rating Below average 25. [facility] [place], [place] [address] [phone] Overall rating Much  above average 26. 16.2 [place] [address] [place],  [address] [phone]  Overall rating Much above average 27. 16.6 mi [place] [address] [place], [place] [address] [phone] Overall rating Below average 28. 17.2 mi [place], [place] [address] [phone] Overall rating Average 29. 03.5 [facility] [address] [place], [place] [address] [phone] Overall rating Much below average 30. 19.4 mi [address] at [employer] [facility], [place] [address] [phone] Overall rating Much above average 31. 20.6 mi [place] [address] [place], [place] [address] [phone] Overall rating Much below average 32. 20.7 [place] and [facility] [place], [place] [address] [phone] Overall rating Below average 33. 66.8 [facility] [address] [place], [place] [address] [phone] Overall rating Below average 34. 21.1 [place], [place] [address] [phone] Overall rating Much above average 35. [place] [address] [place], [place] [address] [phone] Overall rating Below average 36. 22.4 mi [address] [place], [place] [address] [phone] Overall rating Average 37. 22.7 mi [facility] [address] [place], [place] [address] [phone] Overall rating Above average 38. 22.8 [address] [place], [place] [address] [phone] Overall rating Much above average 39. 22.9 [place], [place] [address] [phone] Overall rating Much below average 40. [place], [place] [address] [phone] Overall rating Much below average 41. 24.4 mi [facility] [address] [place], [place] [address] [phone] Overall rating Below average 42. 24.6 [facility] [address] [place], [place] [address] [phone] Overall rating Much below average 43. 24.6 mi [place], [place] [address] [phone] Overall rating Below average 44. 24.9 mi [place], [place] [address] [phone] Overall rating Below average To explore and download nursing home data,visit the data catalog on CMS.gov      Barriers to Discharge: Continued Medical Work up  Expected Discharge Plan and Services                                                 Social Determinants of Health (SDOH) Interventions    Readmission Risk Interventions No flowsheet data found.

## 2020-11-11 NOTE — Anesthesia Postprocedure Evaluation (Signed)
Anesthesia Post Note  Patient: [name]  Procedure(s) Performed: ESOPHAGOGASTRODUODENOSCOPY (EGD) WITH PROPOFOL (N/A )     Patient location during evaluation: Endoscopy Anesthesia Type: MAC Level of consciousness: awake and alert Pain management: pain level controlled Vital Signs Assessment: post-procedure vital signs reviewed and stable Respiratory status: spontaneous breathing, nonlabored ventilation, respiratory function stable and patient connected to nasal cannula oxygen Cardiovascular status: blood pressure returned to baseline and stable Postop Assessment: no apparent nausea or vomiting Anesthetic complications: no   No complications documented.  Last Vitals:  Vitals:   11/10/20 2118 11/11/20 0534  BP: 122/61 134/75  Pulse: 86 74  Resp: 16 18  Temp: 37.2 C 36.7 C  SpO2: 90% 91%    Last Pain:  Vitals:   11/11/20 0912  TempSrc:   PainSc: 3                  Williard Keller L Carvin Almas

## 2020-11-12 ENCOUNTER — Encounter (HOSPITAL_COMMUNITY): Payer: Self-pay | Admitting: Internal Medicine

## 2020-11-12 DIAGNOSIS — Z20822 Contact with and (suspected) exposure to covid-19: Secondary | ICD-10-CM | POA: Diagnosis not present

## 2020-11-12 DIAGNOSIS — L89159 Pressure ulcer of sacral region, unspecified stage: Secondary | ICD-10-CM | POA: Diagnosis not present

## 2020-11-12 DIAGNOSIS — I5032 Chronic diastolic (congestive) heart failure: Secondary | ICD-10-CM | POA: Diagnosis not present

## 2020-11-12 DIAGNOSIS — R339 Retention of urine, unspecified: Secondary | ICD-10-CM | POA: Diagnosis present

## 2020-11-12 DIAGNOSIS — M6281 Muscle weakness (generalized): Secondary | ICD-10-CM | POA: Diagnosis not present

## 2020-11-12 DIAGNOSIS — T148XXA Other injury of unspecified body region, initial encounter: Secondary | ICD-10-CM | POA: Diagnosis not present

## 2020-11-12 DIAGNOSIS — L89154 Pressure ulcer of sacral region, stage 4: Secondary | ICD-10-CM | POA: Diagnosis not present

## 2020-11-12 DIAGNOSIS — Y835 Amputation of limb(s) as the cause of abnormal reaction of the patient, or of later complication, without mention of misadventure at the time of the procedure: Secondary | ICD-10-CM | POA: Diagnosis not present

## 2020-11-12 DIAGNOSIS — E8809 Other disorders of plasma-protein metabolism, not elsewhere classified: Secondary | ICD-10-CM | POA: Diagnosis not present

## 2020-11-12 DIAGNOSIS — L89892 Pressure ulcer of other site, stage 2: Secondary | ICD-10-CM | POA: Diagnosis not present

## 2020-11-12 DIAGNOSIS — D631 Anemia in chronic kidney disease: Secondary | ICD-10-CM | POA: Diagnosis not present

## 2020-11-12 DIAGNOSIS — J189 Pneumonia, unspecified organism: Secondary | ICD-10-CM | POA: Diagnosis not present

## 2020-11-12 DIAGNOSIS — Z79899 Other long term (current) drug therapy: Secondary | ICD-10-CM | POA: Diagnosis not present

## 2020-11-12 DIAGNOSIS — M4628 Osteomyelitis of vertebra, sacral and sacrococcygeal region: Secondary | ICD-10-CM | POA: Diagnosis not present

## 2020-11-12 DIAGNOSIS — M2550 Pain in unspecified joint: Secondary | ICD-10-CM | POA: Diagnosis not present

## 2020-11-12 DIAGNOSIS — R4182 Altered mental status, unspecified: Secondary | ICD-10-CM | POA: Diagnosis not present

## 2020-11-12 DIAGNOSIS — R9431 Abnormal electrocardiogram [ECG] [EKG]: Secondary | ICD-10-CM | POA: Diagnosis not present

## 2020-11-12 DIAGNOSIS — M86271 Subacute osteomyelitis, right ankle and foot: Secondary | ICD-10-CM | POA: Diagnosis not present

## 2020-11-12 DIAGNOSIS — I130 Hypertensive heart and chronic kidney disease with heart failure and stage 1 through stage 4 chronic kidney disease, or unspecified chronic kidney disease: Secondary | ICD-10-CM | POA: Diagnosis not present

## 2020-11-12 DIAGNOSIS — Z8249 Family history of ischemic heart disease and other diseases of the circulatory system: Secondary | ICD-10-CM | POA: Diagnosis not present

## 2020-11-12 DIAGNOSIS — S2231XA Fracture of one rib, right side, initial encounter for closed fracture: Secondary | ICD-10-CM | POA: Diagnosis not present

## 2020-11-12 DIAGNOSIS — E871 Hypo-osmolality and hyponatremia: Secondary | ICD-10-CM | POA: Diagnosis not present

## 2020-11-12 DIAGNOSIS — J811 Chronic pulmonary edema: Secondary | ICD-10-CM | POA: Diagnosis not present

## 2020-11-12 DIAGNOSIS — N139 Obstructive and reflux uropathy, unspecified: Secondary | ICD-10-CM | POA: Diagnosis not present

## 2020-11-12 DIAGNOSIS — T879 Unspecified complications of amputation stump: Secondary | ICD-10-CM | POA: Diagnosis not present

## 2020-11-12 DIAGNOSIS — Z9641 Presence of insulin pump (external) (internal): Secondary | ICD-10-CM | POA: Diagnosis not present

## 2020-11-12 DIAGNOSIS — T8789 Other complications of amputation stump: Secondary | ICD-10-CM | POA: Diagnosis not present

## 2020-11-12 DIAGNOSIS — K920 Hematemesis: Secondary | ICD-10-CM | POA: Diagnosis not present

## 2020-11-12 DIAGNOSIS — L89899 Pressure ulcer of other site, unspecified stage: Secondary | ICD-10-CM | POA: Diagnosis not present

## 2020-11-12 DIAGNOSIS — Z794 Long term (current) use of insulin: Secondary | ICD-10-CM | POA: Diagnosis not present

## 2020-11-12 DIAGNOSIS — E43 Unspecified severe protein-calorie malnutrition: Secondary | ICD-10-CM | POA: Diagnosis not present

## 2020-11-12 DIAGNOSIS — E1110 Type 2 diabetes mellitus with ketoacidosis without coma: Secondary | ICD-10-CM | POA: Diagnosis not present

## 2020-11-12 DIAGNOSIS — Z7189 Other specified counseling: Secondary | ICD-10-CM | POA: Diagnosis not present

## 2020-11-12 DIAGNOSIS — Z89431 Acquired absence of right foot: Secondary | ICD-10-CM | POA: Diagnosis not present

## 2020-11-12 DIAGNOSIS — L98499 Non-pressure chronic ulcer of skin of other sites with unspecified severity: Secondary | ICD-10-CM | POA: Diagnosis not present

## 2020-11-12 DIAGNOSIS — N179 Acute kidney failure, unspecified: Secondary | ICD-10-CM | POA: Diagnosis not present

## 2020-11-12 DIAGNOSIS — R404 Transient alteration of awareness: Secondary | ICD-10-CM | POA: Diagnosis not present

## 2020-11-12 DIAGNOSIS — L89153 Pressure ulcer of sacral region, stage 3: Secondary | ICD-10-CM | POA: Diagnosis not present

## 2020-11-12 DIAGNOSIS — L0390 Cellulitis, unspecified: Secondary | ICD-10-CM | POA: Diagnosis not present

## 2020-11-12 DIAGNOSIS — R338 Other retention of urine: Secondary | ICD-10-CM | POA: Diagnosis not present

## 2020-11-12 DIAGNOSIS — Z515 Encounter for palliative care: Secondary | ICD-10-CM | POA: Diagnosis not present

## 2020-11-12 DIAGNOSIS — F32A Depression, unspecified: Secondary | ICD-10-CM | POA: Diagnosis not present

## 2020-11-12 DIAGNOSIS — E1022 Type 1 diabetes mellitus with diabetic chronic kidney disease: Secondary | ICD-10-CM | POA: Diagnosis not present

## 2020-11-12 DIAGNOSIS — E1165 Type 2 diabetes mellitus with hyperglycemia: Secondary | ICD-10-CM | POA: Diagnosis not present

## 2020-11-12 DIAGNOSIS — I959 Hypotension, unspecified: Secondary | ICD-10-CM | POA: Diagnosis not present

## 2020-11-12 DIAGNOSIS — J690 Pneumonitis due to inhalation of food and vomit: Secondary | ICD-10-CM | POA: Diagnosis not present

## 2020-11-12 DIAGNOSIS — I739 Peripheral vascular disease, unspecified: Secondary | ICD-10-CM | POA: Diagnosis not present

## 2020-11-12 DIAGNOSIS — N189 Chronic kidney disease, unspecified: Secondary | ICD-10-CM | POA: Diagnosis not present

## 2020-11-12 DIAGNOSIS — E878 Other disorders of electrolyte and fluid balance, not elsewhere classified: Secondary | ICD-10-CM | POA: Diagnosis present

## 2020-11-12 DIAGNOSIS — N3289 Other specified disorders of bladder: Secondary | ICD-10-CM | POA: Diagnosis not present

## 2020-11-12 DIAGNOSIS — A419 Sepsis, unspecified organism: Secondary | ICD-10-CM | POA: Diagnosis not present

## 2020-11-12 DIAGNOSIS — L03312 Cellulitis of back [any part except buttock]: Secondary | ICD-10-CM | POA: Diagnosis not present

## 2020-11-12 DIAGNOSIS — N2889 Other specified disorders of kidney and ureter: Secondary | ICD-10-CM | POA: Diagnosis not present

## 2020-11-12 DIAGNOSIS — R41841 Cognitive communication deficit: Secondary | ICD-10-CM | POA: Diagnosis not present

## 2020-11-12 DIAGNOSIS — G9341 Metabolic encephalopathy: Secondary | ICD-10-CM | POA: Diagnosis not present

## 2020-11-12 DIAGNOSIS — R531 Weakness: Secondary | ICD-10-CM | POA: Diagnosis not present

## 2020-11-12 DIAGNOSIS — L03115 Cellulitis of right lower limb: Secondary | ICD-10-CM | POA: Diagnosis not present

## 2020-11-12 DIAGNOSIS — E039 Hypothyroidism, unspecified: Secondary | ICD-10-CM | POA: Diagnosis present

## 2020-11-12 DIAGNOSIS — N1831 Chronic kidney disease, stage 3a: Secondary | ICD-10-CM | POA: Diagnosis not present

## 2020-11-12 DIAGNOSIS — I10 Essential (primary) hypertension: Secondary | ICD-10-CM | POA: Diagnosis not present

## 2020-11-12 DIAGNOSIS — Z79890 Hormone replacement therapy (postmenopausal): Secondary | ICD-10-CM | POA: Diagnosis not present

## 2020-11-12 DIAGNOSIS — E10621 Type 1 diabetes mellitus with foot ulcer: Secondary | ICD-10-CM | POA: Diagnosis not present

## 2020-11-12 DIAGNOSIS — Z7951 Long term (current) use of inhaled steroids: Secondary | ICD-10-CM | POA: Diagnosis not present

## 2020-11-12 DIAGNOSIS — K2901 Acute gastritis with bleeding: Secondary | ICD-10-CM | POA: Diagnosis not present

## 2020-11-12 DIAGNOSIS — N183 Chronic kidney disease, stage 3 unspecified: Secondary | ICD-10-CM | POA: Diagnosis not present

## 2020-11-12 DIAGNOSIS — Z4781 Encounter for orthopedic aftercare following surgical amputation: Secondary | ICD-10-CM | POA: Diagnosis not present

## 2020-11-12 DIAGNOSIS — Y830 Surgical operation with transplant of whole organ as the cause of abnormal reaction of the patient, or of later complication, without mention of misadventure at the time of the procedure: Secondary | ICD-10-CM | POA: Diagnosis present

## 2020-11-12 DIAGNOSIS — R627 Adult failure to thrive: Secondary | ICD-10-CM | POA: Diagnosis not present

## 2020-11-12 DIAGNOSIS — Z7401 Bed confinement status: Secondary | ICD-10-CM | POA: Diagnosis not present

## 2020-11-12 DIAGNOSIS — T8619 Other complication of kidney transplant: Secondary | ICD-10-CM | POA: Diagnosis not present

## 2020-11-12 DIAGNOSIS — E440 Moderate protein-calorie malnutrition: Secondary | ICD-10-CM | POA: Diagnosis not present

## 2020-11-12 DIAGNOSIS — Z9483 Pancreas transplant status: Secondary | ICD-10-CM | POA: Diagnosis not present

## 2020-11-12 DIAGNOSIS — E108 Type 1 diabetes mellitus with unspecified complications: Secondary | ICD-10-CM | POA: Diagnosis not present

## 2020-11-12 DIAGNOSIS — K2210 Ulcer of esophagus without bleeding: Secondary | ICD-10-CM | POA: Diagnosis not present

## 2020-11-12 DIAGNOSIS — Z940 Kidney transplant status: Secondary | ICD-10-CM | POA: Diagnosis not present

## 2020-11-12 DIAGNOSIS — I9589 Other hypotension: Secondary | ICD-10-CM | POA: Diagnosis not present

## 2020-11-12 DIAGNOSIS — E11622 Type 2 diabetes mellitus with other skin ulcer: Secondary | ICD-10-CM | POA: Diagnosis not present

## 2020-11-12 LAB — GLUCOSE, CAPILLARY
Glucose-Capillary: 161 mg/dL — ABNORMAL HIGH (ref 70–99)
Glucose-Capillary: 213 mg/dL — ABNORMAL HIGH (ref 70–99)
Glucose-Capillary: 291 mg/dL — ABNORMAL HIGH (ref 70–99)

## 2020-11-12 MED ORDER — TORSEMIDE 20 MG PO TABS: 20.0000 mg | ORAL_TABLET | Freq: Every day | ORAL | Status: DC

## 2020-11-12 MED ORDER — PANTOPRAZOLE SODIUM 40 MG PO TBEC
40.0000 mg | DELAYED_RELEASE_TABLET | Freq: Two times a day (BID) | ORAL | Status: DC
Start: 2020-11-12 — End: 2021-03-02

## 2020-11-12 MED ORDER — TORSEMIDE 20 MG PO TABS
20.0000 mg | ORAL_TABLET | Freq: Every day | ORAL | Status: DC
  Administered 2020-11-12: 20 mg via ORAL
  Filled 2020-11-12: qty 1

## 2020-11-12 NOTE — TOC Progression Note (Signed)
Transition of Care [facility]) - Progression Note    Patient Details  Name: [name] MRN: [id number] Date of Birth: 04-30-1961  Transition [facility] CM/SW Contact  [name], Doris Pulse, RN Phone Number: 11/12/2020, 1:35 PM  Clinical Narrative: Received call from [name] received auth for SNF-going to rm#[id number], nsg call report tel#[phone], no new covid test needed since last covid was 11/13 neg. CM left vm w/[name](spouse) informing of d/c to [facility] today.MD aware to do d/c summary. [name] CM will arrange transport by [facility].        Barriers to Discharge: No Barriers Identified  Expected Discharge Plan and Services                                                 Social Determinants of Health (SDOH) Interventions    Readmission Risk Interventions No flowsheet data found.

## 2020-11-12 NOTE — TOC Progression Note (Signed)
Transition of Care [facility]) - Progression Note    Patient Details  Name: [name] MRN: [id number] Date of Birth: [birth year]  [facility]) CM/SW Contact  Hendrix Console, Juliann Pulse, RN Phone Number: 11/12/2020, 11:23 AM  Clinical Narrative:[facility] SNF rep [name] has started auth from yesterday-spoke to her today-still pending auth. Rapid covid to be ordered once we have auth.Noted for transfer-Case manager to receive updated.          Barriers to Discharge: Ship broker  Expected Discharge Plan and Services                                                 Social Determinants of Health (SDOH) Interventions    Readmission Risk Interventions No flowsheet data found.

## 2020-11-12 NOTE — Discharge Summary (Signed)
Physician Discharge Summary  [name] KKX:[id number] DOB: 02/11/1961 DOA: 11/08/2020  PCP: [name], MD  Admit date: 11/08/2020 Discharge date: 11/12/2020  Admitted From: Skilled nursing facility Disposition: Skilled nursing facility  Recommendations for Outpatient Follow-up:  1. Follow up with PCP in 1-2 weeks 2. Please obtain BMP/CBC every week and report it to her nephrologist. 3. Please follow-up with your nephrologist 4. Wound care as ordered.  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 Apparently went to a skilled nursing facility on 11/12, patient reports eating hot chili and starting symptoms. Does not have history of GI ulcers. She is on chronic prednisone therapy. Does not use any NSAIDs. In the ER hemoglobin 9.8 which is fairly stable. Creatinine 1.8 which is at baseline. Started on Protonix, suspected upper GI bleeding or Mallory-Weiss tear and admitted to the hospital. Underwent upper GI endoscopy and found to have severe esophagitis but no active bleeding ulcer.  Treated for following conditions.   Assessment & Plan of care:   Upper GI bleeding/coffee-ground emesis/intractable nausea and vomiting: Underwent upper GI endoscopy and she was found to have erosive esophagitis.  No bleeding ulcer.  On Protonix 40 mg twice a day. Hemoglobin dropped from 9-8.  Currently no indication for  transfusion. Continue Protonix 40 mg twice daily.  Acute kidney injury on CKD stage IIIa: Known kidney and pancreas transplant. Her renal functions are historically at best.  Patient is on chronic suppression therapy with low-dose prednisone, cyclosporine and azathioprine that we will continue as her renal functions are normal. Patient does have edema of her thigh and groin, will start patient on torsemide 20 mg daily. She may need further diuretics for symptomatic improvement. Please schedule follow-up with her nephrologist, Dr. Graylon Gunning with [facility].  Type 2 diabetes on insulin: Well controlled.  On insulin.  Hypothyroidism: On Synthroid.  Euthyroid.  Sacral decubitus ulcer stage III, multiple left heel ulcerations: Multiple pressure ulcers present. Wound care. Wound care instructions in place.  Right BKA on 10/6: Stump healing well.  Staples removed.  Hypomagnesemia: Replaced and adequate.  Advanced physical debility: Patient is with debilitated and frail.  She will be able to go back to a SNF to continue rehab.   Discharge Diagnoses:  Principal Problem:   GI bleed Active Problems:   History of simultaneous kidney and pancreas transplant ([place])   Chronic kidney disease (CKD) stage G3a/A1, moderately decreased glomerular filtration rate (GFR) between 45-59 mL/min/1.73 square meter and albuminuria creatinine ratio less than 30 mg/g (HCC)   Hypertension   Diabetes mellitus with hyperglycemia (HCC)   Pressure injury of skin   Long-term use of immunosuppressant medication   Coffee ground emesis   Gastroesophageal reflux disease with esophagitis and hemorrhage    Discharge Instructions  Discharge Instructions    Diet - low sodium heart healthy   Complete by: As directed    Diet Carb Modified   Complete by: As directed    Discharge wound care:   Complete by: As directed    Cleanse wounds to left heel  and sacrum with NS and pat dry.  Apply Santyl to wound bed.  Cover with NS moist gauze. Secure with foam dressing  Change daily   Increase activity slowly   Complete by: As directed      Allergies as of 11/12/2020   No Known Allergies     Medication List    TAKE these medications   acetaminophen 325 MG tablet Commonly known as: TYLENOL Take 2 tablets (650 mg total) by mouth every 6 (six) hours as needed for mild pain (or Fever >/= 101). What changed: reasons to take this   Admelog 100 UNIT/ML injection Generic drug: insulin lispro Inject 2 Units into the skin 3 (three) times daily before meals.   azaTHIOprine 50 MG tablet Commonly known as: IMURAN Take 50 mg by mouth daily.   collagenase ointment Commonly known as: SANTYL Apply topically daily.   cycloSPORINE 25 MG capsule Commonly known as: SANDIMMUNE Take 75 mg by mouth 2 (two) times daily.   Gerhardt's butt cream Crea Apply 1 application topically 2 (two) times daily.   HYDROcodone-acetaminophen 5-325 MG tablet Commonly known as: NORCO/VICODIN Take 1 tablet by mouth every 6 (six) hours as needed for up to 5 days for moderate pain. What changed: when to take this   insulin glargine 100 UNIT/ML injection Commonly known as: LANTUS Inject 0.08 mLs (8 Units total) into the skin 2 (two) times daily.   levothyroxine 50 MCG tablet Commonly known as: SYNTHROID Take 50 mcg by mouth daily.   metoprolol tartrate 25 MG tablet Commonly known as: LOPRESSOR Take 12.5 mg by mouth 2 (two) times daily.   nutrition supplement (JUVEN) Pack Take 1 packet by mouth 2 (two) times daily between meals.   pantoprazole 40 MG tablet Commonly known as: PROTONIX Take 1 tablet (40 mg total) by mouth 2 (two) times daily.   predniSONE 5 MG tablet Commonly known as: DELTASONE Take 5 mg by mouth daily.   rosuvastatin 5 MG tablet Commonly known as: CRESTOR Take 1 tablet (5 mg total) by mouth at bedtime.   sertraline 25 MG tablet Commonly known as: ZOLOFT Take 1 tablet (25 mg total) by mouth  daily.   sodium bicarbonate 650 MG tablet Take 1 tablet (650 mg total) by mouth 2 (two) times daily.   torsemide 20 MG tablet Commonly known as: DEMADEX Take 1 tablet (20 mg total) by mouth daily. Start taking on: November 13, 2020            Discharge Care Instructions  (From admission, onward)         Start     Ordered   11/12/20 0000  Discharge wound care:       Comments: Cleanse wounds to left heel and sacrum with NS and pat dry.  Apply Santyl to wound bed. Cover with NS moist gauze. Secure with foam dressing  Change daily   11/12/20 1337          Contact information for follow-up providers    [name], MD Follow up in 2 week(s).   Specialty: Family Medicine Contact information: [place] [phone]            Contact information for after-discharge care    Destination    [facility] .   Service: Skilled Chiropodist information: [address] [place] [phone]  No Known Allergies  Consultations:  Gastroenterology   Procedures/Studies: US Renal Transplant w/Doppler  Result Date: 10/29/2020 CLINICAL DATA:  Acute renal insufficiency, renal transplant EXAM: ULTRASOUND OF RENAL TRANSPLANT WITH RENAL DOPPLER ULTRASOUND TECHNIQUE: Ultrasound examination of the renal transplant was performed with gray-scale, color and duplex doppler evaluation. COMPARISON:  09/27/2020 FINDINGS: Transplant kidney location: RLQ Transplant Kidney: Renal measurements: 11.7 x 5.5 x 7.3 cm = volume: 242.78mL. Echotexture is increased consistent with medical renal disease. There is mild hydronephrosis. No nephrolithiasis or renal mass. Color flow in the main renal artery:  Yes Color flow in the main renal vein:  Yes Duplex Doppler Evaluation: Main Renal Artery Velocity: 38.4 cm/sec Main Renal Artery Resistive Index: 0.71 Venous waveform in main renal  vein:  Present Intrarenal resistive index in upper pole:  0.76 (normal 0.6-0.8; equivocal 0.8-0.9; abnormal >= 0.9) Intrarenal resistive index in lower pole: 0.69 (normal 0.6-0.8; equivocal 0.8-0.9; abnormal >= 0.9) Bladder: Bladder is moderately distended, with debris layering dependently. Other findings:  Trace free fluid in the pelvis. IMPRESSION: 1. Increased cortical echotexture within the right lower quadrant transplant kidney, consistent with medical renal disease. 2. Mild hydronephrosis of the right lower quadrant transplant kidney. 3. Normal Doppler interrogation of the transplant kidney. 4. Debris layering dependently within the bladder. Electronically Signed   By: Randa Ngo M.D.   On: 10/29/2020 15:46    (Echo, Carotid, EGD, Colonoscopy, ERCP)    Subjective: Patient was seen and examined.  No overnight events.  She is more worried about gaining weight and edema of her left upper thigh and lower abdomen. Her renal functions has improved and we will start her on some diuretics. She is excited to go to rehab place.   Discharge Exam: Vitals:   11/12/20 1239 11/12/20 1331  BP: (!) 146/70 (!) 149/78  Pulse: 75 75  Resp: 20 18  Temp: 98.4 F (36.9 C) 98.4 F (36.9 C)  SpO2: 98% 97%   Vitals:   11/12/20 0548 11/12/20 1052 11/12/20 1239 11/12/20 1331  BP: (!) 152/84 (!) 178/81 (!) 146/70 (!) 149/78  Pulse: 76 84 75 75  Resp: 18  20 18   Temp: 98 F (36.7 C)  98.4 F (36.9 C) 98.4 F (36.9 C)  TempSrc: Oral  Oral Oral  SpO2: 94% 98% 98% 97%  Weight: 60.5 kg   58.8 kg  Height:    5\' 3"  (1.6 m)    General: Pt is alert, awake, not in acute distress Chronically sick looking and very debilitated lady. Cardiovascular: RRR, S1/S2 +, no rubs, no gallops Respiratory: CTA bilaterally, no wheezing, no rhonchi Abdominal: Soft, NT, ND, bowel sounds + Extremities:  Patient has 2+ pedal edema, tense swelling on the left upper thigh. Multiple decubitus ulcers left heel as  above. Right below-knee amputation stump clean and dry.    The results of significant diagnostics from this hospitalization (including imaging, microbiology, ancillary and laboratory) are listed below for reference.     Microbiology: Recent Results (from the past 240 hour(s))  SARS Coronavirus 2 by RT PCR (hospital order, performed in [facility] hospital lab) Nasopharyngeal Nasopharyngeal Swab     Status: None   Collection Time: 11/07/20 12:20 PM   Specimen: Nasopharyngeal Swab  Result Value Ref Range Status   SARS Coronavirus 2 NEGATIVE NEGATIVE Final    Comment: (NOTE) SARS-CoV-2 target nucleic acids are NOT DETECTED.  The SARS-CoV-2 RNA is generally detectable in upper and lower respiratory specimens during the acute phase of infection. The lowest concentration  of SARS-CoV-2 viral copies this assay can detect is 250 copies / mL. A negative result does not preclude SARS-CoV-2 infection and should not be used as the sole basis for treatment or other patient management decisions.  A negative result may occur with improper specimen collection / handling, submission of specimen other than nasopharyngeal swab, presence of viral mutation(s) within the areas targeted by this assay, and inadequate number of viral copies (<250 copies / mL). A negative result must be combined with clinical observations, patient history, and epidemiological information.  Fact Sheet for Patients:   StrictlyIdeas.no  Fact Sheet for Healthcare Providers: BankingDealers.co.za  This test is not yet approved or  cleared by the [place] FDA and has been authorized for detection and/or diagnosis of SARS-CoV-2 by FDA under an Emergency Use Authorization (EUA).  This EUA will remain in effect (meaning this test can be used) for the duration of the COVID-19 declaration under Section 564(b)(1) of the Act, 21 U.S.C. section 360bbb-3(b)(1), unless the  authorization is terminated or revoked sooner.  Performed at [facility], [place] [address], [place], [place] [address]   Respiratory Panel by RT PCR (Flu A&B, Covid) - Nasopharyngeal Swab     Status: None   Collection Time: 11/08/20  7:14 PM   Specimen: Nasopharyngeal Swab  Result Value Ref Range Status   SARS Coronavirus 2 by RT PCR NEGATIVE NEGATIVE Final    Comment: (NOTE) SARS-CoV-2 target nucleic acids are NOT DETECTED.  The SARS-CoV-2 RNA is generally detectable in upper respiratoy specimens during the acute phase of infection. The lowest concentration of SARS-CoV-2 viral copies this assay can detect is 131 copies/mL. A negative result does not preclude SARS-Cov-2 infection and should not be used as the sole basis for treatment or other patient management decisions. A negative result may occur with  improper specimen collection/handling, submission of specimen other than nasopharyngeal swab, presence of viral mutation(s) within the areas targeted by this assay, and inadequate number of viral copies (<131 copies/mL). A negative result must be combined with clinical observations, patient history, and epidemiological information. The expected result is Negative.  Fact Sheet for Patients:  PinkCheek.be  Fact Sheet for Healthcare Providers:  GravelBags.it  This test is no t yet approved or cleared by the [place] FDA and  has been authorized for detection and/or diagnosis of SARS-CoV-2 by FDA under an Emergency Use Authorization (EUA). This EUA will remain  in effect (meaning this test can be used) for the duration of the COVID-19 declaration under Section 564(b)(1) of the Act, 21 U.S.C. section 360bbb-3(b)(1), unless the authorization is terminated or revoked sooner.     Influenza A by PCR NEGATIVE NEGATIVE Final   Influenza B by PCR NEGATIVE NEGATIVE Final    Comment: (NOTE) The Xpert Xpress  SARS-CoV-2/FLU/RSV assay is intended as an aid in  the diagnosis of influenza from Nasopharyngeal swab specimens and  should not be used as a sole basis for treatment. Nasal washings and  aspirates are unacceptable for Xpert Xpress SARS-CoV-2/FLU/RSV  testing.  Fact Sheet for Patients: PinkCheek.be  Fact Sheet for Healthcare Providers: GravelBags.it  This test is not yet approved or cleared by the [place] FDA and  has been authorized for detection and/or diagnosis of SARS-CoV-2 by  FDA under an Emergency Use Authorization (EUA). This EUA will remain  in effect (meaning this test can be used) for the duration of the  Covid-19 declaration under Section 564(b)(1) of the Act, 21  U.S.C. section 360bbb-3(b)(1),  unless the authorization is  terminated or revoked. Performed at [facility], [place] [address], [place], [place] [address]   MRSA PCR Screening     Status: None   Collection Time: 11/09/20 11:54 AM   Specimen: Nasal Mucosa; Nasopharyngeal  Result Value Ref Range Status   MRSA by PCR NEGATIVE NEGATIVE Final    Comment:        The GeneXpert MRSA Assay (FDA approved for NASAL specimens only), is one component of a comprehensive MRSA colonization surveillance program. It is not intended to diagnose MRSA infection nor to guide or monitor treatment for MRSA infections. Performed at [facility], [place] [address] [place], [place] [address]      Labs: BNP (last 3 results) No results for input(s): BNP in the last 8760 hours. Basic Metabolic Panel: Recent Labs  Lab 11/06/20 0043 11/07/20 0118 11/08/20 1843 11/10/20 0910  NA 137 137 138 139  K 5.1 5.0 4.8 3.7  CL 104 105 102 107  CO2 22 24 19* 24  GLUCOSE 225* 183* 327* 111*  BUN 91* 89* 78* 52*  CREATININE 2.27* 2.25* 1.85* 1.26*  CALCIUM 9.3 9.2 9.7 9.0  MG  --   --   --  1.5*   Liver Function Tests: Recent Labs   Lab 11/08/20 1843  AST 30  ALT 18  ALKPHOS 72  BILITOT 1.8*  PROT 5.9*  ALBUMIN 2.8*   Recent Labs  Lab 11/08/20 1843  LIPASE 34   No results for input(s): AMMONIA in the last 168 hours. CBC: Recent Labs  Lab 11/06/20 0043 11/07/20 0118 11/08/20 1843 11/09/20 0450 11/10/20 0910  WBC 5.9 5.1 5.5 5.4 4.7  NEUTROABS 4.0 3.3 4.1  --  3.0  HGB 8.7* 8.4* 9.8* 9.2* 8.3*  HCT 27.9* 27.0* 32.0* 29.0* 27.1*  MCV 106.1* 106.3* 108.8* 107.4* 110.2*  PLT 212 193 223 230 213   Cardiac Enzymes: No results for input(s): CKTOTAL, CKMB, CKMBINDEX, TROPONINI in the last 168 hours. BNP: Invalid input(s): POCBNP CBG: Recent Labs  Lab 11/11/20 1649 11/11/20 2223 11/11/20 2310 11/12/20 0654 11/12/20 1134  GLUCAP 139* 273* 283* 161* 213*   D-Dimer No results for input(s): DDIMER in the last 72 hours. Hgb A1c No results for input(s): HGBA1C in the last 72 hours. Lipid Profile No results for input(s): CHOL, HDL, LDLCALC, TRIG, CHOLHDL, LDLDIRECT in the last 72 hours. Thyroid function studies No results for input(s): TSH, T4TOTAL, T3FREE, THYROIDAB in the last 72 hours.  Invalid input(s): FREET3 Anemia work up No results for input(s): VITAMINB12, FOLATE, FERRITIN, TIBC, IRON, RETICCTPCT in the last 72 hours. Urinalysis    Component Value Date/Time   COLORURINE YELLOW 11/08/2020 1843   APPEARANCEUR HAZY (A) 11/08/2020 1843   LABSPEC 1.012 11/08/2020 1843   PHURINE 6.0 11/08/2020 1843   GLUCOSEU >=500 (A) 11/08/2020 1843   HGBUR MODERATE (A) 11/08/2020 1843   BILIRUBINUR NEGATIVE 11/08/2020 1843   KETONESUR 20 (A) 11/08/2020 1843   PROTEINUR 30 (A) 11/08/2020 1843   NITRITE NEGATIVE 11/08/2020 1843   LEUKOCYTESUR LARGE (A) 11/08/2020 1843   Sepsis Labs Invalid input(s): PROCALCITONIN,  WBC,  LACTICIDVEN Microbiology Recent Results (from the past 240 hour(s))  SARS Coronavirus 2 by RT PCR (hospital order, performed in [facility] hospital lab) Nasopharyngeal  Nasopharyngeal Swab     Status: None   Collection Time: 11/07/20 12:20 PM   Specimen: Nasopharyngeal Swab  Result Value Ref Range Status   SARS Coronavirus 2 NEGATIVE NEGATIVE Final  Comment: (NOTE) SARS-CoV-2 target nucleic acids are NOT DETECTED.  The SARS-CoV-2 RNA is generally detectable in upper and lower respiratory specimens during the acute phase of infection. The lowest concentration of SARS-CoV-2 viral copies this assay can detect is 250 copies / mL. A negative result does not preclude SARS-CoV-2 infection and should not be used as the sole basis for treatment or other patient management decisions.  A negative result may occur with improper specimen collection / handling, submission of specimen other than nasopharyngeal swab, presence of viral mutation(s) within the areas targeted by this assay, and inadequate number of viral copies (<250 copies / mL). A negative result must be combined with clinical observations, patient history, and epidemiological information.  Fact Sheet for Patients:   StrictlyIdeas.no  Fact Sheet for Healthcare Providers: BankingDealers.co.za  This test is not yet approved or  cleared by the [place] FDA and has been authorized for detection and/or diagnosis of SARS-CoV-2 by FDA under an Emergency Use Authorization (EUA).  This EUA will remain in effect (meaning this test can be used) for the duration of the COVID-19 declaration under Section 564(b)(1) of the Act, 21 U.S.C. section 360bbb-3(b)(1), unless the authorization is terminated or revoked sooner.  Performed at [facility], [place] [address] [place], [place] [address]   Respiratory Panel by RT PCR (Flu A&B, Covid) - Nasopharyngeal Swab     Status: None   Collection Time: 11/08/20  7:14 PM   Specimen: Nasopharyngeal Swab  Result Value Ref Range Status   SARS Coronavirus 2 by RT PCR NEGATIVE NEGATIVE Final    Comment:  (NOTE) SARS-CoV-2 target nucleic acids are NOT DETECTED.  The SARS-CoV-2 RNA is generally detectable in upper respiratoy specimens during the acute phase of infection. The lowest concentration of SARS-CoV-2 viral copies this assay can detect is 131 copies/mL. A negative result does not preclude SARS-Cov-2 infection and should not be used as the sole basis for treatment or other patient management decisions. A negative result may occur with  improper specimen collection/handling, submission of specimen other than nasopharyngeal swab, presence of viral mutation(s) within the areas targeted by this assay, and inadequate number of viral copies (<131 copies/mL). A negative result must be combined with clinical observations, patient history, and epidemiological information. The expected result is Negative.  Fact Sheet for Patients:  PinkCheek.be  Fact Sheet for Healthcare Providers:  GravelBags.it  This test is no t yet approved or cleared by the [place] FDA and  has been authorized for detection and/or diagnosis of SARS-CoV-2 by FDA under an Emergency Use Authorization (EUA). This EUA will remain  in effect (meaning this test can be used) for the duration of the COVID-19 declaration under Section 564(b)(1) of the Act, 21 U.S.C. section 360bbb-3(b)(1), unless the authorization is terminated or revoked sooner.     Influenza A by PCR NEGATIVE NEGATIVE Final   Influenza B by PCR NEGATIVE NEGATIVE Final    Comment: (NOTE) The Xpert Xpress SARS-CoV-2/FLU/RSV assay is intended as an aid in  the diagnosis of influenza from Nasopharyngeal swab specimens and  should not be used as a sole basis for treatment. Nasal washings and  aspirates are unacceptable for Xpert Xpress SARS-CoV-2/FLU/RSV  testing.  Fact Sheet for Patients: PinkCheek.be  Fact Sheet for Healthcare  Providers: GravelBags.it  This test is not yet approved or cleared by the [place] FDA and  has been authorized for detection and/or diagnosis of SARS-CoV-2 by  FDA under an Emergency Use Authorization (EUA). This  EUA will remain  in effect (meaning this test can be used) for the duration of the  Covid-19 declaration under Section 564(b)(1) of the Act, 21  U.S.C. section 360bbb-3(b)(1), unless the authorization is  terminated or revoked. Performed at [facility], [place] [address] [place], [place] [address]   MRSA PCR Screening     Status: None   Collection Time: 11/09/20 11:54 AM   Specimen: Nasal Mucosa; Nasopharyngeal  Result Value Ref Range Status   MRSA by PCR NEGATIVE NEGATIVE Final    Comment:        The GeneXpert MRSA Assay (FDA approved for NASAL specimens only), is one component of a comprehensive MRSA colonization surveillance program. It is not intended to diagnose MRSA infection nor to guide or monitor treatment for MRSA infections. Performed at [facility], [place] [address] [place], [place] [address]      Time coordinating discharge:  40 minutes  SIGNED:   Barb Merino, MD  [facility] 11/12/2020, 1:38 PM

## 2020-11-12 NOTE — TOC Transition Note (Signed)
Transition of Care [facility] - CM/SW Discharge Note   Patient Details  Name: [name] MRN: [id number] Date of Birth: Nov 26, 1961  Transition of Care [facility]) CM/SW Contact:  Lynnell Catalan, RN Phone Number: 11/12/2020, 2:37 PM   Clinical Narrative:     [facility] contacted for transport to [place].    Barriers to Discharge: No Barriers Identified   Patient Goals and CMS Choice Patient states their goals for this hospitalization and ongoing recovery are:: go to rehab      Discharge Placement PASRR number recieved: 11/11/20            Patient chooses bed at: Other - please specify in the comment section below: ([facility]) Patient to be transferred to facility by: [place] Name of family member notified: [name] Patient and family notified of of transfer: 11/12/20  Discharge Plan and Services                                     Social Determinants of Health (SDOH) Interventions     Readmission Risk Interventions No flowsheet data found.

## 2020-11-12 NOTE — Progress Notes (Signed)
Report attempted to be called to [facility], no answer.

## 2020-11-12 NOTE — Progress Notes (Addendum)
PHYSICAL THERAPY HYDROTHERAPY TREATMENT   Pt may not need PT hydrotherapy after d/c. Wound is cleaning up well.     11/12/20 1200  Subjective Assessment  Subjective "i want this wound to get better"  Patient and Family Stated Goals for wound to heal  Date of Onset  (present on admission)  Evaluation and Treatment  Evaluation and Treatment Procedures Explained to Patient/Family Yes  Evaluation and Treatment Procedures agreed to  Pressure Injury 09/27/20 Sacrum Medial Unstageable - Full thickness tissue loss in which the base of the injury is covered by slough (yellow, tan, gray, green or brown) and/or eschar (tan, brown or black) in the wound bed.  Date First Assessed/Time First Assessed: 09/27/20 1534   Location: Sacrum  Location Orientation: Medial  Staging: Unstageable - Full thickness tissue loss in which the base of the injury is covered by slough (yellow, tan, gray, green or brown) and/or ...  Dressing Type Moist to dry;Foam - Lift dressing to assess site every shift (Santyl)  Dressing Changed  Dressing Change Frequency Daily  Site / Wound Assessment Bleeding;Pink;Yellow (fascia)  % Wound base Red or Granulating 50%  % Wound base Yellow/Fibrinous Exudate 25%  % Wound base Other/Granulation Tissue (Comment) 25% (fascia?)  Peri-wound Assessment Erythema (non-blanchable)  Margins Unattached edges (unapproximated)  Drainage Amount Minimal  Drainage Description Odor (yellow-green-possibly effects of Santyl breaking down tissue)  Treatment Debridement (Selective);Hydrotherapy (Pulse lavage);Packing (Saline gauze) (Santyl)  Hydrotherapy  Pulsed lavage therapy - wound location sacrum  Pulsed Lavage with Suction (psi) 8 psi  Pulsed Lavage with Suction - Normal Saline Used 1000 mL  Pulsed Lavage Tip Tip with splash shield  Selective Debridement  Selective Debridement - Location sacrum  Selective Debridement - Tools Used Forceps;Scissors  Selective Debridement - Tissue Removed  yellow slough  Wound Therapy - Assess/Plan/Recommendations  Wound Therapy - Clinical Statement 59 yo female admitted from SNF with GI bleed, sacral pressure ulcer, L heel pressure ulcer. Hx of DM, R BKA, CKD, osteomyelitis, anemia  Factors Delaying/Impairing Wound Healing Diabetes Mellitus;Incontinence;Immobility  Hydrotherapy Plan Debridement;Dressing change;Patient/family education;Pulsatile lavage with suction  Wound Therapy - Frequency 6X / week  Wound Therapy - Current Recommendations Case manager/social work  Wound Therapy - Follow Up Recommendations Skilled nursing facility  Wound Plan Pulsed lavage, enzymatic debridement, dressing changes- 6x/week  Wound Therapy Goals - Improve the function of patient's integumentary system by progressing the wound(s) through the phases of wound healing by:  Decrease Necrotic Tissue to 20%  Decrease Necrotic Tissue - Progress Progressing toward goal  Increase Granulation Tissue to 80%  Increase Granulation Tissue - Progress Progressing toward goal  Goals/treatment plan/discharge plan were made with and agreed upon by patient/family Yes  Time For Goal Achievement 2 weeks  Wound Therapy - Potential for Goals Good     Doreatha Massed, PT Acute Rehabilitation  Office: [phone] Pager: [phone]

## 2020-11-17 DIAGNOSIS — T879 Unspecified complications of amputation stump: Secondary | ICD-10-CM | POA: Diagnosis not present

## 2020-11-17 DIAGNOSIS — I10 Essential (primary) hypertension: Secondary | ICD-10-CM | POA: Diagnosis not present

## 2020-11-17 DIAGNOSIS — E1110 Type 2 diabetes mellitus with ketoacidosis without coma: Secondary | ICD-10-CM | POA: Diagnosis not present

## 2020-11-17 DIAGNOSIS — I739 Peripheral vascular disease, unspecified: Secondary | ICD-10-CM | POA: Diagnosis not present

## 2020-11-18 DIAGNOSIS — I739 Peripheral vascular disease, unspecified: Secondary | ICD-10-CM | POA: Diagnosis not present

## 2020-12-07 ENCOUNTER — Emergency Department (HOSPITAL_COMMUNITY): Payer: BC Managed Care – PPO

## 2020-12-07 ENCOUNTER — Other Ambulatory Visit: Payer: Self-pay

## 2020-12-07 ENCOUNTER — Inpatient Hospital Stay (HOSPITAL_COMMUNITY)
Admission: EM | Admit: 2020-12-07 | Discharge: 2021-01-08 | DRG: 602 | Disposition: A | Discharge status: Skilled Nursing Facility | Payer: BC Managed Care – PPO | Attending: Internal Medicine | Admitting: Internal Medicine

## 2020-12-07 DIAGNOSIS — R339 Retention of urine, unspecified: Secondary | ICD-10-CM | POA: Diagnosis present

## 2020-12-07 DIAGNOSIS — E878 Other disorders of electrolyte and fluid balance, not elsewhere classified: Secondary | ICD-10-CM | POA: Diagnosis present

## 2020-12-07 DIAGNOSIS — F32A Depression, unspecified: Secondary | ICD-10-CM | POA: Diagnosis present

## 2020-12-07 DIAGNOSIS — Z940 Kidney transplant status: Secondary | ICD-10-CM | POA: Diagnosis not present

## 2020-12-07 DIAGNOSIS — M6281 Muscle weakness (generalized): Secondary | ICD-10-CM | POA: Diagnosis not present

## 2020-12-07 DIAGNOSIS — Z9641 Presence of insulin pump (external) (internal): Secondary | ICD-10-CM | POA: Diagnosis not present

## 2020-12-07 DIAGNOSIS — Z7401 Bed confinement status: Secondary | ICD-10-CM | POA: Diagnosis not present

## 2020-12-07 DIAGNOSIS — E43 Unspecified severe protein-calorie malnutrition: Secondary | ICD-10-CM | POA: Diagnosis not present

## 2020-12-07 DIAGNOSIS — N179 Acute kidney failure, unspecified: Secondary | ICD-10-CM | POA: Diagnosis not present

## 2020-12-07 DIAGNOSIS — E039 Hypothyroidism, unspecified: Secondary | ICD-10-CM | POA: Diagnosis not present

## 2020-12-07 DIAGNOSIS — E785 Hyperlipidemia, unspecified: Secondary | ICD-10-CM | POA: Diagnosis present

## 2020-12-07 DIAGNOSIS — R627 Adult failure to thrive: Secondary | ICD-10-CM | POA: Diagnosis present

## 2020-12-07 DIAGNOSIS — R4182 Altered mental status, unspecified: Secondary | ICD-10-CM | POA: Diagnosis not present

## 2020-12-07 DIAGNOSIS — Y835 Amputation of limb(s) as the cause of abnormal reaction of the patient, or of later complication, without mention of misadventure at the time of the procedure: Secondary | ICD-10-CM | POA: Diagnosis not present

## 2020-12-07 DIAGNOSIS — I10 Essential (primary) hypertension: Secondary | ICD-10-CM

## 2020-12-07 DIAGNOSIS — D631 Anemia in chronic kidney disease: Secondary | ICD-10-CM | POA: Diagnosis not present

## 2020-12-07 DIAGNOSIS — Z79890 Hormone replacement therapy (postmenopausal): Secondary | ICD-10-CM | POA: Diagnosis not present

## 2020-12-07 DIAGNOSIS — R404 Transient alteration of awareness: Secondary | ICD-10-CM | POA: Diagnosis not present

## 2020-12-07 DIAGNOSIS — L89222 Pressure ulcer of left hip, stage 2: Secondary | ICD-10-CM | POA: Diagnosis present

## 2020-12-07 DIAGNOSIS — R509 Fever, unspecified: Secondary | ICD-10-CM

## 2020-12-07 DIAGNOSIS — L89153 Pressure ulcer of sacral region, stage 3: Secondary | ICD-10-CM | POA: Diagnosis not present

## 2020-12-07 DIAGNOSIS — I959 Hypotension, unspecified: Secondary | ICD-10-CM | POA: Diagnosis present

## 2020-12-07 DIAGNOSIS — Y830 Surgical operation with transplant of whole organ as the cause of abnormal reaction of the patient, or of later complication, without mention of misadventure at the time of the procedure: Secondary | ICD-10-CM | POA: Diagnosis present

## 2020-12-07 DIAGNOSIS — M79604 Pain in right leg: Secondary | ICD-10-CM | POA: Diagnosis not present

## 2020-12-07 DIAGNOSIS — K2210 Ulcer of esophagus without bleeding: Secondary | ICD-10-CM | POA: Diagnosis not present

## 2020-12-07 DIAGNOSIS — D509 Iron deficiency anemia, unspecified: Secondary | ICD-10-CM | POA: Diagnosis not present

## 2020-12-07 DIAGNOSIS — L0390 Cellulitis, unspecified: Secondary | ICD-10-CM | POA: Diagnosis not present

## 2020-12-07 DIAGNOSIS — T8789 Other complications of amputation stump: Secondary | ICD-10-CM | POA: Diagnosis not present

## 2020-12-07 DIAGNOSIS — T8619 Other complication of kidney transplant: Secondary | ICD-10-CM | POA: Diagnosis present

## 2020-12-07 DIAGNOSIS — L98499 Non-pressure chronic ulcer of skin of other sites with unspecified severity: Secondary | ICD-10-CM | POA: Diagnosis not present

## 2020-12-07 DIAGNOSIS — L89154 Pressure ulcer of sacral region, stage 4: Secondary | ICD-10-CM | POA: Diagnosis present

## 2020-12-07 DIAGNOSIS — E871 Hypo-osmolality and hyponatremia: Secondary | ICD-10-CM

## 2020-12-07 DIAGNOSIS — M4628 Osteomyelitis of vertebra, sacral and sacrococcygeal region: Secondary | ICD-10-CM | POA: Diagnosis not present

## 2020-12-07 DIAGNOSIS — R410 Disorientation, unspecified: Secondary | ICD-10-CM | POA: Diagnosis not present

## 2020-12-07 DIAGNOSIS — R338 Other retention of urine: Secondary | ICD-10-CM | POA: Diagnosis not present

## 2020-12-07 DIAGNOSIS — L089 Local infection of the skin and subcutaneous tissue, unspecified: Secondary | ICD-10-CM

## 2020-12-07 DIAGNOSIS — N1831 Chronic kidney disease, stage 3a: Secondary | ICD-10-CM | POA: Diagnosis not present

## 2020-12-07 DIAGNOSIS — I9589 Other hypotension: Secondary | ICD-10-CM | POA: Diagnosis not present

## 2020-12-07 DIAGNOSIS — E1129 Type 2 diabetes mellitus with other diabetic kidney complication: Secondary | ICD-10-CM | POA: Diagnosis not present

## 2020-12-07 DIAGNOSIS — N3289 Other specified disorders of bladder: Secondary | ICD-10-CM | POA: Diagnosis not present

## 2020-12-07 DIAGNOSIS — S2231XA Fracture of one rib, right side, initial encounter for closed fracture: Secondary | ICD-10-CM | POA: Diagnosis not present

## 2020-12-07 DIAGNOSIS — L03312 Cellulitis of back [any part except buttock]: Secondary | ICD-10-CM | POA: Diagnosis not present

## 2020-12-07 DIAGNOSIS — I129 Hypertensive chronic kidney disease with stage 1 through stage 4 chronic kidney disease, or unspecified chronic kidney disease: Secondary | ICD-10-CM | POA: Diagnosis not present

## 2020-12-07 DIAGNOSIS — I130 Hypertensive heart and chronic kidney disease with heart failure and stage 1 through stage 4 chronic kidney disease, or unspecified chronic kidney disease: Secondary | ICD-10-CM | POA: Diagnosis not present

## 2020-12-07 DIAGNOSIS — Z515 Encounter for palliative care: Secondary | ICD-10-CM | POA: Diagnosis not present

## 2020-12-07 DIAGNOSIS — R8290 Unspecified abnormal findings in urine: Secondary | ICD-10-CM | POA: Diagnosis present

## 2020-12-07 DIAGNOSIS — Z794 Long term (current) use of insulin: Secondary | ICD-10-CM | POA: Diagnosis not present

## 2020-12-07 DIAGNOSIS — T148XXA Other injury of unspecified body region, initial encounter: Secondary | ICD-10-CM

## 2020-12-07 DIAGNOSIS — N1832 Chronic kidney disease, stage 3b: Secondary | ICD-10-CM | POA: Diagnosis present

## 2020-12-07 DIAGNOSIS — Z89511 Acquired absence of right leg below knee: Secondary | ICD-10-CM | POA: Diagnosis not present

## 2020-12-07 DIAGNOSIS — E108 Type 1 diabetes mellitus with unspecified complications: Secondary | ICD-10-CM

## 2020-12-07 DIAGNOSIS — R54 Age-related physical debility: Secondary | ICD-10-CM | POA: Diagnosis present

## 2020-12-07 DIAGNOSIS — A419 Sepsis, unspecified organism: Secondary | ICD-10-CM | POA: Diagnosis present

## 2020-12-07 DIAGNOSIS — R531 Weakness: Secondary | ICD-10-CM | POA: Diagnosis not present

## 2020-12-07 DIAGNOSIS — Z79899 Other long term (current) drug therapy: Secondary | ICD-10-CM

## 2020-12-07 DIAGNOSIS — Z6820 Body mass index (BMI) 20.0-20.9, adult: Secondary | ICD-10-CM

## 2020-12-07 DIAGNOSIS — N189 Chronic kidney disease, unspecified: Secondary | ICD-10-CM

## 2020-12-07 DIAGNOSIS — I708 Atherosclerosis of other arteries: Secondary | ICD-10-CM | POA: Diagnosis present

## 2020-12-07 DIAGNOSIS — R52 Pain, unspecified: Secondary | ICD-10-CM | POA: Diagnosis not present

## 2020-12-07 DIAGNOSIS — I5032 Chronic diastolic (congestive) heart failure: Secondary | ICD-10-CM | POA: Diagnosis present

## 2020-12-07 DIAGNOSIS — F339 Major depressive disorder, recurrent, unspecified: Secondary | ICD-10-CM | POA: Diagnosis not present

## 2020-12-07 DIAGNOSIS — E1059 Type 1 diabetes mellitus with other circulatory complications: Secondary | ICD-10-CM | POA: Diagnosis not present

## 2020-12-07 DIAGNOSIS — J189 Pneumonia, unspecified organism: Secondary | ICD-10-CM | POA: Diagnosis not present

## 2020-12-07 DIAGNOSIS — L89899 Pressure ulcer of other site, unspecified stage: Secondary | ICD-10-CM | POA: Diagnosis not present

## 2020-12-07 DIAGNOSIS — J690 Pneumonitis due to inhalation of food and vomit: Secondary | ICD-10-CM | POA: Diagnosis not present

## 2020-12-07 DIAGNOSIS — I96 Gangrene, not elsewhere classified: Secondary | ICD-10-CM

## 2020-12-07 DIAGNOSIS — Z7951 Long term (current) use of inhaled steroids: Secondary | ICD-10-CM | POA: Diagnosis not present

## 2020-12-07 DIAGNOSIS — K209 Esophagitis, unspecified without bleeding: Secondary | ICD-10-CM | POA: Diagnosis not present

## 2020-12-07 DIAGNOSIS — E1069 Type 1 diabetes mellitus with other specified complication: Secondary | ICD-10-CM | POA: Diagnosis present

## 2020-12-07 DIAGNOSIS — E860 Dehydration: Secondary | ICD-10-CM | POA: Diagnosis present

## 2020-12-07 DIAGNOSIS — L89892 Pressure ulcer of other site, stage 2: Secondary | ICD-10-CM | POA: Diagnosis not present

## 2020-12-07 DIAGNOSIS — E109 Type 1 diabetes mellitus without complications: Secondary | ICD-10-CM

## 2020-12-07 DIAGNOSIS — E440 Moderate protein-calorie malnutrition: Secondary | ICD-10-CM

## 2020-12-07 DIAGNOSIS — Z8249 Family history of ischemic heart disease and other diseases of the circulatory system: Secondary | ICD-10-CM | POA: Diagnosis not present

## 2020-12-07 DIAGNOSIS — E11622 Type 2 diabetes mellitus with other skin ulcer: Secondary | ICD-10-CM | POA: Diagnosis not present

## 2020-12-07 DIAGNOSIS — E8809 Other disorders of plasma-protein metabolism, not elsewhere classified: Secondary | ICD-10-CM

## 2020-12-07 DIAGNOSIS — L89159 Pressure ulcer of sacral region, unspecified stage: Secondary | ICD-10-CM | POA: Diagnosis not present

## 2020-12-07 DIAGNOSIS — L89622 Pressure ulcer of left heel, stage 2: Secondary | ICD-10-CM | POA: Diagnosis present

## 2020-12-07 DIAGNOSIS — Z20822 Contact with and (suspected) exposure to covid-19: Secondary | ICD-10-CM | POA: Diagnosis not present

## 2020-12-07 DIAGNOSIS — Z9483 Pancreas transplant status: Secondary | ICD-10-CM | POA: Diagnosis not present

## 2020-12-07 DIAGNOSIS — J9811 Atelectasis: Secondary | ICD-10-CM | POA: Diagnosis not present

## 2020-12-07 DIAGNOSIS — Z7960 Long term (current) use of unspecified immunomodulators and immunosuppressants: Secondary | ICD-10-CM

## 2020-12-07 DIAGNOSIS — G9341 Metabolic encephalopathy: Secondary | ICD-10-CM | POA: Diagnosis present

## 2020-12-07 DIAGNOSIS — E1022 Type 1 diabetes mellitus with diabetic chronic kidney disease: Secondary | ICD-10-CM | POA: Diagnosis present

## 2020-12-07 DIAGNOSIS — D696 Thrombocytopenia, unspecified: Secondary | ICD-10-CM | POA: Diagnosis not present

## 2020-12-07 DIAGNOSIS — N2889 Other specified disorders of kidney and ureter: Secondary | ICD-10-CM | POA: Diagnosis not present

## 2020-12-07 DIAGNOSIS — R9431 Abnormal electrocardiogram [ECG] [EKG]: Secondary | ICD-10-CM | POA: Diagnosis not present

## 2020-12-07 DIAGNOSIS — J811 Chronic pulmonary edema: Secondary | ICD-10-CM | POA: Diagnosis not present

## 2020-12-07 DIAGNOSIS — R601 Generalized edema: Secondary | ICD-10-CM | POA: Diagnosis not present

## 2020-12-07 DIAGNOSIS — M2550 Pain in unspecified joint: Secondary | ICD-10-CM | POA: Diagnosis not present

## 2020-12-07 DIAGNOSIS — N183 Chronic kidney disease, stage 3 unspecified: Secondary | ICD-10-CM | POA: Diagnosis not present

## 2020-12-07 DIAGNOSIS — Z7189 Other specified counseling: Secondary | ICD-10-CM | POA: Diagnosis not present

## 2020-12-07 LAB — CBC WITH DIFFERENTIAL/PLATELET
Abs Immature Granulocytes: 0.07 10*3/uL (ref 0.00–0.07)
Basophils Absolute: 0 10*3/uL (ref 0.0–0.1)
Basophils Relative: 0 %
Eosinophils Absolute: 0 10*3/uL (ref 0.0–0.5)
Eosinophils Relative: 1 %
HCT: 25.1 % — ABNORMAL LOW (ref 36.0–46.0)
Hemoglobin: 8 g/dL — ABNORMAL LOW (ref 12.0–15.0)
Immature Granulocytes: 1 %
Lymphocytes Relative: 18 %
Lymphs Abs: 1 10*3/uL (ref 0.7–4.0)
MCH: 33.3 pg (ref 26.0–34.0)
MCHC: 31.9 g/dL (ref 30.0–36.0)
MCV: 104.6 fL — ABNORMAL HIGH (ref 80.0–100.0)
Monocytes Absolute: 0.3 10*3/uL (ref 0.1–1.0)
Monocytes Relative: 6 %
Neutro Abs: 4.2 10*3/uL (ref 1.7–7.7)
Neutrophils Relative %: 74 %
Platelets: 246 10*3/uL (ref 150–400)
RBC: 2.4 MIL/uL — ABNORMAL LOW (ref 3.87–5.11)
RDW: 19.5 % — ABNORMAL HIGH (ref 11.5–15.5)
WBC: 5.6 10*3/uL (ref 4.0–10.5)
nRBC: 0.4 % — ABNORMAL HIGH (ref 0.0–0.2)

## 2020-12-07 LAB — CBG MONITORING, ED: Glucose-Capillary: 105 mg/dL — ABNORMAL HIGH (ref 70–99)

## 2020-12-07 LAB — RESP PANEL BY RT-PCR (FLU A&B, COVID) ARPGX2
Influenza A by PCR: NEGATIVE
Influenza B by PCR: NEGATIVE
SARS Coronavirus 2 by RT PCR: NEGATIVE

## 2020-12-07 LAB — COMPREHENSIVE METABOLIC PANEL
ALT: 9 U/L (ref 0–44)
AST: 14 U/L — ABNORMAL LOW (ref 15–41)
Albumin: 2 g/dL — ABNORMAL LOW (ref 3.5–5.0)
Alkaline Phosphatase: 82 U/L (ref 38–126)
Anion gap: 13 (ref 5–15)
BUN: 86 mg/dL — ABNORMAL HIGH (ref 6–20)
CO2: 29 mmol/L (ref 22–32)
Calcium: 8.9 mg/dL (ref 8.9–10.3)
Chloride: 90 mmol/L — ABNORMAL LOW (ref 98–111)
Creatinine, Ser: 1.97 mg/dL — ABNORMAL HIGH (ref 0.44–1.00)
GFR, Estimated: 29 mL/min — ABNORMAL LOW (ref >60)
Glucose, Bld: 118 mg/dL — ABNORMAL HIGH (ref 70–99)
Potassium: 4.2 mmol/L (ref 3.5–5.1)
Sodium: 132 mmol/L — ABNORMAL LOW (ref 135–145)
Total Bilirubin: 0.8 mg/dL (ref 0.3–1.2)
Total Protein: 5.4 g/dL — ABNORMAL LOW (ref 6.5–8.1)

## 2020-12-07 LAB — PROTIME-INR
INR: 1.1 (ref 0.8–1.2)
Prothrombin Time: 13.3 seconds (ref 11.4–15.2)

## 2020-12-07 LAB — PROCALCITONIN: Procalcitonin: 0.8 ng/mL

## 2020-12-07 LAB — LACTIC ACID, PLASMA: Lactic Acid, Venous: 1.8 mmol/L (ref 0.5–1.9)

## 2020-12-07 LAB — APTT: aPTT: 31 seconds (ref 24–36)

## 2020-12-07 MED ORDER — LACTATED RINGERS IV BOLUS (SEPSIS)
1000.0000 mL | Freq: Once | INTRAVENOUS | Status: AC
  Administered 2020-12-07: 1000 mL via INTRAVENOUS

## 2020-12-07 MED ORDER — VANCOMYCIN HCL IN DEXTROSE 1-5 GM/200ML-% IV SOLN
1000.0000 mg | Freq: Once | INTRAVENOUS | Status: AC
  Administered 2020-12-07: 1000 mg via INTRAVENOUS
  Filled 2020-12-07: qty 200

## 2020-12-07 MED ORDER — METRONIDAZOLE IN NACL 5-0.79 MG/ML-% IV SOLN
500.0000 mg | Freq: Once | INTRAVENOUS | Status: AC
  Administered 2020-12-07: 500 mg via INTRAVENOUS
  Filled 2020-12-07: qty 100

## 2020-12-07 MED ORDER — SODIUM CHLORIDE 0.9 % IV SOLN
2.0000 g | Freq: Once | INTRAVENOUS | Status: AC
  Administered 2020-12-07: 2 g via INTRAVENOUS
  Filled 2020-12-07: qty 2

## 2020-12-07 MED ORDER — METRONIDAZOLE IN NACL 5-0.79 MG/ML-% IV SOLN
500.0000 mg | Freq: Three times a day (TID) | INTRAVENOUS | Status: DC
  Administered 2020-12-08: 500 mg via INTRAVENOUS
  Filled 2020-12-07 (×2): qty 100

## 2020-12-07 MED ORDER — LACTATED RINGERS IV SOLN: INTRAVENOUS | Status: DC

## 2020-12-07 MED ORDER — HYDROMORPHONE HCL 1 MG/ML IJ SOLN
0.5000 mg | Freq: Once | INTRAMUSCULAR | Status: AC
  Administered 2020-12-07: 0.5 mg via INTRAVENOUS
  Filled 2020-12-07: qty 1

## 2020-12-07 NOTE — H&P (Signed)
[name] GGE:[id number] DOB: 02-Dec-1961 DOA: 12/07/2020     PCP: [name], MD   Outpatient Specialists:    NEphrology: Dr. Graylon Gunning    GI Dr. Henrene Pastor ( [facility])  orthopedics Dr. Sharol Given  Patient arrived to ER on 12/07/20 at 1927 Referred by Attending Valarie Merino, MD   Patient coming from:  From facility [facility]  Chief Complaint:    Chief Complaint  Patient presents with  . Altered Mental Status    Patient here via ems from facility with c/o AMS with yellow drainage from sacral ulcer with odor.    HPI: [name] is a 59 y.o. 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 Staff was concerned for possible worsening sacral decub infection given yellow drainage in order Patient has chronic stage III decubitus She did have some diarrhea and fevers at the facility up to 101 No associated cough chest pain no nausea vomiting no abdominal pain no urinary discomfort Patient has history of frequent admissions currently resides at SNF last admission was for esophagitis with coffee-ground emesis patient was discharged back to SNF on 12 November 2020  Infectious risk factors:  Reports  fever     Initial COVID TEST  NEGATIVE  Lab Results  Component Value Date   [place] 12/07/2020   [place] NEGATIVE 11/08/2020   [place] NEGATIVE 11/07/2020   [place] NEGATIVE 10/29/2020     Regarding pertinent Chronic problems:  Renal and pancreatic transplant -on Imuran cyclosporine and prednisone  Sacral decub ulcer wound care  HTN on metoprolol torsemide    DM 2 -  Lab  Results  Component Value Date   HGBA1C 7.2 (H) 09/26/2020   on insulin,     Hypothyroidism on synthroid     CKD stage III- baseline Cr 1.5 CrCl cannot be calculated (Unknown ideal weight.).  Lab Results  Component Value Date   CREATININE 1.97 (H) 12/07/2020   CREATININE 1.26 (H) 11/10/2020   CREATININE 1.85 (H) 11/08/2020      Chronic anemia - baseline hg Hemoglobin & Hematocrit  Recent Labs    11/09/20 0450 11/10/20 0910 12/07/20 2010  HGB 9.2* 8.3* 8.0*     While in ER: Initially noted to be hypotensive 83/50 Noted to have a large sacral decub ulcer CT scan showed no osteomyelitis 2000 ml of foul milky urine was obtained when bladder was placed Urine flow was positional despite bladder in place  Code sepsis have been activated  Hospitalist was called for admission for sepsis secondary to decubitus ulcer versus early pneumonia  The following Work up has been ordered so far:  Orders Placed This Encounter  Procedures  . Blood culture (routine single)  . Urine culture  . Resp Panel by RT-PCR (Flu A&B, Covid) Nasopharyngeal Swab  . DG Chest Port 1 View  . CT PELVIS WO CONTRAST  . Lactic acid, plasma  . Comprehensive metabolic panel  . CBC WITH DIFFERENTIAL  . Protime-INR  . APTT  . Urinalysis, Routine w reflex microscopic  . Diet NPO time specified  . Cardiac monitoring  . Document height and weight  . Assess and Document Glasgow Coma Scale  . Document vital signs  within 1-hour of fluid bolus completion.  Notify provider of abnormal vital signs despite fluid resuscitation.  . Refer to Sidebar Report: Sepsis Bundle ED/IP  . Notify provider for difficulties obtaining IV access  . Initiate Carrier Fluid Protocol  . Insert foley catheter  . Code Sepsis activation.  This occurs automatically when order is signed and prioritizes pharmacy, lab, and radiology services for STAT collections and interventions.  If [facility] downtime, call [facility] [phone]) to activate  Code Sepsis.  . Consult to hospitalist  ALL PATIENTS BEING ADMITTED/HAVING PROCEDURES NEED COVID-19 SCREENING  . Pulse oximetry, continuous  . POC CBG, ED  . ED EKG 12-Lead  . Insert peripheral IV X 1    Following Medications were ordered in ER: Medications  lactated ringers infusion ( Intravenous New Bag/Given 12/07/20 2212)  metroNIDAZOLE (FLAGYL) IVPB 500 mg (500 mg Intravenous New Bag/Given 12/07/20 2154)  HYDROmorphone (DILAUDID) injection 0.5 mg (has no administration in time range)  lactated ringers bolus 1,000 mL (1,000 mLs Intravenous New Bag/Given 12/07/20 2030)    And  lactated ringers bolus 1,000 mL (0 mLs Intravenous Stopped 12/07/20 2212)  ceFEPIme (MAXIPIME) 2 g in sodium chloride 0.9 % 100 mL IVPB (0 g Intravenous Stopped 12/07/20 2105)  vancomycin (VANCOCIN) IVPB 1000 mg/200 mL premix (0 mg Intravenous Stopped 12/07/20 2212)        Consult Orders  (From admission, onward)         Start     Ordered   12/07/20 2211  Consult to hospitalist  ALL PATIENTS BEING ADMITTED/HAVING PROCEDURES NEED COVID-19 SCREENING  Once       Comments: ALL PATIENTS BEING ADMITTED/HAVING PROCEDURES NEED COVID-19 SCREENING  Provider:  (Not yet assigned)  Question Answer Comment  Place call to: [facility]   Reason for Consult Admit      12/07/20 2210          Significant initial  Findings: Abnormal Labs Reviewed  COMPREHENSIVE METABOLIC PANEL - Abnormal; Notable for the following components:      Result Value   Sodium 132 (*)    Chloride 90 (*)    Glucose, Bld 118 (*)    BUN 86 (*)    Creatinine, Ser 1.97 (*)    Total Protein 5.4 (*)    Albumin 2.0 (*)    AST 14 (*)    GFR, Estimated 29 (*)    All other components within normal limits  CBC WITH DIFFERENTIAL/PLATELET - Abnormal; Notable for the following components:   RBC 2.40 (*)    Hemoglobin 8.0 (*)    HCT 25.1 (*)    MCV 104.6 (*)    RDW 19.5 (*)    nRBC 0.4 (*)    All other components within normal limits   CBG MONITORING, ED - Abnormal; Notable for the following components:   Glucose-Capillary 105 (*)    All other components within normal limits    Otherwise labs showing:  Recent Labs  Lab 12/07/20 2010  NA 132*  K 4.2  CO2 29  GLUCOSE 118*  BUN 86*  CREATININE 1.97*  CALCIUM 8.9    Cr   Up from baseline see below Lab Results  Component Value Date   CREATININE 1.97 (H) 12/07/2020   CREATININE 1.26 (H) 11/10/2020   CREATININE 1.85 (H) 11/08/2020    Recent Labs  Lab 12/07/20 2010  AST 14*  ALT 9  ALKPHOS 82  BILITOT 0.8  PROT 5.4*  ALBUMIN 2.0*   Lab Results  Component Value Date  CALCIUM 8.9 12/07/2020   PHOS 4.3 11/04/2020   WBC      Component Value Date/Time   WBC 5.6 12/07/2020 2010   LYMPHSABS 1.0 12/07/2020 2010   MONOABS 0.3 12/07/2020 2010   EOSABS 0.0 12/07/2020 2010   BASOSABS 0.0 12/07/2020 2010    Plt: Lab Results  Component Value Date   PLT 246 12/07/2020   Lactic Acid, Venous    Component Value Date/Time   LATICACIDVEN 1.8 12/07/2020 2015     Procalcitonin 0.8    Arterial  HG/HCT  stable,      Component Value Date/Time   HGB 8.0 (L) 12/07/2020 2010   HCT 25.1 (L) 12/07/2020 2010   MCV 104.6 (H) 12/07/2020 2010   ECG: Ordered Personally reviewed by me showing: HR : 84 Rhythm: NSR low voltage  no evidence of ischemic changes QTC 435    DM  labs:  HbA1C: Recent Labs    09/26/20 1534  HGBA1C 7.2*       CBG (last 3)  Recent Labs    12/07/20 2024  GLUCAP 105*     UA  ordered   Urine analysis:    Component Value Date/Time   COLORURINE YELLOW 11/08/2020 1843   APPEARANCEUR HAZY (A) 11/08/2020 1843   LABSPEC 1.012 11/08/2020 1843   PHURINE 6.0 11/08/2020 1843   GLUCOSEU >=500 (A) 11/08/2020 1843   HGBUR MODERATE (A) 11/08/2020 1843   BILIRUBINUR NEGATIVE 11/08/2020 1843   KETONESUR 20 (A) 11/08/2020 1843   PROTEINUR 30 (A) 11/08/2020 1843   NITRITE NEGATIVE 11/08/2020 1843   LEUKOCYTESUR LARGE (A) 11/08/2020  1843    Ordered   CXR - atelectasis or pneumonia.  CT pelvis - bladder distention       ED Triage Vitals  Enc Vitals Group     BP 12/07/20 2000 (!) 83/50     Pulse Rate 12/07/20 2000 82     Resp 12/07/20 2005 15     Temp 12/07/20 2005 99.2 F (37.3 C)     Temp Source 12/07/20 2005 Rectal     SpO2 12/07/20 1951 100 %     Weight --      Height --      Head Circumference --      Peak Flow --      Pain Score --      Pain Loc --      Pain Edu? --      Excl. in [place]? --   TMAX(24)@       Latest  Blood pressure (!) 148/82, pulse 72, temperature 99.2 F (37.3 C), temperature source Rectal, resp. rate 16, SpO2 100 %.     Review of Systems:    Pertinent positives include:   Fevers, chills, fatigue,  Constitutional:  No weight loss, night sweats, weight loss  confusion HEENT:  No headaches, Difficulty swallowing,Tooth/dental problems,Sore throat,  No sneezing, itching, ear ache, nasal congestion, post nasal drip,  Cardio-vascular:  No chest pain, Orthopnea, PND, anasarca, dizziness, palpitations.no Bilateral lower extremity swelling  GI:  No heartburn, indigestion, abdominal pain, nausea, vomiting, diarrhea, change in bowel habits, loss of appetite, melena, blood in stool, hematemesis Resp:  no shortness of breath at rest. No dyspnea on exertion, No excess mucus, no productive cough, No non-productive cough, No coughing up of blood.No change in color of mucus.No wheezing. Skin:  no rash or lesions. No jaundice GU:  no dysuria, change in color of urine, no urgency or frequency. No straining to urinate.  No  flank pain.  Musculoskeletal:  No joint pain or no joint swelling. No decreased range of motion. No back pain.  Psych:  No change in mood or affect. No depression or anxiety. No memory loss.  Neuro: no localizing neurological complaints, no tingling, no weakness, no double vision, no gait abnormality, no slurred speech, no  All systems reviewed and apart from [place] all  are negative  Past Medical History:   Past Medical History:  Diagnosis Date  . Anemia of chronic renal failure   . Diabetes mellitus without complication [facility])       Past Surgical History:  Procedure Laterality Date  . AMPUTATION Right 10/01/2020   Procedure: RIGHT BELOW KNEE AMPUTATION;  Surgeon: Newt Minion, MD;  Location: [place] [facility];  Service: Orthopedics;  Laterality: Right;  . BLADDER SURGERY    . ESOPHAGOGASTRODUODENOSCOPY (EGD) WITH PROPOFOL N/A 11/10/2020   Procedure: ESOPHAGOGASTRODUODENOSCOPY (EGD) WITH PROPOFOL;  Surgeon: Irene Shipper, MD;  Location: [facility];  Service: Endoscopy;  Laterality: N/A;  . KIDNEY TRANSPLANT    . LEG SURGERY      Social History:  Ambulatory   bed bound     reports that she has never smoked. She has never used smokeless tobacco. She reports that she does not drink alcohol and does not use drugs.   Family History:   Family History  Problem Relation Age of Onset  . Hypertension Mother   . Hypertension Father     Allergies: No Known Allergies   Prior to Admission medications   Medication Sig Start Date End Date Taking? Authorizing Provider  acetaminophen (TYLENOL) 325 MG tablet Take 2 tablets (650 mg total) by mouth every 6 (six) hours as needed for mild pain (or Fever >/= 101). Patient taking differently: Take 650 mg by mouth every 6 (six) hours as needed for mild pain or fever.  10/06/20   Nita Sells, MD  azaTHIOprine (IMURAN) 50 MG tablet Take 50 mg by mouth daily. 08/23/20   [provider]  collagenase (SANTYL) ointment Apply topically daily. 11/08/20   Swayze, Ava, DO  cycloSPORINE (SANDIMMUNE) 25 MG capsule Take 75 mg by mouth 2 (two) times daily.    [provider]  insulin glargine (LANTUS) 100 UNIT/ML injection Inject 0.08 mLs (8 Units total) into the skin 2 (two) times daily. 11/07/20   Swayze, Ava, DO  insulin lispro (ADMELOG) 100 UNIT/ML injection Inject 2 Units into the skin 3 (three) times  daily before meals.    [provider]  levothyroxine (SYNTHROID) 50 MCG tablet Take 50 mcg by mouth daily. 08/23/20   [provider]  metoprolol tartrate (LOPRESSOR) 25 MG tablet Take 12.5 mg by mouth 2 (two) times daily.  09/15/20   [provider]  nutrition supplement, JUVEN, (JUVEN) PACK Take 1 packet by mouth 2 (two) times daily between meals. 10/06/20   Nita Sells, MD  Nystatin (GERHARDT'S BUTT CREAM) CREA Apply 1 application topically 2 (two) times daily. 11/07/20   Swayze, Ava, DO  pantoprazole (PROTONIX) 40 MG tablet Take 1 tablet (40 mg total) by mouth 2 (two) times daily. 11/12/20   Barb Merino, MD  predniSONE (DELTASONE) 5 MG tablet Take 5 mg by mouth daily. 07/13/20   [provider]  rosuvastatin (CRESTOR) 5 MG tablet Take 1 tablet (5 mg total) by mouth at bedtime. 11/07/20   Swayze, Ava, DO  sertraline (ZOLOFT) 25 MG tablet Take 1 tablet (25 mg total) by mouth daily. 10/06/20   Nita Sells, MD  sodium bicarbonate 650 MG tablet Take 1 tablet (650 mg total) by mouth 2 (two) times daily. 10/06/20   Nita Sells, MD  torsemide (DEMADEX) 20 MG tablet Take 1 tablet (20 mg total) by mouth daily. 11/13/20   Barb Merino, MD   Physical Exam: Vitals with BMI 12/07/2020 12/07/2020 12/07/2020  Height - - -  Weight - - -  BMI - - -  Systolic 160 109 323  Diastolic 82 79 82  Pulse 72 77 79     1. General:  in No  Acute distress   Chronically ill  -appearing 2. Psychological: Alert and  Oriented 3. Head/ENT:   Dry Mucous Membranes                          Head Non traumatic, neck supple                         Poor Dentition 4. SKIN:  decreased Skin turgor,  Skin 6-7 cm sacral decube, and vulval swelling    5. Heart: Regular rate and rhythm no Murmur, no Rub or gallop 6. Lungs:  no wheezes or crackles   7. Abdomen: Soft,  non-tender, Non distended  bowel sounds present 8. Lower extremities: no clubbing, cyanosis,  no  Edema sp Right BKA 9. Neurologically Grossly intact, moving all 4 extremities equally  10. MSK: Normal range of motion   All other LABS:     Recent Labs  Lab 12/07/20 2010  WBC 5.6  NEUTROABS 4.2  HGB 8.0*  HCT 25.1*  MCV 104.6*  PLT 246     Recent Labs  Lab 12/07/20 2010  NA 132*  K 4.2  CL 90*  CO2 29  GLUCOSE 118*  BUN 86*  CREATININE 1.97*  CALCIUM 8.9     Recent Labs  Lab 12/07/20 2010  AST 14*  ALT 9  ALKPHOS 82  BILITOT 0.8  PROT 5.4*  ALBUMIN 2.0*      Cultures:    Component Value Date/Time   SDES  09/26/2020 1734    FOOT RIGHT Performed at [facility] [address], [place], [place] [address]    SPECREQUEST  09/26/2020 1734    NONE Performed at [facility] [place] [address], [place], [place] [address]    CULT  09/26/2020 1734    ABUNDANT SERRATIA MARCESCENS MODERATE PSEUDOMONAS AERUGINOSA    REPTSTATUS 09/30/2020 FINAL 09/26/2020 1734     Radiological Exams on Admission: CT PELVIS WO CONTRAST  Result Date: 12/07/2020 CLINICAL DATA:  Sacral ulcer, concern for osteomyelitis EXAM: CT PELVIS WITHOUT CONTRAST TECHNIQUE: Multidetector CT imaging of the pelvis was performed following the standard protocol without intravenous contrast. COMPARISON:  None. FINDINGS: Urinary Tract: Urinary bladder is markedly distended to the level of the umbilicus. There appears to be a renal transplant kidney in the cul-de-sac of the pelvis. Mild caliectasis. Bowel:  Unremarkable visualized pelvic bowel loops. Vascular/Lymphatic: Heavily calcified aorta and iliac vessels. No adenopathy. Reproductive:  No visible pelvic mass. Other:  No free fluid or free air. Musculoskeletal: Sacral decubital ulcer noted. No bone destruction in the sacrum or coccyx to suggest osteomyelitis. Prior right hip replacement. IMPRESSION: Sacral decubitus ulcer.  No evidence of osteomyelitis. Apparent renal transplant within the cul-de-sac of the  pelvis. Mild caliectasis noted within the renal transplant. Urinary bladder is markedly distended to the level of the umbilicus. Aortoiliac atherosclerosis. Electronically Signed  By: Rolm Baptise M.D.   On: 12/07/2020 21:51   DG Chest Port 1 View  Result Date: 12/07/2020 CLINICAL DATA:  Questionable sepsis - evaluate for abnormality Altered mental status. EXAM: PORTABLE CHEST 1 VIEW COMPARISON:  Radiograph 09/26/2020. FINDINGS: Lung volumes are low. There are streaky opacities at the left lung base with ill-defined left hemidiaphragm. Stable heart size and mediastinal contours allowing for differences in positioning. No pulmonary edema. There is no pneumothorax or large pleural effusion. Remote right rib fractures. Bones are diffusely under mineralized. IMPRESSION: Low lung volumes with streaky opacities at the left lung base, which may represent atelectasis or pneumonia. Electronically Signed   By: Keith Rake M.D.   On: 12/07/2020 21:00    Chart has been reviewed  Assessment/Plan  59 y.o. 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 Pressure injury of skin -possible underlying cause of infection Obtain wound care consult No evidence of osteomyelitis per CT   . Acute urinary retention -after Foley placement was able to obtain 2000 mL of urine foul and milky Suspect underlying UTI Urine output is very positional despite Foley in place If continues to have evidence of urinary retention despite Foley in place will need urology consult in a.m.    Marland Kitchen Chronic kidney disease (CKD) stage G3a/A1, moderately  decreased glomerular filtration rate (GFR) between 45-59 mL/min/1.73 square meter and albuminuria creatinine ratio less than 30 mg/g (HCC) -  -chronic avoid nephrotoxic medications such as NSAIDs, Vanco Zosyn combo,  avoid hypotension, continue to follow renal function  . Acute kidney injury superimposed on CKD ([place]) -suspect secondary to dehydration but also urinary retention we will continue to follow renal function Patient status post renal transplant Would benefit from nephrology consult Continue antirejection medications  . Anemia secondary to renal failure -currently stable continue to monitor   Hx of renal and pancreatic transplant - continue antirejection meds, nephrology consult in a.m.   Marland Kitchen Sepsis ([place]) -   -SIRS criteria not met  hypotension   was due to ( dehydration )  No fever in ER, no WBC no increase in RR no tachycardia Will continue to monitor And treat underlining infections   Acute metabolic encephalopathy -   - most likely multifactorial secondary to combination of  Infection  mild dehydration secondary to decreased by mouth intake,    - Will rehydrate   - treat underlining infection   - Hold contributing medications     . Hypoalbuminemia -check prealbumin order nutritional consult  . Hyponatremia -likely secondary to dehydration obtain urine electrolytes Rehydrate and follow  . CAP (community acquired pneumonia) -chest x-ray showing questionable pneumonia continue broad-spectrum antibiotics that should cover pulmonary source as well obtain Strep antigen, sputum cultures blood cultures   . Hypertension - given soft BP  Hold home meds   DM 2-  - Order Sensitive   SSI   -    Lantus  8 units QHS,  -  check TSH and HgA1C  - Hold by mouth medications     Other plan as per orders.  DVT prophylaxis:  SCD       Code Status:    Code Status: Prior FULL CODE  as perreccors will need to confirm with family in AM attempted to call at night no answer     Family Communication:   Family not at  Bedside    Disposition Plan:                             Back to current facility when stable                              Following barriers for discharge:                            Electrolytes corrected                                                         Afebrile,   transition to PO antibiotics                             Will need to be able to tolerate PO                                                       Will need consultants to evaluate patient prior to discharge                      Would benefit from PT/OT eval prior to DC  Ordered                   Swallow eval - SLP ordered                                      Transition of care consulted                   Nutrition    consulted                  Wound care  consulted                   Palliative care    consulted                   Consults called: Notified Dr. Johnney Ou (nephrology) through page re consult in AM  Admission status:  ED Disposition    ED Disposition Condition [place]: [place] [[id number]]  Level of Care: Stepdown [14]  Admit to SDU based on following criteria: Hemodynamic compromise or significant risk of instability:  Patient requiring short term acute titration and management of vasoactive drips, and invasive monitoring (i.e., CVP and Arterial line).  May admit patient to [facility] or [facility] if equivalent level of care is available:: No  Covid Evaluation: Confirmed COVID Negative  Diagnosis: Sepsis [facility]) [[id number]]  Admitting Physician: Toy Baker [[id number]]  Attending Physician: Toy Baker [[id number]]  Estimated length of stay: 3 - 4  days  Certification:: I certify this patient will need inpatient services for at least 2 midnights         inpatient     I Expect 2 midnight stay secondary to severity of patient's current illness need for inpatient interventions justified by the following:   hemodynamic instability despite optimal treatment (tachycardia  Hypotension )   Severe lab/radiological/exam abnormalities including:    AKI and extensive comorbidities including:  DM2  CKD .  transplant status  That are currently affecting medical management.   I expect  patient to be hospitalized for 2 midnights requiring inpatient medical care.  Patient is at high risk for adverse outcome (such as loss of life or disability) if not treated.  Indication for inpatient stay as follows:  Severe change from baseline regarding mental status Hemodynamic instability despite maximal medical therapy,    Need for IV antibiotics, IV fluids,      Level of care           SDU tele indefinitely please discontinue once patient no longer qualifies COVID-19 Labs    Lab Results  Component Value Date   [place] NEGATIVE 12/07/2020     Precautions: admitted as   Covid Negative     PPE: Used by the provider:   P100  eye Goggles,  Gloves     Anastassia Doutova 12/08/2020, 12:57 AM    [facility]     after 2 AM please page floor coverage PA If 7AM-7PM, please contact the day team taking care of the patient using [email or web address]   Patient was evaluated in the context of the global COVID-19 pandemic, which necessitated consideration that the patient might be at risk for infection with the SARS-CoV-2 virus that causes COVID-19. Institutional protocols and algorithms that pertain to the evaluation of patients at risk for COVID-19 are in a state of rapid change based on information released by regulatory bodies including the CDC and federal and state organizations. These policies and algorithms were followed during the patient's care.

## 2020-12-07 NOTE — ED Triage Notes (Signed)
Patient bib ems with c/o ams and possible sacral ulcer infection with yellow drainage and odor.

## 2020-12-07 NOTE — Progress Notes (Signed)
Code Sepsis initiated @ 2018, [place] following.

## 2020-12-07 NOTE — ED Notes (Signed)
Cardiac monitor vital signs that says that the patient's heart rate is zero is incorrect.  Monitor leads has been changed.

## 2020-12-07 NOTE — ED Provider Notes (Signed)
[place] DEPT Provider Note   CSN: [id number] Arrival date & time: 12/07/20  1927     History Chief Complaint  Patient presents with  . Altered Mental Status    Patient here via ems from facility with c/o AMS with yellow drainage from sacral ulcer with odor.    [name] is a 59 y.o. female.  HPI   59 year old female the history of anemia, diabetes, who presents emergency department today for evaluation of altered mental status.  Patient is coming from [facility] skilled nursing facility.  Reportedly per EMS patient was seen by her husband and was noted to be altered and lethargic.  She has a known sacral ulcer that has been draining fluid and is malodorous.  She also has a recent right lower extremity BKA.  Patient states that she has had some diarrhea and she is had some fevers at her facility up to 101.  She denies any cough, chest pain, abdominal pain or vomiting.  She denies any urinary symptoms.  Past Medical History:  Diagnosis Date  . Anemia of chronic renal failure   . Diabetes mellitus without complication [facility])     Patient Active Problem List   Diagnosis Date Noted  . Sepsis ([place]) 12/07/2020  . Coffee ground emesis   . Gastroesophageal reflux disease with esophagitis and hemorrhage   . Long-term use of immunosuppressant medication   . GI bleed 11/08/2020  . AKI (acute kidney injury) ([place]) 10/29/2020  . Gangrene of right foot ([place])   . Subacute osteomyelitis of right foot ([place])   . Diabetes mellitus with hyperglycemia ([place]) 09/27/2020  . Pressure injury of skin 09/27/2020  . Diabetic ketoacidosis ([place]) 09/26/2020  . Acute kidney injury superimposed on CKD ([place]) 09/26/2020  . Cellulitis and abscess of right leg 09/26/2020  . 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 History of simultaneous kidney and pancreas transplant ([place]) 05/14/2020  . Chronic kidney disease (CKD) stage G3a/A1, moderately decreased glomerular  filtration rate (GFR) between 45-59 mL/min/1.73 square meter and albuminuria creatinine ratio less than 30 mg/g ([facility]) 05/14/2020  . Hypertension 05/14/2020    Past Surgical History:  Procedure Laterality Date  . AMPUTATION Right 10/01/2020   Procedure: RIGHT BELOW KNEE AMPUTATION;  Surgeon: Newt Minion, MD;  Location: [place];  Service: Orthopedics;  Laterality: Right;  . BLADDER SURGERY    . ESOPHAGOGASTRODUODENOSCOPY (EGD) WITH PROPOFOL N/A 11/10/2020   Procedure: ESOPHAGOGASTRODUODENOSCOPY (EGD) WITH PROPOFOL;  Surgeon: Irene Shipper, MD;  [facility];  Service: Endoscopy;  Laterality: N/A;  . KIDNEY TRANSPLANT    . LEG SURGERY       OB History   No obstetric history on file.     Family History  Problem Relation Age of Onset  . Hypertension Mother   . Hypertension Father     Social History   Tobacco Use  . Smoking status: Never Smoker  . Smokeless tobacco: Never Used  Vaping Use  . Vaping Use: Never used  Substance Use Topics  . Alcohol use: Never  . Drug use: Never    Home Medications Prior to Admission medications   Medication Sig Start Date End Date Taking? Authorizing Provider  acetaminophen (TYLENOL) 325 MG tablet Take 2 tablets (650 mg total) by mouth every 6 (six) hours as needed for mild pain (or Fever >/= 101). Patient taking differently: Take 650 mg by mouth every 6 (six) hours as needed for mild pain or fever.  10/06/20   Samtani,  Jai-Gurmukh, MD  azaTHIOprine (IMURAN) 50 MG tablet Take 50 mg by mouth daily. 08/23/20   [provider]  collagenase (SANTYL) ointment Apply topically daily. 11/08/20   Swayze, Ava, DO  cycloSPORINE (SANDIMMUNE) 25 MG capsule Take 75 mg by mouth 2 (two) times daily.    [provider]  insulin glargine (LANTUS) 100 UNIT/ML injection Inject 0.08 mLs (8 Units total) into the skin 2 (two) times daily. 11/07/20   Swayze, Ava, DO  insulin lispro (ADMELOG) 100 UNIT/ML injection Inject 2 Units into the skin  3 (three) times daily before meals.    [provider]  levothyroxine (SYNTHROID) 50 MCG tablet Take 50 mcg by mouth daily. 08/23/20   [provider]  metoprolol tartrate (LOPRESSOR) 25 MG tablet Take 12.5 mg by mouth 2 (two) times daily.  09/15/20   [provider]  nutrition supplement, JUVEN, (JUVEN) PACK Take 1 packet by mouth 2 (two) times daily between meals. 10/06/20   Nita Sells, MD  Nystatin (GERHARDT'S BUTT CREAM) CREA Apply 1 application topically 2 (two) times daily. 11/07/20   Swayze, Ava, DO  pantoprazole (PROTONIX) 40 MG tablet Take 1 tablet (40 mg total) by mouth 2 (two) times daily. 11/12/20   Barb Merino, MD  predniSONE (DELTASONE) 5 MG tablet Take 5 mg by mouth daily. 07/13/20   [provider]  rosuvastatin (CRESTOR) 5 MG tablet Take 1 tablet (5 mg total) by mouth at bedtime. 11/07/20   Swayze, Ava, DO  sertraline (ZOLOFT) 25 MG tablet Take 1 tablet (25 mg total) by mouth daily. 10/06/20   Nita Sells, MD  sodium bicarbonate 650 MG tablet Take 1 tablet (650 mg total) by mouth 2 (two) times daily. 10/06/20   Nita Sells, MD  torsemide (DEMADEX) 20 MG tablet Take 1 tablet (20 mg total) by mouth daily. 11/13/20   Barb Merino, MD    Allergies    Patient has no known allergies.  Review of Systems   Review of Systems  Constitutional: Positive for fatigue and fever.       Ams  HENT: Negative for ear pain and sore throat.   Eyes: Negative for visual disturbance.  Respiratory: Negative for cough and shortness of breath.   Cardiovascular: Negative for chest pain.  Gastrointestinal: Positive for diarrhea. Negative for abdominal pain, constipation, nausea and vomiting.  Genitourinary: Negative for dysuria and hematuria.  Musculoskeletal: Negative for back pain.  Skin: Positive for wound.  Neurological: Negative for headaches.  All other systems reviewed and are negative.   Physical Exam Updated Vital  Signs BP (!) 159/80   Pulse 70   Temp 99.2 F (37.3 C) (Rectal)   Resp 16   SpO2 100%   Physical Exam Vitals and nursing note reviewed.  Constitutional:      General: She is not in acute distress.    Appearance: She is well-developed and well-nourished.     Comments: Chronically ill appearing female  HENT:     Head: Normocephalic and atraumatic.  Eyes:     Conjunctiva/sclera: Conjunctivae normal.  Cardiovascular:     Rate and Rhythm: Normal rate and regular rhythm.     Heart sounds: Normal heart sounds. No murmur heard.   Pulmonary:     Effort: Pulmonary effort is normal. No respiratory distress.     Breath sounds: Normal breath sounds. No wheezing, rhonchi or rales.  Abdominal:     General: Bowel sounds are normal.     Palpations: Abdomen is soft.  Tenderness: There is no abdominal tenderness. There is no guarding or rebound.  Musculoskeletal:        General: No edema.     Cervical back: Neck supple.     Comments: Right BKA with small superficial wound distally with no surrounding erythema/edema or purulence  Skin:    General: Skin is warm and dry.     Comments: Large sacral decubitus ulcer (pictured below)  Neurological:     Mental Status: She is alert.  Psychiatric:        Mood and Affect: Mood and affect normal.            ED Results / Procedures / Treatments   Labs (all labs ordered are listed, but only abnormal results are displayed) Labs Reviewed  COMPREHENSIVE METABOLIC PANEL - Abnormal; Notable for the following components:      Result Value   Sodium 132 (*)    Chloride 90 (*)    Glucose, Bld 118 (*)    BUN 86 (*)    Creatinine, Ser 1.97 (*)    Total Protein 5.4 (*)    Albumin 2.0 (*)    AST 14 (*)    GFR, Estimated 29 (*)    All other components within normal limits  CBC WITH DIFFERENTIAL/PLATELET - Abnormal; Notable for the following components:   RBC 2.40 (*)    Hemoglobin 8.0 (*)    HCT 25.1 (*)    MCV 104.6 (*)    RDW 19.5 (*)     nRBC 0.4 (*)    All other components within normal limits  CBG MONITORING, ED - Abnormal; Notable for the following components:   Glucose-Capillary 105 (*)    All other components within normal limits  RESP PANEL BY RT-PCR (FLU A&B, COVID) ARPGX2  CULTURE, BLOOD (SINGLE)  URINE CULTURE  LACTIC ACID, PLASMA  PROTIME-INR  APTT  LACTIC ACID, PLASMA  URINALYSIS, ROUTINE W REFLEX MICROSCOPIC  PROCALCITONIN    EKG EKG Interpretation  Date/Time:  Sunday December 07 2020 20:07:03 EST Ventricular Rate:  84 PR Interval:    QRS Duration: 82 QT Interval:  364 QTC Calculation: 431 R Axis:   42 Text Interpretation: Sinus rhythm Low voltage, extremity and precordial leads Probable anteroseptal infarct, old Confirmed by Dene Gentry [phone]) on 12/07/2020 8:09:25 PM   Radiology CT PELVIS WO CONTRAST  Result Date: 12/07/2020 CLINICAL DATA:  Sacral ulcer, concern for osteomyelitis EXAM: CT PELVIS WITHOUT CONTRAST TECHNIQUE: Multidetector CT imaging of the pelvis was performed following the standard protocol without intravenous contrast. COMPARISON:  None. FINDINGS: Urinary Tract: Urinary bladder is markedly distended to the level of the umbilicus. There appears to be a renal transplant kidney in the cul-de-sac of the pelvis. Mild caliectasis. Bowel:  Unremarkable visualized pelvic bowel loops. Vascular/Lymphatic: Heavily calcified aorta and iliac vessels. No adenopathy. Reproductive:  No visible pelvic mass. Other:  No free fluid or free air. Musculoskeletal: Sacral decubital ulcer noted. No bone destruction in the sacrum or coccyx to suggest osteomyelitis. Prior right hip replacement. IMPRESSION: Sacral decubitus ulcer.  No evidence of osteomyelitis. Apparent renal transplant within the cul-de-sac of the pelvis. Mild caliectasis noted within the renal transplant. Urinary bladder is markedly distended to the level of the umbilicus. Aortoiliac atherosclerosis. Electronically Signed   By: Rolm Baptise M.D.   On: 12/07/2020 21:51   DG Chest Port 1 View  Result Date: 12/07/2020 CLINICAL DATA:  Questionable sepsis - evaluate for abnormality Altered mental status. EXAM: PORTABLE CHEST 1 VIEW COMPARISON:  Radiograph 09/26/2020. FINDINGS: Lung volumes are low. There are streaky opacities at the left lung base with ill-defined left hemidiaphragm. Stable heart size and mediastinal contours allowing for differences in positioning. No pulmonary edema. There is no pneumothorax or large pleural effusion. Remote right rib fractures. Bones are diffusely under mineralized. IMPRESSION: Low lung volumes with streaky opacities at the left lung base, which may represent atelectasis or pneumonia. Electronically Signed   By: Keith Rake M.D.   On: 12/07/2020 21:00    Procedures Procedures (including critical care time)  CRITICAL CARE Performed by: Rodney Booze   Total critical care time: 32 minutes  Critical care time was exclusive of separately billable procedures and treating other patients.  Critical care was necessary to treat or prevent imminent or life-threatening deterioration.  Critical care was time spent personally by me on the following activities: development of treatment plan with patient and/or surrogate as well as nursing, discussions with consultants, evaluation of patient's response to treatment, examination of patient, obtaining history from patient or surrogate, ordering and performing treatments and interventions, ordering and review of laboratory studies, ordering and review of radiographic studies, pulse oximetry and re-evaluation of patient's condition.   Medications Ordered in ED Medications  lactated ringers infusion ( Intravenous New Bag/Given 12/07/20 2212)  metroNIDAZOLE (FLAGYL) IVPB 500 mg (500 mg Intravenous New Bag/Given 12/07/20 2154)  metroNIDAZOLE (FLAGYL) IVPB 500 mg (has no administration in time range)  lactated ringers bolus 1,000 mL (1,000 mLs  Intravenous New Bag/Given 12/07/20 2030)    And  lactated ringers bolus 1,000 mL (0 mLs Intravenous Stopped 12/07/20 2212)  ceFEPIme (MAXIPIME) 2 g in sodium chloride 0.9 % 100 mL IVPB (0 g Intravenous Stopped 12/07/20 2105)  vancomycin (VANCOCIN) IVPB 1000 mg/200 mL premix (0 mg Intravenous Stopped 12/07/20 2212)  HYDROmorphone (DILAUDID) injection 0.5 mg (0.5 mg Intravenous Given 12/07/20 2244)    ED Course  I have reviewed the triage vital signs and the nursing notes.  Pertinent labs & imaging results that were available during my care of the patient were reviewed by me and considered in my medical decision making (see chart for details).    MDM Rules/Calculators/A&P                          59 year old female presents to the emergency department today for evaluation of altered mental status.  Reviewed/interpreted labs CBC w/o leukocytosis, anemia present but chronic CMP with mild hyponatremia and hypochloremia, elevated BUN/Cr at baseline, otherwise at baseline coags wnl Lactic acid negative Blood cultures obtained  UA pending on admission  EKG - Sinus rhythm Low voltage, extremity and precordial leads Probable anteroseptal infarct, old   Imaging reviewed/interpreted -  CXR - Low lung volumes with streaky opacities at the left lung base, which may represent atelectasis or pneumonia. CT pelvis w/o contrast -  Sacral decubitus ulcer.  No evidence of osteomyelitis. Apparent renal transplant within the cul-de-sac of the pelvis. Mild caliectasis noted within the renal transplant. Urinary bladder is markedly distended to the level of the umbilicus. Aortoiliac atherosclerosis.   Pt presenting hypotensive with reported fevers up to 101F at her facility. Altered today. Concern for infection of the sacral decubitus ulcer. Code sepsis called due to hypotension and reported fevers at home. Ivf and broad spectrum abx given which improved hypotension.  10:48 PM CONSULT with Dr. Harlin Heys with  hospitalist service who accepts patient for admission.   Final Clinical Impression(s) / ED Diagnoses Final diagnoses:  Wound infection    Rx / DC Orders ED Discharge Orders    None       Bishop Dublin 12/07/20 2250    Valarie Merino, MD 12/09/20 [phone]

## 2020-12-07 NOTE — Progress Notes (Signed)
Pharmacy Antibiotic Note  [name] is a 59 y.o. female admitted on 12/07/2020 with sepsis.  Pharmacy has been consulted for Cefepime and Vancomycin dosing. Flagyl per MD  Plan: Cefepime 2gm q24 Flagyl 500mg  IV q8 Vancomycin 1gm x1, then 500mg  q24 SCr daily    Temp (24hrs), Avg:99.2 F (37.3 C), Min:99.2 F (37.3 C), Max:99.2 F (37.3 C)  Recent Labs  Lab 12/07/20 2010 12/07/20 2015  WBC 5.6  --   CREATININE 1.97*  --   LATICACIDVEN  --  1.8    CrCl cannot be calculated (Unknown ideal weight.).    No Known Allergies  Antimicrobials this admission: 12/12 Cefepime >>  12/12 Vancomycin >>  12/12 Flagyl >>  Dose adjustments this admission:  Microbiology results: 12/12 BCx x 1set: sent UCx: ordered  1212 MRSA PCR: neg  Thank you for allowing pharmacy to be a part of this patient's care.  Minda Ditto PharmD 12/07/2020 10:56 PM

## 2020-12-08 ENCOUNTER — Encounter (HOSPITAL_COMMUNITY): Payer: Self-pay | Admitting: Internal Medicine

## 2020-12-08 ENCOUNTER — Encounter: Payer: Self-pay | Admitting: Diagnostic Neuroimaging

## 2020-12-08 ENCOUNTER — Inpatient Hospital Stay (HOSPITAL_COMMUNITY): Payer: BC Managed Care – PPO

## 2020-12-08 DIAGNOSIS — L89154 Pressure ulcer of sacral region, stage 4: Secondary | ICD-10-CM

## 2020-12-08 DIAGNOSIS — E039 Hypothyroidism, unspecified: Secondary | ICD-10-CM

## 2020-12-08 DIAGNOSIS — I9589 Other hypotension: Secondary | ICD-10-CM

## 2020-12-08 DIAGNOSIS — I959 Hypotension, unspecified: Secondary | ICD-10-CM | POA: Diagnosis present

## 2020-12-08 DIAGNOSIS — L0390 Cellulitis, unspecified: Secondary | ICD-10-CM | POA: Diagnosis present

## 2020-12-08 DIAGNOSIS — E108 Type 1 diabetes mellitus with unspecified complications: Secondary | ICD-10-CM

## 2020-12-08 DIAGNOSIS — K2210 Ulcer of esophagus without bleeding: Secondary | ICD-10-CM

## 2020-12-08 DIAGNOSIS — G9341 Metabolic encephalopathy: Secondary | ICD-10-CM | POA: Diagnosis present

## 2020-12-08 DIAGNOSIS — E1069 Type 1 diabetes mellitus with other specified complication: Secondary | ICD-10-CM

## 2020-12-08 DIAGNOSIS — E109 Type 1 diabetes mellitus without complications: Secondary | ICD-10-CM

## 2020-12-08 DIAGNOSIS — Z9641 Presence of insulin pump (external) (internal): Secondary | ICD-10-CM

## 2020-12-08 DIAGNOSIS — R9431 Abnormal electrocardiogram [ECG] [EKG]: Secondary | ICD-10-CM

## 2020-12-08 DIAGNOSIS — E861 Hypovolemia: Secondary | ICD-10-CM

## 2020-12-08 DIAGNOSIS — F32A Depression, unspecified: Secondary | ICD-10-CM

## 2020-12-08 DIAGNOSIS — R8290 Unspecified abnormal findings in urine: Secondary | ICD-10-CM

## 2020-12-08 HISTORY — DX: Pressure ulcer of sacral region, stage 4: L89.154

## 2020-12-08 HISTORY — DX: Hypothyroidism, unspecified: E03.9

## 2020-12-08 LAB — MAGNESIUM
Magnesium: 1.9 mg/dL (ref 1.7–2.4)
Magnesium: 1.8 mg/dL (ref 1.7–2.4)

## 2020-12-08 LAB — URINALYSIS, ROUTINE W REFLEX MICROSCOPIC
Bilirubin Urine: NEGATIVE
Glucose, UA: NEGATIVE mg/dL
Ketones, ur: NEGATIVE mg/dL
Nitrite: NEGATIVE
Protein, ur: 100 mg/dL — AB
Specific Gravity, Urine: 1.008 (ref 1.005–1.030)
WBC, UA: >50 WBC/hpf — ABNORMAL HIGH (ref 0–5)
pH: 6 (ref 5.0–8.0)

## 2020-12-08 LAB — CBC WITH DIFFERENTIAL/PLATELET
Abs Immature Granulocytes: 0.07 10*3/uL (ref 0.00–0.07)
Basophils Absolute: 0 10*3/uL (ref 0.0–0.1)
Basophils Relative: 0 %
Eosinophils Absolute: 0.1 10*3/uL (ref 0.0–0.5)
Eosinophils Relative: 2 %
HCT: 26.4 % — ABNORMAL LOW (ref 36.0–46.0)
Hemoglobin: 8.3 g/dL — ABNORMAL LOW (ref 12.0–15.0)
Immature Granulocytes: 1 %
Lymphocytes Relative: 24 %
Lymphs Abs: 1.2 10*3/uL (ref 0.7–4.0)
MCH: 33.3 pg (ref 26.0–34.0)
MCHC: 31.4 g/dL (ref 30.0–36.0)
MCV: 106 fL — ABNORMAL HIGH (ref 80.0–100.0)
Monocytes Absolute: 0.3 10*3/uL (ref 0.1–1.0)
Monocytes Relative: 6 %
Neutro Abs: 3.3 10*3/uL (ref 1.7–7.7)
Neutrophils Relative %: 67 %
Platelets: 210 10*3/uL (ref 150–400)
RBC: 2.49 MIL/uL — ABNORMAL LOW (ref 3.87–5.11)
RDW: 19.5 % — ABNORMAL HIGH (ref 11.5–15.5)
WBC: 4.9 10*3/uL (ref 4.0–10.5)
nRBC: 0 % (ref 0.0–0.2)

## 2020-12-08 LAB — COMPREHENSIVE METABOLIC PANEL
ALT: 9 U/L (ref 0–44)
AST: 13 U/L — ABNORMAL LOW (ref 15–41)
Albumin: 1.9 g/dL — ABNORMAL LOW (ref 3.5–5.0)
Alkaline Phosphatase: 78 U/L (ref 38–126)
Anion gap: 14 (ref 5–15)
BUN: 76 mg/dL — ABNORMAL HIGH (ref 6–20)
CO2: 26 mmol/L (ref 22–32)
Calcium: 8.7 mg/dL — ABNORMAL LOW (ref 8.9–10.3)
Chloride: 91 mmol/L — ABNORMAL LOW (ref 98–111)
Creatinine, Ser: 1.78 mg/dL — ABNORMAL HIGH (ref 0.44–1.00)
GFR, Estimated: 32 mL/min — ABNORMAL LOW (ref >60)
Glucose, Bld: 106 mg/dL — ABNORMAL HIGH (ref 70–99)
Potassium: 4.1 mmol/L (ref 3.5–5.1)
Sodium: 131 mmol/L — ABNORMAL LOW (ref 135–145)
Total Bilirubin: 0.8 mg/dL (ref 0.3–1.2)
Total Protein: 5.2 g/dL — ABNORMAL LOW (ref 6.5–8.1)

## 2020-12-08 LAB — RETICULOCYTES
Immature Retic Fract: 11.6 % (ref 2.3–15.9)
RBC.: 2.49 MIL/uL — ABNORMAL LOW (ref 3.87–5.11)
Retic Count, Absolute: 23.4 10*3/uL (ref 19.0–186.0)
Retic Ct Pct: 0.9 % (ref 0.4–3.1)

## 2020-12-08 LAB — IRON AND TIBC
Iron: 69 ug/dL (ref 28–170)
Saturation Ratios: 65 % — ABNORMAL HIGH (ref 10.4–31.8)
TIBC: 106 ug/dL — ABNORMAL LOW (ref 250–450)
UIBC: 37 ug/dL

## 2020-12-08 LAB — TSH: TSH: 33.751 u[IU]/mL — ABNORMAL HIGH (ref 0.350–4.500)

## 2020-12-08 LAB — GLUCOSE, CAPILLARY
Glucose-Capillary: 163 mg/dL — ABNORMAL HIGH (ref 70–99)
Glucose-Capillary: 162 mg/dL — ABNORMAL HIGH (ref 70–99)

## 2020-12-08 LAB — ECHOCARDIOGRAM COMPLETE
Area-P 1/2: 2.56 cm2
P 1/2 time: 520 msec
S' Lateral: 1.2 cm
Weight: 1840 oz

## 2020-12-08 LAB — HEMOGLOBIN A1C
Hgb A1c MFr Bld: 8.6 % — ABNORMAL HIGH (ref 4.8–5.6)
Mean Plasma Glucose: 200 mg/dL

## 2020-12-08 LAB — PHOSPHORUS
Phosphorus: 3.9 mg/dL (ref 2.5–4.6)
Phosphorus: 3.3 mg/dL (ref 2.5–4.6)

## 2020-12-08 LAB — CBG MONITORING, ED
Glucose-Capillary: 103 mg/dL — ABNORMAL HIGH (ref 70–99)
Glucose-Capillary: 95 mg/dL (ref 70–99)
Glucose-Capillary: 88 mg/dL (ref 70–99)

## 2020-12-08 LAB — FERRITIN: Ferritin: 1214 ng/mL — ABNORMAL HIGH (ref 11–307)

## 2020-12-08 LAB — STREP PNEUMONIAE URINARY ANTIGEN: Strep Pneumo Urinary Antigen: NEGATIVE

## 2020-12-08 LAB — URINE CULTURE: Culture: 90000 — AB

## 2020-12-08 LAB — FOLATE: Folate: 8.5 ng/mL (ref >5.9)

## 2020-12-08 LAB — SODIUM, URINE, RANDOM: Sodium, Ur: 65 mmol/L

## 2020-12-08 LAB — OSMOLALITY, URINE: Osmolality, Ur: 307 mOsm/kg (ref 300–900)

## 2020-12-08 LAB — VITAMIN B12: Vitamin B-12: 2160 pg/mL — ABNORMAL HIGH (ref 180–914)

## 2020-12-08 LAB — CREATININE, URINE, RANDOM: Creatinine, Urine: 23.63 mg/dL

## 2020-12-08 LAB — CK: Total CK: 11 U/L — ABNORMAL LOW (ref 38–234)

## 2020-12-08 LAB — PROCALCITONIN: Procalcitonin: 0.61 ng/mL

## 2020-12-08 LAB — MRSA PCR SCREENING: MRSA by PCR: POSITIVE — AB

## 2020-12-08 LAB — PREALBUMIN: Prealbumin: 7.2 mg/dL — ABNORMAL LOW (ref 18–38)

## 2020-12-08 MED ORDER — INSULIN ASPART 100 UNIT/ML ~~LOC~~ SOLN
0.0000 [IU] | SUBCUTANEOUS | Status: DC
  Administered 2020-12-08: 2 [IU] via SUBCUTANEOUS
  Administered 2020-12-09: 5 [IU] via SUBCUTANEOUS
  Administered 2020-12-09: 2 [IU] via SUBCUTANEOUS
  Administered 2020-12-09 (×2): 5 [IU] via SUBCUTANEOUS
  Administered 2020-12-10: 2 [IU] via SUBCUTANEOUS
  Administered 2020-12-10: 3 [IU] via SUBCUTANEOUS
  Administered 2020-12-10: 7 [IU] via SUBCUTANEOUS
  Administered 2020-12-10 (×2): 5 [IU] via SUBCUTANEOUS
  Administered 2020-12-10: 7 [IU] via SUBCUTANEOUS
  Administered 2020-12-11: 3 [IU] via SUBCUTANEOUS
  Administered 2020-12-11 (×4): 5 [IU] via SUBCUTANEOUS
  Administered 2020-12-11: 7 [IU] via SUBCUTANEOUS
  Administered 2020-12-11: 5 [IU] via SUBCUTANEOUS
  Administered 2020-12-12: 2 [IU] via SUBCUTANEOUS
  Administered 2020-12-12: 3 [IU] via SUBCUTANEOUS
  Administered 2020-12-12: 7 [IU] via SUBCUTANEOUS
  Administered 2020-12-12 (×2): 2 [IU] via SUBCUTANEOUS
  Administered 2020-12-12: 3 [IU] via SUBCUTANEOUS
  Administered 2020-12-13: 1 [IU] via SUBCUTANEOUS
  Administered 2020-12-13: 3 [IU] via SUBCUTANEOUS
  Administered 2020-12-13: 2 [IU] via SUBCUTANEOUS
  Administered 2020-12-13: 1 [IU] via SUBCUTANEOUS
  Administered 2020-12-14: 5 [IU] via SUBCUTANEOUS
  Administered 2020-12-14 (×2): 2 [IU] via SUBCUTANEOUS
  Administered 2020-12-14: 3 [IU] via SUBCUTANEOUS
  Administered 2020-12-14: 5 [IU] via SUBCUTANEOUS
  Administered 2020-12-14: 2 [IU] via SUBCUTANEOUS
  Administered 2020-12-15: 5 [IU] via SUBCUTANEOUS
  Administered 2020-12-15: 2 [IU] via SUBCUTANEOUS
  Administered 2020-12-15: 3 [IU] via SUBCUTANEOUS
  Administered 2020-12-15 – 2020-12-16 (×4): 1 [IU] via SUBCUTANEOUS
  Administered 2020-12-16: 3 [IU] via SUBCUTANEOUS
  Administered 2020-12-16 (×2): 2 [IU] via SUBCUTANEOUS
  Administered 2020-12-17 (×2): 3 [IU] via SUBCUTANEOUS
  Administered 2020-12-17: 1 [IU] via SUBCUTANEOUS
  Administered 2020-12-17: 3 [IU] via SUBCUTANEOUS
  Administered 2020-12-18: 1 [IU] via SUBCUTANEOUS
  Administered 2020-12-18: 5 [IU] via SUBCUTANEOUS
  Administered 2020-12-18: 1 [IU] via SUBCUTANEOUS
  Administered 2020-12-18: 7 [IU] via SUBCUTANEOUS
  Filled 2020-12-08: qty 0.09

## 2020-12-08 MED ORDER — PREDNISONE 5 MG PO TABS
5.0000 mg | ORAL_TABLET | Freq: Every day | ORAL | Status: DC
  Administered 2020-12-08: 5 mg via ORAL
  Filled 2020-12-08: qty 1

## 2020-12-08 MED ORDER — CYCLOSPORINE 25 MG PO CAPS
75.0000 mg | ORAL_CAPSULE | Freq: Two times a day (BID) | ORAL | Status: DC
  Administered 2020-12-08 – 2021-01-08 (×63): 75 mg via ORAL
  Filled 2020-12-08 (×65): qty 3

## 2020-12-08 MED ORDER — NYSTATIN 100000 UNIT/GM EX POWD
Freq: Three times a day (TID) | CUTANEOUS | Status: DC
  Administered 2020-12-10: 1 via TOPICAL
  Filled 2020-12-08 (×3): qty 15

## 2020-12-08 MED ORDER — METRONIDAZOLE 500 MG PO TABS
500.0000 mg | ORAL_TABLET | Freq: Three times a day (TID) | ORAL | Status: AC
  Administered 2020-12-08 – 2020-12-14 (×20): 500 mg via ORAL
  Filled 2020-12-08 (×20): qty 1

## 2020-12-08 MED ORDER — SERTRALINE HCL 25 MG PO TABS
25.0000 mg | ORAL_TABLET | Freq: Every day | ORAL | Status: DC
  Administered 2020-12-08 – 2021-01-08 (×32): 25 mg via ORAL
  Filled 2020-12-08 (×32): qty 1

## 2020-12-08 MED ORDER — AZATHIOPRINE 50 MG PO TABS
50.0000 mg | ORAL_TABLET | Freq: Every day | ORAL | Status: DC
  Administered 2020-12-09 – 2021-01-08 (×31): 50 mg via ORAL
  Filled 2020-12-08 (×31): qty 1

## 2020-12-08 MED ORDER — VANCOMYCIN HCL 500 MG/100ML IV SOLN
500.0000 mg | INTRAVENOUS | Status: DC
  Administered 2020-12-08 – 2020-12-09 (×2): 500 mg via INTRAVENOUS
  Filled 2020-12-08 (×2): qty 100

## 2020-12-08 MED ORDER — LEVOTHYROXINE SODIUM 50 MCG PO TABS: 50.0000 ug | ORAL_TABLET | Freq: Every day | ORAL | Status: DC

## 2020-12-08 MED ORDER — SODIUM CHLORIDE 0.9 % IV SOLN
2.0000 g | INTRAVENOUS | Status: DC
  Administered 2020-12-08 – 2020-12-10 (×3): 2 g via INTRAVENOUS
  Filled 2020-12-08 (×4): qty 2

## 2020-12-08 MED ORDER — PANTOPRAZOLE SODIUM 40 MG PO TBEC: 40.0000 mg | DELAYED_RELEASE_TABLET | Freq: Two times a day (BID) | ORAL | Status: DC

## 2020-12-08 MED ORDER — CHLORHEXIDINE GLUCONATE CLOTH 2 % EX PADS
6.0000 | MEDICATED_PAD | Freq: Every day | CUTANEOUS | Status: DC
  Administered 2020-12-08 – 2021-01-08 (×30): 6 via TOPICAL

## 2020-12-08 MED ORDER — ACETAMINOPHEN 650 MG RE SUPP: 650.0000 mg | Freq: Four times a day (QID) | RECTAL | Status: DC | PRN

## 2020-12-08 MED ORDER — AZATHIOPRINE 50 MG PO TABS
50.0000 mg | ORAL_TABLET | Freq: Every day | ORAL | Status: DC
  Administered 2020-12-08: 50 mg via ORAL
  Filled 2020-12-08: qty 1

## 2020-12-08 MED ORDER — INSULIN GLARGINE 100 UNIT/ML ~~LOC~~ SOLN
8.0000 [IU] | Freq: Every day | SUBCUTANEOUS | Status: DC
  Administered 2020-12-08 – 2020-12-10 (×3): 8 [IU] via SUBCUTANEOUS
  Filled 2020-12-08 (×3): qty 0.08

## 2020-12-08 MED ORDER — ROSUVASTATIN CALCIUM 5 MG PO TABS
5.0000 mg | ORAL_TABLET | Freq: Every day | ORAL | Status: DC
  Administered 2020-12-08 – 2021-01-07 (×31): 5 mg via ORAL
  Filled 2020-12-08 (×31): qty 1

## 2020-12-08 MED ORDER — LEVOTHYROXINE SODIUM 50 MCG PO TABS
50.0000 ug | ORAL_TABLET | Freq: Every day | ORAL | Status: DC
  Administered 2020-12-08 – 2021-01-08 (×32): 50 ug via ORAL
  Filled 2020-12-08 (×33): qty 1

## 2020-12-08 MED ORDER — SODIUM CHLORIDE 0.9 % IV SOLN: INTRAVENOUS | Status: DC

## 2020-12-08 MED ORDER — CHLORHEXIDINE GLUCONATE CLOTH 2 % EX PADS
6.0000 | MEDICATED_PAD | Freq: Every day | CUTANEOUS | Status: AC
  Administered 2020-12-10 – 2020-12-13 (×4): 6 via TOPICAL

## 2020-12-08 MED ORDER — HYDROCODONE-ACETAMINOPHEN 5-325 MG PO TABS
1.0000 | ORAL_TABLET | ORAL | Status: DC | PRN
  Administered 2020-12-08 – 2020-12-09 (×4): 1 via ORAL
  Administered 2020-12-09 – 2020-12-10 (×3): 2 via ORAL
  Administered 2020-12-10: 1 via ORAL
  Administered 2020-12-11 – 2020-12-14 (×11): 2 via ORAL
  Administered 2020-12-15 (×2): 1 via ORAL
  Administered 2020-12-15: 2 via ORAL
  Administered 2020-12-16: 1 via ORAL
  Administered 2020-12-17 – 2020-12-20 (×9): 2 via ORAL
  Administered 2020-12-20 – 2020-12-24 (×9): 1 via ORAL
  Administered 2020-12-24: 2 via ORAL
  Administered 2020-12-24 – 2020-12-25 (×2): 1 via ORAL
  Administered 2020-12-26 (×2): 2 via ORAL
  Administered 2020-12-26 (×2): 1 via ORAL
  Administered 2020-12-27 – 2021-01-01 (×16): 2 via ORAL
  Administered 2021-01-02: 1 via ORAL
  Administered 2021-01-02: 2 via ORAL
  Administered 2021-01-02: 1 via ORAL
  Administered 2021-01-03 – 2021-01-08 (×14): 2 via ORAL
  Filled 2020-12-08: qty 2
  Filled 2020-12-08: qty 1
  Filled 2020-12-08 (×5): qty 2
  Filled 2020-12-08: qty 1
  Filled 2020-12-08 (×2): qty 2
  Filled 2020-12-08: qty 1
  Filled 2020-12-08: qty 2
  Filled 2020-12-08 (×3): qty 1
  Filled 2020-12-08: qty 2
  Filled 2020-12-08: qty 1
  Filled 2020-12-08 (×4): qty 2
  Filled 2020-12-08 (×2): qty 1
  Filled 2020-12-08: qty 2
  Filled 2020-12-08: qty 1
  Filled 2020-12-08: qty 2
  Filled 2020-12-08: qty 1
  Filled 2020-12-08 (×2): qty 2
  Filled 2020-12-08: qty 1
  Filled 2020-12-08 (×5): qty 2
  Filled 2020-12-08: qty 1
  Filled 2020-12-08 (×5): qty 2
  Filled 2020-12-08 (×3): qty 1
  Filled 2020-12-08 (×2): qty 2
  Filled 2020-12-08: qty 1
  Filled 2020-12-08: qty 2
  Filled 2020-12-08: qty 1
  Filled 2020-12-08 (×2): qty 2
  Filled 2020-12-08: qty 1
  Filled 2020-12-08 (×2): qty 2
  Filled 2020-12-08: qty 1
  Filled 2020-12-08 (×2): qty 2
  Filled 2020-12-08: qty 1
  Filled 2020-12-08 (×8): qty 2
  Filled 2020-12-08: qty 1
  Filled 2020-12-08 (×2): qty 2
  Filled 2020-12-08: qty 1
  Filled 2020-12-08 (×8): qty 2
  Filled 2020-12-08: qty 1
  Filled 2020-12-08 (×4): qty 2

## 2020-12-08 MED ORDER — CYCLOSPORINE MODIFIED (NEORAL) 25 MG PO CAPS: 75.0000 mg | ORAL_CAPSULE | Freq: Two times a day (BID) | ORAL | Status: DC

## 2020-12-08 MED ORDER — ORAL CARE MOUTH RINSE
15.0000 mL | Freq: Two times a day (BID) | OROMUCOSAL | Status: DC
  Administered 2020-12-08 – 2021-01-08 (×59): 15 mL via OROMUCOSAL

## 2020-12-08 MED ORDER — CYCLOSPORINE 25 MG PO CAPS
75.0000 mg | ORAL_CAPSULE | Freq: Two times a day (BID) | ORAL | Status: DC
  Administered 2020-12-08: 75 mg via ORAL
  Filled 2020-12-08: qty 3

## 2020-12-08 MED ORDER — COLLAGENASE 250 UNIT/GM EX OINT
TOPICAL_OINTMENT | Freq: Every day | CUTANEOUS | Status: AC
  Administered 2020-12-08 – 2020-12-17 (×3): 1 via TOPICAL
  Filled 2020-12-08 (×3): qty 30

## 2020-12-08 MED ORDER — PANTOPRAZOLE SODIUM 40 MG PO TBEC
40.0000 mg | DELAYED_RELEASE_TABLET | Freq: Two times a day (BID) | ORAL | Status: DC
  Administered 2020-12-08 – 2021-01-08 (×63): 40 mg via ORAL
  Filled 2020-12-08 (×63): qty 1

## 2020-12-08 MED ORDER — SODIUM BICARBONATE 650 MG PO TABS
650.0000 mg | ORAL_TABLET | Freq: Two times a day (BID) | ORAL | Status: DC
  Administered 2020-12-08 – 2021-01-08 (×63): 650 mg via ORAL
  Filled 2020-12-08 (×63): qty 1

## 2020-12-08 MED ORDER — ACETAMINOPHEN 325 MG PO TABS
650.0000 mg | ORAL_TABLET | Freq: Four times a day (QID) | ORAL | Status: DC | PRN
  Administered 2020-12-16 – 2020-12-25 (×3): 650 mg via ORAL
  Filled 2020-12-08 (×4): qty 2

## 2020-12-08 MED ORDER — HYDROCORTISONE NA SUCCINATE PF 100 MG IJ SOLR
25.0000 mg | Freq: Three times a day (TID) | INTRAMUSCULAR | Status: DC
  Administered 2020-12-08 – 2020-12-10 (×6): 25 mg via INTRAVENOUS
  Filled 2020-12-08 (×6): qty 2

## 2020-12-08 MED ORDER — MUPIROCIN 2 % EX OINT
1.0000 "application " | TOPICAL_OINTMENT | Freq: Two times a day (BID) | CUTANEOUS | Status: AC
  Administered 2020-12-08 – 2020-12-13 (×10): 1 via NASAL
  Filled 2020-12-08 (×2): qty 22

## 2020-12-08 MED ORDER — SODIUM CHLORIDE 0.9 % IV SOLN
75.0000 mL/h | INTRAVENOUS | Status: AC
  Administered 2020-12-08: 75 mL/h via INTRAVENOUS

## 2020-12-08 NOTE — Progress Notes (Signed)
  Echocardiogram 2D Echocardiogram has been performed.  [name] 12/08/2020, 4:23 PM

## 2020-12-08 NOTE — ED Notes (Signed)
Lunch tray at bedside. ?

## 2020-12-08 NOTE — Progress Notes (Signed)
Inpatient Diabetes Program Recommendations  AACE/ADA: New Consensus Statement on Inpatient Glycemic Control (2015)  Target Ranges:  Prepandial:   less than 140 mg/dL      Peak postprandial:   less than 180 mg/dL (1-2 hours)      Critically ill patients:  140 - 180 mg/dL   Results for [name], [name] (MRN [id number]) as of 12/08/2020 11:11  Ref. Range 12/07/2020 20:24 12/08/2020 03:53 12/08/2020 07:43  Glucose-Capillary Latest Ref Range: 70 - 99 mg/dL 105 (H) 103 (H) 95   Results for [name], [name] (MRN [id number]) as of 12/08/2020 11:11  Ref. Range 09/26/2020 15:34  Hemoglobin A1C Latest Ref Range: 4.8 - 5.6 % 7.2 (H)    Presented from her SNF on 12/12 with reports of altered mental status in setting of increased drainage and odor from her sacral ulcer concerning for infection  History: Type 1 Diabetes (s/p pancreas transplant 1989), CKD  SNF DM Meds: Lantus 8 units BID    Humalog 2 units TID with meals  Current Orders: Lantus 8 units QHS      Novolog Sensitive Correction Scale/ SSI (0-9 units) Q4 hours    Per Chart Review, patient was using an Omni Pod Insulin pump back in October as outpatient for CBG control.  Was admitted 10/01 with DKA/ Cellulitis.  Was eventually discharged to [facility] 10/06/2020 and was started on Levemir and Novolog by discharging physician.  Has been admitted 11/03 and 11/13 as well.  Upon d/c back to SNF on 11/12/2020, was told to take Lantus 8 units BID + Admelog Insulin 2 units TID with meals.  Endocrinologist: Dr. Hartford Poli with [facility]--Initial consult (and last time seen) was 07/08/2020--Was using an Omni Pod Insulin pump with a Decom G6 sensor at that visit--Has since been taken off the Insulin pump since being moved to SNF   --Will follow patient during hospitalization--  Wyn Quaker RN, MSN, CDE Diabetes Coordinator Inpatient Glycemic Control Team Team Pager: [phone] (8a-5p)

## 2020-12-08 NOTE — Consult Note (Addendum)
Renal Service Consult Note [facility]  [name] 12/08/2020 Sol Blazing, MD Requesting Physician: Dr Lonny Prude  Reason for Consult: Transplant meds HPI: The patient is a 59 y.o. year-old w/ hx of DM2, ESRD sp renal/ panc transplant, R BKA for osteo, HTN, CKD 3 of transplant, sacral decub III, esophagitis and low thyroid. Pt presented from SNF for AMS. On exam and worsening sacral ulcer w/ yellowish drainage, temp 101F, diarrhea. No cough , no N/V or abd pain. Pt admitted for infected decub ulcer. Asked to see for help w/ transplant medications.   Pt tired and poor historian.   Pt here in Nov 2021 w/ AKI peak creat 3.0 on admit down to 1.26 on d/c on 11/10/20.    Pt has med list from Oct 2021 , [place] [facility] w/ imuran 50 /d, CyA at 75 bid and pred 5 qd.   ROS  denies CP  no joint pain   no HA  no blurry vision  no rash  no diarrhea  no nausea/ vomiting     Past Medical History  Past Medical History:  Diagnosis Date  . Anemia of chronic renal failure   . Diabetes mellitus without complication [facility])    Past Surgical History  Past Surgical History:  Procedure Laterality Date  . AMPUTATION Right 10/01/2020   Procedure: RIGHT BELOW KNEE AMPUTATION;  Surgeon: Newt Minion, MD;  Location: [facility];  Service: Orthopedics;  Laterality: Right;  . BLADDER SURGERY    . ESOPHAGOGASTRODUODENOSCOPY (EGD) WITH PROPOFOL N/A 11/10/2020   Procedure: ESOPHAGOGASTRODUODENOSCOPY (EGD) WITH PROPOFOL;  Surgeon: Irene Shipper, MD;  [facility];  Service: Endoscopy;  Laterality: N/A;  . KIDNEY TRANSPLANT    . LEG SURGERY     Family History  Family History  Problem Relation Age of Onset  . Hypertension Mother   . Hypertension Father    Social History  reports that she has never smoked. She has never used smokeless tobacco. She reports that she does not drink alcohol and does not use drugs. Allergies No Known Allergies Home medications Prior to Admission  medications   Medication Sig Start Date End Date Taking? Authorizing Provider  acetaminophen (TYLENOL) 325 MG tablet Take 2 tablets (650 mg total) by mouth every 6 (six) hours as needed for mild pain (or Fever >/= 101). Patient taking differently: Take 650 mg by mouth every 6 (six) hours as needed for mild pain or fever. 10/06/20  Yes Nita Sells, MD  azaTHIOprine (IMURAN) 50 MG tablet Take 50 mg by mouth daily. 08/23/20  Yes [provider]  collagenase (SANTYL) ointment Apply topically daily. 11/08/20  Yes Swayze, Ava, DO  cycloSPORINE (SANDIMMUNE) 25 MG capsule Take 75 mg by mouth 2 (two) times daily.   Yes [provider]  insulin glargine (LANTUS) 100 UNIT/ML injection Inject 0.08 mLs (8 Units total) into the skin 2 (two) times daily. 11/07/20  Yes Swayze, Ava, DO  insulin lispro (HUMALOG) 100 UNIT/ML injection Inject 2 Units into the skin 3 (three) times daily before meals.   Yes [provider]  metoprolol tartrate (LOPRESSOR) 25 MG tablet Take 12.5 mg by mouth 2 (two) times daily.  09/15/20  Yes [provider]  pantoprazole (PROTONIX) 40 MG tablet Take 1 tablet (40 mg total) by mouth 2 (two) times daily. 11/12/20  Yes Barb Merino, MD  rosuvastatin (CRESTOR) 5 MG tablet Take 1 tablet (5 mg total) by mouth at bedtime. 11/07/20  Yes Swayze, Ava, DO  sertraline (  ZOLOFT) 25 MG tablet Take 1 tablet (25 mg total) by mouth daily. 10/06/20  Yes Nita Sells, MD  sodium bicarbonate 650 MG tablet Take 1 tablet (650 mg total) by mouth 2 (two) times daily. 10/06/20  Yes Nita Sells, MD  torsemide (DEMADEX) 20 MG tablet Take 1 tablet (20 mg total) by mouth daily. 11/13/20  Yes Barb Merino, MD  traMADol (ULTRAM) 50 MG tablet Take 50 mg by mouth every 6 (six) hours as needed. 11/19/20  Yes [provider]     Vitals:   12/08/20 1400 12/08/20 1406 12/08/20 1430 12/08/20 1500  BP:  (!) 113/55  (!) 112/47  Pulse: 86 83 82 80   Resp:  12 14 14   Temp:      TempSrc:      SpO2: 98% 99% 97% 99%  Weight:       Exam Gen pt groggy , arouses and converses w/o difficulty Very chron ill w/ musc wasting and thin skin No rash, cyanosis or gangrene Sclera anicteric, throat clear No jvd or bruits Chest clear bilat to bases RRR no MRG Abd soft ntnd no mass or ascites +bs GU defer MS no joint effusions or deformity Ext L BKA, no LE edema, mult bruising Neuro is lethargic, arouses to voice, Ox 3, gen weakness is significant    Home meds:  - imuran 50 qd/ cyclosporine 75 bid  - lantus insulin 8u bid/ humalog 2u tid ac  - crestor 5/ sod bicarb bid  - demadex 20 qd/ metoprolol 12.5 bid  - zoloft 25  - prn's/ vitamins/ supplements     Na 131  BUN 76  Cr 1.78   Alb 1.9  Hb 8.3  WBC 4K      IMPRESSION: Sacral decubitus ulcer.  No evidence of osteomyelitis. Apparent renal transplant within the cul-de-sac of the pelvis. Mild caliectasis noted within the renal transplant. Urinary bladder is markedly distended to the level of the umbilicus. Aortoiliac atherosclerosis.        UA 12/12 - >50 wbc, 6-10 rbc, many bact, yeast ++, prot100  Assessment/ Plan: 1. AKI renal transplant - due to vol depletion and/or urinary bladder retention. CT showed markedly enlarged bladder. Hx of recent AKI in Nov 2021 w/ creat 3.0 >> 1.2 at dc.  Plan place foley cath, cont renal Tx IS meds (CyA, pred, imuran) and give IVF's as tolerated. Will follow.  2. Hx of renal /panc transplant  3. Infected sacral decub - on IV abx 4. DM 2 on insulin 5. FTT - 3 admits in last 60 day. Extremely frail on exam. Will consult PC team for [place].  6. HTN - may want to hold meds for 1-2 days 7. Anemia ckd       Kelly Splinter  MD 12/08/2020, 4:42 PM  Recent Labs  Lab 12/07/20 2010 12/08/20 0407  WBC 5.6 4.9  HGB 8.0* 8.3*   Recent Labs  Lab 12/07/20 2010 12/07/20 2301 12/08/20 0407  K 4.2  --  4.1  BUN 86*  --  76*  CREATININE 1.97*  --  1.78*   CALCIUM 8.9  --  8.7*  PHOS  --  3.9 3.3

## 2020-12-08 NOTE — ED Notes (Signed)
Writer spoke to pt's husband, [name]. Gave update regarding CT.

## 2020-12-08 NOTE — Evaluation (Signed)
SLP Cancellation Note  Patient Details Name: [name] MRN: [id number] DOB: 1961/05/10   Cancelled treatment:       Reason Eval/Treat Not Completed: Other (comment);Patient at procedure or test/unavailable (pt having an echo completed, will follow up next date for evaluation as ordered)   Macario Golds 12/08/2020, 3:54 PM  Doris Lime, MS [facility] SLP [place] Office [phone] Pager [phone]

## 2020-12-08 NOTE — Consult Note (Signed)
WOC Nurse Consult Note: Reason for Consult:Unstageable pressure injury to sacrum. Present on admission.  No surgical consult has been ordered. Will ask PT to evaluate for hydrotherapy for debridement.   Sepsis. On vancomycin. Scattered unstageable pressure injuries to right BKA stump along incision and posterior leg. Was wearing a shrinker.  Will implement topical Santyl for all.   Wound type: unstageable pressure injuries.  Pressure Injury POA: Yes Measurement: 4 cm x 3 cm cannot visualize wound bed to sacrum 1 cm eschar present to right stump along incision line and posterior leg.  Wound YOF:VWAQLRJP tissue Drainage (amount, consistency, odor) moderate purulence to sacrum.  Right BKA wounds dry.  Periwound:intact Dressing procedure/placement/frequency: Cleanse wounds to sacrum and along BKA stump site with NS.  Apply Santyl to wound bed. Cover with NS moist gauze. Secure with dry gauze and ABD pad.  Wrap stump with kerlix/tape. PT to evaluate for hydrotherapy.  Will not follow at this time.  Please re-consult if needed.  Domenic Moras MSN, RN, FNP-BC CWON Wound, Ostomy, Continence Nurse Pager [phone]

## 2020-12-08 NOTE — Progress Notes (Signed)
PT Cancellation Note  Patient Details Name: Nereyda Bowler MRN: [id number] DOB: 1961-09-19   Cancelled Treatment:     PT will initiate Hydrotherapy 12/09/20. Patient was seen by PT last admission  for same wound.  Claretha Cooper 12/08/2020, 2:09 PM [place] Pager [phone] Office [phone]

## 2020-12-08 NOTE — ED Notes (Signed)
Patient is resting comfortably. 

## 2020-12-08 NOTE — ED Notes (Signed)
Pt rounding complete. Pt resting comfortably with bed in lowest position and side rails up x2. Vitals updated. All needs met at this time. Will continue to monitor.

## 2020-12-08 NOTE — Progress Notes (Signed)
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   Subjective  Still having back pain.  Is not confused.  Able to give good history this morning. A & P   Altered mental status, concerning for acute metabolic encephalopathy like related to infected sacral ulcer, improved greatly.  Patient alert to self, place, time, context and gives excellent history -Continue to monitor neuro exam -Delirium precautions while in hospital  Hypotension on presentation maps less than 65 with a blood pressure of 83/50.  Differential includes dehydration, infectious related further exacerbated by BP meds, adrenal insufficiency given patient is on chronic prednisone.  Patient responded well to -Continue maintenance IV fluids x10 hours -Holding home Lopressor, torsemide  Cellulitis of sacral decubitus ulcer (present on admission) CT of pelvis shows no evidence of osteomyelitis but on physical exam obvious yellow/purulent exudate with surrounding erythema, elevated procalcitonin seeming consistent with infection.  Patient has no evidence of sepsis T-max only 99, no leukopenia or leukocytosis, did have some hypotension with maps less than 65 with no lactic acidosis -Continue  empiric vancomycin, cefepime, flagyl -Monitor blood cultures -Wound consulted  Bacteriuria and malodorous urine with acute urinary retention.  Patient does not report any urinary complaints, however given elevated procalcitonin, Ua consistent with infection and concern for infection due to above we will continue empiric antibiotics -Continue cefepime -Monitor urine culture --monitor output from foley  Possible pneumonia.  Patient denies cough.  No hypoxia.  Suspect chest x-ray is consistent with atelectasis versus pneumonia -If develops cough can obtain sputum culture-continue empiric antibiotics as I believe cellulitis from sacral ulcer and likely UTI are the biggest drivers of infection  AKI on CKD stage III s/p renal transplant (1989).  Baseline creatinine 1.2-1.3.  Peak of 1.97 on admission, likely in the setting of dehydration/blood pressure medications/infection i.e. prerenal already responding to IV fluids -Continue maintenance fluids -Monitor BMP -Avoid nephrotoxins -No pain at renal transplant site, given concern for infection will hold home Imuran and cephalosporin, consult nephrology -Continue home prednisone -Continue sodium bicarb  Macrocytic anemia, hemoglobin stable at baseline.  B12 and folate within normal limits.  No bleeding episodes. -Continue to monitor CBC  Type 1 diabetes status post post pancreas transplant (1989), poorly controlled with episodes of hypoglycemia as outpatient .  Last A1c 09/2020 7.2.  FBG in low 100s here. -Carb diet -Lantus 8 units nightly, slight scale insulin, monitor CBGs -Followed by endocrine as outpatient, apparently uses insulin pump, consult diabetes coordinator  Status post BKA (10/01/2020) for subacute osteomyelitis.  No obvious areas of infection. Has some slight erythema but no tenderness, no drainage, no fluctuance -Continue to monitor  Erosive esophagitis, stable.  Status post EGD on 11/10/2020 -Continue PPI twice  daily  Hypothyroidism, TSH stable -Continue home Synthroid  HLD, stable -Continue home statin  Depression, stable  on -continue home Zoloft     Family Communication  :  Called and left message at [phone]  Code Status : Full code, discussed discussed on day of admission  Disposition Plan  :  Patient is from SNF. Anticipated d/c date: 2 to 3 days. Barriers to d/c or necessity for inpatient status:  IV antibiotics for infection of sacral decubitus ulcer, also working up urine infection, also closely monitoring blood pressure Consults  : Wound  Procedures  : None  DVT Prophylaxis  :  SCDs   MDM: The below labs and imaging reports were reviewed and summarized above.  Medication management as above.  Lab Results  Component Value Date   PLT 210 12/08/2020    Diet :  Diet Order            Diet Carb Modified Fluid consistency: Thin; Room service appropriate? Yes  Diet effective now                  Inpatient Medications Scheduled Meds: . azaTHIOprine  50 mg Oral Daily  . collagenase   Topical Daily  . cycloSPORINE  75 mg Oral BID  . insulin aspart  0-9 Units Subcutaneous Q4H  . insulin glargine  8 Units Subcutaneous QHS  . levothyroxine  50 mcg Oral Q0600  . nystatin   Topical TID  . pantoprazole  40 mg Oral BID  . predniSONE  5 mg Oral Daily  . rosuvastatin  5 mg Oral QHS  . sertraline  25 mg Oral Daily  . sodium bicarbonate  650 mg Oral BID   Continuous Infusions: . sodium chloride 75 mL/hr (12/08/20 0329)  . ceFEPime (MAXIPIME) IV    . lactated ringers 150 mL/hr at 12/07/20 2212  . metronidazole 500 mg (12/08/20 0600)  . vancomycin     PRN Meds:.acetaminophen **OR** acetaminophen, HYDROcodone-acetaminophen  Antibiotics  :   Anti-infectives (From admission, onward)   Start     Dose/Rate Route Frequency Ordered Stop   12/08/20 2100  vancomycin (VANCOREADY) IVPB 500 mg/100 mL        500 mg 100 mL/hr over 60 Minutes Intravenous Every 24 hours 12/08/20  0040     12/08/20 2000  ceFEPIme (MAXIPIME) 2 g in sodium chloride 0.9 % 100 mL IVPB        2 g 200 mL/hr over 30 Minutes Intravenous Every 24 hours 12/08/20 0036     12/08/20 0600  metroNIDAZOLE (FLAGYL) IVPB 500 mg        500 mg 100 mL/hr over 60 Minutes Intravenous Every 8 hours 12/07/20 2248     12/07/20 2030  ceFEPIme (MAXIPIME) 2 g in sodium chloride 0.9 % 100 mL IVPB        2 g 200 mL/hr over 30 Minutes Intravenous  Once 12/07/20 2018 12/07/20 2105   12/07/20 2030  metroNIDAZOLE (FLAGYL) IVPB 500 mg        500 mg 100 mL/hr over 60 Minutes Intravenous  Once 12/07/20 2018 12/07/20 2321   12/07/20 2030  vancomycin (VANCOCIN) IVPB 1000 mg/200 mL premix        1,000 mg 200 mL/hr over 60 Minutes Intravenous  Once 12/07/20 2018 12/07/20 2212       Objective   Vitals:   12/08/20 0645 12/08/20 0800 12/08/20 0900 12/08/20 0930  BP:  130/71 130/72 120/71  Pulse: 80 84 86 87  Resp: 17 11 14 11   Temp:      TempSrc:      SpO2: 99%  98% 99% 99%  Weight:        SpO2: 99 %  Wt Readings from Last 3 Encounters:  12/07/20 52.2 kg  11/12/20 58.8 kg  11/02/20 64.4 kg     Intake/Output Summary (Last 24 hours) at 12/08/2020 0952 Last data filed at 12/07/2020 2354 Gross per 24 hour  Intake 2700 ml  Output 2000 ml  Net 700 ml    Physical Exam:     Awake Alert oriented to place, context, time, normal affect, following commands with no focal deficits [place].AT, Normal respiratory effort on air, CTAB RRR,No Gallops,Rubs or new Murmurs,  +ve B.Sounds, Abd Soft, No tenderness at site of new transplant, No rebound tenderness, no guarding or rigidity. R BKA stump      I have personally reviewed the following:   Data Reviewed:  CBC Recent Labs  Lab 12/07/20 2010 12/08/20 0407  WBC 5.6 4.9  HGB 8.0* 8.3*  HCT 25.1* 26.4*  PLT 246 210  MCV 104.6* 106.0*  MCH 33.3 33.3  MCHC 31.9 31.4  RDW 19.5* 19.5*  LYMPHSABS 1.0 1.2  MONOABS 0.3 0.3  EOSABS 0.0 0.1  BASOSABS 0.0  0.0    Chemistries  Recent Labs  Lab 12/07/20 2010 12/07/20 2301 12/08/20 0407  NA 132*  --  131*  K 4.2  --  4.1  CL 90*  --  91*  CO2 29  --  26  GLUCOSE 118*  --  106*  BUN 86*  --  76*  CREATININE 1.97*  --  1.78*  CALCIUM 8.9  --  8.7*  MG  --  1.9 1.8  AST 14*  --  13*  ALT 9  --  9  ALKPHOS 82  --  78  BILITOT 0.8  --  0.8   ------------------------------------------------------------------------------------------------------------------ No results for input(s): CHOL, HDL, LDLCALC, TRIG, CHOLHDL, LDLDIRECT in the last 72 hours.  Lab Results  Component Value Date   HGBA1C 7.2 (H) 09/26/2020   ------------------------------------------------------------------------------------------------------------------ Recent Labs    12/08/20 0408  TSH 33.751*   ------------------------------------------------------------------------------------------------------------------ Recent Labs    12/08/20 0407 12/08/20 0408  VITAMINB12  --  2,160*  FOLATE  --  8.5  FERRITIN  --  1,214*  TIBC  --  106*  IRON  --  69  RETICCTPCT 0.9  --     Coagulation profile Recent Labs  Lab 12/07/20 2010  INR 1.1    No results for input(s): DDIMER in the last 72 hours.  Cardiac Enzymes No results for input(s): CKMB, TROPONINI, MYOGLOBIN in the last 168 hours.  Invalid input(s): CK ------------------------------------------------------------------------------------------------------------------ No results found for: BNP  Micro Results Recent Results (from the past 240 hour(s))  Resp Panel by RT-PCR (Flu A&B, Covid) Nasopharyngeal Swab     Status: None   Collection Time: 12/07/20  8:57 PM   Specimen: Nasopharyngeal Swab; Nasopharyngeal(NP) swabs in vial transport medium  Result Value Ref Range Status   SARS Coronavirus 2 by RT PCR NEGATIVE NEGATIVE Final    Comment: (NOTE) SARS-CoV-2 target nucleic acids are NOT DETECTED.  The SARS-CoV-2 RNA is generally detectable in upper  respiratory specimens during the acute phase of infection. The lowest concentration of SARS-CoV-2 viral copies this assay can detect is 138 copies/mL. A negative result does not preclude SARS-Cov-2 infection and should not be used as the sole basis for treatment or other patient management decisions. A negative result may occur with  improper specimen collection/handling, submission of specimen other than nasopharyngeal swab, presence of viral  mutation(s) within the areas targeted by this assay, and inadequate number of viral copies(<138 copies/mL). A negative result must be combined with clinical observations, patient history, and epidemiological information. The expected result is Negative.  Fact Sheet for Patients:  EntrepreneurPulse.com.au  Fact Sheet for Healthcare Providers:  IncredibleEmployment.be  This test is no t yet approved or cleared by the [place] FDA and  has been authorized for detection and/or diagnosis of SARS-CoV-2 by FDA under an Emergency Use Authorization (EUA). This EUA will remain  in effect (meaning this test can be used) for the duration of the COVID-19 declaration under Section 564(b)(1) of the Act, 21 U.S.C.section 360bbb-3(b)(1), unless the authorization is terminated  or revoked sooner.       Influenza A by PCR NEGATIVE NEGATIVE Final   Influenza B by PCR NEGATIVE NEGATIVE Final    Comment: (NOTE) The Xpert Xpress SARS-CoV-2/FLU/RSV plus assay is intended as an aid in the diagnosis of influenza from Nasopharyngeal swab specimens and should not be used as a sole basis for treatment. Nasal washings and aspirates are unacceptable for Xpert Xpress SARS-CoV-2/FLU/RSV testing.  Fact Sheet for Patients: EntrepreneurPulse.com.au  Fact Sheet for Healthcare Providers: IncredibleEmployment.be  This test is not yet approved or cleared by the [place] FDA and has been  authorized for detection and/or diagnosis of SARS-CoV-2 by FDA under an Emergency Use Authorization (EUA). This EUA will remain in effect (meaning this test can be used) for the duration of the COVID-19 declaration under Section 564(b)(1) of the Act, 21 U.S.C. section 360bbb-3(b)(1), unless the authorization is terminated or revoked.  Performed at [facility], [address], [place], [place] [address]     Radiology Reports CT PELVIS WO CONTRAST  Result Date: 12/07/2020 CLINICAL DATA:  Sacral ulcer, concern for osteomyelitis EXAM: CT PELVIS WITHOUT CONTRAST TECHNIQUE: Multidetector CT imaging of the pelvis was performed following the standard protocol without intravenous contrast. COMPARISON:  None. FINDINGS: Urinary Tract: Urinary bladder is markedly distended to the level of the umbilicus. There appears to be a renal transplant kidney in the cul-de-sac of the pelvis. Mild caliectasis. Bowel:  Unremarkable visualized pelvic bowel loops. Vascular/Lymphatic: Heavily calcified aorta and iliac vessels. No adenopathy. Reproductive:  No visible pelvic mass. Other:  No free fluid or free air. Musculoskeletal: Sacral decubital ulcer noted. No bone destruction in the sacrum or coccyx to suggest osteomyelitis. Prior right hip replacement. IMPRESSION: Sacral decubitus ulcer.  No evidence of osteomyelitis. Apparent renal transplant within the cul-de-sac of the pelvis. Mild caliectasis noted within the renal transplant. Urinary bladder is markedly distended to the level of the umbilicus. Aortoiliac atherosclerosis. Electronically Signed   By: Rolm Baptise M.D.   On: 12/07/2020 21:51   DG Chest Port 1 View  Result Date: 12/07/2020 CLINICAL DATA:  Questionable sepsis - evaluate for abnormality Altered mental status. EXAM: PORTABLE CHEST 1 VIEW COMPARISON:  Radiograph 09/26/2020. FINDINGS: Lung volumes are low. There are streaky opacities at the left lung base with ill-defined left  hemidiaphragm. Stable heart size and mediastinal contours allowing for differences in positioning. No pulmonary edema. There is no pneumothorax or large pleural effusion. Remote right rib fractures. Bones are diffusely under mineralized. IMPRESSION: Low lung volumes with streaky opacities at the left lung base, which may represent atelectasis or pneumonia. Electronically Signed   By: Keith Rake M.D.   On: 12/07/2020 21:00     Time Spent in minutes  30     Desiree Hane M.D on 12/08/2020 at 9:52  AM  To page go to [email or web address] - password [facility]

## 2020-12-08 NOTE — Progress Notes (Signed)
PHARMACIST - PHYSICIAN COMMUNICATION  CONCERNING: Antibiotic IV to Oral Route Change Policy  RECOMMENDATION: This patient is receiving flagyl by the intravenous route.  Based on criteria approved by the Pharmacy and Therapeutics Committee, the antibiotic(s) is/are being converted to the equivalent oral dose form(s).   DESCRIPTION: These criteria include:  Patient being treated for a respiratory tract infection, urinary tract infection, cellulitis or clostridium difficile associated diarrhea if on metronidazole  The patient is not neutropenic and does not exhibit a GI malabsorption state  The patient is eating (either orally or via tube) and/or has been taking other orally administered medications for a least 24 hours  The patient is improving clinically and has a Tmax < 100.5  If you have questions about this conversion, please contact the Pharmacy Department  []  ( [phone] )  [place] []  ( [phone] )  [facility] []  ( [phone]  [place] []  ( [phone]  [facility] [x]  ( [phone] )  [facility]  

## 2020-12-09 ENCOUNTER — Encounter (HOSPITAL_COMMUNITY): Payer: Self-pay | Admitting: Internal Medicine

## 2020-12-09 ENCOUNTER — Other Ambulatory Visit: Payer: Self-pay

## 2020-12-09 DIAGNOSIS — E785 Hyperlipidemia, unspecified: Secondary | ICD-10-CM

## 2020-12-09 DIAGNOSIS — E1069 Type 1 diabetes mellitus with other specified complication: Secondary | ICD-10-CM

## 2020-12-09 DIAGNOSIS — Z515 Encounter for palliative care: Secondary | ICD-10-CM

## 2020-12-09 DIAGNOSIS — L0390 Cellulitis, unspecified: Secondary | ICD-10-CM

## 2020-12-09 DIAGNOSIS — Z7189 Other specified counseling: Secondary | ICD-10-CM

## 2020-12-09 DIAGNOSIS — E039 Hypothyroidism, unspecified: Secondary | ICD-10-CM

## 2020-12-09 LAB — PROCALCITONIN: Procalcitonin: 0.54 ng/mL

## 2020-12-09 LAB — BASIC METABOLIC PANEL
Anion gap: 12 (ref 5–15)
BUN: 73 mg/dL — ABNORMAL HIGH (ref 6–20)
CO2: 27 mmol/L (ref 22–32)
Calcium: 8.7 mg/dL — ABNORMAL LOW (ref 8.9–10.3)
Chloride: 96 mmol/L — ABNORMAL LOW (ref 98–111)
Creatinine, Ser: 1.79 mg/dL — ABNORMAL HIGH (ref 0.44–1.00)
GFR, Estimated: 32 mL/min — ABNORMAL LOW (ref >60)
Glucose, Bld: 131 mg/dL — ABNORMAL HIGH (ref 70–99)
Potassium: 4 mmol/L (ref 3.5–5.1)
Sodium: 135 mmol/L (ref 135–145)

## 2020-12-09 LAB — CBC
HCT: 24.4 % — ABNORMAL LOW (ref 36.0–46.0)
Hemoglobin: 7.8 g/dL — ABNORMAL LOW (ref 12.0–15.0)
MCH: 33.8 pg (ref 26.0–34.0)
MCHC: 32 g/dL (ref 30.0–36.0)
MCV: 105.6 fL — ABNORMAL HIGH (ref 80.0–100.0)
Platelets: 204 10*3/uL (ref 150–400)
RBC: 2.31 MIL/uL — ABNORMAL LOW (ref 3.87–5.11)
RDW: 19.4 % — ABNORMAL HIGH (ref 11.5–15.5)
WBC: 5 10*3/uL (ref 4.0–10.5)
nRBC: 0.4 % — ABNORMAL HIGH (ref 0.0–0.2)

## 2020-12-09 LAB — GLUCOSE, CAPILLARY
Glucose-Capillary: 107 mg/dL — ABNORMAL HIGH (ref 70–99)
Glucose-Capillary: 119 mg/dL — ABNORMAL HIGH (ref 70–99)
Glucose-Capillary: 252 mg/dL — ABNORMAL HIGH (ref 70–99)
Glucose-Capillary: 253 mg/dL — ABNORMAL HIGH (ref 70–99)
Glucose-Capillary: 259 mg/dL — ABNORMAL HIGH (ref 70–99)
Glucose-Capillary: 292 mg/dL — ABNORMAL HIGH (ref 70–99)

## 2020-12-09 LAB — CREATININE, URINE, RANDOM: Creatinine, Urine: 46.83 mg/dL

## 2020-12-09 LAB — SODIUM, URINE, RANDOM: Sodium, Ur: 13 mmol/L

## 2020-12-09 MED ORDER — ENSURE ENLIVE PO LIQD
237.0000 mL | Freq: Two times a day (BID) | ORAL | Status: DC
  Administered 2020-12-09 – 2021-01-08 (×54): 237 mL via ORAL

## 2020-12-09 MED ORDER — ADULT MULTIVITAMIN W/MINERALS CH
1.0000 | ORAL_TABLET | Freq: Every day | ORAL | Status: DC
  Administered 2020-12-09 – 2021-01-08 (×30): 1 via ORAL
  Filled 2020-12-09 (×31): qty 1

## 2020-12-09 MED ORDER — JUVEN PO PACK
1.0000 | PACK | Freq: Two times a day (BID) | ORAL | Status: DC
  Administered 2020-12-09 – 2021-01-08 (×42): 1 via ORAL
  Filled 2020-12-09 (×62): qty 1

## 2020-12-09 NOTE — TOC Initial Note (Signed)
Transition of Care [facility]) - Initial/Assessment Note    Patient Details  Name: [name] MRN: [id number] Date of Birth: 06-02-1961  Transition of Care [facility]) CM/SW Contact:    Leeroy Cha, RN Phone Number: 12/09/2020, 7:43 AM  Clinical Narrative:                 59 y.o. 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 Staff was concerned for possible worsening sacral decub infection given yellow drainage in order Patient has chronic stage III decubitus She did have some diarrhea and fevers at the facility up to 101 No associated cough chest pain no nausea vomiting no abdominal pain no urinary discomfort Patient has history of frequent admissions currently resides at SNF last admission was for esophagitis with coffee-ground emesis patient was discharged back to SNF on 12 November 2020 PLAN: return to [facility] Following for progression: Iv maxipime, iv vancoready, HGB-7.8 Pt is confused as to place and time. Expected Discharge Plan: Skilled Nursing Facility Barriers to Discharge: No Barriers Identified   Patient Goals and CMS Choice Patient states their goals for this hospitalization and ongoing recovery are:: confused unable to state      Expected Discharge Plan and Services Expected Discharge Plan: [place]   Discharge Planning Services: CM Consult Post Acute Care Choice: [place] Living arrangements for the past 2 months: [place]                                      Prior Living Arrangements/Services Living arrangements for the past 2  months: [place] Lives with:: Facility Resident Patient language and need for interpreter reviewed:: Yes        Need for Family Participation in Patient Care: Yes (Comment) Care giver support system in place?: Yes (comment)   Criminal Activity/Legal Involvement Pertinent to Current Situation/Hospitalization: No - Comment as needed  Activities of Daily Living Home Assistive Devices/Equipment: Blood pressure cuff,Grab bars around toilet,Grab bars in shower,Hand-held shower hose,Hospital bed,Hoyer Lift,Scales,Wheelchair,Other (Comment),CBG Meter (foley catheter) ADL Screening (condition at time of admission) Patient's cognitive ability adequate to safely complete daily activities?: No Is the patient deaf or have difficulty hearing?: No Does the patient have difficulty seeing, even when wearing glasses/contacts?: Yes (blind in left eye) Does the patient have difficulty concentrating, remembering, or making decisions?: Yes Patient able to express need for assistance with ADLs?: Yes Does the patient have difficulty dressing or bathing?: Yes Independently performs ADLs?: No Communication: Independent Dressing (OT): Dependent Is this a change from baseline?: Pre-admission baseline Grooming: Dependent Is this a change from baseline?: Pre-admission baseline Feeding: Dependent Is this a change from baseline?: Pre-admission baseline Bathing: Dependent Is this a change from baseline?: Pre-admission baseline Toileting: Dependent Is this a change from baseline?: Pre-admission baseline In/Out Bed: Dependent Is this a change from baseline?: Pre-admission baseline Walks in Home: Dependent Is this a change from baseline?: Pre-admission baseline Does the patient have difficulty walking or climbing stairs?: Yes (secondary to right BKA) Weakness of Legs: Left Weakness of Arms/Hands: None  Permission Sought/Granted  Emotional Assessment Appearance:: Appears  stated age Attitude/Demeanor/Rapport: Unable to Assess Affect (typically observed): Unable to Assess Orientation: : Fluctuating Orientation (Suspected and/or reported [place]) Alcohol / Substance Use: Not Applicable Psych Involvement: No (comment)  Admission diagnosis:  Wound infection [T14.8XXA, L08.9] Sepsis ([place]) [A41.9] Patient Active Problem List   Diagnosis Date Noted  . Acute metabolic encephalopathy 56/21/3086  . Sacral decubitus ulcer, stage III ([place]) 12/08/2020  . Cellulitis 12/08/2020  . Hypotension 12/08/2020  . Malodorous urine 12/08/2020  . Erosive esophagitis 12/08/2020  . Hypothyroid 12/08/2020  . Depression 12/08/2020  . Type 1 diabetes mellitus with hyperlipidemia ([place]) 12/08/2020  . Sepsis ([place]) 12/07/2020  . Hypoalbuminemia 12/07/2020  . Hyponatremia 12/07/2020  . CAP (community acquired pneumonia) 12/07/2020  . Acute urinary retention 12/07/2020  . Coffee ground emesis   . Gastroesophageal reflux disease with esophagitis and hemorrhage   . Long-term use of immunosuppressant medication   . GI bleed 11/08/2020  . AKI (acute kidney injury) ([place]) 10/29/2020  . Gangrene of right foot ([place])   . Subacute osteomyelitis of right foot ([place])   . Type 1 diabetes mellitus with complication, with long term current use of insulin pump ([place]) 09/27/2020  . Pressure injury of skin 09/27/2020  . Diabetic ketoacidosis ([place]) 09/26/2020  . Acute kidney injury superimposed on CKD ([place]) 09/26/2020  . Cellulitis and abscess of right leg 09/26/2020  . 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 History of simultaneous kidney and pancreas transplant ([place]) 05/14/2020  . Chronic kidney disease (CKD) stage G3a/A1, moderately decreased glomerular filtration rate (GFR) between 45-59 mL/min/1.73 square meter and albuminuria creatinine ratio less than 30 mg/g ([facility]) 05/14/2020  . Hypertension 05/14/2020   PCP:  [name], MD Pharmacy:   [id number] - [place],  [place] [address] [place] [address] Phone: [phone] Fax: [phone]     Social Determinants of Health (SDOH) Interventions    Readmission Risk Interventions No flowsheet data found.

## 2020-12-09 NOTE — Evaluation (Signed)
Occupational Therapy Evaluation Patient Details Name: [name] MRN: [id number] DOB: [birth year] Today's Date: 12/09/2020    History of Present Illness 59 y.o. 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   Clinical Impression   Patient from SNF, reports unable to get OOB due to painful sacral wound. Patient require total A x2 for bed mobility and unable to tolerate sitting EOB more than ~15 seconds due to painful buttock. Patient has required max to total A for self cares at SNF for months, appears since R LE amputation. Patient unable to tolerate/participate in OT at this time, will discontinue acute OT services. Please re-consult if new needs arise, currently recommend D/C back to SNF as patient requires 24/7 assist.     Follow Up Recommendations  Other (comment) (return to SNF)    Equipment Recommendations  None recommended by OT       Precautions / Restrictions Precautions Precautions: Fall Precaution Comments: sacral decubitis Restrictions Weight Bearing Restrictions: No      Mobility Bed Mobility Overal bed mobility: Needs Assistance Bed Mobility: Supine to Sit;Sit to Supine     Supine to sit: Total assist;+2 for physical assistance Sit to supine: Total assist;+2 for physical assistance   General bed mobility comments: patient unable to initiate bed mobility, required max A to even upright trunk from L lateral lean in bed before bed mobility initiated.    Transfers                 General transfer comment: unable, would need lift    Balance Overall balance assessment: Needs assistance Sitting-balance support: Bilateral upper extremity supported Sitting  balance-Leahy Scale: Zero Sitting balance - Comments: unable to tolerate sitting up at EOB due to pain, required max A and B UE support                                   ADL either performed or assessed with clinical judgement   ADL Overall ADL's : At baseline                                       General ADL Comments: patient has been requiring significant assistance at SNF for self cares, patient unable to tolerate sitting up at EOB for more than ~15 seconds reporting too much pain                  Pertinent Vitals/Pain Pain Assessment: Faces Faces Pain Scale: Hurts worst Pain Location: bottom Pain Descriptors / Indicators: Guarding;Grimacing Pain Intervention(s): Limited activity within patient's tolerance     Hand Dominance Right   Extremity/Trunk Assessment Upper Extremity Assessment Upper Extremity Assessment: Generalized weakness   Lower Extremity Assessment Lower Extremity Assessment: Defer to PT evaluation       Communication Communication Communication: No difficulties   Cognition Arousal/Alertness: Awake/alert Behavior During Therapy: WFL for tasks assessed/performed Overall Cognitive Status: No family/caregiver present to determine baseline cognitive functioning  General Comments: patient with poor insight into medical complexities              Home Living Family/patient expects to be discharged to:: Skilled nursing facility                                        Prior Functioning/Environment Level of Independence: Needs assistance  Gait / Transfers Assistance Needed: patient reports inability to get to chair at facility due to sacral decubitis ADL's / Homemaking Assistance Needed: patient max to total A for dressing, bathing, toileting at SNF            OT Problem List: Pain;Decreased activity tolerance         OT Goals(Current goals can be  found in the care plan section) Acute Rehab OT Goals Patient Stated Goal: "it hurts" OT Goal Formulation: All assessment and education complete, DC therapy             Co-evaluation PT/OT/SLP Co-Evaluation/Treatment: Yes Reason for Co-Treatment: For patient/therapist safety;To address functional/ADL transfers   OT goals addressed during session: ADL's and self-care      AM-PAC OT "6 Clicks" Daily Activity     Outcome Measure Help from another person eating meals?: A Little Help from another person taking care of personal grooming?: A Little Help from another person toileting, which includes using toliet, bedpan, or urinal?: Total Help from another person bathing (including washing, rinsing, drying)?: Total Help from another person to put on and taking off regular upper body clothing?: Total Help from another person to put on and taking off regular lower body clothing?: Total 6 Click Score: 10   End of Session Nurse Communication: Other (comment) (RN present for eval)  Activity Tolerance: Patient limited by pain Patient left: in bed  OT Visit Diagnosis: Pain Pain - part of body:  (buttock)                Time: 4742-5956 OT Time Calculation (min): 10 min Charges:  OT General Charges $OT Visit: 1 Visit OT Evaluation $OT Eval Low Complexity: 1 Low  Delbert Phenix OT OT pager: [phone]   Rosemary Holms 12/09/2020, 11:55 AM

## 2020-12-09 NOTE — Progress Notes (Signed)
   12/09/20  Hydrotherapy evaluation [id number]     Subjective you do what you need to  Patient and Family Stated Goals agreed to wound care  Date of Onset  (present on admission)  Prior Treatments PLS during last admission with dressing changes  Evaluation and Treatment  Evaluation and Treatment Procedures Explained to Patient/Family Yes  Evaluation and Treatment Procedures agreed to  Wound / Incision (Open or Dehisced) 12/09/20 Other (Comment) Sacrum Medial open nonstageable sacral wound, malodorous with slough.  Date First Assessed: 12/09/20   Wound Type: Other (Comment)  Location: Sacrum  Location Orientation: Medial  Wound Description (Comments): open nonstageable sacral wound, malodorous with slough.  Dressing Type Foam - Lift dressing to assess site every shift;Gauze (Comment);Moist to moist  Dressing Changed New  Dressing Status Clean  Dressing Change Frequency Daily  Site / Wound Assessment Friable;Painful  % Wound base Red or Granulating 25%  % Wound base Black/Eschar 75%  Peri-wound Assessment Erythema (blanchable);Edema  Wound Length (cm) 3.5 cm  Wound Width (cm) 3 cm  Wound Depth (cm) 1.5 cm  Wound Volume (cm^3) 15.75 cm^3  Wound Surface Area (cm^2) 10.5 cm^2  Tunneling (cm) 3.5 at 12:00  Undermining (cm) 2-3 cm (from 7:00 to 3:00)  Margins Unattached edges (unapproximated)  Drainage Amount Moderate  Drainage Description Purulent;Green;Odor  Non-staged Wound Description Full thickness  Treatment Debridement (Selective);Hydrotherapy (Pulse lavage);Packing (Saline gauze) (santyl)  Hydrotherapy  Pulsed Lavage with Suction (psi) 8 psi  Pulsed Lavage with Suction - Normal Saline Used 1000 mL  Pulsed Lavage Tip Tip with splash shield  Pulsed lavage therapy - wound location sacrum  Selective Debridement  Selective Debridement - Location sacrum  Selective Debridement - Tools Used Forceps;Scissors  Selective Debridement - Tissue Removed slough and eschar  Wound  Therapy - Assess/Plan/Recommendations  Wound Therapy - Clinical Statement Patient comes from SNF, this wound was treated during recent hospitalization. Wound now apppears with more slough and necrotic tissue. patient tolerated treatment well, premdicated for pain. Patient has  H/O RBKA.Patient will benefit from Hydrotherapy to decrease necrotic tissue,  improving  garanulation tissue, and promote healing.  Wound Therapy - Functional Problem List bed bound, R BKA  Factors Delaying/Impairing Wound Healing Incontinence;Multiple medical problems  Hydrotherapy Plan Debridement;Dressing change;Pulsatile lavage with suction  Wound Therapy - Frequency 6X / week  Wound Therapy - Current Recommendations Case manager/social work  Wound Therapy - Follow Up Recommendations Skilled nursing facility  Wound Plan PLS, debridement, dressing change  Wound Therapy Goals - Improve the function of patient's integumentary system by progressing the wound(s) through the phases of wound healing by:  Decrease Necrotic Tissue to 25  Decrease Necrotic Tissue - Progress Goal set today  Increase Granulation Tissue to 75  Increase Granulation Tissue - Progress Goal set today  Patient/Family will be able to  reposition on sides  Patient/Family Instruction Goal - Progress Goal set today  Goals/treatment plan/discharge plan were made with and agreed upon by patient/family Yes  Time For Goal Achievement 2 weeks  Wound Therapy - Potential for Goals Fair  [place] Pager [phone] Office [phone]

## 2020-12-09 NOTE — Progress Notes (Signed)
Initial Nutrition Assessment  DOCUMENTATION CODES:   Non-severe (moderate) malnutrition in context of chronic illness  INTERVENTION:  - will order Ensure Enlive BID, each supplement provides 350 kcal and 20 grams of protein. - will order 1 tablet multivitamin with minerals/day. - will order  Juven BID, each packet provides 95 calories, 2.5 grams of protein (collagen), and 9.8 grams of carbohydrate (3 grams sugar); also contains 7 grams of L-arginine and L-glutamine, 300 mg vitamin C, 15 mg vitamin E, 1.2 mcg vitamin B-12, 9.5 mg zinc, 200 mg calcium, and 1.5 g  Calcium Beta-hydroxy- Beta-methylbutyrate to support wound healing.   NUTRITION DIAGNOSIS:   Moderate Malnutrition related to chronic illness (stage 3 CKD) as evidenced by mild fat depletion,mild muscle depletion,moderate muscle depletion.  GOAL:   Patient will meet greater than or equal to 90% of their needs  MONITOR:   PO intake,Supplement acceptance,Labs,Weight trends  REASON FOR ASSESSMENT:   Consult Assessment of nutrition requirement/status  ASSESSMENT:   59 year old female with medical history of chronic stage 3 pressure injury, multiple L heel wounds, type 1 DM, renal and pancreas transplant (1989), stage 3 CKD, HTN, prior R BKA d/t osteomyelitis. She was hospitalized from 11/13-11/17 due to coffee-ground emesis and was found to have severe esophagitis. She was then discharged back to SNF and again presented to the ED on 12/12 with AMS and increased drainage and odor from sacral wound with concern for infection.  No intakes documented since admission. Patient would prefer not to receive oatmeal or grits d/t not liking the way they are prepared here. She enjoys sliced peaches; will have them provided at all meals.  She reports that at SNF/rehab, a lot of fried foods are served. She typically limits fried foods and this makes it difficult for her to be able to eat in the way she would prefer at the facility.  Her  appetite was poor for 3-4 days PTA. Last night she was napping and when she woke up, dinner of meatloaf and mac and cheese had been delivered and she ate 100% of the meal.   Patient has been experiencing sacral/lower back pain which is now nearly resolved with pain medication and turning. Pain never got bad enough that it led to nausea.   Patient reports that she has been losing weight over the past several months, in part d/t not eating as well at SNF/rehab.   Weight on 12/12 was 115 lb, which appears to be a stated weight. Weight on 11/17 was 129 lb which indicates 14 lb weight loss (10.8% body weight) in the past 1 month; significant for time frame. Weight on 10/11 was 122 lb which indicates 7 lb weight loss (5.7% body weight) in the past 2 months; not significant for time frame.   Patient requests Ensure supplement. She has tried Juven in the past and got nauseated after trying it the first time so she has avoided it, but is open to trying it again during this admission.    Labs reviewed; CBGs: 119 and 107 mg/dl, Cl: 96 mmol/l, BUN: 73 mg/dl, creatinine: 1.79 mg/dl, Ca: 8.7 mg/dl, GFR: 32 ml/min.  Medications reviewed; 25 mg solu-cortef TID, sliding scale novolog, 8 units lantus/day, 50 mcg oral synthroid/day, 40 mg oral protonix BID, 650 mg sodium bicarb BID.  IVF; NS @ 50 ml/hr.    NUTRITION - FOCUSED PHYSICAL EXAM:  Flowsheet Row Most Recent Value  Orbital Region Mild depletion  Upper Arm Region Moderate depletion  Thoracic and Lumbar Region Unable to  assess  Buccal Region Mild depletion  Temple Region Mild depletion  Clavicle Bone Region Moderate depletion  Clavicle and Acromion Bone Region Moderate depletion  Scapular Bone Region Unable to assess  Dorsal Hand Mild depletion  Patellar Region Moderate depletion  Anterior Thigh Region Moderate depletion  Posterior Calf Region Moderate depletion  Edema (RD Assessment) Moderate  [RLE]  Hair Reviewed  Eyes Reviewed  Mouth  Reviewed  Skin Reviewed  Nails Reviewed       Diet Order:   Diet Order            Diet Carb Modified Fluid consistency: Thin; Room service appropriate? Yes  Diet effective now                 EDUCATION NEEDS:   No education needs have been identified at this time  Skin:  Skin Assessment: Skin Integrity Issues: Skin Integrity Issues:: Unstageable,Other (Comment) Unstageable: to sacrum ("4 cm x 3 cm cannot visualize wound bed; necrotic tissue at wound bed" reported by [place] in note) Other: MASD to bilateral buttocks  Last BM:  12/14 (type 1)  Height:   Ht Readings from Last 1 Encounters:  12/09/20 _0  (1.6 m)    Weight:   Wt Readings from Last 1 Encounters:  12/07/20 52.2 kg     Estimated Nutritional Needs:  Kcal:  1825-2090 kcal Protein:  90-105 grams Fluid:  >/= 2 L/day     Jarome Matin, MS, RD, LDN, CNSC Inpatient Clinical Dietitian RD pager # available in [place]  After hours/weekend pager # available in [facility]

## 2020-12-09 NOTE — Progress Notes (Signed)
[facility] Progress Note  Subjective: seen in ICU, very frail, more alert today c/t yesterday  Vitals:   12/09/20 0600 12/09/20 0700 12/09/20 0800 12/09/20 0900  BP: 135/79 122/79 124/74 (!) 141/67  Pulse:  94 96 93  Resp: (!) 21 14 11 12   Temp:   98 F (36.7 C)   TempSrc:   Axillary   SpO2:  99% 98% 99%  Weight:      Height: 5\' 3"  (1.6 m)       Exam:   Frail, responsive and pleasant  no jvd  Chest cta bilat  Cor reg no RG  Abd soft ntnd no ascites   Ext L bka, no leg edema, mult bruises   Neuro gen'd weakness, nonfocal       Home meds:  - imuran 50 qd/ cyclosporine 75 bid  - lantus insulin 8u bid/ humalog 2u tid ac  - crestor 5/ sod bicarb bid  - demadex 20 qd/ metoprolol 12.5 bid  - zoloft 25  - prn's/ vitamins/ supplements     IMPRESSION: Sacral decubitus ulcer. No evidence of osteomyelitis. Apparent renal transplant within the cul-de-sac of the pelvis. Mild caliectasis noted within the renal transplant. Urinary bladder is markedly distended to the level of the umbilicus. Aortoiliac atherosclerosis.        UA 12/12 - >50 wbc, 6-10 rbc, many bact, yeast ++, prot100  Assessment/ Plan: 1. AKI renal transplant - b/l creat 1.1- 1.5 from 2021. Hx of recent AKI in Nov 2021 w/ creat 3.0 >> 1.2 at dc.  AKI here due to vol depletion and/or urinary bladder retention. CT showed markedly enlarged bladder. Foley cath placed, creat unchanged today at 1.7. Good UOP. Will cont IVF"s.  2. Hx of renal /panc transplant - cont renal Tx IS meds (CyA, pred, imuran) 3. Infected sacral decub - on IV abx 4. DM 2 on insulin 5. FTT - 3 admits in last 60 day. Extremely frail on exam. GOC pall consult ordered.  6. HTN - BP's okay 7. Anemia ckd / chronic illness - Hb ~8    [name] 12/09/2020, 12:58 PM   Recent Labs  Lab 12/07/20 2301 12/08/20 0407 12/09/20 0224  K  --  4.1 4.0  BUN  --  76* 73*  CREATININE  --  1.78* 1.79*  CALCIUM  --  8.7* 8.7*  PHOS 3.9  3.3  --   HGB  --  8.3* 7.8*   Inpatient medications: . azaTHIOprine  50 mg Oral Daily  . Chlorhexidine Gluconate Cloth  6 each Topical Daily  . Chlorhexidine Gluconate Cloth  6 each Topical Q0600  . collagenase   Topical Daily  . cycloSPORINE  75 mg Oral BID  . feeding supplement  237 mL Oral BID BM  . hydrocortisone sod succinate (SOLU-CORTEF) inj  25 mg Intravenous Q8H  . insulin aspart  0-9 Units Subcutaneous Q4H  . insulin glargine  8 Units Subcutaneous QHS  . levothyroxine  50 mcg Oral Q0600  . mouth rinse  15 mL Mouth Rinse BID  . metroNIDAZOLE  500 mg Oral Q8H  . multivitamin with minerals  1 tablet Oral Daily  . mupirocin ointment  1 application Nasal BID  . nutrition supplement (JUVEN)  1 packet Oral BID BM  . nystatin   Topical TID  . pantoprazole  40 mg Oral BID  . rosuvastatin  5 mg Oral QHS  . sertraline  25 mg Oral Daily  . sodium bicarbonate  650 mg  Oral BID   . sodium chloride    . ceFEPime (MAXIPIME) IV Stopped (12/08/20 2107)  . vancomycin Stopped (12/08/20 2250)   acetaminophen **OR** acetaminophen, HYDROcodone-acetaminophen

## 2020-12-09 NOTE — Progress Notes (Signed)
[facility]  PROGRESS NOTE  [name] MMH:[id number] DOB: [birth year] DOA: 12/07/2020 PCP: Doris Croak, MD Admit date - 12/07/2020   Admitting Physician Doris Baker, MD  Outpatient Primary MD for the patient is Doris Croak, MD  LOS - 2 Brief Narrative  [name] is a 59 year old female with medical history significant for chronic sacral decubitus ulcer stage III and multiple left heel ulcerations type 1 diabetes, renal and pancreas transplant(1989) on prednisone stage III CKD, HTN, right BKA for OM and recent hospitalization from 11/13-11/17 for coffee-ground emesis found to have severe esophagitis who presented from her SNF on 12/12 with reports of altered mental status in setting of increased drainage and odor from her sacral ulcer concerning for infection.   Subjective  Reports improvement in back pain today.  No cough, no chest pain, no shortness of breath A & P   Altered mental status, concerning for acute metabolic encephalopathy like related to infected sacral ulcer, resolved.  Patient alert to self, place, time, context and gives excellent history -Continue to monitor neuro exam -Delirium precautions while in hospital  Hypotension on presentation (maps less than 65 with a blood pressure of 83/50).  Differential includes dehydration, infectious related further exacerbated by BP meds, adrenal insufficiency given patient is on chronic prednisone.  Patient responded well to IV fluids and maintaining normotensive pressures.  Suspect will likely related to dehydration -Continue maintenance IV fluids at low rate -Holding home Lopressor, torsemide  Cellulitis of sacral decubitus ulcer (present on admission) CT of pelvis shows no evidence of osteomyelitis but on physical exam obvious yellow/purulent exudate with surrounding erythema, elevated procalcitonin seeming consistent with infection.  Patient has no evidence of sepsis T-max only 99, no leukopenia or  leukocytosis, did have some hypotension with maps less than 65 with no lactic acidosis on admission -Continue empiric vancomycin, cefepime, flagyl -Monitor blood cultures -Wound consulted, PT starting hydrotherapy  Bacteriuria and malodorous urine with acute urinary retention.  Patient does not report any urinary complaints, however given elevated procalcitonin, Ua consistent with infection and concern for infection due to above we will continue empiric antibiotics -Continue cefepime -Monitor urine culture --monitor output from foley  Possible pneumonia cited on CXR.  Patient denies cough.  No hypoxia.  Suspect chest x-ray is consistent with atelectasis versus pneumonia -If develops cough can obtain sputum culture -continue empiric antibiotics as I believe cellulitis from sacral ulcer and likely UTI are the biggest drivers of infection  AKI on CKD stage III s/p renal transplant (1989), slowly improving.  Baseline creatinine 1.2-1.3.  Peak of 1.97 on admission now 1.7, likely in the setting of dehydration/blood pressure medications/infection and further exacerbated by urinary retention i.e. prerenal already responding to IV fluids -Continue maintenance fluids at low rate of 50 cc/h -Monitor BMP -Avoid nephrotoxins -No pain at renal transplant site, appreciate nephrology recommendations, will continue home Imuran, prednisone and cyclosporine -Continue sodium bicarb  Macrocytic anemia, hemoglobin stable at baseline.  B12 and folate within normal limits.  No bleeding episodes. -Continue to monitor CBC  Type 1 diabetes status post post pancreas transplant (1989), poorly controlled with episodes of hypoglycemia as outpatient .  Last A1c 09/2020 7.2.  FBG in low 100s here.  Insulin pump removed since being at SNF and has been on Lantus regimen -Carb diet -Lantus 8 units nightly, slight scale insulin, monitor CBGs -Followed by endocrine as outpatient, apparently uses insulin pump, diabetes  coordinator following  Status post BKA (10/01/2020) for subacute osteomyelitis.  No obvious  areas of infection. Has some slight erythema but no tenderness, no drainage, no fluctuance -Continue to monitor  Erosive esophagitis, stable.  Status post EGD on 11/10/2020 -Continue PPI twice daily  Hypothyroidism, TSH stable -Continue home Synthroid  HLD, stable -Continue home statin  Depression, stable  -continue home Zoloft     Family Communication  :  Called and left message at [phone]  Code Status : Full code, discussed discussed on day of admission  Disposition Plan  :  Patient is from SNF. Anticipated d/c date: 2 to 3 days. Barriers to d/c or necessity for inpatient status:  IV antibiotics for infection of sacral decubitus ulcer, also working up urine infection, also closely monitoring blood pressure, given overall clinical stability will transfer out of stepdown unit to progressive unit Consults  : Wound, nephrology  Procedures  : None  DVT Prophylaxis  :  SCDs   MDM: The below labs and imaging reports were reviewed and summarized above.  Medication management as above.  Lab Results  Component Value Date   PLT 204 12/09/2020    Diet :  Diet Order            Diet Carb Modified Fluid consistency: Thin; Room service appropriate? Yes  Diet effective now                  Inpatient Medications Scheduled Meds: . azaTHIOprine  50 mg Oral Daily  . Chlorhexidine Gluconate Cloth  6 each Topical Daily  . Chlorhexidine Gluconate Cloth  6 each Topical Q0600  . collagenase   Topical Daily  . cycloSPORINE  75 mg Oral BID  . feeding supplement  237 mL Oral BID BM  . hydrocortisone sod succinate (SOLU-CORTEF) inj  25 mg Intravenous Q8H  . insulin aspart  0-9 Units Subcutaneous Q4H  . insulin glargine  8 Units Subcutaneous QHS  . levothyroxine  50 mcg Oral Q0600  . mouth rinse  15 mL Mouth Rinse BID  . metroNIDAZOLE  500 mg Oral Q8H  . multivitamin with minerals  1  tablet Oral Daily  . mupirocin ointment  1 application Nasal BID  . nutrition supplement (JUVEN)  1 packet Oral BID BM  . nystatin   Topical TID  . pantoprazole  40 mg Oral BID  . rosuvastatin  5 mg Oral QHS  . sertraline  25 mg Oral Daily  . sodium bicarbonate  650 mg Oral BID   Continuous Infusions: . sodium chloride    . ceFEPime (MAXIPIME) IV Stopped (12/08/20 2107)  . vancomycin Stopped (12/08/20 2250)   PRN Meds:.acetaminophen **OR** acetaminophen, HYDROcodone-acetaminophen  Antibiotics  :   Anti-infectives (From admission, onward)   Start     Dose/Rate Route Frequency Ordered Stop   12/08/20 2100  vancomycin (VANCOREADY) IVPB 500 mg/100 mL        500 mg 100 mL/hr over 60 Minutes Intravenous Every 24 hours 12/08/20 0040     12/08/20 2000  ceFEPIme (MAXIPIME) 2 g in sodium chloride 0.9 % 100 mL IVPB        2 g 200 mL/hr over 30 Minutes Intravenous Every 24 hours 12/08/20 0036     12/08/20 1400  metroNIDAZOLE (FLAGYL) tablet 500 mg        500 mg Oral Every 8 hours 12/08/20 1049     12/08/20 0600  metroNIDAZOLE (FLAGYL) IVPB 500 mg  Status:  Discontinued        500 mg 100 mL/hr over 60 Minutes Intravenous Every 8 hours 12/07/20  2248 12/08/20 1049   12/07/20 2030  ceFEPIme (MAXIPIME) 2 g in sodium chloride 0.9 % 100 mL IVPB        2 g 200 mL/hr over 30 Minutes Intravenous  Once 12/07/20 2018 12/07/20 2105   12/07/20 2030  metroNIDAZOLE (FLAGYL) IVPB 500 mg        500 mg 100 mL/hr over 60 Minutes Intravenous  Once 12/07/20 2018 12/07/20 2321   12/07/20 2030  vancomycin (VANCOCIN) IVPB 1000 mg/200 mL premix        1,000 mg 200 mL/hr over 60 Minutes Intravenous  Once 12/07/20 2018 12/07/20 2212       Objective   Vitals:   12/09/20 0600 12/09/20 0700 12/09/20 0800 12/09/20 0900  BP: 135/79 122/79 124/74 (!) 141/67  Pulse:  94 96 93  Resp: (!) 21 14 11 12   Temp:   98 F (36.7 C)   TempSrc:   Axillary   SpO2:  99% 98% 99%  Weight:      Height: 5\' 3"  (1.6 m)        SpO2: 99 %  Wt Readings from Last 3 Encounters:  12/07/20 52.2 kg  11/12/20 58.8 kg  11/02/20 64.4 kg     Intake/Output Summary (Last 24 hours) at 12/09/2020 1631 Last data filed at 12/09/2020 0100 Gross per 24 hour  Intake 196.12 ml  Output 200 ml  Net -3.88 ml    Physical Exam:     Awake Alert oriented to place, context, time, normal affect, following commands with no focal deficits [place].AT, Normal respiratory effort on air, CTAB RRR,No Gallops,Rubs or new Murmurs,  +ve B.Sounds, Abd Soft, No tenderness at site of kidney transplant, No rebound tenderness, no guarding or rigidity. R BKA stump  (From 12/13)      I have personally reviewed the following:   Data Reviewed:  CBC Recent Labs  Lab 12/07/20 2010 12/08/20 0407 12/09/20 0224  WBC 5.6 4.9 5.0  HGB 8.0* 8.3* 7.8*  HCT 25.1* 26.4* 24.4*  PLT 246 210 204  MCV 104.6* 106.0* 105.6*  MCH 33.3 33.3 33.8  MCHC 31.9 31.4 32.0  RDW 19.5* 19.5* 19.4*  LYMPHSABS 1.0 1.2  --   MONOABS 0.3 0.3  --   EOSABS 0.0 0.1  --   BASOSABS 0.0 0.0  --     Chemistries  Recent Labs  Lab 12/07/20 2010 12/07/20 2301 12/08/20 0407 12/09/20 0224  NA 132*  --  131* 135  K 4.2  --  4.1 4.0  CL 90*  --  91* 96*  CO2 29  --  26 27  GLUCOSE 118*  --  106* 131*  BUN 86*  --  76* 73*  CREATININE 1.97*  --  1.78* 1.79*  CALCIUM 8.9  --  8.7* 8.7*  MG  --  1.9 1.8  --   AST 14*  --  13*  --   ALT 9  --  9  --   ALKPHOS 82  --  78  --   BILITOT 0.8  --  0.8  --    ------------------------------------------------------------------------------------------------------------------ No results for input(s): CHOL, HDL, LDLCALC, TRIG, CHOLHDL, LDLDIRECT in the last 72 hours.  Lab Results  Component Value Date   HGBA1C 8.6 (H) 12/08/2020   ------------------------------------------------------------------------------------------------------------------ Recent Labs    12/08/20 0408  TSH 33.751*    ------------------------------------------------------------------------------------------------------------------ Recent Labs    12/08/20 0407 12/08/20 0408  VITAMINB12  --  2,160*  FOLATE  --  8.5  FERRITIN  --  1,214*  TIBC  --  106*  IRON  --  69  RETICCTPCT 0.9  --     Coagulation profile Recent Labs  Lab 12/07/20 2010  INR 1.1    No results for input(s): DDIMER in the last 72 hours.  Cardiac Enzymes No results for input(s): CKMB, TROPONINI, MYOGLOBIN in the last 168 hours.  Invalid input(s): CK ------------------------------------------------------------------------------------------------------------------ No results found for: BNP  Micro Results Recent Results (from the past 240 hour(s))  Urine culture     Status: Abnormal   Collection Time: 12/07/20  8:06 PM   Specimen: Urine, Catheterized  Result Value Ref Range Status   Specimen Description   Final    URINE, CATHETERIZED Performed at [facility] [address], [place], [place] [address]    Special Requests   Final    NONE Performed at [facility], [place] [address] [place], [place] [address]    Culture 90,000 COLONIES/mL YEAST (A)  Final   Report Status 12/08/2020 FINAL  Final  Blood culture (routine single)     Status: None (Preliminary result)   Collection Time: 12/07/20  8:10 PM   Specimen: BLOOD  Result Value Ref Range Status   Specimen Description   Final    BLOOD BLOOD LEFT FOREARM Performed at [facility] [address] [place], [place] [address]    Special Requests   Final    BOTTLES DRAWN AEROBIC AND ANAEROBIC Blood Culture results may not be optimal due to an excessive volume of blood received in culture bottles Performed at [facility] [address], [place], [place] [address]    Culture   Final    NO GROWTH 1 DAY Performed at [facility], [place] [address] [place], [place] [address]    Report  Status PENDING  Incomplete  Resp Panel by RT-PCR (Flu A&B, Covid) Nasopharyngeal Swab     Status: None   Collection Time: 12/07/20  8:57 PM   Specimen: Nasopharyngeal Swab; Nasopharyngeal(NP) swabs in vial transport medium  Result Value Ref Range Status   SARS Coronavirus 2 by RT PCR NEGATIVE NEGATIVE Final    Comment: (NOTE) SARS-CoV-2 target nucleic acids are NOT DETECTED.  The SARS-CoV-2 RNA is generally detectable in upper respiratory specimens during the acute phase of infection. The lowest concentration of SARS-CoV-2 viral copies this assay can detect is 138 copies/mL. A negative result does not preclude SARS-Cov-2 infection and should not be used as the sole basis for treatment or other patient management decisions. A negative result may occur with  improper specimen collection/handling, submission of specimen other than nasopharyngeal swab, presence of viral mutation(s) within the areas targeted by this assay, and inadequate number of viral copies(<138 copies/mL). A negative result must be combined with clinical observations, patient history, and epidemiological information. The expected result is Negative.  Fact Sheet for Patients:  EntrepreneurPulse.com.au  Fact Sheet for Healthcare Providers:  IncredibleEmployment.be  This test is no t yet approved or cleared by the [place] FDA and  has been authorized for detection and/or diagnosis of SARS-CoV-2 by FDA under an Emergency Use Authorization (EUA). This EUA will remain  in effect (meaning this test can be used) for the duration of the COVID-19 declaration under Section 564(b)(1) of the Act, 21 U.S.C.section 360bbb-3(b)(1), unless the authorization is terminated  or revoked sooner.       Influenza A by PCR NEGATIVE NEGATIVE Final   Influenza B by PCR NEGATIVE NEGATIVE Final  Comment: (NOTE) The Xpert Xpress SARS-CoV-2/FLU/RSV plus assay is intended as an aid in the diagnosis  of influenza from Nasopharyngeal swab specimens and should not be used as a sole basis for treatment. Nasal washings and aspirates are unacceptable for Xpert Xpress SARS-CoV-2/FLU/RSV testing.  Fact Sheet for Patients: EntrepreneurPulse.com.au  Fact Sheet for Healthcare Providers: IncredibleEmployment.be  This test is not yet approved or cleared by the [place] FDA and has been authorized for detection and/or diagnosis of SARS-CoV-2 by FDA under an Emergency Use Authorization (EUA). This EUA will remain in effect (meaning this test can be used) for the duration of the COVID-19 declaration under Section 564(b)(1) of the Act, 21 U.S.C. section 360bbb-3(b)(1), unless the authorization is terminated or revoked.  Performed at [facility], [place] [address], [place], [place] [address]   MRSA PCR Screening     Status: Abnormal   Collection Time: 12/08/20  1:53 PM   Specimen: Nasopharyngeal  Result Value Ref Range Status   MRSA by PCR POSITIVE (A) NEGATIVE Final    Comment:        The GeneXpert MRSA Assay (FDA approved for NASAL specimens only), is one component of a comprehensive MRSA colonization surveillance program. It is not intended to diagnose MRSA infection nor to guide or monitor treatment for MRSA infections. RESULT CALLED TO, READ BACK BY AND VERIFIED WITH: ROBINSON,A. RN @1614  12/08/20 BILLINGSLEY,L Performed at [facility], [place] [address] [place], [place] [address]     Radiology Reports CT PELVIS WO CONTRAST  Result Date: 12/07/2020 CLINICAL DATA:  Sacral ulcer, concern for osteomyelitis EXAM: CT PELVIS WITHOUT CONTRAST TECHNIQUE: Multidetector CT imaging of the pelvis was performed following the standard protocol without intravenous contrast. COMPARISON:  None. FINDINGS: Urinary Tract: Urinary bladder is markedly distended to the level of the umbilicus. There appears to be a renal  transplant kidney in the cul-de-sac of the pelvis. Mild caliectasis. Bowel:  Unremarkable visualized pelvic bowel loops. Vascular/Lymphatic: Heavily calcified aorta and iliac vessels. No adenopathy. Reproductive:  No visible pelvic mass. Other:  No free fluid or free air. Musculoskeletal: Sacral decubital ulcer noted. No bone destruction in the sacrum or coccyx to suggest osteomyelitis. Prior right hip replacement. IMPRESSION: Sacral decubitus ulcer.  No evidence of osteomyelitis. Apparent renal transplant within the cul-de-sac of the pelvis. Mild caliectasis noted within the renal transplant. Urinary bladder is markedly distended to the level of the umbilicus. Aortoiliac atherosclerosis. Electronically Signed   By: Rolm Baptise M.D.   On: 12/07/2020 21:51   DG Chest Port 1 View  Result Date: 12/07/2020 CLINICAL DATA:  Questionable sepsis - evaluate for abnormality Altered mental status. EXAM: PORTABLE CHEST 1 VIEW COMPARISON:  Radiograph 09/26/2020. FINDINGS: Lung volumes are low. There are streaky opacities at the left lung base with ill-defined left hemidiaphragm. Stable heart size and mediastinal contours allowing for differences in positioning. No pulmonary edema. There is no pneumothorax or large pleural effusion. Remote right rib fractures. Bones are diffusely under mineralized. IMPRESSION: Low lung volumes with streaky opacities at the left lung base, which may represent atelectasis or pneumonia. Electronically Signed   By: Keith Rake M.D.   On: 12/07/2020 21:00   ECHOCARDIOGRAM COMPLETE  Result Date: 12/08/2020    ECHOCARDIOGRAM REPORT   Patient Name:   RAJA CAPUTI Date of Exam: 12/08/2020 Medical Rec #:  [id number]       Height:       63.0 in Accession #:    [id number]  Weight:       115.0 lb Date of Birth:  1961/10/07      BSA:          1.528 m Patient Age:    58 years        BP:           130/71 mmHg Patient Gender: F               HR:           85 bpm. Exam Location:  Inpatient  Procedure: 2D Echo, Color Doppler and Cardiac Doppler Indications:    R94.31 Abnormal EKG  History:        Patient has no prior history of Echocardiogram examinations.                 Risk Factors:Diabetes.  Sonographer:    Bernadene Person RDCS Referring Phys: [place]  1. Left ventricular ejection fraction, by estimation, is 65 to 70%. The left ventricle has normal function. The left ventricle has no regional wall motion abnormalities. There is moderate left ventricular hypertrophy. Left ventricular diastolic parameters are consistent with Grade I diastolic dysfunction (impaired relaxation).  2. Right ventricular systolic function is normal. The right ventricular size is normal. Mildly increased right ventricular wall thickness. Tricuspid regurgitation signal is inadequate for assessing PA pressure.  3. The mitral valve is normal in structure. Trivial mitral valve regurgitation. No evidence of mitral stenosis.  4. The aortic valve was not well visualized. Aortic valve regurgitation is trivial. No aortic stenosis is present.  5. The inferior vena cava is normal in size with greater than 50% respiratory variability, suggesting right atrial pressure of 3 mmHg.  6. A small pericardial effusion is present. FINDINGS  Left Ventricle: Left ventricular ejection fraction, by estimation, is 65 to 70%. The left ventricle has normal function. The left ventricle has no regional wall motion abnormalities. The left ventricular internal cavity size was normal in size. There is  moderate left ventricular hypertrophy. Left ventricular diastolic parameters are consistent with Grade I diastolic dysfunction (impaired relaxation). Right Ventricle: The right ventricular size is normal. Mildly increased right ventricular wall thickness. Right ventricular systolic function is normal. Tricuspid regurgitation signal is inadequate for assessing PA pressure. Left Atrium: Left atrial size was normal in size. Right  Atrium: Right atrial size was normal in size. Pericardium: A small pericardial effusion is present. Mitral Valve: The mitral valve is normal in structure. Trivial mitral valve regurgitation. No evidence of mitral valve stenosis. Tricuspid Valve: The tricuspid valve is normal in structure. Tricuspid valve regurgitation is not demonstrated. Aortic Valve: The aortic valve was not well visualized. Aortic valve regurgitation is trivial. Aortic regurgitation PHT measures 520 msec. No aortic stenosis is present. Pulmonic Valve: The pulmonic valve was normal in structure. Pulmonic valve regurgitation is not visualized. Aorta: The aortic root is normal in size and structure. Venous: The inferior vena cava is normal in size with greater than 50% respiratory variability, suggesting right atrial pressure of 3 mmHg. IAS/Shunts: No atrial level shunt detected by color flow Doppler.  LEFT VENTRICLE PLAX 2D LVIDd:         2.50 cm  Diastology LVIDs:         1.20 cm  LV e' medial:    3.59 cm/s LV PW:         1.70 cm  LV E/e' medial:  15.8 LV IVS:        1.70 cm  LV e'  lateral:   3.81 cm/s LVOT diam:     1.80 cm  LV E/e' lateral: 14.9 LV SV:         48 LV SV Index:   31 LVOT Area:     2.54 cm  RIGHT VENTRICLE RV S prime:     18.30 cm/s TAPSE (M-mode): 1.4 cm LEFT ATRIUM             Index      RIGHT ATRIUM          Index LA diam:        3.30 cm 2.16 cm/m RA Area:     3.33 cm LA Vol (A2C):   11.4 ml 7.46 ml/m RA Volume:   3.61 ml  2.36 ml/m LA Vol (A4C):   8.6 ml  5.62 ml/m LA Biplane Vol: 10.1 ml 6.61 ml/m  AORTIC VALVE LVOT Vmax:   96.70 cm/s LVOT Vmean:  70.700 cm/s LVOT VTI:    0.188 m AI PHT:      520 msec  AORTA Ao Root diam: 3.30 cm Ao Asc diam:  2.60 cm MITRAL VALVE MV Area (PHT): 2.56 cm    SHUNTS MV Decel Time: 296 msec    Systemic VTI:  0.19 m MV E velocity: 56.60 cm/s  Systemic Diam: 1.80 cm MV A velocity: 72.00 cm/s MV E/A ratio:  0.79 Loralie Champagne MD Electronically signed by Loralie Champagne MD Signature Date/Time:  12/08/2020/5:15:53 PM    Final      Time Spent in minutes  30     Desiree Hane M.D on 12/09/2020 at 4:31 PM  To page go to [email or web address] - password [facility]

## 2020-12-09 NOTE — Evaluation (Signed)
Physical Therapy Evaluation Patient Details Name: [name] MRN: [id number] DOB: Jul 02, 1961 Today's Date: 12/09/2020   History of Present Illness  59 y.o. 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  Clinical Impression  The patient is essentially at her baseline as mobility  Status, requiring total assistance. Patient has a painful sacral ulcer that limits  Sitting. PT will Sign off.    Follow Up Recommendations No PT follow up Patient comes from SNF.    Equipment Recommendations  None recommended by PT    Recommendations for Other Services       Precautions / Restrictions Precautions Precautions: Fall Precaution Comments: sacral decubitis, RBKA      Mobility  Bed Mobility Overal bed mobility: Needs Assistance Bed Mobility: Supine to Sit;Sit to Supine     Supine to sit: Total assist;+2 for physical assistance Sit to supine: Total assist;+2 for physical assistance   General bed mobility comments: patient unable to initiate bed mobility, required max A to even upright trunk from L lateral lean in bed before bed mobility initiated.    Transfers                 General transfer comment: unable, would need lift  Ambulation/Gait                Stairs            Wheelchair Mobility    Modified Rankin (Stroke Patients Only)       Balance Overall balance assessment: Needs assistance Sitting-balance support: Bilateral upper extremity supported Sitting balance-Leahy Scale: Zero Sitting balance - Comments: unable to tolerate sitting up at EOB due to pain, required max A and B UE support                                      Pertinent Vitals/Pain Pain Assessment: Faces Pain Score: 7  Faces Pain Scale: Hurts worst Pain Location: bottom Pain Descriptors / Indicators: Guarding;Grimacing Pain Intervention(s): Monitored during session    Home Living Family/patient expects to be discharged to:: Skilled nursing facility                      Prior Function Level of Independence: Needs assistance   Gait / Transfers Assistance Needed: patient reports inability to get to chair at facility due to sacral decubitis  ADL's / Homemaking Assistance Needed: patient max to total A for dressing, bathing, toileting at SNF        Hand Dominance   Dominant Hand: Right    Extremity/Trunk Assessment   Upper Extremity Assessment Upper Extremity Assessment: Generalized weakness    Lower Extremity Assessment Lower Extremity Assessment: Generalized weakness;RLE deficits/detail RLE Deficits / Details: BKA    Cervical / Trunk Assessment Cervical / Trunk Assessment: Kyphotic  Communication   Communication: No difficulties  Cognition Arousal/Alertness: Awake/alert Behavior During Therapy: WFL for tasks assessed/performed Overall Cognitive Status: No family/caregiver present to determine baseline cognitive functioning                                 General Comments: patient with poor insight into medical  complexities      General Comments      Exercises     Assessment/Plan    PT Assessment    PT Problem List Decreased strength;Decreased mobility;Decreased activity tolerance;Decreased balance;Decreased coordination;Decreased cognition;Decreased range of motion;Decreased skin integrity       PT Treatment Interventions      PT Goals (Current goals can be found in the Care Plan section)  Acute Rehab PT Goals Patient Stated Goal: i  am going home PT Goal Formulation: All assessment and education complete, DC therapy    Frequency     Barriers to discharge         Co-evaluation PT/OT/SLP Co-Evaluation/Treatment: Yes Reason for Co-Treatment: For patient/therapist safety;To address functional/ADL transfers PT goals addressed during session: Mobility/safety with mobility OT goals addressed during session: ADL's and self-care       AM-PAC PT "6 Clicks" Mobility  Outcome Measure Help needed turning from your back to your side while in a flat bed without using bedrails?: Total Help needed moving from lying on your back to sitting on the side of a flat bed without using bedrails?: Total Help needed moving to and from a bed to a chair (including a wheelchair)?: Total Help needed standing up from a chair using your arms (e.g., wheelchair or bedside chair)?: Total Help needed to walk in hospital room?: Total Help needed climbing 3-5 steps with a railing? : Total 6 Click Score: 6    End of Session   Activity Tolerance: Patient limited by pain Patient left: in bed;with call bell/phone within reach;with bed alarm set Nurse Communication: Mobility status PT Visit Diagnosis: Difficulty in walking, not elsewhere classified (R26.2)    Time: 6948-5462 PT Time Calculation (min) (ACUTE ONLY): 10 min   Charges:   PT Evaluation $PT Eval Low Complexity: [place] Pager [phone] Office [phone]   Claretha Cooper 12/09/2020, 1:36 PM

## 2020-12-09 NOTE — Evaluation (Signed)
Clinical/Bedside Swallow Evaluation Patient Details  Name: [name] MRN: [id number] Date of Birth: 1961-09-16  Today's Date: 12/09/2020 Time: SLP Start Time (ACUTE ONLY): 1045 SLP Stop Time (ACUTE ONLY): 1120 SLP Time Calculation (min) (ACUTE ONLY): 35 min  Past Medical History:  Past Medical History:  Diagnosis Date  . Anemia of chronic renal failure   . Diabetes mellitus without complication [facility])    Past Surgical History:  Past Surgical History:  Procedure Laterality Date  . AMPUTATION Right 10/01/2020   Procedure: RIGHT BELOW KNEE AMPUTATION;  Surgeon: Newt Minion, MD;  Location: [facility];  Service: Orthopedics;  Laterality: Right;  . BLADDER SURGERY    . ESOPHAGOGASTRODUODENOSCOPY (EGD) WITH PROPOFOL N/A 11/10/2020   Procedure: ESOPHAGOGASTRODUODENOSCOPY (EGD) WITH PROPOFOL;  Surgeon: Irene Shipper, MD;  Location: [facility];  Service: Endoscopy;  Laterality: N/A;  . KIDNEY TRANSPLANT    . LEG SURGERY     HPI:  pt is a 59 yo female with complex hx including HTN, DM1 s/p pancreatic and kidney transplants approx 10 years ago, BKA due to osteomyelitis now adm with sepsis. Potential UTI and cellulitis.  Pt was at a rehab facility prior to admission.  Swallow evaluation ordered. She does admit to having to masticate well and take food/pills with plenty of water otherwise she will sense food/pills lodging in throat.  Pt admits to occasionally coughing up pill or food alone but states this is very infrequent.  She has taken pills with puree before when receiving dialysis due to fluid limitation but reports ease of taking medicine with liquid is similar.  GERD on occasion reported by pt for which she will take OTC Antacids.  CXR 12/07/2020 showed Low lung volumes with streaky opacities at the left lung base, which may represent atelectasis or pneumonia.   Assessment / Plan / Recommendation Clinical Impression  Pt did not pass 3 ounce Yale water challenge due to cough immediately  post-swallow. However CN exam is unremarkable and pt denies frequent coughing with intake.  No further episodes of coughing with intake noted with thin, applesauce nor graham crackers.  Swallow appeared swift with laryngeal elevation clinically observed.  She does admit to having to masticate well and take food/pills with plenty of water otherwise she will sense food/pills lodging in throat.    Advised her that pharyngeal sensation may be referrant from distal esophagus and regardless adequate clearance is important.  She appeared xerostomic and admits this is an ongoing issue for her. Pt admits to occasionally coughing up pill or food (not with frothy secretions) but states this is very infrequent.  She has taken pills with puree before when receiving dialysis due to fluid limitation but reports ease of taking medicine with liquid is similar and liquid administration is her preference.   Pt admits she will cough when taking liquids to clear food particles from oral cavity, thus SLP advised she use purees or something with consistency to help clear to see if prevents coughing.  Also advised pt to maintain strength of cough for airway protection and stay partially upright for 30 minutes after meals.  No SlP follow up indicated as pt educated to potential compensation strategies to mitigate dysphagia.  Thanks for this consult. SLP Visit Diagnosis: Dysphagia, unspecified (R13.10)    Aspiration Risk  Mild aspiration risk    Diet Recommendation Regular;Thin liquid   Liquid Administration via: Cup;Straw Medication Administration: Whole meds with liquid Supervision: Patient able to self feed Compensations: Slow rate;Small sips/bites (use puree, etc  to clear foor particles from oral cavity) Postural Changes: Remain upright for at least 30 minutes after po intake;Seated upright at 90 degrees    Other  Recommendations Oral Care Recommendations: Oral care BID   Follow up Recommendations None      Frequency  and Duration   n/a         Prognosis    n/a    Swallow Study   General HPI: pt is a 59 yo female with complex hx including HTN, DM1 s/p pancreatic and kidney transplants approx 10 years ago, BKA due to osteomyelitis now adm with sepsis. Potential UTI and cellulitis.  Pt was at a rehab facility prior to admission.  Swallow evaluation ordered. She does admit to having to masticate well and take food/pills with plenty of water otherwise she will sense food/pills lodging in throat.  Pt admits to occasionally coughing up pill or food alone but states this is very infrequent.  She has taken pills with puree before when receiving dialysis due to fluid limitation but reports ease of taking medicine with liquid is similar.  GERD on occasion reported by pt for which she will take OTC Antacids.  CXR 12/07/2020 showed Low lung volumes with streaky opacities at the left lung base, which may represent atelectasis or pneumonia. Type of Study: Bedside Swallow Evaluation Previous Swallow Assessment: none in EPIC Diet Prior to this Study: Regular;Thin liquids (carb mod) Temperature Spikes Noted: No Respiratory Status: Room air History of Recent Intubation: No Behavior/Cognition: Alert;Cooperative;Pleasant mood Oral Cavity Assessment: Within Functional Limits Oral Care Completed by SLP: No Oral Cavity - Dentition: Adequate natural dentition Vision: Functional for self-feeding Self-Feeding Abilities: Able to feed self Patient Positioning: Other (comment) (elevated side lying) Baseline Vocal Quality: Normal Volitional Cough: Strong Volitional Swallow: Able to elicit    Oral/Motor/Sensory Function Overall Oral Motor/Sensory Function: Within functional limits   Ice Chips Ice chips: Not tested   Thin Liquid Thin Liquid: Impaired Presentation: Straw;Self Fed Pharyngeal  Phase Impairments: Cough - Immediate Other Comments: cough immediately post-swallow with Yale 3 ounce water challenge, this was not  redemonstrated with small single sips of water via straw    Nectar Thick Nectar Thick Liquid: Not tested   Honey Thick Honey Thick Liquid: Not tested   Puree Puree: Within functional limits Presentation: Spoon   Solid     Solid: Within functional limits Presentation: Self Fed Other Comments: pt complains of dry mouth - adequate oral clearance noted      Macario Golds 12/09/2020,11:54 AM  Lorenzo Lime, MS [place] Office [phone] Pager [phone]

## 2020-12-10 DIAGNOSIS — E43 Unspecified severe protein-calorie malnutrition: Secondary | ICD-10-CM

## 2020-12-10 DIAGNOSIS — E440 Moderate protein-calorie malnutrition: Secondary | ICD-10-CM

## 2020-12-10 LAB — CK: Total CK: 10 U/L — ABNORMAL LOW (ref 38–234)

## 2020-12-10 LAB — COMPREHENSIVE METABOLIC PANEL
ALT: 8 U/L (ref 0–44)
AST: 15 U/L (ref 15–41)
Albumin: 1.8 g/dL — ABNORMAL LOW (ref 3.5–5.0)
Alkaline Phosphatase: 73 U/L (ref 38–126)
Anion gap: 11 (ref 5–15)
BUN: 76 mg/dL — ABNORMAL HIGH (ref 6–20)
CO2: 25 mmol/L (ref 22–32)
Calcium: 8.4 mg/dL — ABNORMAL LOW (ref 8.9–10.3)
Chloride: 95 mmol/L — ABNORMAL LOW (ref 98–111)
Creatinine, Ser: 1.7 mg/dL — ABNORMAL HIGH (ref 0.44–1.00)
GFR, Estimated: 34 mL/min — ABNORMAL LOW (ref >60)
Glucose, Bld: 271 mg/dL — ABNORMAL HIGH (ref 70–99)
Potassium: 4.1 mmol/L (ref 3.5–5.1)
Sodium: 131 mmol/L — ABNORMAL LOW (ref 135–145)
Total Bilirubin: 0.7 mg/dL (ref 0.3–1.2)
Total Protein: 4.8 g/dL — ABNORMAL LOW (ref 6.5–8.1)

## 2020-12-10 LAB — CBC
HCT: 22.1 % — ABNORMAL LOW (ref 36.0–46.0)
Hemoglobin: 7 g/dL — ABNORMAL LOW (ref 12.0–15.0)
MCH: 34 pg (ref 26.0–34.0)
MCHC: 31.7 g/dL (ref 30.0–36.0)
MCV: 107.3 fL — ABNORMAL HIGH (ref 80.0–100.0)
Platelets: 175 10*3/uL (ref 150–400)
RBC: 2.06 MIL/uL — ABNORMAL LOW (ref 3.87–5.11)
RDW: 19.3 % — ABNORMAL HIGH (ref 11.5–15.5)
WBC: 4.9 10*3/uL (ref 4.0–10.5)
nRBC: 0.4 % — ABNORMAL HIGH (ref 0.0–0.2)

## 2020-12-10 LAB — GLUCOSE, CAPILLARY
Glucose-Capillary: 196 mg/dL — ABNORMAL HIGH (ref 70–99)
Glucose-Capillary: 229 mg/dL — ABNORMAL HIGH (ref 70–99)
Glucose-Capillary: 287 mg/dL — ABNORMAL HIGH (ref 70–99)
Glucose-Capillary: 310 mg/dL — ABNORMAL HIGH (ref 70–99)
Glucose-Capillary: 316 mg/dL — ABNORMAL HIGH (ref 70–99)
Glucose-Capillary: 331 mg/dL — ABNORMAL HIGH (ref 70–99)

## 2020-12-10 LAB — LEGIONELLA PNEUMOPHILA SEROGP 1 UR AG: L. pneumophila Serogp 1 Ur Ag: NEGATIVE

## 2020-12-10 MED ORDER — SODIUM CHLORIDE 0.9 % IV SOLN
300.0000 mg | Freq: Every day | INTRAVENOUS | Status: AC
  Administered 2020-12-10 – 2020-12-14 (×5): 300 mg via INTRAVENOUS
  Filled 2020-12-10 (×5): qty 6

## 2020-12-10 MED ORDER — PREDNISONE 5 MG PO TABS
5.0000 mg | ORAL_TABLET | Freq: Every day | ORAL | Status: DC
  Administered 2020-12-11 – 2020-12-23 (×13): 5 mg via ORAL
  Filled 2020-12-10 (×14): qty 1

## 2020-12-10 NOTE — Progress Notes (Signed)
Pharmacy Antibiotic Note  [name] is a 59 y.o. female admitted on 12/07/2020 with sepsis.  Pharmacy has been consulted for Cefepime dosing. Continue flagyl, we will change patients vancomycin to daptomycin with her current kidney function and history of renal transplant.   Will dose daptomycin q24h since renal function seems to be recovering and anticipate further drop in Scr, she is right on the borderline for q24 vs q48. Monitor closely and change as indicated.   Plan: Cefepime 2gm q24 Flagyl 500mg  IV q8 Stop Vancomycin Start Daptomycin 300 mg IV q24h Baseline CK  SCr daily Height: 5\' 3"  (160 cm) Weight: 52.2 kg (115 lb) IBW/kg (Calculated) : 52.4  Temp (24hrs), Avg:98.1 F (36.7 C), Min:97.6 F (36.4 C), Max:98.6 F (37 C)  Recent Labs  Lab 12/07/20 2010 12/07/20 2015 12/08/20 0407 12/09/20 0224 12/10/20 0236  WBC 5.6  --  4.9 5.0 4.9  CREATININE 1.97*  --  1.78* 1.79* 1.70*  LATICACIDVEN  --  1.8  --   --   --     Estimated Creatinine Clearance: 29 mL/min (A) (by C-G formula based on SCr of 1.7 mg/dL (H)).    No Known Allergies  Antimicrobials this admission: 12/12 Cefepime >>  12/12 Vancomycin >> 12/15 12/15 Daptomycin >>  12/12 Flagyl >>  Dose adjustments this admission:  Microbiology results: 12/12 BCx x 1set: sent UCx: ordered  1212 MRSA PCR: neg  Thank you for allowing pharmacy to be a part of this patient's care.  Phillis Haggis PharmD 12/10/2020 9:47 AM

## 2020-12-10 NOTE — Progress Notes (Signed)
[facility] Progress Note  Subjective: seen in ICU, alert today. Creat down minimally to 1.70, UOP 850 cc.  No new c/o's.   Vitals:   12/10/20 0300 12/10/20 0500 12/10/20 0600 12/10/20 0800  BP: (!) 146/74 126/61 (!) 157/80   Pulse: 71 76 82   Resp: 12 14 10    Temp:  97.6 F (36.4 C)  98.1 F (36.7 C)  TempSrc:  Oral  Oral  SpO2: 100% 95% 98%   Weight:      Height:        Exam:   Frail, responsive and pleasant  no jvd  Chest cta bilat  Cor reg no RG  Abd soft ntnd no ascites   Ext L bka, 1-2+ edema today, mult bruises   Neuro gen'd weakness, nonfocal       Home meds:  - imuran 50 qd/ cyclosporine 75 bid  - lantus insulin 8u bid/ humalog 2u tid ac  - crestor 5/ sod bicarb bid  - demadex 20 qd/ metoprolol 12.5 bid  - zoloft 25  - prn's/ vitamins/ supplements     IMPRESSION: Sacral decubitus ulcer. No evidence of osteomyelitis. Apparent renal transplant within the cul-de-sac of the pelvis. Mild caliectasis noted within the renal transplant. Urinary bladder is markedly distended to the level of the umbilicus. Aortoiliac atherosclerosis.        UA 12/12 - >50 wbc, 6-10 rbc, many bact, yeast ++, prot100  Assessment/ Plan: 1. AKI renal transplant - b/l creat 1.1- 1.5 from 2021. Hx of recent AKI in Nov 2021 w/ creat 3.0 >> 1.2 at dc.  AKI here due to vol depletion, bladder retention +/- other. CT showed markedly enlarged bladder. Foley cath placed, creat did not improve w/ foley placement. Good UOP. Will dc IVF's given LE edema. Will follow.  2. Hx of renal /panc transplant - cont renal Tx IS meds (CyA, pred, imuran) 3. Infected sacral decub - on IV abx, rec change IV vanc to dapto for renal protection (pharm will do this).  4. DM 2 on insulin 5. FTT - 3 admits in last 60 day. Extremely frail on exam. GOC pall consult ordered.  6. HTN - BP's okay 7. Anemia ckd / chronic illness - Hb ~8    [name] 12/10/2020, 10:56 AM   Recent Labs  Lab  12/07/20 2301 12/08/20 0407 12/09/20 0224 12/10/20 0236  K  --  4.1 4.0 4.1  BUN  --  76* 73* 76*  CREATININE  --  1.78* 1.79* 1.70*  CALCIUM  --  8.7* 8.7* 8.4*  PHOS 3.9 3.3  --   --   HGB  --  8.3* 7.8* 7.0*   Inpatient medications: . azaTHIOprine  50 mg Oral Daily  . Chlorhexidine Gluconate Cloth  6 each Topical Daily  . Chlorhexidine Gluconate Cloth  6 each Topical Q0600  . collagenase   Topical Daily  . cycloSPORINE  75 mg Oral BID  . feeding supplement  237 mL Oral BID BM  . insulin aspart  0-9 Units Subcutaneous Q4H  . insulin glargine  8 Units Subcutaneous QHS  . levothyroxine  50 mcg Oral Q0600  . mouth rinse  15 mL Mouth Rinse BID  . metroNIDAZOLE  500 mg Oral Q8H  . multivitamin with minerals  1 tablet Oral Daily  . mupirocin ointment  1 application Nasal BID  . nutrition supplement (JUVEN)  1 packet Oral BID BM  . nystatin   Topical TID  . pantoprazole  40 mg Oral BID  . [START ON 12/11/2020] predniSONE  5 mg Oral Q breakfast  . rosuvastatin  5 mg Oral QHS  . sertraline  25 mg Oral Daily  . sodium bicarbonate  650 mg Oral BID   . sodium chloride    . ceFEPime (MAXIPIME) IV Stopped (12/09/20 2026)  . DAPTOmycin (CUBICIN)  IV     acetaminophen **OR** acetaminophen, HYDROcodone-acetaminophen

## 2020-12-10 NOTE — Consult Note (Signed)
Consultation Note Date: 12/10/2020   Patient Name: [name]  DOB: Feb 22, 1961  MRN: [id number]  Age / Sex: 59 y.o., female  PCP: [name], MD Referring Physician: Flora Lipps, MD  Reason for Consultation: Establishing goals of care  HPI/Patient Profile: 59 y.o. 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     Clinical Assessment and Goals of Care: I met today with [name].  We discussed clinical course this admission as well as the changes that she has been experiencing over the past few months.  She reports that prior to October, she had been living at home with the assistance of her husband.  We discussed wishes moving forward in regard to advanced directives.  She feels that she continues to benefit from hospitalization and aggressive care.  She does not feel that she has been getting the level of care she needs from SNF and tells me that she really wants to explore options for home health rather than going back to [facility].  We discussed her recurrent hospitalizations, and she feels that she has had recurrent admissions due to being discharged from the hospital too soon.  States she "has never really had enough time to get better from when I was originally sick."    I offered to call her husband, but she reports that heis an Conservation officer, nature and in class all day today.  Questions and concerns addressed.   PMT will continue to support holistically.   SUMMARY OF RECOMMENDATIONS   - Full code/Full scope - She reports that she want to continue aggressive care and is hopeful to get back to her prior baseline health in October of this year. - She has been  unhappy at SNF and wants to explore options for home health rehab.  I did not get to discuss this with her husband and it is unclear to me how much assistance would be available in her home.  Code Status/Advance Care Planning:  Full code  Palliative Prophylaxis:   Aspiration and Frequent Pain Assessment  Additional Recommendations (Limitations, Scope, Preferences):  Full Scope Treatment  Psycho-social/Spiritual:   Desire for further Chaplaincy support:Did not address today  Additional Recommendations: Caregiving  Support/Resources  Prognosis:   Unable to determine  Discharge Planning: To Be Determined      Primary Diagnoses: Present on Admission: . Pressure injury of skin . Hypertension . Chronic kidney disease (CKD) stage G3a/A1, moderately decreased glomerular filtration rate (GFR) between 45-59 mL/min/1.73 square meter and albuminuria creatinine ratio less than 30 mg/g (HCC) . Anemia secondary to renal failure . Sepsis ([place]) . Hypoalbuminemia . Hyponatremia . CAP (community acquired pneumonia) . Acute urinary retention . Acute kidney injury superimposed on CKD ([place]) . Acute metabolic encephalopathy . Sacral decubitus ulcer, stage III ([place]) . Cellulitis . Hypotension . Malodorous urine . Erosive esophagitis . Hypothyroid .  Depression   I have reviewed the medical record, interviewed the patient and family, and examined the patient. The following aspects are pertinent.  Past Medical History:  Diagnosis Date  . Anemia of chronic renal failure   . Diabetes mellitus without complication [facility])    Social History   Socioeconomic History  . Marital status: Married    Spouse name: Not on file  . Number of children: Not on file  . Years of education: Not on file  . Highest education level: Not on file  Occupational History  . Not on file  Tobacco Use  . Smoking status: Never Smoker  . Smokeless tobacco: Never Used  Vaping Use  . Vaping Use: Never used   Substance and Sexual Activity  . Alcohol use: Never  . Drug use: Never  . Sexual activity: Not Currently    Birth control/protection: None  Other Topics Concern  . Not on file  Social History Narrative  . Not on file   Social Determinants of Health   Financial Resource Strain: Not on file  Food Insecurity: Not on file  Transportation Needs: Not on file  Physical Activity: Not on file  Stress: Not on file  Social Connections: Not on file   Family History  Problem Relation Age of Onset  . Hypertension Mother   . Hypertension Father    Scheduled Meds: . azaTHIOprine  50 mg Oral Daily  . Chlorhexidine Gluconate Cloth  6 each Topical Daily  . Chlorhexidine Gluconate Cloth  6 each Topical Q0600  . collagenase   Topical Daily  . cycloSPORINE  75 mg Oral BID  . feeding supplement  237 mL Oral BID BM  . insulin aspart  0-9 Units Subcutaneous Q4H  . insulin glargine  8 Units Subcutaneous QHS  . levothyroxine  50 mcg Oral Q0600  . mouth rinse  15 mL Mouth Rinse BID  . metroNIDAZOLE  500 mg Oral Q8H  . multivitamin with minerals  1 tablet Oral Daily  . mupirocin ointment  1 application Nasal BID  . nutrition supplement (JUVEN)  1 packet Oral BID BM  . nystatin   Topical TID  . pantoprazole  40 mg Oral BID  . [START ON 12/11/2020] predniSONE  5 mg Oral Q breakfast  . rosuvastatin  5 mg Oral QHS  . sertraline  25 mg Oral Daily  . sodium bicarbonate  650 mg Oral BID   Continuous Infusions: . ceFEPime (MAXIPIME) IV Stopped (12/09/20 2026)  . DAPTOmycin (CUBICIN)  IV     PRN Meds:.acetaminophen **OR** acetaminophen, HYDROcodone-acetaminophen Medications Prior to Admission:  Prior to Admission medications   Medication Sig Start Date End Date Taking? Authorizing Provider  acetaminophen (TYLENOL) 325 MG tablet Take 2 tablets (650 mg total) by mouth every 6 (six) hours as needed for mild pain (or Fever >/= 101). Patient taking differently: Take 650 mg by mouth every 6 (six) hours  as needed for mild pain or fever. 10/06/20  Yes Nita Sells, MD  azaTHIOprine (IMURAN) 50 MG tablet Take 50 mg by mouth daily. 08/23/20  Yes [provider]  collagenase (SANTYL) ointment Apply topically daily. 11/08/20  Yes Swayze, Ava, DO  cycloSPORINE (SANDIMMUNE) 25 MG capsule Take 75 mg by mouth 2 (two) times daily.   Yes [provider]  insulin glargine (LANTUS) 100 UNIT/ML injection Inject 0.08 mLs (8 Units total) into the skin 2 (two) times daily. 11/07/20  Yes Swayze, Ava, DO  insulin lispro (HUMALOG) 100 UNIT/ML injection Inject 2  Units into the skin 3 (three) times daily before meals.   Yes [provider]  metoprolol tartrate (LOPRESSOR) 25 MG tablet Take 12.5 mg by mouth 2 (two) times daily.  09/15/20  Yes [provider]  pantoprazole (PROTONIX) 40 MG tablet Take 1 tablet (40 mg total) by mouth 2 (two) times daily. 11/12/20  Yes Barb Merino, MD  rosuvastatin (CRESTOR) 5 MG tablet Take 1 tablet (5 mg total) by mouth at bedtime. 11/07/20  Yes Swayze, Ava, DO  sertraline (ZOLOFT) 25 MG tablet Take 1 tablet (25 mg total) by mouth daily. 10/06/20  Yes Nita Sells, MD  sodium bicarbonate 650 MG tablet Take 1 tablet (650 mg total) by mouth 2 (two) times daily. 10/06/20  Yes Nita Sells, MD  torsemide (DEMADEX) 20 MG tablet Take 1 tablet (20 mg total) by mouth daily. 11/13/20  Yes Barb Merino, MD  traMADol (ULTRAM) 50 MG tablet Take 50 mg by mouth every 6 (six) hours as needed. 11/19/20  Yes [provider]   No Known Allergies Review of Systems  Constitutional: Positive for activity change and fatigue.  Neurological: Positive for weakness.  Psychiatric/Behavioral: Positive for sleep disturbance.   Physical Exam  General: Alert, awake, in no acute distress.  HEENT: No bruits, no goiter, no JVD Heart: Regular rate and rhythm. No murmur appreciated. Lungs: Good air movement, clear Abdomen: Soft, nontender,  nondistended, positive bowel sounds.  Ext: No significant edema, R BKA Neuro: Grossly intact, nonfocal.   Vital Signs: BP (!) 157/80   Pulse 82   Temp 98.1 F (36.7 C) (Oral)   Resp 10   Ht '5\' 3"'  (1.6 m)   Wt 52.2 kg   SpO2 98%   BMI 20.37 kg/m  Pain Scale: 0-10   Pain Score: 3    SpO2: SpO2: 98 % O2 Device:SpO2: 98 % O2 Flow Rate: .   IO: Intake/output summary:   Intake/Output Summary (Last 24 hours) at 12/10/2020 1109 Last data filed at 12/10/2020 0500 Gross per 24 hour  Intake 552.04 ml  Output 950 ml  Net -397.96 ml    LBM: Last BM Date: 12/08/20 Baseline Weight: Weight: 52.2 kg Most recent weight: Weight: 52.2 kg     Palliative Assessment/Data:     Time In: 1330 Time Out: 1420 Time Total: 50 Greater than 50%  of this time was spent counseling and coordinating care related to the above assessment and plan.  Signed by: Micheline Rough, MD   Please contact Palliative Medicine Team phone at [phone] for questions and concerns.  For individual provider: See Shea Evans

## 2020-12-10 NOTE — Progress Notes (Signed)
Report called to Dana RN

## 2020-12-10 NOTE — Progress Notes (Addendum)
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 failure   History of simultaneous kidney and pancreas transplant ([place])   Chronic kidney disease (CKD) stage G3a/A1, moderately decreased glomerular filtration rate (GFR) between 45-59 mL/min/1.73 square meter and albuminuria creatinine ratio less than 30 mg/g (HCC)   Hypertension   Acute kidney injury superimposed on CKD (HCC)   Type 1 diabetes mellitus with complication, with long term current use of insulin pump (HCC)   Pressure injury of skin   Long-term use of immunosuppressant medication   Sepsis ([place])   Hypoalbuminemia   Hyponatremia   CAP (community acquired pneumonia)   Acute urinary retention   Acute metabolic encephalopathy   Sacral decubitus ulcer, stage III (HCC)   Cellulitis   Hypotension   Malodorous urine   Erosive esophagitis   Hypothyroid   Depression   Type 1 diabetes mellitus with hyperlipidemia (HCC)   Malnutrition of moderate degree    Acute metabolic encephalopathy like related to infected sacral ulcer, resolved.    Continue delirium precautions.  Hypotension on presentation  Likely secondary to volume depletion infection and adrenal insufficiency being on chronic prednisone.  Responded well with IV fluids.  Metoprolol and  torsemide on hold.  On maintenance fluids.  Improved  Cellulitis of unstageable sacral decubitus ulcer, present on admission.   CT of pelvis shows no evidence of osteomyelitis.  Patient did have features of infection on presentation.  On empiric vancomycin, cefepime and Flagyl.  Continue hydrotherapy.  Follow blood cultures- negative in 1 day.  Acute urinary retention with bacteriuria.  On Foley catheter.  Already on antibiotics.  Urine culture with 90,000 colonies of yeast, likely colonization.  Possible pneumonia cited on CXR.   chest x-ray is consistent with atelectasis versus pneumonia.  Patient is already on broad-spectrum antibiotic.  No pulmonary symptoms.  AKI on CKD stage III s/p renal transplant (1989), slowly improving.  Baseline creatinine 1.2-1.3.    Creatinine today at 1.7.  Has plateaued.  Likely secondary to volume depletion infection urinary retention.  On maintenance fluid at 50 mils per hour.  Nephrology on board, continue Imuran, prednisone and cyclosporine, sodium bicarb  Macrocytic anemia, hemoglobin stable and is at baseline.  B12 and folate within normal limits.  No bleeding   Type 1 diabetes status post post pancreas transplant (1989), poorly controlled with episodes of hypoglycemia as outpatient .  Last A1c 09/2020 7.2.  FBG in low 100s here.  Used to be on the insulin pump in the past.  Currently on Lantus.  Continue sliding scale insulin.  Follows up with endocrine as outpatient.    Status post BKA (10/01/2020) for subacute osteomyelitis.    Mild erythema.  Erosive esophagitis, stable.   Status post EGD on 11/10/2020.  Continue PPI twice daily  Hypothyroidism, -Continue Synthroid  Hyperlipidemia On Crestor.  Depression, stable  Continue Zoloft  Pressure Injury  09/27/20 Sacrum Medial Unstageable - Full thickness tissue loss in which the base of the injury is covered by slough (yellow, tan, gray, green or brown) and/or eschar (tan, brown or black) in the  wound bed. (Active)  09/27/20 1534  Location: Sacrum  Location Orientation: Medial  Staging: Unstageable - Full thickness tissue loss in which the base of the injury is covered by slough (yellow, tan, gray, green or brown) and/or eschar (tan, brown or black) in the wound bed.  Wound Description (Comments):   Present on Admission: Yes   Moderate protein calorie malnutrition.  Present on admission.  Nutrition on board.  Continue nutritional supplements.   DVT prophylaxis: SCDs Start: 12/08/20 0053   Code Status: Full code  Family Communication: Left a message at the patient's husband home phone.  Status is: Inpatient  Remains inpatient appropriate because:IV treatments appropriate due to intensity of illness or inability to take PO and Inpatient level of care appropriate due to severity of illness, IV antibiotics,  Dispo: The patient is from: SNF              Anticipated d/c is to: SNF              Anticipated d/c date is: 2 days              Patient currently is not medically stable to d/c.   Consultants:  Nephrology  Wound care  Procedures:  None  Antibiotics:  . Vancomycin, cefepime and metronidazole 12/12>  Anti-infectives (From admission, onward)   Start     Dose/Rate Route Frequency Ordered Stop   12/08/20 2100  vancomycin (VANCOREADY) IVPB 500 mg/100 mL        500 mg 100 mL/hr over 60 Minutes Intravenous Every 24 hours 12/08/20 0040     12/08/20 2000  ceFEPIme (MAXIPIME) 2 g in sodium chloride 0.9 % 100 mL IVPB        2 g 200 mL/hr over 30 Minutes Intravenous Every 24 hours 12/08/20 0036     12/08/20 1400  metroNIDAZOLE (FLAGYL) tablet 500 mg        500 mg Oral Every 8 hours 12/08/20 1049     12/08/20 0600  metroNIDAZOLE (FLAGYL) IVPB 500 mg  Status:  Discontinued        500 mg 100 mL/hr over 60 Minutes Intravenous Every 8 hours 12/07/20 2248 12/08/20 1049   12/07/20 2030  ceFEPIme (MAXIPIME) 2 g in sodium chloride 0.9 % 100 mL IVPB        2 g 200 mL/hr  over 30 Minutes Intravenous  Once 12/07/20 2018 12/07/20 2105   12/07/20 2030  metroNIDAZOLE (FLAGYL) IVPB 500 mg        500 mg 100 mL/hr over 60 Minutes Intravenous  Once 12/07/20 2018 12/07/20 2321   12/07/20 2030  vancomycin (VANCOCIN) IVPB 1000 mg/200 mL premix        1,000 mg 200 mL/hr over 60 Minutes Intravenous  Once 12/07/20 2018 12/07/20 2212     Subjective: Today, patient was seen and examined at bedside.  Patient feels okay.  Denies any fever, chills or rigor.  Denies any nausea vomiting or diarrhea.  Objective: Vitals:   12/10/20 0500 12/10/20 0600  BP: 126/61 (!) 157/80  Pulse: 76 82  Resp: 14 10  Temp: 97.6 F (36.4 C)   SpO2: 95% 98%    Intake/Output Summary (Last 24 hours) at 12/10/2020 0745 Last data filed at 12/10/2020 0500 Gross per 24 hour  Intake 552.04  ml  Output 950 ml  Net -397.96 ml   Filed Weights   12/07/20 2356  Weight: 52.2 kg   Body mass index is 20.37 kg/m.   Physical Exam: GENERAL: Patient is alert awake and communicative. Not in obvious distress. HENT: No scleral pallor or icterus. Pupils equally reactive to light. Oral mucosa is moist NECK: is supple, no gross swelling noted. CHEST: Clear to auscultation. No crackles or wheezes.  Diminished breath sounds bilaterally. CVS: S1 and S2 heard, no murmur. Regular rate and rhythm.  ABDOMEN: Soft, non-tender, bowel sounds are present. EXTREMITIES: Right below-knee amputation stump with mild erythema CNS: Cranial nerves are intact. No focal motor deficits. SKIN: warm and dry, sacral decubitus ulceration on admission, right below-knee amputation   Data Review: I have personally reviewed the following laboratory data and studies,  CBC: Recent Labs  Lab 12/07/20 2010 12/08/20 0407 12/09/20 0224 12/10/20 0236  WBC 5.6 4.9 5.0 4.9  NEUTROABS 4.2 3.3  --   --   HGB 8.0* 8.3* 7.8* 7.0*  HCT 25.1* 26.4* 24.4* 22.1*  MCV 104.6* 106.0* 105.6* 107.3*  PLT 246 210 204 027   Basic Metabolic  Panel: Recent Labs  Lab 12/07/20 2010 12/07/20 2301 12/08/20 0407 12/09/20 0224 12/10/20 0236  NA 132*  --  131* 135 131*  K 4.2  --  4.1 4.0 4.1  CL 90*  --  91* 96* 95*  CO2 29  --  26 27 25   GLUCOSE 118*  --  106* 131* 271*  BUN 86*  --  76* 73* 76*  CREATININE 1.97*  --  1.78* 1.79* 1.70*  CALCIUM 8.9  --  8.7* 8.7* 8.4*  MG  --  1.9 1.8  --   --   PHOS  --  3.9 3.3  --   --    Liver Function Tests: Recent Labs  Lab 12/07/20 2010 12/08/20 0407 12/10/20 0236  AST 14* 13* 15  ALT 9 9 8   ALKPHOS 82 78 73  BILITOT 0.8 0.8 0.7  PROT 5.4* 5.2* 4.8*  ALBUMIN 2.0* 1.9* 1.8*   No results for input(s): LIPASE, AMYLASE in the last 168 hours. No results for input(s): AMMONIA in the last 168 hours. Cardiac Enzymes: Recent Labs  Lab 12/07/20 2301  CKTOTAL 11*   BNP (last 3 results) No results for input(s): BNP in the last 8760 hours.  ProBNP (last 3 results) No results for input(s): PROBNP in the last 8760 hours.  CBG: Recent Labs  Lab 12/09/20 1226 12/09/20 1633 12/09/20 1939 12/09/20 2345 12/10/20 0345  GLUCAP 252* 259* 253* 292* 229*   Recent Results (from the past 240 hour(s))  Urine culture     Status: Abnormal   Collection Time: 12/07/20  8:06 PM   Specimen: Urine, Catheterized  Result Value Ref Range Status   Specimen Description   Final    URINE, CATHETERIZED Performed at [facility] [address], [place], [place] [address]    Special Requests   Final    NONE Performed at [facility], [place] [address], [place], [place] [address]    Culture 90,000 COLONIES/mL YEAST (A)  Final   Report Status 12/08/2020 FINAL  Final  Blood culture (routine single)     Status: None (Preliminary result)   Collection Time: 12/07/20  8:10 PM   Specimen: BLOOD  Result Value Ref Range Status   Specimen Description   Final    BLOOD BLOOD LEFT FOREARM Performed at [facility], [place] [address],  [place], [place] [address]    Special Requests   Final    BOTTLES DRAWN AEROBIC AND ANAEROBIC Blood Culture results may not be optimal due to an excessive volume of blood received in culture bottles Performed at [facility] [address] [place], [place] [address]    Culture   Final    NO GROWTH 1 DAY Performed at [facility], [place] [address], [place], [place] [address]    Report Status PENDING  Incomplete  Resp Panel by RT-PCR (Flu A&B, Covid) Nasopharyngeal Swab     Status: None   Collection Time: 12/07/20  8:57 PM   Specimen: Nasopharyngeal Swab; Nasopharyngeal(NP) swabs in vial transport medium  Result Value Ref Range Status   SARS Coronavirus 2 by RT PCR NEGATIVE NEGATIVE Final    Comment: (NOTE) SARS-CoV-2 target nucleic acids are NOT DETECTED.  The SARS-CoV-2 RNA is generally detectable in upper respiratory specimens during the acute phase of infection. The lowest concentration of SARS-CoV-2 viral copies this assay can detect is 138 copies/mL. A negative result does not preclude SARS-Cov-2 infection and should not be used as the sole basis for treatment or other patient management decisions. A negative result may occur with  improper specimen collection/handling, submission of specimen other than nasopharyngeal swab, presence of viral mutation(s) within the areas targeted by this assay, and inadequate number of viral copies(<138 copies/mL). A negative result must be combined with clinical observations, patient history, and epidemiological information. The expected result is Negative.  Fact Sheet for Patients:  EntrepreneurPulse.com.au  Fact Sheet for Healthcare Providers:  IncredibleEmployment.be  This test is no t yet approved or cleared by the [place] FDA and  has been authorized for detection and/or diagnosis of SARS-CoV-2 by FDA under an Emergency Use Authorization (EUA). This EUA will remain  in  effect (meaning this test can be used) for the duration of the COVID-19 declaration under Section 564(b)(1) of the Act, 21 U.S.C.section 360bbb-3(b)(1), unless the authorization is terminated  or revoked sooner.       Influenza A by PCR NEGATIVE NEGATIVE Final   Influenza B by PCR NEGATIVE NEGATIVE Final    Comment: (NOTE) The Xpert Xpress SARS-CoV-2/FLU/RSV plus assay is intended as an aid in the diagnosis of influenza from Nasopharyngeal swab specimens and should not be used as a sole basis for treatment. Nasal washings and aspirates are unacceptable for Xpert Xpress SARS-CoV-2/FLU/RSV testing.  Fact Sheet for Patients: EntrepreneurPulse.com.au  Fact Sheet for Healthcare Providers: IncredibleEmployment.be  This test is not yet approved or cleared by the [place] FDA and has been authorized for detection and/or diagnosis of SARS-CoV-2 by FDA under an Emergency Use Authorization (EUA). This EUA will remain in effect (meaning this test can be used) for the duration of the COVID-19 declaration under Section 564(b)(1) of the Act, 21 U.S.C. section 360bbb-3(b)(1), unless the authorization is terminated or revoked.  Performed at [facility], [place] [address], [place], [place] [address]   MRSA PCR Screening     Status: Abnormal   Collection Time: 12/08/20  1:53 PM   Specimen: Nasopharyngeal  Result Value Ref Range Status   MRSA by PCR POSITIVE (A) NEGATIVE Final    Comment:        The GeneXpert MRSA Assay (FDA approved for NASAL specimens only), is one component of a comprehensive MRSA colonization surveillance program. It is not intended to diagnose MRSA infection nor to guide or monitor treatment  for MRSA infections. RESULT CALLED TO, READ BACK BY AND VERIFIED WITH: ROBINSON,A. RN @1614  12/08/20 BILLINGSLEY,L Performed at [facility], [place] [address] [place], [place] [address]       Studies: ECHOCARDIOGRAM COMPLETE  Result Date: 12/08/2020    ECHOCARDIOGRAM REPORT   Patient Name:   MAYLINE DRAGON Date of Exam: 12/08/2020 Medical Rec #:  [id number]       Height:       63.0 in Accession #:    [id number]      Weight:       115.0 lb Date of Birth:  [birth year]      BSA:          1.528 m Patient Age:    79 years        BP:           130/71 mmHg Patient Gender: F               HR:           85 bpm. Exam Location:  Inpatient Procedure: 2D Echo, Color Doppler and Cardiac Doppler Indications:    R94.31 Abnormal EKG  History:        Patient has no prior history of Echocardiogram examinations.                 Risk Factors:Diabetes.  Sonographer:    Bernadene Person RDCS Referring Phys: [place]  1. Left ventricular ejection fraction, by estimation, is 65 to 70%. The left ventricle has normal function. The left ventricle has no regional wall motion abnormalities. There is moderate left ventricular hypertrophy. Left ventricular diastolic parameters are consistent with Grade I diastolic dysfunction (impaired relaxation).  2. Right ventricular systolic function is normal. The right ventricular size is normal. Mildly increased right ventricular wall thickness. Tricuspid regurgitation signal is inadequate for assessing PA pressure.  3. The mitral valve is normal in structure. Trivial mitral valve regurgitation. No evidence of mitral stenosis.  4. The aortic valve was not well visualized. Aortic valve regurgitation is trivial. No aortic stenosis is present.  5. The inferior vena cava is normal in size with greater than 50% respiratory variability, suggesting right atrial pressure of 3 mmHg.  6. A small pericardial effusion is present. FINDINGS  Left Ventricle: Left ventricular ejection fraction, by estimation, is 65 to 70%. The left ventricle has normal function. The left ventricle has no regional wall motion abnormalities. The left ventricular internal cavity size was normal in  size. There is  moderate left ventricular hypertrophy. Left ventricular diastolic parameters are consistent with Grade I diastolic dysfunction (impaired relaxation). Right Ventricle: The right ventricular size is normal. Mildly increased right ventricular wall thickness. Right ventricular systolic function is normal. Tricuspid regurgitation signal is inadequate for assessing PA pressure. Left Atrium: Left atrial size was normal in size. Right Atrium: Right atrial size was normal in size. Pericardium: A small pericardial effusion is present. Mitral Valve: The mitral valve is normal in structure. Trivial mitral valve regurgitation. No evidence of mitral valve stenosis. Tricuspid Valve: The tricuspid valve is normal in structure. Tricuspid valve regurgitation is not demonstrated. Aortic Valve: The aortic valve was not well visualized. Aortic valve regurgitation is trivial. Aortic regurgitation PHT measures 520 msec. No aortic stenosis is present. Pulmonic Valve: The pulmonic valve was normal in structure. Pulmonic valve regurgitation is not visualized. Aorta: The aortic root is normal in size and structure. Venous: The inferior vena cava is normal in size with greater than  50% respiratory variability, suggesting right atrial pressure of 3 mmHg. IAS/Shunts: No atrial level shunt detected by color flow Doppler.  LEFT VENTRICLE PLAX 2D LVIDd:         2.50 cm  Diastology LVIDs:         1.20 cm  LV e' medial:    3.59 cm/s LV PW:         1.70 cm  LV E/e' medial:  15.8 LV IVS:        1.70 cm  LV e' lateral:   3.81 cm/s LVOT diam:     1.80 cm  LV E/e' lateral: 14.9 LV SV:         48 LV SV Index:   31 LVOT Area:     2.54 cm  RIGHT VENTRICLE RV S prime:     18.30 cm/s TAPSE (M-mode): 1.4 cm LEFT ATRIUM             Index      RIGHT ATRIUM          Index LA diam:        3.30 cm 2.16 cm/m RA Area:     3.33 cm LA Vol (A2C):   11.4 ml 7.46 ml/m RA Volume:   3.61 ml  2.36 ml/m LA Vol (A4C):   8.6 ml  5.62 ml/m LA Biplane Vol:  10.1 ml 6.61 ml/m  AORTIC VALVE LVOT Vmax:   96.70 cm/s LVOT Vmean:  70.700 cm/s LVOT VTI:    0.188 m AI PHT:      520 msec  AORTA Ao Root diam: 3.30 cm Ao Asc diam:  2.60 cm MITRAL VALVE MV Area (PHT): 2.56 cm    SHUNTS MV Decel Time: 296 msec    Systemic VTI:  0.19 m MV E velocity: 56.60 cm/s  Systemic Diam: 1.80 cm MV A velocity: 72.00 cm/s MV E/A ratio:  0.79 Loralie Champagne MD Electronically signed by Loralie Champagne MD Signature Date/Time: 12/08/2020/5:15:53 PM    Final       Flora Lipps, MD  [facility] 12/10/2020  If 7PM-7AM, please contact night-coverage

## 2020-12-10 NOTE — Progress Notes (Signed)
Inpatient Diabetes Program Recommendations  AACE/ADA: New Consensus Statement on Inpatient Glycemic Control (2015)  Target Ranges:  Prepandial:   less than 140 mg/dL      Peak postprandial:   less than 180 mg/dL (1-2 hours)      Critically ill patients:  140 - 180 mg/dL   Lab Results  Component Value Date   GLUCAP 196 (H) 12/10/2020   HGBA1C 8.6 (H) 12/08/2020    Review of Glycemic Control Results for [name], [name] (MRN [id number]) as of 12/10/2020 09:30  Ref. Range 12/09/2020 23:45 12/10/2020 03:45 12/10/2020 08:08  Glucose-Capillary Latest Ref Range: 70 - 99 mg/dL 292 (H) 229 (H) 196 (H)    Presented from her SNF on 12/12 with reports of altered mental status in setting of increased drainage and odor from her sacral ulcer concerning for infection  History: Type 1 Diabetes (s/p pancreas transplant 1989), CKD  SNF DM Meds: Lantus 8 units BID                          Humalog 2 units TID with meals  Current Orders: Lantus 8 units QHS                            Novolog Sensitive Correction Scale/ SSI (0-9 units) Q4 hours  Solucortef discontinued this AM, Prednisone 5 mg QAM to start in AM 12/16  Consider increasing Lantus to 6 units BID (home dose 8 units BID).   Thanks, Bronson Curb, MSN, RNC-OB Diabetes Coordinator [phone] (8a-5p)

## 2020-12-10 NOTE — Plan of Care (Signed)
  Problem: Education: Goal: Knowledge of General Education information will improve Description: Including pain rating scale, medication(s)/side effects and non-pharmacologic comfort measures Outcome: Progressing   Problem: Activity: Goal: Risk for activity intolerance will decrease Outcome: Progressing   

## 2020-12-10 NOTE — Progress Notes (Signed)
   Hydrotherapy treatment note 12/10/20  1429-1506    Subjective I like  for you help me.  Patient and Family Stated Goals agreed to wound care  Date of Onset  (present on admission)  Prior Treatments PLS 1 month ago,dressing changes  Evaluation and Treatment  Evaluation and Treatment Procedures Explained to Patient/Family Yes  Evaluation and Treatment Procedures agreed to  Wound / Incision (Open or Dehisced) 12/09/20 Other (Comment) Sacrum Medial open nonstageable sacral wound, malodorous with slough.  Date First Assessed: 12/09/20   Wound Type: Other (Comment)  Location: Sacrum  Location Orientation: Medial  Wound Description (Comments): open nonstageable sacral wound, malodorous with slough.  Dressing Type Foam - Lift dressing to assess site every shift  Dressing Changed New  Dressing Status Clean;Dry;Intact  Dressing Change Frequency Daily  % Wound base Red or Granulating 25%  % Wound base Yellow/Fibrinous Exudate 75%, dark slough  Peri-wound Assessment Erythema (blanchable)  Wound Length (cm) 3.5 cm  Wound Width (cm) 3 cm  Wound Depth (cm) 1.5 cm  Wound Volume (cm^3) 15.75 cm^3  Wound Surface Area (cm^2) 10.5 cm^2  Tunneling (cm) 3.5 at 12:00  Undermining (cm) 2-3 cm from 9-3:00  Margins Unattached edges (unapproximated)  Drainage Amount Moderate  Drainage Description Purulent;Odor  Non-staged Wound Description Full thickness  Treatment Debridement (Selective);Hydrotherapy (Pulse lavage);Packing (Saline gauze) (santly)  Hydrotherapy  Pulsed Lavage with Suction (psi) 8 psi  Pulsed Lavage with Suction - Normal Saline Used 1000 mL  Pulsed Lavage Tip Tip with splash shield  Pulsed lavage therapy - wound location sacrum  Selective Debridement  Selective Debridement - Location sacrum  Selective Debridement - Tools Used Forceps;Scissors  Selective Debridement - Tissue Removed slough and eschar  Wound Therapy - Assess/Plan/Recommendations  Wound Therapy - Clinical Statement  Patient was premedicated, still indicates discomfort, especially with debridement for  proximal wound which  has more gray purulent slough. Slight improvement in brightness of existing granulation tissue. Continue  PLS for wound debridement and cleansing.  Wound Therapy - Functional Problem List bed bound  Factors Delaying/Impairing Wound Healing Incontinence;Multiple medical problems  Hydrotherapy Plan Debridement;Dressing change;Pulsatile lavage with suction  Wound Therapy - Frequency 6X / week  Wound Therapy - Current Recommendations Case manager/social work  Wound Therapy - Follow Up Recommendations Skilled nursing facility  Wound Plan PLS, debridement, dressing change  Wound Therapy Goals - Improve the function of patient's integumentary system by progressing the wound(s) through the phases of wound healing by:  Decrease Necrotic Tissue to 25  Decrease Necrotic Tissue - Progress Progressing toward goal  Increase Granulation Tissue to 75  Increase Granulation Tissue - Progress Progressing toward goal  Patient/Family will be able to  reposition on sides  Patient/Family Instruction Goal - Progress Progressing toward goal  Goals/treatment plan/discharge plan were made with and agreed upon by patient/family Yes  Time For Goal Achievement 2 weeks  Wound Therapy - Potential for Goals Fair  [place] Pager [phone] Office [phone]

## 2020-12-11 LAB — CBC
HCT: 23 % — ABNORMAL LOW (ref 36.0–46.0)
Hemoglobin: 7.2 g/dL — ABNORMAL LOW (ref 12.0–15.0)
MCH: 33.5 pg (ref 26.0–34.0)
MCHC: 31.3 g/dL (ref 30.0–36.0)
MCV: 107 fL — ABNORMAL HIGH (ref 80.0–100.0)
Platelets: 155 10*3/uL (ref 150–400)
RBC: 2.15 MIL/uL — ABNORMAL LOW (ref 3.87–5.11)
RDW: 19 % — ABNORMAL HIGH (ref 11.5–15.5)
WBC: 5 10*3/uL (ref 4.0–10.5)
nRBC: 0.4 % — ABNORMAL HIGH (ref 0.0–0.2)

## 2020-12-11 LAB — GLUCOSE, CAPILLARY
Glucose-Capillary: 221 mg/dL — ABNORMAL HIGH (ref 70–99)
Glucose-Capillary: 267 mg/dL — ABNORMAL HIGH (ref 70–99)
Glucose-Capillary: 277 mg/dL — ABNORMAL HIGH (ref 70–99)
Glucose-Capillary: 278 mg/dL — ABNORMAL HIGH (ref 70–99)
Glucose-Capillary: 280 mg/dL — ABNORMAL HIGH (ref 70–99)
Glucose-Capillary: 291 mg/dL — ABNORMAL HIGH (ref 70–99)

## 2020-12-11 LAB — COMPREHENSIVE METABOLIC PANEL
ALT: 8 U/L (ref 0–44)
AST: 13 U/L — ABNORMAL LOW (ref 15–41)
Albumin: 1.6 g/dL — ABNORMAL LOW (ref 3.5–5.0)
Alkaline Phosphatase: 69 U/L (ref 38–126)
Anion gap: 11 (ref 5–15)
BUN: 71 mg/dL — ABNORMAL HIGH (ref 6–20)
CO2: 27 mmol/L (ref 22–32)
Calcium: 8.5 mg/dL — ABNORMAL LOW (ref 8.9–10.3)
Chloride: 96 mmol/L — ABNORMAL LOW (ref 98–111)
Creatinine, Ser: 1.4 mg/dL — ABNORMAL HIGH (ref 0.44–1.00)
GFR, Estimated: 43 mL/min — ABNORMAL LOW (ref >60)
Glucose, Bld: 335 mg/dL — ABNORMAL HIGH (ref 70–99)
Potassium: 3.7 mmol/L (ref 3.5–5.1)
Sodium: 134 mmol/L — ABNORMAL LOW (ref 135–145)
Total Bilirubin: 0.7 mg/dL (ref 0.3–1.2)
Total Protein: 4.7 g/dL — ABNORMAL LOW (ref 6.5–8.1)

## 2020-12-11 LAB — MAGNESIUM: Magnesium: 1.8 mg/dL (ref 1.7–2.4)

## 2020-12-11 LAB — PHOSPHORUS: Phosphorus: 1.9 mg/dL — ABNORMAL LOW (ref 2.5–4.6)

## 2020-12-11 MED ORDER — INSULIN GLARGINE 100 UNIT/ML ~~LOC~~ SOLN
8.0000 [IU] | Freq: Two times a day (BID) | SUBCUTANEOUS | Status: DC
  Administered 2020-12-11 – 2020-12-22 (×22): 8 [IU] via SUBCUTANEOUS
  Filled 2020-12-11 (×22): qty 0.08

## 2020-12-11 MED ORDER — POTASSIUM PHOSPHATES 15 MMOLE/5ML IV SOLN
30.0000 mmol | Freq: Once | INTRAVENOUS | Status: AC
  Administered 2020-12-11: 30 mmol via INTRAVENOUS
  Filled 2020-12-11: qty 10

## 2020-12-11 MED ORDER — SODIUM CHLORIDE 0.9 % IV SOLN
2.0000 g | Freq: Two times a day (BID) | INTRAVENOUS | Status: AC
  Administered 2020-12-11 – 2020-12-14 (×7): 2 g via INTRAVENOUS
  Filled 2020-12-11 (×7): qty 2

## 2020-12-11 NOTE — NC FL2 (Signed)
[place] LEVEL OF CARE SCREENING TOOL     IDENTIFICATION  Patient Name: [name] Birthdate: 08-23-1961 Sex: female Admission Date (Current Location): 12/07/2020  [facility] and [place] Number:  Herbalist and Address:  [facility],  [place] [address], [place]      Provider Number: [id number]  Attending Physician Name and Address:  Flora Lipps, MD  Relative Name and Phone Number:  [name] spouse [phone]    Current Level of Care: Hospital Recommended Level of Care: [place] Prior Approval Number:    Date Approved/Denied:   PASRR Number: [id number]  Discharge Plan: SNF    Current Diagnoses: Patient Active Problem List   Diagnosis Date Noted  . Malnutrition of moderate degree 12/10/2020  . Acute metabolic encephalopathy 41/96/2229  . Sacral decubitus ulcer, stage III ([place]) 12/08/2020  . Cellulitis 12/08/2020  . Hypotension 12/08/2020  . Malodorous urine 12/08/2020  . Erosive esophagitis 12/08/2020  . Hypothyroid 12/08/2020  . Depression 12/08/2020  . Type 1 diabetes mellitus with hyperlipidemia ([place]) 12/08/2020  . Sepsis ([place]) 12/07/2020  . Hypoalbuminemia 12/07/2020  . Hyponatremia 12/07/2020  . CAP (community acquired pneumonia) 12/07/2020  . Acute urinary retention 12/07/2020  . Coffee ground emesis   . Gastroesophageal reflux disease with esophagitis and hemorrhage   . Long-term use of immunosuppressant medication   . GI bleed 11/08/2020  . AKI (acute kidney injury) ([place]) 10/29/2020  . Gangrene of right foot ([place])   . Subacute osteomyelitis of right foot ([place])   . Type 1 diabetes mellitus with complication, with long term current use of insulin pump ([place]) 09/27/2020  . Pressure injury of skin 09/27/2020  . Diabetic ketoacidosis ([place]) 09/26/2020  . Acute kidney injury superimposed on CKD ([place]) 09/26/2020  . Cellulitis and abscess of right leg 09/26/2020  . Anemia secondary to renal  failure 05/14/2020  . History of simultaneous kidney and pancreas transplant ([place]) 05/14/2020  . Chronic kidney disease (CKD) stage G3a/A1, moderately decreased glomerular filtration rate (GFR) between 45-59 mL/min/1.73 square meter and albuminuria creatinine ratio less than 30 mg/g ([facility]) 05/14/2020  . Hypertension 05/14/2020    Orientation RESPIRATION BLADDER Height & Weight     Self,Time  Normal Incontinent Weight: 58 kg (pt. stated that this is a new bed she was moved to yesterday) Height:  5\' 3"  (160 cm)  BEHAVIORAL SYMPTOMS/MOOD NEUROLOGICAL BOWEL NUTRITION STATUS      Incontinent Diet  AMBULATORY STATUS COMMUNICATION OF NEEDS Skin   Limited Assist Verbally PU Stage and Appropriate Care,Hydro Therapy,Other (Comment) (Sacral ulcer-hydrotherapy;R BKA)                       Personal Care Assistance Level of Assistance  Bathing,Feeding,Dressing Bathing Assistance: Limited assistance Feeding assistance: Limited assistance Dressing Assistance: Limited assistance     Functional Limitations Info  Sight,Hearing,Speech   Hearing Info: Adequate Speech Info: Adequate    SPECIAL CARE FACTORS FREQUENCY  PT (By licensed PT),OT (By licensed OT)     PT Frequency: 5x week OT Frequency: 5x week            Contractures Contractures Info: Not present    Additional Factors Info  Code Status,Allergies,Insulin Sliding Scale Code Status Info:  (Full code) Allergies Info:  (NKA)   Insulin Sliding Scale Info:  (SSI see MAR)       Current Medications (12/11/2020):  This is the current hospital active medication list Current Facility-Administered Medications  Medication Dose  Route Frequency Provider Last Rate Last Admin  . acetaminophen (TYLENOL) tablet 650 mg  650 mg Oral Q6H PRN Toy Baker, MD       Or  . acetaminophen (TYLENOL) suppository 650 mg  650 mg Rectal Q6H PRN Doutova, Anastassia, MD      . azaTHIOprine (IMURAN) tablet 50 mg  50 mg Oral Daily Roney Jaffe, MD   50 mg at 12/11/20 0859  . ceFEPIme (MAXIPIME) 2 g in sodium chloride 0.9 % 100 mL IVPB  2 g Intravenous Q24H Graylin Shiver L, RPH 200 mL/hr at 12/10/20 2001 2 g at 12/10/20 2001  . Chlorhexidine Gluconate Cloth 2 % PADS 6 each  6 each Topical Daily Oretha Milch D, MD   6 each at 12/11/20 0900  . Chlorhexidine Gluconate Cloth 2 % PADS 6 each  6 each Topical Q0600 Desiree Hane, MD   6 each at 12/11/20 0228  . collagenase (SANTYL) ointment   Topical Daily Desiree Hane, MD   Given at 12/11/20 [phone]  . cycloSPORINE (SANDIMMUNE) capsule 75 mg  75 mg Oral BID Roney Jaffe, MD   75 mg at 12/11/20 0858  . DAPTOmycin (CUBICIN) 300 mg in sodium chloride 0.9 % IVPB  300 mg Intravenous Q2000 Roney Jaffe, MD 212 mL/hr at 12/10/20 2040 300 mg at 12/10/20 2040  . feeding supplement (ENSURE ENLIVE / ENSURE PLUS) liquid 237 mL  237 mL Oral BID BM Oretha Milch D, MD   237 mL at 12/11/20 0900  . HYDROcodone-acetaminophen (NORCO/VICODIN) 5-325 MG per tablet 1-2 tablet  1-2 tablet Oral Q4H PRN Toy Baker, MD   2 tablet at 12/11/20 0227  . insulin aspart (novoLOG) injection 0-9 Units  0-9 Units Subcutaneous Q4H Toy Baker, MD   5 Units at 12/11/20 0859  . insulin glargine (LANTUS) injection 8 Units  8 Units Subcutaneous QHS Toy Baker, MD   8 Units at 12/10/20 2234  . levothyroxine (SYNTHROID) tablet 50 mcg  50 mcg Oral Q0600 Minda Ditto, RPH   50 mcg at 12/11/20 0646  . MEDLINE mouth rinse  15 mL Mouth Rinse BID Oretha Milch D, MD   15 mL at 12/10/20 2235  . metroNIDAZOLE (FLAGYL) tablet 500 mg  500 mg Oral Q8H Leodis Sias T, RPH   500 mg at 12/11/20 0646  . multivitamin with minerals tablet 1 tablet  1 tablet Oral Daily Oretha Milch D, MD   1 tablet at 12/11/20 0900  . mupirocin ointment (BACTROBAN) 2 % 1 application  1 application Nasal BID Oretha Milch D, MD   1 application at 99/37/16 0900  . nutrition supplement (JUVEN) (JUVEN) powder packet 1  packet  1 packet Oral BID BM Desiree Hane, MD   1 packet at 12/11/20 [phone]  . nystatin (MYCOSTATIN/NYSTOP) topical powder   Topical TID Toy Baker, MD   Given at 12/11/20 0858  . pantoprazole (PROTONIX) EC tablet 40 mg  40 mg Oral BID Minda Ditto, RPH   40 mg at 12/11/20 0900  . predniSONE (DELTASONE) tablet 5 mg  5 mg Oral Q breakfast Roney Jaffe, MD   5 mg at 12/11/20 0900  . rosuvastatin (CRESTOR) tablet 5 mg  5 mg Oral QHS Toy Baker, MD   5 mg at 12/10/20 2235  . sertraline (ZOLOFT) tablet 25 mg  25 mg Oral Daily Doutova, Anastassia, MD   25 mg at 12/11/20 0900  . sodium bicarbonate tablet 650 mg  650 mg Oral BID Toy Baker, MD  650 mg at 12/11/20 2446     Discharge Medications: Please see discharge summary for a list of discharge medications.  Relevant Imaging Results:  Relevant Lab Results:   Additional Information XF#[id number]  Albie Bazin, Juliann Pulse, RN

## 2020-12-11 NOTE — Progress Notes (Signed)
Pharmacy Antibiotic Note  [name] is a 59 y.o. female admitted on 12/07/2020 with sepsis, sacral decubitus ulcer w/o evidence of osteomyelitis.   Pharmacy has been consulted for Cefepime, daptomycin dosing.  SCr decreased to 1.4  Plan: Increase to Cefepime 2gm q12h Continue Daptomycin 300 mg IV q24h Continue metronidazole 500mg  PO q8h Weekly CK - every Wednesday  SCr daily  Height: 5\' 3"  (160 cm) Weight: 58 kg (127 lb 13.9 oz) (pt. stated that this is a new bed she was moved to yesterday) IBW/kg (Calculated) : 52.4  Temp (24hrs), Avg:97.7 F (36.5 C), Min:97.4 F (36.3 C), Max:98 F (36.7 C)  Recent Labs  Lab 12/07/20 2010 12/07/20 2015 12/08/20 0407 12/09/20 0224 12/10/20 0236 12/11/20 0528  WBC 5.6  --  4.9 5.0 4.9 5.0  CREATININE 1.97*  --  1.78* 1.79* 1.70* 1.40*  LATICACIDVEN  --  1.8  --   --   --   --     Estimated Creatinine Clearance: 35.3 mL/min (A) (by C-G formula based on SCr of 1.4 mg/dL (H)).    No Known Allergies  Antimicrobials this admission: 12/12 Cefepime >>  12/12 Vancomycin >> 12/15 12/15 Daptomycin >>  12/12 Flagyl >>  Dose adjustments this admission:   Microbiology results: 12/12 Resp panel: COVID neg; Influenza A/B neg 12/12 UCx: 90k Yeast 12/12 BCx: NGTD 12/13 MRSA PCR: positive   Thank you for allowing pharmacy to be a part of this patient's care.  Gretta Arab PharmD, BCPS Clinical Pharmacist [facility] main pharmacy [phone] 12/11/2020 1:47 PM

## 2020-12-11 NOTE — Progress Notes (Addendum)
   12/11/20 Hydrotherapy  note 1311  Subjective Assessment  Subjective I am sorry that I yell..  Patient and Family Stated Goals agreed to wound care  Date of Onset  (present on this admission)  Prior Treatments PLS 1 month ago,dressing changes  Evaluation and Treatment  Evaluation and Treatment Procedures Explained to Patient/Family Yes  Evaluation and Treatment Procedures agreed to  Wound / Incision (Open or Dehisced) 12/09/20 Other (Comment) Sacrum Medial open nonstageable sacral wound, malodorous with slough.  Date First Assessed: 12/09/20   Wound Type: Other (Comment)  Location: Sacrum  Location Orientation: Medial  Wound Description (Comments): open nonstageable sacral wound, malodorous with slough.  Dressing Type Moist to moist;Foam - Lift dressing to assess site every shift  Dressing Changed New  Dressing Status Clean;Dry  Dressing Change Frequency Daily  Site / Wound Assessment Bleeding;Brown;Friable;Granulation tissue;Painful;Pink  % Wound base Red or Granulating 30%  % Wound base Yellow/Fibrinous Exudate 75%  Peri-wound Assessment Erythema (blanchable);Induration  Wound Length (cm) 3.5 cm  Wound Width (cm) 3 cm  Wound Depth (cm) 1.5 cm  Wound Volume (cm^3) 15.75 cm^3  Wound Surface Area (cm^2) 10.5 cm^2  Undermining (cm) 2-3  cm from 10-5:00  Margins Unattached edges (unapproximated)  Drainage Amount Moderate  Drainage Description Purulent;Serosanguineous;Odor  Non-staged Wound Description Full thickness  Treatment Debridement (Selective);Hydrotherapy (Pulse lavage);Packing (Saline gauze)  Hydrotherapy  Pulsed Lavage with Suction (psi) 8 psi  Pulsed Lavage with Suction - Normal Saline Used 1000 mL  Pulsed Lavage Tip Tip with splash shield  Pulsed lavage therapy - wound location sacrum  Selective Debridement  Selective Debridement - Location sacrum  Selective Debridement - Tools Used Forceps;Scissors  Selective Debridement - Tissue Removed slough and eschar  Wound  Therapy - Assess/Plan/Recommendations  Wound Therapy - Clinical Statement Patient tolerated procedure well. Wound Continues to have dark stringy slough at proximal end with undermining.Yellow fibrinous slough at 3:00. Patient will benefit from hydrotherapy to improve wound qualities.  Wound Therapy - Functional Problem List bed bound  Factors Delaying/Impairing Wound Healing Incontinence;Multiple medical problems  Hydrotherapy Plan Debridement;Dressing change;Pulsatile lavage with suction  Wound Therapy - Frequency 6X / week  Wound Therapy - Current Recommendations Case manager/social work  Wound Therapy - Follow Up Recommendations Skilled nursing facility (LTACH)  Wound Plan PLS, debridement, dressing change  Wound Therapy Goals - Improve the function of patient's integumentary system by progressing the wound(s) through the phases of wound healing by:  Decrease Necrotic Tissue to 25  Decrease Necrotic Tissue - Progress Progressing toward goal  Increase Granulation Tissue to 75  Increase Granulation Tissue - Progress Progressing toward goal  Patient/Family will be able to  reposition on sides  Patient/Family Instruction Goal - Progress Progressing toward goal  Goals/treatment plan/discharge plan were made with and agreed upon by patient/family Yes  Time For Goal Achievement 2 weeks  Wound Therapy - Potential for Goals [place] Pager [phone] Office [phone]   [phone] Deb < 20 cm, 1 dress., gun and shield

## 2020-12-11 NOTE — Progress Notes (Signed)
Inpatient Diabetes Program Recommendations  AACE/ADA: New Consensus Statement on Inpatient Glycemic Control (2015)  Target Ranges:  Prepandial:   less than 140 mg/dL      Peak postprandial:   less than 180 mg/dL (1-2 hours)      Critically ill patients:  140 - 180 mg/dL   Lab Results  Component Value Date   GLUCAP 277 (H) 12/11/2020   HGBA1C 8.6 (H) 12/08/2020    Review of Glycemic Control  Presented from her SNF on 12/12 with reports of altered mental status in setting of increased drainage and odor from her sacral ulcer concerning for infection  History:Type 1 Diabetes (s/p pancreas transplant 1989), CKD  SNFDM Meds:Lantus 8 units BID Humalog 2 units TID with meals  Current Orders:Lantus 8 units QHS Novolog Sensitive Correction Scale/ SSI (0-9 units)Q4 hours  Prednisone 5 mg QAM to start in AM 12/16  Consider increasing Lantus to 8 units BID (home dose).   Thanks, Bronson Curb, MSN, RNC-OB Diabetes Coordinator [phone] (8a-5p)

## 2020-12-11 NOTE — Progress Notes (Addendum)
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 failure   History of simultaneous kidney and pancreas transplant ([name])   Chronic kidney disease (CKD) stage G3a/A1, moderately decreased glomerular filtration rate (GFR) between 45-59 mL/min/1.73 square meter and albuminuria creatinine ratio less than 30 mg/g (HCC)   Hypertension   Acute kidney injury superimposed on CKD (HCC)   Type 1 diabetes mellitus with complication, with long term current use of insulin pump (HCC)   Pressure injury of skin   Long-term use of immunosuppressant medication   Sepsis ([place])   Hypoalbuminemia   Hyponatremia   CAP (community acquired pneumonia)   Acute urinary retention   Acute metabolic encephalopathy   Sacral decubitus ulcer, stage III (HCC)   Cellulitis   Hypotension   Malodorous urine   Erosive esophagitis   Hypothyroid   Depression   Type 1 diabetes mellitus with hyperlipidemia (HCC)   Malnutrition of moderate degree    Acute metabolic encephalopathy like related to infected sacral ulcer, resolved.    Continue delirium precautions.  Hypotension on presentation  Likely secondary to volume depletion, infection and adrenal insufficiency on chronic prednisone.  Responded well with IV fluids.  Metoprolol and  torsemide on hold.  Off IV fluids.  Improved  Cellulitis of unstageable sacral decubitus ulcer, present on admission.   CT of pelvis shows no evidence of osteomyelitis.  Patient did have features of infection on presentation.  On empiric daptomycin, cefepime and Flagyl.  Continue hydrotherapy.  Follow blood cultures- negative in 2 days. Plan for total of 7-day course of antibiotic.  Acute urinary retention with bacteriuria.  On Foley catheter.  Urine culture with 90,000 colonies of yeast, likely colonization.  Possible pneumonia cited on CXR.   chest x-ray is consistent with atelectasis versus pneumonia.  Patient is already on broad-spectrum antibiotic.  No pulmonary symptoms.  AKI on CKD stage III s/p renal transplant (1989), slowly improving.  Baseline creatinine 1.2-1.3.    Creatinine today at 1.4.  Has improved. .  Likely secondary to volume depletion infection, urinary retention.  Off fluids.  Nephrology on board, continue Imuran, prednisone and cyclosporine, sodium bicarb  Macrocytic anemia, hemoglobin stable and is at baseline.  B12 and folate within normal limits.  No bleeding   Type 1 diabetes status post post pancreas transplant (1989), poorly controlled with episodes of hypoglycemia as outpatient .   Last A1c 09/2020 7.2.    Used to be on the insulin pump in the past.  Currently on Lantus.  Continue sliding scale insulin.  Follows up with endocrine as outpatient.    Status post BKA (10/01/2020) for subacute osteomyelitis.    Mild erythema on the stump.  History of erosive esophagitis, stable.   Status post EGD on 11/10/2020.  Continue PPI twice daily  Hypothyroidism, -Continue Synthroid  Hyperlipidemia On Crestor.  Depression, stable  Continue Zoloft  Hypophosphatemia. Will  replace with K-Phos IV. Check levels in a.m.   Pressure ulceration. Present on admission. Sacral decubitus ulceration unstageable, left posterior hip stage II ulceration, left posterior heel  stage II ulceration. Continue wound care.  Pressure Injury 09/27/20 Sacrum Medial Unstageable - Full thickness tissue loss in which the base of the injury is covered by slough (yellow, tan, gray, green or brown) and/or eschar (tan, brown or black) in the wound bed. (Active)  09/27/20 1534  Location: Sacrum  Location Orientation: Medial  Staging: Unstageable - Full thickness tissue loss in which the base of the injury is covered by slough (yellow, tan, gray, green or brown) and/or eschar (tan, brown or black) in the wound bed.  Wound Description (Comments):   Present on Admission: Yes     Pressure Injury 12/10/20 Hip Left;Posterior;Lateral Stage 2 -  Partial thickness loss of dermis presenting as a shallow open injury with a red, pink wound bed without slough. (Active)  12/10/20 1325  Location: Hip  Location Orientation: Left;Posterior;Lateral  Staging: Stage 2 -  Partial thickness loss of dermis presenting as a shallow open injury with a red, pink wound bed without slough.  Wound Description (Comments):   Present on Admission: Yes (present on transfer to room 1402)     Pressure Injury 12/10/20 Heel Left;Posterior Stage 2 -  Partial thickness loss of dermis presenting as a shallow open injury with a red, pink wound bed without slough. (Active)  12/10/20 1325  Location: Heel  Location Orientation: Left;Posterior  Staging: Stage 2 -  Partial thickness loss of dermis presenting as a shallow open injury with a red, pink wound bed without slough.  Wound Description (Comments):   Present on Admission: Yes (upon transfer to 1402)   Moderate protein calorie malnutrition.  Present on admission.  Nutrition on board.  Continue nutritional supplements.   DVT prophylaxis: SCDs Start: 12/08/20 0053   Code Status: Full code  Family Communication:  Unable to reach on his home phone. Mobile phone with the patient.  Status is: Inpatient  Remains inpatient appropriate because:IV treatments  appropriate due to intensity of illness or inability to take PO and Inpatient level of care appropriate due to severity of illness, IV antibiotics, hydrotherapy  Dispo: The patient is from: SNF              Anticipated d/c is to: SNF, will need continued hydrotherapy. Will need a new skilled nursing facility.              Anticipated d/c date is: 2 days              Patient currently is not medically stable to d/c.   Consultants:  Nephrology  Wound care  Procedures:  None  Antibiotics:  . Daptomycin 12/15> . Vancomycin,12/12>12/15 .  cefepime and metronidazole 12/12>  Anti-infectives (From admission, onward)   Start     Dose/Rate Route Frequency Ordered Stop   12/10/20 2000  DAPTOmycin (CUBICIN) 300 mg in sodium chloride 0.9 % IVPB        300 mg 212 mL/hr over 30 Minutes Intravenous Daily 12/10/20 0946     12/08/20 2100  vancomycin (VANCOREADY) IVPB 500 mg/100 mL  Status:  Discontinued        500 mg 100 mL/hr over 60 Minutes Intravenous Every 24 hours 12/08/20 0040 12/10/20 0946   12/08/20 2000  ceFEPIme (MAXIPIME) 2 g in sodium chloride 0.9 % 100 mL IVPB        2 g 200 mL/hr over  30 Minutes Intravenous Every 24 hours 12/08/20 0036     12/08/20 1400  metroNIDAZOLE (FLAGYL) tablet 500 mg        500 mg Oral Every 8 hours 12/08/20 1049     12/08/20 0600  metroNIDAZOLE (FLAGYL) IVPB 500 mg  Status:  Discontinued        500 mg 100 mL/hr over 60 Minutes Intravenous Every 8 hours 12/07/20 2248 12/08/20 1049   12/07/20 2030  ceFEPIme (MAXIPIME) 2 g in sodium chloride 0.9 % 100 mL IVPB        2 g 200 mL/hr over 30 Minutes Intravenous  Once 12/07/20 2018 12/07/20 2105   12/07/20 2030  metroNIDAZOLE (FLAGYL) IVPB 500 mg        500 mg 100 mL/hr over 60 Minutes Intravenous  Once 12/07/20 2018 12/07/20 2321   12/07/20 2030  vancomycin (VANCOCIN) IVPB 1000 mg/200 mL premix        1,000 mg 200 mL/hr over 60 Minutes Intravenous  Once 12/07/20 2018 12/07/20 2212      Subjective: Today, patient was seen and examined at bedside. Complains of mild back discomfort but no fever chills nausea vomiting or diarrhea.  Objective: Vitals:   12/10/20 2119 12/11/20 0416  BP: 138/67 (!) 120/54  Pulse: 89 83  Resp: 15 14  Temp: 98 F (36.7 C) 97.8 F (36.6 C)  SpO2: 100% 99%    Intake/Output Summary (Last 24 hours) at 12/11/2020 0734 Last data filed at 12/11/2020 0051 Gross per 24 hour  Intake 496.31 ml  Output 1350 ml  Net -853.69 ml   Filed Weights   12/07/20 2356 12/11/20 0424  Weight: 52.2 kg 58 kg   Body mass index is 22.65 kg/m.   Physical Exam:  GENERAL: Patient is alert awake and communicative. Not in obvious distress. HENT: No scleral pallor or icterus. Pupils equally reactive to light. Oral mucosa is moist NECK: is supple, no gross swelling noted. CHEST: Clear to auscultation. No crackles or wheezes.  Diminished breath sounds bilaterally. CVS: S1 and S2 heard, no murmur. Regular rate and rhythm.  ABDOMEN: Soft, non-tender, bowel sounds are present. EXTREMITIES: Right below-knee amputation stump with mild erythema CNS: Cranial nerves are intact. No focal motor deficits. SKIN: warm and dry, sacral decubitus ulceration on admission, right below-knee amputation   Data Review: I have personally reviewed the following laboratory data and studies,  CBC: Recent Labs  Lab 12/07/20 2010 12/08/20 0407 12/09/20 0224 12/10/20 0236 12/11/20 0528  WBC 5.6 4.9 5.0 4.9 5.0  NEUTROABS 4.2 3.3  --   --   --   HGB 8.0* 8.3* 7.8* 7.0* 7.2*  HCT 25.1* 26.4* 24.4* 22.1* 23.0*  MCV 104.6* 106.0* 105.6* 107.3* 107.0*  PLT 246 210 204 175 706   Basic Metabolic Panel: Recent Labs  Lab 12/07/20 2010 12/07/20 2301 12/08/20 0407 12/09/20 0224 12/10/20 0236 12/11/20 0528  NA 132*  --  131* 135 131* 134*  K 4.2  --  4.1 4.0 4.1 3.7  CL 90*  --  91* 96* 95* 96*  CO2 29  --  26 27 25 27   GLUCOSE 118*  --  106* 131* 271* 335*  BUN 86*  --  76*  73* 76* 71*  CREATININE 1.97*  --  1.78* 1.79* 1.70* 1.40*  CALCIUM 8.9  --  8.7* 8.7* 8.4* 8.5*  MG  --  1.9 1.8  --   --  1.8  PHOS  --  3.9 3.3  --   --  1.9*   Liver Function Tests: Recent Labs  Lab 12/07/20 2010 12/08/20 0407 12/10/20 0236 12/11/20 0528  AST 14* 13* 15 13*  ALT 9 9 8 8   ALKPHOS 82 78 73 69  BILITOT 0.8 0.8 0.7 0.7  PROT 5.4* 5.2* 4.8* 4.7*  ALBUMIN 2.0* 1.9* 1.8* 1.6*   No results for input(s): LIPASE, AMYLASE in the last 168 hours. No results for input(s): AMMONIA in the last 168 hours. Cardiac Enzymes: Recent Labs  Lab 12/07/20 2301 12/10/20 0236  CKTOTAL 11* 10*   BNP (last 3 results) No results for input(s): BNP in the last 8760 hours.  ProBNP (last 3 results) No results for input(s): PROBNP in the last 8760 hours.  CBG: Recent Labs  Lab 12/10/20 1212 12/10/20 1618 12/10/20 2150 12/10/20 2357 12/11/20 0413  GLUCAP 287* 310* 316* 331* 291*   Recent Results (from the past 240 hour(s))  Urine culture     Status: Abnormal   Collection Time: 12/07/20  8:06 PM   Specimen: Urine, Catheterized  Result Value Ref Range Status   Specimen Description   Final    URINE, CATHETERIZED Performed at [facility] [address], [place], [place] [address]    Special Requests   Final    NONE Performed at [facility], [place] [address] [place], [place] [address]    Culture 90,000 COLONIES/mL YEAST (A)  Final   Report Status 12/08/2020 FINAL  Final  Blood culture (routine single)     Status: None (Preliminary result)   Collection Time: 12/07/20  8:10 PM   Specimen: BLOOD  Result Value Ref Range Status   Specimen Description   Final    BLOOD BLOOD LEFT FOREARM Performed at [facility] [address], [place], [place] [address]    Special Requests   Final    BOTTLES DRAWN AEROBIC AND ANAEROBIC Blood Culture results may not be optimal due to an excessive volume of blood received in culture  bottles Performed at [facility] [address], [place], [place] [address]    Culture   Final    NO GROWTH 2 DAYS Performed at [facility] [address] [place], [place] [address]    Report Status PENDING  Incomplete  Resp Panel by RT-PCR (Flu A&B, Covid) Nasopharyngeal Swab     Status: None   Collection Time: 12/07/20  8:57 PM   Specimen: Nasopharyngeal Swab; Nasopharyngeal(NP) swabs in vial transport medium  Result Value Ref Range Status   SARS Coronavirus 2 by RT PCR NEGATIVE NEGATIVE Final    Comment: (NOTE) SARS-CoV-2 target nucleic acids are NOT DETECTED.  The SARS-CoV-2 RNA is generally detectable in upper respiratory specimens during the acute phase of infection. The lowest concentration of SARS-CoV-2 viral copies this assay can detect is 138 copies/mL. A negative result does not preclude SARS-Cov-2 infection and should not be used as the sole basis for treatment or other patient management decisions. A negative result may occur with  improper specimen collection/handling, submission of specimen other than nasopharyngeal swab, presence of viral mutation(s) within the areas targeted by this assay, and inadequate number of viral copies(<138 copies/mL). A negative result must be combined with clinical observations, patient history, and epidemiological information. The expected result is Negative.  Fact Sheet for Patients:  EntrepreneurPulse.com.au  Fact Sheet for Healthcare Providers:  IncredibleEmployment.be  This test is no t yet approved or cleared by the [place] and  has been authorized for  detection and/or diagnosis of SARS-CoV-2 by FDA under an Emergency Use Authorization (EUA). This EUA will remain  in effect (meaning this test can be used) for the duration of the COVID-19 declaration under Section 564(b)(1) of the Act, 21 U.S.C.section 360bbb-3(b)(1), unless the authorization is  terminated  or revoked sooner.       Influenza A by PCR NEGATIVE NEGATIVE Final   Influenza B by PCR NEGATIVE NEGATIVE Final    Comment: (NOTE) The Xpert Xpress SARS-CoV-2/FLU/RSV plus assay is intended as an aid in the diagnosis of influenza from Nasopharyngeal swab specimens and should not be used as a sole basis for treatment. Nasal washings and aspirates are unacceptable for Xpert Xpress SARS-CoV-2/FLU/RSV testing.  Fact Sheet for Patients: EntrepreneurPulse.com.au  Fact Sheet for Healthcare Providers: IncredibleEmployment.be  This test is not yet approved or cleared by the [place] FDA and has been authorized for detection and/or diagnosis of SARS-CoV-2 by FDA under an Emergency Use Authorization (EUA). This EUA will remain in effect (meaning this test can be used) for the duration of the COVID-19 declaration under Section 564(b)(1) of the Act, 21 U.S.C. section 360bbb-3(b)(1), unless the authorization is terminated or revoked.  Performed at [facility], [place] [address], [place], [place] [address]   MRSA PCR Screening     Status: Abnormal   Collection Time: 12/08/20  1:53 PM   Specimen: Nasopharyngeal  Result Value Ref Range Status   MRSA by PCR POSITIVE (A) NEGATIVE Final    Comment:        The GeneXpert MRSA Assay (FDA approved for NASAL specimens only), is one component of a comprehensive MRSA colonization surveillance program. It is not intended to diagnose MRSA infection nor to guide or monitor treatment for MRSA infections. RESULT CALLED TO, READ BACK BY AND VERIFIED WITH: ROBINSON,A. RN @1614  12/08/20 BILLINGSLEY,L Performed at [facility], [place] [address] [place],  [address]      Studies: No results found.    Flora Lipps, MD  [facility] 12/11/2020  If 7PM-7AM, please contact night-coverage

## 2020-12-11 NOTE — Progress Notes (Signed)
[facility] Progress Note  Subjective: creat down 1.4 today, in good spiritis   Vitals:   12/11/20 0416 12/11/20 0424 12/11/20 0838 12/11/20 1300  BP: (!) 120/54  138/72 133/78  Pulse: 83  80 77  Resp: 14  16 18   Temp: 97.8 F (36.6 C)  97.6 F (36.4 C) (!) 97 F (36.1 C)  TempSrc: Oral  Oral Oral  SpO2: 99%  100% 100%  Weight:  58 kg    Height:        Exam:   Frail, responsive and pleasant  no jvd  Chest cta bilat  Cor reg no RG  Abd soft ntnd no ascites   Ext L bka, 1-2+ edema today, mult bruises   Neuro gen'd weakness, nonfocal       Home meds:  - imuran 50 qd/ cyclosporine 75 bid  - lantus insulin 8u bid/ humalog 2u tid ac  - crestor 5/ sod bicarb bid  - demadex 20 qd/ metoprolol 12.5 bid  - zoloft 25  - prn's/ vitamins/ supplements     IMPRESSION: Sacral decubitus ulcer. No evidence of osteomyelitis. Apparent renal transplant within the cul-de-sac of the pelvis. Mild caliectasis noted within the renal transplant. Urinary bladder is markedly distended to the level of the umbilicus. Aortoiliac atherosclerosis.        UA 12/12 - >50 wbc, 6-10 rbc, many bact, yeast ++, prot100  Assessment/ Plan: 1. AKI renal transplant - b/l creat 1.1- 1.5 from 2021. Hx of recent AKI in Nov 2021.   AKI here due to vol depletion, bladder retention +/- other. CT showed markedly enlarged bladder. Foley cath placed but creat did not improve w/ foley placement. Good UOP. IVF"s dc'd yest due to edema. Creat better this am down to 1.40, close to her range. Cont off of IVF's for now. No new suggestions. She can f/u w/ her renal MD after discharge. Will sign off.   2. Hx of renal /panc transplant - cont renal Tx IS meds (CyA, pred, imuran) 3. Infected sacral decub - on IV abx, rec change IV vanc to dapto for renal protection (pharm will do this).  4. DM 2 on insulin 5. FTT - 3 admits in last 60 day. Extremely frail on exam. GOC pall consult ordered.  6. HTN - BP's  okay 7. Anemia ckd / chronic illness - Hb ~8    Rob Evamarie Raetz 12/11/2020, 3:24 PM   Recent Labs  Lab 12/08/20 0407 12/09/20 0224 12/10/20 0236 12/11/20 0528  K 4.1   < > 4.1 3.7  BUN 76*   < > 76* 71*  CREATININE 1.78*   < > 1.70* 1.40*  CALCIUM 8.7*   < > 8.4* 8.5*  PHOS 3.3  --   --  1.9*  HGB 8.3*   < > 7.0* 7.2*   < > = values in this interval not displayed.   Inpatient medications: . azaTHIOprine  50 mg Oral Daily  . Chlorhexidine Gluconate Cloth  6 each Topical Daily  . Chlorhexidine Gluconate Cloth  6 each Topical Q0600  . collagenase   Topical Daily  . cycloSPORINE  75 mg Oral BID  . feeding supplement  237 mL Oral BID BM  . insulin aspart  0-9 Units Subcutaneous Q4H  . insulin glargine  8 Units Subcutaneous BID  . levothyroxine  50 mcg Oral Q0600  . mouth rinse  15 mL Mouth Rinse BID  . metroNIDAZOLE  500 mg Oral Q8H  . multivitamin with minerals  1 tablet Oral Daily  . mupirocin ointment  1 application Nasal BID  . nutrition supplement (JUVEN)  1 packet Oral BID BM  . nystatin   Topical TID  . pantoprazole  40 mg Oral BID  . predniSONE  5 mg Oral Q breakfast  . rosuvastatin  5 mg Oral QHS  . sertraline  25 mg Oral Daily  . sodium bicarbonate  650 mg Oral BID   . ceFEPime (MAXIPIME) IV 2 g (12/11/20 1428)  . DAPTOmycin (CUBICIN)  IV 300 mg (12/10/20 2040)  . potassium PHOSPHATE IVPB (in mmol)     acetaminophen **OR** acetaminophen, HYDROcodone-acetaminophen

## 2020-12-11 NOTE — TOC Progression Note (Addendum)
Transition of Care [facility]) - Progression Note    Patient Details  Name: [name] MRN: [id number] Date of Birth: [birth year]  Transition of Care [facility]) CM/SW Contact  Ebonie Westerlund, Juliann Pulse, RN Phone Number: 12/11/2020, 10:09 AM  Clinical Narrative:From ST SNF-[facility] rep Britany following.Will explore with patient/spouse about returning back or another SNF;informed that since getting hydrotherapy-this is not a skill that can be done @ home.Will confirm d/c plans.Permission to fax out to SNF.    12p-LTACH screen referral sent await outcome-[facility].     Expected Discharge Plan: [place] Barriers to Discharge: Continued Medical Work up  Expected Discharge Plan and Services Expected Discharge Plan: [place]   Discharge Planning Services: CM Consult Post Acute Care Choice: [place] Living arrangements for the past 2 months: Single Family Home                                       Social Determinants of Health (SDOH) Interventions    Readmission Risk Interventions No flowsheet data found.

## 2020-12-12 LAB — CBC
HCT: 23.4 % — ABNORMAL LOW (ref 36.0–46.0)
Hemoglobin: 7.3 g/dL — ABNORMAL LOW (ref 12.0–15.0)
MCH: 34 pg (ref 26.0–34.0)
MCHC: 31.2 g/dL (ref 30.0–36.0)
MCV: 108.8 fL — ABNORMAL HIGH (ref 80.0–100.0)
Platelets: 127 10*3/uL — ABNORMAL LOW (ref 150–400)
RBC: 2.15 MIL/uL — ABNORMAL LOW (ref 3.87–5.11)
RDW: 19.4 % — ABNORMAL HIGH (ref 11.5–15.5)
WBC: 4.3 10*3/uL (ref 4.0–10.5)
nRBC: 0.5 % — ABNORMAL HIGH (ref 0.0–0.2)

## 2020-12-12 LAB — BASIC METABOLIC PANEL
Anion gap: 9 (ref 5–15)
BUN: 61 mg/dL — ABNORMAL HIGH (ref 6–20)
CO2: 25 mmol/L (ref 22–32)
Calcium: 8.6 mg/dL — ABNORMAL LOW (ref 8.9–10.3)
Chloride: 101 mmol/L (ref 98–111)
Creatinine, Ser: 1.19 mg/dL — ABNORMAL HIGH (ref 0.44–1.00)
GFR, Estimated: 52 mL/min — ABNORMAL LOW (ref >60)
Glucose, Bld: 176 mg/dL — ABNORMAL HIGH (ref 70–99)
Potassium: 4.5 mmol/L (ref 3.5–5.1)
Sodium: 135 mmol/L (ref 135–145)

## 2020-12-12 LAB — GLUCOSE, CAPILLARY
Glucose-Capillary: 169 mg/dL — ABNORMAL HIGH (ref 70–99)
Glucose-Capillary: 187 mg/dL — ABNORMAL HIGH (ref 70–99)
Glucose-Capillary: 195 mg/dL — ABNORMAL HIGH (ref 70–99)
Glucose-Capillary: 202 mg/dL — ABNORMAL HIGH (ref 70–99)
Glucose-Capillary: 207 mg/dL — ABNORMAL HIGH (ref 70–99)
Glucose-Capillary: 302 mg/dL — ABNORMAL HIGH (ref 70–99)

## 2020-12-12 LAB — MAGNESIUM: Magnesium: 1.7 mg/dL (ref 1.7–2.4)

## 2020-12-12 LAB — PHOSPHORUS: Phosphorus: 2.2 mg/dL — ABNORMAL LOW (ref 2.5–4.6)

## 2020-12-12 MED ORDER — K PHOS MONO-SOD PHOS DI & MONO 155-852-130 MG PO TABS
500.0000 mg | ORAL_TABLET | Freq: Three times a day (TID) | ORAL | Status: DC
  Administered 2020-12-12 – 2020-12-13 (×5): 500 mg via ORAL
  Filled 2020-12-12 (×7): qty 2

## 2020-12-12 MED ORDER — ONDANSETRON HCL 4 MG/2ML IJ SOLN: 4.0000 mg | Freq: Four times a day (QID) | INTRAMUSCULAR | Status: DC | PRN

## 2020-12-12 NOTE — TOC Progression Note (Signed)
Transition of Care [facility]) - Progression Note    Patient Details  Name: [name] MRN: [id number] Date of Birth: 1961-04-22  Transition of Care [facility]) CM/SW Contact  Dennice Tindol, Juliann Pulse, RN Phone Number: 12/12/2020, 9:30 AM  Clinical Narrative: LTACH @ [facility] submitted auth-awaiting outcome rep Raquel Sarna.      Expected Discharge Plan: Long Term Acute Care (LTAC) Barriers to Discharge: Insurance Authorization  Expected Discharge Plan and Services Expected Discharge Plan: Long Term Acute Care (LTAC)   Discharge Planning Services: CM Consult Post Acute Care Choice: [place] Living arrangements for the past 2 months: Single Family Home                                       Social Determinants of Health (SDOH) Interventions    Readmission Risk Interventions No flowsheet data found.

## 2020-12-12 NOTE — Progress Notes (Signed)
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 failure   History of simultaneous kidney and pancreas transplant ([place])   Chronic kidney disease (CKD) stage G3a/A1, moderately decreased glomerular filtration rate (GFR) between 45-59 mL/min/1.73 square meter and albuminuria creatinine ratio less than 30 mg/g (HCC)   Hypertension   Acute kidney injury superimposed on CKD (HCC)   Type 1 diabetes mellitus with complication, with long term current use of insulin pump (HCC)   Pressure injury of skin   Long-term use of immunosuppressant medication   Sepsis ([place])   Hypoalbuminemia   Hyponatremia   CAP (community acquired pneumonia)   Acute urinary retention   Acute metabolic encephalopathy   Sacral decubitus ulcer, stage III (HCC)   Cellulitis   Hypotension   Malodorous urine   Erosive esophagitis   Hypothyroid   Depression   Type 1 diabetes mellitus with hyperlipidemia (HCC)   Malnutrition of moderate degree  Acute metabolic encephalopathy like related to infected sacral ulcer, resolved.    Continue delirium precautions.  Hypotension on presentation  Likely secondary to volume depletion, infection and adrenal insufficiency on chronic prednisone.  Responded well with IV fluids.  Metoprolol and  torsemide on hold.  Off IV fluids.  Improved  Cellulitis of unstageable sacral decubitus ulcer, present on admission.   CT of pelvis shows no evidence of osteomyelitis.  On empiric daptomycin, cefepime and Flagyl for local site infection.  Continue hydrotherapy.  Follow blood cultures- negative in 4 days. Plan for total of 7-day course of antibiotic.  Acute urinary retention with bacteriuria.  On Foley catheter.  Urine culture with 90,000 colonies of yeast, likely colonization.  Possible pneumonia cited on CXR.   chest x-ray was consistent with atelectasis versus pneumonia.  Patient is already on broad-spectrum antibiotic.  No pulmonary symptoms.  AKI on CKD stage III s/p renal transplant (1989), slowly improving.  Baseline creatinine 1.2-1.3.    Creatinine today at 1.1. continue Imuran, prednisone and cyclosporine, sodium bicarb  Macrocytic anemia, hemoglobin stable and is at baseline.  B12 and folate within normal limits.  No bleeding .  Latest hemoglobin was 7.0.  Check in a.m.  Type 1 diabetes status post post pancreas transplant (1989), poorly controlled with episodes of hypoglycemia as outpatient .   Last A1c 09/2020 was 7.2.    Used to be on the insulin pump in the past.  Currently on Lantus, sliding scale insulin.  Follows up with endocrine as outpatient.    Status post BKA (10/01/2020) for subacute osteomyelitis.    Mild erythema on the stump.  History of erosive esophagitis, stable.   Status post EGD on 11/10/2020.  Continue PPI twice daily  Hypothyroidism, -Continue Synthroid  Hyperlipidemia On Crestor.  Depression, stable  Continue Zoloft  Hypophosphatemia.  Replenished with K-Phos.  Phosphate level improved but still on the lower side.  We will continue with oral  supplement.  Pressure ulceration. Present on admission. Sacral decubitus ulceration unstageable, left posterior hip stage II ulceration, left posterior heel stage II ulceration. Continue wound  care.  Pressure Injury 09/27/20 Sacrum Medial Unstageable - Full thickness tissue loss in which the base of the injury is covered by slough (yellow, tan, gray, green or brown) and/or eschar (tan, brown or black) in the wound bed. (Active)  09/27/20 1534  Location: Sacrum  Location Orientation: Medial  Staging: Unstageable - Full thickness tissue loss in which the base of the injury is covered by slough (yellow, tan, gray, green or brown) and/or eschar (tan, brown or black) in the wound bed.  Wound Description (Comments):   Present on Admission: Yes     Pressure Injury 12/10/20 Hip Left;Posterior;Lateral Stage 2 -  Partial thickness loss of dermis presenting as a shallow open injury with a red, pink wound bed without slough. (Active)  12/10/20 1325  Location: Hip  Location Orientation: Left;Posterior;Lateral  Staging: Stage 2 -  Partial thickness loss of dermis presenting as a shallow open injury with a red, pink wound bed without slough.  Wound Description (Comments):   Present on Admission: Yes (present on transfer to room 1402)     Pressure Injury 12/10/20 Heel Left;Posterior Stage 2 -  Partial thickness loss of dermis presenting as a shallow open injury with a red, pink wound bed without slough. (Active)  12/10/20 1325  Location: Heel  Location Orientation: Left;Posterior  Staging: Stage 2 -  Partial thickness loss of dermis presenting as a shallow open injury with a red, pink wound bed without slough.  Wound Description (Comments):   Present on Admission: Yes (upon transfer to 1402)   Moderate protein calorie malnutrition.  Present on admission.  Nutrition on board.  Continue nutritional supplements.   DVT prophylaxis: SCDs Start: 12/08/20 0053   Code Status: Full code  Family Communication: None today.  Spoke with the patient at bedside   status is: Inpatient  Remains inpatient appropriate because:IV treatments appropriate due to intensity of illness or inability to take PO  and Inpatient level of care appropriate due to severity of illness, IV antibiotics, hydrotherapy  Dispo: The patient is from: SNF              Anticipated d/c is to: Likely to LTAC as per transition of care for hydrotherapy and continued care              Anticipated d/c date is: 2 days              Patient currently is medically stable to d/c for LTAC care.   Consultants:  Nephrology  Wound care  Procedures:  None  Antibiotics:  . Daptomycin 12/15> . Vancomycin,12/12>12/15 .  cefepime and metronidazole 12/12>  Anti-infectives (From admission, onward)   Start     Dose/Rate Route Frequency Ordered Stop   12/11/20 1500  ceFEPIme (MAXIPIME) 2 g in sodium chloride 0.9 % 100 mL IVPB        2 g 200 mL/hr over 30 Minutes Intravenous Every 12 hours 12/11/20 1358     12/10/20 2000  DAPTOmycin (CUBICIN) 300 mg in sodium chloride 0.9 % IVPB        300 mg 212 mL/hr over 30 Minutes Intravenous Daily 12/10/20 0946     12/08/20 2100  vancomycin (VANCOREADY) IVPB 500 mg/100 mL  Status:  Discontinued        500 mg 100 mL/hr over 60 Minutes Intravenous Every 24 hours 12/08/20 0040  12/10/20 0946   12/08/20 2000  ceFEPIme (MAXIPIME) 2 g in sodium chloride 0.9 % 100 mL IVPB  Status:  Discontinued        2 g 200 mL/hr over 30 Minutes Intravenous Every 24 hours 12/08/20 0036 12/11/20 1358   12/08/20 1400  metroNIDAZOLE (FLAGYL) tablet 500 mg        500 mg Oral Every 8 hours 12/08/20 1049     12/08/20 0600  metroNIDAZOLE (FLAGYL) IVPB 500 mg  Status:  Discontinued        500 mg 100 mL/hr over 60 Minutes Intravenous Every 8 hours 12/07/20 2248 12/08/20 1049   12/07/20 2030  ceFEPIme (MAXIPIME) 2 g in sodium chloride 0.9 % 100 mL IVPB        2 g 200 mL/hr over 30 Minutes Intravenous  Once 12/07/20 2018 12/07/20 2105   12/07/20 2030  metroNIDAZOLE (FLAGYL) IVPB 500 mg        500 mg 100 mL/hr over 60 Minutes Intravenous  Once 12/07/20 2018 12/07/20 2321   12/07/20 2030  vancomycin (VANCOCIN)  IVPB 1000 mg/200 mL premix        1,000 mg 200 mL/hr over 60 Minutes Intravenous  Once 12/07/20 2018 12/07/20 2212     Subjective: Today, patient was seen and examined at bedside.  Denies any nausea vomiting fever chills or rigor. Objective: Vitals:   12/11/20 2001 12/12/20 0446  BP: (!) 166/80 125/85  Pulse: 76 95  Resp: 16 18  Temp: (!) 97.5 F (36.4 C) 98.4 F (36.9 C)  SpO2: 98% 97%    Intake/Output Summary (Last 24 hours) at 12/12/2020 1230 Last data filed at 12/12/2020 0500 Gross per 24 hour  Intake 806 ml  Output 1600 ml  Net -794 ml   Filed Weights   12/07/20 2356 12/11/20 0424  Weight: 52.2 kg 58 kg   Body mass index is 22.65 kg/m.   Physical Exam:  General:  Average built, not in obvious distress, alert awake and communicative. HENT:   No scleral pallor or icterus noted. Oral mucosa is moist.  Chest:  Clear breath sounds.  Diminished breath sounds bilaterally. No crackles or wheezes.  CVS: S1 &S2 heard. No murmur.  Regular rate and rhythm. Abdomen: Soft, nontender, nondistended.  Bowel sounds are heard.   Extremities: Right below-knee amputation stump with mild erythema.  Peripheral pulses are palpable. Psych: Alert, awake and oriented, normal mood CNS:  No cranial nerve deficits.  Power equal in all extremities.   Skin:   Right below-knee amputation with stump, sacral decubitus ulceration present on admission.   Data Review: I have personally reviewed the following laboratory data and studies,  CBC: Recent Labs  Lab 12/07/20 2010 12/08/20 0407 12/09/20 0224 12/10/20 0236 12/11/20 0528  WBC 5.6 4.9 5.0 4.9 5.0  NEUTROABS 4.2 3.3  --   --   --   HGB 8.0* 8.3* 7.8* 7.0* 7.2*  HCT 25.1* 26.4* 24.4* 22.1* 23.0*  MCV 104.6* 106.0* 105.6* 107.3* 107.0*  PLT 246 210 204 175 803   Basic Metabolic Panel: Recent Labs  Lab 12/07/20 2301 12/08/20 0407 12/09/20 0224 12/10/20 0236 12/11/20 0528 12/12/20 1056  NA  --  131* 135 131* 134* 135  K  --   4.1 4.0 4.1 3.7 4.5  CL  --  91* 96* 95* 96* 101  CO2  --  26 27 25 27 25   GLUCOSE  --  106* 131* 271* 335* 176*  BUN  --  76* 73* 76*  71* 61*  CREATININE  --  1.78* 1.79* 1.70* 1.40* 1.19*  CALCIUM  --  8.7* 8.7* 8.4* 8.5* 8.6*  MG 1.9 1.8  --   --  1.8 1.7  PHOS 3.9 3.3  --   --  1.9* 2.2*   Liver Function Tests: Recent Labs  Lab 12/07/20 2010 12/08/20 0407 12/10/20 0236 12/11/20 0528  AST 14* 13* 15 13*  ALT 9 9 8 8   ALKPHOS 82 78 73 69  BILITOT 0.8 0.8 0.7 0.7  PROT 5.4* 5.2* 4.8* 4.7*  ALBUMIN 2.0* 1.9* 1.8* 1.6*   No results for input(s): LIPASE, AMYLASE in the last 168 hours. No results for input(s): AMMONIA in the last 168 hours. Cardiac Enzymes: Recent Labs  Lab 12/07/20 2301 12/10/20 0236  CKTOTAL 11* 10*   BNP (last 3 results) No results for input(s): BNP in the last 8760 hours.  ProBNP (last 3 results) No results for input(s): PROBNP in the last 8760 hours.  CBG: Recent Labs  Lab 12/11/20 1713 12/11/20 1957 12/11/20 2347 12/12/20 0356 12/12/20 0731  GLUCAP 267* 278* 280* 302* 187*   Recent Results (from the past 240 hour(s))  Urine culture     Status: Abnormal   Collection Time: 12/07/20  8:06 PM   Specimen: Urine, Catheterized  Result Value Ref Range Status   Specimen Description   Final    URINE, CATHETERIZED Performed at [facility], [place] [address], [place], [place] [address]    Special Requests   Final    NONE Performed at [facility], [place] [address], [place], [place] [address]    Culture 90,000 COLONIES/mL YEAST (A)  Final   Report Status 12/08/2020 FINAL  Final  Blood culture (routine single)     Status: None (Preliminary result)   Collection Time: 12/07/20  8:10 PM   Specimen: BLOOD  Result Value Ref Range Status   Specimen Description   Final    BLOOD BLOOD LEFT FOREARM Performed at [place] [facility] [address], [place], [place] [address]    Special Requests   Final     BOTTLES DRAWN AEROBIC AND ANAEROBIC Blood Culture results may not be optimal due to an excessive volume of blood received in culture bottles Performed at [place] [address], [place], [place] [address]    Culture   Final    NO GROWTH 4 DAYS Performed at [facility], [place] [address], [place],  [address]    Report Status PENDING  Incomplete  Resp Panel by RT-PCR (Flu A&B, Covid) Nasopharyngeal Swab     Status: None   Collection Time: 12/07/20  8:57 PM   Specimen: Nasopharyngeal Swab; Nasopharyngeal(NP) swabs in vial transport medium  Result Value Ref Range Status   SARS Coronavirus 2 by RT PCR NEGATIVE NEGATIVE Final    Comment: (NOTE) SARS-CoV-2 target nucleic acids are NOT DETECTED.  The SARS-CoV-2 RNA is generally detectable in upper respiratory specimens during the acute phase of infection. The lowest concentration of SARS-CoV-2 viral copies this assay can detect is 138 copies/mL. A negative result does not preclude SARS-Cov-2 infection and should not be used as the sole basis for treatment or other patient management decisions. A negative result may occur with  improper specimen collection/handling, submission of specimen other than nasopharyngeal swab, presence of viral mutation(s) within the areas targeted by this assay, and inadequate number of viral copies(<138 copies/mL). A negative result must be combined with clinical observations, patient  history, and epidemiological information. The expected result is Negative.  Fact Sheet for Patients:  EntrepreneurPulse.com.au  Fact Sheet for Healthcare Providers:  IncredibleEmployment.be  This test is no t yet approved or cleared by the [place] FDA and  has been authorized for detection and/or diagnosis of SARS-CoV-2 by FDA under an Emergency Use Authorization (EUA). This EUA will remain  in effect (meaning this test can be used) for the duration  of the COVID-19 declaration under Section 564(b)(1) of the Act, 21 U.S.C.section 360bbb-3(b)(1), unless the authorization is terminated  or revoked sooner.       Influenza A by PCR NEGATIVE NEGATIVE Final   Influenza B by PCR NEGATIVE NEGATIVE Final    Comment: (NOTE) The Xpert Xpress SARS-CoV-2/FLU/RSV plus assay is intended as an aid in the diagnosis of influenza from Nasopharyngeal swab specimens and should not be used as a sole basis for treatment. Nasal washings and aspirates are unacceptable for Xpert Xpress SARS-CoV-2/FLU/RSV testing.  Fact Sheet for Patients: EntrepreneurPulse.com.au  Fact Sheet for Healthcare Providers: IncredibleEmployment.be  This test is not yet approved or cleared by the [place] FDA and has been authorized for detection and/or diagnosis of SARS-CoV-2 by FDA under an Emergency Use Authorization (EUA). This EUA will remain in effect (meaning this test can be used) for the duration of the COVID-19 declaration under Section 564(b)(1) of the Act, 21 U.S.C. section 360bbb-3(b)(1), unless the authorization is terminated or revoked.  Performed at [facility], [place] [address] [place], [place] [address]   MRSA PCR Screening     Status: Abnormal   Collection Time: 12/08/20  1:53 PM   Specimen: Nasopharyngeal  Result Value Ref Range Status   MRSA by PCR POSITIVE (A) NEGATIVE Final    Comment:        The GeneXpert MRSA Assay (FDA approved for NASAL specimens only), is one component of a comprehensive MRSA colonization surveillance program. It is not intended to diagnose MRSA infection nor to guide or monitor treatment for MRSA infections. RESULT CALLED TO, READ BACK BY AND VERIFIED WITH: ROBINSON,A. RN @1614  12/08/20 BILLINGSLEY,L Performed at [facility], [place] [address], [place], [place] [address]      Studies: No results found.    Flora Lipps, MD  [facility] 12/12/2020  If 7PM-7AM, please contact night-coverage

## 2020-12-12 NOTE — Progress Notes (Signed)
   12/12/20 1200  Subjective Assessment  Subjective I am sorry that I yell..  Patient and Family Stated Goals agreed to wound care  Date of Onset  (present on this admission)  Prior Treatments PLS 1 month ago,dressing changes  Evaluation and Treatment  Evaluation and Treatment Procedures Explained to Patient/Family Yes  Evaluation and Treatment Procedures agreed to  Wound / Incision (Open or Dehisced) 12/09/20 Other (Comment) Sacrum Medial open nonstageable sacral wound, malodorous with slough.  Date First Assessed: 12/09/20   Wound Type: Other (Comment)  Location: Sacrum  Location Orientation: Medial  Wound Description (Comments): open nonstageable sacral wound, malodorous with slough.  Dressing Type Foam - Lift dressing to assess site every shift  Dressing Changed New  Dressing Status Clean;Dry;Intact  Hydrotherapy  Pulsed Lavage with Suction (psi) 8 psi  Pulsed Lavage with Suction - Normal Saline Used 1000 mL  Pulsed Lavage Tip Tip with splash shield  Pulsed lavage therapy - wound location sacrum  Selective Debridement  Selective Debridement - Location sacrum  Selective Debridement - Tools Used Forceps;Scissors  Selective Debridement - Tissue Removed slough and eschar  Wound Therapy - Assess/Plan/Recommendations  Wound Therapy - Clinical Statement Ppatient tolerated procedure well. Wound Continues to have dark stringy slough at proximal end with undermining.Yellow fibrinous slough at 3:00. Patient will benefit from hydrotherapy to improve wound qualities.  Wound Therapy - Functional Problem List bed bound  Factors Delaying/Impairing Wound Healing Incontinence;Multiple medical problems  Hydrotherapy Plan Debridement;Dressing change;Pulsatile lavage with suction  Wound Therapy - Frequency 6X / week  Wound Therapy - Current Recommendations Case manager/social work  Wound Therapy - Follow Up Recommendations Skilled nursing facility (LTACH)  Wound Plan PLS, debridement, dressing change   Wound Therapy Goals - Improve the function of patient's integumentary system by progressing the wound(s) through the phases of wound healing by:  Decrease Necrotic Tissue to 25  Increase Granulation Tissue to 75  Patient/Family will be able to  reposition on sides  Goals/treatment plan/discharge plan were made with and agreed upon by patient/family Yes  Time For Goal Achievement 2 weeks  Wound Therapy - Potential for Goals Fair  Rica Koyanagi  PTA Acute  Rehabilitation Services Pager      [phone] Office      [phone]

## 2020-12-13 LAB — CULTURE, BLOOD (SINGLE): Culture: NO GROWTH

## 2020-12-13 LAB — GLUCOSE, CAPILLARY
Glucose-Capillary: 140 mg/dL — ABNORMAL HIGH (ref 70–99)
Glucose-Capillary: 121 mg/dL — ABNORMAL HIGH (ref 70–99)
Glucose-Capillary: 139 mg/dL — ABNORMAL HIGH (ref 70–99)
Glucose-Capillary: 153 mg/dL — ABNORMAL HIGH (ref 70–99)
Glucose-Capillary: 249 mg/dL — ABNORMAL HIGH (ref 70–99)

## 2020-12-13 NOTE — Plan of Care (Signed)

## 2020-12-13 NOTE — TOC Progression Note (Signed)
Transition of Care [facility]) - Progression Note    Patient Details  Name: [name] MRN: [id number] Date of Birth: May 23, 1961  [facility] CM/SW Contact  Lennart Pall, LCSW Phone Number: 12/13/2020, 9:22 AM  Clinical Narrative:    Have spoken with [facility] rep, Raquel Sarna [phone]) who reports insurance Josem Kaufmann is still pending.  She will alert TOC as soon as she receives [place].   Expected Discharge Plan: Long Term Acute Care (LTAC) Barriers to Discharge: Insurance Authorization  Expected Discharge Plan and Services Expected Discharge Plan: Long Term Acute Care (LTAC)   Discharge Planning Services: CM Consult Post Acute Care Choice: [place] Living arrangements for the past 2 months: Single Family Home                                       Social Determinants of Health (SDOH) Interventions    Readmission Risk Interventions No flowsheet data found.

## 2020-12-13 NOTE — Progress Notes (Signed)
HYDROTHERAPY TREATMENT       12/13/20 1200  Subjective Assessment  Subjective i'm okay  Patient and Family Stated Goals agreed to wound care  Date of Onset  (present on admission)  Prior Treatments PLS/hydrotherapy with PT last admission  Evaluation and Treatment  Evaluation and Treatment Procedures Explained to Patient/Family Yes  Evaluation and Treatment Procedures agreed to  Wound / Incision (Open or Dehisced) 12/09/20 Other (Comment) Sacrum Medial open nonstageable sacral wound, malodorous with slough.  Date First Assessed: 12/09/20   Wound Type: Other (Comment)  Location: Sacrum  Location Orientation: Medial  Wound Description (Comments): open nonstageable sacral wound, malodorous with slough.  Dressing Type Foam - Lift dressing to assess site every shift;Moist to moist  Dressing Changed Changed  Dressing Status Old drainage  Dressing Change Frequency Daily  Site / Wound Assessment Bleeding;Pink;Yellow;Brown;Black  % Wound base Red or Granulating 50%  % Wound base Yellow/Fibrinous Exudate 45% (some is possibly fibrous tissue)  % Wound base Black/Eschar 5%  Peri-wound Assessment Maceration;Erythema (non-blanchable)  Margins Unattached edges (unapproximated)  Drainage Amount Moderate  Drainage Description Serosanguineous  Treatment Debridement (Selective);Hydrotherapy (Pulse lavage);Packing (Saline gauze) (Santyl)  Hydrotherapy  Pulsed Lavage with Suction (psi) 8 psi  Pulsed Lavage with Suction - Normal Saline Used 1000 mL  Pulsed Lavage Tip Tip with splash shield  Pulsed lavage therapy - wound location sacrum  Selective Debridement  Selective Debridement - Location sacrum  Selective Debridement - Tools Used Forceps;Scissors  Selective Debridement - Tissue Removed slough and eschar  Wound Therapy - Assess/Plan/Recommendations  Wound Therapy - Clinical Statement wound continues to have quite a bit of stringly slough and undermining. pt tolerated procedure fairly well.  will continue hydrotherapy/PLS/Santyl.  Wound Therapy - Functional Problem List bed bound  Factors Delaying/Impairing Wound Healing Incontinence;Multiple medical problems  Hydrotherapy Plan Debridement;Dressing change;Pulsatile lavage with suction  Wound Therapy - Frequency 6X / week  Wound Therapy - Current Recommendations Case manager/social work  Wound Therapy - Follow Up Recommendations Skilled nursing facility [facility])  Wound Plan Continue hydrotherapy/PLS, enzymatic debridement, and dressing changes  Wound Therapy Goals - Improve the function of patient's integumentary system by progressing the wound(s) through the phases of wound healing by:  Decrease Necrotic Tissue to 25  Decrease Necrotic Tissue - Progress Progressing toward goal  Increase Granulation Tissue to 75  Increase Granulation Tissue - Progress Progressing toward goal  Goals/treatment plan/discharge plan were made with and agreed upon by patient/family Yes  Time For Goal Achievement 2 weeks  Wound Therapy - Potential for Goals Sheria Lang, PT Acute Rehabilitation  Office: [phone] Pager: [phone]

## 2020-12-13 NOTE — Progress Notes (Signed)
PROGRESS NOTE  [name] TML:[id number] DOB: Nov 23, 1961 DOA: 12/07/2020 PCP: [name], MD   LOS: 6 days   Brief narrative:  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 Patient was then admitted to the hospital for further evaluation and treatment.  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 failure   History of simultaneous kidney and pancreas transplant ([facility])   Chronic kidney disease (CKD) stage G3a/A1, moderately decreased glomerular filtration rate (GFR) between 45-59 mL/min/1.73 square meter and albuminuria creatinine ratio less than 30 mg/g (HCC)   Hypertension   Acute kidney injury superimposed on CKD (HCC)   Type 1 diabetes mellitus with complication, with long term current use of insulin pump (HCC)   Pressure injury of skin   Long-term use of immunosuppressant medication   Sepsis ([facility])   Hypoalbuminemia   Hyponatremia   CAP (community acquired pneumonia)   Acute urinary retention   Acute metabolic encephalopathy   Sacral decubitus ulcer, stage III (HCC)   Cellulitis   Hypotension   Malodorous urine   Erosive esophagitis   Hypothyroid   Depression   Type 1 diabetes mellitus with hyperlipidemia (HCC)   Malnutrition of moderate degree  Acute metabolic encephalopathy like related to infected sacral ulcer, resolved.    Continue delirium precautions.  Continue antibiotics to complete 7-day course  Hypotension on presentation  Likely secondary to  volume depletion, infection and adrenal insufficiency on chronic prednisone.  Responded well with IV fluids.  Metoprolol and torsemide still on hold.  Off IV fluids.  Improved.  Cellulitis of unstageable sacral decubitus ulcer, present on admission.   CT of pelvis shows no evidence of osteomyelitis.  On empiric daptomycin, cefepime and Flagyl for local site infection.  Continue hydrotherapy.  Follow blood cultures- negative in 4 days. Plan for total of 7-day course of antibiotic.  Acute urinary retention with bacteriuria.  On Foley catheter.  Urine culture with 90,000 colonies of yeast, likely colonization.  Possible pneumonia cited on CXR.   chest x-ray was consistent with atelectasis versus pneumonia.   No pulmonary symptoms.  AKI on CKD stage III s/p renal transplant (1989), slowly improving.  Baseline creatinine 1.2-1.3.    Creatinine today at 1.1. continue Imuran, prednisone and cyclosporine, sodium bicarb  Macrocytic anemia, hemoglobin stable and is at baseline.  B12 and folate within normal limits.  No bleeding .  Latest hemoglobin was 7.3. Empirically treat with vitamin K81 and folic acid  Type 1 diabetes status post post pancreas transplant (1989), poorly controlled with episodes of hypoglycemia as outpatient .   Last A1c 09/2020 was 7.2.    Used to be on the insulin pump in the past.  Currently on Lantus, sliding scale insulin.  Follows up with endocrine as outpatient.   Blood glucose levels are relatively stable. Latest POC glucose of 121  Status post BKA (10/01/2020) for subacute osteomyelitis.    Mild erythema on the stump. Improved  History of erosive esophagitis, stable.   Status post EGD on 11/10/2020.  Continue PPI twice daily  Hypothyroidism, -Continue Synthroid  Hyperlipidemia  On Crestor.  Depression, stable  Continue Zoloft  Hypophosphatemia. continue with oral supplement. Check levels in a.m. latest magnesium of 1.7  Pressure ulceration. Present on  admission. Sacral decubitus ulceration unstageable, left posterior hip stage II ulceration, left posterior heel stage II ulceration. Continue wound care.  Pressure Injury 09/27/20 Sacrum Medial Unstageable - Full thickness tissue loss in which the base of the injury is covered by slough (yellow, tan, gray, green or brown) and/or eschar (tan, brown or black) in the wound bed. (Active)  09/27/20 1534  Location: Sacrum  Location Orientation: Medial  Staging: Unstageable - Full thickness tissue loss in which the base of the injury is covered by slough (yellow, tan, gray, green or brown) and/or eschar (tan, brown or black) in the wound bed.  Wound Description (Comments):   Present on Admission: Yes     Pressure Injury 12/10/20 Hip Left;Posterior;Lateral Stage 2 -  Partial thickness loss of dermis presenting as a shallow open injury with a red, pink wound bed without slough. (Active)  12/10/20 1325  Location: Hip  Location Orientation: Left;Posterior;Lateral  Staging: Stage 2 -  Partial thickness loss of dermis presenting as a shallow open injury with a red, pink wound bed without slough.  Wound Description (Comments):   Present on Admission: Yes (present on transfer to room 1402)     Pressure Injury 12/10/20 Heel Left;Posterior Stage 2 -  Partial thickness loss of dermis presenting as a shallow open injury with a red, pink wound bed without slough. (Active)  12/10/20 1325  Location: Heel  Location Orientation: Left;Posterior  Staging: Stage 2 -  Partial thickness loss of dermis presenting as a shallow open injury with a red, pink wound bed without slough.  Wound Description (Comments):   Present on Admission: Yes (upon transfer to 1402)   Moderate protein calorie malnutrition.  Present on admission.  Nutrition on board.  Continue nutritional supplements.   DVT prophylaxis: SCDs Start: 12/08/20 0053   Code Status: Full code  Family Communication: None today.  status is:  Inpatient  Remains inpatient appropriate because:IV treatments appropriate due to intensity of illness or inability to take PO and Inpatient level of care appropriate due to severity of illness, IV antibiotics, hydrotherapy  Dispo: The patient is from: SNF              Anticipated d/c is to: Likely to LTAC as per transition of care for hydrotherapy and continued care              Anticipated d/c date is: 2 days              Patient currently is medically stable to d/c for LTAC care.   Consultants:  Nephrology  Wound care  Procedures:  None  Antibiotics:  . Daptomycin 12/15> . Vancomycin,12/12>12/15 .  cefepime and metronidazole 12/12>  Anti-infectives (From admission, onward)   Start     Dose/Rate Route Frequency Ordered Stop   12/11/20 1500  ceFEPIme (MAXIPIME) 2 g in sodium chloride 0.9 % 100 mL IVPB        2 g 200 mL/hr over 30 Minutes Intravenous Every 12 hours 12/11/20 1358     12/10/20 2000  DAPTOmycin (CUBICIN) 300 mg in sodium chloride 0.9 % IVPB        300 mg 212 mL/hr over 30 Minutes Intravenous Daily 12/10/20 0946     12/08/20 2100  vancomycin (VANCOREADY) IVPB 500 mg/100 mL  Status:  Discontinued  500 mg 100 mL/hr over 60 Minutes Intravenous Every 24 hours 12/08/20 0040 12/10/20 0946   12/08/20 2000  ceFEPIme (MAXIPIME) 2 g in sodium chloride 0.9 % 100 mL IVPB  Status:  Discontinued        2 g 200 mL/hr over 30 Minutes Intravenous Every 24 hours 12/08/20 0036 12/11/20 1358   12/08/20 1400  metroNIDAZOLE (FLAGYL) tablet 500 mg        500 mg Oral Every 8 hours 12/08/20 1049     12/08/20 0600  metroNIDAZOLE (FLAGYL) IVPB 500 mg  Status:  Discontinued        500 mg 100 mL/hr over 60 Minutes Intravenous Every 8 hours 12/07/20 2248 12/08/20 1049   12/07/20 2030  ceFEPIme (MAXIPIME) 2 g in sodium chloride 0.9 % 100 mL IVPB        2 g 200 mL/hr over 30 Minutes Intravenous  Once 12/07/20 2018 12/07/20 2105   12/07/20 2030  metroNIDAZOLE (FLAGYL) IVPB 500 mg         500 mg 100 mL/hr over 60 Minutes Intravenous  Once 12/07/20 2018 12/07/20 2321   12/07/20 2030  vancomycin (VANCOCIN) IVPB 1000 mg/200 mL premix        1,000 mg 200 mL/hr over 60 Minutes Intravenous  Once 12/07/20 2018 12/07/20 2212     Subjective: Today, patient seen and examined at bedside, denies any nausea vomiting shortness of breath cough fever.  Objective: Vitals:   12/12/20 2005 12/13/20 0458  BP: 119/65 (!) 144/75  Pulse: 90 82  Resp: 16 16  Temp: 98.6 F (37 C) 97.9 F (36.6 C)  SpO2: 96% 100%    Intake/Output Summary (Last 24 hours) at 12/13/2020 0806 Last data filed at 12/13/2020 0500 Gross per 24 hour  Intake --  Output 3125 ml  Net -3125 ml   Filed Weights   12/07/20 2356 12/11/20 0424 12/13/20 0619  Weight: 52.2 kg 58 kg 60 kg   Body mass index is 23.43 kg/m.   Physical Exam:  General:  Average built, not in obvious distress, alert awake and communicative. HENT:   No scleral pallor or icterus noted. Oral mucosa is moist.  Chest:  Clear breath sounds.  Diminished breath sounds bilaterally. No crackles or wheezes.  CVS: S1 &S2 heard. No murmur.  Regular rate and rhythm. Abdomen: Soft, nontender, nondistended.  Bowel sounds are heard.   Extremities: Right below-knee amputation stump with mild erythema.  Peripheral pulses are palpable. Psych: Alert, awake and oriented, normal mood CNS:  No cranial nerve deficits.  Power equal in all extremities.   Skin:   Right below-knee amputation with stump, sacral decubitus ulceration present on admission.   Data Review: I have personally reviewed the following laboratory data and studies,  CBC: Recent Labs  Lab 12/07/20 2010 12/08/20 0407 12/09/20 0224 12/10/20 0236 12/11/20 0528 12/12/20 1056  WBC 5.6 4.9 5.0 4.9 5.0 4.3  NEUTROABS 4.2 3.3  --   --   --   --   HGB 8.0* 8.3* 7.8* 7.0* 7.2* 7.3*  HCT 25.1* 26.4* 24.4* 22.1* 23.0* 23.4*  MCV 104.6* 106.0* 105.6* 107.3* 107.0* 108.8*  PLT 246 210 204  175 155 902*   Basic Metabolic Panel: Recent Labs  Lab 12/07/20 2301 12/08/20 0407 12/09/20 0224 12/10/20 0236 12/11/20 0528 12/12/20 1056  NA  --  131* 135 131* 134* 135  K  --  4.1 4.0 4.1 3.7 4.5  CL  --  91* 96* 95* 96* 101  CO2  --  26 27 25 27 25   GLUCOSE  --  106* 131* 271* 335* 176*  BUN  --  76* 73* 76* 71* 61*  CREATININE  --  1.78* 1.79* 1.70* 1.40* 1.19*  CALCIUM  --  8.7* 8.7* 8.4* 8.5* 8.6*  MG 1.9 1.8  --   --  1.8 1.7  PHOS 3.9 3.3  --   --  1.9* 2.2*   Liver Function Tests: Recent Labs  Lab 12/07/20 2010 12/08/20 0407 12/10/20 0236 12/11/20 0528  AST 14* 13* 15 13*  ALT 9 9 8 8   ALKPHOS 82 78 73 69  BILITOT 0.8 0.8 0.7 0.7  PROT 5.4* 5.2* 4.8* 4.7*  ALBUMIN 2.0* 1.9* 1.8* 1.6*   No results for input(s): LIPASE, AMYLASE in the last 168 hours. No results for input(s): AMMONIA in the last 168 hours. Cardiac Enzymes: Recent Labs  Lab 12/07/20 2301 12/10/20 0236  CKTOTAL 11* 10*   BNP (last 3 results) No results for input(s): BNP in the last 8760 hours.  ProBNP (last 3 results) No results for input(s): PROBNP in the last 8760 hours.  CBG: Recent Labs  Lab 12/12/20 1717 12/12/20 2001 12/12/20 2345 12/13/20 0451 12/13/20 0800  GLUCAP 169* 202* 207* 140* 121*   Recent Results (from the past 240 hour(s))  Urine culture     Status: Abnormal   Collection Time: 12/07/20  8:06 PM   Specimen: Urine, Catheterized  Result Value Ref Range Status   Specimen Description   Final    URINE, CATHETERIZED Performed at [facility] [address], [place], [place] [address]    Special Requests   Final    NONE Performed at [facility], [place] [address] [place], [place] [address]    Culture 90,000 COLONIES/mL YEAST (A)  Final   Report Status 12/08/2020 FINAL  Final  Blood culture (routine single)     Status: None (Preliminary result)   Collection Time: 12/07/20  8:10 PM   Specimen: BLOOD  Result Value Ref  Range Status   Specimen Description   Final    BLOOD BLOOD LEFT FOREARM Performed at [facility] [address] [place], [place] [address]    Special Requests   Final    BOTTLES DRAWN AEROBIC AND ANAEROBIC Blood Culture results may not be optimal due to an excessive volume of blood received in culture bottles Performed at [facility] [address] [place], [place] [address]    Culture   Final    NO GROWTH 4 DAYS Performed at [facility], [place] [address], [place],  [address]    Report Status PENDING  Incomplete  Resp Panel by RT-PCR (Flu A&B, Covid) Nasopharyngeal Swab     Status: None   Collection Time: 12/07/20  8:57 PM   Specimen: Nasopharyngeal Swab; Nasopharyngeal(NP) swabs in vial transport medium  Result Value Ref Range Status   SARS Coronavirus 2 by RT PCR NEGATIVE NEGATIVE Final    Comment: (NOTE) SARS-CoV-2 target nucleic acids are NOT DETECTED.  The SARS-CoV-2 RNA is generally detectable in upper respiratory specimens during the acute phase of infection. The lowest concentration of SARS-CoV-2 viral copies this assay can detect is 138 copies/mL. A negative result does not preclude SARS-Cov-2 infection and should not be used as the sole basis for treatment or other patient management decisions. A negative result may occur with  improper specimen collection/handling, submission of specimen other than nasopharyngeal swab, presence of viral mutation(s) within  the areas targeted by this assay, and inadequate number of viral copies(<138 copies/mL). A negative result must be combined with clinical observations, patient history, and epidemiological information. The expected result is Negative.  Fact Sheet for Patients:  EntrepreneurPulse.com.au  Fact Sheet for Healthcare Providers:  IncredibleEmployment.be  This test is no t yet approved or cleared by the [place] FDA and  has  been authorized for detection and/or diagnosis of SARS-CoV-2 by FDA under an Emergency Use Authorization (EUA). This EUA will remain  in effect (meaning this test can be used) for the duration of the COVID-19 declaration under Section 564(b)(1) of the Act, 21 U.S.C.section 360bbb-3(b)(1), unless the authorization is terminated  or revoked sooner.       Influenza A by PCR NEGATIVE NEGATIVE Final   Influenza B by PCR NEGATIVE NEGATIVE Final    Comment: (NOTE) The Xpert Xpress SARS-CoV-2/FLU/RSV plus assay is intended as an aid in the diagnosis of influenza from Nasopharyngeal swab specimens and should not be used as a sole basis for treatment. Nasal washings and aspirates are unacceptable for Xpert Xpress SARS-CoV-2/FLU/RSV testing.  Fact Sheet for Patients: EntrepreneurPulse.com.au  Fact Sheet for Healthcare Providers: IncredibleEmployment.be  This test is not yet approved or cleared by the [place] FDA and has been authorized for detection and/or diagnosis of SARS-CoV-2 by FDA under an Emergency Use Authorization (EUA). This EUA will remain in effect (meaning this test can be used) for the duration of the COVID-19 declaration under Section 564(b)(1) of the Act, 21 U.S.C. section 360bbb-3(b)(1), unless the authorization is terminated or revoked.  Performed at [facility], [place] [address] [place], [place] [address]   MRSA PCR Screening     Status: Abnormal   Collection Time: 12/08/20  1:53 PM   Specimen: Nasopharyngeal  Result Value Ref Range Status   MRSA by PCR POSITIVE (A) NEGATIVE Final    Comment:        The GeneXpert MRSA Assay (FDA approved for NASAL specimens only), is one component of a comprehensive MRSA colonization surveillance program. It is not intended to diagnose MRSA infection nor to guide or monitor treatment for MRSA infections. RESULT CALLED TO, READ BACK BY AND VERIFIED WITH: ROBINSON,A.  RN @1614  12/08/20 BILLINGSLEY,L Performed at [facility], [place] [address], [place], [place] [address]      Studies: No results found.    Flora Lipps, MD  [facility] 12/13/2020  If 7PM-7AM, please contact night-coverage

## 2020-12-14 ENCOUNTER — Encounter (HOSPITAL_COMMUNITY): Payer: Self-pay | Admitting: Internal Medicine

## 2020-12-14 LAB — BASIC METABOLIC PANEL
Anion gap: 11 (ref 5–15)
BUN: 60 mg/dL — ABNORMAL HIGH (ref 8–23)
CO2: 22 mmol/L (ref 22–32)
Calcium: 8.3 mg/dL — ABNORMAL LOW (ref 8.9–10.3)
Chloride: 104 mmol/L (ref 98–111)
Creatinine, Ser: 0.98 mg/dL (ref 0.44–1.00)
GFR, Estimated: >60 mL/min (ref >60)
Glucose, Bld: 261 mg/dL — ABNORMAL HIGH (ref 70–99)
Potassium: 4.2 mmol/L (ref 3.5–5.1)
Sodium: 137 mmol/L (ref 135–145)

## 2020-12-14 LAB — PHOSPHORUS: Phosphorus: 3.1 mg/dL (ref 2.5–4.6)

## 2020-12-14 LAB — CBC
HCT: 20.2 % — ABNORMAL LOW (ref 36.0–46.0)
Hemoglobin: 6.3 g/dL — CL (ref 12.0–15.0)
MCH: 34.1 pg — ABNORMAL HIGH (ref 26.0–34.0)
MCHC: 31.2 g/dL (ref 30.0–36.0)
MCV: 109.2 fL — ABNORMAL HIGH (ref 80.0–100.0)
Platelets: 108 10*3/uL — ABNORMAL LOW (ref 150–400)
RBC: 1.85 MIL/uL — ABNORMAL LOW (ref 3.87–5.11)
RDW: 19.7 % — ABNORMAL HIGH (ref 11.5–15.5)
WBC: 3.3 10*3/uL — ABNORMAL LOW (ref 4.0–10.5)
nRBC: 0.9 % — ABNORMAL HIGH (ref 0.0–0.2)

## 2020-12-14 LAB — GLUCOSE, CAPILLARY
Glucose-Capillary: 171 mg/dL — ABNORMAL HIGH (ref 70–99)
Glucose-Capillary: 180 mg/dL — ABNORMAL HIGH (ref 70–99)
Glucose-Capillary: 183 mg/dL — ABNORMAL HIGH (ref 70–99)
Glucose-Capillary: 211 mg/dL — ABNORMAL HIGH (ref 70–99)
Glucose-Capillary: 270 mg/dL — ABNORMAL HIGH (ref 70–99)
Glucose-Capillary: 279 mg/dL — ABNORMAL HIGH (ref 70–99)

## 2020-12-14 LAB — PREPARE RBC (CROSSMATCH)

## 2020-12-14 MED ORDER — FOLIC ACID 1 MG PO TABS
1.0000 mg | ORAL_TABLET | Freq: Every day | ORAL | Status: DC
  Administered 2020-12-14 – 2021-01-08 (×26): 1 mg via ORAL
  Filled 2020-12-14 (×26): qty 1

## 2020-12-14 MED ORDER — VITAMIN B-12 100 MCG PO TABS
100.0000 ug | ORAL_TABLET | Freq: Every day | ORAL | Status: DC
  Administered 2020-12-14 – 2021-01-08 (×26): 100 ug via ORAL
  Filled 2020-12-14 (×26): qty 1

## 2020-12-14 MED ORDER — SODIUM CHLORIDE 0.9% IV SOLUTION: Freq: Once | INTRAVENOUS | Status: AC

## 2020-12-14 NOTE — Plan of Care (Signed)
  Problem: Clinical Measurements: Goal: Ability to maintain clinical measurements within normal limits will improve Outcome: Progressing   Problem: Activity: Goal: Risk for activity intolerance will decrease Outcome: Progressing   Problem: Nutrition: Goal: Adequate nutrition will be maintained Outcome: Progressing   Problem: Coping: Goal: Level of anxiety will decrease Outcome: Progressing   Problem: Education: Goal: Knowledge of General Education information will improve Description: Including pain rating scale, medication(s)/side effects and non-pharmacologic comfort measures Outcome: Completed/Met

## 2020-12-14 NOTE — Progress Notes (Addendum)
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 Patient was then admitted to the hospital for further evaluation and treatment.  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 failure   History of simultaneous kidney and pancreas transplant ([place])   Chronic kidney disease (CKD) stage G3a/A1, moderately decreased glomerular filtration rate (GFR) between 45-59 mL/min/1.73 square meter and albuminuria creatinine ratio less than 30 mg/g (HCC)   Hypertension   Acute kidney injury superimposed on CKD (HCC)   Type 1 diabetes mellitus with complication, with long term current use of insulin pump (HCC)   Pressure injury of skin   Long-term use of immunosuppressant medication   Sepsis ([place])   Hypoalbuminemia   Hyponatremia   CAP (community acquired pneumonia)   Acute urinary retention   Acute metabolic encephalopathy   Sacral decubitus ulcer, stage III (HCC)   Cellulitis   Hypotension   Malodorous urine   Erosive esophagitis   Hypothyroid   Depression   Type 1 diabetes mellitus with hyperlipidemia (HCC)   Malnutrition of moderate degree  Acute metabolic encephalopathy like related to infected sacral ulcer, resolved.    Continue delirium precautions.  Continue antibiotics to complete 7-day course  Hypotension on presentation  Likely secondary to  volume depletion, infection and adrenal insufficiency on chronic prednisone.  Improved at this time.  Continue prednisone.  Metoprolol and torsemide on hold  Cellulitis of unstageable sacral decubitus ulcer, present on admission.   CT of pelvis shows no evidence of osteomyelitis.  On empiric daptomycin, cefepime and Flagyl for local site infection.  Continue hydrotherapy.  Follow blood cultures- negative in 4 days. Plan for total of 7-day course of antibiotic.  Discontinue after today's dose  Acute urinary retention with bacteriuria.  On Foley catheter.  Urine culture with 90,000 colonies of yeast, likely colonization.  Possible pneumonia cited on CXR.   chest x-ray was consistent with atelectasis versus pneumonia.   No pulmonary symptoms.  AKI on CKD stage III s/p renal transplant (1989), slowly improving.  Baseline creatinine 1.2-1.3.    Creatinine today at 1.1. continue Imuran, prednisone and cyclosporine, sodium bicarb  Macrocytic anemia,   B12 and folate within normal limits.  No bleeding .  Latest hemoglobin was 6.3.  Will transfuse 1 unit of packed RBC today.  Continue vitamin A35 and folic acid .  Check hemoglobin in a.m. spoke about the patient regarding blood transfusion.  She has consented to it.  Type 1 diabetes status post post pancreas transplant (1989), poorly controlled with episodes of hypoglycemia as outpatient .   Last A1c 09/2020 was 7.2.    Used to be on the insulin pump in the past.  Currently on Lantus, sliding scale insulin.  Follows up with endocrine as outpatient.   Blood glucose levels are relatively stable. Latest POC glucose of 183  Status post BKA (10/01/2020) for subacute osteomyelitis.    Mild erythema on the stump. Improved  History  of erosive esophagitis, stable.   Status post EGD on 11/10/2020.  Continue PPI twice daily  Hypothyroidism, -Continue Synthroid  Hyperlipidemia On Crestor.  Depression, stable  Continue Zoloft  Hypophosphatemia.   Phosphorus level 3.1 today.  Will discontinue supplements  Pressure ulceration. Present on admission. Sacral decubitus ulceration unstageable, left posterior hip stage II ulceration, left posterior heel stage II ulceration. Continue wound care.  Pressure Injury 09/27/20 Sacrum Medial Unstageable - Full thickness tissue loss in which the base of the injury is covered by slough (yellow, tan, gray, green or brown) and/or eschar (tan, brown or black) in the wound bed. (Active)  09/27/20 1534  Location: Sacrum  Location Orientation: Medial  Staging: Unstageable - Full thickness tissue loss in which the base of the injury is covered by slough (yellow, tan, gray, green or brown) and/or eschar (tan, brown or black) in the wound bed.  Wound Description (Comments):   Present on Admission: Yes     Pressure Injury 12/10/20 Hip Left;Posterior;Lateral Stage 2 -  Partial thickness loss of dermis presenting as a shallow open injury with a red, pink wound bed without slough. (Active)  12/10/20 1325  Location: Hip  Location Orientation: Left;Posterior;Lateral  Staging: Stage 2 -  Partial thickness loss of dermis presenting as a shallow open injury with a red, pink wound bed without slough.  Wound Description (Comments):   Present on Admission: Yes (present on transfer to room 1402)     Pressure Injury 12/10/20 Heel Left;Posterior Stage 2 -  Partial thickness loss of dermis presenting as a shallow open injury with a red, pink wound bed without slough. (Active)  12/10/20 1325  Location: Heel  Location Orientation: Left;Posterior  Staging: Stage 2 -  Partial thickness loss of dermis presenting as a shallow open injury with a red, pink wound bed without slough.  Wound Description (Comments):   Present on Admission: Yes (upon transfer to 1402)   Moderate protein calorie malnutrition.  Present on admission.  Nutrition on board.  Continue nutritional supplements.   DVT prophylaxis: SCDs Start: 12/08/20  0053   Code Status: Full code  Family Communication: Unable to reach the patient's spouse Mr. [name] on the home phone and mobile phone despite multiple attempts.  status is: Inpatient  Remains inpatient appropriate because:IV treatments appropriate due to intensity of illness or inability to take PO and Inpatient level of care appropriate due to severity of illness, IV antibiotics, hydrotherapy, PRBC transfusion  Dispo: The patient is from: SNF              Anticipated d/c is to: Likely to LTAC as per transition of care for hydrotherapy and continued care              Anticipated d/c date is: 2 days              Patient currently is medically stable to d/c for LTAC care.  Consultants:  Nephrology  Wound care  Procedures:  PRBC transfusion  Hydrotherapy  Antibiotics:  . Daptomycin 12/15> . Vancomycin,12/12>12/15 .  cefepime and metronidazole 12/12>  Anti-infectives (From admission, onward)   Start     Dose/Rate Route Frequency Ordered Stop   12/11/20 1500  ceFEPIme (MAXIPIME) 2 g in sodium chloride 0.9 % 100 mL IVPB        2 g 200 mL/hr over 30 Minutes Intravenous Every 12 hours 12/11/20 1358 12/14/20 2359   12/10/20 2000  DAPTOmycin (CUBICIN) 300 mg in sodium chloride 0.9 % IVPB  300 mg 212 mL/hr over 30 Minutes Intravenous Daily 12/10/20 0946 12/14/20 2359   12/08/20 2100  vancomycin (VANCOREADY) IVPB 500 mg/100 mL  Status:  Discontinued        500 mg 100 mL/hr over 60 Minutes Intravenous Every 24 hours 12/08/20 0040 12/10/20 0946   12/08/20 2000  ceFEPIme (MAXIPIME) 2 g in sodium chloride 0.9 % 100 mL IVPB  Status:  Discontinued        2 g 200 mL/hr over 30 Minutes Intravenous Every 24 hours 12/08/20 0036 12/11/20 1358   12/08/20 1400  metroNIDAZOLE (FLAGYL) tablet 500 mg        500 mg Oral Every 8 hours 12/08/20 1049 12/14/20 2359   12/08/20 0600  metroNIDAZOLE (FLAGYL) IVPB 500 mg  Status:  Discontinued        500 mg 100 mL/hr over 60 Minutes Intravenous  Every 8 hours 12/07/20 2248 12/08/20 1049   12/07/20 2030  ceFEPIme (MAXIPIME) 2 g in sodium chloride 0.9 % 100 mL IVPB        2 g 200 mL/hr over 30 Minutes Intravenous  Once 12/07/20 2018 12/07/20 2105   12/07/20 2030  metroNIDAZOLE (FLAGYL) IVPB 500 mg        500 mg 100 mL/hr over 60 Minutes Intravenous  Once 12/07/20 2018 12/07/20 2321   12/07/20 2030  vancomycin (VANCOCIN) IVPB 1000 mg/200 mL premix        1,000 mg 200 mL/hr over 60 Minutes Intravenous  Once 12/07/20 2018 12/07/20 2212     Subjective: Today, patient was seen and examined at bedside.  Denies any nausea vomiting shortness of breath cough fever.  States that she did have some trouble breathing but no wheezing or cough.  Hemoglobin was noted to be low.    Objective: Vitals:   12/13/20 2025 12/14/20 0611  BP: 128/70 132/71  Pulse: 96 87  Resp: 16 16  Temp: 98.2 F (36.8 C) 98.1 F (36.7 C)  SpO2: 97% 98%    Intake/Output Summary (Last 24 hours) at 12/14/2020 0908 Last data filed at 12/14/2020 0500 Gross per 24 hour  Intake 833.24 ml  Output 1850 ml  Net -1016.76 ml   Filed Weights   12/07/20 2356 12/11/20 0424 12/13/20 0619  Weight: 52.2 kg 58 kg 60 kg   Body mass index is 23.43 kg/m.   Physical Exam:  General:  Average built, not in obvious distress, alert awake and communicative HENT:   No scleral pallor or icterus noted. Oral mucosa is moist.  Chest:    Diminished breath sounds bilaterally. No crackles or wheezes.  CVS: S1 &S2 heard. No murmur.  Regular rate and rhythm. Abdomen: Soft, nontender, nondistended.  Bowel sounds are heard.   Extremities: No cyanosis, clubbing or edema.  Peripheral pulses are palpable.  Right below-knee amputation stump. Psych: Alert, awake and oriented, normal mood CNS:  No cranial nerve deficits.  Power equal in all extremities.   Skin: Right below-knee amputation with stump erythema, sacral decubitus ulceration present on admission   Data Review: I have personally  reviewed the following laboratory data and studies,  CBC: Recent Labs  Lab 12/07/20 2010 12/08/20 0407 12/09/20 0224 12/10/20 0236 12/11/20 0528 12/12/20 1056 12/14/20 0545  WBC 5.6 4.9 5.0 4.9 5.0 4.3 3.3*  NEUTROABS 4.2 3.3  --   --   --   --   --   HGB 8.0* 8.3* 7.8* 7.0* 7.2* 7.3* 6.3*  HCT 25.1* 26.4* 24.4* 22.1* 23.0* 23.4* 20.2*  MCV  104.6* 106.0* 105.6* 107.3* 107.0* 108.8* 109.2*  PLT 246 210 204 175 155 127* 378*   Basic Metabolic Panel: Recent Labs  Lab 12/07/20 2301 12/08/20 0407 12/09/20 0224 12/10/20 0236 12/11/20 0528 12/12/20 1056 12/14/20 0545  NA  --  131* 135 131* 134* 135 137  K  --  4.1 4.0 4.1 3.7 4.5 4.2  CL  --  91* 96* 95* 96* 101 104  CO2  --  26 27 25 27 25 22   GLUCOSE  --  106* 131* 271* 335* 176* 261*  BUN  --  76* 73* 76* 71* 61* 60*  CREATININE  --  1.78* 1.79* 1.70* 1.40* 1.19* 0.98  CALCIUM  --  8.7* 8.7* 8.4* 8.5* 8.6* 8.3*  MG 1.9 1.8  --   --  1.8 1.7  --   PHOS 3.9 3.3  --   --  1.9* 2.2* 3.1   Liver Function Tests: Recent Labs  Lab 12/07/20 2010 12/08/20 0407 12/10/20 0236 12/11/20 0528  AST 14* 13* 15 13*  ALT 9 9 8 8   ALKPHOS 82 78 73 69  BILITOT 0.8 0.8 0.7 0.7  PROT 5.4* 5.2* 4.8* 4.7*  ALBUMIN 2.0* 1.9* 1.8* 1.6*   No results for input(s): LIPASE, AMYLASE in the last 168 hours. No results for input(s): AMMONIA in the last 168 hours. Cardiac Enzymes: Recent Labs  Lab 12/07/20 2301 12/10/20 0236  CKTOTAL 11* 10*   BNP (last 3 results) No results for input(s): BNP in the last 8760 hours.  ProBNP (last 3 results) No results for input(s): PROBNP in the last 8760 hours.  CBG: Recent Labs  Lab 12/13/20 1554 12/13/20 2022 12/14/20 0002 12/14/20 0436 12/14/20 0800  GLUCAP 153* 249* 270* 279* 183*   Recent Results (from the past 240 hour(s))  Urine culture     Status: Abnormal   Collection Time: 12/07/20  8:06 PM   Specimen: Urine, Catheterized  Result Value Ref Range Status   Specimen Description    Final    URINE, CATHETERIZED Performed at [facility] [address], [place], [place] [address]    Special Requests   Final    NONE Performed at [facility], [place] [address], [place], [place] [address]    Culture 90,000 COLONIES/mL YEAST (A)  Final   Report Status 12/08/2020 FINAL  Final  Blood culture (routine single)     Status: None   Collection Time: 12/07/20  8:10 PM   Specimen: BLOOD  Result Value Ref Range Status   Specimen Description   Final    BLOOD BLOOD LEFT FOREARM Performed at [facility] [address] [place], [place] [address]    Special Requests   Final    BOTTLES DRAWN AEROBIC AND ANAEROBIC Blood Culture results may not be optimal due to an excessive volume of blood received in culture bottles Performed at [facility] [address], [place], [place] [address]    Culture   Final    NO GROWTH 5 DAYS Performed at [facility], [place] [address], [place], [place] [address]    Report Status 12/13/2020 FINAL  Final  Resp Panel by RT-PCR (Flu A&B, Covid) Nasopharyngeal Swab     Status: None   Collection Time: 12/07/20  8:57 PM   Specimen: Nasopharyngeal Swab; Nasopharyngeal(NP) swabs in vial transport medium  Result Value Ref Range Status   SARS Coronavirus 2 by RT PCR NEGATIVE NEGATIVE Final    Comment: (  NOTE) SARS-CoV-2 target nucleic acids are NOT DETECTED.  The SARS-CoV-2 RNA is generally detectable in upper respiratory specimens during the acute phase of infection. The lowest concentration of SARS-CoV-2 viral copies this assay can detect is 138 copies/mL. A negative result does not preclude SARS-Cov-2 infection and should not be used as the sole basis for treatment or other patient management decisions. A negative result may occur with  improper specimen collection/handling, submission of specimen other than nasopharyngeal swab, presence of viral mutation(s) within  the areas targeted by this assay, and inadequate number of viral copies(<138 copies/mL). A negative result must be combined with clinical observations, patient history, and epidemiological information. The expected result is Negative.  Fact Sheet for Patients:  EntrepreneurPulse.com.au  Fact Sheet for Healthcare Providers:  IncredibleEmployment.be  This test is no t yet approved or cleared by the [place] FDA and  has been authorized for detection and/or diagnosis of SARS-CoV-2 by FDA under an Emergency Use Authorization (EUA). This EUA will remain  in effect (meaning this test can be used) for the duration of the COVID-19 declaration under Section 564(b)(1) of the Act, 21 U.S.C.section 360bbb-3(b)(1), unless the authorization is terminated  or revoked sooner.       Influenza A by PCR NEGATIVE NEGATIVE Final   Influenza B by PCR NEGATIVE NEGATIVE Final    Comment: (NOTE) The Xpert Xpress SARS-CoV-2/FLU/RSV plus assay is intended as an aid in the diagnosis of influenza from Nasopharyngeal swab specimens and should not be used as a sole basis for treatment. Nasal washings and aspirates are unacceptable for Xpert Xpress SARS-CoV-2/FLU/RSV testing.  Fact Sheet for Patients: EntrepreneurPulse.com.au  Fact Sheet for Healthcare Providers: IncredibleEmployment.be  This test is not yet approved or cleared by the [place] FDA and has been authorized for detection and/or diagnosis of SARS-CoV-2 by FDA under an Emergency Use Authorization (EUA). This EUA will remain in effect (meaning this test can be used) for the duration of the COVID-19 declaration under Section 564(b)(1) of the Act, 21 U.S.C. section 360bbb-3(b)(1), unless the authorization is terminated or revoked.  Performed at [facility], [employer] [address] [place], [place] [address]   MRSA PCR Screening     Status: Abnormal    Collection Time: 12/08/20  1:53 PM   Specimen: Nasopharyngeal  Result Value Ref Range Status   MRSA by PCR POSITIVE (A) NEGATIVE Final    Comment:        The GeneXpert MRSA Assay (FDA approved for NASAL specimens only), is one component of a comprehensive MRSA colonization surveillance program. It is not intended to diagnose MRSA infection nor to guide or monitor treatment for MRSA infections. RESULT CALLED TO, READ BACK BY AND VERIFIED WITH: ROBINSON,A. RN @1614  12/08/20 BILLINGSLEY,L Performed at [facility], [place] [address], [place], [place] [address]      Studies: No results found.    Flora Lipps, MD  [facility] 12/14/2020  If 7PM-7AM, please contact night-coverage

## 2020-12-14 NOTE — Progress Notes (Signed)
CRITICAL VALUE ALERT  Critical Value:  6.3 HMG  Date & Time Notied:  12/14/20 0636  Provider Notified: APP [place]   Orders Received/Actions taken: Provider page sent

## 2020-12-14 NOTE — Progress Notes (Signed)
Irrigated foley catheter with 50 cc NS.  Minimal amount of mucus removed from foley. Foley placed to gravity drain. Dezra Mandella, Laurel Dimmer, RN

## 2020-12-14 NOTE — Plan of Care (Signed)
  Problem: Clinical Measurements: Goal: Ability to maintain clinical measurements within normal limits will improve Outcome: Progressing Goal: Respiratory complications will improve Outcome: Progressing Goal: Cardiovascular complication will be avoided Outcome: Progressing   Problem: Coping: Goal: Level of anxiety will decrease Outcome: Progressing   

## 2020-12-15 LAB — TYPE AND SCREEN
ABO/RH(D): O POS
Antibody Screen: NEGATIVE
Unit division: 0

## 2020-12-15 LAB — CBC
HCT: 25.4 % — ABNORMAL LOW (ref 36.0–46.0)
Hemoglobin: 8.1 g/dL — ABNORMAL LOW (ref 12.0–15.0)
MCH: 33.2 pg (ref 26.0–34.0)
MCHC: 31.9 g/dL (ref 30.0–36.0)
MCV: 104.1 fL — ABNORMAL HIGH (ref 80.0–100.0)
Platelets: 102 10*3/uL — ABNORMAL LOW (ref 150–400)
RBC: 2.44 MIL/uL — ABNORMAL LOW (ref 3.87–5.11)
RDW: 21.5 % — ABNORMAL HIGH (ref 11.5–15.5)
WBC: 3.6 10*3/uL — ABNORMAL LOW (ref 4.0–10.5)
nRBC: 1.4 % — ABNORMAL HIGH (ref 0.0–0.2)

## 2020-12-15 LAB — GLUCOSE, CAPILLARY
Glucose-Capillary: 109 mg/dL — ABNORMAL HIGH (ref 70–99)
Glucose-Capillary: 121 mg/dL — ABNORMAL HIGH (ref 70–99)
Glucose-Capillary: 145 mg/dL — ABNORMAL HIGH (ref 70–99)
Glucose-Capillary: 179 mg/dL — ABNORMAL HIGH (ref 70–99)
Glucose-Capillary: 241 mg/dL — ABNORMAL HIGH (ref 70–99)
Glucose-Capillary: 257 mg/dL — ABNORMAL HIGH (ref 70–99)

## 2020-12-15 LAB — BPAM RBC
Blood Product Expiration Date: 202201202359
ISSUE DATE / TIME: 202112191219
Unit Type and Rh: 5100

## 2020-12-15 MED ORDER — METOPROLOL TARTRATE 25 MG PO TABS
12.5000 mg | ORAL_TABLET | Freq: Two times a day (BID) | ORAL | Status: DC
  Administered 2020-12-15 – 2021-01-08 (×49): 12.5 mg via ORAL
  Filled 2020-12-15 (×49): qty 1

## 2020-12-15 NOTE — Plan of Care (Signed)
  Problem: Activity: Goal: Risk for activity intolerance will decrease 12/15/2020 1345 by Margarette Canada, RN Outcome: Progressing 12/15/2020 1345 by Margarette Canada, RN Outcome: Progressing   Problem: Nutrition: Goal: Adequate nutrition will be maintained Outcome: Progressing   Problem: Coping: Goal: Level of anxiety will decrease Outcome: Progressing   Problem: Elimination: Goal: Will not experience complications related to urinary retention Outcome: Progressing   Problem: Clinical Measurements: Goal: Respiratory complications will improve Outcome: Completed/Met   Problem: Pain Managment: Goal: General experience of comfort will improve Outcome: Completed/Met

## 2020-12-15 NOTE — Consult Note (Addendum)
WOC Nurse:  Consult requested to assess need for continued hydrotherapy. Performed remotely after review of progress notes, wound care flow sheet, and discussion via phone call with the PT department, who just completed hydrotherapy today.  Initial WOC consult was performed on 12/13 and physical therapy has been following since that time for hydrotherapy to assist with removal of nonviable tissue.  They indicate that the Unstageable sacrum wound has greatly improved since admission, and is now 65% pink, 35% yellow slough, most of which is tightly adhered to the bony areas. Hydrotherapy is not as effective in these situations and their team feels that continued hydrotherapy is not necessary after discharge.   Topical treatment orders provided for bedside nurses to perform daily to provide enzymatic debridement of nonviable tissue as follows: Cleanse wounds to sacrum and along BKA stump site with NS.  Apply Santyl to wound beds Q day Cover with NS moist gauze. Secure with dry gauze and ABD pad.  Wrap stump with kerlix/tape.  Please re-consult if further assistance is needed.  Thank-you,  Julien Girt MSN, [place], [place], [place], [place]

## 2020-12-15 NOTE — Progress Notes (Signed)
PT HYDROTHERAPY TREATMENT  *Wound continues to clean up well. Pt could potentially d/c to a SNF/LTACH without PT hydrotherapy but she will absolutely need 2x/day dressing changes and continued use of Santyl.*    12/15/20 1200  Subjective Assessment  Subjective I want this to get better  Patient and Family Stated Goals agreed to wound care  Date of Onset  (present on admission)  Prior Treatments PLS/hydrotherapy with PT last admission  Evaluation and Treatment  Evaluation and Treatment Procedures Explained to Patient/Family Yes  Evaluation and Treatment Procedures agreed to  Wound / Incision (Open or Dehisced) 12/09/20 Other (Comment) Sacrum Medial open nonstageable sacral wound, malodorous with slough.  Date First Assessed: 12/09/20   Wound Type: Other (Comment)  Location: Sacrum  Location Orientation: Medial  Wound Description (Comments): open nonstageable sacral wound, malodorous with slough.  Dressing Type Foam - Lift dressing to assess site every shift;Barrier Film (skin prep);Moist to moist;Gauze (Comment)  Dressing Changed Changed  Dressing Status Old drainage  Dressing Change Frequency Daily  Site / Wound Assessment Bleeding;Black;Brown;Pale;Pink;Red;Yellow  % Wound base Red or Granulating 65%  % Wound base Yellow/Fibrinous Exudate 30%  % Wound base Black/Eschar 5%  Peri-wound Assessment Maceration;Pink  Wound Length (cm) 3.5 cm  Wound Width (cm) 3 cm  Wound Depth (cm) 1.5 cm  Wound Volume (cm^3) 15.75 cm^3  Wound Surface Area (cm^2) 10.5 cm^2  Tunneling (cm)  (12:00)  Undermining (cm)  (2-3cm from 10:00-5:00)  Drainage Amount Moderate  Drainage Description Serosanguineous  Treatment Debridement (Selective);Hydrotherapy (Pulse lavage);Packing (Saline gauze) Enzymatic debridement (Santyl)  Hydrotherapy  Pulsed Lavage with Suction (psi) 8 psi  Pulsed Lavage with Suction - Normal Saline Used 1000 mL  Pulsed Lavage Tip Tip with splash shield  Pulsed lavage therapy -  wound location sacrum  Selective Debridement  Selective Debridement - Location sacrum  Selective Debridement - Tools Used Forceps;Scissors  Selective Debridement - Tissue Removed slough and eschar  Wound Therapy - Assess/Plan/Recommendations  Wound Therapy - Clinical Statement wound continues to have stringy slough and very small amount of black eschar. pt tolerated procedure well. will continue hydrotherapy/PLS/Santyl.  Wound Therapy - Functional Problem List bed bound  Factors Delaying/Impairing Wound Healing Incontinence;Multiple medical problems  Hydrotherapy Plan Debridement;Dressing change;Pulsatile lavage with suction  Wound Therapy - Frequency 6X / week  Wound Therapy - Current Recommendations Case manager/social work  Wound Therapy - Follow Up Recommendations Skilled nursing facility [facility])  Wound Plan Continue hydrotherapy/PLS, enzymatic debridement, and dressing changes  Wound Therapy Goals - Improve the function of patient's integumentary system by progressing the wound(s) through the phases of wound healing by:  Decrease Necrotic Tissue to 25  Decrease Necrotic Tissue - Progress Progressing toward goal  Increase Granulation Tissue to 75  Increase Granulation Tissue - Progress Progressing toward goal  Improve Drainage Characteristics Min  Improve Drainage Characteristics - Progress Goal set today  Goals/treatment plan/discharge plan were made with and agreed upon by patient/family Yes  Time For Goal Achievement 2 weeks  Wound Therapy - Potential for Goals Sheria Lang, PT Acute Rehabilitation  Office: [phone] Pager: [phone]

## 2020-12-15 NOTE — Progress Notes (Signed)
Palliative care brief progress note  I discussed with Dr. Louanne Lopez today.  [name] is clear in desire for continuation of aggressive interventions and currently working on authorization for LTAC.  As goals are clear, palliative care will not continue to follow at this time.  Please feel free to call or reconsult if we can be of further assistance in the care of [name] this admission.  Micheline Rough, MD [place] Palliative Medicine Team [phone]  NO CHARGE NOTE

## 2020-12-15 NOTE — Progress Notes (Addendum)
PROGRESS NOTE  [name] FVC:[id number] DOB: 02-17-1961 DOA: 12/07/2020 PCP: [name], MD   LOS: 8 days   Brief narrative:  Ms. 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 Patient was then admitted to the hospital for further evaluation and treatment.  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 failure   History of simultaneous kidney and pancreas transplant ([place])   Chronic kidney disease (CKD) stage G3a/A1, moderately decreased glomerular filtration rate (GFR) between 45-59 mL/min/1.73 square meter and albuminuria creatinine ratio less than 30 mg/g (HCC)   Hypertension   Acute kidney injury superimposed on CKD (HCC)   Type 1 diabetes mellitus with complication, with long term current use of insulin pump (HCC)   Pressure injury of skin   Long-term use of immunosuppressant medication   Sepsis ([place])   Hypoalbuminemia   Hyponatremia   CAP (community acquired pneumonia)   Acute urinary retention   Acute metabolic encephalopathy   Sacral decubitus ulcer, stage III (HCC)   Cellulitis   Hypotension   Malodorous urine   Erosive esophagitis   Hypothyroid   Depression   Type 1 diabetes mellitus with hyperlipidemia (HCC)   Malnutrition of moderate degree  Acute metabolic encephalopathy like related to infected sacral ulcer, resolved.    Continue delirium precautions.  Completed 7 day course of antibiotics.  Continue hydrotherapy.  Hypotension on presentation  Likely  secondary to volume depletion, infection and adrenal insufficiency on chronic prednisone.  Improved at this time.  Continue prednisone.  Metoprolol and torsemide on hold, will restart metoprolol.  Cellulitis of unstageable sacral decubitus ulcer, present on admission.   CT of pelvis shows no evidence of osteomyelitis.  Patient received daptomycin, cefepime and Flagyl for local site infection.  Continue hydrotherapy.  Wound care following and recommended hydrotherapy but other modalities would be acceptable if possible.  Negative blood cultures so far.  Completed total of 7-day course of antibiotic.  Acute urinary retention with bacteriuria.  On Foley catheter.  Urine culture with 90,000 colonies of yeast, likely colonization.  Discontinue Foley catheter today.  Possible pneumonia cited on CXR.   chest x-ray was consistent with atelectasis versus pneumonia.   No pulmonary symptoms.  AKI on CKD stage III s/p renal transplant (1989), slowly improving.  Baseline creatinine 1.2-1.3.    Creatinine today at 1.1. continue Imuran, prednisone and cyclosporine, sodium bicarb.  Discontinue Foley catheter today.  Macrocytic anemia,   B12 and folate within normal limits.  No bleeding .  Status post packed RBC transfusion on 12/14/2020.  Hemoglobin today at 8.1 .  Type 1 diabetes status post post pancreas transplant (1989), poorly controlled with episodes of hypoglycemia as outpatient .   Last A1c 09/2020 was 7.2.    Used to be on the insulin pump in the past.  Currently on Lantus, sliding scale insulin.  Follows up with endocrine as outpatient.    Latest POC glucose of 145  Status post BKA (10/01/2020) for subacute osteomyelitis.    Mild erythema on the stump. Improved  History of erosive esophagitis, stable.   Status  post EGD on 11/10/2020.  Continue PPI twice daily  Hypothyroidism, -Continue Synthroid  Hyperlipidemia On Crestor.  Depression, stable  Continue Zoloft  Hypophosphatemia.   Improved with replacement.  Pressure ulceration. Present on admission. Sacral decubitus ulceration unstageable, left posterior hip stage II ulceration, left posterior heel stage II ulceration. Continue wound care, hydrotherapy.  Pressure Injury 09/27/20 Sacrum Medial Unstageable - Full thickness tissue loss in which the base of the injury is covered by slough (yellow, tan, gray, green or brown) and/or eschar (tan, brown or black) in the wound bed. (Active)  09/27/20 1534  Location: Sacrum  Location Orientation: Medial  Staging: Unstageable - Full thickness tissue loss in which the base of the injury is covered by slough (yellow, tan, gray, green or brown) and/or eschar (tan, brown or black) in the wound bed.  Wound Description (Comments):   Present on Admission: Yes     Pressure Injury 12/10/20 Hip Left;Posterior;Lateral Stage 2 -  Partial thickness loss of dermis presenting as a shallow open injury with a red, pink wound bed without slough. (Active)  12/10/20 1325  Location: Hip  Location Orientation: Left;Posterior;Lateral  Staging: Stage 2 -  Partial thickness loss of dermis presenting as a shallow open injury with a red, pink wound bed without slough.  Wound Description (Comments):   Present on Admission: Yes (present on transfer to room 1402)     Pressure Injury 12/10/20 Heel Left;Posterior Stage 2 -  Partial thickness loss of dermis presenting as a shallow open injury with a red, pink wound bed without slough. (Active)  12/10/20 1325  Location: Heel  Location Orientation: Left;Posterior  Staging: Stage 2 -  Partial thickness loss of dermis presenting as a shallow open injury with a red, pink wound bed without slough.  Wound Description (Comments):   Present on Admission: Yes (upon transfer to 1402)   Moderate protein calorie malnutrition.  Present on admission.  Nutrition on board.  Continue nutritional supplements.   DVT prophylaxis: SCDs Start: 12/08/20 0053   Code Status:  Full code  Family Communication: None today.  status is: Inpatient  Remains inpatient appropriate because:IV treatments appropriate due to intensity of illness or inability to take PO and Inpatient level of care appropriate due to severity of illness,  Dispo: The patient is from: SNF              Anticipated d/c is to: Likely to LTAC as per transition of care for hydrotherapy and continued care    .  LTAC has been consulted.  Spoke at the progression rounds.          Anticipated d/c date is: 2 days              Patient currently is medically stable to d/c for LTAC care.  Consultants:  Nephrology  Wound care  Procedures:  PRBC transfusion  Hydrotherapy  Antibiotics:  . Daptomycin 12/15>12/19 . Vancomycin,12/12>12/15 .  cefepime and metronidazole 12/12>12/19  Subjective: Today, patient was seen and examined at bedside.  Denies any nausea vomiting fever chills or rigor.  Received blood transfusion yesterday.  Objective: Vitals:   12/14/20 2011 12/15/20 0434  BP: (!) 150/78 (!) 149/87  Pulse: 79 83  Resp: 18 16  Temp: 98.9 F (37.2 C) 98.8 F (37.1 C)  SpO2: 98% 98%    Intake/Output Summary (Last 24 hours) at 12/15/2020 0708 Last data filed at 12/15/2020 0300 Gross per 24 hour  Intake 847.5 ml  Output 3550 ml  Net -2702.5 ml  Filed Weights   12/07/20 2356 12/11/20 0424 12/13/20 0619  Weight: 52.2 kg 58 kg 60 kg   Body mass index is 23.43 kg/m.   Physical Exam:  General:  Average built, not in obvious distress, alert awake and communicative HENT:   Mild pallor noted oral mucosa is moist.  Left eye with mild subconjunctival hemorrhage Chest:    Diminished breath sounds bilaterally. No crackles or wheezes.  CVS: S1 &S2 heard. No murmur.  Regular rate and rhythm. Abdomen: Soft, nontender, nondistended.  Bowel sounds are heard.  Foley catheter in place. Extremities: No cyanosis, clubbing or edema.  Peripheral pulses are palpable.  Right below-knee amputation  stump. Psych: Alert, awake and communicative, CNS:  No cranial nerve deficits.  Power equal in all extremities.   Skin: Right below-knee amputation with stump erythema, sacral decubitus ulceration present on admission   Data Review: I have personally reviewed the following laboratory data and studies,  CBC: Recent Labs  Lab 12/10/20 0236 12/11/20 0528 12/12/20 1056 12/14/20 0545 12/15/20 0502  WBC 4.9 5.0 4.3 3.3* 3.6*  HGB 7.0* 7.2* 7.3* 6.3* 8.1*  HCT 22.1* 23.0* 23.4* 20.2* 25.4*  MCV 107.3* 107.0* 108.8* 109.2* 104.1*  PLT 175 155 127* 108* 932*   Basic Metabolic Panel: Recent Labs  Lab 12/09/20 0224 12/10/20 0236 12/11/20 0528 12/12/20 1056 12/14/20 0545  NA 135 131* 134* 135 137  K 4.0 4.1 3.7 4.5 4.2  CL 96* 95* 96* 101 104  CO2 27 25 27 25 22   GLUCOSE 131* 271* 335* 176* 261*  BUN 73* 76* 71* 61* 60*  CREATININE 1.79* 1.70* 1.40* 1.19* 0.98  CALCIUM 8.7* 8.4* 8.5* 8.6* 8.3*  MG  --   --  1.8 1.7  --   PHOS  --   --  1.9* 2.2* 3.1   Liver Function Tests: Recent Labs  Lab 12/10/20 0236 12/11/20 0528  AST 15 13*  ALT 8 8  ALKPHOS 73 69  BILITOT 0.7 0.7  PROT 4.8* 4.7*  ALBUMIN 1.8* 1.6*   No results for input(s): LIPASE, AMYLASE in the last 168 hours. No results for input(s): AMMONIA in the last 168 hours. Cardiac Enzymes: Recent Labs  Lab 12/10/20 0236  CKTOTAL 10*   BNP (last 3 results) No results for input(s): BNP in the last 8760 hours.  ProBNP (last 3 results) No results for input(s): PROBNP in the last 8760 hours.  CBG: Recent Labs  Lab 12/14/20 1128 12/14/20 1629 12/14/20 2008 12/14/20 2356 12/15/20 0430  GLUCAP 171* 180* 211* 179* 145*   Recent Results (from the past 240 hour(s))  Urine culture     Status: Abnormal   Collection Time: 12/07/20  8:06 PM   Specimen: Urine, Catheterized  Result Value Ref Range Status   Specimen Description   Final    URINE, CATHETERIZED Performed at [facility]  [address] [place], [place] [address]    Special Requests   Final    NONE Performed at [facility], [place] [address], [place], [place] [address]    Culture 90,000 COLONIES/mL YEAST (A)  Final   Report Status 12/08/2020 FINAL  Final  Blood culture (routine single)     Status: None   Collection Time: 12/07/20  8:10 PM   Specimen: BLOOD  Result Value Ref Range Status   Specimen Description   Final    BLOOD BLOOD LEFT FOREARM Performed at [facility] [address] [place], [place] [address]  Special Requests   Final    BOTTLES DRAWN AEROBIC AND ANAEROBIC Blood Culture results may not be optimal due to an excessive volume of blood received in culture bottles Performed at [facility] [address] [place], [place] [address]    Culture   Final    NO GROWTH 5 DAYS Performed at [facility], [place] [address], [place], [place] [address]    Report Status 12/13/2020 FINAL  Final  Resp Panel by RT-PCR (Flu A&B, Covid) Nasopharyngeal Swab     Status: None   Collection Time: 12/07/20  8:57 PM   Specimen: Nasopharyngeal Swab; Nasopharyngeal(NP) swabs in vial transport medium  Result Value Ref Range Status   SARS Coronavirus 2 by RT PCR NEGATIVE NEGATIVE Final    Comment: (NOTE) SARS-CoV-2 target nucleic acids are NOT DETECTED.  The SARS-CoV-2 RNA is generally detectable in upper respiratory specimens during the acute phase of infection. The lowest concentration of SARS-CoV-2 viral copies this assay can detect is 138 copies/mL. A negative result does not preclude SARS-Cov-2 infection and should not be used as the sole basis for treatment or other patient management decisions. A negative result may occur with  improper specimen collection/handling, submission of specimen other than nasopharyngeal swab, presence of viral mutation(s) within the areas targeted by this assay, and inadequate number of viral copies(<138 copies/mL).  A negative result must be combined with clinical observations, patient history, and epidemiological information. The expected result is Negative.  Fact Sheet for Patients:  EntrepreneurPulse.com.au  Fact Sheet for Healthcare Providers:  IncredibleEmployment.be  This test is no t yet approved or cleared by the [place] FDA and  has been authorized for detection and/or diagnosis of SARS-CoV-2 by FDA under an Emergency Use Authorization (EUA). This EUA will remain  in effect (meaning this test can be used) for the duration of the COVID-19 declaration under Section 564(b)(1) of the Act, 21 U.S.C.section 360bbb-3(b)(1), unless the authorization is terminated  or revoked sooner.       Influenza A by PCR NEGATIVE NEGATIVE Final   Influenza B by PCR NEGATIVE NEGATIVE Final    Comment: (NOTE) The Xpert Xpress SARS-CoV-2/FLU/RSV plus assay is intended as an aid in the diagnosis of influenza from Nasopharyngeal swab specimens and should not be used as a sole basis for treatment. Nasal washings and aspirates are unacceptable for Xpert Xpress SARS-CoV-2/FLU/RSV testing.  Fact Sheet for Patients: EntrepreneurPulse.com.au  Fact Sheet for Healthcare Providers: IncredibleEmployment.be  This test is not yet approved or cleared by the [place] FDA and has been authorized for detection and/or diagnosis of SARS-CoV-2 by FDA under an Emergency Use Authorization (EUA). This EUA will remain in effect (meaning this test can be used) for the duration of the COVID-19 declaration under Section 564(b)(1) of the Act, 21 U.S.C. section 360bbb-3(b)(1), unless the authorization is terminated or revoked.  Performed at [facility], [place] [address], [place], [place] [address]   MRSA PCR Screening     Status: Abnormal   Collection Time: 12/08/20  1:53 PM   Specimen: Nasopharyngeal  Result Value Ref Range  Status   MRSA by PCR POSITIVE (A) NEGATIVE Final    Comment:        The GeneXpert MRSA Assay (FDA approved for NASAL specimens only), is one component of a comprehensive MRSA colonization surveillance program. It is not intended to diagnose MRSA infection nor to guide or monitor treatment for MRSA infections. RESULT CALLED TO, READ BACK BY AND VERIFIED  WITH: Pablo Ledger RN @1614  12/08/20 BILL[place],L Performed at [facility], [place] [address] [place], [place] [address]      Studies: No results found.    Flora Lipps, MD  [facility] 12/15/2020  If 7PM-7AM, please contact night-coverage

## 2020-12-15 NOTE — TOC Progression Note (Signed)
Transition of Care [facility]) - Progression Note    Patient Details  Name: [name] MRN: [id number] Date of Birth: 17-Mar-1961  [facility]) CM/SW Contact  Joaquin Courts, RN Phone Number: 12/15/2020, 3:30 PM  Clinical Narrative:    CM spoke with Raquel Sarna at [place], insurance Josem Kaufmann is still pending, additional clinical information has been submitted.    Expected Discharge Plan: Long Term Acute Care (LTAC) Barriers to Discharge: Insurance Authorization  Expected Discharge Plan and Services Expected Discharge Plan: Long Term Acute Care (LTAC)   Discharge Planning Services: CM Consult Post Acute Care Choice: [place] Living arrangements for the past 2 months: Single Family Home                                       Social Determinants of Health (SDOH) Interventions    Readmission Risk Interventions No flowsheet data found.

## 2020-12-16 LAB — HEMOGLOBIN AND HEMATOCRIT, BLOOD
HCT: 28 % — ABNORMAL LOW (ref 36.0–46.0)
Hemoglobin: 8.6 g/dL — ABNORMAL LOW (ref 12.0–15.0)

## 2020-12-16 LAB — GLUCOSE, CAPILLARY
Glucose-Capillary: 126 mg/dL — ABNORMAL HIGH (ref 70–99)
Glucose-Capillary: 146 mg/dL — ABNORMAL HIGH (ref 70–99)
Glucose-Capillary: 169 mg/dL — ABNORMAL HIGH (ref 70–99)
Glucose-Capillary: 195 mg/dL — ABNORMAL HIGH (ref 70–99)
Glucose-Capillary: 205 mg/dL — ABNORMAL HIGH (ref 70–99)
Glucose-Capillary: 228 mg/dL — ABNORMAL HIGH (ref 70–99)

## 2020-12-16 NOTE — TOC Progression Note (Signed)
Transition of Care [facility]) - Progression Note    Patient Details  Name: [name] MRN: [id number] Date of Birth: May 24, 1961  [facility]) CM/SW Contact  Joaquin Courts, RN Phone Number: 12/16/2020, 11:50 AM  Clinical Narrative:    C received call from [place], stating insurance has denied the [place] for LTAC.  Request submitted for an expedited appeal process.  CM spoke with patient and updated her on auth denial, patient expresses a lot of concern about this.  CM discussed that without an insurance auth, the next option would be to search for SNF, however patient reports she has [facility] after trying SNF for wound care as her would gets worse at facility.  CM will continue to follow for expedited appeal results.    Expected Discharge Plan: Long Term Acute Care (LTAC) Barriers to Discharge: Insurance Authorization  Expected Discharge Plan and Services Expected Discharge Plan: Long Term Acute Care (LTAC)   Discharge Planning Services: CM Consult Post Acute Care Choice: [place] Living arrangements for the past 2 months: Single Family Home                                       Social Determinants of Health (SDOH) Interventions    Readmission Risk Interventions No flowsheet data found.

## 2020-12-16 NOTE — Progress Notes (Signed)
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 Patient was then admitted to the hospital for further evaluation and treatment.  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 failure   History of simultaneous kidney and pancreas transplant ([name])   Chronic kidney disease (CKD) stage G3a/A1, moderately decreased glomerular filtration rate (GFR) between 45-59 mL/min/1.73 square meter and albuminuria creatinine ratio less than 30 mg/g (HCC)   Hypertension   Acute kidney injury superimposed on CKD (HCC)   Type 1 diabetes mellitus with complication, with long term current use of insulin pump (HCC)   Pressure injury of skin   Long-term use of immunosuppressant medication   Sepsis ([place])   Hypoalbuminemia   Hyponatremia   CAP (community acquired pneumonia)   Acute urinary retention   Acute metabolic encephalopathy   Sacral decubitus ulcer, stage III (HCC)   Cellulitis   Hypotension   Malodorous urine   Erosive esophagitis   Hypothyroid   Depression   Type 1 diabetes mellitus with hyperlipidemia (HCC)   Malnutrition of moderate degree  Acute metabolic encephalopathy like related to infected sacral ulcer, resolved.    Continue delirium precautions.  Completed 7 day course of antibiotics.  Hypotension on presentation  Likely secondary to volume  depletion, infection and adrenal insufficiency on chronic prednisone.  Improved.  Has been started on metoprolol.  Torsemide still on hold.    Cellulitis of unstageable sacral decubitus ulcer, present on admission.   CT of pelvis showed no evidence of osteomyelitis.  Patient initially received daptomycin, cefepime and Flagyl for local site infection including hydrotherapy.  Wound care following and now recommend local wound care.  Blood cultures negative.  Completed total of 7-day course of antibiotic.  Acute urinary retention with bacteriuria.  Foley catheter has been discontinued yesterday.  Urine culture with 90,000 colonies of yeast, likely colonization.   Possible pneumonia cited on CXR.   chest x-ray was consistent with atelectasis versus pneumonia.   No pulmonary symptoms.  Had received 1 week of antibiotic.  AKI on CKD stage III s/p renal transplant (1989), slowly improving.  Baseline creatinine 1.2-1.3.    Creatinine today at 1.1. continue Imuran, prednisone and cyclosporine, sodium bicarb.  Discontinue Foley catheter today.  Macrocytic anemia,   B12 and folate within normal limits.  No bleeding  Status post packed RBC transfusion on 12/14/2020.  Hemoglobin today at 8.6  Type 1 diabetes status post post pancreas transplant (1989), poorly controlled with episodes of hypoglycemia as outpatient .   Last A1c 09/2020 was 7.2.    Used to be on the insulin pump in the past.  Currently on Lantus, sliding scale insulin.  Follows up with endocrine as outpatient.    Latest POC glucose of 146  Status post BKA (10/01/2020) for subacute osteomyelitis.    Mild erythema on the stump. Improved  History of erosive esophagitis, stable.   Status post EGD on 11/10/2020.  Continue PPI twice  daily  Hypothyroidism, -Continue Synthroid  Hyperlipidemia On Crestor.  Depression, stable  Continue Zoloft  Hypophosphatemia.  Improved with replacement. Latest phosphate of 3.1  Pressure ulceration.  Present on admission. Sacral decubitus ulceration unstageable, left posterior hip stage II ulceration, left posterior heel stage II ulceration. Continue wound care.  It appears that patient would need continued wound care which would be best served at a long-term acute care facility.  Pressure Injury 09/27/20 Sacrum Medial Unstageable - Full thickness tissue loss in which the base of the injury is covered by slough (yellow, tan, gray, green or brown) and/or eschar (tan, brown or black) in the wound bed. (Active)  09/27/20 1534  Location: Sacrum  Location Orientation: Medial  Staging: Unstageable - Full thickness tissue loss in which the base of the injury is covered by slough (yellow, tan, gray, green or brown) and/or eschar (tan, brown or black) in the wound bed.  Wound Description (Comments):   Present on Admission: Yes     Pressure Injury 12/10/20 Hip Left;Posterior;Lateral Stage 2 -  Partial thickness loss of dermis presenting as a shallow open injury with a red, pink wound bed without slough. (Active)  12/10/20 1325  Location: Hip  Location Orientation: Left;Posterior;Lateral  Staging: Stage 2 -  Partial thickness loss of dermis presenting as a shallow open injury with a red, pink wound bed without slough.  Wound Description (Comments):   Present on Admission: Yes (present on transfer to room 1402)     Pressure Injury 12/10/20 Heel Left;Posterior Stage 2 -  Partial thickness loss of dermis presenting as a shallow open injury with a red, pink wound bed without slough. (Active)  12/10/20 1325  Location: Heel  Location Orientation: Left;Posterior  Staging: Stage 2 -  Partial thickness loss of dermis presenting as a shallow open injury with a red, pink wound bed without slough.  Wound Description (Comments):   Present on Admission: Yes (upon transfer to 1402)   Moderate protein calorie malnutrition.  Present on admission.  Nutrition on board.  Continue nutritional supplements.  DVT  prophylaxis: SCDs Start: 12/08/20 0053  Code Status: Full code  Family Communication: Unable to reach the patient's on the home phone and mobile phone listed despite multiple attempts.  Spoke with the patient at bedside.  status is: Inpatient  Remains inpatient appropriate because:IV treatments appropriate due to intensity of illness, needing placement.  Dispo: The patient is from: SNF              Anticipated d/c is to: Likely to long-term acute care for ongoing/complex wound care.               anticipated d/c date is: When bed available.              Patient currently is medically stable to d/c for LTAC care.  Consultants:  Nephrology  Wound care  Procedures:  PRBC transfusion  Hydrotherapy  Antibiotics:  . Daptomycin 12/15>12/19 . Vancomycin,12/12>12/15 .  cefepime and metronidazole 12/12>12/19  Subjective: Today, patient was seen and examined at bedside.  Denies any nausea, vomiting, fever, chills or rigor.  Objective: Vitals:   12/16/20 0501 12/16/20 0511  BP: (!) 146/73 128/61  Pulse: 77 73  Resp:  18  Temp: 98.1 F (36.7 C) 99.1 F (37.3 C)  SpO2: 97% 92%    Intake/Output Summary (Last 24 hours) at 12/16/2020 0731 Last data filed at 12/16/2020 0600 Gross per 24 hour  Intake 600 ml  Output 3201 ml  Net -2601  ml   Filed Weights   12/07/20 2356 12/11/20 0424 12/13/20 0619  Weight: 52.2 kg 58 kg 60 kg   Body mass index is 23.43 kg/m.   Physical Exam:  General:  Average built, not in obvious distress, alert awake and communicative HENT: Left eye with mild subconjunctival hemorrhage, mild pallor noted. Chest:  Diminished breath sounds bilaterally. No crackles or wheezes.  CVS: S1 &S2 heard. No murmur.  Regular rate and rhythm. Abdomen: Soft, nontender, nondistended.  Bowel sounds are heard.  External Foley catheter in place. Extremities: No cyanosis, clubbing.  Peripheral pulses are palpable.  Right below-knee amputation with stump Psych: Alert,  awake and communicative CNS:  No cranial nerve deficits.  Power equal in all extremities.   Skin: Right below-knee amputation,sacral decubitus ulceration patient unstageable.  Data Review: I have personally reviewed the following laboratory data and studies,  CBC: Recent Labs  Lab 12/10/20 0236 12/11/20 0528 12/12/20 1056 12/14/20 0545 12/15/20 0502 12/16/20 0510  WBC 4.9 5.0 4.3 3.3* 3.6*  --   HGB 7.0* 7.2* 7.3* 6.3* 8.1* 8.6*  HCT 22.1* 23.0* 23.4* 20.2* 25.4* 28.0*  MCV 107.3* 107.0* 108.8* 109.2* 104.1*  --   PLT 175 155 127* 108* 102*  --    Basic Metabolic Panel: Recent Labs  Lab 12/10/20 0236 12/11/20 0528 12/12/20 1056 12/14/20 0545  NA 131* 134* 135 137  K 4.1 3.7 4.5 4.2  CL 95* 96* 101 104  CO2 25 27 25 22   GLUCOSE 271* 335* 176* 261*  BUN 76* 71* 61* 60*  CREATININE 1.70* 1.40* 1.19* 0.98  CALCIUM 8.4* 8.5* 8.6* 8.3*  MG  --  1.8 1.7  --   PHOS  --  1.9* 2.2* 3.1   Liver Function Tests: Recent Labs  Lab 12/10/20 0236 12/11/20 0528  AST 15 13*  ALT 8 8  ALKPHOS 73 69  BILITOT 0.7 0.7  PROT 4.8* 4.7*  ALBUMIN 1.8* 1.6*   No results for input(s): LIPASE, AMYLASE in the last 168 hours. No results for input(s): AMMONIA in the last 168 hours. Cardiac Enzymes: Recent Labs  Lab 12/10/20 0236  CKTOTAL 10*   BNP (last 3 results) No results for input(s): BNP in the last 8760 hours.  ProBNP (last 3 results) No results for input(s): PROBNP in the last 8760 hours.  CBG: Recent Labs  Lab 12/15/20 1558 12/15/20 2003 12/16/20 0008 12/16/20 0409 12/16/20 0713  GLUCAP 257* 241* 228* 195* 146*   Recent Results (from the past 240 hour(s))  Urine culture     Status: Abnormal   Collection Time: 12/07/20  8:06 PM   Specimen: Urine, Catheterized  Result Value Ref Range Status   Specimen Description   Final    URINE, CATHETERIZED Performed at [facility] [address], [place], [place] [address]    Special Requests   Final     NONE Performed at [facility], [place] [address], [place], [place] [address]    Culture 90,000 COLONIES/mL YEAST (A)  Final   Report Status 12/08/2020 FINAL  Final  Blood culture (routine single)     Status: None   Collection Time: 12/07/20  8:10 PM   Specimen: BLOOD  Result Value Ref Range Status   Specimen Description   Final    BLOOD BLOOD LEFT FOREARM Performed at [facility] [address] [place],  [address]    Special Requests   Final    BOTTLES DRAWN AEROBIC AND ANAEROBIC Blood  Culture results may not be optimal due to an excessive volume of blood received in culture bottles Performed at [facility] [address], [place], [place] [address]    Culture   Final    NO GROWTH 5 DAYS Performed at [facility], [place] [address] [place], [place] [address]    Report Status 12/13/2020 FINAL  Final  Resp Panel by RT-PCR (Flu A&B, Covid) Nasopharyngeal Swab     Status: None   Collection Time: 12/07/20  8:57 PM   Specimen: Nasopharyngeal Swab; Nasopharyngeal(NP) swabs in vial transport medium  Result Value Ref Range Status   SARS Coronavirus 2 by RT PCR NEGATIVE NEGATIVE Final    Comment: (NOTE) SARS-CoV-2 target nucleic acids are NOT DETECTED.  The SARS-CoV-2 RNA is generally detectable in upper respiratory specimens during the acute phase of infection. The lowest concentration of SARS-CoV-2 viral copies this assay can detect is 138 copies/mL. A negative result does not preclude SARS-Cov-2 infection and should not be used as the sole basis for treatment or other patient management decisions. A negative result may occur with  improper specimen collection/handling, submission of specimen other than nasopharyngeal swab, presence of viral mutation(s) within the areas targeted by this assay, and inadequate number of viral copies(<138 copies/mL). A negative result must be combined with clinical observations,  patient history, and epidemiological information. The expected result is Negative.  Fact Sheet for Patients:  EntrepreneurPulse.com.au  Fact Sheet for Healthcare Providers:  IncredibleEmployment.be  This test is no t yet approved or cleared by the [place] FDA and  has been authorized for detection and/or diagnosis of SARS-CoV-2 by FDA under an Emergency Use Authorization (EUA). This EUA will remain  in effect (meaning this test can be used) for the duration of the COVID-19 declaration under Section 564(b)(1) of the Act, 21 U.S.C.section 360bbb-3(b)(1), unless the authorization is terminated  or revoked sooner.       Influenza A by PCR NEGATIVE NEGATIVE Final   Influenza B by PCR NEGATIVE NEGATIVE Final    Comment: (NOTE) The Xpert Xpress SARS-CoV-2/FLU/RSV plus assay is intended as an aid in the diagnosis of influenza from Nasopharyngeal swab specimens and should not be used as a sole basis for treatment. Nasal washings and aspirates are unacceptable for Xpert Xpress SARS-CoV-2/FLU/RSV testing.  Fact Sheet for Patients: EntrepreneurPulse.com.au  Fact Sheet for Healthcare Providers: IncredibleEmployment.be  This test is not yet approved or cleared by the [place] FDA and has been authorized for detection and/or diagnosis of SARS-CoV-2 by FDA under an Emergency Use Authorization (EUA). This EUA will remain in effect (meaning this test can be used) for the duration of the COVID-19 declaration under Section 564(b)(1) of the Act, 21 U.S.C. section 360bbb-3(b)(1), unless the authorization is terminated or revoked.  Performed at [facility], [place] [address], [place], [place] [address]   MRSA PCR Screening     Status: Abnormal   Collection Time: 12/08/20  1:53 PM   Specimen: Nasopharyngeal  Result Value Ref Range Status   MRSA by PCR POSITIVE (A) NEGATIVE Final    Comment:         The GeneXpert MRSA Assay (FDA approved for NASAL specimens only), is one component of a comprehensive MRSA colonization surveillance program. It is not intended to diagnose MRSA infection nor to guide or monitor treatment for MRSA infections. RESULT CALLED TO, READ BACK BY AND VERIFIED WITH: ROBINSON,A. RN @1614  12/08/20 BILLINGSLEY,L Performed at [facility], [place]  [address], [place], [place] [address]      Studies: No results found.    Flora Lipps, MD  [facility] 12/16/2020  If 7PM-7AM, please contact night-coverage

## 2020-12-16 NOTE — Progress Notes (Signed)
Chaplain visited patient upon nurse referral.  When chaplain said who she was the patient replied "Thank God!!" Patient relayed frustration with care she rec'd before coming to [facility], and said "I can't go back there."  She said staff didn't respond to her needs, and she developed a bed sore.  Pt misses her condo neighbors and dog, but says her husband comes every day to see her.  She welcomed support and prayer.  She wants to get better so she can walk her dog and be with others.  She has family coming from [place] this winter and she feels that their visit will be the "best medicine." Chaplain offered prayer and will follow. Rev. Tamsen Snider  Pager [phone]

## 2020-12-16 NOTE — Plan of Care (Signed)
  Problem: Health Behavior/Discharge Planning: Goal: Ability to manage health-related needs will improve Outcome: Progressing   Problem: Clinical Measurements: Goal: Ability to maintain clinical measurements within normal limits will improve Outcome: Progressing Goal: Will remain free from infection Outcome: Progressing Goal: Diagnostic test results will improve Outcome: Progressing Goal: Cardiovascular complication will be avoided Outcome: Progressing   Problem: Activity: Goal: Risk for activity intolerance will decrease Outcome: Progressing   Problem: Nutrition: Goal: Adequate nutrition will be maintained Outcome: Progressing   Problem: Coping: Goal: Level of anxiety will decrease Outcome: Progressing   Problem: Elimination: Goal: Will not experience complications related to bowel motility Outcome: Progressing Goal: Will not experience complications related to urinary retention Outcome: Progressing   Problem: Safety: Goal: Ability to remain free from injury will improve Outcome: Progressing   Problem: Skin Integrity: Goal: Risk for impaired skin integrity will decrease Outcome: Progressing   Problem: Education: Goal: Knowledge of the prescribed therapeutic regimen will improve Outcome: Progressing Goal: Ability to verbalize activity precautions or restrictions will improve Outcome: Progressing Goal: Understanding of discharge needs will improve Outcome: Progressing   Problem: Activity: Goal: Ability to perform//tolerate increased activity and mobilize with assistive devices will improve Outcome: Progressing   Problem: Clinical Measurements: Goal: Postoperative complications will be avoided or minimized Outcome: Progressing   Problem: Self-Care: Goal: Ability to meet self-care needs will improve Outcome: Progressing   Problem: Self-Concept: Goal: Ability to maintain and perform role responsibilities to the fullest extent possible will improve Outcome:  Progressing   Problem: Pain Management: Goal: Pain level will decrease with appropriate interventions Outcome: Progressing   Problem: Skin Integrity: Goal: Demonstration of wound healing without infection will improve Outcome: Progressing

## 2020-12-16 NOTE — Consult Note (Addendum)
WOC follow-up:  Reviewed plan of care with physical therapy team via secure chat.  Although pt will not require further hydrotherapy after discharge, their team feels that the patient will benefit from this treatment while she is still in the hospital awaiting placement. Orders placed for hydrotherapy to continue Q Mon-Sat until discharge.    Topical treatment orders provided as follows: Cleanse wounds to sacrum and along BKA stump site with NS.  Apply Santyl to wound beds Q day Cover with NS moist gauze. Secure with dry gauze and ABD pad.  Wrap stump with kerlix/tape. PT WILL PERFORM DRESSING CHANGES TO SACRUM Q MON-SAT; BEDSIDE NURSE to change sacrum dressing Q Sun and BKA dressing Q day. Please re-consult if further assistance is needed.  Thank-you,  Julien Girt MSN, [place], [place], [place], [place]

## 2020-12-16 NOTE — Progress Notes (Signed)
PT HYDROTHERAPY TREATMENT       12/16/20 1200  Subjective Assessment  Subjective "I got bad news. I can't get into the facility I wanted"  Patient and Family Stated Goals agreed to wound care  Date of Onset  (present on admission)  Prior Treatments PLS/hydrotherapy with PT last admission  Evaluation and Treatment  Evaluation and Treatment Procedures Explained to Patient/Family Yes  Evaluation and Treatment Procedures agreed to  Wound / Incision (Open or Dehisced) 12/09/20 Other (Comment) Sacrum Medial open nonstageable sacral wound, malodorous with slough.  Date First Assessed: 12/09/20   Wound Type: Other (Comment)  Location: Sacrum  Location Orientation: Medial  Wound Description (Comments): open nonstageable sacral wound, malodorous with slough.  Dressing Type Gauze (Comment);Foam - Lift dressing to assess site every shift;Barrier Film (skin prep)  Dressing Changed Changed  Dressing Status Old drainage  Dressing Change Frequency Daily  Site / Wound Assessment Bleeding;Pink;Red;Yellow;Brown  % Wound base Red or Granulating 70%  % Wound base Yellow/Fibrinous Exudate 30% (some is likely fibrous tissue)  Peri-wound Assessment Pink;Maceration  Margins Unattached edges (unapproximated)  Drainage Amount Moderate  Drainage Description Serosanguineous  Treatment Debridement (Selective);Hydrotherapy (Pulse lavage);Packing (Saline gauze) (Santyl)  Hydrotherapy  Pulsed Lavage with Suction (psi) 8 psi  Pulsed Lavage with Suction - Normal Saline Used 1000 mL  Pulsed Lavage Tip Tip with splash shield  Pulsed lavage therapy - wound location sacrum  Selective Debridement  Selective Debridement - Location sacrum  Selective Debridement - Tools Used Forceps;Scissors  Selective Debridement - Tissue Removed small amount of slough  Wound Therapy - Assess/Plan/Recommendations  Wound Therapy - Clinical Statement wound continues to have stringy adhered slough. pt tolerated procedure well. will  continue hydrotherapy/PLS/Santyl in house.  Wound Therapy - Functional Problem List bed bound  Factors Delaying/Impairing Wound Healing Incontinence;Multiple medical problems  Hydrotherapy Plan Debridement;Dressing change;Pulsatile lavage with suction  Wound Therapy - Frequency 6X / week  Wound Therapy - Current Recommendations Case manager/social work  Wound Therapy - Follow Up Recommendations Skilled nursing facility [facility])  Wound Plan Continue hydrotherapy/PLS, enzymatic debridement, and dressing changes  Wound Therapy Goals - Improve the function of patient's integumentary system by progressing the wound(s) through the phases of wound healing by:  Decrease Necrotic Tissue to 25  Decrease Necrotic Tissue - Progress Progressing toward goal  Increase Granulation Tissue to 75  Increase Granulation Tissue - Progress Progressing toward goal  Improve Drainage Characteristics Min  Improve Drainage Characteristics - Progress Progressing toward goal  Goals/treatment plan/discharge plan were made with and agreed upon by patient/family Yes  Time For Goal Achievement 2 weeks  Wound Therapy - Potential for Goals Sheria Lang, PT Acute Rehabilitation  Office: [phone] Pager: [phone]

## 2020-12-16 NOTE — Progress Notes (Signed)
Nutrition Follow-up  DOCUMENTATION CODES:   Non-severe (moderate) malnutrition in context of chronic illness  INTERVENTION:  - continue Ensure Enlive BID and Juven BID.  NUTRITION DIAGNOSIS:   Moderate Malnutrition related to chronic illness (stage 3 CKD) as evidenced by mild fat depletion,mild muscle depletion,moderate muscle depletion. -ongoing  GOAL:   Patient will meet greater than or equal to 90% of their needs -met  MONITOR:   PO intake,Supplement acceptance,Labs,Weight trends  ASSESSMENT:   59 year old female with medical history of chronic stage 3 pressure injury, multiple L heel wounds, type 1 DM, renal and pancreas transplant (1989), stage 3 CKD, HTN, prior R BKA d/t osteomyelitis. She was hospitalized from 11/13-11/17 due to coffee-ground emesis and was found to have severe esophagitis. She was then discharged back to SNF and again presented to the ED on 12/12 with AMS and increased drainage and odor from sacral wound with concern for infection.  Patient has been eating 75-100% of meals over the past 4 days. She has been accepting Ensure Enlive 100% of the time offered and Juven nearly 100% of the time offered since orders placed on 12/14.   Weight was up 4 lb from 12/16-12/18 and no weight recorded since 12/18. Mild pitting edema to RLE and moderate pitting edema to LLE documented in the flow sheet.    Labs reviewed; CBGs: 228, 195, 146, 126 mg/dl. Medications reviewed; 1 mg folvite/day, sliding scale novolog, 8 units lantus BID, 50 mcg oral synthroid/day, 1 tablet multivitamin with minerals/day, 40 mg oral protonix BID, 5 mg deltasone/day, 650 mg sodium bicarb BID, 100 mcg oral cyanocobalamin/day.    Diet Order:   Diet Order            Diet Carb Modified Fluid consistency: Thin; Room service appropriate? Yes  Diet effective now                 EDUCATION NEEDS:   No education needs have been identified at this time  Skin:  Skin Assessment: Skin Integrity  Issues: Skin Integrity Issues:: Stage II,Unstageable,Incisions Stage II: L hip; L heel Unstageable: sacrum Incisions: L hand (12/16) Other: bilateral buttocks  Last BM:  12/20 (type 2 x2)  Height:   Ht Readings from Last 1 Encounters:  12/09/20 '5\' 3"'  (1.6 m)    Weight:   Wt Readings from Last 1 Encounters:  12/13/20 60 kg    Estimated Nutritional Needs:  Kcal:  1825-2090 kcal Protein:  90-105 grams Fluid:  >/= 2 L/day     Jarome Matin, MS, RD, LDN, CNSC Inpatient Clinical Dietitian RD pager # available in [facility]  After hours/weekend pager # available in [facility]

## 2020-12-17 LAB — GLUCOSE, CAPILLARY
Glucose-Capillary: 110 mg/dL — ABNORMAL HIGH (ref 70–99)
Glucose-Capillary: 111 mg/dL — ABNORMAL HIGH (ref 70–99)
Glucose-Capillary: 130 mg/dL — ABNORMAL HIGH (ref 70–99)
Glucose-Capillary: 232 mg/dL — ABNORMAL HIGH (ref 70–99)
Glucose-Capillary: 274 mg/dL — ABNORMAL HIGH (ref 70–99)
Glucose-Capillary: 288 mg/dL — ABNORMAL HIGH (ref 70–99)

## 2020-12-17 LAB — CBC
HCT: 26.5 % — ABNORMAL LOW (ref 36.0–46.0)
Hemoglobin: 8.2 g/dL — ABNORMAL LOW (ref 12.0–15.0)
MCH: 33.3 pg (ref 26.0–34.0)
MCHC: 30.9 g/dL (ref 30.0–36.0)
MCV: 107.7 fL — ABNORMAL HIGH (ref 80.0–100.0)
Platelets: 114 10*3/uL — ABNORMAL LOW (ref 150–400)
RBC: 2.46 MIL/uL — ABNORMAL LOW (ref 3.87–5.11)
RDW: 22.8 % — ABNORMAL HIGH (ref 11.5–15.5)
WBC: 3.4 10*3/uL — ABNORMAL LOW (ref 4.0–10.5)
nRBC: 0.6 % — ABNORMAL HIGH (ref 0.0–0.2)

## 2020-12-17 LAB — BASIC METABOLIC PANEL
Anion gap: 9 (ref 5–15)
BUN: 60 mg/dL — ABNORMAL HIGH (ref 8–23)
CO2: 21 mmol/L — ABNORMAL LOW (ref 22–32)
Calcium: 8.9 mg/dL (ref 8.9–10.3)
Chloride: 109 mmol/L (ref 98–111)
Creatinine, Ser: 1.17 mg/dL — ABNORMAL HIGH (ref 0.44–1.00)
GFR, Estimated: 53 mL/min — ABNORMAL LOW (ref >60)
Glucose, Bld: 132 mg/dL — ABNORMAL HIGH (ref 70–99)
Potassium: 4.9 mmol/L (ref 3.5–5.1)
Sodium: 139 mmol/L (ref 135–145)

## 2020-12-17 NOTE — Plan of Care (Signed)
  Problem: Health Behavior/Discharge Planning: Goal: Ability to manage health-related needs will improve Outcome: Progressing   Problem: Clinical Measurements: Goal: Ability to maintain clinical measurements within normal limits will improve Outcome: Progressing Goal: Will remain free from infection Outcome: Progressing Goal: Diagnostic test results will improve Outcome: Progressing Goal: Cardiovascular complication will be avoided Outcome: Progressing   Problem: Activity: Goal: Risk for activity intolerance will decrease Outcome: Progressing   Problem: Nutrition: Goal: Adequate nutrition will be maintained Outcome: Progressing

## 2020-12-17 NOTE — Progress Notes (Signed)
PT HYDROTHERAPY     12/17/20 1300  Subjective Assessment  Subjective "i'm okay"  Patient and Family Stated Goals agreed to wound care  Date of Onset  (present on admission)  Prior Treatments PLS/hydrotherapy with PT last admission  Evaluation and Treatment  Evaluation and Treatment Procedures Explained to Patient/Family Yes  Evaluation and Treatment Procedures agreed to  Wound / Incision (Open or Dehisced) 12/09/20 Other (Comment) Sacrum Medial open nonstageable sacral wound, malodorous with slough.  Date First Assessed: 12/09/20   Wound Type: Other (Comment)  Location: Sacrum  Location Orientation: Medial  Wound Description (Comments): open nonstageable sacral wound, malodorous with slough.  Dressing Type Gauze (Comment);Foam - Lift dressing to assess site every shift  Dressing Status Old drainage  Dressing Change Frequency Daily  Site / Wound Assessment Bleeding;Pink;Red;Yellow;Brown  % Wound base Red or Granulating 70%  % Wound base Yellow/Fibrinous Exudate 30%  Peri-wound Assessment Maceration;Pink (worsening maceration)  Margins Unattached edges (unapproximated)  Drainage Amount Moderate  Drainage Description Serosanguineous  Treatment Debridement (Selective);Hydrotherapy (Pulse lavage);Packing (Saline gauze) (Santyl)  Hydrotherapy  Pulsed Lavage with Suction (psi) 8 psi  Pulsed Lavage with Suction - Normal Saline Used 1000 mL  Pulsed Lavage Tip Tip with splash shield  Pulsed lavage therapy - wound location sacrum  Selective Debridement  Selective Debridement - Location sacrum  Selective Debridement - Tools Used Forceps;Scissors  Selective Debridement - Tissue Removed very small amount of slough  Wound Therapy - Assess/Plan/Recommendations  Wound Therapy - Clinical Statement wound continues to have some stringy adhered slough. worsening periwound maceration...MASD?--consulted with WTA (PT) about need for different dressing to help manage drainage. pt tolerated procedure  well. will continue hydrotherapy/PLS/Santyl in house.  Wound Therapy - Functional Problem List bed bound  Factors Delaying/Impairing Wound Healing Incontinence;Multiple medical problems  Hydrotherapy Plan Debridement;Dressing change;Pulsatile lavage with suction  Wound Therapy - Frequency 6X / week  Wound Therapy - Current Recommendations Case manager/social work  Wound Therapy - Follow Up Recommendations Skilled nursing facility [facility])  Wound Plan Continue hydrotherapy/PLS, enzymatic debridement, and dressing changes  Wound Therapy Goals - Improve the function of patient's integumentary system by progressing the wound(s) through the phases of wound healing by:  Decrease Necrotic Tissue to 25  Decrease Necrotic Tissue - Progress Progressing toward goal  Increase Granulation Tissue to 75  Increase Granulation Tissue - Progress Progressing toward goal  Improve Drainage Characteristics Min  Improve Drainage Characteristics - Progress Progressing toward goal  Goals/treatment plan/discharge plan were made with and agreed upon by patient/family Yes  Time For Goal Achievement 2 weeks  Wound Therapy - Potential for Goals Sheria Lang, PT Acute Rehabilitation  Office: [phone] Pager: [phone]

## 2020-12-17 NOTE — TOC Progression Note (Signed)
Transition of Care [facility]) - Progression Note    Patient Details  Name: [name] MRN: [id number] Date of Birth: 05-Mar-1961  Transition of Care [facility]) CM/SW Contact  Nancee Brownrigg, Juliann Pulse, RN Phone Number: 12/17/2020, 12:20 PM  Clinical Narrative:  Awaiting outcome from Expidited appeal per [facility] rep Raquel Sarna.     Expected Discharge Plan: Long Term Acute Care (LTAC) Barriers to Discharge: Insurance Authorization  Expected Discharge Plan and Services Expected Discharge Plan: Long Term Acute Care (LTAC)   Discharge Planning Services: CM Consult Post Acute Care Choice: [place] Living arrangements for the past 2 months: Single Family Home                                       Social Determinants of Health (SDOH) Interventions    Readmission Risk Interventions No flowsheet data found.

## 2020-12-17 NOTE — Progress Notes (Signed)
PROGRESS NOTE    [name]  SJG:[id number] DOB: 1961/12/02 DOA: 12/07/2020 PCP: [name], MD    Chief Complaint  Patient presents with  . Altered Mental Status    Patient here via ems from facility with c/o AMS with yellow drainage from sacral ulcer with odor.    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 Patient was then admitted to the hospital for further evaluation and treatment.   Assessment & Plan:   Active Problems:   Anemia secondary to renal failure   History of simultaneous kidney and pancreas transplant ([place])   Chronic kidney disease (CKD) stage G3a/A1, moderately decreased glomerular filtration rate (GFR) between 45-59 mL/min/1.73 square meter and albuminuria creatinine ratio less than 30 mg/g (HCC)   Hypertension   Acute kidney injury superimposed on CKD (HCC)   Type 1 diabetes mellitus with complication, with long term current use of insulin pump (HCC)   Pressure injury of skin   Long-term use of immunosuppressant medication   Sepsis ([place])   Hypoalbuminemia   Hyponatremia   CAP (community acquired pneumonia)   Acute urinary retention   Acute metabolic encephalopathy   Sacral decubitus ulcer, stage III (HCC)   Cellulitis   Hypotension   Malodorous urine   Erosive esophagitis   Hypothyroid   Depression   Type 1 diabetes mellitus with hyperlipidemia (HCC)   Malnutrition of moderate degree   Acute metabolic encephalopathy secondary to infected  sacral ulcer next appears to have resolved Completed 7 days of antibiotics.     Cellulitis of the unstageable sacral decubitus ulcer CT of the pelvis just not show any osteomyelitis.  She completed 7 days of antibiotics at this time and currently getting hydrotherapy by PT  Acute urinary retention with bacteriuria Probably colonization from [place].   AKI on stage IIIa CKD s/p renal transplant Baseline creatinine around 1.2-1.3. Continue with Imuran, prednisone and cyclosporine.    Type I diabetic s/p pancreas transplant, poorly controlled with episodes of hypoglycemia next  Hemoglobin A1c at 7.2 CBG (last 3)  Recent Labs    12/17/20 0737 12/17/20 1145 12/17/20 1553  GLUCAP 111* 232* 288*   Currently elevated CBGs with hyperglycemia .     S/p BKA for subacute osteomyelitis    History of erosive esophagitis Stable continue with PPI twice daily.   Hypothyroidism Continue with Synthroid.   Hyperlipidemia Continue with Crestor    Depression Continue with Zoloft.   Hypophosphatemia Replaced  Pressure injury present on admission Pressure Injury 09/27/20 Sacrum Medial Unstageable - Full thickness tissue loss in which the base of the injury is covered by slough (yellow, tan, gray, green or brown) and/or eschar (tan, brown or black) in the wound bed. (Active)  09/27/20 1534  Location: Sacrum  Location Orientation: Medial  Staging: Unstageable - Full thickness tissue loss in which the base of the injury is covered by slough (yellow, tan, gray, green or brown) and/or eschar (tan, brown or black) in the wound bed.  Wound Description (Comments):   Present on Admission: Yes     Pressure Injury  12/10/20 Hip Left;Posterior;Lateral Stage 2 -  Partial thickness loss of dermis presenting as a shallow open injury with a red, pink wound bed without slough. (Active)  12/10/20 1325  Location: Hip  Location Orientation: Left;Posterior;Lateral  Staging: Stage 2 -  Partial  thickness loss of dermis presenting as a shallow open injury with a red, pink wound bed without slough.  Wound Description (Comments):   Present on Admission: Yes (present on transfer to room 1402)     Pressure Injury 12/10/20 Heel Left;Posterior Stage 2 -  Partial thickness loss of dermis presenting as a shallow open injury with a red, pink wound bed without slough. (Active)  12/10/20 1325  Location: Heel  Location Orientation: Left;Posterior  Staging: Stage 2 -  Partial thickness loss of dermis presenting as a shallow open injury with a red, pink wound bed without slough.  Wound Description (Comments):   Present on Admission: Yes (upon transfer to 1402)   Wound care consulted and recommendations given Patient currently getting hydrotherapy from PT.    Moderate protein calorie malnutrition Dietary on and supplementations added.       DVT prophylaxis: SCDs Code Status: Full code Family Communication: (None at bedside Disposition:   Status is: Inpatient  Remains inpatient appropriate because:Unsafe d/c plan and Inpatient level of care appropriate due to severity of illness   Dispo: The patient is from: SNF              Anticipated d/c is to: LTAC              Anticipated d/c date is: > 3 days              Patient currently is medically stable to d/c.       Consultants:   Wound care  Procedures: None Antimicrobials: None  Subjective: No new complaints.  Objective: Vitals:   12/16/20 1317 12/16/20 2027 12/17/20 0351 12/17/20 1147  BP: 134/61 138/73 (!) 141/72 132/69  Pulse: 75 80 75 75  Resp: 18 18 18 17   Temp: (!) 97.4 F (36.3 C)  97.9 F (36.6 C) 98.3 F (36.8 C)  TempSrc: Oral     SpO2: 99% 98% 99% 100%  Weight:      Height:        Intake/Output Summary (Last 24 hours) at 12/17/2020 1745 Last data filed at 12/17/2020 0454 Gross per 24 hour  Intake 240 ml  Output 1100 ml  Net -860 ml   Filed Weights   12/07/20 2356 12/11/20 0424 12/13/20  0619  Weight: 52.2 kg 58 kg 60 kg    Examination:  General exam: Elderly lady not in any kind of distress Respiratory system: Clear to auscultation. Respiratory effort normal. Cardiovascular system: S1 & S2 heard, RRR. No JVD, Gastrointestinal system: Abdomen is nondistended, soft and nontender.Normal bowel sounds heard. Central nervous system: Alert and answering questions appropriately Extremities: Right BKA Skin: Sacral decubitus ulceration unstageable  psychiatry: Mood is appropriate   Data Reviewed: I have personally reviewed following labs and imaging studies  CBC: Recent Labs  Lab 12/11/20 0528 12/12/20 1056 12/14/20 0545 12/15/20 0502 12/16/20 0510 12/17/20 0602  WBC 5.0 4.3 3.3* 3.6*  --  3.4*  HGB 7.2* 7.3* 6.3* 8.1* 8.6* 8.2*  HCT 23.0* 23.4* 20.2* 25.4* 28.0* 26.5*  MCV 107.0* 108.8* 109.2* 104.1*  --  107.7*  PLT 155 127* 108* 102*  --  114*    Basic Metabolic Panel: Recent Labs  Lab 12/11/20 0528 12/12/20 1056 12/14/20 0545 12/17/20  0602  NA 134* 135 137 139  K 3.7 4.5 4.2 4.9  CL 96* 101 104 109  CO2 27 25 22  21*  GLUCOSE 335* 176* 261* 132*  BUN 71* 61* 60* 60*  CREATININE 1.40* 1.19* 0.98 1.17*  CALCIUM 8.5* 8.6* 8.3* 8.9  MG 1.8 1.7  --   --   PHOS 1.9* 2.2* 3.1  --     GFR: Estimated Creatinine Clearance: 41.8 mL/min (A) (by C-G formula based on SCr of 1.17 mg/dL (H)).  Liver Function Tests: Recent Labs  Lab 12/11/20 0528  AST 13*  ALT 8  ALKPHOS 69  BILITOT 0.7  PROT 4.7*  ALBUMIN 1.6*    CBG: Recent Labs  Lab 12/17/20 0029 12/17/20 0349 12/17/20 0737 12/17/20 1145 12/17/20 1553  GLUCAP 130* 110* 111* 232* 288*     Recent Results (from the past 240 hour(s))  Urine culture     Status: Abnormal   Collection Time: 12/07/20  8:06 PM   Specimen: Urine, Catheterized  Result Value Ref Range Status   Specimen Description   Final    URINE, CATHETERIZED Performed at [facility], [place] [address],  [place], [place] [address]    Special Requests   Final    NONE Performed at [facility], [place] [address], [place], [place] [address]    Culture 90,000 COLONIES/mL YEAST (A)  Final   Report Status 12/08/2020 FINAL  Final  Blood culture (routine single)     Status: None   Collection Time: 12/07/20  8:10 PM   Specimen: BLOOD  Result Value Ref Range Status   Specimen Description   Final    BLOOD BLOOD LEFT FOREARM Performed at [place] [address], [place], [place] [address]    Special Requests   Final    BOTTLES DRAWN AEROBIC AND ANAEROBIC Blood Culture results may not be optimal due to an excessive volume of blood received in culture bottles Performed at [place] [address] [place], [place] [address]    Culture   Final    NO GROWTH 5 DAYS Performed at [facility], [place] [address], [place], [place] [address]    Report Status 12/13/2020 FINAL  Final  Resp Panel by RT-PCR (Flu A&B, Covid) Nasopharyngeal Swab     Status: None   Collection Time: 12/07/20  8:57 PM   Specimen: Nasopharyngeal Swab; Nasopharyngeal(NP) swabs in vial transport medium  Result Value Ref Range Status   SARS Coronavirus 2 by RT PCR NEGATIVE NEGATIVE Final    Comment: (NOTE) SARS-CoV-2 target nucleic acids are NOT DETECTED.  The SARS-CoV-2 RNA is generally detectable in upper respiratory specimens during the acute phase of infection. The lowest concentration of SARS-CoV-2 viral copies this assay can detect is 138 copies/mL. A negative result does not preclude SARS-Cov-2 infection and should not be used as the sole basis for treatment or other patient management decisions. A negative result may occur with  improper specimen collection/handling, submission of specimen other than nasopharyngeal swab, presence of viral mutation(s) within the areas targeted by this assay, and inadequate number of viral copies(<138 copies/mL). A negative  result must be combined with clinical observations, patient history, and epidemiological information. The expected result is Negative.  Fact Sheet for Patients:  EntrepreneurPulse.com.au  Fact Sheet for Healthcare Providers:  IncredibleEmployment.be  This test is no t yet approved or cleared by the [place] FDA and  has been authorized for detection and/or diagnosis  of SARS-CoV-2 by FDA under an Emergency Use Authorization (EUA). This EUA will remain  in effect (meaning this test can be used) for the duration of the COVID-19 declaration under Section 564(b)(1) of the Act, 21 U.S.C.section 360bbb-3(b)(1), unless the authorization is terminated  or revoked sooner.       Influenza A by PCR NEGATIVE NEGATIVE Final   Influenza B by PCR NEGATIVE NEGATIVE Final    Comment: (NOTE) The Xpert Xpress SARS-CoV-2/FLU/RSV plus assay is intended as an aid in the diagnosis of influenza from Nasopharyngeal swab specimens and should not be used as a sole basis for treatment. Nasal washings and aspirates are unacceptable for Xpert Xpress SARS-CoV-2/FLU/RSV testing.  Fact Sheet for Patients: EntrepreneurPulse.com.au  Fact Sheet for Healthcare Providers: IncredibleEmployment.be  This test is not yet approved or cleared by the [place] FDA and has been authorized for detection and/or diagnosis of SARS-CoV-2 by FDA under an Emergency Use Authorization (EUA). This EUA will remain in effect (meaning this test can be used) for the duration of the COVID-19 declaration under Section 564(b)(1) of the Act, 21 U.S.C. section 360bbb-3(b)(1), unless the authorization is terminated or revoked.  Performed at [facility], [place] [address], [place], [place] [address]   MRSA PCR Screening     Status: Abnormal   Collection Time: 12/08/20  1:53 PM   Specimen: Nasopharyngeal  Result Value Ref Range Status    MRSA by PCR POSITIVE (A) NEGATIVE Final    Comment:        The GeneXpert MRSA Assay (FDA approved for NASAL specimens only), is one component of a comprehensive MRSA colonization surveillance program. It is not intended to diagnose MRSA infection nor to guide or monitor treatment for MRSA infections. RESULT CALLED TO, READ BACK BY AND VERIFIED WITH: ROBINSON,A. RN @1614  12/08/20 BILLINGSLEY,L Performed at [facility], [place] [address], [place], [place] [address]          Radiology Studies: No results found.      Scheduled Meds: . azaTHIOprine  50 mg Oral Daily  . Chlorhexidine Gluconate Cloth  6 each Topical Daily  . collagenase   Topical Daily  . cycloSPORINE  75 mg Oral BID  . feeding supplement  237 mL Oral BID BM  . folic acid  1 mg Oral Daily  . insulin aspart  0-9 Units Subcutaneous Q4H  . insulin glargine  8 Units Subcutaneous BID  . levothyroxine  50 mcg Oral Q0600  . mouth rinse  15 mL Mouth Rinse BID  . metoprolol tartrate  12.5 mg Oral BID  . multivitamin with minerals  1 tablet Oral Daily  . nutrition supplement (JUVEN)  1 packet Oral BID BM  . nystatin   Topical TID  . pantoprazole  40 mg Oral BID  . predniSONE  5 mg Oral Q breakfast  . rosuvastatin  5 mg Oral QHS  . sertraline  25 mg Oral Daily  . sodium bicarbonate  650 mg Oral BID  . vitamin B-12  100 mcg Oral Daily   Continuous Infusions:   LOS: 10 days        Hosie Poisson, MD [facility]   To contact the attending provider between 7A-7P or the covering provider during after hours 7P-7A, please log into the web site [email or web address] and access using universal [place] password for that web site. If you do not have the password, please call the hospital operator.  12/17/2020, 5:45 PM

## 2020-12-18 LAB — GLUCOSE, CAPILLARY
Glucose-Capillary: 134 mg/dL — ABNORMAL HIGH (ref 70–99)
Glucose-Capillary: 144 mg/dL — ABNORMAL HIGH (ref 70–99)
Glucose-Capillary: 167 mg/dL — ABNORMAL HIGH (ref 70–99)
Glucose-Capillary: 256 mg/dL — ABNORMAL HIGH (ref 70–99)
Glucose-Capillary: 270 mg/dL — ABNORMAL HIGH (ref 70–99)
Glucose-Capillary: 340 mg/dL — ABNORMAL HIGH (ref 70–99)
Glucose-Capillary: 81 mg/dL (ref 70–99)

## 2020-12-18 MED ORDER — INSULIN ASPART 100 UNIT/ML ~~LOC~~ SOLN
2.0000 [IU] | Freq: Three times a day (TID) | SUBCUTANEOUS | Status: DC
  Administered 2020-12-19 – 2020-12-22 (×11): 2 [IU] via SUBCUTANEOUS

## 2020-12-18 MED ORDER — INSULIN ASPART 100 UNIT/ML ~~LOC~~ SOLN
0.0000 [IU] | Freq: Three times a day (TID) | SUBCUTANEOUS | Status: DC
  Administered 2020-12-19: 2 [IU] via SUBCUTANEOUS
  Administered 2020-12-19: 1 [IU] via SUBCUTANEOUS
  Administered 2020-12-19 – 2020-12-21 (×4): 2 [IU] via SUBCUTANEOUS
  Administered 2020-12-21 (×2): 3 [IU] via SUBCUTANEOUS
  Administered 2020-12-22: 2 [IU] via SUBCUTANEOUS
  Administered 2020-12-22: 3 [IU] via SUBCUTANEOUS
  Administered 2020-12-24 (×2): 2 [IU] via SUBCUTANEOUS
  Administered 2020-12-24: 3 [IU] via SUBCUTANEOUS
  Administered 2020-12-25: 2 [IU] via SUBCUTANEOUS
  Administered 2020-12-25: 1 [IU] via SUBCUTANEOUS
  Administered 2020-12-26 – 2020-12-29 (×4): 2 [IU] via SUBCUTANEOUS
  Administered 2020-12-29: 1 [IU] via SUBCUTANEOUS
  Administered 2020-12-31: 2 [IU] via SUBCUTANEOUS

## 2020-12-18 NOTE — Progress Notes (Signed)
Inpatient Diabetes Program Recommendations  AACE/ADA: New Consensus Statement on Inpatient Glycemic Control (2015)  Target Ranges:  Prepandial:   less than 140 mg/dL      Peak postprandial:   less than 180 mg/dL (1-2 hours)      Critically ill patients:  140 - 180 mg/dL   Lab Results  Component Value Date   GLUCAP 144 (H) 12/18/2020   HGBA1C 8.6 (H) 12/08/2020    Review of Glycemic Control Results for BLESSING, ZAUCHA (MRN [id number]) as of 12/18/2020 12:42  Ref. Range 12/17/2020 07:37 12/17/2020 11:45 12/17/2020 15:53 12/17/2020 20:16 12/18/2020 00:14 12/18/2020 04:02 12/18/2020 06:07 12/18/2020 07:44 12/18/2020 12:31  Glucose-Capillary Latest Ref Range: 70 - 99 mg/dL 111 (H) 232 (H) 288 (H) 274 (H) 340 (H) 167 (H) 134 (H) 81 144 (H)   Diabetes history: DM 1 Outpatient Diabetes medications:  Lantus 8 units bid, Humalog 2 units tid with meals Current orders for Inpatient glycemic control:  Novolog sensitive q 4 hours Lantus 8 units bid  Inpatient Diabetes Program Recommendations:    Consider changing Novolog correction to tid with meals and HS scale (instead of q 4 hours). Also consider adding Novolog meal coverage 2 units tid with meals (hold If patient eats less than 50% or NPO).  Thanks,  Adah Perl, RN, BC-ADM Inpatient Diabetes Coordinator Pager [phone] (8a-5p)

## 2020-12-18 NOTE — Progress Notes (Signed)
Physical Therapy Hydrotherapy Treatment  Pt with maceration to periwound so added hydrofiber (cut to fit directly over wound opening) on top of saline moistened gauze packing (gauze to pack undermining areas).      12/18/20 1315  Subjective Assessment  Subjective "merry christmas eve! in case I forget tomorrow"  Patient and Family Stated Goals agreed to wound care  Date of Onset  (present on admission)  Prior Treatments PLS/hydrotherapy with PT last admission  Evaluation and Treatment  Evaluation and Treatment Procedures Explained to Patient/Family Yes  Evaluation and Treatment Procedures agreed to  Wound / Incision (Open or Dehisced) 12/09/20 Other (Comment) Sacrum Medial open nonstageable sacral wound, malodorous with slough.  Date First Assessed: 12/09/20   Wound Type: Other (Comment)  Location: Sacrum  Location Orientation: Medial  Wound Description (Comments): open nonstageable sacral wound, malodorous with slough.  Dressing Type Foam - Lift dressing to assess site every shift;Gauze (Comment);Normal saline moist dressing;Other (Comment) (santyl)  Dressing Changed Changed  Dressing Change Frequency Daily  Site / Wound Assessment Bleeding;Painful;Pink;Red;Yellow  % Wound base Red or Granulating 70%  % Wound base Yellow/Fibrinous Exudate 30%  Peri-wound Assessment Maceration;Pink  Margins Unattached edges (unapproximated)  Drainage Amount Copious (however dressing does not appear to have been changed since last hydrotherapy session)  Drainage Description Serosanguineous  Treatment Debridement (Selective);Hydrotherapy (Pulse lavage);Packing (Saline gauze) (Santyl)  Hydrotherapy  Pulsed Lavage with Suction (psi) 8 psi  Pulsed Lavage with Suction - Normal Saline Used 1000 mL  Pulsed Lavage Tip Tip with splash shield  Pulsed lavage therapy - wound location sacrum  Selective Debridement  Selective Debridement - Location sacrum  Selective Debridement - Tools Used Forceps;Scissors   Selective Debridement - Tissue Removed very small amount of slough  Wound Therapy - Assess/Plan/Recommendations  Wound Therapy - Clinical Statement wound continues to have stringy adhered slough. Pt with periwound maceration so added hydrofiber to absorb more exudate.  Requested RN pass along to nigh shift RN to check under foam dressing to ensure periwound stays dry.  Pt with occasional pain however tolerable and agreeable to continue.  Will continue hydrotherapy/PLS/Santyl while pt remains hospitalized.  Wound Therapy - Functional Problem List bed bound  Factors Delaying/Impairing Wound Healing Incontinence;Multiple medical problems  Hydrotherapy Plan Debridement;Dressing change;Pulsatile lavage with suction  Wound Therapy - Frequency 6X / week  Wound Therapy - Current Recommendations Case manager/social work  Wound Therapy - Follow Up Recommendations [facility]illed nursing facility [facility])  Wound Plan Continue hydrotherapy/PLS, enzymatic debridement, and dressing changes  Wound Therapy Goals - Improve the function of patient's integumentary system by progressing the wound(s) through the phases of wound healing by:  Decrease Necrotic Tissue to 25  Decrease Necrotic Tissue - Progress Progressing toward goal  Increase Granulation Tissue to 75  Increase Granulation Tissue - Progress Progressing toward goal  Improve Drainage Characteristics Min  Improve Drainage Characteristics - Progress Progressing toward goal  Goals/treatment plan/discharge plan were made with and agreed upon by patient/family Yes  Time For Goal Achievement 2 weeks  Wound Therapy - Potential for Goals [place]   Time: 9509 - [place], DPT Acute Rehabilitation Services Pager: [phone] Office: [phone]

## 2020-12-18 NOTE — Progress Notes (Signed)
PROGRESS NOTE    [name]  AOZ:[id number] DOB: [birth year] DOA: 12/07/2020 PCP: [name], MD    Chief Complaint  Patient presents with  . Altered Mental Status    Patient here via ems from facility with c/o AMS with yellow drainage from sacral ulcer with odor.    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 Patient was then admitted to the hospital for further evaluation and treatment. Patient completed a 7 days of antibiotics and currently waiting for LTAC placement.   Assessment & Plan:   Active Problems:   Anemia secondary to renal failure   History of simultaneous kidney and pancreas transplant ([place])   Chronic kidney disease (CKD) stage G3a/A1, moderately decreased glomerular filtration rate (GFR) between 45-59 mL/min/1.73 square meter and albuminuria creatinine ratio less than 30 mg/g (HCC)   Hypertension   Acute kidney injury superimposed on CKD (HCC)   Type 1 diabetes mellitus with complication, with long term current use of insulin pump (HCC)   Pressure injury of skin   Long-term use of immunosuppressant medication   Sepsis ([place])   Hypoalbuminemia   Hyponatremia   CAP (community acquired pneumonia)   Acute urinary retention   Acute metabolic encephalopathy   Sacral decubitus ulcer, stage III (HCC)   Cellulitis   Hypotension   Malodorous urine   Erosive esophagitis   Hypothyroid   Depression   Type 1 diabetes mellitus with hyperlipidemia (HCC)    Malnutrition of moderate degree   Acute metabolic encephalopathy secondary to infected sacral ulcer next appears to have resolved Completed 7 days of antibiotics.  Patient is alert and answering all questions appropriately    Cellulitis of the unstageable sacral decubitus ulcer CT of the pelvis just not show any osteomyelitis.  She completed 7 days of antibiotics at this time and currently getting hydrotherapy by PT  Acute urinary retention with bacteriuria Probably colonization from yeast.    AKI on stage IIIa CKD s/p renal transplant Baseline creatinine around 1.2-1.3. Continue with Imuran, prednisone and cyclosporine. No changes in meds.     Type I diabetic s/p pancreas transplant, poorly controlled with episodes of hypoglycemia next  Hemoglobin A1c at 7.2 CBG (last 3)  Recent Labs    12/18/20 0744 12/18/20 1231 12/18/20 1633  GLUCAP 81 144* 256*   Currently elevated CBGs with hyperglycemia . Added 2 units tidac if eating more than 50% of meals.     S/p BKA for subacute osteomyelitis    History of erosive esophagitis Stable continue with PPI twice daily.   Hypothyroidism Continue with Synthroid.   Hyperlipidemia Continue with Crestor  Thrombocytopenia Platelets 114000. Repeat in am.   Depression Continue with Zoloft.   Hypophosphatemia Replaced  Pressure injury present on admission Pressure Injury 09/27/20 Sacrum Medial Unstageable - Full thickness tissue loss in which the base of the injury is covered by slough (yellow, tan, gray, green or brown) and/or eschar (tan, brown or black) in the wound bed. (Active)  09/27/20 1534  Location: Sacrum  Location Orientation: Medial  Staging: Unstageable - Full thickness tissue  loss in which the base of the injury is covered by slough (yellow, tan, gray, green or brown) and/or eschar (tan, brown or black) in the wound bed.  Wound Description (Comments):   Present on Admission: Yes     Pressure Injury  12/10/20 Hip Left;Posterior;Lateral Stage 2 -  Partial thickness loss of dermis presenting as a shallow open injury with a red, pink wound bed without slough. (Active)  12/10/20 1325  Location: Hip  Location Orientation: Left;Posterior;Lateral  Staging: Stage 2 -  Partial thickness loss of dermis presenting as a shallow open injury with a red, pink wound bed without slough.  Wound Description (Comments):   Present on Admission: Yes (present on transfer to room 1402)     Pressure Injury 12/10/20 Heel Left;Posterior Stage 2 -  Partial thickness loss of dermis presenting as a shallow open injury with a red, pink wound bed without slough. (Active)  12/10/20 1325  Location: Heel  Location Orientation: Left;Posterior  Staging: Stage 2 -  Partial thickness loss of dermis presenting as a shallow open injury with a red, pink wound bed without slough.  Wound Description (Comments):   Present on Admission: Yes (upon transfer to 1402)   Wound care consulted and recommendations given Patient currently getting hydrotherapy from PT.    Moderate protein calorie malnutrition Dietary on and supplementations added.   Anemia of chronic disease:  Hemoglobin is stable around 8.2    DVT prophylaxis: SCDs Code Status: Full code Family Communication: (None at bedside Disposition:   Status is: Inpatient  Remains inpatient appropriate because:Unsafe d/c plan and Inpatient level of care appropriate due to severity of illness   Dispo: The patient is from: SNF              Anticipated d/c is to: LTAC              Anticipated d/c date is: > 3 days              Patient currently is medically stable to d/c.       Consultants:   Wound care  Procedures: None Antimicrobials: None  Subjective: No new complaints.  Objective: Vitals:   12/17/20 2017 12/18/20 0421 12/18/20 1243 12/18/20 1300  BP: 129/75 135/64 (!) 101/56   Pulse: 82 72 81   Resp: 18 16    Temp: 98.2 F (36.8 C) 97.8 F (36.6  C) (!) 97.4 F (36.3 C)   TempSrc:  Oral Oral   SpO2: 100% 100% 100%   Weight:    78 kg  Height:        Intake/Output Summary (Last 24 hours) at 12/18/2020 1754 Last data filed at 12/18/2020 1400 Gross per 24 hour  Intake 720 ml  Output 3225 ml  Net -2505 ml   Filed Weights   12/11/20 0424 12/13/20 0619 12/18/20 1300  Weight: 58 kg 60 kg 78 kg    Examination:  General exam: Elderly lady without any distress. Respiratory system: Air entry fair bilateral, no wheezing or rhonchi Cardiovascular system: S1-S2 heard, regular rate rhythm, no JVD Gastrointestinal system: A abdomen is soft, nontender, bowel sounds normal Central nervous system: Alert and answering questions appropriately Extremities: Right BKA Skin: Sacral decubitus ulcer unstageable psychiatry: Mood is appropriate   Data Reviewed: I have personally reviewed following labs and imaging studies  CBC: Recent Labs  Lab 12/12/20 1056 12/14/20 0545 12/15/20 0502 12/16/20 0510 12/17/20 0602  WBC 4.3 3.3* 3.6*  --  3.4*  HGB 7.3* 6.3* 8.1*  8.6* 8.2*  HCT 23.4* 20.2* 25.4* 28.0* 26.5*  MCV 108.8* 109.2* 104.1*  --  107.7*  PLT 127* 108* 102*  --  114*    Basic Metabolic Panel: Recent Labs  Lab 12/12/20 1056 12/14/20 0545 12/17/20 0602  NA 135 137 139  K 4.5 4.2 4.9  CL 101 104 109  CO2 25 22 21*  GLUCOSE 176* 261* 132*  BUN 61* 60* 60*  CREATININE 1.19* 0.98 1.17*  CALCIUM 8.6* 8.3* 8.9  MG 1.7  --   --   PHOS 2.2* 3.1  --     GFR: Estimated Creatinine Clearance: 49.9 mL/min (A) (by C-G formula based on SCr of 1.17 mg/dL (H)).  Liver Function Tests: No results for input(s): AST, ALT, ALKPHOS, BILITOT, PROT, ALBUMIN in the last 168 hours.  CBG: Recent Labs  Lab 12/18/20 0402 12/18/20 0607 12/18/20 0744 12/18/20 1231 12/18/20 1633  GLUCAP 167* 134* 81 144* 256*     No results found for this or any previous visit (from the past 240 hour(s)).       Radiology Studies: No results  found.      Scheduled Meds: . azaTHIOprine  50 mg Oral Daily  . Chlorhexidine Gluconate Cloth  6 each Topical Daily  . collagenase   Topical Daily  . cycloSPORINE  75 mg Oral BID  . feeding supplement  237 mL Oral BID BM  . folic acid  1 mg Oral Daily  . [START ON 12/19/2020] insulin aspart  0-9 Units Subcutaneous TID WC  . [START ON 12/19/2020] insulin aspart  2 Units Subcutaneous TID WC  . insulin glargine  8 Units Subcutaneous BID  . levothyroxine  50 mcg Oral Q0600  . mouth rinse  15 mL Mouth Rinse BID  . metoprolol tartrate  12.5 mg Oral BID  . multivitamin with minerals  1 tablet Oral Daily  . nutrition supplement (JUVEN)  1 packet Oral BID BM  . nystatin   Topical TID  . pantoprazole  40 mg Oral BID  . predniSONE  5 mg Oral Q breakfast  . rosuvastatin  5 mg Oral QHS  . sertraline  25 mg Oral Daily  . sodium bicarbonate  650 mg Oral BID  . vitamin B-12  100 mcg Oral Daily   Continuous Infusions:   LOS: 11 days        Hosie Poisson, MD [facility]   To contact the attending provider between 7A-7P or the covering provider during after hours 7P-7A, please log into the web site [email or web address] and access using universal [place] password for that web site. If you do not have the password, please call the hospital operator.  12/18/2020, 5:54 PM

## 2020-12-19 LAB — CBC
HCT: 26.2 % — ABNORMAL LOW (ref 36.0–46.0)
Hemoglobin: 8.2 g/dL — ABNORMAL LOW (ref 12.0–15.0)
MCH: 34.5 pg — ABNORMAL HIGH (ref 26.0–34.0)
MCHC: 31.3 g/dL (ref 30.0–36.0)
MCV: 110.1 fL — ABNORMAL HIGH (ref 80.0–100.0)
Platelets: 145 10*3/uL — ABNORMAL LOW (ref 150–400)
RBC: 2.38 MIL/uL — ABNORMAL LOW (ref 3.87–5.11)
RDW: 23.1 % — ABNORMAL HIGH (ref 11.5–15.5)
WBC: 2.9 10*3/uL — ABNORMAL LOW (ref 4.0–10.5)
nRBC: 0.7 % — ABNORMAL HIGH (ref 0.0–0.2)

## 2020-12-19 LAB — BASIC METABOLIC PANEL
Anion gap: 8 (ref 5–15)
BUN: 65 mg/dL — ABNORMAL HIGH (ref 8–23)
CO2: 23 mmol/L (ref 22–32)
Calcium: 9.2 mg/dL (ref 8.9–10.3)
Chloride: 107 mmol/L (ref 98–111)
Creatinine, Ser: 0.91 mg/dL (ref 0.44–1.00)
GFR, Estimated: >60 mL/min (ref >60)
Glucose, Bld: 248 mg/dL — ABNORMAL HIGH (ref 70–99)
Potassium: 5 mmol/L (ref 3.5–5.1)
Sodium: 138 mmol/L (ref 135–145)

## 2020-12-19 LAB — GLUCOSE, CAPILLARY
Glucose-Capillary: 140 mg/dL — ABNORMAL HIGH (ref 70–99)
Glucose-Capillary: 185 mg/dL — ABNORMAL HIGH (ref 70–99)
Glucose-Capillary: 190 mg/dL — ABNORMAL HIGH (ref 70–99)
Glucose-Capillary: 204 mg/dL — ABNORMAL HIGH (ref 70–99)
Glucose-Capillary: 290 mg/dL — ABNORMAL HIGH (ref 70–99)

## 2020-12-19 NOTE — Progress Notes (Signed)
PROGRESS NOTE    [name]  YQI:[id number] DOB: Jun 11, 1961 DOA: 12/07/2020 PCP: [name], MD    Chief Complaint  Patient presents with  . Altered Mental Status    Patient here via ems from facility with c/o AMS with yellow drainage from sacral ulcer with odor.    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 Patient was then admitted to the hospital for further evaluation and treatment. Patient completed a 7 days of antibiotics and currently waiting for LTAC placement.   Assessment & Plan:   Active Problems:   Anemia secondary to renal failure   History of simultaneous kidney and pancreas transplant ([place])   Chronic kidney disease (CKD) stage G3a/A1, moderately decreased glomerular filtration rate (GFR) between 45-59 mL/min/1.73 square meter and albuminuria creatinine ratio less than 30 mg/g (HCC)   Hypertension   Acute kidney injury superimposed on CKD (HCC)   Type 1 diabetes mellitus with complication, with long term current use of insulin pump (HCC)   Pressure injury of skin   Long-term use of immunosuppressant medication   Sepsis ([place])   Hypoalbuminemia   Hyponatremia   CAP (community acquired pneumonia)   Acute urinary retention   Acute metabolic encephalopathy   Sacral decubitus ulcer, stage III (HCC)   Cellulitis   Hypotension   Malodorous urine   Erosive esophagitis   Hypothyroid   Depression   Type 1 diabetes mellitus with hyperlipidemia (HCC)    Malnutrition of moderate degree   Acute metabolic encephalopathy secondary to infected sacral ulcer next appears to have resolved Completed 7 days of antibiotics.  Patient is alert and answering all questions appropriately    Cellulitis of the unstageable sacral decubitus ulcer CT of the pelvis just not show any osteomyelitis.  She completed 7 days of antibiotics at this time and currently getting hydrotherapy by PT  Acute urinary retention with bacteriuria Probably colonization from yeast.    AKI on stage IIIa CKD s/p renal transplant Baseline creatinine around 1.2-1.3. Continue with Imuran, prednisone and cyclosporine. No changes in meds.     Type I diabetic s/p pancreas transplant, poorly controlled with episodes of hypoglycemia next  Hemoglobin A1c at 7.2 CBG (last 3)  Recent Labs    12/19/20 0616 12/19/20 0742 12/19/20 1112  GLUCAP 204* 185* 140*   Currently elevated CBGs with hyperglycemia . Added 2 units tidac if eating more than 50% of meals.     S/p BKA for subacute osteomyelitis    History of erosive esophagitis Stable continue with PPI twice daily.   Hypothyroidism Continue with Synthroid.   Hyperlipidemia Continue with Crestor  Thrombocytopenia Platelets 114000. Repeat in am.   Depression Continue with Zoloft.   Hypophosphatemia Replaced  Pressure injury present on admission Pressure Injury 09/27/20 Sacrum Medial Unstageable - Full thickness tissue loss in which the base of the injury is covered by slough (yellow, tan, gray, green or brown) and/or eschar (tan, brown or black) in the wound bed. (Active)  09/27/20 1534  Location: Sacrum  Location Orientation: Medial  Staging: Unstageable - Full thickness tissue  loss in which the base of the injury is covered by slough (yellow, tan, gray, green or brown) and/or eschar (tan, brown or black) in the wound bed.  Wound Description (Comments):   Present on Admission: Yes     Pressure Injury  12/10/20 Hip Left;Posterior;Lateral Stage 2 -  Partial thickness loss of dermis presenting as a shallow open injury with a red, pink wound bed without slough. (Active)  12/10/20 1325  Location: Hip  Location Orientation: Left;Posterior;Lateral  Staging: Stage 2 -  Partial thickness loss of dermis presenting as a shallow open injury with a red, pink wound bed without slough.  Wound Description (Comments):   Present on Admission: Yes (present on transfer to room 1402)     Pressure Injury 12/10/20 Heel Left;Posterior Stage 2 -  Partial thickness loss of dermis presenting as a shallow open injury with a red, pink wound bed without slough. (Active)  12/10/20 1325  Location: Heel  Location Orientation: Left;Posterior  Staging: Stage 2 -  Partial thickness loss of dermis presenting as a shallow open injury with a red, pink wound bed without slough.  Wound Description (Comments):   Present on Admission: Yes (upon transfer to 1402)   Wound care consulted and recommendations given Patient currently getting hydrotherapy from PT.    Moderate protein calorie malnutrition Dietary on and supplementations added.   Anemia of chronic disease:  Hemoglobin is stable around 8.2    DVT prophylaxis: SCDs Code Status: Full code Family Communication: (None at bedside Disposition:   Status is: Inpatient  Remains inpatient appropriate because:Unsafe d/c plan and Inpatient level of care appropriate due to severity of illness   Dispo: The patient is from: SNF              Anticipated d/c is to: LTAC              Anticipated d/c date is: > 3 days              Patient currently is medically stable to d/c.       Consultants:   Wound care  Procedures: None Antimicrobials: None  Subjective: No new complaints.  Objective: Vitals:   12/18/20 2223 12/19/20 0500 12/19/20 0618 12/19/20 1246  BP: 137/67  118/68 (!) 113/57  Pulse: 77  71 72  Resp: 16  18 14   Temp: 99 F (37.2 C)  97.9 F  (36.6 C) 98.1 F (36.7 C)  TempSrc: Oral   Oral  SpO2: 100%  97% 99%  Weight:  77 kg    Height:        Intake/Output Summary (Last 24 hours) at 12/19/2020 1630 Last data filed at 12/19/2020 0940 Gross per 24 hour  Intake 582 ml  Output 1740 ml  Net -1158 ml   Filed Weights   12/13/20 0619 12/18/20 1300 12/19/20 0500  Weight: 60 kg 78 kg 77 kg    Examination:  General exam: Elderly lady without any distress. Respiratory system: Air entry fair bilateral, no wheezing or rhonchi Cardiovascular system: S1-S2 heard, regular rate rhythm, no JVD Gastrointestinal system: A abdomen is soft, nontender, bowel sounds normal Central nervous system: Alert and answering questions appropriately Extremities: Right BKA Skin: Sacral decubitus ulcer unstageable psychiatry: Mood is appropriate   Data Reviewed: I have personally reviewed following labs and imaging studies  CBC: Recent Labs  Lab 12/14/20 0545 12/15/20 0502 12/16/20 0510 12/17/20 0602 12/19/20 0557  WBC 3.3* 3.6*  --  3.4* 2.9*  HGB 6.3* 8.1* 8.6* 8.2*  8.2*  HCT 20.2* 25.4* 28.0* 26.5* 26.2*  MCV 109.2* 104.1*  --  107.7* 110.1*  PLT 108* 102*  --  114* 145*    Basic Metabolic Panel: Recent Labs  Lab 12/14/20 0545 12/17/20 0602 12/19/20 0557  NA 137 139 138  K 4.2 4.9 5.0  CL 104 109 107  CO2 22 21* 23  GLUCOSE 261* 132* 248*  BUN 60* 60* 65*  CREATININE 0.98 1.17* 0.91  CALCIUM 8.3* 8.9 9.2  PHOS 3.1  --   --     GFR: Estimated Creatinine Clearance: 63.7 mL/min (by C-G formula based on SCr of 0.91 mg/dL).  Liver Function Tests: No results for input(s): AST, ALT, ALKPHOS, BILITOT, PROT, ALBUMIN in the last 168 hours.  CBG: Recent Labs  Lab 12/18/20 2220 12/19/20 0029 12/19/20 0616 12/19/20 0742 12/19/20 1112  GLUCAP 270* 290* 204* 185* 140*     No results found for this or any previous visit (from the past 240 hour(s)).       Radiology Studies: No results found.      Scheduled  Meds: . azaTHIOprine  50 mg Oral Daily  . Chlorhexidine Gluconate Cloth  6 each Topical Daily  . collagenase   Topical Daily  . cycloSPORINE  75 mg Oral BID  . feeding supplement  237 mL Oral BID BM  . folic acid  1 mg Oral Daily  . insulin aspart  0-9 Units Subcutaneous TID WC  . insulin aspart  2 Units Subcutaneous TID WC  . insulin glargine  8 Units Subcutaneous BID  . levothyroxine  50 mcg Oral Q0600  . mouth rinse  15 mL Mouth Rinse BID  . metoprolol tartrate  12.5 mg Oral BID  . multivitamin with minerals  1 tablet Oral Daily  . nutrition supplement (JUVEN)  1 packet Oral BID BM  . nystatin   Topical TID  . pantoprazole  40 mg Oral BID  . predniSONE  5 mg Oral Q breakfast  . rosuvastatin  5 mg Oral QHS  . sertraline  25 mg Oral Daily  . sodium bicarbonate  650 mg Oral BID  . vitamin B-12  100 mcg Oral Daily   Continuous Infusions:   LOS: 12 days        Hosie Poisson, MD [facility]   To contact the attending provider between 7A-7P or the covering provider during after hours 7P-7A, please log into the web site [email or web address] and access using universal [place] password for that web site. If you do not have the password, please call the hospital operator.  12/19/2020, 4:30 PM

## 2020-12-19 NOTE — Progress Notes (Signed)
Physical Therapy Hydrotherapy Treatment Note  Discussed dressing selections and need to increase frequency of dressing changes with RN since pt with continued periwound maceration.  RN to check with [place] RN for updating orders.    12/19/20 1300  Subjective Assessment  Subjective Pt happy and pleasant.  Ready for session  Patient and Family Stated Goals agreed to wound care  Date of Onset  (present on admission)  Prior Treatments PLS/hydrotherapy with PT last admission  Evaluation and Treatment  Evaluation and Treatment Procedures Explained to Patient/Family Yes  Evaluation and Treatment Procedures agreed to  Wound / Incision (Open or Dehisced) 12/09/20 Other (Comment) Sacrum Medial open nonstageable sacral wound, malodorous with slough.  Date First Assessed: 12/09/20   Wound Type: Other (Comment)  Location: Sacrum  Location Orientation: Medial  Wound Description (Comments): open nonstageable sacral wound, malodorous with slough.  Dressing Type Gauze (Comment);Foam - Lift dressing to assess site every shift;Other (Comment) (santyl, Aquacel Ag Advantage hydrofiber)  Dressing Changed Changed  Dressing Status Old drainage  Dressing Change Frequency  (requesting at least twice a day due to drainage - RN notified)  Site / Wound Assessment Bleeding;Yellow;Pink;Red;Painful  % Wound base Red or Granulating 70%  % Wound base Yellow/Fibrinous Exudate 30%  Peri-wound Assessment Erythema (non-blanchable);Maceration;Pink  Margins Unattached edges (unapproximated)  Drainage Amount Copious  Drainage Description Purulent;Serosanguineous  Treatment Debridement (Selective);Hydrotherapy (Pulse lavage);Packing (Saline gauze) (santyl)  Hydrotherapy  Pulsed Lavage with Suction (psi) 8 psi  Pulsed Lavage with Suction - Normal Saline Used 1000 mL  Pulsed Lavage Tip Tip with splash shield  Pulsed lavage therapy - wound location sacrum  Selective Debridement  Selective Debridement - Location sacrum   Selective Debridement - Tools Used Forceps;Scissors  Selective Debridement - Tissue Removed very small amount of slough  Wound Therapy - Assess/Plan/Recommendations  Wound Therapy - Clinical Statement wound continues to have stringy adhered slough. Pt with periwound maceration so placed hydrofiber to absorb more exudate.  Requested RN update WOC RN about possibly changing dressing orders (including frequency).  Pt with occasional pain however tolerable and agreeable to continue.  Will continue hydrotherapy/PLS/Santyl while pt remains hospitalized.  Wound Therapy - Functional Problem List bed bound  Factors Delaying/Impairing Wound Healing Incontinence;Multiple medical problems  Hydrotherapy Plan Debridement;Dressing change;Pulsatile lavage with suction  Wound Therapy - Frequency 6X / week  Wound Therapy - Current Recommendations Case manager/social work  Wound Therapy - Follow Up Recommendations Skilled nursing facility [facility])  Wound Plan Continue hydrotherapy/PLS, enzymatic debridement, and dressing changes  Wound Therapy Goals - Improve the function of patient's integumentary system by progressing the wound(s) through the phases of wound healing by:  Decrease Necrotic Tissue to 25  Decrease Necrotic Tissue - Progress Progressing toward goal  Increase Granulation Tissue to 75  Increase Granulation Tissue - Progress Progressing toward goal  Improve Drainage Characteristics Min  Improve Drainage Characteristics - Progress Progressing toward goal  Goals/treatment plan/discharge plan were made with and agreed upon by patient/family Yes  Time For Goal Achievement 2 weeks  Wound Therapy - Potential for Goals Fair   Time: [place], DPT Acute Rehabilitation Services Pager: [phone] Office: [phone]

## 2020-12-19 NOTE — TOC Progression Note (Signed)
Transition of Care [facility]) - Progression Note    Patient Details  Name: [name] MRN: [id number] Date of Birth: 11-20-1961  [facility] CM/SW Contact  Seretha Estabrooks, Juliann Pulse, RN Phone Number: 12/19/2020, 11:21 AM  Clinical Narrative:TC [facility] rep spoke to [place] awaiting expidited appeal outcome from insurance(closed today).       Expected Discharge Plan: Long Term Acute Care (LTAC) Barriers to Discharge: Insurance Authorization  Expected Discharge Plan and Services Expected Discharge Plan: Long Term Acute Care (LTAC)   Discharge Planning Services: CM Consult Post Acute Care Choice: [place] Living arrangements for the past 2 months: Single Family Home                                       Social Determinants of Health (SDOH) Interventions    Readmission Risk Interventions No flowsheet data found.

## 2020-12-20 LAB — GLUCOSE, CAPILLARY
Glucose-Capillary: 120 mg/dL — ABNORMAL HIGH (ref 70–99)
Glucose-Capillary: 154 mg/dL — ABNORMAL HIGH (ref 70–99)
Glucose-Capillary: 156 mg/dL — ABNORMAL HIGH (ref 70–99)

## 2020-12-20 NOTE — Progress Notes (Signed)
PHYSICAL THERAPY HYDROTHERAPY   12/20/20   Subjective Assessment  Subjective Pt happy and pleasant.  Ready for session  Patient and Family Stated Goals agreed to wound care  Date of Onset  (present on admission)  Prior Treatments PLS/hydrotherapy with PT last admission  Evaluation and Treatment  Evaluation and Treatment Procedures Explained to Patient/Family Yes  Evaluation and Treatment Procedures agreed to  Wound / Incision (Open or Dehisced) 12/09/20 Other (Comment) Sacrum Medial open nonstageable sacral wound, malodorous with slough.  Date First Assessed: 12/09/20   Wound Type: Other (Comment)  Location: Sacrum  Location Orientation: Medial  Wound Description (Comments): open nonstageable sacral wound, malodorous with slough.  Dressing Type Gauze (Comment)  Dressing Changed Changed  Dressing Status Old drainage  Site / Wound Assessment Bleeding  % Wound base Red or Granulating 70%  % Wound base Yellow/Fibrinous Exudate 30%  Peri-wound Assessment Erythema (blanchable);Maceration;Pink  Margins Unattached edges (unapproximated)  Drainage Amount Copious  Drainage Description Purulent;Serous  Treatment Debridement (Selective);Hydrotherapy (Pulse lavage)  Hydrotherapy  Pulsed Lavage with Suction (psi) 8 psi  Pulsed Lavage with Suction - Normal Saline Used 1000 mL  Pulsed Lavage Tip Tip with splash shield  Pulsed lavage therapy - wound location sacrum  Selective Debridement  Selective Debridement - Location sacrum  Selective Debridement - Tools Used Forceps;Scissors  Selective Debridement - Tissue Removed very small amount of slough  Wound Therapy - Assess/Plan/Recommendations  Wound Therapy - Clinical Statement wound continues to have stringy adhered slough. Pt with periwound maceration so placed hydrofiber to absorb more exudate. Pt with occasional pain however tolerable and agreeable to continue.  Will continue hydrotherapy/PLS/Santyl while pt remains hospitalized.  Wound Therapy  - Functional Problem List bed bound  Factors Delaying/Impairing Wound Healing Incontinence;Multiple medical problems  Hydrotherapy Plan Debridement;Dressing change;Pulsatile lavage with suction  Wound Therapy - Frequency 6X / week  Wound Therapy - Current Recommendations Case manager/social work  Wound Therapy - Follow Up Recommendations Skilled nursing facility [facility])  Wound Plan Continue hydrotherapy/PLS, enzymatic debridement, and dressing changes  Wound Therapy Goals - Improve the function of patient's integumentary system by progressing the wound(s) through the phases of wound healing by:  Decrease Necrotic Tissue to 25  Increase Granulation Tissue to 75  Improve Drainage Characteristics Min  Goals/treatment plan/discharge plan were made with and agreed upon by patient/family Yes  Time For Goal Achievement 2 weeks  Wound Therapy - Potential for Goals Fair   RN came in during to view.  Area red, moist with edges macerated.  Slight bleeding during irrigation. Positioned RIGHT sidelying on completion to comfort.  Encourage side to side positioning unless for meals.    Rica Koyanagi  PTA Acute  Rehabilitation Services Pager      [phone] Office      [phone]

## 2020-12-20 NOTE — Progress Notes (Signed)
PROGRESS NOTE    [name]  DXA:[id number] DOB: [birth year] DOA: 12/07/2020 PCP: [name], MD    Chief Complaint  Patient presents with  . Altered Mental Status    Patient here via ems from facility with c/o AMS with yellow drainage from sacral ulcer with odor.    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 Patient was then admitted to the hospital for further evaluation and treatment. Patient completed a 7 days of antibiotics and currently waiting for LTAC placement. Pt seen and examined, no new complaints. PT recommended changing the dressing twice daily.  Pt in good spirits, no new complaints.    Assessment & Plan:   Active Problems:   Anemia secondary to renal failure   History of simultaneous kidney and pancreas transplant ([place])   Chronic kidney disease (CKD) stage G3a/A1, moderately decreased glomerular filtration rate (GFR) between 45-59 mL/min/1.73 square meter and albuminuria creatinine ratio less than 30 mg/g (HCC)   Hypertension   Acute kidney injury superimposed on CKD (HCC)   Type 1 diabetes mellitus with complication, with long term current use of insulin pump (HCC)   Pressure injury of skin   Long-term use of immunosuppressant medication   Sepsis ([place])   Hypoalbuminemia   Hyponatremia   CAP (community acquired pneumonia)   Acute urinary retention   Acute metabolic encephalopathy   Sacral decubitus ulcer, stage III (HCC)   Cellulitis    Hypotension   Malodorous urine   Erosive esophagitis   Hypothyroid   Depression   Type 1 diabetes mellitus with hyperlipidemia (HCC)   Malnutrition of moderate degree   Acute metabolic encephalopathy secondary to infected sacral ulcer next appears to have resolved Completed 7 days of antibiotics.  Patient is alert and answering all questions appropriately No new complaints.     Cellulitis of the unstageable sacral decubitus ulcer CT of the pelvis just not show any osteomyelitis.  She completed 7 days of antibiotics at this time and currently getting hydrotherapy by PT PT recommending changing the dressing twice daily ,    Acute urinary retention with bacteriuria Probably colonization from yeast.  Resolved.    AKI on stage IIIa CKD s/p renal transplant Baseline creatinine around 1.2-1.3. Continue with Imuran, prednisone and cyclosporine. No changes in meds.     Type I diabetic s/p pancreas transplant, poorly controlled with episodes of hypoglycemia next  Hemoglobin A1c at 7.2 CBG (last 3)  Recent Labs    12/20/20 0804 12/20/20 1202 12/20/20 1723  GLUCAP 120* 154* 156*   CBGs are well controlled.  Added 2 units tidac if eating more than 50% of meals.  No changes in meds at this time.     S/p BKA for subacute osteomyelitis    History of erosive esophagitis Stable continue with PPI twice daily.   Hypothyroidism Continue with Synthroid.   Hyperlipidemia Continue with Crestor  Thrombocytopenia Repeat platelet count.   Depression Continue with Zoloft.   Hypophosphatemia Replaced  Pressure injury present on admission Pressure Injury 09/27/20 Sacrum Medial Unstageable - Full thickness tissue loss in  which the base of the injury is covered by slough (yellow, tan, gray, green or brown) and/or eschar (tan, brown or black) in the wound bed. (Active)  09/27/20 1534  Location: Sacrum  Location Orientation: Medial  Staging: Unstageable - Full thickness  tissue loss in which the base of the injury is covered by slough (yellow, tan, gray, green or brown) and/or eschar (tan, brown or black) in the wound bed.  Wound Description (Comments):   Present on Admission: Yes     Pressure Injury 12/10/20 Hip Left;Posterior;Lateral Stage 2 -  Partial thickness loss of dermis presenting as a shallow open injury with a red, pink wound bed without slough. (Active)  12/10/20 1325  Location: Hip  Location Orientation: Left;Posterior;Lateral  Staging: Stage 2 -  Partial thickness loss of dermis presenting as a shallow open injury with a red, pink wound bed without slough.  Wound Description (Comments):   Present on Admission: Yes (present on transfer to room 1402)     Pressure Injury 12/10/20 Heel Left;Posterior Stage 2 -  Partial thickness loss of dermis presenting as a shallow open injury with a red, pink wound bed without slough. (Active)  12/10/20 1325  Location: Heel  Location Orientation: Left;Posterior  Staging: Stage 2 -  Partial thickness loss of dermis presenting as a shallow open injury with a red, pink wound bed without slough.  Wound Description (Comments):   Present on Admission: Yes (upon transfer to 1402)   Wound care consulted and recommendations given Patient currently getting hydrotherapy from PT.    Moderate protein calorie malnutrition Dietary on and supplementations added.   Anemia of chronic disease:  Hemoglobin is stable around 8.2    DVT prophylaxis: SCDs Code Status: Full code Family Communication: (None at bedside Disposition:   Status is: Inpatient  Remains inpatient appropriate because:Unsafe d/c plan and Inpatient level of care appropriate due to severity of illness   Dispo: The patient is from: SNF              Anticipated d/c is to: LTAC              Anticipated d/c date is: > 3 days              Patient currently is medically stable to d/c.       Consultants:   Wound care  Procedures:  None Antimicrobials: None  Subjective: Pt in good spirits. No new complaints.  Objective: Vitals:   12/19/20 1246 12/19/20 2048 12/20/20 0442 12/20/20 1515  BP: (!) 113/57 120/65 135/75 (!) 103/52  Pulse: 72 70 78 72  Resp: 14 16 16 20   Temp: 98.1 F (36.7 C) 98.1 F (36.7 C) 98.8 F (37.1 C) 98.2 F (36.8 C)  TempSrc: Oral   Oral  SpO2: 99% 99% 98% 99%  Weight:   76 kg   Height:        Intake/Output Summary (Last 24 hours) at 12/20/2020 1736 Last data filed at 12/20/2020 1727 Gross per 24 hour  Intake 360 ml  Output 2225 ml  Net -1865 ml   Filed Weights   12/18/20 1300 12/19/20 0500 12/20/20 0442  Weight: 78 kg 77 kg 76 kg    Examination:  General exam: Elderly lady, without any distress.  Respiratory system: clear to auscultation, no wheezing or rhonchi.  Cardiovascular system:  S1s2, RRR, no JVD,  Gastrointestinal system: Abdomen is soft, non tender non distended , bowel sounds.  Central nervous system: Alert and answering questions appropriately.  Extremities: Right BKA Skin: Unstageable sacral decubitus. psychiatry:  Mood is appropriate.    Data Reviewed: I have personally reviewed following labs and imaging studies  CBC: Recent Labs  Lab 12/14/20 0545 12/15/20 0502 12/16/20 0510 12/17/20 0602 12/19/20 0557  WBC 3.3* 3.6*  --  3.4* 2.9*  HGB 6.3* 8.1* 8.6* 8.2* 8.2*  HCT 20.2* 25.4* 28.0* 26.5* 26.2*  MCV 109.2* 104.1*  --  107.7* 110.1*  PLT 108* 102*  --  114* 145*    Basic Metabolic Panel: Recent Labs  Lab 12/14/20 0545 12/17/20 0602 12/19/20 0557  NA 137 139 138  K 4.2 4.9 5.0  CL 104 109 107  CO2 22 21* 23  GLUCOSE 261* 132* 248*  BUN 60* 60* 65*  CREATININE 0.98 1.17* 0.91  CALCIUM 8.3* 8.9 9.2  PHOS 3.1  --   --     GFR: Estimated Creatinine Clearance: 63.3 mL/min (by C-G formula based on SCr of 0.91 mg/dL).  Liver Function Tests: No results for input(s): AST, ALT, ALKPHOS, BILITOT, PROT, ALBUMIN in the last 168  hours.  CBG: Recent Labs  Lab 12/19/20 1112 12/19/20 1623 12/20/20 0804 12/20/20 1202 12/20/20 1723  GLUCAP 140* 190* 120* 154* 156*     No results found for this or any previous visit (from the past 240 hour(s)).       Radiology Studies: No results found.      Scheduled Meds: . azaTHIOprine  50 mg Oral Daily  . Chlorhexidine Gluconate Cloth  6 each Topical Daily  . cycloSPORINE  75 mg Oral BID  . feeding supplement  237 mL Oral BID BM  . folic acid  1 mg Oral Daily  . insulin aspart  0-9 Units Subcutaneous TID WC  . insulin aspart  2 Units Subcutaneous TID WC  . insulin glargine  8 Units Subcutaneous BID  . levothyroxine  50 mcg Oral Q0600  . mouth rinse  15 mL Mouth Rinse BID  . metoprolol tartrate  12.5 mg Oral BID  . multivitamin with minerals  1 tablet Oral Daily  . nutrition supplement (JUVEN)  1 packet Oral BID BM  . nystatin   Topical TID  . pantoprazole  40 mg Oral BID  . predniSONE  5 mg Oral Q breakfast  . rosuvastatin  5 mg Oral QHS  . sertraline  25 mg Oral Daily  . sodium bicarbonate  650 mg Oral BID  . vitamin B-12  100 mcg Oral Daily   Continuous Infusions:   LOS: 13 days        Hosie Poisson, MD [facility]   To contact the attending provider between 7A-7P or the covering provider during after hours 7P-7A, please log into the web site [email or web address] and access using universal [place] password for that web site. If you do not have the password, please call the hospital operator.  12/20/2020, 5:36 PM

## 2020-12-21 LAB — GLUCOSE, CAPILLARY
Glucose-Capillary: 214 mg/dL — ABNORMAL HIGH (ref 70–99)
Glucose-Capillary: 165 mg/dL — ABNORMAL HIGH (ref 70–99)
Glucose-Capillary: 218 mg/dL — ABNORMAL HIGH (ref 70–99)

## 2020-12-21 NOTE — Progress Notes (Signed)
PROGRESS NOTE    [name]  LAG:[id number] DOB: 08-Mar-1961 DOA: 12/07/2020 PCP: [name], MD    Chief Complaint  Patient presents with  . Altered Mental Status    Patient here via ems from facility with c/o AMS with yellow drainage from sacral ulcer with odor.    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 Patient was then admitted to the hospital for further evaluation and treatment. Patient completed a 7 days of antibiotics and currently waiting for LTAC placement. Pt seen and examined, no new complaints. PT recommended changing the dressing twice daily.   Assessment & Plan:   Active Problems:   Anemia secondary to renal failure   History of simultaneous kidney and pancreas transplant ([place])   Chronic kidney disease (CKD) stage G3a/A1, moderately decreased glomerular filtration rate (GFR) between 45-59 mL/min/1.73 square meter and albuminuria creatinine ratio less than 30 mg/g (HCC)   Hypertension   Acute kidney injury superimposed on CKD (HCC)   Type 1 diabetes mellitus with complication, with long term current use of insulin pump (HCC)   Pressure injury of skin   Long-term use of immunosuppressant medication   Sepsis ([place])   Hypoalbuminemia   Hyponatremia   CAP (community acquired pneumonia)   Acute urinary retention   Acute metabolic encephalopathy   Sacral decubitus ulcer, stage III (HCC)   Cellulitis   Hypotension   Malodorous urine   Erosive  esophagitis   Hypothyroid   Depression   Type 1 diabetes mellitus with hyperlipidemia (HCC)   Malnutrition of moderate degree   Acute metabolic encephalopathy secondary to infected sacral ulcer next appears to have resolved Completed 7 days of antibiotics.  Patient alert and oriented and answering questions appropriately    Cellulitis of the unstageable sacral decubitus ulcer CT of the pelvis just not show any osteomyelitis.  She completed 7 days of antibiotics at this time and currently getting hydrotherapy by PT PT recommending changing the dressing twice daily ,    Acute urinary retention with bacteriuria Probably colonization from yeast.  Resolved.    AKI on stage IIIa CKD s/p renal transplant Baseline creatinine around 1.2-1.3. Continue with Imuran, prednisone and cyclosporine. No changes in meds.     Type I diabetic s/p pancreas transplant, poorly controlled with episodes of hypoglycemia next  Hemoglobin A1c at 7.2 CBG (last 3)  Recent Labs    12/20/20 1723 12/21/20 0741 12/21/20 1207  GLUCAP 156* 214* 165*   CBGs are well controlled.  Added 2 units tidac if eating more than 50% of meals.  No changes in meds at this time.     S/p BKA for subacute osteomyelitis    History of erosive esophagitis Stable continue with PPI twice daily.   Hypothyroidism Continue with Synthroid.   Hyperlipidemia Continue with Crestor  Thrombocytopenia Repeat platelet count.   Depression Continue with Zoloft.   Hypophosphatemia Replaced  Pressure injury present on admission Pressure Injury 09/27/20 Sacrum Medial Unstageable - Full thickness tissue loss in which the base of the injury is covered by slough (yellow, tan, gray,  green or brown) and/or eschar (tan, brown or black) in the wound bed. (Active)  09/27/20 1534  Location: Sacrum  Location Orientation: Medial  Staging: Unstageable - Full thickness tissue loss in which the base of the injury is covered by  slough (yellow, tan, gray, green or brown) and/or eschar (tan, brown or black) in the wound bed.  Wound Description (Comments):   Present on Admission: Yes     Pressure Injury 12/10/20 Hip Left;Posterior;Lateral Stage 2 -  Partial thickness loss of dermis presenting as a shallow open injury with a red, pink wound bed without slough. (Active)  12/10/20 1325  Location: Hip  Location Orientation: Left;Posterior;Lateral  Staging: Stage 2 -  Partial thickness loss of dermis presenting as a shallow open injury with a red, pink wound bed without slough.  Wound Description (Comments):   Present on Admission: Yes (present on transfer to room 1402)     Pressure Injury 12/10/20 Heel Left;Posterior Stage 2 -  Partial thickness loss of dermis presenting as a shallow open injury with a red, pink wound bed without slough. (Active)  12/10/20 1325  Location: Heel  Location Orientation: Left;Posterior  Staging: Stage 2 -  Partial thickness loss of dermis presenting as a shallow open injury with a red, pink wound bed without slough.  Wound Description (Comments):   Present on Admission: Yes (upon transfer to 1402)   Wound care consulted and recommendations given Patient currently getting hydrotherapy from PT.    Moderate protein calorie malnutrition Dietary on and supplementations added.   Anemia of chronic disease:  Hemoglobin is stable around 8.2. Repeat Labs in am.    DVT prophylaxis: SCDs Code Status: Full code Family Communication: (None at bedside Disposition:   Status is: Inpatient  Remains inpatient appropriate because:Unsafe d/c plan and Inpatient level of care appropriate due to severity of illness   Dispo: The patient is from: SNF              Anticipated d/c is to: LTAC              Anticipated d/c date is: > 3 days              Patient currently is medically stable to d/c.       Consultants:   Wound care  Procedures: None Antimicrobials: None  Subjective: No new  complaints.  Objective: Vitals:   12/20/20 2109 12/21/20 0500 12/21/20 0504 12/21/20 1343  BP: 128/74  136/78 114/67  Pulse: 77  75 74  Resp:   17 20  Temp: 98.1 F (36.7 C)  98 F (36.7 C) 98.6 F (37 C)  TempSrc:   Oral Oral  SpO2: 98%  96% 99%  Weight:  77 kg    Height:        Intake/Output Summary (Last 24 hours) at 12/21/2020 1609 Last data filed at 12/21/2020 1343 Gross per 24 hour  Intake 840 ml  Output 2125 ml  Net -1285 ml   Filed Weights   12/19/20 0500 12/20/20 0442 12/21/20 0500  Weight: 77 kg 76 kg 77 kg    Examination:  General exam: elderly lady  Not in distress.  Respiratory system: clear to auscultation, no wheezing. Or rhonchi. .  Cardiovascular system:  S1s2, RRR, No JVD. Gastrointestinal system: Abdomen is soft, NT ND BS+  Central nervous system: Alert and comfortable.  Extremities: Right BKA Skin: Unstageable sacral decubitus. psychiatry:  Mood is appropriate.    Data Reviewed: I have personally reviewed following labs  and imaging studies  CBC: Recent Labs  Lab 12/15/20 0502 12/16/20 0510 12/17/20 0602 12/19/20 0557  WBC 3.6*  --  3.4* 2.9*  HGB 8.1* 8.6* 8.2* 8.2*  HCT 25.4* 28.0* 26.5* 26.2*  MCV 104.1*  --  107.7* 110.1*  PLT 102*  --  114* 145*    Basic Metabolic Panel: Recent Labs  Lab 12/17/20 0602 12/19/20 0557  NA 139 138  K 4.9 5.0  CL 109 107  CO2 21* 23  GLUCOSE 132* 248*  BUN 60* 65*  CREATININE 1.17* 0.91  CALCIUM 8.9 9.2    GFR: Estimated Creatinine Clearance: 63.7 mL/min (by C-G formula based on SCr of 0.91 mg/dL).  Liver Function Tests: No results for input(s): AST, ALT, ALKPHOS, BILITOT, PROT, ALBUMIN in the last 168 hours.  CBG: Recent Labs  Lab 12/20/20 0804 12/20/20 1202 12/20/20 1723 12/21/20 0741 12/21/20 1207  GLUCAP 120* 154* 156* 214* 165*     No results found for this or any previous visit (from the past 240 hour(s)).       Radiology Studies: No results  found.      Scheduled Meds: . azaTHIOprine  50 mg Oral Daily  . Chlorhexidine Gluconate Cloth  6 each Topical Daily  . cycloSPORINE  75 mg Oral BID  . feeding supplement  237 mL Oral BID BM  . folic acid  1 mg Oral Daily  . insulin aspart  0-9 Units Subcutaneous TID WC  . insulin aspart  2 Units Subcutaneous TID WC  . insulin glargine  8 Units Subcutaneous BID  . levothyroxine  50 mcg Oral Q0600  . mouth rinse  15 mL Mouth Rinse BID  . metoprolol tartrate  12.5 mg Oral BID  . multivitamin with minerals  1 tablet Oral Daily  . nutrition supplement (JUVEN)  1 packet Oral BID BM  . nystatin   Topical TID  . pantoprazole  40 mg Oral BID  . predniSONE  5 mg Oral Q breakfast  . rosuvastatin  5 mg Oral QHS  . sertraline  25 mg Oral Daily  . sodium bicarbonate  650 mg Oral BID  . vitamin B-12  100 mcg Oral Daily   Continuous Infusions:   LOS: 14 days        Hosie Poisson, MD [facility]   To contact the attending provider between 7A-7P or the covering provider during after hours 7P-7A, please log into the web site [email or web address] and access using universal [place] password for that web site. If you do not have the password, please call the hospital operator.  12/21/2020, 4:09 PM

## 2020-12-21 NOTE — Plan of Care (Signed)
  Problem: Health Behavior/Discharge Planning: Goal: Ability to manage health-related needs will improve Outcome: Progressing   Problem: Clinical Measurements: Goal: Cardiovascular complication will be avoided 12/21/2020 0124 by Baker Pierini, RN Outcome: Progressing 12/21/2020 0122 by Baker Pierini, RN Outcome: Progressing   Problem: Nutrition: Goal: Adequate nutrition will be maintained 12/21/2020 0124 by Baker Pierini, RN Outcome: Progressing 12/21/2020 0122 by Baker Pierini, RN Outcome: Progressing   Problem: Coping: Goal: Level of anxiety will decrease 12/21/2020 0124 by Baker Pierini, RN Outcome: Progressing 12/21/2020 0122 by Baker Pierini, RN Outcome: Progressing   Problem: Elimination: Goal: Will not experience complications related to urinary retention 12/21/2020 0124 by Baker Pierini, RN Outcome: Progressing 12/21/2020 0122 by Baker Pierini, RN Outcome: Progressing   Problem: Safety: Goal: Ability to remain free from injury will improve Outcome: Progressing   Problem: Skin Integrity: Goal: Risk for impaired skin integrity will decrease Outcome: Progressing   Problem: Education: Goal: Knowledge of the prescribed therapeutic regimen will improve Outcome: Progressing   Problem: Clinical Measurements: Goal: Postoperative complications will be avoided or minimized Outcome: Progressing   Problem: Pain Management: Goal: Pain level will decrease with appropriate interventions Outcome: Progressing   Problem: Skin Integrity: Goal: Demonstration of wound healing without infection will improve Outcome: Progressing

## 2020-12-22 DIAGNOSIS — D631 Anemia in chronic kidney disease: Secondary | ICD-10-CM | POA: Diagnosis not present

## 2020-12-22 DIAGNOSIS — G9341 Metabolic encephalopathy: Secondary | ICD-10-CM | POA: Diagnosis not present

## 2020-12-22 DIAGNOSIS — N189 Chronic kidney disease, unspecified: Secondary | ICD-10-CM | POA: Diagnosis not present

## 2020-12-22 LAB — CBC
HCT: 25.6 % — ABNORMAL LOW (ref 36.0–46.0)
Hemoglobin: 8 g/dL — ABNORMAL LOW (ref 12.0–15.0)
MCH: 34.3 pg — ABNORMAL HIGH (ref 26.0–34.0)
MCHC: 31.3 g/dL (ref 30.0–36.0)
MCV: 109.9 fL — ABNORMAL HIGH (ref 80.0–100.0)
Platelets: 214 10*3/uL (ref 150–400)
RBC: 2.33 MIL/uL — ABNORMAL LOW (ref 3.87–5.11)
RDW: 24.1 % — ABNORMAL HIGH (ref 11.5–15.5)
WBC: 3.5 10*3/uL — ABNORMAL LOW (ref 4.0–10.5)
nRBC: 1.4 % — ABNORMAL HIGH (ref 0.0–0.2)

## 2020-12-22 LAB — GLUCOSE, CAPILLARY
Glucose-Capillary: 250 mg/dL — ABNORMAL HIGH (ref 70–99)
Glucose-Capillary: 156 mg/dL — ABNORMAL HIGH (ref 70–99)
Glucose-Capillary: 79 mg/dL (ref 70–99)
Glucose-Capillary: 133 mg/dL — ABNORMAL HIGH (ref 70–99)

## 2020-12-22 LAB — BASIC METABOLIC PANEL
Anion gap: 10 (ref 5–15)
BUN: 59 mg/dL — ABNORMAL HIGH (ref 8–23)
CO2: 23 mmol/L (ref 22–32)
Calcium: 9.3 mg/dL (ref 8.9–10.3)
Chloride: 104 mmol/L (ref 98–111)
Creatinine, Ser: 1.1 mg/dL — ABNORMAL HIGH (ref 0.44–1.00)
GFR, Estimated: 57 mL/min — ABNORMAL LOW (ref >60)
Glucose, Bld: 347 mg/dL — ABNORMAL HIGH (ref 70–99)
Potassium: 5.1 mmol/L (ref 3.5–5.1)
Sodium: 137 mmol/L (ref 135–145)

## 2020-12-22 MED ORDER — INSULIN ASPART 100 UNIT/ML ~~LOC~~ SOLN
4.0000 [IU] | Freq: Three times a day (TID) | SUBCUTANEOUS | Status: DC
  Administered 2020-12-23: 4 [IU] via SUBCUTANEOUS

## 2020-12-22 MED ORDER — INSULIN GLARGINE 100 UNIT/ML ~~LOC~~ SOLN
12.0000 [IU] | Freq: Two times a day (BID) | SUBCUTANEOUS | Status: DC
  Administered 2020-12-22 – 2020-12-23 (×2): 12 [IU] via SUBCUTANEOUS
  Filled 2020-12-22 (×2): qty 0.12

## 2020-12-22 MED ORDER — POLYETHYLENE GLYCOL 3350 17 G PO PACK
17.0000 g | PACK | Freq: Every day | ORAL | Status: DC
  Administered 2020-12-30: 17 g via ORAL
  Filled 2020-12-22 (×10): qty 1

## 2020-12-22 MED ORDER — SENNOSIDES-DOCUSATE SODIUM 8.6-50 MG PO TABS
2.0000 | ORAL_TABLET | Freq: Two times a day (BID) | ORAL | Status: DC
  Administered 2020-12-22 – 2021-01-02 (×8): 2 via ORAL
  Administered 2021-01-07: 1 via ORAL
  Filled 2020-12-22 (×27): qty 2

## 2020-12-22 NOTE — Progress Notes (Signed)
PHYSICAL THERAPY HYDROTHERAPY TREATMENT NOTE         12/22/20 1600  Subjective Assessment  Subjective Pt happy and pleasant.  Ready for session  Patient and Family Stated Goals agreed to wound care  Date of Onset  (present on admission)  Prior Treatments PLS/hydrotherapy with PT last admission  Evaluation and Treatment  Evaluation and Treatment Procedures Explained to Patient/Family Yes  Evaluation and Treatment Procedures agreed to  Wound / Incision (Open or Dehisced) 12/09/20 Other (Comment) Sacrum Medial open nonstageable sacral wound, malodorous with slough.  Date First Assessed: 12/09/20   Wound Type: Other (Comment)  Location: Sacrum  Location Orientation: Medial  Wound Description (Comments): open nonstageable sacral wound, malodorous with slough.  Dressing Type Foam - Lift dressing to assess site every shift;Normal saline moist dressing;Santyl;Barrier Film (skin prep)  Dressing Changed Changed  Dressing Status Old drainage  Dressing Change Frequency Twice a day  Site / Wound Assessment Pink;Yellow;Red  % Wound base Red or Granulating 75%  % Wound base Yellow/Fibrinous Exudate 25%  Peri-wound Assessment Erythema (non-blanchable);Pink;Maceration  Wound Length (cm) 3.5 cm  Wound Width (cm) 3 cm  Wound Depth (cm) 1.5 cm  Wound Volume (cm^3) 15.75 cm^3  Wound Surface Area (cm^2) 10.5 cm^2  Tunneling (cm)  (12:00)  Undermining (cm)  (10:00-5:00)  Margins Unattached edges (unapproximated)  Drainage Amount Moderate  Drainage Description Serosanguineous  Treatment Hydrotherapy (Pulse lavage);Packing (Saline gauze) (Santyl)  Hydrotherapy  Pulsed Lavage with Suction (psi) 8 psi  Pulsed Lavage with Suction - Normal Saline Used 1000 mL  Pulsed Lavage Tip Tip with splash shield  Pulsed lavage therapy - wound location sacrum  Wound Therapy - Assess/Plan/Recommendations  Wound Therapy - Clinical Statement wound bed continues to clean up well-goals have been met. main issue now  is with drainage management and periwound maceration. will continue to follow and perform hydrotherapy in house  Wound Therapy - Functional Problem List bed bound  Factors Delaying/Impairing Wound Healing Incontinence;Multiple medical problems  Hydrotherapy Plan Dressing change;Pulsatile lavage with suction  Wound Therapy - Frequency 6X / week  Wound Therapy - Current Recommendations Case manager/social work  Wound Therapy - Follow Up Recommendations Skilled nursing facility [facility])  Wound Plan continue hydrotherapy/PLS and dressing changes  Wound Therapy Goals - Improve the function of patient's integumentary system by progressing the wound(s) through the phases of wound healing by:  Decrease Necrotic Tissue to 25  Decrease Necrotic Tissue - Progress Met  Increase Granulation Tissue to 75  Increase Granulation Tissue - Progress Met  Improve Drainage Characteristics Min  Improve Drainage Characteristics - Progress Progressing toward goal  Goals/treatment plan/discharge plan were made with and agreed upon by patient/family Yes  Time For Goal Achievement 2 weeks  Wound Therapy - Potential for Goals Sheria Lang, PT Acute Rehabilitation  Office: [phone] Pager: [phone]

## 2020-12-22 NOTE — Plan of Care (Signed)
  Problem: Clinical Measurements: Goal: Cardiovascular complication will be avoided Outcome: Progressing   Problem: Nutrition: Goal: Adequate nutrition will be maintained Outcome: Progressing   Problem: Coping: Goal: Level of anxiety will decrease Outcome: Progressing   Problem: Elimination: Goal: Will not experience complications related to bowel motility Outcome: Progressing Goal: Will not experience complications related to urinary retention Outcome: Progressing   Problem: Safety: Goal: Ability to remain free from injury will improve Outcome: Progressing   Problem: Skin Integrity: Goal: Risk for impaired skin integrity will decrease Outcome: Progressing   Problem: Education: Goal: Knowledge of the prescribed therapeutic regimen will improve Outcome: Progressing   Problem: Clinical Measurements: Goal: Postoperative complications will be avoided or minimized Outcome: Progressing   Problem: Self-Care: Goal: Ability to meet self-care needs will improve Outcome: Progressing   Problem: Pain Management: Goal: Pain level will decrease with appropriate interventions Outcome: Progressing   Problem: Skin Integrity: Goal: Demonstration of wound healing without infection will improve Outcome: Progressing

## 2020-12-22 NOTE — Progress Notes (Signed)
PROGRESS NOTE    [name]  PRF:[id number] DOB: 20-Jun-1961 DOA: 12/07/2020 PCP: [name], MD    Chief Complaint  Patient presents with  . Altered Mental Status    Patient here via ems from facility with c/o AMS with yellow drainage from sacral ulcer with odor.    Brief Narrative: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 Patient was then admitted to the hospital for further evaluation and treatment. Patient completed a 7 days of antibiotics and currently waiting for LTAC placement.  PT recommended changing the dressing twice daily. Notified by RN that patient is constipated and disimpacted earlier this morning.  Stool softeners ordered.   Assessment & Plan:   Active Problems:   Anemia secondary to renal failure   History of simultaneous kidney and pancreas transplant ([place])   Chronic kidney disease (CKD) stage G3a/A1, moderately decreased glomerular filtration rate (GFR) between 45-59 mL/min/1.73 square meter and albuminuria creatinine ratio less than 30 mg/g (HCC)   Hypertension   Acute kidney injury superimposed on CKD (HCC)   Type 1 diabetes mellitus with complication, with long term current use of insulin pump (HCC)   Pressure injury of skin   Long-term use of immunosuppressant medication   Sepsis ([place])   Hypoalbuminemia   Hyponatremia   CAP (community acquired pneumonia)   Acute urinary retention   Acute metabolic encephalopathy   Sacral decubitus ulcer, stage III (HCC)    Cellulitis   Hypotension   Malodorous urine   Erosive esophagitis   Hypothyroid   Depression   Type 1 diabetes mellitus with hyperlipidemia (HCC)   Malnutrition of moderate degree   Acute metabolic encephalopathy secondary to infected sacral ulcer next appears to have resolved Completed 7 days of antibiotics.  Patient alert and oriented and answering questions appropriately    Cellulitis of the unstageable sacral decubitus ulcer CT of the pelvis just not show any osteomyelitis.  She completed 7 days of antibiotics at this time and currently getting hydrotherapy by PT PT recommending changing the dressing twice daily ,    Acute urinary retention with bacteriuria Probably colonization from yeast.  Resolved.    AKI on stage IIIa CKD s/p renal transplant Baseline creatinine around 1.2-1.3. Continue with Imuran, prednisone and cyclosporine. No changes in meds.   Constipation, impacted stool Disimpaction done and patient feels comfortable stool softeners ordered  Type I diabetic s/p pancreas transplant, poorly controlled with episodes of hypoglycemia next  Hemoglobin A1c at 7.2 CBG (last 3)  Recent Labs    12/21/20 1631 12/22/20 0731 12/22/20 1112  GLUCAP 218* 250* 156*   Elevated CBGs Increase Lantus to 12 units twice daily and NovoLog to 4 units 3 times daily AC.  Continue with sliding scale insulin.    S/p BKA for subacute osteomyelitis    History of erosive esophagitis Stable continue with PPI twice daily.   Hypothyroidism Continue with Synthroid.   Hyperlipidemia Continue with Crestor  Thrombocytopenia Repeat platelet count.   Depression Continue with Zoloft.   Hypophosphatemia Replaced  Pressure injury present on admission Pressure Injury 09/27/20 Sacrum Medial  Unstageable - Full thickness tissue loss in which the base of the injury is covered by slough (yellow, tan, gray, green or brown) and/or eschar (tan, brown or black) in the wound bed.  (Active)  09/27/20 1534  Location: Sacrum  Location Orientation: Medial  Staging: Unstageable - Full thickness tissue loss in which the base of the injury is covered by slough (yellow, tan, gray, green or brown) and/or eschar (tan, brown or black) in the wound bed.  Wound Description (Comments):   Present on Admission: Yes     Pressure Injury 12/10/20 Hip Left;Posterior;Lateral Stage 2 -  Partial thickness loss of dermis presenting as a shallow open injury with a red, pink wound bed without slough. (Active)  12/10/20 1325  Location: Hip  Location Orientation: Left;Posterior;Lateral  Staging: Stage 2 -  Partial thickness loss of dermis presenting as a shallow open injury with a red, pink wound bed without slough.  Wound Description (Comments):   Present on Admission: Yes (present on transfer to room 1402)     Pressure Injury 12/10/20 Heel Left;Posterior Stage 2 -  Partial thickness loss of dermis presenting as a shallow open injury with a red, pink wound bed without slough. (Active)  12/10/20 1325  Location: Heel  Location Orientation: Left;Posterior  Staging: Stage 2 -  Partial thickness loss of dermis presenting as a shallow open injury with a red, pink wound bed without slough.  Wound Description (Comments):   Present on Admission: Yes (upon transfer to 1402)   Wound care consulted and recommendations given Patient currently getting hydrotherapy from PT.    Moderate protein calorie malnutrition Dietary on and supplementations added.   Anemia of chronic disease: Macrocytic anemia Hemoglobin stable around 8   DVT prophylaxis: SCDs Code Status: Full code Family Communication: (None at bedside called husband and left a message Disposition:   Status is: Inpatient  Remains inpatient appropriate because:Unsafe d/c plan and Inpatient level of care appropriate due to severity of illness   Dispo: The patient is from: SNF              Anticipated d/c is to: LTAC               Anticipated d/c date is: > 3 days              Patient currently is medically stable to d/c.       Consultants:   Wound care  Procedures: None Antimicrobials: None  Subjective: Constipation Objective: Vitals:   12/21/20 2133 12/22/20 0441 12/22/20 0505 12/22/20 1317  BP: 120/73  126/66 113/63  Pulse: 97  76 72  Resp:   17 18  Temp:   98.2 F (36.8 C) 98 F (36.7 C)  TempSrc:   Oral   SpO2:   96% 99%  Weight:  77 kg    Height:  5\' 3"  (1.6 m)      Intake/Output Summary (Last 24 hours) at 12/22/2020 1600 Last data filed at 12/22/2020 1400 Gross per 24 hour  Intake 1060 ml  Output 2650 ml  Net -1590 ml   Filed Weights   12/20/20 0442 12/21/20 0500 12/22/20 0441  Weight: 76 kg 77 kg 77 kg    Examination:  General exam: Elderly pleasant lady not in any kind of distress. Respiratory system: Rotation bilaterally, no wheezing or rhonchi Cardiovascular system: S1-S2 heard, regular rate rhythm, no JVD Gastrointestinal system: Abdomen is soft, nontender, nondistended, bowel sounds normal Central nervous system: Alert and oriented Extremities: Right BKA Skin:  Unstageable sacral decubitus ulcer psychiatry: Mood is appropriate   Data Reviewed: I have personally reviewed following labs and imaging studies  CBC: Recent Labs  Lab 12/16/20 0510 12/17/20 0602 12/19/20 0557 12/22/20 0429  WBC  --  3.4* 2.9* 3.5*  HGB 8.6* 8.2* 8.2* 8.0*  HCT 28.0* 26.5* 26.2* 25.6*  MCV  --  107.7* 110.1* 109.9*  PLT  --  114* 145* 952    Basic Metabolic Panel: Recent Labs  Lab 12/17/20 0602 12/19/20 0557 12/22/20 0429  NA 139 138 137  K 4.9 5.0 5.1  CL 109 107 104  CO2 21* 23 23  GLUCOSE 132* 248* 347*  BUN 60* 65* 59*  CREATININE 1.17* 0.91 1.10*  CALCIUM 8.9 9.2 9.3    GFR: Estimated Creatinine Clearance: 52.7 mL/min (A) (by C-G formula based on SCr of 1.1 mg/dL (H)).  Liver Function Tests: No results for input(s): AST, ALT, ALKPHOS, BILITOT, PROT, ALBUMIN in  the last 168 hours.  CBG: Recent Labs  Lab 12/21/20 0741 12/21/20 1207 12/21/20 1631 12/22/20 0731 12/22/20 1112  GLUCAP 214* 165* 218* 250* 156*     No results found for this or any previous visit (from the past 240 hour(s)).       Radiology Studies: No results found.      Scheduled Meds: . azaTHIOprine  50 mg Oral Daily  . Chlorhexidine Gluconate Cloth  6 each Topical Daily  . cycloSPORINE  75 mg Oral BID  . feeding supplement  237 mL Oral BID BM  . folic acid  1 mg Oral Daily  . insulin aspart  0-9 Units Subcutaneous TID WC  . insulin aspart  4 Units Subcutaneous TID WC  . insulin glargine  12 Units Subcutaneous BID  . levothyroxine  50 mcg Oral Q0600  . mouth rinse  15 mL Mouth Rinse BID  . metoprolol tartrate  12.5 mg Oral BID  . multivitamin with minerals  1 tablet Oral Daily  . nutrition supplement (JUVEN)  1 packet Oral BID BM  . nystatin   Topical TID  . pantoprazole  40 mg Oral BID  . polyethylene glycol  17 g Oral Daily  . predniSONE  5 mg Oral Q breakfast  . rosuvastatin  5 mg Oral QHS  . senna-docusate  2 tablet Oral BID  . sertraline  25 mg Oral Daily  . sodium bicarbonate  650 mg Oral BID  . vitamin B-12  100 mcg Oral Daily   Continuous Infusions:   LOS: 15 days        Hosie Poisson, MD [facility]   To contact the attending provider between 7A-7P or the covering provider during after hours 7P-7A, please log into the web site [email or web address] and access using universal [place] password for that web site. If you do not have the password, please call the hospital operator.  12/22/2020, 4:00 PM

## 2020-12-22 NOTE — Progress Notes (Signed)
Pt notified RN of being very uncomfortable and unable to pass stool. Stool visible at rectum. Verbal order from Karleen Hampshire, MD received to disimpact pt. Large amount of clay-like stool removed. Pt verbalized great relief. MD to order stool softener for pt.

## 2020-12-22 NOTE — TOC Progression Note (Signed)
Transition of Care [facility]) - Progression Note    Patient Details  Name: [name] MRN: [id number] Date of Birth: 03/26/1961  [facility] of [facility]) CM/SW Contact  Tommey Barret, Juliann Pulse, RN Phone Number: 12/22/2020, 9:14 AM  Clinical Narrative: [facility] LTACH rep Emily-following-still awaiting outcome from expidited appeal-she has sent in updated notes.     Expected Discharge Plan: Long Term Acute Care (LTAC) Barriers to Discharge: Insurance Authorization  Expected Discharge Plan and Services Expected Discharge Plan: Long Term Acute Care (LTAC)   Discharge Planning Services: CM Consult Post Acute Care Choice: [place] Living arrangements for the past 2 months: Single Family Home                                       Social Determinants of Health (SDOH) Interventions    Readmission Risk Interventions No flowsheet data found.

## 2020-12-23 DIAGNOSIS — G9341 Metabolic encephalopathy: Secondary | ICD-10-CM | POA: Diagnosis not present

## 2020-12-23 DIAGNOSIS — N189 Chronic kidney disease, unspecified: Secondary | ICD-10-CM | POA: Diagnosis not present

## 2020-12-23 DIAGNOSIS — R338 Other retention of urine: Secondary | ICD-10-CM | POA: Diagnosis not present

## 2020-12-23 DIAGNOSIS — N179 Acute kidney failure, unspecified: Secondary | ICD-10-CM | POA: Diagnosis not present

## 2020-12-23 LAB — GLUCOSE, CAPILLARY
Glucose-Capillary: 101 mg/dL — ABNORMAL HIGH (ref 70–99)
Glucose-Capillary: 54 mg/dL — ABNORMAL LOW (ref 70–99)
Glucose-Capillary: 59 mg/dL — ABNORMAL LOW (ref 70–99)
Glucose-Capillary: 108 mg/dL — ABNORMAL HIGH (ref 70–99)
Glucose-Capillary: 125 mg/dL — ABNORMAL HIGH (ref 70–99)

## 2020-12-23 LAB — SARS CORONAVIRUS 2 (TAT 6-24 HRS): SARS Coronavirus 2: NEGATIVE

## 2020-12-23 MED ORDER — ADULT MULTIVITAMIN W/MINERALS CH: 1.0000 | ORAL_TABLET | Freq: Every day | ORAL | Status: DC

## 2020-12-23 MED ORDER — HYDROCODONE-ACETAMINOPHEN 5-325 MG PO TABS: 1.0000 | ORAL_TABLET | ORAL | 0 refills | Status: AC | PRN

## 2020-12-23 MED ORDER — DEXTROSE 50 % IV SOLN
12.5000 g | Freq: Once | INTRAVENOUS | Status: AC
  Administered 2020-12-23: 12.5 g via INTRAVENOUS
  Filled 2020-12-23: qty 50

## 2020-12-23 MED ORDER — ZINC OXIDE 40 % EX OINT: TOPICAL_OINTMENT | Freq: Every day | CUTANEOUS | 0 refills | Status: DC

## 2020-12-23 MED ORDER — LEVOTHYROXINE SODIUM 50 MCG PO TABS: 50.0000 ug | ORAL_TABLET | Freq: Every day | ORAL | 1 refills | Status: DC

## 2020-12-23 MED ORDER — POLYETHYLENE GLYCOL 3350 17 G PO PACK: 17.0000 g | PACK | Freq: Every day | ORAL | 0 refills | Status: DC

## 2020-12-23 MED ORDER — ZINC OXIDE 40 % EX OINT
TOPICAL_OINTMENT | Freq: Every day | CUTANEOUS | Status: DC
  Filled 2020-12-23: qty 57

## 2020-12-23 MED ORDER — ENSURE ENLIVE PO LIQD: 237.0000 mL | Freq: Two times a day (BID) | ORAL | 12 refills | Status: DC

## 2020-12-23 MED ORDER — INSULIN GLARGINE 100 UNIT/ML ~~LOC~~ SOLN: 8.0000 [IU] | Freq: Two times a day (BID) | SUBCUTANEOUS | 11 refills | Status: DC

## 2020-12-23 MED ORDER — INSULIN GLARGINE 100 UNIT/ML ~~LOC~~ SOLN: 12.0000 [IU] | Freq: Two times a day (BID) | SUBCUTANEOUS | 11 refills | Status: DC

## 2020-12-23 MED ORDER — INSULIN GLARGINE 100 UNIT/ML ~~LOC~~ SOLN
8.0000 [IU] | Freq: Two times a day (BID) | SUBCUTANEOUS | Status: DC
  Administered 2020-12-23 – 2020-12-28 (×9): 8 [IU] via SUBCUTANEOUS
  Filled 2020-12-23 (×11): qty 0.08

## 2020-12-23 MED ORDER — CYANOCOBALAMIN 100 MCG PO TABS: 100.0000 ug | ORAL_TABLET | Freq: Every day | ORAL | Status: DC

## 2020-12-23 MED ORDER — FOLIC ACID 1 MG PO TABS: 1.0000 mg | ORAL_TABLET | Freq: Every day | ORAL | 1 refills | Status: DC

## 2020-12-23 MED ORDER — SENNOSIDES-DOCUSATE SODIUM 8.6-50 MG PO TABS: 2.0000 | ORAL_TABLET | Freq: Two times a day (BID) | ORAL | 1 refills | Status: DC | PRN

## 2020-12-23 MED ORDER — JUVEN PO PACK: 1.0000 | PACK | Freq: Two times a day (BID) | ORAL | 12 refills | Status: DC

## 2020-12-23 MED ORDER — ZINC OXIDE 40 % EX OINT
TOPICAL_OINTMENT | Freq: Every day | CUTANEOUS | Status: DC
  Administered 2020-12-24 – 2020-12-28 (×2): 1 via TOPICAL
  Filled 2020-12-23 (×2): qty 57

## 2020-12-23 NOTE — Discharge Summary (Signed)
Physician Discharge Summary  [name] YKD:[id number] DOB: 02-14-1961 DOA: 12/07/2020  PCP: Doris Croak, MD  Admit date: 12/07/2020 Discharge date: 12/23/2020  Admitted From: Home.  Disposition:  LTAC  Recommendations for Outpatient Follow-up:  1. Follow up with PCP in 1-2 weeks 2. Please obtain BMP/CBC in one week Please follow up with wound care as recommended.  Please continue with hydrotherapy as recommended.   Discharge Condition:GUARDED.  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 Patient was then admitted to the hospital for further evaluation and treatment. Patient completed a 7 days of antibiotics and currently waiting for LTAC placement.  PT recommended changing the dressing twice daily/ prn soiling.    Discharge Diagnoses:  Active Problems:   Anemia secondary to renal failure   History of simultaneous kidney and pancreas transplant ([place])   Chronic kidney disease (CKD) stage G3a/A1, moderately decreased glomerular filtration rate (GFR) between 45-59 mL/min/1.73 square meter and albuminuria creatinine ratio less than 30 mg/g (HCC)   Hypertension   Acute kidney injury superimposed on CKD (HCC)   Type 1 diabetes mellitus with complication, with long term current use of insulin pump (HCC)   Pressure injury of skin   Long-term use of immunosuppressant medication   Sepsis ([place])    Hypoalbuminemia   Hyponatremia   CAP (community acquired pneumonia)   Acute urinary retention   Acute metabolic encephalopathy   Sacral decubitus ulcer, stage III (HCC)   Cellulitis   Hypotension   Malodorous urine   Erosive esophagitis   Hypothyroid   Depression   Type 1 diabetes mellitus with hyperlipidemia (HCC)   Malnutrition of moderate degree   Acute metabolic encephalopathy secondary to infected sacral ulcer next appears to have resolved Completed 7 days of antibiotics.  Patient alert and oriented and answering questions appropriately    Cellulitis of the unstageable sacral decubitus ulcer CT of the pelvis just not show any osteomyelitis.  She completed 7 days of antibiotics at this time and currently getting hydrotherapy by PT PT recommending changing the dressing twice daily ,    Acute urinary retention with bacteriuria Probably colonization from yeast.  Resolved.    AKI on stage IIIa CKD s/p renal transplant Baseline creatinine around 1.2-1.3. Continue with Imuran, prednisone and cyclosporine. No changes in meds.   Constipation, impacted stool Disimpaction done and patient feels comfortable stool softeners ordered  Type I diabetic s/p pancreas transplant, poorly controlled with episodes of hypoglycemia next  Hemoglobin A1c at 7.2 CBG (last 3)  Recent Labs    12/22/20 2214 12/23/20 0734 12/23/20 1134  GLUCAP 133* 101* 54*    Decrease the dose of lantus to 8 units due to an episode of hypoglycemia.  Resume the rest of the home meds.    S/p BKA for subacute osteomyelitis    History of erosive esophagitis Stable continue with PPI twice daily.   Hypothyroidism Continue with Synthroid.   Hyperlipidemia Continue with Crestor  Thrombocytopenia Resolved.   Depression Continue with Zoloft.  Hypophosphatemia Replaced  Pressure injury present on admission Pressure Injury 09/27/20 Sacrum Medial Unstageable - Full  thickness tissue loss in which the base of the injury is covered by slough (yellow, tan, gray, green or brown) and/or eschar (tan, brown or black) in the wound bed. (Active)  09/27/20 1534  Location: Sacrum  Location Orientation: Medial  Staging: Unstageable - Full thickness tissue loss in which the base of the injury is covered by slough (yellow, tan, gray, green or brown) and/or eschar (tan, brown or black) in the wound bed.  Wound Description (Comments):   Present on Admission: Yes     Pressure Injury 12/10/20 Hip Left;Posterior;Lateral Stage 2 -  Partial thickness loss of dermis presenting as a shallow open injury with a red, pink wound bed without slough. (Active)  12/10/20 1325  Location: Hip  Location Orientation: Left;Posterior;Lateral  Staging: Stage 2 -  Partial thickness loss of dermis presenting as a shallow open injury with a red, pink wound bed without slough.  Wound Description (Comments):   Present on Admission: Yes (present on transfer to room 1402)     Pressure Injury 12/10/20 Heel Left;Posterior Stage 2 -  Partial thickness loss of dermis presenting as a shallow open injury with a red, pink wound bed without slough. (Active)  12/10/20 1325  Location: Heel  Location Orientation: Left;Posterior  Staging: Stage 2 -  Partial thickness loss of dermis presenting as a shallow open injury with a red, pink wound bed without slough.  Wound Description (Comments):   Present on Admission: Yes (upon transfer to 1402)   Wound care consulted and recommendations given Patient currently getting hydrotherapy from PT.    Moderate protein calorie malnutrition Dietary on and supplementations added.   Anemia of chronic disease: Macrocytic anemia Hemoglobin stable around 8     Discharge Instructions  Discharge Instructions    Diet - low sodium heart healthy   Complete by: As directed    Discharge instructions   Complete by: As directed    Please follow up with PCP in  one week.  Please follow up with wound care.   Discharge wound care:   Complete by: As directed    Apply Aquacel packing to sacrum wound Q day, using swab to tuck underneath wound edges and fill space below skin level which surround the wound.  Apply Desitin to outer red wound edges, then cover with foam dressing.  (Change foam dressings Q 3 days or PRN soiling.)     Allergies as of 12/23/2020   No Known Allergies     Medication List    STOP taking these medications   traMADol 50 MG tablet Commonly known as: ULTRAM     TAKE these medications   acetaminophen 325 MG tablet Commonly known as: TYLENOL Take 2 tablets (650 mg total) by mouth every 6 (six) hours as needed for mild pain (or Fever >/= 101). What changed: reasons to take this   azaTHIOprine 50 MG tablet Commonly known as: IMURAN Take 50 mg by mouth daily.   collagenase ointment Commonly known as: SANTYL Apply topically daily.   cyanocobalamin 100 MCG tablet Take 1 tablet (100 mcg total) by mouth daily. Start taking on: December 24, 2020   cycloSPORINE 25 MG capsule Commonly known as: SANDIMMUNE Take 75 mg by mouth 2 (two) times daily.   feeding supplement Liqd Take 237 mLs by mouth 2 (two) times daily between meals.   nutrition supplement (JUVEN) Pack Take 1 packet by mouth 2 (two) times  daily between meals.   folic acid 1 MG tablet Commonly known as: FOLVITE Take 1 tablet (1 mg total) by mouth daily. Start taking on: December 24, 2020   HYDROcodone-acetaminophen 5-325 MG tablet Commonly known as: NORCO/VICODIN Take 1-2 tablets by mouth every 4 (four) hours as needed for up to 3 days for moderate pain.   insulin glargine 100 UNIT/ML injection Commonly known as: LANTUS Inject 0.12 mLs (12 Units total) into the skin 2 (two) times daily. What changed: how much to take   insulin lispro 100 UNIT/ML injection Commonly known as: HUMALOG Inject 2 Units into the skin 3 (three) times daily before meals.    levothyroxine 50 MCG tablet Commonly known as: SYNTHROID Take 1 tablet (50 mcg total) by mouth daily.   liver oil-zinc oxide 40 % ointment Commonly known as: DESITIN Apply topically daily.   metoprolol tartrate 25 MG tablet Commonly known as: LOPRESSOR Take 12.5 mg by mouth 2 (two) times daily.   multivitamin with minerals Tabs tablet Take 1 tablet by mouth daily. Start taking on: December 24, 2020   pantoprazole 40 MG tablet Commonly known as: PROTONIX Take 1 tablet (40 mg total) by mouth 2 (two) times daily.   polyethylene glycol 17 g packet Commonly known as: MIRALAX / GLYCOLAX Take 17 g by mouth daily. Start taking on: December 24, 2020   rosuvastatin 5 MG tablet Commonly known as: CRESTOR Take 1 tablet (5 mg total) by mouth at bedtime.   senna-docusate 8.6-50 MG tablet Commonly known as: Senokot-S Take 2 tablets by mouth 2 (two) times daily as needed for mild constipation.   sertraline 25 MG tablet Commonly known as: ZOLOFT Take 1 tablet (25 mg total) by mouth daily.   sodium bicarbonate 650 MG tablet Take 1 tablet (650 mg total) by mouth 2 (two) times daily.   torsemide 20 MG tablet Commonly known as: DEMADEX Take 1 tablet (20 mg total) by mouth daily.            Discharge Care Instructions  (From admission, onward)         Start     Ordered   12/23/20 0000  Discharge wound care:       Comments: Apply Aquacel packing to sacrum wound Q day, using swab to tuck underneath wound edges and fill space below skin level which surround the wound.  Apply Desitin to outer red wound edges, then cover with foam dressing.  (Change foam dressings Q 3 days or PRN soiling.)   12/23/20 1141          Follow-up Information    Doris Croak, MD. Schedule an appointment as soon as possible for a visit in 1 week(s).   Specialty: Family Medicine Contact information: [address] [place] [id number] [phone]              No Known  Allergies  Consultations:   Wound care   Procedures/Studies: CT PELVIS WO CONTRAST  Result Date: 12/07/2020 CLINICAL DATA:  Sacral ulcer, concern for osteomyelitis EXAM: CT PELVIS WITHOUT CONTRAST TECHNIQUE: Multidetector CT imaging of the pelvis was performed following the standard protocol without intravenous contrast. COMPARISON:  None. FINDINGS: Urinary Tract: Urinary bladder is markedly distended to the level of the umbilicus. There appears to be a renal transplant kidney in the cul-de-sac of the pelvis. Mild caliectasis. Bowel:  Unremarkable visualized pelvic bowel loops. Vascular/Lymphatic: Heavily calcified aorta and iliac vessels. No adenopathy. Reproductive:  No visible pelvic mass. Other:  No free fluid or free air. Musculoskeletal: Sacral decubital ulcer noted. No bone destruction in the sacrum or coccyx to suggest osteomyelitis. Prior right hip replacement. IMPRESSION: Sacral decubitus ulcer.  No evidence of osteomyelitis. Apparent renal transplant within the cul-de-sac of the pelvis. Mild caliectasis noted within the renal transplant. Urinary bladder is markedly distended to the level of the umbilicus. Aortoiliac atherosclerosis. Electronically Signed   By: Rolm Baptise M.D.   On: 12/07/2020 21:51   DG Chest Port 1 View  Result Date: 12/07/2020 CLINICAL DATA:  Questionable sepsis - evaluate for abnormality Altered mental status. EXAM: PORTABLE CHEST 1 VIEW COMPARISON:  Radiograph 09/26/2020. FINDINGS: Lung volumes are low. There are streaky opacities at the left lung base with ill-defined left hemidiaphragm. Stable heart size and mediastinal contours allowing for differences in positioning. No pulmonary edema. There is no pneumothorax or large pleural effusion. Remote right rib fractures. Bones are diffusely under mineralized. IMPRESSION: Low lung volumes with streaky opacities at the left lung base, which may represent atelectasis or pneumonia. Electronically Signed   By: Keith Rake M.D.   On: 12/07/2020 21:00   ECHOCARDIOGRAM COMPLETE  Result Date: 12/08/2020    ECHOCARDIOGRAM REPORT   Patient Name:   ANDREY HOOBLER Date of Exam: 12/08/2020 Medical Rec #:  [id number]       Height:       63.0 in Accession #:    [id number]      Weight:       115.0 lb Date of Birth:  August 27, 1961      BSA:          1.528 m Patient Age:    74 years        BP:           130/71 mmHg Patient Gender: F               HR:           85 bpm. Exam Location:  Inpatient Procedure: 2D Echo, Color Doppler and Cardiac Doppler Indications:    R94.31 Abnormal EKG  History:        Patient has no prior history of Echocardiogram examinations.                 Risk Factors:Diabetes.  Sonographer:    Bernadene Person RDCS Referring Phys: [place]  1. Left ventricular ejection fraction, by estimation, is 65 to 70%. The left ventricle has normal function. The left ventricle has no regional wall motion abnormalities. There is moderate left ventricular hypertrophy. Left ventricular diastolic parameters are consistent with Grade I diastolic dysfunction (impaired relaxation).  2. Right ventricular systolic function is normal. The right ventricular size is normal. Mildly increased right ventricular wall thickness. Tricuspid regurgitation signal is inadequate for assessing PA pressure.  3. The mitral valve is normal in structure. Trivial mitral valve regurgitation. No evidence of mitral stenosis.  4. The aortic valve was not well visualized. Aortic valve regurgitation is trivial. No aortic stenosis is present.  5. The inferior vena cava is normal in size with greater than 50% respiratory variability, suggesting right atrial pressure of 3 mmHg.  6. A small pericardial effusion is present. FINDINGS  Left Ventricle: Left ventricular ejection fraction, by estimation, is 65 to 70%. The left ventricle has normal function. The left ventricle has no regional wall motion abnormalities. The left ventricular internal  cavity size was normal in size. There is  moderate left ventricular hypertrophy. Left ventricular diastolic parameters are consistent  with Grade I diastolic dysfunction (impaired relaxation). Right Ventricle: The right ventricular size is normal. Mildly increased right ventricular wall thickness. Right ventricular systolic function is normal. Tricuspid regurgitation signal is inadequate for assessing PA pressure. Left Atrium: Left atrial size was normal in size. Right Atrium: Right atrial size was normal in size. Pericardium: A small pericardial effusion is present. Mitral Valve: The mitral valve is normal in structure. Trivial mitral valve regurgitation. No evidence of mitral valve stenosis. Tricuspid Valve: The tricuspid valve is normal in structure. Tricuspid valve regurgitation is not demonstrated. Aortic Valve: The aortic valve was not well visualized. Aortic valve regurgitation is trivial. Aortic regurgitation PHT measures 520 msec. No aortic stenosis is present. Pulmonic Valve: The pulmonic valve was normal in structure. Pulmonic valve regurgitation is not visualized. Aorta: The aortic root is normal in size and structure. Venous: The inferior vena cava is normal in size with greater than 50% respiratory variability, suggesting right atrial pressure of 3 mmHg. IAS/Shunts: No atrial level shunt detected by color flow Doppler.  LEFT VENTRICLE PLAX 2D LVIDd:         2.50 cm  Diastology LVIDs:         1.20 cm  LV e' medial:    3.59 cm/s LV PW:         1.70 cm  LV E/e' medial:  15.8 LV IVS:        1.70 cm  LV e' lateral:   3.81 cm/s LVOT diam:     1.80 cm  LV E/e' lateral: 14.9 LV SV:         48 LV SV Index:   31 LVOT Area:     2.54 cm  RIGHT VENTRICLE RV S prime:     18.30 cm/s TAPSE (M-mode): 1.4 cm LEFT ATRIUM             Index      RIGHT ATRIUM          Index LA diam:        3.30 cm 2.16 cm/m RA Area:     3.33 cm LA Vol (A2C):   11.4 ml 7.46 ml/m RA Volume:   3.61 ml  2.36 ml/m LA Vol (A4C):   8.6 ml   5.62 ml/m LA Biplane Vol: 10.1 ml 6.61 ml/m  AORTIC VALVE LVOT Vmax:   96.70 cm/s LVOT Vmean:  70.700 cm/s LVOT VTI:    0.188 m AI PHT:      520 msec  AORTA Ao Root diam: 3.30 cm Ao Asc diam:  2.60 cm MITRAL VALVE MV Area (PHT): 2.56 cm    SHUNTS MV Decel Time: 296 msec    Systemic VTI:  0.19 m MV E velocity: 56.60 cm/s  Systemic Diam: 1.80 cm MV A velocity: 72.00 cm/s MV E/A ratio:  0.79 Loralie Champagne MD Electronically signed by Loralie Champagne MD Signature Date/Time: 12/08/2020/5:15:53 PM    Final        Subjective: No new complaints.   Discharge Exam: Vitals:   12/22/20 2052 12/23/20 0550  BP: 125/60 130/70  Pulse: 86 73  Resp: 17 14  Temp: 98.7 F (37.1 C) 98.5 F (36.9 C)  SpO2: 98% 99%   Vitals:   12/22/20 0505 12/22/20 1317 12/22/20 2052 12/23/20 0550  BP: 126/66 113/63 125/60 130/70  Pulse: 76 72 86 73  Resp: 17 18 17 14   Temp: 98.2 F (36.8 C) 98 F (36.7 C) 98.7 F (37.1 C) 98.5 F (36.9 C)  TempSrc: Oral  SpO2: 96% 99% 98% 99%  Weight:    76 kg  Height:        General: Pt is alert, awake, not in acute distress Cardiovascular: RRR, S1/S2 +, no rubs, no gallops Respiratory: CTA bilaterally, no wheezing, no rhonchi Abdominal: Soft, NT, ND, bowel sounds + Extremities: no edema, no cyanosis    The results of significant diagnostics from this hospitalization (including imaging, microbiology, ancillary and laboratory) are listed below for reference.     Microbiology: No results found for this or any previous visit (from the past 240 hour(s)).   Labs: BNP (last 3 results) No results for input(s): BNP in the last 8760 hours. Basic Metabolic Panel: Recent Labs  Lab 12/17/20 0602 12/19/20 0557 12/22/20 0429  NA 139 138 137  K 4.9 5.0 5.1  CL 109 107 104  CO2 21* 23 23  GLUCOSE 132* 248* 347*  BUN 60* 65* 59*  CREATININE 1.17* 0.91 1.10*  CALCIUM 8.9 9.2 9.3   Liver Function Tests: No results for input(s): AST, ALT, ALKPHOS, BILITOT, PROT,  ALBUMIN in the last 168 hours. No results for input(s): LIPASE, AMYLASE in the last 168 hours. No results for input(s): AMMONIA in the last 168 hours. CBC: Recent Labs  Lab 12/17/20 0602 12/19/20 0557 12/22/20 0429  WBC 3.4* 2.9* 3.5*  HGB 8.2* 8.2* 8.0*  HCT 26.5* 26.2* 25.6*  MCV 107.7* 110.1* 109.9*  PLT 114* 145* 214   Cardiac Enzymes: No results for input(s): CKTOTAL, CKMB, CKMBINDEX, TROPONINI in the last 168 hours. BNP: Invalid input(s): POCBNP CBG: Recent Labs  Lab 12/22/20 1112 12/22/20 1613 12/22/20 2214 12/23/20 0734 12/23/20 1134  GLUCAP 156* 79 133* 101* 54*   D-Dimer No results for input(s): DDIMER in the last 72 hours. Hgb A1c No results for input(s): HGBA1C in the last 72 hours. Lipid Profile No results for input(s): CHOL, HDL, LDLCALC, TRIG, CHOLHDL, LDLDIRECT in the last 72 hours. Thyroid function studies No results for input(s): TSH, T4TOTAL, T3FREE, THYROIDAB in the last 72 hours.  Invalid input(s): FREET3 Anemia work up No results for input(s): VITAMINB12, FOLATE, FERRITIN, TIBC, IRON, RETICCTPCT in the last 72 hours. Urinalysis    Component Value Date/Time   COLORURINE YELLOW 12/07/2020 2006   APPEARANCEUR TURBID (A) 12/07/2020 2006   LABSPEC 1.008 12/07/2020 2006   PHURINE 6.0 12/07/2020 2006   GLUCOSEU NEGATIVE 12/07/2020 2006   HGBUR SMALL (A) 12/07/2020 2006   [place] NEGATIVE 12/07/2020 2006   [place] NEGATIVE 12/07/2020 2006   PROTEINUR 100 (A) 12/07/2020 2006   NITRITE NEGATIVE 12/07/2020 2006   LEUKOCYTESUR MODERATE (A) 12/07/2020 2006   Sepsis Labs Invalid input(s): PROCALCITONIN,  WBC,  LACTICIDVEN Microbiology No results found for this or any previous visit (from the past 240 hour(s)).   Time coordinating discharge: 32 minutes.   SIGNED:   Hosie Poisson, MD  [facility] 12/23/2020, 11:41 AM

## 2020-12-23 NOTE — Consult Note (Addendum)
[facility] Nurse Consult Note: Reason for Consult: Consult requested to assess sacrum wound with physical therapy.  They indicate that the Unstageable sacrum wound has greatly improved since admission and pt has obtained full benefits from hydrotherapy, since remaining amt of slough is tightly adhered to exposed bone and hydrotherapy is not effective in this case to the remaining area. Wound is beefy red with mod amt tan drainage, no odor.  Wound is 6X5.5X.8cm, 90% red, 10% yellow slough interspersed throughout with undermining to wound edges to 4 cm when swab was inserted.  The undermining to edges corresponds to red moist maceration to the skin surrounding wound to 4 cm; appearance is consistent with moisture associated skin damage (MASD). Pt also has MASD to bilat buttocks and perineum. 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 Discontinue Santyl and hydrotherapy.  Topical treatment orders provided for bedside nurses to perform as follows to absorb drainage and provide antimicrobial benefits: Apply Aquacel packing to sacrum wound Q day, using swab to tuck underneath wound edges and fill space below skin level which surround the wound.  Apply Desitin to outer red wound edges, then cover with foam dressing.  (Change foam dressings Q 3 days or PRN soiling.) Apply Desitin to perineum Q day and PRN to promote healing and repel moisture. Please re-consult if further assistance is needed.  Thank-you,  Julien Girt MSN, [place], [place], [place], [place]

## 2020-12-23 NOTE — Progress Notes (Signed)
Hypoglycemic Event  CBG: 54  Treatment: pt did not want juice; pt requested ginger ale  Symptoms: asymptomatic  Follow-up CBG: Time: 1210 CBG Result: 80  Possible Reasons for Event: insulin scale too sensitive   Comments/MD notified: Karleen Hampshire, MD    [facility]

## 2020-12-23 NOTE — TOC Progression Note (Addendum)
Transition of Care [facility]) - Progression Note    Patient Details  Name: [name] MRN: [id number] Date of Birth: 05-06-1961  [facility] CM/SW Contact  Marilou Barnfield, Juliann Pulse, RN Phone Number: 12/23/2020, 3:45 PM  Clinical Narrative: Patient was denied expidited appeal by her insurance per [facility] spoke to patient o phone-CM presented letter of denial to patient-informed her of tel# or fax# for 2nd level of appeal if she chooses to appeal-patient /spouse voiced understanding. Patient will call. She now agrees to SNF-will fax out once PT/OT if appropriate recc SNF once appeal process has been completed.      Expected Discharge Plan: [place] Barriers to Discharge: Continued Medical Work up  Expected Discharge Plan and Services Expected Discharge Plan: [place]   Discharge Planning Services: CM Consult Post Acute Care Choice: [place] Living arrangements for the past 2 months: Single Family Home                                       Social Determinants of Health (SDOH) Interventions    Readmission Risk Interventions No flowsheet data found.

## 2020-12-23 NOTE — Progress Notes (Signed)
Hypoglycemic Event  CBG: 59  Treatment: peanut butter and graham crackers, juice; per MD, administer 1/2 amp D50  Symptoms: asymptomatic  Follow-up CBG: Time: 1710 CBG Result: 108  Possible Reasons for Event: pt not eating much  Comments/MD notified: Karleen Hampshire, MD    [facility]

## 2020-12-23 NOTE — Progress Notes (Signed)
PT HYDROTHERAPY TREATMENT/DISCHARGE NOTE   **PT hydrotherapy goals met-pt discharged from caseload. Recommend nursing continue wound care**     12/23/20 1300  Subjective Assessment  Subjective "im gonna miss you guys"  Patient and Family Stated Goals agreed to wound care  Date of Onset  (present on admission)  Prior Treatments PLS/hydrotherapy with PT last admission  Evaluation and Treatment  Evaluation and Treatment Procedures Explained to Patient/Family Yes  Evaluation and Treatment Procedures agreed to  Wound / Incision (Open or Dehisced) 12/09/20 Other (Comment) Sacrum Medial open nonstageable sacral wound, malodorous with slough.  Date First Assessed: 12/09/20   Wound Type: Other (Comment)  Location: Sacrum  Location Orientation: Medial  Wound Description (Comments): open nonstageable sacral wound, malodorous with slough.  Dressing Type Silver hydrofiber;Foam - Lift dressing to assess site every shift  Dressing Changed Changed  Dressing Change Frequency Daily (should be checked and changed if saturated)  Site / Wound Assessment Pink;Red;Yellow  % Wound base Red or Granulating 80%  % Wound base Yellow/Fibrinous Exudate 20%  Peri-wound Assessment Erythema (non-blanchable);Maceration  Margins Unattached edges (unapproximated)  Drainage Amount Moderate  Drainage Description Serosanguineous  Treatment Hydrotherapy (Pulse lavage) (Aquacel Ag hydrofiber packing)  Hydrotherapy  Pulsed Lavage with Suction (psi) 8 psi  Pulsed Lavage with Suction - Normal Saline Used 1000 mL  Pulsed Lavage Tip Tip with splash shield  Pulsed lavage therapy - wound location sacrum  Wound Therapy - Assess/Plan/Recommendations  Wound Therapy - Clinical Statement Reconsulted WOCN on today. She updated dresssing recommendations to help manage drainage/periwound maceration (see WOCN note and orders). No further need for PT hydrotherapy. Will sign off. Recommend nursing continue with wound care  Wound  Therapy Goals - Improve the function of patient's integumentary system by progressing the wound(s) through the phases of wound healing by:  Decrease Necrotic Tissue to 25  Decrease Necrotic Tissue - Progress Met  Increase Granulation Tissue to 75  Increase Granulation Tissue - Progress Met  Improve Drainage Characteristics Min  Improve Drainage Characteristics - Progress Partly met (Nursing will continue with wound care)  Goals/treatment plan/discharge plan were made with and agreed upon by patient/family Yes    Doreatha Massed, PT Acute Rehabilitation  Office: [phone] Pager: [phone]

## 2020-12-24 ENCOUNTER — Ambulatory Visit: Payer: Medicare Other | Admitting: Diagnostic Neuroimaging

## 2020-12-24 DIAGNOSIS — G9341 Metabolic encephalopathy: Secondary | ICD-10-CM | POA: Diagnosis not present

## 2020-12-24 DIAGNOSIS — R338 Other retention of urine: Secondary | ICD-10-CM | POA: Diagnosis not present

## 2020-12-24 DIAGNOSIS — N179 Acute kidney failure, unspecified: Secondary | ICD-10-CM | POA: Diagnosis not present

## 2020-12-24 DIAGNOSIS — N189 Chronic kidney disease, unspecified: Secondary | ICD-10-CM | POA: Diagnosis not present

## 2020-12-24 LAB — GLUCOSE, CAPILLARY
Glucose-Capillary: 210 mg/dL — ABNORMAL HIGH (ref 70–99)
Glucose-Capillary: 185 mg/dL — ABNORMAL HIGH (ref 70–99)
Glucose-Capillary: 187 mg/dL — ABNORMAL HIGH (ref 70–99)
Glucose-Capillary: 225 mg/dL — ABNORMAL HIGH (ref 70–99)

## 2020-12-24 NOTE — Progress Notes (Signed)
Nutrition Follow-up  DOCUMENTATION CODES:   Non-severe (moderate) malnutrition in context of chronic illness  INTERVENTION:   -Ensure Enlive po BID, each supplement provides 350 kcal and 20 grams of protein  -Juven Fruit Punch BID, each serving provides 95kcal and 2.5g of protein (amino acids glutamine and arginine)  NUTRITION DIAGNOSIS:   Moderate Malnutrition related to chronic illness (stage 3 CKD) as evidenced by mild fat depletion,mild muscle depletion,moderate muscle depletion.  Ongoing.  GOAL:   Patient will meet greater than or equal to 90% of their needs  Progressing.  MONITOR:   PO intake,Supplement acceptance,Labs,Weight trends  ASSESSMENT:   59 year old female with medical history of chronic stage 3 pressure injury, multiple L heel wounds, type 1 DM, renal and pancreas transplant (1989), stage 3 CKD, HTN, prior R BKA d/t osteomyelitis. She was hospitalized from 11/13-11/17 due to coffee-ground emesis and was found to have severe esophagitis. She was then discharged back to SNF and again presented to the ED on 12/12 with AMS and increased drainage and odor from sacral wound with concern for infection.  Patient is currently documented  as consuming 100% of meals. But did have hypoglycemic event last night.  Encourage PO intakes.  Accepting both Ensure and Juven supplements.  MD placed discharge note in on 12/28. Per TOC note, pt now pursuing SNF at discharge.  Admission weight: 115 lbs Current weight: 163 lbs  Medications: Folic acid, Multivitamin with minerals daily, Vitamin B-12  Labs reviewed: CBGs: 59-210  Diet Order:   Diet Order            Diet - low sodium heart healthy           Diet Carb Modified Fluid consistency: Thin; Room service appropriate? Yes  Diet effective now                 EDUCATION NEEDS:   No education needs have been identified at this time  Skin:  Skin Assessment: Skin Integrity Issues: Skin Integrity Issues:: Stage  II,Unstageable,Incisions Stage II: L hip; L heel Unstageable: sacrum Incisions: L hand (12/16) Other: bilateral buttocks  Last BM:  12/20 (type 2 x2)  Height:   Ht Readings from Last 1 Encounters:  12/22/20 5\' 3"  (1.6 m)    Weight:   Wt Readings from Last 1 Encounters:  12/24/20 74 kg   BMI:  Body mass index is 28.9 kg/m.  Estimated Nutritional Needs:   Kcal:  1825-2090 kcal  Protein:  90-105 grams  Fluid:  >/= 2 L/day  Clayton Bibles, MS, RD, LDN Inpatient Clinical Dietitian Contact information available via Amion

## 2020-12-24 NOTE — Plan of Care (Signed)
  Problem: Clinical Measurements: Goal: Ability to maintain clinical measurements within normal limits will improve Outcome: Progressing Goal: Will remain free from infection Outcome: Progressing Goal: Diagnostic test results will improve Outcome: Progressing Goal: Cardiovascular complication will be avoided Outcome: Completed/Met   Problem: Activity: Goal: Risk for activity intolerance will decrease Outcome: Progressing   Problem: Nutrition: Goal: Adequate nutrition will be maintained Outcome: Progressing   Problem: Coping: Goal: Level of anxiety will decrease Outcome: Progressing   Problem: Elimination: Goal: Will not experience complications related to bowel motility Outcome: Completed/Met Goal: Will not experience complications related to urinary retention Outcome: Adequate for Discharge   Problem: Safety: Goal: Ability to remain free from injury will improve Outcome: Progressing   Problem: Education: Goal: Knowledge of the prescribed therapeutic regimen will improve Outcome: Completed/Met Goal: Ability to verbalize activity precautions or restrictions will improve Outcome: Completed/Met Goal: Understanding of discharge needs will improve Outcome: Completed/Met   Problem: Activity: Goal: Ability to perform//tolerate increased activity and mobilize with assistive devices will improve Outcome: Progressing   Problem: Clinical Measurements: Goal: Postoperative complications will be avoided or minimized Outcome: Progressing   Problem: Self-Care: Goal: Ability to meet self-care needs will improve Outcome: Progressing   Problem: Self-Concept: Goal: Ability to maintain and perform role responsibilities to the fullest extent possible will improve Outcome: Progressing   Problem: Pain Management: Goal: Pain level will decrease with appropriate interventions Outcome: Progressing   Problem: Skin Integrity: Goal: Demonstration of wound healing without infection will  improve Outcome: Completed/Met

## 2020-12-24 NOTE — Progress Notes (Signed)
PROGRESS NOTE  [name] HDQ:[id number] DOB: 1961-02-10 DOA: 12/07/2020 PCP: [name], MD   LOS: 17 days   Brief narrative: As per HPI,  [name] is a 59 year old female with medical history significant for chronic sacral decubitus ulcer stage III and multiple left heel ulcerations, type 1 diabetes, renal and pancreas transplant(1989) on prednisone, stage III CKD, HTN, right BKA for osteomyelitis and recent hospitalization from 11/13-11/17 for coffee-ground emesis was found to have severe esophagitis who presented from her SNF on 12/07/20 with reports of altered mental status in setting of increased drainage and odor from her sacral ulcer concerning for infection. Patient was then admitted to the hospital for further evaluation and treatment. Patient completed a 7 days of antibiotics and currently waiting for placement.PT recommended changing the dressing twice daily/ prn soiling.   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 failure   History of simultaneous kidney and pancreas transplant ([place])   Chronic kidney disease (CKD) stage G3a/A1, moderately decreased glomerular filtration rate (GFR) between 45-59 mL/min/1.73 square meter and albuminuria creatinine ratio less than 30 mg/g (HCC)   Hypertension   Acute kidney injury superimposed on CKD (HCC)   Type 1 diabetes mellitus with complication, with long term current use of insulin pump (HCC)   Pressure injury of skin   Long-term use of immunosuppressant medication   Sepsis ([place])   Hypoalbuminemia   Hyponatremia   CAP (community acquired pneumonia)   Acute urinary retention   Acute metabolic encephalopathy   Sacral decubitus ulcer, stage III (HCC)   Cellulitis   Hypotension   Malodorous urine   Erosive esophagitis   Hypothyroid   Depression   Type 1 diabetes mellitus with hyperlipidemia (HCC)   Malnutrition of moderate degree  Acute metabolic encephalopathy secondary to infected sacral  ulcer  It has resolved at this time.  Continue wound dressing.  Completed 7-day course of antibiotic  Cellulitis of the unstageable sacral decubitus ulcer CT of the pelvis did not show any osteomyelitis. Has completed 7 days of antibiotics at this time.  Continue wound care.  Acute urinary retention with bacteriuria Resolved.  AKI on stage IIIa CKD s/p renal transplant Baseline creatinine around 1.2-1.3. Continue Imuran, prednisone and cyclosporine.  Constipation, impacted stool Disimpaction done in hospitalization.  Continue stool softeners.  Type I diabetic s/p pancreas transplant, poorly controlled with episodes of hypoglycemia Hemoglobin A1c at 7.2. on Decreased dose of lantus to 8 units twice daily, sliding scale insulin.    S/p BKA for subacute osteomyelitis Supportive care  History of erosive esophagitis Stable, continue with PPI twice daily.  Hypothyroidism Continue Synthroid  Hyperlipidemia Continue Crestor  Thrombocytopenia Resolved.   Depression Continue with Zoloft.   Moderate protein calorie malnutrition.   Encourage oral intake, continue nutritional supplements  Pressure injury present on admission.  Continue wound care  Pressure Injury 12/10/20 Heel Left;Posterior Stage 2 -  Partial thickness loss of dermis presenting as a shallow open injury with a red, pink wound bed without slough. (Active)  12/10/20 1325  Location: Heel  Location Orientation: Left;Posterior  Staging: Stage 2 -  Partial thickness loss of dermis presenting as a shallow open injury with a red, pink wound bed without slough.  Wound Description (Comments):   Present on Admission: Yes (upon transfer to 1402)     Pressure Injury 12/24/20 Sacrum Stage 4 - Full thickness tissue loss with exposed bone, tendon or muscle. (Active)  12/24/20   Location: Sacrum  Location Orientation:  Staging: Stage 4 - Full thickness tissue loss with exposed bone, tendon or muscle.  Wound  Description (Comments):   Present on Admission: Yes     DVT prophylaxis: SCDs Start: 12/08/20 0053   Code Status: Full code  Family Communication: None today  Status is: Inpatient  Remains inpatient appropriate because:Unsafe d/c plan and Awaiting for placement   Dispo: The patient is from: Home              Anticipated d/c is to: Awaiting for placement              Anticipated d/c date is: 2 days              Patient currently is medically stable to d/c.  Consultants:  Wound care  Procedures:  Wound care  Antibiotics:  . None currently   Subjective: Today, patient was seen and examined at bedside.  Denies any pain, nausea, vomiting, shortness of breath fever.  Objective: Vitals:   12/24/20 0507 12/24/20 1131  BP: (!) 163/78 127/67  Pulse: 81 70  Resp: 20 18  Temp: 98.9 F (37.2 C) 97.8 F (36.6 C)  SpO2: 98% 99%    Intake/Output Summary (Last 24 hours) at 12/24/2020 1305 Last data filed at 12/24/2020 1000 Gross per 24 hour  Intake 600 ml  Output 700 ml  Net -100 ml   Filed Weights   12/22/20 0441 12/23/20 0550 12/24/20 0400  Weight: 77 kg 76 kg 74 kg   Body mass index is 28.9 kg/m.   Physical Exam: GENERAL: Patient is alert awake and oriented. Not in obvious distress. HENT: No scleral pallor or icterus. Pupils equally reactive to light. Oral mucosa is moist NECK: is supple, no gross swelling noted. CHEST: Clear to auscultation. No crackles or wheezes.  Diminished breath sounds bilaterally. CVS: S1 and S2 heard, no murmur. Regular rate and rhythm.  ABDOMEN: Soft, non-tender, bowel sounds are present. EXTREMITIES: Right below-knee amputation. CNS: Cranial nerves are intact.  Moves extremities. SKIN: warm and dry without rashes.  sacral decubitus ulceration.  Data Review: I have personally reviewed the following laboratory data and studies,  CBC: Recent Labs  Lab 12/19/20 0557 12/22/20 0429  WBC 2.9* 3.5*  HGB 8.2* 8.0*  HCT 26.2*  25.6*  MCV 110.1* 109.9*  PLT 145* 408   Basic Metabolic Panel: Recent Labs  Lab 12/19/20 0557 12/22/20 0429  NA 138 137  K 5.0 5.1  CL 107 104  CO2 23 23  GLUCOSE 248* 347*  BUN 65* 59*  CREATININE 0.91 1.10*  CALCIUM 9.2 9.3   Liver Function Tests: No results for input(s): AST, ALT, ALKPHOS, BILITOT, PROT, ALBUMIN in the last 168 hours. No results for input(s): LIPASE, AMYLASE in the last 168 hours. No results for input(s): AMMONIA in the last 168 hours. Cardiac Enzymes: No results for input(s): CKTOTAL, CKMB, CKMBINDEX, TROPONINI in the last 168 hours. BNP (last 3 results) No results for input(s): BNP in the last 8760 hours.  ProBNP (last 3 results) No results for input(s): PROBNP in the last 8760 hours.  CBG: Recent Labs  Lab 12/23/20 1710 12/23/20 2132 12/24/20 0631 12/24/20 0734 12/24/20 1129  GLUCAP 108* 125* 210* 185* 187*   Recent Results (from the past 240 hour(s))  SARS CORONAVIRUS 2 (TAT 6-24 HRS) Nasopharyngeal Nasopharyngeal Swab     Status: None   Collection Time: 12/23/20 12:59 PM   Specimen: Nasopharyngeal Swab  Result Value Ref Range Status   SARS Coronavirus 2 NEGATIVE NEGATIVE  Final    Comment: (NOTE) SARS-CoV-2 target nucleic acids are NOT DETECTED.  The SARS-CoV-2 RNA is generally detectable in upper and lower respiratory specimens during the acute phase of infection. Negative results do not preclude SARS-CoV-2 infection, do not rule out co-infections with other pathogens, and should not be used as the sole basis for treatment or other patient management decisions. Negative results must be combined with clinical observations, patient history, and epidemiological information. The expected result is Negative.  Fact Sheet for Patients: SugarRoll.be  Fact Sheet for Healthcare Providers: https://www.woods-mathews.com/  This test is not yet approved or cleared by the [place] FDA and  has  been authorized for detection and/or diagnosis of SARS-CoV-2 by FDA under an Emergency Use Authorization (EUA). This EUA will remain  in effect (meaning this test can be used) for the duration of the COVID-19 declaration under Se ction 564(b)(1) of the Act, 21 U.S.C. section 360bbb-3(b)(1), unless the authorization is terminated or revoked sooner.  Performed at [facility], [place] [address], [place], [place] [address]      Studies: No results found.    Flora Lipps, MD  [facility] 12/24/2020  If 7PM-7AM, please contact night-coverage

## 2020-12-25 ENCOUNTER — Inpatient Hospital Stay (HOSPITAL_COMMUNITY): Payer: BC Managed Care – PPO

## 2020-12-25 DIAGNOSIS — N189 Chronic kidney disease, unspecified: Secondary | ICD-10-CM | POA: Diagnosis not present

## 2020-12-25 DIAGNOSIS — N179 Acute kidney failure, unspecified: Secondary | ICD-10-CM | POA: Diagnosis not present

## 2020-12-25 DIAGNOSIS — R338 Other retention of urine: Secondary | ICD-10-CM | POA: Diagnosis not present

## 2020-12-25 DIAGNOSIS — G9341 Metabolic encephalopathy: Secondary | ICD-10-CM | POA: Diagnosis not present

## 2020-12-25 LAB — URINALYSIS, ROUTINE W REFLEX MICROSCOPIC
Bilirubin Urine: NEGATIVE
Glucose, UA: NEGATIVE mg/dL
Hgb urine dipstick: NEGATIVE
Ketones, ur: NEGATIVE mg/dL
Nitrite: NEGATIVE
Protein, ur: 100 mg/dL — AB
Specific Gravity, Urine: 1.01 (ref 1.005–1.030)
WBC, UA: >50 WBC/hpf — ABNORMAL HIGH (ref 0–5)
pH: 6 (ref 5.0–8.0)

## 2020-12-25 LAB — GLUCOSE, CAPILLARY
Glucose-Capillary: 129 mg/dL — ABNORMAL HIGH (ref 70–99)
Glucose-Capillary: 83 mg/dL (ref 70–99)
Glucose-Capillary: 188 mg/dL — ABNORMAL HIGH (ref 70–99)
Glucose-Capillary: 211 mg/dL — ABNORMAL HIGH (ref 70–99)

## 2020-12-25 LAB — LACTIC ACID, PLASMA: Lactic Acid, Venous: 1.6 mmol/L (ref 0.5–1.9)

## 2020-12-25 MED ORDER — PIPERACILLIN-TAZOBACTAM 3.375 G IVPB
3.3750 g | Freq: Three times a day (TID) | INTRAVENOUS | Status: AC
  Administered 2020-12-25 – 2020-12-30 (×15): 3.375 g via INTRAVENOUS
  Filled 2020-12-25 (×14): qty 50

## 2020-12-25 MED ORDER — SODIUM CHLORIDE 0.9 % IV BOLUS
500.0000 mL | Freq: Once | INTRAVENOUS | Status: AC
  Administered 2020-12-25: 500 mL via INTRAVENOUS

## 2020-12-25 MED ORDER — SODIUM CHLORIDE 0.9 % IV SOLN: INTRAVENOUS | Status: AC

## 2020-12-25 MED ORDER — SODIUM CHLORIDE 0.9 % IV SOLN
INTRAVENOUS | Status: DC | PRN
  Administered 2020-12-25 – 2021-01-06 (×6): 250 mL via INTRAVENOUS

## 2020-12-25 NOTE — Progress Notes (Addendum)
PROGRESS NOTE  [name] WEX:[id number] DOB: [birth year] DOA: 12/07/2020 PCP: [name], MD   LOS: 18 days   Brief narrative: As per HPI,  Ms. Mctier is a 59 year old female with medical history significant for chronic sacral decubitus ulcer stage III and multiple left heel ulcerations, type 1 diabetes, renal and pancreas transplant(1989) on prednisone, stage III CKD, HTN, right BKA for osteomyelitis and recent hospitalization from 11/13-11/17 for coffee-ground emesis was found to have severe esophagitis who presented from her SNF on 12/07/20 with reports of altered mental status in setting of increased drainage and odor from her sacral ulcer concerning for infection. Patient was then admitted to the hospital for further evaluation and treatment. Patient completed a 7 days of antibiotics and currently waiting for placement.PT recommended changing the dressing twice daily/ prn soiling.   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 failure   History of simultaneous kidney and pancreas transplant ([place])   Chronic kidney disease (CKD) stage G3a/A1, moderately decreased glomerular filtration rate (GFR) between 45-59 mL/min/1.73 square meter and albuminuria creatinine ratio less than 30 mg/g (HCC)   Hypertension   Acute kidney injury superimposed on CKD (HCC)   Type 1 diabetes mellitus with complication, with long term current use of insulin pump (HCC)   Pressure injury of skin   Long-term use of immunosuppressant medication   Sepsis ([place])   Hypoalbuminemia   Hyponatremia   CAP (community acquired pneumonia)   Acute urinary retention   Acute metabolic encephalopathy   Sacral decubitus ulcer, stage III (HCC)   Cellulitis   Hypotension   Malodorous urine   Erosive esophagitis   Hypothyroid   Depression   Type 1 diabetes mellitus with hyperlipidemia (HCC)   Malnutrition of moderate degree  Acute metabolic encephalopathy secondary to infected sacral  ulcer  Encephalopathy resolved at this time.  Continue wound dressing.  Completed 7-day course of antibiotic  Cellulitis of the unstageable sacral decubitus ulcer CT of the pelvis did not show any osteomyelitis. Has completed 7 days of antibiotics at this time.  Continue wound care.  Acute urinary retention with bacteriuria Resolved.  AKI on stage IIIa CKD s/p renal transplant Baseline creatinine around 1.2-1.3. Continue Imuran, prednisone and cyclosporine.  Constipation, impacted stool Disimpaction done in hospitalization.  Continue stool softeners.  Type I diabetic s/p pancreas transplant, poorly controlled with episodes of hypoglycemia Hemoglobin A1c at 7.2. on Decreased dose of lantus to 8 units twice daily, sliding scale insulin.    S/p BKA for subacute osteomyelitis Supportive care  History of erosive esophagitis Stable, continue with PPI twice daily.  Hypothyroidism Continue Synthroid  Hyperlipidemia Continue Crestor  Thrombocytopenia Resolved.   Depression Continue with Zoloft.  Moderate protein calorie malnutrition.   Encourage oral intake, continue nutritional supplements  Pressure injury present on admission.  Continue wound care  Pressure Injury 12/10/20 Heel Left;Posterior Stage 2 -  Partial thickness loss of dermis presenting as a shallow open injury with a red, pink wound bed without slough. (Active)  12/10/20 1325  Location: Heel  Location Orientation: Left;Posterior  Staging: Stage 2 -  Partial thickness loss of dermis presenting as a shallow open injury with a red, pink wound bed without slough.  Wound Description (Comments):   Present on Admission: Yes (upon transfer to 1402)     Pressure Injury 12/24/20 Sacrum Stage 4 - Full thickness tissue loss with exposed bone, tendon or muscle. (Active)  12/24/20   Location: Sacrum  Location Orientation:   Staging: Stage  4 - Full thickness tissue loss with exposed bone, tendon or muscle.   Wound Description (Comments):   Present on Admission: Yes   Addendum:  12/25/2020 1:33 PM   Nursing staff reported that the patient had new fever and was hypotensive. Patient little more sluggish today.  Will obtain chest x-ray, blood cultures, urinalysis and lactate stat.  Will give  normal saline bolus.  We will get wound care to see the sacral wound due to some drainage.  We will start the patient on Zosyn since x-ray showed infiltrate.  Put the patient on maintenance IV fluids.  Blood pressure slightly improved after bolus.  DVT prophylaxis: SCDs Start: 12/08/20 0053   Code Status: Full code  Family Communication: Unable to reach the patient's husband Mr. [name] on the phone today.  Number picked by the patient herself.  Status is: Inpatient  Remains inpatient appropriate because:Unsafe d/c plan and Awaiting for placement   Dispo: The patient is from: Home              Anticipated d/c is to: Awaiting for placement              Anticipated d/c date is: 1 to 2 days.              Patient currently is medically stable to d/c.  Consultants:  Wound care  Procedures:  Wound care  Antibiotics:  . None currently   Subjective: Today, patient was seen and examined at bedside.  States that she could not sleep well.  Denies any nausea vomiting, pain.  .  Objective: Vitals:   12/24/20 2058 12/25/20 0538  BP: 118/66 115/65  Pulse: 78 99  Resp: 16 16  Temp: 98.6 F (37 C) 98.9 F (37.2 C)  SpO2: 98% 100%    Intake/Output Summary (Last 24 hours) at 12/25/2020 0924 Last data filed at 12/25/2020 0500 Gross per 24 hour  Intake 720 ml  Output 1325 ml  Net -605 ml   Filed Weights   12/23/20 0550 12/24/20 0400 12/25/20 0538  Weight: 76 kg 74 kg 74 kg   Body mass index is 28.9 kg/m.   Physical Exam: GENERAL: Patient is alert awake and carried from not in obvious distress. HENT: No scleral pallor or icterus. Pupils equally reactive to light. Oral mucosa is  moist NECK: is supple, no gross swelling noted. CHEST: Clear to auscultation. No crackles or wheezes.  Diminished breath sounds bilaterally. CVS: S1 and S2 heard, no murmur. Regular rate and rhythm.  ABDOMEN: Soft, non-tender, bowel sounds are present. EXTREMITIES: Right below-knee amputation with healthy stump. CNS: Cranial nerves are intact.  Moves extremities. SKIN: warm and dry without rashes.  sacral decubitus ulceration-present on admission  Data Review: I have personally reviewed the following laboratory data and studies,  CBC: Recent Labs  Lab 12/19/20 0557 12/22/20 0429  WBC 2.9* 3.5*  HGB 8.2* 8.0*  HCT 26.2* 25.6*  MCV 110.1* 109.9*  PLT 145* 182   Basic Metabolic Panel: Recent Labs  Lab 12/19/20 0557 12/22/20 0429  NA 138 137  K 5.0 5.1  CL 107 104  CO2 23 23  GLUCOSE 248* 347*  BUN 65* 59*  CREATININE 0.91 1.10*  CALCIUM 9.2 9.3   Liver Function Tests: No results for input(s): AST, ALT, ALKPHOS, BILITOT, PROT, ALBUMIN in the last 168 hours. No results for input(s): LIPASE, AMYLASE in the last 168 hours. No results for input(s): AMMONIA in the last 168 hours. Cardiac Enzymes: No results for input(s):  CKTOTAL, CKMB, CKMBINDEX, TROPONINI in the last 168 hours. BNP (last 3 results) No results for input(s): BNP in the last 8760 hours.  ProBNP (last 3 results) No results for input(s): PROBNP in the last 8760 hours.  CBG: Recent Labs  Lab 12/24/20 0631 12/24/20 0734 12/24/20 1129 12/24/20 1617 12/25/20 0722  GLUCAP 210* 185* 187* 225* 129*   Recent Results (from the past 240 hour(s))  SARS CORONAVIRUS 2 (TAT 6-24 HRS) Nasopharyngeal Nasopharyngeal Swab     Status: None   Collection Time: 12/23/20 12:59 PM   Specimen: Nasopharyngeal Swab  Result Value Ref Range Status   SARS Coronavirus 2 NEGATIVE NEGATIVE Final    Comment: (NOTE) SARS-CoV-2 target nucleic acids are NOT DETECTED.  The SARS-CoV-2 RNA is generally detectable in upper and  lower respiratory specimens during the acute phase of infection. Negative results do not preclude SARS-CoV-2 infection, do not rule out co-infections with other pathogens, and should not be used as the sole basis for treatment or other patient management decisions. Negative results must be combined with clinical observations, patient history, and epidemiological information. The expected result is Negative.  Fact Sheet for Patients: SugarRoll.be  Fact Sheet for Healthcare Providers: https://www.woods-mathews.com/  This test is not yet approved or cleared by the [place] FDA and  has been authorized for detection and/or diagnosis of SARS-CoV-2 by FDA under an Emergency Use Authorization (EUA). This EUA will remain  in effect (meaning this test can be used) for the duration of the COVID-19 declaration under Se ction 564(b)(1) of the Act, 21 U.S.C. section 360bbb-3(b)(1), unless the authorization is terminated or revoked sooner.  Performed at [facility], [place] [address], [place], [place] [address]      Studies: No results found.    Flora Lipps, MD  [facility] 12/25/2020  If 7PM-7AM, please contact night-coverage

## 2020-12-25 NOTE — Progress Notes (Signed)
Pharmacy Antibiotic Note  [name] is a 58 y.o. female admitted on 12/07/2020 with infected sacral ulcer having completed 7 days of antibiotics.  Pharmacy has been consulted for Zosyn dosing for possible PNA.  Plan: Zosyn 3.375g IV q8h (4 hour infusion).   Will sign off and follow remotely.   Thank you for allowing pharmacy to be a part of this patient's care.  Height: 5\' 3"  (160 cm) Weight: 74 kg (163 lb 2.3 oz) IBW/kg (Calculated) : 52.4  Temp (24hrs), Avg:99.5 F (37.5 C), Min:98.6 F (37 C), Max:101.3 F (38.5 C)  Recent Labs  Lab 12/19/20 0557 12/22/20 0429  WBC 2.9* 3.5*  CREATININE 0.91 1.10*    Estimated Creatinine Clearance: 51.7 mL/min (A) (by C-G formula based on SCr of 1.1 mg/dL (H)).    No Known Allergies  [place] Form 12/25/2020 2:21 PM

## 2020-12-25 NOTE — Progress Notes (Signed)
   12/25/20 1222  Assess: MEWS Score  Temp (!) 101.3 F (38.5 C)  Assess: MEWS Score  MEWS Temp 1  MEWS Systolic 2  MEWS Pulse 0  MEWS RR 0  MEWS LOC 0  MEWS Score 3  MEWS Score Color Yellow  Treat  Complains of Fever  Interventions Medication (see MAR)

## 2020-12-25 NOTE — Consult Note (Addendum)
[facility] Nurse Re-consult Note: Refer to recent [facility] consult notes from 12/28 for wound assessment and measurements and plan of care.   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   Wound appearance is unchanged; Wound is beefy red with mod amt tan drainage, no odor, exposed bone.  Wound  90% red, 10% yellow slough interspersed throughout with undermining to wound edges when swab was inserted. The undermining to edges corresponds to red moist maceration to the skin surrounding wound; appearance is consistent with moisture associated skin damage (MASD). Pt also has MASD to bilat buttocks and perineum. 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  Apply Desitin to outer red wound edges, then cover with foam dressing.  (Change foam dressings Q 3 days or PRN soiling.) If source of possible sepsis is unknown; then consider x-ray to R/O  osteomyelitis. Please re-consult if further assistance is needed.  Thank-you,  Julien Girt MSN, [place], [place], [place], [employer]

## 2020-12-25 NOTE — Progress Notes (Signed)
Pt's manual BP  78/38, HR 90's, rectal temp 101.3. Pt denies feeling dizzy or lightheaded. States "I just feel lousy." Dr. Louanne Belton made aware. Tylenol given per PRN orders. 500 NS bolus ordered per Dr. Louanne Belton. Will continue to monitor.

## 2020-12-25 NOTE — Progress Notes (Signed)
   12/25/20 1155  Assess: MEWS Score  BP (!) 78/38  Level of Consciousness Alert  Assess: MEWS Score  MEWS Temp 0  MEWS Systolic 2  MEWS Pulse 0  MEWS RR 0  MEWS LOC 0  MEWS Score 2  MEWS Score Color Yellow  Assess: if the MEWS score is Yellow or Red  Were vital signs taken at a resting state? Yes  Focused Assessment Change from prior assessment (see assessment flowsheet)  Early Detection of Sepsis Score *See Row Information* Low  MEWS guidelines implemented *See Row Information* Yes  Treat  Pain Scale 0-10  Pain Score 0  Take Vital Signs  Increase Vital Sign Frequency  Yellow: Q 2hr X 2 then Q 4hr X 2, if remains yellow, continue Q 4hrs  Escalate  MEWS: Escalate Yellow: discuss with charge nurse/RN and consider discussing with provider and RRT  Notify: Charge Nurse/RN  Name of Charge Nurse/RN Notified Marissa Long  Date Charge Nurse/RN Notified 12/25/20  Time Charge Nurse/RN Notified 1200  Notify: Provider  Provider Name/Title Dr. Louanne Belton  Date Provider Notified 12/25/20  Time Provider Notified 1159  Notification Type Page  Notification Reason Change in status

## 2020-12-25 NOTE — Progress Notes (Signed)
In report Sharyn Blitz, RN informed me that a UA was done for this patient today after she spiked a fever. During bedside report we noticed sediment in urine. On-call provider [place] notified.

## 2020-12-26 DIAGNOSIS — G9341 Metabolic encephalopathy: Secondary | ICD-10-CM | POA: Diagnosis not present

## 2020-12-26 DIAGNOSIS — N179 Acute kidney failure, unspecified: Secondary | ICD-10-CM | POA: Diagnosis not present

## 2020-12-26 DIAGNOSIS — N189 Chronic kidney disease, unspecified: Secondary | ICD-10-CM | POA: Diagnosis not present

## 2020-12-26 DIAGNOSIS — R338 Other retention of urine: Secondary | ICD-10-CM | POA: Diagnosis not present

## 2020-12-26 LAB — GLUCOSE, CAPILLARY
Glucose-Capillary: 170 mg/dL — ABNORMAL HIGH (ref 70–99)
Glucose-Capillary: 120 mg/dL — ABNORMAL HIGH (ref 70–99)
Glucose-Capillary: 200 mg/dL — ABNORMAL HIGH (ref 70–99)

## 2020-12-26 NOTE — TOC Progression Note (Signed)
Transition of Care [facility]) - Progression Note    Patient Details  Name: [name] MRN: [id number] Date of Birth: November 04, 1961  [facility] CM/SW Contact  Layten Aiken, Juliann Pulse, RN Phone Number: 12/26/2020, 2:43 PM  Clinical Narrative: Informed patient that since have not heard back from 2nd level appeal-they recc lower level of care-means SNF-we will pursue SNF-Patient voiced understanding & agreed-[facility] only SNF to accept-patient now says no SNF-she will inform her spouse first.Informed her that only other alternative is home-we currently do not have a [place] agency to accept-will continue to check.      Expected Discharge Plan: [place] Barriers to Discharge: Continued Medical Work up  Expected Discharge Plan and Services Expected Discharge Plan: [place]   Discharge Planning Services: CM Consult Post Acute Care Choice: [place] Living arrangements for the past 2 months: Single Family Home                                       Social Determinants of Health (SDOH) Interventions    Readmission Risk Interventions No flowsheet data found.

## 2020-12-26 NOTE — Progress Notes (Signed)
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 Patient was then admitted to the hospital for further evaluation and treatment. Patient completed a 7 days of antibiotics and currently waiting for placement.PT recommended changing the dressing twice daily/ prn soiling.   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 failure   History of simultaneous kidney and pancreas transplant ([place])   Chronic kidney disease (CKD) stage G3a/A1, moderately decreased glomerular filtration rate (GFR) between 45-59 mL/min/1.73 square meter and albuminuria creatinine ratio less than 30 mg/g (HCC)   Hypertension   Acute kidney injury superimposed on CKD (HCC)   Type 1 diabetes mellitus with complication, with long term current use of insulin pump (HCC)   Pressure injury of skin   Long-term use of immunosuppressant medication   Sepsis ([place])   Hypoalbuminemia   Hyponatremia   CAP (community acquired pneumonia)   Acute urinary retention   Acute metabolic encephalopathy   Sacral decubitus ulcer, stage III (HCC)   Cellulitis   Hypotension   Malodorous urine   Erosive esophagitis   Hypothyroid   Depression   Type 1 diabetes mellitus with hyperlipidemia (HCC)   Malnutrition of moderate degree  Acute metabolic encephalopathy secondary to infected sacral  ulcer  Encephalopathy resolved at this time.  Continue wound dressing.  Completed 7-day course of antibiotic  New fever and hypotension.  Chest x-ray with infiltrate.  Patient has been started on Zosyn.  Blood pressure has improved.  Received IV fluid bolus.  On maintenance fluids.  Repeat blood culture negative so far.  Cellulitis of the unstageable sacral decubitus ulcer CT of the pelvis did not show any osteomyelitis. Completed 7-day course of antibiotic.  Back on Zosyn for possible pneumonia.  Acute urinary retention with bacteriuria Resolved.  AKI on stage IIIa CKD s/p renal transplant Baseline creatinine around 1.2-1.3. Continue Imuran, prednisone and cyclosporine.  Constipation, impacted stool Disimpaction done in hospitalization.  Continue stool softeners.  No further issues.  Type I diabetic s/p pancreas transplant, poorly controlled with episodes of hypoglycemia Hemoglobin A1c at 7.2. on Decreased dose of lantus to 8 units twice daily, sliding scale insulin.  Closely monitor blood glucose levels.  Latest PSA glucose of 120.  S/p BKA for subacute osteomyelitis Supportive care  History of erosive esophagitis Stable, continue with PPI twice daily.  Hypothyroidism Continue Synthroid  Hyperlipidemia Continue Crestor  Thrombocytopenia Resolved.   Depression Continue with Zoloft.  Moderate protein calorie malnutrition.   Encourage oral intake, continue nutritional supplements  Pressure injury present on admission.  Continue wound care  Pressure Injury 12/10/20 Heel Left;Posterior Stage 2 -  Partial thickness loss of dermis presenting as a shallow open injury with a red, pink wound bed without slough. (Active)  12/10/20 1325  Location: Heel  Location Orientation: Left;Posterior  Staging: Stage 2 -  Partial thickness loss of dermis presenting as a shallow open injury with a red, pink wound bed  without slough.  Wound Description (Comments):   Present  on Admission: Yes (upon transfer to 1402)     Pressure Injury 12/24/20 Sacrum Stage 4 - Full thickness tissue loss with exposed bone, tendon or muscle. (Active)  12/24/20   Location: Sacrum  Location Orientation:   Staging: Stage 4 - Full thickness tissue loss with exposed bone, tendon or muscle.  Wound Description (Comments):   Present on Admission: Yes     DVT prophylaxis: SCDs Start: 12/08/20 0053   Code Status: Full code  Family Communication: None.  Unable to reach the patient's husband  Status is: Inpatient  Remains inpatient appropriate because:Unsafe d/c plan and Awaiting for placement   Dispo: The patient is from: Home              Anticipated d/c is to: Awaiting for placement              Anticipated d/c date is: 1 to 2 days.              Patient currently is medically stable to d/c.  Consultants:  Wound care  Procedures:  Wound care  Antibiotics:  . None currently   Subjective: Today, patient was seen and examined at bedside.  Feels better today.  No shortness of breath chest pain, nausea or vomiting.  No fever today.  Objective: Vitals:   12/25/20 2118 12/26/20 0520  BP: 118/61 113/60  Pulse: 97 80  Resp: 20 19  Temp: 98.2 F (36.8 C) 98.2 F (36.8 C)  SpO2: 98% 96%    Intake/Output Summary (Last 24 hours) at 12/26/2020 1306 Last data filed at 12/26/2020 0521 Gross per 24 hour  Intake 2125.28 ml  Output 1450 ml  Net 675.28 ml   Filed Weights   12/24/20 0400 12/25/20 0538 12/26/20 0500  Weight: 74 kg 74 kg 74 kg   Body mass index is 28.9 kg/m.   Physical Exam: GENERAL: Patient is alert awake and carried from not in obvious distress HENT: No scleral pallor or icterus. Pupils equally reactive to light. Oral mucosa is moist NECK: is supple, no gross swelling noted. CHEST:  Diminished breath sounds bilaterally. CVS: S1 and S2 heard, no murmur. Regular rate and rhythm.  ABDOMEN: Soft, non-tender, bowel sounds are  present. EXTREMITIES: Right below-knee amputation with healthy stump. CNS: Cranial nerves are intact.  Moves extremities. SKIN: warm and dry without rashes.  sacral decubitus ulceration-present on admission  Data Review: I have personally reviewed the following laboratory data and studies,  CBC: Recent Labs  Lab 12/22/20 0429  WBC 3.5*  HGB 8.0*  HCT 25.6*  MCV 109.9*  PLT 564   Basic Metabolic Panel: Recent Labs  Lab 12/22/20 0429  NA 137  K 5.1  CL 104  CO2 23  GLUCOSE 347*  BUN 59*  CREATININE 1.10*  CALCIUM 9.3   Liver Function Tests: No results for input(s): AST, ALT, ALKPHOS, BILITOT, PROT, ALBUMIN in the last 168 hours. No results for input(s): LIPASE, AMYLASE in the last 168 hours. No results for input(s): AMMONIA in the last 168 hours. Cardiac Enzymes: No results for input(s): CKTOTAL, CKMB, CKMBINDEX, TROPONINI in the last 168 hours. BNP (last 3 results) No results for input(s): BNP in the last 8760 hours.  ProBNP (last 3 results) No results for input(s): PROBNP in the last 8760 hours.  CBG: Recent Labs  Lab 12/25/20 1145 12/25/20 1703 12/25/20 2131 12/26/20 0754 12/26/20 1156  GLUCAP 83 188* 211* 170* 120*   Recent Results (  from the past 240 hour(s))  SARS CORONAVIRUS 2 (TAT 6-24 HRS) Nasopharyngeal Nasopharyngeal Swab     Status: None   Collection Time: 12/23/20 12:59 PM   Specimen: Nasopharyngeal Swab  Result Value Ref Range Status   SARS Coronavirus 2 NEGATIVE NEGATIVE Final    Comment: (NOTE) SARS-CoV-2 target nucleic acids are NOT DETECTED.  The SARS-CoV-2 RNA is generally detectable in upper and lower respiratory specimens during the acute phase of infection. Negative results do not preclude SARS-CoV-2 infection, do not rule out co-infections with other pathogens, and should not be used as the sole basis for treatment or other patient management decisions. Negative results must be combined with clinical observations, patient history,  and epidemiological information. The expected result is Negative.  Fact Sheet for Patients: SugarRoll.be  Fact Sheet for Healthcare Providers: https://www.woods-mathews.com/  This test is not yet approved or cleared by the [place] FDA and  has been authorized for detection and/or diagnosis of SARS-CoV-2 by FDA under an Emergency Use Authorization (EUA). This EUA will remain  in effect (meaning this test can be used) for the duration of the COVID-19 declaration under Se ction 564(b)(1) of the Act, 21 U.S.C. section 360bbb-3(b)(1), unless the authorization is terminated or revoked sooner.  Performed at [facility], [place] [address], [place], [place] [address]   Culture, blood (Routine X 2) w Reflex to ID Panel     Status: None (Preliminary result)   Collection Time: 12/25/20  1:26 PM   Specimen: BLOOD LEFT HAND  Result Value Ref Range Status   Specimen Description   Final    BLOOD LEFT HAND Performed at [facility] [address] [place], [place] [address]    Special Requests   Final    BOTTLES DRAWN AEROBIC ONLY Blood Culture adequate volume Performed at [facility] [address], [place], [place] [address]    Culture   Final    NO GROWTH < 24 HOURS Performed at [facility] [address] [place], [place] [address]    Report Status PENDING  Incomplete  Culture, blood (Routine X 2) w Reflex to ID Panel     Status: None (Preliminary result)   Collection Time: 12/25/20  1:26 PM   Specimen: BLOOD RIGHT HAND  Result Value Ref Range Status   Specimen Description   Final    BLOOD RIGHT HAND Performed at [facility] [address], [place], [place] [address]    Special Requests   Final    BOTTLES DRAWN AEROBIC ONLY Blood Culture adequate volume Performed at [facility] [address] [place], [place] [address]    Culture   Final     NO GROWTH < 24 HOURS Performed at [facility] [address], [place], [place] [address]    Report Status PENDING  Incomplete     Studies: DG CHEST PORT 1 VIEW  Result Date: 12/25/2020 CLINICAL DATA:  Infected sacral decubitus ulcer. EXAM: PORTABLE CHEST 1 VIEW COMPARISON:  12/07/2020 FINDINGS: Poor inspiration with increased patchy and linear density at both lung bases, left greater than right. The remainder of the lungs are clear with normal vascularity. Normal sized heart. Left axillary surgical clips. Moderate left and mild right glenohumeral joint degenerative changes. IMPRESSION: Poor inspiration with bibasilar atelectasis and possible pneumonia, left greater than right. Electronically Signed   By: Claudie Revering M.D.   On: 12/25/2020 13:22      Tomoya Ringwald  Davione Lenker, MD  [facility] 12/26/2020  If 7PM-7AM, please contact night-coverage

## 2020-12-27 DIAGNOSIS — G9341 Metabolic encephalopathy: Secondary | ICD-10-CM | POA: Diagnosis not present

## 2020-12-27 DIAGNOSIS — R338 Other retention of urine: Secondary | ICD-10-CM | POA: Diagnosis not present

## 2020-12-27 DIAGNOSIS — N189 Chronic kidney disease, unspecified: Secondary | ICD-10-CM | POA: Diagnosis not present

## 2020-12-27 DIAGNOSIS — N179 Acute kidney failure, unspecified: Secondary | ICD-10-CM | POA: Diagnosis not present

## 2020-12-27 LAB — GLUCOSE, CAPILLARY
Glucose-Capillary: 177 mg/dL — ABNORMAL HIGH (ref 70–99)
Glucose-Capillary: 83 mg/dL (ref 70–99)
Glucose-Capillary: 61 mg/dL — ABNORMAL LOW (ref 70–99)
Glucose-Capillary: 141 mg/dL — ABNORMAL HIGH (ref 70–99)
Glucose-Capillary: 66 mg/dL — ABNORMAL LOW (ref 70–99)
Glucose-Capillary: 66 mg/dL — ABNORMAL LOW (ref 70–99)

## 2020-12-27 MED ORDER — DEXTROSE 50 % IV SOLN
INTRAVENOUS | Status: AC
  Administered 2020-12-27: 50 mL
  Filled 2020-12-27: qty 50

## 2020-12-27 MED ORDER — HYDROCORTISONE 1 % EX CREA
TOPICAL_CREAM | Freq: Two times a day (BID) | CUTANEOUS | Status: DC | PRN
  Filled 2020-12-27: qty 28

## 2020-12-27 NOTE — Progress Notes (Addendum)
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 Patient was then admitted to the hospital for further evaluation and treatment.  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 failure   History of simultaneous kidney and pancreas transplant ([place])   Chronic kidney disease (CKD) stage G3a/A1, moderately decreased glomerular filtration rate (GFR) between 45-59 mL/min/1.73 square meter and albuminuria creatinine ratio less than 30 mg/g (HCC)   Hypertension   Acute kidney injury superimposed on CKD (HCC)   Type 1 diabetes mellitus with complication, with long term current use of insulin pump (HCC)   Pressure injury of skin   Long-term use of immunosuppressant medication   Sepsis ([place])   Hypoalbuminemia   Hyponatremia   CAP (community acquired pneumonia)   Acute urinary retention   Acute metabolic encephalopathy   Sacral decubitus ulcer, stage III (HCC)   Cellulitis   Hypotension   Malodorous urine   Erosive esophagitis   Hypothyroid   Depression   Type 1 diabetes mellitus with hyperlipidemia (HCC)   Malnutrition of moderate degree  Acute metabolic encephalopathy secondary to infected sacral ulcer  Encephalopathy resolved at this time.  Continue wound dressing.  Completed 7-day course of antibioticfor infected ulcer but was resumed on  antibiotic for new fever  New fever and hypotension.  Likely secondary to pneumonia.  Chest x-ray with infiltrate.  On Zosyn with improvement in symptoms overall at this time blood pressure has improved.  Cellulitis of the unstageable sacral decubitus ulcer CT of the pelvis did not show any osteomyelitis. Completed 7-day course of antibiotic for infected decubitus ulcer.  Back on Zosyn for possible pneumonia.  Acute urinary retention with bacteriuria Resolved.  AKI on stage IIIa CKD s/p renal transplant Baseline creatinine around 1.2-1.3. Continue Imuran, prednisone and cyclosporine.  Check BMP in a.m.  Constipation, impacted stool Disimpaction done during hospitalization.  Continue stool softeners.    Type I diabetic s/p pancreas transplant, poorly controlled with episodes of hypoglycemia Hemoglobin A1c at 7.2. on decreased dose of lantus to 8 units twice daily, sliding scale insulin.  Closely monitor blood glucose levels.  Latest PSA glucose of 177.  S/p BKA for subacute osteomyelitis Supportive care  History of erosive esophagitis Stable, continue with PPI twice daily.  Hypothyroidism Continue Synthroid  Hyperlipidemia Continue Crestor  Thrombocytopenia Resolved.   Depression Continue with Zoloft.  Moderate protein calorie malnutrition.   Encourage oral intake, continue nutritional supplements  Pressure injury present on admission.  Continue wound care  Pressure Injury 12/10/20 Heel Left;Posterior Stage 2 -  Partial thickness loss of dermis presenting as a shallow open injury with a red, pink wound bed without slough. (Active)  12/10/20 1325  Location: Heel  Location Orientation: Left;Posterior  Staging: Stage 2 -  Partial thickness loss of dermis presenting as a shallow open injury with a red, pink wound bed without slough.  Wound Description (Comments):   Present on Admission: Yes (  upon transfer to 1402)     Pressure Injury 12/24/20 Sacrum Stage 4  - Full thickness tissue loss with exposed bone, tendon or muscle. (Active)  12/24/20   Location: Sacrum  Location Orientation:   Staging: Stage 4 - Full thickness tissue loss with exposed bone, tendon or muscle.  Wound Description (Comments):   Present on Admission: Yes    DVT prophylaxis: SCDs Start: 12/08/20 0053   Code Status: Full code  Family Communication:  None.    Status is: Inpatient  Remains inpatient appropriate because:Unsafe d/c plan and Awaiting for placement   Dispo: The patient is from: Home              Anticipated d/c is to: Awaiting for placement              Anticipated d/c date is: 1 to 2 days.              Patient currently is medically stable to d/c.  Consultants:  Wound care  Procedures:  Wound care  Antibiotics:  . None currently   Subjective: Today, patient was seen and examined at bedside.  Complains of mild back pain but no nausea vomiting or shortness of breath.  Overall feels better  Objective: Vitals:   12/26/20 2045 12/27/20 0452  BP: 137/75 99/60  Pulse: 81 70  Resp: 18 16  Temp: 98.6 F (37 C) 98.1 F (36.7 C)  SpO2: 97% 100%    Intake/Output Summary (Last 24 hours) at 12/27/2020 0827 Last data filed at 12/27/2020 0451 Gross per 24 hour  Intake -  Output 2175 ml  Net -2175 ml   Filed Weights   12/25/20 0538 12/26/20 0500 12/27/20 0452  Weight: 74 kg 74 kg 74 kg   Body mass index is 28.9 kg/m.   Physical Exam:  General: Thinly built, not in obvious distress, alert awake and communicative HENT:   No scleral pallor or icterus noted. Oral mucosa is moist.  Chest:    Diminished breath sounds bilaterally.  CVS: S1 &S2 heard. No murmur.  Regular rate and rhythm. Abdomen: Soft, nontender, nondistended.  Bowel sounds are heard.   Extremities: Right below-knee amputation with healthy stump , thin extremities, mild edema on the left psych: Alert, awake and communicative,, normal mood CNS:  No cranial nerve deficits.   Power equal in all extremities.   Skin: Warm and dry.  Sacral decubitus ulceration present on admission.  Data Review: I have personally reviewed the following laboratory data and studies,  CBC: Recent Labs  Lab 12/22/20 0429  WBC 3.5*  HGB 8.0*  HCT 25.6*  MCV 109.9*  PLT 517   Basic Metabolic Panel: Recent Labs  Lab 12/22/20 0429  NA 137  K 5.1  CL 104  CO2 23  GLUCOSE 347*  BUN 59*  CREATININE 1.10*  CALCIUM 9.3   Liver Function Tests: No results for input(s): AST, ALT, ALKPHOS, BILITOT, PROT, ALBUMIN in the last 168 hours. No results for input(s): LIPASE, AMYLASE in the last 168 hours. No results for input(s): AMMONIA in the last 168 hours. Cardiac Enzymes: No results for input(s): CKTOTAL, CKMB, CKMBINDEX, TROPONINI in the last 168 hours. BNP (last 3 results) No results for input(s): BNP in the last 8760 hours.  ProBNP (last 3 results) No results for input(s): PROBNP in the last 8760 hours.  CBG: Recent Labs  Lab 12/25/20 2131 12/26/20 0754 12/26/20 1156 12/26/20 1621 12/27/20 0730  GLUCAP 211* 170* 120* 200* 177*   Recent Results (  from the past 240 hour(s))  SARS CORONAVIRUS 2 (TAT 6-24 HRS) Nasopharyngeal Nasopharyngeal Swab     Status: None   Collection Time: 12/23/20 12:59 PM   Specimen: Nasopharyngeal Swab  Result Value Ref Range Status   SARS Coronavirus 2 NEGATIVE NEGATIVE Final    Comment: (NOTE) SARS-CoV-2 target nucleic acids are NOT DETECTED.  The SARS-CoV-2 RNA is generally detectable in upper and lower respiratory specimens during the acute phase of infection. Negative results do not preclude SARS-CoV-2 infection, do not rule out co-infections with other pathogens, and should not be used as the sole basis for treatment or other patient management decisions. Negative results must be combined with clinical observations, patient history, and epidemiological information. The expected result is Negative.  Fact Sheet for  Patients: SugarRoll.be  Fact Sheet for Healthcare Providers: https://www.woods-mathews.com/  This test is not yet approved or cleared by the [place] FDA and  has been authorized for detection and/or diagnosis of SARS-CoV-2 by FDA under an Emergency Use Authorization (EUA). This EUA will remain  in effect (meaning this test can be used) for the duration of the COVID-19 declaration under Se ction 564(b)(1) of the Act, 21 U.S.C. section 360bbb-3(b)(1), unless the authorization is terminated or revoked sooner.  Performed at [facility], [place] [address], [place], [place] [address]   Culture, blood (Routine X 2) w Reflex to ID Panel     Status: None (Preliminary result)   Collection Time: 12/25/20  1:26 PM   Specimen: BLOOD LEFT HAND  Result Value Ref Range Status   Specimen Description   Final    BLOOD LEFT HAND Performed at [facility] [address], [place], [place] [address]    Special Requests   Final    BOTTLES DRAWN AEROBIC ONLY Blood Culture adequate volume Performed at [facility] [address] [place], [place] [address]    Culture   Final    NO GROWTH < 24 HOURS Performed at [facility] [address], [place], [place] [address]    Report Status PENDING  Incomplete  Culture, blood (Routine X 2) w Reflex to ID Panel     Status: None (Preliminary result)   Collection Time: 12/25/20  1:26 PM   Specimen: BLOOD RIGHT HAND  Result Value Ref Range Status   Specimen Description   Final    BLOOD RIGHT HAND Performed at [facility] [address] [place], [place] [address]    Special Requests   Final    BOTTLES DRAWN AEROBIC ONLY Blood Culture adequate volume Performed at [facility] [address] [place], [place] [address]    Culture   Final    NO GROWTH < 24 HOURS Performed at [facility] [address],  [place], [place] [address]    Report Status PENDING  Incomplete     Studies: DG CHEST PORT 1 VIEW  Result Date: 12/25/2020 CLINICAL DATA:  Infected sacral decubitus ulcer. EXAM: PORTABLE CHEST 1 VIEW COMPARISON:  12/07/2020 FINDINGS: Poor inspiration with increased patchy and linear density at both lung bases, left greater than right. The remainder of the lungs are clear with normal vascularity. Normal sized heart. Left axillary surgical clips. Moderate left and mild right glenohumeral joint degenerative changes. IMPRESSION: Poor inspiration with bibasilar atelectasis and possible pneumonia, left greater than right. Electronically Signed   By: Claudie Revering M.D.   On: 12/25/2020 13:22      Meric Joye  Shanessa Hodak, MD  [facility] 12/27/2020  If 7PM-7AM, please contact night-coverage

## 2020-12-27 NOTE — TOC Progression Note (Signed)
Transition of Care [facility]) - Progression Note    Patient Details  Name: [name] MRN: [id number] Date of Birth: 1961-01-23  Transition of [facility]) CM/SW Contact  Lennart Pall, LCSW Phone Number: 12/27/2020, 12:53 PM  Clinical Narrative:    Met with pt today to follow up on dc plans per CM notes from yesterday.  Pt reporting that she and spouse discussed SNF vs HH plans and are now agreed to stick with plan for SNF.  They have accepted bed offer from [facility].  I have left a VM with the admissions coordinator, Fraser Din [phone]) and await return call.  Facility will need to start insurance authorization.  TOC to continue to follow and keep pt/spouse/ treatment team posted on progress.   Expected Discharge Plan: [place] Barriers to Discharge: Continued Medical Work up  Expected Discharge Plan and Services Expected Discharge Plan: [place]   Discharge Planning Services: CM Consult Post Acute Care Choice: [employer] Living arrangements for the past 2 months: Single Family Home                                       Social Determinants of Health (SDOH) Interventions    Readmission Risk Interventions No flowsheet data found.

## 2020-12-27 NOTE — Progress Notes (Signed)
Hypoglycemic Event  CBG: 61  Treatment: orange juice, graham crackers  Symptoms: none  Follow-up CBG: Time:1700 CBG Result:66  Amp D50 administered, recheck CBG 141    Possible Reasons for Event: unknown   Comments/MD notified: Dr. Glennon Hamilton, Rema Jasmine

## 2020-12-28 DIAGNOSIS — G9341 Metabolic encephalopathy: Secondary | ICD-10-CM | POA: Diagnosis not present

## 2020-12-28 DIAGNOSIS — R338 Other retention of urine: Secondary | ICD-10-CM | POA: Diagnosis not present

## 2020-12-28 DIAGNOSIS — N189 Chronic kidney disease, unspecified: Secondary | ICD-10-CM | POA: Diagnosis not present

## 2020-12-28 DIAGNOSIS — N179 Acute kidney failure, unspecified: Secondary | ICD-10-CM | POA: Diagnosis not present

## 2020-12-28 LAB — BASIC METABOLIC PANEL
Anion gap: 10 (ref 5–15)
BUN: 46 mg/dL — ABNORMAL HIGH (ref 8–23)
CO2: 24 mmol/L (ref 22–32)
Calcium: 9.4 mg/dL (ref 8.9–10.3)
Chloride: 104 mmol/L (ref 98–111)
Creatinine, Ser: 0.96 mg/dL (ref 0.44–1.00)
GFR, Estimated: >60 mL/min (ref >60)
Glucose, Bld: 119 mg/dL — ABNORMAL HIGH (ref 70–99)
Potassium: 5 mmol/L (ref 3.5–5.1)
Sodium: 138 mmol/L (ref 135–145)

## 2020-12-28 LAB — CBC
HCT: 27.7 % — ABNORMAL LOW (ref 36.0–46.0)
Hemoglobin: 8.9 g/dL — ABNORMAL LOW (ref 12.0–15.0)
MCH: 35.9 pg — ABNORMAL HIGH (ref 26.0–34.0)
MCHC: 32.1 g/dL (ref 30.0–36.0)
MCV: 111.7 fL — ABNORMAL HIGH (ref 80.0–100.0)
Platelets: 245 10*3/uL (ref 150–400)
RBC: 2.48 MIL/uL — ABNORMAL LOW (ref 3.87–5.11)
RDW: 24.9 % — ABNORMAL HIGH (ref 11.5–15.5)
WBC: 3.6 10*3/uL — ABNORMAL LOW (ref 4.0–10.5)
nRBC: 0 % (ref 0.0–0.2)

## 2020-12-28 LAB — GLUCOSE, CAPILLARY
Glucose-Capillary: 95 mg/dL (ref 70–99)
Glucose-Capillary: 87 mg/dL (ref 70–99)
Glucose-Capillary: 51 mg/dL — ABNORMAL LOW (ref 70–99)
Glucose-Capillary: 67 mg/dL — ABNORMAL LOW (ref 70–99)
Glucose-Capillary: 81 mg/dL (ref 70–99)
Glucose-Capillary: 96 mg/dL (ref 70–99)

## 2020-12-28 MED ORDER — INSULIN GLARGINE 100 UNIT/ML ~~LOC~~ SOLN
6.0000 [IU] | Freq: Two times a day (BID) | SUBCUTANEOUS | Status: DC
  Administered 2020-12-29: 6 [IU] via SUBCUTANEOUS
  Filled 2020-12-28 (×2): qty 0.06

## 2020-12-28 NOTE — Progress Notes (Signed)
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 Patient was then admitted to the hospital for further evaluation and treatment.  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 failure   History of simultaneous kidney and pancreas transplant ([place])   Chronic kidney disease (CKD) stage G3a/A1, moderately decreased glomerular filtration rate (GFR) between 45-59 mL/min/1.73 square meter and albuminuria creatinine ratio less than 30 mg/g (HCC)   Hypertension   Acute kidney injury superimposed on CKD (HCC)   Type 1 diabetes mellitus with complication, with long term current use of insulin pump (HCC)   Pressure injury of skin   Long-term use of immunosuppressant medication   Sepsis ([place])   Hypoalbuminemia   Hyponatremia   CAP (community acquired pneumonia)   Acute urinary retention   Acute metabolic encephalopathy   Sacral decubitus ulcer, stage III (HCC)   Cellulitis   Hypotension   Malodorous urine   Erosive esophagitis   Hypothyroid   Depression   Type 1 diabetes mellitus with hyperlipidemia (HCC)   Malnutrition of moderate degree  Acute metabolic encephalopathy secondary to infected sacral ulcer  Resolved at this time   New fever and hypotension.  Improved, likely secondary to pneumonia.  Chest x-ray with infiltrate.  Currently on  Zosyn.  Continue to complete a 5-day course.  Cellulitis of the unstageable sacral decubitus ulcer CT of the pelvis did not show any osteomyelitis. Completed 7-day course of antibiotic for infected decubitus ulcer.  Back on Zosyn for possible pneumonia.  Acute urinary retention with bacteriuria Resolved.  AKI on stage IIIa CKD s/p renal transplant Baseline creatinine around 1.2-1.3. Continue Imuran, prednisone and cyclosporine.  Creatinine of 0.9 at this time  Constipation, impacted stool Disimpaction done during hospitalization.  Continue stool softeners to ensure regular bowel movements  Type I diabetic s/p pancreas transplant, poorly controlled with episodes of hypoglycemia Hemoglobin A1c at 7.2. on decreased dose of lantus to 8 units twice daily, sliding scale insulin.  Closely monitor blood glucose levels.  Latest PSA glucose of 95.  S/p BKA for subacute osteomyelitis Supportive care  History of erosive esophagitis Stable, continue with PPI twice daily.  Hypothyroidism Continue Synthroid  Hyperlipidemia Continue Crestor  Thrombocytopenia Resolved.   Depression Continue with Zoloft.  Moderate protein calorie malnutrition.   Encourage oral intake, continue nutritional supplements  Pressure injury present on admission.  Continue wound care  Pressure Injury 12/10/20 Heel Left;Posterior Stage 2 -  Partial thickness loss of dermis presenting as a shallow open injury with a red, pink wound bed without slough. (Active)  12/10/20 1325  Location: Heel  Location Orientation: Left;Posterior  Staging: Stage 2 -  Partial thickness loss of dermis presenting as a shallow open injury with a red, pink wound bed without slough.  Wound Description (Comments):   Present on Admission: Yes (upon transfer to 1402)     Pressure Injury 12/24/20 Sacrum Stage 4 - Full thickness tissue  loss with exposed bone, tendon or muscle. (Active)  12/24/20   Location: Sacrum  Location  Orientation:   Staging: Stage 4 - Full thickness tissue loss with exposed bone, tendon or muscle.  Wound Description (Comments):   Present on Admission: Yes    DVT prophylaxis: SCDs Start: 12/08/20 0053   Code Status: Full code  Family Communication:  None.    Status is: Inpatient  Remains inpatient appropriate because:Unsafe d/c plan and Awaiting for placement   Dispo: The patient is from: Home              Anticipated d/c is to: Awaiting for placement              Anticipated d/c date is: 1 to 2 days.              Patient currently is medically stable to d/c.  Consultants:  Wound care  Procedures:  Wound care  Antibiotics:  . None currently   Subjective: Today, patient was seen and examined at bedside .  Patient denies any nausea vomiting fever chills today.  Complains of mild back discomfort.  Objective: Vitals:   12/27/20 1224 12/27/20 2153  BP: 114/68 130/84  Pulse: 63 79  Resp: 14 18  Temp: (!) 96.5 F (35.8 C) 97.9 F (36.6 C)  SpO2: 100% 98%    Intake/Output Summary (Last 24 hours) at 12/28/2020 0803 Last data filed at 12/28/2020 0600 Gross per 24 hour  Intake 601.88 ml  Output 1650 ml  Net -1048.12 ml   Filed Weights   12/27/20 0452 12/27/20 2153 12/28/20 0500  Weight: 74 kg 78 kg 78 kg   Body mass index is 30.46 kg/m.   Physical Exam: General:  not in obvious distress, alert awake and communicative HENT:   No scleral pallor or icterus noted. Oral mucosa is moist.  Chest:    Diminished breath sounds bilaterally.  No crackles or wheezing CVS: S1 &S2 heard. No murmur.  Regular rate and rhythm. Abdomen: Soft, nontender, nondistended.  Bowel sounds are heard.   Extremities: Right below-knee amputation with healthy stump , thin extremities, mild edema on the left psych: Alert, awake and communicative,, normal mood CNS:  No cranial nerve deficits.  Moves extremities Skin: Warm and dry.  Sacral decubitus ulceration present on admission.  Data  Review: I have personally reviewed the following laboratory data and studies,  CBC: Recent Labs  Lab 12/22/20 0429 12/28/20 0527  WBC 3.5* 3.6*  HGB 8.0* 8.9*  HCT 25.6* 27.7*  MCV 109.9* 111.7*  PLT 214 564   Basic Metabolic Panel: Recent Labs  Lab 12/22/20 0429 12/28/20 0527  NA 137 138  K 5.1 5.0  CL 104 104  CO2 23 24  GLUCOSE 347* 119*  BUN 59* 46*  CREATININE 1.10* 0.96  CALCIUM 9.3 9.4   Liver Function Tests: No results for input(s): AST, ALT, ALKPHOS, BILITOT, PROT, ALBUMIN in the last 168 hours. No results for input(s): LIPASE, AMYLASE in the last 168 hours. No results for input(s): AMMONIA in the last 168 hours. Cardiac Enzymes: No results for input(s): CKTOTAL, CKMB, CKMBINDEX, TROPONINI in the last 168 hours. BNP (last 3 results) No results for input(s): BNP in the last 8760 hours.  ProBNP (last 3 results) No results for input(s): PROBNP in the last 8760 hours.  CBG: Recent Labs  Lab 12/27/20 1608 12/27/20 1647 12/27/20 1701 12/27/20 1727 12/28/20 0731  GLUCAP 61* 66* 66* 141* 95   Recent Results (from the past 240  hour(s))  SARS CORONAVIRUS 2 (TAT 6-24 HRS) Nasopharyngeal Nasopharyngeal Swab     Status: None   Collection Time: 12/23/20 12:59 PM   Specimen: Nasopharyngeal Swab  Result Value Ref Range Status   SARS Coronavirus 2 NEGATIVE NEGATIVE Final    Comment: (NOTE) SARS-CoV-2 target nucleic acids are NOT DETECTED.  The SARS-CoV-2 RNA is generally detectable in upper and lower respiratory specimens during the acute phase of infection. Negative results do not preclude SARS-CoV-2 infection, do not rule out co-infections with other pathogens, and should not be used as the sole basis for treatment or other patient management decisions. Negative results must be combined with clinical observations, patient history, and epidemiological information. The expected result is Negative.  Fact Sheet for  Patients: SugarRoll.be  Fact Sheet for Healthcare Providers: https://www.woods-mathews.com/  This test is not yet approved or cleared by the [place] FDA and  has been authorized for detection and/or diagnosis of SARS-CoV-2 by FDA under an Emergency Use Authorization (EUA). This EUA will remain  in effect (meaning this test can be used) for the duration of the COVID-19 declaration under Se ction 564(b)(1) of the Act, 21 U.S.C. section 360bbb-3(b)(1), unless the authorization is terminated or revoked sooner.  Performed at [facility], [place] [address] [place], [place] [address]   Culture, blood (Routine X 2) w Reflex to ID Panel     Status: None (Preliminary result)   Collection Time: 12/25/20  1:26 PM   Specimen: BLOOD LEFT HAND  Result Value Ref Range Status   Specimen Description   Final    BLOOD LEFT HAND Performed at [facility] [address], [place], [place] [address]    Special Requests   Final    BOTTLES DRAWN AEROBIC ONLY Blood Culture adequate volume Performed at [facility] [address] [place], [place] [address]    Culture   Final    NO GROWTH 2 DAYS Performed at [facility] [address] [place], [place] [address]    Report Status PENDING  Incomplete  Culture, blood (Routine X 2) w Reflex to ID Panel     Status: None (Preliminary result)   Collection Time: 12/25/20  1:26 PM   Specimen: BLOOD RIGHT HAND  Result Value Ref Range Status   Specimen Description   Final    BLOOD RIGHT HAND Performed at [facility] [address] [place], [place] [address]    Special Requests   Final    BOTTLES DRAWN AEROBIC ONLY Blood Culture adequate volume Performed at [facility] [address] [place], [place] [address]    Culture   Final    NO GROWTH 2 DAYS Performed at [facility] [address] [place], [place]  [address]    Report Status PENDING  Incomplete     Studies: No results found.    Flora Lipps, MD  [facility] 12/28/2020  If 7PM-7AM, please contact night-coverage

## 2020-12-28 NOTE — Plan of Care (Signed)
  Problem: Clinical Measurements: Goal: Ability to maintain clinical measurements within normal limits will improve Outcome: Progressing   Problem: Safety: Goal: Ability to remain free from injury will improve Outcome: Progressing   Problem: Self-Concept: Goal: Ability to maintain and perform role responsibilities to the fullest extent possible will improve Outcome: Progressing   Problem: Pain Management: Goal: Pain level will decrease with appropriate interventions Outcome: Progressing

## 2020-12-28 NOTE — Progress Notes (Signed)
Hypoglycemic Event  CBG: 51  Treatment: 4 oz juice  Symptoms: none  Follow-up CBG: Time:1706 CBG Result 67  Possible Reasons for Event: Lantus  Comments/MD notified:1630 Dr Kayleen Memos, Darlina Sicilian

## 2020-12-29 DIAGNOSIS — N189 Chronic kidney disease, unspecified: Secondary | ICD-10-CM | POA: Diagnosis not present

## 2020-12-29 DIAGNOSIS — G9341 Metabolic encephalopathy: Secondary | ICD-10-CM | POA: Diagnosis not present

## 2020-12-29 DIAGNOSIS — R338 Other retention of urine: Secondary | ICD-10-CM | POA: Diagnosis not present

## 2020-12-29 DIAGNOSIS — N179 Acute kidney failure, unspecified: Secondary | ICD-10-CM | POA: Diagnosis not present

## 2020-12-29 LAB — GLUCOSE, CAPILLARY
Glucose-Capillary: 90 mg/dL (ref 70–99)
Glucose-Capillary: 152 mg/dL — ABNORMAL HIGH (ref 70–99)
Glucose-Capillary: 132 mg/dL — ABNORMAL HIGH (ref 70–99)
Glucose-Capillary: 190 mg/dL — ABNORMAL HIGH (ref 70–99)

## 2020-12-29 MED ORDER — INSULIN GLARGINE 100 UNIT/ML ~~LOC~~ SOLN
4.0000 [IU] | Freq: Two times a day (BID) | SUBCUTANEOUS | Status: DC
  Administered 2020-12-29 – 2021-01-01 (×5): 4 [IU] via SUBCUTANEOUS
  Filled 2020-12-29 (×7): qty 0.04

## 2020-12-29 MED ORDER — POLYVINYL ALCOHOL 1.4 % OP SOLN
1.0000 [drp] | OPHTHALMIC | Status: DC | PRN
  Filled 2020-12-29: qty 15

## 2020-12-29 NOTE — Progress Notes (Signed)
Inpatient Diabetes Program Recommendations  AACE/ADA: New Consensus Statement on Inpatient Glycemic Control (2015)  Target Ranges:  Prepandial:   less than 140 mg/dL      Peak postprandial:   less than 180 mg/dL (1-2 hours)      Critically ill patients:  140 - 180 mg/dL   Results for [name], [name] (MRN [id number]) as of 12/29/2020 08:36  Ref. Range 12/28/2020 07:31 12/28/2020 11:27 12/28/2020 16:21 12/28/2020 17:06 12/28/2020 18:20 12/28/2020 21:50  Glucose-Capillary Latest Ref Range: 70 - 99 mg/dL 95  8 units LANTUS @9 :30am 87 51 (L) 67 (L) 81 96   Results for [name], [name] (MRN [id number]) as of 12/29/2020 08:36  Ref. Range 12/29/2020 07:50  Glucose-Capillary Latest Ref Range: 70 - 99 mg/dL 90    Admit with: Sepsis secondary to decubitus ulcer versus early pneumonia  History: Type 1 Diabetes (renal and pancreas transplant(1989)  Home DM Meds: Lantus 8 units BID       Humalog 2 units TID  Current Orders: Lantus 6 units BID      Novolog Sensitive Correction Scale/ SSI (0-9 units) TID AC     MD- Note patient with Hypoglycemia yesterday around 4pm.  Had only received 8 units Lantus insulin all day (No Novolog was given yesterday).  CBG only 90 this AM  Please consider reducing Lantus further to 4 units BID     --Will follow patient during hospitalization--  Wyn Quaker RN, MSN, CDE Diabetes Coordinator Inpatient Glycemic Control Team Team Pager: [phone] (8a-5p)

## 2020-12-29 NOTE — Progress Notes (Signed)
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 Patient was then admitted to the hospital for further evaluation and treatment.  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 failure   History of simultaneous kidney and pancreas transplant ([place])   Chronic kidney disease (CKD) stage G3a/A1, moderately decreased glomerular filtration rate (GFR) between 45-59 mL/min/1.73 square meter and albuminuria creatinine ratio less than 30 mg/g (HCC)   Hypertension   Acute kidney injury superimposed on CKD (HCC)   Type 1 diabetes mellitus with complication, with long term current use of insulin pump (HCC)   Pressure injury of skin   Long-term use of immunosuppressant medication   Sepsis ([place])   Hypoalbuminemia   Hyponatremia   CAP (community acquired pneumonia)   Acute urinary retention   Acute metabolic encephalopathy   Sacral decubitus ulcer, stage III (HCC)   Cellulitis   Hypotension   Malodorous urine   Erosive esophagitis   Hypothyroid   Depression   Type 1 diabetes mellitus with hyperlipidemia (HCC)   Malnutrition of moderate degree  Acute metabolic encephalopathy secondary to infected sacral ulcer  Resolved at this time   New fever and hypotension.  Improved, likely secondary to pneumonia.  Chest x-ray with infiltrate.  Currently on  Zosyn.  Continue to complete a 5-day course.  Cellulitis of the unstageable sacral decubitus ulcer CT of the pelvis did not show any osteomyelitis. Completed 7-day course of antibiotic for infected decubitus ulcer.  Back on Zosyn for possible pneumonia.  Acute urinary retention with bacteriuria Resolved.  AKI on stage IIIa CKD s/p renal transplant Baseline creatinine around 1.2-1.3. Continue Imuran, prednisone and cyclosporine.  Creatinine of 0.9 at this time  Constipation, impacted stool Disimpaction done during hospitalization.  Continue stool softeners to ensure regular bowel movements  Type I diabetic s/p pancreas transplant, poorly controlled with episodes of hypoglycemia Hemoglobin A1c at 7.2. on decreased dose of lantus to 6 units twice daily since yesterday.  Patient will need the basal insulin.,  Low-dose sliding scale insulin.  Closely monitor blood glucose levels.  Hypoglycemia has improved.  POC glucose of 95 at this time.  S/p BKA for subacute osteomyelitis Supportive care  History of erosive esophagitis Stable, continue with PPI twice daily.  Hypothyroidism Continue Synthroid  Hyperlipidemia Continue Crestor  Thrombocytopenia Resolved.   Depression Continue with Zoloft.  Moderate protein calorie malnutrition.   Encourage oral intake, continue nutritional supplements  Pressure injury present on admission.  Continue wound care  Pressure Injury 12/10/20 Heel Left;Posterior Stage 2 -  Partial thickness loss of dermis presenting as a shallow open injury with a red, pink wound bed without slough. (Active)  12/10/20 1325  Location: Heel  Location Orientation: Left;Posterior  Staging: Stage 2 -  Partial thickness loss of dermis presenting as a shallow open injury with a red, pink wound bed without slough.  Wound Description (Comments):   Present on Admission: Yes (upon  transfer to 1402)     Pressure Injury 12/24/20 Sacrum Stage 4 - Full thickness  tissue loss with exposed bone, tendon or muscle. (Active)  12/24/20   Location: Sacrum  Location Orientation:   Staging: Stage 4 - Full thickness tissue loss with exposed bone, tendon or muscle.  Wound Description (Comments):   Present on Admission: Yes    DVT prophylaxis: SCDs Start: 12/08/20 0053   Code Status: Full code  Family Communication:  none  Status is: Inpatient  Remains inpatient appropriate because:Unsafe d/c plan and Awaiting for placement   Dispo: The patient is from: Home              Anticipated d/c is to: Awaiting for placement              Anticipated d/c date is: 1 to 2 days.              Patient currently is medically stable to d/c.  Consultants:  Wound care  Procedures:  Wound care  Antibiotics:  . None currently   Subjective: Today, patient was seen and examined at bedside.  hypoglycemic yesterday and Lantus dose was decreased.  Denies any cough, fever, chills or rigor.   Objective: Vitals:   12/28/20 2154 12/29/20 0544  BP: 109/61 119/63  Pulse: 80 78  Resp: 18 18  Temp: 99.6 F (37.6 C) 97.8 F (36.6 C)  SpO2: 95% 95%    Intake/Output Summary (Last 24 hours) at 12/29/2020 0836 Last data filed at 12/29/2020 0545 Gross per 24 hour  Intake 758.58 ml  Output 3525 ml  Net -2766.42 ml   Filed Weights   12/27/20 0452 12/27/20 2153 12/28/20 0500  Weight: 74 kg 78 kg 78 kg   Body mass index is 30.46 kg/m.   Physical Exam: General:  Average built, not in obvious distress, alert awake and communicative HENT:   No scleral pallor or icterus noted. Oral mucosa is moist.  Chest:    Diminished breath sounds bilaterally. No crackles or wheezes.  CVS: S1 &S2 heard. No murmur.  Regular rate and rhythm. Abdomen: Soft, nontender, nondistended.  Bowel sounds are heard.   Extremities: Right below-knee amputation with healthy stump, thin extremities Psych: Alert, awake and communicative,, normal mood CNS:  No cranial nerve deficits.  Moving  all extremities. Skin: Warm and dry.  Sacral decubitus ulceration present on admission   Data Review: I have personally reviewed the following laboratory data and studies,  CBC: Recent Labs  Lab 12/28/20 0527  WBC 3.6*  HGB 8.9*  HCT 27.7*  MCV 111.7*  PLT 960   Basic Metabolic Panel: Recent Labs  Lab 12/28/20 0527  NA 138  K 5.0  CL 104  CO2 24  GLUCOSE 119*  BUN 46*  CREATININE 0.96  CALCIUM 9.4   Liver Function Tests: No results for input(s): AST, ALT, ALKPHOS, BILITOT, PROT, ALBUMIN in the last 168 hours. No results for input(s): LIPASE, AMYLASE in the last 168 hours. No results for input(s): AMMONIA in the last 168 hours. Cardiac Enzymes: No results for input(s): CKTOTAL, CKMB, CKMBINDEX, TROPONINI in the last 168 hours. BNP (last 3 results) No results for input(s): BNP in the last 8760 hours.  ProBNP (last 3 results) No results for input(s): PROBNP in the last 8760 hours.  CBG: Recent Labs  Lab 12/28/20 1621 12/28/20 1706 12/28/20 1820 12/28/20 2150 12/29/20 0750  GLUCAP 51* 67* 81 96 90   Recent Results (from the past 240 hour(s))  SARS CORONAVIRUS  2 (TAT 6-24 HRS) Nasopharyngeal Nasopharyngeal Swab     Status: None   Collection Time: 12/23/20 12:59 PM   Specimen: Nasopharyngeal Swab  Result Value Ref Range Status   SARS Coronavirus 2 NEGATIVE NEGATIVE Final    Comment: (NOTE) SARS-CoV-2 target nucleic acids are NOT DETECTED.  The SARS-CoV-2 RNA is generally detectable in upper and lower respiratory specimens during the acute phase of infection. Negative results do not preclude SARS-CoV-2 infection, do not rule out co-infections with other pathogens, and should not be used as the sole basis for treatment or other patient management decisions. Negative results must be combined with clinical observations, patient history, and epidemiological information. The expected result is Negative.  Fact Sheet for  Patients: SugarRoll.be  Fact Sheet for Healthcare Providers: https://www.woods-mathews.com/  This test is not yet approved or cleared by the [place] FDA and  has been authorized for detection and/or diagnosis of SARS-CoV-2 by FDA under an Emergency Use Authorization (EUA). This EUA will remain  in effect (meaning this test can be used) for the duration of the COVID-19 declaration under Se ction 564(b)(1) of the Act, 21 U.S.C. section 360bbb-3(b)(1), unless the authorization is terminated or revoked sooner.  Performed at [facility], [place] [address], [place], [place] [address]   Culture, blood (Routine X 2) w Reflex to ID Panel     Status: None (Preliminary result)   Collection Time: 12/25/20  1:26 PM   Specimen: BLOOD LEFT HAND  Result Value Ref Range Status   Specimen Description   Final    BLOOD LEFT HAND Performed at [facility] [address], [place], [place] [address]    Special Requests   Final    BOTTLES DRAWN AEROBIC ONLY Blood Culture adequate volume Performed at [facility] [address] [place], [place] [address]    Culture   Final    NO GROWTH 3 DAYS Performed at [facility], [place] [address], [place], [place] [address]    Report Status PENDING  Incomplete  Culture, blood (Routine X 2) w Reflex to ID Panel     Status: None (Preliminary result)   Collection Time: 12/25/20  1:26 PM   Specimen: BLOOD RIGHT HAND  Result Value Ref Range Status   Specimen Description   Final    BLOOD RIGHT HAND Performed at [facility] [address] [place], [place] [address]    Special Requests   Final    BOTTLES DRAWN AEROBIC ONLY Blood Culture adequate volume Performed at [facility] [address] [place], [place] [address]    Culture   Final    NO GROWTH 3 DAYS Performed at [facility], [place] [address] [place], [place]  [address]    Report Status PENDING  Incomplete     Studies: No results found.    Flora Lipps, MD  [facility] 12/29/2020  If 7PM-7AM, please contact night-coverage

## 2020-12-29 NOTE — Plan of Care (Signed)
  Problem: Health Behavior/Discharge Planning: Goal: Ability to manage health-related needs will improve Outcome: Progressing   Problem: Clinical Measurements: Goal: Diagnostic test results will improve Outcome: Progressing   Problem: Activity: Goal: Risk for activity intolerance will decrease Outcome: Progressing   Problem: Nutrition: Goal: Adequate nutrition will be maintained Outcome: Progressing   Problem: Self-Care: Goal: Ability to meet self-care needs will improve Outcome: Progressing   Problem: Pain Management: Goal: Pain level will decrease with appropriate interventions Outcome: Progressing

## 2020-12-30 DIAGNOSIS — R338 Other retention of urine: Secondary | ICD-10-CM | POA: Diagnosis not present

## 2020-12-30 DIAGNOSIS — G9341 Metabolic encephalopathy: Secondary | ICD-10-CM | POA: Diagnosis not present

## 2020-12-30 DIAGNOSIS — N179 Acute kidney failure, unspecified: Secondary | ICD-10-CM | POA: Diagnosis not present

## 2020-12-30 DIAGNOSIS — N189 Chronic kidney disease, unspecified: Secondary | ICD-10-CM | POA: Diagnosis not present

## 2020-12-30 LAB — GLUCOSE, CAPILLARY
Glucose-Capillary: 97 mg/dL (ref 70–99)
Glucose-Capillary: 38 mg/dL — CL (ref 70–99)
Glucose-Capillary: 73 mg/dL (ref 70–99)
Glucose-Capillary: 129 mg/dL — ABNORMAL HIGH (ref 70–99)
Glucose-Capillary: 59 mg/dL — ABNORMAL LOW (ref 70–99)
Glucose-Capillary: 61 mg/dL — ABNORMAL LOW (ref 70–99)
Glucose-Capillary: 153 mg/dL — ABNORMAL HIGH (ref 70–99)
Glucose-Capillary: 170 mg/dL — ABNORMAL HIGH (ref 70–99)

## 2020-12-30 LAB — CULTURE, BLOOD (ROUTINE X 2)
Culture: NO GROWTH
Culture: NO GROWTH
Special Requests: ADEQUATE
Special Requests: ADEQUATE

## 2020-12-30 MED ORDER — DEXTROSE 50 % IV SOLN
INTRAVENOUS | Status: AC
  Administered 2020-12-30: 50 mL
  Filled 2020-12-30: qty 50

## 2020-12-30 MED ORDER — DEXTROSE 50 % IV SOLN
12.5000 g | INTRAVENOUS | Status: AC
  Administered 2020-12-30: 12.5 g via INTRAVENOUS

## 2020-12-30 NOTE — Progress Notes (Signed)
Hypoglycemic Event  CBG: 61  Treatment: 4 oz ginger ale  Symptoms: tired  Follow-up CBG: Time: 2100,  CBG Result:59 Pt stated didn't feel like drinking anymore.  Administered IV dextrose 50% Follow up: 159  Possible Reasons for Event: Pt did not eat dinner   Comments/MD notified: APP B Morrison notified.  Hold 2200 dose of Lantus     Candie Mile

## 2020-12-30 NOTE — Progress Notes (Signed)
PROGRESS NOTE  [name] VCB:[id number] DOB: Jun 05, 1961 DOA: 12/07/2020 PCP: [name], MD   LOS: 23 days   Brief narrative: Ms. 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 Patient was then admitted to the hospital for further evaluation and treatment.  Patient did have a prolonged course in the hospital secondary to infection with ongoing debility need for wound care with hydrotherapy and recurrent infections.  She is weak and debilitated and is currently awaiting for skilled nursing facility placement.  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 failure   History of simultaneous kidney and pancreas transplant ([place])   Chronic kidney disease (CKD) stage G3a/A1, moderately decreased glomerular filtration rate (GFR) between 45-59 mL/min/1.73 square meter and albuminuria creatinine ratio less than 30 mg/g (HCC)   Hypertension   Acute kidney injury superimposed on CKD (HCC)   Type 1 diabetes mellitus with complication, with long term current use of insulin pump (HCC)   Pressure injury of skin   Long-term use of immunosuppressant medication   Sepsis ([place])   Hypoalbuminemia   Hyponatremia   CAP (community acquired pneumonia)   Acute urinary retention   Acute metabolic encephalopathy   Sacral decubitus ulcer, stage III (HCC)   Cellulitis   Hypotension   Malodorous urine   Erosive esophagitis   Hypothyroid   Depression   Type 1 diabetes  mellitus with hyperlipidemia (HCC)   Malnutrition of moderate degree  Acute metabolic encephalopathy secondary to infected sacral ulcer  Resolved  Recent pneumonia with fever and hypotension.  Improving. on IV Zosyn.  Continue to complete a 5-day course.  Cellulitis of the unstageable sacral decubitus ulcer CT of the pelvis did not show any osteomyelitis. Completed 7-day course of antibiotic previously for infected decubitus ulcer.  Currently Zosyn for pneumonia.  Acute urinary retention with bacteriuria Resolved.  AKI on stage IIIa CKD s/p renal transplant Baseline creatinine around 1.2-1.3. Continue Imuran, prednisone and cyclosporine.  Latest creatinine of 0.9 .  Will check BMP in a.m.  Constipation, impacted stool Disimpaction done during hospitalization.  Continue stool softeners to ensure regular bowel movements  Type I diabetic s/p pancreas transplant, poorly controlled with episodes of hypoglycemia Hemoglobin A1c at 7.2. on decreased dose of lantus to 4 units twice daily since yesterday.  Diabetic coordinator on board. Closely monitor blood glucose levels.    S/p BKA for subacute osteomyelitis Supportive care  History of erosive esophagitis Stable, continue with PPI twice daily.  Hypothyroidism Continue Synthroid  Hyperlipidemia Continue Crestor  Thrombocytopenia Resolved.   Depression Continue with Zoloft.  Moderate protein calorie malnutrition.   Encourage oral intake, continue nutritional supplements  Pressure injury present on admission.  Continue wound care.  Initially received hydrotherapy.  Pressure Injury 12/10/20 Heel Left;Posterior Stage 2 -  Partial thickness loss of dermis presenting as a shallow open injury with a red, pink wound bed without slough. (Active)  12/10/20 1325  Location: Heel  Location Orientation: Left;Posterior  Staging: Stage 2 -  Partial thickness loss of dermis presenting as a shallow  open injury with a red, pink  wound bed without slough.  Wound Description (Comments):   Present on Admission: Yes (upon transfer to 1402)     Pressure Injury 12/24/20 Sacrum Stage 4 - Full thickness tissue loss with exposed bone, tendon or muscle. (Active)  12/24/20   Location: Sacrum  Location Orientation:   Staging: Stage 4 - Full thickness tissue loss with exposed bone, tendon or muscle.  Wound Description (Comments):   Present on Admission: Yes    DVT prophylaxis: SCDs Start: 12/08/20 0053   Code Status: Full code  Family Communication:  Unable to reach the patient's spouse on the phone number provided.  Status is: Inpatient  Remains inpatient appropriate because:Unsafe d/c plan and Awaiting for placement   Dispo: The patient is from: Home              Anticipated d/c is to: Awaiting for placement/home health, spoke at the progression rounds.              Anticipated d/c date is: 1 to 2 days.              Patient currently is medically stable to d/c.  Consultants:  Wound care  Procedures:  Wound care  Antibiotics:  . None currently   Subjective: Today, patient was seen and examined at bedside.  Feels frustrated about being in the hospital for a long time.  Denies any nausea vomiting abdominal pain.  Has not been eating well.  Objective: Vitals:   12/29/20 2059 12/30/20 0524  BP: 125/63 116/64  Pulse: 79 75  Resp: 18 18  Temp: 98.6 F (37 C) 97.9 F (36.6 C)  SpO2: 96% 96%    Intake/Output Summary (Last 24 hours) at 12/30/2020 0749 Last data filed at 12/30/2020 0601 Gross per 24 hour  Intake 1179.25 ml  Output 1825 ml  Net -645.75 ml   Filed Weights   12/27/20 2153 12/28/20 0500 12/30/20 0500  Weight: 78 kg 78 kg 74.9 kg   Body mass index is 29.25 kg/m.   Physical Exam:  General:  Average built, not in obvious distress, alert awake and communicative HENT:   No scleral pallor or icterus noted. Oral mucosa is moist.  Chest:  Clear breath sounds.  Diminished breath sounds  bilaterally. No crackles or wheezes.  CVS: S1 &S2 heard. No murmur.  Regular rate and rhythm. Abdomen: Soft, nontender, nondistended.  Bowel sounds are heard.   Extremities: Thin extremities, right below-knee amputation with stump.   Psych: Alert, awake and oriented, normal mood CNS:  No cranial nerve deficits.  Moving extremities. Skin: Warm and dry.  Sacral decubitus ulceration present on admission  Data Review: I have personally reviewed the following laboratory data and studies,  CBC: Recent Labs  Lab 12/28/20 0527  WBC 3.6*  HGB 8.9*  HCT 27.7*  MCV 111.7*  PLT 166   Basic Metabolic Panel: Recent Labs  Lab 12/28/20 0527  NA 138  K 5.0  CL 104  CO2 24  GLUCOSE 119*  BUN 46*  CREATININE 0.96  CALCIUM 9.4   Liver Function Tests: No results for input(s): AST, ALT, ALKPHOS, BILITOT, PROT, ALBUMIN in the last 168 hours. No results for input(s): LIPASE, AMYLASE in the last 168 hours. No results for input(s): AMMONIA in the last 168 hours. Cardiac Enzymes: No results for input(s): CKTOTAL, CKMB, CKMBINDEX, TROPONINI in the last 168 hours. BNP (last 3 results) No results for input(s): BNP in the last 8760 hours.  ProBNP (last  3 results) No results for input(s): PROBNP in the last 8760 hours.  CBG: Recent Labs  Lab 12/28/20 2150 12/29/20 0750 12/29/20 1233 12/29/20 1806 12/29/20 2134  GLUCAP 96 90 152* 132* 190*   Recent Results (from the past 240 hour(s))  SARS CORONAVIRUS 2 (TAT 6-24 HRS) Nasopharyngeal Nasopharyngeal Swab     Status: None   Collection Time: 12/23/20 12:59 PM   Specimen: Nasopharyngeal Swab  Result Value Ref Range Status   SARS Coronavirus 2 NEGATIVE NEGATIVE Final    Comment: (NOTE) SARS-CoV-2 target nucleic acids are NOT DETECTED.  The SARS-CoV-2 RNA is generally detectable in upper and lower respiratory specimens during the acute phase of infection. Negative results do not preclude SARS-CoV-2 infection, do not rule out co-infections  with other pathogens, and should not be used as the sole basis for treatment or other patient management decisions. Negative results must be combined with clinical observations, patient history, and epidemiological information. The expected result is Negative.  Fact Sheet for Patients: SugarRoll.be  Fact Sheet for Healthcare Providers: https://www.woods-mathews.com/  This test is not yet approved or cleared by the [place] FDA and  has been authorized for detection and/or diagnosis of SARS-CoV-2 by FDA under an Emergency Use Authorization (EUA). This EUA will remain  in effect (meaning this test can be used) for the duration of the COVID-19 declaration under Se ction 564(b)(1) of the Act, 21 U.S.C. section 360bbb-3(b)(1), unless the authorization is terminated or revoked sooner.  Performed at [facility], [place] [address] [place], [place] [address]   Culture, blood (Routine X 2) w Reflex to ID Panel     Status: None   Collection Time: 12/25/20  1:26 PM   Specimen: BLOOD LEFT HAND  Result Value Ref Range Status   Specimen Description   Final    BLOOD LEFT HAND Performed at [facility] [address] [place], [place] [address]    Special Requests   Final    BOTTLES DRAWN AEROBIC ONLY Blood Culture adequate volume Performed at [facility] [address] [place], [place] [address]    Culture   Final    NO GROWTH 5 DAYS Performed at [facility], [place] [address], [place], [place] [address]    Report Status 12/30/2020 FINAL  Final  Culture, blood (Routine X 2) w Reflex to ID Panel     Status: None   Collection Time: 12/25/20  1:26 PM   Specimen: BLOOD RIGHT HAND  Result Value Ref Range Status   Specimen Description   Final    BLOOD RIGHT HAND Performed at [facility] [address], [place], [place] [address]    Special Requests   Final    BOTTLES DRAWN  AEROBIC ONLY Blood Culture adequate volume Performed at [facility] [address], [name], [place] [address]    Culture   Final    NO GROWTH 5 DAYS Performed at [facility], [place] [address] [place], [place] [address]    Report Status 12/30/2020 FINAL  Final     Studies: No results found.    Flora Lipps, MD  [facility] 12/30/2020  If 7PM-7AM, please contact night-coverage

## 2020-12-30 NOTE — Plan of Care (Signed)
  Problem: Self-Concept: Goal: Ability to maintain and perform role responsibilities to the fullest extent possible will improve Outcome: Progressing   Problem: Pain Management: Goal: Pain level will decrease with appropriate interventions Outcome: Progressing   Problem: Clinical Measurements: Goal: Ability to maintain clinical measurements within normal limits will improve Outcome: Not Progressing   Problem: Nutrition: Goal: Adequate nutrition will be maintained Outcome: Not Progressing

## 2020-12-30 NOTE — Plan of Care (Signed)
  Problem: Health Behavior/Discharge Planning: Goal: Ability to manage health-related needs will improve Outcome: Progressing   Problem: Clinical Measurements: Goal: Will remain free from infection Outcome: Progressing   Problem: Nutrition: Goal: Adequate nutrition will be maintained Outcome: Progressing   Problem: Skin Integrity: Goal: Risk for impaired skin integrity will decrease Outcome: Progressing   Problem: Activity: Goal: Ability to perform//tolerate increased activity and mobilize with assistive devices will improve Outcome: Progressing   Problem: Pain Management: Goal: Pain level will decrease with appropriate interventions Outcome: Progressing

## 2020-12-30 NOTE — TOC Progression Note (Addendum)
Transition of Care [facility]) - Progression Note    Patient Details  Name: Brynnlie Unterreiner MRN: [id number] Date of Birth: 1961/02/23  Transition of Care [facility]) CM/SW Contact  Montell Leopard, Juliann Pulse, RN Phone Number: 12/30/2020, 2:09 PM  Clinical Narrative:  Milana Obey private duty rep Ebony Hail will discuss the cost of wound care to patient directly.CM will await outcome. No long term iv abx needed. 3:49p-Patient will discuss w/spouse about cost of private duty services from [facility];Unable to offer LOG since patient has health insurance.Will await outcome. Wound care otpt can be set up if MD agree.    Expected Discharge Plan: Home/Self Care Barriers to Discharge: No [facility] will accept this patient  Expected Discharge Plan and Services Expected Discharge Plan: Home/Self Care   Discharge Planning Services: CM Consult Post Acute Care Choice: [place] arrangements for the past 2 months: Single Family Home                               Date [place]: 12/30/20 Time [place]: 1540     Social Determinants of Health (SDOH) Interventions    Readmission Risk Interventions No flowsheet data found.

## 2020-12-30 NOTE — TOC Progression Note (Signed)
Transition of Care [facility]) - Progression Note    Patient Details  Name: [name] MRN: [id number] Date of Birth: [birth year]  [facility]) CM/SW Contact  Samah Lapiana, Juliann Pulse, RN Phone Number: 12/30/2020, 10:26 AM  Clinical Narrative: Patient declines SNF @ [facility] where she came from. No other SNF will accept;Patient agrees to home. Will check with [place] agency to accept.      Expected Discharge Plan: [place] Barriers to Discharge: Continued Medical Work up  Expected Discharge Plan and Services Expected Discharge Plan: [place]   Discharge Planning Services: CM Consult Post Acute Care Choice: [place] arrangements for the past 2 months: Single Family Home                                       Social Determinants of Health (SDOH) Interventions    Readmission Risk Interventions No flowsheet data found.

## 2020-12-30 NOTE — Progress Notes (Signed)
Hypoglycemic Event FYI: [name]'s blood glucose (BG) was 38 at 1138 am. She was asymptomatic. There was a mishap with her breakfast (she is a picky eater). She was given 8 ounces of OJ and food/lunch. BG was rechecked at 1200, resulting in a BG of 72.  She was also still eating lunch at 1200.  She appears and reports to feel just fine.    Payton Spark

## 2020-12-30 NOTE — TOC Progression Note (Signed)
Transition of Care [facility]) - Progression Note    Patient Details  Name: [name] MRN: [id number] Date of Birth: 12-22-1961  [facility] CM/SW Contact  Quinley Nesler, Juliann Pulse, RN Phone Number: 12/30/2020, 12:08 PM  Clinical Narrative: Patient has been declined LTACH per insurance;Came from SNF-[facility]-can return but she declines. HHC unable to accept:[facility]. No HHC to accept-Management notified.     Expected Discharge Plan: Home/Self Care Barriers to Discharge: No [place] will accept this patient  Expected Discharge Plan and Services Expected Discharge Plan: Home/Self Care   Discharge Planning Services: CM Consult Post Acute Care Choice: [place] arrangements for the past 2 months: Single Family Home                                       Social Determinants of Health (SDOH) Interventions    Readmission Risk Interventions No flowsheet data found.

## 2020-12-30 NOTE — Consult Note (Signed)
CM requsted update for wound care orders last placed per [facility] 12/25/20  1. Clean sacral wound with saline; pat dry 2. Apply 1 sheet of Aquacel packing to sacrum wound Q day, using swab to tuck underneath wound edges and fill space below skin level which surround the wound.  3. Apply Desitin to outer red wound edges, then cover with foam dressing. 4. OK to lift foam to repack daily and Change foam dressings Q 3 days or PRN soiling  Needs pressure redistribution mattress at home for use.   [place], [place], [place]

## 2020-12-31 ENCOUNTER — Inpatient Hospital Stay (HOSPITAL_COMMUNITY): Payer: BC Managed Care – PPO

## 2020-12-31 DIAGNOSIS — L89154 Pressure ulcer of sacral region, stage 4: Secondary | ICD-10-CM

## 2020-12-31 DIAGNOSIS — N1831 Chronic kidney disease, stage 3a: Secondary | ICD-10-CM | POA: Diagnosis not present

## 2020-12-31 DIAGNOSIS — K2210 Ulcer of esophagus without bleeding: Secondary | ICD-10-CM | POA: Diagnosis not present

## 2020-12-31 DIAGNOSIS — T8789 Other complications of amputation stump: Secondary | ICD-10-CM | POA: Diagnosis not present

## 2020-12-31 DIAGNOSIS — Z940 Kidney transplant status: Secondary | ICD-10-CM | POA: Diagnosis not present

## 2020-12-31 DIAGNOSIS — I10 Essential (primary) hypertension: Secondary | ICD-10-CM | POA: Diagnosis not present

## 2020-12-31 DIAGNOSIS — R627 Adult failure to thrive: Secondary | ICD-10-CM

## 2020-12-31 DIAGNOSIS — Z79899 Other long term (current) drug therapy: Secondary | ICD-10-CM | POA: Diagnosis not present

## 2020-12-31 DIAGNOSIS — L89899 Pressure ulcer of other site, unspecified stage: Secondary | ICD-10-CM

## 2020-12-31 LAB — BASIC METABOLIC PANEL
Anion gap: 10 (ref 5–15)
BUN: 59 mg/dL — ABNORMAL HIGH (ref 8–23)
CO2: 27 mmol/L (ref 22–32)
Calcium: 9.5 mg/dL (ref 8.9–10.3)
Chloride: 101 mmol/L (ref 98–111)
Creatinine, Ser: 1.05 mg/dL — ABNORMAL HIGH (ref 0.44–1.00)
GFR, Estimated: >60 mL/min (ref >60)
Glucose, Bld: 140 mg/dL — ABNORMAL HIGH (ref 70–99)
Potassium: 4.7 mmol/L (ref 3.5–5.1)
Sodium: 138 mmol/L (ref 135–145)

## 2020-12-31 LAB — CBC
HCT: 26.2 % — ABNORMAL LOW (ref 36.0–46.0)
Hemoglobin: 8.2 g/dL — ABNORMAL LOW (ref 12.0–15.0)
MCH: 35.2 pg — ABNORMAL HIGH (ref 26.0–34.0)
MCHC: 31.3 g/dL (ref 30.0–36.0)
MCV: 112.4 fL — ABNORMAL HIGH (ref 80.0–100.0)
Platelets: 311 10*3/uL (ref 150–400)
RBC: 2.33 MIL/uL — ABNORMAL LOW (ref 3.87–5.11)
RDW: 25 % — ABNORMAL HIGH (ref 11.5–15.5)
WBC: 5 10*3/uL (ref 4.0–10.5)
nRBC: 0 % (ref 0.0–0.2)

## 2020-12-31 LAB — GLUCOSE, CAPILLARY
Glucose-Capillary: 83 mg/dL (ref 70–99)
Glucose-Capillary: 115 mg/dL — ABNORMAL HIGH (ref 70–99)
Glucose-Capillary: 193 mg/dL — ABNORMAL HIGH (ref 70–99)
Glucose-Capillary: 205 mg/dL — ABNORMAL HIGH (ref 70–99)
Glucose-Capillary: 152 mg/dL — ABNORMAL HIGH (ref 70–99)

## 2020-12-31 LAB — MAGNESIUM: Magnesium: 2 mg/dL (ref 1.7–2.4)

## 2020-12-31 MED ORDER — INSULIN ASPART 100 UNIT/ML ~~LOC~~ SOLN: 0.0000 [IU] | Freq: Three times a day (TID) | SUBCUTANEOUS | Status: DC

## 2020-12-31 MED ORDER — GADOBUTROL 1 MMOL/ML IV SOLN
7.0000 mL | Freq: Once | INTRAVENOUS | Status: AC | PRN
  Administered 2020-12-31: 7 mL via INTRAVENOUS

## 2020-12-31 MED ORDER — INSULIN ASPART 100 UNIT/ML ~~LOC~~ SOLN
0.0000 [IU] | Freq: Three times a day (TID) | SUBCUTANEOUS | Status: DC
  Administered 2020-12-31 – 2021-01-02 (×2): 2 [IU] via SUBCUTANEOUS

## 2020-12-31 NOTE — Consult Note (Signed)
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  12/12 bcx negative 12/12 catheterized urine cx with yeast  I discussed with patient at length regarding the underlying pathogenesis/natural history of sacral decubitus ulcer. She doesn't appear to have good insight of the problem however.  For unreversable cause of decub ulcer (severe debility with spinal cord injury for example), the patient will be at risk for recurrent or nonhealing ulcer and thus recurrent osteomyelitis despite appropriate treatment (which would be surgical I&D where indicated, appropriate targeted antibiotics for 6 weeks, and coverage of the wound partially into antibiotics treatment course). This would put patient at risk for MDRO and abx toxicity. In these cases, literature suggest treatment for sepsis with 1-2 week course of abx only and appropriate abscess drainage if present.  In her case, she has slightly better chance of the ulcer healing however I am still pessimistic given her progressive LE strength coming into 09/2020, and since s/p bka and has been bed-bound without any improvement at all. She is adamant  about getting diagnosed and treated if OM present  Will get mri to see if imaging suggestion of om. If there is, would get IR to biopsy for targeted therapy. Would also discuss case with plastic surgery once we confirm OM diagnosis via bone biopsy/cx, to at least plan for flap coverage if nonhealing ulcer   Of note, she has foley catheter, putting her at risk for uti/ascending pyelo. If no urine retention, would use WIC. Would trial off foley and see  Plan: 1. Crp/esr tomorrow 2. Mri pelvis 3. Depending on mri finding will see if we can pursue bone biopsy for targeted therapy 4. Please trial off foley and check pvr to see if foley catheter needed 5. Consider WIC if no urine retention 6. Will get xray of the right bka r/o om there too in setting of what appears to be chronic decub ulcer as well  Active Problems:   Anemia secondary to renal failure   History of simultaneous kidney and pancreas transplant ([place])   Chronic kidney disease (CKD) stage G3a/A1, moderately decreased glomerular filtration rate (GFR) between 45-59 mL/min/1.73 square meter and albuminuria creatinine ratio less than 30 mg/g (HCC)   Hypertension   Acute kidney injury superimposed on CKD (HCC)   Type 1 diabetes mellitus with complication, with long term current use of insulin pump (HCC)   Pressure injury of skin   Long-term use of immunosuppressant medication   Sepsis ([place])   Hypoalbuminemia   Hyponatremia   CAP (community acquired pneumonia)   Acute urinary retention   Acute metabolic encephalopathy   Sacral decubitus ulcer, stage III (HCC)   Cellulitis  Hypotension   Malodorous urine   Erosive esophagitis   Hypothyroid   Depression   Type 1 diabetes mellitus with hyperlipidemia (HCC)   Malnutrition of moderate degree   Scheduled Meds: . azaTHIOprine  50 mg Oral Daily  . Chlorhexidine Gluconate Cloth  6 each Topical Daily  . cycloSPORINE  75 mg Oral BID  . feeding supplement  237 mL Oral BID BM  .  folic acid  1 mg Oral Daily  . insulin aspart  0-6 Units Subcutaneous TID WC  . insulin glargine  4 Units Subcutaneous BID  . levothyroxine  50 mcg Oral Q0600  . liver oil-zinc oxide   Topical Daily  . mouth rinse  15 mL Mouth Rinse BID  . metoprolol tartrate  12.5 mg Oral BID  . multivitamin with minerals  1 tablet Oral Daily  . nutrition supplement (JUVEN)  1 packet Oral BID BM  . nystatin   Topical TID  . pantoprazole  40 mg Oral BID  . polyethylene glycol  17 g Oral Daily  . rosuvastatin  5 mg Oral QHS  . senna-docusate  2 tablet Oral BID  . sertraline  25 mg Oral Daily  . sodium bicarbonate  650 mg Oral BID  . vitamin B-12  100 mcg Oral Daily   Continuous Infusions: . sodium chloride 250 mL (12/27/20 0514)   PRN Meds:.sodium chloride, acetaminophen **OR** acetaminophen, HYDROcodone-acetaminophen, hydrocortisone cream, ondansetron (ZOFRAN) IV, polyvinyl alcohol  HPI: [name] is a 60 y.o. 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   On review of recent history, she had 4 hospital admissions since 09/2020. Initially admitted from home for dka and infected chronic decub ulcer right foot requiring bka. Course complicated by AKI, then upper GIB (egd erosive esophagitis), sepsis due to sacral decubitus ulcer soft tissue infection for which she required 3 more admissions.  The most recent admission was 12/12 and ongoing She presented with fever 101s and ams from the nursing home. Patient initially presumed to have SSI from chronic sacral ulcer in setting ?increased yellowish drainage. Initial CT pelvis suggest no evidence of sacral OM. She had received 7 day empiric abx. Initial wbc 5; bcx  negative. Of note, she didn't have fever here initially  She was waiting for placement, however, on 12/30 she developed new fever. There was no leukocytosis. Repeat bcx was negative. Other sepsis w/u showed cxr suggestive of pneumonia and she received 5 more days of piptazo 12/30-1/03  patietn had had 3 renal transplant. The first one was with pancrea, in 1989. Her 3rd transplant was about 10 years ago, cadaveric. She is maintained on stable cyclosporine, azathioprine, and prednisone for her renal/pancrea transplant. Her baseline serum creatinine is 1.2 to 1.3  ID is called today for wound care team concern of exposed sacral bone on her decub ulcer  She has been afebrile since 12/30. Her hemodynamics have been stable within normal range.  Palliative care had seen her on 12/20: "ms Mckercher is clear in desire for continuation of aggressive interventions."  Further history: Patient reports prior to 09/2020 has been having progressive bilateral LE weakness/atrophy. Walking initially with cane then front wheel walker Since the 09/2020 bka, has been bedbound. And she reports no improvement At  this time, can't even bear weight on her left LE    Review of Systems: ROS Other ros negative  Past Medical History:  Diagnosis Date  . Anemia of chronic renal failure   . Diabetes mellitus without complication ([place])     Social History   Tobacco Use  . Smoking status: Never Smoker  . Smokeless tobacco: Never Used  Vaping Use  . Vaping Use: Never used  Substance Use Topics  . Alcohol use: Never  . Drug use: Never    Family History  Problem Relation Age of Onset  . Hypertension Mother   . Hypertension Father    No Known Allergies  OBJECTIVE: Blood pressure (!) 144/78, pulse 80, temperature 97.8 F (36.6 C), temperature source Oral, resp. rate 20, height '5\' 3"'  (1.6 m), weight 77 kg, SpO2 97 %.  Physical Exam Constitutional:      Comments: Chronically ill appearing. No distress.  conversant  HENT:     Head: Normocephalic.     Mouth/Throat:     Mouth: Mucous membranes are moist.     Pharynx: Oropharynx is clear.  Eyes:     Pupils: Pupils are equal, round, and reactive to light.     Comments: Left conjunctiva/cornea chronic scarring  Cardiovascular:     Rate and Rhythm: Normal rate and regular rhythm.     Heart sounds: Normal heart sounds.  Pulmonary:     Effort: Pulmonary effort is normal.     Breath sounds: Normal breath sounds.  Abdominal:     Palpations: Abdomen is soft.  Musculoskeletal:     Cervical back: Neck supple.     Comments: Atrophic bilateral LE; s/p right bka   Skin:    General: Skin is warm.     Comments: Sacral midlline ulcer about 3-4 inch in diameter, clean based with granulating tissue; no purulence. There is deep tracking to sacrum  There is also 2 area of necrotic eschar along the bka site; nontender and no fluctuance or erythema. Mild fibrinous discharge  No rash otherwise  Neurological:     Mental Status: She is alert and oriented to person, place, and time. Mental status is at baseline.     Comments: Generalized weakness but much so in LE 4-5/5 bilaterally. Atrophic bilateral LE with eversion of left foot  cn2-12 intact otherwise  Psychiatric:        Mood and Affect: Mood normal.     Lab Results Lab Results  Component Value Date   WBC 5.0 12/31/2020   HGB 8.2 (L) 12/31/2020   HCT 26.2 (L) 12/31/2020   MCV 112.4 (H) 12/31/2020   PLT 311 12/31/2020    Lab Results  Component Value Date   CREATININE 1.05 (H) 12/31/2020   BUN 59 (H) 12/31/2020   NA 138 12/31/2020   K 4.7 12/31/2020   CL 101 12/31/2020   CO2 27 12/31/2020    Lab Results  Component Value Date   ALT 8 12/11/2020   AST 13 (L) 12/11/2020   ALKPHOS 69 12/11/2020   BILITOT 0.7 12/11/2020     Microbiology: Recent Results (from the past 240 hour(s))  SARS CORONAVIRUS 2 (TAT 6-24 HRS) Nasopharyngeal Nasopharyngeal Swab     Status: None   Collection  Time: 12/23/20 12:59 PM   Specimen: Nasopharyngeal Swab  Result Value Ref Range Status   SARS Coronavirus 2 NEGATIVE NEGATIVE Final    Comment: (NOTE) SARS-CoV-2 target nucleic acids are NOT DETECTED.  The SARS-CoV-2 RNA is generally detectable in upper  and lower respiratory specimens during the acute phase of infection. Negative results do not preclude SARS-CoV-2 infection, do not rule out co-infections with other pathogens, and should not be used as the sole basis for treatment or other patient management decisions. Negative results must be combined with clinical observations, patient history, and epidemiological information. The expected result is Negative.  Fact Sheet for Patients: SugarRoll.be  Fact Sheet for Healthcare Providers: https://www.woods-mathews.com/  This test is not yet approved or cleared by the [place] FDA and  has been authorized for detection and/or diagnosis of SARS-CoV-2 by FDA under an Emergency Use Authorization (EUA). This EUA will remain  in effect (meaning this test can be used) for the duration of the COVID-19 declaration under Se ction 564(b)(1) of the Act, 21 U.S.C. section 360bbb-3(b)(1), unless the authorization is terminated or revoked sooner.  Performed at [facility], [place] [address], [place], [place] [address]   Culture, blood (Routine X 2) w Reflex to ID Panel     Status: None   Collection Time: 12/25/20  1:26 PM   Specimen: BLOOD LEFT HAND  Result Value Ref Range Status   Specimen Description   Final    BLOOD LEFT HAND Performed at [facility] [address], [place], [place] [address]    Special Requests   Final    BOTTLES DRAWN AEROBIC ONLY Blood Culture adequate volume Performed at [facility] [address] [place], [place] [address]    Culture   Final    NO GROWTH 5 DAYS Performed at [facility], [place] [address]  [place], [place] [address]    Report Status 12/30/2020 FINAL  Final  Culture, blood (Routine X 2) w Reflex to ID Panel     Status: None   Collection Time: 12/25/20  1:26 PM   Specimen: BLOOD RIGHT HAND  Result Value Ref Range Status   Specimen Description   Final    BLOOD RIGHT HAND Performed at [place] [address] [place], [place] [address]    Special Requests   Final    BOTTLES DRAWN AEROBIC ONLY Blood Culture adequate volume Performed at [facility] [address] [place], [place] [address]    Culture   Final    NO GROWTH 5 DAYS Performed at [facility], [place] [address] [place], [place] [address]    Report Status 12/30/2020 FINAL  Final   Imaging: 12/12 pelv ct without contrast I personally reviewed imaging; no cortical bone erosion of the sacrum to suggest OM Sacral decubitus ulcer.  No evidence of osteomyelitis.  Apparent renal transplant within the cul-de-sac of the pelvis. Mild caliectasis noted within the renal transplant. Urinary bladder is markedly distended to the level of the umbilicus.  Aortoiliac atherosclerosis.  12/30 cxr Bilateral L>R basilar opacities  Jabier Mutton, [place] for [place] [phone] pager    12/31/2020, 2:28 PM

## 2020-12-31 NOTE — Progress Notes (Signed)
Inpatient Diabetes Program Recommendations  AACE/ADA: New Consensus Statement on Inpatient Glycemic Control (2015)  Target Ranges:  Prepandial:   less than 140 mg/dL      Peak postprandial:   less than 180 mg/dL (1-2 hours)      Critically ill patients:  140 - 180 mg/dL   Lab Results  Component Value Date   GLUCAP 115 (H) 12/31/2020   HGBA1C 8.6 (H) 12/08/2020    Admit with: Sepsis secondary to decubitus ulcer versus early pneumonia  History: Type 1 Diabetes (renal and pancreas transplant(1989)  Home DM Meds: Lantus 8 units BID                             Humalog 2 units TID  Current Orders: Lantus 4 units BID                            Novolog Sensitive Correction Scale/ SSI (0-9 units) TID AC    MD- Note patient with Hypoglycemia yesterday at 1138.  Had received 10 units Lantus insulin in addition to 3 units of correction in previous 24 hours; since Lantus has been decreased.  Would also Consider reducing correction to Novolog 0-6 units TID.   Thanks, Bronson Curb, MSN, RNC-OB Diabetes Coordinator [phone] (8a-5p)

## 2020-12-31 NOTE — TOC Progression Note (Addendum)
Transition of Care [facility]) - Progression Note    Patient Details  Name: [name] MRN: [id number] Date of Birth: Jun 03, 1961  Transition of Care [facility]) CM/SW Contact  Taraya Steward, Juliann Pulse, RN Phone Number: 12/31/2020, 12:10 PM  Clinical Narrative: CM is still trying with insurance for [employer] [facility]-awaiting response from insurance.CM & witness asked patient if agree to return back to SNF[facility](they will accept) patient declines. No HHC agency to accept.[facility] private duty rep Allison-informed that patient agree to service if d/c plan is home she will continue to follow along.MD updated.   2:57p-Spoke to [facility] rep Case manager Jennifer-they are still reviewing at 2nd level [place] following.MD aware.   Expected Discharge Plan: Long Term Acute Care (LTAC) Barriers to Discharge: Continued Medical Work up  Expected Discharge Plan and Services Expected Discharge Plan: Long Term Acute Care (LTAC)   Discharge Planning Services: CM Consult Post Acute Care Choice: [place] Living arrangements for the past 2 months: Single Family Home                               Date Steward: 12/30/20 Time [place]: [phone]     Social Determinants of Health (SDOH) Interventions    Readmission Risk Interventions No flowsheet data found.

## 2020-12-31 NOTE — Progress Notes (Signed)
Nutrition Follow-up  DOCUMENTATION CODES:   Non-severe (moderate) malnutrition in context of chronic illness  INTERVENTION:  - continue Ensure Enlive BID and Juven BID.   NUTRITION DIAGNOSIS:   Moderate Malnutrition related to chronic illness (stage 3 CKD) as evidenced by mild fat depletion,mild muscle depletion,moderate muscle depletion. -ongoing  GOAL:   Patient will meet greater than or equal to 90% of their needs -met on average   MONITOR:   PO intake,Supplement acceptance,Labs,Weight trends  ASSESSMENT:   60 year old female with medical history of chronic stage 3 pressure injury, multiple L heel wounds, type 1 DM, renal and pancreas transplant (1989), stage 3 CKD, HTN, prior R BKA d/t osteomyelitis. She was hospitalized from 11/13-11/17 due to coffee-ground emesis and was found to have severe esophagitis. She was then discharged back to SNF and again presented to the ED on 12/12 with AMS and increased drainage and odor from sacral wound with concern for infection.  Recently documented intakes: 10% of breakfast and lunch on 12/30; 100% of breakfast on 1/1; 100% of breakfast on 1/2; 50% of breakfast, 100% of lunch, and 75% of dinner on 1/3; 50% of dinner on 1/4.   She has been accepting Esure and Juven nearly 100% of the time offered.   Her weight has been fairly stable (163-170 lb) since 12/23. Mild pitting edema to LLE documented in the flow sheet.   RN notes from 1/4 indicate two instances of hypoglycemic events that were treated with juice or soda.   Last WOC note was from 1/4 afternoon.   MD note from 1/4 indicates likely d/c in 1-2 days. She is from home and is awaiting placement/Home Health, per note.     Labs reviewed; CBGs: 83 and 115 mg/dl, BUN: 59 mg/dl, creatinine: 1.05 mg/dl.  Medications reviewed; 1 mg folvite/day, sliding scale novolog, 4 units lantus BID, 50 mcg oral synthroid/day, 1 tablet multivitamin with minerals/day, 40 mg oral protonix BID, 17 g  miralax/day, 2 tablets senokot BID, 650 mg sodium bicarb BID, 100 mcg oral cyanocobalamin/day.    Diet Order:   Diet Order            Diet - low sodium heart healthy           Diet Carb Modified Fluid consistency: Thin; Room service appropriate? Yes  Diet effective now                 EDUCATION NEEDS:   No education needs have been identified at this time  Skin:  Skin Assessment: Skin Integrity Issues: Skin Integrity Issues:: Stage II,Stage IV,Other (Comment) Stage II: L heel Stage IV: sacrum (changed from unstageable to stage 4 documentation on 12/29) Unstageable: sacrum Incisions: L hand (12/16) Other: MASD to bilateral buttocks  Last BM:  1/4  Height:   Ht Readings from Last 1 Encounters:  12/30/20 '5\' 3"'  (1.6 m)    Weight:   Wt Readings from Last 1 Encounters:  12/31/20 77 kg     Estimated Nutritional Needs:  Kcal:  1825-2090 kcal Protein:  90-105 grams Fluid:  >/= 2 L/day     Jarome Matin, MS, RD, LDN, CNSC Inpatient Clinical Dietitian RD pager # available in [place]  After hours/weekend pager # available in [facility]

## 2020-12-31 NOTE — Progress Notes (Addendum)
[facility]  PROGRESS NOTE  [name] EXN:[id number] DOB: 11-14-1961 DOA: 12/07/2020 PCP: [name], MD Admit date - 12/07/2020   Admitting Physician Toy Baker, MD  Outpatient Primary MD for the patient is [name], MD  LOS - 24 Brief Narrative   [name] is a 60 y.o. 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  Hospital course patient initially started on empiric vancomycin, cefepime, Flagyl.  Due to AKI in setting of renal transplant we will switch to daptomycin instead of vancomycin and completed 7 days of therapy of Flagyl and daptomycin (12/13-12/19).  She was then started on Zosyn (12/30-1/3) for presumed aspiration pneumonia (no fever, hypotension, chest x-ray with infiltrate).  She has had prolonged hospitalization to above as well as necessary hydrotherapy for sacral decubitus ulcer.  Subjective  Today states she still has some pain at her ulcer site but otherwise has no acute complaints.  Denies any fevers or chills.  Eating well.  A & P    Unstageable chronic decubitus ulcer, present on admission.  Completed 7-day course of vancomycin/daptomycin/Flagyl (12/13-12/19).  Remains afebrile, hemodynamically stable, previous CT showed no osteomyelitis, difficult to see any exposed bone on my exam but wound care note on 12/30 reports visualized exposed bone (no mention in prior note). Risk factor likely immobilization/bed bound status -We will discuss with  ID, doubt will need antibiotics but wants to ensure -Continue wound dressing care as recommended by wound care nursing  Aspiration pneumonia, resolved.  On room air.  Completed 5-day course of IV Zosyn.  Remains afebrile and hemodynamically stable.  AKI on CKD stage III, status post renal transplant creatinine stable at baseline.  Having normal urine output -Monitor urine output, avoid nephrotoxins, monitor BMP -Continue sodium bicarb -Continue Imuran and cyclosporine  Type 1 diabetes, status post pancreas transplant, poorly controlled with prior episodes of hypoglycemia.  A1c 7.2.  Lantus decreased to 4 units twice daily has not had any recurrent hypoglycemia in the last 24 hours -Monitor CBGs, continue Lantus 4 units twice daily (home regimen 8 units twice daily) -Reduce sliding scale to 0 to 6 units 3 times daily -Appreciate diabetes coordinator assistance  Chronic macrocytic anemia, stable.  History of B12 deficiency hemoglobin stable at baseline. -Monitor CBC, continue W96 and folic acid supplementation  HTN, stable -Continue Lopressor  Hypothyroidism, stable  -continue Synthroid  Depression, stable -continue Zoloft  Erosive esophagitis, stable -continue PPI     Family Communication  : None  Code Status : Full  Disposition Plan  :  Patient is from home. Anticipated d/c date: 2 to 3 days. Barriers to d/c or necessity for inpatient status:  Consults  : None currently  Procedures  :   TTE, 12/13 1. Left ventricular ejection fraction, by estimation, is 65 to 70%. The  left ventricle has normal function. The left ventricle has no regional  wall motion abnormalities. There is moderate left ventricular hypertrophy.  Left ventricular diastolic  parameters are consistent with Grade I diastolic dysfunction (impaired  relaxation).  2. Right ventricular systolic  function is normal. The right ventricular  size is normal. Mildly increased right ventricular wall thickness.   Tricuspid regurgitation signal is inadequate for assessing PA pressure.  3. The mitral valve is normal in structure. Trivial mitral valve  regurgitation. No evidence of mitral stenosis.  4. The aortic valve was not well visualized. Aortic valve regurgitation  is trivial. No aortic stenosis is present.  5. The inferior vena cava is normal in size with greater than 50%  respiratory variability, suggesting right atrial pressure of 3 mmHg.  6. A small pericardial effusion is present.   DVT Prophylaxis  : SCDs  MDM: The below labs and imaging reports were reviewed and summarized above.  Medication management as above.  Lab Results  Component Value Date   PLT 311 12/31/2020    Diet :  Diet Order            Diet - low sodium heart healthy           Diet Carb Modified Fluid consistency: Thin; Room service appropriate? Yes  Diet effective now                  Inpatient Medications Scheduled Meds: . azaTHIOprine  50 mg Oral Daily  . Chlorhexidine Gluconate Cloth  6 each Topical Daily  . cycloSPORINE  75 mg Oral BID  . feeding supplement  237 mL Oral BID BM  . folic acid  1 mg Oral Daily  . insulin aspart  0-6 Units Subcutaneous TID WC  . insulin glargine  4 Units Subcutaneous BID  . levothyroxine  50 mcg Oral Q0600  . liver oil-zinc oxide   Topical Daily  . mouth rinse  15 mL Mouth Rinse BID  . metoprolol tartrate  12.5 mg Oral BID  . multivitamin with minerals  1 tablet Oral Daily  . nutrition supplement (JUVEN)  1 packet Oral BID BM  . nystatin   Topical TID  . pantoprazole  40 mg Oral BID  . polyethylene glycol  17 g Oral Daily  . rosuvastatin  5 mg Oral QHS  . senna-docusate  2 tablet Oral BID  . sertraline  25 mg Oral Daily  . sodium bicarbonate  650 mg Oral BID  . vitamin B-12  100 mcg Oral Daily   Continuous Infusions: . sodium chloride 250 mL (12/27/20 0514)   PRN Meds:.sodium chloride, acetaminophen **OR** acetaminophen, HYDROcodone-acetaminophen,  hydrocortisone cream, ondansetron (ZOFRAN) IV, polyvinyl alcohol  Antibiotics  :   Anti-infectives (From admission, onward)   Start     Dose/Rate Route Frequency Ordered Stop   12/25/20 1530  piperacillin-tazobactam (ZOSYN) IVPB 3.375 g        3.375 g 12.5 mL/hr over 240 Minutes Intravenous Every 8 hours 12/25/20 1421 12/30/20 1001   12/11/20 1500  ceFEPIme (MAXIPIME) 2 g in sodium chloride 0.9 % 100 mL IVPB        2 g 200 mL/hr over 30 Minutes Intravenous Every 12 hours 12/11/20 1358 12/14/20 1730   12/10/20 2000  DAPTOmycin (CUBICIN) 300 mg in sodium chloride 0.9 % IVPB        300 mg 212 mL/hr over 30 Minutes Intravenous Daily 12/10/20 0946 12/14/20 2250   12/08/20 2100  vancomycin (VANCOREADY) IVPB 500 mg/100 mL  Status:  Discontinued        500 mg 100 mL/hr over 60 Minutes Intravenous Every 24 hours 12/08/20 0040 12/10/20 0946   12/08/20 2000  ceFEPIme (MAXIPIME) 2 g in sodium chloride 0.9 % 100 mL  IVPB  Status:  Discontinued        2 g 200 mL/hr over 30 Minutes Intravenous Every 24 hours 12/08/20 0036 12/11/20 1358   12/08/20 1400  metroNIDAZOLE (FLAGYL) tablet 500 mg        500 mg Oral Every 8 hours 12/08/20 1049 12/14/20 2210   12/08/20 0600  metroNIDAZOLE (FLAGYL) IVPB 500 mg  Status:  Discontinued        500 mg 100 mL/hr over 60 Minutes Intravenous Every 8 hours 12/07/20 2248 12/08/20 1049   12/07/20 2030  ceFEPIme (MAXIPIME) 2 g in sodium chloride 0.9 % 100 mL IVPB        2 g 200 mL/hr over 30 Minutes Intravenous  Once 12/07/20 2018 12/07/20 2105   12/07/20 2030  metroNIDAZOLE (FLAGYL) IVPB 500 mg        500 mg 100 mL/hr over 60 Minutes Intravenous  Once 12/07/20 2018 12/07/20 2321   12/07/20 2030  vancomycin (VANCOCIN) IVPB 1000 mg/200 mL premix        1,000 mg 200 mL/hr over 60 Minutes Intravenous  Once 12/07/20 2018 12/07/20 2212       Objective   Vitals:   12/30/20 2051 12/31/20 0422 12/31/20 0423 12/31/20 1150  BP: (!) 148/88 103/69  (!) 144/78  Pulse: 85 73   80  Resp: 20 18  20   Temp: 98 F (36.7 C) 98.1 F (36.7 C)  97.8 F (36.6 C)  TempSrc: Oral Oral  Oral  SpO2: 96% 96%  97%  Weight:   77 kg   Height:        SpO2: 97 %  Wt Readings from Last 3 Encounters:  12/31/20 77 kg  11/12/20 58.8 kg  11/02/20 64.4 kg     Intake/Output Summary (Last 24 hours) at 12/31/2020 1302 Last data filed at 12/31/2020 0330 Gross per 24 hour  Intake 320 ml  Output 2057 ml  Net -1737 ml    Physical Exam:     Awake Alert, Oriented X 3, Normal affect No new F.N deficits,  [place].AT, Normal respiratory effort on room air, CTAB RRR,No Gallops,Rubs or new Murmurs,  +ve B.Sounds, Abd Soft, No tenderness, No rebound, guarding or rigidity. No Cyanosis, No new Rash or bruise   01/01/20      I have personally reviewed the following:   Data Reviewed:  CBC Recent Labs  Lab 12/28/20 0527 12/31/20 0830  WBC 3.6* 5.0  HGB 8.9* 8.2*  HCT 27.7* 26.2*  PLT 245 311  MCV 111.7* 112.4*  MCH 35.9* 35.2*  MCHC 32.1 31.3  RDW 24.9* 25.0*    Chemistries  Recent Labs  Lab 12/28/20 0527 12/31/20 0830  NA 138 138  K 5.0 4.7  CL 104 101  CO2 24 27  GLUCOSE 119* 140*  BUN 46* 59*  CREATININE 0.96 1.05*  CALCIUM 9.4 9.5  MG  --  2.0   ------------------------------------------------------------------------------------------------------------------ No results for input(s): CHOL, HDL, LDLCALC, TRIG, CHOLHDL, LDLDIRECT in the last 72 hours.  Lab Results  Component Value Date   HGBA1C 8.6 (H) 12/08/2020   ------------------------------------------------------------------------------------------------------------------ No results for input(s): TSH, T4TOTAL, T3FREE, THYROIDAB in the last 72 hours.  Invalid input(s): FREET3 ------------------------------------------------------------------------------------------------------------------ No results for input(s): VITAMINB12, FOLATE, FERRITIN, TIBC, IRON, RETICCTPCT in the last 72  hours.  Coagulation profile No results for input(s): INR, PROTIME in the last 168 hours.  No results for input(s): DDIMER in the last 72 hours.  Cardiac Enzymes No results for input(s): CKMB, TROPONINI, MYOGLOBIN in  the last 168 hours.  Invalid input(s): CK ------------------------------------------------------------------------------------------------------------------ No results found for: BNP  Micro Results Recent Results (from the past 240 hour(s))  SARS CORONAVIRUS 2 (TAT 6-24 HRS) Nasopharyngeal Nasopharyngeal Swab     Status: None   Collection Time: 12/23/20 12:59 PM   Specimen: Nasopharyngeal Swab  Result Value Ref Range Status   SARS Coronavirus 2 NEGATIVE NEGATIVE Final    Comment: (NOTE) SARS-CoV-2 target nucleic acids are NOT DETECTED.  The SARS-CoV-2 RNA is generally detectable in upper and lower respiratory specimens during the acute phase of infection. Negative results do not preclude SARS-CoV-2 infection, do not rule out co-infections with other pathogens, and should not be used as the sole basis for treatment or other patient management decisions. Negative results must be combined with clinical observations, patient history, and epidemiological information. The expected result is Negative.  Fact Sheet for Patients: SugarRoll.be  Fact Sheet for Healthcare Providers: https://www.woods-mathews.com/  This test is not yet approved or cleared by the [place] FDA and  has been authorized for detection and/or diagnosis of SARS-CoV-2 by FDA under an Emergency Use Authorization (EUA). This EUA will remain  in effect (meaning this test can be used) for the duration of the COVID-19 declaration under Se ction 564(b)(1) of the Act, 21 U.S.C. section 360bbb-3(b)(1), unless the authorization is terminated or revoked sooner.  Performed at [facility], [place] [address], [place], [place] [address]   Culture, blood  (Routine X 2) w Reflex to ID Panel     Status: None   Collection Time: 12/25/20  1:26 PM   Specimen: BLOOD LEFT HAND  Result Value Ref Range Status   Specimen Description   Final    BLOOD LEFT HAND Performed at [facility] [address], [place], [place] [address]    Special Requests   Final    BOTTLES DRAWN AEROBIC ONLY Blood Culture adequate volume Performed at [facility] [address] [place], [place] [address]    Culture   Final    NO GROWTH 5 DAYS Performed at [facility], [place] [address], [place], [place] [address]    Report Status 12/30/2020 FINAL  Final  Culture, blood (Routine X 2) w Reflex to ID Panel     Status: None   Collection Time: 12/25/20  1:26 PM   Specimen: BLOOD RIGHT HAND  Result Value Ref Range Status   Specimen Description   Final    BLOOD RIGHT HAND Performed at [facility] [address] [place], [place] [address]    Special Requests   Final    BOTTLES DRAWN AEROBIC ONLY Blood Culture adequate volume Performed at [facility] [address], [place], [place] [address]    Culture   Final    NO GROWTH 5 DAYS Performed at [facility], [place] [address] [place], [place] [address]    Report Status 12/30/2020 FINAL  Final    Radiology Reports CT PELVIS WO CONTRAST  Result Date: 12/07/2020 CLINICAL DATA:  Sacral ulcer, concern for osteomyelitis EXAM: CT PELVIS WITHOUT CONTRAST TECHNIQUE: Multidetector CT imaging of the pelvis was performed following the standard protocol without intravenous contrast. COMPARISON:  None. FINDINGS: Urinary Tract: Urinary bladder is markedly distended to the level of the umbilicus. There appears to be a renal transplant kidney in the cul-de-sac of the pelvis. Mild caliectasis. Bowel:  Unremarkable visualized pelvic bowel loops. Vascular/Lymphatic: Heavily calcified aorta and iliac vessels. No adenopathy.  Reproductive:  No visible  pelvic mass. Other:  No free fluid or free air. Musculoskeletal: Sacral decubital ulcer noted. No bone destruction in the sacrum or coccyx to suggest osteomyelitis. Prior right hip replacement. IMPRESSION: Sacral decubitus ulcer.  No evidence of osteomyelitis. Apparent renal transplant within the cul-de-sac of the pelvis. Mild caliectasis noted within the renal transplant. Urinary bladder is markedly distended to the level of the umbilicus. Aortoiliac atherosclerosis. Electronically Signed   By: Rolm Baptise M.D.   On: 12/07/2020 21:51   DG CHEST PORT 1 VIEW  Result Date: 12/25/2020 CLINICAL DATA:  Infected sacral decubitus ulcer. EXAM: PORTABLE CHEST 1 VIEW COMPARISON:  12/07/2020 FINDINGS: Poor inspiration with increased patchy and linear density at both lung bases, left greater than right. The remainder of the lungs are clear with normal vascularity. Normal sized heart. Left axillary surgical clips. Moderate left and mild right glenohumeral joint degenerative changes. IMPRESSION: Poor inspiration with bibasilar atelectasis and possible pneumonia, left greater than right. Electronically Signed   By: Claudie Revering M.D.   On: 12/25/2020 13:22   DG Chest Port 1 View  Result Date: 12/07/2020 CLINICAL DATA:  Questionable sepsis - evaluate for abnormality Altered mental status. EXAM: PORTABLE CHEST 1 VIEW COMPARISON:  Radiograph 09/26/2020. FINDINGS: Lung volumes are low. There are streaky opacities at the left lung base with ill-defined left hemidiaphragm. Stable heart size and mediastinal contours allowing for differences in positioning. No pulmonary edema. There is no pneumothorax or large pleural effusion. Remote right rib fractures. Bones are diffusely under mineralized. IMPRESSION: Low lung volumes with streaky opacities at the left lung base, which may represent atelectasis or pneumonia. Electronically Signed   By: Keith Rake M.D.   On: 12/07/2020 21:00   ECHOCARDIOGRAM COMPLETE  Result Date:  12/08/2020    ECHOCARDIOGRAM REPORT   Patient Name:   ARRIE BORRELLI Date of Exam: 12/08/2020 Medical Rec #:  [id number]       Height:       63.0 in Accession #:    [id number]      Weight:       115.0 lb Date of Birth:  1961-01-08      BSA:          1.528 m Patient Age:    29 years        BP:           130/71 mmHg Patient Gender: F               HR:           85 bpm. Exam Location:  Inpatient Procedure: 2D Echo, Color Doppler and Cardiac Doppler Indications:    R94.31 Abnormal EKG  History:        Patient has no prior history of Echocardiogram examinations.                 Risk Factors:Diabetes.  Sonographer:    Bernadene Person RDCS Referring Phys: [place]  1. Left ventricular ejection fraction, by estimation, is 65 to 70%. The left ventricle has normal function. The left ventricle has no regional wall motion abnormalities. There is moderate left ventricular hypertrophy. Left ventricular diastolic parameters are consistent with Grade I diastolic dysfunction (impaired relaxation).  2. Right ventricular systolic function is normal. The right ventricular size is normal. Mildly increased right ventricular wall thickness. Tricuspid regurgitation signal is inadequate for assessing PA pressure.  3. The mitral valve is normal in structure. Trivial mitral valve regurgitation. No evidence of  mitral stenosis.  4. The aortic valve was not well visualized. Aortic valve regurgitation is trivial. No aortic stenosis is present.  5. The inferior vena cava is normal in size with greater than 50% respiratory variability, suggesting right atrial pressure of 3 mmHg.  6. A small pericardial effusion is present. FINDINGS  Left Ventricle: Left ventricular ejection fraction, by estimation, is 65 to 70%. The left ventricle has normal function. The left ventricle has no regional wall motion abnormalities. The left ventricular internal cavity size was normal in size. There is  moderate left ventricular hypertrophy.  Left ventricular diastolic parameters are consistent with Grade I diastolic dysfunction (impaired relaxation). Right Ventricle: The right ventricular size is normal. Mildly increased right ventricular wall thickness. Right ventricular systolic function is normal. Tricuspid regurgitation signal is inadequate for assessing PA pressure. Left Atrium: Left atrial size was normal in size. Right Atrium: Right atrial size was normal in size. Pericardium: A small pericardial effusion is present. Mitral Valve: The mitral valve is normal in structure. Trivial mitral valve regurgitation. No evidence of mitral valve stenosis. Tricuspid Valve: The tricuspid valve is normal in structure. Tricuspid valve regurgitation is not demonstrated. Aortic Valve: The aortic valve was not well visualized. Aortic valve regurgitation is trivial. Aortic regurgitation PHT measures 520 msec. No aortic stenosis is present. Pulmonic Valve: The pulmonic valve was normal in structure. Pulmonic valve regurgitation is not visualized. Aorta: The aortic root is normal in size and structure. Venous: The inferior vena cava is normal in size with greater than 50% respiratory variability, suggesting right atrial pressure of 3 mmHg. IAS/Shunts: No atrial level shunt detected by color flow Doppler.  LEFT VENTRICLE PLAX 2D LVIDd:         2.50 cm  Diastology LVIDs:         1.20 cm  LV e' medial:    3.59 cm/s LV PW:         1.70 cm  LV E/e' medial:  15.8 LV IVS:        1.70 cm  LV e' lateral:   3.81 cm/s LVOT diam:     1.80 cm  LV E/e' lateral: 14.9 LV SV:         48 LV SV Index:   31 LVOT Area:     2.54 cm  RIGHT VENTRICLE RV S prime:     18.30 cm/s TAPSE (M-mode): 1.4 cm LEFT ATRIUM             Index      RIGHT ATRIUM          Index LA diam:        3.30 cm 2.16 cm/m RA Area:     3.33 cm LA Vol (A2C):   11.4 ml 7.46 ml/m RA Volume:   3.61 ml  2.36 ml/m LA Vol (A4C):   8.6 ml  5.62 ml/m LA Biplane Vol: 10.1 ml 6.61 ml/m  AORTIC VALVE LVOT Vmax:   96.70 cm/s  LVOT Vmean:  70.700 cm/s LVOT VTI:    0.188 m AI PHT:      520 msec  AORTA Ao Root diam: 3.30 cm Ao Asc diam:  2.60 cm MITRAL VALVE MV Area (PHT): 2.56 cm    SHUNTS MV Decel Time: 296 msec    Systemic VTI:  0.19 m MV E velocity: 56.60 cm/s  Systemic Diam: 1.80 cm MV A velocity: 72.00 cm/s MV E/A ratio:  0.79 Loralie Champagne MD Electronically signed by Loralie Champagne MD Signature Date/Time: 12/08/2020/5:15:53 PM  Final      Time Spent in minutes  30     Desiree Hane M.D on 12/31/2020 at 1:02 PM  To page go to [email or web address] - password [email or web address]

## 2021-01-01 ENCOUNTER — Inpatient Hospital Stay: Payer: Self-pay

## 2021-01-01 DIAGNOSIS — M4628 Osteomyelitis of vertebra, sacral and sacrococcygeal region: Secondary | ICD-10-CM | POA: Diagnosis not present

## 2021-01-01 DIAGNOSIS — E43 Unspecified severe protein-calorie malnutrition: Secondary | ICD-10-CM | POA: Diagnosis not present

## 2021-01-01 DIAGNOSIS — R627 Adult failure to thrive: Secondary | ICD-10-CM | POA: Diagnosis not present

## 2021-01-01 DIAGNOSIS — L89892 Pressure ulcer of other site, stage 2: Secondary | ICD-10-CM

## 2021-01-01 LAB — SEDIMENTATION RATE: Sed Rate: 136 mm/hr — ABNORMAL HIGH (ref 0–22)

## 2021-01-01 LAB — HEPATIC FUNCTION PANEL
ALT: 12 U/L (ref 0–44)
AST: 15 U/L (ref 15–41)
Albumin: 2.2 g/dL — ABNORMAL LOW (ref 3.5–5.0)
Alkaline Phosphatase: 103 U/L (ref 38–126)
Bilirubin, Direct: 0.1 mg/dL (ref 0.0–0.2)
Indirect Bilirubin: 0.4 mg/dL (ref 0.3–0.9)
Total Bilirubin: 0.5 mg/dL (ref 0.3–1.2)
Total Protein: 5.6 g/dL — ABNORMAL LOW (ref 6.5–8.1)

## 2021-01-01 LAB — GLUCOSE, CAPILLARY
Glucose-Capillary: 147 mg/dL — ABNORMAL HIGH (ref 70–99)
Glucose-Capillary: 136 mg/dL — ABNORMAL HIGH (ref 70–99)
Glucose-Capillary: 60 mg/dL — ABNORMAL LOW (ref 70–99)
Glucose-Capillary: 76 mg/dL (ref 70–99)
Glucose-Capillary: 34 mg/dL — CL (ref 70–99)
Glucose-Capillary: 42 mg/dL — CL (ref 70–99)
Glucose-Capillary: 151 mg/dL — ABNORMAL HIGH (ref 70–99)
Glucose-Capillary: 124 mg/dL — ABNORMAL HIGH (ref 70–99)

## 2021-01-01 LAB — CBC
HCT: 26.5 % — ABNORMAL LOW (ref 36.0–46.0)
Hemoglobin: 8.1 g/dL — ABNORMAL LOW (ref 12.0–15.0)
MCH: 34.6 pg — ABNORMAL HIGH (ref 26.0–34.0)
MCHC: 30.6 g/dL (ref 30.0–36.0)
MCV: 113.2 fL — ABNORMAL HIGH (ref 80.0–100.0)
Platelets: 315 10*3/uL (ref 150–400)
RBC: 2.34 MIL/uL — ABNORMAL LOW (ref 3.87–5.11)
RDW: 24.4 % — ABNORMAL HIGH (ref 11.5–15.5)
WBC: 4.6 10*3/uL (ref 4.0–10.5)
nRBC: 0 % (ref 0.0–0.2)

## 2021-01-01 LAB — BASIC METABOLIC PANEL
Anion gap: 8 (ref 5–15)
BUN: 67 mg/dL — ABNORMAL HIGH (ref 8–23)
CO2: 27 mmol/L (ref 22–32)
Calcium: 9.4 mg/dL (ref 8.9–10.3)
Chloride: 100 mmol/L (ref 98–111)
Creatinine, Ser: 0.97 mg/dL (ref 0.44–1.00)
GFR, Estimated: >60 mL/min (ref >60)
Glucose, Bld: 177 mg/dL — ABNORMAL HIGH (ref 70–99)
Potassium: 5.1 mmol/L (ref 3.5–5.1)
Sodium: 135 mmol/L (ref 135–145)

## 2021-01-01 LAB — C-REACTIVE PROTEIN: CRP: 6.9 mg/dL — ABNORMAL HIGH (ref <1.0)

## 2021-01-01 MED ORDER — SODIUM CHLORIDE 0.9 % IV SOLN
2.0000 g | INTRAVENOUS | Status: DC
  Administered 2021-01-01 – 2021-01-08 (×8): 2 g via INTRAVENOUS
  Filled 2021-01-01 (×7): qty 2
  Filled 2021-01-01: qty 20
  Filled 2021-01-01: qty 2

## 2021-01-01 MED ORDER — DOXYCYCLINE HYCLATE 100 MG PO TABS
100.0000 mg | ORAL_TABLET | Freq: Two times a day (BID) | ORAL | Status: DC
  Administered 2021-01-01 – 2021-01-08 (×15): 100 mg via ORAL
  Filled 2021-01-01 (×15): qty 1

## 2021-01-01 MED ORDER — DEXTROSE 50 % IV SOLN
25.0000 g | Freq: Once | INTRAVENOUS | Status: AC
  Administered 2021-01-01: 25 g via INTRAVENOUS
  Filled 2021-01-01: qty 50

## 2021-01-01 NOTE — Progress Notes (Signed)
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  12/12 bcx negative 12/12 catheterized urine cx with yeast  I discussed with patient at length regarding the underlying pathogenesis/natural history of sacral decubitus ulcer. She doesn't appear to have good insight of the problem however.  For unreversable cause of decub ulcer (severe debility with spinal cord injury for example), the patient will be at risk for recurrent or nonhealing ulcer and thus recurrent osteomyelitis despite appropriate treatment (which would be surgical I&D where indicated, appropriate targeted antibiotics for 6 weeks, and coverage of the wound partially into antibiotics treatment course). This would put patient at risk for MDRO and abx toxicity. In these cases, literature suggest treatment for sepsis with 1-2 week course of abx only and appropriate abscess drainage if present.  In her case, she has slightly better chance of the ulcer healing however I am still pessimistic given her progressive LE strength coming into 09/2020, and since s/p bka and has been bed-bound without any improvement at all. She is adamant about getting diagnosed and treated if OM  present  Will get mri to see if imaging suggestion of om. If there is, would get IR to biopsy for targeted therapy. Would also discuss case with plastic surgery once we confirm OM diagnosis via bone biopsy/cx, to at least plan for flap coverage if nonhealing ulcer   ----------- 01/01/2021 assessment Mri pelvis suggestive of sacral OM Right bka stump xray in setting of chronic decub ulcer/eschar showed no bony erosion Esr/crp 136/6.9   Discussed with primary team. Next step to get IR to biopsy the affect sacral area  Plan: 1. Please get IR to review MRI and biopsy sacral area and send for pathology, aerobic/anaerobic culture ------- Addendum  It appears IR unable to get sampling and defer to direct wound sampling  2. Continues off antibiotics for now  3. Please discuss case with ortho or general surgery for bone sampling 4. I continues to discuss poor prognosis with patient given her trajectory over the past year and especially the past 4 months  Active Problems:   Anemia secondary to renal failure   S/p cadaver renal transplant   Chronic kidney disease (CKD) stage G3a/A1, moderately decreased glomerular filtration rate (GFR) between 45-59 mL/min/1.73 square meter and albuminuria creatinine ratio less than 30 mg/g (HCC)   Hypertension   Acute kidney injury superimposed on CKD ([place])   Type 1 diabetes mellitus with complication, with long term current use of insulin pump ([place])  Pressure ulcer of BKA stump ([place])   Long-term use of immunosuppressant medication   Sepsis ([place])   Hypoalbuminemia   Hyponatremia   CAP (community acquired pneumonia)   Acute urinary retention   Acute metabolic encephalopathy   Sacral decubitus ulcer, stage IV (HCC)   Cellulitis   Hypotension   Malodorous urine   Erosive esophagitis   Hypothyroid   Depression   Type 1 diabetes mellitus with hyperlipidemia (HCC)   Malnutrition of moderate degree   Failure to thrive in adult   Scheduled  Meds: . azaTHIOprine  50 mg Oral Daily  . Chlorhexidine Gluconate Cloth  6 each Topical Daily  . cycloSPORINE  75 mg Oral BID  . feeding supplement  237 mL Oral BID BM  . folic acid  1 mg Oral Daily  . insulin aspart  0-6 Units Subcutaneous TID WC  . insulin glargine  4 Units Subcutaneous BID  . levothyroxine  50 mcg Oral Q0600  . liver oil-zinc oxide   Topical Daily  . mouth rinse  15 mL Mouth Rinse BID  . metoprolol tartrate  12.5 mg Oral BID  . multivitamin with minerals  1 tablet Oral Daily  . nutrition supplement (JUVEN)  1 packet Oral BID BM  . nystatin   Topical TID  . pantoprazole  40 mg Oral BID  . polyethylene glycol  17 g Oral Daily  . rosuvastatin  5 mg Oral QHS  . senna-docusate  2 tablet Oral BID  . sertraline  25 mg Oral Daily  . sodium bicarbonate  650 mg Oral BID  . vitamin B-12  100 mcg Oral Daily   Continuous Infusions: . sodium chloride 250 mL (12/27/20 0514)   PRN Meds:.sodium chloride, acetaminophen **OR** acetaminophen, HYDROcodone-acetaminophen, hydrocortisone cream, ondansetron (ZOFRAN) IV, polyvinyl alcohol   SUBJECTIVE: No complain No f/c Stable mild pain sacral area  Reviewed labs/mri pelvis and right bka stump xray with patient  Review of Systems: ROS Other ros negative   No Known Allergies  OBJECTIVE: Vitals:   12/31/20 1150 12/31/20 2035 01/01/21 0459 01/01/21 0822  BP: (!) 144/78 117/62 128/76 120/62  Pulse: 80 72 83 75  Resp: '20 17 18   ' Temp: 97.8 F (36.6 C) 98.3 F (36.8 C) 98.5 F (36.9 C)   TempSrc: Oral  Oral   SpO2: 97% 99% 96%   Weight:      Height:       Body mass index is 30.07 kg/m.  Physical Exam Constitutional:      General: She is not in acute distress.    Comments: Chronically ill appearing   HENT:     Head: Atraumatic.     Mouth/Throat:     Mouth: Mucous membranes are moist.     Pharynx: Oropharynx is clear.  Eyes:     Conjunctiva/sclera: Conjunctivae normal.     Pupils: Pupils are equal, round,  and reactive to light.  Cardiovascular:     Rate and Rhythm: Normal rate and regular rhythm.  Pulmonary:     Effort: Pulmonary effort is normal.     Breath sounds: Normal breath sounds.  Abdominal:     Palpations: Abdomen is soft.  Musculoskeletal:     Cervical back: Neck supple.     Comments: Atrophic LLE; s/p bka rle  Stable 2 eschars right bka stump    Skin:    General: Skin is warm.  Neurological:     Mental Status: She is alert.     Comments: Weak 4/5 LE  Psychiatric:        Mood and Affect: Mood normal.     Lab Results Lab Results  Component Value Date   WBC 4.6 01/01/2021   HGB 8.1 (L) 01/01/2021   HCT 26.5 (L) 01/01/2021   MCV 113.2 (H) 01/01/2021   PLT 315 01/01/2021    Lab Results  Component Value Date   CREATININE 0.97 01/01/2021   BUN 67 (H) 01/01/2021   NA 135 01/01/2021   K 5.1 01/01/2021   CL 100 01/01/2021   CO2 27 01/01/2021    Lab Results  Component Value Date   ALT 12 01/01/2021   AST 15 01/01/2021   ALKPHOS 103 01/01/2021   BILITOT 0.5 01/01/2021     Microbiology: Recent Results (from the past 240 hour(s))  SARS CORONAVIRUS 2 (TAT 6-24 HRS) Nasopharyngeal Nasopharyngeal Swab     Status: None   Collection Time: 12/23/20 12:59 PM   Specimen: Nasopharyngeal Swab  Result Value Ref Range Status   SARS Coronavirus 2 NEGATIVE NEGATIVE Final    Comment: (NOTE) SARS-CoV-2 target nucleic acids are NOT DETECTED.  The SARS-CoV-2 RNA is generally detectable in upper and lower respiratory specimens during the acute phase of infection. Negative results do not preclude SARS-CoV-2 infection, do not rule out co-infections with other pathogens, and should not be used as the sole basis for treatment or other patient management decisions. Negative results must be combined with clinical observations, patient history, and epidemiological information. The expected result is Negative.  Fact Sheet for  Patients: SugarRoll.be  Fact Sheet for Healthcare Providers: https://www.woods-mathews.com/  This test is not yet approved or cleared by the [place] FDA and  has been authorized for detection and/or diagnosis of SARS-CoV-2 by FDA under an Emergency Use Authorization (EUA). This EUA will remain  in effect (meaning this test can be used) for the duration of the COVID-19 declaration under Se ction 564(b)(1) of the Act, 21 U.S.C. section 360bbb-3(b)(1), unless the authorization is terminated or revoked sooner.  Performed at [facility], [place] [address], [place], [place] [address]   Culture, blood (Routine X 2) w Reflex to ID Panel     Status: None   Collection Time: 12/25/20  1:26 PM   Specimen: BLOOD LEFT HAND  Result Value Ref Range Status   Specimen Description   Final    BLOOD LEFT HAND Performed at [facility] [address] [place], [place] [address]    Special Requests   Final    BOTTLES DRAWN AEROBIC ONLY Blood Culture adequate volume Performed at [facility] [address], [place], [place] [address]    Culture   Final    NO GROWTH 5 DAYS Performed at [facility], [place] [address] [place], [place] [address]    Report Status 12/30/2020 FINAL  Final  Culture, blood (Routine X 2) w Reflex to ID Panel     Status: None   Collection Time: 12/25/20  1:26 PM   Specimen: BLOOD RIGHT HAND  Result Value Ref Range Status   Specimen Description   Final    BLOOD RIGHT HAND Performed at [facility] [address] [place], [place] [address]    Special Requests   Final    BOTTLES DRAWN AEROBIC ONLY Blood Culture adequate volume Performed at [facility] [address], [place], [place] [address]    Culture   Final    NO GROWTH 5 DAYS Performed at [facility], [place] [address], [place], [place] [address]    Report Status 12/30/2020  FINAL  Final    Imaging: 12/12 pelv ct without contrast Sacral decubitus ulcer. No evidence of osteomyelitis. Apparent renal transplant within the cul-de-sac of the pelvis. Mild caliectasis noted within the renal transplant. Urinary bladder is markedly distended to the level of the umbilicus. Aortoiliac atherosclerosis.  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 No bony erosion or sclerosis to suggest osteomyelitis. Soft tissue wound of the stump tip. MRI is the test of choice for the evaluation of bone marrow edema and osteomyelitis if clinically suspected.  01/05 mr pelvis 1. Sacral decubitus ulcer with acute osteomyelitis of the distal sacrum extending from S4 to the sacrococcygeal junction. 2. Bone marrow edema extends from the S3 segment through the distal coccyx, the peripheral extent of which may represent reactive osteitis versus early acute osteomyelitis. 3. Diffuse intramuscular edema throughout the visualized musculature of the pelvis compatible with a nonspecific myositis. No intramuscular fluid collection. 4. Diffuse anasarca.  Jabier Mutton, [facility] [place] [phone] pager    01/01/2021, 9:32 AM

## 2021-01-01 NOTE — Progress Notes (Signed)
OT Cancellation Note  Patient Details Name: [name] MRN: [id number] DOB: [birth year]   Cancelled Treatment:    Reason Eval/Treat Not Completed: OT screened, no needs identified, will sign off. Patient previously evaluated on 12/14. Patient total care and non ambulatory and unable to tolerate therapy with no significant change in status. Please refer to prior evaluation.   [name] 01/01/2021, 1:12 PM

## 2021-01-01 NOTE — Plan of Care (Signed)
  Problem: Nutrition: Goal: Adequate nutrition will be maintained Outcome: Progressing   Problem: Coping: Goal: Level of anxiety will decrease Outcome: Progressing   Problem: Pain Management: Goal: Pain level will decrease with appropriate interventions Outcome: Progressing

## 2021-01-01 NOTE — Progress Notes (Addendum)
ID chart update/final recs   IR, general surgery, and orthopedics surgery all defer sacral bone biopsy It is not ideal, but we'll have to assume osteomyelitis and treat with 6 weeks empiric therapy  Dispo: Patient pending approval to go to [facility]   Final abx regimen: Doxycycline 100 mg po bid; avoid dairy, polyvalent cation within 4 hours of administration; need to stay upright after consumption to avoid pill esophagitis Ceftriaxone iv 2 gram q24 hours Duration for both until 02/12/2021  Labs monitoring: Weekly cbc, cmp, crp  Other ancillary measures: Patient will need aggressive wound care, off loading, and maximize nutrition Patient will also benefit from outpatient plastic surgery for consideration of flap coverage if wound still large, at around 2-4 weeks into treatment, to prevent recurrence of osteomyelitis   ID followup with Dr Gale Journey on 1/27 @330  pm  OPAT Order Details            .Outpatient Parenteral Antibiotic Therapy Consult  Until discontinued       Comments: Ceftriaxone 2 gram iv q24hours   Please get weekly cbc, cmp, and crp  Provider:  (Not yet assigned)  Question Answer Comment  Antibiotic Ceftriaxone (Rocephin) IVPB   Indications for use osteomyelitis   End Date 02/12/2021                 [facility] address: [facility] Located in: [facility] Address: [place], [place], [place] [address] Phone: [phone]

## 2021-01-01 NOTE — Progress Notes (Signed)
Spoke with bedside nurse regarding PICC order.  Aware that PICC will not be placed today, per RN no discharge plans at this time.  Patient has adequate IV access for medications.

## 2021-01-01 NOTE — Progress Notes (Addendum)
[facility]  PROGRESS NOTE  [name] AOZ:[id number] DOB: 1961-06-20 DOA: 12/07/2020 PCP: [name], MD Admit date - 12/07/2020   Admitting Physician Toy Baker, MD  Outpatient Primary MD for the patient is [name], MD  LOS - 25 Brief Narrative   [name] is a 60 y.o. 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  Hospital course patient initially started on empiric vancomycin, cefepime, Flagyl.  Due to AKI in setting of renal transplant we will switch to daptomycin instead of vancomycin and completed 7 days of therapy of Flagyl and daptomycin (12/13-12/19).  She was then started on Zosyn (12/30-1/3) for presumed aspiration pneumonia (no fever, hypotension, chest x-ray with infiltrate).  She has had prolonged hospitalization to above as well as necessary hydrotherapy for sacral decubitus ulcer.  Subjective  Still has pain at Bexxar.  Denies any fevers, no chills, no abdominal pain, no nausea or vomiting. A & P    Unstageable chronic decubitus ulcer, present on admission, with acute osteomyelitis of sacrum.  Completed 7-day course of vancomycin/daptomycin/Flagyl (12/13-12/19).  Remains afebrile, hemodynamically stable, previous CT showed no osteomyelitis, difficult to see any exposed bone on my exam but wound care note on 12/30 reports visualized exposed bone (no mention in prior note). MRI ( 1/6//21) confirms acute osteomyelitis Risk factor likely  immobilization/bed bound status -Appreciate ID recommendations, off antibiotics given hemodynamic stability with no signs of sepsis -- Discussed with general surgery, we do not offer bone biopsies biopsies, discussed with ID who recommended doxycycline, IV ceftriaxone for 6-week course - Will need PICC, discussed with on-call nephrologist, Dr. Carolin Sicks, who states given stability in creatinine and need for antibiotic therapy okay with line placement -Continue wound dressing care as recommended by wound care nursing  Aspiration pneumonia, resolved.  On room air.  Completed 5-day course of IV Zosyn.  Remains afebrile and hemodynamically stable.  AKI on CKD stage III, status post renal transplant creatinine stable at baseline.  Having normal urine output -Monitor urine output, avoid nephrotoxins, monitor BMP -Continue sodium bicarb -Continue Imuran and cyclosporine  Type 1 diabetes, status post pancreas transplant, poorly controlled with prior episodes of hypoglycemia.  A1c 7.2.  Lantus decreased to 4 units twice daily.  Had some lower blood glucose levels while briefly being held n.p.o. for potential IR procedure on 1/6 -Monitor CBGs, continue Lantus 4 units twice daily (home regimen 8 units twice daily) -Reduced sliding scale to 0 to 6 units 3 times daily -Appreciate diabetes coordinator assistance Addendum: Noted to have blood sugar of 34 this afternoon.  With no symptoms.-Given D50 stat - Monitor CBG to ensure appropriate response - Discontinue Lantus 40 units twice daily -only monitor CBGs with sliding scale as needed 0-6 U  Chronic macrocytic anemia, stable.  History of B12 deficiency hemoglobin stable at baseline. -Monitor CBC, continue N62 and folic acid supplementation  HTN, stable -Continue Lopressor  Hypothyroidism, stable  -continue Synthroid  Depression, stable -continue Zoloft  Erosive esophagitis, stable -continue PPI     Family Communication  : None  Code  Status  : Full  Disposition Plan  :  Patient is from home. Anticipated d/c date: 2 to 3 days. Barriers to d/c or necessity for inpatient status:  Requires bone biopsy to direct likely antibiotics for acute osteomyelitis Consults  : None currently  Procedures  :   TTE, 12/13 1. Left ventricular ejection fraction, by estimation, is 65 to 70%. The  left ventricle has normal function. The left ventricle has no regional  wall motion abnormalities. There is moderate left ventricular hypertrophy.  Left ventricular diastolic  parameters are consistent with Grade I diastolic dysfunction (impaired  relaxation).  2. Right ventricular systolic function is normal. The right ventricular  size is normal. Mildly increased right ventricular wall thickness.  Tricuspid regurgitation signal is inadequate for assessing PA pressure.  3. The mitral valve is normal in structure. Trivial mitral valve  regurgitation. No evidence of mitral stenosis.  4. The aortic valve was not well visualized. Aortic valve regurgitation  is trivial. No aortic stenosis is present.  5. The inferior vena cava is normal in size with greater than 50%  respiratory variability, suggesting right atrial pressure of 3 mmHg.  6. A small pericardial effusion is present.   DVT Prophylaxis  : SCDs  MDM: The below labs and imaging reports were reviewed and summarized above.  Medication management as above.  Lab Results  Component Value Date   PLT 315 01/01/2021    Diet :  Diet Order            Diet Carb Modified Fluid consistency: Thin; Room service appropriate? Yes  Diet effective now           Diet - low sodium heart healthy                  Inpatient Medications Scheduled Meds: . azaTHIOprine  50 mg Oral Daily  . Chlorhexidine Gluconate Cloth  6 each Topical Daily  . cycloSPORINE  75 mg Oral BID  . feeding supplement  237 mL Oral BID BM  . folic acid  1 mg Oral Daily  . insulin aspart  0-6 Units Subcutaneous TID WC  .  insulin glargine  4 Units Subcutaneous BID  . levothyroxine  50 mcg Oral Q0600  . liver oil-zinc oxide   Topical Daily  . mouth rinse  15 mL Mouth Rinse BID  . metoprolol tartrate  12.5 mg Oral BID  . multivitamin with minerals  1 tablet Oral Daily  . nutrition supplement (JUVEN)  1 packet Oral BID BM  . nystatin   Topical TID  . pantoprazole  40 mg Oral BID  . polyethylene glycol  17 g Oral Daily  . rosuvastatin  5 mg Oral QHS  . senna-docusate  2 tablet Oral BID  . sertraline  25 mg Oral Daily  . sodium bicarbonate  650 mg Oral BID  . vitamin B-12  100 mcg Oral Daily   Continuous Infusions: . sodium chloride 250 mL (12/27/20 0514)   PRN Meds:.sodium chloride, acetaminophen **OR** acetaminophen, HYDROcodone-acetaminophen, hydrocortisone cream, ondansetron (ZOFRAN) IV, polyvinyl alcohol  Antibiotics  :   Anti-infectives (From admission, onward)   Start     Dose/Rate Route Frequency Ordered Stop   12/25/20 1530  piperacillin-tazobactam (ZOSYN) IVPB 3.375 g        3.375 g 12.5 mL/hr over 240 Minutes Intravenous Every 8 hours 12/25/20 1421 12/30/20 1001   12/11/20 1500  ceFEPIme (MAXIPIME) 2 g in sodium chloride 0.9 % 100 mL IVPB  2 g 200 mL/hr over 30 Minutes Intravenous Every 12 hours 12/11/20 1358 12/14/20 1730   12/10/20 2000  DAPTOmycin (CUBICIN) 300 mg in sodium chloride 0.9 % IVPB        300 mg 212 mL/hr over 30 Minutes Intravenous Daily 12/10/20 0946 12/14/20 2250   12/08/20 2100  vancomycin (VANCOREADY) IVPB 500 mg/100 mL  Status:  Discontinued        500 mg 100 mL/hr over 60 Minutes Intravenous Every 24 hours 12/08/20 0040 12/10/20 0946   12/08/20 2000  ceFEPIme (MAXIPIME) 2 g in sodium chloride 0.9 % 100 mL IVPB  Status:  Discontinued        2 g 200 mL/hr over 30 Minutes Intravenous Every 24 hours 12/08/20 0036 12/11/20 1358   12/08/20 1400  metroNIDAZOLE (FLAGYL) tablet 500 mg        500 mg Oral Every 8 hours 12/08/20 1049 12/14/20 2210   12/08/20 0600   metroNIDAZOLE (FLAGYL) IVPB 500 mg  Status:  Discontinued        500 mg 100 mL/hr over 60 Minutes Intravenous Every 8 hours 12/07/20 2248 12/08/20 1049   12/07/20 2030  ceFEPIme (MAXIPIME) 2 g in sodium chloride 0.9 % 100 mL IVPB        2 g 200 mL/hr over 30 Minutes Intravenous  Once 12/07/20 2018 12/07/20 2105   12/07/20 2030  metroNIDAZOLE (FLAGYL) IVPB 500 mg        500 mg 100 mL/hr over 60 Minutes Intravenous  Once 12/07/20 2018 12/07/20 2321   12/07/20 2030  vancomycin (VANCOCIN) IVPB 1000 mg/200 mL premix        1,000 mg 200 mL/hr over 60 Minutes Intravenous  Once 12/07/20 2018 12/07/20 2212       Objective   Vitals:   12/31/20 2035 01/01/21 0459 01/01/21 0822 01/01/21 1137  BP: 117/62 128/76 120/62 136/66  Pulse: 72 83 75 68  Resp: 17 18  16   Temp: 98.3 F (36.8 C) 98.5 F (36.9 C)  (!) 97.5 F (36.4 C)  TempSrc:  Oral  Oral  SpO2: 99% 96%  98%  Weight:      Height:        SpO2: 98 %  Wt Readings from Last 3 Encounters:  12/31/20 77 kg  11/12/20 58.8 kg  11/02/20 64.4 kg     Intake/Output Summary (Last 24 hours) at 01/01/2021 1442 Last data filed at 01/01/2021 0500 Gross per 24 hour  Intake 600 ml  Output 850 ml  Net -250 ml    Physical Exam:     Awake Alert, Oriented X 3, Normal affect No new F.N deficits,  .AT, Normal respiratory effort on room air, CTAB RRR,No Gallops,Rubs or new Murmurs,  +ve B.Sounds, Abd Soft, No tenderness, No rebound, guarding or rigidity. No Cyanosis, No new Rash or bruise    01/01/20      I have personally reviewed the following:   Data Reviewed:  CBC Recent Labs  Lab 12/28/20 0527 12/31/20 0830 01/01/21 0609  WBC 3.6* 5.0 4.6  HGB 8.9* 8.2* 8.1*  HCT 27.7* 26.2* 26.5*  PLT 245 311 315  MCV 111.7* 112.4* 113.2*  MCH 35.9* 35.2* 34.6*  MCHC 32.1 31.3 30.6  RDW 24.9* 25.0* 24.4*    Chemistries  Recent Labs  Lab 12/28/20 0527 12/31/20 0830 01/01/21 0609  NA 138 138 135  K 5.0 4.7 5.1  CL 104  101 100  CO2 24 27 27   GLUCOSE 119* 140* 177*  BUN  46* 59* 67*  CREATININE 0.96 1.05* 0.97  CALCIUM 9.4 9.5 9.4  MG  --  2.0  --   AST  --   --  15  ALT  --   --  12  ALKPHOS  --   --  103  BILITOT  --   --  0.5   ------------------------------------------------------------------------------------------------------------------ No results for input(s): CHOL, HDL, LDLCALC, TRIG, CHOLHDL, LDLDIRECT in the last 72 hours.  Lab Results  Component Value Date   HGBA1C 8.6 (H) 12/08/2020   ------------------------------------------------------------------------------------------------------------------ No results for input(s): TSH, T4TOTAL, T3FREE, THYROIDAB in the last 72 hours.  Invalid input(s): FREET3 ------------------------------------------------------------------------------------------------------------------ No results for input(s): VITAMINB12, FOLATE, FERRITIN, TIBC, IRON, RETICCTPCT in the last 72 hours.  Coagulation profile No results for input(s): INR, PROTIME in the last 168 hours.  No results for input(s): DDIMER in the last 72 hours.  Cardiac Enzymes No results for input(s): CKMB, TROPONINI, MYOGLOBIN in the last 168 hours.  Invalid input(s): CK ------------------------------------------------------------------------------------------------------------------ No results found for: BNP  Micro Results Recent Results (from the past 240 hour(s))  SARS CORONAVIRUS 2 (TAT 6-24 HRS) Nasopharyngeal Nasopharyngeal Swab     Status: None   Collection Time: 12/23/20 12:59 PM   Specimen: Nasopharyngeal Swab  Result Value Ref Range Status   SARS Coronavirus 2 NEGATIVE NEGATIVE Final    Comment: (NOTE) SARS-CoV-2 target nucleic acids are NOT DETECTED.  The SARS-CoV-2 RNA is generally detectable in upper and lower respiratory specimens during the acute phase of infection. Negative results do not preclude SARS-CoV-2 infection, do not rule out co-infections with other  pathogens, and should not be used as the sole basis for treatment or other patient management decisions. Negative results must be combined with clinical observations, patient history, and epidemiological information. The expected result is Negative.  Fact Sheet for Patients: SugarRoll.be  Fact Sheet for Healthcare Providers: https://www.woods-mathews.com/  This test is not yet approved or cleared by the [place] FDA and  has been authorized for detection and/or diagnosis of SARS-CoV-2 by FDA under an Emergency Use Authorization (EUA). This EUA will remain  in effect (meaning this test can be used) for the duration of the COVID-19 declaration under Se ction 564(b)(1) of the Act, 21 U.S.C. section 360bbb-3(b)(1), unless the authorization is terminated or revoked sooner.  Performed at [facility], [place] [address], [place], [place] [address]   Culture, blood (Routine X 2) w Reflex to ID Panel     Status: None   Collection Time: 12/25/20  1:26 PM   Specimen: BLOOD LEFT HAND  Result Value Ref Range Status   Specimen Description   Final    BLOOD LEFT HAND Performed at [facility] [address] [place], [place] [address]    Special Requests   Final    BOTTLES DRAWN AEROBIC ONLY Blood Culture adequate volume Performed at [facility] [address] [place], [place] [address]    Culture   Final    NO GROWTH 5 DAYS Performed at [facility], [place] [address] [place],  [address]    Report Status 12/30/2020 FINAL  Final  Culture, blood (Routine X 2) w Reflex to ID Panel     Status: None   Collection Time: 12/25/20  1:26 PM   Specimen: BLOOD RIGHT HAND  Result Value Ref Range Status   Specimen Description   Final    BLOOD RIGHT HAND Performed at [facility] [address] [place],  [place] [address]    Special Requests   Final    BOTTLES DRAWN AEROBIC ONLY  Blood Culture adequate volume Performed at [facility] [address], [place], [place] [address]    Culture   Final    NO GROWTH 5 DAYS Performed at [facility], [place] [address], [place], [place] [address]    Report Status 12/30/2020 FINAL  Final    Radiology Reports DG Tibia/Fibula Right  Result Date: 12/31/2020 CLINICAL DATA:  Amputation stump pain, bleeding, per elite wound EXAM: RIGHT TIBIA AND FIBULA - 2 VIEW COMPARISON:  None. FINDINGS: Status post below the knee amputation of the right tibia and fibula. No bony erosion or sclerosis. Soft tissue wound of the stump tip. Vascular calcinosis. The included knee joint is well preserved. IMPRESSION: Status post below the knee amputation of the right tibia and fibula. No bony erosion or sclerosis to suggest osteomyelitis. Soft tissue wound of the stump tip. MRI is the test of choice for the evaluation of bone marrow edema and osteomyelitis if clinically suspected. Electronically Signed   By: Eddie Candle M.D.   On: 12/31/2020 16:41   CT PELVIS WO CONTRAST  Result Date: 12/07/2020 CLINICAL DATA:  Sacral ulcer, concern for osteomyelitis EXAM: CT PELVIS WITHOUT CONTRAST TECHNIQUE: Multidetector CT imaging of the pelvis was performed following the standard protocol without intravenous contrast. COMPARISON:  None. FINDINGS: Urinary Tract: Urinary bladder is markedly distended to the level of the umbilicus. There appears to be a renal transplant kidney in the cul-de-sac of the pelvis. Mild caliectasis. Bowel:  Unremarkable visualized pelvic bowel loops. Vascular/Lymphatic: Heavily calcified aorta and iliac vessels. No adenopathy. Reproductive:  No visible pelvic mass. Other:  No free fluid or free air. Musculoskeletal: Sacral decubital ulcer noted. No bone destruction in the sacrum or coccyx to suggest osteomyelitis. Prior right hip replacement. IMPRESSION: Sacral decubitus ulcer.  No evidence of osteomyelitis. Apparent renal  transplant within the cul-de-sac of the pelvis. Mild caliectasis noted within the renal transplant. Urinary bladder is markedly distended to the level of the umbilicus. Aortoiliac atherosclerosis. Electronically Signed   By: Rolm Baptise M.D.   On: 12/07/2020 21:51   MR PELVIS W WO CONTRAST  Result Date: 01/01/2021 CLINICAL DATA:  Chronic sacral decubitus ulcer EXAM: MRI PELVIS WITHOUT AND WITH CONTRAST TECHNIQUE: Multiplanar multisequence MR imaging of the pelvis was performed both before and after administration of intravenous contrast. CONTRAST:  23mL GADAVIST GADOBUTROL 1 MMOL/ML IV SOLN COMPARISON:  CT 12/07/2020 FINDINGS: Sacral decubitus ulcer overlies the mid to distal aspect of the sacrum. Thin fluid and air collection contiguous with the ulcer base extend into the dorsal cortex of the sacrum. Bone marrow edema and confluent low T1 signal is present within the distal sacrum extending from S4 to the sacrococcygeal junction (series 8, images 20-24). Bone marrow edema extends from the S3 segment through the distal coccyx (series 4, images 19-21). Bilateral SI joints are intact. No SI joint effusion or erosive changes to suggest septic arthritis. No fracture or pelvic diastasis. There is susceptibility artifact related to right total hip arthroplasty hardware. No appreciable periprosthetic fluid collection within the limitations of the exam. Diffuse intramuscular edema throughout the visualized musculature of the pelvis compatible with a nonspecific myositis. No intramuscular fluid collection. Diffuse anasarca. Transplant kidney is seen within the low pelvis with parenchymal enhancement on postcontrast sequences. Foley catheter is present within the bladder lumen. IMPRESSION: 1. Sacral decubitus ulcer with acute osteomyelitis of the distal sacrum extending  from S4 to the sacrococcygeal junction. 2. Bone marrow edema extends from the S3 segment through the distal coccyx, the peripheral extent of which may  represent reactive osteitis versus early acute osteomyelitis. 3. Diffuse intramuscular edema throughout the visualized musculature of the pelvis compatible with a nonspecific myositis. No intramuscular fluid collection. 4. Diffuse anasarca. These results will be called to the ordering clinician or representative by the Radiologist Assistant, and communication documented in the PACS or [employer]. Electronically Signed   By: Davina Poke D.O.   On: 01/01/2021 08:23   DG CHEST PORT 1 VIEW  Result Date: 12/25/2020 CLINICAL DATA:  Infected sacral decubitus ulcer. EXAM: PORTABLE CHEST 1 VIEW COMPARISON:  12/07/2020 FINDINGS: Poor inspiration with increased patchy and linear density at both lung bases, left greater than right. The remainder of the lungs are clear with normal vascularity. Normal sized heart. Left axillary surgical clips. Moderate left and mild right glenohumeral joint degenerative changes. IMPRESSION: Poor inspiration with bibasilar atelectasis and possible pneumonia, left greater than right. Electronically Signed   By: Claudie Revering M.D.   On: 12/25/2020 13:22   DG Chest Port 1 View  Result Date: 12/07/2020 CLINICAL DATA:  Questionable sepsis - evaluate for abnormality Altered mental status. EXAM: PORTABLE CHEST 1 VIEW COMPARISON:  Radiograph 09/26/2020. FINDINGS: Lung volumes are low. There are streaky opacities at the left lung base with ill-defined left hemidiaphragm. Stable heart size and mediastinal contours allowing for differences in positioning. No pulmonary edema. There is no pneumothorax or large pleural effusion. Remote right rib fractures. Bones are diffusely under mineralized. IMPRESSION: Low lung volumes with streaky opacities at the left lung base, which may represent atelectasis or pneumonia. Electronically Signed   By: Keith Rake M.D.   On: 12/07/2020 21:00   ECHOCARDIOGRAM COMPLETE  Result Date: 12/08/2020    ECHOCARDIOGRAM REPORT   Patient Name:   INDI WILLHITE Date of Exam: 12/08/2020 Medical Rec #:  [id number]       Height:       63.0 in Accession #:    [id number]      Weight:       115.0 lb Date of Birth:  10/17/1961      BSA:          1.528 m Patient Age:    23 years        BP:           130/71 mmHg Patient Gender: F               HR:           85 bpm. Exam Location:  Inpatient Procedure: 2D Echo, Color Doppler and Cardiac Doppler Indications:    R94.31 Abnormal EKG  History:        Patient has no prior history of Echocardiogram examinations.                 Risk Factors:Diabetes.  Sonographer:    Bernadene Person RDCS Referring Phys: [place]  1. Left ventricular ejection fraction, by estimation, is 65 to 70%. The left ventricle has normal function. The left ventricle has no regional wall motion abnormalities. There is moderate left ventricular hypertrophy. Left ventricular diastolic parameters are consistent with Grade I diastolic dysfunction (impaired relaxation).  2. Right ventricular systolic function is normal. The right ventricular size is normal. Mildly increased right ventricular wall thickness. Tricuspid regurgitation signal is inadequate for assessing PA pressure.  3. The mitral valve is normal in structure.  Trivial mitral valve regurgitation. No evidence of mitral stenosis.  4. The aortic valve was not well visualized. Aortic valve regurgitation is trivial. No aortic stenosis is present.  5. The inferior vena cava is normal in size with greater than 50% respiratory variability, suggesting right atrial pressure of 3 mmHg.  6. A small pericardial effusion is present. FINDINGS  Left Ventricle: Left ventricular ejection fraction, by estimation, is 65 to 70%. The left ventricle has normal function. The left ventricle has no regional wall motion abnormalities. The left ventricular internal cavity size was normal in size. There is  moderate left ventricular hypertrophy. Left ventricular diastolic parameters are consistent with Grade  I diastolic dysfunction (impaired relaxation). Right Ventricle: The right ventricular size is normal. Mildly increased right ventricular wall thickness. Right ventricular systolic function is normal. Tricuspid regurgitation signal is inadequate for assessing PA pressure. Left Atrium: Left atrial size was normal in size. Right Atrium: Right atrial size was normal in size. Pericardium: A small pericardial effusion is present. Mitral Valve: The mitral valve is normal in structure. Trivial mitral valve regurgitation. No evidence of mitral valve stenosis. Tricuspid Valve: The tricuspid valve is normal in structure. Tricuspid valve regurgitation is not demonstrated. Aortic Valve: The aortic valve was not well visualized. Aortic valve regurgitation is trivial. Aortic regurgitation PHT measures 520 msec. No aortic stenosis is present. Pulmonic Valve: The pulmonic valve was normal in structure. Pulmonic valve regurgitation is not visualized. Aorta: The aortic root is normal in size and structure. Venous: The inferior vena cava is normal in size with greater than 50% respiratory variability, suggesting right atrial pressure of 3 mmHg. IAS/Shunts: No atrial level shunt detected by color flow Doppler.  LEFT VENTRICLE PLAX 2D LVIDd:         2.50 cm  Diastology LVIDs:         1.20 cm  LV e' medial:    3.59 cm/s LV PW:         1.70 cm  LV E/e' medial:  15.8 LV IVS:        1.70 cm  LV e' lateral:   3.81 cm/s LVOT diam:     1.80 cm  LV E/e' lateral: 14.9 LV SV:         48 LV SV Index:   31 LVOT Area:     2.54 cm  RIGHT VENTRICLE RV S prime:     18.30 cm/s TAPSE (M-mode): 1.4 cm LEFT ATRIUM             Index      RIGHT ATRIUM          Index LA diam:        3.30 cm 2.16 cm/m RA Area:     3.33 cm LA Vol (A2C):   11.4 ml 7.46 ml/m RA Volume:   3.61 ml  2.36 ml/m LA Vol (A4C):   8.6 ml  5.62 ml/m LA Biplane Vol: 10.1 ml 6.61 ml/m  AORTIC VALVE LVOT Vmax:   96.70 cm/s LVOT Vmean:  70.700 cm/s LVOT VTI:    0.188 m AI PHT:      520  msec  AORTA Ao Root diam: 3.30 cm Ao Asc diam:  2.60 cm MITRAL VALVE MV Area (PHT): 2.56 cm    SHUNTS MV Decel Time: 296 msec    Systemic VTI:  0.19 m MV E velocity: 56.60 cm/s  Systemic Diam: 1.80 cm MV A velocity: 72.00 cm/s MV E/A ratio:  0.79 Loralie Champagne MD Electronically signed by  Loralie Champagne MD Signature Date/Time: 12/08/2020/5:15:53 PM    Final      Time Spent in minutes  30     Desiree Hane M.D on 01/01/2021 at 2:42 PM  To page go to [email or web address] - password [facility]

## 2021-01-01 NOTE — Progress Notes (Signed)
PHARMACY CONSULT NOTE FOR:  OUTPATIENT  PARENTERAL ANTIBIOTIC THERAPY (OPAT)  Indication: Osteomyelitis Regimen: Ceftriaxone 2 gm IV Q 24 hours + Doxycycline 100 mg BID PO  End date: 02/12/21  IV antibiotic discharge orders are pended. To discharging provider:  please sign these orders via discharge navigator,  Select New Orders & click on the button choice - Manage This Unsigned Work.     Thank you for allowing pharmacy to be a part of this patient's care.  Jimmy Footman, PharmD, BCPS, BCIDP Infectious Diseases Clinical Pharmacist Phone: [phone] 01/01/2021, 4:29 PM

## 2021-01-01 NOTE — Progress Notes (Signed)
Physical Therapy note/Per PT Note from 12/22/2020, Recommend LTACH for continued wound care from nursing. [place] Pager [phone] Office [phone]

## 2021-01-01 NOTE — Progress Notes (Signed)
Okay to place PICC per Infectious Disease.

## 2021-01-01 NOTE — Plan of Care (Signed)
  Problem: Clinical Measurements: Goal: Will remain free from infection Outcome: Progressing   Problem: Nutrition: Goal: Adequate nutrition will be maintained Outcome: Progressing   Problem: Coping: Goal: Level of anxiety will decrease Outcome: Progressing

## 2021-01-01 NOTE — Progress Notes (Signed)
Hypoglycemic Event  CBG: 42  Treatment: D50 50 mL (25 gm)  Symptoms: None  Follow-up CBG: Time:1653 CBG Result:151  Possible Reasons for Event: Inadequate meal intake  Comments/MD notified: Notified Dr. Lisbeth Ply decision was made to hold 1 unit of insulin. Patient responded well to D50. Continue monitoring.    [name]

## 2021-01-01 NOTE — Progress Notes (Signed)
Hypoglycemic Event  CBG: 34  Treatment: 8 oz juice/soda  Symptoms: None  Follow-up CBG: Time:1617 CBG Result: 42  Possible Reasons for Event: Inadequate meal intake  Comments/MD notified:Dr S. Mechele Dawley  Gurkaran Rahm

## 2021-01-01 NOTE — Progress Notes (Signed)
While in the room performing a dressing and linen change, the patient stated to this nurse, "I don't recognize the room".  The patient was able to answer questions: what day is it, where are you, why are you here.  Patient face was symmetrical, grips normal per her baseline, and movement of upper extremities equal.  CBG was 147. When asked what felt different, the patient was unable to specify.  After 15 minutes patient still felt that something was different, and decided that maybe the room had been cleaned.    Will continue to monitor.    Candie Mile, RN

## 2021-01-01 NOTE — Progress Notes (Signed)
PT Cancellation Note  Patient Details Name: Zandra Lajeunesse MRN: [id number] DOB: 1961/06/04   Cancelled Treatment:    Reason Eval/Treat Not Completed: PT screened, no needs identified, will sign off, PT has been evaluated  Previously for PT and deemed not indicated as patient requires total assistance for ADL's and is non ambulatory. Hydrotherapy also discontinued. Wound care/dressing changes  to be performed by nursing.   Claretha Cooper 01/01/2021, 11:16 AM [place] Pager [phone] Office [phone]

## 2021-01-02 LAB — GLUCOSE, CAPILLARY
Glucose-Capillary: 239 mg/dL — ABNORMAL HIGH (ref 70–99)
Glucose-Capillary: 236 mg/dL — ABNORMAL HIGH (ref 70–99)
Glucose-Capillary: 296 mg/dL — ABNORMAL HIGH (ref 70–99)
Glucose-Capillary: 172 mg/dL — ABNORMAL HIGH (ref 70–99)

## 2021-01-02 MED ORDER — INSULIN ASPART 100 UNIT/ML ~~LOC~~ SOLN
0.0000 [IU] | SUBCUTANEOUS | Status: DC
  Administered 2021-01-02: 3 [IU] via SUBCUTANEOUS
  Administered 2021-01-02: 1 [IU] via SUBCUTANEOUS
  Administered 2021-01-02: 2 [IU] via SUBCUTANEOUS
  Administered 2021-01-03 – 2021-01-04 (×5): 1 [IU] via SUBCUTANEOUS
  Administered 2021-01-04: 2 [IU] via SUBCUTANEOUS
  Administered 2021-01-04 (×2): 1 [IU] via SUBCUTANEOUS
  Administered 2021-01-05 (×2): 3 [IU] via SUBCUTANEOUS
  Administered 2021-01-05 – 2021-01-08 (×8): 1 [IU] via SUBCUTANEOUS

## 2021-01-02 MED ORDER — SODIUM CHLORIDE 0.9% FLUSH: 10.0000 mL | INTRAVENOUS | Status: DC | PRN

## 2021-01-02 MED ORDER — INSULIN GLARGINE 100 UNIT/ML ~~LOC~~ SOLN
4.0000 [IU] | Freq: Every day | SUBCUTANEOUS | Status: DC
  Administered 2021-01-02 – 2021-01-08 (×7): 4 [IU] via SUBCUTANEOUS
  Filled 2021-01-02 (×7): qty 0.04

## 2021-01-02 NOTE — Plan of Care (Signed)
  Problem: Health Behavior/Discharge Planning: Goal: Ability to manage health-related needs will improve 01/02/2021 1407 by Lennie Hummer, RN Outcome: Progressing 01/02/2021 1405 by Lennie Hummer, RN Outcome: Progressing   Problem: Clinical Measurements: Goal: Will remain free from infection 01/02/2021 1407 by Lennie Hummer, RN Outcome: Progressing 01/02/2021 1405 by Lennie Hummer, RN Outcome: Progressing   Problem: Nutrition: Goal: Adequate nutrition will be maintained Outcome: Progressing   Problem: Safety: Goal: Ability to remain free from injury will improve Outcome: Progressing   Problem: Skin Integrity: Goal: Risk for impaired skin integrity will decrease Outcome: Progressing   Problem: Activity: Goal: Ability to perform//tolerate increased activity and mobilize with assistive devices will improve Outcome: Progressing   Problem: Self-Care: Goal: Ability to meet self-care needs will improve 01/02/2021 1407 by Lennie Hummer, RN Outcome: Progressing 01/02/2021 1405 by Lennie Hummer, RN Outcome: Progressing

## 2021-01-02 NOTE — Progress Notes (Signed)
[facility]  PROGRESS NOTE  [name] FFM:[id number] DOB: [birth year] DOA: 12/07/2020 PCP: [name], MD Admit date - 12/07/2020   Admitting Physician Toy Baker, MD  Outpatient Primary MD for the patient is [name], MD  LOS - 26 Brief Narrative   [name] is a 60 y.o. 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  Hospital course patient initially started on empiric vancomycin, cefepime, Flagyl.  Due to AKI in setting of renal transplant we will switch to daptomycin instead of vancomycin and completed 7 days of therapy of Flagyl and daptomycin (12/13-12/19).  She was then started on Zosyn (12/30-1/3) for presumed aspiration pneumonia (no fever, hypotension, chest x-ray with infiltrate).  She has had prolonged hospitalization to above as well as necessary hydrotherapy for sacral decubitus ulcer.  Subjective  Still occasionally complaining of pain at backside.  Was started on IV ceftriaxone and doxycycline yesterday per recommendations by ID.  Currently awaiting PICC line placement A & P    Unstageable chronic decubitus ulcer, present on admission, with acute osteomyelitis of sacrum.  Completed 7-day course of vancomycin/daptomycin/Flagyl (12/13-12/19).  Remains afebrile, hemodynamically stable, previous CT showed no osteomyelitis, difficult to see any exposed bone on my exam but wound care note on 12/30 reports visualized exposed bone (no mention in prior note).  MRI ( 1/6//21) confirms acute osteomyelitis Risk factor likely immobilization/bed bound status.  Unable to get bone biopsy as not offered by general surgery -Appreciate ID recommendations, IV ceftriaxone 2 g every 24 and doxycycline 100 mg p.o. twice daily, End date 02/12/2021 - Will need PICC, discussed with on-call nephrologist, Dr. Carolin Sicks and 1/6, who states given stability in creatinine and need for antibiotic therapy okay with line placement -Continue wound dressing care as recommended by wound care nursing  Aspiration pneumonia, resolved.  On room air.  Completed 5-day course of IV Zosyn.  Remains afebrile and hemodynamically stable.  AKI on CKD stage III, status post renal transplant creatinine stable at baseline.  Having normal urine output -Monitor urine output, avoid nephrotoxins, monitor BMP -Continue sodium bicarb -Continue Imuran and cyclosporine  Type 1 diabetes, status post pancreas transplant, poorly controlled recurrent hypoglycemia.  A1c 7.2.  Last hypoglycemic episode on 1/6 with glucose of 34 back in the setting of n.p.o. status for possible procedure at that time.  Was monitored off of long-acting insulin and now CBGs in the [place].  Patient is eating well. - We will resume Lantus at reduced dose of 4 units daily,  -Reduced sliding scale to 0 to 6 units every 4 hours allow for better nighttime coverage -Appreciate diabetes coordinator assistance  Chronic macrocytic anemia, stable.  History of B12 deficiency hemoglobin stable at baseline. -Monitor CBC, continue T70 and folic acid supplementation  HTN, stable -Continue Lopressor  Hypothyroidism, stable  -continue Synthroid  Depression, stable -continue Zoloft  Erosive esophagitis, stable -continue PPI     Family Communication  : Husband updated at bedside on 1/7  Code Status : Full  Disposition Plan  :  Patient is from SNF. Anticipated  d/c date:  1-2 days. Barriers to d/c or necessity for inpatient status:  IV  antibiotics, requiring PICC placement. New SNF bed offer, TOC will assist over weekend Consults  : Infectious disease, nephrology  Procedures  :   TTE, 12/13 1. Left ventricular ejection fraction, by estimation, is 65 to 70%. The  left ventricle has normal function. The left ventricle has no regional  wall motion abnormalities. There is moderate left ventricular hypertrophy.  Left ventricular diastolic  parameters are consistent with Grade I diastolic dysfunction (impaired  relaxation).  2. Right ventricular systolic function is normal. The right ventricular  size is normal. Mildly increased right ventricular wall thickness.  Tricuspid regurgitation signal is inadequate for assessing PA pressure.  3. The mitral valve is normal in structure. Trivial mitral valve  regurgitation. No evidence of mitral stenosis.  4. The aortic valve was not well visualized. Aortic valve regurgitation  is trivial. No aortic stenosis is present.  5. The inferior vena cava is normal in size with greater than 50%  respiratory variability, suggesting right atrial pressure of 3 mmHg.  6. A small pericardial effusion is present.   DVT Prophylaxis  : SCDs  MDM: The below labs and imaging reports were reviewed and summarized above.  Medication management as above.  Lab Results  Component Value Date   PLT 315 01/01/2021    Diet :  Diet Order            Diet Carb Modified Fluid consistency: Thin; Room service appropriate? Yes  Diet effective now           Diet - low sodium heart healthy                  Inpatient Medications Scheduled Meds: . azaTHIOprine  50 mg Oral Daily  . Chlorhexidine Gluconate Cloth  6 each Topical Daily  . cycloSPORINE  75 mg Oral BID  . doxycycline  100 mg Oral Q12H  . feeding supplement  237 mL Oral BID BM  . folic acid  1 mg Oral Daily  . insulin aspart  0-6 Units Subcutaneous Q4H  . insulin glargine  4 Units Subcutaneous Daily  . levothyroxine  50 mcg Oral  Q0600  . liver oil-zinc oxide   Topical Daily  . mouth rinse  15 mL Mouth Rinse BID  . metoprolol tartrate  12.5 mg Oral BID  . multivitamin with minerals  1 tablet Oral Daily  . nutrition supplement (JUVEN)  1 packet Oral BID BM  . nystatin   Topical TID  . pantoprazole  40 mg Oral BID  . polyethylene glycol  17 g Oral Daily  . rosuvastatin  5 mg Oral QHS  . senna-docusate  2 tablet Oral BID  . sertraline  25 mg Oral Daily  . sodium bicarbonate  650 mg Oral BID  . vitamin B-12  100 mcg Oral Daily   Continuous Infusions: . sodium chloride 250 mL (12/27/20 0514)  . cefTRIAXone (ROCEPHIN)  IV 2 g (01/01/21 1817)   PRN Meds:.sodium chloride, acetaminophen **OR** acetaminophen, HYDROcodone-acetaminophen, hydrocortisone cream, ondansetron (ZOFRAN) IV, polyvinyl alcohol  Antibiotics  :   Anti-infectives (From admission, onward)   Start     Dose/Rate Route Frequency Ordered Stop   01/01/21 2200  doxycycline (VIBRA-TABS) tablet 100 mg        100 mg Oral Every 12 hours 01/01/21 1626     01/01/21 1715  cefTRIAXone (ROCEPHIN) 2 g in sodium chloride 0.9 % 100 mL IVPB  2 g 200 mL/hr over 30 Minutes Intravenous Every 24 hours 01/01/21 1626     12/25/20 1530  piperacillin-tazobactam (ZOSYN) IVPB 3.375 g        3.375 g 12.5 mL/hr over 240 Minutes Intravenous Every 8 hours 12/25/20 1421 12/30/20 1001   12/11/20 1500  ceFEPIme (MAXIPIME) 2 g in sodium chloride 0.9 % 100 mL IVPB        2 g 200 mL/hr over 30 Minutes Intravenous Every 12 hours 12/11/20 1358 12/14/20 1730   12/10/20 2000  DAPTOmycin (CUBICIN) 300 mg in sodium chloride 0.9 % IVPB        300 mg 212 mL/hr over 30 Minutes Intravenous Daily 12/10/20 0946 12/14/20 2250   12/08/20 2100  vancomycin (VANCOREADY) IVPB 500 mg/100 mL  Status:  Discontinued        500 mg 100 mL/hr over 60 Minutes Intravenous Every 24 hours 12/08/20 0040 12/10/20 0946   12/08/20 2000  ceFEPIme (MAXIPIME) 2 g in sodium chloride 0.9 % 100 mL IVPB  Status:   Discontinued        2 g 200 mL/hr over 30 Minutes Intravenous Every 24 hours 12/08/20 0036 12/11/20 1358   12/08/20 1400  metroNIDAZOLE (FLAGYL) tablet 500 mg        500 mg Oral Every 8 hours 12/08/20 1049 12/14/20 2210   12/08/20 0600  metroNIDAZOLE (FLAGYL) IVPB 500 mg  Status:  Discontinued        500 mg 100 mL/hr over 60 Minutes Intravenous Every 8 hours 12/07/20 2248 12/08/20 1049   12/07/20 2030  ceFEPIme (MAXIPIME) 2 g in sodium chloride 0.9 % 100 mL IVPB        2 g 200 mL/hr over 30 Minutes Intravenous  Once 12/07/20 2018 12/07/20 2105   12/07/20 2030  metroNIDAZOLE (FLAGYL) IVPB 500 mg        500 mg 100 mL/hr over 60 Minutes Intravenous  Once 12/07/20 2018 12/07/20 2321   12/07/20 2030  vancomycin (VANCOCIN) IVPB 1000 mg/200 mL premix        1,000 mg 200 mL/hr over 60 Minutes Intravenous  Once 12/07/20 2018 12/07/20 2212       Objective   Vitals:   01/01/21 1137 01/01/21 2115 01/02/21 0516 01/02/21 0600  BP: 136/66 116/68 127/72   Pulse: 68 74 83   Resp: 16 18 18    Temp: (!) 97.5 F (36.4 C) 98.6 F (37 C) 98.5 F (36.9 C)   TempSrc: Oral Oral Oral   SpO2: 98% 95% 95%   Weight:    72 kg  Height:        SpO2: 95 %  Wt Readings from Last 3 Encounters:  01/02/21 72 kg  11/12/20 58.8 kg  11/02/20 64.4 kg     Intake/Output Summary (Last 24 hours) at 01/02/2021 1151 Last data filed at 01/02/2021 1000 Gross per 24 hour  Intake 580 ml  Output 1850 ml  Net -1270 ml    Physical Exam:     Awake Alert, Oriented X 3, Normal affect No new F.N deficits,  [place].AT, Normal respiratory effort on room air, CTAB RRR,No Gallops,Rubs or new Murmurs,  +ve B.Sounds, Abd Soft, No tenderness, No rebound, guarding or rigidity. No Cyanosis, No new Rash or bruise         I have personally reviewed the following:   Data Reviewed:  CBC Recent Labs  Lab 12/28/20 0527 12/31/20 0830 01/01/21 0609  WBC 3.6* 5.0 4.6  HGB 8.9* 8.2* 8.1*  HCT  27.7* 26.2* 26.5*  PLT  245 311 315  MCV 111.7* 112.4* 113.2*  MCH 35.9* 35.2* 34.6*  MCHC 32.1 31.3 30.6  RDW 24.9* 25.0* 24.4*    Chemistries  Recent Labs  Lab 12/28/20 0527 12/31/20 0830 01/01/21 0609  NA 138 138 135  K 5.0 4.7 5.1  CL 104 101 100  CO2 24 27 27   GLUCOSE 119* 140* 177*  BUN 46* 59* 67*  CREATININE 0.96 1.05* 0.97  CALCIUM 9.4 9.5 9.4  MG  --  2.0  --   AST  --   --  15  ALT  --   --  12  ALKPHOS  --   --  103  BILITOT  --   --  0.5   ------------------------------------------------------------------------------------------------------------------ No results for input(s): CHOL, HDL, LDLCALC, TRIG, CHOLHDL, LDLDIRECT in the last 72 hours.  Lab Results  Component Value Date   HGBA1C 8.6 (H) 12/08/2020   ------------------------------------------------------------------------------------------------------------------ No results for input(s): TSH, T4TOTAL, T3FREE, THYROIDAB in the last 72 hours.  Invalid input(s): FREET3 ------------------------------------------------------------------------------------------------------------------ No results for input(s): VITAMINB12, FOLATE, FERRITIN, TIBC, IRON, RETICCTPCT in the last 72 hours.  Coagulation profile No results for input(s): INR, PROTIME in the last 168 hours.  No results for input(s): DDIMER in the last 72 hours.  Cardiac Enzymes No results for input(s): CKMB, TROPONINI, MYOGLOBIN in the last 168 hours.  Invalid input(s): CK ------------------------------------------------------------------------------------------------------------------ No results found for: BNP  Micro Results Recent Results (from the past 240 hour(s))  SARS CORONAVIRUS 2 (TAT 6-24 HRS) Nasopharyngeal Nasopharyngeal Swab     Status: None   Collection Time: 12/23/20 12:59 PM   Specimen: Nasopharyngeal Swab  Result Value Ref Range Status   SARS Coronavirus 2 NEGATIVE NEGATIVE Final    Comment: (NOTE) SARS-CoV-2 target nucleic acids are NOT  DETECTED.  The SARS-CoV-2 RNA is generally detectable in upper and lower respiratory specimens during the acute phase of infection. Negative results do not preclude SARS-CoV-2 infection, do not rule out co-infections with other pathogens, and should not be used as the sole basis for treatment or other patient management decisions. Negative results must be combined with clinical observations, patient history, and epidemiological information. The expected result is Negative.  Fact Sheet for Patients: SugarRoll.be  Fact Sheet for Healthcare Providers: https://www.woods-mathews.com/  This test is not yet approved or cleared by the [place] FDA and  has been authorized for detection and/or diagnosis of SARS-CoV-2 by FDA under an Emergency Use Authorization (EUA). This EUA will remain  in effect (meaning this test can be used) for the duration of the COVID-19 declaration under Se ction 564(b)(1) of the Act, 21 U.S.C. section 360bbb-3(b)(1), unless the authorization is terminated or revoked sooner.  Performed at [facility], [place] [address], [place], [place] [address]   Culture, blood (Routine X 2) w Reflex to ID Panel     Status: None   Collection Time: 12/25/20  1:26 PM   Specimen: BLOOD LEFT HAND  Result Value Ref Range Status   Specimen Description   Final    BLOOD LEFT HAND Performed at [facility] [address], [place], [place] [address]    Special Requests   Final    BOTTLES DRAWN AEROBIC ONLY Blood Culture adequate volume Performed at [facility] [address] [place],  [address]    Culture   Final    NO GROWTH 5 DAYS Performed at [facility], [place] [address], [place], [place]  [address]    Report Status 12/30/2020 FINAL  Final  Culture, blood (Routine X 2) w Reflex to ID Panel     Status: None   Collection Time: 12/25/20  1:26 PM   Specimen: BLOOD RIGHT HAND   Result Value Ref Range Status   Specimen Description   Final    BLOOD RIGHT HAND Performed at [facility] [address] [place], [place] [address]    Special Requests   Final    BOTTLES DRAWN AEROBIC ONLY Blood Culture adequate volume Performed at [facility] [address], [place], [place] [address]    Culture   Final    NO GROWTH 5 DAYS Performed at [facility], [place] [address], [place], [place] [address]    Report Status 12/30/2020 FINAL  Final    Radiology Reports DG Tibia/Fibula Right  Result Date: 12/31/2020 CLINICAL DATA:  Amputation stump pain, bleeding, per elite wound EXAM: RIGHT TIBIA AND FIBULA - 2 VIEW COMPARISON:  None. FINDINGS: Status post below the knee amputation of the right tibia and fibula. No bony erosion or sclerosis. Soft tissue wound of the stump tip. Vascular calcinosis. The included knee joint is well preserved. IMPRESSION: Status post below the knee amputation of the right tibia and fibula. No bony erosion or sclerosis to suggest osteomyelitis. Soft tissue wound of the stump tip. MRI is the test of choice for the evaluation of bone marrow edema and osteomyelitis if clinically suspected. Electronically Signed   By: Eddie Candle M.D.   On: 12/31/2020 16:41   CT PELVIS WO CONTRAST  Result Date: 12/07/2020 CLINICAL DATA:  Sacral ulcer, concern for osteomyelitis EXAM: CT PELVIS WITHOUT CONTRAST TECHNIQUE: Multidetector CT imaging of the pelvis was performed following the standard protocol without intravenous contrast. COMPARISON:  None. FINDINGS: Urinary Tract: Urinary bladder is markedly distended to the level of the umbilicus. There appears to be a renal transplant kidney in the cul-de-sac of the pelvis. Mild caliectasis. Bowel:  Unremarkable visualized pelvic bowel loops. Vascular/Lymphatic: Heavily calcified aorta and iliac vessels. No adenopathy. Reproductive:  No visible pelvic mass. Other:  No free fluid or free  air. Musculoskeletal: Sacral decubital ulcer noted. No bone destruction in the sacrum or coccyx to suggest osteomyelitis. Prior right hip replacement. IMPRESSION: Sacral decubitus ulcer.  No evidence of osteomyelitis. Apparent renal transplant within the cul-de-sac of the pelvis. Mild caliectasis noted within the renal transplant. Urinary bladder is markedly distended to the level of the umbilicus. Aortoiliac atherosclerosis. Electronically Signed   By: Rolm Baptise M.D.   On: 12/07/2020 21:51   MR PELVIS W WO CONTRAST  Result Date: 01/01/2021 CLINICAL DATA:  Chronic sacral decubitus ulcer EXAM: MRI PELVIS WITHOUT AND WITH CONTRAST TECHNIQUE: Multiplanar multisequence MR imaging of the pelvis was performed both before and after administration of intravenous contrast. CONTRAST:  79mL GADAVIST GADOBUTROL 1 MMOL/ML IV SOLN COMPARISON:  CT 12/07/2020 FINDINGS: Sacral decubitus ulcer overlies the mid to distal aspect of the sacrum. Thin fluid and air collection contiguous with the ulcer base extend into the dorsal cortex of the sacrum. Bone marrow edema and confluent low T1 signal is present within the distal sacrum extending from S4 to the sacrococcygeal junction (series 8, images 20-24). Bone marrow edema extends from the S3 segment through the distal coccyx (series 4, images 19-21). Bilateral SI joints are intact. No SI joint effusion or erosive changes to suggest septic arthritis. No fracture or pelvic diastasis. There is susceptibility artifact related to right  total hip arthroplasty hardware. No appreciable periprosthetic fluid collection within the limitations of the exam. Diffuse intramuscular edema throughout the visualized musculature of the pelvis compatible with a nonspecific myositis. No intramuscular fluid collection. Diffuse anasarca. Transplant kidney is seen within the low pelvis with parenchymal enhancement on postcontrast sequences. Foley catheter is present within the bladder lumen. IMPRESSION: 1.  Sacral decubitus ulcer with acute osteomyelitis of the distal sacrum extending from S4 to the sacrococcygeal junction. 2. Bone marrow edema extends from the S3 segment through the distal coccyx, the peripheral extent of which may represent reactive osteitis versus early acute osteomyelitis. 3. Diffuse intramuscular edema throughout the visualized musculature of the pelvis compatible with a nonspecific myositis. No intramuscular fluid collection. 4. Diffuse anasarca. These results will be called to the ordering clinician or representative by the Radiologist Assistant, and communication documented in the PACS or [employer]. Electronically Signed   By: Davina Poke D.O.   On: 01/01/2021 08:23   DG CHEST PORT 1 VIEW  Result Date: 12/25/2020 CLINICAL DATA:  Infected sacral decubitus ulcer. EXAM: PORTABLE CHEST 1 VIEW COMPARISON:  12/07/2020 FINDINGS: Poor inspiration with increased patchy and linear density at both lung bases, left greater than right. The remainder of the lungs are clear with normal vascularity. Normal sized heart. Left axillary surgical clips. Moderate left and mild right glenohumeral joint degenerative changes. IMPRESSION: Poor inspiration with bibasilar atelectasis and possible pneumonia, left greater than right. Electronically Signed   By: Claudie Revering M.D.   On: 12/25/2020 13:22   DG Chest Port 1 View  Result Date: 12/07/2020 CLINICAL DATA:  Questionable sepsis - evaluate for abnormality Altered mental status. EXAM: PORTABLE CHEST 1 VIEW COMPARISON:  Radiograph 09/26/2020. FINDINGS: Lung volumes are low. There are streaky opacities at the left lung base with ill-defined left hemidiaphragm. Stable heart size and mediastinal contours allowing for differences in positioning. No pulmonary edema. There is no pneumothorax or large pleural effusion. Remote right rib fractures. Bones are diffusely under mineralized. IMPRESSION: Low lung volumes with streaky opacities at the left lung  base, which may represent atelectasis or pneumonia. Electronically Signed   By: Keith Rake M.D.   On: 12/07/2020 21:00   ECHOCARDIOGRAM COMPLETE  Result Date: 12/08/2020    ECHOCARDIOGRAM REPORT   Patient Name:   HENNIE GOSA Date of Exam: 12/08/2020 Medical Rec #:  [id number]       Height:       63.0 in Accession #:    [id number]      Weight:       115.0 lb Date of Birth:  1961/05/12      BSA:          1.528 m Patient Age:    1 years        BP:           130/71 mmHg Patient Gender: F               HR:           85 bpm. Exam Location:  Inpatient Procedure: 2D Echo, Color Doppler and Cardiac Doppler Indications:    R94.31 Abnormal EKG  History:        Patient has no prior history of Echocardiogram examinations.                 Risk Factors:Diabetes.  Sonographer:    Bernadene Person RDCS Referring Phys: [place]  1. Left ventricular ejection fraction, by estimation, is 65 to 70%. The  left ventricle has normal function. The left ventricle has no regional wall motion abnormalities. There is moderate left ventricular hypertrophy. Left ventricular diastolic parameters are consistent with Grade I diastolic dysfunction (impaired relaxation).  2. Right ventricular systolic function is normal. The right ventricular size is normal. Mildly increased right ventricular wall thickness. Tricuspid regurgitation signal is inadequate for assessing PA pressure.  3. The mitral valve is normal in structure. Trivial mitral valve regurgitation. No evidence of mitral stenosis.  4. The aortic valve was not well visualized. Aortic valve regurgitation is trivial. No aortic stenosis is present.  5. The inferior vena cava is normal in size with greater than 50% respiratory variability, suggesting right atrial pressure of 3 mmHg.  6. A small pericardial effusion is present. FINDINGS  Left Ventricle: Left ventricular ejection fraction, by estimation, is 65 to 70%. The left ventricle has normal function. The  left ventricle has no regional wall motion abnormalities. The left ventricular internal cavity size was normal in size. There is  moderate left ventricular hypertrophy. Left ventricular diastolic parameters are consistent with Grade I diastolic dysfunction (impaired relaxation). Right Ventricle: The right ventricular size is normal. Mildly increased right ventricular wall thickness. Right ventricular systolic function is normal. Tricuspid regurgitation signal is inadequate for assessing PA pressure. Left Atrium: Left atrial size was normal in size. Right Atrium: Right atrial size was normal in size. Pericardium: A small pericardial effusion is present. Mitral Valve: The mitral valve is normal in structure. Trivial mitral valve regurgitation. No evidence of mitral valve stenosis. Tricuspid Valve: The tricuspid valve is normal in structure. Tricuspid valve regurgitation is not demonstrated. Aortic Valve: The aortic valve was not well visualized. Aortic valve regurgitation is trivial. Aortic regurgitation PHT measures 520 msec. No aortic stenosis is present. Pulmonic Valve: The pulmonic valve was normal in structure. Pulmonic valve regurgitation is not visualized. Aorta: The aortic root is normal in size and structure. Venous: The inferior vena cava is normal in size with greater than 50% respiratory variability, suggesting right atrial pressure of 3 mmHg. IAS/Shunts: No atrial level shunt detected by color flow Doppler.  LEFT VENTRICLE PLAX 2D LVIDd:         2.50 cm  Diastology LVIDs:         1.20 cm  LV e' medial:    3.59 cm/s LV PW:         1.70 cm  LV E/e' medial:  15.8 LV IVS:        1.70 cm  LV e' lateral:   3.81 cm/s LVOT diam:     1.80 cm  LV E/e' lateral: 14.9 LV SV:         48 LV SV Index:   31 LVOT Area:     2.54 cm  RIGHT VENTRICLE RV S prime:     18.30 cm/s TAPSE (M-mode): 1.4 cm LEFT ATRIUM             Index      RIGHT ATRIUM          Index LA diam:        3.30 cm 2.16 cm/m RA Area:     3.33 cm LA Vol  (A2C):   11.4 ml 7.46 ml/m RA Volume:   3.61 ml  2.36 ml/m LA Vol (A4C):   8.6 ml  5.62 ml/m LA Biplane Vol: 10.1 ml 6.61 ml/m  AORTIC VALVE LVOT Vmax:   96.70 cm/s LVOT Vmean:  70.700 cm/s LVOT VTI:    0.188 m AI  PHT:      520 msec  AORTA Ao Root diam: 3.30 cm Ao Asc diam:  2.60 cm MITRAL VALVE MV Area (PHT): 2.56 cm    SHUNTS MV Decel Time: 296 msec    Systemic VTI:  0.19 m MV E velocity: 56.60 cm/s  Systemic Diam: 1.80 cm MV A velocity: 72.00 cm/s MV E/A ratio:  0.79 Loralie Champagne MD Electronically signed by Loralie Champagne MD Signature Date/Time: 12/08/2020/5:15:53 PM    Final    [place] EKG SITE RITE  Result Date: 01/01/2021 If Site Rite image not attached, placement could not be confirmed due to current cardiac rhythm.    Time Spent in minutes  30     Desiree Hane M.D on 01/02/2021 at 11:51 AM  To page go to [email or web address] - password [facility]

## 2021-01-02 NOTE — TOC Progression Note (Addendum)
Transition of Care [facility]) - Progression Note    Patient Details  Name: [name] MRN: [id number] Date of Birth: [birth year]  Transition of Care [facility]) CM/SW Contact  Nazier Neyhart, Juliann Pulse, RN Phone Number: 01/02/2021, 2:06 PM  Clinical Narrative: Per LTACH rep [place] review with [employer] has again been denied. Faxed out again to SNF-await bed offers. Provided attending with tel#'s [employer] to discuss concerns. Leadership also aware.  5:41p-Provided attending w/tel#s for [employer]-Provider ine [phone]. [facility] offering a bed-   TOC to f/u.   Expected Discharge Plan: Skilled Nursing Facility Barriers to Discharge: Continued Medical Work up  Expected Discharge Plan and Services Expected Discharge Plan: [name]   Discharge Planning Services: CM Consult Post Acute Care Choice: [place] Living arrangements for the past 2 months: Single Family Home                               Date [place]: 12/30/20 Time [place]: 2263     Social Determinants of Health (SDOH) Interventions    Readmission Risk Interventions No flowsheet data found.

## 2021-01-02 NOTE — Progress Notes (Signed)
Peripherally Inserted Central Catheter Placement  The IV Nurse has discussed with the patient and/or persons authorized to consent for the patient, the purpose of this procedure and the potential benefits and risks involved with this procedure.  The benefits include less needle sticks, lab draws from the catheter, and the patient may be discharged home with the catheter. Risks include, but not limited to, infection, bleeding, blood clot (thrombus formation), and puncture of an artery; nerve damage and irregular heartbeat and possibility to perform a PICC exchange if needed/ordered by physician.  Alternatives to this procedure were also discussed.  Bard Power PICC patient education guide, fact sheet on infection prevention and patient information card has been provided to patient /or left at bedside.    PICC Placement Documentation  PICC Single Lumen 01/02/21 PICC Right Brachial 34 cm 0 cm (Active)  Indication for Insertion or Continuance of Line Prolonged intravenous therapies;Home intravenous therapies (PICC only) 01/02/21 1514  Exposed Catheter (cm) 0 cm 01/02/21 1514  Site Assessment Clean;Dry;Intact 01/02/21 1514  Line Status Flushed;Saline locked;Blood return noted 01/02/21 1514  Dressing Type Transparent 01/02/21 1514  Dressing Status Clean;Dry;Intact 01/02/21 1514  Antimicrobial disc in place? Yes 01/02/21 1514  Safety Lock Not Applicable 05/69/79 4801  Line Care Connections checked and tightened 01/02/21 1514  Line Adjustment (NICU/IV Team Only) No 01/02/21 1514  Dressing Intervention New dressing 01/02/21 6553  Dressing Change Due 01/09/21 01/02/21 [place], Nicolette Bang 01/02/2021, 3:15 PM

## 2021-01-02 NOTE — Progress Notes (Signed)
Inpatient Diabetes Program Recommendations  AACE/ADA: New Consensus Statement on Inpatient Glycemic Control (2015)  Target Ranges:  Prepandial:   less than 140 mg/dL      Peak postprandial:   less than 180 mg/dL (1-2 hours)      Critically ill patients:  140 - 180 mg/dL   Lab Results  Component Value Date   GLUCAP 236 (H) 01/02/2021   HGBA1C 8.6 (H) 12/08/2020    Review of Glycemic Control Results for [name], [name] (MRN [id number]) as of 01/02/2021 11:36  Ref. 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  Trends increasing this AM. Consider: -Adding back Lantus 4 units QD -Changing correction to Q4H.   Thanks, Bronson Curb, MSN, RNC-OB Diabetes Coordinator [phone] (8a-5p)

## 2021-01-03 LAB — GLUCOSE, CAPILLARY
Glucose-Capillary: 146 mg/dL — ABNORMAL HIGH (ref 70–99)
Glucose-Capillary: 154 mg/dL — ABNORMAL HIGH (ref 70–99)
Glucose-Capillary: 167 mg/dL — ABNORMAL HIGH (ref 70–99)
Glucose-Capillary: 168 mg/dL — ABNORMAL HIGH (ref 70–99)
Glucose-Capillary: 184 mg/dL — ABNORMAL HIGH (ref 70–99)
Glucose-Capillary: 198 mg/dL — ABNORMAL HIGH (ref 70–99)
Glucose-Capillary: 234 mg/dL — ABNORMAL HIGH (ref 70–99)

## 2021-01-03 LAB — CBC
HCT: 26.5 % — ABNORMAL LOW (ref 36.0–46.0)
Hemoglobin: 8.4 g/dL — ABNORMAL LOW (ref 12.0–15.0)
MCH: 35.6 pg — ABNORMAL HIGH (ref 26.0–34.0)
MCHC: 31.7 g/dL (ref 30.0–36.0)
MCV: 112.3 fL — ABNORMAL HIGH (ref 80.0–100.0)
Platelets: 321 10*3/uL (ref 150–400)
RBC: 2.36 MIL/uL — ABNORMAL LOW (ref 3.87–5.11)
RDW: 23.7 % — ABNORMAL HIGH (ref 11.5–15.5)
WBC: 4.9 10*3/uL (ref 4.0–10.5)
nRBC: 0 % (ref 0.0–0.2)

## 2021-01-03 NOTE — Progress Notes (Signed)
[facility]  PROGRESS NOTE  [name] ZOX:[id number] DOB: 03/24/1961 DOA: 12/07/2020 PCP: [name], MD Admit date - 12/07/2020   Admitting Physician Toy Baker, MD  Outpatient Primary MD for the patient is [name], MD  LOS - 27 Brief Narrative   [name] is a 60 y.o. 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  Hospital course patient initially started on empiric vancomycin, cefepime, Flagyl.  Due to AKI in setting of renal transplant we will switch to daptomycin instead of vancomycin and completed 7 days of therapy of Flagyl and daptomycin (12/13-12/19).  She was then started on Zosyn (12/30-1/3) for presumed aspiration pneumonia (no fever, hypotension, chest x-ray with infiltrate).  She has had prolonged hospitalization to above as well as necessary hydrotherapy for sacral decubitus ulcer.  Subjective  Had PICC line placed yesterday.  States she feels more lethargic today. A & P    Unstageable chronic decubitus ulcer, present on admission, with acute osteomyelitis of sacrum.  Completed 7-day course of vancomycin/daptomycin/Flagyl (12/13-12/19).  Remains afebrile, hemodynamically stable, previous CT showed no osteomyelitis, difficult to see any exposed bone on my exam but wound care note on 12/30 reports visualized exposed bone (no mention in prior note). MRI ( 1/6//21) confirms acute osteomyelitis Risk factor likely immobilization/bed bound status.  Unable  to get bone biopsy as not offered by general surgery -Appreciate ID recommendations, IV ceftriaxone 2 g every 24 and doxycycline 100 mg p.o. twice daily, End date 02/12/2021 - PICC placed 01/02/2021, discussed with on-call nephrologist, Dr. Carolin Sicks and 1/6, who states given stability in creatinine and need for antibiotic therapy okay with line placement -Continue wound dressing care as recommended by wound care nursing  Aspiration pneumonia, resolved.  On room air.  Completed 5-day course of IV Zosyn.  Remains afebrile and hemodynamically stable.  AKI on CKD stage III, status post renal transplant creatinine stable at baseline.  Having normal urine output -Monitor urine output, avoid nephrotoxins, monitor BMP -Continue sodium bicarb -Continue Imuran and cyclosporine  Type 1 diabetes, status post pancreas transplant, poorly controlled recurrent hypoglycemia.  A1c 7.2.  Last hypoglycemic episode on 1/6 with glucose of 34 back in the setting of n.p.o. status for possible procedure at that time.  Was monitored off of long-acting insulin and now fasting CBGs are at goal 150s-160s.  Patient is eating well. - Continue Lantus at reduced dose of 4 units daily,  -Reduced sliding scale to 0 to 6 units every 4 hours allow for better nighttime coverage -Appreciate diabetes coordinator assistance  Chronic macrocytic anemia, stable.  History of B12 deficiency hemoglobin stable at baseline. -Monitor CBC, continue W11 and folic acid supplementation  HTN, stable -Continue Lopressor  Hypothyroidism, stable  -continue Synthroid  Depression, stable -continue Zoloft  Erosive esophagitis, stable -continue PPI     Family Communication  : Husband updated at bedside on 1/7  Code Status : Full  Disposition Plan  :  Patient is from SNF. Anticipated d/c date:  Medically stable for discharge. Barriers to d/c or necessity for inpatient  status:  IV antibiotics via PICC, medically stable for discharge to new  SNF, patient agreeable , TOC will assist over weekend Consults  : Infectious disease, nephrology  Procedures  :   TTE, 12/13 1. Left ventricular ejection fraction, by estimation, is 65 to 70%. The  left ventricle has normal function. The left ventricle has no regional  wall motion abnormalities. There is moderate left ventricular hypertrophy.  Left ventricular diastolic  parameters are consistent with Grade I diastolic dysfunction (impaired  relaxation).  2. Right ventricular systolic function is normal. The right ventricular  size is normal. Mildly increased right ventricular wall thickness.  Tricuspid regurgitation signal is inadequate for assessing PA pressure.  3. The mitral valve is normal in structure. Trivial mitral valve  regurgitation. No evidence of mitral stenosis.  4. The aortic valve was not well visualized. Aortic valve regurgitation  is trivial. No aortic stenosis is present.  5. The inferior vena cava is normal in size with greater than 50%  respiratory variability, suggesting right atrial pressure of 3 mmHg.  6. A small pericardial effusion is present.   DVT Prophylaxis  : SCDs  MDM: The below labs and imaging reports were reviewed and summarized above.  Medication management as above.  Lab Results  Component Value Date   PLT 315 01/01/2021    Diet :  Diet Order            Diet Carb Modified Fluid consistency: Thin; Room service appropriate? Yes  Diet effective now           Diet - low sodium heart healthy                  Inpatient Medications Scheduled Meds: . azaTHIOprine  50 mg Oral Daily  . Chlorhexidine Gluconate Cloth  6 each Topical Daily  . cycloSPORINE  75 mg Oral BID  . doxycycline  100 mg Oral Q12H  . feeding supplement  237 mL Oral BID BM  . folic acid  1 mg Oral Daily  . insulin aspart  0-6 Units Subcutaneous Q4H  . insulin glargine  4 Units Subcutaneous Daily  . levothyroxine  50 mcg Oral Q0600  . liver oil-zinc oxide    Topical Daily  . mouth rinse  15 mL Mouth Rinse BID  . metoprolol tartrate  12.5 mg Oral BID  . multivitamin with minerals  1 tablet Oral Daily  . nutrition supplement (JUVEN)  1 packet Oral BID BM  . nystatin   Topical TID  . pantoprazole  40 mg Oral BID  . polyethylene glycol  17 g Oral Daily  . rosuvastatin  5 mg Oral QHS  . senna-docusate  2 tablet Oral BID  . sertraline  25 mg Oral Daily  . sodium bicarbonate  650 mg Oral BID  . vitamin B-12  100 mcg Oral Daily   Continuous Infusions: . sodium chloride 250 mL (12/27/20 0514)  . cefTRIAXone (ROCEPHIN)  IV 2 g (01/02/21 1802)   PRN Meds:.sodium chloride, acetaminophen **OR** acetaminophen, HYDROcodone-acetaminophen, hydrocortisone cream, ondansetron (ZOFRAN) IV, polyvinyl alcohol, sodium chloride flush  Antibiotics  :   Anti-infectives (From admission, onward)   Start     Dose/Rate Route Frequency Ordered Stop   01/01/21 2200  doxycycline (VIBRA-TABS) tablet 100 mg        100 mg Oral Every 12 hours 01/01/21 1626     01/01/21 1715  cefTRIAXone (ROCEPHIN) 2 g in sodium chloride 0.9 % 100 mL IVPB  2 g 200 mL/hr over 30 Minutes Intravenous Every 24 hours 01/01/21 1626     12/25/20 1530  piperacillin-tazobactam (ZOSYN) IVPB 3.375 g        3.375 g 12.5 mL/hr over 240 Minutes Intravenous Every 8 hours 12/25/20 1421 12/30/20 1001   12/11/20 1500  ceFEPIme (MAXIPIME) 2 g in sodium chloride 0.9 % 100 mL IVPB        2 g 200 mL/hr over 30 Minutes Intravenous Every 12 hours 12/11/20 1358 12/14/20 1730   12/10/20 2000  DAPTOmycin (CUBICIN) 300 mg in sodium chloride 0.9 % IVPB        300 mg 212 mL/hr over 30 Minutes Intravenous Daily 12/10/20 0946 12/14/20 2250   12/08/20 2100  vancomycin (VANCOREADY) IVPB 500 mg/100 mL  Status:  Discontinued        500 mg 100 mL/hr over 60 Minutes Intravenous Every 24 hours 12/08/20 0040 12/10/20 0946   12/08/20 2000  ceFEPIme (MAXIPIME) 2 g in sodium chloride 0.9 % 100 mL IVPB  Status:   Discontinued        2 g 200 mL/hr over 30 Minutes Intravenous Every 24 hours 12/08/20 0036 12/11/20 1358   12/08/20 1400  metroNIDAZOLE (FLAGYL) tablet 500 mg        500 mg Oral Every 8 hours 12/08/20 1049 12/14/20 2210   12/08/20 0600  metroNIDAZOLE (FLAGYL) IVPB 500 mg  Status:  Discontinued        500 mg 100 mL/hr over 60 Minutes Intravenous Every 8 hours 12/07/20 2248 12/08/20 1049   12/07/20 2030  ceFEPIme (MAXIPIME) 2 g in sodium chloride 0.9 % 100 mL IVPB        2 g 200 mL/hr over 30 Minutes Intravenous  Once 12/07/20 2018 12/07/20 2105   12/07/20 2030  metroNIDAZOLE (FLAGYL) IVPB 500 mg        500 mg 100 mL/hr over 60 Minutes Intravenous  Once 12/07/20 2018 12/07/20 2321   12/07/20 2030  vancomycin (VANCOCIN) IVPB 1000 mg/200 mL premix        1,000 mg 200 mL/hr over 60 Minutes Intravenous  Once 12/07/20 2018 12/07/20 2212       Objective   Vitals:   01/02/21 1334 01/02/21 2139 01/03/21 0549 01/03/21 1313  BP: 130/65 136/67 (!) 141/64 134/62  Pulse: 76 77 72 79  Resp: 20 17 18 13   Temp: 98.3 F (36.8 C) 97.9 F (36.6 C) 98.1 F (36.7 C) 98.5 F (36.9 C)  TempSrc: Oral   Oral  SpO2: 97% 97% 97% 95%  Weight:      Height:        SpO2: 95 %  Wt Readings from Last 3 Encounters:  01/02/21 72 kg  11/12/20 58.8 kg  11/02/20 64.4 kg     Intake/Output Summary (Last 24 hours) at 01/03/2021 1355 Last data filed at 01/03/2021 0945 Gross per 24 hour  Intake 480 ml  Output 1200 ml  Net -720 ml    Physical Exam:     Awake Alert, Oriented X 3, Normal affect No new F.N deficits,  [place].AT, Normal respiratory effort on room air, CTAB RRR,No Gallops,Rubs or new Murmurs,  +ve B.Sounds, Abd Soft, No tenderness, No rebound, guarding or rigidity. No Cyanosis, No new Rash or bruise         I have personally reviewed the following:   Data Reviewed:  CBC Recent Labs  Lab 12/28/20 0527 12/31/20 0830 01/01/21 0609  WBC 3.6* 5.0 4.6  HGB 8.9* 8.2* 8.1*  HCT  27.7* 26.2* 26.5*  PLT 245 311 315  MCV 111.7* 112.4* 113.2*  MCH 35.9* 35.2* 34.6*  MCHC 32.1 31.3 30.6  RDW 24.9* 25.0* 24.4*    Chemistries  Recent Labs  Lab 12/28/20 0527 12/31/20 0830 01/01/21 0609  NA 138 138 135  K 5.0 4.7 5.1  CL 104 101 100  CO2 24 27 27   GLUCOSE 119* 140* 177*  BUN 46* 59* 67*  CREATININE 0.96 1.05* 0.97  CALCIUM 9.4 9.5 9.4  MG  --  2.0  --   AST  --   --  15  ALT  --   --  12  ALKPHOS  --   --  103  BILITOT  --   --  0.5   ------------------------------------------------------------------------------------------------------------------ No results for input(s): CHOL, HDL, LDLCALC, TRIG, CHOLHDL, LDLDIRECT in the last 72 hours.  Lab Results  Component Value Date   HGBA1C 8.6 (H) 12/08/2020   ------------------------------------------------------------------------------------------------------------------ No results for input(s): TSH, T4TOTAL, T3FREE, THYROIDAB in the last 72 hours.  Invalid input(s): FREET3 ------------------------------------------------------------------------------------------------------------------ No results for input(s): VITAMINB12, FOLATE, FERRITIN, TIBC, IRON, RETICCTPCT in the last 72 hours.  Coagulation profile No results for input(s): INR, PROTIME in the last 168 hours.  No results for input(s): DDIMER in the last 72 hours.  Cardiac Enzymes No results for input(s): CKMB, TROPONINI, MYOGLOBIN in the last 168 hours.  Invalid input(s): CK ------------------------------------------------------------------------------------------------------------------ No results found for: BNP  Micro Results Recent Results (from the past 240 hour(s))  Culture, blood (Routine X 2) w Reflex to ID Panel     Status: None   Collection Time: 12/25/20  1:26 PM   Specimen: BLOOD LEFT HAND  Result Value Ref Range Status   Specimen Description   Final    BLOOD LEFT HAND Performed at [facility], [place]  [address], [place], [place] [address]    Special Requests   Final    BOTTLES DRAWN AEROBIC ONLY Blood Culture adequate volume Performed at [facility] [address] [place], [place] [address]    Culture   Final    NO GROWTH 5 DAYS Performed at [facility], [place] [address] [place], [place] [address]    Report Status 12/30/2020 FINAL  Final  Culture, blood (Routine X 2) w Reflex to ID Panel     Status: None   Collection Time: 12/25/20  1:26 PM   Specimen: BLOOD RIGHT HAND  Result Value Ref Range Status   Specimen Description   Final    BLOOD RIGHT HAND Performed at [facility] [address] [place], [place] [address]    Special Requests   Final    BOTTLES DRAWN AEROBIC ONLY Blood Culture adequate volume Performed at [place] [address] [place], [place] [address]    Culture   Final    NO GROWTH 5 DAYS Performed at [facility], [place] [address] [place],  [address]    Report Status 12/30/2020 FINAL  Final    Radiology Reports DG Tibia/Fibula Right  Result Date: 12/31/2020 CLINICAL DATA:  Amputation stump pain, bleeding, per elite wound EXAM: RIGHT TIBIA AND FIBULA - 2 VIEW COMPARISON:  None. FINDINGS: Status post below the knee amputation of the right tibia and fibula. No bony erosion or sclerosis. Soft tissue wound of the stump tip. Vascular calcinosis. The included knee joint is well preserved. IMPRESSION: Status post below the knee amputation of the right tibia and fibula.  No bony erosion or sclerosis to suggest osteomyelitis. Soft tissue wound of the stump tip. MRI is the test of choice for the evaluation of bone marrow edema and osteomyelitis if clinically suspected. Electronically Signed   By: Eddie Candle M.D.   On: 12/31/2020 16:41   CT PELVIS WO CONTRAST  Result Date: 12/07/2020 CLINICAL DATA:  Sacral ulcer, concern for osteomyelitis EXAM: CT PELVIS WITHOUT CONTRAST TECHNIQUE:  Multidetector CT imaging of the pelvis was performed following the standard protocol without intravenous contrast. COMPARISON:  None. FINDINGS: Urinary Tract: Urinary bladder is markedly distended to the level of the umbilicus. There appears to be a renal transplant kidney in the cul-de-sac of the pelvis. Mild caliectasis. Bowel:  Unremarkable visualized pelvic bowel loops. Vascular/Lymphatic: Heavily calcified aorta and iliac vessels. No adenopathy. Reproductive:  No visible pelvic mass. Other:  No free fluid or free air. Musculoskeletal: Sacral decubital ulcer noted. No bone destruction in the sacrum or coccyx to suggest osteomyelitis. Prior right hip replacement. IMPRESSION: Sacral decubitus ulcer.  No evidence of osteomyelitis. Apparent renal transplant within the cul-de-sac of the pelvis. Mild caliectasis noted within the renal transplant. Urinary bladder is markedly distended to the level of the umbilicus. Aortoiliac atherosclerosis. Electronically Signed   By: Rolm Baptise M.D.   On: 12/07/2020 21:51   MR PELVIS W WO CONTRAST  Result Date: 01/01/2021 CLINICAL DATA:  Chronic sacral decubitus ulcer EXAM: MRI PELVIS WITHOUT AND WITH CONTRAST TECHNIQUE: Multiplanar multisequence MR imaging of the pelvis was performed both before and after administration of intravenous contrast. CONTRAST:  55mL GADAVIST GADOBUTROL 1 MMOL/ML IV SOLN COMPARISON:  CT 12/07/2020 FINDINGS: Sacral decubitus ulcer overlies the mid to distal aspect of the sacrum. Thin fluid and air collection contiguous with the ulcer base extend into the dorsal cortex of the sacrum. Bone marrow edema and confluent low T1 signal is present within the distal sacrum extending from S4 to the sacrococcygeal junction (series 8, images 20-24). Bone marrow edema extends from the S3 segment through the distal coccyx (series 4, images 19-21). Bilateral SI joints are intact. No SI joint effusion or erosive changes to suggest septic arthritis. No fracture or  pelvic diastasis. There is susceptibility artifact related to right total hip arthroplasty hardware. No appreciable periprosthetic fluid collection within the limitations of the exam. Diffuse intramuscular edema throughout the visualized musculature of the pelvis compatible with a nonspecific myositis. No intramuscular fluid collection. Diffuse anasarca. Transplant kidney is seen within the low pelvis with parenchymal enhancement on postcontrast sequences. Foley catheter is present within the bladder lumen. IMPRESSION: 1. Sacral decubitus ulcer with acute osteomyelitis of the distal sacrum extending from S4 to the sacrococcygeal junction. 2. Bone marrow edema extends from the S3 segment through the distal coccyx, the peripheral extent of which may represent reactive osteitis versus early acute osteomyelitis. 3. Diffuse intramuscular edema throughout the visualized musculature of the pelvis compatible with a nonspecific myositis. No intramuscular fluid collection. 4. Diffuse anasarca. These results will be called to the ordering clinician or representative by the Radiologist Assistant, and communication documented in the PACS or [employer]. Electronically Signed   By: Davina Poke D.O.   On: 01/01/2021 08:23   DG CHEST PORT 1 VIEW  Result Date: 12/25/2020 CLINICAL DATA:  Infected sacral decubitus ulcer. EXAM: PORTABLE CHEST 1 VIEW COMPARISON:  12/07/2020 FINDINGS: Poor inspiration with increased patchy and linear density at both lung bases, left greater than right. The remainder of the lungs are clear with normal vascularity. Normal sized heart. Left  axillary surgical clips. Moderate left and mild right glenohumeral joint degenerative changes. IMPRESSION: Poor inspiration with bibasilar atelectasis and possible pneumonia, left greater than right. Electronically Signed   By: Claudie Revering M.D.   On: 12/25/2020 13:22   DG Chest Port 1 View  Result Date: 12/07/2020 CLINICAL DATA:  Questionable sepsis  - evaluate for abnormality Altered mental status. EXAM: PORTABLE CHEST 1 VIEW COMPARISON:  Radiograph 09/26/2020. FINDINGS: Lung volumes are low. There are streaky opacities at the left lung base with ill-defined left hemidiaphragm. Stable heart size and mediastinal contours allowing for differences in positioning. No pulmonary edema. There is no pneumothorax or large pleural effusion. Remote right rib fractures. Bones are diffusely under mineralized. IMPRESSION: Low lung volumes with streaky opacities at the left lung base, which may represent atelectasis or pneumonia. Electronically Signed   By: Keith Rake M.D.   On: 12/07/2020 21:00   ECHOCARDIOGRAM COMPLETE  Result Date: 12/08/2020    ECHOCARDIOGRAM REPORT   Patient Name:   LIBRADA CASTRONOVO Date of Exam: 12/08/2020 Medical Rec #:  [id number]       Height:       63.0 in Accession #:    [id number]      Weight:       115.0 lb Date of Birth:  [birth year]      BSA:          1.528 m Patient Age:    69 years        BP:           130/71 mmHg Patient Gender: F               HR:           85 bpm. Exam Location:  Inpatient Procedure: 2D Echo, Color Doppler and Cardiac Doppler Indications:    R94.31 Abnormal EKG  History:        Patient has no prior history of Echocardiogram examinations.                 Risk Factors:Diabetes.  Sonographer:    Bernadene Person RDCS Referring Phys: [place]  1. Left ventricular ejection fraction, by estimation, is 65 to 70%. The left ventricle has normal function. The left ventricle has no regional wall motion abnormalities. There is moderate left ventricular hypertrophy. Left ventricular diastolic parameters are consistent with Grade I diastolic dysfunction (impaired relaxation).  2. Right ventricular systolic function is normal. The right ventricular size is normal. Mildly increased right ventricular wall thickness. Tricuspid regurgitation signal is inadequate for assessing PA pressure.  3. The mitral  valve is normal in structure. Trivial mitral valve regurgitation. No evidence of mitral stenosis.  4. The aortic valve was not well visualized. Aortic valve regurgitation is trivial. No aortic stenosis is present.  5. The inferior vena cava is normal in size with greater than 50% respiratory variability, suggesting right atrial pressure of 3 mmHg.  6. A small pericardial effusion is present. FINDINGS  Left Ventricle: Left ventricular ejection fraction, by estimation, is 65 to 70%. The left ventricle has normal function. The left ventricle has no regional wall motion abnormalities. The left ventricular internal cavity size was normal in size. There is  moderate left ventricular hypertrophy. Left ventricular diastolic parameters are consistent with Grade I diastolic dysfunction (impaired relaxation). Right Ventricle: The right ventricular size is normal. Mildly increased right ventricular wall thickness. Right ventricular systolic function is normal. Tricuspid regurgitation signal is inadequate for assessing PA pressure. Left Atrium: Left  atrial size was normal in size. Right Atrium: Right atrial size was normal in size. Pericardium: A small pericardial effusion is present. Mitral Valve: The mitral valve is normal in structure. Trivial mitral valve regurgitation. No evidence of mitral valve stenosis. Tricuspid Valve: The tricuspid valve is normal in structure. Tricuspid valve regurgitation is not demonstrated. Aortic Valve: The aortic valve was not well visualized. Aortic valve regurgitation is trivial. Aortic regurgitation PHT measures 520 msec. No aortic stenosis is present. Pulmonic Valve: The pulmonic valve was normal in structure. Pulmonic valve regurgitation is not visualized. Aorta: The aortic root is normal in size and structure. Venous: The inferior vena cava is normal in size with greater than 50% respiratory variability, suggesting right atrial pressure of 3 mmHg. IAS/Shunts: No atrial level shunt detected  by color flow Doppler.  LEFT VENTRICLE PLAX 2D LVIDd:         2.50 cm  Diastology LVIDs:         1.20 cm  LV e' medial:    3.59 cm/s LV PW:         1.70 cm  LV E/e' medial:  15.8 LV IVS:        1.70 cm  LV e' lateral:   3.81 cm/s LVOT diam:     1.80 cm  LV E/e' lateral: 14.9 LV SV:         48 LV SV Index:   31 LVOT Area:     2.54 cm  RIGHT VENTRICLE RV S prime:     18.30 cm/s TAPSE (M-mode): 1.4 cm LEFT ATRIUM             Index      RIGHT ATRIUM          Index LA diam:        3.30 cm 2.16 cm/m RA Area:     3.33 cm LA Vol (A2C):   11.4 ml 7.46 ml/m RA Volume:   3.61 ml  2.36 ml/m LA Vol (A4C):   8.6 ml  5.62 ml/m LA Biplane Vol: 10.1 ml 6.61 ml/m  AORTIC VALVE LVOT Vmax:   96.70 cm/s LVOT Vmean:  70.700 cm/s LVOT VTI:    0.188 m AI PHT:      520 msec  AORTA Ao Root diam: 3.30 cm Ao Asc diam:  2.60 cm MITRAL VALVE MV Area (PHT): 2.56 cm    SHUNTS MV Decel Time: 296 msec    Systemic VTI:  0.19 m MV E velocity: 56.60 cm/s  Systemic Diam: 1.80 cm MV A velocity: 72.00 cm/s MV E/A ratio:  0.79 Loralie Champagne MD Electronically signed by Loralie Champagne MD Signature Date/Time: 12/08/2020/5:15:53 PM    Final    [place] EKG SITE RITE  Result Date: 01/01/2021 If Site Rite image not attached, placement could not be confirmed due to current cardiac rhythm.    Time Spent in minutes  30     Desiree Hane M.D on 01/03/2021 at 1:55 PM  To page go to [email or web address] - password [email or web address]

## 2021-01-04 LAB — GLUCOSE, CAPILLARY
Glucose-Capillary: 118 mg/dL — ABNORMAL HIGH (ref 70–99)
Glucose-Capillary: 97 mg/dL (ref 70–99)
Glucose-Capillary: 169 mg/dL — ABNORMAL HIGH (ref 70–99)
Glucose-Capillary: 161 mg/dL — ABNORMAL HIGH (ref 70–99)
Glucose-Capillary: 167 mg/dL — ABNORMAL HIGH (ref 70–99)

## 2021-01-04 NOTE — Progress Notes (Signed)
Patient refused nighttime Senna. Patient's current order for Senna is 2 tablets by mouth twice daily. Patient requested that she only get Senna 1 tablet by mouth at bedtime. Notified on call provider to see if the order can be changed. Will pass on to day shift nurse to see if the doctor can make modifications to the medication.

## 2021-01-04 NOTE — Plan of Care (Signed)
  Problem: Nutrition: Goal: Adequate nutrition will be maintained Outcome: Progressing   Problem: Coping: Goal: Level of anxiety will decrease Outcome: Progressing   Problem: Safety: Goal: Ability to remain free from injury will improve Outcome: Progressing   Problem: Skin Integrity: Goal: Risk for impaired skin integrity will decrease Outcome: Progressing   Problem: Clinical Measurements: Goal: Postoperative complications will be avoided or minimized Outcome: Progressing   Problem: Pain Management: Goal: Pain level will decrease with appropriate interventions Outcome: Progressing

## 2021-01-04 NOTE — Progress Notes (Signed)
[facility]  PROGRESS NOTE  [name] BOF:[id number] DOB: 1961/05/21 DOA: 12/07/2020 PCP: [name], MD Admit date - 12/07/2020   Admitting Physician Toy Baker, MD  Outpatient Primary MD for the patient is [name], MD  LOS - 28 Brief Narrative   [name] is a 60 y.o. 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  Hospital course patient initially started on empiric vancomycin, cefepime, Flagyl.  Due to AKI in setting of renal transplant we will switch to daptomycin instead of vancomycin and completed 7 days of therapy of Flagyl and daptomycin (12/13-12/19).  She was then started on Zosyn (12/30-1/3) for presumed aspiration pneumonia (no fever, hypotension, chest x-ray with infiltrate).  She has had prolonged hospitalization to above as well as necessary hydrotherapy for sacral decubitus ulcer.  Subjective  Still having back pain.  A & P    Unstageable chronic decubitus ulcer, present on admission, with acute osteomyelitis of sacrum.  Completed 7-day course of vancomycin/daptomycin/Flagyl (12/13-12/19).  Remains afebrile, hemodynamically stable, previous CT showed no osteomyelitis, difficult to see any exposed bone on my exam but wound care note on 12/30 reports visualized exposed bone (no mention in prior note). MRI ( 1/6//21) confirms acute osteomyelitis Risk factor likely immobilization/bed bound status.  Unable to get bone biopsy as not offered by general  surgery -Appreciate ID recommendations, IV ceftriaxone 2 g every 24 and doxycycline 100 mg p.o. twice daily, End date 02/12/2021 - PICC placed 01/02/2021, discussed with on-call nephrologist, Dr. Carolin Sicks and 1/6, who states given stability in creatinine and need for antibiotic therapy okay with line placement -Continue wound dressing care as recommended by wound care nursing  Aspiration pneumonia, resolved.  On room air.  Completed 5-day course of IV Zosyn.  Remains afebrile and hemodynamically stable.  AKI on CKD stage III, status post renal transplant creatinine stable at baseline.  Having normal urine output -Monitor urine output, avoid nephrotoxins, monitor BMP -Continue sodium bicarb -Continue Imuran and cyclosporine  Type 1 diabetes, status post pancreas transplant, poorly controlled recurrent hypoglycemia.  A1c 7.2.  Last hypoglycemic episode on 1/6 with glucose of 34 back in the setting of n.p.o. status for possible procedure at that time.  Was monitored off of long-acting insulin and now fasting CBGs are at goal 150s-160s.  Patient is eating well. - Continue Lantus at reduced dose of 4 units daily,  -Reduced sliding scale to 0 to 6 units every 4 hours allow for better nighttime coverage -Appreciate diabetes coordinator assistance  Chronic macrocytic anemia, stable.  History of B12 deficiency hemoglobin stable at baseline. -Monitor CBC, continue D78 and folic acid supplementation  HTN, stable -Continue Lopressor  Hypothyroidism, stable  -continue Synthroid  Depression, stable -continue Zoloft  Erosive esophagitis, stable -continue PPI     Family Communication  : Husband updated at bedside on 1/7  Code Status : Full  Disposition Plan  :  Patient is from SNF. Anticipated d/c date:  Medically stable for discharge. Barriers to d/c or necessity for inpatient status:  IV antibiotics via PICC, medically  stable for discharge to new SNF, patient agreeable , TOC will assist over  weekend Consults  : Infectious disease, nephrology  Procedures  :   TTE, 12/13 1. Left ventricular ejection fraction, by estimation, is 65 to 70%. The  left ventricle has normal function. The left ventricle has no regional  wall motion abnormalities. There is moderate left ventricular hypertrophy.  Left ventricular diastolic  parameters are consistent with Grade I diastolic dysfunction (impaired  relaxation).  2. Right ventricular systolic function is normal. The right ventricular  size is normal. Mildly increased right ventricular wall thickness.  Tricuspid regurgitation signal is inadequate for assessing PA pressure.  3. The mitral valve is normal in structure. Trivial mitral valve  regurgitation. No evidence of mitral stenosis.  4. The aortic valve was not well visualized. Aortic valve regurgitation  is trivial. No aortic stenosis is present.  5. The inferior vena cava is normal in size with greater than 50%  respiratory variability, suggesting right atrial pressure of 3 mmHg.  6. A small pericardial effusion is present.   DVT Prophylaxis  : SCDs  MDM: The below labs and imaging reports were reviewed and summarized above.  Medication management as above.  Lab Results  Component Value Date   PLT 321 01/03/2021    Diet :  Diet Order            Diet Carb Modified Fluid consistency: Thin; Room service appropriate? Yes  Diet effective now           Diet - low sodium heart healthy                  Inpatient Medications Scheduled Meds: . azaTHIOprine  50 mg Oral Daily  . Chlorhexidine Gluconate Cloth  6 each Topical Daily  . cycloSPORINE  75 mg Oral BID  . doxycycline  100 mg Oral Q12H  . feeding supplement  237 mL Oral BID BM  . folic acid  1 mg Oral Daily  . insulin aspart  0-6 Units Subcutaneous Q4H  . insulin glargine  4 Units Subcutaneous Daily  . levothyroxine  50 mcg Oral Q0600  . liver oil-zinc oxide   Topical Daily  . mouth rinse  15 mL Mouth Rinse  BID  . metoprolol tartrate  12.5 mg Oral BID  . multivitamin with minerals  1 tablet Oral Daily  . nutrition supplement (JUVEN)  1 packet Oral BID BM  . nystatin   Topical TID  . pantoprazole  40 mg Oral BID  . polyethylene glycol  17 g Oral Daily  . rosuvastatin  5 mg Oral QHS  . senna-docusate  2 tablet Oral BID  . sertraline  25 mg Oral Daily  . sodium bicarbonate  650 mg Oral BID  . vitamin B-12  100 mcg Oral Daily   Continuous Infusions: . sodium chloride 250 mL (01/03/21 1614)  . cefTRIAXone (ROCEPHIN)  IV 2 g (01/04/21 1720)   PRN Meds:.sodium chloride, acetaminophen **OR** acetaminophen, HYDROcodone-acetaminophen, hydrocortisone cream, ondansetron (ZOFRAN) IV, polyvinyl alcohol, sodium chloride flush  Antibiotics  :   Anti-infectives (From admission, onward)   Start     Dose/Rate Route Frequency Ordered Stop   01/01/21 2200  doxycycline (VIBRA-TABS) tablet 100 mg        100 mg Oral Every 12 hours 01/01/21 1626     01/01/21 1715  cefTRIAXone (ROCEPHIN) 2 g in sodium chloride 0.9 % 100 mL IVPB        2 g 200 mL/hr over  30 Minutes Intravenous Every 24 hours 01/01/21 1626     12/25/20 1530  piperacillin-tazobactam (ZOSYN) IVPB 3.375 g        3.375 g 12.5 mL/hr over 240 Minutes Intravenous Every 8 hours 12/25/20 1421 12/30/20 1001   12/11/20 1500  ceFEPIme (MAXIPIME) 2 g in sodium chloride 0.9 % 100 mL IVPB        2 g 200 mL/hr over 30 Minutes Intravenous Every 12 hours 12/11/20 1358 12/14/20 1730   12/10/20 2000  DAPTOmycin (CUBICIN) 300 mg in sodium chloride 0.9 % IVPB        300 mg 212 mL/hr over 30 Minutes Intravenous Daily 12/10/20 0946 12/14/20 2250   12/08/20 2100  vancomycin (VANCOREADY) IVPB 500 mg/100 mL  Status:  Discontinued        500 mg 100 mL/hr over 60 Minutes Intravenous Every 24 hours 12/08/20 0040 12/10/20 0946   12/08/20 2000  ceFEPIme (MAXIPIME) 2 g in sodium chloride 0.9 % 100 mL IVPB  Status:  Discontinued        2 g 200 mL/hr over 30 Minutes  Intravenous Every 24 hours 12/08/20 0036 12/11/20 1358   12/08/20 1400  metroNIDAZOLE (FLAGYL) tablet 500 mg        500 mg Oral Every 8 hours 12/08/20 1049 12/14/20 2210   12/08/20 0600  metroNIDAZOLE (FLAGYL) IVPB 500 mg  Status:  Discontinued        500 mg 100 mL/hr over 60 Minutes Intravenous Every 8 hours 12/07/20 2248 12/08/20 1049   12/07/20 2030  ceFEPIme (MAXIPIME) 2 g in sodium chloride 0.9 % 100 mL IVPB        2 g 200 mL/hr over 30 Minutes Intravenous  Once 12/07/20 2018 12/07/20 2105   12/07/20 2030  metroNIDAZOLE (FLAGYL) IVPB 500 mg        500 mg 100 mL/hr over 60 Minutes Intravenous  Once 12/07/20 2018 12/07/20 2321   12/07/20 2030  vancomycin (VANCOCIN) IVPB 1000 mg/200 mL premix        1,000 mg 200 mL/hr over 60 Minutes Intravenous  Once 12/07/20 2018 12/07/20 2212       Objective   Vitals:   01/04/21 0416 01/04/21 0500 01/04/21 0934 01/04/21 1312  BP: 128/67  130/65 119/66  Pulse: 76  81 80  Resp: 14   (!) 22  Temp: 97.9 F (36.6 C)   98.1 F (36.7 C)  TempSrc: Oral   Oral  SpO2: 96%   94%  Weight:  72 kg    Height:        SpO2: 94 %  Wt Readings from Last 3 Encounters:  01/04/21 72 kg  11/12/20 58.8 kg  11/02/20 64.4 kg     Intake/Output Summary (Last 24 hours) at 01/04/2021 1824 Last data filed at 01/04/2021 1715 Gross per 24 hour  Intake 1456.05 ml  Output 1450 ml  Net 6.05 ml    Physical Exam:     Awake Alert, Oriented X 3, Normal affect No new F.N deficits,  Gross.AT, Normal respiratory effort on room air, CTAB RRR,No Gallops,Rubs or new Murmurs,  +ve B.Sounds, Abd Soft, No tenderness, No rebound, guarding or rigidity. No Cyanosis, No new Rash or bruise  01/04/21           I have personally reviewed the following:   Data Reviewed:  CBC Recent Labs  Lab 12/31/20 0830 01/01/21 0609 01/03/21 1600  WBC 5.0 4.6 4.9  HGB 8.2* 8.1* 8.4*  HCT 26.2* 26.5* 26.5*  PLT 311 315 321  MCV 112.4* 113.2* 112.3*  MCH 35.2* 34.6*  35.6*  MCHC 31.3 30.6 31.7  RDW 25.0* 24.4* 23.7*    Chemistries  Recent Labs  Lab 12/31/20 0830 01/01/21 0609  NA 138 135  K 4.7 5.1  CL 101 100  CO2 27 27  GLUCOSE 140* 177*  BUN 59* 67*  CREATININE 1.05* 0.97  CALCIUM 9.5 9.4  MG 2.0  --   AST  --  15  ALT  --  12  ALKPHOS  --  103  BILITOT  --  0.5   ------------------------------------------------------------------------------------------------------------------ No results for input(s): CHOL, HDL, LDLCALC, TRIG, CHOLHDL, LDLDIRECT in the last 72 hours.  Lab Results  Component Value Date   HGBA1C 8.6 (H) 12/08/2020   ------------------------------------------------------------------------------------------------------------------ No results for input(s): TSH, T4TOTAL, T3FREE, THYROIDAB in the last 72 hours.  Invalid input(s): FREET3 ------------------------------------------------------------------------------------------------------------------ No results for input(s): VITAMINB12, FOLATE, FERRITIN, TIBC, IRON, RETICCTPCT in the last 72 hours.  Coagulation profile No results for input(s): INR, PROTIME in the last 168 hours.  No results for input(s): DDIMER in the last 72 hours.  Cardiac Enzymes No results for input(s): CKMB, TROPONINI, MYOGLOBIN in the last 168 hours.  Invalid input(s): CK ------------------------------------------------------------------------------------------------------------------ No results found for: BNP  Micro Results No results found for this or any previous visit (from the past 240 hour(s)).  Radiology Reports DG Tibia/Fibula Right  Result Date: 12/31/2020 CLINICAL DATA:  Amputation stump pain, bleeding, per [facility] wound EXAM: RIGHT TIBIA AND FIBULA - 2 VIEW COMPARISON:  None. FINDINGS: Status post below the knee amputation of the right tibia and fibula. No bony erosion or sclerosis. Soft tissue wound of the stump tip. Vascular calcinosis. The included knee joint is well preserved.  IMPRESSION: Status post below the knee amputation of the right tibia and fibula. No bony erosion or sclerosis to suggest osteomyelitis. Soft tissue wound of the stump tip. MRI is the test of choice for the evaluation of bone marrow edema and osteomyelitis if clinically suspected. Electronically Signed   By: Eddie Candle M.D.   On: 12/31/2020 16:41   CT PELVIS WO CONTRAST  Result Date: 12/07/2020 CLINICAL DATA:  Sacral ulcer, concern for osteomyelitis EXAM: CT PELVIS WITHOUT CONTRAST TECHNIQUE: Multidetector CT imaging of the pelvis was performed following the standard protocol without intravenous contrast. COMPARISON:  None. FINDINGS: Urinary Tract: Urinary bladder is markedly distended to the level of the umbilicus. There appears to be a renal transplant kidney in the cul-de-sac of the pelvis. Mild caliectasis. Bowel:  Unremarkable visualized pelvic bowel loops. Vascular/Lymphatic: Heavily calcified aorta and iliac vessels. No adenopathy. Reproductive:  No visible pelvic mass. Other:  No free fluid or free air. Musculoskeletal: Sacral decubital ulcer noted. No bone destruction in the sacrum or coccyx to suggest osteomyelitis. Prior right hip replacement. IMPRESSION: Sacral decubitus ulcer.  No evidence of osteomyelitis. Apparent renal transplant within the cul-de-sac of the pelvis. Mild caliectasis noted within the renal transplant. Urinary bladder is markedly distended to the level of the umbilicus. Aortoiliac atherosclerosis. Electronically Signed   By: Rolm Baptise M.D.   On: 12/07/2020 21:51   MR PELVIS W WO CONTRAST  Result Date: 01/01/2021 CLINICAL DATA:  Chronic sacral decubitus ulcer EXAM: MRI PELVIS WITHOUT AND WITH CONTRAST TECHNIQUE: Multiplanar multisequence MR imaging of the pelvis was performed both before and after administration of intravenous contrast. CONTRAST:  61mL GADAVIST GADOBUTROL 1 MMOL/ML IV SOLN COMPARISON:  CT 12/07/2020 FINDINGS: Sacral decubitus ulcer overlies the mid to distal  aspect of the sacrum. Thin fluid and air collection contiguous with the ulcer base extend into the dorsal cortex of the sacrum. Bone marrow edema and confluent low T1 signal is present within the distal sacrum extending from S4 to the sacrococcygeal junction (series 8, images 20-24). Bone marrow edema extends from the S3 segment through the distal coccyx (series 4, images 19-21). Bilateral SI joints are intact. No SI joint effusion or erosive changes to suggest septic arthritis. No fracture or pelvic diastasis. There is susceptibility artifact related to right total hip arthroplasty hardware. No appreciable periprosthetic fluid collection within the limitations of the exam. Diffuse intramuscular edema throughout the visualized musculature of the pelvis compatible with a nonspecific myositis. No intramuscular fluid collection. Diffuse anasarca. Transplant kidney is seen within the low pelvis with parenchymal enhancement on postcontrast sequences. Foley catheter is present within the bladder lumen. IMPRESSION: 1. Sacral decubitus ulcer with acute osteomyelitis of the distal sacrum extending from S4 to the sacrococcygeal junction. 2. Bone marrow edema extends from the S3 segment through the distal coccyx, the peripheral extent of which may represent reactive osteitis versus early acute osteomyelitis. 3. Diffuse intramuscular edema throughout the visualized musculature of the pelvis compatible with a nonspecific myositis. No intramuscular fluid collection. 4. Diffuse anasarca. These results will be called to the ordering clinician or representative by the Radiologist Assistant, and communication documented in the PACS or [employer]. Electronically Signed   By: Davina Poke D.O.   On: 01/01/2021 08:23   DG CHEST PORT 1 VIEW  Result Date: 12/25/2020 CLINICAL DATA:  Infected sacral decubitus ulcer. EXAM: PORTABLE CHEST 1 VIEW COMPARISON:  12/07/2020 FINDINGS: Poor inspiration with increased patchy and linear  density at both lung bases, left greater than right. The remainder of the lungs are clear with normal vascularity. Normal sized heart. Left axillary surgical clips. Moderate left and mild right glenohumeral joint degenerative changes. IMPRESSION: Poor inspiration with bibasilar atelectasis and possible pneumonia, left greater than right. Electronically Signed   By: Claudie Revering M.D.   On: 12/25/2020 13:22   DG Chest Port 1 View  Result Date: 12/07/2020 CLINICAL DATA:  Questionable sepsis - evaluate for abnormality Altered mental status. EXAM: PORTABLE CHEST 1 VIEW COMPARISON:  Radiograph 09/26/2020. FINDINGS: Lung volumes are low. There are streaky opacities at the left lung base with ill-defined left hemidiaphragm. Stable heart size and mediastinal contours allowing for differences in positioning. No pulmonary edema. There is no pneumothorax or large pleural effusion. Remote right rib fractures. Bones are diffusely under mineralized. IMPRESSION: Low lung volumes with streaky opacities at the left lung base, which may represent atelectasis or pneumonia. Electronically Signed   By: Keith Rake M.D.   On: 12/07/2020 21:00   ECHOCARDIOGRAM COMPLETE  Result Date: 12/08/2020    ECHOCARDIOGRAM REPORT   Patient Name:   JNIYAH DANTUONO Date of Exam: 12/08/2020 Medical Rec #:  [id number]       Height:       63.0 in Accession #:    [id number]      Weight:       115.0 lb Date of Birth:  07-Mar-1961      BSA:          1.528 m Patient Age:    58 years        BP:           130/71 mmHg Patient Gender: F               HR:  85 bpm. Exam Location:  Inpatient Procedure: 2D Echo, Color Doppler and Cardiac Doppler Indications:    R94.31 Abnormal EKG  History:        Patient has no prior history of Echocardiogram examinations.                 Risk Factors:Diabetes.  Sonographer:    Bernadene Person RDCS Referring Phys: [place]  1. Left ventricular ejection fraction, by estimation, is 65 to  70%. The left ventricle has normal function. The left ventricle has no regional wall motion abnormalities. There is moderate left ventricular hypertrophy. Left ventricular diastolic parameters are consistent with Grade I diastolic dysfunction (impaired relaxation).  2. Right ventricular systolic function is normal. The right ventricular size is normal. Mildly increased right ventricular wall thickness. Tricuspid regurgitation signal is inadequate for assessing PA pressure.  3. The mitral valve is normal in structure. Trivial mitral valve regurgitation. No evidence of mitral stenosis.  4. The aortic valve was not well visualized. Aortic valve regurgitation is trivial. No aortic stenosis is present.  5. The inferior vena cava is normal in size with greater than 50% respiratory variability, suggesting right atrial pressure of 3 mmHg.  6. A small pericardial effusion is present. FINDINGS  Left Ventricle: Left ventricular ejection fraction, by estimation, is 65 to 70%. The left ventricle has normal function. The left ventricle has no regional wall motion abnormalities. The left ventricular internal cavity size was normal in size. There is  moderate left ventricular hypertrophy. Left ventricular diastolic parameters are consistent with Grade I diastolic dysfunction (impaired relaxation). Right Ventricle: The right ventricular size is normal. Mildly increased right ventricular wall thickness. Right ventricular systolic function is normal. Tricuspid regurgitation signal is inadequate for assessing PA pressure. Left Atrium: Left atrial size was normal in size. Right Atrium: Right atrial size was normal in size. Pericardium: A small pericardial effusion is present. Mitral Valve: The mitral valve is normal in structure. Trivial mitral valve regurgitation. No evidence of mitral valve stenosis. Tricuspid Valve: The tricuspid valve is normal in structure. Tricuspid valve regurgitation is not demonstrated. Aortic Valve: The aortic  valve was not well visualized. Aortic valve regurgitation is trivial. Aortic regurgitation PHT measures 520 msec. No aortic stenosis is present. Pulmonic Valve: The pulmonic valve was normal in structure. Pulmonic valve regurgitation is not visualized. Aorta: The aortic root is normal in size and structure. Venous: The inferior vena cava is normal in size with greater than 50% respiratory variability, suggesting right atrial pressure of 3 mmHg. IAS/Shunts: No atrial level shunt detected by color flow Doppler.  LEFT VENTRICLE PLAX 2D LVIDd:         2.50 cm  Diastology LVIDs:         1.20 cm  LV e' medial:    3.59 cm/s LV PW:         1.70 cm  LV E/e' medial:  15.8 LV IVS:        1.70 cm  LV e' lateral:   3.81 cm/s LVOT diam:     1.80 cm  LV E/e' lateral: 14.9 LV SV:         48 LV SV Index:   31 LVOT Area:     2.54 cm  RIGHT VENTRICLE RV S prime:     18.30 cm/s TAPSE (M-mode): 1.4 cm LEFT ATRIUM             Index      RIGHT ATRIUM  Index LA diam:        3.30 cm 2.16 cm/m RA Area:     3.33 cm LA Vol (A2C):   11.4 ml 7.46 ml/m RA Volume:   3.61 ml  2.36 ml/m LA Vol (A4C):   8.6 ml  5.62 ml/m LA Biplane Vol: 10.1 ml 6.61 ml/m  AORTIC VALVE LVOT Vmax:   96.70 cm/s LVOT Vmean:  70.700 cm/s LVOT VTI:    0.188 m AI PHT:      520 msec  AORTA Ao Root diam: 3.30 cm Ao Asc diam:  2.60 cm MITRAL VALVE MV Area (PHT): 2.56 cm    SHUNTS MV Decel Time: 296 msec    Systemic VTI:  0.19 m MV E velocity: 56.60 cm/s  Systemic Diam: 1.80 cm MV A velocity: 72.00 cm/s MV E/A ratio:  0.79 Loralie Champagne MD Electronically signed by Loralie Champagne MD Signature Date/Time: 12/08/2020/5:15:53 PM    Final    [place] EKG SITE RITE  Result Date: 01/01/2021 If Site Rite image not attached, placement could not be confirmed due to current cardiac rhythm.    Time Spent in minutes  30     Desiree Hane M.D on 01/04/2021 at 6:24 PM  To page go to [email or web address] - password [facility]

## 2021-01-05 LAB — GLUCOSE, CAPILLARY
Glucose-Capillary: 120 mg/dL — ABNORMAL HIGH (ref 70–99)
Glucose-Capillary: 140 mg/dL — ABNORMAL HIGH (ref 70–99)
Glucose-Capillary: 147 mg/dL — ABNORMAL HIGH (ref 70–99)
Glucose-Capillary: 148 mg/dL — ABNORMAL HIGH (ref 70–99)
Glucose-Capillary: 167 mg/dL — ABNORMAL HIGH (ref 70–99)
Glucose-Capillary: 172 mg/dL — ABNORMAL HIGH (ref 70–99)
Glucose-Capillary: 265 mg/dL — ABNORMAL HIGH (ref 70–99)
Glucose-Capillary: 281 mg/dL — ABNORMAL HIGH (ref 70–99)

## 2021-01-05 NOTE — NC FL2 (Signed)
[place] LEVEL OF CARE SCREENING TOOL     IDENTIFICATION  Patient Name: [name] Birthdate: [birth year] Sex: female Admission Date (Current Location): 12/07/2020  [facility] and [place] Number:  Herbalist and Address:  [facility],  [place] [address], [place]      Provider Number: [id number]  Attending Physician Name and Address:  Desiree Hane, MD  Relative Name and Phone Number:  [name] spouse [phone]    Current Level of Care: Hospital Recommended Level of Care: [place] Prior Approval Number:    Date Approved/Denied:   PASRR Number: [id number]  Discharge Plan: SNF    Current Diagnoses: Patient Active Problem List   Diagnosis Date Noted  . Acute osteomyelitis of sacrum ([place])   . Failure to thrive in adult   . Unspecified severe protein-calorie malnutrition ([place]) 12/10/2020  . Acute metabolic encephalopathy 40/07/6760  . Sacral decubitus ulcer, stage IV ([place]) 12/08/2020  . Cellulitis 12/08/2020  . Hypotension 12/08/2020  . Malodorous urine 12/08/2020  . Erosive esophagitis 12/08/2020  . Hypothyroid 12/08/2020  . Depression 12/08/2020  . Type 1 diabetes mellitus with hyperlipidemia ([place]) 12/08/2020  . Sepsis ([place]) 12/07/2020  . Hypoalbuminemia 12/07/2020  . Hyponatremia 12/07/2020  . CAP (community acquired pneumonia) 12/07/2020  . Acute urinary retention 12/07/2020  . Coffee ground emesis   . Gastroesophageal reflux disease with esophagitis and hemorrhage   . Long-term use of immunosuppressant medication   . GI bleed 11/08/2020  . AKI (acute kidney injury) ([place]) 10/29/2020  . Gangrene of right foot ([place])   . Subacute osteomyelitis of right foot ([place])   . Type 1 diabetes mellitus with complication, with long term current use of insulin pump ([place]) 09/27/2020  . Pressure ulcer of BKA stump ([place]) 09/27/2020  . Diabetic ketoacidosis ([place]) 09/26/2020  . Acute kidney injury superimposed  on CKD ([place]) 09/26/2020  . Cellulitis and abscess of right leg 09/26/2020  . 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 S/p cadaver renal transplant 05/14/2020  . Chronic kidney disease (CKD) stage G3a/A1, moderately decreased glomerular filtration rate (GFR) between 45-59 mL/min/1.73 square meter and albuminuria creatinine ratio less than 30 mg/g (HCC) 05/14/2020  . Hypertension 05/14/2020    Orientation RESPIRATION BLADDER Height & Weight     Self,Time  Normal Incontinent Weight: 77 kg Height:  5\' 3"  (160 cm)  BEHAVIORAL SYMPTOMS/MOOD NEUROLOGICAL BOWEL NUTRITION STATUS      Incontinent Diet  AMBULATORY STATUS COMMUNICATION OF NEEDS Skin   Limited Assist Verbally PU Stage and Appropriate Care,Hydro Therapy,Other (Comment) (saline, pat dry,aquacell sacrum qd,q tip swab tuck underneath would edges, & fill space below skin.[place] outer red wound edges,cover w/foam,lift foam to re pack qd,change foam dsgs Q3days or PRN soiling.IV abx-rocephine/ceftriaxone 2gm Q24hr 02/12/21)                       Personal Care Assistance Level of Assistance  Bathing,Feeding,Dressing Bathing Assistance: Limited assistance Feeding assistance: Limited assistance Dressing Assistance: Limited assistance     Functional Limitations Info  Sight,Hearing,Speech   Hearing Info: Adequate Speech Info: Adequate    SPECIAL CARE FACTORS FREQUENCY  PT (By licensed PT),OT (By licensed OT)     PT Frequency: 5x week OT Frequency: 5x week            Contractures Contractures Info: Not present    Additional Factors Info  Code Status,Allergies,Insulin Sliding Scale Code Status Info:  (Full code) Allergies Info:  (  NKA)   Insulin Sliding Scale Info:  (SSI see MAR)       Current Medications (01/05/2021):  This is the current hospital active medication list Current Facility-Administered Medications  Medication Dose Route Frequency Provider Last Rate Last Admin  . 0.9 %  sodium chloride infusion    Intravenous PRN Pokhrel, Laxman, MD 10 mL/hr at 01/04/21 2126 250 mL at 01/04/21 2126  . acetaminophen (TYLENOL) tablet 650 mg  650 mg Oral Q6H PRN Toy Baker, MD   650 mg at 12/25/20 2136   Or  . acetaminophen (TYLENOL) suppository 650 mg  650 mg Rectal Q6H PRN Toy Baker, MD      . azaTHIOprine (IMURAN) tablet 50 mg  50 mg Oral Daily Roney Jaffe, MD   50 mg at 01/05/21 0933  . cefTRIAXone (ROCEPHIN) 2 g in sodium chloride 0.9 % 100 mL IVPB  2 g Intravenous Q24H Vu, Trung T, MD 200 mL/hr at 01/04/21 1720 2 g at 01/04/21 1720  . Chlorhexidine Gluconate Cloth 2 % PADS 6 each  6 each Topical Daily Desiree Hane, MD   6 each at 01/05/21 0919  . cycloSPORINE (SANDIMMUNE) capsule 75 mg  75 mg Oral BID Roney Jaffe, MD   75 mg at 01/05/21 0933  . doxycycline (VIBRA-TABS) tablet 100 mg  100 mg Oral Q12H Vu, Trung T, MD   100 mg at 01/05/21 0933  . feeding supplement (ENSURE ENLIVE / ENSURE PLUS) liquid 237 mL  237 mL Oral BID BM Oretha Milch D, MD   237 mL at 01/05/21 0934  . folic acid (FOLVITE) tablet 1 mg  1 mg Oral Daily Pokhrel, Laxman, MD   1 mg at 01/05/21 0933  . HYDROcodone-acetaminophen (NORCO/VICODIN) 5-325 MG per tablet 1-2 tablet  1-2 tablet Oral Q4H PRN Toy Baker, MD   2 tablet at 01/05/21 0942  . hydrocortisone cream 1 %   Topical BID PRN Flora Lipps, MD   Given at 01/01/21 0830  . insulin aspart (novoLOG) injection 0-6 Units  0-6 Units Subcutaneous Q4H Oretha Milch D, MD   1 Units at 01/05/21 1202  . insulin glargine (LANTUS) injection 4 Units  4 Units Subcutaneous Daily Oretha Milch D, MD   4 Units at 01/05/21 0933  . levothyroxine (SYNTHROID) tablet 50 mcg  50 mcg Oral Q0600 Minda Ditto, RPH   50 mcg at 01/05/21 0604  . liver oil-zinc oxide (DESITIN) 40 % ointment   Topical Daily Hosie Poisson, MD   Given at 01/05/21 0935  . MEDLINE mouth rinse  15 mL Mouth Rinse BID Oretha Milch D, MD   15 mL at 01/05/21 0935  . metoprolol tartrate  (LOPRESSOR) tablet 12.5 mg  12.5 mg Oral BID Pokhrel, Laxman, MD   12.5 mg at 01/05/21 0933  . multivitamin with minerals tablet 1 tablet  1 tablet Oral Daily Oretha Milch D, MD   1 tablet at 01/05/21 0604  . nutrition supplement (JUVEN) (JUVEN) powder packet 1 packet  1 packet Oral BID BM Desiree Hane, MD   1 packet at 01/05/21 0933  . nystatin (MYCOSTATIN/NYSTOP) topical powder   Topical TID Toy Baker, MD   Given at 01/05/21 0934  . ondansetron (ZOFRAN) injection 4 mg  4 mg Intravenous Q6H PRN Pokhrel, Laxman, MD      . pantoprazole (PROTONIX) EC tablet 40 mg  40 mg Oral BID Minda Ditto, RPH   40 mg at 01/05/21 0933  . polyethylene glycol (MIRALAX / GLYCOLAX) packet 17  g  17 g Oral Daily Hosie Poisson, MD   17 g at 12/30/20 1055  . polyvinyl alcohol (LIQUIFILM TEARS) 1.4 % ophthalmic solution 1 drop  1 drop Both Eyes PRN Pokhrel, Laxman, MD      . rosuvastatin (CRESTOR) tablet 5 mg  5 mg Oral QHS Doutova, Anastassia, MD   5 mg at 01/04/21 2118  . senna-docusate (Senokot-S) tablet 2 tablet  2 tablet Oral BID Hosie Poisson, MD   2 tablet at 01/02/21 2219  . sertraline (ZOLOFT) tablet 25 mg  25 mg Oral Daily Doutova, Anastassia, MD   25 mg at 01/05/21 0933  . sodium bicarbonate tablet 650 mg  650 mg Oral BID Toy Baker, MD   650 mg at 01/05/21 0933  . sodium chloride flush (NS) 0.9 % injection 10-40 mL  10-40 mL Intracatheter PRN Oretha Milch D, MD      . vitamin B-12 (CYANOCOBALAMIN) tablet 100 mcg  100 mcg Oral Daily Pokhrel, Laxman, MD   100 mcg at 01/05/21 9675     Discharge Medications: Please see discharge summary for a list of discharge medications.  Relevant Imaging Results:  Relevant Lab Results:   Additional Information  (PICC.)  Leisa Gault, Juliann Pulse, RN

## 2021-01-05 NOTE — TOC Progression Note (Signed)
Transition of Care [facility]) - Progression Note    Patient Details  Name: [name] MRN: [id number] Date of Birth: November 12, 1961  Transition of Care [facility]) CM/SW Contact  Nnaemeka Samson, Juliann Pulse, RN Phone Number: 01/05/2021, 9:08 AM  Clinical Narrative:   1. 5.7 mi [facility] [address] [place], [place] [address] [phone] Overall rating Below average 2. 5.8 mi [place], [place] [address] [phone] Overall rating Much above average    Expected Discharge Plan: [place] Barriers to Discharge: Continued Medical Work up  Expected Discharge Plan and Services Expected Discharge Plan: [place]   Discharge Planning Services: CM Consult Post Acute Care Choice: [place] Living arrangements for the past 2 months: Single Family Home                               Date [place]: 12/30/20 Time [place] Contacted: 2336     Social Determinants of Health (SDOH) Interventions    Readmission Risk Interventions No flowsheet data found.

## 2021-01-05 NOTE — NC FL2 (Signed)
[place] LEVEL OF CARE SCREENING TOOL     IDENTIFICATION  Patient Name: [name] Birthdate: 1961/09/11 Sex: female Admission Date (Current Location): 12/07/2020  [facility] and [place] Number:  Herbalist and Address:  [facility],  [place] [address] [place]      Provider Number: [id number]  Attending Physician Name and Address:  Desiree Hane, MD  Relative Name and Phone Number:  [name] spouse [phone]    Current Level of Care: Hospital Recommended Level of Care: [place] Prior Approval Number:    Date Approved/Denied:   PASRR Number: [id number]  Discharge Plan: SNF    Current Diagnoses: Patient Active Problem List   Diagnosis Date Noted  . Acute osteomyelitis of sacrum ([place])   . Failure to thrive in adult   . Unspecified severe protein-calorie malnutrition ([place]) 12/10/2020  . Acute metabolic encephalopathy 99/37/1696  . Sacral decubitus ulcer, stage IV ([place]) 12/08/2020  . Cellulitis 12/08/2020  . Hypotension 12/08/2020  . Malodorous urine 12/08/2020  . Erosive esophagitis 12/08/2020  . Hypothyroid 12/08/2020  . Depression 12/08/2020  . Type 1 diabetes mellitus with hyperlipidemia ([place]) 12/08/2020  . Sepsis ([place]) 12/07/2020  . Hypoalbuminemia 12/07/2020  . Hyponatremia 12/07/2020  . CAP (community acquired pneumonia) 12/07/2020  . Acute urinary retention 12/07/2020  . Coffee ground emesis   . Gastroesophageal reflux disease with esophagitis and hemorrhage   . Long-term use of immunosuppressant medication   . GI bleed 11/08/2020  . AKI (acute kidney injury) ([place]) 10/29/2020  . Gangrene of right foot ([place])   . Subacute osteomyelitis of right foot ([place])   . Type 1 diabetes mellitus with complication, with long term current use of insulin pump ([place]) 09/27/2020  . Pressure ulcer of BKA stump ([place]) 09/27/2020  . Diabetic ketoacidosis ([place]) 09/26/2020  . Acute kidney injury superimposed  on CKD ([place]) 09/26/2020  . Cellulitis and abscess of right leg 09/26/2020  . 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 S/p cadaver renal transplant 05/14/2020  . Chronic kidney disease (CKD) stage G3a/A1, moderately decreased glomerular filtration rate (GFR) between 45-59 mL/min/1.73 square meter and albuminuria creatinine ratio less than 30 mg/g (HCC) 05/14/2020  . Hypertension 05/14/2020    Orientation RESPIRATION BLADDER Height & Weight     Self,Time  Normal Incontinent Weight: 77 kg Height:  5\' 3"  (160 cm)  BEHAVIORAL SYMPTOMS/MOOD NEUROLOGICAL BOWEL NUTRITION STATUS      Incontinent Diet  AMBULATORY STATUS COMMUNICATION OF NEEDS Skin   Limited Assist Verbally PU Stage and Appropriate Care,Hydro Therapy,Other (Comment) (saline, pat dry,aquacell sacrum qd,q tip swab tuck underneath would edges, & fill space below skin.[place] outer red wound edges,cover w/foam,lift foam to re pack qd,change foam dsgs Q3days or PRN soiling.IV abx-rocephine/ceftriaxone 2gm Q24hr 02/12/21)                       Personal Care Assistance Level of Assistance  Bathing,Feeding,Dressing Bathing Assistance: Limited assistance Feeding assistance: Limited assistance Dressing Assistance: Limited assistance     Functional Limitations Info  Sight,Hearing,Speech   Hearing Info: Adequate Speech Info: Adequate    SPECIAL CARE FACTORS FREQUENCY  PT (By licensed PT),OT (By licensed OT)     PT Frequency: 5x week OT Frequency: 5x week            Contractures Contractures Info: Not present    Additional Factors Info  Code Status,Allergies,Insulin Sliding Scale Code Status Info:  (Full code) Allergies Info:  (  NKA)   Insulin Sliding Scale Info:  (SSI see MAR)       Current Medications (01/05/2021):  This is the current hospital active medication list Current Facility-Administered Medications  Medication Dose Route Frequency Provider Last Rate Last Admin  . 0.9 %  sodium chloride infusion    Intravenous PRN Pokhrel, Laxman, MD 10 mL/hr at 01/04/21 2126 250 mL at 01/04/21 2126  . acetaminophen (TYLENOL) tablet 650 mg  650 mg Oral Q6H PRN Toy Baker, MD   650 mg at 12/25/20 2136   Or  . acetaminophen (TYLENOL) suppository 650 mg  650 mg Rectal Q6H PRN Toy Baker, MD      . azaTHIOprine (IMURAN) tablet 50 mg  50 mg Oral Daily Roney Jaffe, MD   50 mg at 01/05/21 0933  . cefTRIAXone (ROCEPHIN) 2 g in sodium chloride 0.9 % 100 mL IVPB  2 g Intravenous Q24H Vu, Trung T, MD 200 mL/hr at 01/04/21 1720 2 g at 01/04/21 1720  . Chlorhexidine Gluconate Cloth 2 % PADS 6 each  6 each Topical Daily Desiree Hane, MD   6 each at 01/05/21 0919  . cycloSPORINE (SANDIMMUNE) capsule 75 mg  75 mg Oral BID Roney Jaffe, MD   75 mg at 01/05/21 0933  . doxycycline (VIBRA-TABS) tablet 100 mg  100 mg Oral Q12H Vu, Trung T, MD   100 mg at 01/05/21 0933  . feeding supplement (ENSURE ENLIVE / ENSURE PLUS) liquid 237 mL  237 mL Oral BID BM Oretha Milch D, MD   237 mL at 01/05/21 0934  . folic acid (FOLVITE) tablet 1 mg  1 mg Oral Daily Pokhrel, Laxman, MD   1 mg at 01/05/21 0933  . HYDROcodone-acetaminophen (NORCO/VICODIN) 5-325 MG per tablet 1-2 tablet  1-2 tablet Oral Q4H PRN Toy Baker, MD   2 tablet at 01/05/21 0942  . hydrocortisone cream 1 %   Topical BID PRN Flora Lipps, MD   Given at 01/01/21 0830  . insulin aspart (novoLOG) injection 0-6 Units  0-6 Units Subcutaneous Q4H Oretha Milch D, MD   1 Units at 01/05/21 1202  . insulin glargine (LANTUS) injection 4 Units  4 Units Subcutaneous Daily Oretha Milch D, MD   4 Units at 01/05/21 0933  . levothyroxine (SYNTHROID) tablet 50 mcg  50 mcg Oral Q0600 Minda Ditto, RPH   50 mcg at 01/05/21 0604  . liver oil-zinc oxide (DESITIN) 40 % ointment   Topical Daily Hosie Poisson, MD   Given at 01/05/21 0935  . MEDLINE mouth rinse  15 mL Mouth Rinse BID Oretha Milch D, MD   15 mL at 01/05/21 0935  . metoprolol tartrate  (LOPRESSOR) tablet 12.5 mg  12.5 mg Oral BID Pokhrel, Laxman, MD   12.5 mg at 01/05/21 0933  . multivitamin with minerals tablet 1 tablet  1 tablet Oral Daily Oretha Milch D, MD   1 tablet at 01/05/21 0604  . nutrition supplement (JUVEN) (JUVEN) powder packet 1 packet  1 packet Oral BID BM Desiree Hane, MD   1 packet at 01/05/21 0933  . nystatin (MYCOSTATIN/NYSTOP) topical powder   Topical TID Toy Baker, MD   Given at 01/05/21 0934  . ondansetron (ZOFRAN) injection 4 mg  4 mg Intravenous Q6H PRN Pokhrel, Laxman, MD      . pantoprazole (PROTONIX) EC tablet 40 mg  40 mg Oral BID Minda Ditto, RPH   40 mg at 01/05/21 0933  . polyethylene glycol (MIRALAX / GLYCOLAX) packet 17  g  17 g Oral Daily Hosie Poisson, MD   17 g at 12/30/20 1055  . polyvinyl alcohol (LIQUIFILM TEARS) 1.4 % ophthalmic solution 1 drop  1 drop Both Eyes PRN Pokhrel, Laxman, MD      . rosuvastatin (CRESTOR) tablet 5 mg  5 mg Oral QHS Doutova, Anastassia, MD   5 mg at 01/04/21 2118  . senna-docusate (Senokot-S) tablet 2 tablet  2 tablet Oral BID Hosie Poisson, MD   2 tablet at 01/02/21 2219  . sertraline (ZOLOFT) tablet 25 mg  25 mg Oral Daily Doutova, Anastassia, MD   25 mg at 01/05/21 0933  . sodium bicarbonate tablet 650 mg  650 mg Oral BID Toy Baker, MD   650 mg at 01/05/21 0933  . sodium chloride flush (NS) 0.9 % injection 10-40 mL  10-40 mL Intracatheter PRN Oretha Milch D, MD      . vitamin B-12 (CYANOCOBALAMIN) tablet 100 mcg  100 mcg Oral Daily Pokhrel, Laxman, MD   100 mcg at 01/05/21 7737     Discharge Medications: Please see discharge summary for a list of discharge medications.  Relevant Imaging Results:  Relevant Lab Results:   Additional Information VG#[id number]  Arie Gable, Juliann Pulse, RN

## 2021-01-05 NOTE — TOC Progression Note (Signed)
Transition of Care [facility]) - Progression Note    Patient Details  Name: [name] MRN: [id number] Date of Birth: October 30, 1961  Transition of Care [facility]) CM/SW Contact  Zaidyn Claire, Juliann Pulse, RN Phone Number: 01/05/2021, 2:14 PM  Clinical Narrative:Updated fl2 to include-wound care;iv abx,picc line. [place] following if able to provide services.       Expected Discharge Plan: Skilled Nursing Facility Barriers to Discharge: Continued Medical Work up  Expected Discharge Plan and Services Expected Discharge Plan: [place]   Discharge Planning Services: CM Consult Post Acute Care Choice: [place] Living arrangements for the past 2 months: Single Family Home                               Date [place]: 12/30/20 Time [place]: [id number]     Social Determinants of Health (SDOH) Interventions    Readmission Risk Interventions No flowsheet data found.

## 2021-01-05 NOTE — Progress Notes (Signed)
[facility]  PROGRESS NOTE  English Tomer ZOX:[id number] DOB: 04-Feb-1961 DOA: 12/07/2020 PCP: [name], MD Admit date - 12/07/2020   Admitting Physician Toy Baker, MD  Outpatient Primary MD for the patient is [name], MD  LOS - 29 Brief Narrative   [name] is a 60 y.o. 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  Hospital course patient initially started on empiric vancomycin, cefepime, Flagyl.  Due to AKI in setting of renal transplant we will switch to daptomycin instead of vancomycin and completed 7 days of therapy of Flagyl and daptomycin (12/13-12/19).  She was then started on Zosyn (12/30-1/3) for presumed aspiration pneumonia (no fever, hypotension, chest x-ray with infiltrate).  She has had prolonged hospitalization to above as well as necessary hydrotherapy for sacral decubitus ulcer.  Subjective  Back pain stable. Husband at bedside A & P    Unstageable chronic decubitus ulcer, present on admission, with acute osteomyelitis of sacrum.  Completed 7-day course of vancomycin/daptomycin/Flagyl (12/13-12/19).  Remains afebrile, hemodynamically stable, previous CT showed no osteomyelitis, difficult to see any exposed bone on my exam but wound care note on 12/30 reports visualized exposed bone (no mention in prior note). MRI ( 1/6//21) confirms acute osteomyelitis Risk factor likely immobilization/bed bound status.  Unable to get bone biopsy as not offered by  general surgery -Appreciate ID recommendations, IV ceftriaxone 2 g every 24 and doxycycline 100 mg p.o. twice daily, End date 02/12/2021 - PICC placed 01/02/2021, discussed with on-call nephrologist, Dr. Carolin Sicks and 1/6, who states given stability in creatinine and need for antibiotic therapy okay with line placement -Continue wound dressing care as recommended by wound care nursing  Aspiration pneumonia, resolved.  On room air.  Completed 5-day course of IV Zosyn.  Remains afebrile and hemodynamically stable.  AKI on CKD stage III, status post renal transplant creatinine stable at baseline.  Having normal urine output -Monitor urine output, avoid nephrotoxins, monitor BMP -Continue sodium bicarb -Continue Imuran and cyclosporine  Type 1 diabetes, status post pancreas transplant, poorly controlled recurrent hypoglycemia.  A1c 7.2.  Last hypoglycemic episode on 1/6 with glucose of 34 back in the setting of n.p.o. status for possible procedure at that time.  Was monitored off of long-acting insulin and now fasting CBGs are at goal 150s-160s.  Patient is eating well. - Continue Lantus at reduced dose of 4 units daily,  -Reduced sliding scale to 0 to 6 units every 4 hours allow for better nighttime coverage -Appreciate diabetes coordinator assistance  Chronic macrocytic anemia, stable.  History of B12 deficiency hemoglobin stable at baseline. -Monitor CBC, continue W11 and folic acid supplementation  HTN, stable -Continue Lopressor  Hypothyroidism, stable  -continue Synthroid  Depression, stable -continue Zoloft  Erosive esophagitis, stable -continue PPI     Family Communication  : Husband updated at bedside on 1/7  Code Status : Full  Disposition Plan  :  Patient is from SNF. Anticipated d/c date:  Medically stable for discharge. Barriers to d/c or necessity for inpatient status:  IV antibiotics via PICC,  medically stable for discharge to new SNF, patient agreeable , TOC  assisting Consults  : Infectious disease, nephrology  Procedures  :   TTE, 12/13 1. Left ventricular ejection fraction, by estimation, is 65 to 70%. The  left ventricle has normal function. The left ventricle has no regional  wall motion abnormalities. There is moderate left ventricular hypertrophy.  Left ventricular diastolic  parameters are consistent with Grade I diastolic dysfunction (impaired  relaxation).  2. Right ventricular systolic function is normal. The right ventricular  size is normal. Mildly increased right ventricular wall thickness.  Tricuspid regurgitation signal is inadequate for assessing PA pressure.  3. The mitral valve is normal in structure. Trivial mitral valve  regurgitation. No evidence of mitral stenosis.  4. The aortic valve was not well visualized. Aortic valve regurgitation  is trivial. No aortic stenosis is present.  5. The inferior vena cava is normal in size with greater than 50%  respiratory variability, suggesting right atrial pressure of 3 mmHg.  6. A small pericardial effusion is present.   DVT Prophylaxis  : SCDs  MDM: The below labs and imaging reports were reviewed and summarized above.  Medication management as above.  Lab Results  Component Value Date   PLT 321 01/03/2021    Diet :  Diet Order            Diet Carb Modified Fluid consistency: Thin; Room service appropriate? Yes  Diet effective now           Diet - low sodium heart healthy                  Inpatient Medications Scheduled Meds: . azaTHIOprine  50 mg Oral Daily  . Chlorhexidine Gluconate Cloth  6 each Topical Daily  . cycloSPORINE  75 mg Oral BID  . doxycycline  100 mg Oral Q12H  . feeding supplement  237 mL Oral BID BM  . folic acid  1 mg Oral Daily  . insulin aspart  0-6 Units Subcutaneous Q4H  . insulin glargine  4 Units Subcutaneous Daily  . levothyroxine  50 mcg Oral Q0600  . liver oil-zinc oxide   Topical Daily  . mouth rinse  15 mL Mouth  Rinse BID  . metoprolol tartrate  12.5 mg Oral BID  . multivitamin with minerals  1 tablet Oral Daily  . nutrition supplement (JUVEN)  1 packet Oral BID BM  . nystatin   Topical TID  . pantoprazole  40 mg Oral BID  . polyethylene glycol  17 g Oral Daily  . rosuvastatin  5 mg Oral QHS  . senna-docusate  2 tablet Oral BID  . sertraline  25 mg Oral Daily  . sodium bicarbonate  650 mg Oral BID  . vitamin B-12  100 mcg Oral Daily   Continuous Infusions: . sodium chloride Stopped (01/05/21 1636)  . cefTRIAXone (ROCEPHIN)  IV 200 mL/hr at 01/05/21 1652   PRN Meds:.sodium chloride, acetaminophen **OR** acetaminophen, HYDROcodone-acetaminophen, hydrocortisone cream, ondansetron (ZOFRAN) IV, polyvinyl alcohol, sodium chloride flush  Antibiotics  :   Anti-infectives (From admission, onward)   Start     Dose/Rate Route Frequency Ordered Stop   01/01/21 2200  doxycycline (VIBRA-TABS) tablet 100 mg        100 mg Oral Every 12 hours 01/01/21 1626     01/01/21 1715  cefTRIAXone (ROCEPHIN) 2 g in sodium chloride 0.9 % 100 mL IVPB        2 g 200 mL/hr over 30 Minutes  Intravenous Every 24 hours 01/01/21 1626     12/25/20 1530  piperacillin-tazobactam (ZOSYN) IVPB 3.375 g        3.375 g 12.5 mL/hr over 240 Minutes Intravenous Every 8 hours 12/25/20 1421 12/30/20 1001   12/11/20 1500  ceFEPIme (MAXIPIME) 2 g in sodium chloride 0.9 % 100 mL IVPB        2 g 200 mL/hr over 30 Minutes Intravenous Every 12 hours 12/11/20 1358 12/14/20 1730   12/10/20 2000  DAPTOmycin (CUBICIN) 300 mg in sodium chloride 0.9 % IVPB        300 mg 212 mL/hr over 30 Minutes Intravenous Daily 12/10/20 0946 12/14/20 2250   12/08/20 2100  vancomycin (VANCOREADY) IVPB 500 mg/100 mL  Status:  Discontinued        500 mg 100 mL/hr over 60 Minutes Intravenous Every 24 hours 12/08/20 0040 12/10/20 0946   12/08/20 2000  ceFEPIme (MAXIPIME) 2 g in sodium chloride 0.9 % 100 mL IVPB  Status:  Discontinued        2 g 200 mL/hr over 30  Minutes Intravenous Every 24 hours 12/08/20 0036 12/11/20 1358   12/08/20 1400  metroNIDAZOLE (FLAGYL) tablet 500 mg        500 mg Oral Every 8 hours 12/08/20 1049 12/14/20 2210   12/08/20 0600  metroNIDAZOLE (FLAGYL) IVPB 500 mg  Status:  Discontinued        500 mg 100 mL/hr over 60 Minutes Intravenous Every 8 hours 12/07/20 2248 12/08/20 1049   12/07/20 2030  ceFEPIme (MAXIPIME) 2 g in sodium chloride 0.9 % 100 mL IVPB        2 g 200 mL/hr over 30 Minutes Intravenous  Once 12/07/20 2018 12/07/20 2105   12/07/20 2030  metroNIDAZOLE (FLAGYL) IVPB 500 mg        500 mg 100 mL/hr over 60 Minutes Intravenous  Once 12/07/20 2018 12/07/20 2321   12/07/20 2030  vancomycin (VANCOCIN) IVPB 1000 mg/200 mL premix        1,000 mg 200 mL/hr over 60 Minutes Intravenous  Once 12/07/20 2018 12/07/20 2212       Objective   Vitals:   01/05/21 0413 01/05/21 0419 01/05/21 1156 01/05/21 2111  BP: 133/70  (!) 116/58 (!) 118/57  Pulse: 81  69 88  Resp: 16  17 18   Temp: 98.7 F (37.1 C)  98 F (36.7 C) 98.7 F (37.1 C)  TempSrc:      SpO2: 98%  97% 96%  Weight:  77 kg    Height:        SpO2: 96 %  Wt Readings from Last 3 Encounters:  01/05/21 77 kg  11/12/20 58.8 kg  11/02/20 64.4 kg     Intake/Output Summary (Last 24 hours) at 01/05/2021 2154 Last data filed at 01/05/2021 1900 Gross per 24 hour  Intake 1085.09 ml  Output 1350 ml  Net -264.91 ml    Physical Exam:     Awake Alert, Oriented X 3, Normal affect No new F.N deficits,  [place].AT, Normal respiratory effort on room air, CTAB RRR,No Gallops,Rubs or new Murmurs,  +ve B.Sounds, Abd Soft, No tenderness, No rebound, guarding or rigidity. No Cyanosis, No new Rash or bruise  01/04/21           I have personally reviewed the following:   Data Reviewed:  CBC Recent Labs  Lab 12/31/20 0830 01/01/21 0609 01/03/21 1600  WBC 5.0 4.6 4.9  HGB 8.2* 8.1* 8.4*  HCT 26.2* 26.5*  26.5*  PLT 311 315 321  MCV 112.4* 113.2*  112.3*  MCH 35.2* 34.6* 35.6*  MCHC 31.3 30.6 31.7  RDW 25.0* 24.4* 23.7*    Chemistries  Recent Labs  Lab 12/31/20 0830 01/01/21 0609  NA 138 135  K 4.7 5.1  CL 101 100  CO2 27 27  GLUCOSE 140* 177*  BUN 59* 67*  CREATININE 1.05* 0.97  CALCIUM 9.5 9.4  MG 2.0  --   AST  --  15  ALT  --  12  ALKPHOS  --  103  BILITOT  --  0.5   ------------------------------------------------------------------------------------------------------------------ No results for input(s): CHOL, HDL, LDLCALC, TRIG, CHOLHDL, LDLDIRECT in the last 72 hours.  Lab Results  Component Value Date   HGBA1C 8.6 (H) 12/08/2020   ------------------------------------------------------------------------------------------------------------------ No results for input(s): TSH, T4TOTAL, T3FREE, THYROIDAB in the last 72 hours.  Invalid input(s): FREET3 ------------------------------------------------------------------------------------------------------------------ No results for input(s): VITAMINB12, FOLATE, FERRITIN, TIBC, IRON, RETICCTPCT in the last 72 hours.  Coagulation profile No results for input(s): INR, PROTIME in the last 168 hours.  No results for input(s): DDIMER in the last 72 hours.  Cardiac Enzymes No results for input(s): CKMB, TROPONINI, MYOGLOBIN in the last 168 hours.  Invalid input(s): CK ------------------------------------------------------------------------------------------------------------------ No results found for: BNP  Micro Results No results found for this or any previous visit (from the past 240 hour(s)).  Radiology Reports DG Tibia/Fibula Right  Result Date: 12/31/2020 CLINICAL DATA:  Amputation stump pain, bleeding, per [facility] wound EXAM: RIGHT TIBIA AND FIBULA - 2 VIEW COMPARISON:  None. FINDINGS: Status post below the knee amputation of the right tibia and fibula. No bony erosion or sclerosis. Soft tissue wound of the stump tip. Vascular calcinosis. The included knee  joint is well preserved. IMPRESSION: Status post below the knee amputation of the right tibia and fibula. No bony erosion or sclerosis to suggest osteomyelitis. Soft tissue wound of the stump tip. MRI is the test of choice for the evaluation of bone marrow edema and osteomyelitis if clinically suspected. Electronically Signed   By: Eddie Candle M.D.   On: 12/31/2020 16:41   CT PELVIS WO CONTRAST  Result Date: 12/07/2020 CLINICAL DATA:  Sacral ulcer, concern for osteomyelitis EXAM: CT PELVIS WITHOUT CONTRAST TECHNIQUE: Multidetector CT imaging of the pelvis was performed following the standard protocol without intravenous contrast. COMPARISON:  None. FINDINGS: Urinary Tract: Urinary bladder is markedly distended to the level of the umbilicus. There appears to be a renal transplant kidney in the cul-de-sac of the pelvis. Mild caliectasis. Bowel:  Unremarkable visualized pelvic bowel loops. Vascular/Lymphatic: Heavily calcified aorta and iliac vessels. No adenopathy. Reproductive:  No visible pelvic mass. Other:  No free fluid or free air. Musculoskeletal: Sacral decubital ulcer noted. No bone destruction in the sacrum or coccyx to suggest osteomyelitis. Prior right hip replacement. IMPRESSION: Sacral decubitus ulcer.  No evidence of osteomyelitis. Apparent renal transplant within the cul-de-sac of the pelvis. Mild caliectasis noted within the renal transplant. Urinary bladder is markedly distended to the level of the umbilicus. Aortoiliac atherosclerosis. Electronically Signed   By: Rolm Baptise M.D.   On: 12/07/2020 21:51   MR PELVIS W WO CONTRAST  Result Date: 01/01/2021 CLINICAL DATA:  Chronic sacral decubitus ulcer EXAM: MRI PELVIS WITHOUT AND WITH CONTRAST TECHNIQUE: Multiplanar multisequence MR imaging of the pelvis was performed both before and after administration of intravenous contrast. CONTRAST:  82mL GADAVIST GADOBUTROL 1 MMOL/ML IV SOLN COMPARISON:  CT 12/07/2020 FINDINGS: Sacral decubitus ulcer  overlies the mid  to distal aspect of the sacrum. Thin fluid and air collection contiguous with the ulcer base extend into the dorsal cortex of the sacrum. Bone marrow edema and confluent low T1 signal is present within the distal sacrum extending from S4 to the sacrococcygeal junction (series 8, images 20-24). Bone marrow edema extends from the S3 segment through the distal coccyx (series 4, images 19-21). Bilateral SI joints are intact. No SI joint effusion or erosive changes to suggest septic arthritis. No fracture or pelvic diastasis. There is susceptibility artifact related to right total hip arthroplasty hardware. No appreciable periprosthetic fluid collection within the limitations of the exam. Diffuse intramuscular edema throughout the visualized musculature of the pelvis compatible with a nonspecific myositis. No intramuscular fluid collection. Diffuse anasarca. Transplant kidney is seen within the low pelvis with parenchymal enhancement on postcontrast sequences. Foley catheter is present within the bladder lumen. IMPRESSION: 1. Sacral decubitus ulcer with acute osteomyelitis of the distal sacrum extending from S4 to the sacrococcygeal junction. 2. Bone marrow edema extends from the S3 segment through the distal coccyx, the peripheral extent of which may represent reactive osteitis versus early acute osteomyelitis. 3. Diffuse intramuscular edema throughout the visualized musculature of the pelvis compatible with a nonspecific myositis. No intramuscular fluid collection. 4. Diffuse anasarca. These results will be called to the ordering clinician or representative by the Radiologist Assistant, and communication documented in the PACS or [employer]. Electronically Signed   By: Davina Poke D.O.   On: 01/01/2021 08:23   DG CHEST PORT 1 VIEW  Result Date: 12/25/2020 CLINICAL DATA:  Infected sacral decubitus ulcer. EXAM: PORTABLE CHEST 1 VIEW COMPARISON:  12/07/2020 FINDINGS: Poor inspiration with  increased patchy and linear density at both lung bases, left greater than right. The remainder of the lungs are clear with normal vascularity. Normal sized heart. Left axillary surgical clips. Moderate left and mild right glenohumeral joint degenerative changes. IMPRESSION: Poor inspiration with bibasilar atelectasis and possible pneumonia, left greater than right. Electronically Signed   By: Claudie Revering M.D.   On: 12/25/2020 13:22   DG Chest Port 1 View  Result Date: 12/07/2020 CLINICAL DATA:  Questionable sepsis - evaluate for abnormality Altered mental status. EXAM: PORTABLE CHEST 1 VIEW COMPARISON:  Radiograph 09/26/2020. FINDINGS: Lung volumes are low. There are streaky opacities at the left lung base with ill-defined left hemidiaphragm. Stable heart size and mediastinal contours allowing for differences in positioning. No pulmonary edema. There is no pneumothorax or large pleural effusion. Remote right rib fractures. Bones are diffusely under mineralized. IMPRESSION: Low lung volumes with streaky opacities at the left lung base, which may represent atelectasis or pneumonia. Electronically Signed   By: Keith Rake M.D.   On: 12/07/2020 21:00   ECHOCARDIOGRAM COMPLETE  Result Date: 12/08/2020    ECHOCARDIOGRAM REPORT   Patient Name:   SHARONNE RICKETTS Date of Exam: 12/08/2020 Medical Rec #:  [id number]       Height:       63.0 in Accession #:    [id number]      Weight:       115.0 lb Date of Birth:  1961-03-28      BSA:          1.528 m Patient Age:    58 years        BP:           130/71 mmHg Patient Gender: F               HR:  85 bpm. Exam Location:  Inpatient Procedure: 2D Echo, Color Doppler and Cardiac Doppler Indications:    R94.31 Abnormal EKG  History:        Patient has no prior history of Echocardiogram examinations.                 Risk Factors:Diabetes.  Sonographer:    Bernadene Person RDCS Referring Phys: [place]  1. Left ventricular ejection  fraction, by estimation, is 65 to 70%. The left ventricle has normal function. The left ventricle has no regional wall motion abnormalities. There is moderate left ventricular hypertrophy. Left ventricular diastolic parameters are consistent with Grade I diastolic dysfunction (impaired relaxation).  2. Right ventricular systolic function is normal. The right ventricular size is normal. Mildly increased right ventricular wall thickness. Tricuspid regurgitation signal is inadequate for assessing PA pressure.  3. The mitral valve is normal in structure. Trivial mitral valve regurgitation. No evidence of mitral stenosis.  4. The aortic valve was not well visualized. Aortic valve regurgitation is trivial. No aortic stenosis is present.  5. The inferior vena cava is normal in size with greater than 50% respiratory variability, suggesting right atrial pressure of 3 mmHg.  6. A small pericardial effusion is present. FINDINGS  Left Ventricle: Left ventricular ejection fraction, by estimation, is 65 to 70%. The left ventricle has normal function. The left ventricle has no regional wall motion abnormalities. The left ventricular internal cavity size was normal in size. There is  moderate left ventricular hypertrophy. Left ventricular diastolic parameters are consistent with Grade I diastolic dysfunction (impaired relaxation). Right Ventricle: The right ventricular size is normal. Mildly increased right ventricular wall thickness. Right ventricular systolic function is normal. Tricuspid regurgitation signal is inadequate for assessing PA pressure. Left Atrium: Left atrial size was normal in size. Right Atrium: Right atrial size was normal in size. Pericardium: A small pericardial effusion is present. Mitral Valve: The mitral valve is normal in structure. Trivial mitral valve regurgitation. No evidence of mitral valve stenosis. Tricuspid Valve: The tricuspid valve is normal in structure. Tricuspid valve regurgitation is not  demonstrated. Aortic Valve: The aortic valve was not well visualized. Aortic valve regurgitation is trivial. Aortic regurgitation PHT measures 520 msec. No aortic stenosis is present. Pulmonic Valve: The pulmonic valve was normal in structure. Pulmonic valve regurgitation is not visualized. Aorta: The aortic root is normal in size and structure. Venous: The inferior vena cava is normal in size with greater than 50% respiratory variability, suggesting right atrial pressure of 3 mmHg. IAS/Shunts: No atrial level shunt detected by color flow Doppler.  LEFT VENTRICLE PLAX 2D LVIDd:         2.50 cm  Diastology LVIDs:         1.20 cm  LV e' medial:    3.59 cm/s LV PW:         1.70 cm  LV E/e' medial:  15.8 LV IVS:        1.70 cm  LV e' lateral:   3.81 cm/s LVOT diam:     1.80 cm  LV E/e' lateral: 14.9 LV SV:         48 LV SV Index:   31 LVOT Area:     2.54 cm  RIGHT VENTRICLE RV S prime:     18.30 cm/s TAPSE (M-mode): 1.4 cm LEFT ATRIUM             Index      RIGHT ATRIUM  Index LA diam:        3.30 cm 2.16 cm/m RA Area:     3.33 cm LA Vol (A2C):   11.4 ml 7.46 ml/m RA Volume:   3.61 ml  2.36 ml/m LA Vol (A4C):   8.6 ml  5.62 ml/m LA Biplane Vol: 10.1 ml 6.61 ml/m  AORTIC VALVE LVOT Vmax:   96.70 cm/s LVOT Vmean:  70.700 cm/s LVOT VTI:    0.188 m AI PHT:      520 msec  AORTA Ao Root diam: 3.30 cm Ao Asc diam:  2.60 cm MITRAL VALVE MV Area (PHT): 2.56 cm    SHUNTS MV Decel Time: 296 msec    Systemic VTI:  0.19 m MV E velocity: 56.60 cm/s  Systemic Diam: 1.80 cm MV A velocity: 72.00 cm/s MV E/A ratio:  0.79 Loralie Champagne MD Electronically signed by Loralie Champagne MD Signature Date/Time: 12/08/2020/5:15:53 PM    Final    [place] EKG SITE RITE  Result Date: 01/01/2021 If Site Rite image not attached, placement could not be confirmed due to current cardiac rhythm.    Time Spent in minutes  30     Desiree Hane M.D on 01/05/2021 at 9:54 PM  To page go to [email or web address] - password  [email or web address]

## 2021-01-06 LAB — GLUCOSE, CAPILLARY
Glucose-Capillary: 109 mg/dL — ABNORMAL HIGH (ref 70–99)
Glucose-Capillary: 131 mg/dL — ABNORMAL HIGH (ref 70–99)
Glucose-Capillary: 160 mg/dL — ABNORMAL HIGH (ref 70–99)
Glucose-Capillary: 169 mg/dL — ABNORMAL HIGH (ref 70–99)
Glucose-Capillary: 76 mg/dL (ref 70–99)
Glucose-Capillary: 93 mg/dL (ref 70–99)

## 2021-01-06 LAB — RESP PANEL BY RT-PCR (FLU A&B, COVID) ARPGX2
Influenza A by PCR: NEGATIVE
Influenza B by PCR: NEGATIVE
SARS Coronavirus 2 by RT PCR: NEGATIVE

## 2021-01-06 NOTE — Plan of Care (Signed)
  Problem: Clinical Measurements: Goal: Diagnostic test results will improve Outcome: Progressing   Problem: Coping: Goal: Level of anxiety will decrease Outcome: Progressing   Problem: Pain Management: Goal: Pain level will decrease with appropriate interventions Outcome: Progressing

## 2021-01-06 NOTE — Progress Notes (Signed)
[facility]  PROGRESS NOTE  [name] GHW:[id number] DOB: 1961-12-12 DOA: 12/07/2020 PCP: Doris Croak, MD Admit date - 12/07/2020   Admitting Physician Doris Baker, MD  Outpatient Primary MD for the patient is Doris Croak, MD  LOS - 30 Brief Narrative   [name] is a 60 y.o. 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  Hospital course patient initially started on empiric vancomycin, cefepime, Flagyl.  Due to AKI in setting of renal transplant we will switch to daptomycin instead of vancomycin and completed 7 days of therapy of Flagyl and daptomycin (12/13-12/19).  She was then started on Zosyn (12/30-1/3) for presumed aspiration pneumonia (no fever, hypotension, chest x-ray with infiltrate).  She has had prolonged hospitalization to above as well as necessary hydrotherapy for sacral decubitus ulcer.  Subjective  Back pain stable. Husband at bedside A & P    Unstageable chronic decubitus ulcer, present on admission, with acute osteomyelitis of sacrum.  Completed 7-day course of vancomycin/daptomycin/Flagyl (12/13-12/19) initially on admission for presumed cellulitis.  Remains afebrile, hemodynamically stable, previous CT showed no osteomyelitis,  MRI ( 1/6//21) confirms acute osteomyelitis and bone is exposed on exam. Risk factor likely immobilization/bed bound status.  Unable to get bone biopsy as not offered by general surgery or IR -Appreciate ID recommendations,  IV ceftriaxone 2 g every 24 and doxycycline 100 mg p.o. twice daily, End date 02/12/2021 - PICC placed 01/02/2021, discussed with on-call nephrologist, Dr. Carolin Sicks and 1/6, who states given stability in creatinine and need for antibiotic therapy okay with line placement -Continue wound dressing care as recommended by wound care nursing --pain control with norco/viodin 5/325mg  q4 Hrs PRN  Aspiration pneumonia, resolved.  On room air.  Completed 5-day course of IV Zosyn.  Remains afebrile and hemodynamically stable.  AKI on CKD stage III, status post renal transplant creatinine stable at baseline.  Having normal urine output -Monitor urine output, avoid nephrotoxins, monitor BMP -Continue sodium bicarb -Continue Imuran and cyclosporine  Type 1 diabetes, status post pancreas transplant, poorly controlled recurrent hypoglycemia.  A1c 7.2.  Last hypoglycemic episode on 1/6 with glucose of 34 back in the setting of n.p.o. status for possible procedure at that time.  Was monitored off of long-acting insulin and now fasting CBGs are at goal 150s-160s.  Patient is eating well.  Need to continue long acting given patient is Type 1 diabetic - Continue Lantus at reduced dose of 4 units daily, ( home regimen is 8 U BID) -Reduced sliding scale to 0 to 6 units every 4 hours allow for better nighttime coverage -Appreciate diabetes coordinator assistance  Chronic macrocytic anemia, stable.  History of B12 deficiency hemoglobin stable at baseline. -Monitor CBC, continue V89 and folic acid supplementation  HTN, stable -Continue Lopressor  Hypothyroidism, stable  -continue Synthroid  Depression, stable -continue Zoloft  Erosive esophagitis, stable -continue PPI  Diastolic CHF, stable. TTE 12/08/20. EF 38-10%, grade 1 diastolic dysfunction Euvolemic.  --held home torsemide on admission due to AKI.  --daily weights --strict I/O's  HLD, stable -continue statin  Family Communication  : Husband  updated at bedside on 1/7  Code Status : Full  Disposition Plan  :  Patient is from SNF. Anticipated d/c date:  Medically stable for discharge. Barriers to d/c or necessity for inpatient status:  IV antibiotics via PICC, medically stable for discharge to new SNF, patient agreeable , TOC assisting with SNF Consults  : Infectious disease, nephrology  Procedures  :   TTE, 12/13 1. Left ventricular ejection fraction, by estimation, is 65 to 70%. The  left ventricle has normal function. The left ventricle has no regional  wall motion abnormalities. There is moderate left ventricular hypertrophy.  Left ventricular diastolic  parameters are consistent with Grade I diastolic dysfunction (impaired  relaxation).  2. Right ventricular systolic function is normal. The right ventricular  size is normal. Mildly increased right ventricular wall thickness.  Tricuspid regurgitation signal is inadequate for assessing PA pressure.  3. The mitral valve is normal in structure. Trivial mitral valve  regurgitation. No evidence of mitral stenosis.  4. The aortic valve was not well visualized. Aortic valve regurgitation  is trivial. No aortic stenosis is present.  5. The inferior vena cava is normal in size with greater than 50%  respiratory variability, suggesting right atrial pressure of 3 mmHg.  6. A small pericardial effusion is present.   DVT Prophylaxis  : SCDs  MDM: The below labs and imaging reports were reviewed and summarized above.  Medication management as above.  Lab Results  Component Value Date   PLT 321 01/03/2021    Diet :  Diet Order            Diet Carb Modified Fluid consistency: Thin; Room service appropriate? Yes  Diet effective now           Diet - low sodium heart healthy                  Inpatient Medications Scheduled Meds: . azaTHIOprine  50 mg Oral Daily  . Chlorhexidine Gluconate Cloth  6 each Topical Daily  . cycloSPORINE  75 mg Oral BID  . doxycycline   100 mg Oral Q12H  . feeding supplement  237 mL Oral BID BM  . folic acid  1 mg Oral Daily  . insulin aspart  0-6 Units Subcutaneous Q4H  . insulin glargine  4 Units Subcutaneous Daily  . levothyroxine  50 mcg Oral Q0600  . liver oil-zinc oxide   Topical Daily  . mouth rinse  15 mL Mouth Rinse BID  . metoprolol tartrate  12.5 mg Oral BID  . multivitamin with minerals  1 tablet Oral Daily  . nutrition supplement (JUVEN)  1 packet Oral BID BM  . nystatin   Topical TID  . pantoprazole  40 mg Oral BID  . polyethylene glycol  17 g Oral Daily  . rosuvastatin  5 mg Oral QHS  . senna-docusate  2 tablet Oral BID  . sertraline  25 mg Oral Daily  . sodium bicarbonate  650 mg Oral BID  . vitamin B-12  100 mcg Oral Daily   Continuous Infusions: . sodium chloride Stopped (01/05/21 1636)  . cefTRIAXone (ROCEPHIN)  IV 200 mL/hr at 01/05/21 1652   PRN Meds:.sodium chloride, acetaminophen **OR** acetaminophen, HYDROcodone-acetaminophen, hydrocortisone cream, ondansetron (ZOFRAN) IV, polyvinyl alcohol, sodium chloride flush  Antibiotics  :   Anti-infectives (From admission, onward)   Start     Dose/Rate Route Frequency Ordered Stop   01/01/21 2200  doxycycline (VIBRA-TABS) tablet 100 mg  100 mg Oral Every 12 hours 01/01/21 1626     01/01/21 1715  cefTRIAXone (ROCEPHIN) 2 g in sodium chloride 0.9 % 100 mL IVPB        2 g 200 mL/hr over 30 Minutes Intravenous Every 24 hours 01/01/21 1626     12/25/20 1530  piperacillin-tazobactam (ZOSYN) IVPB 3.375 g        3.375 g 12.5 mL/hr over 240 Minutes Intravenous Every 8 hours 12/25/20 1421 12/30/20 1001   12/11/20 1500  ceFEPIme (MAXIPIME) 2 g in sodium chloride 0.9 % 100 mL IVPB        2 g 200 mL/hr over 30 Minutes Intravenous Every 12 hours 12/11/20 1358 12/14/20 1730   12/10/20 2000  DAPTOmycin (CUBICIN) 300 mg in sodium chloride 0.9 % IVPB        300 mg 212 mL/hr over 30 Minutes Intravenous Daily 12/10/20 0946 12/14/20 2250   12/08/20 2100   vancomycin (VANCOREADY) IVPB 500 mg/100 mL  Status:  Discontinued        500 mg 100 mL/hr over 60 Minutes Intravenous Every 24 hours 12/08/20 0040 12/10/20 0946   12/08/20 2000  ceFEPIme (MAXIPIME) 2 g in sodium chloride 0.9 % 100 mL IVPB  Status:  Discontinued        2 g 200 mL/hr over 30 Minutes Intravenous Every 24 hours 12/08/20 0036 12/11/20 1358   12/08/20 1400  metroNIDAZOLE (FLAGYL) tablet 500 mg        500 mg Oral Every 8 hours 12/08/20 1049 12/14/20 2210   12/08/20 0600  metroNIDAZOLE (FLAGYL) IVPB 500 mg  Status:  Discontinued        500 mg 100 mL/hr over 60 Minutes Intravenous Every 8 hours 12/07/20 2248 12/08/20 1049   12/07/20 2030  ceFEPIme (MAXIPIME) 2 g in sodium chloride 0.9 % 100 mL IVPB        2 g 200 mL/hr over 30 Minutes Intravenous  Once 12/07/20 2018 12/07/20 2105   12/07/20 2030  metroNIDAZOLE (FLAGYL) IVPB 500 mg        500 mg 100 mL/hr over 60 Minutes Intravenous  Once 12/07/20 2018 12/07/20 2321   12/07/20 2030  vancomycin (VANCOCIN) IVPB 1000 mg/200 mL premix        1,000 mg 200 mL/hr over 60 Minutes Intravenous  Once 12/07/20 2018 12/07/20 2212       Objective   Vitals:   01/05/21 2111 01/06/21 0542 01/06/21 0601 01/06/21 1152  BP: (!) 118/57 117/68  (!) 120/58  Pulse: 88 67  67  Resp: 18 18  19   Temp: 98.7 F (37.1 C) 98.1 F (36.7 C)  (!) 97.4 F (36.3 C)  TempSrc:  Oral    SpO2: 96% 98%  97%  Weight:   73 kg   Height:        SpO2: 97 %  Wt Readings from Last 3 Encounters:  01/06/21 73 kg  11/12/20 58.8 kg  11/02/20 64.4 kg     Intake/Output Summary (Last 24 hours) at 01/06/2021 1651 Last data filed at 01/06/2021 0900 Gross per 24 hour  Intake 694.86 ml  Output 1175 ml  Net -480.14 ml    Physical Exam:     Awake Alert, Oriented X 3, Normal affect No new F.N deficits,  [place].AT, Normal respiratory effort on room air, CTAB RRR,No Gallops,Rubs or new Murmurs,  +ve B.Sounds, Abd Soft, No tenderness, No rebound, guarding or  rigidity. No Cyanosis, No new Rash or bruise  01/04/21  I have personally reviewed the following:   Data Reviewed:  CBC Recent Labs  Lab 12/31/20 0830 01/01/21 0609 01/03/21 1600  WBC 5.0 4.6 4.9  HGB 8.2* 8.1* 8.4*  HCT 26.2* 26.5* 26.5*  PLT 311 315 321  MCV 112.4* 113.2* 112.3*  MCH 35.2* 34.6* 35.6*  MCHC 31.3 30.6 31.7  RDW 25.0* 24.4* 23.7*    Chemistries  Recent Labs  Lab 12/31/20 0830 01/01/21 0609  NA 138 135  K 4.7 5.1  CL 101 100  CO2 27 27  GLUCOSE 140* 177*  BUN 59* 67*  CREATININE 1.05* 0.97  CALCIUM 9.5 9.4  MG 2.0  --   AST  --  15  ALT  --  12  ALKPHOS  --  103  BILITOT  --  0.5   ------------------------------------------------------------------------------------------------------------------ No results for input(s): CHOL, HDL, LDLCALC, TRIG, CHOLHDL, LDLDIRECT in the last 72 hours.  Lab Results  Component Value Date   HGBA1C 8.6 (H) 12/08/2020   ------------------------------------------------------------------------------------------------------------------ No results for input(s): TSH, T4TOTAL, T3FREE, THYROIDAB in the last 72 hours.  Invalid input(s): FREET3 ------------------------------------------------------------------------------------------------------------------ No results for input(s): VITAMINB12, FOLATE, FERRITIN, TIBC, IRON, RETICCTPCT in the last 72 hours.  Coagulation profile No results for input(s): INR, PROTIME in the last 168 hours.  No results for input(s): DDIMER in the last 72 hours.  Cardiac Enzymes No results for input(s): CKMB, TROPONINI, MYOGLOBIN in the last 168 hours.  Invalid input(s): CK ------------------------------------------------------------------------------------------------------------------ No results found for: BNP  Micro Results No results found for this or any previous visit (from the past 240 hour(s)).  Radiology Reports DG Tibia/Fibula Right  Result Date:  12/31/2020 CLINICAL DATA:  Amputation stump pain, bleeding, per elite wound EXAM: RIGHT TIBIA AND FIBULA - 2 VIEW COMPARISON:  None. FINDINGS: Status post below the knee amputation of the right tibia and fibula. No bony erosion or sclerosis. Soft tissue wound of the stump tip. Vascular calcinosis. The included knee joint is well preserved. IMPRESSION: Status post below the knee amputation of the right tibia and fibula. No bony erosion or sclerosis to suggest osteomyelitis. Soft tissue wound of the stump tip. MRI is the test of choice for the evaluation of bone marrow edema and osteomyelitis if clinically suspected. Electronically Signed   By: Eddie Candle M.D.   On: 12/31/2020 16:41   CT PELVIS WO CONTRAST  Result Date: 12/07/2020 CLINICAL DATA:  Sacral ulcer, concern for osteomyelitis EXAM: CT PELVIS WITHOUT CONTRAST TECHNIQUE: Multidetector CT imaging of the pelvis was performed following the standard protocol without intravenous contrast. COMPARISON:  None. FINDINGS: Urinary Tract: Urinary bladder is markedly distended to the level of the umbilicus. There appears to be a renal transplant kidney in the cul-de-sac of the pelvis. Mild caliectasis. Bowel:  Unremarkable visualized pelvic bowel loops. Vascular/Lymphatic: Heavily calcified aorta and iliac vessels. No adenopathy. Reproductive:  No visible pelvic mass. Other:  No free fluid or free air. Musculoskeletal: Sacral decubital ulcer noted. No bone destruction in the sacrum or coccyx to suggest osteomyelitis. Prior right hip replacement. IMPRESSION: Sacral decubitus ulcer.  No evidence of osteomyelitis. Apparent renal transplant within the cul-de-sac of the pelvis. Mild caliectasis noted within the renal transplant. Urinary bladder is markedly distended to the level of the umbilicus. Aortoiliac atherosclerosis. Electronically Signed   By: Rolm Baptise M.D.   On: 12/07/2020 21:51   MR PELVIS W WO CONTRAST  Result Date: 01/01/2021 CLINICAL DATA:  Chronic  sacral decubitus ulcer EXAM: MRI PELVIS WITHOUT AND WITH CONTRAST TECHNIQUE: Multiplanar multisequence  MR imaging of the pelvis was performed both before and after administration of intravenous contrast. CONTRAST:  54mL GADAVIST GADOBUTROL 1 MMOL/ML IV SOLN COMPARISON:  CT 12/07/2020 FINDINGS: Sacral decubitus ulcer overlies the mid to distal aspect of the sacrum. Thin fluid and air collection contiguous with the ulcer base extend into the dorsal cortex of the sacrum. Bone marrow edema and confluent low T1 signal is present within the distal sacrum extending from S4 to the sacrococcygeal junction (series 8, images 20-24). Bone marrow edema extends from the S3 segment through the distal coccyx (series 4, images 19-21). Bilateral SI joints are intact. No SI joint effusion or erosive changes to suggest septic arthritis. No fracture or pelvic diastasis. There is susceptibility artifact related to right total hip arthroplasty hardware. No appreciable periprosthetic fluid collection within the limitations of the exam. Diffuse intramuscular edema throughout the visualized musculature of the pelvis compatible with a nonspecific myositis. No intramuscular fluid collection. Diffuse anasarca. Transplant kidney is seen within the low pelvis with parenchymal enhancement on postcontrast sequences. Foley catheter is present within the bladder lumen. IMPRESSION: 1. Sacral decubitus ulcer with acute osteomyelitis of the distal sacrum extending from S4 to the sacrococcygeal junction. 2. Bone marrow edema extends from the S3 segment through the distal coccyx, the peripheral extent of which may represent reactive osteitis versus early acute osteomyelitis. 3. Diffuse intramuscular edema throughout the visualized musculature of the pelvis compatible with a nonspecific myositis. No intramuscular fluid collection. 4. Diffuse anasarca. These results will be called to the ordering clinician or representative by the Radiologist Assistant, and  communication documented in the PACS or [employer]. Electronically Signed   By: Davina Poke D.O.   On: 01/01/2021 08:23   DG CHEST PORT 1 VIEW  Result Date: 12/25/2020 CLINICAL DATA:  Infected sacral decubitus ulcer. EXAM: PORTABLE CHEST 1 VIEW COMPARISON:  12/07/2020 FINDINGS: Poor inspiration with increased patchy and linear density at both lung bases, left greater than right. The remainder of the lungs are clear with normal vascularity. Normal sized heart. Left axillary surgical clips. Moderate left and mild right glenohumeral joint degenerative changes. IMPRESSION: Poor inspiration with bibasilar atelectasis and possible pneumonia, left greater than right. Electronically Signed   By: Claudie Revering M.D.   On: 12/25/2020 13:22   DG Chest Port 1 View  Result Date: 12/07/2020 CLINICAL DATA:  Questionable sepsis - evaluate for abnormality Altered mental status. EXAM: PORTABLE CHEST 1 VIEW COMPARISON:  Radiograph 09/26/2020. FINDINGS: Lung volumes are low. There are streaky opacities at the left lung base with ill-defined left hemidiaphragm. Stable heart size and mediastinal contours allowing for differences in positioning. No pulmonary edema. There is no pneumothorax or large pleural effusion. Remote right rib fractures. Bones are diffusely under mineralized. IMPRESSION: Low lung volumes with streaky opacities at the left lung base, which may represent atelectasis or pneumonia. Electronically Signed   By: Keith Rake M.D.   On: 12/07/2020 21:00   ECHOCARDIOGRAM COMPLETE  Result Date: 12/08/2020    ECHOCARDIOGRAM REPORT   Patient Name:   PORSHA SKILTON Date of Exam: 12/08/2020 Medical Rec #:  [id number]       Height:       63.0 in Accession #:    [id number]      Weight:       115.0 lb Date of Birth:  03-02-1961      BSA:          1.528 m Patient Age:    39 years  BP:           130/71 mmHg Patient Gender: F               HR:           85 bpm. Exam Location:  Inpatient Procedure: 2D  Echo, Color Doppler and Cardiac Doppler Indications:    R94.31 Abnormal EKG  History:        Patient has no prior history of Echocardiogram examinations.                 Risk Factors:Diabetes.  Sonographer:    Bernadene Person RDCS Referring Phys: [place]  1. Left ventricular ejection fraction, by estimation, is 65 to 70%. The left ventricle has normal function. The left ventricle has no regional wall motion abnormalities. There is moderate left ventricular hypertrophy. Left ventricular diastolic parameters are consistent with Grade I diastolic dysfunction (impaired relaxation).  2. Right ventricular systolic function is normal. The right ventricular size is normal. Mildly increased right ventricular wall thickness. Tricuspid regurgitation signal is inadequate for assessing PA pressure.  3. The mitral valve is normal in structure. Trivial mitral valve regurgitation. No evidence of mitral stenosis.  4. The aortic valve was not well visualized. Aortic valve regurgitation is trivial. No aortic stenosis is present.  5. The inferior vena cava is normal in size with greater than 50% respiratory variability, suggesting right atrial pressure of 3 mmHg.  6. A small pericardial effusion is present. FINDINGS  Left Ventricle: Left ventricular ejection fraction, by estimation, is 65 to 70%. The left ventricle has normal function. The left ventricle has no regional wall motion abnormalities. The left ventricular internal cavity size was normal in size. There is  moderate left ventricular hypertrophy. Left ventricular diastolic parameters are consistent with Grade I diastolic dysfunction (impaired relaxation). Right Ventricle: The right ventricular size is normal. Mildly increased right ventricular wall thickness. Right ventricular systolic function is normal. Tricuspid regurgitation signal is inadequate for assessing PA pressure. Left Atrium: Left atrial size was normal in size. Right Atrium: Right  atrial size was normal in size. Pericardium: A small pericardial effusion is present. Mitral Valve: The mitral valve is normal in structure. Trivial mitral valve regurgitation. No evidence of mitral valve stenosis. Tricuspid Valve: The tricuspid valve is normal in structure. Tricuspid valve regurgitation is not demonstrated. Aortic Valve: The aortic valve was not well visualized. Aortic valve regurgitation is trivial. Aortic regurgitation PHT measures 520 msec. No aortic stenosis is present. Pulmonic Valve: The pulmonic valve was normal in structure. Pulmonic valve regurgitation is not visualized. Aorta: The aortic root is normal in size and structure. Venous: The inferior vena cava is normal in size with greater than 50% respiratory variability, suggesting right atrial pressure of 3 mmHg. IAS/Shunts: No atrial level shunt detected by color flow Doppler.  LEFT VENTRICLE PLAX 2D LVIDd:         2.50 cm  Diastology LVIDs:         1.20 cm  LV e' medial:    3.59 cm/s LV PW:         1.70 cm  LV E/e' medial:  15.8 LV IVS:        1.70 cm  LV e' lateral:   3.81 cm/s LVOT diam:     1.80 cm  LV E/e' lateral: 14.9 LV SV:         48 LV SV Index:   31 LVOT Area:     2.54 cm  RIGHT  VENTRICLE RV S prime:     18.30 cm/s TAPSE (M-mode): 1.4 cm LEFT ATRIUM             Index      RIGHT ATRIUM          Index LA diam:        3.30 cm 2.16 cm/m RA Area:     3.33 cm LA Vol (A2C):   11.4 ml 7.46 ml/m RA Volume:   3.61 ml  2.36 ml/m LA Vol (A4C):   8.6 ml  5.62 ml/m LA Biplane Vol: 10.1 ml 6.61 ml/m  AORTIC VALVE LVOT Vmax:   96.70 cm/s LVOT Vmean:  70.700 cm/s LVOT VTI:    0.188 m AI PHT:      520 msec  AORTA Ao Root diam: 3.30 cm Ao Asc diam:  2.60 cm MITRAL VALVE MV Area (PHT): 2.56 cm    SHUNTS MV Decel Time: 296 msec    Systemic VTI:  0.19 m MV E velocity: 56.60 cm/s  Systemic Diam: 1.80 cm MV A velocity: 72.00 cm/s MV E/A ratio:  0.79 Loralie Champagne MD Electronically signed by Loralie Champagne MD Signature Date/Time:  12/08/2020/5:15:53 PM    Final    [place] EKG SITE RITE  Result Date: 01/01/2021 If Site Rite image not attached, placement could not be confirmed due to current cardiac rhythm.    Time Spent in minutes  30     Desiree Hane M.D on 01/06/2021 at 4:51 PM  To page go to [email or web address] - password [facility]

## 2021-01-06 NOTE — Plan of Care (Signed)
  Problem: Clinical Measurements: Goal: Diagnostic test results will improve Outcome: Progressing   Problem: Nutrition: Goal: Adequate nutrition will be maintained Outcome: Progressing   Problem: Safety: Goal: Ability to remain free from injury will improve Outcome: Progressing   Problem: Pain Management: Goal: Pain level will decrease with appropriate interventions Outcome: Progressing

## 2021-01-06 NOTE — TOC Progression Note (Signed)
Transition of Care [facility]) - Progression Note    Patient Details  Name: [name] MRN: [id number] Date of Birth: [birth year]  Transition of Care [facility]) CM/SW Contact  Greig Altergott, Juliann Pulse, RN Phone Number: 01/06/2021, 10:06 AM  Clinical Narrative: Awaiting call back from [facility] in [place] if can accept with iv abx,picc,wound care;currently per PT not on 1/6-Total asst non ambulatory;only skill is nursing currently if appropriate will inform SNF of nursing-wound care,iv abx. Will need to get auth once clarified.      Expected Discharge Plan: Skilled Nursing Facility Barriers to Discharge: Continued Medical Work up  Expected Discharge Plan and Services Expected Discharge Plan: [place]   Discharge Planning Services: CM Consult Post Acute Care Choice: [place] Living arrangements for the past 2 months: Single Family Home                               Date [place]: 12/30/20 Time [place]: 2355     Social Determinants of Health (SDOH) Interventions    Readmission Risk Interventions No flowsheet data found.

## 2021-01-06 NOTE — TOC Progression Note (Signed)
Transition of Care [facility]) - Progression Note    Patient Details  Name: [name] MRN: [id number] Date of Birth: [birth year]  [facility]) CM/SW Contact  Davaris Youtsey, Juliann Pulse, RN Phone Number: 01/06/2021, 1:16 PM  Clinical Narrative: [place] can accept-facility will start auth-await outcome. Covid to be ordered.      Expected Discharge Plan: Skilled Nursing Facility Barriers to Discharge: Insurance Authorization  Expected Discharge Plan and Services Expected Discharge Plan: [place]   Discharge Planning Services: CM Consult Post Acute Care Choice: [place] Living arrangements for the past 2 months: Single Family Home                               Date [place]: 12/30/20 Time [place]: 1338     Social Determinants of Health (SDOH) Interventions    Readmission Risk Interventions No flowsheet data found.

## 2021-01-06 NOTE — Progress Notes (Signed)
Nutrition Follow-up  DOCUMENTATION CODES:   Non-severe (moderate) malnutrition in context of chronic illness  INTERVENTION:  - continue Ensure Enlive BID and 1 packet Juven BID.   NUTRITION DIAGNOSIS:   Moderate Malnutrition related to chronic illness (stage 3 CKD) as evidenced by mild fat depletion,mild muscle depletion,moderate muscle depletion. -ongoing  GOAL:   Patient will meet greater than or equal to 90% of their needs -variably met  MONITOR:   PO intake,Supplement acceptance,Labs,Weight trends  ASSESSMENT:   60 year old female with medical history of chronic stage 3 pressure injury, multiple L heel wounds, type 1 DM, renal and pancreas transplant (1989), stage 3 CKD, HTN, prior R BKA d/t osteomyelitis. She was hospitalized from 11/13-11/17 due to coffee-ground emesis and was found to have severe esophagitis. She was then discharged back to SNF and again presented to the ED on 12/12 with AMS and increased drainage and odor from sacral wound with concern for infection.  Intakes; of meals have been 25-75% over the past 3 days with patient most recently consuming 50% of breakfast (210 kcal, 7 grams protein) this AM.  She continues to accept Ensure nearly 100% of the time offered. She was previously accepting Juven nearly 100% of the time offered, but over the past week she has been accepting it 50% of the time.   Weight -4 lb over the past 1 week; weight has been fluctuating throughout hospitalization.   Plan is for SNF at time of d/c. MD note from 1/10 stated that patient is medically stable for d/c.     Labs reviewed; CBGs: 160 and 109 mg/dl.  Medications reviewed; 1 mg folvite/day, sliding scale novolog, 50 mcg oral synthroid/day, 1 tablet multivitamin with minerals/day, 40 mg oral protonix BID, 17 g miralax/day, 2 tablets senokot BID, 650 mg sodium bicarb BID, 100 mcg oral cyanocobalamin/day.    Diet Order:   Diet Order            Diet Carb Modified Fluid consistency:  Thin; Room service appropriate? Yes  Diet effective now           Diet - low sodium heart healthy                 EDUCATION NEEDS:   No education needs have been identified at this time  Skin:  Skin Assessment: Skin Integrity Issues: Skin Integrity Issues:: Stage II,Stage IV,Other (Comment) Stage II: L heel Stage IV: sacrum (changed from unstageable to stage 4 documentation on 12/29) Unstageable: sacrum Incisions: L hand (12/16) Other: MASD to bilateral buttocks  Last BM:  1/10 (type 1)  Height:   Ht Readings from Last 1 Encounters:  12/30/20 _0  (1.6 m)    Weight:   Wt Readings from Last 1 Encounters:  01/06/21 73 kg     Estimated Nutritional Needs:  Kcal:  1825-2090 kcal Protein:  90-105 grams Fluid:  >/= 2 L/day     Jarome Matin, MS, RD, LDN, CNSC Inpatient Clinical Dietitian RD pager # available in AMION  After hours/weekend pager # available in [facility]

## 2021-01-07 DIAGNOSIS — E440 Moderate protein-calorie malnutrition: Secondary | ICD-10-CM | POA: Insufficient documentation

## 2021-01-07 LAB — GLUCOSE, CAPILLARY
Glucose-Capillary: 160 mg/dL — ABNORMAL HIGH (ref 70–99)
Glucose-Capillary: 95 mg/dL (ref 70–99)
Glucose-Capillary: 78 mg/dL (ref 70–99)
Glucose-Capillary: 79 mg/dL (ref 70–99)
Glucose-Capillary: 89 mg/dL (ref 70–99)

## 2021-01-07 NOTE — TOC Progression Note (Signed)
Transition of Care [facility]) - Progression Note    Patient Details  Name: [name] MRN: [id number] Date of Birth: Dec 25, 1961  [facility] CM/SW Contact  Gladys Gutman, Juliann Pulse, RN Phone Number: 01/07/2021, 11:16 AM  Clinical Narrative:TC [facility]-rep-Debbie still awaiting auth. CM has contacted [employer] supv-Angela to asst w/expiditing the auth for SNF.       Expected Discharge Plan: Skilled Nursing Facility Barriers to Discharge: Insurance Authorization  Expected Discharge Plan and Services Expected Discharge Plan: [place]   Discharge Planning Services: CM Consult Post Acute Care Choice: [place] Living arrangements for the past 2 months: Single Family Home                               Date [place]: 12/30/20 Time [place]: 1338     Social Determinants of Health (SDOH) Interventions    Readmission Risk Interventions No flowsheet data found.

## 2021-01-07 NOTE — Progress Notes (Signed)
PROGRESS NOTE    [name]  EXB:[id number] DOB: 10-09-1961 DOA: 12/07/2020 PCP: [name], MD     Brief Narrative:  [name] is a 60 y.o. 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  Hospital course patient initially started on empiric vancomycin, cefepime, Flagyl.  Due to AKI in setting of renal transplant we will switch to daptomycin instead of vancomycin and completed 7 days of therapy of Flagyl and daptomycin (12/13-12/19).  She was then started on Zosyn (12/30-1/3) for presumed aspiration pneumonia (no fever, hypotension, chest x-ray with infiltrate).  She has had prolonged hospitalization to above as well as necessary hydrotherapy for sacral decubitus ulcer.  New events last 24 hours / Subjective: No new issues overnight, no complaints today   Assessment & Plan:   Active Problems:   Anemia secondary to renal failure   S/p cadaver renal transplant   Chronic kidney disease (CKD) stage G3a/A1, moderately decreased glomerular filtration rate (GFR) between 45-59 mL/min/1.73 square meter and albuminuria creatinine ratio less than 30 mg/g (HCC)   Hypertension   Acute kidney injury superimposed on CKD ([place])   Type 1 diabetes mellitus with complication, with long term current use of insulin pump (HCC)   Pressure ulcer of BKA stump ([place])   Long-term use of immunosuppressant medication   Sepsis ([place])   Hypoalbuminemia   Hyponatremia   CAP (community acquired pneumonia)   Acute  urinary retention   Acute metabolic encephalopathy   Sacral decubitus ulcer, stage IV (HCC)   Cellulitis   Hypotension   Malodorous urine   Erosive esophagitis   Hypothyroid   Depression   Type 1 diabetes mellitus with hyperlipidemia (HCC)   Unspecified severe protein-calorie malnutrition (HCC)   Failure to thrive in adult   Acute osteomyelitis of sacrum (HCC)   Malnutrition of moderate degree   Unstageable chronic decubitus ulcer, present on admission, with acute osteomyelitis of sacrum.   -Completed 7-day course of vancomycin/daptomycin/Flagyl (12/13-12/19) initially on admission for presumed cellulitis.  Remains afebrile, hemodynamically stable, previous CT showed no osteomyelitis,  MRI (01/02/20) confirms acute osteomyelitis and bone is exposed on exam. Risk factor likely immobilization/bed bound status.  Unable to get bone biopsy as not offered by general surgery or IR -Appreciate ID recommendations, IV ceftriaxone 2 g every 24 and doxycycline 100 mg p.o. twice daily, End date 02/12/2021 - PICC placed 01/02/2021, per nephrologist, Dr. Carolin Sicks and 1/6, who states given stability in creatinine and need for antibiotic therapy okay with line placement -Continue wound dressing care as recommended by wound care nursing  Aspiration pneumonia, resolved -On room air.  Completed 5-day course of IV Zosyn.  Remains afebrile and hemodynamically stable.  AKI on CKD stage III, status post renal transplant creatinine stable at baseline -Monitor urine output, avoid nephrotoxins, monitor BMP -Continue sodium bicarb -Continue Imuran and cyclosporine  Type 1 diabetes, status post pancreas transplant, poorly controlled recurrent hypoglycemia.   -A1c 7.2 - Continue Lantus, SSI  Chronic macrocytic anemia, stable -History of B12 deficiency hemoglobin stable at baseline  HTN, stable -Continue Lopressor  Hypothyroidism, stable  -Continue Synthroid  Depression, stable -Continue  Zoloft  Erosive esophagitis, stable -Continue PPI  Diastolic CHF, stable.  -TTE 12/08/20. EF 14-43%, grade 1 diastolic dysfunction. Euvolemic.   HLD, stable -Continue statin   In agreement with assessment of the pressure ulcer as below:  Pressure Injury 12/10/20 Heel Left;Posterior Stage 2 -  Partial thickness loss of dermis presenting as a shallow open injury with a red, pink wound bed without slough. (Active)  12/10/20 1325  Location: Heel  Location Orientation: Left;Posterior  Staging: Stage 2 -  Partial thickness loss of dermis presenting as a shallow open injury with a red, pink wound bed without slough.  Wound Description (Comments):   Present on Admission: Yes     Pressure Injury 12/24/20 Sacrum Stage 4 - Full thickness tissue loss with exposed bone, tendon or muscle. (Active)  12/24/20   Location: Sacrum  Location Orientation:   Staging: Stage 4 - Full thickness tissue loss with exposed bone, tendon or muscle.  Wound Description (Comments):   Present on Admission: Yes     Nutrition Problem: Moderate Malnutrition Etiology: chronic illness (stage 3 CKD)   DVT prophylaxis:  SCDs Start: 12/08/20 0053  Code Status: Full Family Communication: None at bedside Disposition Plan:  Status is: Inpatient  Remains inpatient appropriate because:Unsafe d/c plan   Dispo: The patient is from: SNF              Anticipated d/c is to: SNF              Anticipated d/c date is: 1 day              Patient currently is medically stable to d/c.  Awaiting insurance authorization for SNF discharge   Antimicrobials:  Anti-infectives (From admission, onward)   Start     Dose/Rate Route Frequency Ordered Stop   01/01/21 2200  doxycycline (VIBRA-TABS) tablet 100 mg        100 mg Oral Every 12 hours 01/01/21 1626     01/01/21 1715  cefTRIAXone (ROCEPHIN) 2 g in sodium chloride 0.9 % 100 mL IVPB        2 g 200 mL/hr over 30 Minutes Intravenous Every 24 hours 01/01/21 1626      12/25/20 1530  piperacillin-tazobactam (ZOSYN) IVPB 3.375 g        3.375 g 12.5 mL/hr over 240 Minutes Intravenous Every 8 hours 12/25/20 1421 12/30/20 1001   12/11/20 1500  ceFEPIme (MAXIPIME) 2 g in sodium chloride 0.9 % 100 mL IVPB        2 g 200 mL/hr over 30 Minutes Intravenous Every 12 hours 12/11/20 1358 12/14/20 1730   12/10/20 2000  DAPTOmycin (CUBICIN) 300 mg in sodium chloride 0.9 % IVPB        300 mg 212 mL/hr over 30 Minutes Intravenous Daily 12/10/20 0946 12/14/20 2250   12/08/20 2100  vancomycin (VANCOREADY) IVPB 500 mg/100 mL  Status:  Discontinued        500 mg 100 mL/hr over 60 Minutes Intravenous Every 24 hours 12/08/20 0040 12/10/20 0946   12/08/20 2000  ceFEPIme (MAXIPIME) 2 g in sodium chloride 0.9 % 100 mL IVPB  Status:  Discontinued        2 g 200 mL/hr over 30 Minutes Intravenous Every 24 hours 12/08/20 0036 12/11/20 1358   12/08/20 1400  metroNIDAZOLE (FLAGYL) tablet 500 mg        500 mg Oral Every 8 hours 12/08/20 1049 12/14/20 2210  12/08/20 0600  metroNIDAZOLE (FLAGYL) IVPB 500 mg  Status:  Discontinued        500 mg 100 mL/hr over 60 Minutes Intravenous Every 8 hours 12/07/20 2248 12/08/20 1049   12/07/20 2030  ceFEPIme (MAXIPIME) 2 g in sodium chloride 0.9 % 100 mL IVPB        2 g 200 mL/hr over 30 Minutes Intravenous  Once 12/07/20 2018 12/07/20 2105   12/07/20 2030  metroNIDAZOLE (FLAGYL) IVPB 500 mg        500 mg 100 mL/hr over 60 Minutes Intravenous  Once 12/07/20 2018 12/07/20 2321   12/07/20 2030  vancomycin (VANCOCIN) IVPB 1000 mg/200 mL premix        1,000 mg 200 mL/hr over 60 Minutes Intravenous  Once 12/07/20 2018 12/07/20 2212        Objective: Vitals:   01/06/21 2022 01/07/21 0454 01/07/21 0600 01/07/21 1400  BP: 132/66 114/61  104/60  Pulse: 82 69  68  Resp: 18     Temp: 98.1 F (36.7 C) 98.1 F (36.7 C)  98 F (36.7 C)  TempSrc:    Oral  SpO2: 97% 97%  97%  Weight:   72 kg   Height:        Intake/Output Summary (Last 24  hours) at 01/07/2021 1537 Last data filed at 01/07/2021 1300 Gross per 24 hour  Intake 801.87 ml  Output 2325 ml  Net -1523.13 ml   Filed Weights   01/05/21 0419 01/06/21 0601 01/07/21 0600  Weight: 77 kg 73 kg 72 kg    Examination:  General exam: Appears calm and comfortable  Respiratory system: Clear to auscultation. Respiratory effort normal. No respiratory distress. No conversational dyspnea.  Cardiovascular system: S1 & S2 heard, RRR. No murmurs. No pedal edema. Gastrointestinal system: Abdomen is nondistended, soft and nontender. Normal bowel sounds heard. Central nervous system: Alert and oriented.  Extremities: Symmetric in appearance  Psychiatry: Judgement and insight appear normal. Mood & affect appropriate.   Data Reviewed: I have personally reviewed following labs and imaging studies  CBC: Recent Labs  Lab 01/01/21 0609 01/03/21 1600  WBC 4.6 4.9  HGB 8.1* 8.4*  HCT 26.5* 26.5*  MCV 113.2* 112.3*  PLT 315 808   Basic Metabolic Panel: Recent Labs  Lab 01/01/21 0609  NA 135  K 5.1  CL 100  CO2 27  GLUCOSE 177*  BUN 67*  CREATININE 0.97  CALCIUM 9.4   GFR: Estimated Creatinine Clearance: 57.9 mL/min (by C-G formula based on SCr of 0.97 mg/dL). Liver Function Tests: Recent Labs  Lab 01/01/21 0609  AST 15  ALT 12  ALKPHOS 103  BILITOT 0.5  PROT 5.6*  ALBUMIN 2.2*   No results for input(s): LIPASE, AMYLASE in the last 168 hours. No results for input(s): AMMONIA in the last 168 hours. Coagulation Profile: No results for input(s): INR, PROTIME in the last 168 hours. Cardiac Enzymes: No results for input(s): CKTOTAL, CKMB, CKMBINDEX, TROPONINI in the last 168 hours. BNP (last 3 results) No results for input(s): PROBNP in the last 8760 hours. HbA1C: No results for input(s): HGBA1C in the last 72 hours. CBG: Recent Labs  Lab 01/06/21 2020 01/06/21 2348 01/07/21 0357 01/07/21 0725 01/07/21 1122  GLUCAP 93 131* 160* 95 78   Lipid  Profile: No results for input(s): CHOL, HDL, LDLCALC, TRIG, CHOLHDL, LDLDIRECT in the last 72 hours. Thyroid Function Tests: No results for input(s): TSH, T4TOTAL, FREET4, T3FREE, THYROIDAB in the last 72 hours. Anemia Panel: No  results for input(s): VITAMINB12, FOLATE, FERRITIN, TIBC, IRON, RETICCTPCT in the last 72 hours. Sepsis Labs: No results for input(s): PROCALCITON, LATICACIDVEN in the last 168 hours.  Recent Results (from the past 240 hour(s))  Resp Panel by RT-PCR (Flu A&B, Covid) Nasopharyngeal Swab     Status: None   Collection Time: 01/06/21  5:24 PM   Specimen: Nasopharyngeal Swab; Nasopharyngeal(NP) swabs in vial transport medium  Result Value Ref Range Status   SARS Coronavirus 2 by RT PCR NEGATIVE NEGATIVE Final    Comment: (NOTE) SARS-CoV-2 target nucleic acids are NOT DETECTED.  The SARS-CoV-2 RNA is generally detectable in upper respiratory specimens during the acute phase of infection. The lowest concentration of SARS-CoV-2 viral copies this assay can detect is 138 copies/mL. A negative result does not preclude SARS-Cov-2 infection and should not be used as the sole basis for treatment or other patient management decisions. A negative result may occur with  improper specimen collection/handling, submission of specimen other than nasopharyngeal swab, presence of viral mutation(s) within the areas targeted by this assay, and inadequate number of viral copies(<138 copies/mL). A negative result must be combined with clinical observations, patient history, and epidemiological information. The expected result is Negative.  Fact Sheet for Patients:  EntrepreneurPulse.com.au  Fact Sheet for Healthcare Providers:  IncredibleEmployment.be  This test is no t yet approved or cleared by the [place] FDA and  has been authorized for detection and/or diagnosis of SARS-CoV-2 by FDA under an Emergency Use Authorization (EUA). This EUA  will remain  in effect (meaning this test can be used) for the duration of the COVID-19 declaration under Section 564(b)(1) of the Act, 21 U.S.C.section 360bbb-3(b)(1), unless the authorization is terminated  or revoked sooner.       Influenza A by PCR NEGATIVE NEGATIVE Final   Influenza B by PCR NEGATIVE NEGATIVE Final    Comment: (NOTE) The Xpert Xpress SARS-CoV-2/FLU/RSV plus assay is intended as an aid in the diagnosis of influenza from Nasopharyngeal swab specimens and should not be used as a sole basis for treatment. Nasal washings and aspirates are unacceptable for Xpert Xpress SARS-CoV-2/FLU/RSV testing.  Fact Sheet for Patients: EntrepreneurPulse.com.au  Fact Sheet for Healthcare Providers: IncredibleEmployment.be  This test is not yet approved or cleared by the [place] FDA and has been authorized for detection and/or diagnosis of SARS-CoV-2 by FDA under an Emergency Use Authorization (EUA). This EUA will remain in effect (meaning this test can be used) for the duration of the COVID-19 declaration under Section 564(b)(1) of the Act, 21 U.S.C. section 360bbb-3(b)(1), unless the authorization is terminated or revoked.  Performed at [facility], [place] [address], [place], [place] [address]       Radiology Studies: No results found.    Scheduled Meds: . azaTHIOprine  50 mg Oral Daily  . Chlorhexidine Gluconate Cloth  6 each Topical Daily  . cycloSPORINE  75 mg Oral BID  . doxycycline  100 mg Oral Q12H  . feeding supplement  237 mL Oral BID BM  . folic acid  1 mg Oral Daily  . insulin aspart  0-6 Units Subcutaneous Q4H  . insulin glargine  4 Units Subcutaneous Daily  . levothyroxine  50 mcg Oral Q0600  . liver oil-zinc oxide   Topical Daily  . mouth rinse  15 mL Mouth Rinse BID  . metoprolol tartrate  12.5 mg Oral BID  . multivitamin with minerals  1 tablet Oral Daily  . nutrition supplement (JUVEN)   1  packet Oral BID BM  . nystatin   Topical TID  . pantoprazole  40 mg Oral BID  . polyethylene glycol  17 g Oral Daily  . rosuvastatin  5 mg Oral QHS  . senna-docusate  2 tablet Oral BID  . sertraline  25 mg Oral Daily  . sodium bicarbonate  650 mg Oral BID  . vitamin B-12  100 mcg Oral Daily   Continuous Infusions: . sodium chloride 250 mL (01/06/21 2352)  . cefTRIAXone (ROCEPHIN)  IV 2 g (01/06/21 1730)     LOS: 31 days      Time spent: 25 minutes   Dessa Phi, DO [facility] 01/07/2021, 3:37 PM   Available via Epic secure chat 7am-7pm After these hours, please refer to coverage provider listed on [email or web address]

## 2021-01-08 DIAGNOSIS — Z9889 Other specified postprocedural states: Secondary | ICD-10-CM | POA: Diagnosis not present

## 2021-01-08 DIAGNOSIS — K219 Gastro-esophageal reflux disease without esophagitis: Secondary | ICD-10-CM | POA: Diagnosis not present

## 2021-01-08 DIAGNOSIS — M25562 Pain in left knee: Secondary | ICD-10-CM | POA: Diagnosis not present

## 2021-01-08 DIAGNOSIS — M6281 Muscle weakness (generalized): Secondary | ICD-10-CM | POA: Diagnosis not present

## 2021-01-08 DIAGNOSIS — Z515 Encounter for palliative care: Secondary | ICD-10-CM | POA: Diagnosis not present

## 2021-01-08 DIAGNOSIS — M4628 Osteomyelitis of vertebra, sacral and sacrococcygeal region: Secondary | ICD-10-CM | POA: Diagnosis not present

## 2021-01-08 DIAGNOSIS — I129 Hypertensive chronic kidney disease with stage 1 through stage 4 chronic kidney disease, or unspecified chronic kidney disease: Secondary | ICD-10-CM | POA: Diagnosis not present

## 2021-01-08 DIAGNOSIS — L89154 Pressure ulcer of sacral region, stage 4: Secondary | ICD-10-CM | POA: Diagnosis not present

## 2021-01-08 DIAGNOSIS — R404 Transient alteration of awareness: Secondary | ICD-10-CM | POA: Diagnosis not present

## 2021-01-08 DIAGNOSIS — I9589 Other hypotension: Secondary | ICD-10-CM | POA: Diagnosis not present

## 2021-01-08 DIAGNOSIS — E1065 Type 1 diabetes mellitus with hyperglycemia: Secondary | ICD-10-CM | POA: Diagnosis not present

## 2021-01-08 DIAGNOSIS — R4182 Altered mental status, unspecified: Secondary | ICD-10-CM | POA: Diagnosis not present

## 2021-01-08 DIAGNOSIS — J690 Pneumonitis due to inhalation of food and vomit: Secondary | ICD-10-CM | POA: Diagnosis not present

## 2021-01-08 DIAGNOSIS — Z79899 Other long term (current) drug therapy: Secondary | ICD-10-CM | POA: Diagnosis not present

## 2021-01-08 DIAGNOSIS — E1022 Type 1 diabetes mellitus with diabetic chronic kidney disease: Secondary | ICD-10-CM | POA: Diagnosis not present

## 2021-01-08 DIAGNOSIS — L98492 Non-pressure chronic ulcer of skin of other sites with fat layer exposed: Secondary | ICD-10-CM | POA: Diagnosis not present

## 2021-01-08 DIAGNOSIS — R809 Proteinuria, unspecified: Secondary | ICD-10-CM | POA: Diagnosis present

## 2021-01-08 DIAGNOSIS — N3946 Mixed incontinence: Secondary | ICD-10-CM | POA: Diagnosis not present

## 2021-01-08 DIAGNOSIS — R627 Adult failure to thrive: Secondary | ICD-10-CM | POA: Diagnosis present

## 2021-01-08 DIAGNOSIS — Z743 Need for continuous supervision: Secondary | ICD-10-CM | POA: Diagnosis not present

## 2021-01-08 DIAGNOSIS — R6510 Systemic inflammatory response syndrome (SIRS) of non-infectious origin without acute organ dysfunction: Secondary | ICD-10-CM | POA: Diagnosis not present

## 2021-01-08 DIAGNOSIS — M25551 Pain in right hip: Secondary | ICD-10-CM | POA: Diagnosis not present

## 2021-01-08 DIAGNOSIS — M2550 Pain in unspecified joint: Secondary | ICD-10-CM | POA: Diagnosis not present

## 2021-01-08 DIAGNOSIS — Z20822 Contact with and (suspected) exposure to covid-19: Secondary | ICD-10-CM | POA: Diagnosis not present

## 2021-01-08 DIAGNOSIS — I517 Cardiomegaly: Secondary | ICD-10-CM | POA: Diagnosis not present

## 2021-01-08 DIAGNOSIS — E860 Dehydration: Secondary | ICD-10-CM | POA: Diagnosis not present

## 2021-01-08 DIAGNOSIS — F339 Major depressive disorder, recurrent, unspecified: Secondary | ICD-10-CM | POA: Diagnosis not present

## 2021-01-08 DIAGNOSIS — D509 Iron deficiency anemia, unspecified: Secondary | ICD-10-CM | POA: Diagnosis not present

## 2021-01-08 DIAGNOSIS — Z7189 Other specified counseling: Secondary | ICD-10-CM | POA: Diagnosis not present

## 2021-01-08 DIAGNOSIS — Z89511 Acquired absence of right leg below knee: Secondary | ICD-10-CM | POA: Diagnosis not present

## 2021-01-08 DIAGNOSIS — N179 Acute kidney failure, unspecified: Secondary | ICD-10-CM | POA: Diagnosis not present

## 2021-01-08 DIAGNOSIS — R824 Acetonuria: Secondary | ICD-10-CM | POA: Diagnosis present

## 2021-01-08 DIAGNOSIS — I959 Hypotension, unspecified: Secondary | ICD-10-CM | POA: Diagnosis not present

## 2021-01-08 DIAGNOSIS — Z940 Kidney transplant status: Secondary | ICD-10-CM | POA: Diagnosis not present

## 2021-01-08 DIAGNOSIS — M25552 Pain in left hip: Secondary | ICD-10-CM | POA: Diagnosis not present

## 2021-01-08 DIAGNOSIS — R0902 Hypoxemia: Secondary | ICD-10-CM | POA: Diagnosis not present

## 2021-01-08 DIAGNOSIS — E1165 Type 2 diabetes mellitus with hyperglycemia: Secondary | ICD-10-CM | POA: Diagnosis not present

## 2021-01-08 DIAGNOSIS — I10 Essential (primary) hypertension: Secondary | ICD-10-CM | POA: Diagnosis not present

## 2021-01-08 DIAGNOSIS — R21 Rash and other nonspecific skin eruption: Secondary | ICD-10-CM | POA: Diagnosis not present

## 2021-01-08 DIAGNOSIS — R410 Disorientation, unspecified: Secondary | ICD-10-CM | POA: Diagnosis not present

## 2021-01-08 DIAGNOSIS — N1831 Chronic kidney disease, stage 3a: Secondary | ICD-10-CM | POA: Diagnosis not present

## 2021-01-08 DIAGNOSIS — D649 Anemia, unspecified: Secondary | ICD-10-CM | POA: Diagnosis not present

## 2021-01-08 DIAGNOSIS — A419 Sepsis, unspecified organism: Secondary | ICD-10-CM | POA: Diagnosis not present

## 2021-01-08 DIAGNOSIS — R279 Unspecified lack of coordination: Secondary | ICD-10-CM | POA: Diagnosis not present

## 2021-01-08 DIAGNOSIS — L89153 Pressure ulcer of sacral region, stage 3: Secondary | ICD-10-CM | POA: Diagnosis not present

## 2021-01-08 DIAGNOSIS — E440 Moderate protein-calorie malnutrition: Secondary | ICD-10-CM | POA: Diagnosis not present

## 2021-01-08 DIAGNOSIS — D631 Anemia in chronic kidney disease: Secondary | ICD-10-CM | POA: Diagnosis present

## 2021-01-08 DIAGNOSIS — E1059 Type 1 diabetes mellitus with other circulatory complications: Secondary | ICD-10-CM | POA: Diagnosis not present

## 2021-01-08 DIAGNOSIS — E785 Hyperlipidemia, unspecified: Secondary | ICD-10-CM | POA: Diagnosis not present

## 2021-01-08 DIAGNOSIS — E039 Hypothyroidism, unspecified: Secondary | ICD-10-CM | POA: Diagnosis not present

## 2021-01-08 DIAGNOSIS — R339 Retention of urine, unspecified: Secondary | ICD-10-CM | POA: Diagnosis not present

## 2021-01-08 DIAGNOSIS — R823 Hemoglobinuria: Secondary | ICD-10-CM | POA: Diagnosis present

## 2021-01-08 DIAGNOSIS — Z681 Body mass index (BMI) 19 or less, adult: Secondary | ICD-10-CM | POA: Diagnosis not present

## 2021-01-08 DIAGNOSIS — M25572 Pain in left ankle and joints of left foot: Secondary | ICD-10-CM | POA: Diagnosis not present

## 2021-01-08 DIAGNOSIS — K209 Esophagitis, unspecified without bleeding: Secondary | ICD-10-CM | POA: Diagnosis not present

## 2021-01-08 DIAGNOSIS — Z7401 Bed confinement status: Secondary | ICD-10-CM | POA: Diagnosis not present

## 2021-01-08 DIAGNOSIS — E1069 Type 1 diabetes mellitus with other specified complication: Secondary | ICD-10-CM | POA: Diagnosis not present

## 2021-01-08 DIAGNOSIS — R52 Pain, unspecified: Secondary | ICD-10-CM | POA: Diagnosis not present

## 2021-01-08 DIAGNOSIS — R109 Unspecified abdominal pain: Secondary | ICD-10-CM | POA: Diagnosis not present

## 2021-01-08 DIAGNOSIS — Z9483 Pancreas transplant status: Secondary | ICD-10-CM | POA: Diagnosis not present

## 2021-01-08 DIAGNOSIS — E8809 Other disorders of plasma-protein metabolism, not elsewhere classified: Secondary | ICD-10-CM | POA: Diagnosis present

## 2021-01-08 LAB — GLUCOSE, CAPILLARY
Glucose-Capillary: 126 mg/dL — ABNORMAL HIGH (ref 70–99)
Glucose-Capillary: 148 mg/dL — ABNORMAL HIGH (ref 70–99)
Glucose-Capillary: 183 mg/dL — ABNORMAL HIGH (ref 70–99)
Glucose-Capillary: 195 mg/dL — ABNORMAL HIGH (ref 70–99)
Glucose-Capillary: 224 mg/dL — ABNORMAL HIGH (ref 70–99)

## 2021-01-08 MED ORDER — HEPARIN SOD (PORK) LOCK FLUSH 100 UNIT/ML IV SOLN
250.0000 [IU] | INTRAVENOUS | Status: AC | PRN
  Administered 2021-01-08: 250 [IU]
  Filled 2021-01-08: qty 2.5

## 2021-01-08 MED ORDER — DOXYCYCLINE HYCLATE 100 MG PO CAPS: 100.0000 mg | ORAL_CAPSULE | Freq: Two times a day (BID) | ORAL | 0 refills | Status: AC

## 2021-01-08 MED ORDER — INSULIN GLARGINE 100 UNIT/ML ~~LOC~~ SOLN: 4.0000 [IU] | Freq: Every day | SUBCUTANEOUS | 0 refills | Status: DC

## 2021-01-08 MED ORDER — CEFTRIAXONE IV (FOR PTA / DISCHARGE USE ONLY): 2.0000 g | INTRAVENOUS | 0 refills | Status: AC

## 2021-01-08 NOTE — TOC Transition Note (Addendum)
Transition of Care [facility]) - CM/SW Discharge Note   Patient Details  Name: [name] MRN: [id number] Date of Birth: 01-21-1961  Transition of Care [facility]) CM/SW Contact:  Dessa Phi, RN Phone Number: 01/08/2021, 9:00 AM   Clinical Narrative:[facility] rep Debbie-received auth. MD aware-states stable for d/c. Awaiting d/c summary.covid neg on 1/11-facility agreeable to no new covid test needed. Will arrange PTAR once ready. 27:63R-E/V to [facility] rep Jackelyn Poling can accept-going to rm#[id number],nsg call report tel# [phone].PTAR called as a WILL CALL-per nsg -iv abx to be given prior d/c.No further CM needs.    Final next level of care: Skilled Nursing Facility Barriers to Discharge: No Barriers Identified   Patient Goals and CMS Choice Patient states their goals for this hospitalization and ongoing recovery are:: Rehab CMS Medicare.gov Compare Post Acute Care list provided to:: Patient Represenative (must comment) Choice offered to / list presented to : Spouse  Discharge Placement PASRR number recieved: 12/30/20            Patient chooses bed at: Other - please specify in the comment section below: ([facility]) Patient to be transferred to facility by: [place] Name of family member notified: Elissa Patient and family notified of of transfer: 01/08/21  Discharge Plan and Services   Discharge Planning Services: CM Consult Post Acute Care Choice: [place]                        Date [place]: 12/30/20 Time [place]: 1901    Social Determinants of Health (SDOH) Interventions     Readmission Risk Interventions No flowsheet data found.

## 2021-01-08 NOTE — Progress Notes (Signed)
Report called to [facility]. Awaiting IVT to flush and cap PICC for discharge. Will call PTAR when ready to be transported.

## 2021-01-08 NOTE — Discharge Summary (Signed)
Physician Discharge Summary  [name] AOZ:[id number] DOB: 01/02/1961 DOA: 12/07/2020  PCP: [name], MD  Admit date: 12/07/2020 Discharge date: 01/08/2021  Disposition:  SNF  Recommendations for Outpatient Follow-up:  1. Follow up with PCP in 1 week  Discharge Condition: Stable CODE STATUS: Full  Diet recommendation:  Diet Orders (From admission, onward)    Start     Ordered   01/01/21 1336  Diet Carb Modified Fluid consistency: Thin; Room service appropriate? Yes  Diet effective now       Question Answer Comment  Diet-HS Snack? Nothing   Calorie Level Medium 1600-2000   Fluid consistency: Thin   Room service appropriate? 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  Hospital course patient initially started on empiric vancomycin, cefepime, Flagyl. Due to AKI in setting of renal transplant we will switch to daptomycin instead of vancomycin and completed 7 days of therapy of Flagyl and daptomycin (12/13-12/19). She was then started on Zosyn (12/30-1/3) for presumed aspiration pneumonia (no fever, hypotension, chest x-ray with infiltrate). She has had prolonged hospitalization to above as well as necessary hydrotherapy for sacral decubitus ulcer.  Discharge Diagnoses:  Active Problems:   Anemia secondary to renal  failure   S/p cadaver renal transplant   Chronic kidney disease (CKD) stage G3a/A1, moderately decreased glomerular filtration rate (GFR) between 45-59 mL/min/1.73 square meter and albuminuria creatinine ratio less than 30 mg/g (HCC)   Hypertension   Acute kidney injury superimposed on CKD (HCC)   Type 1 diabetes mellitus with complication, with long term current use of insulin pump (HCC)   Pressure ulcer of BKA stump ([place])   Long-term use of immunosuppressant medication   Sepsis ([place])   Hypoalbuminemia   Hyponatremia   CAP (community acquired pneumonia)   Acute urinary retention   Acute metabolic encephalopathy   Sacral decubitus ulcer, stage IV (HCC)   Cellulitis   Hypotension   Malodorous urine   Erosive esophagitis   Hypothyroid   Depression   Type 1 diabetes mellitus with hyperlipidemia (HCC)   Unspecified severe protein-calorie malnutrition (HCC)   Failure to thrive in adult   Acute osteomyelitis of sacrum (HCC)   Malnutrition of moderate degree   Unstageable chronic decubitus ulcer, present on admission, with acute osteomyelitis of sacrum. -Completed 7-day course of vancomycin/daptomycin/Flagyl (12/13-12/19)initially on admission for presumed cellulitis.Remains afebrile, hemodynamically stable, previous CT showed no osteomyelitis, MRI (01/02/20) confirms acute osteomyelitis and bone is exposed on exam.Risk factor likely immobilization/bed bound status. Unable to get bone biopsy as not offered by general surgery or IR -Appreciate ID recommendations, IV ceftriaxone 2 g every 24 and doxycycline 100 mg p.o. twice daily, End date 02/12/2021 -PICC placed 01/02/2021, per nephrologist, Dr. Carolin Sicks and 1/6, who states given stability in creatinine and need for antibiotic therapy okay with line placement -  Continue wound dressing care as recommended by wound care nursing  Aspiration pneumonia, resolved -On room air. Completed 5-day course of IV Zosyn. Remains afebrile and  hemodynamically stable.  AKI on CKD stage III, status post renal transplant creatinine stable at baseline -Monitor urine output, avoid nephrotoxins, monitor BMP -Continue sodium bicarb -Continue Imuran and cyclosporine  Type 1 diabetes, status post pancreas transplant, poorly controlled recurrent hypoglycemia. -A1c 7.2 -Continue Lantus, SSI  Chronic macrocytic anemia, stable -History of B12 deficiency hemoglobin stable at baseline  HTN, stable -Continue Lopressor  Hypothyroidism, stable  -Continue Synthroid  Depression, stable -Continue Zoloft  Erosive esophagitis, stable -Continue PPI  Diastolic CHF, stable. -TTE 12/08/20. EF 09-62%, grade 1 diastolic dysfunction. Euvolemic.   HLD, stable -Continue statin    In agreement with assessment of the pressure ulcer as below:  Pressure Injury 12/10/20 Heel Left;Posterior Stage 2 -  Partial thickness loss of dermis presenting as a shallow open injury with a red, pink wound bed without slough. (Active)  12/10/20 1325  Location: Heel  Location Orientation: Left;Posterior  Staging: Stage 2 -  Partial thickness loss of dermis presenting as a shallow open injury with a red, pink wound bed without slough.  Wound Description (Comments):   Present on Admission: Yes     Pressure Injury 12/24/20 Sacrum Stage 4 - Full thickness tissue loss with exposed bone, tendon or muscle. (Active)  12/24/20   Location: Sacrum  Location Orientation:   Staging: Stage 4 - Full thickness tissue loss with exposed bone, tendon or muscle.  Wound Description (Comments):   Present on Admission: Yes    Nutrition Problem: Moderate Malnutrition Etiology: chronic illness (stage 3 CKD)  Discharge Instructions  Discharge Instructions    [facility] pharmacist to adjust dose for Vancomycin, Aminoglycosides and other anti-infective therapies as requested by physician.   Complete by: As directed    [facility] to provide  Cath Flo 86m   Complete by: As directed    Administer for PICC line occlusion and as ordered by physician for other access device issues.   Anaphylaxis Kit: Provided to treat any anaphylactic reaction to the medication being provided to the patient if First Dose or when requested by physician   Complete by: As directed    Epinephrine 126mml vial / amp: Administer 0.24m64m0.24ml70mubcutaneously once for moderate to severe anaphylaxis, nurse to call physician and pharmacy when reaction occurs and call 911 if needed for immediate care   Diphenhydramine 50mg59mIV vial: Administer 25-50mg 424mM PRN for first dose reaction, rash, itching, mild reaction, nurse to call physician and pharmacy when reaction occurs   Sodium Chloride 0.9% NS 500ml I32mdminister if needed for hypovolemic blood pressure drop or as ordered by physician after call to physician with anaphylactic reaction   Change dressing on IV access line weekly and PRN   Complete by: As directed    Diet - low sodium heart healthy   Complete by: As directed    Discharge instructions   Complete by: As directed    Please follow up with PCP in one week.  Please follow up with wound care.   Discharge wound care:   Complete by: As directed    Apply Aquacel packing to sacrum wound Q day, using swab to tuck underneath wound edges and fill space below skin level which surround the wound.  Apply Desitin to outer red wound edges, then cover with foam dressing.  (Change foam dressings Q 3 days or PRN soiling.)  Discharge wound care:   Complete by: As directed    Apply 1 sheet of Aquacel packing to sacrum wound Q day, using swab to tuck underneath wound edges and fill space below skin level which surround the wound.  Apply Desitin to outer red wound edges, then cover with foam dressing.  (Change foam dressings Q 3 days or PRN soiling.)   Flush IV access with Sodium Chloride 0.9% and Heparin 10 units/ml or 100 units/ml   Complete by: As directed    Home  infusion instructions - [facility]   Complete by: As directed    Instructions: Flush IV access with Sodium Chloride 0.9% and Heparin 10units/ml or 100units/ml   Change dressing on IV access line: Weekly and PRN   Instructions Cath Flo 65m: Administer for PICC Line occlusion and as ordered by physician for other access device   [facility] pharmacist to adjust dose for: Vancomycin, Aminoglycosides and other anti-infective therapies as requested by physician   Method of administration may be changed at the discretion of home infusion pharmacist based upon assessment of the patient and/or caregiver's ability to self-administer the medication ordered   Complete by: As directed      Allergies as of 01/08/2021   No Known Allergies     Medication List    STOP taking these medications   torsemide 20 MG tablet Commonly known as: DEMADEX   traMADol 50 MG tablet Commonly known as: ULTRAM     TAKE these medications   acetaminophen 325 MG tablet Commonly known as: TYLENOL Take 2 tablets (650 mg total) by mouth every 6 (six) hours as needed for mild pain (or Fever >/= 101). What changed: reasons to take this   azaTHIOprine 50 MG tablet Commonly known as: IMURAN Take 50 mg by mouth daily.   cefTRIAXone  IVPB Commonly known as: ROCEPHIN Inject 2 g into the vein daily. Indication:  Osteomyelitis First Dose: Yes Last Day of Therapy:  02/12/2021 Labs - Once weekly:  CBC/D and BMP, Labs - Every other week:  ESR and CRP Method of administration: IV Push Method of administration may be changed at the discretion of home infusion pharmacist based upon assessment of the patient and/or caregiver's ability to self-administer the medication ordered.   collagenase ointment Commonly known as: SANTYL Apply topically daily.   cyanocobalamin 100 MCG tablet Take 1 tablet (100 mcg total) by mouth daily.   cycloSPORINE 25 MG capsule Commonly known as: SANDIMMUNE Take 75 mg by  mouth 2 (two) times daily.   doxycycline 100 MG capsule Commonly known as: VIBRAMYCIN Take 1 capsule (100 mg total) by mouth 2 (two) times daily. Treat through 02/12/21   feeding supplement Liqd Take 237 mLs by mouth 2 (two) times daily between meals.   nutrition supplement (JUVEN) Pack Take 1 packet by mouth 2 (two) times daily between meals.   folic acid 1 MG tablet Commonly known as: FOLVITE Take 1 tablet (1 mg total) by mouth daily.   insulin glargine 100 UNIT/ML injection Commonly known as: LANTUS Inject 0.04 mLs (4 Units total) into the skin daily. What changed:   how much to take  when to take this   insulin lispro 100 UNIT/ML injection Commonly known as: HUMALOG Inject 2 Units into the skin 3 (three) times daily before meals.   levothyroxine 50 MCG tablet Commonly known as: SYNTHROID Take 1 tablet (50 mcg total) by mouth daily.   liver oil-zinc oxide 40 % ointment Commonly known as: DESITIN  Apply topically daily.   metoprolol tartrate 25 MG tablet Commonly known as: LOPRESSOR Take 12.5 mg by mouth 2 (two) times daily.   multivitamin with minerals Tabs tablet Take 1 tablet by mouth daily.   pantoprazole 40 MG tablet Commonly known as: PROTONIX Take 1 tablet (40 mg total) by mouth 2 (two) times daily.   polyethylene glycol 17 g packet Commonly known as: MIRALAX / GLYCOLAX Take 17 g by mouth daily.   rosuvastatin 5 MG tablet Commonly known as: CRESTOR Take 1 tablet (5 mg total) by mouth at bedtime.   senna-docusate 8.6-50 MG tablet Commonly known as: Senokot-S Take 2 tablets by mouth 2 (two) times daily as needed for mild constipation.   sertraline 25 MG tablet Commonly known as: ZOLOFT Take 1 tablet (25 mg total) by mouth daily.   sodium bicarbonate 650 MG tablet Take 1 tablet (650 mg total) by mouth 2 (two) times daily.     ASK your doctor about these medications   HYDROcodone-acetaminophen 5-325 MG tablet Commonly known as:  NORCO/VICODIN Take 1-2 tablets by mouth every 4 (four) hours as needed for up to 3 days for moderate pain. Ask about: Should I take this medication?            Discharge Care Instructions  (From admission, onward)         Start     Ordered   01/08/21 0000  Change dressing on IV access line weekly and PRN  (Home infusion instructions - [facility] )        01/08/21 0913   01/08/21 0000  Discharge wound care:       Comments: Apply 1 sheet of Aquacel packing to sacrum wound Q day, using swab to tuck underneath wound edges and fill space below skin level which surround the wound.  Apply Desitin to outer red wound edges, then cover with foam dressing.  (Change foam dressings Q 3 days or PRN soiling.)   01/08/21 0913   12/23/20 0000  Discharge wound care:       Comments: Apply Aquacel packing to sacrum wound Q day, using swab to tuck underneath wound edges and fill space below skin level which surround the wound.  Apply Desitin to outer red wound edges, then cover with foam dressing.  (Change foam dressings Q 3 days or PRN soiling.)   12/23/20 1141          Follow-up Information    [name], MD. Schedule an appointment as soon as possible for a visit in 1 week(s).   Specialty: Family Medicine Contact information: [place] [address] [phone]              No Known Allergies    Procedures/Studies: DG Tibia/Fibula Right  Result Date: 12/31/2020 CLINICAL DATA:  Amputation stump pain, bleeding, per [facility] wound EXAM: RIGHT TIBIA AND FIBULA - 2 VIEW COMPARISON:  None. FINDINGS: Status post below the knee amputation of the right tibia and fibula. No bony erosion or sclerosis. Soft tissue wound of the stump tip. Vascular calcinosis. The included knee joint is well preserved. IMPRESSION: Status post below the knee amputation of the right tibia and fibula. No bony erosion or sclerosis to suggest osteomyelitis. Soft tissue wound of  the stump tip. MRI is the test of choice for the evaluation of bone marrow edema and osteomyelitis if clinically suspected. Electronically Signed   By: Eddie Candle M.D.   On: 12/31/2020 16:41  MR PELVIS W WO CONTRAST  Result Date: 01/01/2021 CLINICAL DATA:  Chronic sacral decubitus ulcer EXAM: MRI PELVIS WITHOUT AND WITH CONTRAST TECHNIQUE: Multiplanar multisequence MR imaging of the pelvis was performed both before and after administration of intravenous contrast. CONTRAST:  28m GADAVIST GADOBUTROL 1 MMOL/ML IV SOLN COMPARISON:  CT 12/07/2020 FINDINGS: Sacral decubitus ulcer overlies the mid to distal aspect of the sacrum. Thin fluid and air collection contiguous with the ulcer base extend into the dorsal cortex of the sacrum. Bone marrow edema and confluent low T1 signal is present within the distal sacrum extending from S4 to the sacrococcygeal junction (series 8, images 20-24). Bone marrow edema extends from the S3 segment through the distal coccyx (series 4, images 19-21). Bilateral SI joints are intact. No SI joint effusion or erosive changes to suggest septic arthritis. No fracture or pelvic diastasis. There is susceptibility artifact related to right total hip arthroplasty hardware. No appreciable periprosthetic fluid collection within the limitations of the exam. Diffuse intramuscular edema throughout the visualized musculature of the pelvis compatible with a nonspecific myositis. No intramuscular fluid collection. Diffuse anasarca. Transplant kidney is seen within the low pelvis with parenchymal enhancement on postcontrast sequences. Foley catheter is present within the bladder lumen. IMPRESSION: 1. Sacral decubitus ulcer with acute osteomyelitis of the distal sacrum extending from S4 to the sacrococcygeal junction. 2. Bone marrow edema extends from the S3 segment through the distal coccyx, the peripheral extent of which may represent reactive osteitis versus early acute osteomyelitis. 3. Diffuse  intramuscular edema throughout the visualized musculature of the pelvis compatible with a nonspecific myositis. No intramuscular fluid collection. 4. Diffuse anasarca. These results will be called to the ordering clinician or representative by the Radiologist Assistant, and communication documented in the PACS or [employer] Electronically Signed   By: NDavina PokeD.O.   On: 01/01/2021 08:23   DG CHEST PORT 1 VIEW  Result Date: 12/25/2020 CLINICAL DATA:  Infected sacral decubitus ulcer. EXAM: PORTABLE CHEST 1 VIEW COMPARISON:  12/07/2020 FINDINGS: Poor inspiration with increased patchy and linear density at both lung bases, left greater than right. The remainder of the lungs are clear with normal vascularity. Normal sized heart. Left axillary surgical clips. Moderate left and mild right glenohumeral joint degenerative changes. IMPRESSION: Poor inspiration with bibasilar atelectasis and possible pneumonia, left greater than right. Electronically Signed   By: SClaudie ReveringM.D.   On: 12/25/2020 13:22   [place]KG SITE RITE  Result Date: 01/01/2021 If Site Rite image not attached, placement could not be confirmed due to current cardiac rhythm.      Discharge Exam: Vitals:   01/07/21 2014 01/08/21 0436  BP: 138/77 112/68  Pulse: 79 83  Resp: 16 16  Temp: 97.9 F (36.6 C) 97.8 F (36.6 C)  SpO2: 96% 94%    General: Pt is alert, awake, not in acute distress Cardiovascular: RRR, S1/S2 +, no edema Respiratory: CTA bilaterally, no wheezing, no rhonchi, no respiratory distress, no conversational dyspnea  Abdominal: Soft, NT, ND, bowel sounds + Extremities: no edema, no cyanosis Psych: Normal mood and affect, stable judgement and insight     The results of significant diagnostics from this hospitalization (including imaging, microbiology, ancillary and laboratory) are listed below for reference.     Microbiology: Recent Results (from the past 240 hour(s))  Resp Panel by RT-PCR (Flu  A&B, Covid) Nasopharyngeal Swab     Status: None   Collection Time: 01/06/21  5:24 PM   Specimen: Nasopharyngeal Swab; Nasopharyngeal(NP)  swabs in vial transport medium  Result Value Ref Range Status   SARS Coronavirus 2 by RT PCR NEGATIVE NEGATIVE Final    Comment: (NOTE) SARS-CoV-2 target nucleic acids are NOT DETECTED.  The SARS-CoV-2 RNA is generally detectable in upper respiratory specimens during the acute phase of infection. The lowest concentration of SARS-CoV-2 viral copies this assay can detect is 138 copies/mL. A negative result does not preclude SARS-Cov-2 infection and should not be used as the sole basis for treatment or other patient management decisions. A negative result may occur with  improper specimen collection/handling, submission of specimen other than nasopharyngeal swab, presence of viral mutation(s) within the areas targeted by this assay, and inadequate number of viral copies(<138 copies/mL). A negative result must be combined with clinical observations, patient history, and epidemiological information. The expected result is Negative.  Fact Sheet for Patients:  EntrepreneurPulse.com.au  Fact Sheet for Healthcare Providers:  IncredibleEmployment.be  This test is no t yet approved or cleared by the [place] FDA and  has been authorized for detection and/or diagnosis of SARS-CoV-2 by FDA under an Emergency Use Authorization (EUA). This EUA will remain  in effect (meaning this test can be used) for the duration of the COVID-19 declaration under Section 564(b)(1) of the Act, 21 U.S.C.section 360bbb-3(b)(1), unless the authorization is terminated  or revoked sooner.       Influenza A by PCR NEGATIVE NEGATIVE Final   Influenza B by PCR NEGATIVE NEGATIVE Final    Comment: (NOTE) The Xpert Xpress SARS-CoV-2/FLU/RSV plus assay is intended as an aid in the diagnosis of influenza from Nasopharyngeal swab specimens  and should not be used as a sole basis for treatment. Nasal washings and aspirates are unacceptable for Xpert Xpress SARS-CoV-2/FLU/RSV testing.  Fact Sheet for Patients: EntrepreneurPulse.com.au  Fact Sheet for Healthcare Providers: IncredibleEmployment.be  This test is not yet approved or cleared by the [place] FDA and has been authorized for detection and/or diagnosis of SARS-CoV-2 by FDA under an Emergency Use Authorization (EUA). This EUA will remain in effect (meaning this test can be used) for the duration of the COVID-19 declaration under Section 564(b)(1) of the Act, 21 U.S.C. section 360bbb-3(b)(1), unless the authorization is terminated or revoked.  Performed at [facility], [place] [address] [place], [place] [address]      Labs: BNP (last 3 results) No results for input(s): BNP in the last 8760 hours. Basic Metabolic Panel: No results for input(s): NA, K, CL, CO2, GLUCOSE, BUN, CREATININE, CALCIUM, MG, PHOS in the last 168 hours. Liver Function Tests: No results for input(s): AST, ALT, ALKPHOS, BILITOT, PROT, ALBUMIN in the last 168 hours. No results for input(s): LIPASE, AMYLASE in the last 168 hours. No results for input(s): AMMONIA in the last 168 hours. CBC: Recent Labs  Lab 01/03/21 1600  WBC 4.9  HGB 8.4*  HCT 26.5*  MCV 112.3*  PLT 321   Cardiac Enzymes: No results for input(s): CKTOTAL, CKMB, CKMBINDEX, TROPONINI in the last 168 hours. BNP: Invalid input(s): POCBNP CBG: Recent Labs  Lab 01/07/21 1618 01/07/21 2011 01/08/21 0000 01/08/21 0434 01/08/21 0744  GLUCAP 79 89 126* 183* 195*   D-Dimer No results for input(s): DDIMER in the last 72 hours. Hgb A1c No results for input(s): HGBA1C in the last 72 hours. Lipid Profile No results for input(s): CHOL, HDL, LDLCALC, TRIG, CHOLHDL, LDLDIRECT in the last 72 hours. Thyroid function studies No results for input(s): TSH, T4TOTAL,  T3FREE, THYROIDAB in the last 72 hours.  Invalid input(s): FREET3 Anemia work up No results for input(s): VITAMINB12, FOLATE, FERRITIN, TIBC, IRON, RETICCTPCT in the last 72 hours. Urinalysis    Component Value Date/Time   COLORURINE YELLOW 12/25/2020 1428   APPEARANCEUR CLOUDY (A) 12/25/2020 1428   LABSPEC 1.010 12/25/2020 1428   PHURINE 6.0 12/25/2020 1428   GLUCOSEU NEGATIVE 12/25/2020 1428   [place] 12/25/2020 1428   [place] 12/25/2020 [place] 12/25/2020 1428   PROTEINUR 100 (A) 12/25/2020 1428   NITRITE NEGATIVE 12/25/2020 1428   LEUKOCYTESUR LARGE (A) 12/25/2020 1428   Sepsis Labs Invalid input(s): PROCALCITONIN,  WBC,  LACTICIDVEN Microbiology Recent Results (from the past 240 hour(s))  Resp Panel by RT-PCR (Flu A&B, Covid) Nasopharyngeal Swab     Status: None   Collection Time: 01/06/21  5:24 PM   Specimen: Nasopharyngeal Swab; Nasopharyngeal(NP) swabs in vial transport medium  Result Value Ref Range Status   SARS Coronavirus 2 by RT PCR NEGATIVE NEGATIVE Final    Comment: (NOTE) SARS-CoV-2 target nucleic acids are NOT DETECTED.  The SARS-CoV-2 RNA is generally detectable in upper respiratory specimens during the acute phase of infection. The lowest concentration of SARS-CoV-2 viral copies this assay can detect is 138 copies/mL. A negative result does not preclude SARS-Cov-2 infection and should not be used as the sole basis for treatment or other patient management decisions. A negative result may occur with  improper specimen collection/handling, submission of specimen other than nasopharyngeal swab, presence of viral mutation(s) within the areas targeted by this assay, and inadequate number of viral copies(<138 copies/mL). A negative result must be combined with clinical observations, patient history, and epidemiological information. The expected result is Negative.  Fact Sheet for Patients:   EntrepreneurPulse.com.au  Fact Sheet for Healthcare Providers:  IncredibleEmployment.be  This test is no t yet approved or cleared by the [place] FDA and  has been authorized for detection and/or diagnosis of SARS-CoV-2 by FDA under an Emergency Use Authorization (EUA). This EUA will remain  in effect (meaning this test can be used) for the duration of the COVID-19 declaration under Section 564(b)(1) of the Act, 21 U.S.C.section 360bbb-3(b)(1), unless the authorization is terminated  or revoked sooner.       Influenza A by PCR NEGATIVE NEGATIVE Final   Influenza B by PCR NEGATIVE NEGATIVE Final    Comment: (NOTE) The Xpert Xpress SARS-CoV-2/FLU/RSV plus assay is intended as an aid in the diagnosis of influenza from Nasopharyngeal swab specimens and should not be used as a sole basis for treatment. Nasal washings and aspirates are unacceptable for Xpert Xpress SARS-CoV-2/FLU/RSV testing.  Fact Sheet for Patients: EntrepreneurPulse.com.au  Fact Sheet for Healthcare Providers: IncredibleEmployment.be  This test is not yet approved or cleared by the [place] FDA and has been authorized for detection and/or diagnosis of SARS-CoV-2 by FDA under an Emergency Use Authorization (EUA). This EUA will remain in effect (meaning this test can be used) for the duration of the COVID-19 declaration under Section 564(b)(1) of the Act, 21 U.S.C. section 360bbb-3(b)(1), unless the authorization is terminated or revoked.  Performed at [facility], [place] [address], [place], [place] [address]      Patient was seen and examined on the day of discharge and was found to be in stable condition. Time coordinating discharge: 25 minutes including assessment and coordination of care, as well as examination of the patient.   SIGNED:  Dessa Phi, DO [facility] 01/08/2021, 9:13 AM

## 2021-01-12 ENCOUNTER — Emergency Department (HOSPITAL_COMMUNITY)
Admission: EM | Admit: 2021-01-12 | Discharge: 2021-01-12 | Disposition: A | Discharge status: Home / Self Care | Payer: BC Managed Care – PPO | Attending: Emergency Medicine | Admitting: Emergency Medicine

## 2021-01-12 ENCOUNTER — Encounter (HOSPITAL_COMMUNITY): Payer: Self-pay | Admitting: Emergency Medicine

## 2021-01-12 ENCOUNTER — Emergency Department (HOSPITAL_COMMUNITY): Payer: BC Managed Care – PPO

## 2021-01-12 ENCOUNTER — Other Ambulatory Visit: Payer: Self-pay

## 2021-01-12 DIAGNOSIS — M25572 Pain in left ankle and joints of left foot: Secondary | ICD-10-CM | POA: Diagnosis not present

## 2021-01-12 DIAGNOSIS — R338 Other retention of urine: Secondary | ICD-10-CM

## 2021-01-12 DIAGNOSIS — R339 Retention of urine, unspecified: Secondary | ICD-10-CM | POA: Diagnosis not present

## 2021-01-12 DIAGNOSIS — Z79899 Other long term (current) drug therapy: Secondary | ICD-10-CM | POA: Diagnosis not present

## 2021-01-12 DIAGNOSIS — R739 Hyperglycemia, unspecified: Secondary | ICD-10-CM

## 2021-01-12 DIAGNOSIS — M25562 Pain in left knee: Secondary | ICD-10-CM | POA: Diagnosis not present

## 2021-01-12 DIAGNOSIS — E1065 Type 1 diabetes mellitus with hyperglycemia: Secondary | ICD-10-CM | POA: Diagnosis not present

## 2021-01-12 DIAGNOSIS — I129 Hypertensive chronic kidney disease with stage 1 through stage 4 chronic kidney disease, or unspecified chronic kidney disease: Secondary | ICD-10-CM | POA: Insufficient documentation

## 2021-01-12 DIAGNOSIS — Z9889 Other specified postprocedural states: Secondary | ICD-10-CM | POA: Diagnosis not present

## 2021-01-12 DIAGNOSIS — E039 Hypothyroidism, unspecified: Secondary | ICD-10-CM | POA: Insufficient documentation

## 2021-01-12 DIAGNOSIS — M25552 Pain in left hip: Secondary | ICD-10-CM | POA: Diagnosis not present

## 2021-01-12 DIAGNOSIS — R109 Unspecified abdominal pain: Secondary | ICD-10-CM | POA: Insufficient documentation

## 2021-01-12 DIAGNOSIS — M25551 Pain in right hip: Secondary | ICD-10-CM | POA: Diagnosis not present

## 2021-01-12 DIAGNOSIS — N1831 Chronic kidney disease, stage 3a: Secondary | ICD-10-CM | POA: Diagnosis not present

## 2021-01-12 LAB — URINALYSIS, ROUTINE W REFLEX MICROSCOPIC
Bacteria, UA: NONE SEEN
Bilirubin Urine: NEGATIVE
Glucose, UA: >=500 mg/dL — AB
Ketones, ur: 5 mg/dL — AB
Nitrite: NEGATIVE
Protein, ur: 30 mg/dL — AB
Specific Gravity, Urine: 1.014 (ref 1.005–1.030)
WBC, UA: >50 WBC/hpf — ABNORMAL HIGH (ref 0–5)
pH: 6 (ref 5.0–8.0)

## 2021-01-12 LAB — CBC WITH DIFFERENTIAL/PLATELET
Abs Immature Granulocytes: 0.03 10*3/uL (ref 0.00–0.07)
Basophils Absolute: 0 10*3/uL (ref 0.0–0.1)
Basophils Relative: 1 %
Eosinophils Absolute: 0.1 10*3/uL (ref 0.0–0.5)
Eosinophils Relative: 1 %
HCT: 28.5 % — ABNORMAL LOW (ref 36.0–46.0)
Hemoglobin: 9.3 g/dL — ABNORMAL LOW (ref 12.0–15.0)
Immature Granulocytes: 0 %
Lymphocytes Relative: 24 %
Lymphs Abs: 1.8 10*3/uL (ref 0.7–4.0)
MCH: 35.9 pg — ABNORMAL HIGH (ref 26.0–34.0)
MCHC: 32.6 g/dL (ref 30.0–36.0)
MCV: 110 fL — ABNORMAL HIGH (ref 80.0–100.0)
Monocytes Absolute: 0.8 10*3/uL (ref 0.1–1.0)
Monocytes Relative: 12 %
Neutro Abs: 4.5 10*3/uL (ref 1.7–7.7)
Neutrophils Relative %: 62 %
Platelets: 350 10*3/uL (ref 150–400)
RBC: 2.59 MIL/uL — ABNORMAL LOW (ref 3.87–5.11)
RDW: 21.4 % — ABNORMAL HIGH (ref 11.5–15.5)
WBC: 7.2 10*3/uL (ref 4.0–10.5)
nRBC: 0 % (ref 0.0–0.2)

## 2021-01-12 LAB — CBG MONITORING, ED
Glucose-Capillary: 441 mg/dL — ABNORMAL HIGH (ref 70–99)
Glucose-Capillary: 382 mg/dL — ABNORMAL HIGH (ref 70–99)
Glucose-Capillary: 362 mg/dL — ABNORMAL HIGH (ref 70–99)
Glucose-Capillary: 343 mg/dL — ABNORMAL HIGH (ref 70–99)

## 2021-01-12 LAB — BASIC METABOLIC PANEL
Anion gap: 11 (ref 5–15)
BUN: 70 mg/dL — ABNORMAL HIGH (ref 8–23)
CO2: 25 mmol/L (ref 22–32)
Calcium: 10.3 mg/dL (ref 8.9–10.3)
Chloride: 92 mmol/L — ABNORMAL LOW (ref 98–111)
Creatinine, Ser: 1.14 mg/dL — ABNORMAL HIGH (ref 0.44–1.00)
GFR, Estimated: 55 mL/min — ABNORMAL LOW (ref >60)
Glucose, Bld: 421 mg/dL — ABNORMAL HIGH (ref 70–99)
Potassium: 4.2 mmol/L (ref 3.5–5.1)
Sodium: 128 mmol/L — ABNORMAL LOW (ref 135–145)

## 2021-01-12 LAB — BETA-HYDROXYBUTYRIC ACID: Beta-Hydroxybutyric Acid: 0.09 mmol/L (ref 0.05–0.27)

## 2021-01-12 LAB — MAGNESIUM: Magnesium: 1.7 mg/dL (ref 1.7–2.4)

## 2021-01-12 MED ORDER — INSULIN ASPART 100 UNIT/ML ~~LOC~~ SOLN
10.0000 [IU] | Freq: Once | SUBCUTANEOUS | Status: AC
  Administered 2021-01-12: 10 [IU] via SUBCUTANEOUS
  Filled 2021-01-12: qty 1

## 2021-01-12 MED ORDER — SODIUM CHLORIDE 0.9 % IV BOLUS
1000.0000 mL | Freq: Once | INTRAVENOUS | Status: AC
Start: 2021-01-12 — End: 2021-01-12
  Administered 2021-01-12: 1000 mL via INTRAVENOUS

## 2021-01-12 NOTE — Clinical Social Work Note (Addendum)
Transition of Care [facility]) - Emergency Department Mini Assessment   Patient Details  Name: [name] MRN: [id number] Date of Birth: July 29, 1961  Transition of Care [facility]) CM/SW Contact:    Iona Beard, [place] Phone Number: 01/12/2021, 4:29 PM   Clinical Narrative: TOC consulted due to pts husband wanting pt to go home rather than back to [facility] and needing [facility] RN and IV home infusions. TOC reached out to pts husband Sahar Ryback to update that due to pts commercial insurance plan pt may have a fee for [facility] RN and there may be some staffing difficulties. TOC also informed pts husband that it may take time for infusions to get set up. Pts husband agreeable to pt returning to [facility] while the process of getting the home IV infusions is started. TOC reached out to [place] with [facility] who is unable to accept referral due to RN staffing. Pam with [facility] is accepting the referral and will reach out to [place] the Education officer, museum at [place] to complete the process. TOC updated pts husband that he can contact SW at [facility] for updates about the IV infusions being set up. TOC signing off.   ED Mini Assessment: What brought you to the Emergency Department? : Hyperglycemia  Barriers to Discharge: ED Barriers Resolved  Barrier interventions: Referral for IV home insfusion  Means of departure: Ambulance  Interventions which prevented an admission or readmission: [place] or Services    Patient Contact and Communications Key Contact 1: Pam ([facility]) Key Contact 2: Corey ([place] Date: 01/12/21,          Patient states their goals for this hospitalization and ongoing recovery are:: Return to [facility].gov Compare Post Acute Care list provided to:: Patient Represenative (must comment) Choice offered to / list presented to : Spouse  Admission diagnosis:  EMS Patient Active Problem List   Diagnosis Date Noted  .  Malnutrition of moderate degree 01/07/2021  . Acute osteomyelitis of sacrum ([place])   . Failure to thrive in adult   . Unspecified severe protein-calorie malnutrition ([place]) 12/10/2020  . Acute metabolic encephalopathy 69/48/5462  . Sacral decubitus ulcer, stage IV ([place]) 12/08/2020  . Cellulitis 12/08/2020  . Hypotension 12/08/2020  . Malodorous urine 12/08/2020  . Erosive esophagitis 12/08/2020  . Hypothyroid 12/08/2020  . Depression 12/08/2020  . Type 1 diabetes mellitus with hyperlipidemia ([place]) 12/08/2020  . Sepsis ([place]) 12/07/2020  . Hypoalbuminemia 12/07/2020  . Hyponatremia 12/07/2020  . CAP (community acquired pneumonia) 12/07/2020  . Acute urinary retention 12/07/2020  . Coffee ground emesis   . Gastroesophageal reflux disease with esophagitis and hemorrhage   . Long-term use of immunosuppressant medication   . GI bleed 11/08/2020  . AKI (acute kidney injury) ([place]) 10/29/2020  . Gangrene of right foot ([place])   . Subacute osteomyelitis of right foot ([place])   . Type 1 diabetes mellitus with complication, with long term current use of insulin pump ([place]) 09/27/2020  . Pressure ulcer of BKA stump ([place]) 09/27/2020  . Diabetic ketoacidosis ([place]) 09/26/2020  . Acute kidney injury superimposed on CKD ([place]) 09/26/2020  . Cellulitis and abscess of right leg 09/26/2020  . 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 S/p cadaver renal transplant 05/14/2020  . Chronic kidney disease (CKD) stage G3a/A1, moderately decreased glomerular filtration rate (GFR) between 45-59 mL/min/1.73 square meter and albuminuria creatinine ratio less than 30 mg/g (HCC) 05/14/2020  . Hypertension 05/14/2020   PCP:  Caprice Renshaw, MD Pharmacy:   [id number] [place], [place] [address] [place] [address] Phone: [phone] Fax: [phone]

## 2021-01-12 NOTE — ED Triage Notes (Signed)
Pt arrived by [facility] for hyperglycemia. Pt has had blood sugars ranging from 560 to reading "HI" since 6 am. Pt caregiver states they gave 24 units of insulin.

## 2021-01-12 NOTE — ED Provider Notes (Addendum)
[facility] EMERGENCY DEPARTMENT Provider Note   CSN: [id number] Arrival date & time: 01/12/21  1040     History Chief Complaint  Patient presents with  . Hyperglycemia    [name] is a 60 y.o. female with PMH significant for insulin-dependent type 1 diabetes s/p right-sided BKA, CKD s/p renal transplant, encephalopathy, and sacral pressure ulcer who presents to the ED via EMS from [facility] after she was found to be hyperglycemic greater than 560 this morning.  She was given 24 units insulin subcu and then sent here via EMS.  History is obtained by EMS reports that she was mentating appropriately.  On my examination, patient reports that she does not feel particularly well.  Last evening she states that she must of been sleepwalking and pulled out her Foley catheter.  She states that she has required a catheter ever since she sustained her BKA.  She is now expressing some lower abdominal discomfort and states that she needs to void.  She also tells me that she fell and landed on her left knee.  There is a deformity noted to her left ankle, but she states that this is chronic.  Her primary complaint however is her sacral decubitus ulcer.  I reviewed patient's medical record and she was recently admitted for sepsis in the context of her stage III sacral decubitus ulcer.  She also required placement of Foley catheter at that time for acute urinary retention.   I reviewed her records and recent MRI 01/02/2020 actually confirmed acute osteomyelitis involving her sacral ulcer.  Plan at discharge is for ongoing IV ceftriaxone and doxycycline until 02/12/2021.  I called [facility] in [place], [place] and spoke with her RN who received story from overnight staff this morning that did not mention a fall. She confirms that patient has been receiving her antibiotics for her sacral osteomyelitis.  Their primary concern was her elevated blood sugars.   HPI     Past Medical History:  Diagnosis  Date  . Anemia of chronic renal failure   . Diabetes mellitus without complication [facility])     Patient Active Problem List   Diagnosis Date Noted  . Malnutrition of moderate degree 01/07/2021  . Acute osteomyelitis of sacrum ([place])   . Failure to thrive in adult   . Unspecified severe protein-calorie malnutrition ([place]) 12/10/2020  . Acute metabolic encephalopathy 40/98/1191  . Sacral decubitus ulcer, stage IV ([place]) 12/08/2020  . Cellulitis 12/08/2020  . Hypotension 12/08/2020  . Malodorous urine 12/08/2020  . Erosive esophagitis 12/08/2020  . Hypothyroid 12/08/2020  . Depression 12/08/2020  . Type 1 diabetes mellitus with hyperlipidemia ([place]) 12/08/2020  . Sepsis ([place]) 12/07/2020  . Hypoalbuminemia 12/07/2020  . Hyponatremia 12/07/2020  . CAP (community acquired pneumonia) 12/07/2020  . Acute urinary retention 12/07/2020  . Coffee ground emesis   . Gastroesophageal reflux disease with esophagitis and hemorrhage   . Long-term use of immunosuppressant medication   . GI bleed 11/08/2020  . AKI (acute kidney injury) ([place]) 10/29/2020  . Gangrene of right foot ([place])   . Subacute osteomyelitis of right foot ([place])   . Type 1 diabetes mellitus with complication, with long term current use of insulin pump ([place]) 09/27/2020  . Pressure ulcer of BKA stump ([place]) 09/27/2020  . Diabetic ketoacidosis ([place]) 09/26/2020  . Acute kidney injury superimposed on CKD ([place]) 09/26/2020  . Cellulitis and abscess of right leg 09/26/2020  . 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 S/p cadaver renal transplant 05/14/2020  .  Chronic kidney disease (CKD) stage G3a/A1, moderately decreased glomerular filtration rate (GFR) between 45-59 mL/min/1.73 square meter and albuminuria creatinine ratio less than 30 mg/g (HCC) 05/14/2020  . Hypertension 05/14/2020    Past Surgical History:  Procedure Laterality Date  . AMPUTATION Right 10/01/2020   Procedure: RIGHT BELOW KNEE AMPUTATION;  Surgeon: Newt Minion, MD;  Location: [facility];  Service: Orthopedics;  Laterality: Right;  . BLADDER SURGERY    . ESOPHAGOGASTRODUODENOSCOPY (EGD) WITH PROPOFOL N/A 11/10/2020   Procedure: ESOPHAGOGASTRODUODENOSCOPY (EGD) WITH PROPOFOL;  Surgeon: Irene Shipper, MD;  Location: [facility];  Service: Endoscopy;  Laterality: N/A;  . KIDNEY TRANSPLANT    . LEG SURGERY       OB History   No obstetric history on file.     Family History  Problem Relation Age of Onset  . Hypertension Mother   . Hypertension Father     Social History   Tobacco Use  . Smoking status: Never Smoker  . Smokeless tobacco: Never Used  Vaping Use  . Vaping Use: Never used  Substance Use Topics  . Alcohol use: Never  . Drug use: Never    Home Medications Prior to Admission medications   Medication Sig Start Date End Date Taking? Authorizing Provider  acetaminophen (TYLENOL) 325 MG tablet Take 2 tablets (650 mg total) by mouth every 6 (six) hours as needed for mild pain (or Fever >/= 101). Patient taking differently: Take 650 mg by mouth every 6 (six) hours as needed for mild pain or fever. 10/06/20   Nita Sells, MD  azaTHIOprine (IMURAN) 50 MG tablet Take 50 mg by mouth daily. 08/23/20   [provider]  cefTRIAXone (ROCEPHIN) IVPB Inject 2 g into the vein daily. Indication:  Osteomyelitis First Dose: Yes Last Day of Therapy:  02/12/2021 Labs - Once weekly:  CBC/D and BMP, Labs - Every other week:  ESR and CRP Method of administration: IV Push Method of administration may be changed at the discretion of home infusion pharmacist based upon assessment of the patient and/or caregiver's ability to self-administer the medication ordered. 01/08/21 02/19/21  Dessa Phi, DO  collagenase (SANTYL) ointment Apply topically daily. 11/08/20   Swayze, Ava, DO  cycloSPORINE (SANDIMMUNE) 25 MG capsule Take 75 mg by mouth 2 (two) times daily.    [provider]  doxycycline (VIBRAMYCIN) 100 MG capsule Take 1  capsule (100 mg total) by mouth 2 (two) times daily. Treat through 02/12/21 01/08/21 02/12/21  Dessa Phi, DO  feeding supplement (ENSURE ENLIVE / ENSURE PLUS) LIQD Take 237 mLs by mouth 2 (two) times daily between meals. 12/23/20   Hosie Poisson, MD  folic acid (FOLVITE) 1 MG tablet Take 1 tablet (1 mg total) by mouth daily. 12/24/20   Hosie Poisson, MD  insulin glargine (LANTUS) 100 UNIT/ML injection Inject 0.04 mLs (4 Units total) into the skin daily. 01/08/21   Dessa Phi, DO  insulin lispro (HUMALOG) 100 UNIT/ML injection Inject 2 Units into the skin 3 (three) times daily before meals.    [provider]  levothyroxine (SYNTHROID) 50 MCG tablet Take 1 tablet (50 mcg total) by mouth daily. 12/23/20   Hosie Poisson, MD  liver oil-zinc oxide (DESITIN) 40 % ointment Apply topically daily. 12/23/20   Hosie Poisson, MD  metoprolol tartrate (LOPRESSOR) 25 MG tablet Take 12.5 mg by mouth 2 (two) times daily.  09/15/20   [provider]  Multiple Vitamin (MULTIVITAMIN WITH MINERALS) TABS tablet Take 1 tablet by mouth daily. 12/24/20  Hosie Poisson, MD  nutrition supplement, JUVEN, (JUVEN) PACK Take 1 packet by mouth 2 (two) times daily between meals. 12/23/20   Hosie Poisson, MD  pantoprazole (PROTONIX) 40 MG tablet Take 1 tablet (40 mg total) by mouth 2 (two) times daily. 11/12/20   Barb Merino, MD  polyethylene glycol (MIRALAX / GLYCOLAX) 17 g packet Take 17 g by mouth daily. 12/24/20   Hosie Poisson, MD  rosuvastatin (CRESTOR) 5 MG tablet Take 1 tablet (5 mg total) by mouth at bedtime. 11/07/20   Swayze, Ava, DO  senna-docusate (SENOKOT-S) 8.6-50 MG tablet Take 2 tablets by mouth 2 (two) times daily as needed for mild constipation. 12/23/20   Hosie Poisson, MD  sertraline (ZOLOFT) 25 MG tablet Take 1 tablet (25 mg total) by mouth daily. 10/06/20   Nita Sells, MD  sodium bicarbonate 650 MG tablet Take 1 tablet (650 mg total) by mouth 2 (two) times daily. 10/06/20    Nita Sells, MD  vitamin B-12 100 MCG tablet Take 1 tablet (100 mcg total) by mouth daily. 12/24/20   Hosie Poisson, MD    Allergies    Patient has no known allergies.  Review of Systems   Review of Systems  All other systems reviewed and are negative.   Physical Exam Updated Vital Signs BP (!) 149/73   Pulse 78   Temp 97.9 F (36.6 C) (Oral)   Resp 16   Ht '5\' 4"'  (1.626 m)   Wt 50 kg   SpO2 100%   BMI 18.92 kg/m   Physical Exam Vitals and nursing note reviewed. Exam conducted with a chaperone present.  Constitutional:      General: She is not in acute distress.    Appearance: She is not toxic-appearing.  HENT:     Head: Normocephalic and atraumatic.  Eyes:     General: No scleral icterus.    Conjunctiva/sclera: Conjunctivae normal.  Cardiovascular:     Rate and Rhythm: Normal rate and regular rhythm.     Pulses: Normal pulses.  Pulmonary:     Effort: Pulmonary effort is normal. No respiratory distress.     Breath sounds: Normal breath sounds. No wheezing or rales.  Abdominal:     General: There is distension.     Tenderness: There is abdominal tenderness. There is no guarding.     Comments: Patient with lower abdominal distention over area of bladder.  Firm, mildly TTP.  No other areas of tenderness.  No overlying skin changes.  Genitourinary:    Comments: Sacral ulcer noted underneath dressing.  No significant cellulitis or purulent drainage.  Largely unchanged when compared to pictures noted in chart from prior admission. Musculoskeletal:     Comments: Right leg: S/p BKA. Left leg: Mild superficial abrasion over left knee.  ROM and strength limited.  Left foot pointing outward.  Mild pedal edema.  Warm.  Capillary refill less than 2 seconds.  Sensation grossly intact.  ROM limited.  Skin:    General: Skin is dry.  Neurological:     Mental Status: She is alert.     GCS: GCS eye subscore is 4. GCS verbal subscore is 5. GCS motor subscore is 6.   Psychiatric:        Mood and Affect: Mood normal.        Behavior: Behavior normal.        Thought Content: Thought content normal.     ED Results / Procedures / Treatments   Labs (all labs ordered are listed, but  only abnormal results are displayed) Labs Reviewed  BASIC METABOLIC PANEL - Abnormal; Notable for the following components:      Result Value   Sodium 128 (*)    Chloride 92 (*)    Glucose, Bld 421 (*)    BUN 70 (*)    Creatinine, Ser 1.14 (*)    GFR, Estimated 55 (*)    All other components within normal limits  URINALYSIS, ROUTINE W REFLEX MICROSCOPIC - Abnormal; Notable for the following components:   APPearance HAZY (*)    Glucose, UA >=500 (*)    Hgb urine dipstick MODERATE (*)    Ketones, ur 5 (*)    Protein, ur 30 (*)    Leukocytes,Ua MODERATE (*)    WBC, UA >50 (*)    All other components within normal limits  CBC WITH DIFFERENTIAL/PLATELET - Abnormal; Notable for the following components:   RBC 2.59 (*)    Hemoglobin 9.3 (*)    HCT 28.5 (*)    MCV 110.0 (*)    MCH 35.9 (*)    RDW 21.4 (*)    All other components within normal limits  CBG MONITORING, ED - Abnormal; Notable for the following components:   Glucose-Capillary 441 (*)    All other components within normal limits  CBG MONITORING, ED - Abnormal; Notable for the following components:   Glucose-Capillary 382 (*)    All other components within normal limits  CBG MONITORING, ED - Abnormal; Notable for the following components:   Glucose-Capillary 362 (*)    All other components within normal limits  URINE CULTURE  BETA-HYDROXYBUTYRIC ACID  MAGNESIUM    EKG None  Radiology DG Ankle Complete Left  Result Date: 01/12/2021 CLINICAL DATA:  Pain following fall EXAM: LEFT ANKLE COMPLETE - 3+ VIEW COMPARISON:  None. FINDINGS: Frontal, oblique, and lateral views were obtained. Bones are diffusely osteoporotic. There is evidence of an old fracture of the calcaneus with bony remodeling  posteriorly. There is evidence of prior fractures of the distal tibia and fibula with remodeling. There is ankylosis at the distal tibiofibular syndesmosis. No acute fracture or joint effusion is appreciable. There is joint space narrowing medially and lateral in the ankle mortise region. Ankle mortise appears grossly intact. Postoperative clips noted in the posterior hindfoot and ankle regions. IMPRESSION: Osteoporosis. Areas of prior trauma involving the distal tibia, distal fibula, and posterior calcaneus with remodeling. No acute appearing fracture evident. Moderate osteoarthritic change. There are surgical clips in the posterior ankle and hindfoot region. Electronically Signed   By: Lowella Grip III M.D.   On: 01/12/2021 15:09   DG Knee Complete 4 Views Left  Result Date: 01/12/2021 CLINICAL DATA:  Pain following fall EXAM: LEFT KNEE - COMPLETE 4+ VIEW COMPARISON:  None. FINDINGS: Frontal, lateral, and bilateral oblique views were obtained. There is no fracture, dislocation, or joint effusion. There is joint space narrowing medially and in the patellofemoral joint regions. No erosive change. There are foci of arterial vascular calcification at multiple sites. IMPRESSION: Narrowing medially in the patellofemoral joint regions. No fracture, dislocation, or joint effusion. Multilevel atherosclerotic arterial vascular calcification. Electronically Signed   By: Lowella Grip III M.D.   On: 01/12/2021 15:07   DG Hips Bilat W or Wo Pelvis 3-4 Views  Result Date: 01/12/2021 CLINICAL DATA:  Pain following fall EXAM: DG HIP (WITH OR WITHOUT PELVIS) 3-4V BILAT COMPARISON:  None. FINDINGS: Frontal pelvis as well as frontal and lateral views of each hip joint obtained. Bones are  diffusely osteoporotic. There is a total hip replacement on the right with prosthetic components well-seated. No acute fracture or dislocation. There is moderate narrowing of the left hip joint. There is multifocal arterial vascular  calcification. There are clips in the pelvis. IMPRESSION: Diffuse osteoporosis. Total hip replacement on the right with prosthetic components appearing well-seated. No acute fracture or dislocation. Moderate narrowing left hip joint. Postoperative changes in pelvis. Extensive arterial vascular calcification consistent with atherosclerosis noted. Electronically Signed   By: Lowella Grip III M.D.   On: 01/12/2021 15:10    Procedures Procedures (including critical care time)  Medications Ordered in ED Medications  sodium chloride 0.9 % bolus 1,000 mL (0 mLs Intravenous Stopped 01/12/21 1545)  insulin aspart (novoLOG) injection 10 Units (10 Units Subcutaneous Given 01/12/21 1517)    ED Course  I have reviewed the triage vital signs and the nursing notes.  Pertinent labs & imaging results that were available during my care of the patient were reviewed by me and considered in my medical decision making (see chart for details).    MDM Rules/Calculators/A&P                          Obtained bladder scan which was greater than 999 mL.  There was a total of urine 1200 mL that was drained.  While there was evidence of UTI on her UA, she is already being treated with multiple antibiotics at home for her osteomyelitis involving sacrum.  We will send urine for culture.  She is afebrile and without leukocytosis here in the ED.  Hyperglycemia without metabolic acidosis.  Beta-hydroxybutyric acid within normal limits.  Anemia with hemoglobin of 9.3 improved when compared to baseline.  While Foley catheter was still in place, it was changed given catheter obstruction and acute urinary retention.  BMP without worsening renal disease.  Given her fall and reported left leg pain and reluctance to participate in exam, will obtain plain films of hip, knee, and ankle while she is being treated with IV fluids and insulin here in the ED for her hyperglycemia.  Patient does admit that she never received PT after her  BKA and has not been ambulatory since before her surgery.  She also tells me that she doubts that she was actually asleep walking last night and instead likely just rolled out of bed.  Patient denies any head injury or LOC.  No headache or neck pain.  She is feeling improved.  I confirmed with the RN at [facility] that she is still actively receiving her antibiotics and treatment for her sacral ulcer.  It appears to be largely unchanged when compared to pictures in her chart from prior ED admission.  If x-rays negative, will continue to check CBG and once improved, reasonable for discharge home into care of her [place] skilled nursing facility.  As for her chronic deformity to left ankle, she states that it is corrected with a sneaker.   Plain films are personally reviewed which demonstrate diffuse osteoporosis and chronic changes, but no acute bony abnormalities.  Blood sugars have improved.  She is reasonable for discharge home.  No need to repeat MRI of sacrum given that osteomyelitis is already been established and she is receiving appropriate antibiotics at home.  The area appears to be well kept and clean.  Does not appear to be worse compared to photos reviewed from prior admission.  Patient is afebrile and without leukocytosis concerning for infection.  Discussed  case with Dr. Roderic Palau who agrees with assessment and plan.  ED return precautions discussed.  Patient voices understanding and is agreeable to the plan.    I spoke with Megan, LCSW.  They will work on arranging for home health and ultimate discharge from SNF given husband's dissatisfaction.  He understands that it is not to be done here in the ED today and that she can be discharged back to the SNF for continued antibiotics per PICC line until home health is established.   Final Clinical Impression(s) / ED Diagnoses Final diagnoses:  Hyperglycemia  Acute urinary retention    Rx / DC Orders ED Discharge Orders    None       Corena Herter, PA-C 01/12/21 1521    Corena Herter, PA-C 01/12/21 1627    Milton Ferguson, MD 01/17/21 1352

## 2021-01-12 NOTE — Discharge Instructions (Addendum)
Your high blood sugar and acute urinary retention improved here in the ED. We have exchanged your Foley catheter given that the previous one had clogged. Urine culture is obtained and pending.  Please continue with the antibiotics at home, as directed.  Please also continue with appropriate wound care for the sacral ulcer.  Continue to check her blood sugars regularly.    Given patient's reported left leg discomfort after falling out of bed last evening, plain films were obtained of ankle, knee, and hips bilaterally that was without any acute findings.  Please continue to monitor.  You will need to follow-up with your primary care provider regarding today's encounter and for ongoing evaluation and management.  Return to the ED or seek immediate medical attention should you experience any new or worsening symptoms.

## 2021-01-13 DIAGNOSIS — I10 Essential (primary) hypertension: Secondary | ICD-10-CM | POA: Diagnosis not present

## 2021-01-13 DIAGNOSIS — D649 Anemia, unspecified: Secondary | ICD-10-CM | POA: Diagnosis not present

## 2021-01-13 LAB — URINE CULTURE: Culture: 70000 — AB

## 2021-01-14 DIAGNOSIS — E440 Moderate protein-calorie malnutrition: Secondary | ICD-10-CM | POA: Diagnosis not present

## 2021-01-14 DIAGNOSIS — N3946 Mixed incontinence: Secondary | ICD-10-CM | POA: Diagnosis not present

## 2021-01-14 DIAGNOSIS — L89154 Pressure ulcer of sacral region, stage 4: Secondary | ICD-10-CM | POA: Diagnosis not present

## 2021-01-14 DIAGNOSIS — M6281 Muscle weakness (generalized): Secondary | ICD-10-CM | POA: Diagnosis not present

## 2021-01-15 DIAGNOSIS — E039 Hypothyroidism, unspecified: Secondary | ICD-10-CM | POA: Diagnosis not present

## 2021-01-15 DIAGNOSIS — N1831 Chronic kidney disease, stage 3a: Secondary | ICD-10-CM | POA: Diagnosis not present

## 2021-01-15 DIAGNOSIS — L89154 Pressure ulcer of sacral region, stage 4: Secondary | ICD-10-CM | POA: Diagnosis not present

## 2021-01-15 DIAGNOSIS — D509 Iron deficiency anemia, unspecified: Secondary | ICD-10-CM | POA: Diagnosis not present

## 2021-01-17 ENCOUNTER — Encounter (HOSPITAL_COMMUNITY): Payer: Self-pay

## 2021-01-17 ENCOUNTER — Other Ambulatory Visit: Payer: Self-pay

## 2021-01-17 ENCOUNTER — Emergency Department (HOSPITAL_COMMUNITY)
Admission: EM | Admit: 2021-01-17 | Discharge: 2021-01-17 | Disposition: A | Discharge status: Home / Self Care | Payer: BC Managed Care – PPO | Source: Home / Self Care | Attending: Emergency Medicine | Admitting: Emergency Medicine

## 2021-01-17 DIAGNOSIS — R339 Retention of urine, unspecified: Secondary | ICD-10-CM | POA: Diagnosis present

## 2021-01-17 DIAGNOSIS — E039 Hypothyroidism, unspecified: Secondary | ICD-10-CM | POA: Diagnosis not present

## 2021-01-17 DIAGNOSIS — R809 Proteinuria, unspecified: Secondary | ICD-10-CM | POA: Diagnosis present

## 2021-01-17 DIAGNOSIS — I6782 Cerebral ischemia: Secondary | ICD-10-CM | POA: Diagnosis not present

## 2021-01-17 DIAGNOSIS — Z743 Need for continuous supervision: Secondary | ICD-10-CM | POA: Diagnosis not present

## 2021-01-17 DIAGNOSIS — R5381 Other malaise: Secondary | ICD-10-CM | POA: Diagnosis not present

## 2021-01-17 DIAGNOSIS — F329 Major depressive disorder, single episode, unspecified: Secondary | ICD-10-CM | POA: Diagnosis not present

## 2021-01-17 DIAGNOSIS — D631 Anemia in chronic kidney disease: Secondary | ICD-10-CM | POA: Diagnosis present

## 2021-01-17 DIAGNOSIS — Z79899 Other long term (current) drug therapy: Secondary | ICD-10-CM | POA: Insufficient documentation

## 2021-01-17 DIAGNOSIS — R824 Acetonuria: Secondary | ICD-10-CM | POA: Diagnosis present

## 2021-01-17 DIAGNOSIS — N183 Chronic kidney disease, stage 3 unspecified: Secondary | ICD-10-CM | POA: Diagnosis not present

## 2021-01-17 DIAGNOSIS — Z940 Kidney transplant status: Secondary | ICD-10-CM | POA: Diagnosis not present

## 2021-01-17 DIAGNOSIS — R0902 Hypoxemia: Secondary | ICD-10-CM | POA: Diagnosis not present

## 2021-01-17 DIAGNOSIS — R279 Unspecified lack of coordination: Secondary | ICD-10-CM | POA: Diagnosis not present

## 2021-01-17 DIAGNOSIS — B49 Unspecified mycosis: Secondary | ICD-10-CM | POA: Diagnosis not present

## 2021-01-17 DIAGNOSIS — L98492 Non-pressure chronic ulcer of skin of other sites with fat layer exposed: Secondary | ICD-10-CM | POA: Insufficient documentation

## 2021-01-17 DIAGNOSIS — E1022 Type 1 diabetes mellitus with diabetic chronic kidney disease: Secondary | ICD-10-CM | POA: Diagnosis not present

## 2021-01-17 DIAGNOSIS — Z515 Encounter for palliative care: Secondary | ICD-10-CM | POA: Diagnosis not present

## 2021-01-17 DIAGNOSIS — I9589 Other hypotension: Secondary | ICD-10-CM | POA: Diagnosis not present

## 2021-01-17 DIAGNOSIS — R823 Hemoglobinuria: Secondary | ICD-10-CM | POA: Diagnosis present

## 2021-01-17 DIAGNOSIS — R627 Adult failure to thrive: Secondary | ICD-10-CM | POA: Diagnosis not present

## 2021-01-17 DIAGNOSIS — R4182 Altered mental status, unspecified: Secondary | ICD-10-CM | POA: Diagnosis not present

## 2021-01-17 DIAGNOSIS — E785 Hyperlipidemia, unspecified: Secondary | ICD-10-CM | POA: Diagnosis not present

## 2021-01-17 DIAGNOSIS — N1831 Chronic kidney disease, stage 3a: Secondary | ICD-10-CM | POA: Diagnosis not present

## 2021-01-17 DIAGNOSIS — I517 Cardiomegaly: Secondary | ICD-10-CM | POA: Diagnosis not present

## 2021-01-17 DIAGNOSIS — G319 Degenerative disease of nervous system, unspecified: Secondary | ICD-10-CM | POA: Diagnosis not present

## 2021-01-17 DIAGNOSIS — R6510 Systemic inflammatory response syndrome (SIRS) of non-infectious origin without acute organ dysfunction: Secondary | ICD-10-CM | POA: Diagnosis not present

## 2021-01-17 DIAGNOSIS — Z20822 Contact with and (suspected) exposure to covid-19: Secondary | ICD-10-CM | POA: Diagnosis not present

## 2021-01-17 DIAGNOSIS — I959 Hypotension, unspecified: Secondary | ICD-10-CM | POA: Diagnosis not present

## 2021-01-17 DIAGNOSIS — M4628 Osteomyelitis of vertebra, sacral and sacrococcygeal region: Secondary | ICD-10-CM | POA: Diagnosis not present

## 2021-01-17 DIAGNOSIS — E8809 Other disorders of plasma-protein metabolism, not elsewhere classified: Secondary | ICD-10-CM | POA: Diagnosis not present

## 2021-01-17 DIAGNOSIS — E860 Dehydration: Secondary | ICD-10-CM | POA: Diagnosis not present

## 2021-01-17 DIAGNOSIS — I6381 Other cerebral infarction due to occlusion or stenosis of small artery: Secondary | ICD-10-CM | POA: Diagnosis not present

## 2021-01-17 DIAGNOSIS — Z7189 Other specified counseling: Secondary | ICD-10-CM | POA: Diagnosis not present

## 2021-01-17 DIAGNOSIS — Z9483 Pancreas transplant status: Secondary | ICD-10-CM | POA: Diagnosis not present

## 2021-01-17 DIAGNOSIS — L89153 Pressure ulcer of sacral region, stage 3: Secondary | ICD-10-CM | POA: Diagnosis not present

## 2021-01-17 DIAGNOSIS — I129 Hypertensive chronic kidney disease with stage 1 through stage 4 chronic kidney disease, or unspecified chronic kidney disease: Secondary | ICD-10-CM | POA: Diagnosis not present

## 2021-01-17 DIAGNOSIS — Z794 Long term (current) use of insulin: Secondary | ICD-10-CM | POA: Insufficient documentation

## 2021-01-17 DIAGNOSIS — A419 Sepsis, unspecified organism: Secondary | ICD-10-CM | POA: Diagnosis not present

## 2021-01-17 DIAGNOSIS — K219 Gastro-esophageal reflux disease without esophagitis: Secondary | ICD-10-CM | POA: Diagnosis not present

## 2021-01-17 DIAGNOSIS — R21 Rash and other nonspecific skin eruption: Secondary | ICD-10-CM | POA: Diagnosis not present

## 2021-01-17 DIAGNOSIS — T8612 Kidney transplant failure: Secondary | ICD-10-CM | POA: Diagnosis not present

## 2021-01-17 DIAGNOSIS — N179 Acute kidney failure, unspecified: Secondary | ICD-10-CM | POA: Diagnosis not present

## 2021-01-17 DIAGNOSIS — E43 Unspecified severe protein-calorie malnutrition: Secondary | ICD-10-CM | POA: Diagnosis not present

## 2021-01-17 DIAGNOSIS — H748X3 Other specified disorders of middle ear and mastoid, bilateral: Secondary | ICD-10-CM | POA: Diagnosis not present

## 2021-01-17 DIAGNOSIS — L89154 Pressure ulcer of sacral region, stage 4: Secondary | ICD-10-CM | POA: Diagnosis not present

## 2021-01-17 DIAGNOSIS — U071 COVID-19: Secondary | ICD-10-CM | POA: Diagnosis not present

## 2021-01-17 DIAGNOSIS — Z681 Body mass index (BMI) 19 or less, adult: Secondary | ICD-10-CM | POA: Diagnosis not present

## 2021-01-17 DIAGNOSIS — E1165 Type 2 diabetes mellitus with hyperglycemia: Secondary | ICD-10-CM | POA: Insufficient documentation

## 2021-01-17 DIAGNOSIS — E1069 Type 1 diabetes mellitus with other specified complication: Secondary | ICD-10-CM | POA: Diagnosis not present

## 2021-01-17 DIAGNOSIS — L98422 Non-pressure chronic ulcer of back with fat layer exposed: Secondary | ICD-10-CM

## 2021-01-17 LAB — BASIC METABOLIC PANEL
Anion gap: 9 (ref 5–15)
BUN: 53 mg/dL — ABNORMAL HIGH (ref 8–23)
CO2: 24 mmol/L (ref 22–32)
Calcium: 9.8 mg/dL (ref 8.9–10.3)
Chloride: 100 mmol/L (ref 98–111)
Creatinine, Ser: 1.26 mg/dL — ABNORMAL HIGH (ref 0.44–1.00)
GFR, Estimated: 49 mL/min — ABNORMAL LOW (ref >60)
Glucose, Bld: 161 mg/dL — ABNORMAL HIGH (ref 70–99)
Potassium: 4.1 mmol/L (ref 3.5–5.1)
Sodium: 133 mmol/L — ABNORMAL LOW (ref 135–145)

## 2021-01-17 LAB — CBC WITH DIFFERENTIAL/PLATELET
Abs Immature Granulocytes: 0.03 10*3/uL (ref 0.00–0.07)
Basophils Absolute: 0 10*3/uL (ref 0.0–0.1)
Basophils Relative: 1 %
Eosinophils Absolute: 0.4 10*3/uL (ref 0.0–0.5)
Eosinophils Relative: 5 %
HCT: 28.2 % — ABNORMAL LOW (ref 36.0–46.0)
Hemoglobin: 8.9 g/dL — ABNORMAL LOW (ref 12.0–15.0)
Immature Granulocytes: 0 %
Lymphocytes Relative: 37 %
Lymphs Abs: 2.5 10*3/uL (ref 0.7–4.0)
MCH: 35.6 pg — ABNORMAL HIGH (ref 26.0–34.0)
MCHC: 31.6 g/dL (ref 30.0–36.0)
MCV: 112.8 fL — ABNORMAL HIGH (ref 80.0–100.0)
Monocytes Absolute: 1 10*3/uL (ref 0.1–1.0)
Monocytes Relative: 14 %
Neutro Abs: 2.9 10*3/uL (ref 1.7–7.7)
Neutrophils Relative %: 43 %
Platelets: 283 10*3/uL (ref 150–400)
RBC: 2.5 MIL/uL — ABNORMAL LOW (ref 3.87–5.11)
RDW: 21.1 % — ABNORMAL HIGH (ref 11.5–15.5)
WBC: 6.9 10*3/uL (ref 4.0–10.5)
nRBC: 0 % (ref 0.0–0.2)

## 2021-01-17 NOTE — ED Triage Notes (Signed)
Pt to er, pt states that she is here for some back pain, states that she thinks that it is her wound; however, her husband would like to have her foot evaluated, states that she had her other foot amputated October 7th.  States that she doesn't want to go back to [place].

## 2021-01-17 NOTE — ED Provider Notes (Signed)
[facility] EMERGENCY DEPARTMENT Provider Note  CSN: [id number] Arrival date & time: 01/17/21 1755    History Chief Complaint  Patient presents with  . Back Pain    HPI  [name] is a 60 y.o. female with history of multiple medical problems has been at [facility] recently for treatment of a sacral decubitus ulcer with osteomyelitis, getting IV rocephin and oral doxycyline. 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 She has had poorly controlled blood sugars recently. Per previous notes her husband has been dissatisfied with the care she is receiving at [facility] and has asked for her to come home, however there have been some barriers to getting home health established for that purpose due to staffing issues at the local agencies. She was in the ED for hyperglycemia about 5 days ago and ultimately discharged back to [facility] awaiting home health and other resources to be established to allow him to take her home. Patient has no specific complaints but reports some pain at her ulcer. She has not had a fever but she notes she has had kidney transplant and is on immunosuppressants. Staff at [facility] state the husband requested she be sent to the ED for elevated blood sugars and change in mental status. Staff states she has not be confused for them.    Past Medical History:  Diagnosis Date  . Anemia of chronic renal failure   . Diabetes mellitus without complication [facility])     Past Surgical History:  Procedure Laterality Date  . AMPUTATION Right 10/01/2020   Procedure: RIGHT BELOW KNEE AMPUTATION;  Surgeon: Newt Minion, MD;  Location: [place];  Service: Orthopedics;  Laterality: Right;  . BLADDER SURGERY    . ESOPHAGOGASTRODUODENOSCOPY (EGD) WITH PROPOFOL N/A 11/10/2020   Procedure: ESOPHAGOGASTRODUODENOSCOPY (EGD) WITH PROPOFOL;  Surgeon: Irene Shipper,  MD;  Location: [facility];  Service: Endoscopy;  Laterality: N/A;  . KIDNEY TRANSPLANT    . LEG SURGERY      Family History  Problem Relation Age of Onset  . Hypertension Mother   . Hypertension Father     Social History   Tobacco Use  . Smoking status: Never Smoker  . Smokeless tobacco: Never Used  Vaping Use  . Vaping Use: Never used  Substance Use Topics  . Alcohol use: Never  . Drug use: Never     Home Medications Prior to Admission medications   Medication Sig Start Date End Date Taking? Authorizing Provider  acetaminophen (TYLENOL) 325 MG tablet Take 2 tablets (650 mg total) by mouth every 6 (six) hours as needed for mild pain (or Fever >/= 101). Patient taking differently: Take 650 mg by mouth every 6 (six) hours as needed for mild pain or fever. 10/06/20   Nita Sells, MD  azaTHIOprine (IMURAN) 50 MG tablet Take 50 mg by mouth daily. 08/23/20   [provider]  cefTRIAXone (ROCEPHIN) IVPB Inject 2 g into the vein daily. Indication:  Osteomyelitis First Dose: Yes Last Day of Therapy:  02/12/2021 Labs - Once weekly:  CBC/D and BMP, Labs - Every other week:  ESR and CRP Method of administration: IV Push Method of administration may be changed at the discretion of home infusion pharmacist based upon assessment of the patient and/or caregiver's ability to self-administer the medication ordered. 01/08/21 02/19/21  Dessa Phi, DO  collagenase (SANTYL) ointment  Apply topically daily. 11/08/20   Swayze, Ava, DO  cycloSPORINE (SANDIMMUNE) 25 MG capsule Take 75 mg by mouth 2 (two) times daily.    [provider]  doxycycline (VIBRAMYCIN) 100 MG capsule Take 1 capsule (100 mg total) by mouth 2 (two) times daily. Treat through 02/12/21 01/08/21 02/12/21  Dessa Phi, DO  feeding supplement (ENSURE ENLIVE / ENSURE PLUS) LIQD Take 237 mLs by mouth 2 (two) times daily between meals. 12/23/20   Hosie Poisson, MD  folic acid (FOLVITE) 1 MG tablet Take 1  tablet (1 mg total) by mouth daily. 12/24/20   Hosie Poisson, MD  insulin glargine (LANTUS) 100 UNIT/ML injection Inject 0.04 mLs (4 Units total) into the skin daily. 01/08/21   Dessa Phi, DO  insulin lispro (HUMALOG) 100 UNIT/ML injection Inject 2 Units into the skin 3 (three) times daily before meals.    [provider]  levothyroxine (SYNTHROID) 50 MCG tablet Take 1 tablet (50 mcg total) by mouth daily. 12/23/20   Hosie Poisson, MD  liver oil-zinc oxide (DESITIN) 40 % ointment Apply topically daily. 12/23/20   Hosie Poisson, MD  metoprolol tartrate (LOPRESSOR) 25 MG tablet Take 12.5 mg by mouth 2 (two) times daily.  09/15/20   [provider]  Multiple Vitamin (MULTIVITAMIN WITH MINERALS) TABS tablet Take 1 tablet by mouth daily. 12/24/20   Hosie Poisson, MD  nutrition supplement, JUVEN, (JUVEN) PACK Take 1 packet by mouth 2 (two) times daily between meals. 12/23/20   Hosie Poisson, MD  pantoprazole (PROTONIX) 40 MG tablet Take 1 tablet (40 mg total) by mouth 2 (two) times daily. 11/12/20   Barb Merino, MD  polyethylene glycol (MIRALAX / GLYCOLAX) 17 g packet Take 17 g by mouth daily. 12/24/20   Hosie Poisson, MD  rosuvastatin (CRESTOR) 5 MG tablet Take 1 tablet (5 mg total) by mouth at bedtime. 11/07/20   Swayze, Ava, DO  senna-docusate (SENOKOT-S) 8.6-50 MG tablet Take 2 tablets by mouth 2 (two) times daily as needed for mild constipation. 12/23/20   Hosie Poisson, MD  sertraline (ZOLOFT) 25 MG tablet Take 1 tablet (25 mg total) by mouth daily. 10/06/20   Nita Sells, MD  sodium bicarbonate 650 MG tablet Take 1 tablet (650 mg total) by mouth 2 (two) times daily. 10/06/20   Nita Sells, MD  vitamin B-12 100 MCG tablet Take 1 tablet (100 mcg total) by mouth daily. 12/24/20   Hosie Poisson, MD     Allergies    Patient has no known allergies.   Review of Systems   Review of Systems A comprehensive review of systems was completed and negative except  as noted in HPI.    Physical Exam BP 131/70 (BP Location: Left Arm)   Pulse 77   Temp 97.6 F (36.4 C) (Oral)   Resp 17   Ht '5\' 3"'  (1.6 m)   Wt 49.9 kg   SpO2 98%   BMI 19.49 kg/m   Physical Exam Vitals and nursing note reviewed.  Constitutional:      Appearance: Normal appearance.  HENT:     Head: Normocephalic and atraumatic.     Nose: Nose normal.     Mouth/Throat:     Mouth: Mucous membranes are dry.  Eyes:     Extraocular Movements: Extraocular movements intact.     Conjunctiva/sclera: Conjunctivae normal.  Cardiovascular:     Rate and Rhythm: Normal rate.  Pulmonary:     Effort: Pulmonary effort is normal.     Breath sounds: Normal  breath sounds.  Abdominal:     General: Abdomen is flat.     Palpations: Abdomen is soft.     Tenderness: There is no abdominal tenderness.  Musculoskeletal:        General: No swelling. Normal range of motion.     Cervical back: Neck supple.     Comments: S/p R BKA, L foot with chronic deformity, no skin breakdowns. Muscle atrophy.   Skin:    General: Skin is warm and dry.     Comments: Sacral ulcer with granulation tissue in bed, no signs of active infection, no foul odor or purulent discharge. See photo.   Neurological:     General: No focal deficit present.     Mental Status: She is alert and oriented to person, place, and time.  Psychiatric:        Mood and Affect: Mood normal.        ED Results / Procedures / Treatments   Labs (all labs ordered are listed, but only abnormal results are displayed) Labs Reviewed  BASIC METABOLIC PANEL - Abnormal; Notable for the following components:      Result Value   Sodium 133 (*)    Glucose, Bld 161 (*)    BUN 53 (*)    Creatinine, Ser 1.26 (*)    GFR, Estimated 49 (*)    All other components within normal limits  CBC WITH DIFFERENTIAL/PLATELET - Abnormal; Notable for the following components:   RBC 2.50 (*)    Hemoglobin 8.9 (*)    HCT 28.2 (*)    MCV 112.8 (*)    MCH  35.6 (*)    RDW 21.1 (*)    All other components within normal limits    EKG None  Radiology No results found.  Procedures Procedures  Medications Ordered in the ED Medications - No data to display   MDM Rules/Calculators/A&P MDM Patient with presentation very similar to just a few days ago. Will recheck labs to compare. Ulcer is at baseline. No signs of significant infection. Ankle is also at baseline. Spoke with SNF staff as in HPI. Will also try to contact husband to address his concerns.   ED Course  I have reviewed the triage vital signs and the nursing notes.  Pertinent labs & imaging results that were available during my care of the patient were reviewed by me and considered in my medical decision making (see chart for details).  Clinical Course as of 01/17/21 2016  Sat Jan 17, 2021  1840 Attempted to call husband but no answer. Left message on voicemail.  [CS]  1944 CBC is at baseline, no leukocytosis. BMP without significant hyperglycemia, baseline kidney function and no signs of acidosis.  [CS]  2014 Attempted again to contact the patient's husband but no answer. No indication for admission and patient is stable to return to her SNF.  [CS]    Clinical Course User Index [CS] Truddie Hidden, MD    Final Clinical Impression(s) / ED Diagnoses Final diagnoses:  Skin ulcer of sacrum with fat layer exposed [facility])    Rx / [place] Orders ED Discharge Orders    None       Truddie Hidden, MD 01/17/21 2016

## 2021-01-18 ENCOUNTER — Emergency Department (HOSPITAL_COMMUNITY): Payer: BC Managed Care – PPO

## 2021-01-18 ENCOUNTER — Other Ambulatory Visit: Payer: Self-pay

## 2021-01-18 ENCOUNTER — Inpatient Hospital Stay (HOSPITAL_COMMUNITY)
Admission: EM | Admit: 2021-01-18 | Discharge: 2021-01-24 | DRG: 539 | Disposition: A | Discharge status: Other Acute Inpatient Hospital | Payer: BC Managed Care – PPO | Attending: Internal Medicine | Admitting: Internal Medicine

## 2021-01-18 ENCOUNTER — Encounter (HOSPITAL_COMMUNITY): Payer: Self-pay

## 2021-01-18 DIAGNOSIS — Z7189 Other specified counseling: Secondary | ICD-10-CM | POA: Diagnosis not present

## 2021-01-18 DIAGNOSIS — Z89511 Acquired absence of right leg below knee: Secondary | ICD-10-CM

## 2021-01-18 DIAGNOSIS — A419 Sepsis, unspecified organism: Secondary | ICD-10-CM

## 2021-01-18 DIAGNOSIS — I959 Hypotension, unspecified: Secondary | ICD-10-CM | POA: Diagnosis present

## 2021-01-18 DIAGNOSIS — R824 Acetonuria: Secondary | ICD-10-CM | POA: Diagnosis present

## 2021-01-18 DIAGNOSIS — R6510 Systemic inflammatory response syndrome (SIRS) of non-infectious origin without acute organ dysfunction: Secondary | ICD-10-CM | POA: Diagnosis present

## 2021-01-18 DIAGNOSIS — L89153 Pressure ulcer of sacral region, stage 3: Secondary | ICD-10-CM | POA: Diagnosis present

## 2021-01-18 DIAGNOSIS — K219 Gastro-esophageal reflux disease without esophagitis: Secondary | ICD-10-CM | POA: Diagnosis present

## 2021-01-18 DIAGNOSIS — R809 Proteinuria, unspecified: Secondary | ICD-10-CM | POA: Diagnosis present

## 2021-01-18 DIAGNOSIS — I129 Hypertensive chronic kidney disease with stage 1 through stage 4 chronic kidney disease, or unspecified chronic kidney disease: Secondary | ICD-10-CM | POA: Diagnosis present

## 2021-01-18 DIAGNOSIS — N1831 Chronic kidney disease, stage 3a: Secondary | ICD-10-CM | POA: Diagnosis present

## 2021-01-18 DIAGNOSIS — Z79890 Hormone replacement therapy (postmenopausal): Secondary | ICD-10-CM

## 2021-01-18 DIAGNOSIS — R339 Retention of urine, unspecified: Secondary | ICD-10-CM | POA: Diagnosis present

## 2021-01-18 DIAGNOSIS — R627 Adult failure to thrive: Secondary | ICD-10-CM | POA: Diagnosis present

## 2021-01-18 DIAGNOSIS — E8809 Other disorders of plasma-protein metabolism, not elsewhere classified: Secondary | ICD-10-CM | POA: Diagnosis present

## 2021-01-18 DIAGNOSIS — E860 Dehydration: Secondary | ICD-10-CM | POA: Diagnosis present

## 2021-01-18 DIAGNOSIS — Z9483 Pancreas transplant status: Secondary | ICD-10-CM

## 2021-01-18 DIAGNOSIS — N179 Acute kidney failure, unspecified: Secondary | ICD-10-CM

## 2021-01-18 DIAGNOSIS — Z20822 Contact with and (suspected) exposure to covid-19: Secondary | ICD-10-CM | POA: Diagnosis present

## 2021-01-18 DIAGNOSIS — E1069 Type 1 diabetes mellitus with other specified complication: Secondary | ICD-10-CM | POA: Diagnosis present

## 2021-01-18 DIAGNOSIS — Z515 Encounter for palliative care: Secondary | ICD-10-CM | POA: Diagnosis not present

## 2021-01-18 DIAGNOSIS — I10 Essential (primary) hypertension: Secondary | ICD-10-CM | POA: Diagnosis present

## 2021-01-18 DIAGNOSIS — M4628 Osteomyelitis of vertebra, sacral and sacrococcygeal region: Principal | ICD-10-CM | POA: Diagnosis present

## 2021-01-18 DIAGNOSIS — Z940 Kidney transplant status: Secondary | ICD-10-CM | POA: Diagnosis not present

## 2021-01-18 DIAGNOSIS — D631 Anemia in chronic kidney disease: Secondary | ICD-10-CM | POA: Diagnosis present

## 2021-01-18 DIAGNOSIS — Z794 Long term (current) use of insulin: Secondary | ICD-10-CM

## 2021-01-18 DIAGNOSIS — Z681 Body mass index (BMI) 19 or less, adult: Secondary | ICD-10-CM | POA: Diagnosis not present

## 2021-01-18 DIAGNOSIS — L89154 Pressure ulcer of sacral region, stage 4: Secondary | ICD-10-CM | POA: Diagnosis present

## 2021-01-18 DIAGNOSIS — E1022 Type 1 diabetes mellitus with diabetic chronic kidney disease: Secondary | ICD-10-CM | POA: Diagnosis present

## 2021-01-18 DIAGNOSIS — L89622 Pressure ulcer of left heel, stage 2: Secondary | ICD-10-CM | POA: Diagnosis present

## 2021-01-18 DIAGNOSIS — Z79899 Other long term (current) drug therapy: Secondary | ICD-10-CM

## 2021-01-18 DIAGNOSIS — R823 Hemoglobinuria: Secondary | ICD-10-CM | POA: Diagnosis present

## 2021-01-18 DIAGNOSIS — E039 Hypothyroidism, unspecified: Secondary | ICD-10-CM | POA: Diagnosis present

## 2021-01-18 DIAGNOSIS — I9589 Other hypotension: Secondary | ICD-10-CM | POA: Diagnosis not present

## 2021-01-18 DIAGNOSIS — Z7952 Long term (current) use of systemic steroids: Secondary | ICD-10-CM

## 2021-01-18 LAB — CBC WITH DIFFERENTIAL/PLATELET
Abs Immature Granulocytes: 0.04 10*3/uL (ref 0.00–0.07)
Basophils Absolute: 0.1 10*3/uL (ref 0.0–0.1)
Basophils Relative: 1 %
Eosinophils Absolute: 0.1 10*3/uL (ref 0.0–0.5)
Eosinophils Relative: 1 %
HCT: 29.6 % — ABNORMAL LOW (ref 36.0–46.0)
Hemoglobin: 9.6 g/dL — ABNORMAL LOW (ref 12.0–15.0)
Immature Granulocytes: 0 %
Lymphocytes Relative: 21 %
Lymphs Abs: 1.8 10*3/uL (ref 0.7–4.0)
MCH: 35.8 pg — ABNORMAL HIGH (ref 26.0–34.0)
MCHC: 32.4 g/dL (ref 30.0–36.0)
MCV: 110.4 fL — ABNORMAL HIGH (ref 80.0–100.0)
Monocytes Absolute: 1.4 10*3/uL — ABNORMAL HIGH (ref 0.1–1.0)
Monocytes Relative: 15 %
Neutro Abs: 5.5 10*3/uL (ref 1.7–7.7)
Neutrophils Relative %: 62 %
Platelets: 299 10*3/uL (ref 150–400)
RBC: 2.68 MIL/uL — ABNORMAL LOW (ref 3.87–5.11)
RDW: 21 % — ABNORMAL HIGH (ref 11.5–15.5)
WBC: 8.9 10*3/uL (ref 4.0–10.5)
nRBC: 0 % (ref 0.0–0.2)

## 2021-01-18 LAB — URINALYSIS, ROUTINE W REFLEX MICROSCOPIC
Bilirubin Urine: NEGATIVE
Glucose, UA: NEGATIVE mg/dL
Ketones, ur: 5 mg/dL — AB
Nitrite: NEGATIVE
Protein, ur: >=300 mg/dL — AB
RBC / HPF: >50 RBC/hpf — ABNORMAL HIGH (ref 0–5)
Specific Gravity, Urine: 1.014 (ref 1.005–1.030)
WBC, UA: >50 WBC/hpf — ABNORMAL HIGH (ref 0–5)
pH: 6 (ref 5.0–8.0)

## 2021-01-18 LAB — COMPREHENSIVE METABOLIC PANEL
ALT: 11 U/L (ref 0–44)
AST: 18 U/L (ref 15–41)
Albumin: 2.3 g/dL — ABNORMAL LOW (ref 3.5–5.0)
Alkaline Phosphatase: 120 U/L (ref 38–126)
Anion gap: 10 (ref 5–15)
BUN: 49 mg/dL — ABNORMAL HIGH (ref 8–23)
CO2: 23 mmol/L (ref 22–32)
Calcium: 10.3 mg/dL (ref 8.9–10.3)
Chloride: 102 mmol/L (ref 98–111)
Creatinine, Ser: 1.1 mg/dL — ABNORMAL HIGH (ref 0.44–1.00)
GFR, Estimated: 57 mL/min — ABNORMAL LOW (ref >60)
Glucose, Bld: 87 mg/dL (ref 70–99)
Potassium: 4.5 mmol/L (ref 3.5–5.1)
Sodium: 135 mmol/L (ref 135–145)
Total Bilirubin: 0.7 mg/dL (ref 0.3–1.2)
Total Protein: 5.7 g/dL — ABNORMAL LOW (ref 6.5–8.1)

## 2021-01-18 LAB — PROTIME-INR
INR: 1.3 — ABNORMAL HIGH (ref 0.8–1.2)
Prothrombin Time: 15.3 seconds — ABNORMAL HIGH (ref 11.4–15.2)

## 2021-01-18 LAB — SARS CORONAVIRUS 2 BY RT PCR (HOSPITAL ORDER, PERFORMED IN ~~LOC~~ HOSPITAL LAB): SARS Coronavirus 2: NEGATIVE

## 2021-01-18 LAB — LACTIC ACID, PLASMA: Lactic Acid, Venous: 1.2 mmol/L (ref 0.5–1.9)

## 2021-01-18 LAB — APTT: aPTT: 41 seconds — ABNORMAL HIGH (ref 24–36)

## 2021-01-18 MED ORDER — VANCOMYCIN HCL 750 MG/150ML IV SOLN
750.0000 mg | INTRAVENOUS | Status: DC
  Administered 2021-01-19: 750 mg via INTRAVENOUS
  Filled 2021-01-18 (×3): qty 150

## 2021-01-18 MED ORDER — PIPERACILLIN-TAZOBACTAM 3.375 G IVPB
3.3750 g | Freq: Three times a day (TID) | INTRAVENOUS | Status: DC
  Administered 2021-01-19 – 2021-01-20 (×6): 3.375 g via INTRAVENOUS
  Filled 2021-01-18 (×6): qty 50

## 2021-01-18 MED ORDER — ENOXAPARIN SODIUM 40 MG/0.4ML ~~LOC~~ SOLN
40.0000 mg | SUBCUTANEOUS | Status: DC
  Administered 2021-01-19 – 2021-01-23 (×6): 40 mg via SUBCUTANEOUS
  Filled 2021-01-18 (×6): qty 0.4

## 2021-01-18 MED ORDER — LACTATED RINGERS IV BOLUS (SEPSIS): 1000.0000 mL | Freq: Once | INTRAVENOUS | Status: DC

## 2021-01-18 MED ORDER — LACTATED RINGERS IV SOLN: INTRAVENOUS | Status: DC

## 2021-01-18 MED ORDER — VANCOMYCIN HCL IN DEXTROSE 1-5 GM/200ML-% IV SOLN
1000.0000 mg | Freq: Once | INTRAVENOUS | Status: AC
  Administered 2021-01-19: 1000 mg via INTRAVENOUS
  Filled 2021-01-18: qty 200

## 2021-01-18 MED ORDER — LACTATED RINGERS IV BOLUS
1000.0000 mL | Freq: Once | INTRAVENOUS | Status: AC
  Administered 2021-01-18: 1000 mL via INTRAVENOUS

## 2021-01-18 MED ORDER — LACTATED RINGERS IV BOLUS (SEPSIS)
500.0000 mL | Freq: Once | INTRAVENOUS | Status: AC
  Administered 2021-01-19: 500 mL via INTRAVENOUS

## 2021-01-18 MED ORDER — PROCHLORPERAZINE EDISYLATE 10 MG/2ML IJ SOLN
5.0000 mg | INTRAMUSCULAR | Status: DC | PRN
  Administered 2021-01-20 – 2021-01-23 (×3): 5 mg via INTRAVENOUS
  Filled 2021-01-18 (×3): qty 2

## 2021-01-18 MED ORDER — ACETAMINOPHEN 650 MG RE SUPP: 650.0000 mg | Freq: Four times a day (QID) | RECTAL | Status: DC | PRN

## 2021-01-18 MED ORDER — ACETAMINOPHEN 325 MG PO TABS: 650.0000 mg | ORAL_TABLET | Freq: Four times a day (QID) | ORAL | Status: DC | PRN

## 2021-01-18 NOTE — ED Provider Notes (Signed)
[facility] EMERGENCY DEPARTMENT Provider Note   CSN: [id number] Arrival date & time: 01/18/21  1836     History Chief Complaint  Patient presents with  . Altered Mental Status    Ninel Abdella is a 60 y.o. 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 The patient has been seen in the emergency department twice in the past week, and had been discharged 10 days ago to the SNF after hospitalization.  She was discharged with a PICC line and is receiving IV vancomycin and Rocephin until February per ID consult.    The patient herself does appear confused, is only moderate on exam, cannot give me any direct answers.  The ED staff here who saw her last night reported that she seemed more lucid yesterday and was talking.  In the ED yesterday she had basic labs done, was subsequently discharged back to tap to continue her antibiotics.  HPI     Past Medical History:  Diagnosis Date  . Anemia of chronic renal failure   . Diabetes mellitus without complication [facility])     Patient Active Problem List   Diagnosis Date Noted  . SIRS (systemic inflammatory response syndrome) ([place]) 01/18/2021  . Malnutrition of moderate degree 01/07/2021  . Acute osteomyelitis of sacrum ([place])   . Failure to thrive in adult   . Unspecified severe protein-calorie malnutrition ([place]) 12/10/2020  . Acute metabolic encephalopathy 52/84/1324  . Sacral decubitus ulcer, stage IV ([place]) 12/08/2020  . Cellulitis 12/08/2020  . Hypotension 12/08/2020  . Malodorous urine 12/08/2020  . Erosive esophagitis 12/08/2020  . Hypothyroid 12/08/2020  . Depression 12/08/2020  . Type 1 diabetes mellitus with hyperlipidemia  ([place]) 12/08/2020  . Sepsis ([place]) 12/07/2020  . Hypoalbuminemia 12/07/2020  . Hyponatremia 12/07/2020  . CAP (community acquired pneumonia) 12/07/2020  . Acute urinary retention 12/07/2020  . Coffee ground emesis   . Gastroesophageal reflux disease with esophagitis and hemorrhage   . Long-term use of immunosuppressant medication   . GI bleed 11/08/2020  . AKI (acute kidney injury) ([place]) 10/29/2020  . Gangrene of right foot ([place])   . Subacute osteomyelitis of right foot ([place])   . Type 1 diabetes mellitus with complication, with long term current use of insulin pump ([place]) 09/27/2020  . Pressure ulcer of BKA stump ([place]) 09/27/2020  . Diabetic ketoacidosis ([place]) 09/26/2020  . Acute kidney injury superimposed on CKD ([place]) 09/26/2020  . Cellulitis and abscess of right leg 09/26/2020  . 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 S/p cadaver renal transplant 05/14/2020  . Chronic kidney disease (CKD) stage G3a/A1, moderately decreased glomerular filtration rate (GFR) between 45-59 mL/min/1.73 square meter and albuminuria creatinine ratio less than 30 mg/g (H[facility]) 05/14/2020  . Hypertension 05/14/2020    Past Surgical History:  Procedure Laterality Date  . AMPUTATION Right 10/01/2020   Procedure: RIGHT BELOW KNEE AMPUTATION;  Surgeon: Newt Minion, MD;  Location: [place];  Service: Orthopedics;  Laterality: Right;  . BLADDER SURGERY    . ESOPHAGOGASTRODUODENOSCOPY (EGD) WITH PROPOFOL N/A 11/10/2020   Procedure: ESOPHAGOGASTRODUODENOSCOPY (EGD) WITH PROPOFOL;  Surgeon: Irene Shipper, MD;  Location: [facility];  Service: Endoscopy;  Laterality: N/A;  . KIDNEY TRANSPLANT    . LEG SURGERY       OB  History   No obstetric history on file.     Family History  Problem Relation Age of Onset  . Hypertension Mother   . Hypertension Father     Social History   Tobacco Use  . Smoking status: Never Smoker  . Smokeless tobacco: Never Used  Vaping Use  . Vaping Use: Never used   Substance Use Topics  . Alcohol use: Never  . Drug use: Never    Home Medications Prior to Admission medications   Medication Sig Start Date End Date Taking? Authorizing Provider  azaTHIOprine (IMURAN) 50 MG tablet Take 50 mg by mouth daily. 08/23/20  Yes [provider]  cefTRIAXone (ROCEPHIN) IVPB Inject 2 g into the vein daily. Indication:  Osteomyelitis First Dose: Yes Last Day of Therapy:  02/12/2021 Labs - Once weekly:  CBC/D and BMP, Labs - Every other week:  ESR and CRP Method of administration: IV Push Method of administration may be changed at the discretion of home infusion pharmacist based upon assessment of the patient and/or caregiver's ability to self-administer the medication ordered. 01/08/21 02/19/21 Yes Dessa Phi, DO  cycloSPORINE (SANDIMMUNE) 25 MG capsule Take 75 mg by mouth 2 (two) times daily.   Yes [provider]  doxycycline (VIBRAMYCIN) 100 MG capsule Take 1 capsule (100 mg total) by mouth 2 (two) times daily. Treat through 02/12/21 01/08/21 02/12/21 Yes Dessa Phi, DO  feeding supplement (ENSURE ENLIVE / ENSURE PLUS) LIQD Take 237 mLs by mouth 2 (two) times daily between meals. 12/23/20  Yes Hosie Poisson, MD  folic acid (FOLVITE) 1 MG tablet Take 1 tablet (1 mg total) by mouth daily. 12/24/20  Yes Hosie Poisson, MD  HYDROcodone-acetaminophen (NORCO/VICODIN) 5-325 MG tablet Take 1 tablet by mouth every 4 (four) hours as needed for moderate pain.   Yes [provider]  insulin glargine (LANTUS) 100 UNIT/ML injection Inject 0.04 mLs (4 Units total) into the skin daily. 01/08/21  Yes Dessa Phi, DO  insulin lispro (HUMALOG) 100 UNIT/ML injection Inject 2 Units into the skin 3 (three) times daily before meals. Sliding scale if 200-250=2 units;251-300=4units;301-350=6 units;351-400=8 units;104-450=10 units;451-500=12 units subcutaneously before meals and at bedtime for DM   Yes [provider]  levothyroxine (SYNTHROID) 50 MCG  tablet Take 1 tablet (50 mcg total) by mouth daily. 12/23/20  Yes Hosie Poisson, MD  metoprolol tartrate (LOPRESSOR) 25 MG tablet Take 12.5 mg by mouth 2 (two) times daily.  09/15/20  Yes [provider]  Multiple Vitamin (MULTIVITAMIN WITH MINERALS) TABS tablet Take 1 tablet by mouth daily. 12/24/20  Yes Hosie Poisson, MD  nutrition supplement, JUVEN, (JUVEN) PACK Take 1 packet by mouth 2 (two) times daily between meals. 12/23/20  Yes Hosie Poisson, MD  omeprazole (PRILOSEC) 20 MG capsule Take 20 mg by mouth daily.   Yes [provider]  pantoprazole (PROTONIX) 40 MG tablet Take 1 tablet (40 mg total) by mouth 2 (two) times daily. 11/12/20  Yes Ghimire, Dante Gang, MD  polyethylene glycol (MIRALAX / GLYCOLAX) 17 g packet Take 17 g by mouth daily. 12/24/20  Yes Hosie Poisson, MD  rosuvastatin (CRESTOR) 5 MG tablet Take 1 tablet (5 mg total) by mouth at bedtime. 11/07/20  Yes Swayze, Ava, DO  senna-docusate (SENOKOT-S) 8.6-50 MG tablet Take 2 tablets by mouth 2 (two) times daily as needed for mild constipation. 12/23/20  Yes Hosie Poisson, MD  sertraline (ZOLOFT) 25 MG tablet Take 1 tablet (25 mg total) by mouth daily. 10/06/20  Yes Nita Sells, MD  sodium  bicarbonate 650 MG tablet Take 1 tablet (650 mg total) by mouth 2 (two) times daily. 10/06/20  Yes Nita Sells, MD  vitamin B-12 100 MCG tablet Take 1 tablet (100 mcg total) by mouth daily. 12/24/20  Yes Hosie Poisson, MD  acetaminophen (TYLENOL) 325 MG tablet Take 2 tablets (650 mg total) by mouth every 6 (six) hours as needed for mild pain (or Fever >/= 101). Patient taking differently: Take 650 mg by mouth every 6 (six) hours as needed for mild pain or fever. 10/06/20   Nita Sells, MD  atorvastatin (LIPITOR) 20 MG tablet Take 20 mg by mouth daily. 11/25/20   [provider]  collagenase (SANTYL) ointment Apply topically daily. 11/08/20   Swayze, Ava, DO  lisinopril (ZESTRIL) 5 MG tablet Take 5 mg  by mouth daily. 12/03/20   [provider]  liver oil-zinc oxide (DESITIN) 40 % ointment Apply topically daily. 12/23/20   Hosie Poisson, MD  predniSONE (DELTASONE) 5 MG tablet Take 5 mg by mouth daily. 12/15/20   [provider]    Allergies    Patient has no known allergies.  Review of Systems   Review of Systems  Unable to perform ROS: Mental status change (level 5 caveat)    Physical Exam Updated Vital Signs BP 113/60   Pulse 96   Temp (!) 100.4 F (38 C) (Rectal)   Resp 17   Ht '5\' 3"'  (1.6 m)   Wt 49 kg   SpO2 99%   BMI 19.14 kg/m   Physical Exam Constitutional:      Comments: Thin, frail, chronically ill appearing, mumbling  HENT:     Head: Normocephalic and atraumatic.  Eyes:     Conjunctiva/sclera: Conjunctivae normal.     Pupils: Pupils are equal, round, and reactive to light.  Cardiovascular:     Rate and Rhythm: Regular rhythm. Tachycardia present.     Pulses: Normal pulses.  Pulmonary:     Effort: Pulmonary effort is normal. No respiratory distress.  Abdominal:     General: There is no distension.     Tenderness: There is no abdominal tenderness.  Skin:    General: Skin is warm and dry.     Comments: Stage 3/4 sacral decubitus ulcer Right BKA Left heel ulceration  Neurological:     General: No focal deficit present.     Mental Status: She is alert. Mental status is at baseline.     ED Results / Procedures / Treatments   Labs (all labs ordered are listed, but only abnormal results are displayed) Labs Reviewed  COMPREHENSIVE METABOLIC PANEL - Abnormal; Notable for the following components:      Result Value   BUN 49 (*)    Creatinine, Ser 1.10 (*)    Total Protein 5.7 (*)    Albumin 2.3 (*)    GFR, Estimated 57 (*)    All other components within normal limits  CBC WITH DIFFERENTIAL/PLATELET - Abnormal; Notable for the following components:   RBC 2.68 (*)    Hemoglobin 9.6 (*)    HCT 29.6 (*)    MCV 110.4 (*)    MCH 35.8  (*)    RDW 21.0 (*)    Monocytes Absolute 1.4 (*)    All other components within normal limits  PROTIME-INR - Abnormal; Notable for the following components:   Prothrombin Time 15.3 (*)    INR 1.3 (*)    All other components within normal limits  APTT - Abnormal; Notable for the  following components:   aPTT 41 (*)    All other components within normal limits  URINALYSIS, ROUTINE W REFLEX MICROSCOPIC - Abnormal; Notable for the following components:   APPearance TURBID (*)    Hgb urine dipstick SMALL (*)    Ketones, ur 5 (*)    Protein, ur >=300 (*)    Leukocytes,Ua LARGE (*)    RBC / HPF >50 (*)    WBC, UA >50 (*)    Bacteria, UA RARE (*)    Crystals PRESENT (*)    All other components within normal limits  CULTURE, BLOOD (SINGLE)  URINE CULTURE  SARS CORONAVIRUS 2 BY RT PCR (HOSPITAL ORDER, [facility])  CULTURE, BLOOD (SINGLE)  LACTIC ACID, PLASMA  CBC WITH DIFFERENTIAL/PLATELET  COMPREHENSIVE METABOLIC PANEL    EKG None  Radiology CT Head Wo Contrast  Result Date: 01/18/2021 CLINICAL DATA:  60 year old female with altered mental status. EXAM: CT HEAD WITHOUT CONTRAST TECHNIQUE: Contiguous axial images were obtained from the base of the skull through the vertex without intravenous contrast. COMPARISON:  Head CT dated 09/26/2020. FINDINGS: Brain: Mild age-related atrophy and moderate chronic microvascular ischemic changes. There is no acute intracranial hemorrhage. No mass effect midline shift. No extra-axial fluid collection. Vascular: No hyperdense vessel or unexpected calcification. Skull: Normal. Negative for fracture or focal lesion. Sinuses/Orbits: The visualized paranasal sinuses are clear. There is complete opacification of the mastoid air cells. Other: None IMPRESSION: 1. No acute intracranial pathology. 2. Age-related atrophy and chronic microvascular ischemic changes. 3. Bilateral mastoid effusions. Electronically Signed   By: Anner Crete M.D.   On: 01/18/2021 22:01   DG Chest Port 1 View  Result Date: 01/18/2021 CLINICAL DATA:  60 year old female with sepsis. EXAM: PORTABLE CHEST 1 VIEW COMPARISON:  Chest radiograph dated 12/25/2020. FINDINGS: Right-sided PICC with tip at the cavoatrial junction. No focal consolidation, pleural effusion or pneumothorax. Borderline cardiomegaly. No acute osseous pathology. Osteopenia. IMPRESSION: No acute cardiopulmonary process. Electronically Signed   By: Anner Crete M.D.   On: 01/18/2021 20:57    Procedures .Critical Care Performed by: Wyvonnia Dusky, MD Authorized by: Wyvonnia Dusky, MD   Critical care provider statement:    Critical care time (minutes):  45   Critical care was necessary to treat or prevent imminent or life-threatening deterioration of the following conditions:  Sepsis   Critical care was time spent personally by me on the following activities:  Discussions with consultants, evaluation of patient's response to treatment, examination of patient, ordering and performing treatments and interventions, ordering and review of laboratory studies, ordering and review of radiographic studies, pulse oximetry, re-evaluation of patient's condition, obtaining history from patient or surrogate and review of old charts   (including critical care time)  Medications Ordered in ED Medications  piperacillin-tazobactam (ZOSYN) IVPB 3.375 g (has no administration in time range)  vancomycin (VANCOCIN) IVPB 1000 mg/200 mL premix (has no administration in time range)  vancomycin (VANCOREADY) IVPB 750 mg/150 mL (has no administration in time range)  enoxaparin (LOVENOX) injection 30 mg (has no administration in time range)  acetaminophen (TYLENOL) tablet 650 mg (has no administration in time range)    Or  acetaminophen (TYLENOL) suppository 650 mg (has no administration in time range)  lactated ringers infusion (has no administration in time range)  prochlorperazine  (COMPAZINE) injection 5 mg (has no administration in time range)  lactated ringers bolus 1,000 mL (has no administration in time range)    And  lactated  ringers bolus 500 mL (has no administration in time range)  lactated ringers bolus 1,000 mL (0 mLs Intravenous Stopped 01/18/21 2104)    ED Course  I have reviewed the triage vital signs and the nursing notes.  Pertinent labs & imaging results that were available during my care of the patient were reviewed by me and considered in my medical decision making (see chart for details).  This patient presents with concern for confusion/AMS.  Found to be febrile, hypotensive.  This involves an extensive number of treatment options, and is a complaint that carries with it a high risk of complications and morbidity.  The differential diagnosis includes infection/sepsis vs metabolic encephalopathy vs other  I ordered, reviewed, and interpreted labs, which included UA with leuks, no nitrites - chronic foley.  BMP largely unremarkable, some elevation in BUN suggestive of possible dehydration.  CBC with hgb at baseline, WBC 8.9.  Lactate 1.2. I ordered medication IV vanco, IV zosyn for BS coverage, IV fluids for infection I ordered imaging studies which included CT head contrast, DG chest I independently visualized and interpreted imaging which showed no acute infarct, no focal consolidation and the monitor tracing which showed sinus rhythm Additional history was obtained from husband by phone Previous records obtained and reviewed showing last hospitalization and prior ED visits  Clinical Course as of 01/18/21 2252  Sun Jan 18, 2021  2156 Per medical record review, recommendations, IV ceftriaxone 2 g every 24 and doxycycline 100 mg p.o. twice daily, End date 02/12/2021 [MT]  2212 Admitted to Dr Olevia Bowens for infection evaluation, pending blood cultures to evaluate for bacteremia.  We'll broaden her antibiotics back to include vancomycin to cover for MRSA, and  zosyn.  BP has been stable and improved with fluids.  Lactate was wnl.  I don't think this is septic shock - likely some hypoperfusion. [MT]    Clinical Course User Index [MT] Shalva Rozycki, Carola Rhine, MD   Final Clinical Impression(s) / ED Diagnoses Final diagnoses:  Sepsis, due to unspecified organism, unspecified whether acute organ dysfunction present [facility])    Rx / DC Orders ED Discharge Orders    None       Wyvonnia Dusky, MD 01/18/21 2252

## 2021-01-18 NOTE — H&P (Incomplete)
History and Physical    [name] TXM:[id number] DOB: 06/16/1961 DOA: 01/18/2021  PCP: [name], MD   Patient coming from: Home.   I have personally briefly reviewed patient's old medical records in [place]  Chief Complaint: AMS.  HPI: [name] is a 60 y.o. female with medical history significant of   ED Course: Initial vital signs were temperature   Labwork: Urinalysis was turbid, with small hemoglobinuria, ketonuria 5 and proteinuria more than 300 mg/dL.  Nitrite was negative, leukocyte Estrace was large, microscopic examination showed more than 50 RBC and more than 50 WBC with rare bacteria.  There was positive WBC clumps, mucus, budding yeast and crystals present.  CBC showed a white count of 8.9, hemoglobin 9.6 g/dL with an MCV of 110.4 fL and platelets 299.  PT 15.3, INR 1.3, PTT 41.  Lactic acid was normal.  CMP shows a BUN of 49 and creatinine of 1.10 mg/dL with a GFR of 57 mL/min.  Total protein is 5.7 and albumin 2.3 g/dL.  The rest of the CMP values are unremarkable.  Coronavirus PCR was negative.  Review of Systems: As per HPI otherwise all other systems reviewed and are negative.  Past Medical History:  Diagnosis Date  . Anemia of chronic renal failure   . 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 AMPUTATION Right 10/01/2020   Procedure: RIGHT BELOW KNEE AMPUTATION;  Surgeon: Newt Minion, MD;  Location: [place];  Service: Orthopedics;  Laterality: Right;  . BLADDER SURGERY    . ESOPHAGOGASTRODUODENOSCOPY (EGD) WITH PROPOFOL N/A 11/10/2020   Procedure: ESOPHAGOGASTRODUODENOSCOPY (EGD) WITH PROPOFOL;  Surgeon: Irene Shipper, MD;  Location: [facility];  Service: Endoscopy;  Laterality: N/A;  . KIDNEY TRANSPLANT    . LEG SURGERY     Social History  reports that she has never smoked. She has never used smokeless tobacco. She reports that she does not drink alcohol and  does not use drugs.  No Known Allergies  Family History  Problem Relation Age of Onset  . Hypertension Mother   . Hypertension Father    Prior to Admission medications   Medication Sig Start Date End Date Taking? Authorizing Provider  azaTHIOprine (IMURAN) 50 MG tablet Take 50 mg by mouth daily. 08/23/20  Yes [provider]  cefTRIAXone (ROCEPHIN) IVPB Inject 2 g into the vein daily. Indication:  Osteomyelitis First Dose: Yes Last Day of Therapy:  02/12/2021 Labs - Once weekly:  CBC/D and BMP, Labs - Every other week:  ESR and CRP Method of administration: IV Push Method of administration may be changed at the discretion of home infusion pharmacist based upon assessment of the patient and/or caregiver's ability to self-administer the medication ordered. 01/08/21 02/19/21 Yes Dessa Phi, DO  cycloSPORINE (SANDIMMUNE) 25 MG capsule Take 75 mg by mouth 2 (two) times daily.   Yes [provider]  doxycycline (VIBRAMYCIN) 100 MG capsule Take 1 capsule (100 mg total) by mouth 2 (two) times daily. Treat through 02/12/21 01/08/21 02/12/21 Yes Dessa Phi, DO  feeding supplement (ENSURE ENLIVE / ENSURE PLUS) LIQD Take 237 mLs by mouth 2 (two) times daily between meals. 12/23/20  Yes Hosie Poisson, MD  folic acid (FOLVITE) 1 MG tablet Take 1 tablet (1 mg total) by mouth daily. 12/24/20  Yes Hosie Poisson, MD  HYDROcodone-acetaminophen (NORCO/VICODIN) 5-325 MG tablet Take 1 tablet by mouth every 4 (four) hours as needed for  moderate pain.   Yes [provider]  insulin glargine (LANTUS) 100 UNIT/ML injection Inject 0.04 mLs (4 Units total) into the skin daily. 01/08/21  Yes Dessa Phi, DO  insulin lispro (HUMALOG) 100 UNIT/ML injection Inject 2 Units into the skin 3 (three) times daily before meals. Sliding scale if 200-250=2 units;251-300=4units;301-350=6 units;351-400=8 units;104-450=10 units;451-500=12 units subcutaneously before meals and at bedtime for DM   Yes  [provider]  levothyroxine (SYNTHROID) 50 MCG tablet Take 1 tablet (50 mcg total) by mouth daily. 12/23/20  Yes Hosie Poisson, MD  metoprolol tartrate (LOPRESSOR) 25 MG tablet Take 12.5 mg by mouth 2 (two) times daily.  09/15/20  Yes [provider]  Multiple Vitamin (MULTIVITAMIN WITH MINERALS) TABS tablet Take 1 tablet by mouth daily. 12/24/20  Yes Hosie Poisson, MD  nutrition supplement, JUVEN, (JUVEN) PACK Take 1 packet by mouth 2 (two) times daily between meals. 12/23/20  Yes Hosie Poisson, MD  omeprazole (PRILOSEC) 20 MG capsule Take 20 mg by mouth daily.   Yes [provider]  pantoprazole (PROTONIX) 40 MG tablet Take 1 tablet (40 mg total) by mouth 2 (two) times daily. 11/12/20  Yes Ghimire, Dante Gang, MD  polyethylene glycol (MIRALAX / GLYCOLAX) 17 g packet Take 17 g by mouth daily. 12/24/20  Yes Hosie Poisson, MD  rosuvastatin (CRESTOR) 5 MG tablet Take 1 tablet (5 mg total) by mouth at bedtime. 11/07/20  Yes Swayze, Ava, DO  senna-docusate (SENOKOT-S) 8.6-50 MG tablet Take 2 tablets by mouth 2 (two) times daily as needed for mild constipation. 12/23/20  Yes Hosie Poisson, MD  sertraline (ZOLOFT) 25 MG tablet Take 1 tablet (25 mg total) by mouth daily. 10/06/20  Yes Nita Sells, MD  sodium bicarbonate 650 MG tablet Take 1 tablet (650 mg total) by mouth 2 (two) times daily. 10/06/20  Yes Nita Sells, MD  vitamin B-12 100 MCG tablet Take 1 tablet (100 mcg total) by mouth daily. 12/24/20  Yes Hosie Poisson, MD  acetaminophen (TYLENOL) 325 MG tablet Take 2 tablets (650 mg total) by mouth every 6 (six) hours as needed for mild pain (or Fever >/= 101). Patient taking differently: Take 650 mg by mouth every 6 (six) hours as needed for mild pain or fever. 10/06/20   Nita Sells, MD  atorvastatin (LIPITOR) 20 MG tablet Take 20 mg by mouth daily. 11/25/20   [provider]  collagenase (SANTYL) ointment Apply topically daily. 11/08/20    Swayze, Ava, DO  lisinopril (ZESTRIL) 5 MG tablet Take 5 mg by mouth daily. 12/03/20   [provider]  liver oil-zinc oxide (DESITIN) 40 % ointment Apply topically daily. 12/23/20   Hosie Poisson, MD  predniSONE (DELTASONE) 5 MG tablet Take 5 mg by mouth daily. 12/15/20   [provider]    Physical Exam: Vitals:   01/18/21 1916 01/18/21 2100 01/18/21 2130 01/18/21 2200  BP: (!) 80/45 (!) 102/51 (!) 95/54 113/60  Pulse:  100 97 96  Resp: 16 (!) 0 18 17  Temp: (!) 100.4 F (38 C)     TempSrc: Rectal     SpO2: 99% 97% 98% 99%  Weight:      Height:        Constitutional: NAD, calm, comfortable Eyes: PERRL, lids and conjunctivae normal ENMT: Mucous membranes are moist. Posterior pharynx clear of any exudate or lesions. Neck: normal, supple, no masses, no thyromegaly Respiratory: clear to auscultation bilaterally, no wheezing, no crackles. Normal respiratory effort. No accessory muscle use.  Cardiovascular: Regular rate  and rhythm, no murmurs / rubs / gallops. No extremity edema. 2+ pedal pulses. No carotid bruits.  Abdomen: no tenderness, no masses palpated. No hepatosplenomegaly. Bowel sounds positive.  Musculoskeletal: no clubbing / cyanosis. Good ROM, no contractures. Normal muscle tone.  Skin: no rashes, lesions, ulcers. No induration Neurologic: CN 2-12 grossly intact. Sensation intact, DTR normal. Strength 5/5 in all 4.  Psychiatric: Normal judgment and insight. Alert and oriented x 3. Normal mood.   Labs on Admission: I have personally reviewed following labs and imaging studies  CBC: Recent Labs  Lab 01/12/21 1205 01/17/21 1857 01/18/21 1921  WBC 7.2 6.9 8.9  NEUTROABS 4.5 2.9 5.5  HGB 9.3* 8.9* 9.6*  HCT 28.5* 28.2* 29.6*  MCV 110.0* 112.8* 110.4*  PLT 350 283 676    Basic Metabolic Panel: Recent Labs  Lab 01/12/21 1205 01/17/21 1857 01/18/21 1921  NA 128* 133* 135  K 4.2 4.1 4.5  CL 92* 100 102  CO2 _0 GLUCOSE 421* 161* 87  BUN  70* 53* 49*  CREATININE 1.14* 1.26* 1.10*  CALCIUM 10.3 9.8 10.3  MG 1.7  --   --     GFR: Estimated Creatinine Clearance: 41.5 mL/min (A) (by C-G formula based on SCr of 1.1 mg/dL (H)).  Liver Function Tests: Recent Labs  Lab 01/18/21 1921  AST 18  ALT 11  ALKPHOS 120  BILITOT 0.7  PROT 5.7*  ALBUMIN 2.3*    Urine analysis:    Component Value Date/Time   COLORURINE YELLOW 01/18/2021 2045   APPEARANCEUR TURBID (A) 01/18/2021 2045   LABSPEC 1.014 01/18/2021 2045   PHURINE 6.0 01/18/2021 2045   GLUCOSEU NEGATIVE 01/18/2021 2045   HGBUR SMALL (A) 01/18/2021 2045   BILIRUBINUR NEGATIVE 01/18/2021 2045   KETONESUR 5 (A) 01/18/2021 2045   PROTEINUR >=300 (A) 01/18/2021 2045   NITRITE NEGATIVE 01/18/2021 2045   LEUKOCYTESUR LARGE (A) 01/18/2021 2045    Radiological Exams on Admission: CT Head Wo Contrast  Result Date: 01/18/2021 CLINICAL DATA:  60 year old female with altered mental status. EXAM: CT HEAD WITHOUT CONTRAST TECHNIQUE: Contiguous axial images were obtained from the base of the skull through the vertex without intravenous contrast. COMPARISON:  Head CT dated 09/26/2020. FINDINGS: Brain: Mild age-related atrophy and moderate chronic microvascular ischemic changes. There is no acute intracranial hemorrhage. No mass effect midline shift. No extra-axial fluid collection. Vascular: No hyperdense vessel or unexpected calcification. Skull: Normal. Negative for fracture or focal lesion. Sinuses/Orbits: The visualized paranasal sinuses are clear. There is complete opacification of the mastoid air cells. Other: None IMPRESSION: 1. No acute intracranial pathology. 2. Age-related atrophy and chronic microvascular ischemic changes. 3. Bilateral mastoid effusions. Electronically Signed   By: Anner Crete M.D.   On: 01/18/2021 22:01   DG Chest Port 1 View  Result Date: 01/18/2021 CLINICAL DATA:  60 year old female with sepsis. EXAM: PORTABLE CHEST 1 VIEW COMPARISON:  Chest  radiograph dated 12/25/2020. FINDINGS: Right-sided PICC with tip at the cavoatrial junction. No focal consolidation, pleural effusion or pneumothorax. Borderline cardiomegaly. No acute osseous pathology. Osteopenia. IMPRESSION: No acute cardiopulmonary process. Electronically Signed   By: Anner Crete M.D.   On: 01/18/2021 20:57    EKG: Independently reviewed.  Assessment/Plan Principal Problem:   SIRS (systemic inflammatory response syndrome) (HCC) Active Problems:   Hypertension   Hypoalbuminemia   Sacral decubitus ulcer, stage IV (HCC)   Hypotension   Hypothyroid   Failure to thrive in adult    DVT prophylaxis: *** (Lovenox/Heparin/SCD's/anticoagulated/None (if  comfort care) Code Status:   *** (Full/Partial (specify details) Family Communication:  *** (Specify name, relationship. Do not write "discussed with patient". Specify tel # if discussed over the phone) Disposition Plan:   Patient is from:  ***  Anticipated DC to:  ***  Anticipated DC date:  ***  Anticipated DC barriers: ***  Consults called:  *** (with names) Admission status:  *** (inpatient / obs / tele / medical floor / SDU)  Severity of Illness:  Reubin Milan MD [facility]  How to contact the [facility] Attending or Consulting provider [place] or covering provider during after hours 7P -7A, for this patient?   1. Check the care team in [facility] and look for a) attending/consulting [facility] provider listed and b) the [facility] team listed 2. Log into [email or web address] and use [place]'s universal password to access. If you do not have the password, please contact the hospital operator. 3. Locate the [facility] provider you are looking for under [facility] and page to a number that you can be directly reached. 4. If you still have difficulty reaching the provider, please page the [facility] (Director on Call) for the Hospitalists listed on amion for assistance.  01/18/2021, 10:57 PM   This document was prepared using Dragon  voice recognition software and may contain some unintended transcription errors.

## 2021-01-18 NOTE — ED Triage Notes (Signed)
Pt sent from [place]. Husband states she is altered and requested her to be sent and eval as he feels she has a foot infection

## 2021-01-18 NOTE — Progress Notes (Signed)
Pharmacy Antibiotic Note  Letha Currey is a 60 y.o. female admitted on 01/18/2021 with sepsis.  Pharmacy has been consulted for vancomycin and zosyn dosing.  Plan: Zosyn 3.375 gm IV q8 hours Vancomycin 1gm IV x 1 then 750 mg IV q24 hours F/u renal function, cultures and clinical course  Height: '5\' 3"'$  (160 cm) Weight: 49 kg (108 lb 0.4 oz) IBW/kg (Calculated) : 52.4  Temp (24hrs), Avg:100.4 F (38 C), Min:100.4 F (38 C), Max:100.4 F (38 C)  Recent Labs  Lab 01/12/21 1205 01/17/21 1857 01/18/21 1921  WBC 7.2 6.9 8.9  CREATININE 1.14* 1.26* 1.10*  LATICACIDVEN  --   --  1.2    Estimated Creatinine Clearance: 41.5 mL/min (A) (by C-G formula based on SCr of 1.1 mg/dL (H)).    No Known Allergies  Thank you for allowing pharmacy to be a part of this patient's care.  Excell Seltzer [place]et 01/18/2021 10:26 PM

## 2021-01-18 NOTE — H&P (Signed)
History and Physical    Aditi Rovira XNT:[id number] DOB: 06-May-1961 DOA: 01/18/2021  PCP: [name], MD   Patient coming from: Home.   I have personally briefly reviewed patient's old medical records in [place]  Chief Complaint: AMS.  HPI: [name] is a 60 y.o. 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  She is currently confused and unable to provide further information.  Per Dr. Roland Earl note from yesterday, the patient seemed to be more alert and was complaining of pain in the ulcer site.   ED Course: Initial vital signs were temperature 100.4 F, pulse 100, respirations 16, BP 80/45 mmHg and O2 sat 99% on room air.  The patient received 2000 mL of LR bolus, vancomycin and Zosyn per pharmacy.  Her blood pressure normalized after the fluid bolus.  Labwork: Urinalysis was turbid, with small hemoglobinuria, ketonuria 5 and proteinuria more than 300 mg/dL.  Nitrite was negative, leukocyte Estrace was large, microscopic examination showed more than 50 RBC and more than 50 WBC with rare bacteria.  There was positive WBC clumps, mucus, budding yeast and crystals present.  CBC showed a white count of 8.9, hemoglobin 9.6 g/dL with an MCV of 110.4 fL and platelets 299.  PT 15.3, INR 1.3, PTT 41.  Lactic acid was normal.  CMP shows a BUN of 49 and creatinine of 1.10 mg/dL with a GFR of 57 mL/min.  Total protein is 5.7 and albumin 2.3 g/dL.  The rest of the CMP values are unremarkable.  Coronavirus PCR was negative.  Review of Systems: As per HPI otherwise all other systems reviewed and are negative.  Past Medical  History:  Diagnosis Date  . Anemia of chronic renal failure   . 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 AMPUTATION Right 10/01/2020   Procedure: RIGHT BELOW KNEE AMPUTATION;  Surgeon: Newt Minion, MD;  Location: [facility];  Service: Orthopedics;  Laterality: Right;  . BLADDER SURGERY    . ESOPHAGOGASTRODUODENOSCOPY (EGD) WITH PROPOFOL N/A 11/10/2020   Procedure: ESOPHAGOGASTRODUODENOSCOPY (EGD) WITH PROPOFOL;  Surgeon: Irene Shipper, MD;  Location: [facility];  Service: Endoscopy;  Laterality: N/A;  . KIDNEY TRANSPLANT    . LEG SURGERY     Social History  reports that she has never smoked. She has never used smokeless tobacco. She reports that she does not drink alcohol and does not use drugs.  No Known Allergies  Family History  Problem Relation Age of Onset  . Hypertension Mother   . Hypertension Father    Prior to Admission medications   Medication Sig Start Date End Date Taking? Authorizing Provider  azaTHIOprine (IMURAN) 50 MG tablet Take 50 mg by mouth daily. 08/23/20  Yes [provider]  cefTRIAXone (ROCEPHIN) IVPB Inject 2 g into the vein daily. Indication:  Osteomyelitis First Dose: Yes Last Day of Therapy:  02/12/2021 Labs - Once weekly:  CBC/D and BMP, Labs - Every other week:  ESR and CRP Method of administration: IV Push Method of administration may be changed at the discretion of home infusion pharmacist based upon assessment of the patient  and/or caregiver's ability to self-administer the medication ordered. 01/08/21 02/19/21 Yes Dessa Phi, DO  cycloSPORINE (SANDIMMUNE) 25 MG capsule Take 75 mg by mouth 2 (two) times daily.   Yes [provider]  doxycycline (VIBRAMYCIN) 100 MG capsule Take 1 capsule (100 mg total) by mouth 2 (two) times daily. Treat through 02/12/21 01/08/21 02/12/21 Yes Dessa Phi, DO  feeding supplement (ENSURE ENLIVE / ENSURE PLUS) LIQD Take 237 mLs by  mouth 2 (two) times daily between meals. 12/23/20  Yes Hosie Poisson, MD  folic acid (FOLVITE) 1 MG tablet Take 1 tablet (1 mg total) by mouth daily. 12/24/20  Yes Hosie Poisson, MD  HYDROcodone-acetaminophen (NORCO/VICODIN) 5-325 MG tablet Take 1 tablet by mouth every 4 (four) hours as needed for moderate pain.   Yes [provider]  insulin glargine (LANTUS) 100 UNIT/ML injection Inject 0.04 mLs (4 Units total) into the skin daily. 01/08/21  Yes Dessa Phi, DO  insulin lispro (HUMALOG) 100 UNIT/ML injection Inject 2 Units into the skin 3 (three) times daily before meals. Sliding scale if 200-250=2 units;251-300=4units;301-350=6 units;351-400=8 units;104-450=10 units;451-500=12 units subcutaneously before meals and at bedtime for DM   Yes [provider]  levothyroxine (SYNTHROID) 50 MCG tablet Take 1 tablet (50 mcg total) by mouth daily. 12/23/20  Yes Hosie Poisson, MD  metoprolol tartrate (LOPRESSOR) 25 MG tablet Take 12.5 mg by mouth 2 (two) times daily.  09/15/20  Yes [provider]  Multiple Vitamin (MULTIVITAMIN WITH MINERALS) TABS tablet Take 1 tablet by mouth daily. 12/24/20  Yes Hosie Poisson, MD  nutrition supplement, JUVEN, (JUVEN) PACK Take 1 packet by mouth 2 (two) times daily between meals. 12/23/20  Yes Hosie Poisson, MD  omeprazole (PRILOSEC) 20 MG capsule Take 20 mg by mouth daily.   Yes [provider]  pantoprazole (PROTONIX) 40 MG tablet Take 1 tablet (40 mg total) by mouth 2 (two) times daily. 11/12/20  Yes Ghimire, Dante Gang, MD  polyethylene glycol (MIRALAX / GLYCOLAX) 17 g packet Take 17 g by mouth daily. 12/24/20  Yes Hosie Poisson, MD  rosuvastatin (CRESTOR) 5 MG tablet Take 1 tablet (5 mg total) by mouth at bedtime. 11/07/20  Yes Swayze, Ava, DO  senna-docusate (SENOKOT-S) 8.6-50 MG tablet Take 2 tablets by mouth 2 (two) times daily as needed for mild constipation. 12/23/20  Yes Hosie Poisson, MD  sertraline (ZOLOFT) 25 MG tablet Take 1  tablet (25 mg total) by mouth daily. 10/06/20  Yes Nita Sells, MD  sodium bicarbonate 650 MG tablet Take 1 tablet (650 mg total) by mouth 2 (two) times daily. 10/06/20  Yes Nita Sells, MD  vitamin B-12 100 MCG tablet Take 1 tablet (100 mcg total) by mouth daily. 12/24/20  Yes Hosie Poisson, MD  acetaminophen (TYLENOL) 325 MG tablet Take 2 tablets (650 mg total) by mouth every 6 (six) hours as needed for mild pain (or Fever >/= 101). Patient taking differently: Take 650 mg by mouth every 6 (six) hours as needed for mild pain or fever. 10/06/20   Nita Sells, MD  atorvastatin (LIPITOR) 20 MG tablet Take 20 mg by mouth daily. 11/25/20   [provider]  collagenase (SANTYL) ointment Apply topically daily. 11/08/20   Swayze, Ava, DO  lisinopril (ZESTRIL) 5 MG tablet Take 5 mg by mouth daily. 12/03/20   [provider]  liver oil-zinc oxide (DESITIN) 40 % ointment Apply topically daily. 12/23/20   Hosie Poisson, MD  predniSONE (DELTASONE) 5 MG tablet Take 5 mg by mouth daily. 12/15/20  [provider]    Physical Exam: Vitals:   01/18/21 1916 01/18/21 2100 01/18/21 2130 01/18/21 2200  BP: (!) 80/45 (!) 102/51 (!) 95/54 113/60  Pulse:  100 97 96  Resp: 16 (!) 0 18 17  Temp: (!) 100.4 F (38 C)     TempSrc: Rectal     SpO2: 99% 97% 98% 99%  Weight:      Height:        Constitutional: Looks chronically ill.  Mildly febrile. Eyes: PERRL, lids and conjunctivae normal. ENMT: Mucous membranes are dry. Posterior pharynx clear of any exudate or lesions. Neck: normal, supple, no masses, no thyromegaly Respiratory: Decreased breath sounds in bases, otherwise clear to auscultation bilaterally, no wheezing, no crackles. Normal respiratory effort. No accessory muscle use.  Cardiovascular: Regular rate and rhythm, no murmurs / rubs / gallops.  1+ lower extremity bilateral pitting edema. 2+ pedal pulses. No carotid bruits.  Abdomen: No distention.   Bowel sounds positive.  Soft, no tenderness, no masses palpated. No hepatosplenomegaly. Musculoskeletal: Right BKA.  Severe generalized weakness.  No clubbing / cyanosis. Good ROM, no contractures. Normal muscle tone.  Skin: Stage IV sacral pressure ulcer with no significant discharge.  Please see picture below. Neurologic: Generalized weakness.  Does not follow commands. Psychiatric: Alert, at least oriented to name.  Unable to fully evaluate.    Labs on Admission: I have personally reviewed following labs and imaging studies  CBC: Recent Labs  Lab 01/12/21 1205 01/17/21 1857 01/18/21 1921  WBC 7.2 6.9 8.9  NEUTROABS 4.5 2.9 5.5  HGB 9.3* 8.9* 9.6*  HCT 28.5* 28.2* 29.6*  MCV 110.0* 112.8* 110.4*  PLT 350 283 543    Basic Metabolic Panel: Recent Labs  Lab 01/12/21 1205 01/17/21 1857 01/18/21 1921  NA 128* 133* 135  K 4.2 4.1 4.5  CL 92* 100 102  CO2 '25 24 23  ' GLUCOSE 421* 161* 87  BUN 70* 53* 49*  CREATININE 1.14* 1.26* 1.10*  CALCIUM 10.3 9.8 10.3  MG 1.7  --   --     GFR: Estimated Creatinine Clearance: 41.5 mL/min (A) (by C-G formula based on SCr of 1.1 mg/dL (H)).  Liver Function Tests: Recent Labs  Lab 01/18/21 1921  AST 18  ALT 11  ALKPHOS 120  BILITOT 0.7  PROT 5.7*  ALBUMIN 2.3*    Urine analysis:    Component Value Date/Time   COLORURINE YELLOW 01/18/2021 2045   APPEARANCEUR TURBID (A) 01/18/2021 2045   LABSPEC 1.014 01/18/2021 2045   PHURINE 6.0 01/18/2021 2045   GLUCOSEU NEGATIVE 01/18/2021 2045   HGBUR SMALL (A) 01/18/2021 2045   BILIRUBINUR NEGATIVE 01/18/2021 2045   KETONESUR 5 (A) 01/18/2021 2045   PROTEINUR >=300 (A) 01/18/2021 2045   NITRITE NEGATIVE 01/18/2021 2045   LEUKOCYTESUR LARGE (A) 01/18/2021 2045    Radiological Exams on Admission: CT Head Wo Contrast  Result Date: 01/18/2021 CLINICAL DATA:  60 year old female with altered mental status. EXAM: CT HEAD WITHOUT CONTRAST TECHNIQUE: Contiguous axial images were  obtained from the base of the skull through the vertex without intravenous contrast. COMPARISON:  Head CT dated 09/26/2020. FINDINGS: Brain: Mild age-related atrophy and moderate chronic microvascular ischemic changes. There is no acute intracranial hemorrhage. No mass effect midline shift. No extra-axial fluid collection. Vascular: No hyperdense vessel or unexpected calcification. Skull: Normal. Negative for fracture or focal lesion. Sinuses/Orbits: The visualized paranasal sinuses are clear. There is complete opacification of the mastoid air cells. Other: None IMPRESSION: 1. No acute  intracranial pathology. 2. Age-related atrophy and chronic microvascular ischemic changes. 3. Bilateral mastoid effusions. Electronically Signed   By: Anner Crete M.D.   On: 01/18/2021 22:01   DG Chest Port 1 View  Result Date: 01/18/2021 CLINICAL DATA:  60 year old female with sepsis. EXAM: PORTABLE CHEST 1 VIEW COMPARISON:  Chest radiograph dated 12/25/2020. FINDINGS: Right-sided PICC with tip at the cavoatrial junction. No focal consolidation, pleural effusion or pneumothorax. Borderline cardiomegaly. No acute osseous pathology. Osteopenia. IMPRESSION: No acute cardiopulmonary process. Electronically Signed   By: Anner Crete M.D.   On: 01/18/2021 20:57    EKG: Independently reviewed.  Assessment/Plan Principal Problem:   SIRS (systemic inflammatory response syndrome POA) (HCC) Not clear what is the source of infection as pressure ulcer does not look infected. Placing observation/telemetry unit. Responded well to fluids, but likely volume depleted. Continue gentle IV hydration. Continue vancomycin and Zosyn per pharmacy. Follow blood culture and sensitivity. Follow CBC and CMP.  Active Problems:   Sacral decubitus ulcer, stage IV ([place]) Continue local care. Consult wound and ostomy care. Continue vancomycin per pharmacy. Continue Zosyn per pharmacy. Analgesics as needed.    Hypotension Secondary  to dehydration. Resolved with IV fluids.    Hypertension Continue metoprolol 12.5 mg p.o. twice daily in a.m. Monitor blood pressure and heart rate.    Hypoalbuminemia Consult nutritional services. Protein supplementation.    Hypothyroid Continue Synthroid 50 mcg p.o. daily.    Failure to thrive in adult Consult TOC. Consider PT evaluation.    DVT prophylaxis: Lovenox SQ. Code Status:   Full code. Family Communication: Disposition Plan:   Patient is from:  [place] home.  Anticipated DC to:  TBD.  Anticipated DC date:  01/21/2021.  Anticipated DC barriers: Clinical status.  Consults called:  TOC team.  Wound care. Admission status:  Observation/telemetry.  Severity of Illness:  High due to fever with a history of sacral pressure ulcer, with ongoing treatment for sacral osteomyelitis who is coming to the emergency department with fever and AMS.  The patient will need to remain for IV antibiotic therapy and further work-up.  Reubin Milan MD [facility]  How to contact the [facility] Attending or Consulting provider [place] or covering provider during after hours [place], for this patient?   1. Check the care team in [facility] and look for a) attending/consulting TRH provider listed and b) the [facility] team listed 2. Log into [email or web address] and use [place]'s universal password to access. If you do not have the password, please contact the hospital operator. 3. Locate the [facility] provider you are looking for under [facility] and page to a number that you can be directly reached. 4. If you still have difficulty reaching the provider, please page the [facility] (Director on Call) for the Hospitalists listed on amion for assistance.  01/18/2021, 10:57 PM   This document was prepared using Dragon voice recognition software and may contain some unintended transcription errors.

## 2021-01-18 NOTE — ED Notes (Signed)
Patient transported to [place] for [facility]

## 2021-01-19 DIAGNOSIS — E8809 Other disorders of plasma-protein metabolism, not elsewhere classified: Secondary | ICD-10-CM

## 2021-01-19 DIAGNOSIS — A419 Sepsis, unspecified organism: Secondary | ICD-10-CM

## 2021-01-19 DIAGNOSIS — R627 Adult failure to thrive: Secondary | ICD-10-CM

## 2021-01-19 DIAGNOSIS — L89154 Pressure ulcer of sacral region, stage 4: Secondary | ICD-10-CM

## 2021-01-19 LAB — CBC WITH DIFFERENTIAL/PLATELET
Abs Immature Granulocytes: 0.05 10*3/uL (ref 0.00–0.07)
Basophils Absolute: 0 10*3/uL (ref 0.0–0.1)
Basophils Relative: 1 %
Eosinophils Absolute: 0.1 10*3/uL (ref 0.0–0.5)
Eosinophils Relative: 1 %
HCT: 28.5 % — ABNORMAL LOW (ref 36.0–46.0)
Hemoglobin: 8.8 g/dL — ABNORMAL LOW (ref 12.0–15.0)
Immature Granulocytes: 1 %
Lymphocytes Relative: 25 %
Lymphs Abs: 2.1 10*3/uL (ref 0.7–4.0)
MCH: 35.2 pg — ABNORMAL HIGH (ref 26.0–34.0)
MCHC: 30.9 g/dL (ref 30.0–36.0)
MCV: 114 fL — ABNORMAL HIGH (ref 80.0–100.0)
Monocytes Absolute: 0.7 10*3/uL (ref 0.1–1.0)
Monocytes Relative: 9 %
Neutro Abs: 5.4 10*3/uL (ref 1.7–7.7)
Neutrophils Relative %: 63 %
Platelets: 266 10*3/uL (ref 150–400)
RBC: 2.5 MIL/uL — ABNORMAL LOW (ref 3.87–5.11)
RDW: 21.2 % — ABNORMAL HIGH (ref 11.5–15.5)
WBC: 8.4 10*3/uL (ref 4.0–10.5)
nRBC: 0 % (ref 0.0–0.2)

## 2021-01-19 LAB — COMPREHENSIVE METABOLIC PANEL
ALT: 10 U/L (ref 0–44)
AST: 16 U/L (ref 15–41)
Albumin: 2 g/dL — ABNORMAL LOW (ref 3.5–5.0)
Alkaline Phosphatase: 114 U/L (ref 38–126)
Anion gap: 14 (ref 5–15)
BUN: 50 mg/dL — ABNORMAL HIGH (ref 8–23)
CO2: 18 mmol/L — ABNORMAL LOW (ref 22–32)
Calcium: 10 mg/dL (ref 8.9–10.3)
Chloride: 101 mmol/L (ref 98–111)
Creatinine, Ser: 1.22 mg/dL — ABNORMAL HIGH (ref 0.44–1.00)
GFR, Estimated: 50 mL/min — ABNORMAL LOW (ref >60)
Glucose, Bld: 275 mg/dL — ABNORMAL HIGH (ref 70–99)
Potassium: 4.9 mmol/L (ref 3.5–5.1)
Sodium: 133 mmol/L — ABNORMAL LOW (ref 135–145)
Total Bilirubin: 1.5 mg/dL — ABNORMAL HIGH (ref 0.3–1.2)
Total Protein: 5.2 g/dL — ABNORMAL LOW (ref 6.5–8.1)

## 2021-01-19 LAB — CBG MONITORING, ED
Glucose-Capillary: 143 mg/dL — ABNORMAL HIGH (ref 70–99)
Glucose-Capillary: 210 mg/dL — ABNORMAL HIGH (ref 70–99)
Glucose-Capillary: 257 mg/dL — ABNORMAL HIGH (ref 70–99)
Glucose-Capillary: 321 mg/dL — ABNORMAL HIGH (ref 70–99)

## 2021-01-19 MED ORDER — ROSUVASTATIN CALCIUM 10 MG PO TABS
5.0000 mg | ORAL_TABLET | Freq: Every day | ORAL | Status: DC
  Administered 2021-01-19 – 2021-01-23 (×5): 5 mg via ORAL
  Filled 2021-01-19 (×5): qty 1

## 2021-01-19 MED ORDER — ALBUMIN HUMAN 25 % IV SOLN: 25.0000 g | Freq: Two times a day (BID) | INTRAVENOUS | Status: DC

## 2021-01-19 MED ORDER — CYCLOSPORINE 25 MG PO CAPS
75.0000 mg | ORAL_CAPSULE | Freq: Two times a day (BID) | ORAL | Status: DC
  Administered 2021-01-19 – 2021-01-24 (×10): 75 mg via ORAL
  Filled 2021-01-19 (×16): qty 3

## 2021-01-19 MED ORDER — METOPROLOL TARTRATE 25 MG PO TABS
12.5000 mg | ORAL_TABLET | Freq: Two times a day (BID) | ORAL | Status: DC
  Administered 2021-01-19: 12.5 mg via ORAL
  Filled 2021-01-19: qty 1

## 2021-01-19 MED ORDER — SODIUM BICARBONATE 650 MG PO TABS
650.0000 mg | ORAL_TABLET | Freq: Two times a day (BID) | ORAL | Status: DC
  Administered 2021-01-19 – 2021-01-24 (×11): 650 mg via ORAL
  Filled 2021-01-19 (×11): qty 1

## 2021-01-19 MED ORDER — POLYETHYLENE GLYCOL 3350 17 G PO PACK
17.0000 g | PACK | Freq: Every day | ORAL | Status: DC
  Administered 2021-01-20 – 2021-01-24 (×3): 17 g via ORAL
  Filled 2021-01-19 (×6): qty 1

## 2021-01-19 MED ORDER — HYDROCODONE-ACETAMINOPHEN 5-325 MG PO TABS
1.0000 | ORAL_TABLET | ORAL | Status: DC | PRN
Start: 2021-01-19 — End: 2021-01-20
  Administered 2021-01-19 (×3): 1 via ORAL
  Filled 2021-01-19 (×3): qty 1

## 2021-01-19 MED ORDER — ZINC OXIDE 40 % EX OINT
TOPICAL_OINTMENT | CUTANEOUS | Status: DC
  Filled 2021-01-19 (×2): qty 57

## 2021-01-19 MED ORDER — PREDNISONE 5 MG PO TABS
5.0000 mg | ORAL_TABLET | Freq: Every day | ORAL | Status: DC
  Administered 2021-01-19 – 2021-01-24 (×6): 5 mg via ORAL
  Filled 2021-01-19 (×7): qty 1

## 2021-01-19 MED ORDER — CYCLOSPORINE MODIFIED (NEORAL) 25 MG PO CAPS
ORAL_CAPSULE | ORAL | Status: AC
  Filled 2021-01-19: qty 3

## 2021-01-19 MED ORDER — ALBUMIN HUMAN 25 % IV SOLN
25.0000 g | Freq: Two times a day (BID) | INTRAVENOUS | Status: AC
  Administered 2021-01-19 (×2): 12.5 g via INTRAVENOUS
  Administered 2021-01-20 (×2): 25 g via INTRAVENOUS
  Filled 2021-01-19 (×6): qty 100

## 2021-01-19 MED ORDER — SERTRALINE HCL 50 MG PO TABS
25.0000 mg | ORAL_TABLET | Freq: Every day | ORAL | Status: DC
  Administered 2021-01-19 – 2021-01-24 (×6): 25 mg via ORAL
  Filled 2021-01-19 (×7): qty 1

## 2021-01-19 MED ORDER — SENNOSIDES-DOCUSATE SODIUM 8.6-50 MG PO TABS: 2.0000 | ORAL_TABLET | Freq: Two times a day (BID) | ORAL | Status: DC | PRN

## 2021-01-19 MED ORDER — FOLIC ACID 1 MG PO TABS
1.0000 mg | ORAL_TABLET | Freq: Every day | ORAL | Status: DC
  Administered 2021-01-19 – 2021-01-24 (×6): 1 mg via ORAL
  Filled 2021-01-19 (×7): qty 1

## 2021-01-19 MED ORDER — AZATHIOPRINE 50 MG PO TABS
50.0000 mg | ORAL_TABLET | Freq: Every day | ORAL | Status: DC
  Administered 2021-01-19 – 2021-01-24 (×6): 50 mg via ORAL
  Filled 2021-01-19 (×8): qty 1

## 2021-01-19 MED ORDER — LEVOTHYROXINE SODIUM 50 MCG PO TABS
50.0000 ug | ORAL_TABLET | Freq: Every day | ORAL | Status: DC
  Administered 2021-01-19 – 2021-01-23 (×5): 50 ug via ORAL
  Filled 2021-01-19 (×5): qty 1

## 2021-01-19 MED ORDER — COLLAGENASE 250 UNIT/GM EX OINT
TOPICAL_OINTMENT | Freq: Every day | CUTANEOUS | Status: DC
  Filled 2021-01-19 (×3): qty 30

## 2021-01-19 MED ORDER — ALBUMIN HUMAN 25 % IV SOLN
INTRAVENOUS | Status: AC
  Filled 2021-01-19: qty 100

## 2021-01-19 MED ORDER — INSULIN GLARGINE 100 UNIT/ML ~~LOC~~ SOLN
4.0000 [IU] | Freq: Every day | SUBCUTANEOUS | Status: DC
  Administered 2021-01-19 – 2021-01-22 (×4): 4 [IU] via SUBCUTANEOUS
  Filled 2021-01-19 (×5): qty 0.04

## 2021-01-19 MED ORDER — VITAMIN B-12 100 MCG PO TABS
100.0000 ug | ORAL_TABLET | Freq: Every day | ORAL | Status: DC
  Administered 2021-01-19 – 2021-01-24 (×6): 100 ug via ORAL
  Filled 2021-01-19 (×6): qty 1

## 2021-01-19 MED ORDER — INSULIN ASPART 100 UNIT/ML ~~LOC~~ SOLN
0.0000 [IU] | Freq: Three times a day (TID) | SUBCUTANEOUS | Status: DC
  Administered 2021-01-19: 3 [IU] via SUBCUTANEOUS
  Administered 2021-01-19: 5 [IU] via SUBCUTANEOUS
  Administered 2021-01-19: 1 [IU] via SUBCUTANEOUS
  Administered 2021-01-20 (×2): 5 [IU] via SUBCUTANEOUS
  Administered 2021-01-20: 9 [IU] via SUBCUTANEOUS
  Administered 2021-01-21: 7 [IU] via SUBCUTANEOUS
  Administered 2021-01-21: 9 [IU] via SUBCUTANEOUS
  Administered 2021-01-21: 3 [IU] via SUBCUTANEOUS
  Administered 2021-01-22: 2 [IU] via SUBCUTANEOUS
  Administered 2021-01-22: 5 [IU] via SUBCUTANEOUS
  Administered 2021-01-22 – 2021-01-23 (×3): 9 [IU] via SUBCUTANEOUS
  Filled 2021-01-19 (×4): qty 1

## 2021-01-19 MED ORDER — PANTOPRAZOLE SODIUM 40 MG PO TBEC
40.0000 mg | DELAYED_RELEASE_TABLET | Freq: Every day | ORAL | Status: DC
  Administered 2021-01-19 – 2021-01-21 (×3): 40 mg via ORAL
  Filled 2021-01-19 (×4): qty 1

## 2021-01-19 NOTE — TOC Initial Note (Signed)
Transition of Care [facility]) - Initial/Assessment Note    Patient Details  Name: [name] MRN: [id number] Date of Birth: [birth year]  Transition of Care [facility]) CM/SW Contact:    Natasha Bence, LCSW Phone Number: 01/19/2021, 5:14 PM  Clinical Narrative:                 Patient is a 60 year old female admitted for SIRS (systemic inflammatory response syndrome. Patient's husband reported that he would prefer for patient to go home with [facility]. CSW was able to obtain [facility] from [facility] for [facility] antibotics. Patient also requires wound care. CSW contacted [facility], [facility], [facility], [facility] and [facility] to inquire if they are able to take patient for [facility] and wound care. All HH agencies reported that they are not able to take patient. CSW contacted [facility] to inquire if patient would be able to receive OP wound care. [facility] reported that their next appt would not be until 02/24. CSW contacted [facility] for [facility] at [phone]. Facility not able to take patient until 02/04. Patient's husband did not want to proceed with scheduling wound care due to uncertainty of discharge date. Concerns were expressed about the infection progressing due to [place] not redressing wound. Patient is currently active with [facility]. Pam with [facility] reported that Jackelyn Poling had been working to set up [facility] for patient to discharge home but was not successful. TOC to follow.  Expected Discharge Plan: [place] Barriers to Discharge: Continued Medical Work up   Patient Goals and CMS Choice Patient states their goals for this hospitalization and ongoing recovery are:: Return home with [facility] CMS Medicare.gov Compare Post Acute Care list provided to:: Patient Choice offered to / list presented to : [facility]  Expected Discharge Plan and Services Expected Discharge Plan: [place] In-house Referral: NA Discharge  Planning Services: NA   Living arrangements for the past 2 months: Skilled [place]                 DME Arranged: N/A DME Agency: NA       HH Arranged: IV Antibiotics,RN HH Agency: Other - See comment ([facility] and OP Wound Care with [place])        Prior Living Arrangements/Services Living arrangements for the past 2 months: [place] Lives with:: Self,Spouse Patient language and need for interpreter reviewed:: Yes Do you feel safe going back to the place where you live?: Yes      Need for Family Participation in Patient Care: Yes (Comment) Care giver support system in place?: Yes (comment)   Criminal Activity/Legal Involvement Pertinent to Current Situation/Hospitalization: No - Comment as needed  Activities of Daily Living      Permission Sought/Granted Permission sought to share information with : Family Supports Permission granted to share information with : Yes, Verbal Permission Granted  Share Information with NAME: Ife Crapser  Permission granted to share info w AGENCY: [facility]. OP Wound care  Permission granted to share info w Relationship: Spouse  Permission granted to share info w Contact Information: [phone]  Emotional Assessment     Affect (typically observed): Accepting,Adaptable Orientation: : Oriented to Self,Oriented to Place,Oriented to  Time,Oriented to Situation Alcohol / Substance Use: Not Applicable Psych Involvement: No (comment)  Admission diagnosis:  SIRS (systemic inflammatory response syndrome) (HCC) [R65.10] Patient Active Problem List   Diagnosis Date Noted  . SIRS (systemic  inflammatory response syndrome) ([place]) 01/18/2021  . Malnutrition of moderate degree 01/07/2021  . Acute osteomyelitis of sacrum ([place])   . Failure to thrive in adult   . Unspecified severe protein-calorie malnutrition ([name]) 12/10/2020  . Acute metabolic encephalopathy  [id number]  . Sacral decubitus ulcer, stage IV ([place]) 12/08/2020  . Cellulitis 12/08/2020  . Hypotension 12/08/2020  . Malodorous urine 12/08/2020  . Erosive esophagitis 12/08/2020  . Hypothyroid 12/08/2020  . Depression 12/08/2020  . Type 1 diabetes mellitus with hyperlipidemia ([place]) 12/08/2020  . Sepsis ([place]) 12/07/2020  . Hypoalbuminemia 12/07/2020  . Hyponatremia 12/07/2020  . CAP (community acquired pneumonia) 12/07/2020  . Acute urinary retention 12/07/2020  . Coffee ground emesis   . Gastroesophageal reflux disease with esophagitis and hemorrhage   . Long-term use of immunosuppressant medication   . GI bleed 11/08/2020  . AKI (acute kidney injury) ([place]) 10/29/2020  . Gangrene of right foot ([place])   . Subacute osteomyelitis of right foot ([place])   . Type 1 diabetes mellitus with complication, with long term current use of insulin pump ([place]) 09/27/2020  . Pressure ulcer of BKA stump ([place]) 09/27/2020  . Diabetic ketoacidosis ([place]) 09/26/2020  . Acute kidney injury superimposed on CKD ([name]) 09/26/2020  . Cellulitis and abscess of right leg 09/26/2020  . 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 S/p cadaver renal transplant 05/14/2020  . Chronic kidney disease (CKD) stage G3a/A1, moderately decreased glomerular filtration rate (GFR) between 45-59 mL/min/1.73 square meter and albuminuria creatinine ratio less than 30 mg/g (HCC) 05/14/2020  . Hypertension 05/14/2020   PCP:  [name], MD Pharmacy:   [id number] [place] [id number] [id number]: [phone]: [phone]    Social Determinants of Health (SDOH) Interventions    Readmission Risk Interventions No flowsheet data found.

## 2021-01-19 NOTE — Consult Note (Signed)
[facility] Nurse Consult Note: Reason for Consult:Patient is well known to our department from pervious admissions.  Last seen 3 weeks ago by my partner, M. Austin. The patient initially had a wound with gross contamination and necrotic tissue but underwent a course of therapy with collagenase plus hydrotherapy and achieved maximum benefit from that intervention. Photo taken of the wound yesterday in the ED is appreciated and the wound is currently free of necrotic tissue, however a large defect remains. Wound type:Pressure Pressure Injury POA: Yes Measurement:To be obtained by Bedside RN prior to first dressing change today and entered into the Nursing Flow Sheet Wound bed: Red, moist, undermining is noted.  Drainage (amount, consistency, odor) Moderate amount serous exudate Periwound: erythema with moisture associated skin damage. Will use Desitin while in house to clear the periwound erythema  Dressing procedure/placement/frequency: I have provided Nursing with guidance for the care of the Sacral Stage 4 pressure injury as well as the POC for moisture associated skin damage: both periwound contact irritant dermatitis and contact dermatitis related to incontinence.Wound care orders are as follows:  1. Clean sacral wound with saline; pat dry 2. Apply 1 sheet of Aquacel Advantage silver hydrofiber Kellie Simmering [phone]) to wound daily, using cotton tipped applicator to tuck underneath wound edges and fill space below skin level which surround the wound.  3.  Cover Aquacel Advantage with dry gauze 4x4s 4. Apply Desitin ointment to outer red wound edges, then cover with foam dressing. 5. OK to lift foam to repack daily and Change foam dressings Q 3 days or PRN soiling  Additionally, a mattress replacement with low air loss feature is requested via the Orders.  A right heel boots is ordered as well.  Turning and repositioning is in place and will continue. Desitin ointment will be used in the periwound area and  additionally in the perineal area, medial and posterior thighs post cleansing after incontinence episodes.  [facility] nursing team will not follow, but will remain available to this patient, the nursing and medical teams.  Please re-consult if needed. Thanks, Maudie Flakes, MSN, RN, [place], Arther Abbott  Pager# [phone]

## 2021-01-19 NOTE — Progress Notes (Signed)
PROGRESS NOTE    [name]  [id number] DOB: 1961/04/18 DOA: 01/18/2021 PCP: [name], MD    Brief Narrative:  60 year old female with history of type 1 diabetes, status post kidney and pancreatic transplant, hypothyroidism, right below the knee potation in 09/2020, was recently admitted to the hospital for sacral osteomyelitis.  She was discharged to skilled nursing facility on intravenous antibiotics.  She returns to the hospital with confusion/lethargy.  She was noted to be mildly febrile and had low blood pressure.  Started on broad-spectrum antibiotics and IV fluids.   Assessment & Plan:   Principal Problem:   SIRS (systemic inflammatory response syndrome) (HCC) Active Problems:   Hypertension   Hypoalbuminemia   Sacral decubitus ulcer, stage IV (HCC)   Hypotension   Hypothyroid   Failure to thrive in adult   SIRS -It is unclear whether she has an active source of infection at this point -She does have osteomyelitis from sacral decubitus ulcer, although she has been on IV antibiotics since her last discharge -Patient had hypotension and mild fever on admission -Since she has a chronic PICC line, we would need to rule out bacteremia -Blood cultures are in process -Overall hemodynamics have improved with IV fluids -She is placed on broad-spectrum antibiotics for now.  Stage IV sacral decubitus ulcer, present on admission, with underlying osteomyelitis -Appreciate wound care input -Continue on IV antibiotics -If blood cultures are negative, will transition back to Rocephin/doxycycline -With her hypoalbuminemia, it would be very challenging for her wound to heal  Possible UTI -Urine culture in process -Continue antibiotics  Hypotension -Improved with IV hydration  Hypertension -Holding metoprolol for now  Hypothyroidism -Continue on Synthroid  Chronic kidney disease stage III status post renal transplant -Creatinine appears to be near  baseline -Continue to follow with hydration -Continue immunosuppressive's  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 -A1c of 7.2 -Continued on sliding scale insulin and Lantus -Blood sugars have been stable  Goals of care -With poor functional nutritional status, long-term prognosis is poor -Palliative care consulted to assist with goals of care   DVT prophylaxis: enoxaparin (LOVENOX) injection 40 mg Start: 01/18/21 2300  Code Status: Full code Family Communication: Discussed with husband over the phone 1/24 Disposition Plan: Status is: Inpatient  Remains inpatient appropriate because:IV treatments appropriate due to intensity of illness or inability to take PO   Dispo: The patient is from: SNF              Anticipated d/c is to: SNF              Anticipated d/c date is: 2 days              Patient currently is not medically stable to d/c.   Difficult to place patient No   Consultants:   Palliative care  Procedures:     Antimicrobials:   Vancomycin 1/23 >  Zosyn 1/23 >   Subjective: She complains of pain in her sacral area.  Denies any shortness of breath or cough  Objective: Vitals:   01/19/21 1030 01/19/21 1400 01/19/21 1533 01/19/21 1830  BP: (!) 105/54 (!) 119/99 126/61 115/62  Pulse: 81 70 67 73  Resp: '13 12 16 11  '$ Temp:   98.1 F (36.7 C)   TempSrc:   Oral   SpO2: 98% 98% 99% 98%  Weight:      Height:        Intake/Output Summary (Last 24 hours) at 01/19/2021 1941 Last data filed  at 01/19/2021 0646 Gross per 24 hour  Intake 2249.9 ml  Output 1150 ml  Net 1099.9 ml   Filed Weights   01/18/21 1849  Weight: 49 kg    Examination:  General exam: Appears calm and comfortable  Respiratory system: Clear to auscultation. Respiratory effort normal. Cardiovascular system: S1 & S2 heard, RRR. No JVD, murmurs, rubs, gallops or clicks. No pedal edema. Gastrointestinal system: Abdomen is nondistended, soft and nontender. No organomegaly or  masses felt. Normal bowel sounds heard. Central nervous system: Alert and oriented. No focal neurological deficits. Extremities: Symmetric 5 x 5 power. Skin: Large sacral wound Psychiatry: Judgement and insight appear normal. Mood & affect appropriate.     Data Reviewed: I have personally reviewed following labs and imaging studies  CBC: Recent Labs  Lab 01/17/21 1857 01/18/21 1921 01/19/21 0651  WBC 6.9 8.9 8.4  NEUTROABS 2.9 5.5 5.4  HGB 8.9* 9.6* 8.8*  HCT 28.2* 29.6* 28.5*  MCV 112.8* 110.4* 114.0*  PLT 283 299 [id number]   Basic Metabolic Panel: Recent Labs  Lab 01/17/21 1857 01/18/21 1921 01/19/21 0651  NA 133* 135 133*  K 4.1 4.5 4.9  CL 100 102 101  CO2 24 23 18*  GLUCOSE 161* 87 275*  BUN 53* 49* 50*  CREATININE 1.26* 1.10* 1.22*  CALCIUM 9.8 10.3 10.0   GFR: Estimated Creatinine Clearance: 37.5 mL/min (A) (by C-G formula based on SCr of 1.22 mg/dL (H)). Liver Function Tests: Recent Labs  Lab 01/18/21 1921 01/19/21 0651  AST 18 16  ALT 11 10  ALKPHOS 120 114  BILITOT 0.7 1.5*  PROT 5.7* 5.2*  ALBUMIN 2.3* 2.0*   No results for input(s): LIPASE, AMYLASE in the last 168 hours. No results for input(s): AMMONIA in the last 168 hours. Coagulation Profile: Recent Labs  Lab 01/18/21 1921  INR 1.3*   Cardiac Enzymes: No results for input(s): CKTOTAL, CKMB, CKMBINDEX, TROPONINI in the last 168 hours. BNP (last 3 results) No results for input(s): PROBNP in the last 8760 hours. HbA1C: No results for input(s): HGBA1C in the last 72 hours. CBG: Recent Labs  Lab 01/19/21 0827 01/19/21 1158 01/19/21 1839  GLUCAP 257* 210* 143*   Lipid Profile: No results for input(s): CHOL, HDL, LDLCALC, TRIG, CHOLHDL, LDLDIRECT in the last 72 hours. Thyroid Function Tests: No results for input(s): TSH, T4TOTAL, FREET4, T3FREE, THYROIDAB in the last 72 hours. Anemia Panel: No results for input(s): VITAMINB12, FOLATE, FERRITIN, TIBC, IRON, RETICCTPCT in the last 72  hours. Sepsis Labs: Recent Labs  Lab 01/18/21 1921  LATICACIDVEN 1.2    Recent Results (from the past 240 hour(s))  Urine culture     Status: Abnormal   Collection Time: 01/12/21 10:55 AM   Specimen: Urine, Catheterized  Result Value Ref Range Status   Specimen Description   Final    URINE, CATHETERIZED Performed at [facility], [address] [place], [place] [address]    Special Requests   Final    NONE Performed at [facility], [address], [place], [place] [address]    Culture 70,000 COLONIES/mL YEAST (A)  Final   Report Status 01/13/2021 FINAL  Final  Blood culture (routine single)     Status: None (Preliminary result)   Collection Time: 01/18/21  7:58 PM   Specimen: Left Antecubital; Blood  Result Value Ref Range Status   Specimen Description LEFT ANTECUBITAL  Final   Special Requests   Final    BOTTLES DRAWN AEROBIC AND ANAEROBIC Blood Culture adequate volume  Culture   Final    NO GROWTH < 12 HOURS Performed at [facility], [address] [place], [place] [address]    Report Status PENDING  Incomplete  SARS Coronavirus 2 by RT PCR (hospital order, performed in [facility] hospital lab) Nasopharyngeal Nasopharyngeal Swab     Status: None   Collection Time: 01/18/21  9:26 PM   Specimen: Nasopharyngeal Swab  Result Value Ref Range Status   SARS Coronavirus 2 NEGATIVE NEGATIVE Final    Comment: (NOTE) SARS-CoV-2 target nucleic acids are NOT DETECTED.  The SARS-CoV-2 RNA is generally detectable in upper and lower respiratory specimens during the acute phase of infection. The lowest concentration of SARS-CoV-2 viral copies this assay can detect is 250 copies / mL. A negative result does not preclude SARS-CoV-2 infection and should not be used as the sole basis for treatment or other patient management decisions.  A negative result may occur with improper specimen collection / handling, submission of specimen other than nasopharyngeal swab, presence of viral  mutation(s) within the areas targeted by this assay, and inadequate number of viral copies (<250 copies / mL). A negative result must be combined with clinical observations, patient history, and epidemiological information.  Fact Sheet for Patients:   StrictlyIdeas.no  Fact Sheet for Healthcare Providers: BankingDealers.co.za  This test is not yet approved or  cleared by the [place] FDA and has been authorized for detection and/or diagnosis of SARS-CoV-2 by FDA under an Emergency Use Authorization (EUA).  This EUA will remain in effect (meaning this test can be used) for the duration of the COVID-19 declaration under Section 564(b)(1) of the Act, 21 U.S.C. section 360bbb-3(b)(1), unless the authorization is terminated or revoked sooner.  Performed at [facility], [address] [place], [place] [address]   Culture, blood (single)     Status: None (Preliminary result)   Collection Time: 01/18/21 11:18 PM   Specimen: Left Antecubital; Blood  Result Value Ref Range Status   Specimen Description LEFT ANTECUBITAL  Final   Special Requests   Final    BOTTLES DRAWN AEROBIC AND ANAEROBIC Blood Culture adequate volume   Culture   Final    NO GROWTH < 12 HOURS Performed at [facility], [address] [place], [place] [address]    Report Status PENDING  Incomplete         Radiology Studies: CT Head Wo Contrast  Result Date: 01/18/2021 CLINICAL DATA:  60 year old female with altered mental status. EXAM: CT HEAD WITHOUT CONTRAST TECHNIQUE: Contiguous axial images were obtained from the base of the skull through the vertex without intravenous contrast. COMPARISON:  Head CT dated 09/26/2020. FINDINGS: Brain: Mild age-related atrophy and moderate chronic microvascular ischemic changes. There is no acute intracranial hemorrhage. No mass effect midline shift. No extra-axial fluid collection. Vascular: No hyperdense vessel or unexpected  calcification. Skull: Normal. Negative for fracture or focal lesion. Sinuses/Orbits: The visualized paranasal sinuses are clear. There is complete opacification of the mastoid air cells. Other: None IMPRESSION: 1. No acute intracranial pathology. 2. Age-related atrophy and chronic microvascular ischemic changes. 3. Bilateral mastoid effusions. Electronically Signed   By: Anner Crete M.D.   On: 01/18/2021 22:01   DG Chest Port 1 View  Result Date: 01/18/2021 CLINICAL DATA:  60 year old female with sepsis. EXAM: PORTABLE CHEST 1 VIEW COMPARISON:  Chest radiograph dated 12/25/2020. FINDINGS: Right-sided PICC with tip at the cavoatrial junction. No focal consolidation, pleural effusion or pneumothorax. Borderline cardiomegaly. No acute osseous pathology. Osteopenia. IMPRESSION: No  acute cardiopulmonary process. Electronically Signed   By: Anner Crete M.D.   On: 01/18/2021 20:57        Scheduled Meds:  azaTHIOprine  50 mg Oral Daily   collagenase   Topical Daily   cycloSPORINE  75 mg Oral BID   enoxaparin (LOVENOX) injection  40 mg Subcutaneous [id number]   folic acid  1 mg Oral Daily   insulin aspart  0-9 Units Subcutaneous TID WC   insulin glargine  4 Units Subcutaneous Daily   levothyroxine  50 mcg Oral Daily   liver oil-zinc oxide   Topical See admin instructions   metoprolol tartrate  12.5 mg Oral BID   pantoprazole  40 mg Oral Daily   polyethylene glycol  17 g Oral Daily   predniSONE  5 mg Oral Daily   rosuvastatin  5 mg Oral QHS   sertraline  25 mg Oral Daily   sodium bicarbonate  650 mg Oral BID   cyanocobalamin  100 mcg Oral Daily   Continuous Infusions:  albumin human     lactated ringers 75 mL/hr at 01/19/21 0646   piperacillin-tazobactam (ZOSYN)  IV 3.375 g (01/19/21 1342)   vancomycin       LOS: 1 day    Time spent: 39mns    JKathie Dike MD [facility]   If 7PM-7AM, please contact night-coverage [email or web address]  01/19/2021,  7:41 PM

## 2021-01-19 NOTE — ED Notes (Signed)
Pericare performed, Mepilex sacral dressing applied after sacral wound cleansed and patted dry.

## 2021-01-20 ENCOUNTER — Other Ambulatory Visit: Payer: Self-pay

## 2021-01-20 ENCOUNTER — Encounter (HOSPITAL_COMMUNITY): Payer: Self-pay | Admitting: Internal Medicine

## 2021-01-20 DIAGNOSIS — Z7189 Other specified counseling: Secondary | ICD-10-CM

## 2021-01-20 DIAGNOSIS — Z515 Encounter for palliative care: Secondary | ICD-10-CM

## 2021-01-20 LAB — CBC WITH DIFFERENTIAL/PLATELET
Abs Immature Granulocytes: 0.05 10*3/uL (ref 0.00–0.07)
Basophils Absolute: 0 10*3/uL (ref 0.0–0.1)
Basophils Relative: 1 %
Eosinophils Absolute: 0.1 10*3/uL (ref 0.0–0.5)
Eosinophils Relative: 1 %
HCT: 23.7 % — ABNORMAL LOW (ref 36.0–46.0)
Hemoglobin: 7.4 g/dL — ABNORMAL LOW (ref 12.0–15.0)
Immature Granulocytes: 1 %
Lymphocytes Relative: 21 %
Lymphs Abs: 1.8 10*3/uL (ref 0.7–4.0)
MCH: 35.9 pg — ABNORMAL HIGH (ref 26.0–34.0)
MCHC: 31.2 g/dL (ref 30.0–36.0)
MCV: 115 fL — ABNORMAL HIGH (ref 80.0–100.0)
Monocytes Absolute: 0.7 10*3/uL (ref 0.1–1.0)
Monocytes Relative: 9 %
Neutro Abs: 5.7 10*3/uL (ref 1.7–7.7)
Neutrophils Relative %: 67 %
Platelets: 253 10*3/uL (ref 150–400)
RBC: 2.06 MIL/uL — ABNORMAL LOW (ref 3.87–5.11)
RDW: 21.3 % — ABNORMAL HIGH (ref 11.5–15.5)
WBC: 8.4 10*3/uL (ref 4.0–10.5)
nRBC: 0 % (ref 0.0–0.2)

## 2021-01-20 LAB — COMPREHENSIVE METABOLIC PANEL
ALT: 8 U/L (ref 0–44)
AST: 13 U/L — ABNORMAL LOW (ref 15–41)
Albumin: 2.3 g/dL — ABNORMAL LOW (ref 3.5–5.0)
Alkaline Phosphatase: 86 U/L (ref 38–126)
Anion gap: 12 (ref 5–15)
BUN: 52 mg/dL — ABNORMAL HIGH (ref 8–23)
CO2: 21 mmol/L — ABNORMAL LOW (ref 22–32)
Calcium: 9.6 mg/dL (ref 8.9–10.3)
Chloride: 101 mmol/L (ref 98–111)
Creatinine, Ser: 1.45 mg/dL — ABNORMAL HIGH (ref 0.44–1.00)
GFR, Estimated: 41 mL/min — ABNORMAL LOW (ref >60)
Glucose, Bld: 355 mg/dL — ABNORMAL HIGH (ref 70–99)
Potassium: 4.7 mmol/L (ref 3.5–5.1)
Sodium: 134 mmol/L — ABNORMAL LOW (ref 135–145)
Total Bilirubin: 1 mg/dL (ref 0.3–1.2)
Total Protein: 5.1 g/dL — ABNORMAL LOW (ref 6.5–8.1)

## 2021-01-20 LAB — CBG MONITORING, ED: Glucose-Capillary: 354 mg/dL — ABNORMAL HIGH (ref 70–99)

## 2021-01-20 LAB — URINE CULTURE: Culture: >=100000 — AB

## 2021-01-20 LAB — GLUCOSE, CAPILLARY
Glucose-Capillary: 286 mg/dL — ABNORMAL HIGH (ref 70–99)
Glucose-Capillary: 291 mg/dL — ABNORMAL HIGH (ref 70–99)
Glucose-Capillary: 263 mg/dL — ABNORMAL HIGH (ref 70–99)

## 2021-01-20 MED ORDER — HYDROCODONE-ACETAMINOPHEN 5-325 MG PO TABS
1.5000 | ORAL_TABLET | ORAL | Status: DC | PRN
  Administered 2021-01-20 – 2021-01-24 (×10): 1.5 via ORAL
  Filled 2021-01-20 (×10): qty 2

## 2021-01-20 MED ORDER — INSULIN ASPART 100 UNIT/ML ~~LOC~~ SOLN
3.0000 [IU] | Freq: Three times a day (TID) | SUBCUTANEOUS | Status: DC
  Administered 2021-01-20 – 2021-01-22 (×6): 3 [IU] via SUBCUTANEOUS

## 2021-01-20 MED ORDER — HYDROMORPHONE HCL 1 MG/ML IJ SOLN
1.0000 mg | Freq: Once | INTRAMUSCULAR | Status: AC
Start: 2021-01-20 — End: 2021-01-20
  Administered 2021-01-20: 1 mg via INTRAVENOUS
  Filled 2021-01-20: qty 1

## 2021-01-20 MED ORDER — DOXYCYCLINE HYCLATE 100 MG PO TABS
100.0000 mg | ORAL_TABLET | Freq: Two times a day (BID) | ORAL | Status: DC
  Administered 2021-01-20 – 2021-01-24 (×8): 100 mg via ORAL
  Filled 2021-01-20 (×8): qty 1

## 2021-01-20 MED ORDER — CHLORHEXIDINE GLUCONATE CLOTH 2 % EX PADS
6.0000 | MEDICATED_PAD | Freq: Every day | CUTANEOUS | Status: DC
  Administered 2021-01-20 – 2021-01-24 (×5): 6 via TOPICAL

## 2021-01-20 MED ORDER — SODIUM CHLORIDE 0.9 % IV SOLN
2.0000 g | INTRAVENOUS | Status: DC
  Administered 2021-01-20 – 2021-01-23 (×4): 2 g via INTRAVENOUS
  Filled 2021-01-20 (×4): qty 20

## 2021-01-20 NOTE — Progress Notes (Signed)
Inpatient Diabetes Program Recommendations  AACE/ADA: New Consensus Statement on Inpatient Glycemic Control (2015)  Target Ranges:  Prepandial:   less than 140 mg/dL      Peak postprandial:   less than 180 mg/dL (1-2 hours)      Critically ill patients:  140 - 180 mg/dL   Lab Results  Component Value Date   GLUCAP 354 (H) 01/20/2021   HGBA1C 8.6 (H) 12/08/2020    Review of Glycemic Control Results for [name], [name] (MRN [id number]) as of 01/20/2021 09:47  Ref. Range 01/19/2021 23:39 01/20/2021 08:15  Glucose-Capillary Latest Ref Range: 70 - 99 mg/dL 321 (H) 354 (H)   Diabetes history: Type 1 DM Outpatient Diabetes medications: Lantus 4 units QD, Novolog 2 units TID Current orders for Inpatient glycemic control: Lantus 4 units QD, Novolog 0-9 units TID Prednisone 5 mg QD  Inpatient Diabetes Program Recommendations:    Glucose increased, assuming related to infection based on previous glucose trends and insulin needs from hospitalization on 01/08/2021.  In the setting of infection and steroids, consider adding Novolog 2 units TID (assuming patient is consuming >50% of meals).   Thanks, Bronson Curb, MSN, RNC-OB Diabetes Coordinator [phone] (8a-5p)

## 2021-01-20 NOTE — TOC Progression Note (Signed)
Transition of Care [facility]) - Progression Note    Patient Details  Name: [name] MRN: [id number] Date of Birth: Feb 07, 1961  [facility] CM/SW Contact  Shade Flood, LCSW Phone Number: 01/20/2021, 4:13 PM  Clinical Narrative:     TOC following. MD anticipating dc in the next day or two. Pt unsure if she wants to return to SNF. Discussed possible referral to LTAC for wound care and pt and her husband are interested in this option. Spoke with Raquel Sarna at [facility] who states that pt's insurance denied LTAC in mid December but they do think pt is likely appropriate for LTAC care and could try again to get auth.  Will follow up with new SNF referrals and pt's husband states that if nothing else, he will take her home and do the wound care himself.  TOC will follow up in AM.  Expected Discharge Plan: [place] Barriers to Discharge: Continued Medical Work up  Expected Discharge Plan and Services Expected Discharge Plan: [place] In-house Referral: NA Discharge Planning Services: NA   Living arrangements for the past 2 months: [place]                 DME Arranged: N/A DME Agency: NA       HH Arranged: IV Antibiotics,RN HH Agency: Other - See comment (Advanced Infusions and OP Wound Care with [facility])         Social Determinants of Health (SDOH) Interventions    Readmission Risk Interventions No flowsheet data found.

## 2021-01-20 NOTE — Progress Notes (Signed)
TR[facility] night shift.  The patient has told the staff that she is having 10/10 pain from her pressure ulcer. She is currently getting hydrocodone 5 mg with acetaminophen 325 mg every 4 hours, but is 2 hours away from receiving another dose.  Hydromorphone 1 mg IVP stat ordered.  Tennis Must, MD.

## 2021-01-20 NOTE — Progress Notes (Signed)
PROGRESS NOTE    [name]  [id number] DOB: Oct 15, 1961 DOA: 01/18/2021 PCP: [name], MD    Brief Narrative:  60 year old female with history of type 1 diabetes, status post kidney and pancreatic transplant, hypothyroidism, right below the knee potation in 09/2020, was recently admitted to the hospital for sacral osteomyelitis.  She was discharged to skilled nursing facility on intravenous antibiotics.  She returns to the hospital with confusion/lethargy.  She was noted to be mildly febrile and had low blood pressure.  Started on broad-spectrum antibiotics and IV fluids.   Assessment & Plan:   Principal Problem:   SIRS (systemic inflammatory response syndrome) (HCC) Active Problems:   Hypertension   Hypoalbuminemia   Sacral decubitus ulcer, stage IV (HCC)   Hypotension   Hypothyroid   Failure to thrive in adult   SIRS -It is unclear whether she has an active source of infection at this point -She does have osteomyelitis from sacral decubitus ulcer, although she has been on IV antibiotics since her last discharge -Patient had hypotension and mild fever on admission -Since she has a chronic PICC line, we would need to rule out bacteremia -Blood cultures have shown no growth at 48 hours -Overall hemodynamics have improved with IV fluids -She is placed on broad-spectrum antibiotics for now.  Stage IV sacral decubitus ulcer, present on admission, with underlying osteomyelitis -Appreciate wound care input -Continue on IV antibiotics -Since blood cultures are negative at 48 hours, will transition antibiotics back to Rocephin/doxycycline -With her hypoalbuminemia, it would be very challenging for her wound to heal  Anemia -Baseline hemoglobin ~8-9 -Patient did not have any evidence of bleeding  -Suspect this is related to chronic disease/nutritional deficiencies -Suspect decline in hemoglobin related to hemodilution -Check anemia panel -Repeat hemoglobin this  afternoon and if continues to decline, would need to consider PRBC transfusion  Possible UTI -Urine culture in process -Continue antibiotics  Hypotension -Improved with IV hydration  Hypertension -Holding metoprolol for now  Hypothyroidism -Continue on Synthroid  Acute kidney injury on chronic kidney disease stage III status post renal transplant -Patient did have an episode of hypotension when she was first brought to the emergency room -Suspect that she may have developed some degree of ATN -We will continue hydration and follow renal function -Check renal ultrasound -Continue immunosuppressants  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 -A1c of 7.2 -Continued on sliding scale insulin and Lantus -Blood sugars have been trending up -We will add meal coverage NovoLog to basal insulin  Goals of care -With poor functional nutritional status, long-term prognosis is poor -Palliative care consulted to assist with goals of care -Currently, she wishes to continue with all available treatments -She would benefit from outpatient palliative to follow for continued conversations   DVT prophylaxis: enoxaparin (LOVENOX) injection 40 mg Start: 01/18/21 2300  Code Status: Full code Family Communication: Discussed with husband over the phone 1/25 Disposition Plan: Status is: Inpatient.  During her last admission she was initially evaluated for LTAC, was not felt to be an appropriate candidate.  She discharged to skilled nursing facility and unfortunately had to be readmitted due to concerns of possible infection and dehydration.  I suspect that she would benefit from more intensive monitoring by MD at [facility] for regular wound care and monitoring of intravenous antibiotics  Remains inpatient appropriate because:IV treatments appropriate due to intensity of illness or inability to take PO   Dispo: The patient is from: SNF  Anticipated d/c is to: LTAC vs. SNF               Anticipated d/c date is: 2 days              Patient currently is not medically stable to d/c.   Difficult to place patient No   Consultants:   Palliative care  Procedures:     Antimicrobials:   Vancomycin 1/23 >1/25  Zosyn 1/23 >1/25  Doxycycline 1/25>2/17  Ceftriaxone 1/25> 2/17   Subjective: Feels overall pain in sacral area is better today.  Denies any shortness of breath or cough.  Objective: Vitals:   01/20/21 0715 01/20/21 0730 01/20/21 0900 01/20/21 1337  BP:  119/64 116/62 (!) 141/63  Pulse: 87  85 85  Resp: '10 12 16 18  '$ Temp:   (!) 97.5 F (36.4 C) 98.2 F (36.8 C)  TempSrc:   Oral Oral  SpO2: 99%  98% 100%  Weight:      Height:        Intake/Output Summary (Last 24 hours) at 01/20/2021 1754 Last data filed at 01/20/2021 1300 Gross per 24 hour  Intake 1094.1 ml  Output -  Net 1094.1 ml   Filed Weights   01/18/21 1849  Weight: 49 kg    Examination:  General exam: Appears calm and comfortable  Respiratory system: Clear to auscultation. Respiratory effort normal. Cardiovascular system: S1 & S2 heard, RRR. No JVD, murmurs, rubs, gallops or clicks. No pedal edema. Gastrointestinal system: Abdomen is nondistended, soft and nontender. No organomegaly or masses felt. Normal bowel sounds heard. Central nervous system: Alert and oriented. No focal neurological deficits. 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 Mood & affect appropriate.     Data Reviewed: I have personally reviewed following labs and imaging studies  CBC: Recent Labs  Lab 01/17/21 1857 01/18/21 1921 01/19/21 0651 01/20/21 0619  WBC 6.9 8.9 8.4 8.4  NEUTROABS 2.9 5.5 5.4 5.7  HGB 8.9* 9.6* 8.8* 7.4*  HCT 28.2* 29.6* 28.5* 23.7*  MCV 112.8* 110.4* 114.0* 115.0*  PLT 283 299 266 [id number]   Basic Metabolic Panel: Recent Labs  Lab 01/17/21 1857  01/18/21 1921 01/19/21 0651 01/20/21 0619  NA 133* 135 133* 134*  K 4.1 4.5 4.9 4.7  CL 100 102 101 101  CO2 24 23 18* 21*  GLUCOSE 161* 87 275* 355*  BUN 53* 49* 50* 52*  CREATININE 1.26* 1.10* 1.22* 1.45*  CALCIUM 9.8 10.3 10.0 9.6   GFR: Estimated Creatinine Clearance: 31.5 mL/min (A) (by C-G formula based on SCr of 1.45 mg/dL (H)). Liver Function Tests: Recent Labs  Lab 01/18/21 1921 01/19/21 0651 01/20/21 0619  AST 18 16 13*  ALT '11 10 8  '$ ALKPHOS 120 114 86  BILITOT 0.7 1.5* 1.0  PROT 5.7* 5.2* 5.1*  ALBUMIN 2.3* 2.0* 2.3*   No results for input(s): LIPASE, AMYLASE in the last 168 hours. No results for input(s): AMMONIA in the last 168 hours. Coagulation Profile: Recent Labs  Lab 01/18/21 1921  INR 1.3*   Cardiac Enzymes: No results for input(s): CKTOTAL, CKMB, CKMBINDEX, TROPONINI in the last 168 hours. BNP (last 3 results) No results for input(s): PROBNP in the last 8760 hours. HbA1C: No results for input(s): HGBA1C in the last 72 hours. CBG: Recent Labs  Lab 01/19/21 1839 01/19/21 2339 01/20/21 0815 01/20/21 1134 01/20/21 1647  GLUCAP 143* 321* 354*  286* 291*   Lipid Profile: No results for input(s): CHOL, HDL, LDLCALC, TRIG, CHOLHDL, LDLDIRECT in the last 72 hours. Thyroid Function Tests: No results for input(s): TSH, T4TOTAL, FREET4, T3FREE, THYROIDAB in the last 72 hours. Anemia Panel: No results for input(s): VITAMINB12, FOLATE, FERRITIN, TIBC, IRON, RETICCTPCT in the last 72 hours. Sepsis Labs: Recent Labs  Lab 01/18/21 1921  LATICACIDVEN 1.2    Recent Results (from the past 240 hour(s))  Urine culture     Status: Abnormal   Collection Time: 01/12/21 10:55 AM   Specimen: Urine, Catheterized  Result Value Ref Range Status   Specimen Description   Final    URINE, CATHETERIZED Performed at [facility], [address] [place], [place] [address]    Special Requests   Final    NONE Performed at [facility], [address]  [place], [place] [address]    Culture 70,000 COLONIES/mL YEAST (A)  Final   Report Status 01/13/2021 FINAL  Final  Blood culture (routine single)     Status: None (Preliminary result)   Collection Time: 01/18/21  7:58 PM   Specimen: Left Antecubital; Blood  Result Value Ref Range Status   Specimen Description LEFT ANTECUBITAL  Final   Special Requests   Final    BOTTLES DRAWN AEROBIC AND ANAEROBIC Blood Culture adequate volume   Culture   Final    NO GROWTH 2 DAYS Performed at [facility], [address] [place], [place] [address]    Report Status PENDING  Incomplete  Urine culture     Status: Abnormal   Collection Time: 01/18/21  8:45 PM   Specimen: In/Out Cath Urine  Result Value Ref Range Status   Specimen Description   Final    IN/OUT CATH URINE Performed at [facility], [address], [place], [place] [address]    Special Requests   Final    NONE Performed at [facility], [address] [place], [place] [address]    Culture >=100,000 COLONIES/mL YEAST (A)  Final   Report Status 01/20/2021 FINAL  Final  SARS Coronavirus 2 by RT PCR (hospital order, performed in [place] hospital lab) Nasopharyngeal Nasopharyngeal Swab     Status: None   Collection Time: 01/18/21  9:26 PM   Specimen: Nasopharyngeal Swab  Result Value Ref Range Status   SARS Coronavirus 2 NEGATIVE NEGATIVE Final    Comment: (NOTE) SARS-CoV-2 target nucleic acids are NOT DETECTED.  The SARS-CoV-2 RNA is generally detectable in upper and lower respiratory specimens during the acute phase of infection. The lowest concentration of SARS-CoV-2 viral copies this assay can detect is 250 copies / mL. A negative result does not preclude SARS-CoV-2 infection and should not be used as the sole basis for treatment or other patient management decisions.  A negative result may occur with improper specimen collection / handling, submission of specimen other than nasopharyngeal swab, presence of viral mutation(s) within  the areas targeted by this assay, and inadequate number of viral copies (<250 copies / mL). A negative result must be combined with clinical observations, patient history, and epidemiological information.  Fact Sheet for Patients:   StrictlyIdeas.no  Fact Sheet for Healthcare Providers: BankingDealers.co.za  This test is not yet approved or  cleared by the [place] FDA and has been authorized for detection and/or diagnosis of SARS-CoV-2 by FDA under an Emergency Use Authorization (EUA).  This EUA will remain in effect (meaning this test can be used) for the duration of the COVID-19 declaration under  Section 564(b)(1) of the Act, 21 U.S.C. section 360bbb-3(b)(1), unless the authorization is terminated or revoked sooner.  Performed at [facility], [address], [place], [place] [address]   Culture, blood (single)     Status: None (Preliminary result)   Collection Time: 01/18/21 11:18 PM   Specimen: Left Antecubital; Blood  Result Value Ref Range Status   Specimen Description LEFT ANTECUBITAL  Final   Special Requests   Final    BOTTLES DRAWN AEROBIC AND ANAEROBIC Blood Culture adequate volume   Culture   Final    NO GROWTH 2 DAYS Performed at [facility], [address], [place], [place] [address]    Report Status PENDING  Incomplete         Radiology Studies: CT Head Wo Contrast  Result Date: 01/18/2021 CLINICAL DATA:  60 year old female with altered mental status. EXAM: CT HEAD WITHOUT CONTRAST TECHNIQUE: Contiguous axial images were obtained from the base of the skull through the vertex without intravenous contrast. COMPARISON:  Head CT dated 09/26/2020. FINDINGS: Brain: Mild age-related atrophy and moderate chronic microvascular ischemic changes. There is no acute intracranial hemorrhage. No mass effect midline shift. No extra-axial fluid collection. Vascular: No hyperdense vessel or unexpected calcification. Skull:  Normal. Negative for fracture or focal lesion. Sinuses/Orbits: The visualized paranasal sinuses are clear. There is complete opacification of the mastoid air cells. Other: None IMPRESSION: 1. No acute intracranial pathology. 2. Age-related atrophy and chronic microvascular ischemic changes. 3. Bilateral mastoid effusions. Electronically Signed   By: Anner Crete M.D.   On: 01/18/2021 22:01   DG Chest Port 1 View  Result Date: 01/18/2021 CLINICAL DATA:  60 year old female with sepsis. EXAM: PORTABLE CHEST 1 VIEW COMPARISON:  Chest radiograph dated 12/25/2020. FINDINGS: Right-sided PICC with tip at the cavoatrial junction. No focal consolidation, pleural effusion or pneumothorax. Borderline cardiomegaly. No acute osseous pathology. Osteopenia. IMPRESSION: No acute cardiopulmonary process. Electronically Signed   By: Anner Crete M.D.   On: 01/18/2021 20:57        Scheduled Meds: . azaTHIOprine  50 mg Oral Daily  . Chlorhexidine Gluconate Cloth  6 each Topical Daily  . collagenase   Topical Daily  . cycloSPORINE  75 mg Oral BID  . doxycycline  100 mg Oral Q12H  . enoxaparin (LOVENOX) injection  40 mg Subcutaneous Q24H  . folic acid  1 mg Oral Daily  . insulin aspart  0-9 Units Subcutaneous TID WC  . insulin glargine  4 Units Subcutaneous Daily  . levothyroxine  50 mcg Oral Daily  . liver oil-zinc oxide   Topical See admin instructions  . pantoprazole  40 mg Oral Daily  . polyethylene glycol  17 g Oral Daily  . predniSONE  5 mg Oral Daily  . rosuvastatin  5 mg Oral QHS  . sertraline  25 mg Oral Daily  . sodium bicarbonate  650 mg Oral BID  . cyanocobalamin  100 mcg Oral Daily   Continuous Infusions: . albumin human 25 g (01/20/21 0939)  . lactated ringers 75 mL/hr at 01/20/21 0412  . piperacillin-tazobactam (ZOSYN)  IV 3.375 g (01/20/21 1347)     LOS: 2 days    Time spent: 36mns    JKathie Dike MD [facility]   If 7PM-7AM, please contact  night-coverage [email or web address]  01/20/2021, 5:54 PM

## 2021-01-20 NOTE — NC FL2 (Signed)
[place] LEVEL OF CARE SCREENING TOOL     IDENTIFICATION  Patient Name: [name] Birthdate: 02-10-1961 Sex: female Admission Date (Current Location): 01/18/2021  [facility] and [place] Number:  [employer] and Address:  [place] [address], [place]      Provider Number: [id number]  Attending Physician Name and Address:  Kathie Dike, MD  Relative Name and Phone Number:       Current Level of Care: Hospital Recommended Level of Care: [place] Prior Approval Number:    Date Approved/Denied:   PASRR Number: [id number]  Discharge Plan: SNF    Current Diagnoses: Patient Active Problem List   Diagnosis Date Noted  . SIRS (systemic inflammatory response syndrome) ([place]) 01/18/2021  . Malnutrition of moderate degree 01/07/2021  . Acute osteomyelitis of sacrum ([place])   . Failure to thrive in adult   . Unspecified severe protein-calorie malnutrition ([place]) 12/10/2020  . Acute metabolic encephalopathy [id number]  . Sacral decubitus ulcer, stage IV ([place]) 12/08/2020  . Cellulitis 12/08/2020  . Hypotension 12/08/2020  . Malodorous urine 12/08/2020  . Erosive esophagitis 12/08/2020  . Hypothyroid 12/08/2020  . Depression 12/08/2020  . Type 1 diabetes mellitus with hyperlipidemia ([place]) 12/08/2020  . Sepsis ([place]) 12/07/2020  . Hypoalbuminemia 12/07/2020  . Hyponatremia 12/07/2020  . CAP (community acquired pneumonia) 12/07/2020  . Acute urinary retention 12/07/2020  . Coffee ground emesis   . Gastroesophageal reflux disease with esophagitis and hemorrhage   . Long-term use of immunosuppressant medication   . GI bleed 11/08/2020  . AKI (acute kidney injury) ([place]) 10/29/2020  . Gangrene of right foot ([place])   . Subacute osteomyelitis of right foot ([place])   . Type 1 diabetes mellitus with complication, with long term current use of insulin pump ([place]) 09/27/2020  . Pressure ulcer of BKA stump ([place])  09/27/2020  . Diabetic ketoacidosis ([place]) 09/26/2020  . Acute kidney injury superimposed on CKD ([place]) 09/26/2020  . Cellulitis and abscess of right leg 09/26/2020  . 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 S/p cadaver renal transplant 05/14/2020  . Chronic kidney disease (CKD) stage G3a/A1, moderately decreased glomerular filtration rate (GFR) between 45-59 mL/min/1.73 square meter and albuminuria creatinine ratio less than 30 mg/g (HCC) 05/14/2020  . Hypertension 05/14/2020    Orientation RESPIRATION BLADDER Height & Weight     Self,Place,Situation  Normal Incontinent Weight: 108 lb 0.4 oz (49 kg) Height:  '5\' 3"'$  (160 cm)  BEHAVIORAL SYMPTOMS/MOOD NEUROLOGICAL BOWEL NUTRITION STATUS      Incontinent Diet (see dc summary)  AMBULATORY STATUS COMMUNICATION OF NEEDS Skin   Extensive Assist Verbally PU Stage and Appropriate Care (See dc summary)                       Personal Care Assistance Level of Assistance      Feeding assistance: Limited assistance       Functional Limitations Info      Hearing Info: Adequate Speech Info: Adequate    SPECIAL CARE FACTORS FREQUENCY  PT (By licensed PT),OT (By licensed OT)     PT Frequency: 5x week OT Frequency: 3x week            Contractures Contractures Info: Not present    Additional Factors Info    Code Status Info: Full Allergies Info: NKA           Current Medications (01/20/2021):  This is the current hospital active medication  list Current Facility-Administered Medications  Medication Dose Route Frequency Provider Last Rate Last Admin  . albumin human 25 % solution 25 g  25 g Intravenous BID Onnie Boer Q, RPH-CPP 60 mL/hr at 01/20/21 0939 25 g at 01/20/21 0939  . azaTHIOprine (IMURAN) tablet 50 mg  50 mg Oral Daily Reubin Milan, MD   50 mg at 01/20/21 0836  . Chlorhexidine Gluconate Cloth 2 % PADS 6 each  6 each Topical Daily Kathie Dike, MD   6 each at 01/20/21 [phone]  . collagenase (SANTYL)  ointment   Topical Daily Reubin Milan, MD      . cycloSPORINE (SANDIMMUNE) capsule 75 mg  75 mg Oral BID Reubin Milan, MD   75 mg at 01/20/21 [id number]  . doxycycline (VIBRA-TABS) tablet 100 mg  100 mg Oral Q12H Memon, Jolaine Artist, MD      . enoxaparin (LOVENOX) injection 40 mg  40 mg Subcutaneous Q24H Reubin Milan, MD   40 mg at 01/19/21 2210  . folic acid (FOLVITE) tablet 1 mg  1 mg Oral Daily Reubin Milan, MD   1 mg at 01/20/21 [id number]  . HYDROcodone-acetaminophen (NORCO/VICODIN) 5-325 MG per tablet 1.5 tablet  1.5 tablet Oral [id number] PRN Pershing Proud, NP   1.5 tablet at 01/20/21 1114  . insulin aspart (novoLOG) injection 0-9 Units  0-9 Units Subcutaneous TID WC Reubin Milan, MD   5 Units at 01/20/21 1148  . insulin glargine (LANTUS) injection 4 Units  4 Units Subcutaneous Daily Reubin Milan, MD   4 Units at 01/20/21 1050  . lactated ringers infusion   Intravenous Continuous Reubin Milan, MD 75 mL/hr at 01/20/21 [id number] Restarted at 01/20/21 0412  . levothyroxine (SYNTHROID) tablet 50 mcg  50 mcg Oral Daily Reubin Milan, MD   50 mcg at 01/20/21 0543  . liver oil-zinc oxide (DESITIN) 40 % ointment   Topical See admin instructions Kathie Dike, MD      . pantoprazole (PROTONIX) EC tablet 40 mg  40 mg Oral Daily Reubin Milan, MD   40 mg at 01/20/21 0834  . piperacillin-tazobactam (ZOSYN) IVPB 3.375 g  3.375 g Intravenous Q8H Reubin Milan, MD 12.5 mL/hr at 01/20/21 1347 3.375 g at 01/20/21 1347  . polyethylene glycol (MIRALAX / GLYCOLAX) packet 17 g  17 g Oral Daily Reubin Milan, MD   17 g at 01/20/21 [id number]  . predniSONE (DELTASONE) tablet 5 mg  5 mg Oral Daily Reubin Milan, MD   5 mg at 01/20/21 [id number]  . prochlorperazine (COMPAZINE) injection 5 mg  5 mg Intravenous Q4H PRN Reubin Milan, MD   5 mg at 01/20/21 1008  . rosuvastatin (CRESTOR) tablet 5 mg  5 mg Oral QHS Reubin Milan, MD   5 mg at 01/19/21 2209  .  senna-docusate (Senokot-S) tablet 2 tablet  2 tablet Oral BID PRN Reubin Milan, MD      . sertraline (ZOLOFT) tablet 25 mg  25 mg Oral Daily Reubin Milan, MD   25 mg at 01/20/21 [id number]  . sodium bicarbonate tablet 650 mg  650 mg Oral BID Reubin Milan, MD   650 mg at 01/20/21 0947  . vitamin B-12 (CYANOCOBALAMIN) tablet 100 mcg  100 mcg Oral Daily Reubin Milan, MD   100 mcg at 01/20/21 [id number]     Discharge Medications: Please see discharge summary for a list of discharge medications.  Relevant Imaging Results:  Relevant  Lab Results:   Additional Information SSN: [id number],

## 2021-01-20 NOTE — Consult Note (Signed)
Consultation Note Date: 01/20/2021   Patient Name: [name]  DOB: May 08, 1961  MRN: [id number]  Age / Sex: 60 y.o., female  PCP: [name], MD Referring Physician: Kathie Dike, MD  Reason for Consultation: Establishing goals of care  HPI/Patient Profile: 60 y.o. female  with past medical history of diabetes type 1, kidney transplant, R BKA, sacral wound with osteomyelitis admitted on 01/18/2021 with altered mental status and confusion with concern for infection but sacral ulcer does not appear to be infected currently and responded well to IV fluids.   Clinical Assessment and Goals of Care: I met today with [name]. No family at bedside. She is an extremely pleasant lady in unfortunate health circumstances. She was seen by my colleague for inpatient palliative consultation in December 2021 in which she requested ongoing aggressive care with hopes of improvement.   I met with [name] and we discussed her current situation with severe sacral wound and concern for adequate healing. She shares that her health made a drastic turn when she had her amputation in October 2021 and has never been the same since. She is very sad and frustrated by her situation. She is hopeful to return home as she misses her husband and her shitzu, Royann Shivers. She expresses to me that she has faith that God will ultimately heal her and she continues to pray but does admit being discouraged that she continues to be so sick. I encouraged her to continue to lean on her faith and trust God that he has a plan for her but we unfortunately do not always know what that plan is for [place].   We further discussed advance directives and I inquired if she and her husband have ever had any conversations regarding if her health were to get worse instead of better. She shares that they have not really had these conversations but she does see the  value in this. We acknowledge the difficulty of having these conversations but that if either one of them were in a position of having to make difficult decisions for the other they would be glad that they did have those conversations as we never know what the future holds. We discussed Living Will and she would be interested in completing with her husband. I will consult spiritual care for assistance and further spiritual support. We also discussed code status in which she admits previous desire for full code but after all she has been through she is now unsure. I encouraged her to continue to think and pray about what is right for her. She would like some more time to consider her wishes.   Of note, [name] shares that she and her husband are from up [place] and she is originally from [place]. They have moved ~5 times mainly for her husband's job. She likes this area and is happy with the weather. She has a devoted husband, [name]. He works but his job is just across the street from their home. We discussed benefit of medical alert button  is she is to be at home alone at any times. They have no children but have a beloved shitzu named Royann Shivers that she misses dearly.   All questions/concerns addressed. Emotional support provided.   Primary Decision Maker PATIENT    SUMMARY OF RECOMMENDATIONS   - Patient expresses interest in completing Advance Directive and spiritual care consulted to assist (as well as spiritual support) - Would benefit from outpatient palliative to follow at SNF/home to continue goals of care conversations  Code Status/Advance Care Planning:  Full code   Symptom Management:   Pain: Increase hydrocodone-acetaminophen 5/325 to 1.5 tablets. 1 tablet not providing relief and dilaudid too strong. No acetaminophen to be given in addition to pain medication if needing routinely. Limit 3000 mg/day recommended.   Palliative Prophylaxis:   Aspiration, Bowel Regimen, Delirium  Protocol, Frequent Pain Assessment and Turn Reposition  Additional Recommendations (Limitations, Scope, Preferences):  Full Scope Treatment  Psycho-social/Spiritual:   Desire for further Chaplaincy support:yes  Additional Recommendations: Caregiving  Support/Resources  Prognosis:   Overall prognosis guarded with poor functional status and severe wound.   Discharge Planning: [place] for rehab with Palliative care service follow-up      Primary Diagnoses: Present on Admission: . SIRS (systemic inflammatory response syndrome) (HCC) . Hypertension . Hypoalbuminemia . Hypotension . Hypothyroid . Failure to thrive in adult . Sacral decubitus ulcer, stage IV ([place])   I have reviewed the medical record, interviewed the patient and family, and examined the patient. The following aspects are pertinent.  Past Medical History:  Diagnosis Date  . Anemia of chronic renal failure   . Diabetes mellitus without complication ([place])   . Hypothyroid 12/08/2020  . Sacral decubitus ulcer, stage IV ([place]) 12/08/2020   Social History   Socioeconomic History  . Marital status: Married    Spouse name: Not on file  . Number of children: Not on file  . Years of education: Not on file  . Highest education level: Not on file  Occupational History  . Not on file  Tobacco Use  . Smoking status: Never Smoker  . Smokeless tobacco: Never Used  Vaping Use  . Vaping Use: Never used  Substance and Sexual Activity  . Alcohol use: Never  . Drug use: Never  . Sexual activity: Not Currently    Birth control/protection: None  Other Topics Concern  . Not on file  Social History Narrative  . Not on file   Social Determinants of Health   Financial Resource Strain: Not on file  Food Insecurity: Not on file  Transportation Needs: Not on file  Physical Activity: Not on file  Stress: Not on file  Social Connections: Not on file   Family History  Problem Relation Age of Onset   . Hypertension Mother   . Hypertension Father    Scheduled Meds: . azaTHIOprine  50 mg Oral Daily  . Chlorhexidine Gluconate Cloth  6 each Topical Daily  . collagenase   Topical Daily  . cycloSPORINE  75 mg Oral BID  . enoxaparin (LOVENOX) injection  40 mg Subcutaneous Q24H  . folic acid  1 mg Oral Daily  . insulin aspart  0-9 Units Subcutaneous TID WC  . insulin glargine  4 Units Subcutaneous Daily  . levothyroxine  50 mcg Oral Daily  . liver oil-zinc oxide   Topical See admin instructions  . pantoprazole  40 mg Oral Daily  . polyethylene glycol  17 g Oral Daily  . predniSONE  5 mg Oral  Daily  . rosuvastatin  5 mg Oral QHS  . sertraline  25 mg Oral Daily  . sodium bicarbonate  650 mg Oral BID  . cyanocobalamin  100 mcg Oral Daily   Continuous Infusions: . albumin human 25 g (01/20/21 0939)  . lactated ringers 75 mL/hr at 01/20/21 0412  . piperacillin-tazobactam (ZOSYN)  IV 3.375 g (01/20/21 0542)   PRN Meds:.acetaminophen **OR** acetaminophen, HYDROcodone-acetaminophen, prochlorperazine, senna-docusate No Known Allergies Review of Systems  Constitutional: Positive for activity change and appetite change.  Skin: Positive for wound.  Neurological: Positive for weakness.    Physical Exam Vitals and nursing note reviewed.  Constitutional:      General: She is not in acute distress.    Appearance: She is ill-appearing.  Cardiovascular:     Rate and Rhythm: Normal rate.  Pulmonary:     Effort: Pulmonary effort is normal. No tachypnea, accessory muscle usage or respiratory distress.  Abdominal:     General: Abdomen is flat.  Skin:    Comments: Wounds not assessed  Neurological:     Mental Status: She is alert and oriented to person, place, and time.     Vital Signs: BP 116/62 (BP Location: Left Arm)   Pulse 85   Temp (!) 97.5 F (36.4 C) (Oral)   Resp 16   Ht _0  (1.6 m)   Wt 49 kg   SpO2 98%   BMI 19.14 kg/m  Pain Scale: 0-10   Pain Score: 0-No  pain   SpO2: SpO2: 98 % O2 Device:SpO2: 98 % O2 Flow Rate: .   IO: Intake/output summary:   Intake/Output Summary (Last 24 hours) at 01/20/2021 1003 Last data filed at 01/20/2021 0379 Gross per 24 hour  Intake 331.4 ml  Output -  Net 331.4 ml    LBM: Last BM Date: 01/19/21 Baseline Weight: Weight: 49 kg Most recent weight: Weight: 49 kg     Palliative Assessment/Data:     Time In: 0920 Time Out: 1010 Time Total: 50 min Greater than 50%  of this time was spent counseling and coordinating care related to the above assessment and plan.  Signed by: Vinie Sill, NP Palliative Medicine Team Pager # [phone] (M-F 8a-5p) Team Phone # [phone] (Nights/Weekends)

## 2021-01-21 ENCOUNTER — Encounter: Payer: Self-pay | Admitting: *Deleted

## 2021-01-21 ENCOUNTER — Inpatient Hospital Stay (HOSPITAL_COMMUNITY): Payer: BC Managed Care – PPO

## 2021-01-21 DIAGNOSIS — R6510 Systemic inflammatory response syndrome (SIRS) of non-infectious origin without acute organ dysfunction: Secondary | ICD-10-CM

## 2021-01-21 LAB — CBC WITH DIFFERENTIAL/PLATELET
Abs Immature Granulocytes: 0.03 10*3/uL (ref 0.00–0.07)
Basophils Absolute: 0.1 10*3/uL (ref 0.0–0.1)
Basophils Relative: 1 %
Eosinophils Absolute: 0.1 10*3/uL (ref 0.0–0.5)
Eosinophils Relative: 2 %
HCT: 28.6 % — ABNORMAL LOW (ref 36.0–46.0)
Hemoglobin: 9.1 g/dL — ABNORMAL LOW (ref 12.0–15.0)
Immature Granulocytes: 0 %
Lymphocytes Relative: 28 %
Lymphs Abs: 2 10*3/uL (ref 0.7–4.0)
MCH: 34.1 pg — ABNORMAL HIGH (ref 26.0–34.0)
MCHC: 31.8 g/dL (ref 30.0–36.0)
MCV: 107.1 fL — ABNORMAL HIGH (ref 80.0–100.0)
Monocytes Absolute: 0.4 10*3/uL (ref 0.1–1.0)
Monocytes Relative: 5 %
Neutro Abs: 4.5 10*3/uL (ref 1.7–7.7)
Neutrophils Relative %: 64 %
Platelets: 212 10*3/uL (ref 150–400)
RBC: 2.67 MIL/uL — ABNORMAL LOW (ref 3.87–5.11)
RDW: 22.9 % — ABNORMAL HIGH (ref 11.5–15.5)
WBC: 7.1 10*3/uL (ref 4.0–10.5)
nRBC: 0 % (ref 0.0–0.2)

## 2021-01-21 LAB — CBC
HCT: 21.5 % — ABNORMAL LOW (ref 36.0–46.0)
Hemoglobin: 6.9 g/dL — CL (ref 12.0–15.0)
MCH: 36.1 pg — ABNORMAL HIGH (ref 26.0–34.0)
MCHC: 32.1 g/dL (ref 30.0–36.0)
MCV: 112.6 fL — ABNORMAL HIGH (ref 80.0–100.0)
Platelets: 224 10*3/uL (ref 150–400)
RBC: 1.91 MIL/uL — ABNORMAL LOW (ref 3.87–5.11)
RDW: 21.2 % — ABNORMAL HIGH (ref 11.5–15.5)
WBC: 7.8 10*3/uL (ref 4.0–10.5)
nRBC: 0 % (ref 0.0–0.2)

## 2021-01-21 LAB — PREPARE RBC (CROSSMATCH)

## 2021-01-21 LAB — IRON AND TIBC: Iron: 63 ug/dL (ref 28–170)

## 2021-01-21 LAB — RETICULOCYTES
Immature Retic Fract: 11.7 % (ref 2.3–15.9)
RBC.: 1.9 MIL/uL — ABNORMAL LOW (ref 3.87–5.11)
Retic Count, Absolute: 38.2 10*3/uL (ref 19.0–186.0)
Retic Ct Pct: 2 % (ref 0.4–3.1)

## 2021-01-21 LAB — RENAL FUNCTION PANEL
Albumin: 3 g/dL — ABNORMAL LOW (ref 3.5–5.0)
Anion gap: 8 (ref 5–15)
BUN: 49 mg/dL — ABNORMAL HIGH (ref 8–23)
CO2: 24 mmol/L (ref 22–32)
Calcium: 9.4 mg/dL (ref 8.9–10.3)
Chloride: 102 mmol/L (ref 98–111)
Creatinine, Ser: 1.32 mg/dL — ABNORMAL HIGH (ref 0.44–1.00)
GFR, Estimated: 46 mL/min — ABNORMAL LOW (ref >60)
Glucose, Bld: 268 mg/dL — ABNORMAL HIGH (ref 70–99)
Phosphorus: 3.4 mg/dL (ref 2.5–4.6)
Potassium: 4.4 mmol/L (ref 3.5–5.1)
Sodium: 134 mmol/L — ABNORMAL LOW (ref 135–145)

## 2021-01-21 LAB — GLUCOSE, CAPILLARY
Glucose-Capillary: 356 mg/dL — ABNORMAL HIGH (ref 70–99)
Glucose-Capillary: 301 mg/dL — ABNORMAL HIGH (ref 70–99)
Glucose-Capillary: 204 mg/dL — ABNORMAL HIGH (ref 70–99)
Glucose-Capillary: 169 mg/dL — ABNORMAL HIGH (ref 70–99)

## 2021-01-21 LAB — FOLATE: Folate: 22.9 ng/mL (ref >5.9)

## 2021-01-21 LAB — CREATININE, SERUM
Creatinine, Ser: 1.25 mg/dL — ABNORMAL HIGH (ref 0.44–1.00)
GFR, Estimated: 49 mL/min — ABNORMAL LOW (ref >60)

## 2021-01-21 LAB — OCCULT BLOOD X 1 CARD TO LAB, STOOL: Fecal Occult Bld: NEGATIVE

## 2021-01-21 LAB — VITAMIN B12: Vitamin B-12: 2679 pg/mL — ABNORMAL HIGH (ref 180–914)

## 2021-01-21 LAB — FERRITIN: Ferritin: 878 ng/mL — ABNORMAL HIGH (ref 11–307)

## 2021-01-21 MED ORDER — PANTOPRAZOLE SODIUM 40 MG PO TBEC
40.0000 mg | DELAYED_RELEASE_TABLET | Freq: Two times a day (BID) | ORAL | Status: DC
  Administered 2021-01-21 – 2021-01-24 (×6): 40 mg via ORAL
  Filled 2021-01-21 (×6): qty 1

## 2021-01-21 MED ORDER — SODIUM CHLORIDE 0.9% IV SOLUTION: Freq: Once | INTRAVENOUS | Status: DC

## 2021-01-21 NOTE — Progress Notes (Addendum)
CRITICAL VALUE ALERT  Critical Value: HGB 6.9  Date & Time Notied:   01/21/21 0105  Provider Notified: DR Olevia Bowens  Orders Received/Actions taken:  AWAITING

## 2021-01-21 NOTE — TOC Progression Note (Signed)
Transition of Care [facility]) - Progression Note    Patient Details  Name: Nilaja Hoxsie MRN: [id number] Date of Birth: [birth year]  [facility]) CM/SW Contact  Shade Flood, LCSW Phone Number: 01/21/2021, 12:01 PM  Clinical Narrative:     TOC following. Pt status reviewed with MD in Progression today.   Raquel Sarna at [facility] starting insurance authorization request today.   TOC will follow.    Expected Discharge Plan: Long Term Acute Care (LTAC) Barriers to Discharge: Continued Medical Work up  Expected Discharge Plan and Services Expected Discharge Plan: [place] (LTAC) In-house Referral: NA Discharge Planning Services: NA   Living arrangements for the past 2 months: Skilled [place]                 DME Arranged: N/A DME Agency: NA       HH Arranged: IV Antibiotics,RN HH Agency: Other - See comment (Advanced Infusions and OP Wound Care with [place])         Social Determinants of Health (SDOH) Interventions    Readmission Risk Interventions No flowsheet data found.

## 2021-01-21 NOTE — Progress Notes (Signed)
PT Cancellation Note  Patient Details Name: [name] MRN: [id number] DOB: 1961/08/01   Cancelled Treatment:    Reason Eval/Treat Not Completed: PT screened, no needs identified, will sign off. Patient at baseline, non-ambulatory at baseline.   4:08 PM, 01/21/21 Jerene Pitch, DPT Physical Therapy with [facility]  [phone] office

## 2021-01-21 NOTE — Progress Notes (Signed)
PROGRESS NOTE  [name] [id number] DOB: Apr 24, 1961 DOA: 01/18/2021 PCP: [name], MD  Brief History:   60 y.o. 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  She was confused and unable to provide further information.  The patient was started on IV zosyn initially which has since been de-escalated back to CTX and doxy. Notably, the patient was hospitalized most recently from 12/07/2020 to 01/08/2021.  During that hospitalization, the patient was treated for acute osteomyelitis of the sacrum. MRI (01/02/20) confirms acute osteomyelitis and bone is exposed on exam. The patient was discharged to a skilled nursing facility with ceftriaxone 2 g daily and doxycycline 100 mg twice daily with a tentative end date of 02/12/2021.  The PICC line was placed on 01/02/2021 at that time.  That hospitalization was prolonged and complicated by aspiration pneumonia for which the patient finished a 5-day course of IV Zosyn.  In addition, the patient developed acute on chronic renal failure which ultimately improved.  ED Course: Initial vital signs were temperature 100.4 F, pulse 100, respirations 16, BP 80/45 mmHg and O2 sat 99% on room air.  The patient received 2000 mL of LR bolus, vancomycin and Zosyn per pharmacy.  Her blood pressure normalized after the fluid bolus.  Labwork: Urinalysis was turbid, with small hemoglobinuria, ketonuria 5 and proteinuria more than 300 mg/dL.  Nitrite was negative, leukocyte Estrace was large, microscopic examination showed more than 50 RBC  and more than 50 WBC with rare bacteria.  There was positive WBC clumps, mucus, budding yeast and crystals present.  CBC showed a white count of 8.9, hemoglobin 9.6 g/dL with an MCV of 110.4 fL and platelets 299.  PT 15.3, INR 1.3, PTT 41.  Lactic acid was normal.  CMP shows a BUN of 49 and creatinine of 1.10 mg/dL with a GFR of 57 mL/min.  Total protein is 5.7 and albumin 2.3 g/dL.  The rest of the CMP values are unremarkable.  Coronavirus PCR was negative.  Assessment/Plan: SIRS -sepsis ruled out -She does have osteomyelitis from sacral decubitus ulcer, although she has been on IV antibiotics since her last discharge -Patient had hypotension and mild fever (100.4) on admission -Since she has a chronic PICC line- placed 01/02/21 -Blood cultures have shown no growth  -Overall hemodynamics have improved with IV fluids -She is placed on broad-spectrum antibiotics for now.  Stage IV sacral decubitus ulcer, present on admission, with underlying osteomyelitis -Appreciate wound care input -Continue on IV antibiotics -Since blood cultures are negative  will transition antibiotics back to Rocephin/doxycycline -With her hypoalbuminemia, it would be very challenging for her wound to heal  Debility/Failure to Thrive -this represents the sixth hospitalization since 09/26/20 -review of record shows patient has had progressive weakness with increasing need for assistance with ADLs -She has been to SNF after each of her prior hospitalizations and readmitted to hospital for a variety of medical issues including DKA, acute on CKD3, UGIB, infected sacral decubitus, infected right leg s/p R-BKA -each hospitalization has resulted in increasing debility to the point of development of stage 4 sacral decubitus -she  would benefit for an LTAC stay for complex wound care and IV abx where she can be monitored closer medically   Anemia -Baseline hemoglobin ~8-9 -Patient did not have any evidence of bleeding  -Suspect  this is related to chronic disease/nutritional deficiencies -Suspect decline in hemoglobin related to hemodilution -Check anemia panel -Repeat hemoglobin this afternoon and if continues to decline, would need to consider PRBC transfusion  funguria -unclear clinical significance as patient is improving without antifungal tx -Continue antibiotics  Hypotension -Improved with IV hydration  Hypertension -Holding metoprolol due to soft BPs initially  Hypothyroidism -Continue on Synthroid  Acute on chronic anemia of chronic disease -check FOBT -partly due to dilution -baseline Hgb 8-9 -anemia panel  chronic kidney disease stage IIIa status post renal transplant -baseline creatinine 0.9-1.2 -Patient did have an episode of hypotension when she was first brought to the emergency room -Suspect that she may have developed some degree of ATN -We will continue hydration and follow renal function -Check renal ultrasound--native kidneys are not well seen sonographically -Continue immunosuppressants  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 -A1c of 7.2 -Continued on sliding scale insulin and Lantus -Blood sugars have been trending up -We will add meal coverage NovoLog to basal insulin  Urine Retention -foley placed 01/18/20  Goals of care -With poor functional nutritional status, long-term prognosis is poor -Palliative care consulted to assist with goals of care -Currently, she wishes to continue with all available treatments -She would benefit from outpatient palliative to follow for continued conversations     Status is: Inpatient  Remains inpatient appropriate because:IV treatments appropriate due to intensity of illness or inability to take PO   Dispo: The patient is from: SNF              Anticipated d/c is to: LTAC              Anticipated d/c date is: 1 day              Patient currently is medically stable to d/c.   Difficult to place patient  No        Family Communication:   Family at bedside  Consultants:  none  Code Status:  FUL  DVT Prophylaxis:  [place] Lovenox   Procedures: As Listed in Progress Note Above  Antibiotics: None  RN Pressure Injury Documentation: Pressure Injury 12/10/20 Heel Left;Posterior Stage 2 -  Partial thickness loss of dermis presenting as a shallow open injury with a red, pink wound bed without slough. (Active)  12/10/20 1325  Location: Heel  Location Orientation: Left;Posterior  Staging: Stage 2 -  Partial thickness loss of dermis presenting as a shallow open injury with a red, pink wound bed without slough.  Wound Description (Comments):   Present on Admission: Yes     Pressure Injury 12/24/20 Sacrum Stage 4 - Full thickness tissue loss with exposed bone, tendon or muscle. (Active)  12/24/20   Location: Sacrum  Location Orientation:   Staging: Stage 4 - Full thickness tissue loss with exposed bone, tendon or muscle.  Wound Description (Comments):   Present on Admission: Yes        Subjective: Patient denies fevers, chills, headache, chest pain, dyspnea, nausea, vomiting, diarrhea, abdominal pain, dysuria, hematuria, hematochezia, and melena.   Objective: Vitals:   01/21/21 0330 01/21/21 0410 01/21/21 0529 01/21/21 0612  BP: (!) 144/69 (!) 141/69 (!) 158/70 (!) 152/77  Pulse: 77 72 73 72  Resp: '18 18 18 18  '$ Temp: 99  F (37.2 C) 98.4 F (36.9 C) 98.4 F (36.9 C) 98.1 F (36.7 C)  TempSrc: Oral Oral Oral Oral  SpO2: 98%  98%   Weight:      Height:        Intake/Output Summary (Last 24 hours) at 01/21/2021 1236 Last data filed at 01/21/2021 0900 Gross per 24 hour  Intake 2908.26 ml  Output 950 ml  Net 1958.26 ml   Weight change:  Exam:   General:  Pt is alert, follows commands appropriately, not in acute distress  HEENT: No icterus, No thrush, No neck mass, [place]/AT  Cardiovascular: RRR, S1/S2, no rubs, no gallops Respiratory: bibasilar crackles. No  wheeze  Abdomen: Soft/+BS, non tender, non distended, no guarding  Extremities: No edema, No lymphangitis, No petechiae, No rashes, no synovitis   Data Reviewed: I have personally reviewed following labs and imaging studies Basic Metabolic Panel: Recent Labs  Lab 01/17/21 1857 01/18/21 1921 01/19/21 0651 01/20/21 0619 01/21/21 0025 01/21/21 0842  NA 133* 135 133* 134* 134*  --   K 4.1 4.5 4.9 4.7 4.4  --   CL 100 102 101 101 102  --   CO2 24 23 18* 21* 24  --   GLUCOSE 161* 87 275* 355* 268*  --   BUN 53* 49* 50* 52* 49*  --   CREATININE 1.26* 1.10* 1.22* 1.45* 1.32* 1.25*  CALCIUM 9.8 10.3 10.0 9.6 9.4  --   PHOS  --   --   --   --  3.4  --    Liver Function Tests: Recent Labs  Lab 01/18/21 1921 01/19/21 0651 01/20/21 0619 01/21/21 0025  AST 18 16 13*  --   ALT '11 10 8  '$ --   ALKPHOS 120 114 86  --   BILITOT 0.7 1.5* 1.0  --   PROT 5.7* 5.2* 5.1*  --   ALBUMIN 2.3* 2.0* 2.3* 3.0*   No results for input(s): LIPASE, AMYLASE in the last 168 hours. No results for input(s): AMMONIA in the last 168 hours. Coagulation Profile: Recent Labs  Lab 01/18/21 1921  INR 1.3*   CBC: Recent Labs  Lab 01/17/21 1857 01/18/21 1921 01/19/21 0651 01/20/21 0619 01/21/21 0025 01/21/21 0842  WBC 6.9 8.9 8.4 8.4 7.8 7.1  NEUTROABS 2.9 5.5 5.4 5.7  --  4.5  HGB 8.9* 9.6* 8.8* 7.4* 6.9* 9.1*  HCT 28.2* 29.6* 28.5* 23.7* 21.5* 28.6*  MCV 112.8* 110.4* 114.0* 115.0* 112.6* 107.1*  PLT 283 299 266 253 224 212   Cardiac Enzymes: No results for input(s): CKTOTAL, CKMB, CKMBINDEX, TROPONINI in the last 168 hours. BNP: Invalid input(s): POCBNP CBG: Recent Labs  Lab 01/20/21 1134 01/20/21 1647 01/20/21 2114 01/21/21 0821 01/21/21 1201  GLUCAP 286* 291* 263* 356* 301*   HbA1C: No results for input(s): HGBA1C in the last 72 hours. Urine analysis:    Component Value Date/Time   COLORURINE YELLOW 01/18/2021 2045   APPEARANCEUR TURBID (A) 01/18/2021 2045   LABSPEC 1.014  01/18/2021 2045   PHURINE 6.0 01/18/2021 2045   GLUCOSEU NEGATIVE 01/18/2021 2045   HGBUR SMALL (A) 01/18/2021 2045   BILIRUBINUR NEGATIVE 01/18/2021 2045   KETONESUR 5 (A) 01/18/2021 2045   PROTEINUR >=300 (A) 01/18/2021 2045   NITRITE NEGATIVE 01/18/2021 2045   LEUKOCYTESUR LARGE (A) 01/18/2021 2045   Sepsis Labs: '@LABRCNTIP'$ (procalcitonin:4,lacticidven:4) ) Recent Results (from the past 240 hour(s))  Urine culture     Status: Abnormal   Collection Time: 01/12/21 10:55 AM   Specimen: Urine,  Catheterized  Result Value Ref Range Status   Specimen Description   Final    URINE, CATHETERIZED Performed at [facility], [address] [place], [place] [address]    Special Requests   Final    NONE Performed at [facility], [address], [place], [place] [address]    Culture 70,000 COLONIES/mL YEAST (A)  Final   Report Status 01/13/2021 FINAL  Final  Blood culture (routine single)     Status: None (Preliminary result)   Collection Time: 01/18/21  7:58 PM   Specimen: Left Antecubital; Blood  Result Value Ref Range Status   Specimen Description LEFT ANTECUBITAL  Final   Special Requests   Final    BOTTLES DRAWN AEROBIC AND ANAEROBIC Blood Culture adequate volume   Culture   Final    NO GROWTH 3 DAYS Performed at [facility], [address] [place], [place] [address]    Report Status PENDING  Incomplete  Urine culture     Status: Abnormal   Collection Time: 01/18/21  8:45 PM   Specimen: In/Out Cath Urine  Result Value Ref Range Status   Specimen Description   Final    IN/OUT CATH URINE Performed at [facility], [address], [place], [place] [address]    Special Requests   Final    NONE Performed at [facility], [address], [place], [place] [address]    Culture >=100,000 COLONIES/mL YEAST (A)  Final   Report Status 01/20/2021 FINAL  Final  SARS Coronavirus 2 by RT PCR (hospital order, performed in [place] hospital lab) Nasopharyngeal Nasopharyngeal Swab      Status: None   Collection Time: 01/18/21  9:26 PM   Specimen: Nasopharyngeal Swab  Result Value Ref Range Status   SARS Coronavirus 2 NEGATIVE NEGATIVE Final    Comment: (NOTE) SARS-CoV-2 target nucleic acids are NOT DETECTED.  The SARS-CoV-2 RNA is generally detectable in upper and lower respiratory specimens during the acute phase of infection. The lowest concentration of SARS-CoV-2 viral copies this assay can detect is 250 copies / mL. A negative result does not preclude SARS-CoV-2 infection and should not be used as the sole basis for treatment or other patient management decisions.  A negative result may occur with improper specimen collection / handling, submission of specimen other than nasopharyngeal swab, presence of viral mutation(s) within the areas targeted by this assay, and inadequate number of viral copies (<250 copies / mL). A negative result must be combined with clinical observations, patient history, and epidemiological information.  Fact Sheet for Patients:   StrictlyIdeas.no  Fact Sheet for Healthcare Providers: BankingDealers.co.za  This test is not yet approved or  cleared by the [place] FDA and has been authorized for detection and/or diagnosis of SARS-CoV-2 by FDA under an Emergency Use Authorization (EUA).  This EUA will remain in effect (meaning this test can be used) for the duration of the COVID-19 declaration under Section 564(b)(1) of the Act, 21 U.S.C. section 360bbb-3(b)(1), unless the authorization is terminated or revoked sooner.  Performed at [facility], [address] [place],  [address]   Culture, blood (single)     Status: None (Preliminary result)   Collection Time: 01/18/21 11:18 PM   Specimen: Left Antecubital; Blood  Result Value Ref Range Status   Specimen Description LEFT ANTECUBITAL  Final   Special Requests   Final    BOTTLES DRAWN AEROBIC AND ANAEROBIC Blood Culture  adequate volume   Culture   Final  NO GROWTH 3 DAYS Performed at [facility], [address], [place], [place] [address]    Report Status PENDING  Incomplete     Scheduled Meds: . sodium chloride   Intravenous Once  . azaTHIOprine  50 mg Oral Daily  . Chlorhexidine Gluconate Cloth  6 each Topical Daily  . collagenase   Topical Daily  . cycloSPORINE  75 mg Oral BID  . doxycycline  100 mg Oral Q12H  . enoxaparin (LOVENOX) injection  40 mg Subcutaneous Q24H  . folic acid  1 mg Oral Daily  . insulin aspart  0-9 Units Subcutaneous TID WC  . insulin aspart  3 Units Subcutaneous TID WC  . insulin glargine  4 Units Subcutaneous Daily  . levothyroxine  50 mcg Oral Daily  . liver oil-zinc oxide   Topical See admin instructions  . pantoprazole  40 mg Oral Daily  . polyethylene glycol  17 g Oral Daily  . predniSONE  5 mg Oral Daily  . rosuvastatin  5 mg Oral QHS  . sertraline  25 mg Oral Daily  . sodium bicarbonate  650 mg Oral BID  . cyanocobalamin  100 mcg Oral Daily   Continuous Infusions: . cefTRIAXone (ROCEPHIN)  IV 2 g (01/20/21 1923)  . lactated ringers 75 mL/hr at 01/20/21 2139    Procedures/Studies: DG Tibia/Fibula Right  Result Date: 12/31/2020 CLINICAL DATA:  Amputation stump pain, bleeding, per elite wound EXAM: RIGHT TIBIA AND FIBULA - 2 VIEW COMPARISON:  None. FINDINGS: Status post below the knee amputation of the right tibia and fibula. No bony erosion or sclerosis. Soft tissue wound of the stump tip. Vascular calcinosis. The included knee joint is well preserved. IMPRESSION: Status post below the knee amputation of the right tibia and fibula. No bony erosion or sclerosis to suggest osteomyelitis. Soft tissue wound of the stump tip. MRI is the test of choice for the evaluation of bone marrow edema and osteomyelitis if clinically suspected. Electronically Signed   By: Eddie Candle M.D.   On: 12/31/2020 16:41   DG Ankle Complete Left  Result Date: 01/12/2021 CLINICAL DATA:   Pain following fall EXAM: LEFT ANKLE COMPLETE - 3+ VIEW COMPARISON:  None. FINDINGS: Frontal, oblique, and lateral views were obtained. Bones are diffusely osteoporotic. There is evidence of an old fracture of the calcaneus with bony remodeling posteriorly. There is evidence of prior fractures of the distal tibia and fibula with remodeling. There is ankylosis at the distal tibiofibular syndesmosis. No acute fracture or joint effusion is appreciable. There is joint space narrowing medially and lateral in the ankle mortise region. Ankle mortise appears grossly intact. Postoperative clips noted in the posterior hindfoot and ankle regions. IMPRESSION: Osteoporosis. Areas of prior trauma involving the distal tibia, distal fibula, and posterior calcaneus with remodeling. No acute appearing fracture evident. Moderate osteoarthritic change. There are surgical clips in the posterior ankle and hindfoot region. Electronically Signed   By: Lowella Grip III M.D.   On: 01/12/2021 15:09   CT Head Wo Contrast  Result Date: 01/18/2021 CLINICAL DATA:  60 year old female with altered mental status. EXAM: CT HEAD WITHOUT CONTRAST TECHNIQUE: Contiguous axial images were obtained from the base of the skull through the vertex without intravenous contrast. COMPARISON:  Head CT dated 09/26/2020. FINDINGS: Brain: Mild age-related atrophy and moderate chronic microvascular ischemic changes. There is no acute intracranial hemorrhage. No mass effect midline shift. No extra-axial fluid collection. Vascular: No hyperdense vessel or unexpected calcification. Skull: Normal. Negative for fracture or focal  lesion. Sinuses/Orbits: The visualized paranasal sinuses are clear. There is complete opacification of the mastoid air cells. Other: None IMPRESSION: 1. No acute intracranial pathology. 2. Age-related atrophy and chronic microvascular ischemic changes. 3. Bilateral mastoid effusions. Electronically Signed   By: Anner Crete M.D.   On:  01/18/2021 22:01   MR PELVIS W WO CONTRAST  Result Date: 01/01/2021 CLINICAL DATA:  Chronic sacral decubitus ulcer EXAM: MRI PELVIS WITHOUT AND WITH CONTRAST TECHNIQUE: Multiplanar multisequence MR imaging of the pelvis was performed both before and after administration of intravenous contrast. CONTRAST:  44m GADAVIST GADOBUTROL 1 MMOL/ML IV SOLN COMPARISON:  CT 12/07/2020 FINDINGS: Sacral decubitus ulcer overlies the mid to distal aspect of the sacrum. Thin fluid and air collection contiguous with the ulcer base extend into the dorsal cortex of the sacrum. Bone marrow edema and confluent low T1 signal is present within the distal sacrum extending from S4 to the sacrococcygeal junction (series 8, images 20-24). Bone marrow edema extends from the S3 segment through the distal coccyx (series 4, images 19-21). Bilateral SI joints are intact. No SI joint effusion or erosive changes to suggest septic arthritis. No fracture or pelvic diastasis. There is susceptibility artifact related to right total hip arthroplasty hardware. No appreciable periprosthetic fluid collection within the limitations of the exam. Diffuse intramuscular edema throughout the visualized musculature of the pelvis compatible with a nonspecific myositis. No intramuscular fluid collection. Diffuse anasarca. Transplant kidney is seen within the low pelvis with parenchymal enhancement on postcontrast sequences. Foley catheter is present within the bladder lumen. IMPRESSION: 1. Sacral decubitus ulcer with acute osteomyelitis of the distal sacrum extending from S4 to the sacrococcygeal junction. 2. Bone marrow edema extends from the S3 segment through the distal coccyx, the peripheral extent of which may represent reactive osteitis versus early acute osteomyelitis. 3. Diffuse intramuscular edema throughout the visualized musculature of the pelvis compatible with a nonspecific myositis. No intramuscular fluid collection. 4. Diffuse anasarca. These  results will be called to the ordering clinician or representative by the Radiologist Assistant, and communication documented in the PACS or [employer] Electronically Signed   By: NDavina PokeD.O.   On: 01/01/2021 08:23   [place]  Result Date: 01/21/2021 CLINICAL DATA:  Acute kidney injury.  Prior renal transplant. EXAM: RENAL / URINARY TRACT ULTRASOUND COMPLETE COMPARISON:  Renal ultrasound 10/29/2020 and 09/27/2020 FINDINGS: Right Kidney: Not visualized sonographically. Left Kidney: Not visualized sonographically. Bladder: Foley catheter is present in the urinary bladder. The urinary bladder appears distended and there are echoes within the urinary bladder compatible with debris. Other: Incidental gallstones noted. IMPRESSION: 1. The native kidneys are not well seen sonographically. If specific evaluation of the transplant kidney is desired, consider dedicated renal transplant ultrasound potentially with Doppler assessment. 2. Distended urinary bladder despite the presence of a Foley catheter. There is some debris within the urinary bladder. 3. Cholelithiasis. Electronically Signed   By: WVan ClinesM.D.   On: 01/21/2021 09:08   DG Chest Port 1 View  Result Date: 01/18/2021 CLINICAL DATA:  60year old female with sepsis. EXAM: PORTABLE CHEST 1 VIEW COMPARISON:  Chest radiograph dated 12/25/2020. FINDINGS: Right-sided PICC with tip at the cavoatrial junction. No focal consolidation, pleural effusion or pneumothorax. Borderline cardiomegaly. No acute osseous pathology. Osteopenia. IMPRESSION: No acute cardiopulmonary process. Electronically Signed   By: AAnner CreteM.D.   On: 01/18/2021 20:57   DG CHEST PORT 1 VIEW  Result Date: 12/25/2020 CLINICAL DATA:  Infected sacral decubitus ulcer. EXAM: PORTABLE CHEST  1 VIEW COMPARISON:  12/07/2020 FINDINGS: Poor inspiration with increased patchy and linear density at both lung bases, left greater than right. The remainder of the lungs  are clear with normal vascularity. Normal sized heart. Left axillary surgical clips. Moderate left and mild right glenohumeral joint degenerative changes. IMPRESSION: Poor inspiration with bibasilar atelectasis and possible pneumonia, left greater than right. Electronically Signed   By: Claudie Revering M.D.   On: 12/25/2020 13:22   DG Knee Complete 4 Views Left  Result Date: 01/12/2021 CLINICAL DATA:  Pain following fall EXAM: LEFT KNEE - COMPLETE 4+ VIEW COMPARISON:  None. FINDINGS: Frontal, lateral, and bilateral oblique views were obtained. There is no fracture, dislocation, or joint effusion. There is joint space narrowing medially and in the patellofemoral joint regions. No erosive change. There are foci of arterial vascular calcification at multiple sites. IMPRESSION: Narrowing medially in the patellofemoral joint regions. No fracture, dislocation, or joint effusion. Multilevel atherosclerotic arterial vascular calcification. Electronically Signed   By: Lowella Grip III M.D.   On: 01/12/2021 15:07   DG Hips Bilat W or Wo Pelvis 3-4 Views  Result Date: 01/12/2021 CLINICAL DATA:  Pain following fall EXAM: DG HIP (WITH OR WITHOUT PELVIS) 3-4V BILAT COMPARISON:  None. FINDINGS: Frontal pelvis as well as frontal and lateral views of each hip joint obtained. Bones are diffusely osteoporotic. There is a total hip replacement on the right with prosthetic components well-seated. No acute fracture or dislocation. There is moderate narrowing of the left hip joint. There is multifocal arterial vascular calcification. There are clips in the pelvis. IMPRESSION: Diffuse osteoporosis. Total hip replacement on the right with prosthetic components appearing well-seated. No acute fracture or dislocation. Moderate narrowing left hip joint. Postoperative changes in pelvis. Extensive arterial vascular calcification consistent with atherosclerosis noted. Electronically Signed   By: Lowella Grip III M.D.   On:  01/12/2021 15:10   [place] EKG SITE RITE  Result Date: 01/01/2021 If Site Rite image not attached, placement could not be confirmed due to current cardiac rhythm.   Orson Eva, DO  [facility]  If 7PM-7AM, please contact night-coverage [email or web address] Password [facility] 01/21/2021, 12:36 PM   LOS: 3 days

## 2021-01-21 NOTE — Progress Notes (Signed)
[facility] night shift.  The staff reports that the patient's hemoglobin level was 6.9 g/dL, down from 7.4 g/dL on the morning labs.  A single unit PRBC transfusion has been ordered.  Tennis Must, MD.

## 2021-01-21 NOTE — Progress Notes (Signed)
Inpatient Diabetes Program Recommendations  AACE/ADA: New Consensus Statement on Inpatient Glycemic Control (2015)  Target Ranges:  Prepandial:   less than 140 mg/dL      Peak postprandial:   less than 180 mg/dL (1-2 hours)      Critically ill patients:  140 - 180 mg/dL   Lab Results  Component Value Date   GLUCAP 356 (H) 01/21/2021   HGBA1C 8.6 (H) 12/08/2020    Review of Glycemic Control Results for [name], [name] (MRN [id number]) as of 01/21/2021 10:52  Ref. Range 01/20/2021 11:34 01/20/2021 16:47 01/20/2021 21:14 01/21/2021 08:21  Glucose-Capillary Latest Ref Range: 70 - 99 mg/dL 286 (H) 291 (H) 263 (H) 356 (H)    Diabetes history: Type 1 DM Outpatient Diabetes medications: Lantus 4 units QD, Novolog 2 units TID Current orders for Inpatient glycemic control: Lantus 4 units QD, Novolog 0-9 units TID, Novolog 2 units TID Prednisone 5 mg QD  Inpatient Diabetes Program Recommendations:    Consider increasing Lantus to 3 units BID.   Thanks, Bronson Curb, MSN, RNC-OB Diabetes Coordinator [phone] (8a-5p)

## 2021-01-22 ENCOUNTER — Inpatient Hospital Stay: Payer: BC Managed Care – PPO | Admitting: Internal Medicine

## 2021-01-22 DIAGNOSIS — I9589 Other hypotension: Secondary | ICD-10-CM

## 2021-01-22 LAB — TYPE AND SCREEN
ABO/RH(D): O POS
Antibody Screen: NEGATIVE
Unit division: 0

## 2021-01-22 LAB — CBC WITH DIFFERENTIAL/PLATELET
Abs Immature Granulocytes: 0.03 10*3/uL (ref 0.00–0.07)
Basophils Absolute: 0 10*3/uL (ref 0.0–0.1)
Basophils Relative: 1 %
Eosinophils Absolute: 0.2 10*3/uL (ref 0.0–0.5)
Eosinophils Relative: 2 %
HCT: 30.5 % — ABNORMAL LOW (ref 36.0–46.0)
Hemoglobin: 9.8 g/dL — ABNORMAL LOW (ref 12.0–15.0)
Immature Granulocytes: 0 %
Lymphocytes Relative: 29 %
Lymphs Abs: 2.2 10*3/uL (ref 0.7–4.0)
MCH: 34.1 pg — ABNORMAL HIGH (ref 26.0–34.0)
MCHC: 32.1 g/dL (ref 30.0–36.0)
MCV: 106.3 fL — ABNORMAL HIGH (ref 80.0–100.0)
Monocytes Absolute: 0.7 10*3/uL (ref 0.1–1.0)
Monocytes Relative: 9 %
Neutro Abs: 4.5 10*3/uL (ref 1.7–7.7)
Neutrophils Relative %: 59 %
Platelets: 235 10*3/uL (ref 150–400)
RBC: 2.87 MIL/uL — ABNORMAL LOW (ref 3.87–5.11)
RDW: 22.8 % — ABNORMAL HIGH (ref 11.5–15.5)
WBC: 7.5 10*3/uL (ref 4.0–10.5)
nRBC: 0 % (ref 0.0–0.2)

## 2021-01-22 LAB — BASIC METABOLIC PANEL
Anion gap: 7 (ref 5–15)
BUN: 48 mg/dL — ABNORMAL HIGH (ref 8–23)
CO2: 26 mmol/L (ref 22–32)
Calcium: 9.4 mg/dL (ref 8.9–10.3)
Chloride: 100 mmol/L (ref 98–111)
Creatinine, Ser: 1.34 mg/dL — ABNORMAL HIGH (ref 0.44–1.00)
GFR, Estimated: 45 mL/min — ABNORMAL LOW (ref >60)
Glucose, Bld: 263 mg/dL — ABNORMAL HIGH (ref 70–99)
Potassium: 4.6 mmol/L (ref 3.5–5.1)
Sodium: 133 mmol/L — ABNORMAL LOW (ref 135–145)

## 2021-01-22 LAB — BPAM RBC
Blood Product Expiration Date: 202202172359
ISSUE DATE / TIME: 202201260344
Unit Type and Rh: 5100

## 2021-01-22 LAB — VITAMIN B12: Vitamin B-12: 2490 pg/mL — ABNORMAL HIGH (ref 180–914)

## 2021-01-22 LAB — GLUCOSE, CAPILLARY
Glucose-Capillary: 297 mg/dL — ABNORMAL HIGH (ref 70–99)
Glucose-Capillary: 385 mg/dL — ABNORMAL HIGH (ref 70–99)
Glucose-Capillary: 199 mg/dL — ABNORMAL HIGH (ref 70–99)

## 2021-01-22 LAB — FERRITIN: Ferritin: 980 ng/mL — ABNORMAL HIGH (ref 11–307)

## 2021-01-22 LAB — TSH: TSH: 23.383 u[IU]/mL — ABNORMAL HIGH (ref 0.350–4.500)

## 2021-01-22 LAB — IRON AND TIBC
Iron: 65 ug/dL (ref 28–170)
Saturation Ratios: 69 % — ABNORMAL HIGH (ref 10.4–31.8)
TIBC: 94 ug/dL — ABNORMAL LOW (ref 250–450)
UIBC: 29 ug/dL

## 2021-01-22 LAB — FOLATE: Folate: 15.2 ng/mL (ref >5.9)

## 2021-01-22 MED ORDER — INSULIN ASPART 100 UNIT/ML ~~LOC~~ SOLN
6.0000 [IU] | Freq: Three times a day (TID) | SUBCUTANEOUS | Status: DC
  Administered 2021-01-22 – 2021-01-24 (×6): 6 [IU] via SUBCUTANEOUS

## 2021-01-22 MED ORDER — INSULIN GLARGINE 100 UNIT/ML ~~LOC~~ SOLN
6.0000 [IU] | Freq: Every day | SUBCUTANEOUS | Status: DC
  Administered 2021-01-23: 6 [IU] via SUBCUTANEOUS
  Filled 2021-01-22 (×4): qty 0.06

## 2021-01-22 NOTE — Progress Notes (Signed)
Inpatient Diabetes Program Recommendations  AACE/ADA: New Consensus Statement on Inpatient Glycemic Control (2015)  Target Ranges:  Prepandial:   less than 140 mg/dL      Peak postprandial:   less than 180 mg/dL (1-2 hours)      Critically ill patients:  140 - 180 mg/dL   Lab Results  Component Value Date   GLUCAP 297 (H) 01/22/2021   HGBA1C 8.6 (H) 12/08/2020    Review of Glycemic Control  Diabetes history:Type 1 DM Outpatient Diabetes medications:Lantus 4 units QD, Novolog 2 units TID Current orders for Inpatient glycemic control:Lantus 4 units QD, Novolog 0-9 units TID, Novolog 3 units TID Prednisone 5 mg QD  Inpatient Diabetes Program Recommendations:    Consider increasing Lantus to 3 units BID.   Thanks, Bronson Curb, MSN, RNC-OB Diabetes Coordinator [phone] (8a-5p)

## 2021-01-22 NOTE — Progress Notes (Signed)
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 She was confused and unable to provide further information.  The patient was started on IV zosyn initially which has since been de-escalated back to CTX and doxy. Notably, the patient was hospitalized most recently from 12/07/2020 to 01/08/2021.  During that hospitalization, the patient was treated for acute osteomyelitis of the sacrum. MRI (01/02/20) confirms acute osteomyelitis and bone is exposed on exam. The patient was discharged to a skilled nursing facility with ceftriaxone 2 g daily and doxycycline 100 mg twice daily with a tentative end date of 02/12/2021.  The PICC line was placed on 01/02/2021 at that time.  That hospitalization was prolonged and complicated by aspiration pneumonia for which the patient finished a 5-day course of IV Zosyn.  In addition, the patient developed acute on chronic renal failure which ultimately improved.  ED Course:Initial vital signs were temperature100.4 F, pulse 100, respirations 16, BP 80/45 mmHg and O2 sat 99% on room air. The patient received 2000 mL of LR bolus, vancomycin and Zosyn per pharmacy. Her blood pressure normalized after the fluid bolus.  Labwork:Urinalysis was turbid, with small hemoglobinuria, ketonuria 5 and proteinuria more than 300 mg/dL. Nitrite was negative, leukocyte Estrace was large, microscopic examination showed more than 50  RBC and more than 50 WBC with rare bacteria. There was positive WBC clumps, mucus, budding yeast and crystals present. CBC showed a white count of 8.9, hemoglobin 9.6 g/dL with an MCV of 110.4 fL and platelets 299. PT 15.3, INR 1.3, PTT 41. Lactic acid was normal. CMP shows a BUN of 49 and creatinine of 1.10 mg/dL with a GFR of 57 mL/min. Total protein is 5.7 and albumin 2.3 g/dL. The rest of the CMP values are unremarkable. Coronavirus PCR was negative.  Assessment/Plan: SIRS -sepsis ruled out -She does have osteomyelitis from sacral decubitus ulcer, although she has been on IV antibiotics since her last discharge from hospital -Patient had hypotension and mild fever (100.4) on admission -Since she has a chronic PICC line- placed 01/02/21 -Blood cultureshave shown no growth  -Overall hemodynamics have improved with IV fluids -She is placed on broad-spectrum antibiotics for now.  Stage IV sacral decubitus ulcer, present on admission, with underlying osteomyelitis -Appreciate wound care input -Continue on IV antibiotics -Since blood cultures are negative, transitioned antibiotics back to Rocephin/doxycycline -With her hypoalbuminemia, it would be very challenging for her wound to heal  Debility/Failure to Thrive -this represents the sixth hospitalization since 09/26/20 -review of record shows patient has had progressive weakness with increasing need for assistance with ADLs -She has been to SNF after each of her prior hospitalizations and readmitted to hospital for a variety of medical issues including DKA, acute on CKD3, UGIB, infected sacral decubitus, infected right leg s/p R-BKA -each hospitalization has resulted in increasing debility to the point of development of stage 4 sacral decubitus -she would benefit for an LTAC stay for complex wound care and IV abx where she can be monitored closer medically   Anemia  of chronic disease -Baseline hemoglobin ~8-9 -Patient did not have  any evidence of bleeding -FOBT--neg -Suspect this is related to chronic disease/nutritional deficiencies -Suspect decline in hemoglobin related to hemodilution and frequent blood draws -B12--2490 -folate 15.2 -iron saturation 69% -Received one unit PRBC this admit  funguria -unclear clinical significance as patient is improving without antifungal tx -Continue antibiotics  Hypotension -Improved with IV hydration  Hypertension -Holding metoprolol due to soft BPs initially  Hypothyroidism -Continue on Synthroid   chronic kidney disease stage IIIa status post renal transplant -baseline creatinine 0.9-1.2 -Patient did have an episode of hypotension when she was first brought to the emergency room -Suspect that she may have developed some degree of ATN -We will continue hydration and follow renal function -Check renal ultrasound--native kidneys are not well seen sonographically -Continue immunosuppressants  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 -12/08/20--A1c 8.6 -Continued on sliding scale insulin and Lantus -Blood sugars have beentrending up -increase novolog to 6 units with meals  Urine Retention -foley placed 01/18/20  Goals of care -With poor functional nutritional status, long-term prognosis is poor -Palliative care consulted to assist with goals of care -Currently, she wishes to continue with all available treatments -She would benefit from outpatient palliative to follow for continued conversations     Status is: Inpatient  Remains inpatient appropriate because:IV treatments appropriate due to intensity of illness or inability to take PO   Dispo: The patient is from: SNF  Anticipated d/c is to: LTAC  Anticipated d/c date is: 1 day  Patient currently is medically stable to d/c.              Difficult to place patient No        Family Communication:   no Family at  bedside  Consultants:  none  Code Status:  FUL  DVT Prophylaxis:  [place] Lovenox   Procedures: As Listed in Progress Note Above  Antibiotics: Ceftriaxone>> Doxy>>   Subjective:  Patient denies fevers, chills, headache, chest pain, dyspnea, nausea, vomiting, diarrhea, abdominal pain, dysuria, hematuria, hematochezia, and melena.  Objective: Vitals:   01/21/21 0612 01/21/21 1300 01/21/21 2106 01/22/21 0601  BP: (!) 152/77 (!) 146/79 (!) 152/85 (!) 159/86  Pulse: 72 100 77 87  Resp: '18 16 18 20  '$ Temp: 98.1 F (36.7 C) 98 F (36.7 C) 98.4 F (36.9 C) 98 F (36.7 C)  TempSrc: Oral Oral Oral Oral  SpO2:  99% 99% 98%  Weight:      Height:        Intake/Output Summary (Last 24 hours) at 01/22/2021 1237 Last data filed at 01/22/2021 0900 Gross per 24 hour  Intake 505.16 ml  Output 1900 ml  Net -1394.84 ml   Weight change:  Exam:   General:  Pt is alert, follows commands appropriately, not in acute distress  HEENT: No icterus, No thrush, No neck mass, [place]/AT  Cardiovascular: RRR, S1/S2, no rubs, no gallops  Respiratory: bibasilar crackles. No wheeze  Abdomen: Soft/+BS, non tender, non distended, no guarding  Extremities: No edema, No lymphangitis, No petechiae, No rashes, no synovitis   Data Reviewed: I have personally reviewed following labs and imaging studies Basic Metabolic Panel: Recent Labs  Lab 01/18/21 1921 01/19/21 0651 01/20/21 0619 01/21/21 0025 01/21/21 0842 01/22/21 0411  NA 135 133* 134* 134*  --  133*  K 4.5 4.9 4.7 4.4  --  4.6  CL 102 101 101 102  --  100  CO2 23 18* 21* 24  --  26  GLUCOSE 87 275* 355* 268*  --  263*  BUN 49* 50* 52* 49*  --  48*  CREATININE 1.10* 1.22* 1.45* 1.32* 1.25* 1.34*  CALCIUM 10.3 10.0 9.6 9.4  --  9.4  PHOS  --   --   --  3.4  --   --    Liver Function Tests: Recent Labs  Lab 01/18/21 1921 01/19/21 0651 01/20/21 0619 01/21/21 0025  AST 18 16 13*  --   ALT '11 10 8  '$ --   ALKPHOS 120 114 86  --    BILITOT 0.7 1.5* 1.0  --   PROT 5.7* 5.2* 5.1*  --   ALBUMIN 2.3* 2.0* 2.3* 3.0*   No results for input(s): LIPASE, AMYLASE in the last 168 hours. No results for input(s): AMMONIA in the last 168 hours. Coagulation Profile: Recent Labs  Lab 01/18/21 1921  INR 1.3*   CBC: Recent Labs  Lab 01/18/21 1921 01/19/21 0651 01/20/21 0619 01/21/21 0025 01/21/21 0842 01/22/21 0411  WBC 8.9 8.4 8.4 7.8 7.1 7.5  NEUTROABS 5.5 5.4 5.7  --  4.5 4.5  HGB 9.6* 8.8* 7.4* 6.9* 9.1* 9.8*  HCT 29.6* 28.5* 23.7* 21.5* 28.6* 30.5*  MCV 110.4* 114.0* 115.0* 112.6* 107.1* 106.3*  PLT 299 266 253 224 212 235   Cardiac Enzymes: No results for input(s): CKTOTAL, CKMB, CKMBINDEX, TROPONINI in the last 168 hours. BNP: Invalid input(s): POCBNP CBG: Recent Labs  Lab 01/21/21 1201 01/21/21 1645 01/21/21 2124 01/22/21 0814 01/22/21 1156  GLUCAP 301* 204* 169* 297* 385*   HbA1C: No results for input(s): HGBA1C in the last 72 hours. Urine analysis:    Component Value Date/Time   COLORURINE YELLOW 01/18/2021 2045   APPEARANCEUR TURBID (A) 01/18/2021 2045   LABSPEC 1.014 01/18/2021 2045   PHURINE 6.0 01/18/2021 2045   GLUCOSEU NEGATIVE 01/18/2021 2045   HGBUR SMALL (A) 01/18/2021 2045   BILIRUBINUR NEGATIVE 01/18/2021 2045   KETONESUR 5 (A) 01/18/2021 2045   PROTEINUR >=300 (A) 01/18/2021 2045   NITRITE NEGATIVE 01/18/2021 2045   LEUKOCYTESUR LARGE (A) 01/18/2021 2045   Sepsis Labs: '@LABRCNTIP'$ (procalcitonin:4,lacticidven:4) ) Recent Results (from the past 240 hour(s))  Blood culture (routine single)     Status: None (Preliminary result)   Collection Time: 01/18/21  7:58 PM   Specimen: Left Antecubital; Blood  Result Value Ref Range Status   Specimen Description LEFT ANTECUBITAL  Final   Special Requests   Final    BOTTLES DRAWN AEROBIC AND ANAEROBIC Blood Culture adequate volume   Culture   Final    NO GROWTH 4 DAYS Performed at [facility], [address] [place], [place]  [address]    Report Status PENDING  Incomplete  Urine culture     Status: Abnormal   Collection Time: 01/18/21  8:45 PM   Specimen: In/Out Cath Urine  Result Value Ref Range Status   Specimen Description   Final    IN/OUT CATH URINE Performed at [facility], [address], [place], [place] [address]    Special Requests   Final    NONE Performed at [facility], [address] [place], [place] [address]    Culture >=100,000 COLONIES/mL YEAST (A)  Final   Report Status 01/20/2021 FINAL  Final  SARS Coronavirus 2 by RT PCR (hospital order, performed in [facility] hospital lab) Nasopharyngeal Nasopharyngeal Swab     Status: None   Collection Time: 01/18/21  9:26 PM   Specimen: Nasopharyngeal Swab  Result Value Ref  Range Status   SARS Coronavirus 2 NEGATIVE NEGATIVE Final    Comment: (NOTE) SARS-CoV-2 target nucleic acids are NOT DETECTED.  The SARS-CoV-2 RNA is generally detectable in upper and lower respiratory specimens during the acute phase of infection. The lowest concentration of SARS-CoV-2 viral copies this assay can detect is 250 copies / mL. A negative result does not preclude SARS-CoV-2 infection and should not be used as the sole basis for treatment or other patient management decisions.  A negative result may occur with improper specimen collection / handling, submission of specimen other than nasopharyngeal swab, presence of viral mutation(s) within the areas targeted by this assay, and inadequate number of viral copies (<250 copies / mL). A negative result must be combined with clinical observations, patient history, and epidemiological information.  Fact Sheet for Patients:   StrictlyIdeas.no  Fact Sheet for Healthcare Providers: BankingDealers.co.za  This test is not yet approved or  cleared by the [place] FDA and has been authorized for detection and/or diagnosis of SARS-CoV-2 by FDA under an Emergency Use  Authorization (EUA).  This EUA will remain in effect (meaning this test can be used) for the duration of the COVID-19 declaration under Section 564(b)(1) of the Act, 21 U.S.C. section 360bbb-3(b)(1), unless the authorization is terminated or revoked sooner.  Performed at [facility], [address] [place], [place] [address]   Culture, blood (single)     Status: None (Preliminary result)   Collection Time: 01/18/21 11:18 PM   Specimen: Left Antecubital; Blood  Result Value Ref Range Status   Specimen Description LEFT ANTECUBITAL  Final   Special Requests   Final    BOTTLES DRAWN AEROBIC AND ANAEROBIC Blood Culture adequate volume   Culture   Final    NO GROWTH 4 DAYS Performed at [facility], [address] [place], [place] [address]    Report Status PENDING  Incomplete     Scheduled Meds: . sodium chloride   Intravenous Once  . azaTHIOprine  50 mg Oral Daily  . Chlorhexidine Gluconate Cloth  6 each Topical Daily  . collagenase   Topical Daily  . cycloSPORINE  75 mg Oral BID  . doxycycline  100 mg Oral Q12H  . enoxaparin (LOVENOX) injection  40 mg Subcutaneous Q24H  . folic acid  1 mg Oral Daily  . insulin aspart  0-9 Units Subcutaneous TID WC  . insulin aspart  3 Units Subcutaneous TID WC  . insulin glargine  4 Units Subcutaneous Daily  . levothyroxine  50 mcg Oral Daily  . liver oil-zinc oxide   Topical See admin instructions  . pantoprazole  40 mg Oral BID AC  . polyethylene glycol  17 g Oral Daily  . predniSONE  5 mg Oral Daily  . rosuvastatin  5 mg Oral QHS  . sertraline  25 mg Oral Daily  . sodium bicarbonate  650 mg Oral BID  . cyanocobalamin  100 mcg Oral Daily   Continuous Infusions: . cefTRIAXone (ROCEPHIN)  IV 2 g (01/21/21 1813)    Procedures/Studies: DG Tibia/Fibula Right  Result Date: 12/31/2020 CLINICAL DATA:  Amputation stump pain, bleeding, per elite wound EXAM: RIGHT TIBIA AND FIBULA - 2 VIEW COMPARISON:  None. FINDINGS: Status post below the knee  amputation of the right tibia and fibula. No bony erosion or sclerosis. Soft tissue wound of the stump tip. Vascular calcinosis. The included knee joint is well preserved. IMPRESSION: Status post below the knee amputation of the right tibia and fibula. No bony  erosion or sclerosis to suggest osteomyelitis. Soft tissue wound of the stump tip. MRI is the test of choice for the evaluation of bone marrow edema and osteomyelitis if clinically suspected. Electronically Signed   By: Eddie Candle M.D.   On: 12/31/2020 16:41   DG Ankle Complete Left  Result Date: 01/12/2021 CLINICAL DATA:  Pain following fall EXAM: LEFT ANKLE COMPLETE - 3+ VIEW COMPARISON:  None. FINDINGS: Frontal, oblique, and lateral views were obtained. Bones are diffusely osteoporotic. There is evidence of an old fracture of the calcaneus with bony remodeling posteriorly. There is evidence of prior fractures of the distal tibia and fibula with remodeling. There is ankylosis at the distal tibiofibular syndesmosis. No acute fracture or joint effusion is appreciable. There is joint space narrowing medially and lateral in the ankle mortise region. Ankle mortise appears grossly intact. Postoperative clips noted in the posterior hindfoot and ankle regions. IMPRESSION: Osteoporosis. Areas of prior trauma involving the distal tibia, distal fibula, and posterior calcaneus with remodeling. No acute appearing fracture evident. Moderate osteoarthritic change. There are surgical clips in the posterior ankle and hindfoot region. Electronically Signed   By: Lowella Grip III M.D.   On: 01/12/2021 15:09   CT Head Wo Contrast  Result Date: 01/18/2021 CLINICAL DATA:  60 year old female with altered mental status. EXAM: CT HEAD WITHOUT CONTRAST TECHNIQUE: Contiguous axial images were obtained from the base of the skull through the vertex without intravenous contrast. COMPARISON:  Head CT dated 09/26/2020. FINDINGS: Brain: Mild age-related atrophy and moderate  chronic microvascular ischemic changes. There is no acute intracranial hemorrhage. No mass effect midline shift. No extra-axial fluid collection. Vascular: No hyperdense vessel or unexpected calcification. Skull: Normal. Negative for fracture or focal lesion. Sinuses/Orbits: The visualized paranasal sinuses are clear. There is complete opacification of the mastoid air cells. Other: None IMPRESSION: 1. No acute intracranial pathology. 2. Age-related atrophy and chronic microvascular ischemic changes. 3. Bilateral mastoid effusions. Electronically Signed   By: Anner Crete M.D.   On: 01/18/2021 22:01   MR PELVIS W WO CONTRAST  Result Date: 01/01/2021 CLINICAL DATA:  Chronic sacral decubitus ulcer EXAM: MRI PELVIS WITHOUT AND WITH CONTRAST TECHNIQUE: Multiplanar multisequence MR imaging of the pelvis was performed both before and after administration of intravenous contrast. CONTRAST:  71m GADAVIST GADOBUTROL 1 MMOL/ML IV SOLN COMPARISON:  CT 12/07/2020 FINDINGS: Sacral decubitus ulcer overlies the mid to distal aspect of the sacrum. Thin fluid and air collection contiguous with the ulcer base extend into the dorsal cortex of the sacrum. Bone marrow edema and confluent low T1 signal is present within the distal sacrum extending from S4 to the sacrococcygeal junction (series 8, images 20-24). Bone marrow edema extends from the S3 segment through the distal coccyx (series 4, images 19-21). Bilateral SI joints are intact. No SI joint effusion or erosive changes to suggest septic arthritis. No fracture or pelvic diastasis. There is susceptibility artifact related to right total hip arthroplasty hardware. No appreciable periprosthetic fluid collection within the limitations of the exam. Diffuse intramuscular edema throughout the visualized musculature of the pelvis compatible with a nonspecific myositis. No intramuscular fluid collection. Diffuse anasarca. Transplant kidney is seen within the low pelvis with  parenchymal enhancement on postcontrast sequences. Foley catheter is present within the bladder lumen. IMPRESSION: 1. Sacral decubitus ulcer with acute osteomyelitis of the distal sacrum extending from S4 to the sacrococcygeal junction. 2. Bone marrow edema extends from the S3 segment through the distal coccyx, the peripheral extent of which may represent  reactive osteitis versus early acute osteomyelitis. 3. Diffuse intramuscular edema throughout the visualized musculature of the pelvis compatible with a nonspecific myositis. No intramuscular fluid collection. 4. Diffuse anasarca. These results will be called to the ordering clinician or representative by the Radiologist Assistant, and communication documented in the PACS or [employer]. Electronically Signed   By: Davina Poke D.O.   On: 01/01/2021 08:23   US RENAL  Result Date: 01/21/2021 CLINICAL DATA:  Acute kidney injury.  Prior renal transplant. EXAM: RENAL / URINARY TRACT ULTRASOUND COMPLETE COMPARISON:  Renal ultrasound 10/29/2020 and 09/27/2020 FINDINGS: Right Kidney: Not visualized sonographically. Left Kidney: Not visualized sonographically. Bladder: Foley catheter is present in the urinary bladder. The urinary bladder appears distended and there are echoes within the urinary bladder compatible with debris. Other: Incidental gallstones noted. IMPRESSION: 1. The native kidneys are not well seen sonographically. If specific evaluation of the transplant kidney is desired, consider dedicated renal transplant ultrasound potentially with Doppler assessment. 2. Distended urinary bladder despite the presence of a Foley catheter. There is some debris within the urinary bladder. 3. Cholelithiasis. Electronically Signed   By: Van Clines M.D.   On: 01/21/2021 09:08   DG Chest Port 1 View  Result Date: 01/18/2021 CLINICAL DATA:  60 year old female with sepsis. EXAM: PORTABLE CHEST 1 VIEW COMPARISON:  Chest radiograph dated 12/25/2020.  FINDINGS: Right-sided PICC with tip at the cavoatrial junction. No focal consolidation, pleural effusion or pneumothorax. Borderline cardiomegaly. No acute osseous pathology. Osteopenia. IMPRESSION: No acute cardiopulmonary process. Electronically Signed   By: Anner Crete M.D.   On: 01/18/2021 20:57   DG CHEST PORT 1 VIEW  Result Date: 12/25/2020 CLINICAL DATA:  Infected sacral decubitus ulcer. EXAM: PORTABLE CHEST 1 VIEW COMPARISON:  12/07/2020 FINDINGS: Poor inspiration with increased patchy and linear density at both lung bases, left greater than right. The remainder of the lungs are clear with normal vascularity. Normal sized heart. Left axillary surgical clips. Moderate left and mild right glenohumeral joint degenerative changes. IMPRESSION: Poor inspiration with bibasilar atelectasis and possible pneumonia, left greater than right. Electronically Signed   By: Claudie Revering M.D.   On: 12/25/2020 13:22   DG Knee Complete 4 Views Left  Result Date: 01/12/2021 CLINICAL DATA:  Pain following fall EXAM: LEFT KNEE - COMPLETE 4+ VIEW COMPARISON:  None. FINDINGS: Frontal, lateral, and bilateral oblique views were obtained. There is no fracture, dislocation, or joint effusion. There is joint space narrowing medially and in the patellofemoral joint regions. No erosive change. There are foci of arterial vascular calcification at multiple sites. IMPRESSION: Narrowing medially in the patellofemoral joint regions. No fracture, dislocation, or joint effusion. Multilevel atherosclerotic arterial vascular calcification. Electronically Signed   By: Lowella Grip III M.D.   On: 01/12/2021 15:07   DG Hips Bilat W or Wo Pelvis 3-4 Views  Result Date: 01/12/2021 CLINICAL DATA:  Pain following fall EXAM: DG HIP (WITH OR WITHOUT PELVIS) 3-4V BILAT COMPARISON:  None. FINDINGS: Frontal pelvis as well as frontal and lateral views of each hip joint obtained. Bones are diffusely osteoporotic. There is a total hip  replacement on the right with prosthetic components well-seated. No acute fracture or dislocation. There is moderate narrowing of the left hip joint. There is multifocal arterial vascular calcification. There are clips in the pelvis. IMPRESSION: Diffuse osteoporosis. Total hip replacement on the right with prosthetic components appearing well-seated. No acute fracture or dislocation. Moderate narrowing left hip joint. Postoperative changes in pelvis. Extensive arterial vascular calcification consistent with  atherosclerosis noted. Electronically Signed   By: Lowella Grip III M.D.   On: 01/12/2021 15:10   [place] EKG SITE RITE  Result Date: 01/01/2021 If Site Rite image not attached, placement could not be confirmed due to current cardiac rhythm.   Orson Eva, DO  [facility]  If 7PM-7AM, please contact night-coverage [email or web address] Password [facility] 01/22/2021, 12:37 PM   LOS: 4 days

## 2021-01-23 ENCOUNTER — Inpatient Hospital Stay (HOSPITAL_COMMUNITY): Payer: BC Managed Care – PPO

## 2021-01-23 DIAGNOSIS — N179 Acute kidney failure, unspecified: Secondary | ICD-10-CM

## 2021-01-23 LAB — BASIC METABOLIC PANEL
Anion gap: 7 (ref 5–15)
BUN: 55 mg/dL — ABNORMAL HIGH (ref 8–23)
CO2: 25 mmol/L (ref 22–32)
Calcium: 9.3 mg/dL (ref 8.9–10.3)
Chloride: 99 mmol/L (ref 98–111)
Creatinine, Ser: 1.18 mg/dL — ABNORMAL HIGH (ref 0.44–1.00)
GFR, Estimated: 53 mL/min — ABNORMAL LOW (ref >60)
Glucose, Bld: 364 mg/dL — ABNORMAL HIGH (ref 70–99)
Potassium: 5 mmol/L (ref 3.5–5.1)
Sodium: 131 mmol/L — ABNORMAL LOW (ref 135–145)

## 2021-01-23 LAB — CBC WITH DIFFERENTIAL/PLATELET
Abs Immature Granulocytes: 0.02 10*3/uL (ref 0.00–0.07)
Basophils Absolute: 0 10*3/uL (ref 0.0–0.1)
Basophils Relative: 1 %
Eosinophils Absolute: 0.1 10*3/uL (ref 0.0–0.5)
Eosinophils Relative: 2 %
HCT: 30.5 % — ABNORMAL LOW (ref 36.0–46.0)
Hemoglobin: 9.8 g/dL — ABNORMAL LOW (ref 12.0–15.0)
Immature Granulocytes: 0 %
Lymphocytes Relative: 31 %
Lymphs Abs: 2.1 10*3/uL (ref 0.7–4.0)
MCH: 34.4 pg — ABNORMAL HIGH (ref 26.0–34.0)
MCHC: 32.1 g/dL (ref 30.0–36.0)
MCV: 107 fL — ABNORMAL HIGH (ref 80.0–100.0)
Monocytes Absolute: 0.6 10*3/uL (ref 0.1–1.0)
Monocytes Relative: 9 %
Neutro Abs: 4 10*3/uL (ref 1.7–7.7)
Neutrophils Relative %: 57 %
Platelets: 206 10*3/uL (ref 150–400)
RBC: 2.85 MIL/uL — ABNORMAL LOW (ref 3.87–5.11)
RDW: 22.1 % — ABNORMAL HIGH (ref 11.5–15.5)
WBC: 7 10*3/uL (ref 4.0–10.5)
nRBC: 0 % (ref 0.0–0.2)

## 2021-01-23 LAB — GLUCOSE, CAPILLARY
Glucose-Capillary: 389 mg/dL — ABNORMAL HIGH (ref 70–99)
Glucose-Capillary: 389 mg/dL — ABNORMAL HIGH (ref 70–99)
Glucose-Capillary: 293 mg/dL — ABNORMAL HIGH (ref 70–99)
Glucose-Capillary: 83 mg/dL (ref 70–99)

## 2021-01-23 LAB — CULTURE, BLOOD (SINGLE)
Culture: NO GROWTH
Culture: NO GROWTH
Special Requests: ADEQUATE
Special Requests: ADEQUATE

## 2021-01-23 MED ORDER — INSULIN ASPART 100 UNIT/ML ~~LOC~~ SOLN
0.0000 [IU] | Freq: Three times a day (TID) | SUBCUTANEOUS | Status: DC
  Administered 2021-01-23: 8 [IU] via SUBCUTANEOUS
  Administered 2021-01-24: 3 [IU] via SUBCUTANEOUS

## 2021-01-23 MED ORDER — INSULIN GLARGINE 100 UNIT/ML ~~LOC~~ SOLN
15.0000 [IU] | Freq: Every day | SUBCUTANEOUS | Status: DC
  Administered 2021-01-24: 15 [IU] via SUBCUTANEOUS
  Filled 2021-01-23 (×3): qty 0.15

## 2021-01-23 MED ORDER — INSULIN ASPART 100 UNIT/ML ~~LOC~~ SOLN: 0.0000 [IU] | Freq: Every day | SUBCUTANEOUS | Status: DC

## 2021-01-23 NOTE — Progress Notes (Signed)
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 Shewasconfused and unable to provide further information. The patient was started on IV zosyn initially which has since been de-escalated back to CTX and doxy. Notably, the patient was hospitalized most recently from 12/07/2020 to 01/08/2021. During that hospitalization, the patient was treated for acute osteomyelitis of the sacrum. MRI (01/02/20) confirms acute osteomyelitis and bone is exposed on exam.The patient was discharged to a skilled nursing facility with ceftriaxone 2 g daily and doxycycline 100 mg twice daily with a tentative end date of 02/12/2021. The PICC line was placed on 01/02/2021 at that time. That hospitalization was prolonged and complicated by aspiration pneumonia for which the patient finished a 5-day course of IV Zosyn. In addition, thepatient developed acute on chronic renal failure which ultimately improved.  ED Course:Initial vital signs were temperature100.4 F, pulse 100, respirations 16, BP 80/45 mmHg and O2 sat 99% on room air. The patient received 2000 mL of LR bolus, vancomycin and Zosyn per pharmacy. Her blood pressure normalized after the fluid bolus.  Labwork:Urinalysis was turbid, with small hemoglobinuria, ketonuria 5 and proteinuria more than 300 mg/dL. Nitrite was negative, leukocyte Estrace was large, microscopic examination showed more than 50 RBC  and more than 50 WBC with rare bacteria. There was positive WBC clumps, mucus, budding yeast and crystals present. CBC showed a white count of 8.9, hemoglobin 9.6 g/dL with an MCV of 110.4 fL and platelets 299. PT 15.3, INR 1.3, PTT 41. Lactic acid was normal. CMP shows a BUN of 49 and creatinine of 1.10 mg/dL with a GFR of 57 mL/min. Total protein is 5.7 and albumin 2.3 g/dL. The rest of the CMP values are unremarkable. Coronavirus PCR was negative.  Assessment/Plan: SIRS -sepsis ruled out -She does have osteomyelitis from sacral decubitus ulcer, although she has been on IV antibiotics since her last discharge from hospital -Patient had hypotension and mild fever(100.4)on admission -Since she has a chronic PICC line- placed 01/02/21 -Blood cultureshave shown no growth  -Overall hemodynamics have improved with IV fluids -She is placed on broad-spectrum antibiotics for now. -resolved  Stage IV sacral decubitus ulcer, present on admission, with underlying osteomyelitis -Appreciate wound care input -Continue on IV antibiotics -Since blood cultures are negative, transitioned antibiotics back to Rocephin/doxycycline -With her hypoalbuminemia, it would be very challenging for her wound to heal -foley removed 01/22/21, but patient insists replacing foley due to her incontinence and stage 4 sacral decubitus--she understands and accepts the risks for CAUTI  Debility/Failure to Thrive -this represents the sixth hospitalization since 09/26/20 -review of record shows patient has had progressive weakness with increasing need for assistance with ADLs -She has been to SNF after each of her prior hospitalizations and readmitted to hospital for a variety of medical issues including DKA, acute on CKD3, UGIB, infected sacral decubitus, infected right leg s/p R-BKA -each hospitalization has resulted in increasing debility to the point of development of stage 4 sacral decubitus -she would benefit for  an LTAC stay for complex wound care and IV abx  where she can be monitored closer medically  Anemia of chronic disease -Baseline hemoglobin ~8-9 -Patient did not have any evidence of bleeding -FOBT--neg -Suspect this is related to chronic disease/nutritional deficiencies -Suspect decline in hemoglobin related to hemodilution and frequent blood draws -B12--2490 -folate 15.2 -iron saturation 69% -Received one unit PRBC this admit  funguria -unclear clinical significance as patient is improving without antifungal tx -Continue antibiotics  Hypotension -Improved with IV hydration  Hypertension -Holding metoprololdue to soft BPs initially  Hypothyroidism -Continue on Synthroid  chronic kidney disease stage IIIastatus post renal transplant -baseline creatinine 0.9-1.2 -Patient did have an episode of hypotension when she was first brought to the emergency room -Suspect that she may have developed some degree of ATN -We will continue hydration and follow renal function -Check renal ultrasound--native kidneys are not well seen sonographically -Continue immunosuppressants -overall improved, back to baseline  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 -12/08/20--A1c 8.6 -Continued on sliding scale insulin and Lantus -Blood sugars have beentrending up -increase novolog to 6 units with meals -increase lantus to 15 units  Urine Retention -foley placed 01/18/20 -pt states she had foley when she was at [facility] -foley removed 01/22/21, but patient insists replacing foley due to her incontinence and stage 4 sacral decubitus--she understands and accepts the risks for CAUTI  Arm weakness -MR brain  Goals of care -With poor functional nutritional status, long-term prognosis is poor -Palliative care consulted to assist with goals of care -Currently, she wishes to continue with all available treatments -She would benefit from outpatient palliative to follow for continued  conversations     Status is: Inpatient  Remains inpatient appropriate because:IV treatments appropriate due to intensity of illness or inability to take PO   Dispo: The patient is from:SNF Anticipated d/c is [id number] Anticipated d/c date is: 1 day Patient currently is medically stable to d/c. Difficult to place patient No        Family Communication: spouse updated 01/23/21  Consultants:none  Code Status: FUL  DVT Prophylaxis: [place] Lovenox   Procedures: As Listed in Progress Note Above  Antibiotics: Ceftriaxone>> Doxy>>      Subjective: Patient denies fevers, chills, headache, chest pain, dyspnea, nausea, vomiting, diarrhea, abdominal pain, dysuria, hematuria, hematochezia, and melena.  She states her arms, R>L feel week this am.  No visual change.  No headache. No dysarthria   Objective: Vitals:   01/22/21 1414 01/22/21 2100 01/23/21 0531 01/23/21 1437  BP: (!) 141/79 (!) 148/80 (!) 154/88 133/75  Pulse: 92 79 87 88  Resp: '16 20 17 17  '$ Temp: 98.2 F (36.8 C) 98.3 F (36.8 C) 98.1 F (36.7 C) 98.4 F (36.9 C)  TempSrc: Oral Oral  Oral  SpO2: 99% 100% 97% 99%  Weight:      Height:        Intake/Output Summary (Last 24 hours) at 01/23/2021 1639 Last data filed at 01/23/2021 1440 Gross per 24 hour  Intake 720 ml  Output 375 ml  Net 345 ml   Weight change:  Exam:   General:  Pt is alert, follows commands appropriately, not in acute distress  HEENT: No icterus, No thrush, No neck mass, [place]/AT  Cardiovascular: RRR, S1/S2, no rubs, no gallops  Respiratory: bibasilar crackles. No wheeze  Abdomen: Soft/+BS, non tender, non distended, no guarding  Extremities: No edema, No lymphangitis, No petechiae, No rashes, no synovitis   Data Reviewed: I have personally reviewed following labs and imaging studies Basic Metabolic Panel: Recent Labs  Lab 01/19/21  [id number]  01/20/21 [id number] 01/21/21 0025 01/21/21 0842 01/22/21 0411 01/23/21 0348  NA 133* 134* 134*  --  133* 131*  K 4.9 4.7 4.4  --  4.6 5.0  CL 101 101 102  --  100 99  CO2 18* 21* 24  --  26 25  GLUCOSE 275* 355* 268*  --  263* 364*  BUN 50* 52* 49*  --  48* 55*  CREATININE 1.22* 1.45* 1.32* 1.25* 1.34* 1.18*  CALCIUM 10.0 9.6 9.4  --  9.4 9.3  PHOS  --   --  3.4  --   --   --    Liver Function Tests: Recent Labs  Lab 01/18/21 1921 01/19/21 0651 01/20/21 0619 01/21/21 0025  AST 18 16 13*  --   ALT '11 10 8  '$ --   ALKPHOS 120 114 86  --   BILITOT 0.7 1.5* 1.0  --   PROT 5.7* 5.2* 5.1*  --   ALBUMIN 2.3* 2.0* 2.3* 3.0*   No results for input(s): LIPASE, AMYLASE in the last 168 hours. No results for input(s): AMMONIA in the last 168 hours. Coagulation Profile: Recent Labs  Lab 01/18/21 1921  INR 1.3*   CBC: Recent Labs  Lab 01/19/21 0651 01/20/21 0619 01/21/21 0025 01/21/21 0842 01/22/21 0411 01/23/21 0348  WBC 8.4 8.4 7.8 7.1 7.5 7.0  NEUTROABS 5.4 5.7  --  4.5 4.5 4.0  HGB 8.8* 7.4* 6.9* 9.1* 9.8* 9.8*  HCT 28.5* 23.7* 21.5* 28.6* 30.5* 30.5*  MCV 114.0* 115.0* 112.6* 107.1* 106.3* 107.0*  PLT 266 253 224 212 235 206   Cardiac Enzymes: No results for input(s): CKTOTAL, CKMB, CKMBINDEX, TROPONINI in the last 168 hours. BNP: Invalid input(s): POCBNP CBG: Recent Labs  Lab 01/22/21 1156 01/22/21 1612 01/23/21 0754 01/23/21 1119 01/23/21 1554  GLUCAP 385* 199* 389* 389* 293*   HbA1C: No results for input(s): HGBA1C in the last 72 hours. Urine analysis:    Component Value Date/Time   COLORURINE YELLOW 01/18/2021 2045   APPEARANCEUR TURBID (A) 01/18/2021 2045   LABSPEC 1.014 01/18/2021 2045   PHURINE 6.0 01/18/2021 2045   GLUCOSEU NEGATIVE 01/18/2021 2045   HGBUR SMALL (A) 01/18/2021 2045   BILIRUBINUR NEGATIVE 01/18/2021 2045   KETONESUR 5 (A) 01/18/2021 2045   PROTEINUR >=300 (A) 01/18/2021 2045   NITRITE NEGATIVE 01/18/2021 2045   LEUKOCYTESUR  LARGE (A) 01/18/2021 2045   Sepsis Labs: '@LABRCNTIP'$ (procalcitonin:4,lacticidven:4) ) Recent Results (from the past 240 hour(s))  Blood culture (routine single)     Status: None   Collection Time: 01/18/21  7:58 PM   Specimen: Left Antecubital; Blood  Result Value Ref Range Status   Specimen Description LEFT ANTECUBITAL  Final   Special Requests   Final    BOTTLES DRAWN AEROBIC AND ANAEROBIC Blood Culture adequate volume   Culture   Final    NO GROWTH 5 DAYS Performed at [facility], [address], [place], [place] [address]    Report Status 01/23/2021 FINAL  Final  Urine culture     Status: Abnormal   Collection Time: 01/18/21  8:45 PM   Specimen: In/Out Cath Urine  Result Value Ref Range Status   Specimen Description   Final    IN/OUT CATH URINE Performed at [facility], [address], [place], [place] [address]    Special Requests   Final    NONE Performed at [facility], [address], [place], [place] [address]    Culture >=100,000 COLONIES/mL YEAST (A)  Final  Report Status 01/20/2021 FINAL  Final  SARS Coronavirus 2 by RT PCR (hospital order, performed in [facility] hospital lab) Nasopharyngeal Nasopharyngeal Swab     Status: None   Collection Time: 01/18/21  9:26 PM   Specimen: Nasopharyngeal Swab  Result Value Ref Range Status   SARS Coronavirus 2 NEGATIVE NEGATIVE Final    Comment: (NOTE) SARS-CoV-2 target nucleic acids are NOT DETECTED.  The SARS-CoV-2 RNA is generally detectable in upper and lower respiratory specimens during the acute phase of infection. The lowest concentration of SARS-CoV-2 viral copies this assay can detect is 250 copies / mL. A negative result does not preclude SARS-CoV-2 infection and should not be used as the sole basis for treatment or other patient management decisions.  A negative result may occur with improper specimen collection / handling, submission of specimen other than nasopharyngeal swab, presence of viral mutation(s)  within the areas targeted by this assay, and inadequate number of viral copies (<250 copies / mL). A negative result must be combined with clinical observations, patient history, and epidemiological information.  Fact Sheet for Patients:   StrictlyIdeas.no  Fact Sheet for Healthcare Providers: BankingDealers.co.za  This test is not yet approved or  cleared by the [place] FDA and has been authorized for detection and/or diagnosis of SARS-CoV-2 by FDA under an Emergency Use Authorization (EUA).  This EUA will remain in effect (meaning this test can be used) for the duration of the COVID-19 declaration under Section 564(b)(1) of the Act, 21 U.S.C. section 360bbb-3(b)(1), unless the authorization is terminated or revoked sooner.  Performed at [facility], [address] [place], [place] [address]   Culture, blood (single)     Status: None   Collection Time: 01/18/21 11:18 PM   Specimen: Left Antecubital; Blood  Result Value Ref Range Status   Specimen Description LEFT ANTECUBITAL  Final   Special Requests   Final    BOTTLES DRAWN AEROBIC AND ANAEROBIC Blood Culture adequate volume   Culture   Final    NO GROWTH 5 DAYS Performed at [facility], [address], [place], [place] [address]    Report Status 01/23/2021 FINAL  Final     Scheduled Meds: . sodium chloride   Intravenous Once  . azaTHIOprine  50 mg Oral Daily  . Chlorhexidine Gluconate Cloth  6 each Topical Daily  . collagenase   Topical Daily  . cycloSPORINE  75 mg Oral BID  . doxycycline  100 mg Oral Q12H  . enoxaparin (LOVENOX) injection  40 mg Subcutaneous Q24H  . folic acid  1 mg Oral Daily  . insulin aspart  0-15 Units Subcutaneous TID WC  . insulin aspart  0-5 Units Subcutaneous QHS  . insulin aspart  6 Units Subcutaneous TID WC  . [START ON 01/24/2021] insulin glargine  15 Units Subcutaneous Daily  . levothyroxine  50 mcg Oral Daily  . liver oil-zinc oxide    Topical See admin instructions  . pantoprazole  40 mg Oral BID AC  . polyethylene glycol  17 g Oral Daily  . predniSONE  5 mg Oral Daily  . rosuvastatin  5 mg Oral QHS  . sertraline  25 mg Oral Daily  . sodium bicarbonate  650 mg Oral BID  . cyanocobalamin  100 mcg Oral Daily   Continuous Infusions: . cefTRIAXone (ROCEPHIN)  IV 2 g (01/22/21 1804)    Procedures/Studies: DG Tibia/Fibula Right  Result Date: 12/31/2020 CLINICAL DATA:  Amputation stump pain, bleeding, per elite wound EXAM: RIGHT  TIBIA AND FIBULA - 2 VIEW COMPARISON:  None. FINDINGS: Status post below the knee amputation of the right tibia and fibula. No bony erosion or sclerosis. Soft tissue wound of the stump tip. Vascular calcinosis. The included knee joint is well preserved. IMPRESSION: Status post below the knee amputation of the right tibia and fibula. No bony erosion or sclerosis to suggest osteomyelitis. Soft tissue wound of the stump tip. MRI is the test of choice for the evaluation of bone marrow edema and osteomyelitis if clinically suspected. Electronically Signed   By: Eddie Candle M.D.   On: 12/31/2020 16:41   DG Ankle Complete Left  Result Date: 01/12/2021 CLINICAL DATA:  Pain following fall EXAM: LEFT ANKLE COMPLETE - 3+ VIEW COMPARISON:  None. FINDINGS: Frontal, oblique, and lateral views were obtained. Bones are diffusely osteoporotic. There is evidence of an old fracture of the calcaneus with bony remodeling posteriorly. There is evidence of prior fractures of the distal tibia and fibula with remodeling. There is ankylosis at the distal tibiofibular syndesmosis. No acute fracture or joint effusion is appreciable. There is joint space narrowing medially and lateral in the ankle mortise region. Ankle mortise appears grossly intact. Postoperative clips noted in the posterior hindfoot and ankle regions. IMPRESSION: Osteoporosis. Areas of prior trauma involving the distal tibia, distal fibula, and posterior calcaneus  with remodeling. No acute appearing fracture evident. Moderate osteoarthritic change. There are surgical clips in the posterior ankle and hindfoot region. Electronically Signed   By: Lowella Grip III M.D.   On: 01/12/2021 15:09   CT Head Wo Contrast  Result Date: 01/18/2021 CLINICAL DATA:  60 year old female with altered mental status. EXAM: CT HEAD WITHOUT CONTRAST TECHNIQUE: Contiguous axial images were obtained from the base of the skull through the vertex without intravenous contrast. COMPARISON:  Head CT dated 09/26/2020. FINDINGS: Brain: Mild age-related atrophy and moderate chronic microvascular ischemic changes. There is no acute intracranial hemorrhage. No mass effect midline shift. No extra-axial fluid collection. Vascular: No hyperdense vessel or unexpected calcification. Skull: Normal. Negative for fracture or focal lesion. Sinuses/Orbits: The visualized paranasal sinuses are clear. There is complete opacification of the mastoid air cells. Other: None IMPRESSION: 1. No acute intracranial pathology. 2. Age-related atrophy and chronic microvascular ischemic changes. 3. Bilateral mastoid effusions. Electronically Signed   By: Anner Crete M.D.   On: 01/18/2021 22:01   MR PELVIS W WO CONTRAST  Result Date: 01/01/2021 CLINICAL DATA:  Chronic sacral decubitus ulcer EXAM: MRI PELVIS WITHOUT AND WITH CONTRAST TECHNIQUE: Multiplanar multisequence MR imaging of the pelvis was performed both before and after administration of intravenous contrast. CONTRAST:  69m GADAVIST GADOBUTROL 1 MMOL/ML IV SOLN COMPARISON:  CT 12/07/2020 FINDINGS: Sacral decubitus ulcer overlies the mid to distal aspect of the sacrum. Thin fluid and air collection contiguous with the ulcer base extend into the dorsal cortex of the sacrum. Bone marrow edema and confluent low T1 signal is present within the distal sacrum extending from S4 to the sacrococcygeal junction (series 8, images 20-24). Bone marrow edema extends from the  S3 segment through the distal coccyx (series 4, images 19-21). Bilateral SI joints are intact. No SI joint effusion or erosive changes to suggest septic arthritis. No fracture or pelvic diastasis. There is susceptibility artifact related to right total hip arthroplasty hardware. No appreciable periprosthetic fluid collection within the limitations of the exam. Diffuse intramuscular edema throughout the visualized musculature of the pelvis compatible with a nonspecific myositis. No intramuscular fluid collection. Diffuse anasarca. Transplant kidney is  seen within the low pelvis with parenchymal enhancement on postcontrast sequences. Foley catheter is present within the bladder lumen. IMPRESSION: 1. Sacral decubitus ulcer with acute osteomyelitis of the distal sacrum extending from S4 to the sacrococcygeal junction. 2. Bone marrow edema extends from the S3 segment through the distal coccyx, the peripheral extent of which may represent reactive osteitis versus early acute osteomyelitis. 3. Diffuse intramuscular edema throughout the visualized musculature of the pelvis compatible with a nonspecific myositis. No intramuscular fluid collection. 4. Diffuse anasarca. These results will be called to the ordering clinician or representative by the Radiologist Assistant, and communication documented in the PACS or [employer]. Electronically Signed   By: Davina Poke D.O.   On: 01/01/2021 08:23   US RENAL  Result Date: 01/21/2021 CLINICAL DATA:  Acute kidney injury.  Prior renal transplant. EXAM: RENAL / URINARY TRACT ULTRASOUND COMPLETE COMPARISON:  Renal ultrasound 10/29/2020 and 09/27/2020 FINDINGS: Right Kidney: Not visualized sonographically. Left Kidney: Not visualized sonographically. Bladder: Foley catheter is present in the urinary bladder. The urinary bladder appears distended and there are echoes within the urinary bladder compatible with debris. Other: Incidental gallstones noted. IMPRESSION: 1. The  native kidneys are not well seen sonographically. If specific evaluation of the transplant kidney is desired, consider dedicated renal transplant ultrasound potentially with Doppler assessment. 2. Distended urinary bladder despite the presence of a Foley catheter. There is some debris within the urinary bladder. 3. Cholelithiasis. Electronically Signed   By: Van Clines M.D.   On: 01/21/2021 09:08   DG Chest Port 1 View  Result Date: 01/18/2021 CLINICAL DATA:  60 year old female with sepsis. EXAM: PORTABLE CHEST 1 VIEW COMPARISON:  Chest radiograph dated 12/25/2020. FINDINGS: Right-sided PICC with tip at the cavoatrial junction. No focal consolidation, pleural effusion or pneumothorax. Borderline cardiomegaly. No acute osseous pathology. Osteopenia. IMPRESSION: No acute cardiopulmonary process. Electronically Signed   By: Anner Crete M.D.   On: 01/18/2021 20:57   DG CHEST PORT 1 VIEW  Result Date: 12/25/2020 CLINICAL DATA:  Infected sacral decubitus ulcer. EXAM: PORTABLE CHEST 1 VIEW COMPARISON:  12/07/2020 FINDINGS: Poor inspiration with increased patchy and linear density at both lung bases, left greater than right. The remainder of the lungs are clear with normal vascularity. Normal sized heart. Left axillary surgical clips. Moderate left and mild right glenohumeral joint degenerative changes. IMPRESSION: Poor inspiration with bibasilar atelectasis and possible pneumonia, left greater than right. Electronically Signed   By: Claudie Revering M.D.   On: 12/25/2020 13:22   DG Knee Complete 4 Views Left  Result Date: 01/12/2021 CLINICAL DATA:  Pain following fall EXAM: LEFT KNEE - COMPLETE 4+ VIEW COMPARISON:  None. FINDINGS: Frontal, lateral, and bilateral oblique views were obtained. There is no fracture, dislocation, or joint effusion. There is joint space narrowing medially and in the patellofemoral joint regions. No erosive change. There are foci of arterial vascular calcification at  multiple sites. IMPRESSION: Narrowing medially in the patellofemoral joint regions. No fracture, dislocation, or joint effusion. Multilevel atherosclerotic arterial vascular calcification. Electronically Signed   By: Lowella Grip III M.D.   On: 01/12/2021 15:07   DG Hips Bilat W or Wo Pelvis 3-4 Views  Result Date: 01/12/2021 CLINICAL DATA:  Pain following fall EXAM: DG HIP (WITH OR WITHOUT PELVIS) 3-4V BILAT COMPARISON:  None. FINDINGS: Frontal pelvis as well as frontal and lateral views of each hip joint obtained. Bones are diffusely osteoporotic. There is a total hip replacement on the right with prosthetic components well-seated. No acute  fracture or dislocation. There is moderate narrowing of the left hip joint. There is multifocal arterial vascular calcification. There are clips in the pelvis. IMPRESSION: Diffuse osteoporosis. Total hip replacement on the right with prosthetic components appearing well-seated. No acute fracture or dislocation. Moderate narrowing left hip joint. Postoperative changes in pelvis. Extensive arterial vascular calcification consistent with atherosclerosis noted. Electronically Signed   By: Lowella Grip III M.D.   On: 01/12/2021 15:10   [place] EKG SITE RITE  Result Date: 01/01/2021 If Site Rite image not attached, placement could not be confirmed due to current cardiac rhythm.   Orson Eva, DO  [facility]  If 7PM-7AM, please contact night-coverage [email or web address] Password [facility] 01/23/2021, 4:39 PM   LOS: 5 days

## 2021-01-23 NOTE — TOC Progression Note (Addendum)
Transition of Care [facility]) - Progression Note    Patient Details  Name: [name] MRN: [id number] Date of Birth: November 11, 1961  Transition of Care [facility]) CM/SW Contact  Shade Flood, LCSW Phone Number: 01/23/2021, 12:29 PM  Clinical Narrative:     TOC following. Received update from Raquel Sarna at [facility] today stating that they have insurance authorization for pt to admit. Per Raquel Sarna, she is hopeful they will have a bed available for pt tomorrow.   Updated MD and pt's husband. Weekend TOC will follow and assist with transfer to [facility].  66: Spoke with pt's husband again to update that LTAC hoping to have a bed tomorrow for pt. Also discussed recommendation from Palliative APNP for outpatient palliative follow up at dc. Discussed provider options and referred to Bevely Palmer at [facility] as requested. Per Latanya Presser, they will follow up with pt and her husband to coordinate care after dc from LTAC.  Expected Discharge Plan: Long Term Acute Care (LTAC) Barriers to Discharge: Other (comment) (LTAC when bed available)  Expected Discharge Plan and Services Expected Discharge Plan: [place] (LTAC) In-house Referral: NA Discharge Planning Services: NA   Living arrangements for the past 2 months: Skilled Nursing [place]                 DME Arranged: N/A DME Agency: NA       HH Arranged: IV Antibiotics,RN HH Agency: Other - See comment ([facility] and OP Wound Care with [facility])         Social Determinants of Health (SDOH) Interventions    Readmission Risk Interventions Readmission Risk Prevention Plan 01/23/2021  Transportation Screening Complete  Medication Review Press photographer) Complete  SW Recovery Care/Counseling Consult Complete  Palliative Care Screening Complete  Skilled Nursing Facility Complete

## 2021-01-24 DIAGNOSIS — T8612 Kidney transplant failure: Secondary | ICD-10-CM | POA: Diagnosis not present

## 2021-01-24 DIAGNOSIS — E785 Hyperlipidemia, unspecified: Secondary | ICD-10-CM | POA: Diagnosis not present

## 2021-01-24 DIAGNOSIS — Z8616 Personal history of COVID-19: Secondary | ICD-10-CM | POA: Diagnosis not present

## 2021-01-24 DIAGNOSIS — I69398 Other sequelae of cerebral infarction: Secondary | ICD-10-CM | POA: Diagnosis not present

## 2021-01-24 DIAGNOSIS — M4628 Osteomyelitis of vertebra, sacral and sacrococcygeal region: Secondary | ICD-10-CM | POA: Diagnosis not present

## 2021-01-24 DIAGNOSIS — E039 Hypothyroidism, unspecified: Secondary | ICD-10-CM | POA: Diagnosis not present

## 2021-01-24 DIAGNOSIS — R2689 Other abnormalities of gait and mobility: Secondary | ICD-10-CM | POA: Diagnosis not present

## 2021-01-24 DIAGNOSIS — F32A Depression, unspecified: Secondary | ICD-10-CM | POA: Diagnosis not present

## 2021-01-24 DIAGNOSIS — B3749 Other urogenital candidiasis: Secondary | ICD-10-CM | POA: Diagnosis not present

## 2021-01-24 DIAGNOSIS — I129 Hypertensive chronic kidney disease with stage 1 through stage 4 chronic kidney disease, or unspecified chronic kidney disease: Secondary | ICD-10-CM | POA: Diagnosis not present

## 2021-01-24 DIAGNOSIS — Z89511 Acquired absence of right leg below knee: Secondary | ICD-10-CM | POA: Diagnosis not present

## 2021-01-24 DIAGNOSIS — E43 Unspecified severe protein-calorie malnutrition: Secondary | ICD-10-CM | POA: Diagnosis not present

## 2021-01-24 DIAGNOSIS — K219 Gastro-esophageal reflux disease without esophagitis: Secondary | ICD-10-CM | POA: Diagnosis not present

## 2021-01-24 DIAGNOSIS — U071 COVID-19: Secondary | ICD-10-CM | POA: Diagnosis not present

## 2021-01-24 DIAGNOSIS — Z9483 Pancreas transplant status: Secondary | ICD-10-CM | POA: Diagnosis not present

## 2021-01-24 DIAGNOSIS — Z48815 Encounter for surgical aftercare following surgery on the digestive system: Secondary | ICD-10-CM | POA: Diagnosis not present

## 2021-01-24 DIAGNOSIS — L89154 Pressure ulcer of sacral region, stage 4: Secondary | ICD-10-CM | POA: Diagnosis not present

## 2021-01-24 DIAGNOSIS — R5381 Other malaise: Secondary | ICD-10-CM | POA: Diagnosis not present

## 2021-01-24 DIAGNOSIS — E1022 Type 1 diabetes mellitus with diabetic chronic kidney disease: Secondary | ICD-10-CM | POA: Diagnosis not present

## 2021-01-24 DIAGNOSIS — D849 Immunodeficiency, unspecified: Secondary | ICD-10-CM | POA: Diagnosis not present

## 2021-01-24 DIAGNOSIS — F329 Major depressive disorder, single episode, unspecified: Secondary | ICD-10-CM | POA: Diagnosis not present

## 2021-01-24 DIAGNOSIS — R627 Adult failure to thrive: Secondary | ICD-10-CM | POA: Diagnosis not present

## 2021-01-24 DIAGNOSIS — N183 Chronic kidney disease, stage 3 unspecified: Secondary | ICD-10-CM | POA: Diagnosis not present

## 2021-01-24 DIAGNOSIS — E46 Unspecified protein-calorie malnutrition: Secondary | ICD-10-CM | POA: Diagnosis not present

## 2021-01-24 DIAGNOSIS — M6281 Muscle weakness (generalized): Secondary | ICD-10-CM | POA: Diagnosis not present

## 2021-01-24 DIAGNOSIS — Z931 Gastrostomy status: Secondary | ICD-10-CM | POA: Diagnosis not present

## 2021-01-24 DIAGNOSIS — L89159 Pressure ulcer of sacral region, unspecified stage: Secondary | ICD-10-CM | POA: Diagnosis not present

## 2021-01-24 DIAGNOSIS — Z681 Body mass index (BMI) 19 or less, adult: Secondary | ICD-10-CM | POA: Diagnosis not present

## 2021-01-24 DIAGNOSIS — E8809 Other disorders of plasma-protein metabolism, not elsewhere classified: Secondary | ICD-10-CM | POA: Diagnosis not present

## 2021-01-24 DIAGNOSIS — I10 Essential (primary) hypertension: Secondary | ICD-10-CM | POA: Diagnosis not present

## 2021-01-24 DIAGNOSIS — N189 Chronic kidney disease, unspecified: Secondary | ICD-10-CM | POA: Diagnosis not present

## 2021-01-24 DIAGNOSIS — B49 Unspecified mycosis: Secondary | ICD-10-CM | POA: Diagnosis not present

## 2021-01-24 DIAGNOSIS — E119 Type 2 diabetes mellitus without complications: Secondary | ICD-10-CM | POA: Diagnosis not present

## 2021-01-24 DIAGNOSIS — Z940 Kidney transplant status: Secondary | ICD-10-CM | POA: Diagnosis not present

## 2021-01-24 LAB — GLUCOSE, CAPILLARY
Glucose-Capillary: 89 mg/dL (ref 70–99)
Glucose-Capillary: 189 mg/dL — ABNORMAL HIGH (ref 70–99)

## 2021-01-24 LAB — CBC WITH DIFFERENTIAL/PLATELET
Abs Immature Granulocytes: 0.04 10*3/uL (ref 0.00–0.07)
Basophils Absolute: 0 10*3/uL (ref 0.0–0.1)
Basophils Relative: 1 %
Eosinophils Absolute: 0.3 10*3/uL (ref 0.0–0.5)
Eosinophils Relative: 3 %
HCT: 29.1 % — ABNORMAL LOW (ref 36.0–46.0)
Hemoglobin: 9.5 g/dL — ABNORMAL LOW (ref 12.0–15.0)
Immature Granulocytes: 1 %
Lymphocytes Relative: 36 %
Lymphs Abs: 3.1 10*3/uL (ref 0.7–4.0)
MCH: 34.8 pg — ABNORMAL HIGH (ref 26.0–34.0)
MCHC: 32.6 g/dL (ref 30.0–36.0)
MCV: 106.6 fL — ABNORMAL HIGH (ref 80.0–100.0)
Monocytes Absolute: 0.5 10*3/uL (ref 0.1–1.0)
Monocytes Relative: 6 %
Neutro Abs: 4.6 10*3/uL (ref 1.7–7.7)
Neutrophils Relative %: 53 %
Platelets: 207 10*3/uL (ref 150–400)
RBC: 2.73 MIL/uL — ABNORMAL LOW (ref 3.87–5.11)
RDW: 21.5 % — ABNORMAL HIGH (ref 11.5–15.5)
WBC: 8.6 10*3/uL (ref 4.0–10.5)
nRBC: 0 % (ref 0.0–0.2)

## 2021-01-24 MED ORDER — HYDROCODONE-ACETAMINOPHEN 5-325 MG PO TABS: 1.0000 | ORAL_TABLET | ORAL | 0 refills | Status: DC | PRN

## 2021-01-24 MED ORDER — LEVOTHYROXINE SODIUM 100 MCG PO TABS: 100.0000 ug | ORAL_TABLET | Freq: Every day | ORAL | Status: DC

## 2021-01-24 MED ORDER — INSULIN GLARGINE 100 UNIT/ML ~~LOC~~ SOLN: 10.0000 [IU] | Freq: Every day | SUBCUTANEOUS | 0 refills | Status: DC

## 2021-01-24 NOTE — TOC Transition Note (Signed)
Transition of Care [facility]) - CM/SW Discharge Note   Patient Details  Name: Lanise Sedano MRN: [id number] Date of Birth: Feb 20, 1961  Transition of Care [facility]) CM/SW Contact:  Iona Beard, [place] Phone Number: 01/24/2021, 10:14 AM   Clinical Narrative:    CSW confirmed with Raquel Sarna at [facility] that pt can transfer to their facility at 1:30pm. Pt will be going to room 409. CSW to provide RN with numbers for report. Accepting physician is Dr. Sheppard Coil. Per Raquel Sarna, CSW does not need to send d/c summary she will retreive from [facility] when completed. Pt to be transported by EMS. TOC signing off.   Final next level of care: Long Term Acute Care (LTAC) Barriers to Discharge: Barriers Resolved   Patient Goals and CMS Choice Patient states their goals for this hospitalization and ongoing recovery are:: Go to [facility] CMS Medicare.gov Compare Post Acute Care list provided to:: Patient Choice offered to / list presented to : NA  Discharge Placement              Patient chooses bed at: Other - please specify in the comment section below: ([facility]) Patient to be transferred to facility by: EMS Name of family member notified: Sania Seijo Patient and family notified of of transfer: 01/24/21  Discharge Plan and Services In-house Referral: NA Discharge Planning Services: NA            DME Arranged: N/A DME Agency: NA       HH Arranged: NA HH Agency: NA        Social Determinants of Health (SDOH) Interventions     Readmission Risk Interventions Readmission Risk Prevention Plan 01/23/2021  Transportation Screening Complete  Medication Review Press photographer) Complete  SW Recovery Care/Counseling Consult Complete  Palliative Care Screening Complete  Skilled Nursing Facility Complete

## 2021-01-24 NOTE — Discharge Summary (Addendum)
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 Shewasconfused and unable to provide further information. The patient was started on IV zosyn initially which has since been de-escalated back to CTX and doxy. Notably, the patient was hospitalized most recently from 12/07/2020 to 01/08/2021. During that hospitalization, the patient was treated for acute osteomyelitis of the sacrum. MRI (01/02/20) confirms acute osteomyelitis and bone is exposed on exam.The patient was discharged to a skilled nursing facility with ceftriaxone 2 g daily and doxycycline 100 mg twice daily with a tentative end date of 02/12/2021. The PICC line was placed on 01/02/2021 at that time. That hospitalization was prolonged and complicated by aspiration pneumonia for which the patient finished a 5-day course of IV Zosyn. In addition, thepatient developed acute on chronic renal failure which ultimately improved.  ED Course:Initial vital signs were temperature100.4 F, pulse 100, respirations 16, BP 80/45 mmHg and O2 sat 99% on room air. The patient received 2000 mL of LR bolus, vancomycin and Zosyn per pharmacy. Her blood pressure normalized after the fluid  bolus.  Labwork:Urinalysis was turbid, with small hemoglobinuria, ketonuria 5 and proteinuria more than 300 mg/dL. Nitrite was negative, leukocyte Estrace was large, microscopic examination showed more than 50 RBC and more than 50 WBC with rare bacteria. There was positive WBC clumps, mucus, budding yeast and crystals present. CBC showed a white count of 8.9, hemoglobin 9.6 g/dL with an MCV of 110.4 fL and platelets 299. PT 15.3, INR 1.3, PTT 41. Lactic acid was normal. CMP shows a BUN of 49 and creatinine of 1.10 mg/dL with a GFR of 57 mL/min. Total protein is 5.7 and albumin 2.3 g/dL. The rest of the CMP values are unremarkable. Coronavirus PCR was negative.  Discharge Diagnoses:  SIRS -sepsis ruled out -She does have osteomyelitis from sacral decubitus ulcer, although she has been on IV antibiotics since her last dischargefrom hospital -Patient had hypotension and mild fever(100.4)on admission -Since she has a chronic PICC line- placed 01/02/21 -Blood cultureshave shown no growth  -Overall hemodynamics have improved with IV fluids -She is placed on broad-spectrum antibiotics for now. -resolved  Stage IV sacral decubitus ulcer, present on admission, with underlying osteomyelitis -Appreciate wound care input -Continue on IV antibiotics -Since blood cultures are negative,transitionedantibiotics back to Rocephin/doxycycline -With her hypoalbuminemia, it would be very challenging for her wound to heal -foley removed 01/22/21, but patient insists replacing foley due to her incontinence and stage 4 sacral decubitus--she understands and accepts the risks for CAUTI -ceftriaxone 2 g daily and doxycycline 100 mg twice daily with a tentative end date of 02/12/2021  Debility/Failure to Thrive -this represents the sixth hospitalization since 09/26/20 -review of record shows patient has had progressive weakness with increasing need for assistance with ADLs -She has been to SNF after each  of her prior hospitalizations and readmitted to hospital for a variety of medical issues including DKA, acute on CKD3, UGIB,  infected sacral decubitus, infected right leg s/p R-BKA -each hospitalization has resulted in increasing debility to the point of development of stage 4 sacral decubitus -she would benefit for an LTAC stay for complex wound care and IV abx where she can be monitored closer medically  Anemiaof chronic disease -Baseline hemoglobin ~8-9 -Patient did not have any evidence of bleeding -FOBT--neg -Suspect this is related to chronic disease/nutritional deficiencies -Suspect decline in hemoglobin related to hemodilutionand frequent blood draws -B12--2490 -folate 15.2 -iron saturation 69% -Received one unit PRBC this admit and Hgb remained stable there after--Hgb 9.5 on day of dc  funguria -unclear clinical significance as patient is improving without antifungal tx -Continue antibiotics  Hypotension -Improved with IV hydration  Hypertension -Holding metoprololdue to soft BPs initially  Hypothyroidism -Continue on Synthroid  chronic kidney disease stage IIIastatus post renal transplant -baseline creatinine 0.9-1.2 -Patient did have an episode of hypotension when she was first brought to the emergency room -Suspect that she may have developed some degree of ATN -We will continue hydration and follow renal function -Check renal ultrasound--native kidneys are not well seen sonographically -Continue immunosuppressants -overall improved, back to baseline  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 -12/08/20--A1c8.6 -Continued on sliding scale insulin and Lantus -Blood sugars have beentrending up -increased novolog to 6 units with meals during hospitalization -increase lantus to 15 units  Urine Retention -foley placed 01/18/20 -pt states she had foley when she was at [facility] -foley removed 01/22/21, but patient insists replacing foley due to  her incontinence and stage 4 sacral decubitus--she understands and accepts the risks for CAUTI  Arm weakness -MR brain--neg for acute findings -no neck pain -due to deconditiong--continue OT -overall improved  Goals of care -With poor functional nutritional status, long-term prognosis is poor -Palliative care consulted to assist with goals of care -Currently, she wishes to continue with all available treatments -She would benefit from outpatient palliative to follow for continued conversations      Discharge Instructions   Allergies as of 01/24/2021   No Known Allergies     Medication List    STOP taking these medications   lisinopril 5 MG tablet Commonly known as: ZESTRIL     TAKE these medications   acetaminophen 325 MG tablet Commonly known as: TYLENOL Take 2 tablets (650 mg total) by mouth every 6 (six) hours as needed for mild pain (or Fever >/= 101). What changed: reasons to take this   atorvastatin 20 MG tablet Commonly known as: LIPITOR Take 20 mg by mouth daily.   azaTHIOprine 50 MG tablet Commonly known as: IMURAN Take 50 mg by mouth daily.   cefTRIAXone  IVPB Commonly known as: ROCEPHIN Inject 2 g into the vein daily. Indication:  Osteomyelitis First Dose: Yes Last Day of Therapy:  02/12/2021 Labs - Once weekly:  CBC/D and BMP, Labs - Every other week:  ESR and CRP Method of administration: IV Push Method of administration may be changed at the discretion of home infusion pharmacist based upon assessment of the patient and/or caregiver's ability to self-administer the medication ordered.   collagenase ointment Commonly known as: SANTYL Apply topically daily.   cyanocobalamin 100 MCG tablet Take 1 tablet (100 mcg total) by mouth daily.   cycloSPORINE 25 MG capsule Commonly known as: SANDIMMUNE Take 75 mg by mouth 2 (two) times daily.   doxycycline 100 MG capsule Commonly known as: VIBRAMYCIN Take 1 capsule (100 mg total) by mouth 2  (two) times daily. Treat through 02/12/21  feeding supplement Liqd Take 237 mLs by mouth 2 (two) times daily between meals.   nutrition supplement (JUVEN) Pack Take 1 packet by mouth 2 (two) times daily between meals.   folic acid 1 MG tablet Commonly known as: FOLVITE Take 1 tablet (1 mg total) by mouth daily.   HYDROcodone-acetaminophen 5-325 MG tablet Commonly known as: NORCO/VICODIN Take 1 tablet by mouth every 4 (four) hours as needed for moderate pain.   insulin glargine 100 UNIT/ML injection Commonly known as: LANTUS Inject 0.1 mLs (10 Units total) into the skin daily. What changed: how much to take   insulin lispro 100 UNIT/ML injection Commonly known as: HUMALOG Inject 2 Units into the skin 3 (three) times daily before meals. Sliding scale if 200-250=2 units;251-300=4units;301-350=6 units;351-400=8 units;104-450=10 units;451-500=12 units subcutaneously before meals and at bedtime for DM   levothyroxine 50 MCG tablet Commonly known as: SYNTHROID Take 1 tablet (50 mcg total) by mouth daily.   liver oil-zinc oxide 40 % ointment Commonly known as: DESITIN Apply topically daily.   metoprolol tartrate 25 MG tablet Commonly known as: LOPRESSOR Take 12.5 mg by mouth 2 (two) times daily.   multivitamin with minerals Tabs tablet Take 1 tablet by mouth daily.   omeprazole 20 MG capsule Commonly known as: PRILOSEC Take 20 mg by mouth daily.   pantoprazole 40 MG tablet Commonly known as: PROTONIX Take 1 tablet (40 mg total) by mouth 2 (two) times daily.   polyethylene glycol 17 g packet Commonly known as: MIRALAX / GLYCOLAX Take 17 g by mouth daily.   predniSONE 5 MG tablet Commonly known as: DELTASONE Take 5 mg by mouth daily.   rosuvastatin 5 MG tablet Commonly known as: CRESTOR Take 1 tablet (5 mg total) by mouth at bedtime.   senna-docusate 8.6-50 MG tablet Commonly known as: Senokot-S Take 2 tablets by mouth 2 (two) times daily as needed for mild  constipation.   sertraline 25 MG tablet Commonly known as: ZOLOFT Take 1 tablet (25 mg total) by mouth daily.   sodium bicarbonate 650 MG tablet Take 1 tablet (650 mg total) by mouth 2 (two) times daily.       No Known Allergies  Consultations:  Palliative medicine   Procedures/Studies: DG Tibia/Fibula Right  Result Date: 12/31/2020 CLINICAL DATA:  Amputation stump pain, bleeding, per [facility] wound EXAM: RIGHT TIBIA AND FIBULA - 2 VIEW COMPARISON:  None. FINDINGS: Status post below the knee amputation of the right tibia and fibula. No bony erosion or sclerosis. Soft tissue wound of the stump tip. Vascular calcinosis. The included knee joint is well preserved. IMPRESSION: Status post below the knee amputation of the right tibia and fibula. No bony erosion or sclerosis to suggest osteomyelitis. Soft tissue wound of the stump tip. MRI is the test of choice for the evaluation of bone marrow edema and osteomyelitis if clinically suspected. Electronically Signed   By: Eddie Candle M.D.   On: 12/31/2020 16:41   DG Ankle Complete Left  Result Date: 01/12/2021 CLINICAL DATA:  Pain following fall EXAM: LEFT ANKLE COMPLETE - 3+ VIEW COMPARISON:  None. FINDINGS: Frontal, oblique, and lateral views were obtained. Bones are diffusely osteoporotic. There is evidence of an old fracture of the calcaneus with bony remodeling posteriorly. There is evidence of prior fractures of the distal tibia and fibula with remodeling. There is ankylosis at the distal tibiofibular syndesmosis. No acute fracture or joint effusion is appreciable. There is joint space narrowing medially and lateral in the ankle mortise region. Ankle  mortise appears grossly intact. Postoperative clips noted in the posterior hindfoot and ankle regions. IMPRESSION: Osteoporosis. Areas of prior trauma involving the distal tibia, distal fibula, and posterior calcaneus with remodeling. No acute appearing fracture evident. Moderate osteoarthritic  change. There are surgical clips in the posterior ankle and hindfoot region. Electronically Signed   By: Lowella Grip III M.D.   On: 01/12/2021 15:09   CT Head Wo Contrast  Result Date: 01/18/2021 CLINICAL DATA:  60 year old female with altered mental status. EXAM: CT HEAD WITHOUT CONTRAST TECHNIQUE: Contiguous axial images were obtained from the base of the skull through the vertex without intravenous contrast. COMPARISON:  Head CT dated 09/26/2020. FINDINGS: Brain: Mild age-related atrophy and moderate chronic microvascular ischemic changes. There is no acute intracranial hemorrhage. No mass effect midline shift. No extra-axial fluid collection. Vascular: No hyperdense vessel or unexpected calcification. Skull: Normal. Negative for fracture or focal lesion. Sinuses/Orbits: The visualized paranasal sinuses are clear. There is complete opacification of the mastoid air cells. Other: None IMPRESSION: 1. No acute intracranial pathology. 2. Age-related atrophy and chronic microvascular ischemic changes. 3. Bilateral mastoid effusions. Electronically Signed   By: Anner Crete M.D.   On: 01/18/2021 22:01   MR BRAIN WO CONTRAST  Result Date: 01/23/2021 CLINICAL DATA:  Transient ischemic attack (TIA); neuro deficit, acute, stroke suspected; right greater than left arm weakness. Altered mental status for 2 days. EXAM: MRI HEAD WITHOUT CONTRAST TECHNIQUE: Multiplanar, multiecho pulse sequences of the brain and surrounding structures were obtained without intravenous contrast. COMPARISON:  Head CT 01/18/2021.  Head CT 09/26/2020. FINDINGS: Brain: Moderate cerebral and cerebellar atrophy, advanced for age. Atrophy of the brainstem is also questioned (most notably of the midbrain). 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 Moderate multifocal T2/FLAIR hyperintensity within the cerebral white matter is nonspecific, but compatible chronic small vessel ischemic  disease. There is no acute infarct. No evidence of intracranial mass. No chronic intracranial blood products. No extra-axial fluid collection. No midline shift. Vascular: Expected proximal arterial flow voids. Skull and upper cervical spine: No focal calvarial marrow lesion. C4 vertebral compression deformity. C3-C4 grade 1 anterolisthesis. Incompletely assessed cervical spondylosis. Sinuses/Orbits: Bilateral lens replacements. Trace bilateral ethmoid sinus mucosal thickening. Bilateral middle ear/mastoid effusions. IMPRESSION: No evidence of acute intracranial abnormality, including acute infarction. 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 Background moderate cerebral white matter chronic small vessel ischemic disease. Moderate cerebral and cerebellar atrophy, advanced for age. Brainstem atrophy is also questioned (particularly of the midbrain). Age-indeterminate C4 vertebral compression deformity. Clinical correlation is recommended. Bilateral middle ear/mastoid effusions. Electronically Signed   By: Kellie Simmering DO   On: 01/23/2021 17:26   MR PELVIS W WO CONTRAST  Result Date: 01/01/2021 CLINICAL DATA:  Chronic sacral decubitus ulcer EXAM: MRI PELVIS WITHOUT AND WITH CONTRAST TECHNIQUE: Multiplanar multisequence MR imaging of the pelvis was performed both before and after administration of intravenous contrast. CONTRAST:  43m GADAVIST GADOBUTROL 1 MMOL/ML IV SOLN COMPARISON:  CT 12/07/2020 FINDINGS: Sacral decubitus ulcer overlies the mid to distal aspect of the sacrum. Thin fluid and air collection contiguous with the ulcer base extend into the dorsal cortex of the sacrum. Bone marrow edema and confluent low T1 signal is present within the distal sacrum extending from S4 to the sacrococcygeal junction (series 8, images 20-24). Bone marrow edema extends from the S3 segment through the distal coccyx (series 4, images 19-21). Bilateral SI joints are  intact.  No SI joint effusion or erosive changes to suggest septic arthritis. No fracture or pelvic diastasis. There is susceptibility artifact related to right total hip arthroplasty hardware. No appreciable periprosthetic fluid collection within the limitations of the exam. Diffuse intramuscular edema throughout the visualized musculature of the pelvis compatible with a nonspecific myositis. No intramuscular fluid collection. Diffuse anasarca. Transplant kidney is seen within the low pelvis with parenchymal enhancement on postcontrast sequences. Foley catheter is present within the bladder lumen. IMPRESSION: 1. Sacral decubitus ulcer with acute osteomyelitis of the distal sacrum extending from S4 to the sacrococcygeal junction. 2. Bone marrow edema extends from the S3 segment through the distal coccyx, the peripheral extent of which may represent reactive osteitis versus early acute osteomyelitis. 3. Diffuse intramuscular edema throughout the visualized musculature of the pelvis compatible with a nonspecific myositis. No intramuscular fluid collection. 4. Diffuse anasarca. These results will be called to the ordering clinician or representative by the Radiologist Assistant, and communication documented in the PACS or [employer]. Electronically Signed   By: Davina Poke D.O.   On: 01/01/2021 08:23   US RENAL  Result Date: 01/21/2021 CLINICAL DATA:  Acute kidney injury.  Prior renal transplant. EXAM: RENAL / URINARY TRACT ULTRASOUND COMPLETE COMPARISON:  Renal ultrasound 10/29/2020 and 09/27/2020 FINDINGS: Right Kidney: Not visualized sonographically. Left Kidney: Not visualized sonographically. Bladder: Foley catheter is present in the urinary bladder. The urinary bladder appears distended and there are echoes within the urinary bladder compatible with debris. Other: Incidental gallstones noted. IMPRESSION: 1. The native kidneys are not well seen sonographically. If specific evaluation of the  transplant kidney is desired, consider dedicated renal transplant ultrasound potentially with Doppler assessment. 2. Distended urinary bladder despite the presence of a Foley catheter. There is some debris within the urinary bladder. 3. Cholelithiasis. Electronically Signed   By: Van Clines M.D.   On: 01/21/2021 09:08   DG Chest Port 1 View  Result Date: 01/18/2021 CLINICAL DATA:  60 year old female with sepsis. EXAM: PORTABLE CHEST 1 VIEW COMPARISON:  Chest radiograph dated 12/25/2020. FINDINGS: Right-sided PICC with tip at the cavoatrial junction. No focal consolidation, pleural effusion or pneumothorax. Borderline cardiomegaly. No acute osseous pathology. Osteopenia. IMPRESSION: No acute cardiopulmonary process. Electronically Signed   By: Anner Crete M.D.   On: 01/18/2021 20:57   DG CHEST PORT 1 VIEW  Result Date: 12/25/2020 CLINICAL DATA:  Infected sacral decubitus ulcer. EXAM: PORTABLE CHEST 1 VIEW COMPARISON:  12/07/2020 FINDINGS: Poor inspiration with increased patchy and linear density at both lung bases, left greater than right. The remainder of the lungs are clear with normal vascularity. Normal sized heart. Left axillary surgical clips. Moderate left and mild right glenohumeral joint degenerative changes. IMPRESSION: Poor inspiration with bibasilar atelectasis and possible pneumonia, left greater than right. Electronically Signed   By: Claudie Revering M.D.   On: 12/25/2020 13:22   DG Knee Complete 4 Views Left  Result Date: 01/12/2021 CLINICAL DATA:  Pain following fall EXAM: LEFT KNEE - COMPLETE 4+ VIEW COMPARISON:  None. FINDINGS: Frontal, lateral, and bilateral oblique views were obtained. There is no fracture, dislocation, or joint effusion. There is joint space narrowing medially and in the patellofemoral joint regions. No erosive change. There are foci of arterial vascular calcification at multiple sites. IMPRESSION: Narrowing medially in the patellofemoral joint regions. No  fracture, dislocation, or joint effusion. Multilevel atherosclerotic arterial vascular calcification. Electronically Signed   By: Lowella Grip III M.D.   On: 01/12/2021 15:07   DG Hips Bilat  W or Wo Pelvis 3-4 Views  Result Date: 01/12/2021 CLINICAL DATA:  Pain following fall EXAM: DG HIP (WITH OR WITHOUT PELVIS) 3-4V BILAT COMPARISON:  None. FINDINGS: Frontal pelvis as well as frontal and lateral views of each hip joint obtained. Bones are diffusely osteoporotic. There is a total hip replacement on the right with prosthetic components well-seated. No acute fracture or dislocation. There is moderate narrowing of the left hip joint. There is multifocal arterial vascular calcification. There are clips in the pelvis. IMPRESSION: Diffuse osteoporosis. Total hip replacement on the right with prosthetic components appearing well-seated. No acute fracture or dislocation. Moderate narrowing left hip joint. Postoperative changes in pelvis. Extensive arterial vascular calcification consistent with atherosclerosis noted. Electronically Signed   By: Lowella Grip III M.D.   On: 01/12/2021 15:10   [place] EKG SITE RITE  Result Date: 01/01/2021 If Site Rite image not attached, placement could not be confirmed due to current cardiac rhythm.       Discharge Exam: Vitals:   01/23/21 1437 01/23/21 2156  BP: 133/75 122/72  Pulse: 88 81  Resp: 17 16  Temp: 98.4 F (36.9 C) 98.5 F (36.9 C)  SpO2: 99% 99%   Vitals:   01/22/21 2100 01/23/21 0531 01/23/21 1437 01/23/21 2156  BP: (!) 148/80 (!) 154/88 133/75 122/72  Pulse: 79 87 88 81  Resp: '20 17 17 16  ' Temp: 98.3 F (36.8 C) 98.1 F (36.7 C) 98.4 F (36.9 C) 98.5 F (36.9 C)  TempSrc: Oral  Oral Oral  SpO2: 100% 97% 99% 99%  Weight:      Height:        General: Pt is alert, awake, not in acute distress Cardiovascular: RRR, S1/S2 +, no rubs, no gallops Respiratory: bibasilar rales. No wheeze Abdominal: Soft, NT, ND, bowel sounds  + Extremities: no edema, no cyanosis   The results of significant diagnostics from this hospitalization (including imaging, microbiology, ancillary and laboratory) are listed below for reference.    Significant Diagnostic Studies: DG Tibia/Fibula Right  Result Date: 12/31/2020 CLINICAL DATA:  Amputation stump pain, bleeding, per [facility] wound EXAM: RIGHT TIBIA AND FIBULA - 2 VIEW COMPARISON:  None. FINDINGS: Status post below the knee amputation of the right tibia and fibula. No bony erosion or sclerosis. Soft tissue wound of the stump tip. Vascular calcinosis. The included knee joint is well preserved. IMPRESSION: Status post below the knee amputation of the right tibia and fibula. No bony erosion or sclerosis to suggest osteomyelitis. Soft tissue wound of the stump tip. MRI is the test of choice for the evaluation of bone marrow edema and osteomyelitis if clinically suspected. Electronically Signed   By: Eddie Candle M.D.   On: 12/31/2020 16:41   DG Ankle Complete Left  Result Date: 01/12/2021 CLINICAL DATA:  Pain following fall EXAM: LEFT ANKLE COMPLETE - 3+ VIEW COMPARISON:  None. FINDINGS: Frontal, oblique, and lateral views were obtained. Bones are diffusely osteoporotic. There is evidence of an old fracture of the calcaneus with bony remodeling posteriorly. There is evidence of prior fractures of the distal tibia and fibula with remodeling. There is ankylosis at the distal tibiofibular syndesmosis. No acute fracture or joint effusion is appreciable. There is joint space narrowing medially and lateral in the ankle mortise region. Ankle mortise appears grossly intact. Postoperative clips noted in the posterior hindfoot and ankle regions. IMPRESSION: Osteoporosis. Areas of prior trauma involving the distal tibia, distal fibula, and posterior calcaneus with remodeling. No acute appearing fracture evident. Moderate osteoarthritic  change. There are surgical clips in the posterior ankle and hindfoot  region. Electronically Signed   By: Lowella Grip III M.D.   On: 01/12/2021 15:09   CT Head Wo Contrast  Result Date: 01/18/2021 CLINICAL DATA:  60 year old female with altered mental status. EXAM: CT HEAD WITHOUT CONTRAST TECHNIQUE: Contiguous axial images were obtained from the base of the skull through the vertex without intravenous contrast. COMPARISON:  Head CT dated 09/26/2020. FINDINGS: Brain: Mild age-related atrophy and moderate chronic microvascular ischemic changes. There is no acute intracranial hemorrhage. No mass effect midline shift. No extra-axial fluid collection. Vascular: No hyperdense vessel or unexpected calcification. Skull: Normal. Negative for fracture or focal lesion. Sinuses/Orbits: The visualized paranasal sinuses are clear. There is complete opacification of the mastoid air cells. Other: None IMPRESSION: 1. No acute intracranial pathology. 2. Age-related atrophy and chronic microvascular ischemic changes. 3. Bilateral mastoid effusions. Electronically Signed   By: Anner Crete M.D.   On: 01/18/2021 22:01   MR BRAIN WO CONTRAST  Result Date: 01/23/2021 CLINICAL DATA:  Transient ischemic attack (TIA); neuro deficit, acute, stroke suspected; right greater than left arm weakness. Altered mental status for 2 days. EXAM: MRI HEAD WITHOUT CONTRAST TECHNIQUE: Multiplanar, multiecho pulse sequences of the brain and surrounding structures were obtained without intravenous contrast. COMPARISON:  Head CT 01/18/2021.  Head CT 09/26/2020. FINDINGS: Brain: Moderate cerebral and cerebellar atrophy, advanced for age. Atrophy of the brainstem is also questioned (most notably of the midbrain). 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 Moderate multifocal T2/FLAIR hyperintensity within the cerebral white matter is nonspecific, but compatible chronic small vessel ischemic disease. There is no acute infarct. No evidence of intracranial  mass. No chronic intracranial blood products. No extra-axial fluid collection. No midline shift. Vascular: Expected proximal arterial flow voids. Skull and upper cervical spine: No focal calvarial marrow lesion. C4 vertebral compression deformity. C3-C4 grade 1 anterolisthesis. Incompletely assessed cervical spondylosis. Sinuses/Orbits: Bilateral lens replacements. Trace bilateral ethmoid sinus mucosal thickening. Bilateral middle ear/mastoid effusions. IMPRESSION: No evidence of acute intracranial abnormality, including acute infarction. 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 Background moderate cerebral white matter chronic small vessel ischemic disease. Moderate cerebral and cerebellar atrophy, advanced for age. Brainstem atrophy is also questioned (particularly of the midbrain). Age-indeterminate C4 vertebral compression deformity. Clinical correlation is recommended. Bilateral middle ear/mastoid effusions. Electronically Signed   By: Kellie Simmering DO   On: 01/23/2021 17:26   MR PELVIS W WO CONTRAST  Result Date: 01/01/2021 CLINICAL DATA:  Chronic sacral decubitus ulcer EXAM: MRI PELVIS WITHOUT AND WITH CONTRAST TECHNIQUE: Multiplanar multisequence MR imaging of the pelvis was performed both before and after administration of intravenous contrast. CONTRAST:  59m GADAVIST GADOBUTROL 1 MMOL/ML IV SOLN COMPARISON:  CT 12/07/2020 FINDINGS: Sacral decubitus ulcer overlies the mid to distal aspect of the sacrum. Thin fluid and air collection contiguous with the ulcer base extend into the dorsal cortex of the sacrum. Bone marrow edema and confluent low T1 signal is present within the distal sacrum extending from S4 to the sacrococcygeal junction (series 8, images 20-24). Bone marrow edema extends from the S3 segment through the distal coccyx (series 4, images 19-21). Bilateral SI joints are intact. No SI joint effusion or erosive changes to suggest septic  arthritis. No fracture or pelvic diastasis. There is susceptibility artifact related to right total hip arthroplasty hardware. No appreciable periprosthetic fluid collection within the limitations of the exam.  Diffuse intramuscular edema throughout the visualized musculature of the pelvis compatible with a nonspecific myositis. No intramuscular fluid collection. Diffuse anasarca. Transplant kidney is seen within the low pelvis with parenchymal enhancement on postcontrast sequences. Foley catheter is present within the bladder lumen. IMPRESSION: 1. Sacral decubitus ulcer with acute osteomyelitis of the distal sacrum extending from S4 to the sacrococcygeal junction. 2. Bone marrow edema extends from the S3 segment through the distal coccyx, the peripheral extent of which may represent reactive osteitis versus early acute osteomyelitis. 3. Diffuse intramuscular edema throughout the visualized musculature of the pelvis compatible with a nonspecific myositis. No intramuscular fluid collection. 4. Diffuse anasarca. These results will be called to the ordering clinician or representative by the Radiologist Assistant, and communication documented in the PACS or [employer]. Electronically Signed   By: Davina Poke D.O.   On: 01/01/2021 08:23   US RENAL  Result Date: 01/21/2021 CLINICAL DATA:  Acute kidney injury.  Prior renal transplant. EXAM: RENAL / URINARY TRACT ULTRASOUND COMPLETE COMPARISON:  Renal ultrasound 10/29/2020 and 09/27/2020 FINDINGS: Right Kidney: Not visualized sonographically. Left Kidney: Not visualized sonographically. Bladder: Foley catheter is present in the urinary bladder. The urinary bladder appears distended and there are echoes within the urinary bladder compatible with debris. Other: Incidental gallstones noted. IMPRESSION: 1. The native kidneys are not well seen sonographically. If specific evaluation of the transplant kidney is desired, consider dedicated renal transplant  ultrasound potentially with Doppler assessment. 2. Distended urinary bladder despite the presence of a Foley catheter. There is some debris within the urinary bladder. 3. Cholelithiasis. Electronically Signed   By: Van Clines M.D.   On: 01/21/2021 09:08   DG Chest Port 1 View  Result Date: 01/18/2021 CLINICAL DATA:  60 year old female with sepsis. EXAM: PORTABLE CHEST 1 VIEW COMPARISON:  Chest radiograph dated 12/25/2020. FINDINGS: Right-sided PICC with tip at the cavoatrial junction. No focal consolidation, pleural effusion or pneumothorax. Borderline cardiomegaly. No acute osseous pathology. Osteopenia. IMPRESSION: No acute cardiopulmonary process. Electronically Signed   By: Anner Crete M.D.   On: 01/18/2021 20:57   DG CHEST PORT 1 VIEW  Result Date: 12/25/2020 CLINICAL DATA:  Infected sacral decubitus ulcer. EXAM: PORTABLE CHEST 1 VIEW COMPARISON:  12/07/2020 FINDINGS: Poor inspiration with increased patchy and linear density at both lung bases, left greater than right. The remainder of the lungs are clear with normal vascularity. Normal sized heart. Left axillary surgical clips. Moderate left and mild right glenohumeral joint degenerative changes. IMPRESSION: Poor inspiration with bibasilar atelectasis and possible pneumonia, left greater than right. Electronically Signed   By: Claudie Revering M.D.   On: 12/25/2020 13:22   DG Knee Complete 4 Views Left  Result Date: 01/12/2021 CLINICAL DATA:  Pain following fall EXAM: LEFT KNEE - COMPLETE 4+ VIEW COMPARISON:  None. FINDINGS: Frontal, lateral, and bilateral oblique views were obtained. There is no fracture, dislocation, or joint effusion. There is joint space narrowing medially and in the patellofemoral joint regions. No erosive change. There are foci of arterial vascular calcification at multiple sites. IMPRESSION: Narrowing medially in the patellofemoral joint regions. No fracture, dislocation, or joint effusion. Multilevel  atherosclerotic arterial vascular calcification. Electronically Signed   By: Lowella Grip III M.D.   On: 01/12/2021 15:07   DG Hips Bilat W or Wo Pelvis 3-4 Views  Result Date: 01/12/2021 CLINICAL DATA:  Pain following fall EXAM: DG HIP (WITH OR WITHOUT PELVIS) 3-4V BILAT COMPARISON:  None. FINDINGS: Frontal pelvis as well as frontal and lateral views  of each hip joint obtained. Bones are diffusely osteoporotic. There is a total hip replacement on the right with prosthetic components well-seated. No acute fracture or dislocation. There is moderate narrowing of the left hip joint. There is multifocal arterial vascular calcification. There are clips in the pelvis. IMPRESSION: Diffuse osteoporosis. Total hip replacement on the right with prosthetic components appearing well-seated. No acute fracture or dislocation. Moderate narrowing left hip joint. Postoperative changes in pelvis. Extensive arterial vascular calcification consistent with atherosclerosis noted. Electronically Signed   By: Lowella Grip III M.D.   On: 01/12/2021 15:10   [place] EKG SITE RITE  Result Date: 01/01/2021 If Site Rite image not attached, placement could not be confirmed due to current cardiac rhythm.    Microbiology: Recent Results (from the past 240 hour(s))  Blood culture (routine single)     Status: None   Collection Time: 01/18/21  7:58 PM   Specimen: Left Antecubital; Blood  Result Value Ref Range Status   Specimen Description LEFT ANTECUBITAL  Final   Special Requests   Final    BOTTLES DRAWN AEROBIC AND ANAEROBIC Blood Culture adequate volume   Culture   Final    NO GROWTH 5 DAYS Performed at [facility], [address] [place], [place] [address]    Report Status 01/23/2021 FINAL  Final  Urine culture     Status: Abnormal   Collection Time: 01/18/21  8:45 PM   Specimen: In/Out Cath Urine  Result Value Ref Range Status   Specimen Description   Final    IN/OUT CATH URINE Performed at [facility], [address] [place], [place] [address]    Special Requests   Final    NONE Performed at [facility], [address], [place], [place] [address]    Culture >=100,000 COLONIES/mL YEAST (A)  Final   Report Status 01/20/2021 FINAL  Final  SARS Coronavirus 2 by RT PCR (hospital order, performed in [place] hospital lab) Nasopharyngeal Nasopharyngeal Swab     Status: None   Collection Time: 01/18/21  9:26 PM   Specimen: Nasopharyngeal Swab  Result Value Ref Range Status   SARS Coronavirus 2 NEGATIVE NEGATIVE Final    Comment: (NOTE) SARS-CoV-2 target nucleic acids are NOT DETECTED.  The SARS-CoV-2 RNA is generally detectable in upper and lower respiratory specimens during the acute phase of infection. The lowest concentration of SARS-CoV-2 viral copies this assay can detect is 250 copies / mL. A negative result does not preclude SARS-CoV-2 infection and should not be used as the sole basis for treatment or other patient management decisions.  A negative result may occur with improper specimen collection / handling, submission of specimen other than nasopharyngeal swab, presence of viral mutation(s) within the areas targeted by this assay, and inadequate number of viral copies (<250 copies / mL). A negative result must be combined with clinical observations, patient history, and epidemiological information.  Fact Sheet for Patients:   StrictlyIdeas.no  Fact Sheet for Healthcare Providers: BankingDealers.co.za  This test is not yet approved or  cleared by the [place] FDA and has been authorized for detection and/or diagnosis of SARS-CoV-2 by FDA under an Emergency Use Authorization (EUA).  This EUA will remain in effect (meaning this test can be used) for the duration of the COVID-19 declaration under Section 564(b)(1) of the Act, 21 U.S.C. section 360bbb-3(b)(1), unless the authorization is terminated or revoked  sooner.  Performed at [facility], [address], [place], [place] [address]   Culture,  blood (single)     Status: None   Collection Time: 01/18/21 11:18 PM   Specimen: Left Antecubital; Blood  Result Value Ref Range Status   Specimen Description LEFT ANTECUBITAL  Final   Special Requests   Final    BOTTLES DRAWN AEROBIC AND ANAEROBIC Blood Culture adequate volume   Culture   Final    NO GROWTH 5 DAYS Performed at [facility], [address], [place], [place] [address]    Report Status 01/23/2021 FINAL  Final     Labs: Basic Metabolic Panel: Recent Labs  Lab 01/19/21 0651 01/20/21 0619 01/21/21 0025 01/21/21 0842 01/22/21 0411 01/23/21 0348  NA 133* 134* 134*  --  133* 131*  K 4.9 4.7 4.4  --  4.6 5.0  CL 101 101 102  --  100 99  CO2 18* 21* 24  --  26 25  GLUCOSE 275* 355* 268*  --  263* 364*  BUN 50* 52* 49*  --  48* 55*  CREATININE 1.22* 1.45* 1.32* 1.25* 1.34* 1.18*  CALCIUM 10.0 9.6 9.4  --  9.4 9.3  PHOS  --   --  3.4  --   --   --    Liver Function Tests: Recent Labs  Lab 01/18/21 1921 01/19/21 0651 01/20/21 0619 01/21/21 0025  AST 18 16 13*  --   ALT '11 10 8  ' --   ALKPHOS 120 114 86  --   BILITOT 0.7 1.5* 1.0  --   PROT 5.7* 5.2* 5.1*  --   ALBUMIN 2.3* 2.0* 2.3* 3.0*   No results for input(s): LIPASE, AMYLASE in the last 168 hours. No results for input(s): AMMONIA in the last 168 hours. CBC: Recent Labs  Lab 01/20/21 0619 01/21/21 0025 01/21/21 0842 01/22/21 0411 01/23/21 0348 01/24/21 0658  WBC 8.4 7.8 7.1 7.5 7.0 8.6  NEUTROABS 5.7  --  4.5 4.5 4.0 4.6  HGB 7.4* 6.9* 9.1* 9.8* 9.8* 9.5*  HCT 23.7* 21.5* 28.6* 30.5* 30.5* 29.1*  MCV 115.0* 112.6* 107.1* 106.3* 107.0* 106.6*  PLT 253 224 212 235 206 207   Cardiac Enzymes: No results for input(s): CKTOTAL, CKMB, CKMBINDEX, TROPONINI in the last 168 hours. BNP: Invalid input(s): POCBNP CBG: Recent Labs  Lab 01/23/21 0754 01/23/21 1119 01/23/21 1554 01/23/21 2158 01/24/21 0739   GLUCAP 389* 389* 293* 83 89    Time coordinating discharge:  36 minutes  Signed:  Orson Eva, DO [facility] Pager: [phone] 01/24/2021, 10:38 AM

## 2021-01-24 NOTE — Progress Notes (Addendum)
Report called to [facility] at 1050 am, EMS on unit to transport patient, PICC line flushed and patient dressing change performed

## 2021-01-26 LAB — GLUCOSE, CAPILLARY: Glucose-Capillary: 178 mg/dL — ABNORMAL HIGH (ref 70–99)

## 2021-01-27 ENCOUNTER — Encounter: Payer: Self-pay | Admitting: Diagnostic Neuroimaging

## 2021-01-27 ENCOUNTER — Ambulatory Visit: Payer: Medicare Other | Admitting: Diagnostic Neuroimaging

## 2021-01-27 ENCOUNTER — Telehealth: Payer: Self-pay | Admitting: *Deleted

## 2021-01-27 NOTE — Telephone Encounter (Signed)
Patient was no show for new patient appointment today. 

## 2021-01-30 ENCOUNTER — Ambulatory Visit: Payer: BC Managed Care – PPO | Admitting: Physician Assistant

## 2021-02-11 DIAGNOSIS — L89154 Pressure ulcer of sacral region, stage 4: Secondary | ICD-10-CM | POA: Diagnosis not present

## 2021-02-11 DIAGNOSIS — Z940 Kidney transplant status: Secondary | ICD-10-CM | POA: Diagnosis not present

## 2021-02-11 DIAGNOSIS — Z515 Encounter for palliative care: Secondary | ICD-10-CM | POA: Diagnosis not present

## 2021-02-11 DIAGNOSIS — J189 Pneumonia, unspecified organism: Secondary | ICD-10-CM | POA: Diagnosis not present

## 2021-02-11 DIAGNOSIS — K219 Gastro-esophageal reflux disease without esophagitis: Secondary | ICD-10-CM | POA: Diagnosis not present

## 2021-02-11 DIAGNOSIS — F32A Depression, unspecified: Secondary | ICD-10-CM | POA: Diagnosis not present

## 2021-02-11 DIAGNOSIS — Z681 Body mass index (BMI) 19 or less, adult: Secondary | ICD-10-CM | POA: Diagnosis not present

## 2021-02-11 DIAGNOSIS — Z9483 Pancreas transplant status: Secondary | ICD-10-CM | POA: Diagnosis not present

## 2021-02-11 DIAGNOSIS — E872 Acidosis: Secondary | ICD-10-CM | POA: Diagnosis not present

## 2021-02-11 DIAGNOSIS — J159 Unspecified bacterial pneumonia: Secondary | ICD-10-CM | POA: Diagnosis not present

## 2021-02-11 DIAGNOSIS — D849 Immunodeficiency, unspecified: Secondary | ICD-10-CM | POA: Diagnosis not present

## 2021-02-11 DIAGNOSIS — Z8616 Personal history of COVID-19: Secondary | ICD-10-CM | POA: Diagnosis not present

## 2021-02-11 DIAGNOSIS — Z89511 Acquired absence of right leg below knee: Secondary | ICD-10-CM | POA: Diagnosis not present

## 2021-02-11 DIAGNOSIS — E785 Hyperlipidemia, unspecified: Secondary | ICD-10-CM | POA: Diagnosis not present

## 2021-02-11 DIAGNOSIS — N183 Chronic kidney disease, stage 3 unspecified: Secondary | ICD-10-CM | POA: Diagnosis not present

## 2021-02-11 DIAGNOSIS — R6520 Severe sepsis without septic shock: Secondary | ICD-10-CM | POA: Diagnosis not present

## 2021-02-11 DIAGNOSIS — E039 Hypothyroidism, unspecified: Secondary | ICD-10-CM | POA: Diagnosis not present

## 2021-02-11 DIAGNOSIS — Z48815 Encounter for surgical aftercare following surgery on the digestive system: Secondary | ICD-10-CM | POA: Diagnosis not present

## 2021-02-11 DIAGNOSIS — A4189 Other specified sepsis: Secondary | ICD-10-CM | POA: Diagnosis not present

## 2021-02-11 DIAGNOSIS — Z931 Gastrostomy status: Secondary | ICD-10-CM | POA: Diagnosis not present

## 2021-02-11 DIAGNOSIS — U071 COVID-19: Secondary | ICD-10-CM | POA: Diagnosis not present

## 2021-02-11 DIAGNOSIS — G9341 Metabolic encephalopathy: Secondary | ICD-10-CM | POA: Diagnosis not present

## 2021-02-11 DIAGNOSIS — B3749 Other urogenital candidiasis: Secondary | ICD-10-CM | POA: Diagnosis not present

## 2021-02-11 DIAGNOSIS — M4628 Osteomyelitis of vertebra, sacral and sacrococcygeal region: Secondary | ICD-10-CM | POA: Diagnosis not present

## 2021-02-11 DIAGNOSIS — E1022 Type 1 diabetes mellitus with diabetic chronic kidney disease: Secondary | ICD-10-CM | POA: Diagnosis not present

## 2021-02-11 DIAGNOSIS — N3000 Acute cystitis without hematuria: Secondary | ICD-10-CM | POA: Diagnosis not present

## 2021-02-11 DIAGNOSIS — E871 Hypo-osmolality and hyponatremia: Secondary | ICD-10-CM | POA: Diagnosis not present

## 2021-02-11 DIAGNOSIS — E43 Unspecified severe protein-calorie malnutrition: Secondary | ICD-10-CM | POA: Diagnosis not present

## 2021-02-11 DIAGNOSIS — G9340 Encephalopathy, unspecified: Secondary | ICD-10-CM | POA: Diagnosis not present

## 2021-02-11 DIAGNOSIS — A419 Sepsis, unspecified organism: Secondary | ICD-10-CM | POA: Diagnosis not present

## 2021-02-11 DIAGNOSIS — R918 Other nonspecific abnormal finding of lung field: Secondary | ICD-10-CM | POA: Diagnosis not present

## 2021-02-11 DIAGNOSIS — I10 Essential (primary) hypertension: Secondary | ICD-10-CM | POA: Diagnosis not present

## 2021-02-14 DIAGNOSIS — Z89511 Acquired absence of right leg below knee: Secondary | ICD-10-CM | POA: Diagnosis not present

## 2021-02-14 DIAGNOSIS — A419 Sepsis, unspecified organism: Secondary | ICD-10-CM | POA: Diagnosis not present

## 2021-02-14 DIAGNOSIS — Z681 Body mass index (BMI) 19 or less, adult: Secondary | ICD-10-CM | POA: Diagnosis not present

## 2021-02-14 DIAGNOSIS — N390 Urinary tract infection, site not specified: Secondary | ICD-10-CM | POA: Diagnosis not present

## 2021-02-14 DIAGNOSIS — L89154 Pressure ulcer of sacral region, stage 4: Secondary | ICD-10-CM | POA: Diagnosis not present

## 2021-02-14 DIAGNOSIS — U071 COVID-19: Secondary | ICD-10-CM | POA: Diagnosis not present

## 2021-02-14 DIAGNOSIS — Z9483 Pancreas transplant status: Secondary | ICD-10-CM | POA: Diagnosis not present

## 2021-02-14 DIAGNOSIS — Z940 Kidney transplant status: Secondary | ICD-10-CM | POA: Diagnosis not present

## 2021-02-14 DIAGNOSIS — U099 Post covid-19 condition, unspecified: Secondary | ICD-10-CM | POA: Diagnosis not present

## 2021-02-14 DIAGNOSIS — E1065 Type 1 diabetes mellitus with hyperglycemia: Secondary | ICD-10-CM | POA: Diagnosis not present

## 2021-02-14 DIAGNOSIS — R6520 Severe sepsis without septic shock: Secondary | ICD-10-CM | POA: Diagnosis not present

## 2021-02-14 DIAGNOSIS — I10 Essential (primary) hypertension: Secondary | ICD-10-CM | POA: Diagnosis not present

## 2021-02-14 DIAGNOSIS — A4189 Other specified sepsis: Secondary | ICD-10-CM | POA: Diagnosis not present

## 2021-02-14 DIAGNOSIS — J159 Unspecified bacterial pneumonia: Secondary | ICD-10-CM | POA: Diagnosis not present

## 2021-02-14 DIAGNOSIS — E11649 Type 2 diabetes mellitus with hypoglycemia without coma: Secondary | ICD-10-CM | POA: Diagnosis not present

## 2021-02-14 DIAGNOSIS — E108 Type 1 diabetes mellitus with unspecified complications: Secondary | ICD-10-CM | POA: Diagnosis not present

## 2021-02-14 DIAGNOSIS — G9340 Encephalopathy, unspecified: Secondary | ICD-10-CM | POA: Diagnosis not present

## 2021-02-14 DIAGNOSIS — R918 Other nonspecific abnormal finding of lung field: Secondary | ICD-10-CM | POA: Diagnosis not present

## 2021-02-14 DIAGNOSIS — G9341 Metabolic encephalopathy: Secondary | ICD-10-CM | POA: Diagnosis not present

## 2021-02-14 DIAGNOSIS — J22 Unspecified acute lower respiratory infection: Secondary | ICD-10-CM | POA: Diagnosis not present

## 2021-02-14 DIAGNOSIS — E162 Hypoglycemia, unspecified: Secondary | ICD-10-CM | POA: Diagnosis not present

## 2021-02-14 DIAGNOSIS — E10649 Type 1 diabetes mellitus with hypoglycemia without coma: Secondary | ICD-10-CM | POA: Diagnosis not present

## 2021-02-14 DIAGNOSIS — Z515 Encounter for palliative care: Secondary | ICD-10-CM | POA: Diagnosis not present

## 2021-02-14 DIAGNOSIS — D849 Immunodeficiency, unspecified: Secondary | ICD-10-CM | POA: Diagnosis not present

## 2021-02-14 DIAGNOSIS — E43 Unspecified severe protein-calorie malnutrition: Secondary | ICD-10-CM | POA: Diagnosis not present

## 2021-02-14 DIAGNOSIS — E871 Hypo-osmolality and hyponatremia: Secondary | ICD-10-CM | POA: Diagnosis not present

## 2021-02-14 DIAGNOSIS — D649 Anemia, unspecified: Secondary | ICD-10-CM | POA: Diagnosis not present

## 2021-02-14 DIAGNOSIS — E872 Acidosis: Secondary | ICD-10-CM | POA: Diagnosis not present

## 2021-02-14 DIAGNOSIS — R4182 Altered mental status, unspecified: Secondary | ICD-10-CM | POA: Diagnosis not present

## 2021-02-14 DIAGNOSIS — J90 Pleural effusion, not elsewhere classified: Secondary | ICD-10-CM | POA: Diagnosis not present

## 2021-02-14 DIAGNOSIS — E785 Hyperlipidemia, unspecified: Secondary | ICD-10-CM | POA: Diagnosis not present

## 2021-02-14 DIAGNOSIS — K219 Gastro-esophageal reflux disease without esophagitis: Secondary | ICD-10-CM | POA: Diagnosis not present

## 2021-02-14 DIAGNOSIS — N3000 Acute cystitis without hematuria: Secondary | ICD-10-CM | POA: Diagnosis not present

## 2021-02-14 DIAGNOSIS — J189 Pneumonia, unspecified organism: Secondary | ICD-10-CM | POA: Diagnosis not present

## 2021-02-24 ENCOUNTER — Telehealth: Payer: Self-pay

## 2021-02-24 NOTE — Telephone Encounter (Signed)
Attempted to contact patient's husband [name] to schedule a Palliative Care consult appointment. No answer left a message to return call.

## 2021-02-25 ENCOUNTER — Telehealth: Payer: Self-pay

## 2021-02-25 ENCOUNTER — Encounter (HOSPITAL_COMMUNITY): Payer: Self-pay

## 2021-02-25 ENCOUNTER — Emergency Department (HOSPITAL_COMMUNITY): Payer: BC Managed Care – PPO

## 2021-02-25 ENCOUNTER — Other Ambulatory Visit: Payer: Self-pay

## 2021-02-25 ENCOUNTER — Inpatient Hospital Stay (HOSPITAL_COMMUNITY)
Admission: EM | Admit: 2021-02-25 | Discharge: 2021-03-03 | DRG: 592 | Disposition: A | Discharge status: Home Health | Payer: BC Managed Care – PPO | Attending: Internal Medicine | Admitting: Internal Medicine

## 2021-02-25 DIAGNOSIS — Z8616 Personal history of COVID-19: Secondary | ICD-10-CM | POA: Diagnosis not present

## 2021-02-25 DIAGNOSIS — R109 Unspecified abdominal pain: Secondary | ICD-10-CM | POA: Diagnosis not present

## 2021-02-25 DIAGNOSIS — N3000 Acute cystitis without hematuria: Secondary | ICD-10-CM | POA: Diagnosis not present

## 2021-02-25 DIAGNOSIS — Z89511 Acquired absence of right leg below knee: Secondary | ICD-10-CM

## 2021-02-25 DIAGNOSIS — I1310 Hypertensive heart and chronic kidney disease without heart failure, with stage 1 through stage 4 chronic kidney disease, or unspecified chronic kidney disease: Secondary | ICD-10-CM | POA: Diagnosis present

## 2021-02-25 DIAGNOSIS — B965 Pseudomonas (aeruginosa) (mallei) (pseudomallei) as the cause of diseases classified elsewhere: Secondary | ICD-10-CM | POA: Diagnosis present

## 2021-02-25 DIAGNOSIS — U071 COVID-19: Secondary | ICD-10-CM | POA: Diagnosis not present

## 2021-02-25 DIAGNOSIS — L89622 Pressure ulcer of left heel, stage 2: Secondary | ICD-10-CM | POA: Diagnosis present

## 2021-02-25 DIAGNOSIS — R404 Transient alteration of awareness: Secondary | ICD-10-CM | POA: Diagnosis not present

## 2021-02-25 DIAGNOSIS — Z79890 Hormone replacement therapy (postmenopausal): Secondary | ICD-10-CM | POA: Diagnosis not present

## 2021-02-25 DIAGNOSIS — R509 Fever, unspecified: Secondary | ICD-10-CM | POA: Diagnosis not present

## 2021-02-25 DIAGNOSIS — R0902 Hypoxemia: Secondary | ICD-10-CM | POA: Diagnosis not present

## 2021-02-25 DIAGNOSIS — Z9483 Pancreas transplant status: Secondary | ICD-10-CM | POA: Diagnosis not present

## 2021-02-25 DIAGNOSIS — R000 Tachycardia, unspecified: Secondary | ICD-10-CM | POA: Diagnosis not present

## 2021-02-25 DIAGNOSIS — Z794 Long term (current) use of insulin: Secondary | ICD-10-CM | POA: Diagnosis not present

## 2021-02-25 DIAGNOSIS — E785 Hyperlipidemia, unspecified: Secondary | ICD-10-CM | POA: Diagnosis present

## 2021-02-25 DIAGNOSIS — L89154 Pressure ulcer of sacral region, stage 4: Secondary | ICD-10-CM | POA: Diagnosis not present

## 2021-02-25 DIAGNOSIS — Z681 Body mass index (BMI) 19 or less, adult: Secondary | ICD-10-CM

## 2021-02-25 DIAGNOSIS — D649 Anemia, unspecified: Secondary | ICD-10-CM | POA: Diagnosis not present

## 2021-02-25 DIAGNOSIS — I959 Hypotension, unspecified: Secondary | ICD-10-CM

## 2021-02-25 DIAGNOSIS — R402421 Glasgow coma scale score 9-12, in the field [EMT or ambulance]: Secondary | ICD-10-CM | POA: Diagnosis not present

## 2021-02-25 DIAGNOSIS — K810 Acute cholecystitis: Secondary | ICD-10-CM | POA: Diagnosis not present

## 2021-02-25 DIAGNOSIS — R531 Weakness: Secondary | ICD-10-CM | POA: Diagnosis not present

## 2021-02-25 DIAGNOSIS — R339 Retention of urine, unspecified: Secondary | ICD-10-CM | POA: Diagnosis present

## 2021-02-25 DIAGNOSIS — R279 Unspecified lack of coordination: Secondary | ICD-10-CM | POA: Diagnosis not present

## 2021-02-25 DIAGNOSIS — K2210 Ulcer of esophagus without bleeding: Secondary | ICD-10-CM

## 2021-02-25 DIAGNOSIS — Z7952 Long term (current) use of systemic steroids: Secondary | ICD-10-CM

## 2021-02-25 DIAGNOSIS — N390 Urinary tract infection, site not specified: Secondary | ICD-10-CM | POA: Diagnosis present

## 2021-02-25 DIAGNOSIS — E1069 Type 1 diabetes mellitus with other specified complication: Secondary | ICD-10-CM | POA: Diagnosis not present

## 2021-02-25 DIAGNOSIS — R627 Adult failure to thrive: Secondary | ICD-10-CM | POA: Diagnosis not present

## 2021-02-25 DIAGNOSIS — E871 Hypo-osmolality and hyponatremia: Secondary | ICD-10-CM | POA: Diagnosis not present

## 2021-02-25 DIAGNOSIS — N1831 Chronic kidney disease, stage 3a: Secondary | ICD-10-CM | POA: Diagnosis present

## 2021-02-25 DIAGNOSIS — G928 Other toxic encephalopathy: Secondary | ICD-10-CM | POA: Diagnosis not present

## 2021-02-25 DIAGNOSIS — Z940 Kidney transplant status: Secondary | ICD-10-CM | POA: Diagnosis not present

## 2021-02-25 DIAGNOSIS — E109 Type 1 diabetes mellitus without complications: Secondary | ICD-10-CM | POA: Diagnosis present

## 2021-02-25 DIAGNOSIS — G9341 Metabolic encephalopathy: Secondary | ICD-10-CM | POA: Diagnosis not present

## 2021-02-25 DIAGNOSIS — E8809 Other disorders of plasma-protein metabolism, not elsewhere classified: Secondary | ICD-10-CM | POA: Diagnosis present

## 2021-02-25 DIAGNOSIS — I9589 Other hypotension: Secondary | ICD-10-CM | POA: Diagnosis present

## 2021-02-25 DIAGNOSIS — J181 Lobar pneumonia, unspecified organism: Secondary | ICD-10-CM | POA: Diagnosis not present

## 2021-02-25 DIAGNOSIS — Z79899 Other long term (current) drug therapy: Secondary | ICD-10-CM | POA: Diagnosis not present

## 2021-02-25 DIAGNOSIS — D631 Anemia in chronic kidney disease: Secondary | ICD-10-CM | POA: Diagnosis present

## 2021-02-25 DIAGNOSIS — Z743 Need for continuous supervision: Secondary | ICD-10-CM | POA: Diagnosis not present

## 2021-02-25 DIAGNOSIS — E43 Unspecified severe protein-calorie malnutrition: Secondary | ICD-10-CM | POA: Diagnosis not present

## 2021-02-25 DIAGNOSIS — E1022 Type 1 diabetes mellitus with diabetic chronic kidney disease: Secondary | ICD-10-CM | POA: Diagnosis present

## 2021-02-25 DIAGNOSIS — H9191 Unspecified hearing loss, right ear: Secondary | ICD-10-CM | POA: Diagnosis present

## 2021-02-25 DIAGNOSIS — R4182 Altered mental status, unspecified: Secondary | ICD-10-CM

## 2021-02-25 DIAGNOSIS — Z66 Do not resuscitate: Secondary | ICD-10-CM | POA: Diagnosis present

## 2021-02-25 DIAGNOSIS — E039 Hypothyroidism, unspecified: Secondary | ICD-10-CM | POA: Diagnosis present

## 2021-02-25 DIAGNOSIS — Z431 Encounter for attention to gastrostomy: Secondary | ICD-10-CM

## 2021-02-25 DIAGNOSIS — K828 Other specified diseases of gallbladder: Secondary | ICD-10-CM | POA: Diagnosis present

## 2021-02-25 DIAGNOSIS — K8020 Calculus of gallbladder without cholecystitis without obstruction: Secondary | ICD-10-CM | POA: Diagnosis not present

## 2021-02-25 DIAGNOSIS — Z7401 Bed confinement status: Secondary | ICD-10-CM

## 2021-02-25 DIAGNOSIS — R5381 Other malaise: Secondary | ICD-10-CM | POA: Diagnosis not present

## 2021-02-25 DIAGNOSIS — Z931 Gastrostomy status: Secondary | ICD-10-CM

## 2021-02-25 DIAGNOSIS — A4189 Other specified sepsis: Secondary | ICD-10-CM | POA: Diagnosis not present

## 2021-02-25 DIAGNOSIS — J159 Unspecified bacterial pneumonia: Secondary | ICD-10-CM | POA: Diagnosis not present

## 2021-02-25 LAB — CBC WITH DIFFERENTIAL/PLATELET
Abs Immature Granulocytes: 0.05 10*3/uL (ref 0.00–0.07)
Basophils Absolute: 0 10*3/uL (ref 0.0–0.1)
Basophils Relative: 0 %
Eosinophils Absolute: 0.1 10*3/uL (ref 0.0–0.5)
Eosinophils Relative: 1 %
HCT: 21.3 % — ABNORMAL LOW (ref 36.0–46.0)
Hemoglobin: 6.9 g/dL — CL (ref 12.0–15.0)
Immature Granulocytes: 1 %
Lymphocytes Relative: 26 %
Lymphs Abs: 2.1 10*3/uL (ref 0.7–4.0)
MCH: 33.5 pg (ref 26.0–34.0)
MCHC: 32.4 g/dL (ref 30.0–36.0)
MCV: 103.4 fL — ABNORMAL HIGH (ref 80.0–100.0)
Monocytes Absolute: 1.1 10*3/uL — ABNORMAL HIGH (ref 0.1–1.0)
Monocytes Relative: 13 %
Neutro Abs: 4.6 10*3/uL (ref 1.7–7.7)
Neutrophils Relative %: 59 %
Platelets: 403 10*3/uL — ABNORMAL HIGH (ref 150–400)
RBC: 2.06 MIL/uL — ABNORMAL LOW (ref 3.87–5.11)
RDW: 19.8 % — ABNORMAL HIGH (ref 11.5–15.5)
WBC: 7.9 10*3/uL (ref 4.0–10.5)
nRBC: 0 % (ref 0.0–0.2)

## 2021-02-25 LAB — URINALYSIS, ROUTINE W REFLEX MICROSCOPIC
Bilirubin Urine: NEGATIVE
Glucose, UA: NEGATIVE mg/dL
Ketones, ur: NEGATIVE mg/dL
Nitrite: NEGATIVE
Protein, ur: 100 mg/dL — AB
RBC / HPF: >50 RBC/hpf — ABNORMAL HIGH (ref 0–5)
Specific Gravity, Urine: 1.011 (ref 1.005–1.030)
WBC, UA: >50 WBC/hpf — ABNORMAL HIGH (ref 0–5)
pH: 6 (ref 5.0–8.0)

## 2021-02-25 LAB — PREPARE RBC (CROSSMATCH)

## 2021-02-25 LAB — COMPREHENSIVE METABOLIC PANEL
ALT: 12 U/L (ref 0–44)
AST: 16 U/L (ref 15–41)
Albumin: 2.1 g/dL — ABNORMAL LOW (ref 3.5–5.0)
Alkaline Phosphatase: 106 U/L (ref 38–126)
Anion gap: 9 (ref 5–15)
BUN: 36 mg/dL — ABNORMAL HIGH (ref 8–23)
CO2: 22 mmol/L (ref 22–32)
Calcium: 9.3 mg/dL (ref 8.9–10.3)
Chloride: 104 mmol/L (ref 98–111)
Creatinine, Ser: 0.66 mg/dL (ref 0.44–1.00)
GFR, Estimated: >60 mL/min (ref >60)
Glucose, Bld: 183 mg/dL — ABNORMAL HIGH (ref 70–99)
Potassium: 5.1 mmol/L (ref 3.5–5.1)
Sodium: 135 mmol/L (ref 135–145)
Total Bilirubin: 0.6 mg/dL (ref 0.3–1.2)
Total Protein: 5.6 g/dL — ABNORMAL LOW (ref 6.5–8.1)

## 2021-02-25 LAB — POC OCCULT BLOOD, ED: Fecal Occult Bld: NEGATIVE

## 2021-02-25 LAB — LACTIC ACID, PLASMA
Lactic Acid, Venous: 0.6 mmol/L (ref 0.5–1.9)
Lactic Acid, Venous: 1 mmol/L (ref 0.5–1.9)

## 2021-02-25 MED ORDER — DOCUSATE SODIUM 100 MG PO CAPS: 100.0000 mg | ORAL_CAPSULE | Freq: Two times a day (BID) | ORAL | Status: DC | PRN

## 2021-02-25 MED ORDER — CYCLOSPORINE 25 MG PO CAPS
75.0000 mg | ORAL_CAPSULE | Freq: Two times a day (BID) | ORAL | Status: DC
  Administered 2021-02-26 – 2021-03-03 (×12): 75 mg via ORAL
  Filled 2021-02-25 (×12): qty 3

## 2021-02-25 MED ORDER — CHOLESTYRAMINE 4 G PO PACK
4.0000 g | PACK | Freq: Two times a day (BID) | ORAL | Status: DC
Start: 2021-02-26 — End: 2021-02-26
  Filled 2021-02-25: qty 1

## 2021-02-25 MED ORDER — VANCOMYCIN HCL IN DEXTROSE 1-5 GM/200ML-% IV SOLN
1000.0000 mg | INTRAVENOUS | Status: DC
  Administered 2021-02-26: 1000 mg via INTRAVENOUS
  Filled 2021-02-25: qty 200

## 2021-02-25 MED ORDER — PIPERACILLIN-TAZOBACTAM 3.375 G IVPB 30 MIN: 4.5000 g | Freq: Once | INTRAVENOUS | Status: DC

## 2021-02-25 MED ORDER — SODIUM CHLORIDE 0.9 % IV BOLUS
1000.0000 mL | Freq: Once | INTRAVENOUS | Status: AC
  Administered 2021-02-25: 1000 mL via INTRAVENOUS

## 2021-02-25 MED ORDER — ACETAMINOPHEN 325 MG PO TABS
650.0000 mg | ORAL_TABLET | Freq: Four times a day (QID) | ORAL | Status: DC | PRN
  Administered 2021-02-26: 650 mg via ORAL
  Filled 2021-02-25: qty 2

## 2021-02-25 MED ORDER — IOHEXOL 300 MG/ML  SOLN
100.0000 mL | Freq: Once | INTRAMUSCULAR | Status: AC | PRN
  Administered 2021-02-25: 100 mL via INTRAVENOUS

## 2021-02-25 MED ORDER — SODIUM CHLORIDE 0.9 % IV BOLUS
1500.0000 mL | Freq: Once | INTRAVENOUS | Status: AC
  Administered 2021-02-25: 1500 mL via INTRAVENOUS

## 2021-02-25 MED ORDER — AZATHIOPRINE 50 MG PO TABS
50.0000 mg | ORAL_TABLET | Freq: Every day | ORAL | Status: DC
  Administered 2021-02-26 – 2021-03-03 (×6): 50 mg via ORAL
  Filled 2021-02-25 (×6): qty 1

## 2021-02-25 MED ORDER — PIPERACILLIN-TAZOBACTAM 3.375 G IVPB
3.3750 g | Freq: Three times a day (TID) | INTRAVENOUS | Status: DC
  Administered 2021-02-26 – 2021-03-01 (×10): 3.375 g via INTRAVENOUS
  Filled 2021-02-25 (×10): qty 50

## 2021-02-25 MED ORDER — VANCOMYCIN HCL 1250 MG/250ML IV SOLN
1250.0000 mg | Freq: Once | INTRAVENOUS | Status: AC
  Administered 2021-02-25: 1250 mg via INTRAVENOUS
  Filled 2021-02-25: qty 250

## 2021-02-25 MED ORDER — ENOXAPARIN SODIUM 40 MG/0.4ML ~~LOC~~ SOLN: 40.0000 mg | SUBCUTANEOUS | Status: DC

## 2021-02-25 MED ORDER — PIPERACILLIN-TAZOBACTAM 3.375 G IVPB 30 MIN
3.3750 g | INTRAVENOUS | Status: AC
  Administered 2021-02-25: 3.375 g via INTRAVENOUS
  Filled 2021-02-25: qty 50

## 2021-02-25 MED ORDER — HYDROCORTISONE NA SUCCINATE PF 100 MG IJ SOLR
50.0000 mg | Freq: Four times a day (QID) | INTRAMUSCULAR | Status: DC
  Administered 2021-02-26 – 2021-02-27 (×7): 50 mg via INTRAVENOUS
  Filled 2021-02-25 (×7): qty 2

## 2021-02-25 MED ORDER — ONDANSETRON HCL 4 MG PO TABS: 4.0000 mg | ORAL_TABLET | Freq: Four times a day (QID) | ORAL | Status: DC | PRN

## 2021-02-25 MED ORDER — SODIUM CHLORIDE 0.9% IV SOLUTION: Freq: Once | INTRAVENOUS | Status: AC

## 2021-02-25 MED ORDER — ATORVASTATIN CALCIUM 10 MG PO TABS
10.0000 mg | ORAL_TABLET | Freq: Every day | ORAL | Status: DC
Start: 2021-02-26 — End: 2021-03-03
  Administered 2021-02-26 – 2021-03-03 (×6): 10 mg via ORAL
  Filled 2021-02-25 (×6): qty 1

## 2021-02-25 MED ORDER — COLLAGENASE 250 UNIT/GM EX OINT
TOPICAL_OINTMENT | Freq: Every day | CUTANEOUS | Status: DC
  Filled 2021-02-25: qty 30

## 2021-02-25 MED ORDER — ONDANSETRON HCL 4 MG/2ML IJ SOLN: 4.0000 mg | Freq: Four times a day (QID) | INTRAMUSCULAR | Status: DC | PRN

## 2021-02-25 MED ORDER — ACETAMINOPHEN 650 MG RE SUPP: 650.0000 mg | Freq: Four times a day (QID) | RECTAL | Status: DC | PRN

## 2021-02-25 MED ORDER — ASPIRIN EC 81 MG PO TBEC
81.0000 mg | DELAYED_RELEASE_TABLET | Freq: Every day | ORAL | Status: DC
  Administered 2021-02-26 – 2021-03-03 (×6): 81 mg via ORAL
  Filled 2021-02-25 (×6): qty 1

## 2021-02-25 MED ORDER — B COMPLEX-C-FOLIC ACID PO TABS: 1.0000 | ORAL_TABLET | Freq: Every day | ORAL | Status: DC

## 2021-02-25 NOTE — Telephone Encounter (Signed)
Spoke with patient's husband [name] and scheduled an in-person Palliative Consult for 03/11/21 @ 9AM  COVID screening was negative. One dog in the home will put away before NP arrives. Patient lives with husband.  Consent obtained; updated Outlook/Netsmart/Team List and Epic.  Family is aware they may be receiving a call from NP the day before or day of to confirm appointment.

## 2021-02-25 NOTE — ED Triage Notes (Signed)
EMS reports discharged from [facility] today with referral to Palliative care. Husband called for AMS. Pt has stage 4 sacral ulcer. Husband states Pt declining over last week.   BP 150/70 HR 122 RR 30 Sp02 87 RA  CBG 158  20ga L upper arm 51m NS enroute

## 2021-02-25 NOTE — H&P (Addendum)
History and Physical    [name] [id number] DOB: Dec 24, 1961 DOA: 02/25/2021  PCP: [name], MD  Patient coming from: Home  I have personally briefly reviewed patient's old medical records in [place]  Chief Complaint: AMS  HPI: [name] is a 60 y.o. female with medical history significant of DM1, adult failure to thrive, HTN, R BKA, renal transplant.  Patient essentially bedbound for a number of months now.  Developed Stage 4 sacral decubitus with osteomyelitis.  Ultimately sent to [facility] after admit in Jan.  Was "doing well at [facility]" per husband, until [facility] decided to discharge her to SNF this past week.  Spent 3 days in SNF.  Began to have AMS.  Pt admitted to [facility] for the past couple of days.  Discharged yesterday home with paliative care.  Husband didn't understand what Pal care was, and indicates he wants nothing to do with Pal care, wants full court press medically.  AMS is now severe, at baseline she can communicate verbally, but not for the past 4-5 days.  Pt now in to ED at [facility].   ED Course: UA demonstrates UTI.  CT abd/pelvis: enlarged gallbladder, ? Acute cholecystitis.  LFTs nl.  WBC nl.  Pt did have hypotension down to 85 systolic while in ED.  Tm 99 HR 102.  Pt is immunosuppressed on prednisone, imuran, and cyclosporine (for h/o kidney transplant).  HGB 6.9.  Husband denies any known melena, BRBPR, recently, etc, though she did just spend last few days in hospital.  Unfortunately I am unable to see records from [facility] system for some reason.  Says "Query Denied, Patient is no longer participating in [place] [facility]" when I try and check in Care everywhere.   Review of Systems: Pt unable to complete ROS due to AMS.  Past Medical History:  Diagnosis Date  . Anemia of chronic renal failure   . DDD (degenerative disc disease), lumbar   . Diabetes mellitus without  complication ([place])    type 1  . Gait disorder    neurogenic  . Hypertension   . Hypothyroid 12/08/2020  . Neuropathy   . Renal disease   . Sacral decubitus ulcer, stage IV ([place]) 12/08/2020    Past Surgical History:  Procedure Laterality Date  . AMPUTATION Right 10/01/2020   Procedure: RIGHT BELOW KNEE AMPUTATION;  Surgeon: Newt Minion, MD;  Location: [facility];  Service: Orthopedics;  Laterality: Right;  . BLADDER SURGERY    . ESOPHAGOGASTRODUODENOSCOPY (EGD) WITH PROPOFOL N/A 11/10/2020   Procedure: ESOPHAGOGASTRODUODENOSCOPY (EGD) WITH PROPOFOL;  Surgeon: Irene Shipper, MD;  Location: [facility];  Service: Endoscopy;  Laterality: N/A;  . KIDNEY TRANSPLANT    . LEG SURGERY     tib-fib fx , ORIF     reports that she has never smoked. She has never used smokeless tobacco. She reports that she does not drink alcohol and does not use drugs.  No Known Allergies  Family History  Problem Relation Age of Onset  . Hypertension Mother   . Hypertension Father      Prior to Admission medications   Medication Sig Start Date End Date Taking? Authorizing Provider  cycloSPORINE (SANDIMMUNE) 25 MG capsule Take 75 mg by mouth 2 (two) times daily.   Yes [provider]  lisinopril (ZESTRIL) 10 MG tablet Take 10 mg by mouth daily.   Yes [provider]  acetaminophen (TYLENOL) 325 MG tablet Take 2 tablets (650  mg total) by mouth every 6 (six) hours as needed for mild pain (or Fever >/= 101). Patient taking differently: Take 650 mg by mouth every 6 (six) hours as needed for mild pain or fever. 10/06/20   Nita Sells, MD  atorvastatin (LIPITOR) 20 MG tablet Take 20 mg by mouth daily. 11/25/20   [provider]  azaTHIOprine (IMURAN) 50 MG tablet Take 50 mg by mouth daily. 08/23/20   [provider]  collagenase (SANTYL) ointment Apply topically daily. 11/08/20   Swayze, Ava, DO  feeding supplement (ENSURE ENLIVE / ENSURE PLUS) LIQD Take 237 mLs by  mouth 2 (two) times daily between meals. 12/23/20   Hosie Poisson, MD  folic acid (FOLVITE) 1 MG tablet Take 1 tablet (1 mg total) by mouth daily. 12/24/20   Hosie Poisson, MD  HYDROcodone-acetaminophen (NORCO/VICODIN) 5-325 MG tablet Take 1 tablet by mouth every 4 (four) hours as needed for moderate pain. 01/24/21   Orson Eva, MD  insulin glargine (LANTUS) 100 UNIT/ML injection Inject 0.1 mLs (10 Units total) into the skin daily. 01/24/21   Orson Eva, MD  insulin lispro (HUMALOG) 100 UNIT/ML injection Inject 2 Units into the skin 3 (three) times daily before meals. Sliding scale if 200-250=2 units;251-300=4units;301-350=6 units;351-400=8 units;104-450=10 units;451-500=12 units subcutaneously before meals and at bedtime for DM    [provider]  levothyroxine (SYNTHROID) 50 MCG tablet Take 1 tablet (50 mcg total) by mouth daily. 12/23/20   Hosie Poisson, MD  liver oil-zinc oxide (DESITIN) 40 % ointment Apply topically daily. 12/23/20   Hosie Poisson, MD  metoprolol tartrate (LOPRESSOR) 25 MG tablet Take 12.5 mg by mouth 2 (two) times daily.  09/15/20   [provider]  Multiple Vitamin (MULTIVITAMIN WITH MINERALS) TABS tablet Take 1 tablet by mouth daily. 12/24/20   Hosie Poisson, MD  nutrition supplement, JUVEN, (JUVEN) PACK Take 1 packet by mouth 2 (two) times daily between meals. 12/23/20   Hosie Poisson, MD  omeprazole (PRILOSEC) 20 MG capsule Take 20 mg by mouth daily.    [provider]  pantoprazole (PROTONIX) 40 MG tablet Take 1 tablet (40 mg total) by mouth 2 (two) times daily. 11/12/20   Barb Merino, MD  polyethylene glycol (MIRALAX / GLYCOLAX) 17 g packet Take 17 g by mouth daily. 12/24/20   Hosie Poisson, MD  predniSONE (DELTASONE) 5 MG tablet Take 5 mg by mouth daily. 12/15/20   [provider]  rosuvastatin (CRESTOR) 5 MG tablet Take 1 tablet (5 mg total) by mouth at bedtime. 11/07/20   Swayze, Ava, DO  senna-docusate (SENOKOT-S) 8.6-50 MG tablet  Take 2 tablets by mouth 2 (two) times daily as needed for mild constipation. 12/23/20   Hosie Poisson, MD  sertraline (ZOLOFT) 25 MG tablet Take 1 tablet (25 mg total) by mouth daily. 10/06/20   Nita Sells, MD  sodium bicarbonate 650 MG tablet Take 1 tablet (650 mg total) by mouth 2 (two) times daily. 10/06/20   Nita Sells, MD  vitamin B-12 100 MCG tablet Take 1 tablet (100 mcg total) by mouth daily. 12/24/20   Hosie Poisson, MD    Physical Exam: Vitals:   02/25/21 1841 02/25/21 2146  BP: 125/67 (!) 103/46  Pulse: (!) 118 (!) 102  Resp: (!) 22 20  Temp: 99 F (37.2 C)   TempSrc: Oral   SpO2: 100% 100%    Constitutional: Ill appearing. Eyes: PERRL, lids and conjunctivae normal ENMT: Mucous membranes are moist. Posterior pharynx clear of any exudate or lesions.Normal dentition.  Neck: normal, supple, no masses, no thyromegaly Respiratory: clear to auscultation bilaterally, no wheezing, no crackles. Normal respiratory effort. No accessory muscle use.  Cardiovascular: Regular rate and rhythm, no murmurs / rubs / gallops. No extremity edema. 2+ pedal pulses. No carotid bruits.  Abdomen: no tenderness, no masses palpated. No hepatosplenomegaly. Bowel sounds positive.  Musculoskeletal: no clubbing / cyanosis. No joint deformity upper and lower extremities. Good ROM, no contractures. Normal muscle tone.  Skin: no rashes, lesions, ulcers. No induration Neurologic: Lethargic, opens eyes spontaneously, doesn't follow commands, nonverbal. Psychiatric: Lethargic.   Labs on Admission: I have personally reviewed following labs and imaging studies  CBC: Recent Labs  Lab 02/25/21 2000  WBC 7.9  NEUTROABS 4.6  HGB 6.9*  HCT 21.3*  MCV 103.4*  PLT [id number]*   Basic Metabolic Panel: Recent Labs  Lab 02/25/21 2000  NA 135  K 5.1  CL 104  CO2 22  GLUCOSE 183*  BUN 36*  CREATININE 0.66  CALCIUM 9.3   GFR: CrCl cannot be calculated (Unknown ideal weight.). Liver  Function Tests: Recent Labs  Lab 02/25/21 2000  AST 16  ALT 12  ALKPHOS 106  BILITOT 0.6  PROT 5.6*  ALBUMIN 2.1*   No results for input(s): LIPASE, AMYLASE in the last 168 hours. No results for input(s): AMMONIA in the last 168 hours. Coagulation Profile: No results for input(s): INR, PROTIME in the last 168 hours. Cardiac Enzymes: No results for input(s): CKTOTAL, CKMB, CKMBINDEX, TROPONINI in the last 168 hours. BNP (last 3 results) No results for input(s): PROBNP in the last 8760 hours. HbA1C: No results for input(s): HGBA1C in the last 72 hours. CBG: No results for input(s): GLUCAP in the last 168 hours. Lipid Profile: No results for input(s): CHOL, HDL, LDLCALC, TRIG, CHOLHDL, LDLDIRECT in the last 72 hours. Thyroid Function Tests: No results for input(s): TSH, T4TOTAL, FREET4, T3FREE, THYROIDAB in the last 72 hours. Anemia Panel: No results for input(s): VITAMINB12, FOLATE, FERRITIN, TIBC, IRON, RETICCTPCT in the last 72 hours. Urine analysis:    Component Value Date/Time   COLORURINE AMBER (A) 02/25/2021 1940   APPEARANCEUR TURBID (A) 02/25/2021 1940   LABSPEC 1.011 02/25/2021 1940   PHURINE 6.0 02/25/2021 1940   GLUCOSEU NEGATIVE 02/25/2021 1940   HGBUR MODERATE (A) 02/25/2021 1940   BILIRUBINUR NEGATIVE 02/25/2021 [place] NEGATIVE 02/25/2021 1940   PROTEINUR 100 (A) 02/25/2021 1940   NITRITE NEGATIVE 02/25/2021 1940   LEUKOCYTESUR LARGE (A) 02/25/2021 1940    Radiological Exams on Admission: DG Chest 1 View  Result Date: 02/25/2021 CLINICAL DATA:  Stage IV decubitus sacral ulcer, diabetes, altered level of consciousness EXAM: CHEST  1 VIEW COMPARISON:  01/18/2021 FINDINGS: Single frontal view of the chest demonstrates a stable cardiac silhouette. There is streaky consolidation at the left lung base which may reflect atelectasis or airspace disease. No effusion or pneumothorax. No acute bony abnormalities. Prior healed right rib fractures. IMPRESSION:  1. Streaky left basilar consolidation consistent with atelectasis or airspace disease. Electronically Signed   By: Randa Ngo M.D.   On: 02/25/2021 20:04   CT Head Wo Contrast  Result Date: 02/25/2021 CLINICAL DATA:  Mental status change EXAM: CT HEAD WITHOUT CONTRAST TECHNIQUE: Contiguous axial images were obtained from the base of the skull through the vertex without intravenous contrast. COMPARISON:  MRI 01/23/2021, CT 01/18/2021 FINDINGS: Brain: No acute territorial infarction, hemorrhage or intracranial mass. Moderate atrophy. Moderate hypodensity in the white matter consistent with chronic small vessel ischemic change. Stable  ventricle size. Vascular: No hyperdense vessels. Vertebral and carotid vascular calcification Skull: Normal. Negative for fracture or focal lesion. Sinuses/Orbits: Bilateral mastoid effusions. Other: None IMPRESSION: 1. No CT evidence for acute intracranial abnormality. 2. Atrophy and chronic small vessel ischemic changes of the white matter. 3. Bilateral mastoid effusion Electronically Signed   By: Donavan Foil M.D.   On: 02/25/2021 22:26   CT Abdomen Pelvis W Contrast  Addendum Date: 02/25/2021   ADDENDUM REPORT: 02/25/2021 22:47 ADDENDUM: Slightly dilated extrahepatic common bile duct, recommend correlation with LFTs with follow-up MRCP as indicated Electronically Signed   By: Donavan Foil M.D.   On: 02/25/2021 22:47   Result Date: 02/25/2021 CLINICAL DATA:  Abdominal pain sacral ulcer EXAM: CT ABDOMEN AND PELVIS WITH CONTRAST TECHNIQUE: Multidetector CT imaging of the abdomen and pelvis was performed using the standard protocol following bolus administration of intravenous contrast. CONTRAST:  11m OMNIPAQUE IOHEXOL 300 MG/ML  SOLN COMPARISON:  CT 12/07/2020, ultrasound 01/21/2021, 10/29/2020 FINDINGS: Lower chest: Lung bases demonstrate partial consolidations within the bilateral lower lobes. Cardiomegaly with coronary vascular calcification. Circumferential distal  esophageal thickening. Hepatobiliary: No focal hepatic abnormality. Markedly distended gallbladder containing stones. Extrahepatic common bile duct slightly enlarged at 8 mm at the head of pancreas. Pancreas: Mild pancreatic ductal dilatation. No inflammatory changes. Spleen: Normal in size without focal abnormality. Adrenals/Urinary Tract: Adrenal glands are normal. Atrophic native kidneys without hydronephrosis. Transplanted kidney within the pelvis slightly to the left of midline. Cyst in the lower pole of the transplanted kidney. No hydronephrosis. Urinary bladder obscured by artifact from right hip hardware. There is a Foley catheter in the bladder. Bladder appears thick walled. Stomach/Bowel: Gastrostomy tube within the body of the stomach. No dilated small bowel. No acute bowel wall thickening Vascular/Lymphatic: Extensive aortic atherosclerosis. 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 No suspicious nodes. Reproductive: No obvious adnexal mass Other: No free air. There is free fluid in the pelvis. There is generalized anasarca. Musculoskeletal: Deep sacral decubitus ulcer without drainable abscess or definitive osseous destructive change. IMPRESSION: 1. Deep sacral decubitus ulcer without drainable soft tissue abscess or definitive osseous destructive change of the distal sacrum or coccyx. 2. Markedly distended gallbladder containing stones. Ultrasound correlation if concern for acute gallbladder disease. 3. Partial consolidations within the bilateral lower lobes, atelectasis versus pneumonia. 4. Atrophic native kidneys. Transplanted kidney within the pelvis slightly to the left of midline without definitive hydronephrosis. Possible thick-walled appearance of the urinary bladder, question cystitis. 5. Small amount of free fluid in the pelvis. Generalized anasarca. 6. Circumferential distal esophageal thickening, could be secondary to reflux or esophagitis. Aortic Atherosclerosis  (ICD10-I70.0). Electronically Signed: By: KDonavan FoilM.D. On: 02/25/2021 22:40    EKG: Independently reviewed.  Assessment/Plan Principal Problem:   Acute metabolic encephalopathy Active Problems:   S/p cadaver renal transplant   Sacral decubitus ulcer, stage IV (HCC)   Type 1 diabetes mellitus with hyperlipidemia (HCC)   Unspecified severe protein-calorie malnutrition (HCC)   Acute lower UTI   Anemia    1. Acute metabolic encephalopathy - 1. Delirium, treating and working up other acute conditions as below. 2. Nutrition consult for feeding tube. 2. UTI - 1. Empiric zosyn / vanc for infection given multiple possible active sources. 2. UCx pending 3. BCx pending 3. Stage 4 sacral decubitus - 1. With prior osteo 2. Zosyn / vanc empirically 3. Wound care consult 4. Nutrition consult 4. Enlarged Gallbladder - 1. On CT scan, findings suspicious for acute cholecystitis  2. LFTs nl 3. Empiric zosyn / vanc for the moment 4. RUQ [place] pending 5. Repeat CMP in AM 5. Hypotension, chronic steroid use - 1. Will put pt on empiric stress dose steroids at this point 2. Solucortef 50 Q6H 6. S/P Renal transplant 1. Continue immunosuppressives 7. Anemia - 1. Hemoccult neg 2. 2u PRBC transfusion ordered by EDP 3. Repeat CBC in AM 8. DM1 - 1. Q4H sensitive SSI  DVT prophylaxis: Lovenox Code Status: Full Family Communication: Husband at bedside Disposition Plan: TBD Consults called: None - husband does not want to see pal care at this time. Admission status: Admit to inpatient  Severity of Illness: The appropriate patient status for this patient is INPATIENT. Inpatient status is judged to be reasonable and necessary in order to provide the required intensity of service to ensure the patient's safety. The patient's presenting symptoms, physical exam findings, and initial radiographic and laboratory data in the context of their chronic comorbidities is felt to place them at high risk  for further clinical deterioration. Furthermore, it is not anticipated that the patient will be medically stable for discharge from the hospital within 2 midnights of admission. The following factors support the patient status of inpatient.   IP status due to AMS, lethargy, not awake following commands, or taking POs.   * I certify that at the point of admission it is my clinical judgment that the patient will require inpatient hospital care spanning beyond 2 midnights from the point of admission due to high intensity of service, high risk for further deterioration and high frequency of surveillance required.*    Ellora Varnum M. DO [facility]  How to contact the [facility] Attending or Consulting provider [place] or covering provider during after hours [place], for this patient?  1. Check the care team in [facility] and look for a) attending/consulting TR[facility] provider listed and b) the [facility] team listed 2. Log into [email or web address]  Amion Physician Scheduling and messaging for groups and whole hospitals  On call and physician scheduling software for group practices, residents, hospitalists and other medical providers for call, clinic, rotation and shift schedules. On[employer] is a hospital-wide system for scheduling doctors and paging doctors on call. EasyPlot is for scientific plotting and data analysis.  [email or web address]  and use [place]'s universal password to access. If you do not have the password, please contact the hospital operator.  3. Locate the [facility] provider you are looking for under [facility] and page to a number that you can be directly reached. 4. If you still have difficulty reaching the provider, please page the [facility] (Director on Call) for the Hospitalists listed on amion for assistance.  02/25/2021, 11:28 PM

## 2021-02-25 NOTE — Progress Notes (Signed)
Pharmacy Antibiotic Note  [name] is a 60 y.o. female admitted on 02/25/2021 with osteomyelitis/sepsis.  PMH significant for s/p kidney transplant, BKA, bedbound, stage IV decubitus ulcer with osteomyelitis.  Recent hospitalization.  Zosyn 3.375gm IV and Vancomycin '1250mg'$  IV x 1 dose each to be given in the ED.  Upon admission, Pharmacy has been consulted for Vancomycin and Zosyn dosing.  Plan:  Zosyn 3.375g IV q8h (4 hour infusion).   Vancomycin 1000 mg IV Q 24 hrs. Goal AUC 400-550. Expected AUC: 547; SCr used: 0.8 (rounded up from 0.66)  Daily SCr while on both Zosyn and Vancomycin  F/U culture results and sensitivities  Height = 63" Weight = 49 kg (01/18/21) CrCl ~ 57 ml/min     Temp (24hrs), Avg:99 F (37.2 C), Min:99 F (37.2 C), Max:99 F (37.2 C)  Recent Labs  Lab 02/25/21 2000  WBC 7.9  CREATININE 0.66  LATICACIDVEN 0.6    CrCl cannot be calculated (Unknown ideal weight.).    No Known Allergies  Antimicrobials this admission: 3/2 Vanc >>   3/2 Zosyn >>    Dose adjustments this admission:    Microbiology results:  3/2 BCx:   3/2 UCx:     Thank you for allowing pharmacy to be a part of this patient's care.  Everette Rank, PharmD 02/25/2021 10:05 PM

## 2021-02-25 NOTE — ED Provider Notes (Signed)
[facility] DEPT Provider Note   CSN: [id number] Arrival date & time: 02/25/21  1827     History Chief Complaint  Patient presents with  . Altered Mental Status    [name] is a 60 y.o. female.  History obtained from the patient's husband.  Unable to obtain history from patient herself but she appears very confused and nonverbal.  Per husband the patient has been having declining mental status over the course of the last week.  She has an extensive history of kidney failure status post transplant, BKA, nonmobile bedbound, stage IV decubitus ulcer with osteomyelitis.  She was recently admitted to the hospital for worsening mental status and sepsis, discharged to nursing home for the past week.  Husband finally pulled her out yesterday due to worsening mental status and brought her to the ER here today.  He states that at baseline she could communicate verbally but has not been talking for the past 4 to 5 days.        Past Medical History:  Diagnosis Date  . Anemia of chronic renal failure   . DDD (degenerative disc disease), lumbar   . Diabetes mellitus without complication ([place])    type 1  . Gait disorder    neurogenic  . Hypertension   . Hypothyroid 12/08/2020  . Neuropathy   . Renal disease   . Sacral decubitus ulcer, stage IV ([place]) 12/08/2020    Patient Active Problem List   Diagnosis Date Noted  . Goals of care, counseling/discussion   . Palliative care by specialist   . SIRS (systemic inflammatory response syndrome) ([place]) 01/18/2021  . Malnutrition of moderate degree 01/07/2021  . Acute osteomyelitis of sacrum ([place])   . Failure to thrive in adult   . Unspecified severe protein-calorie malnutrition ([place]) 12/10/2020  . Acute metabolic encephalopathy 99991111  . Sacral decubitus ulcer, stage IV ([place]) 12/08/2020  . Cellulitis 12/08/2020  . Hypotension 12/08/2020  . Malodorous urine 12/08/2020  . Erosive esophagitis 12/08/2020   . Hypothyroid 12/08/2020  . Depression 12/08/2020  . Type 1 diabetes mellitus with hyperlipidemia ([place]) 12/08/2020  . Sepsis ([place]) 12/07/2020  . Hypoalbuminemia 12/07/2020  . Hyponatremia 12/07/2020  . CAP (community acquired pneumonia) 12/07/2020  . Acute urinary retention 12/07/2020  . Coffee ground emesis   . Gastroesophageal reflux disease with esophagitis and hemorrhage   . Long-term use of immunosuppressant medication   . GI bleed 11/08/2020  . AKI (acute kidney injury) ([place]) 10/29/2020  . Gangrene of right foot ([place])   . Subacute osteomyelitis of right foot ([place])   . Type 1 diabetes mellitus with complication, with long term current use of insulin pump ([place]) 09/27/2020  . Pressure ulcer of BKA stump ([place]) 09/27/2020  . Diabetic ketoacidosis ([place]) 09/26/2020  . Acute kidney injury superimposed on CKD ([place]) 09/26/2020  . Cellulitis and abscess of right leg 09/26/2020  . 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 S/p cadaver renal transplant 05/14/2020  . Chronic kidney disease (CKD) stage G3a/A1, moderately decreased glomerular filtration rate (GFR) between 45-59 mL/min/1.73 square meter and albuminuria creatinine ratio less than 30 mg/g (H[facility]) 05/14/2020  . Hypertension 05/14/2020    Past Surgical History:  Procedure Laterality Date  . AMPUTATION Right 10/01/2020   Procedure: RIGHT BELOW KNEE AMPUTATION;  Surgeon: Newt Minion, MD;  Location: [facility];  Service: Orthopedics;  Laterality: Right;  . BLADDER SURGERY    . ESOPHAGOGASTRODUODENOSCOPY (EGD) WITH PROPOFOL N/A 11/10/2020   Procedure: ESOPHAGOGASTRODUODENOSCOPY (EGD) WITH  PROPOFOL;  Surgeon: Irene Shipper, MD;  Location: [facility];  Service: Endoscopy;  Laterality: N/A;  . KIDNEY TRANSPLANT    . LEG SURGERY     tib-fib fx , ORIF     OB History   No obstetric history on file.     Family History  Problem Relation Age of Onset  . Hypertension Mother   . Hypertension Father     Social History    Tobacco Use  . Smoking status: Never Smoker  . Smokeless tobacco: Never Used  Vaping Use  . Vaping Use: Never used  Substance Use Topics  . Alcohol use: Never  . Drug use: Never    Home Medications Prior to Admission medications   Medication Sig Start Date End Date Taking? Authorizing Provider  acetaminophen (TYLENOL) 325 MG tablet Take 2 tablets (650 mg total) by mouth every 6 (six) hours as needed for mild pain (or Fever >/= 101). Patient taking differently: Take 650 mg by mouth every 6 (six) hours as needed for mild pain or fever. 10/06/20   Nita Sells, MD  atorvastatin (LIPITOR) 20 MG tablet Take 20 mg by mouth daily. 11/25/20   [provider]  azaTHIOprine (IMURAN) 50 MG tablet Take 50 mg by mouth daily. 08/23/20   [provider]  collagenase (SANTYL) ointment Apply topically daily. 11/08/20   Swayze, Ava, DO  cycloSPORINE (SANDIMMUNE) 25 MG capsule Take 75 mg by mouth 2 (two) times daily.    [provider]  feeding supplement (ENSURE ENLIVE / ENSURE PLUS) LIQD Take 237 mLs by mouth 2 (two) times daily between meals. 12/23/20   Hosie Poisson, MD  folic acid (FOLVITE) 1 MG tablet Take 1 tablet (1 mg total) by mouth daily. 12/24/20   Hosie Poisson, MD  HYDROcodone-acetaminophen (NORCO/VICODIN) 5-325 MG tablet Take 1 tablet by mouth every 4 (four) hours as needed for moderate pain. 01/24/21   Orson Eva, MD  insulin glargine (LANTUS) 100 UNIT/ML injection Inject 0.1 mLs (10 Units total) into the skin daily. 01/24/21   Orson Eva, MD  insulin lispro (HUMALOG) 100 UNIT/ML injection Inject 2 Units into the skin 3 (three) times daily before meals. Sliding scale if 200-250=2 units;251-300=4units;301-350=6 units;351-400=8 units;104-450=10 units;451-500=12 units subcutaneously before meals and at bedtime for DM    [provider]  levothyroxine (SYNTHROID) 50 MCG tablet Take 1 tablet (50 mcg total) by mouth daily. 12/23/20   Hosie Poisson, MD   liver oil-zinc oxide (DESITIN) 40 % ointment Apply topically daily. 12/23/20   Hosie Poisson, MD  metoprolol tartrate (LOPRESSOR) 25 MG tablet Take 12.5 mg by mouth 2 (two) times daily.  09/15/20   [provider]  Multiple Vitamin (MULTIVITAMIN WITH MINERALS) TABS tablet Take 1 tablet by mouth daily. 12/24/20   Hosie Poisson, MD  nutrition supplement, JUVEN, (JUVEN) PACK Take 1 packet by mouth 2 (two) times daily between meals. 12/23/20   Hosie Poisson, MD  omeprazole (PRILOSEC) 20 MG capsule Take 20 mg by mouth daily.    [provider]  pantoprazole (PROTONIX) 40 MG tablet Take 1 tablet (40 mg total) by mouth 2 (two) times daily. 11/12/20   Barb Merino, MD  polyethylene glycol (MIRALAX / GLYCOLAX) 17 g packet Take 17 g by mouth daily. 12/24/20   Hosie Poisson, MD  predniSONE (DELTASONE) 5 MG tablet Take 5 mg by mouth daily. 12/15/20   [provider]  rosuvastatin (CRESTOR) 5 MG tablet Take 1 tablet (5 mg total) by mouth at bedtime. 11/07/20  Swayze, Ava, DO  senna-docusate (SENOKOT-S) 8.6-50 MG tablet Take 2 tablets by mouth 2 (two) times daily as needed for mild constipation. 12/23/20   Hosie Poisson, MD  sertraline (ZOLOFT) 25 MG tablet Take 1 tablet (25 mg total) by mouth daily. 10/06/20   Nita Sells, MD  sodium bicarbonate 650 MG tablet Take 1 tablet (650 mg total) by mouth 2 (two) times daily. 10/06/20   Nita Sells, MD  vitamin B-12 100 MCG tablet Take 1 tablet (100 mcg total) by mouth daily. 12/24/20   Hosie Poisson, MD    Allergies    Patient has no known allergies.  Review of Systems   Review of Systems  Unable to perform ROS: Mental status change    Physical Exam Updated Vital Signs BP (!) 103/46   Pulse (!) 102   Temp 99 F (37.2 C) (Oral)   Resp 20   SpO2 100%   Physical Exam Constitutional:      Appearance: Normal appearance. She is ill-appearing.  HENT:     Head: Normocephalic.     Nose: Nose normal.  Eyes:      Extraocular Movements: Extraocular movements intact.  Cardiovascular:     Rate and Rhythm: Normal rate.  Pulmonary:     Effort: Pulmonary effort is normal.  Genitourinary:    Comments: Normal brown appearing stool.  Sent for guaiac test. Musculoskeletal:        General: Normal range of motion.     Cervical back: Normal range of motion.  Skin:    General: Skin is warm.  Neurological:     Mental Status: She is alert.     Comments: Patient opens her eyes spontaneously.  Does not follow any verbal commands such as lift up your hand or squeeze my hand.  She appears nonverbal and does not answer any of my questions.  She otherwise appears awake and is able to make eye contact with me.     ED Results / Procedures / Treatments   Labs (all labs ordered are listed, but only abnormal results are displayed) Labs Reviewed  COMPREHENSIVE METABOLIC PANEL - Abnormal; Notable for the following components:      Result Value   Glucose, Bld 183 (*)    BUN 36 (*)    Total Protein 5.6 (*)    Albumin 2.1 (*)    All other components within normal limits  CBC WITH DIFFERENTIAL/PLATELET - Abnormal; Notable for the following components:   RBC 2.06 (*)    Hemoglobin 6.9 (*)    HCT 21.3 (*)    MCV 103.4 (*)    RDW 19.8 (*)    Platelets 403 (*)    Monocytes Absolute 1.1 (*)    All other components within normal limits  URINALYSIS, ROUTINE W REFLEX MICROSCOPIC - Abnormal; Notable for the following components:   Color, Urine AMBER (*)    APPearance TURBID (*)    Hgb urine dipstick MODERATE (*)    Protein, ur 100 (*)    Leukocytes,Ua LARGE (*)    RBC / HPF >50 (*)    WBC, UA >50 (*)    Bacteria, UA FEW (*)    Non Squamous Epithelial 0-5 (*)    All other components within normal limits  CULTURE, BLOOD (ROUTINE X 2)  CULTURE, BLOOD (ROUTINE X 2)  URINE CULTURE  RESP PANEL BY RT-PCR (FLU A&B, COVID) ARPGX2  LACTIC ACID, PLASMA  LACTIC ACID, PLASMA  POC OCCULT BLOOD, ED  POC OCCULT BLOOD, ED  TYPE AND SCREEN  PREPARE RBC (CROSSMATCH)    EKG None  Radiology DG Chest 1 View  Result Date: 02/25/2021 CLINICAL DATA:  Stage IV decubitus sacral ulcer, diabetes, altered level of consciousness EXAM: CHEST  1 VIEW COMPARISON:  01/18/2021 FINDINGS: Single frontal view of the chest demonstrates a stable cardiac silhouette. There is streaky consolidation at the left lung base which may reflect atelectasis or airspace disease. No effusion or pneumothorax. No acute bony abnormalities. Prior healed right rib fractures. IMPRESSION: 1. Streaky left basilar consolidation consistent with atelectasis or airspace disease. Electronically Signed   By: Randa Ngo M.D.   On: 02/25/2021 20:04    Procedures .Critical Care Performed by: Luna Fuse, MD Authorized by: Luna Fuse, MD   Critical care provider statement:    Critical care time (minutes):  40   Critical care time was exclusive of:  Separately billable procedures and treating other patients and teaching time Comments:     Patient had a hypotensive episode, improved with IV hydration and resuscitation.     Medications Ordered in ED Medications  0.9 %  sodium chloride infusion (Manually program via Guardrails IV Fluids) (has no administration in time range)  sodium chloride 0.9 % bolus 1,500 mL (has no administration in time range)  piperacillin-tazobactam (ZOSYN) IVPB 4.5 g (has no administration in time range)  vancomycin (VANCOREADY) IVPB 1250 mg/250 mL (has no administration in time range)  sodium chloride 0.9 % bolus 1,000 mL (1,000 mLs Intravenous New Bag/Given 02/25/21 2038)  iohexol (OMNIPAQUE) 300 MG/ML solution 100 mL (100 mLs Intravenous Contrast Given 02/25/21 2142)    ED Course  I have reviewed the triage vital signs and the nursing notes.  Pertinent labs & imaging results that were available during my care of the patient were reviewed by me and considered in my medical decision making (see chart for details).     MDM Rules/Calculators/A&P                          Patient's chart indicates referrals to palliative care.  However upon discussion with the patient's husband, he states that he is not ready to make her comfort care, would like to pursue medical care for her and would like her admitted for her worsening mental status and condition.  White count normal hemoglobin low 6.8.  Risk-benefit discussed with husband at bedside requesting transfusion which was ordered.  Rectal test was normal.  Stool, guaiac was negative.  Imaging ordered as well pending result.  Given her severe anemia decompensated state and episode of hypotension, patient admitted to the hospitalist team.  Husband states he would like to pursue aggressive care for the patient.   Final Clinical Impression(s) / ED Diagnoses Final diagnoses:  Altered mental status, unspecified altered mental status type  Acute cystitis without hematuria  Hypotensive episode  Severe anemia    Rx / DC Orders ED Discharge Orders    None       Luna Fuse, MD 02/25/21 2150

## 2021-02-26 DIAGNOSIS — G9341 Metabolic encephalopathy: Secondary | ICD-10-CM

## 2021-02-26 LAB — MAGNESIUM
Magnesium: 1.9 mg/dL (ref 1.7–2.4)
Magnesium: 1.9 mg/dL (ref 1.7–2.4)

## 2021-02-26 LAB — COMPREHENSIVE METABOLIC PANEL
ALT: 12 U/L (ref 0–44)
AST: 18 U/L (ref 15–41)
Albumin: 2 g/dL — ABNORMAL LOW (ref 3.5–5.0)
Alkaline Phosphatase: 110 U/L (ref 38–126)
Anion gap: 12 (ref 5–15)
BUN: 35 mg/dL — ABNORMAL HIGH (ref 8–23)
CO2: 20 mmol/L — ABNORMAL LOW (ref 22–32)
Calcium: 9.4 mg/dL (ref 8.9–10.3)
Chloride: 108 mmol/L (ref 98–111)
Creatinine, Ser: 0.73 mg/dL (ref 0.44–1.00)
GFR, Estimated: >60 mL/min (ref >60)
Glucose, Bld: 245 mg/dL — ABNORMAL HIGH (ref 70–99)
Potassium: 4.7 mmol/L (ref 3.5–5.1)
Sodium: 140 mmol/L (ref 135–145)
Total Bilirubin: 0.9 mg/dL (ref 0.3–1.2)
Total Protein: 5.9 g/dL — ABNORMAL LOW (ref 6.5–8.1)

## 2021-02-26 LAB — CBC
HCT: 34.6 % — ABNORMAL LOW (ref 36.0–46.0)
Hemoglobin: 11.2 g/dL — ABNORMAL LOW (ref 12.0–15.0)
MCH: 31.5 pg (ref 26.0–34.0)
MCHC: 32.4 g/dL (ref 30.0–36.0)
MCV: 97.5 fL (ref 80.0–100.0)
Platelets: 361 10*3/uL (ref 150–400)
RBC: 3.55 MIL/uL — ABNORMAL LOW (ref 3.87–5.11)
RDW: 20.1 % — ABNORMAL HIGH (ref 11.5–15.5)
WBC: 5.4 10*3/uL (ref 4.0–10.5)
nRBC: 0 % (ref 0.0–0.2)

## 2021-02-26 LAB — GLUCOSE, CAPILLARY
Glucose-Capillary: 125 mg/dL — ABNORMAL HIGH (ref 70–99)
Glucose-Capillary: 141 mg/dL — ABNORMAL HIGH (ref 70–99)
Glucose-Capillary: 240 mg/dL — ABNORMAL HIGH (ref 70–99)
Glucose-Capillary: 369 mg/dL — ABNORMAL HIGH (ref 70–99)
Glucose-Capillary: 523 mg/dL (ref 70–99)

## 2021-02-26 LAB — GLUCOSE, RANDOM: Glucose, Bld: 570 mg/dL (ref 70–99)

## 2021-02-26 LAB — HEMOGLOBIN A1C
Hgb A1c MFr Bld: 7.9 % — ABNORMAL HIGH (ref 4.8–5.6)
Mean Plasma Glucose: 180.03 mg/dL

## 2021-02-26 LAB — RESP PANEL BY RT-PCR (FLU A&B, COVID) ARPGX2
Influenza A by PCR: NEGATIVE
Influenza B by PCR: NEGATIVE
SARS Coronavirus 2 by RT PCR: POSITIVE — AB

## 2021-02-26 LAB — PHOSPHORUS
Phosphorus: 4.2 mg/dL (ref 2.5–4.6)
Phosphorus: 4.3 mg/dL (ref 2.5–4.6)

## 2021-02-26 MED ORDER — CHOLESTYRAMINE LIGHT 4 G PO PACK
4.0000 g | PACK | Freq: Two times a day (BID) | ORAL | Status: DC
  Administered 2021-02-26 – 2021-03-03 (×4): 4 g via ORAL
  Filled 2021-02-26 (×11): qty 1

## 2021-02-26 MED ORDER — INSULIN ASPART 100 UNIT/ML ~~LOC~~ SOLN
10.0000 [IU] | Freq: Once | SUBCUTANEOUS | Status: AC
  Administered 2021-02-26: 10 [IU] via SUBCUTANEOUS

## 2021-02-26 MED ORDER — B COMPLEX-C PO TABS
1.0000 | ORAL_TABLET | Freq: Every day | ORAL | Status: DC
  Administered 2021-02-26 – 2021-03-03 (×6): 1 via ORAL
  Filled 2021-02-26 (×6): qty 1

## 2021-02-26 MED ORDER — PANTOPRAZOLE SODIUM 40 MG PO TBEC
40.0000 mg | DELAYED_RELEASE_TABLET | Freq: Every day | ORAL | Status: DC
Start: 2021-02-26 — End: 2021-03-03
  Administered 2021-02-26 – 2021-03-03 (×6): 40 mg via ORAL
  Filled 2021-02-26 (×6): qty 1

## 2021-02-26 MED ORDER — SERTRALINE HCL 25 MG PO TABS
25.0000 mg | ORAL_TABLET | Freq: Every day | ORAL | Status: DC
  Administered 2021-02-26 – 2021-03-03 (×6): 25 mg via ORAL
  Filled 2021-02-26 (×7): qty 1

## 2021-02-26 MED ORDER — SODIUM BICARBONATE 650 MG PO TABS
650.0000 mg | ORAL_TABLET | Freq: Two times a day (BID) | ORAL | Status: DC
  Administered 2021-02-26 – 2021-02-28 (×7): 650 mg via ORAL
  Filled 2021-02-26 (×7): qty 1

## 2021-02-26 MED ORDER — INSULIN ASPART 100 UNIT/ML ~~LOC~~ SOLN: 25.0000 [IU] | Freq: Once | SUBCUTANEOUS | Status: DC

## 2021-02-26 MED ORDER — CHLORHEXIDINE GLUCONATE CLOTH 2 % EX PADS
6.0000 | MEDICATED_PAD | Freq: Every day | CUTANEOUS | Status: DC
  Administered 2021-02-26 – 2021-03-03 (×6): 6 via TOPICAL

## 2021-02-26 MED ORDER — INSULIN ASPART 100 UNIT/ML ~~LOC~~ SOLN
0.0000 [IU] | SUBCUTANEOUS | Status: DC
  Administered 2021-02-26: 15 [IU] via SUBCUTANEOUS
  Administered 2021-02-27: 8 [IU] via SUBCUTANEOUS
  Administered 2021-02-27: 15 [IU] via SUBCUTANEOUS
  Administered 2021-02-27: 8 [IU] via SUBCUTANEOUS
  Administered 2021-02-27: 5 [IU] via SUBCUTANEOUS
  Administered 2021-02-27 – 2021-02-28 (×2): 8 [IU] via SUBCUTANEOUS
  Administered 2021-02-28: 3 [IU] via SUBCUTANEOUS
  Administered 2021-02-28: 11 [IU] via SUBCUTANEOUS
  Administered 2021-02-28: 2 [IU] via SUBCUTANEOUS
  Administered 2021-02-28: 3 [IU] via SUBCUTANEOUS
  Administered 2021-03-01: 5 [IU] via SUBCUTANEOUS
  Administered 2021-03-01: 3 [IU] via SUBCUTANEOUS
  Administered 2021-03-01 (×2): 5 [IU] via SUBCUTANEOUS
  Administered 2021-03-01: 3 [IU] via SUBCUTANEOUS
  Administered 2021-03-02: 5 [IU] via SUBCUTANEOUS
  Administered 2021-03-02 (×2): 3 [IU] via SUBCUTANEOUS
  Administered 2021-03-03: 2 [IU] via SUBCUTANEOUS

## 2021-02-26 MED ORDER — LEVOTHYROXINE SODIUM 88 MCG PO TABS
88.0000 ug | ORAL_TABLET | Freq: Every day | ORAL | Status: DC
  Administered 2021-02-27 – 2021-03-03 (×5): 88 ug via ORAL
  Filled 2021-02-26 (×6): qty 1

## 2021-02-26 MED ORDER — INSULIN ASPART 100 UNIT/ML ~~LOC~~ SOLN
0.0000 [IU] | SUBCUTANEOUS | Status: DC
  Administered 2021-02-26: 3 [IU] via SUBCUTANEOUS
  Administered 2021-02-26: 9 [IU] via SUBCUTANEOUS
  Administered 2021-02-26: 1 [IU] via SUBCUTANEOUS
  Administered 2021-02-26: 9 [IU] via SUBCUTANEOUS
  Filled 2021-02-26: qty 0.09

## 2021-02-26 MED ORDER — ENOXAPARIN SODIUM 30 MG/0.3ML ~~LOC~~ SOLN
30.0000 mg | SUBCUTANEOUS | Status: DC
  Administered 2021-02-26 – 2021-03-03 (×6): 30 mg via SUBCUTANEOUS
  Filled 2021-02-26 (×6): qty 0.3

## 2021-02-26 MED ORDER — VITAL HIGH PROTEIN PO LIQD: 1000.0000 mL | ORAL | Status: DC

## 2021-02-26 MED ORDER — FREE WATER
150.0000 mL | Status: DC
  Administered 2021-02-26 – 2021-03-03 (×21): 150 mL

## 2021-02-26 MED ORDER — GLUCERNA 1.5 CAL PO LIQD
1000.0000 mL | ORAL | Status: DC
  Administered 2021-02-26 – 2021-03-01 (×4): 1000 mL
  Filled 2021-02-26 (×7): qty 1000

## 2021-02-26 NOTE — Progress Notes (Signed)
Inpatient Diabetes Program Recommendations  AACE/ADA: New Consensus Statement on Inpatient Glycemic Control (2015)  Target Ranges:  Prepandial:   less than 140 mg/dL      Peak postprandial:   less than 180 mg/dL (1-2 hours)      Critically ill patients:  140 - 180 mg/dL   Lab Results  Component Value Date   GLUCAP 369 (H) 02/26/2021   HGBA1C 7.9 (H) 02/25/2021    Review of Glycemic Control  Diabetes history: DM1 Outpatient Diabetes medications: Lantus 10 units QD, Humalog 2-12 units TID with meals + 2 units TID with meals Current orders for Inpatient glycemic control: Novolog 0-9 units Q4H  On SoluCortef 50 mg Q6H  To start Glucerna TF at 50/H  Inpatient Diabetes Program Recommendations:     Add Lantus 3 units BID Add Novolog 2 units Q4H for TF coverage  Follow glucose trends.  Thank you. Lorenda Peck, RD, LDN, CDE Inpatient Diabetes Coordinator [phone]

## 2021-02-26 NOTE — Consult Note (Addendum)
[facility] Nurse Consult Note: Reason for Consult:Stage 4 sacral pressure injury, present on admission.  Patient was discharged from [facility] to SNF and spouse states she has declined in this time.  Currently being treated for UTI and altered mental status. Spouse intends for all medical interventions to be pursued.   Wound type:stage 4 sacral wound, chronic nonhealing. History chronic osteomyelitis.  Duration over one year.   History R BKA Left ankle deformity and has unusual placement of pressure injury to left lateral heel Pressure Injury POA: Yes Measurement: 9 cm x 8 cm x 2 cm sacrum Left heel:  1 cm x 2 cm scabbed lesion Wound bed: sacrum pale pink, slick Drainage (amount, consistency, odor) moderate serosanguinous  Musty odor Periwound:intact  Dressing procedure/placement/frequency:Cleanse sacral wound with NS and pat dry.  Line wound bed with aquacel AG ([facility] # [id number]) Fill wound depth with fluffed gauze.  Top with dry gauze and ABD pads and tape.  Change every other day.  Silicone foam to left heel wound. Change every other day.   Will not follow at this time.  Please re-consult if needed.  Domenic Moras MSN, RN, FNP-BC [facility]ON Wound, Ostomy, Continence Nurse Pager [phone]

## 2021-02-26 NOTE — Plan of Care (Signed)
  Problem: Education: Goal: Knowledge of General Education information will improve Description: Including pain rating scale, medication(s)/side effects and non-pharmacologic comfort measures Outcome: Progressing   Problem: Health Behavior/Discharge Planning: Goal: Ability to manage health-related needs will improve Outcome: Progressing   Problem: Clinical Measurements: Goal: Ability to maintain clinical measurements within normal limits will improve Outcome: Progressing Goal: Will remain free from infection Outcome: Progressing Goal: Diagnostic test results will improve Outcome: Progressing Goal: Respiratory complications will improve Outcome: Progressing Goal: Cardiovascular complication will be avoided Outcome: Progressing   Problem: Activity: Goal: Risk for activity intolerance will decrease Outcome: Progressing   Problem: Nutrition: Goal: Adequate nutrition will be maintained Outcome: Progressing   Problem: Coping: Goal: Level of anxiety will decrease Outcome: Progressing   Problem: Elimination: Goal: Will not experience complications related to bowel motility Outcome: Progressing Goal: Will not experience complications related to urinary retention Outcome: Progressing   Problem: Pain Managment: Goal: General experience of comfort will improve Outcome: Progressing   Problem: Safety: Goal: Ability to remain free from injury will improve Outcome: Progressing   Problem: Skin Integrity: Goal: Risk for impaired skin integrity will decrease Outcome: Progressing   Problem: Education: Goal: Ability to describe self-care measures that may prevent or decrease complications (Diabetes Survival Skills Education) will improve Outcome: Progressing Goal: Individualized Educational Video(s) Outcome: Progressing   Problem: Coping: Goal: Ability to adjust to condition or change in health will improve Outcome: Progressing   Problem: Fluid Volume: Goal: Ability to  maintain a balanced intake and output will improve Outcome: Progressing   Problem: Health Behavior/Discharge Planning: Goal: Ability to identify and utilize available resources and services will improve Outcome: Progressing Goal: Ability to manage health-related needs will improve Outcome: Progressing   Problem: Metabolic: Goal: Ability to maintain appropriate glucose levels will improve Outcome: Progressing   Problem: Nutritional: Goal: Maintenance of adequate nutrition will improve Outcome: Progressing Goal: Progress toward achieving an optimal weight will improve Outcome: Progressing   Problem: Skin Integrity: Goal: Risk for impaired skin integrity will decrease Outcome: Progressing   Problem: Tissue Perfusion: Goal: Adequacy of tissue perfusion will improve Outcome: Progressing   Problem: Education: Goal: Knowledge of risk factors and measures for prevention of condition will improve Outcome: Progressing   Problem: Coping: Goal: Psychosocial and spiritual needs will be supported Outcome: Progressing   Problem: Respiratory: Goal: Will maintain a patent airway Outcome: Progressing Goal: Complications related to the disease process, condition or treatment will be avoided or minimized Outcome: Progressing   

## 2021-02-26 NOTE — Progress Notes (Addendum)
PROGRESS NOTE    [name]  XFG:[id number] DOB: July 24, 1961 DOA: 02/25/2021 PCP: Doris Croak, MD   Brief Narrative: 60 yo female DM TYPE I FTT HTN R BKA RENAL TRANSPLANT on immunosuppressants.  History obtained from patient's husband and ER records.  Multiple hospital admissions in the last 6 months reviewed in the chart. 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  She was sent to [facility] where she started to have nausea vomiting and diarrhea per husband she had food poisoning and was sent to [facility] for 1 week and was sent out to [place] [facility] again for 3 weeks approximately she had an infected bedsore she was again sent back to [facility] and then discharged to [place] at [facility] for 1 week she was again admitted to [facility] for dehydration/sepsis and sacral infected decubitus with osteomyelitis PICC line placed IV antibiotics given.  Then discharged to [facility] from where she was discharged to [facility] where she was treated with antibiotics for questionable source of infection and then she was discharged to home.  Per husband he was going to take care of her at home with home health.  However patient was more in the state of unresponsiveness and he was not able to care for her. Per husband patient's baseline she was awake alert talking walking prior to amputation.  She had a PEG tube placed at [facility].  She got diagnosed with Covid in [facility] around January 30. He wants [place] to do everything possible for her to get her back to her baseline.  However he did agree to DNR/DNI. In the ED CT of the abdomen and pelvis shows possible acute cholecystitis and large gallbladder.  Normal LFTs and white count.  However her blood pressure was low at  85 in the ED.  T-max was 99 heart rate was 102.  Patient is on multiple immunosuppressants including cyclosporine Imuran and prednisone.  Assessment & Plan:   Principal Problem:   Acute metabolic encephalopathy Active Problems:   S/p cadaver renal transplant   Sacral decubitus ulcer, stage IV (HCC)   Type 1 diabetes mellitus with hyperlipidemia (HCC)   Unspecified severe protein-calorie malnutrition (HCC)   Acute lower UTI   Anemia   # 1 Acute Toxic metabolic encephalopathy-this is thought to be secondary to multifactorial etiologies including UTI UA consistent with UTI urine culture pending, severe anemia, stage IV sacral decubitus, possible acute cholecystitis .Per husband her baseline mental status is she is awake and alert and answer questions appropriately. Since her amputation of the right lower extremity in October 2021 she has been deteriorating. She has been in and out of hospitals and nursing home since then. CT of the abdomen and pelvis this admission does not reveal any evidence of osteomyelitis in the sacral region. Continue Vanco and Zosyn monitor renal functions and follow-up cultures. Patient does not respond to questions or commands.  She responds only to painful stimuli.  #2 SIRS patient met SIRS criteria on admission with hypotension systolic blood pressure less than 90 with tachycardia with a heart rate above 100.  Lactic level was normal white count was normal though she is immunosuppressed from chronic prednisone Imuran and cyclosporine.  #3 anemia of chronic disease hemoglobin 6.9 down from 9.5 in  January 2022.  No evidence of active bleeding at this time.   FOBT NEGATIVE. BASELINE HB 8 TO 9 transfusing 2 units of packed RBCs today.  Follow-up labs in a.m.  #4Stage 4 sacral decubitus and stage II left heel-POA.  Seen by wound care continue their recommendations.  #5?ACUTE cholecystitis - follow up ultrasound  Normal wbc and normal Lfts   #6 s/p renal transplant  -on imuran solumedrol and cyclosporine  #7 hypoalbumenemia dietary consulted restart tube feedings.  #8 hypothyroidsm continue Synthroid.  #9type 1 DM hemoglobin A1c of 6.9 she is status post pancreatic transplant.  #10 R BKA site looks clean dry and intact.  # 11 CKD STAGE 3 A creatinine stable.  At baseline.  #13 urinary retention Foley was placed initially on January 21  #14 goals of care discussed in detail with patient's husband.  He makes decisions for his wife when she cannot.  He has changed her CODE STATUS to DNR/DNI.  However he wants to continue antibiotics and fluids and PEG tube feedings to see if she will return back to her baseline.  With multiple comorbidities and poor functional status poor nutritional status her long-term prognosis is extremely poor.  This was discussed with her husband [name].  #15 she was tested Covid positive on January 30 at [facility] she has recovered from [facility] and it has been more than 21 days will DC precautions.  Discussed with ID.  Pressure Injury 12/10/20 Heel Left;Posterior Stage 2 -  Partial thickness loss of dermis presenting as a shallow open injury with a red, pink wound bed without slough. (Active)  12/10/20 1325  Location: Heel  Location Orientation: Left;Posterior  Staging: Stage 2 -  Partial thickness loss of dermis presenting as a shallow open injury with a red, pink wound bed without slough.  Wound Description (Comments):   Present on Admission: Yes     Pressure Injury 12/24/20 Sacrum Stage 4 - Full thickness tissue loss with exposed bone, tendon or muscle. (Active)  12/24/20   Location: Sacrum  Location Orientation:   Staging: Stage 4 - Full thickness tissue loss with exposed bone, tendon or muscle.  Wound Description (Comments):   Present on Admission: Yes    Estimated body mass index is 17.64 kg/m as calculated from the following:   Height as of this encounter: 5' 3.6" (1.615 m).   Weight as of this encounter: 46  kg.  DVT prophylaxis: Lovenox Code Status: DNR Family Communication: Discussed with husband [name] over the phone  disposition Plan:  Status is: Inpatient  Dispo: The patient is from: SNF              Anticipated d/c is to: SNF              Patient currently is not medically stable to d/c.   Difficult to place patient NO    Consultants: NONE  Procedures: NONE Antimicrobials: VANC ZOSYN  Subjective: Patient is resting in bed.  She does not respond to any verbal stimuli or verbal commands.  Does not answer any questions. She did respond to sternal rub by opening her eyes looked at me and then went back to sleep.  Objective: Vitals:   02/26/21 0629 02/26/21 0902 02/26/21 0912 02/26/21 0923  BP: 132/68 129/69 125/68 123/65  Pulse: 73 77 78 79  Resp: 20 12 (!) 22 (!) 22  Temp: (!) 97.5 F (36.4 C) (!) 97.5 F (36.4 C) (!) 97.4 F (36.3 C) (!) 97.5 F (36.4 C)  TempSrc: Oral Axillary Axillary Axillary  SpO2: 97% 98% 98% 98%  Weight:      Height:        Intake/Output Summary (Last 24 hours) at 02/26/2021 0950 Last data filed at 02/26/2021 7195 Gross per 24 hour  Intake 2981.43 ml  Output 1050 ml  Net 1931.43 ml   Filed Weights   02/26/21 0512  Weight: 46 kg    Examination: PEG tube and Foley catheter in place  General exam: Chronically ill-appearing not responding to questions or commands Respiratory system: Clear to auscultation. Respiratory effort normal. Cardiovascular system: S1 & S2 heard, RRR. No JVD, murmurs, rubs, gallops or clicks. No pedal edema. Gastrointestinal system: Abdomen is nondistended, soft and nontender. No organomegaly or masses felt. Normal bowel sounds heard.  PEG tube in place Central nervous system: Confused not responding to questions or commands extremities: Right BKA stump clean dry intact Skin: Sacral decubitus and left heel stage II Psychiatry: Cannot assess at this time as patient is not responding to verbal  stimuli.    Data  Reviewed: I have personally reviewed following labs and imaging studies  CBC: Recent Labs  Lab 02/25/21 2000  WBC 7.9  NEUTROABS 4.6  HGB 6.9*  HCT 21.3*  MCV 103.4*  PLT 974*   Basic Metabolic Panel: Recent Labs  Lab 02/25/21 2000  NA 135  K 5.1  CL 104  CO2 22  GLUCOSE 183*  BUN 36*  CREATININE 0.66  CALCIUM 9.3   GFR: Estimated Creatinine Clearance: 53.6 mL/min (by C-G formula based on SCr of 0.66 mg/dL). Liver Function Tests: Recent Labs  Lab 02/25/21 2000  AST 16  ALT 12  ALKPHOS 106  BILITOT 0.6  PROT 5.6*  ALBUMIN 2.1*   No results for input(s): LIPASE, AMYLASE in the last 168 hours. No results for input(s): AMMONIA in the last 168 hours. Coagulation Profile: No results for input(s): INR, PROTIME in the last 168 hours. Cardiac Enzymes: No results for input(s): CKTOTAL, CKMB, CKMBINDEX, TROPONINI in the last 168 hours. BNP (last 3 results) No results for input(s): PROBNP in the last 8760 hours. HbA1C: Recent Labs    02/25/21 2000  HGBA1C 7.9*   CBG: Recent Labs  Lab 02/26/21 0510 02/26/21 0734  GLUCAP 125* 141*   Lipid Profile: No results for input(s): CHOL, HDL, LDLCALC, TRIG, CHOLHDL, LDLDIRECT in the last 72 hours. Thyroid Function Tests: No results for input(s): TSH, T4TOTAL, FREET4, T3FREE, THYROIDAB in the last 72 hours. Anemia Panel: No results for input(s): VITAMINB12, FOLATE, FERRITIN, TIBC, IRON, RETICCTPCT in the last 72 hours. Sepsis Labs: Recent Labs  Lab 02/25/21 2000 02/25/21 2130  LATICACIDVEN 0.6 1.0    Recent Results (from the past 240 hour(s))  Resp Panel by RT-PCR (Flu A&B, Covid) Nasopharyngeal Swab     Status: Abnormal   Collection Time: 02/25/21 11:45 PM   Specimen: Nasopharyngeal Swab; Nasopharyngeal(NP) swabs in vial transport medium  Result Value Ref Range Status   SARS Coronavirus 2 by RT PCR POSITIVE (A) NEGATIVE Final    Comment: RESULT CALLED TO, READ BACK BY AND VERIFIED WITH: SMITH J.03.03.22 @ 0124  BY MECIAL MECIAL J. (NOTE) SARS-CoV-2 target nucleic acids are DETECTED.  The SARS-CoV-2 RNA is generally detectable in upper respiratory specimens during the acute phase of infection. Positive results are indicative of the presence of the identified virus, but do not rule out bacterial infection or co-infection with other pathogens not detected by the test. Clinical correlation with patient history and other diagnostic information  is necessary to determine patient infection status. The expected result is Negative.  Fact Sheet for Patients: EntrepreneurPulse.com.au  Fact Sheet for Healthcare Providers: IncredibleEmployment.be  This test is not yet approved or cleared by the [place] FDA and  has been authorized for detection and/or diagnosis of SARS-CoV-2 by FDA under an Emergency Use Authorization (EUA).  This EUA will remain in effect (meaning this t est can be used) for the duration of  the COVID-19 declaration under Section 564(b)(1) of the Act, 21 U.S.C. section 360bbb-3(b)(1), unless the authorization is terminated or revoked sooner.     Influenza A by PCR NEGATIVE NEGATIVE Final   Influenza B by PCR NEGATIVE NEGATIVE Final    Comment: (NOTE) The Xpert Xpress SARS-CoV-2/FLU/RSV plus assay is intended as an aid in the diagnosis of influenza from Nasopharyngeal swab specimens and should not be used as a sole basis for treatment. Nasal washings and aspirates are unacceptable for Xpert Xpress SARS-CoV-2/FLU/RSV testing.  Fact Sheet for Patients: EntrepreneurPulse.com.au  Fact Sheet for Healthcare Providers: IncredibleEmployment.be  This test is not yet approved or cleared by the [place] FDA and has been authorized for detection and/or diagnosis of SARS-CoV-2 by FDA under an Emergency Use Authorization (EUA). This EUA will remain in effect (meaning this test can be used) for the duration of  the COVID-19 declaration under Section 564(b)(1) of the Act, 21 U.S.C. section 360bbb-3(b)(1), unless the authorization is terminated or revoked.  Performed at [facility], [place] [address] [place], [place] [address]          Radiology Studies: DG Chest 1 View  Result Date: 02/25/2021 CLINICAL DATA:  Stage IV decubitus sacral ulcer, diabetes, altered level of consciousness EXAM: CHEST  1 VIEW COMPARISON:  01/18/2021 FINDINGS: Single frontal view of the chest demonstrates a stable cardiac silhouette. There is streaky consolidation at the left lung base which may reflect atelectasis or airspace disease. No effusion or pneumothorax. No acute bony abnormalities. Prior healed right rib fractures. IMPRESSION: 1. Streaky left basilar consolidation consistent with atelectasis or airspace disease. Electronically Signed   By: Randa Ngo M.D.   On: 02/25/2021 20:04   CT Head Wo Contrast  Result Date: 02/25/2021 CLINICAL DATA:  Mental status change EXAM: CT HEAD WITHOUT CONTRAST TECHNIQUE: Contiguous axial images were obtained from the base of the skull through the vertex without intravenous contrast. COMPARISON:  MRI 01/23/2021, CT 01/18/2021 FINDINGS: Brain: No acute territorial infarction, hemorrhage or intracranial mass. Moderate atrophy. Moderate hypodensity in the white matter consistent with chronic small vessel ischemic change. Stable ventricle size. Vascular: No hyperdense vessels. Vertebral and carotid vascular calcification Skull: Normal. Negative for fracture or focal lesion. Sinuses/Orbits: Bilateral mastoid effusions. Other: None IMPRESSION: 1. No CT evidence for acute intracranial abnormality. 2. Atrophy and chronic small vessel ischemic changes of the white matter. 3. Bilateral mastoid effusion Electronically Signed   By: Donavan Foil M.D.   On: 02/25/2021 22:26   CT Abdomen Pelvis W Contrast  Addendum Date: 02/25/2021   ADDENDUM REPORT: 02/25/2021 22:47 ADDENDUM:  Slightly dilated extrahepatic common bile duct, recommend correlation with LFTs with follow-up MRCP as indicated Electronically Signed   By: Donavan Foil M.D.   On: 02/25/2021 22:47   Result Date: 02/25/2021 CLINICAL DATA:  Abdominal pain sacral ulcer EXAM: CT ABDOMEN AND PELVIS WITH CONTRAST TECHNIQUE: Multidetector CT imaging of the abdomen and pelvis was performed using the standard protocol following bolus administration of intravenous contrast. CONTRAST:  151m OMNIPAQUE IOHEXOL 300 MG/ML  SOLN  COMPARISON:  CT 12/07/2020, ultrasound 01/21/2021, 10/29/2020 FINDINGS: Lower chest: Lung bases demonstrate partial consolidations within the bilateral lower lobes. Cardiomegaly with coronary vascular calcification. Circumferential distal esophageal thickening. Hepatobiliary: No focal hepatic abnormality. Markedly distended gallbladder containing stones. Extrahepatic common bile duct slightly enlarged at 8 mm at the head of pancreas. Pancreas: Mild pancreatic ductal dilatation. No inflammatory changes. Spleen: Normal in size without focal abnormality. Adrenals/Urinary Tract: Adrenal glands are normal. Atrophic native kidneys without hydronephrosis. Transplanted kidney within the pelvis slightly to the left of midline. Cyst in the lower pole of the transplanted kidney. No hydronephrosis. Urinary bladder obscured by artifact from right hip hardware. There is a Foley catheter in the bladder. Bladder appears thick walled. Stomach/Bowel: Gastrostomy tube within the body of the stomach. No dilated small bowel. No acute bowel wall thickening Vascular/Lymphatic: Extensive aortic atherosclerosis. 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 No suspicious nodes. Reproductive: No obvious adnexal mass Other: No free air. There is free fluid in the pelvis. There is generalized anasarca. Musculoskeletal: Deep sacral decubitus ulcer without drainable abscess or definitive osseous destructive change.  IMPRESSION: 1. Deep sacral decubitus ulcer without drainable soft tissue abscess or definitive osseous destructive change of the distal sacrum or coccyx. 2. Markedly distended gallbladder containing stones. Ultrasound correlation if concern for acute gallbladder disease. 3. Partial consolidations within the bilateral lower lobes, atelectasis versus pneumonia. 4. Atrophic native kidneys. Transplanted kidney within the pelvis slightly to the left of midline without definitive hydronephrosis. Possible thick-walled appearance of the urinary bladder, question cystitis. 5. Small amount of free fluid in the pelvis. Generalized anasarca. 6. Circumferential distal esophageal thickening, could be secondary to reflux or esophagitis. Aortic Atherosclerosis (ICD10-I70.0). Electronically Signed: By: Donavan Foil M.D. On: 02/25/2021 22:40   US Abdomen Limited RUQ (LIVER/GB)  Result Date: 02/25/2021 CLINICAL DATA:  Distended gallbladder abnormal CT EXAM: ULTRASOUND ABDOMEN LIMITED RIGHT UPPER QUADRANT COMPARISON:  CT 02/25/2021 FINDINGS: Gallbladder: Multiple shadowing stones measuring up to 3.2 cm. Normal wall thickness. Unable to reliably assess sonographic [place]. Trace pericholecystic fluid. Common bile duct: Diameter: 7 mm Liver: No focal lesion identified. Within normal limits in parenchymal echogenicity. Portal vein is patent on color Doppler imaging with normal direction of blood flow towards the liver. Other: None. IMPRESSION: 1. Distended gallbladder containing stones. There is trace pericholecystic fluid but normal wall thickness. Unable to reliably assess sonographic [place]. Findings are indeterminate for cholecystitis; correlation with nuclear medicine hepatobiliary could be obtained. 2. Common bile duct diameter upper normal, correlate with LFTs Electronically Signed   By: Donavan Foil M.D.   On: 02/25/2021 23:36        Scheduled Meds: . sodium chloride   Intravenous Once  . aspirin EC  81 mg Oral Daily   . atorvastatin  10 mg Oral Daily  . azaTHIOprine  50 mg Oral Daily  . B-complex with vitamin C  1 tablet Oral Daily  . Chlorhexidine Gluconate Cloth  6 each Topical Daily  . cholestyramine light  4 g Oral Q12H  . collagenase   Topical Daily  . cycloSPORINE  75 mg Oral BID  . enoxaparin (LOVENOX) injection  30 mg Subcutaneous Q24H  . hydrocortisone sod succinate (SOLU-CORTEF) inj  50 mg Intravenous Q6H  . insulin aspart  0-9 Units Subcutaneous Q4H  . levothyroxine  88 mcg Oral QAC breakfast  . pantoprazole  40 mg Oral Daily  . sertraline  25 mg Oral Daily  . sodium bicarbonate  650 mg Oral BID  Continuous Infusions: . piperacillin-tazobactam (ZOSYN)  IV 3.375 g (02/26/21 0555)  . vancomycin       LOS: 1 day   Georgette Shell, MD 02/26/2021, 9:50 AM

## 2021-02-26 NOTE — ED Notes (Signed)
Jasmie Andrews (husband) [phone]

## 2021-02-26 NOTE — Progress Notes (Signed)
Initial Nutrition Assessment  DOCUMENTATION CODES:   Underweight  INTERVENTION:  - will order TF: Glucerna 1.5 @ 20 ml/hr advance by 10 ml every 8 hours to reach goal rate of 50 ml/hr with 150 ml free water every 4 hours.  - will order Juven BID, each packet provides 95 calories, 2.5 grams of protein (collagen), and 9.8 grams of carbohydrate (3 grams sugar); also contains 7 grams of L-arginine and L-glutamine, 300 mg vitamin C, 15 mg vitamin E, 1.2 mcg vitamin B-12, 9.5 mg zinc, 200 mg calcium, and 1.5 g  Calcium Beta-hydroxy-Beta-methylbutyrate to support wound healing  - at goal rate, this regimen will provide 1990 kcal, 104 grams protein, and 1811 ml free water.   Monitor magnesium, potassium, and phosphorus daily for at least 3 days, MD to replete as needed, as pt is at risk for refeeding syndrome given likely severe malnutrition, unknown TF regimen PTA and how long she may have been without nutrition, 8% wt loss in <1.5 months.    NUTRITION DIAGNOSIS:   Increased nutrient needs related to acute illness,wound healing as evidenced by estimated needs.  GOAL:   Patient will meet greater than or equal to 90% of their needs  MONITOR:   Diet advancement,TF tolerance,Labs,Weight trends,Skin  REASON FOR ASSESSMENT:   Malnutrition Screening Tool,Consult Enteral/tube feeding initiation and management  ASSESSMENT:   60 yo female with medical history of type 1 DM, HTN, R BKA in 09/2020, renal transplant on immunosuppressants, PEG placement. She has been hospitalized multiple times in the past 6 months. She was diagnosed with COVID at [facility] on 01/25/21. She is DNR/DNI. CT abdomen/pelvis showed possible acute cholecystitis and enlarged gallbladder.  Patient is noted to be disoriented x4. She is NPO. PEG in place.  Able to talk with her husband on the phone, but he is unsure of what TF formula she was receiving PTA or what TF regimen was (continuous vs bolus). He states that patient was  discharged from [facility] yesterday. Unable to obtain, at this time, documentation from that facility which outlines TF regimen.   Patient was last seen by this [place] RD on 01/06/21 at which time she was on a Carb Modified diet and consuming Ensure orally. At that time she met criteria for moderate malnutrition related to chronic illness as evidenced by mild fat depletion, mild and moderate muscle depletion.   Unable to determine date of PEG placement; notes indicate PEG placed while patient was at [facility] after January hospitalization.  Weight today is 101 lb and weight on 01/17/21 was 110 lb. This indicates 9 lb weight loss (8.2% body weight) in <1.5 months; significant for time frame.   Suspect patient now meets criteria for severe malnutrition if weight loss is accurate.  Per notes: - acute toxic metabolic encephalopathy - SIRS - anemia of chronic disease - WOC following d/t stage 2 and stage 4 wounds - possible acute cholecystitis - DNR/DNI - plan to continue abx and TF to determine if she returns to baseline; extremely poor prognosis long-term   Labs reviewed; HgbA1c: 6.9%, CBGs: 125, 141, 240 mg/dl, BUN: 36 mg/dl.  Medications reviewed; 1 tablet vitamin B-complex with vitamin C/day, 50 mg solu-cortef QID, sliding scale novolog, 88 mcg oral synthroid/day, 40 mg oral protonix/day, 650 mg sodium bicarb BID.    NUTRITION - FOCUSED PHYSICAL EXAM:  unable to complete at this time.   Diet Order:   Diet Order    None      EDUCATION NEEDS:  Not appropriate for education at this time  Skin:  Skin Assessment: Skin Integrity Issues: Skin Integrity Issues:: Stage II,Stage IV Stage II: L heel Stage IV: sacrum  Last BM:  3/3 (type 4 x1)  Height:   Ht Readings from Last 1 Encounters:  02/26/21 5' 3.6" (1.615 m)    Weight:   Wt Readings from Last 1 Encounters:  02/26/21 46 kg     Estimated Nutritional Needs:  Kcal:  1840-2050 kcal Protein:   90-105 grams Fluid:  >/= 2.2 L/day      Jarome Matin, MS, RD, LDN, CNSC Inpatient Clinical Dietitian RD pager # available in [facility]  After hours/weekend pager # available in [facility]

## 2021-02-26 NOTE — Progress Notes (Signed)
OW:[id number] [facility]) Hospital Liaison note.  This is a current outpatient-based palliative care patient with [facility]. Will follow for disposition.   Please call with any questions or concerns related to outpatient palliative care.  Thank you,  Lorelee Market, [facility] Liaison [phone]

## 2021-02-27 DIAGNOSIS — G9341 Metabolic encephalopathy: Secondary | ICD-10-CM | POA: Diagnosis not present

## 2021-02-27 LAB — TYPE AND SCREEN
ABO/RH(D): O POS
Antibody Screen: NEGATIVE
Unit division: 0
Unit division: 0

## 2021-02-27 LAB — BPAM RBC
Blood Product Expiration Date: 202203312359
Blood Product Expiration Date: 202203312359
ISSUE DATE / TIME: 202203030332
ISSUE DATE / TIME: 202203030854
Unit Type and Rh: 5100
Unit Type and Rh: 5100

## 2021-02-27 LAB — CBC
HCT: 31.4 % — ABNORMAL LOW (ref 36.0–46.0)
Hemoglobin: 9.9 g/dL — ABNORMAL LOW (ref 12.0–15.0)
MCH: 31.2 pg (ref 26.0–34.0)
MCHC: 31.5 g/dL (ref 30.0–36.0)
MCV: 99.1 fL (ref 80.0–100.0)
Platelets: 369 10*3/uL (ref 150–400)
RBC: 3.17 MIL/uL — ABNORMAL LOW (ref 3.87–5.11)
RDW: 20.7 % — ABNORMAL HIGH (ref 11.5–15.5)
WBC: 6.8 10*3/uL (ref 4.0–10.5)
nRBC: 0 % (ref 0.0–0.2)

## 2021-02-27 LAB — COMPREHENSIVE METABOLIC PANEL
ALT: 13 U/L (ref 0–44)
AST: 25 U/L (ref 15–41)
Albumin: 2 g/dL — ABNORMAL LOW (ref 3.5–5.0)
Alkaline Phosphatase: 93 U/L (ref 38–126)
Anion gap: 12 (ref 5–15)
BUN: 45 mg/dL — ABNORMAL HIGH (ref 8–23)
CO2: 18 mmol/L — ABNORMAL LOW (ref 22–32)
Calcium: 9.2 mg/dL (ref 8.9–10.3)
Chloride: 104 mmol/L (ref 98–111)
Creatinine, Ser: 1 mg/dL (ref 0.44–1.00)
GFR, Estimated: >60 mL/min (ref >60)
Glucose, Bld: 465 mg/dL — ABNORMAL HIGH (ref 70–99)
Potassium: 3.2 mmol/L — ABNORMAL LOW (ref 3.5–5.1)
Sodium: 134 mmol/L — ABNORMAL LOW (ref 135–145)
Total Bilirubin: 0.9 mg/dL (ref 0.3–1.2)
Total Protein: 5.5 g/dL — ABNORMAL LOW (ref 6.5–8.1)

## 2021-02-27 LAB — MAGNESIUM
Magnesium: 1.8 mg/dL (ref 1.7–2.4)
Magnesium: 1.8 mg/dL (ref 1.7–2.4)

## 2021-02-27 LAB — PHOSPHORUS
Phosphorus: 3.4 mg/dL (ref 2.5–4.6)
Phosphorus: 2.9 mg/dL (ref 2.5–4.6)

## 2021-02-27 LAB — GLUCOSE, CAPILLARY
Glucose-Capillary: 513 mg/dL (ref 70–99)
Glucose-Capillary: 470 mg/dL — ABNORMAL HIGH (ref 70–99)
Glucose-Capillary: 422 mg/dL — ABNORMAL HIGH (ref 70–99)
Glucose-Capillary: 393 mg/dL — ABNORMAL HIGH (ref 70–99)
Glucose-Capillary: 282 mg/dL — ABNORMAL HIGH (ref 70–99)
Glucose-Capillary: 234 mg/dL — ABNORMAL HIGH (ref 70–99)
Glucose-Capillary: 296 mg/dL — ABNORMAL HIGH (ref 70–99)
Glucose-Capillary: 272 mg/dL — ABNORMAL HIGH (ref 70–99)

## 2021-02-27 MED ORDER — INSULIN ASPART 100 UNIT/ML ~~LOC~~ SOLN
3.0000 [IU] | Freq: Three times a day (TID) | SUBCUTANEOUS | Status: DC
  Administered 2021-02-27 – 2021-03-02 (×8): 3 [IU] via SUBCUTANEOUS

## 2021-02-27 MED ORDER — PREDNISONE 5 MG PO TABS
5.0000 mg | ORAL_TABLET | Freq: Every day | ORAL | Status: DC
  Administered 2021-02-28 – 2021-03-03 (×4): 5 mg via ORAL
  Filled 2021-02-27 (×4): qty 1

## 2021-02-27 MED ORDER — INSULIN GLARGINE 100 UNIT/ML ~~LOC~~ SOLN
4.0000 [IU] | Freq: Two times a day (BID) | SUBCUTANEOUS | Status: DC
  Administered 2021-02-27 – 2021-02-28 (×4): 4 [IU] via SUBCUTANEOUS
  Filled 2021-02-27 (×5): qty 0.04

## 2021-02-27 MED ORDER — INSULIN ASPART 100 UNIT/ML ~~LOC~~ SOLN
25.0000 [IU] | Freq: Once | SUBCUTANEOUS | Status: AC
  Administered 2021-02-27: 25 [IU] via SUBCUTANEOUS

## 2021-02-27 NOTE — Progress Notes (Signed)
PHARMACY NOTE -  [employer] has been assisting with dosing of Zosyn for wound infxn.  Dosage remains stable at 3.375 g IV q8 hr and further renal adjustments per institutional Pharmacy antibiotic protocol   Pharmacy will sign off, following peripherally for culture results or dose adjustments. Please reconsult if a change in clinical status warrants re-evaluation of dosage.  Reuel Boom, PharmD, BCPS [phone] 02/27/2021, 11:28 AM

## 2021-02-27 NOTE — Progress Notes (Signed)
PROGRESS NOTE    [name]  OIT:[id number] DOB: Jul 23, 1961 DOA: 02/25/2021 PCP: [name], MD   Brief Narrative: 60 yo female DM TYPE I FTT HTN R BKA RENAL TRANSPLANT on immunosuppressants.  History obtained from patient's husband and ER records.  Multiple hospital admissions in the last 6 months reviewed in the chart. Per husband patient had a right foot amputation in October 2021, she was discharged to a [place] for 3 weeks where she developed an AKI from dehydration not eating and drinking and she was sent to [facility] for 2 weeks AKI resolved.  She was sent to [place] home where she started to have nausea vomiting and diarrhea per husband she had food poisoning and was sent to [facility] for 1 week and was sent out to [facility] again for 3 weeks approximately she had an infected bedsore she was again sent back to [facility] and then discharged to [facility] for 1 week she was again admitted to [facility] for dehydration/sepsis and sacral infected decubitus with osteomyelitis PICC line placed IV antibiotics given.  Then discharged to [facility] from where she was discharged to [facility] where she was treated with antibiotics for questionable source of infection and then she was discharged to home.  Per husband he was going to take care of her at home with home health.  However patient was more in the state of unresponsiveness and he was not able to care for her. Per husband patient's baseline she was awake alert talking walking prior to amputation.  She had a PEG tube placed at [place].  She got diagnosed with Covid in [facility] around January 30. He wants [place] to do everything possible for her to get her back to her baseline.  However he did agree to DNR/DNI. In the ED CT of the abdomen and pelvis shows possible acute cholecystitis and large gallbladder.  Normal LFTs and white count.  However her blood pressure was low at  85 in the ED.  T-max was 99 heart rate was 102.  Patient is on multiple immunosuppressants including cyclosporine Imuran and prednisone.  Assessment & Plan:   Principal Problem:   Acute metabolic encephalopathy Active Problems:   S/p cadaver renal transplant   Sacral decubitus ulcer, stage IV (HCC)   Type 1 diabetes mellitus with hyperlipidemia (HCC)   Unspecified severe protein-calorie malnutrition (HCC)   Acute lower UTI   Anemia   # 1 Acute Toxic metabolic encephalopathy-this is thought to be secondary to multifactorial etiologies including Pseudomonas UTI UTI, SENSITIVITIES PENDING, SEVERE ANEMIA  stage IV sacral decubitus, possible acute cholecystitis .Per husband her baseline mental status is she is awake and alert and answer questions appropriately. Since her amputation of the right lower extremity in October 2021 she has been deteriorating. She has been in and out of hospitals and nursing home since then. CT of the abdomen and pelvis this admission does not reveal any evidence of osteomyelitis in the sacral region. Will DC vancomycin and continue Zosyn and narrow antibiotics depending on sensitivities. She is more awake today.  Very hard of hearing.  #2 SIRS patient met SIRS criteria on admission with hypotension systolic blood pressure less than 90 with tachycardia with a heart rate above 100.  Lactic level was normal white count was normal though she is immunosuppressed from chronic prednisone Imuran and cyclosporine.  #3 anemia of chronic disease hemoglobin 6.9 she got 2 units of blood transfusion hemoglobin up  to 9.9.   FOBT NEGATIVE.  #4Stage 4 sacral decubitus and stage II left heel-POA.  Seen by wound care continue their recommendations.  #5?ACUTE cholecystitis by CT scan.  Ultrasound shows distended gallbladder containing stones with trace pericholecystic fluid but normal wall thickness.  Recommending a HIDA scan.  Common bile duct diameter upper normal.  Normal wbc and  normal Lfts   #6 s/p renal transplant -on imuran solumedrol and cyclosporine  #7 hypoalbumenemia dietary consulted restart tube feedings.  #8 hypothyroidsm continue Synthroid.  #9type 1 DM hemoglobin A1c of 6.9 she is status post pancreatic transplant.  Added Lantus 4 units twice a day with NovoLog 3 units every 4. CBG (last 3)  Recent Labs    02/27/21 0521 02/27/21 0752 02/27/21 1113  GLUCAP 393* 282* 234*     #10 R BKA site looks clean dry and intact.  # 11 CKD STAGE 3 A creatinine stable.  At baseline.  #13 urinary retention Foley was placed initially on January 21  #14 goals of care discussed in detail with patient's husband.  He makes decisions for his wife when she cannot.  He has changed her CODE STATUS to DNR/DNI.  However he wants to continue antibiotics and fluids and PEG tube feedings to see if she will return back to her baseline.  With multiple comorbidities and poor functional status poor nutritional status her long-term prognosis is extremely poor.  This was discussed with her husband [name].  #15 she was tested Covid positive on January 30 at [facility] she has recovered from [facility] and it has been more than 21 days will DC precautions.  Discussed with ID.  Pressure Injury 12/10/20 Heel Left;Posterior Stage 2 -  Partial thickness loss of dermis presenting as a shallow open injury with a red, pink wound bed without slough. (Active)  12/10/20 1325  Location: Heel  Location Orientation: Left;Posterior  Staging: Stage 2 -  Partial thickness loss of dermis presenting as a shallow open injury with a red, pink wound bed without slough.  Wound Description (Comments):   Present on Admission: Yes     Pressure Injury 12/24/20 Sacrum Stage 4 - Full thickness tissue loss with exposed bone, tendon or muscle. (Active)  12/24/20   Location: Sacrum  Location Orientation:   Staging: Stage 4 - Full thickness tissue loss with exposed bone, tendon or muscle.  Wound Description  (Comments):   Present on Admission: Yes    Estimated body mass index is 17.64 kg/m as calculated from the following:   Height as of this encounter: 5' 3.6" (1.615 m).   Weight as of this encounter: 46 kg.  DVT prophylaxis: Lovenox Code Status: DNR Family Communication: Discussed with husband [name] over the phone  disposition Plan:  Status is: Inpatient  Dispo: The patient is from: SNF              Anticipated d/c is to: SNF              Patient currently is not medically stable to d/c.   Difficult to place patient NO    Consultants: NONE  Procedures: NONE Antimicrobials: VANC ZOSYN  Subjective: Patient is resting in bed.  She does not respond to any verbal stimuli or verbal commands.  Does not answer any questions. She did respond to sternal rub by opening her eyes looked at me and then went back to sleep.  Objective: Vitals:   02/26/21 2009 02/27/21 0526 02/27/21 1214 02/27/21 1253  BP: 120/66 (!) 153/74 Marland Kitchen)  151/87 (!) 154/85  Pulse: 95 75 75 70  Resp: 20 20 (!) 22   Temp: 97.9 F (36.6 C) 98.2 F (36.8 C) 98.2 F (36.8 C)   TempSrc: Oral Oral Oral   SpO2: 98% 100% 99%   Weight:      Height:        Intake/Output Summary (Last 24 hours) at 02/27/2021 1504 Last data filed at 02/27/2021 0956 Gross per 24 hour  Intake 1564.09 ml  Output 1200 ml  Net 364.09 ml   Filed Weights   02/26/21 0512  Weight: 46 kg    Examination: PEG tube and Foley catheter in place  General exam: Chronically ill-appearing not responding to questions or commands Respiratory system: Clear to auscultation. Respiratory effort normal. Cardiovascular system: S1 & S2 heard, RRR. No JVD, murmurs, rubs, gallops or clicks. No pedal edema. Gastrointestinal system: Abdomen is nondistended, soft and nontender. No organomegaly or masses felt. Normal bowel sounds heard.  PEG tube in place Central nervous system: Confused not responding to questions or commands extremities: Right BKA stump clean dry  intact Skin: Sacral decubitus and left heel stage II Psychiatry: Cannot assess at this time as patient is not responding to verbal  stimuli.    Data Reviewed: I have personally reviewed following labs and imaging studies  CBC: Recent Labs  Lab 02/25/21 2000 02/26/21 1306 02/27/21 0341  WBC 7.9 5.4 6.8  NEUTROABS 4.6  --   --   HGB 6.9* 11.2* 9.9*  HCT 21.3* 34.6* 31.4*  MCV 103.4* 97.5 99.1  PLT 403* 361 329   Basic Metabolic Panel: Recent Labs  Lab 02/25/21 2000 02/26/21 1306 02/26/21 1638 02/26/21 2051 02/27/21 0341  NA 135 140  --   --  134*  K 5.1 4.7  --   --  3.2*  CL 104 108  --   --  104  CO2 22 20*  --   --  18*  GLUCOSE 183* 245*  --  570* 465*  BUN 36* 35*  --   --  45*  CREATININE 0.66 0.73  --   --  1.00  CALCIUM 9.3 9.4  --   --  9.2  MG  --  1.9 1.9  --  1.8  PHOS  --  4.2 4.3  --  3.4   GFR: Estimated Creatinine Clearance: 42.9 mL/min (by C-G formula based on SCr of 1 mg/dL). Liver Function Tests: Recent Labs  Lab 02/25/21 2000 02/26/21 1306 02/27/21 0341  AST '16 18 25  ' ALT '12 12 13  ' ALKPHOS 106 110 93  BILITOT 0.6 0.9 0.9  PROT 5.6* 5.9* 5.5*  ALBUMIN 2.1* 2.0* 2.0*   No results for input(s): LIPASE, AMYLASE in the last 168 hours. No results for input(s): AMMONIA in the last 168 hours. Coagulation Profile: No results for input(s): INR, PROTIME in the last 168 hours. Cardiac Enzymes: No results for input(s): CKTOTAL, CKMB, CKMBINDEX, TROPONINI in the last 168 hours. BNP (last 3 results) No results for input(s): PROBNP in the last 8760 hours. HbA1C: Recent Labs    02/25/21 2000  HGBA1C 7.9*   CBG: Recent Labs  Lab 02/27/21 0157 02/27/21 0406 02/27/21 0521 02/27/21 0752 02/27/21 1113  GLUCAP 470* 422* 393* 282* 234*   Lipid Profile: No results for input(s): CHOL, HDL, LDLCALC, TRIG, CHOLHDL, LDLDIRECT in the last 72 hours. Thyroid Function Tests: No results for input(s): TSH, T4TOTAL, FREET4, T3FREE, THYROIDAB in the last  72 hours. Anemia Panel: No results for input(s):  VITAMINB12, FOLATE, FERRITIN, TIBC, IRON, RETICCTPCT in the last 72 hours. Sepsis Labs: Recent Labs  Lab 02/25/21 2000 02/25/21 2130  LATICACIDVEN 0.6 1.0    Recent Results (from the past 240 hour(s))  Urine culture     Status: Abnormal (Preliminary result)   Collection Time: 02/25/21  7:40 PM   Specimen: Urine, Clean Catch  Result Value Ref Range Status   Specimen Description   Final    URINE, CLEAN CATCH Performed at [facility], [place] [address], [place], [place] [address]    Special Requests   Final    NONE Performed at [facility], [place] [address] [place], [place] [address]    Culture (A)  Final    >=100,000 COLONIES/mL PSEUDOMONAS AERUGINOSA SUSCEPTIBILITIES TO FOLLOW Performed at [facility], [place] [address] [place], [place] [address]    Report Status PENDING  Incomplete  Culture, blood (routine x 2)     Status: None (Preliminary result)   Collection Time: 02/25/21  8:25 PM   Specimen: BLOOD LEFT WRIST  Result Value Ref Range Status   Specimen Description   Final    BLOOD LEFT WRIST Performed at [place] [address] [place], [place] [address]    Special Requests   Final    BOTTLES DRAWN AEROBIC AND ANAEROBIC Blood Culture adequate volume Performed at [place] [address], [place], [place] [address]    Culture   Final    NO GROWTH 1 DAY Performed at [facility], [place] [address] [place], [place] [address]    Report Status PENDING  Incomplete  Culture, blood (routine x 2)     Status: None (Preliminary result)   Collection Time: 02/25/21  8:30 PM   Specimen: BLOOD  Result Value Ref Range Status   Specimen Description   Final    BLOOD LEFT ANTECUBITAL Performed at [facility] [place] [address], [place], [place] [address]    Special Requests   Final    BOTTLES DRAWN AEROBIC AND ANAEROBIC  Blood Culture adequate volume Performed at [facility] [address], [place], [place] [address]    Culture   Final    NO GROWTH 1 DAY Performed at [facility], [place] [address], [place], [place] [address]    Report Status PENDING  Incomplete  Resp Panel by RT-PCR (Flu A&B, Covid) Nasopharyngeal Swab     Status: Abnormal   Collection Time: 02/25/21 11:45 PM   Specimen: Nasopharyngeal Swab; Nasopharyngeal(NP) swabs in vial transport medium  Result Value Ref Range Status   SARS Coronavirus 2 by RT PCR POSITIVE (A) NEGATIVE Final    Comment: RESULT CALLED TO, READ BACK BY AND VERIFIED WITH: SMITH J.03.03.22 @ 0124 BY MECIAL MECIAL J. (NOTE) SARS-CoV-2 target nucleic acids are DETECTED.  The SARS-CoV-2 RNA is generally detectable in upper respiratory specimens during the acute phase of infection. Positive results are indicative of the presence of the identified virus, but do not rule out bacterial infection or co-infection with other pathogens not detected by the test. Clinical correlation with patient history and other diagnostic information is necessary to determine patient infection status. The expected result is Negative.  Fact Sheet for Patients: EntrepreneurPulse.com.au  Fact Sheet for Healthcare Providers: IncredibleEmployment.be  This test is not yet approved or cleared by the [place] FDA and  has been authorized for detection and/or diagnosis of SARS-CoV-2 by FDA under an Emergency Use  Authorization (EUA).  This EUA will remain in effect (meaning this t est can be used) for the duration of  the COVID-19 declaration under Section 564(b)(1) of the Act, 21 U.S.C. section 360bbb-3(b)(1), unless the authorization is terminated or revoked sooner.     Influenza A by PCR NEGATIVE NEGATIVE Final   Influenza B by PCR NEGATIVE NEGATIVE Final    Comment: (NOTE) The Xpert Xpress SARS-CoV-2/FLU/RSV plus assay is  intended as an aid in the diagnosis of influenza from Nasopharyngeal swab specimens and should not be used as a sole basis for treatment. Nasal washings and aspirates are unacceptable for Xpert Xpress SARS-CoV-2/FLU/RSV testing.  Fact Sheet for Patients: EntrepreneurPulse.com.au  Fact Sheet for Healthcare Providers: IncredibleEmployment.be  This test is not yet approved or cleared by the [place] FDA and has been authorized for detection and/or diagnosis of SARS-CoV-2 by FDA under an Emergency Use Authorization (EUA). This EUA will remain in effect (meaning this test can be used) for the duration of the COVID-19 declaration under Section 564(b)(1) of the Act, 21 U.S.C. section 360bbb-3(b)(1), unless the authorization is terminated or revoked.  Performed at [facility], [place] [address] [place], [place] [address]          Radiology Studies: DG Chest 1 View  Result Date: 02/25/2021 CLINICAL DATA:  Stage IV decubitus sacral ulcer, diabetes, altered level of consciousness EXAM: CHEST  1 VIEW COMPARISON:  01/18/2021 FINDINGS: Single frontal view of the chest demonstrates a stable cardiac silhouette. There is streaky consolidation at the left lung base which may reflect atelectasis or airspace disease. No effusion or pneumothorax. No acute bony abnormalities. Prior healed right rib fractures. IMPRESSION: 1. Streaky left basilar consolidation consistent with atelectasis or airspace disease. Electronically Signed   By: Randa Ngo M.D.   On: 02/25/2021 20:04   CT Head Wo Contrast  Result Date: 02/25/2021 CLINICAL DATA:  Mental status change EXAM: CT HEAD WITHOUT CONTRAST TECHNIQUE: Contiguous axial images were obtained from the base of the skull through the vertex without intravenous contrast. COMPARISON:  MRI 01/23/2021, CT 01/18/2021 FINDINGS: Brain: No acute territorial infarction, hemorrhage or intracranial mass. Moderate  atrophy. Moderate hypodensity in the white matter consistent with chronic small vessel ischemic change. Stable ventricle size. Vascular: No hyperdense vessels. Vertebral and carotid vascular calcification Skull: Normal. Negative for fracture or focal lesion. Sinuses/Orbits: Bilateral mastoid effusions. Other: None IMPRESSION: 1. No CT evidence for acute intracranial abnormality. 2. Atrophy and chronic small vessel ischemic changes of the white matter. 3. Bilateral mastoid effusion Electronically Signed   By: Donavan Foil M.D.   On: 02/25/2021 22:26   CT Abdomen Pelvis W Contrast  Addendum Date: 02/25/2021   ADDENDUM REPORT: 02/25/2021 22:47 ADDENDUM: Slightly dilated extrahepatic common bile duct, recommend correlation with LFTs with follow-up MRCP as indicated Electronically Signed   By: Donavan Foil M.D.   On: 02/25/2021 22:47   Result Date: 02/25/2021 CLINICAL DATA:  Abdominal pain sacral ulcer EXAM: CT ABDOMEN AND PELVIS WITH CONTRAST TECHNIQUE: Multidetector CT imaging of the abdomen and pelvis was performed using the standard protocol following bolus administration of intravenous contrast. CONTRAST:  143m OMNIPAQUE IOHEXOL 300 MG/ML  SOLN COMPARISON:  CT 12/07/2020, ultrasound 01/21/2021, 10/29/2020 FINDINGS: Lower chest: Lung bases demonstrate partial consolidations within the bilateral lower lobes. Cardiomegaly with coronary vascular calcification. Circumferential distal esophageal thickening. Hepatobiliary: No focal hepatic abnormality. Markedly distended gallbladder containing stones. Extrahepatic common bile duct slightly enlarged at 8 mm at the head of pancreas. Pancreas: Mild pancreatic  ductal dilatation. No inflammatory changes. Spleen: Normal in size without focal abnormality. Adrenals/Urinary Tract: Adrenal glands are normal. Atrophic native kidneys without hydronephrosis. Transplanted kidney within the pelvis slightly to the left of midline. Cyst in the lower pole of the transplanted kidney.  No hydronephrosis. Urinary bladder obscured by artifact from right hip hardware. There is a Foley catheter in the bladder. Bladder appears thick walled. Stomach/Bowel: Gastrostomy tube within the body of the stomach. No dilated small bowel. No acute bowel wall thickening Vascular/Lymphatic: Extensive aortic atherosclerosis. 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 No suspicious nodes. Reproductive: No obvious adnexal mass Other: No free air. There is free fluid in the pelvis. There is generalized anasarca. Musculoskeletal: Deep sacral decubitus ulcer without drainable abscess or definitive osseous destructive change. IMPRESSION: 1. Deep sacral decubitus ulcer without drainable soft tissue abscess or definitive osseous destructive change of the distal sacrum or coccyx. 2. Markedly distended gallbladder containing stones. Ultrasound correlation if concern for acute gallbladder disease. 3. Partial consolidations within the bilateral lower lobes, atelectasis versus pneumonia. 4. Atrophic native kidneys. Transplanted kidney within the pelvis slightly to the left of midline without definitive hydronephrosis. Possible thick-walled appearance of the urinary bladder, question cystitis. 5. Small amount of free fluid in the pelvis. Generalized anasarca. 6. Circumferential distal esophageal thickening, could be secondary to reflux or esophagitis. Aortic Atherosclerosis (ICD10-I70.0). Electronically Signed: By: Donavan Foil M.D. On: 02/25/2021 22:40   US Abdomen Limited RUQ (LIVER/GB)  Result Date: 02/25/2021 CLINICAL DATA:  Distended gallbladder abnormal CT EXAM: ULTRASOUND ABDOMEN LIMITED RIGHT UPPER QUADRANT COMPARISON:  CT 02/25/2021 FINDINGS: Gallbladder: Multiple shadowing stones measuring up to 3.2 cm. Normal wall thickness. Unable to reliably assess sonographic [place]. Trace pericholecystic fluid. Common bile duct: Diameter: 7 mm Liver: No focal lesion identified. Within normal limits in  parenchymal echogenicity. Portal vein is patent on color Doppler imaging with normal direction of blood flow towards the liver. Other: None. IMPRESSION: 1. Distended gallbladder containing stones. There is trace pericholecystic fluid but normal wall thickness. Unable to reliably assess sonographic [place]. Findings are indeterminate for cholecystitis; correlation with nuclear medicine hepatobiliary could be obtained. 2. Common bile duct diameter upper normal, correlate with LFTs Electronically Signed   By: Donavan Foil M.D.   On: 02/25/2021 23:36        Scheduled Meds: . aspirin EC  81 mg Oral Daily  . atorvastatin  10 mg Oral Daily  . azaTHIOprine  50 mg Oral Daily  . B-complex with vitamin C  1 tablet Oral Daily  . Chlorhexidine Gluconate Cloth  6 each Topical Daily  . cholestyramine light  4 g Oral Q12H  . collagenase   Topical Daily  . cycloSPORINE  75 mg Oral BID  . enoxaparin (LOVENOX) injection  30 mg Subcutaneous Q24H  . free water  150 mL Per Tube Q4H  . hydrocortisone sod succinate (SOLU-CORTEF) inj  50 mg Intravenous Q6H  . insulin aspart  0-15 Units Subcutaneous Q4H  . insulin aspart  3 Units Subcutaneous TID WC  . insulin glargine  4 Units Subcutaneous BID  . levothyroxine  88 mcg Oral QAC breakfast  . pantoprazole  40 mg Oral Daily  . sertraline  25 mg Oral Daily  . sodium bicarbonate  650 mg Oral BID   Continuous Infusions: . feeding supplement (GLUCERNA 1.5 CAL) 1,000 mL (02/27/21 1233)  . piperacillin-tazobactam (ZOSYN)  IV 3.375 g (02/27/21 1247)     LOS: 2 days   Noland Fordyce  Rodena Piety, MD 02/27/2021, 3:04 PM

## 2021-02-27 NOTE — Progress Notes (Signed)
Inpatient Diabetes Program Recommendations  AACE/ADA: New Consensus Statement on Inpatient Glycemic Control (2015)  Target Ranges:  Prepandial:   less than 140 mg/dL      Peak postprandial:   less than 180 mg/dL (1-2 hours)      Critically ill patients:  140 - 180 mg/dL   Lab Results  Component Value Date   GLUCAP 282 (H) 02/27/2021   HGBA1C 7.9 (H) 02/25/2021    Review of Glycemic Control Results for CANDRA, ALTHEIDE (MRN [id number]) as of 02/27/2021 10:39  Ref. Range 02/26/2021 20:21 02/27/2021 01:57 02/27/2021 04:06 02/27/2021 05:21 02/27/2021 07:52  Glucose-Capillary Latest Ref Range: 70 - 99 mg/dL 523 (HH) 470 (H) 422 (H) 393 (H) 282 (H)    Diabetes history: DM1 Outpatient Diabetes medications: Lantus 10 units QD, Humalog 2-12 units TID with meals + 2 units TID with meals Current orders for Inpatient glycemic control: Novolog 0-15 units Q4H  On SoluCortef 50 mg Q6H  To start Glucerna TF at 50/H  Inpatient Diabetes Program Recommendations:     Add Lantus 6 units QD Add Novolog 2 units Q4H for TF coverage Secure chat sent to MD regarding recs.   Thanks, Bronson Curb, MSN, RNC-OB Diabetes Coordinator [phone] (8a-5p)

## 2021-02-28 DIAGNOSIS — G9341 Metabolic encephalopathy: Secondary | ICD-10-CM | POA: Diagnosis not present

## 2021-02-28 LAB — CBC
HCT: 31.4 % — ABNORMAL LOW (ref 36.0–46.0)
Hemoglobin: 10 g/dL — ABNORMAL LOW (ref 12.0–15.0)
MCH: 31.4 pg (ref 26.0–34.0)
MCHC: 31.8 g/dL (ref 30.0–36.0)
MCV: 98.7 fL (ref 80.0–100.0)
Platelets: 388 10*3/uL (ref 150–400)
RBC: 3.18 MIL/uL — ABNORMAL LOW (ref 3.87–5.11)
RDW: 20.1 % — ABNORMAL HIGH (ref 11.5–15.5)
WBC: 7.5 10*3/uL (ref 4.0–10.5)
nRBC: 0 % (ref 0.0–0.2)

## 2021-02-28 LAB — COMPREHENSIVE METABOLIC PANEL
ALT: 12 U/L (ref 0–44)
AST: 20 U/L (ref 15–41)
Albumin: 1.8 g/dL — ABNORMAL LOW (ref 3.5–5.0)
Alkaline Phosphatase: 80 U/L (ref 38–126)
Anion gap: 11 (ref 5–15)
BUN: 37 mg/dL — ABNORMAL HIGH (ref 8–23)
CO2: 23 mmol/L (ref 22–32)
Calcium: 9 mg/dL (ref 8.9–10.3)
Chloride: 103 mmol/L (ref 98–111)
Creatinine, Ser: 0.83 mg/dL (ref 0.44–1.00)
GFR, Estimated: >60 mL/min (ref >60)
Glucose, Bld: 282 mg/dL — ABNORMAL HIGH (ref 70–99)
Potassium: 3 mmol/L — ABNORMAL LOW (ref 3.5–5.1)
Sodium: 137 mmol/L (ref 135–145)
Total Bilirubin: 0.5 mg/dL (ref 0.3–1.2)
Total Protein: 5.3 g/dL — ABNORMAL LOW (ref 6.5–8.1)

## 2021-02-28 LAB — GLUCOSE, CAPILLARY
Glucose-Capillary: 142 mg/dL — ABNORMAL HIGH (ref 70–99)
Glucose-Capillary: 176 mg/dL — ABNORMAL HIGH (ref 70–99)
Glucose-Capillary: 179 mg/dL — ABNORMAL HIGH (ref 70–99)
Glucose-Capillary: 247 mg/dL — ABNORMAL HIGH (ref 70–99)
Glucose-Capillary: 265 mg/dL — ABNORMAL HIGH (ref 70–99)
Glucose-Capillary: 303 mg/dL — ABNORMAL HIGH (ref 70–99)
Glucose-Capillary: 94 mg/dL (ref 70–99)

## 2021-02-28 LAB — URINE CULTURE: Culture: >=100000 — AB

## 2021-02-28 LAB — MRSA PCR SCREENING: MRSA by PCR: NEGATIVE

## 2021-02-28 LAB — MAGNESIUM: Magnesium: 1.8 mg/dL (ref 1.7–2.4)

## 2021-02-28 MED ORDER — LOPERAMIDE HCL 2 MG PO CAPS
2.0000 mg | ORAL_CAPSULE | ORAL | Status: DC | PRN
  Administered 2021-02-28 – 2021-03-03 (×7): 2 mg via ORAL
  Filled 2021-02-28 (×7): qty 1

## 2021-02-28 MED ORDER — OXYCODONE HCL 5 MG PO TABS
5.0000 mg | ORAL_TABLET | Freq: Four times a day (QID) | ORAL | Status: DC | PRN
  Administered 2021-02-28 – 2021-03-03 (×6): 5 mg via ORAL
  Filled 2021-02-28 (×6): qty 1

## 2021-02-28 MED ORDER — POTASSIUM CHLORIDE CRYS ER 20 MEQ PO TBCR
40.0000 meq | EXTENDED_RELEASE_TABLET | ORAL | Status: AC
Start: 2021-02-28 — End: 2021-02-28
  Administered 2021-02-28 (×3): 40 meq via ORAL
  Filled 2021-02-28: qty 2

## 2021-02-28 MED ORDER — ZINC OXIDE 40 % EX OINT
TOPICAL_OINTMENT | CUTANEOUS | Status: DC | PRN
  Filled 2021-02-28: qty 57

## 2021-02-28 MED ORDER — CARBAMIDE PEROXIDE 6.5 % OT SOLN
5.0000 [drp] | Freq: Two times a day (BID) | OTIC | Status: DC
  Administered 2021-02-28 – 2021-03-03 (×6): 5 [drp] via OTIC
  Filled 2021-02-28 (×2): qty 15

## 2021-02-28 NOTE — Progress Notes (Signed)
PROGRESS NOTE    [name]  JGG:[id number] DOB: 1961/12/12 DOA: 02/25/2021 PCP: Doris Croak, MD   Brief Narrative: 60 yo female DM TYPE I FTT HTN R BKA RENAL TRANSPLANT on immunosuppressants.  History obtained from patient's husband and ER records.  Multiple hospital admissions in the last 6 months reviewed in the chart. Per husband patient had a right foot amputation in October 2021, she was discharged to a [place] for 3 weeks where she developed an AKI from dehydration not eating and drinking and she was sent to [facility] for 2 weeks AKI resolved.  She was sent to [place] home where she started to have nausea vomiting and diarrhea per husband she had food poisoning and was sent to [facility] long hospital for 1 week and was sent out to [facility] again for 3 weeks approximately she had an infected bedsore she was again sent back to [facility] and then discharged to [place] [facility] for 1 week she was again admitted to [facility] for dehydration/sepsis and sacral infected decubitus with osteomyelitis PICC line placed IV antibiotics given.  Then discharged to [facility] from where she was discharged to [facility] where she was treated with antibiotics for questionable source of infection and then she was discharged to home.  Per husband he was going to take care of her at home with home health.  However patient was more in the state of unresponsiveness and he was not able to care for her. Per husband patient's baseline she was awake alert talking walking prior to amputation.  She had a PEG tube placed at [place].  She got diagnosed with Covid in [facility] around January 30. He wants [place] to do everything possible for her to get her back to her baseline.  However he did agree to DNR/DNI. In the ED CT of the abdomen and pelvis shows possible acute cholecystitis and large gallbladder.  Normal LFTs and white count.  However her blood pressure was low at  85 in the ED.  T-max was 99 heart rate was 102.  Patient is on multiple immunosuppressants including cyclosporine Imuran and prednisone.  Assessment & Plan:   Principal Problem:   Acute metabolic encephalopathy Active Problems:   S/p cadaver renal transplant   Sacral decubitus ulcer, stage IV (HCC)   Type 1 diabetes mellitus with hyperlipidemia (HCC)   Unspecified severe protein-calorie malnutrition (HCC)   Acute lower UTI   Anemia   # 1 Acute Toxic metabolic encephalopathy-this is thought to be secondary to multifactorial etiologies including Pseudomonas UTI  SENSITIVITIES PENDING, SEVERE ANEMIA  stage IV sacral decubitus, possible acute cholecystitis .Per husband her baseline mental status is she is awake and alert and answer questions appropriately. Since her amputation of the right lower extremity in October 2021 she has been deteriorating. She has been in and out of hospitals and nursing home since then. CT of the abdomen and pelvis this admission does not reveal any evidence of osteomyelitis in the sacral region. continue Zosyn and narrow antibiotics depending on sensitivities. She is more awake today.  Very hard of hearing.  #2 SIRS patient met SIRS criteria on admission with hypotension systolic blood pressure less than 90 with tachycardia with a heart rate above 100.  Lactic level was normal white count was normal though she is immunosuppressed from chronic prednisone Imuran and cyclosporine.  #3 anemia of chronic disease hemoglobin 6.9 she got 2 units of blood transfusion hemoglobin up to 10.  FOBT NEGATIVE.  #4Stage 4 sacral decubitus and stage II left heel-POA.  Seen by wound care continue their recommendations.  #5?ACUTE cholecystitis by CT scan.  Ultrasound shows distended gallbladder containing stones with trace pericholecystic fluid but normal wall thickness.  Recommending a HIDA scan.  Common bile duct diameter upper normal.  Normal wbc and normal Lfts   #6 s/p renal  transplant -on imuran solumedrol and cyclosporine  #7 hypoalbumenemia dietary consulted restart tube feedings.  #8 hypothyroidsm continue Synthroid.  #9type 1 DM hemoglobin A1c of 6.9 she is status post pancreatic transplant. Increase lantus to 6 units bid with novolog 3 units q 4.  CBG (last 3)  Recent Labs    02/28/21 0008 02/28/21 0352 02/28/21 0742  GLUCAP 303* 265* 176*     #10 R BKA site looks clean dry and intact.  # 11 CKD STAGE 3 A creatinine stable.  At baseline.  #13 urinary retention Foley was placed initially on January 21  #14 goals of care discussed in detail with patient's husband.  He makes decisions for his wife when she cannot.  He has changed her CODE STATUS to DNR/DNI.  However he wants to continue antibiotics and fluids and PEG tube feedings to see if she will return back to her baseline.  With multiple comorbidities and poor functional status poor nutritional status her long-term prognosis is extremely poor.  This was discussed with her husband [name].  #15 she was tested Covid positive on January 30 at [facility] she has recovered from [facility] and it has been more than 21 days will DC precautions.  Discussed with ID.  Pressure Injury 12/10/20 Heel Left;Posterior Stage 2 -  Partial thickness loss of dermis presenting as a shallow open injury with a red, pink wound bed without slough. (Active)  12/10/20 1325  Location: Heel  Location Orientation: Left;Posterior  Staging: Stage 2 -  Partial thickness loss of dermis presenting as a shallow open injury with a red, pink wound bed without slough.  Wound Description (Comments):   Present on Admission: Yes     Pressure Injury 12/24/20 Sacrum Stage 4 - Full thickness tissue loss with exposed bone, tendon or muscle. (Active)  12/24/20   Location: Sacrum  Location Orientation:   Staging: Stage 4 - Full thickness tissue loss with exposed bone, tendon or muscle.  Wound Description (Comments):   Present on  Admission: Yes    Estimated body mass index is 17.64 kg/m as calculated from the following:   Height as of this encounter: 5' 3.6" (1.615 m).   Weight as of this encounter: 46 kg.  DVT prophylaxis: Lovenox Code Status: DNR Family Communication: Discussed with husband [name] over the phone  disposition Plan:  Status is: Inpatient  Dispo: The patient is from: SNF              Anticipated d/c is to: home              Patient currently is not medically stable to d/c.   Difficult to place patient NO    Consultants: NONE  Procedures: NONE Antimicrobials: VANC ZOSYN  Subjective: Patient is resting in bed she is awake and alert.  Very hard of hearing in the right ear.  Husband reports this hearing loss has increased in the past few months.  She denies any pain in the ear.  Objective: Vitals:   02/27/21 1214 02/27/21 1253 02/27/21 2029 02/28/21 0406  BP: (!) 151/87 (!) 154/85 (!) 176/84 (!) 156/72  Pulse:  75 70 69 68  Resp: (!) _0 Temp: 98.2 F (36.8 C)  98 F (36.7 C) 98 F (36.7 C)  TempSrc: Oral  Oral Oral  SpO2: 99%  100% 97%  Weight:      Height:        Intake/Output Summary (Last 24 hours) at 02/28/2021 1032 Last data filed at 02/28/2021 2633 Gross per 24 hour  Intake 1267.01 ml  Output 3050 ml  Net -1782.99 ml   Filed Weights   02/26/21 0512  Weight: 46 kg    Examination: PEG tube and Foley catheter in place  General exam: Chronically ill-appearing not responding to questions or commands Respiratory system: Clear to auscultation. Respiratory effort normal. Cardiovascular system: S1 & S2 heard, RRR. No JVD, murmurs, rubs, gallops or clicks. No pedal edema. Gastrointestinal system: Abdomen is nondistended, soft and nontender. No organomegaly or masses felt. Normal bowel sounds heard.  PEG tube in place Central nervous system: Awake answering questions appropriately.   Extremities: Right BKA stump clean dry intact Skin: Sacral decubitus and left heel stage  II Psychiatry: Appropriate    Data Reviewed: I have personally reviewed following labs and imaging studies  CBC: Recent Labs  Lab 02/25/21 2000 02/26/21 1306 02/27/21 0341 02/28/21 0409  WBC 7.9 5.4 6.8 7.5  NEUTROABS 4.6  --   --   --   HGB 6.9* 11.2* 9.9* 10.0*  HCT 21.3* 34.6* 31.4* 31.4*  MCV 103.4* 97.5 99.1 98.7  PLT 403* 361 369 354   Basic Metabolic Panel: Recent Labs  Lab 02/25/21 2000 02/26/21 1306 02/26/21 1638 02/26/21 2051 02/27/21 0341 02/27/21 1612 02/28/21 0409  NA 135 140  --   --  134*  --  137  K 5.1 4.7  --   --  3.2*  --  3.0*  CL 104 108  --   --  104  --  103  CO2 22 20*  --   --  18*  --  23  GLUCOSE 183* 245*  --  570* 465*  --  282*  BUN 36* 35*  --   --  45*  --  37*  CREATININE 0.66 0.73  --   --  1.00  --  0.83  CALCIUM 9.3 9.4  --   --  9.2  --  9.0  MG  --  1.9 1.9  --  1.8 1.8  --   PHOS  --  4.2 4.3  --  3.4 2.9  --    GFR: Estimated Creatinine Clearance: 51.7 mL/min (by C-G formula based on SCr of 0.83 mg/dL). Liver Function Tests: Recent Labs  Lab 02/25/21 2000 02/26/21 1306 02/27/21 0341 02/28/21 0409  AST _1 ALT _2 ALKPHOS 106 110 93 80  BILITOT 0.6 0.9 0.9 0.5  PROT 5.6* 5.9* 5.5* 5.3*  ALBUMIN 2.1* 2.0* 2.0* 1.8*   No results for input(s): LIPASE, AMYLASE in the last 168 hours. No results for input(s): AMMONIA in the last 168 hours. Coagulation Profile: No results for input(s): INR, PROTIME in the last 168 hours. Cardiac Enzymes: No results for input(s): CKTOTAL, CKMB, CKMBINDEX, TROPONINI in the last 168 hours. BNP (last 3 results) No results for input(s): PROBNP in the last 8760 hours. HbA1C: Recent Labs    02/25/21 2000  HGBA1C 7.9*   CBG: Recent Labs  Lab 02/27/21 1644 02/27/21 2030 02/28/21 0008 02/28/21 0352 02/28/21 0742  GLUCAP 296* 272* 303* 265* 176*  Lipid Profile: No results for input(s): CHOL, HDL, LDLCALC, TRIG, CHOLHDL, LDLDIRECT in the last 72 hours. Thyroid  Function Tests: No results for input(s): TSH, T4TOTAL, FREET4, T3FREE, THYROIDAB in the last 72 hours. Anemia Panel: No results for input(s): VITAMINB12, FOLATE, FERRITIN, TIBC, IRON, RETICCTPCT in the last 72 hours. Sepsis Labs: Recent Labs  Lab 02/25/21 2000 02/25/21 2130  LATICACIDVEN 0.6 1.0    Recent Results (from the past 240 hour(s))  Urine culture     Status: Abnormal (Preliminary result)   Collection Time: 02/25/21  7:40 PM   Specimen: Urine, Clean Catch  Result Value Ref Range Status   Specimen Description   Final    URINE, CLEAN CATCH Performed at [facility], [place] [address] [place], [place] [address]    Special Requests   Final    NONE Performed at [facility], [place] [address] [place], [place] [address]    Culture (A)  Final    >=100,000 COLONIES/mL PSEUDOMONAS AERUGINOSA SUSCEPTIBILITIES TO FOLLOW Performed at [facility], [place] [address], [place], [place] [address]    Report Status PENDING  Incomplete  Culture, blood (routine x 2)     Status: None (Preliminary result)   Collection Time: 02/25/21  8:25 PM   Specimen: BLOOD LEFT WRIST  Result Value Ref Range Status   Specimen Description   Final    BLOOD LEFT WRIST Performed at [place] [address] [place], [place] [address]    Special Requests   Final    BOTTLES DRAWN AEROBIC AND ANAEROBIC Blood Culture adequate volume Performed at [place] [address] [place], [place] [address]    Culture   Final    NO GROWTH 2 DAYS Performed at [place] [address], [place], [place] [address]    Report Status PENDING  Incomplete  Culture, blood (routine x 2)     Status: None (Preliminary result)   Collection Time: 02/25/21  8:30 PM   Specimen: BLOOD  Result Value Ref Range Status   Specimen Description   Final    BLOOD LEFT ANTECUBITAL Performed at [place] [address] [place], [place] [address]    Special Requests   Final    BOTTLES DRAWN AEROBIC AND ANAEROBIC Blood Culture adequate volume Performed at [place] [address], [place], [place] [address]    Culture   Final    NO GROWTH 2 DAYS Performed at [place] [address] [place],  [address]    Report Status PENDING  Incomplete  Resp Panel by RT-PCR (Flu A&B, Covid) Nasopharyngeal Swab     Status: Abnormal   Collection Time: 02/25/21 11:45 PM   Specimen: Nasopharyngeal Swab; Nasopharyngeal(NP) swabs in vial transport medium  Result Value Ref Range Status   SARS Coronavirus 2 by RT PCR POSITIVE (A) NEGATIVE Final    Comment: RESULT CALLED TO, READ BACK BY AND VERIFIED WITH: SMITH J.03.03.22 @ 0124 BY MECIAL MECIAL J. (NOTE) SARS-CoV-2 target nucleic acids are DETECTED.  The SARS-CoV-2 RNA is generally detectable in upper respiratory specimens during the acute phase of infection. Positive results are indicative of the presence of the identified virus, but do not rule out bacterial infection or co-infection with other pathogens not detected by the test. Clinical correlation with patient history and other diagnostic information is necessary to determine patient infection status. The expected result is Negative.  Fact Sheet  for Patients: EntrepreneurPulse.com.au  Fact Sheet for Healthcare Providers: IncredibleEmployment.be  This test is not yet approved or cleared by the [place] FDA and  has been authorized for detection and/or diagnosis of SARS-CoV-2 by FDA under an Emergency Use Authorization (EUA).  This EUA will remain in effect (meaning this t est can be used) for the duration of  the COVID-19 declaration under Section 564(b)(1) of the Act, 21 U.S.C. section 360bbb-3(b)(1), unless the authorization is terminated or revoked sooner.     Influenza A by PCR NEGATIVE NEGATIVE Final   Influenza B by PCR  NEGATIVE NEGATIVE Final    Comment: (NOTE) The Xpert Xpress SARS-CoV-2/FLU/RSV plus assay is intended as an aid in the diagnosis of influenza from Nasopharyngeal swab specimens and should not be used as a sole basis for treatment. Nasal washings and aspirates are unacceptable for Xpert Xpress SARS-CoV-2/FLU/RSV testing.  Fact Sheet for Patients: EntrepreneurPulse.com.au  Fact Sheet for Healthcare Providers: IncredibleEmployment.be  This test is not yet approved or cleared by the [place] FDA and has been authorized for detection and/or diagnosis of SARS-CoV-2 by FDA under an Emergency Use Authorization (EUA). This EUA will remain in effect (meaning this test can be used) for the duration of the COVID-19 declaration under Section 564(b)(1) of the Act, 21 U.S.C. section 360bbb-3(b)(1), unless the authorization is terminated or revoked.  Performed at [facility], [place] [address] [place], [place] [address]   MRSA PCR Screening     Status: None   Collection Time: 02/28/21 12:28 AM   Specimen: Nasal Mucosa; Nasopharyngeal  Result Value Ref Range Status   MRSA by PCR NEGATIVE NEGATIVE Final    Comment:        The GeneXpert MRSA Assay (FDA approved for NASAL specimens only), is one component of a comprehensive MRSA colonization surveillance program. It is not intended to diagnose MRSA infection nor to guide or monitor treatment for MRSA infections. Performed at [facility], [place] [address], [place], [place] [address]          Radiology Studies: No results found.      Scheduled Meds: . aspirin EC  81 mg Oral Daily  . atorvastatin  10 mg Oral Daily  . azaTHIOprine  50 mg Oral Daily  . B-complex with vitamin C  1 tablet Oral Daily  . Chlorhexidine Gluconate Cloth  6 each Topical Daily  . cholestyramine light  4 g Oral Q12H  . collagenase   Topical Daily  . cycloSPORINE  75 mg Oral BID   . enoxaparin (LOVENOX) injection  30 mg Subcutaneous Q24H  . free water  150 mL Per Tube Q4H  . insulin aspart  0-15 Units Subcutaneous Q4H  . insulin aspart  3 Units Subcutaneous TID WC  . insulin glargine  4 Units Subcutaneous BID  . levothyroxine  88 mcg Oral QAC breakfast  . pantoprazole  40 mg Oral Daily  . predniSONE  5 mg Oral Q breakfast  . sertraline  25 mg Oral Daily  . sodium bicarbonate  650 mg Oral BID   Continuous Infusions: . feeding supplement (GLUCERNA 1.5 CAL) 1,000 mL (02/28/21 0617)  . piperacillin-tazobactam (ZOSYN)  IV 3.375 g (02/28/21 0517)     LOS: 3 days   Georgette Shell, MD 02/28/2021, 10:32 AM

## 2021-02-28 NOTE — Plan of Care (Signed)
  Problem: Education: Goal: Knowledge of General Education information will improve Description: Including pain rating scale, medication(s)/side effects and non-pharmacologic comfort measures Outcome: Progressing   Problem: Health Behavior/Discharge Planning: Goal: Ability to manage health-related needs will improve Outcome: Progressing   Problem: Clinical Measurements: Goal: Ability to maintain clinical measurements within normal limits will improve Outcome: Progressing Goal: Will remain free from infection Outcome: Progressing Goal: Diagnostic test results will improve Outcome: Progressing Goal: Respiratory complications will improve Outcome: Progressing Goal: Cardiovascular complication will be avoided Outcome: Progressing   Problem: Activity: Goal: Risk for activity intolerance will decrease Outcome: Progressing   Problem: Nutrition: Goal: Adequate nutrition will be maintained Outcome: Progressing   Problem: Coping: Goal: Level of anxiety will decrease Outcome: Progressing   Problem: Elimination: Goal: Will not experience complications related to bowel motility Outcome: Progressing Goal: Will not experience complications related to urinary retention Outcome: Progressing   Problem: Pain Managment: Goal: General experience of comfort will improve Outcome: Progressing   Problem: Safety: Goal: Ability to remain free from injury will improve Outcome: Progressing   Problem: Skin Integrity: Goal: Risk for impaired skin integrity will decrease Outcome: Progressing   Problem: Education: Goal: Ability to describe self-care measures that may prevent or decrease complications (Diabetes Survival Skills Education) will improve Outcome: Progressing Goal: Individualized Educational Video(s) Outcome: Progressing   Problem: Coping: Goal: Ability to adjust to condition or change in health will improve Outcome: Progressing   Problem: Fluid Volume: Goal: Ability to  maintain a balanced intake and output will improve Outcome: Progressing   Problem: Health Behavior/Discharge Planning: Goal: Ability to identify and utilize available resources and services will improve Outcome: Progressing Goal: Ability to manage health-related needs will improve Outcome: Progressing   Problem: Metabolic: Goal: Ability to maintain appropriate glucose levels will improve Outcome: Progressing   Problem: Nutritional: Goal: Maintenance of adequate nutrition will improve Outcome: Progressing Goal: Progress toward achieving an optimal weight will improve Outcome: Progressing   Problem: Skin Integrity: Goal: Risk for impaired skin integrity will decrease Outcome: Progressing   Problem: Tissue Perfusion: Goal: Adequacy of tissue perfusion will improve Outcome: Progressing   Problem: Education: Goal: Knowledge of risk factors and measures for prevention of condition will improve Outcome: Progressing   Problem: Coping: Goal: Psychosocial and spiritual needs will be supported Outcome: Progressing   Problem: Respiratory: Goal: Will maintain a patent airway Outcome: Progressing Goal: Complications related to the disease process, condition or treatment will be avoided or minimized Outcome: Progressing   

## 2021-03-01 DIAGNOSIS — G9341 Metabolic encephalopathy: Secondary | ICD-10-CM | POA: Diagnosis not present

## 2021-03-01 LAB — CBC
HCT: 34.8 % — ABNORMAL LOW (ref 36.0–46.0)
Hemoglobin: 10.7 g/dL — ABNORMAL LOW (ref 12.0–15.0)
MCH: 31.3 pg (ref 26.0–34.0)
MCHC: 30.7 g/dL (ref 30.0–36.0)
MCV: 101.8 fL — ABNORMAL HIGH (ref 80.0–100.0)
Platelets: 385 10*3/uL (ref 150–400)
RBC: 3.42 MIL/uL — ABNORMAL LOW (ref 3.87–5.11)
RDW: 20.4 % — ABNORMAL HIGH (ref 11.5–15.5)
WBC: 5.8 10*3/uL (ref 4.0–10.5)
nRBC: 0 % (ref 0.0–0.2)

## 2021-03-01 LAB — COMPREHENSIVE METABOLIC PANEL
ALT: 14 U/L (ref 0–44)
AST: 21 U/L (ref 15–41)
Albumin: 2 g/dL — ABNORMAL LOW (ref 3.5–5.0)
Alkaline Phosphatase: 74 U/L (ref 38–126)
Anion gap: 8 (ref 5–15)
BUN: 46 mg/dL — ABNORMAL HIGH (ref 8–23)
CO2: 22 mmol/L (ref 22–32)
Calcium: 9 mg/dL (ref 8.9–10.3)
Chloride: 107 mmol/L (ref 98–111)
Creatinine, Ser: 0.96 mg/dL (ref 0.44–1.00)
GFR, Estimated: >60 mL/min (ref >60)
Glucose, Bld: 201 mg/dL — ABNORMAL HIGH (ref 70–99)
Potassium: 5.4 mmol/L — ABNORMAL HIGH (ref 3.5–5.1)
Sodium: 137 mmol/L (ref 135–145)
Total Bilirubin: 0.5 mg/dL (ref 0.3–1.2)
Total Protein: 5.4 g/dL — ABNORMAL LOW (ref 6.5–8.1)

## 2021-03-01 LAB — GLUCOSE, CAPILLARY
Glucose-Capillary: 134 mg/dL — ABNORMAL HIGH (ref 70–99)
Glucose-Capillary: 183 mg/dL — ABNORMAL HIGH (ref 70–99)
Glucose-Capillary: 185 mg/dL — ABNORMAL HIGH (ref 70–99)
Glucose-Capillary: 197 mg/dL — ABNORMAL HIGH (ref 70–99)
Glucose-Capillary: 202 mg/dL — ABNORMAL HIGH (ref 70–99)
Glucose-Capillary: 241 mg/dL — ABNORMAL HIGH (ref 70–99)
Glucose-Capillary: 99 mg/dL (ref 70–99)

## 2021-03-01 MED ORDER — SACCHAROMYCES BOULARDII 250 MG PO CAPS
250.0000 mg | ORAL_CAPSULE | Freq: Two times a day (BID) | ORAL | Status: DC
  Administered 2021-03-01 – 2021-03-03 (×5): 250 mg via ORAL
  Filled 2021-03-01 (×5): qty 1

## 2021-03-01 MED ORDER — CIPROFLOXACIN HCL 500 MG PO TABS
500.0000 mg | ORAL_TABLET | Freq: Two times a day (BID) | ORAL | Status: DC
  Administered 2021-03-01 – 2021-03-03 (×4): 500 mg via ORAL
  Filled 2021-03-01 (×5): qty 1

## 2021-03-01 MED ORDER — INSULIN GLARGINE 100 UNIT/ML ~~LOC~~ SOLN
6.0000 [IU] | Freq: Two times a day (BID) | SUBCUTANEOUS | Status: DC
  Administered 2021-03-01 – 2021-03-02 (×4): 6 [IU] via SUBCUTANEOUS
  Filled 2021-03-01 (×5): qty 0.06

## 2021-03-01 NOTE — TOC Initial Note (Signed)
Transition of Care [facility]) - Initial/Assessment Note    Patient Details  Name: [name] MRN: [id number] Date of Birth: June 30, 1961  Transition of [facility] CM/SW Contact:    Joaquin Courts, RN Phone Number: 03/01/2021, 12:15 PM  Clinical Narrative:                 CM spoke with spouse who reports he would like to take patient home at discharge.  Patient has been to several snf facilities and spouse feels return home is best.  Spouse reports patient was supposed to start [facility] services but was re-hospitalized before services started.  CM spoke with [facility] rep Sharmon Revere and requested previously arranged HHPT/OT/RN restart after dc.  MD notified of need for [facility] orders.  Patient will need PTAR transport home, spouse has hospital bed and wheelchair at home.  Expected Discharge Plan: [place] Barriers to Discharge: Continued Medical Work up   Patient Goals and CMS Choice Patient states their goals for this hospitalization and ongoing recovery are:: to go back home CMS Medicare.gov Compare Post Acute Care list provided to:: Patient Represenative (must comment) Choice offered to / list presented to : Spouse  Expected Discharge Plan and Services Expected Discharge Plan: [place]   Discharge Planning Services: CM Consult Post Acute Care Choice: [place] arrangements for the past 2 months: [place]                 DME Arranged: N/A         HH Arranged: PT,OT,RN [place]: [place] Date [place]: 03/01/21 Time [place]: 1214 Representative spoke with at [place]: Sharmon Revere  Prior Living Arrangements/Services Living arrangements for the past 2 months: [place]   Patient language and need for interpreter reviewed:: Yes Do you feel safe going back to the place where you live?: Yes      Need for Family Participation in Patient Care: Yes (Comment) Care  giver support system in place?: Yes (comment)   Criminal Activity/Legal Involvement Pertinent to Current Situation/Hospitalization: No - Comment as needed  Activities of Daily Living Home Assistive Devices/Equipment: None ADL Screening (condition at time of admission) Patient's cognitive ability adequate to safely complete daily activities?: No Is the patient deaf or have difficulty hearing?: Yes Does the patient have difficulty seeing, even when wearing glasses/contacts?: No Does the patient have difficulty concentrating, remembering, or making decisions?: Yes Patient able to express need for assistance with ADLs?: No Does the patient have difficulty dressing or bathing?: Yes Independently performs ADLs?: No Communication: Independent Dressing (OT): Needs assistance Is this a change from baseline?: Pre-admission baseline Grooming: Needs assistance Is this a change from baseline?: Pre-admission baseline Feeding: Needs assistance Is this a change from baseline?: Pre-admission baseline Bathing: Needs assistance Is this a change from baseline?: Pre-admission baseline Toileting: Needs assistance Is this a change from baseline?: Pre-admission baseline In/Out Bed: Needs assistance Is this a change from baseline?: Pre-admission baseline Walks in Home: Needs assistance Is this a change from baseline?: Pre-admission baseline Does the patient have difficulty walking or climbing stairs?: Yes Weakness of Legs: Both Weakness of Arms/Hands: Both  Permission Sought/Granted                  Emotional Assessment           Psych Involvement: No (comment)  Admission diagnosis:  Acute cholecystitis [K81.0] Acute cystitis without hematuria [N30.00] Hypotensive  episode [I95.9] Severe anemia [D64.9] Altered mental status, unspecified altered mental status type [id number] Acute metabolic encephalopathy [id number] Patient Active Problem List   Diagnosis Date Noted  . Acute lower UTI  02/25/2021  . Anemia 02/25/2021  . Goals of care, counseling/discussion   . Palliative care by specialist   . SIRS (systemic inflammatory response syndrome) ([place]) 01/18/2021  . Malnutrition of moderate degree 01/07/2021  . Acute osteomyelitis of sacrum ([place])   . Failure to thrive in adult   . Unspecified severe protein-calorie malnutrition ([place]) 12/10/2020  . Acute metabolic encephalopathy 99991111  . Sacral decubitus ulcer, stage IV ([place]) 12/08/2020  . Cellulitis 12/08/2020  . Hypotension 12/08/2020  . Malodorous urine 12/08/2020  . Erosive esophagitis 12/08/2020  . Hypothyroid 12/08/2020  . Depression 12/08/2020  . Type 1 diabetes mellitus with hyperlipidemia ([place]) 12/08/2020  . Sepsis ([place]) 12/07/2020  . Hypoalbuminemia 12/07/2020  . Hyponatremia 12/07/2020  . CAP (community acquired pneumonia) 12/07/2020  . Acute urinary retention 12/07/2020  . Coffee ground emesis   . Gastroesophageal reflux disease with esophagitis and hemorrhage   . Long-term use of immunosuppressant medication   . GI bleed 11/08/2020  . AKI (acute kidney injury) ([place]) 10/29/2020  . Gangrene of right foot ([place])   . Subacute osteomyelitis of right foot ([place])   . Type 1 diabetes mellitus with complication, with long term current use of insulin pump ([place]) 09/27/2020  . Pressure ulcer of BKA stump ([place]) 09/27/2020  . Diabetic ketoacidosis ([place]) 09/26/2020  . Acute kidney injury superimposed on CKD ([place]) 09/26/2020  . Cellulitis and abscess of right leg 09/26/2020  . 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 S/p cadaver renal transplant 05/14/2020  . Chronic kidney disease (CKD) stage G3a/A1, moderately decreased glomerular filtration rate (GFR) between 45-59 mL/min/1.73 square meter and albuminuria creatinine ratio less than 30 mg/g (HCC) 05/14/2020  . Hypertension 05/14/2020   PCP:  [name], MD Pharmacy:   [id number] [place] [id number] [id number]: [phone]: [phone]    Social Determinants of Health (SDOH) Interventions    Readmission Risk Interventions Readmission Risk Prevention Plan 01/23/2021  Transportation Screening Complete  Medication Review (Press photographer Complete  SW Recovery Care/Counseling Consult Complete  Palliative Care Screening Complete  Skilled Nursing Facility Complete

## 2021-03-01 NOTE — Progress Notes (Signed)
PROGRESS NOTE    [name]  JXB:[id number] DOB: 1961-01-02 DOA: 02/25/2021 PCP: [name], MD   Brief Narrative: 60 yo female DM TYPE I FTT HTN R BKA RENAL TRANSPLANT on immunosuppressants.  History obtained from patient's husband and ER records.  Multiple hospital admissions in the last 6 months reviewed in the chart. Per husband patient had a right foot amputation in October 2021, she was discharged to a [facility] for 3 weeks where she developed an AKI from dehydration not eating and drinking and she was sent to [facility] for 2 weeks AKI resolved.  She was sent to [facility] where she started to have nausea vomiting and diarrhea per husband she had food poisoning and was sent to [facility] for 1 week and was sent out to [facility] again for 3 weeks approximately she had an infected bedsore she was again sent back to [facility] and then discharged to [place] [facility] for 1 week she was again admitted to [facility] for dehydration/sepsis and sacral infected decubitus with osteomyelitis PICC line placed IV antibiotics given.  Then discharged to [facility] from where she was discharged to [facility] where she was treated with antibiotics for questionable source of infection and then she was discharged to home.  Per husband he was going to take care of her at home with home health.  However patient was more in the state of unresponsiveness and he was not able to care for her. Per husband patient's baseline she was awake alert talking walking prior to amputation.  She had a PEG tube placed at [place].  She got diagnosed with Covid in [facility] around January 30. He wants [place] to do everything possible for her to get her back to her baseline.  However he did agree to DNR/DNI. In the ED CT of the abdomen and pelvis shows possible acute cholecystitis and large gallbladder.  Normal LFTs and white count.  However her blood pressure was low at  85 in the ED.  T-max was 99 heart rate was 102.  Patient is on multiple immunosuppressants including cyclosporine Imuran and prednisone.  Assessment & Plan:   Principal Problem:   Acute metabolic encephalopathy Active Problems:   S/p cadaver renal transplant   Sacral decubitus ulcer, stage IV (HCC)   Type 1 diabetes mellitus with hyperlipidemia (HCC)   Unspecified severe protein-calorie malnutrition (HCC)   Acute lower UTI   Anemia   # 1 Acute Toxic metabolic encephalopathy-this is thought to be secondary to multifactorial etiologies including Pseudomonas UTI  SENSITIVIve to cipro, SEVERE ANEMIA  stage IV sacral decubitus, possible acute cholecystitis .Per husband her baseline mental status is she is awake and alert and answer questions appropriately. Since her amputation of the right lower extremity in October 2021 she has been deteriorating. She has been in and out of hospitals and nursing home since then. CT of the abdomen and pelvis this admission does not reveal any evidence of osteomyelitis in the sacral region. DC Zosyn and start ciprofloxacin.  #2 SIRS patient met SIRS criteria on admission with hypotension systolic blood pressure less than 90 with tachycardia with a heart rate above 100.  Lactic level was normal white count was normal though she is immunosuppressed from chronic prednisone Imuran and cyclosporine.  #3 anemia of chronic disease hemoglobin 6.9 she got 2 units of blood transfusion hemoglobin up to 10.   FOBT NEGATIVE.  Hemoglobin has been stable.  #4Stage 4 sacral decubitus  and stage II left heel-POA.  Seen by wound care continue their recommendations.  #5?ACUTE cholecystitis by CT scan.  Ultrasound shows distended gallbladder containing stones with trace pericholecystic fluid but normal wall thickness.  Recommending a HIDA scan.  Common bile duct diameter upper normal.  Normal wbc and normal Lfts   #6 s/p renal transplant -on imuran solumedrol and  cyclosporine  #7 hypoalbumenemia dietary consulted restart tube feedings.  #8 hypothyroidsm continue Synthroid.  #9type 1 DM hemoglobin A1c of 6.9 she is status post pancreatic transplant. Increased lantus to 6 units bid with novolog 3 units q 4.  CBG (last 3)  Recent Labs    02/28/21 2353 03/01/21 0418 03/01/21 0745  GLUCAP 247* 197* 241*     #10 R BKA site looks clean dry and intact.  # 11 CKD STAGE 3 A creatinine stable.  At baseline.  #13 urinary retention Foley was placed initially on January 21  #14 goals of care discussed in detail with patient's husband.  He makes decisions for his wife when she cannot.  He has changed her CODE STATUS to DNR/DNI.  However he wants to continue antibiotics and fluids and PEG tube feedings to see if she will return back to her baseline.  With multiple comorbidities and poor functional status poor nutritional status her long-term prognosis is extremely poor.  This was discussed with her husband [name].  #15 she was tested Covid positive on January 30 at [facility] she has recovered from [facility] and it has been more than 21 days will DC precautions.  Discussed with ID.  Pressure Injury 12/10/20 Heel Left;Posterior Stage 2 -  Partial thickness loss of dermis presenting as a shallow open injury with a red, pink wound bed without slough. (Active)  12/10/20 1325  Location: Heel  Location Orientation: Left;Posterior  Staging: Stage 2 -  Partial thickness loss of dermis presenting as a shallow open injury with a red, pink wound bed without slough.  Wound Description (Comments):   Present on Admission: Yes     Pressure Injury 12/24/20 Sacrum Stage 4 - Full thickness tissue loss with exposed bone, tendon or muscle. (Active)  12/24/20   Location: Sacrum  Location Orientation:   Staging: Stage 4 - Full thickness tissue loss with exposed bone, tendon or muscle.  Wound Description (Comments):   Present on Admission: Yes     Pressure Injury  02/26/21 Ischial tuberosity Right;Lower Stage 2 -  Partial thickness loss of dermis presenting as a shallow open injury with a red, pink wound bed without slough. (Active)  02/26/21 0600  Location: Ischial tuberosity  Location Orientation: Right;Lower  Staging: Stage 2 -  Partial thickness loss of dermis presenting as a shallow open injury with a red, pink wound bed without slough.  Wound Description (Comments):   Present on Admission:     Estimated body mass index is 18.09 kg/m as calculated from the following:   Height as of this encounter: 5' 3.6" (1.615 m).   Weight as of this encounter: 47.2 kg.  DVT prophylaxis: Lovenox Code Status: DNR Family Communication: Discussed with husband [name] over the phone  disposition Plan:  Status is: Inpatient  Dispo: The patient is from: SNF              Anticipated d/c is to: home              Patient currently is not medically stable to d/c.  Plan on discharge 03/02/2021 with home health.   Difficult to  place patient NO    Consultants: NONE  Procedures: NONE Antimicrobials: zosyn/cipro  Subjective: Patient is resting in bed she is awake.  She is able to answer some questions appropriately and follows some commands.  Complains of increased hearing loss for the last few months.  Nurse cleaned out her ear yesterday with minimal improvement. Objective: Vitals:   02/28/21 0406 02/28/21 1520 02/28/21 1955 03/01/21 0416  BP: (!) 156/72 (!) 147/75 (!) 155/83 130/65  Pulse: 68 75 69 73  Resp: '16 18 20 18  ' Temp: 98 F (36.7 C) 97.7 F (36.5 C) 98.3 F (36.8 C) 98.4 F (36.9 C)  TempSrc: Oral Oral  Oral  SpO2: 97% 100% 97% 99%  Weight:    47.2 kg  Height:        Intake/Output Summary (Last 24 hours) at 03/01/2021 0905 Last data filed at 03/01/2021 0424 Gross per 24 hour  Intake 1092.8 ml  Output 1175 ml  Net -82.2 ml   Filed Weights   02/26/21 0512 03/01/21 0416  Weight: 46 kg 47.2 kg    Examination: PEG tube and Foley catheter in  place  General exam: Chronically ill-appearing  Respiratory system: Clear to auscultation. Respiratory effort normal. Cardiovascular system: S1 & S2 heard, RRR. No JVD, murmurs, rubs, gallops or clicks. No pedal edema. Gastrointestinal system: Abdomen is nondistended, soft and nontender. No organomegaly or masses felt. Normal bowel sounds heard.  PEG tube in place Central nervous system: Awake answering questions appropriately.   Extremities: Right BKA stump clean dry intact Skin: Sacral decubitus and left heel stage II Psychiatry: Appropriate    Data Reviewed: I have personally reviewed following labs and imaging studies  CBC: Recent Labs  Lab 02/25/21 2000 02/26/21 1306 02/27/21 0341 02/28/21 0409 03/01/21 0351  WBC 7.9 5.4 6.8 7.5 5.8  NEUTROABS 4.6  --   --   --   --   HGB 6.9* 11.2* 9.9* 10.0* 10.7*  HCT 21.3* 34.6* 31.4* 31.4* 34.8*  MCV 103.4* 97.5 99.1 98.7 101.8*  PLT 403* 361 369 388 916   Basic Metabolic Panel: Recent Labs  Lab 02/25/21 2000 02/26/21 1306 02/26/21 1638 02/26/21 2051 02/27/21 0341 02/27/21 1612 02/28/21 0409 03/01/21 0351  NA 135 140  --   --  134*  --  137 137  K 5.1 4.7  --   --  3.2*  --  3.0* 5.4*  CL 104 108  --   --  104  --  103 107  CO2 22 20*  --   --  18*  --  23 22  GLUCOSE 183* 245*  --  570* 465*  --  282* 201*  BUN 36* 35*  --   --  45*  --  37* 46*  CREATININE 0.66 0.73  --   --  1.00  --  0.83 0.96  CALCIUM 9.3 9.4  --   --  9.2  --  9.0 9.0  MG  --  1.9 1.9  --  1.8 1.8 1.8  --   PHOS  --  4.2 4.3  --  3.4 2.9  --   --    GFR: Estimated Creatinine Clearance: 45.9 mL/min (by C-G formula based on SCr of 0.96 mg/dL). Liver Function Tests: Recent Labs  Lab 02/25/21 2000 02/26/21 1306 02/27/21 0341 02/28/21 0409 03/01/21 0351  AST '16 18 25 20 21  ' ALT '12 12 13 12 14  ' ALKPHOS 106 110 93 80 74  BILITOT 0.6 0.9 0.9 0.5 0.5  PROT 5.6* 5.9* 5.5* 5.3* 5.4*  ALBUMIN 2.1* 2.0* 2.0* 1.8* 2.0*   No results for input(s):  LIPASE, AMYLASE in the last 168 hours. No results for input(s): AMMONIA in the last 168 hours. Coagulation Profile: No results for input(s): INR, PROTIME in the last 168 hours. Cardiac Enzymes: No results for input(s): CKTOTAL, CKMB, CKMBINDEX, TROPONINI in the last 168 hours. BNP (last 3 results) No results for input(s): PROBNP in the last 8760 hours. HbA1C: No results for input(s): HGBA1C in the last 72 hours. CBG: Recent Labs  Lab 02/28/21 1732 02/28/21 1952 02/28/21 2353 03/01/21 0418 03/01/21 0745  GLUCAP 94 179* 247* 197* 241*   Lipid Profile: No results for input(s): CHOL, HDL, LDLCALC, TRIG, CHOLHDL, LDLDIRECT in the last 72 hours. Thyroid Function Tests: No results for input(s): TSH, T4TOTAL, FREET4, T3FREE, THYROIDAB in the last 72 hours. Anemia Panel: No results for input(s): VITAMINB12, FOLATE, FERRITIN, TIBC, IRON, RETICCTPCT in the last 72 hours. Sepsis Labs: Recent Labs  Lab 02/25/21 2000 02/25/21 2130  LATICACIDVEN 0.6 1.0    Recent Results (from the past 240 hour(s))  Urine culture     Status: Abnormal   Collection Time: 02/25/21  7:40 PM   Specimen: Urine, Clean Catch  Result Value Ref Range Status   Specimen Description   Final    URINE, CLEAN CATCH Performed at [facility], [place] [address], [place], [place] [address]    Special Requests   Final    NONE Performed at [facility], [place] [address], [place], [place] [address]    Culture >=100,000 COLONIES/mL PSEUDOMONAS AERUGINOSA (A)  Final   Report Status 02/28/2021 FINAL  Final   Organism ID, Bacteria PSEUDOMONAS AERUGINOSA (A)  Final      Susceptibility   Pseudomonas aeruginosa - MIC*    CEFTAZIDIME >=64 RESISTANT Resistant     CIPROFLOXACIN 0.5 SENSITIVE Sensitive     GENTAMICIN <=1 SENSITIVE Sensitive     IMIPENEM 2 SENSITIVE Sensitive     * >=100,000 COLONIES/mL PSEUDOMONAS AERUGINOSA  Culture, blood (routine x 2)     Status: None (Preliminary result)    Collection Time: 02/25/21  8:25 PM   Specimen: BLOOD LEFT WRIST  Result Value Ref Range Status   Specimen Description   Final    BLOOD LEFT WRIST Performed at [facility] [address] [place], [place] [address]    Special Requests   Final    BOTTLES DRAWN AEROBIC AND ANAEROBIC Blood Culture adequate volume Performed at [facility] [address], [place], [place] [address]    Culture   Final    NO GROWTH 2 DAYS Performed at [facility] [address] [place], [place] [address]    Report Status PENDING  Incomplete  Culture, blood (routine x 2)     Status: None (Preliminary result)   Collection Time: 02/25/21  8:30 PM   Specimen: BLOOD  Result Value Ref Range Status   Specimen Description   Final    BLOOD LEFT ANTECUBITAL Performed at [facility] [address] [place], [place] [address]    Special Requests   Final    BOTTLES DRAWN AEROBIC AND ANAEROBIC Blood Culture adequate volume Performed at [facility] [address] [place], [place] [address]    Culture   Final    NO GROWTH 2 DAYS Performed at [facility] [address] [place], [place] [address]  Report Status PENDING  Incomplete  Resp Panel by RT-PCR (Flu A&B, Covid) Nasopharyngeal Swab     Status: Abnormal   Collection Time: 02/25/21 11:45 PM   Specimen: Nasopharyngeal Swab; Nasopharyngeal(NP) swabs in vial transport medium  Result Value Ref Range Status   SARS Coronavirus 2 by RT PCR POSITIVE (A) NEGATIVE Final    Comment: RESULT CALLED TO, READ BACK BY AND VERIFIED WITH: SMITH J.03.03.22 @ 0124 BY MECIAL MECIAL J. (NOTE) SARS-CoV-2 target nucleic acids are DETECTED.  The SARS-CoV-2 RNA is generally detectable in upper respiratory specimens during the acute phase of infection. Positive results are indicative of the presence of the identified virus, but do not rule out bacterial infection or co-infection  with other pathogens not detected by the test. Clinical correlation with patient history and other diagnostic information is necessary to determine patient infection status. The expected result is Negative.  Fact Sheet for Patients: EntrepreneurPulse.com.au  Fact Sheet for Healthcare Providers: IncredibleEmployment.be  This test is not yet approved or cleared by the [place] FDA and  has been authorized for detection and/or diagnosis of SARS-CoV-2 by FDA under an Emergency Use Authorization (EUA).  This EUA will remain in effect (meaning this t est can be used) for the duration of  the COVID-19 declaration under Section 564(b)(1) of the Act, 21 U.S.C. section 360bbb-3(b)(1), unless the authorization is terminated or revoked sooner.     Influenza A by PCR NEGATIVE NEGATIVE Final   Influenza B by PCR NEGATIVE NEGATIVE Final    Comment: (NOTE) The Xpert Xpress SARS-CoV-2/FLU/RSV plus assay is intended as an aid in the diagnosis of influenza from Nasopharyngeal swab specimens and should not be used as a sole basis for treatment. Nasal washings and aspirates are unacceptable for Xpert Xpress SARS-CoV-2/FLU/RSV testing.  Fact Sheet for Patients: EntrepreneurPulse.com.au  Fact Sheet for Healthcare Providers: IncredibleEmployment.be  This test is not yet approved or cleared by the [place] FDA and has been authorized for detection and/or diagnosis of SARS-CoV-2 by FDA under an Emergency Use Authorization (EUA). This EUA will remain in effect (meaning this test can be used) for the duration of the COVID-19 declaration under Section 564(b)(1) of the Act, 21 U.S.C. section 360bbb-3(b)(1), unless the authorization is terminated or revoked.  Performed at [name], [place] [address] [place], [place] [address]   MRSA PCR Screening     Status: None   Collection Time: 02/28/21 12:28 AM    Specimen: Nasal Mucosa; Nasopharyngeal  Result Value Ref Range Status   MRSA by PCR NEGATIVE NEGATIVE Final    Comment:        The GeneXpert MRSA Assay (FDA approved for NASAL specimens only), is one component of a comprehensive MRSA colonization surveillance program. It is not intended to diagnose MRSA infection nor to guide or monitor treatment for MRSA infections. Performed at [facility], [place] [address] [place], [place] [address]          Radiology Studies: No results found.      Scheduled Meds: . aspirin EC  81 mg Oral Daily  . atorvastatin  10 mg Oral Daily  . azaTHIOprine  50 mg Oral Daily  . B-complex with vitamin C  1 tablet Oral Daily  . carbamide peroxide  5 drop Right EAR BID  . Chlorhexidine Gluconate Cloth  6 each Topical Daily  . cholestyramine light  4 g Oral Q12H  . collagenase   Topical Daily  . cycloSPORINE  75 mg Oral BID  .  enoxaparin (LOVENOX) injection  30 mg Subcutaneous Q24H  . free water  150 mL Per Tube Q4H  . insulin aspart  0-15 Units Subcutaneous Q4H  . insulin aspart  3 Units Subcutaneous TID WC  . insulin glargine  4 Units Subcutaneous BID  . levothyroxine  88 mcg Oral QAC breakfast  . pantoprazole  40 mg Oral Daily  . predniSONE  5 mg Oral Q breakfast  . sertraline  25 mg Oral Daily  . sodium bicarbonate  650 mg Oral BID   Continuous Infusions: . feeding supplement (GLUCERNA 1.5 CAL) 1,000 mL (03/01/21 0145)  . piperacillin-tazobactam (ZOSYN)  IV 3.375 g (03/01/21 0458)     LOS: 4 days   Georgette Shell, MD 03/01/2021, 9:05 AM

## 2021-03-01 NOTE — Progress Notes (Signed)
Patient has a very hard time hearing, but when questions were written down a piece of paper in a large font, the patient was able to accurately answer questions this shift.   Wynona Neat, RN

## 2021-03-01 NOTE — Progress Notes (Signed)
Patient refused tube feeding and demanded that it be stopped and taken out or that she would pull it out herself. Patient adamant that she can eat on her own and does not need it. Patient's spouse at bedside and agreed with his wife, that it is unnecessary and that she was coerced into accepting the tube at another facility. I stopped the feeding, clamped the tube, and explained to the patient that I could not remove the tube, but that I would alert the on-call provider, place a note in her chart, and suggested that she discuss the removal of the tube with the provider in the morning. Patient and husband were agreeable with the suggestion as long as the feeding was stopped tonight. I notified the CN of the situation and [place] the on-call provider.  Wynona Neat, RN

## 2021-03-02 ENCOUNTER — Other Ambulatory Visit: Payer: Self-pay | Admitting: Radiology

## 2021-03-02 DIAGNOSIS — G9341 Metabolic encephalopathy: Secondary | ICD-10-CM | POA: Diagnosis not present

## 2021-03-02 DIAGNOSIS — R1310 Dysphagia, unspecified: Secondary | ICD-10-CM

## 2021-03-02 LAB — GLUCOSE, CAPILLARY
Glucose-Capillary: 112 mg/dL — ABNORMAL HIGH (ref 70–99)
Glucose-Capillary: 119 mg/dL — ABNORMAL HIGH (ref 70–99)
Glucose-Capillary: 149 mg/dL — ABNORMAL HIGH (ref 70–99)
Glucose-Capillary: 220 mg/dL — ABNORMAL HIGH (ref 70–99)
Glucose-Capillary: 241 mg/dL — ABNORMAL HIGH (ref 70–99)
Glucose-Capillary: 58 mg/dL — ABNORMAL LOW (ref 70–99)
Glucose-Capillary: 64 mg/dL — ABNORMAL LOW (ref 70–99)

## 2021-03-02 LAB — CBC
HCT: 32.6 % — ABNORMAL LOW (ref 36.0–46.0)
Hemoglobin: 10.2 g/dL — ABNORMAL LOW (ref 12.0–15.0)
MCH: 31.7 pg (ref 26.0–34.0)
MCHC: 31.3 g/dL (ref 30.0–36.0)
MCV: 101.2 fL — ABNORMAL HIGH (ref 80.0–100.0)
Platelets: 380 10*3/uL (ref 150–400)
RBC: 3.22 MIL/uL — ABNORMAL LOW (ref 3.87–5.11)
RDW: 19.8 % — ABNORMAL HIGH (ref 11.5–15.5)
WBC: 6.5 10*3/uL (ref 4.0–10.5)
nRBC: 0.3 % — ABNORMAL HIGH (ref 0.0–0.2)

## 2021-03-02 LAB — COMPREHENSIVE METABOLIC PANEL
ALT: 16 U/L (ref 0–44)
AST: 21 U/L (ref 15–41)
Albumin: 1.9 g/dL — ABNORMAL LOW (ref 3.5–5.0)
Alkaline Phosphatase: 75 U/L (ref 38–126)
Anion gap: 5 (ref 5–15)
BUN: 49 mg/dL — ABNORMAL HIGH (ref 8–23)
CO2: 27 mmol/L (ref 22–32)
Calcium: 9.1 mg/dL (ref 8.9–10.3)
Chloride: 104 mmol/L (ref 98–111)
Creatinine, Ser: 0.77 mg/dL (ref 0.44–1.00)
GFR, Estimated: >60 mL/min (ref >60)
Glucose, Bld: 117 mg/dL — ABNORMAL HIGH (ref 70–99)
Potassium: 4.9 mmol/L (ref 3.5–5.1)
Sodium: 136 mmol/L (ref 135–145)
Total Bilirubin: 0.8 mg/dL (ref 0.3–1.2)
Total Protein: 5.2 g/dL — ABNORMAL LOW (ref 6.5–8.1)

## 2021-03-02 MED ORDER — CYCLOSPORINE 25 MG PO CAPS: 75.0000 mg | ORAL_CAPSULE | Freq: Two times a day (BID) | ORAL | 2 refills | Status: DC

## 2021-03-02 MED ORDER — CIPROFLOXACIN HCL 500 MG PO TABS: 500.0000 mg | ORAL_TABLET | Freq: Two times a day (BID) | ORAL | 0 refills | Status: DC

## 2021-03-02 MED ORDER — CARBAMIDE PEROXIDE 6.5 % OT SOLN: 5.0000 [drp] | Freq: Two times a day (BID) | OTIC | 0 refills | Status: DC

## 2021-03-02 MED ORDER — PREDNISONE 5 MG PO TABS: 5.0000 mg | ORAL_TABLET | Freq: Every day | ORAL | 2 refills | Status: AC

## 2021-03-02 MED ORDER — OXYCODONE HCL 5 MG PO TABS
5.0000 mg | ORAL_TABLET | Freq: Four times a day (QID) | ORAL | 0 refills | Status: DC | PRN
Start: 2021-03-02 — End: 2024-01-09

## 2021-03-02 MED ORDER — GLUCOSE 40 % PO GEL
ORAL | Status: AC
  Administered 2021-03-02: 37.5 g
  Filled 2021-03-02: qty 1

## 2021-03-02 MED ORDER — LOPERAMIDE HCL 2 MG PO CAPS: 2.0000 mg | ORAL_CAPSULE | ORAL | 0 refills | Status: DC | PRN

## 2021-03-02 MED ORDER — COLLAGENASE 250 UNIT/GM EX OINT: TOPICAL_OINTMENT | Freq: Every day | CUTANEOUS | 2 refills | Status: DC

## 2021-03-02 MED ORDER — CHOLESTYRAMINE 4 G PO PACK: 4.0000 g | PACK | Freq: Two times a day (BID) | ORAL | 12 refills | Status: DC

## 2021-03-02 MED ORDER — LEVOTHYROXINE SODIUM 88 MCG PO TABS: 88.0000 ug | ORAL_TABLET | Freq: Every day | ORAL | 2 refills | Status: DC

## 2021-03-02 MED ORDER — AZATHIOPRINE 50 MG PO TABS: 50.0000 mg | ORAL_TABLET | Freq: Every day | ORAL | 2 refills | Status: DC

## 2021-03-02 MED ORDER — INSULIN GLARGINE 100 UNIT/ML ~~LOC~~ SOLN: 6.0000 [IU] | Freq: Two times a day (BID) | SUBCUTANEOUS | 11 refills | Status: DC

## 2021-03-02 MED ORDER — PANTOPRAZOLE SODIUM 40 MG PO TBEC: 40.0000 mg | DELAYED_RELEASE_TABLET | Freq: Every day | ORAL | Status: DC

## 2021-03-02 MED ORDER — SERTRALINE HCL 25 MG PO TABS: 25.0000 mg | ORAL_TABLET | Freq: Every day | ORAL | 2 refills | Status: DC

## 2021-03-02 MED ORDER — ATORVASTATIN CALCIUM 10 MG PO TABS: 10.0000 mg | ORAL_TABLET | Freq: Every day | ORAL | 2 refills | Status: DC

## 2021-03-02 MED ORDER — ONDANSETRON 4 MG PO TBDP: 4.0000 mg | ORAL_TABLET | ORAL | 2 refills | Status: DC | PRN

## 2021-03-02 MED ORDER — SACCHAROMYCES BOULARDII 250 MG PO CAPS: 250.0000 mg | ORAL_CAPSULE | Freq: Two times a day (BID) | ORAL | 2 refills | Status: DC

## 2021-03-02 MED ORDER — ZINC OXIDE 40 % EX OINT: TOPICAL_OINTMENT | CUTANEOUS | 0 refills | Status: DC | PRN

## 2021-03-02 MED ORDER — INSULIN LISPRO 100 UNIT/ML ~~LOC~~ SOLN: 2.0000 [IU] | Freq: Three times a day (TID) | SUBCUTANEOUS | 11 refills | Status: DC

## 2021-03-02 NOTE — Discharge Summary (Addendum)
Physician Discharge Summary  Tykiera Raven IRJ:[id number] DOB: [birth year] DOA: 02/25/2021  PCP: [name], MD  Admit date: 02/25/2021 Discharge date: 03/02/2021  Admitted From: Home Disposition: Home  Recommendations for Outpatient Follow-up:  1. Follow up with PCP in 1-2 weeks 2. Please obtain BMP/CBC in one week 3. Follow-up with ENT for new onset deafness 4. Follow-up with interventional radiology to remove the PEG tube in 2 to 4 weeks.  Home Health: PT OT and nurse Equipment/Devices: None Discharge Condition stable  CODE STATUS: DO NOT RESUSCITATE  diet recommendation heart healthy carb modified Brief/Interim Summary: 60 yo female DM TYPE I FTT HTN R BKA RENAL TRANSPLANT on immunosuppressants.  History obtained from patient's husband and ER records.  Multiple hospital admissions in the last 6 months reviewed in the chart. 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  She was sent to [place] home where she started to have nausea vomiting and diarrhea per husband she had food poisoning and was sent to [facility] for 1 week and was sent out to [facility] again for 3 weeks approximately she had an infected bedsore she was again sent back to [facility] and then discharged to [facility] for 1 week she was again admitted to [facility] for dehydration/sepsis and sacral infected decubitus with osteomyelitis PICC line placed IV antibiotics given.  Then discharged to [facility] from where she was discharged to [facility] where she was treated with antibiotics for questionable source of infection and then she was discharged to home.  Per husband he was going to take care of her at home with home health.  However patient was more in the state of  unresponsiveness and he was not able to care for her. Per husband patient's baseline she was awake alert talking walking prior to amputation.  She had a PEG tube placed at [place].  She got diagnosed with Covid in [facility] around January 30. He wants [place] to do everything possible for her to get her back to her baseline.  However he did agree to DNR/DNI. In the ED CT of the abdomen and pelvis shows possible acute cholecystitis and large gallbladder.  Normal LFTs and white count.  However her blood pressure was low at 85 in the ED.  T-max was 99 heart rate was 102.  Patient is on multiple immunosuppressants including cyclosporine Imuran and prednisone.  Discharge Diagnoses:  Principal Problem:   Acute metabolic encephalopathy Active Problems:   S/p cadaver renal transplant   Sacral decubitus ulcer, stage IV (HCC)   Type 1 diabetes mellitus with hyperlipidemia (HCC)   Unspecified severe protein-calorie malnutrition (HCC)   Acute lower UTI   Anemia    # 1 Acute Toxic metabolic encephalopathy-this was  thought to be secondary to multifactorial etiologies including Pseudomonas UTI  SENSITIVIve to cipro, SEVERE ANEMIA  stage IV sacral decubitus, possible acute cholecystitis .Per husband her baseline mental status is she is awake and alert and answer questions appropriately. Since her amputation of the right lower extremity in October 2021 she has been deteriorating. She has been in and out of hospitals and nursing home since then. CT of the abdomen and pelvis this admission does not reveal any evidence of osteomyelitis in the sacral region. DCd Zosyn and  started ciprofloxacin 3/6 urine culture grew more than 100,000 colonies of Pseudomonas.  #2 SIRS patient met SIRS criteria on admission with hypotension systolic blood pressure less than 90 with tachycardia with a heart rate above 100.  Lactic level was normal white count was normal though she is immunosuppressed from chronic prednisone Imuran and  cyclosporine.  #3 anemia of chronic disease hemoglobin 6.9 she got 2 units of blood transfusion hemoglobin up to 10.2.   FOBT NEGATIVE.  Hemoglobin has been stable.  #4Stage 4 sacral decubitus and stage II left heel-POA.  Seen by wound care .  Cleanse sacral wound with normal saline and pat dry.  Line wound bed with Aquacel Ag failure wound depth with fluffed gauze.  Top with dry gauze and ABD pads and tape.  Change every other day.  Silicone foam to left heel wound change every other day.  #5ACUTE cholecystitis by CT scan.  Ultrasound shows distended gallbladder containing stones with trace pericholecystic fluid but normal wall thickness.  Patient is asymptomatic.  Common bile duct diameter upper normal.  Normal wbc and normal Lfts   #6 s/p renal transplant -on imuran solumedrol and cyclosporine  #7 hypoalbumenemia dietary consulted Albumin 1.9.  Restarted tube feedings.  Patient and family is adamant about getting the PEG tube out.  Staff reports she is eating 75 to 90% of her food.  Follow-up with interventional radiology in 2 to 4 weeks to take the PEG tube out.  #8 hypothyroidsm continue Synthroid.  #9type 1 DM hemoglobin A1c of 6.9 she is status post pancreatic transplant.  Continue Lantus and NovoLog.Marland Kitchen   #10 R BKA site looks clean dry and intact.  # 11 CKD STAGE 3 A creatinine stable.  At baseline.  #13 urinary retention Foley was placed initially on January 21.  Will need to continue Foley on discharge.  She has stage IV sacral decubitus ulcer.  Follow-up with urology.  #14 goals of care discussed in detail with patient's husband.  He makes decisions for his wife when she cannot.  He has changed her CODE STATUS to DNR/DNI.With multiple comorbidities and poor functional status poor nutritional status her long-term prognosis is extremely poor.  This was discussed with her husband [name].  #15 she was tested Covid positive on January 30 at [facility] she has recovered from  [facility] and it has been more than 21 days will DC precautions.  Discussed with ID.  #16 status post PEG tube this was placed in January 2022 with [facility].  Patient and husband want the tube removed since patient is eating well.  I discussed with IR who recommends having the tube in at least 8 weeks prior to taking it out.  She will have to follow-up with IR after discharge.  #17 deafness patient and family reports this is fairly new.  Mild improvement with Debrox eardrops and cleaning the ear out.  There was minimal amount of wax in both ears.  She will need outpatient ENT follow-up.  Pressure Injury 12/10/20 Heel Left;Posterior Stage 2 -  Partial thickness loss of dermis presenting as a shallow open injury with a red, pink wound bed without slough. (Active)  12/10/20 1325  Location: Heel  Location Orientation: Left;Posterior  Staging: Stage 2 -  Partial thickness loss of dermis presenting as a shallow open injury with a red, pink wound bed without slough.  Wound Description (Comments):   Present on Admission: Yes     Pressure Injury 12/24/20 Sacrum Stage 4 - Full thickness  tissue loss with exposed bone, tendon or muscle. (Active)  12/24/20   Location: Sacrum  Location Orientation:   Staging: Stage 4 - Full thickness tissue loss with exposed bone, tendon or muscle.  Wound Description (Comments):   Present on Admission: Yes     Pressure Injury 02/26/21 Buttocks Right Stage 2 -  Partial thickness loss of dermis presenting as a shallow open injury with a red, pink wound bed without slough. (Active)  02/26/21 0600  Location: Buttocks  Location Orientation: Right  Staging: Stage 2 -  Partial thickness loss of dermis presenting as a shallow open injury with a red, pink wound bed without slough.  Wound Description (Comments):   Present on Admission:       Nutrition Problem: Increased nutrient needs Etiology: acute illness,wound healing    Signs/Symptoms: estimated  needs     Interventions: Tube feeding,Juven,Prostat  Estimated body mass index is 18.2 kg/m as calculated from the following:   Height as of this encounter: 5' 3.6" (1.615 m).   Weight as of this encounter: 47.5 kg.  Discharge Instructions   Allergies as of 03/02/2021   No Known Allergies     Medication List    STOP taking these medications   aluminum-magnesium hydroxide-simethicone 200-200-20 MG/5ML Susp Commonly known as: MAALOX   docusate sodium 100 MG capsule Commonly known as: COLACE   enoxaparin 30 MG/0.3ML injection Commonly known as: LOVENOX   HYDROcodone-acetaminophen 5-325 MG tablet Commonly known as: NORCO/VICODIN   metoprolol tartrate 25 MG tablet Commonly known as: LOPRESSOR   miconazole 2 % powder Commonly known as: MICOTIN   sodium bicarbonate 650 MG tablet   Sodium Hypochlorite 0.25 % Soln   temazepam 15 MG capsule Commonly known as: RESTORIL     TAKE these medications   acetaminophen 325 MG tablet Commonly known as: TYLENOL Take 2 tablets (650 mg total) by mouth every 6 (six) hours as needed for mild pain (or Fever >/= 101). What changed: reasons to take this   aspirin EC 81 MG tablet Take 81 mg by mouth daily. Swallow whole.   atorvastatin 10 MG tablet Commonly known as: LIPITOR Take 1 tablet (10 mg total) by mouth daily.   azaTHIOprine 50 MG tablet Commonly known as: IMURAN Take 1 tablet (50 mg total) by mouth daily.   B Complex-C-Folic Acid Tabs Take 1 tablet by mouth daily.   carbamide peroxide 6.5 % OTIC solution Commonly known as: DEBROX Place 5 drops into the right ear 2 (two) times daily.   cholestyramine 4 g packet Commonly known as: QUESTRAN Take 1 packet (4 g total) by mouth 2 (two) times daily.   ciprofloxacin 500 MG tablet Commonly known as: CIPRO Take 1 tablet (500 mg total) by mouth 2 (two) times daily.   collagenase ointment Commonly known as: SANTYL Apply topically daily.   cycloSPORINE 25 MG  capsule Commonly known as: SANDIMMUNE Take 3 capsules (75 mg total) by mouth 2 (two) times daily.   insulin glargine 100 UNIT/ML injection Commonly known as: LANTUS Inject 0.06 mLs (6 Units total) into the skin 2 (two) times daily. What changed:   how much to take  when to take this   insulin lispro 100 UNIT/ML injection Commonly known as: HUMALOG Inject 0.02 mLs (2 Units total) into the skin 3 (three) times daily before meals. Sliding scale if 200-250=2 units;251-300=4units;301-350=6 units;351-400=8 units;104-450=10 units;451-500=12 units subcutaneously before meals and at bedtime for DM   levothyroxine 88 MCG tablet Commonly known as: SYNTHROID Take 1 tablet (  88 mcg total) by mouth daily before breakfast.   liver oil-zinc oxide 40 % ointment Commonly known as: DESITIN Apply topically as needed for irritation.   loperamide 2 MG capsule Commonly known as: IMODIUM Take 1 capsule (2 mg total) by mouth as needed for diarrhea or loose stools.   ondansetron 4 MG disintegrating tablet Commonly known as: ZOFRAN-ODT Take 1 tablet (4 mg total) by mouth every 4 (four) hours as needed for nausea or vomiting.   oxyCODONE 5 MG immediate release tablet Commonly known as: Oxy IR/ROXICODONE Take 1 tablet (5 mg total) by mouth every 6 (six) hours as needed for severe pain.   pantoprazole 40 MG tablet Commonly known as: PROTONIX Take 1 tablet (40 mg total) by mouth daily.   predniSONE 5 MG tablet Commonly known as: DELTASONE Take 1 tablet (5 mg total) by mouth daily with breakfast.   saccharomyces boulardii 250 MG capsule Commonly known as: FLORASTOR Take 1 capsule (250 mg total) by mouth 2 (two) times daily.   sertraline 25 MG tablet Commonly known as: ZOLOFT Take 1 tablet (25 mg total) by mouth daily.       Follow-up Information    [name], MD Follow up.   Specialty: Family Medicine Contact information: [place]  [id number] [phone]        [facility] Follow up.   Why: agency will provide home health nurse, physical, and occupational therapy Contact information: [place] [address] [phone]              No Known Allergies  Consultations:  None   Procedures/Studies: DG Chest 1 View  Result Date: 02/25/2021 CLINICAL DATA:  Stage IV decubitus sacral ulcer, diabetes, altered level of consciousness EXAM: CHEST  1 VIEW COMPARISON:  01/18/2021 FINDINGS: Single frontal view of the chest demonstrates a stable cardiac silhouette. There is streaky consolidation at the left lung base which may reflect atelectasis or airspace disease. No effusion or pneumothorax. No acute bony abnormalities. Prior healed right rib fractures. IMPRESSION: 1. Streaky left basilar consolidation consistent with atelectasis or airspace disease. Electronically Signed   By: Randa Ngo M.D.   On: 02/25/2021 20:04   CT Head Wo Contrast  Result Date: 02/25/2021 CLINICAL DATA:  Mental status change EXAM: CT HEAD WITHOUT CONTRAST TECHNIQUE: Contiguous axial images were obtained from the base of the skull through the vertex without intravenous contrast. COMPARISON:  MRI 01/23/2021, CT 01/18/2021 FINDINGS: Brain: No acute territorial infarction, hemorrhage or intracranial mass. Moderate atrophy. Moderate hypodensity in the white matter consistent with chronic small vessel ischemic change. Stable ventricle size. Vascular: No hyperdense vessels. Vertebral and carotid vascular calcification Skull: Normal. Negative for fracture or focal lesion. Sinuses/Orbits: Bilateral mastoid effusions. Other: None IMPRESSION: 1. No CT evidence for acute intracranial abnormality. 2. Atrophy and chronic small vessel ischemic changes of the white matter. 3. Bilateral mastoid effusion Electronically Signed   By: Donavan Foil M.D.   On: 02/25/2021 22:26   CT Abdomen Pelvis W Contrast  Addendum Date: 02/25/2021    ADDENDUM REPORT: 02/25/2021 22:47 ADDENDUM: Slightly dilated extrahepatic common bile duct, recommend correlation with LFTs with follow-up MRCP as indicated Electronically Signed   By: Donavan Foil M.D.   On: 02/25/2021 22:47   Result Date: 02/25/2021 CLINICAL DATA:  Abdominal pain sacral ulcer EXAM: CT ABDOMEN AND PELVIS WITH CONTRAST TECHNIQUE: Multidetector CT imaging of the abdomen and pelvis was performed using the standard protocol  following bolus administration of intravenous contrast. CONTRAST:  146m OMNIPAQUE IOHEXOL 300 MG/ML  SOLN COMPARISON:  CT 12/07/2020, ultrasound 01/21/2021, 10/29/2020 FINDINGS: Lower chest: Lung bases demonstrate partial consolidations within the bilateral lower lobes. Cardiomegaly with coronary vascular calcification. Circumferential distal esophageal thickening. Hepatobiliary: No focal hepatic abnormality. Markedly distended gallbladder containing stones. Extrahepatic common bile duct slightly enlarged at 8 mm at the head of pancreas. Pancreas: Mild pancreatic ductal dilatation. No inflammatory changes. Spleen: Normal in size without focal abnormality. Adrenals/Urinary Tract: Adrenal glands are normal. Atrophic native kidneys without hydronephrosis. Transplanted kidney within the pelvis slightly to the left of midline. Cyst in the lower pole of the transplanted kidney. No hydronephrosis. Urinary bladder obscured by artifact from right hip hardware. There is a Foley catheter in the bladder. Bladder appears thick walled. Stomach/Bowel: Gastrostomy tube within the body of the stomach. No dilated small bowel. No acute bowel wall thickening Vascular/Lymphatic: Extensive aortic atherosclerosis. 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 No suspicious nodes. Reproductive: No obvious adnexal mass Other: No free air. There is free fluid in the pelvis. There is generalized anasarca. Musculoskeletal: Deep sacral decubitus ulcer without drainable abscess or  definitive osseous destructive change. IMPRESSION: 1. Deep sacral decubitus ulcer without drainable soft tissue abscess or definitive osseous destructive change of the distal sacrum or coccyx. 2. Markedly distended gallbladder containing stones. Ultrasound correlation if concern for acute gallbladder disease. 3. Partial consolidations within the bilateral lower lobes, atelectasis versus pneumonia. 4. Atrophic native kidneys. Transplanted kidney within the pelvis slightly to the left of midline without definitive hydronephrosis. Possible thick-walled appearance of the urinary bladder, question cystitis. 5. Small amount of free fluid in the pelvis. Generalized anasarca. 6. Circumferential distal esophageal thickening, could be secondary to reflux or esophagitis. Aortic Atherosclerosis (ICD10-I70.0). Electronically Signed: By: KDonavan FoilM.D. On: 02/25/2021 22:40   UKoreaAbdomen Limited RUQ (LIVER/GB)  Result Date: 02/25/2021 CLINICAL DATA:  Distended gallbladder abnormal CT EXAM: ULTRASOUND ABDOMEN LIMITED RIGHT UPPER QUADRANT COMPARISON:  CT 02/25/2021 FINDINGS: Gallbladder: Multiple shadowing stones measuring up to 3.2 cm. Normal wall thickness. Unable to reliably assess sonographic Martell. Trace pericholecystic fluid. Common bile duct: Diameter: 7 mm Liver: No focal lesion identified. Within normal limits in parenchymal echogenicity. Portal vein is patent on color Doppler imaging with normal direction of blood flow towards the liver. Other: None. IMPRESSION: 1. Distended gallbladder containing stones. There is trace pericholecystic fluid but normal wall thickness. Unable to reliably assess sonographic Servello. Findings are indeterminate for cholecystitis; correlation with nuclear medicine hepatobiliary could be obtained. 2. Common bile duct diameter upper normal, correlate with LFTs Electronically Signed   By: KDonavan FoilM.D.   On: 02/25/2021 23:36   (Echo, Carotid, EGD, Colonoscopy, ERCP)     Subjective: Patient resting in bed awake alert very hard of hearing anxious to go home but concerned.  Discharge Exam: Vitals:   03/01/21 2018 03/02/21 0439  BP: (!) 144/68 128/66  Pulse: 69 70  Resp: 18 16  Temp: 98.1 F (36.7 C) 98 F (36.7 C)  SpO2: 99% 99%   Vitals:   03/01/21 1223 03/01/21 2018 03/02/21 0439 03/02/21 0500  BP: (!) 144/73 (!) 144/68 128/66   Pulse: 74 69 70   Resp: _0 Temp: 98.6 F (37 C) 98.1 F (36.7 C) 98 F (36.7 C)   TempSrc: Oral Oral Oral   SpO2: 100% 99% 99%   Weight:    47.5 kg  Height:  General: Pt is alert, awake, not in acute distress Cardiovascular: RRR, S1/S2 +, no rubs, no gallops Respiratory: CTA bilaterally, no wheezing, no rhonchi Abdominal: Soft, NT, ND, bowel sounds + PEG tube in place Extremities: Right BKA  The results of significant diagnostics from this hospitalization (including imaging, microbiology, ancillary and laboratory) are listed below for reference.     Microbiology: Recent Results (from the past 240 hour(s))  Urine culture     Status: Abnormal   Collection Time: 02/25/21  7:40 PM   Specimen: Urine, Clean Catch  Result Value Ref Range Status   Specimen Description   Final    URINE, CLEAN CATCH Performed at [facility], [place] [address] [place], [place] [address]    Special Requests   Final    NONE Performed at [facility], [place] [address] [place], [place] [address]    Culture >=100,000 COLONIES/mL PSEUDOMONAS AERUGINOSA (A)  Final   Report Status 02/28/2021 FINAL  Final   Organism ID, Bacteria PSEUDOMONAS AERUGINOSA (A)  Final      Susceptibility   Pseudomonas aeruginosa - MIC*    CEFTAZIDIME >=64 RESISTANT Resistant     CIPROFLOXACIN 0.5 SENSITIVE Sensitive     GENTAMICIN <=1 SENSITIVE Sensitive     IMIPENEM 2 SENSITIVE Sensitive     * >=100,000 COLONIES/mL PSEUDOMONAS AERUGINOSA  Culture, blood (routine x 2)     Status: None (Preliminary  result)   Collection Time: 02/25/21  8:25 PM   Specimen: BLOOD LEFT WRIST  Result Value Ref Range Status   Specimen Description   Final    BLOOD LEFT WRIST Performed at [facility] [address] [place], [place] [address]    Special Requests   Final    BOTTLES DRAWN AEROBIC AND ANAEROBIC Blood Culture adequate volume Performed at [place] [address], [place], [place] [address]    Culture   Final    NO GROWTH 3 DAYS Performed at [facility], [place] [address] [place], [place] [address]    Report Status PENDING  Incomplete  Culture, blood (routine x 2)     Status: None (Preliminary result)   Collection Time: 02/25/21  8:30 PM   Specimen: BLOOD  Result Value Ref Range Status   Specimen Description   Final    BLOOD LEFT ANTECUBITAL Performed at [facility] [address], [place], [place] [address]    Special Requests   Final    BOTTLES DRAWN AEROBIC AND ANAEROBIC Blood Culture adequate volume Performed at [facility] [address] [place], [place] [address]    Culture   Final    NO GROWTH 3 DAYS Performed at [facility], [place] [address], [place], [place] [address]    Report Status PENDING  Incomplete  Resp Panel by RT-PCR (Flu A&B, Covid) Nasopharyngeal Swab     Status: Abnormal   Collection Time: 02/25/21 11:45 PM   Specimen: Nasopharyngeal Swab; Nasopharyngeal(NP) swabs in vial transport medium  Result Value Ref Range Status   SARS Coronavirus 2 by RT PCR POSITIVE (A) NEGATIVE Final    Comment: RESULT CALLED TO, READ BACK BY AND VERIFIED WITH: SMITH J.03.03.22 @ 0124 BY MECIAL MECIAL J. (NOTE) SARS-CoV-2 target nucleic acids are DETECTED.  The SARS-CoV-2 RNA is generally detectable in upper respiratory specimens during the acute phase of infection. Positive results are indicative of the presence of the identified virus, but do not rule out bacterial  infection or  co-infection with other pathogens not detected by the test. Clinical correlation with patient history and other diagnostic information is necessary to determine patient infection status. The expected result is Negative.  Fact Sheet for Patients: EntrepreneurPulse.com.au  Fact Sheet for Healthcare Providers: IncredibleEmployment.be  This test is not yet approved or cleared by the [place] FDA and  has been authorized for detection and/or diagnosis of SARS-CoV-2 by FDA under an Emergency Use Authorization (EUA).  This EUA will remain in effect (meaning this t est can be used) for the duration of  the COVID-19 declaration under Section 564(b)(1) of the Act, 21 U.S.C. section 360bbb-3(b)(1), unless the authorization is terminated or revoked sooner.     Influenza A by PCR NEGATIVE NEGATIVE Final   Influenza B by PCR NEGATIVE NEGATIVE Final    Comment: (NOTE) The Xpert Xpress SARS-CoV-2/FLU/RSV plus assay is intended as an aid in the diagnosis of influenza from Nasopharyngeal swab specimens and should not be used as a sole basis for treatment. Nasal washings and aspirates are unacceptable for Xpert Xpress SARS-CoV-2/FLU/RSV testing.  Fact Sheet for Patients: EntrepreneurPulse.com.au  Fact Sheet for Healthcare Providers: IncredibleEmployment.be  This test is not yet approved or cleared by the [place] FDA and has been authorized for detection and/or diagnosis of SARS-CoV-2 by FDA under an Emergency Use Authorization (EUA). This EUA will remain in effect (meaning this test can be used) for the duration of the COVID-19 declaration under Section 564(b)(1) of the Act, 21 U.S.C. section 360bbb-3(b)(1), unless the authorization is terminated or revoked.  Performed at [facility], [place] [address] [place], [place] [address]   MRSA PCR Screening     Status: None   Collection Time:  02/28/21 12:28 AM   Specimen: Nasal Mucosa; Nasopharyngeal  Result Value Ref Range Status   MRSA by PCR NEGATIVE NEGATIVE Final    Comment:        The GeneXpert MRSA Assay (FDA approved for NASAL specimens only), is one component of a comprehensive MRSA colonization surveillance program. It is not intended to diagnose MRSA infection nor to guide or monitor treatment for MRSA infections. Performed at [facility], [place] [address], [place], [place] [address]      Labs: BNP (last 3 results) No results for input(s): BNP in the last 8760 hours. Basic Metabolic Panel: Recent Labs  Lab 02/26/21 1306 02/26/21 1638 02/26/21 2051 02/27/21 0341 02/27/21 1612 02/28/21 0409 03/01/21 0351 03/02/21 0424  NA 140  --   --  134*  --  137 137 136  K 4.7  --   --  3.2*  --  3.0* 5.4* 4.9  CL 108  --   --  104  --  103 107 104  CO2 20*  --   --  18*  --  _0 GLUCOSE 245*  --  570* 465*  --  282* 201* 117*  BUN 35*  --   --  45*  --  37* 46* 49*  CREATININE 0.73  --   --  1.00  --  0.83 0.96 0.77  CALCIUM 9.4  --   --  9.2  --  9.0 9.0 9.1  MG 1.9 1.9  --  1.8 1.8 1.8  --   --   PHOS 4.2 4.3  --  3.4 2.9  --   --   --    Liver Function Tests: Recent Labs  Lab 02/26/21 1306 02/27/21 0341 02/28/21 0409 03/01/21  4982 03/02/21 0424  AST _0 ALT _1 ALKPHOS 110 93 80 74 75  BILITOT 0.9 0.9 0.5 0.5 0.8  PROT 5.9* 5.5* 5.3* 5.4* 5.2*  ALBUMIN 2.0* 2.0* 1.8* 2.0* 1.9*   No results for input(s): LIPASE, AMYLASE in the last 168 hours. No results for input(s): AMMONIA in the last 168 hours. CBC: Recent Labs  Lab 02/25/21 2000 02/26/21 1306 02/27/21 0341 02/28/21 0409 03/01/21 0351 03/02/21 0424  WBC 7.9 5.4 6.8 7.5 5.8 6.5  NEUTROABS 4.6  --   --   --   --   --   HGB 6.9* 11.2* 9.9* 10.0* 10.7* 10.2*  HCT 21.3* 34.6* 31.4* 31.4* 34.8* 32.6*  MCV 103.4* 97.5 99.1 98.7 101.8* 101.2*  PLT 403* 361 369 388 385 380   Cardiac  Enzymes: No results for input(s): CKTOTAL, CKMB, CKMBINDEX, TROPONINI in the last 168 hours. BNP: Invalid input(s): POCBNP CBG: Recent Labs  Lab 03/01/21 1634 03/01/21 2008 03/01/21 2352 03/02/21 0441 03/02/21 0736  GLUCAP 185* 202* 183* 112* 220*   D-Dimer No results for input(s): DDIMER in the last 72 hours. Hgb A1c No results for input(s): HGBA1C in the last 72 hours. Lipid Profile No results for input(s): CHOL, HDL, LDLCALC, TRIG, CHOLHDL, LDLDIRECT in the last 72 hours. Thyroid function studies No results for input(s): TSH, T4TOTAL, T3FREE, THYROIDAB in the last 72 hours.  Invalid input(s): FREET3 Anemia work up No results for input(s): VITAMINB12, FOLATE, FERRITIN, TIBC, IRON, RETICCTPCT in the last 72 hours. Urinalysis    Component Value Date/Time   COLORURINE AMBER (A) 02/25/2021 1940   APPEARANCEUR TURBID (A) 02/25/2021 1940   LABSPEC 1.011 02/25/2021 1940   PHURINE 6.0 02/25/2021 1940   GLUCOSEU NEGATIVE 02/25/2021 1940   HGBUR MODERATE (A) 02/25/2021 1940   BILIRUBINUR NEGATIVE 02/25/2021 [place] NEGATIVE 02/25/2021 1940   PROTEINUR 100 (A) 02/25/2021 1940   NITRITE NEGATIVE 02/25/2021 1940   LEUKOCYTESUR LARGE (A) 02/25/2021 1940   Sepsis Labs Invalid input(s): PROCALCITONIN,  WBC,  LACTICIDVEN Microbiology Recent Results (from the past 240 hour(s))  Urine culture     Status: Abnormal   Collection Time: 02/25/21  7:40 PM   Specimen: Urine, Clean Catch  Result Value Ref [place] Status   Specimen Description   Final    URINE, CLEAN CATCH Performed at [facility], [place] [address], [place], [place] [address]    Special Requests   Final    NONE Performed at [facility], [place] [address] [place], [place] [address]    Culture >=100,000 COLONIES/mL PSEUDOMONAS AERUGINOSA (A)  Final   Report Status 02/28/2021 FINAL  Final   Organism ID, Bacteria PSEUDOMONAS AERUGINOSA (A)  Final      Susceptibility    Pseudomonas aeruginosa - MIC*    CEFTAZIDIME >=64 RESISTANT Resistant     CIPROFLOXACIN 0.5 SENSITIVE Sensitive     GENTAMICIN <=1 SENSITIVE Sensitive     IMIPENEM 2 SENSITIVE Sensitive     * >=100,000 COLONIES/mL PSEUDOMONAS AERUGINOSA  Culture, blood (routine x 2)     Status: None (Preliminary result)   Collection Time: 02/25/21  8:25 PM   Specimen: BLOOD LEFT WRIST  Result Value Ref Range Status   Specimen Description   Final    BLOOD LEFT WRIST Performed at [place] [address], [place],  [address]    Special Requests   Final    BOTTLES DRAWN AEROBIC  AND ANAEROBIC Blood Culture adequate volume Performed at [place] [address] [place], [place] [address]    Culture   Final    NO GROWTH 3 DAYS Performed at [facility], [place] [address], [place], [place] [address]    Report Status PENDING  Incomplete  Culture, blood (routine x 2)     Status: None (Preliminary result)   Collection Time: 02/25/21  8:30 PM   Specimen: BLOOD  Result Value Ref [place] Status   Specimen Description   Final    BLOOD LEFT ANTECUBITAL Performed at [place] [address] [place], [place] [address]    Special Requests   Final    BOTTLES DRAWN AEROBIC AND ANAEROBIC Blood Culture adequate volume Performed at [place] [address], [place], [place] [address]    Culture   Final    NO GROWTH 3 DAYS Performed at [facility], [place] [address] [place], [place] [address]    Report Status PENDING  Incomplete  Resp Panel by RT-PCR (Flu A&B, Covid) Nasopharyngeal Swab     Status: Abnormal   Collection Time: 02/25/21 11:45 PM   Specimen: Nasopharyngeal Swab; Nasopharyngeal(NP) swabs in vial transport medium  Result Value Ref Range Status   SARS Coronavirus 2 by RT PCR POSITIVE (A) NEGATIVE Final    Comment: RESULT CALLED TO, READ BACK BY AND VERIFIED WITH: SMITH J.03.03.22 @ 0124 BY MECIAL  MECIAL J. (NOTE) SARS-CoV-2 target nucleic acids are DETECTED.  The SARS-CoV-2 RNA is generally detectable in upper respiratory specimens during the acute phase of infection. Positive results are indicative of the presence of the identified virus, but do not rule out bacterial infection or co-infection with other pathogens not detected by the test. Clinical correlation with patient history and other diagnostic information is necessary to determine patient infection status. The expected result is Negative.  Fact Sheet for Patients: EntrepreneurPulse.com.au  Fact Sheet for Healthcare Providers: IncredibleEmployment.be  This test is not yet approved or cleared by the [place] FDA and  has been authorized for detection and/or diagnosis of SARS-CoV-2 by FDA under an Emergency Use Authorization (EUA).  This EUA will remain in effect (meaning this t est can be used) for the duration of  the COVID-19 declaration under Section 564(b)(1) of the Act, 21 U.S.C. section 360bbb-3(b)(1), unless the authorization is terminated or revoked sooner.     Influenza A by PCR NEGATIVE NEGATIVE Final   Influenza B by PCR NEGATIVE NEGATIVE Final    Comment: (NOTE) The Xpert Xpress SARS-CoV-2/FLU/RSV plus assay is intended as an aid in the diagnosis of influenza from Nasopharyngeal swab specimens and should not be used as a sole basis for treatment. Nasal washings and aspirates are unacceptable for Xpert Xpress SARS-CoV-2/FLU/RSV testing.  Fact Sheet for Patients: EntrepreneurPulse.com.au  Fact Sheet for Healthcare Providers: IncredibleEmployment.be  This test is not yet approved or cleared by the [place] FDA and has been authorized for detection and/or diagnosis of SARS-CoV-2 by FDA under an Emergency Use Authorization (EUA). This EUA will remain in effect (meaning this test can be used) for the duration of  the COVID-19 declaration under Section 564(b)(1) of the Act, 21 U.S.C. section 360bbb-3(b)(1), unless the authorization is terminated or revoked.  Performed at [facility], [place] [address], [place], [place] [address]   MRSA PCR Screening     Status: None   Collection Time: 02/28/21 12:28 AM   Specimen: Nasal Mucosa; Nasopharyngeal  Result Value Ref [place] Status   MRSA by PCR NEGATIVE NEGATIVE Final    Comment:        The GeneXpert MRSA Assay (FDA approved for NASAL specimens only), is one component of a comprehensive MRSA colonization surveillance program. It is not intended to diagnose MRSA infection nor to guide or monitor treatment for MRSA infections. Performed at [facility], [place] [address] [place], [place] [address]      Time coordinating discharge: 39 minutes  SIGNED:   Georgette Shell, MD  [facility] 03/02/2021, 10:10 AM

## 2021-03-02 NOTE — Progress Notes (Signed)
PROGRESS NOTE    [name]  KZL:[id number] DOB: 1961/07/01 DOA: 02/25/2021 PCP: [name], MD   Brief Narrative: 60 yo female DM TYPE I FTT HTN R BKA RENAL TRANSPLANT on immunosuppressants.  History obtained from patient's husband and ER records.  Multiple hospital admissions in the last 6 months reviewed in the chart. Per husband patient had a right foot amputation in October 2021, she was discharged to a [facility] for 3 weeks where she developed an AKI from dehydration not eating and drinking and she was sent to [facility] for 2 weeks AKI resolved.  She was sent to [place] [facility] where she started to have nausea vomiting and diarrhea per husband she had food poisoning and was sent to [facility] for 1 week and was sent out to [facility] again for 3 weeks approximately she had an infected bedsore she was again sent back to [facility] and then discharged to [place] [facility] for 1 week she was again admitted to [facility] for dehydration/sepsis and sacral infected decubitus with osteomyelitis PICC line placed IV antibiotics given.  Then discharged to [facility] from where she was discharged to [facility] where she was treated with antibiotics for questionable source of infection and then she was discharged to home.  Per husband he was going to take care of her at home with home health.  However patient was more in the state of unresponsiveness and he was not able to care for her. Per husband patient's baseline she was awake alert talking walking prior to amputation.  She had a PEG tube placed at [place].  She got diagnosed with Covid in [facility] around January 30. He wants [place] to do everything possible for her to get her back to her baseline.  However he did agree to DNR/DNI. In the ED CT of the abdomen and pelvis shows possible acute cholecystitis and large gallbladder.  Normal LFTs and white count.  However her blood pressure was low at  85 in the ED.  T-max was 99 heart rate was 102.  Patient is on multiple immunosuppressants including cyclosporine Imuran and prednisone.  Assessment & Plan:   Principal Problem:   Acute metabolic encephalopathy Active Problems:   S/p cadaver renal transplant   Sacral decubitus ulcer, stage IV (HCC)   Type 1 diabetes mellitus with hyperlipidemia (HCC)   Unspecified severe protein-calorie malnutrition (HCC)   Acute lower UTI   Anemia   # 1 Acute Toxic metabolic encephalopathy-this is thought to be secondary to multifactorial etiologies including Pseudomonas UTI  SENSITIVIve to cipro, SEVERE ANEMIA  stage IV sacral decubitus, possible acute cholecystitis .Per husband her baseline mental status is she is awake and alert and answer questions appropriately. Since her amputation of the right lower extremity in October 2021 she has been deteriorating. She has been in and out of hospitals and nursing home since then. CT of the abdomen and pelvis this admission does not reveal any evidence of osteomyelitis in the sacral region. DC Zosyn and started ciprofloxacin 3/6 urine culture grew more than 100,000 colonies of Pseudomonas.  #2 SIRS patient met SIRS criteria on admission with hypotension systolic blood pressure less than 90 with tachycardia with a heart rate above 100.  Lactic level was normal white count was normal though she is immunosuppressed from chronic prednisone Imuran and cyclosporine.  #3 anemia of chronic disease hemoglobin 6.9 she got 2 units of blood transfusion hemoglobin up to 10.2.   FOBT NEGATIVE.  Hemoglobin has been stable.  #4Stage 4 sacral decubitus and stage II left heel-POA.  Seen by wound care continue their recommendations.  #5ACUTE cholecystitis by CT scan.  Ultrasound shows distended gallbladder containing stones with trace pericholecystic fluid but normal wall thickness.  Patient is asymptomatic.  Common bile duct diameter upper normal.  Normal wbc and normal Lfts    #6 s/p renal transplant -on imuran solumedrol and cyclosporine  #7 hypoalbumenemia dietary consulted Albumin 1.9.  Restarted tube feedings.  Patient and family is adamant about getting the PEG tube out.  Staff reports she is eating 75 to 90% of her food.  #8 hypothyroidsm continue Synthroid.  #9type 1 DM hemoglobin A1c of 6.9 she is status post pancreatic transplant. Increased lantus to 6 units bid with novolog 3 units q 4.  CBG (last 3)  Recent Labs    03/01/21 2352 03/02/21 0441 03/02/21 0736  GLUCAP 183* 112* 220*     #10 R BKA site looks clean dry and intact.  # 11 CKD STAGE 3 A creatinine stable.  At baseline.  #13 urinary retention Foley was placed initially on January 21.  Will need to continue Foley on discharge.  She has stage IV sacral decubitus ulcer.  #14 goals of care discussed in detail with patient's husband.  He makes decisions for his wife when she cannot.  He has changed her CODE STATUS to DNR/DNI.With multiple comorbidities and poor functional status poor nutritional status her long-term prognosis is extremely poor.  This was discussed with her husband [name].  #15 she was tested Covid positive on January 30 at [facility] she has recovered from [facility] and it has been more than 21 days will DC precautions.  Discussed with ID.  #16 status post PEG tube this was placed in January 2022 with [facility].  Patient and husband want the tube removed since patient is eating well.  I discussed with IR who recommends having the tube in at least 8 weeks prior to taking it out.  She will have to follow-up with IR after discharge.  #17 deafness patient and family reports this is fairly new.  Mild improvement with Debrox eardrops and cleaning the ear out.  There was minimal amount of wax in both ears.  She will need outpatient ENT follow-up. Pressure Injury 12/10/20 Heel Left;Posterior Stage 2 -  Partial thickness loss of dermis presenting as a shallow open injury with a  red, pink wound bed without slough. (Active)  12/10/20 1325  Location: Heel  Location Orientation: Left;Posterior  Staging: Stage 2 -  Partial thickness loss of dermis presenting as a shallow open injury with a red, pink wound bed without slough.  Wound Description (Comments):   Present on Admission: Yes     Pressure Injury 12/24/20 Sacrum Stage 4 - Full thickness tissue loss with exposed bone, tendon or muscle. (Active)  12/24/20   Location: Sacrum  Location Orientation:   Staging: Stage 4 - Full thickness tissue loss with exposed bone, tendon or muscle.  Wound Description (Comments):   Present on Admission: Yes     Pressure Injury 02/26/21 Buttocks Right Stage 2 -  Partial thickness loss of dermis presenting as a shallow open injury with a red, pink wound bed without slough. (Active)  02/26/21 0600  Location: Buttocks  Location Orientation: Right  Staging: Stage 2 -  Partial thickness loss of dermis presenting as a shallow open injury with a red, pink wound bed without slough.  Wound Description (Comments):  Present on Admission:     Estimated body mass index is 18.2 kg/m as calculated from the following:   Height as of this encounter: 5' 3.6" (1.615 m).   Weight as of this encounter: 47.5 kg.  DVT prophylaxis: Lovenox Code Status: DNR Family Communication: Discussed with husband [name] over the phone  disposition Plan:  Status is: Inpatient  Dispo: The patient is from: SNF              Anticipated d/c is to: home              Patient currently is not medically stable to d/c.  Plan on discharge 03/03/2021   difficult to place patient NO    Consultants: NONE  Procedures: NONE Antimicrobials: zosyn/cipro  Subjective: She is resting in bed hard of hearing  I communicated with her by writing down which she understands.  However she is adamant about staying another day.  She is worried that she will have to come back to the hospital if she goes home today.  She is also  requesting the PEG tube to be taken out today.  objective: Vitals:   03/01/21 1223 03/01/21 2018 03/02/21 0439 03/02/21 0500  BP: (!) 144/73 (!) 144/68 128/66   Pulse: 74 69 70   Resp: '18 18 16   ' Temp: 98.6 F (37 C) 98.1 F (36.7 C) 98 F (36.7 C)   TempSrc: Oral Oral Oral   SpO2: 100% 99% 99%   Weight:    47.5 kg  Height:        Intake/Output Summary (Last 24 hours) at 03/02/2021 0952 Last data filed at 03/02/2021 0500 Gross per 24 hour  Intake 600 ml  Output 1300 ml  Net -700 ml   Filed Weights   02/26/21 0512 03/01/21 0416 03/02/21 0500  Weight: 46 kg 47.2 kg 47.5 kg    Examination: PEG tube and Foley catheter in place  General exam: Chronically ill-appearing  Respiratory system: Clear to auscultation. Respiratory effort normal. Cardiovascular system: S1 & S2 heard, RRR. No JVD, murmurs, rubs, gallops or clicks. No pedal edema. Gastrointestinal system: Abdomen is nondistended, soft and nontender. No organomegaly or masses felt. Normal bowel sounds heard.  PEG tube in place Central nervous system: Awake answering questions appropriately.   Extremities: Right BKA stump clean dry intact Skin: Sacral decubitus and left heel stage II Psychiatry: Appropriate    Data Reviewed: I have personally reviewed following labs and imaging studies  CBC: Recent Labs  Lab 02/25/21 2000 02/26/21 1306 02/27/21 0341 02/28/21 0409 03/01/21 0351 03/02/21 0424  WBC 7.9 5.4 6.8 7.5 5.8 6.5  NEUTROABS 4.6  --   --   --   --   --   HGB 6.9* 11.2* 9.9* 10.0* 10.7* 10.2*  HCT 21.3* 34.6* 31.4* 31.4* 34.8* 32.6*  MCV 103.4* 97.5 99.1 98.7 101.8* 101.2*  PLT 403* 361 369 388 385 881   Basic Metabolic Panel: Recent Labs  Lab 02/26/21 1306 02/26/21 1638 02/26/21 2051 02/27/21 0341 02/27/21 1612 02/28/21 0409 03/01/21 0351 03/02/21 0424  NA 140  --   --  134*  --  137 137 136  K 4.7  --   --  3.2*  --  3.0* 5.4* 4.9  CL 108  --   --  104  --  103 107 104  CO2 20*  --   --  18*   --  '23 22 27  ' GLUCOSE 245*  --  570* 465*  --  282* 201* 117*  BUN 35*  --   --  45*  --  37* 46* 49*  CREATININE 0.73  --   --  1.00  --  0.83 0.96 0.77  CALCIUM 9.4  --   --  9.2  --  9.0 9.0 9.1  MG 1.9 1.9  --  1.8 1.8 1.8  --   --   PHOS 4.2 4.3  --  3.4 2.9  --   --   --    GFR: Estimated Creatinine Clearance: 55.4 mL/min (by C-G formula based on SCr of 0.77 mg/dL). Liver Function Tests: Recent Labs  Lab 02/26/21 1306 02/27/21 0341 02/28/21 0409 03/01/21 0351 03/02/21 0424  AST '18 25 20 21 21  ' ALT '12 13 12 14 16  ' ALKPHOS 110 93 80 74 75  BILITOT 0.9 0.9 0.5 0.5 0.8  PROT 5.9* 5.5* 5.3* 5.4* 5.2*  ALBUMIN 2.0* 2.0* 1.8* 2.0* 1.9*   No results for input(s): LIPASE, AMYLASE in the last 168 hours. No results for input(s): AMMONIA in the last 168 hours. Coagulation Profile: No results for input(s): INR, PROTIME in the last 168 hours. Cardiac Enzymes: No results for input(s): CKTOTAL, CKMB, CKMBINDEX, TROPONINI in the last 168 hours. BNP (last 3 results) No results for input(s): PROBNP in the last 8760 hours. HbA1C: No results for input(s): HGBA1C in the last 72 hours. CBG: Recent Labs  Lab 03/01/21 1634 03/01/21 2008 03/01/21 2352 03/02/21 0441 03/02/21 0736  GLUCAP 185* 202* 183* 112* 220*   Lipid Profile: No results for input(s): CHOL, HDL, LDLCALC, TRIG, CHOLHDL, LDLDIRECT in the last 72 hours. Thyroid Function Tests: No results for input(s): TSH, T4TOTAL, FREET4, T3FREE, THYROIDAB in the last 72 hours. Anemia Panel: No results for input(s): VITAMINB12, FOLATE, FERRITIN, TIBC, IRON, RETICCTPCT in the last 72 hours. Sepsis Labs: Recent Labs  Lab 02/25/21 2000 02/25/21 2130  LATICACIDVEN 0.6 1.0    Recent Results (from the past 240 hour(s))  Urine culture     Status: Abnormal   Collection Time: 02/25/21  7:40 PM   Specimen: Urine, Clean Catch  Result Value Ref Range Status   Specimen Description   Final    URINE, CLEAN CATCH Performed at [facility], [place] [address], [place], [place] [address]    Special Requests   Final    NONE Performed at [facility], [place] [address], [place], [place] [address]    Culture >=100,000 COLONIES/mL PSEUDOMONAS AERUGINOSA (A)  Final   Report Status 02/28/2021 FINAL  Final   Organism ID, Bacteria PSEUDOMONAS AERUGINOSA (A)  Final      Susceptibility   Pseudomonas aeruginosa - MIC*    CEFTAZIDIME >=64 RESISTANT Resistant     CIPROFLOXACIN 0.5 SENSITIVE Sensitive     GENTAMICIN <=1 SENSITIVE Sensitive     IMIPENEM 2 SENSITIVE Sensitive     * >=100,000 COLONIES/mL PSEUDOMONAS AERUGINOSA  Culture, blood (routine x 2)     Status: None (Preliminary result)   Collection Time: 02/25/21  8:25 PM   Specimen: BLOOD LEFT WRIST  Result Value Ref Range Status   Specimen Description   Final    BLOOD LEFT WRIST Performed at [place] [address] [place], [place] [address]    Special Requests   Final    BOTTLES DRAWN AEROBIC AND ANAEROBIC Blood Culture adequate volume Performed at [facility] [place] [address], [place],  [address]    Culture   Final  NO GROWTH 3 DAYS Performed at [facility], [place] [address], [place], [place] [address]    Report Status PENDING  Incomplete  Culture, blood (routine x 2)     Status: None (Preliminary result)   Collection Time: 02/25/21  8:30 PM   Specimen: BLOOD  Result Value Ref Range Status   Specimen Description   Final    BLOOD LEFT ANTECUBITAL Performed at [facility] [address], [place], [place] [address]    Special Requests   Final    BOTTLES DRAWN AEROBIC AND ANAEROBIC Blood Culture adequate volume Performed at [facility] [address], [place], [place] [address]    Culture   Final    NO GROWTH 3 DAYS Performed at [facility], [place] [address] [place], [place] [address]    Report Status PENDING  Incomplete   Resp Panel by RT-PCR (Flu A&B, Covid) Nasopharyngeal Swab     Status: Abnormal   Collection Time: 02/25/21 11:45 PM   Specimen: Nasopharyngeal Swab; Nasopharyngeal(NP) swabs in vial transport medium  Result Value Ref Range Status   SARS Coronavirus 2 by RT PCR POSITIVE (A) NEGATIVE Final    Comment: RESULT CALLED TO, READ BACK BY AND VERIFIED WITH: SMITH J.03.03.22 @ 0124 BY MECIAL MECIAL J. (NOTE) SARS-CoV-2 target nucleic acids are DETECTED.  The SARS-CoV-2 RNA is generally detectable in upper respiratory specimens during the acute phase of infection. Positive results are indicative of the presence of the identified virus, but do not rule out bacterial infection or co-infection with other pathogens not detected by the test. Clinical correlation with patient history and other diagnostic information is necessary to determine patient infection status. The expected result is Negative.  Fact Sheet for Patients: EntrepreneurPulse.com.au  Fact Sheet for Healthcare Providers: IncredibleEmployment.be  This test is not yet approved or cleared by the [place] FDA and  has been authorized for detection and/or diagnosis of SARS-CoV-2 by FDA under an Emergency Use Authorization (EUA).  This EUA will remain in effect (meaning this t est can be used) for the duration of  the COVID-19 declaration under Section 564(b)(1) of the Act, 21 U.S.C. section 360bbb-3(b)(1), unless the authorization is terminated or revoked sooner.     Influenza A by PCR NEGATIVE NEGATIVE Final   Influenza B by PCR NEGATIVE NEGATIVE Final    Comment: (NOTE) The Xpert Xpress SARS-CoV-2/FLU/RSV plus assay is intended as an aid in the diagnosis of influenza from Nasopharyngeal swab specimens and should not be used as a sole basis for treatment. Nasal washings and aspirates are unacceptable for Xpert Xpress SARS-CoV-2/FLU/RSV testing.  Fact Sheet for  Patients: EntrepreneurPulse.com.au  Fact Sheet for Healthcare Providers: IncredibleEmployment.be  This test is not yet approved or cleared by the [place] FDA and has been authorized for detection and/or diagnosis of SARS-CoV-2 by FDA under an Emergency Use Authorization (EUA). This EUA will remain in effect (meaning this test can be used) for the duration of the COVID-19 declaration under Section 564(b)(1) of the Act, 21 U.S.C. section 360bbb-3(b)(1), unless the authorization is terminated or revoked.  Performed at [facility], [place] [address], [place] [address]   MRSA PCR Screening     Status: None   Collection Time: 02/28/21 12:28 AM   Specimen: Nasal Mucosa; Nasopharyngeal  Result Value Ref Range Status   MRSA by PCR NEGATIVE NEGATIVE Final    Comment:        The GeneXpert MRSA Assay (  FDA approved for NASAL specimens only), is one component of a comprehensive MRSA colonization surveillance program. It is not intended to diagnose MRSA infection nor to guide or monitor treatment for MRSA infections. Performed at [facility], [place] [address] [place], [place] [address]          Radiology Studies: No results found.      Scheduled Meds: . aspirin EC  81 mg Oral Daily  . atorvastatin  10 mg Oral Daily  . azaTHIOprine  50 mg Oral Daily  . B-complex with vitamin C  1 tablet Oral Daily  . carbamide peroxide  5 drop Right EAR BID  . Chlorhexidine Gluconate Cloth  6 each Topical Daily  . cholestyramine light  4 g Oral Q12H  . ciprofloxacin  500 mg Oral BID  . collagenase   Topical Daily  . cycloSPORINE  75 mg Oral BID  . enoxaparin (LOVENOX) injection  30 mg Subcutaneous Q24H  . free water  150 mL Per Tube Q4H  . insulin aspart  0-15 Units Subcutaneous Q4H  . insulin aspart  3 Units Subcutaneous TID WC  . insulin glargine  6 Units Subcutaneous BID  . levothyroxine  88 mcg  Oral QAC breakfast  . pantoprazole  40 mg Oral Daily  . predniSONE  5 mg Oral Q breakfast  . saccharomyces boulardii  250 mg Oral BID  . sertraline  25 mg Oral Daily   Continuous Infusions: . feeding supplement (GLUCERNA 1.5 CAL) 1,000 mL (03/01/21 0145)     LOS: 5 days   Georgette Shell, MD 03/02/2021, 9:52 AM

## 2021-03-02 NOTE — Plan of Care (Signed)

## 2021-03-02 NOTE — Plan of Care (Signed)
  Problem: Education: Goal: Knowledge of General Education information will improve Description: Including pain rating scale, medication(s)/side effects and non-pharmacologic comfort measures 03/02/2021 1848 by Zadie Rhine, RN Outcome: Progressing 03/02/2021 1842 by Zadie Rhine, RN Outcome: Progressing   Problem: Clinical Measurements: Goal: Ability to maintain clinical measurements within normal limits will improve 03/02/2021 1848 by Zadie Rhine, RN Outcome: Progressing 03/02/2021 1842 by Zadie Rhine, RN Outcome: Progressing Goal: Will remain free from infection Outcome: Progressing

## 2021-03-03 LAB — CULTURE, BLOOD (ROUTINE X 2)
Culture: NO GROWTH
Culture: NO GROWTH
Special Requests: ADEQUATE
Special Requests: ADEQUATE

## 2021-03-03 LAB — COMPREHENSIVE METABOLIC PANEL
ALT: 17 U/L (ref 0–44)
AST: 23 U/L (ref 15–41)
Albumin: 2.1 g/dL — ABNORMAL LOW (ref 3.5–5.0)
Alkaline Phosphatase: 76 U/L (ref 38–126)
Anion gap: 12 (ref 5–15)
BUN: 46 mg/dL — ABNORMAL HIGH (ref 8–23)
CO2: 21 mmol/L — ABNORMAL LOW (ref 22–32)
Calcium: 9.5 mg/dL (ref 8.9–10.3)
Chloride: 105 mmol/L (ref 98–111)
Creatinine, Ser: 0.62 mg/dL (ref 0.44–1.00)
GFR, Estimated: >60 mL/min (ref >60)
Glucose, Bld: 53 mg/dL — ABNORMAL LOW (ref 70–99)
Potassium: 4.8 mmol/L (ref 3.5–5.1)
Sodium: 138 mmol/L (ref 135–145)
Total Bilirubin: 0.6 mg/dL (ref 0.3–1.2)
Total Protein: 5.6 g/dL — ABNORMAL LOW (ref 6.5–8.1)

## 2021-03-03 LAB — CBC
HCT: 32.4 % — ABNORMAL LOW (ref 36.0–46.0)
Hemoglobin: 9.8 g/dL — ABNORMAL LOW (ref 12.0–15.0)
MCH: 31 pg (ref 26.0–34.0)
MCHC: 30.2 g/dL (ref 30.0–36.0)
MCV: 102.5 fL — ABNORMAL HIGH (ref 80.0–100.0)
Platelets: 344 10*3/uL (ref 150–400)
RBC: 3.16 MIL/uL — ABNORMAL LOW (ref 3.87–5.11)
RDW: 19.6 % — ABNORMAL HIGH (ref 11.5–15.5)
WBC: 4.9 10*3/uL (ref 4.0–10.5)
nRBC: 0 % (ref 0.0–0.2)

## 2021-03-03 LAB — GLUCOSE, CAPILLARY
Glucose-Capillary: 50 mg/dL — ABNORMAL LOW (ref 70–99)
Glucose-Capillary: 72 mg/dL (ref 70–99)
Glucose-Capillary: 108 mg/dL — ABNORMAL HIGH (ref 70–99)
Glucose-Capillary: 179 mg/dL — ABNORMAL HIGH (ref 70–99)

## 2021-03-03 MED ORDER — INSULIN GLARGINE 100 UNIT/ML ~~LOC~~ SOLN: 6.0000 [IU] | Freq: Every day | SUBCUTANEOUS | 11 refills | Status: DC

## 2021-03-03 MED ORDER — INSULIN GLARGINE 100 UNIT/ML ~~LOC~~ SOLN: 5.0000 [IU] | Freq: Every day | SUBCUTANEOUS | Status: DC

## 2021-03-03 MED ORDER — INSULIN GLARGINE 100 UNIT/ML ~~LOC~~ SOLN: 5.0000 [IU] | Freq: Every day | SUBCUTANEOUS | 11 refills | Status: DC

## 2021-03-03 MED ORDER — INSULIN ASPART 100 UNIT/ML ~~LOC~~ SOLN: 0.0000 [IU] | Freq: Three times a day (TID) | SUBCUTANEOUS | Status: DC

## 2021-03-03 NOTE — Progress Notes (Signed)
Hypoglycemic Event  CBG: 50     Treatment: 4 oz juice/soda  Symptoms: None  Follow-up CBG: Time: 0440 CBG Result: 72  Possible Reasons for Event: Unknown  Comments/MD notified: Will continue to monitor.     Cannon Kettle

## 2021-03-03 NOTE — Progress Notes (Addendum)
Pt Discharged to home, instructions reviewed with husband and pt. Husband aware of care of PEG and the plan for removal in a few weeks, husband acknowledged understanding. 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 Husband aware of pt d/c with foley in place and acknowledge understanding of catheter care. Care instructions sent with d/c papers. SRP, RN

## 2021-03-03 NOTE — Plan of Care (Signed)

## 2021-03-03 NOTE — Discharge Summary (Addendum)
Physician Discharge Summary  [name] CNO:[id number] DOB: 12/17/1961 DOA: 02/25/2021  PCP: Doris Croak, MD  Admit date: 02/25/2021 Discharge date: 03/03/2021  Admitted From: Home Disposition: Home  Recommendations for Outpatient Follow-up:  1. Follow up with PCP in 1-2 weeks 2. Please obtain BMP/CBC in one week 3. Follow-up with ENT for new onset deafness 4. Follow-up with interventional radiology to remove the PEG tube in 2 to 4 weeks. 5.   discharge with PEG and foley.    Home Health: PT OT and nurse Equipment/Devices: None Discharge Condition stable  CODE STATUS: DO NOT RESUSCITATE  diet recommendation heart healthy carb modified Brief/Interim Summary: 60 yo female DM TYPE I FTT HTN R BKA RENAL TRANSPLANT on immunosuppressants.  History obtained from patient's husband and ER records.  Multiple hospital admissions in the last 6 months reviewed in the chart. 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  She was sent to [place] home where she started to have nausea vomiting and diarrhea per husband she had food poisoning and was sent to [facility] long hospital for 1 week and was sent out to [facility] again for 3 weeks approximately she had an infected bedsore she was again sent back to [facility] and then discharged to [place] at [facility] for 1 week she was again admitted to [facility] for dehydration/sepsis and sacral infected decubitus with osteomyelitis PICC line placed IV antibiotics given.  Then discharged to [facility] from where she was discharged to [facility] where she was treated with antibiotics for questionable source of infection and then she was discharged to home.  Per husband he was going to take care of her at home with home health.   However patient was more in the state of unresponsiveness and he was not able to care for her. Per husband patient's baseline she was awake alert talking walking prior to amputation.  She had a PEG tube placed at [place].  She got diagnosed with Covid in [facility] around January 30. He wants [place] to do everything possible for her to get her back to her baseline.  However he did agree to DNR/DNI. In the ED CT of the abdomen and pelvis shows possible acute cholecystitis and large gallbladder.  Normal LFTs and white count.  However her blood pressure was low at 85 in the ED.  T-max was 99 heart rate was 102.  Patient is on multiple immunosuppressants including cyclosporine Imuran and prednisone.  Discharge Diagnoses:  Principal Problem:   Acute metabolic encephalopathy Active Problems:   S/p cadaver renal transplant   Sacral decubitus ulcer, stage IV (HCC)   Type 1 diabetes mellitus with hyperlipidemia (HCC)   Unspecified severe protein-calorie malnutrition (HCC)   Acute lower UTI   Anemia    # 1 Acute Toxic metabolic encephalopathy-this was  thought to be secondary to multifactorial etiologies including Pseudomonas UTI  SENSITIVIve to cipro, SEVERE ANEMIA  stage IV sacral decubitus, possible acute cholecystitis .Per husband her baseline mental status is she is awake and alert and answer questions appropriately. Since her amputation of the right lower extremity in October 2021 she has been deteriorating. She has been in and out of hospitals and nursing home since then. CT of the abdomen and pelvis this admission does not reveal any  evidence of osteomyelitis in the sacral region. DCd Zosyn and started ciprofloxacin 3/6 urine culture grew more than 100,000 colonies of Pseudomonas.  #2 SIRS patient met SIRS criteria on admission with hypotension systolic blood pressure less than 90 with tachycardia with a heart rate above 100.  Lactic level was normal white count was normal though she is  immunosuppressed from chronic prednisone Imuran and cyclosporine.  #3 anemia of chronic disease hemoglobin 6.9 she got 2 units of blood transfusion hemoglobin up to 10.2.   FOBT NEGATIVE.  Hemoglobin has been stable.  #4Stage 4 sacral decubitus and stage II left heel-POA.  Seen by wound care .  Cleanse sacral wound with normal saline and pat dry.  Line wound bed with Aquacel Ag failure wound depth with fluffed gauze.  Top with dry gauze and ABD pads and tape.  Change every other day.  Silicone foam to left heel wound change every other day.  #5ACUTE cholecystitis by CT scan.  Ultrasound shows distended gallbladder containing stones with trace pericholecystic fluid but normal wall thickness.  Patient is asymptomatic.  Common bile duct diameter upper normal.  Normal wbc and normal Lfts   #6 s/p renal transplant -on imuran solumedrol and cyclosporine  #7 hypoalbumenemia dietary consulted Albumin 1.9.  Restarted tube feedings.  Patient and family is adamant about getting the PEG tube out.  Staff reports she is eating 75 to 90% of her food.  Follow-up with interventional radiology in 2 to 4 weeks to take the PEG tube out.  #8 hypothyroidsm continue Synthroid.  #9type 1 DM hemoglobin A1c of 6.9 she is status post pancreatic transplant.  Continue Lantus and NovoLog.Marland Kitchen   #10 R BKA site looks clean dry and intact.  # 11 CKD STAGE 3 A creatinine stable.  At baseline.  #13 urinary retention Foley was placed initially on January 21.  Will need to continue Foley on discharge.  She has stage IV sacral decubitus ulcer.  Follow-up with urology.  #14 goals of care discussed in detail with patient's husband.  He makes decisions for his wife when she cannot.  He has changed her CODE STATUS to DNR/DNI.With multiple comorbidities and poor functional status poor nutritional status her long-term prognosis is extremely poor.  This was discussed with her husband [name].  #15 she was tested Covid positive on  January 30 at [facility] she has recovered from [facility] and it has been more than 21 days will DC precautions.  Discussed with ID.  #16 status post PEG tube this was placed in January 2022 with [facility].  Patient and husband want the tube removed since patient is eating well.  I discussed with IR who recommends having the tube in at least 8 weeks prior to taking it out.  She will have to follow-up with IR after discharge.  #17 deafness patient and family reports this is fairly new.  Mild improvement with Debrox eardrops and cleaning the ear out.  There was minimal amount of wax in both ears.  She will need outpatient ENT follow-up.  Pressure Injury 12/10/20 Heel Left;Posterior Stage 2 -  Partial thickness loss of dermis presenting as a shallow open injury with a red, pink wound bed without slough. (Active)  12/10/20 1325  Location: Heel  Location Orientation: Left;Posterior  Staging: Stage 2 -  Partial thickness loss of dermis presenting as a shallow open injury with a red, pink wound bed without slough.  Wound Description (Comments):   Present on Admission: Yes  Pressure Injury 12/24/20 Sacrum Stage 4 - Full thickness tissue loss with exposed bone, tendon or muscle. (Active)  12/24/20   Location: Sacrum  Location Orientation:   Staging: Stage 4 - Full thickness tissue loss with exposed bone, tendon or muscle.  Wound Description (Comments):   Present on Admission: Yes     Pressure Injury 02/26/21 Buttocks Right Stage 2 -  Partial thickness loss of dermis presenting as a shallow open injury with a red, pink wound bed without slough. (Active)  02/26/21 0600  Location: Buttocks  Location Orientation: Right  Staging: Stage 2 -  Partial thickness loss of dermis presenting as a shallow open injury with a red, pink wound bed without slough.  Wound Description (Comments):   Present on Admission:       Nutrition Problem: Increased nutrient needs Etiology: acute illness,wound  healing    Signs/Symptoms: estimated needs     Interventions: Tube feeding,Juven,Prostat  Estimated body mass index is 17.94 kg/m as calculated from the following:   Height as of this encounter: 5' 3.6" (1.615 m).   Weight as of this encounter: 46.8 kg.  Discharge Instructions  Discharge Instructions    Ambulatory referral to ENT   Complete by: As directed    Ambulatory referral to Interventional Radiology   Complete by: As directed    DC PEG tube in 2 to 4 weeks     Allergies as of 03/03/2021   No Known Allergies     Medication List    STOP taking these medications   aluminum-magnesium hydroxide-simethicone 213-086-57 MG/5ML Susp Commonly known as: MAALOX   docusate sodium 100 MG capsule Commonly known as: COLACE   enoxaparin 30 MG/0.3ML injection Commonly known as: LOVENOX   HYDROcodone-acetaminophen 5-325 MG tablet Commonly known as: NORCO/VICODIN   metoprolol tartrate 25 MG tablet Commonly known as: LOPRESSOR   miconazole 2 % powder Commonly known as: MICOTIN   sodium bicarbonate 650 MG tablet   Sodium Hypochlorite 0.25 % Soln   temazepam 15 MG capsule Commonly known as: RESTORIL     TAKE these medications   acetaminophen 325 MG tablet Commonly known as: TYLENOL Take 2 tablets (650 mg total) by mouth every 6 (six) hours as needed for mild pain (or Fever >/= 101). What changed: reasons to take this   aspirin EC 81 MG tablet Take 81 mg by mouth daily. Swallow whole.   atorvastatin 10 MG tablet Commonly known as: LIPITOR Take 1 tablet (10 mg total) by mouth daily.   azaTHIOprine 50 MG tablet Commonly known as: IMURAN Take 1 tablet (50 mg total) by mouth daily.   B Complex-C-Folic Acid Tabs Take 1 tablet by mouth daily.   carbamide peroxide 6.5 % OTIC solution Commonly known as: DEBROX Place 5 drops into the right ear 2 (two) times daily.   cholestyramine 4 g packet Commonly known as: QUESTRAN Take 1 packet (4 g total) by mouth 2  (two) times daily.   ciprofloxacin 500 MG tablet Commonly known as: CIPRO Take 1 tablet (500 mg total) by mouth 2 (two) times daily.   collagenase ointment Commonly known as: SANTYL Apply topically daily.   cycloSPORINE 25 MG capsule Commonly known as: SANDIMMUNE Take 3 capsules (75 mg total) by mouth 2 (two) times daily.   insulin glargine 100 UNIT/ML injection Commonly known as: LANTUS Inject 0.05 mLs (5 Units total) into the skin at bedtime. What changed:   how much to take  when to take this   insulin lispro 100 UNIT/ML  injection Commonly known as: HUMALOG Inject 0.02 mLs (2 Units total) into the skin 3 (three) times daily before meals. Sliding scale if 200-250=2 units;251-300=4units;301-350=6 units;351-400=8 units;104-450=10 units;451-500=12 units subcutaneously before meals and at bedtime for DM   levothyroxine 88 MCG tablet Commonly known as: SYNTHROID Take 1 tablet (88 mcg total) by mouth daily before breakfast.   liver oil-zinc oxide 40 % ointment Commonly known as: DESITIN Apply topically as needed for irritation.   loperamide 2 MG capsule Commonly known as: IMODIUM Take 1 capsule (2 mg total) by mouth as needed for diarrhea or loose stools.   ondansetron 4 MG disintegrating tablet Commonly known as: ZOFRAN-ODT Take 1 tablet (4 mg total) by mouth every 4 (four) hours as needed for nausea or vomiting.   oxyCODONE 5 MG immediate release tablet Commonly known as: Oxy IR/ROXICODONE Take 1 tablet (5 mg total) by mouth every 6 (six) hours as needed for severe pain.   pantoprazole 40 MG tablet Commonly known as: PROTONIX Take 1 tablet (40 mg total) by mouth daily.   predniSONE 5 MG tablet Commonly known as: DELTASONE Take 1 tablet (5 mg total) by mouth daily with breakfast.   saccharomyces boulardii 250 MG capsule Commonly known as: FLORASTOR Take 1 capsule (250 mg total) by mouth 2 (two) times daily.   sertraline 25 MG tablet Commonly known as:  ZOLOFT Take 1 tablet (25 mg total) by mouth daily.       Follow-up Information    Doris Croak, MD Follow up.   Specialty: Family Medicine Contact information: [place] [address] [phone]        [facility] Follow up.   Why: agency will provide home health nurse, physical, and occupational therapy Contact information: [place] [address] [phone]        [name], MD Follow up.   Specialty: Otolaryngology Contact information: [place] [address] [phone]        [place] Follow up in 2 week(s).   Why: Office will call you to schedule PEG removal procedure  Contact information: [place] [address] [phone]              No Known Allergies  Consultations:  None   Procedures/Studies: DG Chest 1 View  Result Date: 02/25/2021 CLINICAL DATA:  Stage IV decubitus sacral ulcer, diabetes, altered level of consciousness EXAM: CHEST  1 VIEW COMPARISON:  01/18/2021 FINDINGS: Single frontal view of the chest demonstrates a stable cardiac silhouette. There is streaky consolidation at the left lung base which may reflect atelectasis or airspace disease. No effusion or pneumothorax. No acute bony abnormalities. Prior healed right rib fractures. IMPRESSION: 1. Streaky left basilar consolidation consistent with atelectasis or airspace disease. Electronically Signed   By: Randa Ngo M.D.   On: 02/25/2021 20:04   CT Head Wo Contrast  Result Date: 02/25/2021 CLINICAL DATA:  Mental status change EXAM: CT HEAD WITHOUT CONTRAST TECHNIQUE: Contiguous axial images were obtained from the base of the skull through the vertex without intravenous contrast. COMPARISON:  MRI 01/23/2021, CT 01/18/2021 FINDINGS: Brain: No acute territorial infarction, hemorrhage or intracranial mass. Moderate atrophy. Moderate hypodensity in the white  matter consistent with chronic small vessel ischemic change. Stable ventricle size. Vascular: No hyperdense vessels. Vertebral and carotid vascular calcification Skull: Normal. Negative for fracture or focal lesion. Sinuses/Orbits: Bilateral mastoid effusions. Other: None IMPRESSION: 1. No CT  evidence for acute intracranial abnormality. 2. Atrophy and chronic small vessel ischemic changes of the white matter. 3. Bilateral mastoid effusion Electronically Signed   By: Donavan Foil M.D.   On: 02/25/2021 22:26   CT Abdomen Pelvis W Contrast  Addendum Date: 02/25/2021   ADDENDUM REPORT: 02/25/2021 22:47 ADDENDUM: Slightly dilated extrahepatic common bile duct, recommend correlation with LFTs with follow-up MRCP as indicated Electronically Signed   By: Donavan Foil M.D.   On: 02/25/2021 22:47   Result Date: 02/25/2021 CLINICAL DATA:  Abdominal pain sacral ulcer EXAM: CT ABDOMEN AND PELVIS WITH CONTRAST TECHNIQUE: Multidetector CT imaging of the abdomen and pelvis was performed using the standard protocol following bolus administration of intravenous contrast. CONTRAST:  153m OMNIPAQUE IOHEXOL 300 MG/ML  SOLN COMPARISON:  CT 12/07/2020, ultrasound 01/21/2021, 10/29/2020 FINDINGS: Lower chest: Lung bases demonstrate partial consolidations within the bilateral lower lobes. Cardiomegaly with coronary vascular calcification. Circumferential distal esophageal thickening. Hepatobiliary: No focal hepatic abnormality. Markedly distended gallbladder containing stones. Extrahepatic common bile duct slightly enlarged at 8 mm at the head of pancreas. Pancreas: Mild pancreatic ductal dilatation. No inflammatory changes. Spleen: Normal in size without focal abnormality. Adrenals/Urinary Tract: Adrenal glands are normal. Atrophic native kidneys without hydronephrosis. Transplanted kidney within the pelvis slightly to the left of midline. Cyst in the lower pole of the transplanted kidney. No hydronephrosis. Urinary bladder obscured  by artifact from right hip hardware. There is a Foley catheter in the bladder. Bladder appears thick walled. Stomach/Bowel: Gastrostomy tube within the body of the stomach. No dilated small bowel. No acute bowel wall thickening Vascular/Lymphatic: Extensive aortic atherosclerosis. 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 No suspicious nodes. Reproductive: No obvious adnexal mass Other: No free air. There is free fluid in the pelvis. There is generalized anasarca. Musculoskeletal: Deep sacral decubitus ulcer without drainable abscess or definitive osseous destructive change. IMPRESSION: 1. Deep sacral decubitus ulcer without drainable soft tissue abscess or definitive osseous destructive change of the distal sacrum or coccyx. 2. Markedly distended gallbladder containing stones. Ultrasound correlation if concern for acute gallbladder disease. 3. Partial consolidations within the bilateral lower lobes, atelectasis versus pneumonia. 4. Atrophic native kidneys. Transplanted kidney within the pelvis slightly to the left of midline without definitive hydronephrosis. Possible thick-walled appearance of the urinary bladder, question cystitis. 5. Small amount of free fluid in the pelvis. Generalized anasarca. 6. Circumferential distal esophageal thickening, could be secondary to reflux or esophagitis. Aortic Atherosclerosis (ICD10-I70.0). Electronically Signed: By: KDonavan FoilM.D. On: 02/25/2021 22:40   [place]domen Limited RUQ (LIVER/GB)  Result Date: 02/25/2021 CLINICAL DATA:  Distended gallbladder abnormal CT EXAM: ULTRASOUND ABDOMEN LIMITED RIGHT UPPER QUADRANT COMPARISON:  CT 02/25/2021 FINDINGS: Gallbladder: Multiple shadowing stones measuring up to 3.2 cm. Normal wall thickness. Unable to reliably assess sonographic [place]. Trace pericholecystic fluid. Common bile duct: Diameter: 7 mm Liver: No focal lesion identified. Within normal limits in parenchymal echogenicity. Portal vein is  patent on color Doppler imaging with normal direction of blood flow towards the liver. Other: None. IMPRESSION: 1. Distended gallbladder containing stones. There is trace pericholecystic fluid but normal wall thickness. Unable to reliably assess sonographic [place]. Findings are indeterminate for cholecystitis; correlation with nuclear medicine hepatobiliary could be obtained. 2. Common bile duct diameter upper normal, correlate with LFTs Electronically Signed   By: KDonavan FoilM.D.   On: 02/25/2021 23:36      Subjective: No new complaints.   Discharge Exam: Vitals:   03/02/21 2026 03/03/21 06659  BP: 140/69 130/69  Pulse: 78 76  Resp: 16 16  Temp: 98.1 F (36.7 C) 98.4 F (36.9 C)  SpO2: 100% 99%   Vitals:   03/02/21 1246 03/02/21 2026 03/03/21 0500 03/03/21 0604  BP: 127/69 140/69  130/69  Pulse: 82 78  76  Resp: _0 Temp: 98.5 F (36.9 C) 98.1 F (36.7 C)  98.4 F (36.9 C)  TempSrc: Oral   Oral  SpO2: 100% 100%  99%  Weight:   46.8 kg   Height:        General: Pt is alert, awake, not in acute distress Cardiovascular: RRR, S1/S2 +, no rubs, no gallops Respiratory: CTA bilaterally, no wheezing, no rhonchi Abdominal: Soft, NT, ND, bowel sounds + PEG tube in place Extremities: Right BKA  The results of significant diagnostics from this hospitalization (including imaging, microbiology, ancillary and laboratory) are listed below for reference.     Microbiology: Recent Results (from the past 240 hour(s))  Urine culture     Status: Abnormal   Collection Time: 02/25/21  7:40 PM   Specimen: Urine, Clean Catch  Result Value Ref Range Status   Specimen Description   Final    URINE, CLEAN CATCH Performed at [facility], [place] [address] [place], [place] [address]    Special Requests   Final    NONE Performed at [facility], [place] [address], [place], [place] [address]    Culture >=100,000 COLONIES/mL PSEUDOMONAS AERUGINOSA (A)   Final   Report Status 02/28/2021 FINAL  Final   Organism ID, Bacteria PSEUDOMONAS AERUGINOSA (A)  Final      Susceptibility   Pseudomonas aeruginosa - MIC*    CEFTAZIDIME >=64 RESISTANT Resistant     CIPROFLOXACIN 0.5 SENSITIVE Sensitive     GENTAMICIN <=1 SENSITIVE Sensitive     IMIPENEM 2 SENSITIVE Sensitive     * >=100,000 COLONIES/mL PSEUDOMONAS AERUGINOSA  Culture, blood (routine x 2)     Status: None (Preliminary result)   Collection Time: 02/25/21  8:25 PM   Specimen: BLOOD LEFT WRIST  Result Value Ref Range Status   Specimen Description   Final    BLOOD LEFT WRIST Performed at [place] [address], [place], [place] [address]    Special Requests   Final    BOTTLES DRAWN AEROBIC AND ANAEROBIC Blood Culture adequate volume Performed at [place] [address] [place], [place] [address]    Culture   Final    NO GROWTH 4 DAYS Performed at [facility], [place] [address] [place], [place] [address]    Report Status PENDING  Incomplete  Culture, blood (routine x 2)     Status: None (Preliminary result)   Collection Time: 02/25/21  8:30 PM   Specimen: BLOOD  Result Value Ref Range Status   Specimen Description   Final    BLOOD LEFT ANTECUBITAL Performed at [place] [address] [place], [place] [address]    Special Requests   Final    BOTTLES DRAWN AEROBIC AND ANAEROBIC Blood Culture adequate volume Performed at [place] [address], [place], [place] [address]    Culture   Final    NO GROWTH 4 DAYS Performed at [facility], [place] [address], [place], [place] [address]    Report Status PENDING  Incomplete  Resp Panel by RT-PCR (Flu A&B, Covid) Nasopharyngeal Swab     Status:  Abnormal   Collection Time: 02/25/21 11:45 PM   Specimen: Nasopharyngeal Swab; Nasopharyngeal(NP) swabs in vial transport medium  Result Value Ref Range Status   SARS Coronavirus 2 by  RT PCR POSITIVE (A) NEGATIVE Final    Comment: RESULT CALLED TO, READ BACK BY AND VERIFIED WITH: SMITH J.03.03.22 @ 0124 BY MECIAL MECIAL J. (NOTE) SARS-CoV-2 target nucleic acids are DETECTED.  The SARS-CoV-2 RNA is generally detectable in upper respiratory specimens during the acute phase of infection. Positive results are indicative of the presence of the identified virus, but do not rule out bacterial infection or co-infection with other pathogens not detected by the test. Clinical correlation with patient history and other diagnostic information is necessary to determine patient infection status. The expected result is Negative.  Fact Sheet for Patients: EntrepreneurPulse.com.au  Fact Sheet for Healthcare Providers: IncredibleEmployment.be  This test is not yet approved or cleared by the [place] FDA and  has been authorized for detection and/or diagnosis of SARS-CoV-2 by FDA under an Emergency Use Authorization (EUA).  This EUA will remain in effect (meaning this t est can be used) for the duration of  the COVID-19 declaration under Section 564(b)(1) of the Act, 21 U.S.C. section 360bbb-3(b)(1), unless the authorization is terminated or revoked sooner.     Influenza A by PCR NEGATIVE NEGATIVE Final   Influenza B by PCR NEGATIVE NEGATIVE Final    Comment: (NOTE) The Xpert Xpress SARS-CoV-2/FLU/RSV plus assay is intended as an aid in the diagnosis of influenza from Nasopharyngeal swab specimens and should not be used as a sole basis for treatment. Nasal washings and aspirates are unacceptable for Xpert Xpress SARS-CoV-2/FLU/RSV testing.  Fact Sheet for Patients: EntrepreneurPulse.com.au  Fact Sheet for Healthcare Providers: IncredibleEmployment.be  This test is not yet approved or cleared by the [place] FDA and has been authorized for detection and/or diagnosis of SARS-CoV-2 by FDA under  an Emergency Use Authorization (EUA). This EUA will remain in effect (meaning this test can be used) for the duration of the COVID-19 declaration under Section 564(b)(1) of the Act, 21 U.S.C. section 360bbb-3(b)(1), unless the authorization is terminated or revoked.  Performed at [facility], [place] [address], [place], [place] [address]   MRSA PCR Screening     Status: None   Collection Time: 02/28/21 12:28 AM   Specimen: Nasal Mucosa; Nasopharyngeal  Result Value Ref Range Status   MRSA by PCR NEGATIVE NEGATIVE Final    Comment:        The GeneXpert MRSA Assay (FDA approved for NASAL specimens only), is one component of a comprehensive MRSA colonization surveillance program. It is not intended to diagnose MRSA infection nor to guide or monitor treatment for MRSA infections. Performed at [facility], [place] [address] [place], [place] [address]      Labs: BNP (last 3 results) No results for input(s): BNP in the last 8760 hours. Basic Metabolic Panel: Recent Labs  Lab 02/26/21 1306 02/26/21 1638 02/26/21 2051 02/27/21 0341 02/27/21 1612 02/28/21 0409 03/01/21 0351 03/02/21 0424 03/03/21 0427  NA 140  --   --  134*  --  137 137 136 138  K 4.7  --   --  3.2*  --  3.0* 5.4* 4.9 4.8  CL 108  --   --  104  --  103 107 104 105  CO2 20*  --   --  18*  --  _0 21*  GLUCOSE 245*  --    < >  465*  --  282* 201* 117* 53*  BUN 35*  --   --  45*  --  37* 46* 49* 46*  CREATININE 0.73  --   --  1.00  --  0.83 0.96 0.77 0.62  CALCIUM 9.4  --   --  9.2  --  9.0 9.0 9.1 9.5  MG 1.9 1.9  --  1.8 1.8 1.8  --   --   --   PHOS 4.2 4.3  --  3.4 2.9  --   --   --   --    < > = values in this interval not displayed.   Liver Function Tests: Recent Labs  Lab 02/27/21 0341 02/28/21 0409 03/01/21 0351 03/02/21 0424 03/03/21 0427  AST _0 ALT _1 ALKPHOS 93 80 74 75 76  BILITOT 0.9 0.5 0.5 0.8 0.6  PROT 5.5* 5.3* 5.4*  5.2* 5.6*  ALBUMIN 2.0* 1.8* 2.0* 1.9* 2.1*   No results for input(s): LIPASE, AMYLASE in the last 168 hours. No results for input(s): AMMONIA in the last 168 hours. CBC: Recent Labs  Lab 02/25/21 2000 02/26/21 1306 02/27/21 0341 02/28/21 0409 03/01/21 0351 03/02/21 0424 03/03/21 0427  WBC 7.9   < > 6.8 7.5 5.8 6.5 4.9  NEUTROABS 4.6  --   --   --   --   --   --   HGB 6.9*   < > 9.9* 10.0* 10.7* 10.2* 9.8*  HCT 21.3*   < > 31.4* 31.4* 34.8* 32.6* 32.4*  MCV 103.4*   < > 99.1 98.7 101.8* 101.2* 102.5*  PLT 403*   < > 369 388 385 380 344   < > = values in this interval not displayed.   Cardiac Enzymes: No results for input(s): CKTOTAL, CKMB, CKMBINDEX, TROPONINI in the last 168 hours. BNP: Invalid input(s): POCBNP CBG: Recent Labs  Lab 03/02/21 1650 03/02/21 2023 03/02/21 2355 03/03/21 0414 03/03/21 0440  GLUCAP 64* 241* 149* 50* 72   D-Dimer No results for input(s): DDIMER in the last 72 hours. Hgb A1c No results for input(s): HGBA1C in the last 72 hours. Lipid Profile No results for input(s): CHOL, HDL, LDLCALC, TRIG, CHOLHDL, LDLDIRECT in the last 72 hours. Thyroid function studies No results for input(s): TSH, T4TOTAL, T3FREE, THYROIDAB in the last 72 hours.  Invalid input(s): FREET3 Anemia work up No results for input(s): VITAMINB12, FOLATE, FERRITIN, TIBC, IRON, RETICCTPCT in the last 72 hours. Urinalysis    Component Value Date/Time   COLORURINE AMBER (A) 02/25/2021 1940   APPEARANCEUR TURBID (A) 02/25/2021 1940   LABSPEC 1.011 02/25/2021 1940   PHURINE 6.0 02/25/2021 1940   GLUCOSEU NEGATIVE 02/25/2021 1940   HGBUR MODERATE (A) 02/25/2021 1940   BILIRUBINUR NEGATIVE 02/25/2021 [place] NEGATIVE 02/25/2021 1940   PROTEINUR 100 (A) 02/25/2021 1940   NITRITE NEGATIVE 02/25/2021 1940   LEUKOCYTESUR LARGE (A) 02/25/2021 1940   Sepsis Labs Invalid input(s): PROCALCITONIN,  WBC,  LACTICIDVEN Microbiology Recent Results (from the past 240  hour(s))  Urine culture     Status: Abnormal   Collection Time: 02/25/21  7:40 PM   Specimen: Urine, Clean Catch  Result Value Ref Range Status   Specimen Description   Final    URINE, CLEAN CATCH Performed at [facility], [place] [address] [place], [place] [address]    Special Requests   Final    NONE Performed at [facility],  [place] [address], [place], [place] [address]    Culture >=100,000 COLONIES/mL PSEUDOMONAS AERUGINOSA (A)  Final   Report Status 02/28/2021 FINAL  Final   Organism ID, Bacteria PSEUDOMONAS AERUGINOSA (A)  Final      Susceptibility   Pseudomonas aeruginosa - MIC*    CEFTAZIDIME >=64 RESISTANT Resistant     CIPROFLOXACIN 0.5 SENSITIVE Sensitive     GENTAMICIN <=1 SENSITIVE Sensitive     IMIPENEM 2 SENSITIVE Sensitive     * >=100,000 COLONIES/mL PSEUDOMONAS AERUGINOSA  Culture, blood (routine x 2)     Status: None (Preliminary result)   Collection Time: 02/25/21  8:25 PM   Specimen: BLOOD LEFT WRIST  Result Value Ref Range Status   Specimen Description   Final    BLOOD LEFT WRIST Performed at [place] [address] [place], [place] [address]    Special Requests   Final    BOTTLES DRAWN AEROBIC AND ANAEROBIC Blood Culture adequate volume Performed at [place] [address] [place], [place] [address]    Culture   Final    NO GROWTH 4 DAYS Performed at [facility], [place] [address], [place], [place] [address]    Report Status PENDING  Incomplete  Culture, blood (routine x 2)     Status: None (Preliminary result)   Collection Time: 02/25/21  8:30 PM   Specimen: BLOOD  Result Value Ref Range Status   Specimen Description   Final    BLOOD LEFT ANTECUBITAL Performed at [place] [address] [place], [place] [address]    Special Requests   Final    BOTTLES DRAWN AEROBIC AND ANAEROBIC Blood Culture adequate volume Performed at [place] [address], [place], [place] [address]    Culture   Final    NO GROWTH 4 DAYS Performed at [facility], [place] [address], [place],  [address]    Report Status PENDING  Incomplete  Resp Panel by RT-PCR (Flu A&B, Covid) Nasopharyngeal Swab     Status: Abnormal   Collection Time: 02/25/21 11:45 PM   Specimen: Nasopharyngeal Swab; Nasopharyngeal(NP) swabs in vial transport medium  Result Value Ref Range Status   SARS Coronavirus 2 by RT PCR POSITIVE (A) NEGATIVE Final    Comment: RESULT CALLED TO, READ BACK BY AND VERIFIED WITH: SMITH J.03.03.22 @ 0124 BY MECIAL MECIAL J. (NOTE) SARS-CoV-2 target nucleic acids are DETECTED.  The SARS-CoV-2 RNA is generally detectable in upper respiratory specimens during the acute phase of infection. Positive results are indicative of the presence of the identified virus, but do not rule out bacterial infection or co-infection with other pathogens not detected by the test. Clinical correlation with patient history and other diagnostic information is necessary to determine patient infection status. The expected result is Negative.  Fact Sheet for Patients: EntrepreneurPulse.com.au  Fact Sheet for Healthcare Providers: IncredibleEmployment.be  This test is not yet approved or cleared by the [place] FDA and  has been authorized for detection and/or diagnosis of SARS-CoV-2 by FDA under an Emergency Use Authorization (EUA).  This EUA will remain in effect (meaning this t est can be used) for the duration of  the COVID-19 declaration under Section 564(b)(1) of the Act, 21 U.S.C. section 360bbb-3(b)(1), unless the authorization is terminated or revoked sooner.     Influenza A by PCR NEGATIVE NEGATIVE Final   Influenza B by PCR NEGATIVE NEGATIVE Final    Comment: (NOTE)  The Xpert Xpress SARS-CoV-2/FLU/RSV plus assay is intended as an aid in the diagnosis of influenza from  Nasopharyngeal swab specimens and should not be used as a sole basis for treatment. Nasal washings and aspirates are unacceptable for Xpert Xpress SARS-CoV-2/FLU/RSV testing.  Fact Sheet for Patients: EntrepreneurPulse.com.au  Fact Sheet for Healthcare Providers: IncredibleEmployment.be  This test is not yet approved or cleared by the [place] FDA and has been authorized for detection and/or diagnosis of SARS-CoV-2 by FDA under an Emergency Use Authorization (EUA). This EUA will remain in effect (meaning this test can be used) for the duration of the COVID-19 declaration under Section 564(b)(1) of the Act, 21 U.S.C. section 360bbb-3(b)(1), unless the authorization is terminated or revoked.  Performed at [facility], [place] [address], [place], [place] [address]   MRSA PCR Screening     Status: None   Collection Time: 02/28/21 12:28 AM   Specimen: Nasal Mucosa; Nasopharyngeal  Result Value Ref Range Status   MRSA by PCR NEGATIVE NEGATIVE Final    Comment:        The GeneXpert MRSA Assay (FDA approved for NASAL specimens only), is one component of a comprehensive MRSA colonization surveillance program. It is not intended to diagnose MRSA infection nor to guide or monitor treatment for MRSA infections. Performed at [facility], [place] [address] [place], [place] [address]      Time coordinating discharge: 39 minutes  SIGNED:   Hosie Poisson, MD  [facility] 03/03/2021, 8:12 AM

## 2021-03-03 NOTE — TOC Progression Note (Signed)
Transition of Care [facility]) - Progression Note    Patient Details  Name: [name] MRN: [id number] Date of Birth: [birth year]  Transition of [facility]) CM/SW Contact  Joaquin Courts, RN Phone Number: 03/03/2021, 11:49 AM  Clinical Narrative:    CM spoke with patient's spouse regarding planned dc for today.  Spouse is in the home and ready to receive patient.  PTAR transport arranged.    Expected Discharge Plan: [place] Barriers to Discharge: No Barriers Identified  Expected Discharge Plan and Services Expected Discharge Plan: [place]   Discharge Planning Services: CM Consult Post Acute Care Choice: [place] arrangements for the past 2 months: [place] Expected Discharge Date: 03/03/21               DME Arranged: N/A         HH Arranged: PT,OT,RN HH Agency: [facility] Date HH Agency Contacted: 03/01/21 Time [place]: 1214 Representative spoke with at [place]: [place] ([place]) Interventions    Readmission Risk Interventions Readmission Risk Prevention Plan 03/03/2021 01/23/2021  Transportation Screening Complete Complete  Medication Review Press photographer) Complete Complete  PCP or Specialist appointment within 3-5 days of discharge Complete -  [place] or Home Care Consult Complete -  SW Recovery Care/Counseling Consult Complete Complete  Palliative Care Screening Complete Complete  [place] Not Applicable Complete

## 2021-03-03 NOTE — Progress Notes (Signed)
Hypoglycemic Event  CBG: 58 Treatment: Oral glucose  Symptoms: asymptomatic  Follow-up CBG: Time:1630 CBG Result: 64  Possible Reasons for Event: pt has decrease oral intake,   Comments/MD notified: Encouraged pt to increased oral intake and meal. Gave pt additional oral meals to supplement.   Lelon Frohlich, Hayze Gazda R

## 2021-03-03 NOTE — Progress Notes (Addendum)
Pt discharge to home , EMS arrived to carry pt home. Pt in stable condition and happy to leave for home. SRP, RN

## 2021-03-03 NOTE — Progress Notes (Signed)
Husband updated about pt departure for home. SRP, RN

## 2021-03-03 NOTE — Discharge Instructions (Addendum)
PEG Tube Home Guide A percutaneous endoscopic gastrostomy (PEG) tube is used to deliver food, medicine, and fluids directly into the stomach. The tube has a clamp, a cap, and two anchors (bolsters). One bolster keeps the tube from coming out of the stomach. The other bolster holds the tube against the abdomen. You will be taught how to use and adjust your PEG tube before you leave the hospital. You will also be taught how to care for the opening (stoma) in your abdomen. Make sure that you understand:  How to care for your PEG tube.  How to care for your stoma.  How to give yourself feedings and medicines.  When to call your health care provider for help. Supplies needed:  Soapy water.  Clean, plain water.  Clean washcloth.  Bandage (dressing). This is optional.  Syringe. How to care for a PEG tube Check your PEG tube every day. Make sure:  It is not too tight. The bolster should rest gently over the stoma.  It is in the correct position. There is a mark on the tube that shows when it is in the correct position. Adjust the tube if you need to. Cleaning your stoma Clean your stoma every day. Follow these steps: 1. Wash your hands with soap and water for at least 20 seconds. If soap and water are not available, use hand sanitizer. 2. Check the skin around the stoma for redness, rash, swelling, drainage, or extra tissue growth. If you notice any of these, call your health care provider. 3. Wash the stoma and the skin around it using a clean, soft washcloth. Clean using a circular motion, and wipe away from the stoma opening, not toward it. ? Use warm, soapy water, and only use cleansers recommended by your health care provider. ? Rinse the stoma area with plain water. ? Pat the stoma area dry. 4. Place a dressing over the stoma if your health care provider told you to do that.   Giving yourself a feeding Your health care provider will give you instructions about:  How much  nutrition and fluid you will need for each feeding.  How often to have a feeding.  Whether to take medicine in the tube by itself or with a feeding. To give yourself a feeding, follow these steps: 1. Lay out all of the equipment that you will need. 2. Make sure that the nutritional formula is at room temperature. 3. Wash your hands with soap and water for at least 20 seconds. If soap and water are not available, use hand sanitizer. 4. Position yourself so that you are upright. You will need to stay upright throughout the feeding and for at least 30 minutes after the feeding. 5. Make sure the syringe plunger is pushed in. Place the tip of the syringe in clean water, and slowly pull the plunger to bring (draw up) the water into the syringe. 6. Remove the clamp and the cap from the PEG tube. 7. Push the water out of the syringe to clean (flush) the tube. 8. If the tube is clear, draw up the formula into the syringe. Make sure to use the right amount for each feeding and add water if necessary. 9. Slowly push the formula from the syringe through the tube. 10. After the feeding, flush the tube with water. 11. Put the clamp and the cap on the tube. 12. Stay sitting up or standing up straight for at least 30 minutes.   Giving yourself medicine To give  yourself medicine, follow these steps: 1. Lay out all of the equipment that you will need. 2. If your medicine is in tablet form, crush the tablet and dissolve it in water. 3. Wash your hands with soap and water for at least 20 seconds. If soap and water are not available, use hand sanitizer. 4. Position yourself so that you are upright. You will need to stay upright while you give yourself medicine and for at least 30 minutes afterward. 5. Make sure the syringe plunger is pushed in. Place the tip of the syringe in clean water, and slowly pull the plunger to bring (draw up) the water into the syringe. 6. Remove the clamp and the cap from the PEG  tube. 7. Push the water out of the syringe to clean (flush) the tube. 8. If the tube is clear, draw up the medicine into the syringe. 9. Slowly push the medicine from the syringe through the tube. 10. Flush the tube with water. 11. Put the clamp and the cap on the tube. 12. Stay sitting up or standing up straight for at least 30 minutes. Do not take sustained release (SR) medicines through your tube. If you are unsure if your medicine is an SR medicine, ask your health care provider or pharmacist.   Contact a health care provider if you have:  Soreness, redness, or irritation around your stoma.  Abdominal pain or bloating during or after your feedings.  Nausea, constipation, or diarrhea that will not go away.  A fever.  Problems with your PEG tube. Get help right away if:  Your tube is blocked.  Your tube falls out.  You have pain around your stoma.  You are bleeding from your stoma.  Your tube is leaking.  You choke or you have trouble breathing during or after a feeding. Summary  A percutaneous endoscopic gastrostomy (PEG) tube is used to deliver food and fluids directly into the stomach.  You will be taught how to use and adjust your PEG tube. You will also be taught how to care for the stoma in your abdomen.  Your health care provider will give you instructions on how to give yourself nutritional formula and medicines through your PEG tube.  Contact your health care provider if you have a fever or soreness, redness, or irritation around your stoma.  Get help right away if your tube leaks, is blocked, or falls out. Get help right away if you have pain or bleeding around your stoma. This information is not intended to replace advice given to you by your health care provider. Make sure you discuss any questions you have with your health care provider. Document Revised: 04/25/2020 Document Reviewed: 04/25/2020 Elsevier Patient Education  2021 [place], Adult An indwelling urinary catheter is a thin tube that is put into your bladder. The tube helps to drain pee (urine) out of your body. The tube goes in through your urethra. Your urethra is where pee comes out of your body. Your pee will come out through the catheter, then it will go into a bag (drainage bag). Take good care of your catheter so it will work well. How to wear your catheter and bag Supplies needed  Sticky tape (adhesive tape) or a leg strap.  Alcohol wipe or soap and water (if you use tape).  A clean towel (if you use tape).  Large overnight bag.  Smaller bag (leg bag). Wearing your catheter Attach your catheter to your leg  with tape or a leg strap.  Make sure the catheter is not pulled tight.  If a leg strap gets wet, take it off and put on a dry strap.  If you use tape to hold the bag on your leg: 1. Use an alcohol wipe or soap and water to wash your skin where the tape made it sticky before. 2. Use a clean towel to pat-dry that skin. 3. Use new tape to make the bag stay on your leg. Wearing your bags You should have been given a large overnight bag.  You may wear the overnight bag in the day or night.  Always have the overnight bag lower than your bladder.  Do not let the bag touch the floor.  Before you go to sleep, put a clean plastic bag in a wastebasket. Then hang the overnight bag inside the wastebasket. You should also have a smaller leg bag that fits under your clothes.  Always wear the leg bag below your knee.  Do not wear your leg bag at night. How to care for your skin and catheter Supplies needed  A clean washcloth.  Water and mild soap.  A clean towel. Caring for your skin and catheter  Clean the skin around your catheter every day: 1. Wash your hands with soap and water. 2. Wet a clean washcloth in warm water and mild soap. 3. Clean the skin around your urethra.  If you are female:  Gently spread the folds  of skin around your vagina (labia).  With the washcloth in your other hand, wipe the inner side of your labia on each side. Wipe from front to back.  If you are female:  Pull back any skin that covers the end of your penis (foreskin).  With the washcloth in your other hand, wipe your penis in small circles. Start wiping at the tip of your penis, then move away from the catheter.  Move the foreskin back in place, if needed. 4. With your free hand, hold the catheter close to where it goes into your body.  Keep holding the catheter during cleaning so it does not get pulled out. 5. With the washcloth in your other hand, clean the catheter.  Only wipe downward on the catheter.  Do not wipe upward toward your body. Doing this may push germs into your urethra and cause infection. 6. Use a clean towel to pat-dry the catheter and the skin around it. Make sure to wipe off all soap. 7. Wash your hands with soap and water.  Shower every day. Do not take baths.  Do not use cream, ointment, or lotion on the area where the catheter goes into your body, unless your doctor tells you to.  Do not use powders, sprays, or lotions on your genital area.  Check your skin around the catheter every day for signs of infection. Check for: ? Redness, swelling, or pain. ? Fluid or blood. ? Warmth. ? Pus or a bad smell.      How to empty the bag Supplies needed  Rubbing alcohol.  Gauze pad or cotton ball.  Tape or a leg strap. Emptying the bag Pour the pee out of your bag when it is ?- full, or at least 2-3 times a day. Do this for your overnight bag and your leg bag. 1. Wash your hands with soap and water. 2. Separate (detach) the bag from your leg. 3. Hold the bag over the toilet or a clean pail. Keep the bag  lower than your hips and bladder. This is so the pee (urine) does not go back into the tube. 4. Open the pour spout. It is at the bottom of the bag. 5. Empty the pee into the toilet or pail.  Do not let the pour spout touch any surface. 6. Put rubbing alcohol on a gauze pad or cotton ball. 7. Use the gauze pad or cotton ball to clean the pour spout. 8. Close the pour spout. 9. Attach the bag to your leg with tape or a leg strap. 10. Wash your hands with soap and water. Follow instructions for cleaning the drainage bag:  From the product maker.  As told by your doctor. How to change the bag Supplies needed  Alcohol wipes.  A clean bag.  Tape or a leg strap. Changing the bag Replace your bag when it starts to leak, smell bad, or look dirty. 1. Wash your hands with soap and water. 2. Separate the dirty bag from your leg. 3. Pinch the catheter with your fingers so that pee does not spill out. 4. Separate the catheter tube from the bag tube where these tubes connect (at the connection valve). Do not let the tubes touch any surface. 5. Clean the end of the catheter tube with an alcohol wipe. Use a different alcohol wipe to clean the end of the bag tube. 6. Connect the catheter tube to the tube of the clean bag. 7. Attach the clean bag to your leg with tape or a leg strap. Do not make the bag tight on your leg. 8. Wash your hands with soap and water. General rules  Never pull on your catheter. Never try to take it out. Doing that can hurt you.  Always wash your hands before and after you touch your catheter or bag. Use a mild, fragrance-free soap. If you do not have soap and water, use hand sanitizer.  Always make sure there are no twists or bends (kinks) in the catheter tube.  Always make sure there are no leaks in the catheter or bag.  Drink enough fluid to keep your pee pale yellow.  Do not take baths, swim, or use a hot tub.  If you are female, wipe from front to back after you poop (have a bowel movement).   Contact a doctor if:  Your pee is cloudy.  Your pee smells worse than usual.  Your catheter gets clogged.  Your catheter leaks.  Your bladder feels  full. Get help right away if:  You have redness, swelling, or pain where the catheter goes into your body.  You have fluid, blood, pus, or a bad smell coming from the area where the catheter goes into your body.  Your skin feels warm where the catheter goes into your body.  You have a fever.  You have pain in your: ? Belly (abdomen). ? Legs. ? Lower back. ? Bladder.  You see blood in the catheter.  Your pee is pink or red.  You feel sick to your stomach (nauseous).  You throw up (vomit).  You have chills.  Your pee is not draining into the bag.  Your catheter gets pulled out. Summary  An indwelling urinary catheter is a thin tube that is placed into the bladder to help drain pee (urine) out of the body.  The catheter is placed into the part of the body that drains pee from the bladder (urethra).  Taking good care of your catheter will keep it working properly  and help prevent problems.  Always wash your hands before and after touching your catheter or bag.  Never pull on your catheter or try to take it out. This information is not intended to replace advice given to you by your health care provider. Make sure you discuss any questions you have with your health care provider. Document Revised: 04/06/2019 Document Reviewed: 07/29/2017 Elsevier Patient Education  [place].

## 2021-03-04 DIAGNOSIS — D84821 Immunodeficiency due to drugs: Secondary | ICD-10-CM | POA: Diagnosis not present

## 2021-03-04 DIAGNOSIS — F321 Major depressive disorder, single episode, moderate: Secondary | ICD-10-CM | POA: Diagnosis not present

## 2021-03-04 DIAGNOSIS — Z09 Encounter for follow-up examination after completed treatment for conditions other than malignant neoplasm: Secondary | ICD-10-CM | POA: Diagnosis not present

## 2021-03-04 DIAGNOSIS — Z940 Kidney transplant status: Secondary | ICD-10-CM | POA: Diagnosis not present

## 2021-03-11 ENCOUNTER — Other Ambulatory Visit: Payer: Self-pay

## 2021-03-11 ENCOUNTER — Other Ambulatory Visit: Payer: BC Managed Care – PPO | Admitting: Nurse Practitioner

## 2021-03-11 DIAGNOSIS — L89154 Pressure ulcer of sacral region, stage 4: Secondary | ICD-10-CM | POA: Diagnosis not present

## 2021-03-11 DIAGNOSIS — Z515 Encounter for palliative care: Secondary | ICD-10-CM

## 2021-03-11 NOTE — Progress Notes (Addendum)
[facility] Consult Note Telephone: [phone]  Fax: [phone]  PATIENT NAME: [name] [place] [address] [phone] (home)  DOB: October 25, 1961 MRN: [id number]  PRIMARY CARE PROVIDER:    [name], MD,  [place] [address] [phone]  REFERRING PROVIDER:   [name], MD [address] [place],  [place] [address] [phone]  RESPONSIBLE PARTY:   Extended Emergency Contact Information Primary Emergency Contact: Doris, Lopez Home Phone: [phone] Work Phone: [phone] Mobile Phone: [phone] Relation: Spouse  I met face to face with patient and husband in home.  ASSESSMENT AND RECOMMENDATIONS:   Advance Care Planning: patient very hard of hearing, almost deaf. Communication was mainly through demonstration and writing. Today's visit consisted of building trust and discussions on Palliative care medicine as a specialized medical care for people living with serious illness, aimed at facilitating improved quality of life through symptoms relief, assisting with advance care planning and establishing goals of care. Husband expressed appreciation for education provided on Palliative care and how it differs from Hospice service. He verbalized concerns about referring his wife to Hospice care, saying a Nurse from [facility] evaluated patient and denied patient Home Health services saying patient needs to be on Hospice care. Patient's husband verbalized frustration and disappointment in the health care system, believed wife is not receiving the care she needs. We discussed patient's current illness and what it means in the larger context of patient's on-going co-morbidities including stage 4 sacral pressure ulcer. Patient's goal of care is function. Patient desire is to get well and strong enough to care for herself  independently.  Directives: Patient's code status is DNR. Patient's husband expressed concern that patient would not survive resuscitation attempt in the event of cardiac or respiratory arrest due to her frailty. Signed DNR form in home, copy uploaded to [facility] EMR  Symptom Management:  Pressure Ulcer: Patient with stage 4 sacral ulcer. Not currently under treatment. Patient husband report doing dressing changes daily and with peri-care if soiled. No report of fever, chills, SOB, or uncontrolled pain. Patient referred for [place] with [facility] home nursing, husband report patient was declined as patient was said to need skilled nursing facility for adequate management of her wound and conditions. Husband vehemently refused patient's placement in a nursing home. Husband report difficulty taking patient to medical appointments due to her non ambulatory status. Patient expressed concern about her care saying "pls I need a nurse, there is so much he can do". Patient teary during visit. Recommendation: Recommend referral to wound clinic for management. Consider referral to other [facility] like [facility] or [facility]. Dressing around peg tube changed during visit, site cleaned with saline and dried with a guaze. Peri care also completed around her foley cath.  Diarrhea: report on going loose stools, husband believed it is likely related to patient's consumption of protein drinks and frequent antibiotics use. Patient has prescription for Probiotics that she has been refusing to take, expressing concern for pill burden. Patient has been supplementing her meal intake with protein drinks. Recommendation: Role of probiotics in prevention of diarrhea in the setting of prolong antibiotics use discussed with patient. Patient verbalized understanding and agreed to take the probiotics, husband administered a dose during the visit, med scheduled for twice a day. Patient has order  for Imodium as needed. Addendum 03/12/2021  Visit findings and recommendations discussed on telephone with Dr.Brenda Manfredi.   Follow up Palliative Care Visit: Palliative care will continue to follow for complex decision making and symptom management. Return in about 4 weeks or prn.  Family /Caregiver/Community Supports: Patient lives at home with husband. Husband is her main caregiver.  Cognitive / Functional decline: Patient very HOH, total dependent on husband for all ADLs, able to feed self. Non-ambulatory, has BKA.  I spent 50 minutes providing this consultation, time includes time spent with patient and husband, chart review, provider coordination, and documentation. More than 50% of the time in this consultation was spent counseling and coordinating communication.   CHIEF COMPLAINT: loose stools  History obtained from review of EMR and discussion with patient and husband. Records reviewed and summarized bellow.  HISTORY OF PRESENT ILLNESS:  [name] is a 60 y.o. year old female with multiple medical problems including type 1 diabetes, HTN, renal transplant on immunosuppressants. Patient with history of multiple hospital admissions. 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 She had a PEG tube placed due to concerns for dyspahgia, patient report swallowing without issues at this time. Palliative Care was asked to follow this patient by consultation request of [name], MD to help address advance care planning and goals of care. This is an initial visit.  CODE STATUS: DNR  PPS: 30%  HOSPICE ELIGIBILITY/DIAGNOSIS: TBD  ROS Constitutional: denied fever, denies chills, endorsed fatigue  EYES: denies acute vision changes ENMT: denies dysphagia Cardiovascular: denies chest pain, denied palpitation Pulmonary: denies cough, denies increased SOB Abdomen: endorses fair appetite, denies constipation, endorses incontinence of bowel GU: has foley cath MSK:  endorses ROM limitations,  no falls reported Skin: sacral wound Neurological: endorses weakness, denies uncontrolled pain, denies insomnia Psych: endorsed sadness Heme/lymph/immuno: denies abnormal bleeding   Physical Exam Current and past weights: 105lbs, Ht 59f4", BMI 18.02kg/m2 General: chronically ill and frail appearing, thin, lying in bed in no obvious acute distress EYES: anicteric sclera, lids intact, no discharge  ENMT: very HOH, oral mucous membranes moist CV:  no LE edema Pulmonary: no increased work of breathing, no cough, no audible wheezes, room air Abdomen: no ascites GU: foley cath intact and patent MSK: severe sarcopenia, decreased ROM in all extremities, no contractures of LE, non ambulatory, right BKA Skin: skin tears to bilateral arms, scattered ecchymosis to bilateral arms and legs Neuro: Generalized weakness Psych: non-anxious affect today, A and O x 3 Hem/lymph/immuno: no widespread bruising   PAST MEDICAL HISTORY:  Past Medical History:  Diagnosis Date  . Anemia of chronic renal failure   . DDD (degenerative disc disease), lumbar   . Diabetes mellitus without complication ([facility] [place]    type 1  . Gait disorder    neurogenic  . Hypertension   . Hypothyroid 12/08/2020  . Neuropathy   . Renal disease   . Sacral decubitus ulcer, stage IV ([facility] 12/08/2020    SOCIAL HX:  Social History   Tobacco Use  . Smoking status: Never Smoker  . Smokeless tobacco: Never Used  Substance Use Topics  . Alcohol use: Never   FAMILY HX:  Family History  Problem Relation Age of Onset  . Hypertension Mother   . Hypertension Father     ALLERGIES: No Known Allergies   PERTINENT MEDICATIONS:  Outpatient Encounter Medications as of 03/11/2021  Medication Sig  . acetaminophen (TYLENOL) 325 MG tablet Take 2 tablets (650 mg total) by mouth every 6 (six) hours as needed for mild pain (  or Fever >/= 101). (Patient taking differently: Take 650 mg by mouth every 6 (six) hours as needed for mild pain or  fever.)  . aspirin EC 81 MG tablet Take 81 mg by mouth daily. Swallow whole.  Marland Kitchen atorvastatin (LIPITOR) 10 MG tablet Take 1 tablet (10 mg total) by mouth daily.  Marland Kitchen azaTHIOprine (IMURAN) 50 MG tablet Take 1 tablet (50 mg total) by mouth daily.  . B Complex-C-Folic Acid TABS Take 1 tablet by mouth daily.  . carbamide peroxide (DEBROX) 6.5 % OTIC solution Place 5 drops into the right ear 2 (two) times daily.  . cholestyramine (QUESTRAN) 4 g packet Take 1 packet (4 g total) by mouth 2 (two) times daily.  . ciprofloxacin (CIPRO) 500 MG tablet Take 1 tablet (500 mg total) by mouth 2 (two) times daily.  . collagenase (SANTYL) ointment Apply topically daily.  . cycloSPORINE (SANDIMMUNE) 25 MG capsule Take 3 capsules (75 mg total) by mouth 2 (two) times daily.  . insulin glargine (LANTUS) 100 UNIT/ML injection Inject 0.05 mLs (5 Units total) into the skin at bedtime.  . insulin lispro (HUMALOG) 100 UNIT/ML injection Inject 0.02 mLs (2 Units total) into the skin 3 (three) times daily before meals. Sliding scale if 200-250=2 units;251-300=4units;301-350=6 units;351-400=8 units;104-450=10 units;451-500=12 units subcutaneously before meals and at bedtime for DM  . levothyroxine (SYNTHROID) 88 MCG tablet Take 1 tablet (88 mcg total) by mouth daily before breakfast.  . liver oil-zinc oxide (DESITIN) 40 % ointment Apply topically as needed for irritation.  Marland Kitchen loperamide (IMODIUM) 2 MG capsule Take 1 capsule (2 mg total) by mouth as needed for diarrhea or loose stools.  . ondansetron (ZOFRAN-ODT) 4 MG disintegrating tablet Take 1 tablet (4 mg total) by mouth every 4 (four) hours as needed for nausea or vomiting.  Marland Kitchen oxyCODONE (OXY IR/ROXICODONE) 5 MG immediate release tablet Take 1 tablet (5 mg total) by mouth every 6 (six) hours as needed for severe pain.  . pantoprazole (PROTONIX) 40 MG tablet Take 1 tablet (40 mg total) by mouth daily.  . predniSONE (DELTASONE) 5 MG tablet Take 1 tablet (5 mg total) by mouth daily  with breakfast.  . saccharomyces boulardii (FLORASTOR) 250 MG capsule Take 1 capsule (250 mg total) by mouth 2 (two) times daily.  . sertraline (ZOLOFT) 25 MG tablet Take 1 tablet (25 mg total) by mouth daily.   No facility-administered encounter medications on file as of 03/11/2021.    Thank you for the opportunity to participate in the care of Ms. Thressa Sheller. The palliative care team will continue to follow. Please call our office at [phone] if we can be of additional assistance.  Jari Favre, DNP, AGPCNP-BC

## 2021-03-13 DIAGNOSIS — R339 Retention of urine, unspecified: Secondary | ICD-10-CM | POA: Diagnosis not present

## 2021-03-13 DIAGNOSIS — I10 Essential (primary) hypertension: Secondary | ICD-10-CM | POA: Diagnosis not present

## 2021-03-13 DIAGNOSIS — L89154 Pressure ulcer of sacral region, stage 4: Secondary | ICD-10-CM | POA: Diagnosis not present

## 2021-03-13 DIAGNOSIS — N1831 Chronic kidney disease, stage 3a: Secondary | ICD-10-CM | POA: Diagnosis not present

## 2021-03-13 DIAGNOSIS — E1021 Type 1 diabetes mellitus with diabetic nephropathy: Secondary | ICD-10-CM | POA: Diagnosis not present

## 2021-03-13 DIAGNOSIS — S98911S Complete traumatic amputation of right foot, level unspecified, sequela: Secondary | ICD-10-CM | POA: Diagnosis not present

## 2021-03-13 DIAGNOSIS — Z940 Kidney transplant status: Secondary | ICD-10-CM | POA: Diagnosis not present

## 2021-03-13 DIAGNOSIS — D649 Anemia, unspecified: Secondary | ICD-10-CM | POA: Diagnosis not present

## 2021-03-18 DIAGNOSIS — Z89511 Acquired absence of right leg below knee: Secondary | ICD-10-CM | POA: Diagnosis not present

## 2021-03-18 DIAGNOSIS — D649 Anemia, unspecified: Secondary | ICD-10-CM | POA: Diagnosis not present

## 2021-03-18 DIAGNOSIS — E871 Hypo-osmolality and hyponatremia: Secondary | ICD-10-CM | POA: Diagnosis not present

## 2021-03-18 DIAGNOSIS — Z940 Kidney transplant status: Secondary | ICD-10-CM | POA: Diagnosis not present

## 2021-03-19 ENCOUNTER — Telehealth: Payer: Self-pay

## 2021-03-19 NOTE — Telephone Encounter (Signed)
Received call from PCP nurse providing update that home health referral was sent to [facility] for skilled nursing. F/U made with [facility].

## 2021-03-20 DIAGNOSIS — R1084 Generalized abdominal pain: Secondary | ICD-10-CM | POA: Diagnosis not present

## 2021-03-20 DIAGNOSIS — E1022 Type 1 diabetes mellitus with diabetic chronic kidney disease: Secondary | ICD-10-CM | POA: Diagnosis not present

## 2021-03-20 DIAGNOSIS — E1065 Type 1 diabetes mellitus with hyperglycemia: Secondary | ICD-10-CM | POA: Diagnosis not present

## 2021-03-20 DIAGNOSIS — N183 Chronic kidney disease, stage 3 unspecified: Secondary | ICD-10-CM | POA: Diagnosis not present

## 2021-03-20 DIAGNOSIS — E10319 Type 1 diabetes mellitus with unspecified diabetic retinopathy without macular edema: Secondary | ICD-10-CM | POA: Diagnosis not present

## 2021-03-20 DIAGNOSIS — E440 Moderate protein-calorie malnutrition: Secondary | ICD-10-CM | POA: Diagnosis not present

## 2021-03-21 DIAGNOSIS — R339 Retention of urine, unspecified: Secondary | ICD-10-CM | POA: Diagnosis not present

## 2021-03-21 DIAGNOSIS — Z435 Encounter for attention to cystostomy: Secondary | ICD-10-CM | POA: Diagnosis not present

## 2021-03-21 DIAGNOSIS — N183 Chronic kidney disease, stage 3 unspecified: Secondary | ICD-10-CM | POA: Diagnosis not present

## 2021-03-21 DIAGNOSIS — E1022 Type 1 diabetes mellitus with diabetic chronic kidney disease: Secondary | ICD-10-CM | POA: Diagnosis not present

## 2021-03-21 DIAGNOSIS — L89322 Pressure ulcer of left buttock, stage 2: Secondary | ICD-10-CM | POA: Diagnosis not present

## 2021-03-21 DIAGNOSIS — C519 Malignant neoplasm of vulva, unspecified: Secondary | ICD-10-CM | POA: Diagnosis not present

## 2021-03-21 DIAGNOSIS — Z79891 Long term (current) use of opiate analgesic: Secondary | ICD-10-CM | POA: Diagnosis not present

## 2021-03-21 DIAGNOSIS — Z7952 Long term (current) use of systemic steroids: Secondary | ICD-10-CM | POA: Diagnosis not present

## 2021-03-21 DIAGNOSIS — Z7982 Long term (current) use of aspirin: Secondary | ICD-10-CM | POA: Diagnosis not present

## 2021-03-21 DIAGNOSIS — Z940 Kidney transplant status: Secondary | ICD-10-CM | POA: Diagnosis not present

## 2021-03-21 DIAGNOSIS — D649 Anemia, unspecified: Secondary | ICD-10-CM | POA: Diagnosis not present

## 2021-03-21 DIAGNOSIS — I129 Hypertensive chronic kidney disease with stage 1 through stage 4 chronic kidney disease, or unspecified chronic kidney disease: Secondary | ICD-10-CM | POA: Diagnosis not present

## 2021-03-21 DIAGNOSIS — F321 Major depressive disorder, single episode, moderate: Secondary | ICD-10-CM | POA: Diagnosis not present

## 2021-03-21 DIAGNOSIS — Z9483 Pancreas transplant status: Secondary | ICD-10-CM | POA: Diagnosis not present

## 2021-03-21 DIAGNOSIS — Z89431 Acquired absence of right foot: Secondary | ICD-10-CM | POA: Diagnosis not present

## 2021-03-21 DIAGNOSIS — L89154 Pressure ulcer of sacral region, stage 4: Secondary | ICD-10-CM | POA: Diagnosis not present

## 2021-03-23 ENCOUNTER — Other Ambulatory Visit: Payer: Self-pay | Admitting: Radiology

## 2021-03-23 DIAGNOSIS — R339 Retention of urine, unspecified: Secondary | ICD-10-CM | POA: Diagnosis not present

## 2021-03-24 DIAGNOSIS — L89322 Pressure ulcer of left buttock, stage 2: Secondary | ICD-10-CM | POA: Diagnosis not present

## 2021-03-24 DIAGNOSIS — Z435 Encounter for attention to cystostomy: Secondary | ICD-10-CM | POA: Diagnosis not present

## 2021-03-24 DIAGNOSIS — R339 Retention of urine, unspecified: Secondary | ICD-10-CM | POA: Diagnosis not present

## 2021-03-24 DIAGNOSIS — Z940 Kidney transplant status: Secondary | ICD-10-CM | POA: Diagnosis not present

## 2021-03-24 DIAGNOSIS — Z7982 Long term (current) use of aspirin: Secondary | ICD-10-CM | POA: Diagnosis not present

## 2021-03-24 DIAGNOSIS — D649 Anemia, unspecified: Secondary | ICD-10-CM | POA: Diagnosis not present

## 2021-03-24 DIAGNOSIS — Z89431 Acquired absence of right foot: Secondary | ICD-10-CM | POA: Diagnosis not present

## 2021-03-24 DIAGNOSIS — E1022 Type 1 diabetes mellitus with diabetic chronic kidney disease: Secondary | ICD-10-CM | POA: Diagnosis not present

## 2021-03-24 DIAGNOSIS — N183 Chronic kidney disease, stage 3 unspecified: Secondary | ICD-10-CM | POA: Diagnosis not present

## 2021-03-24 DIAGNOSIS — Z79891 Long term (current) use of opiate analgesic: Secondary | ICD-10-CM | POA: Diagnosis not present

## 2021-03-24 DIAGNOSIS — Z7952 Long term (current) use of systemic steroids: Secondary | ICD-10-CM | POA: Diagnosis not present

## 2021-03-24 DIAGNOSIS — S98911S Complete traumatic amputation of right foot, level unspecified, sequela: Secondary | ICD-10-CM | POA: Diagnosis not present

## 2021-03-24 DIAGNOSIS — C519 Malignant neoplasm of vulva, unspecified: Secondary | ICD-10-CM | POA: Diagnosis not present

## 2021-03-24 DIAGNOSIS — F321 Major depressive disorder, single episode, moderate: Secondary | ICD-10-CM | POA: Diagnosis not present

## 2021-03-24 DIAGNOSIS — L89154 Pressure ulcer of sacral region, stage 4: Secondary | ICD-10-CM | POA: Diagnosis not present

## 2021-03-24 DIAGNOSIS — N1831 Chronic kidney disease, stage 3a: Secondary | ICD-10-CM | POA: Diagnosis not present

## 2021-03-24 DIAGNOSIS — I129 Hypertensive chronic kidney disease with stage 1 through stage 4 chronic kidney disease, or unspecified chronic kidney disease: Secondary | ICD-10-CM | POA: Diagnosis not present

## 2021-03-24 DIAGNOSIS — Z9483 Pancreas transplant status: Secondary | ICD-10-CM | POA: Diagnosis not present

## 2021-03-25 ENCOUNTER — Other Ambulatory Visit: Payer: Self-pay

## 2021-03-25 ENCOUNTER — Ambulatory Visit (HOSPITAL_COMMUNITY)
Admission: RE | Admit: 2021-03-25 | Discharge: 2021-03-25 | Disposition: A | Discharge status: Home / Self Care | Payer: BC Managed Care – PPO | Source: Ambulatory Visit | Attending: Internal Medicine | Admitting: Internal Medicine

## 2021-03-25 DIAGNOSIS — E43 Unspecified severe protein-calorie malnutrition: Secondary | ICD-10-CM | POA: Diagnosis not present

## 2021-03-25 DIAGNOSIS — Z431 Encounter for attention to gastrostomy: Secondary | ICD-10-CM | POA: Diagnosis not present

## 2021-03-25 DIAGNOSIS — R1310 Dysphagia, unspecified: Secondary | ICD-10-CM

## 2021-03-25 HISTORY — PX: IR GASTROSTOMY TUBE REMOVAL: IMG5492

## 2021-03-25 MED ORDER — LIDOCAINE VISCOUS HCL 2 % MT SOLN
OROMUCOSAL | Status: AC
  Filled 2021-03-25: qty 15

## 2021-03-25 MED ORDER — LIDOCAINE VISCOUS HCL 2 % MT SOLN
OROMUCOSAL | Status: DC | PRN
  Administered 2021-03-25: 15 mL via OROMUCOSAL

## 2021-03-25 NOTE — Procedures (Signed)
  PROCEDURE SUMMARY:  Successful removal of gastrostomy tube.  No complications.   EBL = trace  Please see full dictation in imaging section of Epic for procedure details.   Armando Gang Arlette Schaad PA-C 03/25/2021 11:55 AM

## 2021-03-26 DIAGNOSIS — D649 Anemia, unspecified: Secondary | ICD-10-CM | POA: Diagnosis not present

## 2021-03-26 DIAGNOSIS — N183 Chronic kidney disease, stage 3 unspecified: Secondary | ICD-10-CM | POA: Diagnosis not present

## 2021-03-26 DIAGNOSIS — C519 Malignant neoplasm of vulva, unspecified: Secondary | ICD-10-CM | POA: Diagnosis not present

## 2021-03-26 DIAGNOSIS — Z7952 Long term (current) use of systemic steroids: Secondary | ICD-10-CM | POA: Diagnosis not present

## 2021-03-26 DIAGNOSIS — Z940 Kidney transplant status: Secondary | ICD-10-CM | POA: Diagnosis not present

## 2021-03-26 DIAGNOSIS — I129 Hypertensive chronic kidney disease with stage 1 through stage 4 chronic kidney disease, or unspecified chronic kidney disease: Secondary | ICD-10-CM | POA: Diagnosis not present

## 2021-03-26 DIAGNOSIS — E1022 Type 1 diabetes mellitus with diabetic chronic kidney disease: Secondary | ICD-10-CM | POA: Diagnosis not present

## 2021-03-26 DIAGNOSIS — Z89431 Acquired absence of right foot: Secondary | ICD-10-CM | POA: Diagnosis not present

## 2021-03-26 DIAGNOSIS — F321 Major depressive disorder, single episode, moderate: Secondary | ICD-10-CM | POA: Diagnosis not present

## 2021-03-26 DIAGNOSIS — Z79891 Long term (current) use of opiate analgesic: Secondary | ICD-10-CM | POA: Diagnosis not present

## 2021-03-26 DIAGNOSIS — L89154 Pressure ulcer of sacral region, stage 4: Secondary | ICD-10-CM | POA: Diagnosis not present

## 2021-03-26 DIAGNOSIS — Z9483 Pancreas transplant status: Secondary | ICD-10-CM | POA: Diagnosis not present

## 2021-03-26 DIAGNOSIS — Z435 Encounter for attention to cystostomy: Secondary | ICD-10-CM | POA: Diagnosis not present

## 2021-03-26 DIAGNOSIS — Z7982 Long term (current) use of aspirin: Secondary | ICD-10-CM | POA: Diagnosis not present

## 2021-03-26 DIAGNOSIS — L89322 Pressure ulcer of left buttock, stage 2: Secondary | ICD-10-CM | POA: Diagnosis not present

## 2021-03-26 DIAGNOSIS — R339 Retention of urine, unspecified: Secondary | ICD-10-CM | POA: Diagnosis not present

## 2021-03-27 DIAGNOSIS — Z7982 Long term (current) use of aspirin: Secondary | ICD-10-CM | POA: Diagnosis not present

## 2021-03-27 DIAGNOSIS — Z940 Kidney transplant status: Secondary | ICD-10-CM | POA: Diagnosis not present

## 2021-03-27 DIAGNOSIS — N183 Chronic kidney disease, stage 3 unspecified: Secondary | ICD-10-CM | POA: Diagnosis not present

## 2021-03-27 DIAGNOSIS — Z435 Encounter for attention to cystostomy: Secondary | ICD-10-CM | POA: Diagnosis not present

## 2021-03-27 DIAGNOSIS — Z7952 Long term (current) use of systemic steroids: Secondary | ICD-10-CM | POA: Diagnosis not present

## 2021-03-27 DIAGNOSIS — D649 Anemia, unspecified: Secondary | ICD-10-CM | POA: Diagnosis not present

## 2021-03-27 DIAGNOSIS — R339 Retention of urine, unspecified: Secondary | ICD-10-CM | POA: Diagnosis not present

## 2021-03-27 DIAGNOSIS — Z79891 Long term (current) use of opiate analgesic: Secondary | ICD-10-CM | POA: Diagnosis not present

## 2021-03-27 DIAGNOSIS — L89322 Pressure ulcer of left buttock, stage 2: Secondary | ICD-10-CM | POA: Diagnosis not present

## 2021-03-27 DIAGNOSIS — L89154 Pressure ulcer of sacral region, stage 4: Secondary | ICD-10-CM | POA: Diagnosis not present

## 2021-03-27 DIAGNOSIS — Z9483 Pancreas transplant status: Secondary | ICD-10-CM | POA: Diagnosis not present

## 2021-03-27 DIAGNOSIS — E1022 Type 1 diabetes mellitus with diabetic chronic kidney disease: Secondary | ICD-10-CM | POA: Diagnosis not present

## 2021-03-27 DIAGNOSIS — I129 Hypertensive chronic kidney disease with stage 1 through stage 4 chronic kidney disease, or unspecified chronic kidney disease: Secondary | ICD-10-CM | POA: Diagnosis not present

## 2021-03-27 DIAGNOSIS — Z89431 Acquired absence of right foot: Secondary | ICD-10-CM | POA: Diagnosis not present

## 2021-03-27 DIAGNOSIS — C519 Malignant neoplasm of vulva, unspecified: Secondary | ICD-10-CM | POA: Diagnosis not present

## 2021-03-27 DIAGNOSIS — F321 Major depressive disorder, single episode, moderate: Secondary | ICD-10-CM | POA: Diagnosis not present

## 2021-03-31 DIAGNOSIS — D649 Anemia, unspecified: Secondary | ICD-10-CM | POA: Diagnosis not present

## 2021-03-31 DIAGNOSIS — Z7952 Long term (current) use of systemic steroids: Secondary | ICD-10-CM | POA: Diagnosis not present

## 2021-03-31 DIAGNOSIS — Z89431 Acquired absence of right foot: Secondary | ICD-10-CM | POA: Diagnosis not present

## 2021-03-31 DIAGNOSIS — N183 Chronic kidney disease, stage 3 unspecified: Secondary | ICD-10-CM | POA: Diagnosis not present

## 2021-03-31 DIAGNOSIS — R339 Retention of urine, unspecified: Secondary | ICD-10-CM | POA: Diagnosis not present

## 2021-03-31 DIAGNOSIS — E1022 Type 1 diabetes mellitus with diabetic chronic kidney disease: Secondary | ICD-10-CM | POA: Diagnosis not present

## 2021-03-31 DIAGNOSIS — Z940 Kidney transplant status: Secondary | ICD-10-CM | POA: Diagnosis not present

## 2021-03-31 DIAGNOSIS — C519 Malignant neoplasm of vulva, unspecified: Secondary | ICD-10-CM | POA: Diagnosis not present

## 2021-03-31 DIAGNOSIS — Z7982 Long term (current) use of aspirin: Secondary | ICD-10-CM | POA: Diagnosis not present

## 2021-03-31 DIAGNOSIS — Z435 Encounter for attention to cystostomy: Secondary | ICD-10-CM | POA: Diagnosis not present

## 2021-03-31 DIAGNOSIS — L89154 Pressure ulcer of sacral region, stage 4: Secondary | ICD-10-CM | POA: Diagnosis not present

## 2021-03-31 DIAGNOSIS — Z79891 Long term (current) use of opiate analgesic: Secondary | ICD-10-CM | POA: Diagnosis not present

## 2021-03-31 DIAGNOSIS — Z9483 Pancreas transplant status: Secondary | ICD-10-CM | POA: Diagnosis not present

## 2021-03-31 DIAGNOSIS — Z79899 Other long term (current) drug therapy: Secondary | ICD-10-CM | POA: Diagnosis not present

## 2021-03-31 DIAGNOSIS — L89322 Pressure ulcer of left buttock, stage 2: Secondary | ICD-10-CM | POA: Diagnosis not present

## 2021-03-31 DIAGNOSIS — F321 Major depressive disorder, single episode, moderate: Secondary | ICD-10-CM | POA: Diagnosis not present

## 2021-03-31 DIAGNOSIS — I129 Hypertensive chronic kidney disease with stage 1 through stage 4 chronic kidney disease, or unspecified chronic kidney disease: Secondary | ICD-10-CM | POA: Diagnosis not present

## 2021-04-01 DIAGNOSIS — N183 Chronic kidney disease, stage 3 unspecified: Secondary | ICD-10-CM | POA: Diagnosis not present

## 2021-04-01 DIAGNOSIS — C519 Malignant neoplasm of vulva, unspecified: Secondary | ICD-10-CM | POA: Diagnosis not present

## 2021-04-01 DIAGNOSIS — R339 Retention of urine, unspecified: Secondary | ICD-10-CM | POA: Diagnosis not present

## 2021-04-01 DIAGNOSIS — Z89431 Acquired absence of right foot: Secondary | ICD-10-CM | POA: Diagnosis not present

## 2021-04-01 DIAGNOSIS — Z79891 Long term (current) use of opiate analgesic: Secondary | ICD-10-CM | POA: Diagnosis not present

## 2021-04-01 DIAGNOSIS — E1022 Type 1 diabetes mellitus with diabetic chronic kidney disease: Secondary | ICD-10-CM | POA: Diagnosis not present

## 2021-04-01 DIAGNOSIS — Z9483 Pancreas transplant status: Secondary | ICD-10-CM | POA: Diagnosis not present

## 2021-04-01 DIAGNOSIS — Z940 Kidney transplant status: Secondary | ICD-10-CM | POA: Diagnosis not present

## 2021-04-01 DIAGNOSIS — Z7982 Long term (current) use of aspirin: Secondary | ICD-10-CM | POA: Diagnosis not present

## 2021-04-01 DIAGNOSIS — F321 Major depressive disorder, single episode, moderate: Secondary | ICD-10-CM | POA: Diagnosis not present

## 2021-04-01 DIAGNOSIS — Z435 Encounter for attention to cystostomy: Secondary | ICD-10-CM | POA: Diagnosis not present

## 2021-04-01 DIAGNOSIS — L89154 Pressure ulcer of sacral region, stage 4: Secondary | ICD-10-CM | POA: Diagnosis not present

## 2021-04-01 DIAGNOSIS — Z7952 Long term (current) use of systemic steroids: Secondary | ICD-10-CM | POA: Diagnosis not present

## 2021-04-01 DIAGNOSIS — I129 Hypertensive chronic kidney disease with stage 1 through stage 4 chronic kidney disease, or unspecified chronic kidney disease: Secondary | ICD-10-CM | POA: Diagnosis not present

## 2021-04-01 DIAGNOSIS — D649 Anemia, unspecified: Secondary | ICD-10-CM | POA: Diagnosis not present

## 2021-04-01 DIAGNOSIS — L89322 Pressure ulcer of left buttock, stage 2: Secondary | ICD-10-CM | POA: Diagnosis not present

## 2021-04-02 ENCOUNTER — Other Ambulatory Visit: Payer: Self-pay

## 2021-04-02 ENCOUNTER — Ambulatory Visit (INDEPENDENT_AMBULATORY_CARE_PROVIDER_SITE_OTHER): Payer: BC Managed Care – PPO | Admitting: Otolaryngology

## 2021-04-02 VITALS — Temp 97.3°F

## 2021-04-02 DIAGNOSIS — H906 Mixed conductive and sensorineural hearing loss, bilateral: Secondary | ICD-10-CM | POA: Diagnosis not present

## 2021-04-02 DIAGNOSIS — H903 Sensorineural hearing loss, bilateral: Secondary | ICD-10-CM | POA: Diagnosis not present

## 2021-04-02 DIAGNOSIS — L89154 Pressure ulcer of sacral region, stage 4: Secondary | ICD-10-CM | POA: Diagnosis not present

## 2021-04-02 DIAGNOSIS — H6523 Chronic serous otitis media, bilateral: Secondary | ICD-10-CM

## 2021-04-02 NOTE — Progress Notes (Signed)
HPI: [name] is a 60 y.o. female who presents is referred by Dr. Suezanne Cheshire for evaluation of decreased hearing.  She apparently stated that she felt like she lost her hearing after having amputation performed about 6 months ago.  She has not had a previous hearing test.  Denies any ear pain.. She presents today with her son.  Past Medical History:  Diagnosis Date  . Anemia of chronic renal failure   . DDD (degenerative disc disease), lumbar   . Diabetes mellitus without complication ([place])    type 1  . Gait disorder    neurogenic  . Hypertension   . Hypothyroid 12/08/2020  . Neuropathy   . Renal disease   . Sacral decubitus ulcer, stage IV ([place]) 12/08/2020   Past Surgical History:  Procedure Laterality Date  . AMPUTATION Right 10/01/2020   Procedure: RIGHT BELOW KNEE AMPUTATION;  Surgeon: Newt Minion, MD;  Location: [facility];  Service: Orthopedics;  Laterality: Right;  . BLADDER SURGERY    . ESOPHAGOGASTRODUODENOSCOPY (EGD) WITH PROPOFOL N/A 11/10/2020   Procedure: ESOPHAGOGASTRODUODENOSCOPY (EGD) WITH PROPOFOL;  Surgeon: Irene Shipper, MD;  Location: [facility];  Service: Endoscopy;  Laterality: N/A;  . IR GASTROSTOMY TUBE REMOVAL  03/25/2021  . KIDNEY TRANSPLANT    . LEG SURGERY     tib-fib fx , ORIF   Social History   Socioeconomic History  . Marital status: Married    Spouse name: Not on file  . Number of children: Not on file  . Years of education: Not on file  . Highest education level: Not on file  Occupational History  . Not on file  Tobacco Use  . Smoking status: Never Smoker  . Smokeless tobacco: Never Used  Vaping Use  . Vaping Use: Never used  Substance and Sexual Activity  . Alcohol use: Never  . Drug use: Never  . Sexual activity: Not Currently    Birth control/protection: None  Other Topics Concern  . Not on file  Social History Narrative  . Not on file   Social Determinants of Health   Financial Resource Strain: Not on file   Food Insecurity: Not on file  Transportation Needs: Not on file  Physical Activity: Not on file  Stress: Not on file  Social Connections: Not on file   Family History  Problem Relation Age of Onset  . Hypertension Mother   . Hypertension Father    No Known Allergies Prior to Admission medications   Medication Sig Start Date End Date Taking? Authorizing Provider  acetaminophen (TYLENOL) 325 MG tablet Take 2 tablets (650 mg total) by mouth every 6 (six) hours as needed for mild pain (or Fever >/= 101). Patient taking differently: Take 650 mg by mouth every 6 (six) hours as needed for mild pain or fever. 10/06/20   Nita Sells, MD  aspirin EC 81 MG tablet Take 81 mg by mouth daily. Swallow whole.    [provider]  atorvastatin (LIPITOR) 10 MG tablet Take 1 tablet (10 mg total) by mouth daily. 03/02/21   Georgette Shell, MD  azaTHIOprine (IMURAN) 50 MG tablet Take 1 tablet (50 mg total) by mouth daily. 03/02/21   Georgette Shell, MD  B Complex-C-Folic Acid TABS Take 1 tablet by mouth daily.    [provider]  carbamide peroxide (DEBROX) 6.5 % OTIC solution Place 5 drops into the right ear 2 (two) times daily. 03/02/21   Georgette Shell, MD  cholestyramine Lucrezia Starch) 4 g  packet Take 1 packet (4 g total) by mouth 2 (two) times daily. 03/02/21   Georgette Shell, MD  ciprofloxacin (CIPRO) 500 MG tablet Take 1 tablet (500 mg total) by mouth 2 (two) times daily. 03/02/21   Georgette Shell, MD  collagenase (SANTYL) ointment Apply topically daily. 03/02/21   Georgette Shell, MD  cycloSPORINE (SANDIMMUNE) 25 MG capsule Take 3 capsules (75 mg total) by mouth 2 (two) times daily. 03/02/21   Georgette Shell, MD  insulin glargine (LANTUS) 100 UNIT/ML injection Inject 0.05 mLs (5 Units total) into the skin at bedtime. 03/03/21   Hosie Poisson, MD  insulin lispro (HUMALOG) 100 UNIT/ML injection Inject 0.02 mLs (2 Units total) into the skin 3 (three) times  daily before meals. Sliding scale if 200-250=2 units;251-300=4units;301-350=6 units;351-400=8 units;104-450=10 units;451-500=12 units subcutaneously before meals and at bedtime for DM 03/02/21   Georgette Shell, MD  levothyroxine (SYNTHROID) 88 MCG tablet Take 1 tablet (88 mcg total) by mouth daily before breakfast. 03/02/21   Georgette Shell, MD  liver oil-zinc oxide (DESITIN) 40 % ointment Apply topically as needed for irritation. 03/02/21   Georgette Shell, MD  loperamide (IMODIUM) 2 MG capsule Take 1 capsule (2 mg total) by mouth as needed for diarrhea or loose stools. 03/02/21   Georgette Shell, MD  ondansetron (ZOFRAN-ODT) 4 MG disintegrating tablet Take 1 tablet (4 mg total) by mouth every 4 (four) hours as needed for nausea or vomiting. 03/02/21   Georgette Shell, MD  oxyCODONE (OXY IR/ROXICODONE) 5 MG immediate release tablet Take 1 tablet (5 mg total) by mouth every 6 (six) hours as needed for severe pain. 03/02/21   Georgette Shell, MD  pantoprazole (PROTONIX) 40 MG tablet Take 1 tablet (40 mg total) by mouth daily. 03/02/21   Georgette Shell, MD  predniSONE (DELTASONE) 5 MG tablet Take 1 tablet (5 mg total) by mouth daily with breakfast. 03/02/21   Georgette Shell, MD  saccharomyces boulardii (FLORASTOR) 250 MG capsule Take 1 capsule (250 mg total) by mouth 2 (two) times daily. 03/02/21   Georgette Shell, MD  sertraline (ZOLOFT) 25 MG tablet Take 1 tablet (25 mg total) by mouth daily. 03/02/21   Georgette Shell, MD     Positive ROS: Otherwise negative  All other systems have been reviewed and were otherwise negative with the exception of those mentioned in the HPI and as above.  Physical Exam: Constitutional: Alert, well-appearing, no acute distress Ears: External ears without lesions or tenderness. Ear canals are clear bilaterally.  But both TMs are retracted with a serous otitis media.  I was able to insufflate some air behind both TMs with improvement  of her hearing. Nasal: External nose without lesions. Septum with mild deformity and mild rhinitis.  No mucopurulent discharge noted.. Clear nasal passages Oral: Lips and gums without lesions. Tongue and palate mucosa without lesions. Posterior oropharynx clear. Neck: No palpable adenopathy or masses Respiratory: Breathing comfortably  Skin: No facial/neck lesions or rash noted.  Audiogram in the office today demonstrated moderate to severe downsloping sensorineural hearing loss in both ears with an additional conductive component in the lower frequencies secondary to the serous otitis media.  She has type B tympanograms bilaterally. After insufflating air behind both TMs she had improvement of her hearing.  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 Bilateral serous otitis media with conductive hearing loss component in both ears.  Plan: Prescribed amoxicillin  875 mg twice daily for 2 weeks. Also prescribed Nasacort 2 sprays each nostril at night to help with eustachian tube dysfunction. Discussed with Jayona as well as her son concerning trying to "pop" her ears on a regular basis. She will follow-up in 5 to 6 weeks for recheck to make sure the fluid resolves.  She will still need hearing aids in order to hear adequately.   Radene Journey, MD   CC:

## 2021-04-03 DIAGNOSIS — I129 Hypertensive chronic kidney disease with stage 1 through stage 4 chronic kidney disease, or unspecified chronic kidney disease: Secondary | ICD-10-CM | POA: Diagnosis not present

## 2021-04-03 DIAGNOSIS — R339 Retention of urine, unspecified: Secondary | ICD-10-CM | POA: Diagnosis not present

## 2021-04-03 DIAGNOSIS — E1022 Type 1 diabetes mellitus with diabetic chronic kidney disease: Secondary | ICD-10-CM | POA: Diagnosis not present

## 2021-04-03 DIAGNOSIS — L89322 Pressure ulcer of left buttock, stage 2: Secondary | ICD-10-CM | POA: Diagnosis not present

## 2021-04-03 DIAGNOSIS — C519 Malignant neoplasm of vulva, unspecified: Secondary | ICD-10-CM | POA: Diagnosis not present

## 2021-04-03 DIAGNOSIS — F321 Major depressive disorder, single episode, moderate: Secondary | ICD-10-CM | POA: Diagnosis not present

## 2021-04-03 DIAGNOSIS — L89154 Pressure ulcer of sacral region, stage 4: Secondary | ICD-10-CM | POA: Diagnosis not present

## 2021-04-03 DIAGNOSIS — Z79891 Long term (current) use of opiate analgesic: Secondary | ICD-10-CM | POA: Diagnosis not present

## 2021-04-03 DIAGNOSIS — N183 Chronic kidney disease, stage 3 unspecified: Secondary | ICD-10-CM | POA: Diagnosis not present

## 2021-04-03 DIAGNOSIS — Z435 Encounter for attention to cystostomy: Secondary | ICD-10-CM | POA: Diagnosis not present

## 2021-04-03 DIAGNOSIS — Z9483 Pancreas transplant status: Secondary | ICD-10-CM | POA: Diagnosis not present

## 2021-04-03 DIAGNOSIS — Z940 Kidney transplant status: Secondary | ICD-10-CM | POA: Diagnosis not present

## 2021-04-03 DIAGNOSIS — Z7952 Long term (current) use of systemic steroids: Secondary | ICD-10-CM | POA: Diagnosis not present

## 2021-04-03 DIAGNOSIS — Z89431 Acquired absence of right foot: Secondary | ICD-10-CM | POA: Diagnosis not present

## 2021-04-03 DIAGNOSIS — Z7982 Long term (current) use of aspirin: Secondary | ICD-10-CM | POA: Diagnosis not present

## 2021-04-03 DIAGNOSIS — D649 Anemia, unspecified: Secondary | ICD-10-CM | POA: Diagnosis not present

## 2021-04-04 DIAGNOSIS — L89154 Pressure ulcer of sacral region, stage 4: Secondary | ICD-10-CM | POA: Diagnosis not present

## 2021-04-05 DIAGNOSIS — Z9483 Pancreas transplant status: Secondary | ICD-10-CM | POA: Diagnosis not present

## 2021-04-05 DIAGNOSIS — Z7952 Long term (current) use of systemic steroids: Secondary | ICD-10-CM | POA: Diagnosis not present

## 2021-04-05 DIAGNOSIS — Z940 Kidney transplant status: Secondary | ICD-10-CM | POA: Diagnosis not present

## 2021-04-05 DIAGNOSIS — Z435 Encounter for attention to cystostomy: Secondary | ICD-10-CM | POA: Diagnosis not present

## 2021-04-05 DIAGNOSIS — Z79891 Long term (current) use of opiate analgesic: Secondary | ICD-10-CM | POA: Diagnosis not present

## 2021-04-05 DIAGNOSIS — E1022 Type 1 diabetes mellitus with diabetic chronic kidney disease: Secondary | ICD-10-CM | POA: Diagnosis not present

## 2021-04-05 DIAGNOSIS — L89154 Pressure ulcer of sacral region, stage 4: Secondary | ICD-10-CM | POA: Diagnosis not present

## 2021-04-05 DIAGNOSIS — Z89431 Acquired absence of right foot: Secondary | ICD-10-CM | POA: Diagnosis not present

## 2021-04-05 DIAGNOSIS — D649 Anemia, unspecified: Secondary | ICD-10-CM | POA: Diagnosis not present

## 2021-04-05 DIAGNOSIS — Z7982 Long term (current) use of aspirin: Secondary | ICD-10-CM | POA: Diagnosis not present

## 2021-04-05 DIAGNOSIS — N183 Chronic kidney disease, stage 3 unspecified: Secondary | ICD-10-CM | POA: Diagnosis not present

## 2021-04-05 DIAGNOSIS — F321 Major depressive disorder, single episode, moderate: Secondary | ICD-10-CM | POA: Diagnosis not present

## 2021-04-05 DIAGNOSIS — R339 Retention of urine, unspecified: Secondary | ICD-10-CM | POA: Diagnosis not present

## 2021-04-05 DIAGNOSIS — I129 Hypertensive chronic kidney disease with stage 1 through stage 4 chronic kidney disease, or unspecified chronic kidney disease: Secondary | ICD-10-CM | POA: Diagnosis not present

## 2021-04-05 DIAGNOSIS — L89322 Pressure ulcer of left buttock, stage 2: Secondary | ICD-10-CM | POA: Diagnosis not present

## 2021-04-05 DIAGNOSIS — C519 Malignant neoplasm of vulva, unspecified: Secondary | ICD-10-CM | POA: Diagnosis not present

## 2021-04-06 DIAGNOSIS — L89154 Pressure ulcer of sacral region, stage 4: Secondary | ICD-10-CM | POA: Diagnosis not present

## 2021-04-06 DIAGNOSIS — R404 Transient alteration of awareness: Secondary | ICD-10-CM | POA: Diagnosis not present

## 2021-04-06 DIAGNOSIS — E161 Other hypoglycemia: Secondary | ICD-10-CM | POA: Diagnosis not present

## 2021-04-06 DIAGNOSIS — I10 Essential (primary) hypertension: Secondary | ICD-10-CM | POA: Diagnosis not present

## 2021-04-06 DIAGNOSIS — E162 Hypoglycemia, unspecified: Secondary | ICD-10-CM | POA: Diagnosis not present

## 2021-04-07 DIAGNOSIS — Z7952 Long term (current) use of systemic steroids: Secondary | ICD-10-CM | POA: Diagnosis not present

## 2021-04-07 DIAGNOSIS — R339 Retention of urine, unspecified: Secondary | ICD-10-CM | POA: Diagnosis not present

## 2021-04-07 DIAGNOSIS — N183 Chronic kidney disease, stage 3 unspecified: Secondary | ICD-10-CM | POA: Diagnosis not present

## 2021-04-07 DIAGNOSIS — E1069 Type 1 diabetes mellitus with other specified complication: Secondary | ICD-10-CM | POA: Diagnosis not present

## 2021-04-07 DIAGNOSIS — I129 Hypertensive chronic kidney disease with stage 1 through stage 4 chronic kidney disease, or unspecified chronic kidney disease: Secondary | ICD-10-CM | POA: Diagnosis not present

## 2021-04-07 DIAGNOSIS — Z79891 Long term (current) use of opiate analgesic: Secondary | ICD-10-CM | POA: Diagnosis not present

## 2021-04-07 DIAGNOSIS — Z7982 Long term (current) use of aspirin: Secondary | ICD-10-CM | POA: Diagnosis not present

## 2021-04-07 DIAGNOSIS — L89322 Pressure ulcer of left buttock, stage 2: Secondary | ICD-10-CM | POA: Diagnosis not present

## 2021-04-07 DIAGNOSIS — E785 Hyperlipidemia, unspecified: Secondary | ICD-10-CM | POA: Diagnosis not present

## 2021-04-07 DIAGNOSIS — Z940 Kidney transplant status: Secondary | ICD-10-CM | POA: Diagnosis not present

## 2021-04-07 DIAGNOSIS — Z435 Encounter for attention to cystostomy: Secondary | ICD-10-CM | POA: Diagnosis not present

## 2021-04-07 DIAGNOSIS — D649 Anemia, unspecified: Secondary | ICD-10-CM | POA: Diagnosis not present

## 2021-04-07 DIAGNOSIS — F321 Major depressive disorder, single episode, moderate: Secondary | ICD-10-CM | POA: Diagnosis not present

## 2021-04-07 DIAGNOSIS — Z9483 Pancreas transplant status: Secondary | ICD-10-CM | POA: Diagnosis not present

## 2021-04-07 DIAGNOSIS — D84821 Immunodeficiency due to drugs: Secondary | ICD-10-CM | POA: Diagnosis not present

## 2021-04-07 DIAGNOSIS — C519 Malignant neoplasm of vulva, unspecified: Secondary | ICD-10-CM | POA: Diagnosis not present

## 2021-04-07 DIAGNOSIS — L89154 Pressure ulcer of sacral region, stage 4: Secondary | ICD-10-CM | POA: Diagnosis not present

## 2021-04-07 DIAGNOSIS — E1022 Type 1 diabetes mellitus with diabetic chronic kidney disease: Secondary | ICD-10-CM | POA: Diagnosis not present

## 2021-04-07 DIAGNOSIS — Z89511 Acquired absence of right leg below knee: Secondary | ICD-10-CM | POA: Diagnosis not present

## 2021-04-07 DIAGNOSIS — Z89431 Acquired absence of right foot: Secondary | ICD-10-CM | POA: Diagnosis not present

## 2021-04-07 DIAGNOSIS — E1065 Type 1 diabetes mellitus with hyperglycemia: Secondary | ICD-10-CM | POA: Diagnosis not present

## 2021-04-08 ENCOUNTER — Other Ambulatory Visit: Payer: BC Managed Care – PPO | Admitting: Nurse Practitioner

## 2021-04-08 ENCOUNTER — Other Ambulatory Visit: Payer: Self-pay

## 2021-04-08 DIAGNOSIS — E1069 Type 1 diabetes mellitus with other specified complication: Secondary | ICD-10-CM | POA: Diagnosis not present

## 2021-04-08 DIAGNOSIS — Z515 Encounter for palliative care: Secondary | ICD-10-CM | POA: Diagnosis not present

## 2021-04-08 DIAGNOSIS — L89154 Pressure ulcer of sacral region, stage 4: Secondary | ICD-10-CM | POA: Diagnosis not present

## 2021-04-08 NOTE — Progress Notes (Signed)
[facility] Consult Note Telephone: [phone]  Fax: [phone]    Date of encounter: 04/08/21 PATIENT NAME: [name] [address] [place] [address]   [phone] (home)  DOB: 1961-09-28 MRN: [id number]   PRIMARY CARE PROVIDER:    [name], MD,  [place] [address] [phone]  REFERRING PROVIDER:   [name], MD [place],  [place] [address] [phone]  RESPONSIBLE PARTY:    Contact Information    Name Relation Home Work [address]   [name] [facility] [phone]      I met face to face with patient and family in home. Palliative Care was asked to follow this patient by consultation request of  Doris Croak, MD to address advance care planning and complex medical decision making. This is a follow up visit from 03/11/2021.                                  ASSESSMENT AND PLAN / RECOMMENDATIONS:   Advance Care Planning/Goals of Care:  Code Status: DNR Goal of Care: Goal of care if function. Directives: Patient has a signed DNR form in home, copy on [facility] EMR.   Symptom Management/Plan: Type 1 diabetes: CBG 218 during visit today, patient on sliding scale insulin. Patient's insulin pump discontinued during the hypoglycemia episode. She currently has Dexcone blood sugar monitoring device. Continue current plan of care with sliding scale insulin coverage with Humalog and Lantus at bed time, continue follow up with endocrinology as scheduled.  Stage 4 sacral ulcer: currently under treatment. Wound Vac applied yesterday and functioning properly, receives home health nursing visits for wound care 3 times a week. Continue current plan of care.  Weakness: Patient has been evaluated by physical therapy, waiting to start sessions. Patient and family verbalized desire for patient to be stronger  and be able to gain some independence with her ADLs.  Follow up Palliative Care Visit: Palliative care will continue to follow for complex medical decision making, advance care planning, and clarification of goals. Return in about 5 weeks or prn.  This visit was coded based on medical decision making (MDM).  PPS: 30%  HOSPICE ELIGIBILITY/DIAGNOSIS: TBD  CHIEF COMPLAIN: Follow up palliative care visit/ Hypoglycemia   HISTORY OF PRESENT ILLNESS:  [name] is a 60 y.o. year old female with type 1 diabetes. Patient report episode of hypoglycemia 2 days ago in the contex of Type 1 diabetes, with blood sugar dropping to 34, condition was severe and associated with lethargy and somnolence. Condition aggravated poor oral intake on the day while having insulin pump on. Report receiving Glucagon from EMS with improvement in symptoms. 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  Patient with ongoing stage 4 sacral ulcer, currently followed by home health wound care by [facility]. Patient's peg tube has been discontinued. She report feeling well and better today,her hearing is improved, endorsed good appetite, her color and mood improved.  Denied fever, denied chills, denied SOB, denied uncontrolled pain.  History obtained from review of EMR and discussion with patient and her husband.   I reviewed available labs, medications, imaging, studies and related documents from the EMR.  Records reviewed and  summarized above.   Obtained history from someone other than patient.  Review of Systems:  All 10 point systems reviewed and are negative except as documented in history of present illness above  Physical Exam: Vital signs: BP 160/79, P 81, RR 16,  97% on room air General: chronically ill and frail appearing, thin, lying in bed in NAD EYES: anicteric sclera, no  discharge  ENMT: hearing intact, oral mucous membranes moist CV: no LE edema Pulmonary: no increased work of breathing, no cough, no audible wheezes, room air Abdomen:no ascites GU: foley cath intact, urine clear and yellow WRU:[id number], no contractures, non ambulatory, right BKA Skin: dry and fragile, wound vac to sacrum intact Neuro: Generalized weakness, otherwise non-focal Psych: non-anxious affecttoday, A and O x 3 Hem/lymph/immuno: no widespread bruising  Thank you for the opportunity to participate in the care of [name].  The palliative care team will continue to follow. Please call our office at [phone] if we can be of additional assistance.   Doris Favre, DNP, AGPCNP-BC

## 2021-04-09 DIAGNOSIS — Z7952 Long term (current) use of systemic steroids: Secondary | ICD-10-CM | POA: Diagnosis not present

## 2021-04-09 DIAGNOSIS — R339 Retention of urine, unspecified: Secondary | ICD-10-CM | POA: Diagnosis not present

## 2021-04-09 DIAGNOSIS — Z435 Encounter for attention to cystostomy: Secondary | ICD-10-CM | POA: Diagnosis not present

## 2021-04-09 DIAGNOSIS — L89322 Pressure ulcer of left buttock, stage 2: Secondary | ICD-10-CM | POA: Diagnosis not present

## 2021-04-09 DIAGNOSIS — C519 Malignant neoplasm of vulva, unspecified: Secondary | ICD-10-CM | POA: Diagnosis not present

## 2021-04-09 DIAGNOSIS — Z89431 Acquired absence of right foot: Secondary | ICD-10-CM | POA: Diagnosis not present

## 2021-04-09 DIAGNOSIS — L89154 Pressure ulcer of sacral region, stage 4: Secondary | ICD-10-CM | POA: Diagnosis not present

## 2021-04-09 DIAGNOSIS — E1022 Type 1 diabetes mellitus with diabetic chronic kidney disease: Secondary | ICD-10-CM | POA: Diagnosis not present

## 2021-04-09 DIAGNOSIS — D649 Anemia, unspecified: Secondary | ICD-10-CM | POA: Diagnosis not present

## 2021-04-09 DIAGNOSIS — F321 Major depressive disorder, single episode, moderate: Secondary | ICD-10-CM | POA: Diagnosis not present

## 2021-04-09 DIAGNOSIS — Z940 Kidney transplant status: Secondary | ICD-10-CM | POA: Diagnosis not present

## 2021-04-09 DIAGNOSIS — Z79891 Long term (current) use of opiate analgesic: Secondary | ICD-10-CM | POA: Diagnosis not present

## 2021-04-09 DIAGNOSIS — N183 Chronic kidney disease, stage 3 unspecified: Secondary | ICD-10-CM | POA: Diagnosis not present

## 2021-04-09 DIAGNOSIS — I129 Hypertensive chronic kidney disease with stage 1 through stage 4 chronic kidney disease, or unspecified chronic kidney disease: Secondary | ICD-10-CM | POA: Diagnosis not present

## 2021-04-09 DIAGNOSIS — Z7982 Long term (current) use of aspirin: Secondary | ICD-10-CM | POA: Diagnosis not present

## 2021-04-09 DIAGNOSIS — Z9483 Pancreas transplant status: Secondary | ICD-10-CM | POA: Diagnosis not present

## 2021-04-10 ENCOUNTER — Encounter (INDEPENDENT_AMBULATORY_CARE_PROVIDER_SITE_OTHER): Payer: Self-pay

## 2021-04-10 DIAGNOSIS — L89154 Pressure ulcer of sacral region, stage 4: Secondary | ICD-10-CM | POA: Diagnosis not present

## 2021-04-11 DIAGNOSIS — Z79891 Long term (current) use of opiate analgesic: Secondary | ICD-10-CM | POA: Diagnosis not present

## 2021-04-11 DIAGNOSIS — E1022 Type 1 diabetes mellitus with diabetic chronic kidney disease: Secondary | ICD-10-CM | POA: Diagnosis not present

## 2021-04-11 DIAGNOSIS — Z940 Kidney transplant status: Secondary | ICD-10-CM | POA: Diagnosis not present

## 2021-04-11 DIAGNOSIS — D649 Anemia, unspecified: Secondary | ICD-10-CM | POA: Diagnosis not present

## 2021-04-11 DIAGNOSIS — L89322 Pressure ulcer of left buttock, stage 2: Secondary | ICD-10-CM | POA: Diagnosis not present

## 2021-04-11 DIAGNOSIS — R339 Retention of urine, unspecified: Secondary | ICD-10-CM | POA: Diagnosis not present

## 2021-04-11 DIAGNOSIS — Z7952 Long term (current) use of systemic steroids: Secondary | ICD-10-CM | POA: Diagnosis not present

## 2021-04-11 DIAGNOSIS — N183 Chronic kidney disease, stage 3 unspecified: Secondary | ICD-10-CM | POA: Diagnosis not present

## 2021-04-11 DIAGNOSIS — Z89431 Acquired absence of right foot: Secondary | ICD-10-CM | POA: Diagnosis not present

## 2021-04-11 DIAGNOSIS — C519 Malignant neoplasm of vulva, unspecified: Secondary | ICD-10-CM | POA: Diagnosis not present

## 2021-04-11 DIAGNOSIS — Z7982 Long term (current) use of aspirin: Secondary | ICD-10-CM | POA: Diagnosis not present

## 2021-04-11 DIAGNOSIS — I129 Hypertensive chronic kidney disease with stage 1 through stage 4 chronic kidney disease, or unspecified chronic kidney disease: Secondary | ICD-10-CM | POA: Diagnosis not present

## 2021-04-11 DIAGNOSIS — F321 Major depressive disorder, single episode, moderate: Secondary | ICD-10-CM | POA: Diagnosis not present

## 2021-04-11 DIAGNOSIS — L89154 Pressure ulcer of sacral region, stage 4: Secondary | ICD-10-CM | POA: Diagnosis not present

## 2021-04-11 DIAGNOSIS — Z435 Encounter for attention to cystostomy: Secondary | ICD-10-CM | POA: Diagnosis not present

## 2021-04-11 DIAGNOSIS — Z9483 Pancreas transplant status: Secondary | ICD-10-CM | POA: Diagnosis not present

## 2021-04-12 DIAGNOSIS — L89154 Pressure ulcer of sacral region, stage 4: Secondary | ICD-10-CM | POA: Diagnosis not present

## 2021-04-13 DIAGNOSIS — L89154 Pressure ulcer of sacral region, stage 4: Secondary | ICD-10-CM | POA: Diagnosis not present

## 2021-04-14 DIAGNOSIS — Z7982 Long term (current) use of aspirin: Secondary | ICD-10-CM | POA: Diagnosis not present

## 2021-04-14 DIAGNOSIS — C519 Malignant neoplasm of vulva, unspecified: Secondary | ICD-10-CM | POA: Diagnosis not present

## 2021-04-14 DIAGNOSIS — N183 Chronic kidney disease, stage 3 unspecified: Secondary | ICD-10-CM | POA: Diagnosis not present

## 2021-04-14 DIAGNOSIS — F321 Major depressive disorder, single episode, moderate: Secondary | ICD-10-CM | POA: Diagnosis not present

## 2021-04-14 DIAGNOSIS — Z7952 Long term (current) use of systemic steroids: Secondary | ICD-10-CM | POA: Diagnosis not present

## 2021-04-14 DIAGNOSIS — I129 Hypertensive chronic kidney disease with stage 1 through stage 4 chronic kidney disease, or unspecified chronic kidney disease: Secondary | ICD-10-CM | POA: Diagnosis not present

## 2021-04-14 DIAGNOSIS — Z940 Kidney transplant status: Secondary | ICD-10-CM | POA: Diagnosis not present

## 2021-04-14 DIAGNOSIS — Z79891 Long term (current) use of opiate analgesic: Secondary | ICD-10-CM | POA: Diagnosis not present

## 2021-04-14 DIAGNOSIS — D649 Anemia, unspecified: Secondary | ICD-10-CM | POA: Diagnosis not present

## 2021-04-14 DIAGNOSIS — Z435 Encounter for attention to cystostomy: Secondary | ICD-10-CM | POA: Diagnosis not present

## 2021-04-14 DIAGNOSIS — E1021 Type 1 diabetes mellitus with diabetic nephropathy: Secondary | ICD-10-CM | POA: Diagnosis not present

## 2021-04-14 DIAGNOSIS — I10 Essential (primary) hypertension: Secondary | ICD-10-CM | POA: Diagnosis not present

## 2021-04-14 DIAGNOSIS — Z9483 Pancreas transplant status: Secondary | ICD-10-CM | POA: Diagnosis not present

## 2021-04-14 DIAGNOSIS — R339 Retention of urine, unspecified: Secondary | ICD-10-CM | POA: Diagnosis not present

## 2021-04-14 DIAGNOSIS — L89154 Pressure ulcer of sacral region, stage 4: Secondary | ICD-10-CM | POA: Diagnosis not present

## 2021-04-14 DIAGNOSIS — Z89431 Acquired absence of right foot: Secondary | ICD-10-CM | POA: Diagnosis not present

## 2021-04-14 DIAGNOSIS — L89322 Pressure ulcer of left buttock, stage 2: Secondary | ICD-10-CM | POA: Diagnosis not present

## 2021-04-14 DIAGNOSIS — E1022 Type 1 diabetes mellitus with diabetic chronic kidney disease: Secondary | ICD-10-CM | POA: Diagnosis not present

## 2021-04-14 DIAGNOSIS — N1831 Chronic kidney disease, stage 3a: Secondary | ICD-10-CM | POA: Diagnosis not present

## 2021-04-14 DIAGNOSIS — E039 Hypothyroidism, unspecified: Secondary | ICD-10-CM | POA: Diagnosis not present

## 2021-04-15 DIAGNOSIS — L89154 Pressure ulcer of sacral region, stage 4: Secondary | ICD-10-CM | POA: Diagnosis not present

## 2021-04-16 DIAGNOSIS — N1831 Chronic kidney disease, stage 3a: Secondary | ICD-10-CM | POA: Diagnosis not present

## 2021-04-16 DIAGNOSIS — I10 Essential (primary) hypertension: Secondary | ICD-10-CM | POA: Diagnosis not present

## 2021-04-16 DIAGNOSIS — L89154 Pressure ulcer of sacral region, stage 4: Secondary | ICD-10-CM | POA: Diagnosis not present

## 2021-04-16 DIAGNOSIS — E039 Hypothyroidism, unspecified: Secondary | ICD-10-CM | POA: Diagnosis not present

## 2021-04-16 DIAGNOSIS — E1021 Type 1 diabetes mellitus with diabetic nephropathy: Secondary | ICD-10-CM | POA: Diagnosis not present

## 2021-04-17 DIAGNOSIS — Z435 Encounter for attention to cystostomy: Secondary | ICD-10-CM | POA: Diagnosis not present

## 2021-04-17 DIAGNOSIS — D649 Anemia, unspecified: Secondary | ICD-10-CM | POA: Diagnosis not present

## 2021-04-17 DIAGNOSIS — N183 Chronic kidney disease, stage 3 unspecified: Secondary | ICD-10-CM | POA: Diagnosis not present

## 2021-04-17 DIAGNOSIS — C519 Malignant neoplasm of vulva, unspecified: Secondary | ICD-10-CM | POA: Diagnosis not present

## 2021-04-17 DIAGNOSIS — F321 Major depressive disorder, single episode, moderate: Secondary | ICD-10-CM | POA: Diagnosis not present

## 2021-04-17 DIAGNOSIS — L89322 Pressure ulcer of left buttock, stage 2: Secondary | ICD-10-CM | POA: Diagnosis not present

## 2021-04-17 DIAGNOSIS — Z7982 Long term (current) use of aspirin: Secondary | ICD-10-CM | POA: Diagnosis not present

## 2021-04-17 DIAGNOSIS — Z9483 Pancreas transplant status: Secondary | ICD-10-CM | POA: Diagnosis not present

## 2021-04-17 DIAGNOSIS — L89154 Pressure ulcer of sacral region, stage 4: Secondary | ICD-10-CM | POA: Diagnosis not present

## 2021-04-17 DIAGNOSIS — I129 Hypertensive chronic kidney disease with stage 1 through stage 4 chronic kidney disease, or unspecified chronic kidney disease: Secondary | ICD-10-CM | POA: Diagnosis not present

## 2021-04-17 DIAGNOSIS — Z89431 Acquired absence of right foot: Secondary | ICD-10-CM | POA: Diagnosis not present

## 2021-04-17 DIAGNOSIS — E1022 Type 1 diabetes mellitus with diabetic chronic kidney disease: Secondary | ICD-10-CM | POA: Diagnosis not present

## 2021-04-17 DIAGNOSIS — Z940 Kidney transplant status: Secondary | ICD-10-CM | POA: Diagnosis not present

## 2021-04-17 DIAGNOSIS — Z79891 Long term (current) use of opiate analgesic: Secondary | ICD-10-CM | POA: Diagnosis not present

## 2021-04-17 DIAGNOSIS — Z7952 Long term (current) use of systemic steroids: Secondary | ICD-10-CM | POA: Diagnosis not present

## 2021-04-17 DIAGNOSIS — R339 Retention of urine, unspecified: Secondary | ICD-10-CM | POA: Diagnosis not present

## 2021-04-18 DIAGNOSIS — E871 Hypo-osmolality and hyponatremia: Secondary | ICD-10-CM | POA: Diagnosis not present

## 2021-04-18 DIAGNOSIS — D649 Anemia, unspecified: Secondary | ICD-10-CM | POA: Diagnosis not present

## 2021-04-18 DIAGNOSIS — Z940 Kidney transplant status: Secondary | ICD-10-CM | POA: Diagnosis not present

## 2021-04-18 DIAGNOSIS — L89154 Pressure ulcer of sacral region, stage 4: Secondary | ICD-10-CM | POA: Diagnosis not present

## 2021-04-18 DIAGNOSIS — Z89511 Acquired absence of right leg below knee: Secondary | ICD-10-CM | POA: Diagnosis not present

## 2021-04-19 DIAGNOSIS — L89154 Pressure ulcer of sacral region, stage 4: Secondary | ICD-10-CM | POA: Diagnosis not present

## 2021-04-20 DIAGNOSIS — L89154 Pressure ulcer of sacral region, stage 4: Secondary | ICD-10-CM | POA: Diagnosis not present

## 2021-04-21 DIAGNOSIS — Z7982 Long term (current) use of aspirin: Secondary | ICD-10-CM | POA: Diagnosis not present

## 2021-04-21 DIAGNOSIS — I129 Hypertensive chronic kidney disease with stage 1 through stage 4 chronic kidney disease, or unspecified chronic kidney disease: Secondary | ICD-10-CM | POA: Diagnosis not present

## 2021-04-21 DIAGNOSIS — N1831 Chronic kidney disease, stage 3a: Secondary | ICD-10-CM | POA: Diagnosis not present

## 2021-04-21 DIAGNOSIS — L89322 Pressure ulcer of left buttock, stage 2: Secondary | ICD-10-CM | POA: Diagnosis not present

## 2021-04-21 DIAGNOSIS — R339 Retention of urine, unspecified: Secondary | ICD-10-CM | POA: Diagnosis not present

## 2021-04-21 DIAGNOSIS — Z940 Kidney transplant status: Secondary | ICD-10-CM | POA: Diagnosis not present

## 2021-04-21 DIAGNOSIS — D649 Anemia, unspecified: Secondary | ICD-10-CM | POA: Diagnosis not present

## 2021-04-21 DIAGNOSIS — N183 Chronic kidney disease, stage 3 unspecified: Secondary | ICD-10-CM | POA: Diagnosis not present

## 2021-04-21 DIAGNOSIS — Z89431 Acquired absence of right foot: Secondary | ICD-10-CM | POA: Diagnosis not present

## 2021-04-21 DIAGNOSIS — E1021 Type 1 diabetes mellitus with diabetic nephropathy: Secondary | ICD-10-CM | POA: Diagnosis not present

## 2021-04-21 DIAGNOSIS — F321 Major depressive disorder, single episode, moderate: Secondary | ICD-10-CM | POA: Diagnosis not present

## 2021-04-21 DIAGNOSIS — S98911S Complete traumatic amputation of right foot, level unspecified, sequela: Secondary | ICD-10-CM | POA: Diagnosis not present

## 2021-04-21 DIAGNOSIS — Z89511 Acquired absence of right leg below knee: Secondary | ICD-10-CM | POA: Diagnosis not present

## 2021-04-21 DIAGNOSIS — Z9483 Pancreas transplant status: Secondary | ICD-10-CM | POA: Diagnosis not present

## 2021-04-21 DIAGNOSIS — E1022 Type 1 diabetes mellitus with diabetic chronic kidney disease: Secondary | ICD-10-CM | POA: Diagnosis not present

## 2021-04-21 DIAGNOSIS — L89154 Pressure ulcer of sacral region, stage 4: Secondary | ICD-10-CM | POA: Diagnosis not present

## 2021-04-21 DIAGNOSIS — Z435 Encounter for attention to cystostomy: Secondary | ICD-10-CM | POA: Diagnosis not present

## 2021-04-21 DIAGNOSIS — C519 Malignant neoplasm of vulva, unspecified: Secondary | ICD-10-CM | POA: Diagnosis not present

## 2021-04-21 DIAGNOSIS — Z79891 Long term (current) use of opiate analgesic: Secondary | ICD-10-CM | POA: Diagnosis not present

## 2021-04-21 DIAGNOSIS — E440 Moderate protein-calorie malnutrition: Secondary | ICD-10-CM | POA: Diagnosis not present

## 2021-04-21 DIAGNOSIS — Z7952 Long term (current) use of systemic steroids: Secondary | ICD-10-CM | POA: Diagnosis not present

## 2021-04-22 DIAGNOSIS — N183 Chronic kidney disease, stage 3 unspecified: Secondary | ICD-10-CM | POA: Diagnosis not present

## 2021-04-22 DIAGNOSIS — Z940 Kidney transplant status: Secondary | ICD-10-CM | POA: Diagnosis not present

## 2021-04-22 DIAGNOSIS — Z79891 Long term (current) use of opiate analgesic: Secondary | ICD-10-CM | POA: Diagnosis not present

## 2021-04-22 DIAGNOSIS — D649 Anemia, unspecified: Secondary | ICD-10-CM | POA: Diagnosis not present

## 2021-04-22 DIAGNOSIS — L89154 Pressure ulcer of sacral region, stage 4: Secondary | ICD-10-CM | POA: Diagnosis not present

## 2021-04-22 DIAGNOSIS — Z435 Encounter for attention to cystostomy: Secondary | ICD-10-CM | POA: Diagnosis not present

## 2021-04-22 DIAGNOSIS — F321 Major depressive disorder, single episode, moderate: Secondary | ICD-10-CM | POA: Diagnosis not present

## 2021-04-22 DIAGNOSIS — R339 Retention of urine, unspecified: Secondary | ICD-10-CM | POA: Diagnosis not present

## 2021-04-22 DIAGNOSIS — I129 Hypertensive chronic kidney disease with stage 1 through stage 4 chronic kidney disease, or unspecified chronic kidney disease: Secondary | ICD-10-CM | POA: Diagnosis not present

## 2021-04-22 DIAGNOSIS — C519 Malignant neoplasm of vulva, unspecified: Secondary | ICD-10-CM | POA: Diagnosis not present

## 2021-04-22 DIAGNOSIS — Z7982 Long term (current) use of aspirin: Secondary | ICD-10-CM | POA: Diagnosis not present

## 2021-04-22 DIAGNOSIS — Z9483 Pancreas transplant status: Secondary | ICD-10-CM | POA: Diagnosis not present

## 2021-04-22 DIAGNOSIS — Z7952 Long term (current) use of systemic steroids: Secondary | ICD-10-CM | POA: Diagnosis not present

## 2021-04-22 DIAGNOSIS — E1022 Type 1 diabetes mellitus with diabetic chronic kidney disease: Secondary | ICD-10-CM | POA: Diagnosis not present

## 2021-04-22 DIAGNOSIS — Z89431 Acquired absence of right foot: Secondary | ICD-10-CM | POA: Diagnosis not present

## 2021-04-22 DIAGNOSIS — L89322 Pressure ulcer of left buttock, stage 2: Secondary | ICD-10-CM | POA: Diagnosis not present

## 2021-04-23 DIAGNOSIS — R339 Retention of urine, unspecified: Secondary | ICD-10-CM | POA: Diagnosis not present

## 2021-04-23 DIAGNOSIS — Z9483 Pancreas transplant status: Secondary | ICD-10-CM | POA: Diagnosis not present

## 2021-04-23 DIAGNOSIS — D649 Anemia, unspecified: Secondary | ICD-10-CM | POA: Diagnosis not present

## 2021-04-23 DIAGNOSIS — Z89431 Acquired absence of right foot: Secondary | ICD-10-CM | POA: Diagnosis not present

## 2021-04-23 DIAGNOSIS — I129 Hypertensive chronic kidney disease with stage 1 through stage 4 chronic kidney disease, or unspecified chronic kidney disease: Secondary | ICD-10-CM | POA: Diagnosis not present

## 2021-04-23 DIAGNOSIS — E1022 Type 1 diabetes mellitus with diabetic chronic kidney disease: Secondary | ICD-10-CM | POA: Diagnosis not present

## 2021-04-23 DIAGNOSIS — L89322 Pressure ulcer of left buttock, stage 2: Secondary | ICD-10-CM | POA: Diagnosis not present

## 2021-04-23 DIAGNOSIS — Z940 Kidney transplant status: Secondary | ICD-10-CM | POA: Diagnosis not present

## 2021-04-23 DIAGNOSIS — Z435 Encounter for attention to cystostomy: Secondary | ICD-10-CM | POA: Diagnosis not present

## 2021-04-23 DIAGNOSIS — F321 Major depressive disorder, single episode, moderate: Secondary | ICD-10-CM | POA: Diagnosis not present

## 2021-04-23 DIAGNOSIS — Z7982 Long term (current) use of aspirin: Secondary | ICD-10-CM | POA: Diagnosis not present

## 2021-04-23 DIAGNOSIS — Z7952 Long term (current) use of systemic steroids: Secondary | ICD-10-CM | POA: Diagnosis not present

## 2021-04-23 DIAGNOSIS — C519 Malignant neoplasm of vulva, unspecified: Secondary | ICD-10-CM | POA: Diagnosis not present

## 2021-04-23 DIAGNOSIS — N183 Chronic kidney disease, stage 3 unspecified: Secondary | ICD-10-CM | POA: Diagnosis not present

## 2021-04-23 DIAGNOSIS — Z79891 Long term (current) use of opiate analgesic: Secondary | ICD-10-CM | POA: Diagnosis not present

## 2021-04-23 DIAGNOSIS — L89154 Pressure ulcer of sacral region, stage 4: Secondary | ICD-10-CM | POA: Diagnosis not present

## 2021-04-24 DIAGNOSIS — Z940 Kidney transplant status: Secondary | ICD-10-CM | POA: Diagnosis not present

## 2021-04-24 DIAGNOSIS — N183 Chronic kidney disease, stage 3 unspecified: Secondary | ICD-10-CM | POA: Diagnosis not present

## 2021-04-24 DIAGNOSIS — Z7952 Long term (current) use of systemic steroids: Secondary | ICD-10-CM | POA: Diagnosis not present

## 2021-04-24 DIAGNOSIS — L89154 Pressure ulcer of sacral region, stage 4: Secondary | ICD-10-CM | POA: Diagnosis not present

## 2021-04-24 DIAGNOSIS — Z89431 Acquired absence of right foot: Secondary | ICD-10-CM | POA: Diagnosis not present

## 2021-04-24 DIAGNOSIS — R339 Retention of urine, unspecified: Secondary | ICD-10-CM | POA: Diagnosis not present

## 2021-04-24 DIAGNOSIS — Z7982 Long term (current) use of aspirin: Secondary | ICD-10-CM | POA: Diagnosis not present

## 2021-04-24 DIAGNOSIS — I129 Hypertensive chronic kidney disease with stage 1 through stage 4 chronic kidney disease, or unspecified chronic kidney disease: Secondary | ICD-10-CM | POA: Diagnosis not present

## 2021-04-24 DIAGNOSIS — Z79891 Long term (current) use of opiate analgesic: Secondary | ICD-10-CM | POA: Diagnosis not present

## 2021-04-24 DIAGNOSIS — D649 Anemia, unspecified: Secondary | ICD-10-CM | POA: Diagnosis not present

## 2021-04-24 DIAGNOSIS — F321 Major depressive disorder, single episode, moderate: Secondary | ICD-10-CM | POA: Diagnosis not present

## 2021-04-24 DIAGNOSIS — C519 Malignant neoplasm of vulva, unspecified: Secondary | ICD-10-CM | POA: Diagnosis not present

## 2021-04-24 DIAGNOSIS — Z9483 Pancreas transplant status: Secondary | ICD-10-CM | POA: Diagnosis not present

## 2021-04-24 DIAGNOSIS — L89322 Pressure ulcer of left buttock, stage 2: Secondary | ICD-10-CM | POA: Diagnosis not present

## 2021-04-24 DIAGNOSIS — E1022 Type 1 diabetes mellitus with diabetic chronic kidney disease: Secondary | ICD-10-CM | POA: Diagnosis not present

## 2021-04-24 DIAGNOSIS — Z435 Encounter for attention to cystostomy: Secondary | ICD-10-CM | POA: Diagnosis not present

## 2021-04-25 DIAGNOSIS — L89154 Pressure ulcer of sacral region, stage 4: Secondary | ICD-10-CM | POA: Diagnosis not present

## 2021-04-26 DIAGNOSIS — L89154 Pressure ulcer of sacral region, stage 4: Secondary | ICD-10-CM | POA: Diagnosis not present

## 2021-04-27 DIAGNOSIS — C519 Malignant neoplasm of vulva, unspecified: Secondary | ICD-10-CM | POA: Diagnosis not present

## 2021-04-27 DIAGNOSIS — F321 Major depressive disorder, single episode, moderate: Secondary | ICD-10-CM | POA: Diagnosis not present

## 2021-04-27 DIAGNOSIS — L89322 Pressure ulcer of left buttock, stage 2: Secondary | ICD-10-CM | POA: Diagnosis not present

## 2021-04-27 DIAGNOSIS — D649 Anemia, unspecified: Secondary | ICD-10-CM | POA: Diagnosis not present

## 2021-04-27 DIAGNOSIS — Z435 Encounter for attention to cystostomy: Secondary | ICD-10-CM | POA: Diagnosis not present

## 2021-04-27 DIAGNOSIS — I129 Hypertensive chronic kidney disease with stage 1 through stage 4 chronic kidney disease, or unspecified chronic kidney disease: Secondary | ICD-10-CM | POA: Diagnosis not present

## 2021-04-27 DIAGNOSIS — Z7982 Long term (current) use of aspirin: Secondary | ICD-10-CM | POA: Diagnosis not present

## 2021-04-27 DIAGNOSIS — Z79891 Long term (current) use of opiate analgesic: Secondary | ICD-10-CM | POA: Diagnosis not present

## 2021-04-27 DIAGNOSIS — Z940 Kidney transplant status: Secondary | ICD-10-CM | POA: Diagnosis not present

## 2021-04-27 DIAGNOSIS — R339 Retention of urine, unspecified: Secondary | ICD-10-CM | POA: Diagnosis not present

## 2021-04-27 DIAGNOSIS — L89154 Pressure ulcer of sacral region, stage 4: Secondary | ICD-10-CM | POA: Diagnosis not present

## 2021-04-27 DIAGNOSIS — E1022 Type 1 diabetes mellitus with diabetic chronic kidney disease: Secondary | ICD-10-CM | POA: Diagnosis not present

## 2021-04-27 DIAGNOSIS — N183 Chronic kidney disease, stage 3 unspecified: Secondary | ICD-10-CM | POA: Diagnosis not present

## 2021-04-27 DIAGNOSIS — Z89431 Acquired absence of right foot: Secondary | ICD-10-CM | POA: Diagnosis not present

## 2021-04-27 DIAGNOSIS — Z9483 Pancreas transplant status: Secondary | ICD-10-CM | POA: Diagnosis not present

## 2021-04-27 DIAGNOSIS — Z7952 Long term (current) use of systemic steroids: Secondary | ICD-10-CM | POA: Diagnosis not present

## 2021-04-28 DIAGNOSIS — L89154 Pressure ulcer of sacral region, stage 4: Secondary | ICD-10-CM | POA: Diagnosis not present

## 2021-04-29 DIAGNOSIS — L89154 Pressure ulcer of sacral region, stage 4: Secondary | ICD-10-CM | POA: Diagnosis not present

## 2021-04-29 DIAGNOSIS — I129 Hypertensive chronic kidney disease with stage 1 through stage 4 chronic kidney disease, or unspecified chronic kidney disease: Secondary | ICD-10-CM | POA: Diagnosis not present

## 2021-04-29 DIAGNOSIS — L89322 Pressure ulcer of left buttock, stage 2: Secondary | ICD-10-CM | POA: Diagnosis not present

## 2021-04-29 DIAGNOSIS — Z7982 Long term (current) use of aspirin: Secondary | ICD-10-CM | POA: Diagnosis not present

## 2021-04-29 DIAGNOSIS — Z79891 Long term (current) use of opiate analgesic: Secondary | ICD-10-CM | POA: Diagnosis not present

## 2021-04-29 DIAGNOSIS — N1831 Chronic kidney disease, stage 3a: Secondary | ICD-10-CM | POA: Diagnosis not present

## 2021-04-29 DIAGNOSIS — R339 Retention of urine, unspecified: Secondary | ICD-10-CM | POA: Diagnosis not present

## 2021-04-29 DIAGNOSIS — N183 Chronic kidney disease, stage 3 unspecified: Secondary | ICD-10-CM | POA: Diagnosis not present

## 2021-04-29 DIAGNOSIS — Z9483 Pancreas transplant status: Secondary | ICD-10-CM | POA: Diagnosis not present

## 2021-04-29 DIAGNOSIS — Z940 Kidney transplant status: Secondary | ICD-10-CM | POA: Diagnosis not present

## 2021-04-29 DIAGNOSIS — C519 Malignant neoplasm of vulva, unspecified: Secondary | ICD-10-CM | POA: Diagnosis not present

## 2021-04-29 DIAGNOSIS — D649 Anemia, unspecified: Secondary | ICD-10-CM | POA: Diagnosis not present

## 2021-04-29 DIAGNOSIS — E1022 Type 1 diabetes mellitus with diabetic chronic kidney disease: Secondary | ICD-10-CM | POA: Diagnosis not present

## 2021-04-29 DIAGNOSIS — F321 Major depressive disorder, single episode, moderate: Secondary | ICD-10-CM | POA: Diagnosis not present

## 2021-04-29 DIAGNOSIS — Z435 Encounter for attention to cystostomy: Secondary | ICD-10-CM | POA: Diagnosis not present

## 2021-04-29 DIAGNOSIS — E1021 Type 1 diabetes mellitus with diabetic nephropathy: Secondary | ICD-10-CM | POA: Diagnosis not present

## 2021-04-29 DIAGNOSIS — Z7952 Long term (current) use of systemic steroids: Secondary | ICD-10-CM | POA: Diagnosis not present

## 2021-04-29 DIAGNOSIS — Z89431 Acquired absence of right foot: Secondary | ICD-10-CM | POA: Diagnosis not present

## 2021-04-30 DIAGNOSIS — L89154 Pressure ulcer of sacral region, stage 4: Secondary | ICD-10-CM | POA: Diagnosis not present

## 2021-05-01 DIAGNOSIS — C519 Malignant neoplasm of vulva, unspecified: Secondary | ICD-10-CM | POA: Diagnosis not present

## 2021-05-01 DIAGNOSIS — Z89431 Acquired absence of right foot: Secondary | ICD-10-CM | POA: Diagnosis not present

## 2021-05-01 DIAGNOSIS — Z7982 Long term (current) use of aspirin: Secondary | ICD-10-CM | POA: Diagnosis not present

## 2021-05-01 DIAGNOSIS — L89322 Pressure ulcer of left buttock, stage 2: Secondary | ICD-10-CM | POA: Diagnosis not present

## 2021-05-01 DIAGNOSIS — N183 Chronic kidney disease, stage 3 unspecified: Secondary | ICD-10-CM | POA: Diagnosis not present

## 2021-05-01 DIAGNOSIS — Z7952 Long term (current) use of systemic steroids: Secondary | ICD-10-CM | POA: Diagnosis not present

## 2021-05-01 DIAGNOSIS — R339 Retention of urine, unspecified: Secondary | ICD-10-CM | POA: Diagnosis not present

## 2021-05-01 DIAGNOSIS — Z940 Kidney transplant status: Secondary | ICD-10-CM | POA: Diagnosis not present

## 2021-05-01 DIAGNOSIS — Z435 Encounter for attention to cystostomy: Secondary | ICD-10-CM | POA: Diagnosis not present

## 2021-05-01 DIAGNOSIS — I129 Hypertensive chronic kidney disease with stage 1 through stage 4 chronic kidney disease, or unspecified chronic kidney disease: Secondary | ICD-10-CM | POA: Diagnosis not present

## 2021-05-01 DIAGNOSIS — Z9483 Pancreas transplant status: Secondary | ICD-10-CM | POA: Diagnosis not present

## 2021-05-01 DIAGNOSIS — D649 Anemia, unspecified: Secondary | ICD-10-CM | POA: Diagnosis not present

## 2021-05-01 DIAGNOSIS — E1022 Type 1 diabetes mellitus with diabetic chronic kidney disease: Secondary | ICD-10-CM | POA: Diagnosis not present

## 2021-05-01 DIAGNOSIS — F321 Major depressive disorder, single episode, moderate: Secondary | ICD-10-CM | POA: Diagnosis not present

## 2021-05-01 DIAGNOSIS — Z79891 Long term (current) use of opiate analgesic: Secondary | ICD-10-CM | POA: Diagnosis not present

## 2021-05-01 DIAGNOSIS — L89154 Pressure ulcer of sacral region, stage 4: Secondary | ICD-10-CM | POA: Diagnosis not present

## 2021-05-02 DIAGNOSIS — L89154 Pressure ulcer of sacral region, stage 4: Secondary | ICD-10-CM | POA: Diagnosis not present

## 2021-05-03 DIAGNOSIS — L89154 Pressure ulcer of sacral region, stage 4: Secondary | ICD-10-CM | POA: Diagnosis not present

## 2021-05-04 DIAGNOSIS — N183 Chronic kidney disease, stage 3 unspecified: Secondary | ICD-10-CM | POA: Diagnosis not present

## 2021-05-04 DIAGNOSIS — L89322 Pressure ulcer of left buttock, stage 2: Secondary | ICD-10-CM | POA: Diagnosis not present

## 2021-05-04 DIAGNOSIS — D649 Anemia, unspecified: Secondary | ICD-10-CM | POA: Diagnosis not present

## 2021-05-04 DIAGNOSIS — Z89431 Acquired absence of right foot: Secondary | ICD-10-CM | POA: Diagnosis not present

## 2021-05-04 DIAGNOSIS — Z435 Encounter for attention to cystostomy: Secondary | ICD-10-CM | POA: Diagnosis not present

## 2021-05-04 DIAGNOSIS — F321 Major depressive disorder, single episode, moderate: Secondary | ICD-10-CM | POA: Diagnosis not present

## 2021-05-04 DIAGNOSIS — I129 Hypertensive chronic kidney disease with stage 1 through stage 4 chronic kidney disease, or unspecified chronic kidney disease: Secondary | ICD-10-CM | POA: Diagnosis not present

## 2021-05-04 DIAGNOSIS — L89154 Pressure ulcer of sacral region, stage 4: Secondary | ICD-10-CM | POA: Diagnosis not present

## 2021-05-04 DIAGNOSIS — Z7982 Long term (current) use of aspirin: Secondary | ICD-10-CM | POA: Diagnosis not present

## 2021-05-04 DIAGNOSIS — Z79891 Long term (current) use of opiate analgesic: Secondary | ICD-10-CM | POA: Diagnosis not present

## 2021-05-04 DIAGNOSIS — E1022 Type 1 diabetes mellitus with diabetic chronic kidney disease: Secondary | ICD-10-CM | POA: Diagnosis not present

## 2021-05-04 DIAGNOSIS — Z7952 Long term (current) use of systemic steroids: Secondary | ICD-10-CM | POA: Diagnosis not present

## 2021-05-04 DIAGNOSIS — R339 Retention of urine, unspecified: Secondary | ICD-10-CM | POA: Diagnosis not present

## 2021-05-04 DIAGNOSIS — E1021 Type 1 diabetes mellitus with diabetic nephropathy: Secondary | ICD-10-CM | POA: Diagnosis not present

## 2021-05-04 DIAGNOSIS — C519 Malignant neoplasm of vulva, unspecified: Secondary | ICD-10-CM | POA: Diagnosis not present

## 2021-05-04 DIAGNOSIS — Z9483 Pancreas transplant status: Secondary | ICD-10-CM | POA: Diagnosis not present

## 2021-05-04 DIAGNOSIS — Z940 Kidney transplant status: Secondary | ICD-10-CM | POA: Diagnosis not present

## 2021-05-05 DIAGNOSIS — E1040 Type 1 diabetes mellitus with diabetic neuropathy, unspecified: Secondary | ICD-10-CM | POA: Diagnosis not present

## 2021-05-05 DIAGNOSIS — L89154 Pressure ulcer of sacral region, stage 4: Secondary | ICD-10-CM | POA: Diagnosis not present

## 2021-05-05 DIAGNOSIS — N1831 Chronic kidney disease, stage 3a: Secondary | ICD-10-CM | POA: Diagnosis not present

## 2021-05-05 DIAGNOSIS — E1065 Type 1 diabetes mellitus with hyperglycemia: Secondary | ICD-10-CM | POA: Diagnosis not present

## 2021-05-05 DIAGNOSIS — E1021 Type 1 diabetes mellitus with diabetic nephropathy: Secondary | ICD-10-CM | POA: Diagnosis not present

## 2021-05-06 DIAGNOSIS — Z7952 Long term (current) use of systemic steroids: Secondary | ICD-10-CM | POA: Diagnosis not present

## 2021-05-06 DIAGNOSIS — C519 Malignant neoplasm of vulva, unspecified: Secondary | ICD-10-CM | POA: Diagnosis not present

## 2021-05-06 DIAGNOSIS — Z9483 Pancreas transplant status: Secondary | ICD-10-CM | POA: Diagnosis not present

## 2021-05-06 DIAGNOSIS — N183 Chronic kidney disease, stage 3 unspecified: Secondary | ICD-10-CM | POA: Diagnosis not present

## 2021-05-06 DIAGNOSIS — E1022 Type 1 diabetes mellitus with diabetic chronic kidney disease: Secondary | ICD-10-CM | POA: Diagnosis not present

## 2021-05-06 DIAGNOSIS — D649 Anemia, unspecified: Secondary | ICD-10-CM | POA: Diagnosis not present

## 2021-05-06 DIAGNOSIS — Z89431 Acquired absence of right foot: Secondary | ICD-10-CM | POA: Diagnosis not present

## 2021-05-06 DIAGNOSIS — Z940 Kidney transplant status: Secondary | ICD-10-CM | POA: Diagnosis not present

## 2021-05-06 DIAGNOSIS — Z435 Encounter for attention to cystostomy: Secondary | ICD-10-CM | POA: Diagnosis not present

## 2021-05-06 DIAGNOSIS — L89154 Pressure ulcer of sacral region, stage 4: Secondary | ICD-10-CM | POA: Diagnosis not present

## 2021-05-06 DIAGNOSIS — L89322 Pressure ulcer of left buttock, stage 2: Secondary | ICD-10-CM | POA: Diagnosis not present

## 2021-05-06 DIAGNOSIS — Z7982 Long term (current) use of aspirin: Secondary | ICD-10-CM | POA: Diagnosis not present

## 2021-05-06 DIAGNOSIS — Z79891 Long term (current) use of opiate analgesic: Secondary | ICD-10-CM | POA: Diagnosis not present

## 2021-05-06 DIAGNOSIS — R339 Retention of urine, unspecified: Secondary | ICD-10-CM | POA: Diagnosis not present

## 2021-05-06 DIAGNOSIS — F321 Major depressive disorder, single episode, moderate: Secondary | ICD-10-CM | POA: Diagnosis not present

## 2021-05-06 DIAGNOSIS — I129 Hypertensive chronic kidney disease with stage 1 through stage 4 chronic kidney disease, or unspecified chronic kidney disease: Secondary | ICD-10-CM | POA: Diagnosis not present

## 2021-05-07 ENCOUNTER — Ambulatory Visit (INDEPENDENT_AMBULATORY_CARE_PROVIDER_SITE_OTHER): Payer: BC Managed Care – PPO | Admitting: Otolaryngology

## 2021-05-07 DIAGNOSIS — L89154 Pressure ulcer of sacral region, stage 4: Secondary | ICD-10-CM | POA: Diagnosis not present

## 2021-05-08 DIAGNOSIS — L89322 Pressure ulcer of left buttock, stage 2: Secondary | ICD-10-CM | POA: Diagnosis not present

## 2021-05-08 DIAGNOSIS — Z79891 Long term (current) use of opiate analgesic: Secondary | ICD-10-CM | POA: Diagnosis not present

## 2021-05-08 DIAGNOSIS — L89154 Pressure ulcer of sacral region, stage 4: Secondary | ICD-10-CM | POA: Diagnosis not present

## 2021-05-08 DIAGNOSIS — Z7982 Long term (current) use of aspirin: Secondary | ICD-10-CM | POA: Diagnosis not present

## 2021-05-08 DIAGNOSIS — C519 Malignant neoplasm of vulva, unspecified: Secondary | ICD-10-CM | POA: Diagnosis not present

## 2021-05-08 DIAGNOSIS — F321 Major depressive disorder, single episode, moderate: Secondary | ICD-10-CM | POA: Diagnosis not present

## 2021-05-08 DIAGNOSIS — Z940 Kidney transplant status: Secondary | ICD-10-CM | POA: Diagnosis not present

## 2021-05-08 DIAGNOSIS — Z7952 Long term (current) use of systemic steroids: Secondary | ICD-10-CM | POA: Diagnosis not present

## 2021-05-08 DIAGNOSIS — R339 Retention of urine, unspecified: Secondary | ICD-10-CM | POA: Diagnosis not present

## 2021-05-08 DIAGNOSIS — Z435 Encounter for attention to cystostomy: Secondary | ICD-10-CM | POA: Diagnosis not present

## 2021-05-08 DIAGNOSIS — E1022 Type 1 diabetes mellitus with diabetic chronic kidney disease: Secondary | ICD-10-CM | POA: Diagnosis not present

## 2021-05-08 DIAGNOSIS — Z9483 Pancreas transplant status: Secondary | ICD-10-CM | POA: Diagnosis not present

## 2021-05-08 DIAGNOSIS — Z89431 Acquired absence of right foot: Secondary | ICD-10-CM | POA: Diagnosis not present

## 2021-05-08 DIAGNOSIS — I129 Hypertensive chronic kidney disease with stage 1 through stage 4 chronic kidney disease, or unspecified chronic kidney disease: Secondary | ICD-10-CM | POA: Diagnosis not present

## 2021-05-08 DIAGNOSIS — D649 Anemia, unspecified: Secondary | ICD-10-CM | POA: Diagnosis not present

## 2021-05-08 DIAGNOSIS — N183 Chronic kidney disease, stage 3 unspecified: Secondary | ICD-10-CM | POA: Diagnosis not present

## 2021-05-09 DIAGNOSIS — L89154 Pressure ulcer of sacral region, stage 4: Secondary | ICD-10-CM | POA: Diagnosis not present

## 2021-05-10 DIAGNOSIS — L89154 Pressure ulcer of sacral region, stage 4: Secondary | ICD-10-CM | POA: Diagnosis not present

## 2021-05-11 DIAGNOSIS — L89322 Pressure ulcer of left buttock, stage 2: Secondary | ICD-10-CM | POA: Diagnosis not present

## 2021-05-11 DIAGNOSIS — R339 Retention of urine, unspecified: Secondary | ICD-10-CM | POA: Diagnosis not present

## 2021-05-11 DIAGNOSIS — Z89431 Acquired absence of right foot: Secondary | ICD-10-CM | POA: Diagnosis not present

## 2021-05-11 DIAGNOSIS — Z7982 Long term (current) use of aspirin: Secondary | ICD-10-CM | POA: Diagnosis not present

## 2021-05-11 DIAGNOSIS — Z79891 Long term (current) use of opiate analgesic: Secondary | ICD-10-CM | POA: Diagnosis not present

## 2021-05-11 DIAGNOSIS — D649 Anemia, unspecified: Secondary | ICD-10-CM | POA: Diagnosis not present

## 2021-05-11 DIAGNOSIS — N183 Chronic kidney disease, stage 3 unspecified: Secondary | ICD-10-CM | POA: Diagnosis not present

## 2021-05-11 DIAGNOSIS — Z9483 Pancreas transplant status: Secondary | ICD-10-CM | POA: Diagnosis not present

## 2021-05-11 DIAGNOSIS — E1022 Type 1 diabetes mellitus with diabetic chronic kidney disease: Secondary | ICD-10-CM | POA: Diagnosis not present

## 2021-05-11 DIAGNOSIS — I129 Hypertensive chronic kidney disease with stage 1 through stage 4 chronic kidney disease, or unspecified chronic kidney disease: Secondary | ICD-10-CM | POA: Diagnosis not present

## 2021-05-11 DIAGNOSIS — Z940 Kidney transplant status: Secondary | ICD-10-CM | POA: Diagnosis not present

## 2021-05-11 DIAGNOSIS — L89154 Pressure ulcer of sacral region, stage 4: Secondary | ICD-10-CM | POA: Diagnosis not present

## 2021-05-11 DIAGNOSIS — F321 Major depressive disorder, single episode, moderate: Secondary | ICD-10-CM | POA: Diagnosis not present

## 2021-05-11 DIAGNOSIS — Z7952 Long term (current) use of systemic steroids: Secondary | ICD-10-CM | POA: Diagnosis not present

## 2021-05-11 DIAGNOSIS — C519 Malignant neoplasm of vulva, unspecified: Secondary | ICD-10-CM | POA: Diagnosis not present

## 2021-05-11 DIAGNOSIS — Z435 Encounter for attention to cystostomy: Secondary | ICD-10-CM | POA: Diagnosis not present

## 2021-05-12 DIAGNOSIS — F321 Major depressive disorder, single episode, moderate: Secondary | ICD-10-CM | POA: Diagnosis not present

## 2021-05-12 DIAGNOSIS — L89154 Pressure ulcer of sacral region, stage 4: Secondary | ICD-10-CM | POA: Diagnosis not present

## 2021-05-12 DIAGNOSIS — N183 Chronic kidney disease, stage 3 unspecified: Secondary | ICD-10-CM | POA: Diagnosis not present

## 2021-05-12 DIAGNOSIS — E1065 Type 1 diabetes mellitus with hyperglycemia: Secondary | ICD-10-CM | POA: Diagnosis not present

## 2021-05-12 DIAGNOSIS — Z89511 Acquired absence of right leg below knee: Secondary | ICD-10-CM | POA: Diagnosis not present

## 2021-05-12 DIAGNOSIS — E1022 Type 1 diabetes mellitus with diabetic chronic kidney disease: Secondary | ICD-10-CM | POA: Diagnosis not present

## 2021-05-13 DIAGNOSIS — N183 Chronic kidney disease, stage 3 unspecified: Secondary | ICD-10-CM | POA: Diagnosis not present

## 2021-05-13 DIAGNOSIS — Z7952 Long term (current) use of systemic steroids: Secondary | ICD-10-CM | POA: Diagnosis not present

## 2021-05-13 DIAGNOSIS — E1022 Type 1 diabetes mellitus with diabetic chronic kidney disease: Secondary | ICD-10-CM | POA: Diagnosis not present

## 2021-05-13 DIAGNOSIS — F321 Major depressive disorder, single episode, moderate: Secondary | ICD-10-CM | POA: Diagnosis not present

## 2021-05-13 DIAGNOSIS — Z9483 Pancreas transplant status: Secondary | ICD-10-CM | POA: Diagnosis not present

## 2021-05-13 DIAGNOSIS — C519 Malignant neoplasm of vulva, unspecified: Secondary | ICD-10-CM | POA: Diagnosis not present

## 2021-05-13 DIAGNOSIS — L89154 Pressure ulcer of sacral region, stage 4: Secondary | ICD-10-CM | POA: Diagnosis not present

## 2021-05-13 DIAGNOSIS — Z940 Kidney transplant status: Secondary | ICD-10-CM | POA: Diagnosis not present

## 2021-05-13 DIAGNOSIS — Z89431 Acquired absence of right foot: Secondary | ICD-10-CM | POA: Diagnosis not present

## 2021-05-13 DIAGNOSIS — I129 Hypertensive chronic kidney disease with stage 1 through stage 4 chronic kidney disease, or unspecified chronic kidney disease: Secondary | ICD-10-CM | POA: Diagnosis not present

## 2021-05-13 DIAGNOSIS — Z7982 Long term (current) use of aspirin: Secondary | ICD-10-CM | POA: Diagnosis not present

## 2021-05-13 DIAGNOSIS — Z79891 Long term (current) use of opiate analgesic: Secondary | ICD-10-CM | POA: Diagnosis not present

## 2021-05-13 DIAGNOSIS — R339 Retention of urine, unspecified: Secondary | ICD-10-CM | POA: Diagnosis not present

## 2021-05-13 DIAGNOSIS — L89322 Pressure ulcer of left buttock, stage 2: Secondary | ICD-10-CM | POA: Diagnosis not present

## 2021-05-13 DIAGNOSIS — D649 Anemia, unspecified: Secondary | ICD-10-CM | POA: Diagnosis not present

## 2021-05-13 DIAGNOSIS — Z435 Encounter for attention to cystostomy: Secondary | ICD-10-CM | POA: Diagnosis not present

## 2021-05-14 DIAGNOSIS — L89154 Pressure ulcer of sacral region, stage 4: Secondary | ICD-10-CM | POA: Diagnosis not present

## 2021-05-15 DIAGNOSIS — L89154 Pressure ulcer of sacral region, stage 4: Secondary | ICD-10-CM | POA: Diagnosis not present

## 2021-05-16 DIAGNOSIS — L89154 Pressure ulcer of sacral region, stage 4: Secondary | ICD-10-CM | POA: Diagnosis not present

## 2021-05-17 DIAGNOSIS — L89154 Pressure ulcer of sacral region, stage 4: Secondary | ICD-10-CM | POA: Diagnosis not present

## 2021-05-18 DIAGNOSIS — Z89431 Acquired absence of right foot: Secondary | ICD-10-CM | POA: Diagnosis not present

## 2021-05-18 DIAGNOSIS — N183 Chronic kidney disease, stage 3 unspecified: Secondary | ICD-10-CM | POA: Diagnosis not present

## 2021-05-18 DIAGNOSIS — L89154 Pressure ulcer of sacral region, stage 4: Secondary | ICD-10-CM | POA: Diagnosis not present

## 2021-05-18 DIAGNOSIS — Z435 Encounter for attention to cystostomy: Secondary | ICD-10-CM | POA: Diagnosis not present

## 2021-05-18 DIAGNOSIS — Z7982 Long term (current) use of aspirin: Secondary | ICD-10-CM | POA: Diagnosis not present

## 2021-05-18 DIAGNOSIS — E1022 Type 1 diabetes mellitus with diabetic chronic kidney disease: Secondary | ICD-10-CM | POA: Diagnosis not present

## 2021-05-18 DIAGNOSIS — L89322 Pressure ulcer of left buttock, stage 2: Secondary | ICD-10-CM | POA: Diagnosis not present

## 2021-05-18 DIAGNOSIS — C519 Malignant neoplasm of vulva, unspecified: Secondary | ICD-10-CM | POA: Diagnosis not present

## 2021-05-18 DIAGNOSIS — F321 Major depressive disorder, single episode, moderate: Secondary | ICD-10-CM | POA: Diagnosis not present

## 2021-05-18 DIAGNOSIS — R339 Retention of urine, unspecified: Secondary | ICD-10-CM | POA: Diagnosis not present

## 2021-05-18 DIAGNOSIS — D649 Anemia, unspecified: Secondary | ICD-10-CM | POA: Diagnosis not present

## 2021-05-18 DIAGNOSIS — Z7952 Long term (current) use of systemic steroids: Secondary | ICD-10-CM | POA: Diagnosis not present

## 2021-05-18 DIAGNOSIS — Z940 Kidney transplant status: Secondary | ICD-10-CM | POA: Diagnosis not present

## 2021-05-18 DIAGNOSIS — Z79891 Long term (current) use of opiate analgesic: Secondary | ICD-10-CM | POA: Diagnosis not present

## 2021-05-18 DIAGNOSIS — I129 Hypertensive chronic kidney disease with stage 1 through stage 4 chronic kidney disease, or unspecified chronic kidney disease: Secondary | ICD-10-CM | POA: Diagnosis not present

## 2021-05-18 DIAGNOSIS — Z9483 Pancreas transplant status: Secondary | ICD-10-CM | POA: Diagnosis not present

## 2021-05-19 DIAGNOSIS — S98911S Complete traumatic amputation of right foot, level unspecified, sequela: Secondary | ICD-10-CM | POA: Diagnosis not present

## 2021-05-19 DIAGNOSIS — N1831 Chronic kidney disease, stage 3a: Secondary | ICD-10-CM | POA: Diagnosis not present

## 2021-05-19 DIAGNOSIS — E1021 Type 1 diabetes mellitus with diabetic nephropathy: Secondary | ICD-10-CM | POA: Diagnosis not present

## 2021-05-19 DIAGNOSIS — L89154 Pressure ulcer of sacral region, stage 4: Secondary | ICD-10-CM | POA: Diagnosis not present

## 2021-05-20 DIAGNOSIS — E1069 Type 1 diabetes mellitus with other specified complication: Secondary | ICD-10-CM | POA: Diagnosis not present

## 2021-05-20 DIAGNOSIS — F321 Major depressive disorder, single episode, moderate: Secondary | ICD-10-CM | POA: Diagnosis not present

## 2021-05-20 DIAGNOSIS — R339 Retention of urine, unspecified: Secondary | ICD-10-CM | POA: Diagnosis not present

## 2021-05-20 DIAGNOSIS — Z79891 Long term (current) use of opiate analgesic: Secondary | ICD-10-CM | POA: Diagnosis not present

## 2021-05-20 DIAGNOSIS — Z7952 Long term (current) use of systemic steroids: Secondary | ICD-10-CM | POA: Diagnosis not present

## 2021-05-20 DIAGNOSIS — Z993 Dependence on wheelchair: Secondary | ICD-10-CM | POA: Diagnosis not present

## 2021-05-20 DIAGNOSIS — Z435 Encounter for attention to cystostomy: Secondary | ICD-10-CM | POA: Diagnosis not present

## 2021-05-20 DIAGNOSIS — K219 Gastro-esophageal reflux disease without esophagitis: Secondary | ICD-10-CM | POA: Diagnosis not present

## 2021-05-20 DIAGNOSIS — I129 Hypertensive chronic kidney disease with stage 1 through stage 4 chronic kidney disease, or unspecified chronic kidney disease: Secondary | ICD-10-CM | POA: Diagnosis not present

## 2021-05-20 DIAGNOSIS — Z7982 Long term (current) use of aspirin: Secondary | ICD-10-CM | POA: Diagnosis not present

## 2021-05-20 DIAGNOSIS — D631 Anemia in chronic kidney disease: Secondary | ICD-10-CM | POA: Diagnosis not present

## 2021-05-20 DIAGNOSIS — C519 Malignant neoplasm of vulva, unspecified: Secondary | ICD-10-CM | POA: Diagnosis not present

## 2021-05-20 DIAGNOSIS — N1831 Chronic kidney disease, stage 3a: Secondary | ICD-10-CM | POA: Diagnosis not present

## 2021-05-20 DIAGNOSIS — E785 Hyperlipidemia, unspecified: Secondary | ICD-10-CM | POA: Diagnosis not present

## 2021-05-20 DIAGNOSIS — E1022 Type 1 diabetes mellitus with diabetic chronic kidney disease: Secondary | ICD-10-CM | POA: Diagnosis not present

## 2021-05-20 DIAGNOSIS — L89154 Pressure ulcer of sacral region, stage 4: Secondary | ICD-10-CM | POA: Diagnosis not present

## 2021-05-21 DIAGNOSIS — L89154 Pressure ulcer of sacral region, stage 4: Secondary | ICD-10-CM | POA: Diagnosis not present

## 2021-05-22 DIAGNOSIS — Z993 Dependence on wheelchair: Secondary | ICD-10-CM | POA: Diagnosis not present

## 2021-05-22 DIAGNOSIS — E1022 Type 1 diabetes mellitus with diabetic chronic kidney disease: Secondary | ICD-10-CM | POA: Diagnosis not present

## 2021-05-22 DIAGNOSIS — K219 Gastro-esophageal reflux disease without esophagitis: Secondary | ICD-10-CM | POA: Diagnosis not present

## 2021-05-22 DIAGNOSIS — Z79891 Long term (current) use of opiate analgesic: Secondary | ICD-10-CM | POA: Diagnosis not present

## 2021-05-22 DIAGNOSIS — Z7952 Long term (current) use of systemic steroids: Secondary | ICD-10-CM | POA: Diagnosis not present

## 2021-05-22 DIAGNOSIS — Z435 Encounter for attention to cystostomy: Secondary | ICD-10-CM | POA: Diagnosis not present

## 2021-05-22 DIAGNOSIS — F321 Major depressive disorder, single episode, moderate: Secondary | ICD-10-CM | POA: Diagnosis not present

## 2021-05-22 DIAGNOSIS — R339 Retention of urine, unspecified: Secondary | ICD-10-CM | POA: Diagnosis not present

## 2021-05-22 DIAGNOSIS — D631 Anemia in chronic kidney disease: Secondary | ICD-10-CM | POA: Diagnosis not present

## 2021-05-22 DIAGNOSIS — E785 Hyperlipidemia, unspecified: Secondary | ICD-10-CM | POA: Diagnosis not present

## 2021-05-22 DIAGNOSIS — N1831 Chronic kidney disease, stage 3a: Secondary | ICD-10-CM | POA: Diagnosis not present

## 2021-05-22 DIAGNOSIS — Z7982 Long term (current) use of aspirin: Secondary | ICD-10-CM | POA: Diagnosis not present

## 2021-05-22 DIAGNOSIS — L89154 Pressure ulcer of sacral region, stage 4: Secondary | ICD-10-CM | POA: Diagnosis not present

## 2021-05-22 DIAGNOSIS — E1069 Type 1 diabetes mellitus with other specified complication: Secondary | ICD-10-CM | POA: Diagnosis not present

## 2021-05-22 DIAGNOSIS — I129 Hypertensive chronic kidney disease with stage 1 through stage 4 chronic kidney disease, or unspecified chronic kidney disease: Secondary | ICD-10-CM | POA: Diagnosis not present

## 2021-05-22 DIAGNOSIS — C519 Malignant neoplasm of vulva, unspecified: Secondary | ICD-10-CM | POA: Diagnosis not present

## 2021-05-23 DIAGNOSIS — L89154 Pressure ulcer of sacral region, stage 4: Secondary | ICD-10-CM | POA: Diagnosis not present

## 2021-05-24 DIAGNOSIS — L89154 Pressure ulcer of sacral region, stage 4: Secondary | ICD-10-CM | POA: Diagnosis not present

## 2021-05-25 DIAGNOSIS — R339 Retention of urine, unspecified: Secondary | ICD-10-CM | POA: Diagnosis not present

## 2021-05-25 DIAGNOSIS — E1022 Type 1 diabetes mellitus with diabetic chronic kidney disease: Secondary | ICD-10-CM | POA: Diagnosis not present

## 2021-05-25 DIAGNOSIS — Z7952 Long term (current) use of systemic steroids: Secondary | ICD-10-CM | POA: Diagnosis not present

## 2021-05-25 DIAGNOSIS — F321 Major depressive disorder, single episode, moderate: Secondary | ICD-10-CM | POA: Diagnosis not present

## 2021-05-25 DIAGNOSIS — I129 Hypertensive chronic kidney disease with stage 1 through stage 4 chronic kidney disease, or unspecified chronic kidney disease: Secondary | ICD-10-CM | POA: Diagnosis not present

## 2021-05-25 DIAGNOSIS — C519 Malignant neoplasm of vulva, unspecified: Secondary | ICD-10-CM | POA: Diagnosis not present

## 2021-05-25 DIAGNOSIS — Z993 Dependence on wheelchair: Secondary | ICD-10-CM | POA: Diagnosis not present

## 2021-05-25 DIAGNOSIS — Z7982 Long term (current) use of aspirin: Secondary | ICD-10-CM | POA: Diagnosis not present

## 2021-05-25 DIAGNOSIS — K219 Gastro-esophageal reflux disease without esophagitis: Secondary | ICD-10-CM | POA: Diagnosis not present

## 2021-05-25 DIAGNOSIS — L89154 Pressure ulcer of sacral region, stage 4: Secondary | ICD-10-CM | POA: Diagnosis not present

## 2021-05-25 DIAGNOSIS — Z435 Encounter for attention to cystostomy: Secondary | ICD-10-CM | POA: Diagnosis not present

## 2021-05-25 DIAGNOSIS — Z79891 Long term (current) use of opiate analgesic: Secondary | ICD-10-CM | POA: Diagnosis not present

## 2021-05-25 DIAGNOSIS — D631 Anemia in chronic kidney disease: Secondary | ICD-10-CM | POA: Diagnosis not present

## 2021-05-25 DIAGNOSIS — E785 Hyperlipidemia, unspecified: Secondary | ICD-10-CM | POA: Diagnosis not present

## 2021-05-25 DIAGNOSIS — E1069 Type 1 diabetes mellitus with other specified complication: Secondary | ICD-10-CM | POA: Diagnosis not present

## 2021-05-25 DIAGNOSIS — N1831 Chronic kidney disease, stage 3a: Secondary | ICD-10-CM | POA: Diagnosis not present

## 2021-05-26 ENCOUNTER — Telehealth: Payer: Self-pay | Admitting: Nurse Practitioner

## 2021-05-26 DIAGNOSIS — E785 Hyperlipidemia, unspecified: Secondary | ICD-10-CM | POA: Diagnosis not present

## 2021-05-26 DIAGNOSIS — L89154 Pressure ulcer of sacral region, stage 4: Secondary | ICD-10-CM | POA: Diagnosis not present

## 2021-05-26 DIAGNOSIS — D631 Anemia in chronic kidney disease: Secondary | ICD-10-CM | POA: Diagnosis not present

## 2021-05-26 DIAGNOSIS — N1831 Chronic kidney disease, stage 3a: Secondary | ICD-10-CM | POA: Diagnosis not present

## 2021-05-26 DIAGNOSIS — E1021 Type 1 diabetes mellitus with diabetic nephropathy: Secondary | ICD-10-CM | POA: Diagnosis not present

## 2021-05-26 DIAGNOSIS — E1069 Type 1 diabetes mellitus with other specified complication: Secondary | ICD-10-CM | POA: Diagnosis not present

## 2021-05-26 DIAGNOSIS — I10 Essential (primary) hypertension: Secondary | ICD-10-CM | POA: Diagnosis not present

## 2021-05-26 DIAGNOSIS — N183 Chronic kidney disease, stage 3 unspecified: Secondary | ICD-10-CM | POA: Diagnosis not present

## 2021-05-27 DIAGNOSIS — Z7982 Long term (current) use of aspirin: Secondary | ICD-10-CM | POA: Diagnosis not present

## 2021-05-27 DIAGNOSIS — R339 Retention of urine, unspecified: Secondary | ICD-10-CM | POA: Diagnosis not present

## 2021-05-27 DIAGNOSIS — Z7952 Long term (current) use of systemic steroids: Secondary | ICD-10-CM | POA: Diagnosis not present

## 2021-05-27 DIAGNOSIS — C519 Malignant neoplasm of vulva, unspecified: Secondary | ICD-10-CM | POA: Diagnosis not present

## 2021-05-27 DIAGNOSIS — K219 Gastro-esophageal reflux disease without esophagitis: Secondary | ICD-10-CM | POA: Diagnosis not present

## 2021-05-27 DIAGNOSIS — E1022 Type 1 diabetes mellitus with diabetic chronic kidney disease: Secondary | ICD-10-CM | POA: Diagnosis not present

## 2021-05-27 DIAGNOSIS — Z435 Encounter for attention to cystostomy: Secondary | ICD-10-CM | POA: Diagnosis not present

## 2021-05-27 DIAGNOSIS — L89154 Pressure ulcer of sacral region, stage 4: Secondary | ICD-10-CM | POA: Diagnosis not present

## 2021-05-27 DIAGNOSIS — Z993 Dependence on wheelchair: Secondary | ICD-10-CM | POA: Diagnosis not present

## 2021-05-27 DIAGNOSIS — N1831 Chronic kidney disease, stage 3a: Secondary | ICD-10-CM | POA: Diagnosis not present

## 2021-05-27 DIAGNOSIS — E1069 Type 1 diabetes mellitus with other specified complication: Secondary | ICD-10-CM | POA: Diagnosis not present

## 2021-05-27 DIAGNOSIS — E785 Hyperlipidemia, unspecified: Secondary | ICD-10-CM | POA: Diagnosis not present

## 2021-05-27 DIAGNOSIS — F321 Major depressive disorder, single episode, moderate: Secondary | ICD-10-CM | POA: Diagnosis not present

## 2021-05-27 DIAGNOSIS — D631 Anemia in chronic kidney disease: Secondary | ICD-10-CM | POA: Diagnosis not present

## 2021-05-27 DIAGNOSIS — Z79891 Long term (current) use of opiate analgesic: Secondary | ICD-10-CM | POA: Diagnosis not present

## 2021-05-27 DIAGNOSIS — I129 Hypertensive chronic kidney disease with stage 1 through stage 4 chronic kidney disease, or unspecified chronic kidney disease: Secondary | ICD-10-CM | POA: Diagnosis not present

## 2021-05-28 DIAGNOSIS — L89154 Pressure ulcer of sacral region, stage 4: Secondary | ICD-10-CM | POA: Diagnosis not present

## 2021-05-29 DIAGNOSIS — I129 Hypertensive chronic kidney disease with stage 1 through stage 4 chronic kidney disease, or unspecified chronic kidney disease: Secondary | ICD-10-CM | POA: Diagnosis not present

## 2021-05-29 DIAGNOSIS — C519 Malignant neoplasm of vulva, unspecified: Secondary | ICD-10-CM | POA: Diagnosis not present

## 2021-05-29 DIAGNOSIS — L89154 Pressure ulcer of sacral region, stage 4: Secondary | ICD-10-CM | POA: Diagnosis not present

## 2021-05-29 DIAGNOSIS — Z993 Dependence on wheelchair: Secondary | ICD-10-CM | POA: Diagnosis not present

## 2021-05-29 DIAGNOSIS — D631 Anemia in chronic kidney disease: Secondary | ICD-10-CM | POA: Diagnosis not present

## 2021-05-29 DIAGNOSIS — E785 Hyperlipidemia, unspecified: Secondary | ICD-10-CM | POA: Diagnosis not present

## 2021-05-29 DIAGNOSIS — K219 Gastro-esophageal reflux disease without esophagitis: Secondary | ICD-10-CM | POA: Diagnosis not present

## 2021-05-29 DIAGNOSIS — E1022 Type 1 diabetes mellitus with diabetic chronic kidney disease: Secondary | ICD-10-CM | POA: Diagnosis not present

## 2021-05-29 DIAGNOSIS — E1069 Type 1 diabetes mellitus with other specified complication: Secondary | ICD-10-CM | POA: Diagnosis not present

## 2021-05-29 DIAGNOSIS — Z435 Encounter for attention to cystostomy: Secondary | ICD-10-CM | POA: Diagnosis not present

## 2021-05-29 DIAGNOSIS — N1831 Chronic kidney disease, stage 3a: Secondary | ICD-10-CM | POA: Diagnosis not present

## 2021-05-29 DIAGNOSIS — F321 Major depressive disorder, single episode, moderate: Secondary | ICD-10-CM | POA: Diagnosis not present

## 2021-05-29 DIAGNOSIS — Z7952 Long term (current) use of systemic steroids: Secondary | ICD-10-CM | POA: Diagnosis not present

## 2021-05-29 DIAGNOSIS — Z79891 Long term (current) use of opiate analgesic: Secondary | ICD-10-CM | POA: Diagnosis not present

## 2021-05-29 DIAGNOSIS — R339 Retention of urine, unspecified: Secondary | ICD-10-CM | POA: Diagnosis not present

## 2021-05-29 DIAGNOSIS — Z7982 Long term (current) use of aspirin: Secondary | ICD-10-CM | POA: Diagnosis not present

## 2021-05-30 DIAGNOSIS — L89154 Pressure ulcer of sacral region, stage 4: Secondary | ICD-10-CM | POA: Diagnosis not present

## 2021-05-31 DIAGNOSIS — L89154 Pressure ulcer of sacral region, stage 4: Secondary | ICD-10-CM | POA: Diagnosis not present

## 2021-06-01 DIAGNOSIS — I129 Hypertensive chronic kidney disease with stage 1 through stage 4 chronic kidney disease, or unspecified chronic kidney disease: Secondary | ICD-10-CM | POA: Diagnosis not present

## 2021-06-01 DIAGNOSIS — C519 Malignant neoplasm of vulva, unspecified: Secondary | ICD-10-CM | POA: Diagnosis not present

## 2021-06-01 DIAGNOSIS — D631 Anemia in chronic kidney disease: Secondary | ICD-10-CM | POA: Diagnosis not present

## 2021-06-01 DIAGNOSIS — L89154 Pressure ulcer of sacral region, stage 4: Secondary | ICD-10-CM | POA: Diagnosis not present

## 2021-06-01 DIAGNOSIS — Z993 Dependence on wheelchair: Secondary | ICD-10-CM | POA: Diagnosis not present

## 2021-06-01 DIAGNOSIS — Z7952 Long term (current) use of systemic steroids: Secondary | ICD-10-CM | POA: Diagnosis not present

## 2021-06-01 DIAGNOSIS — F321 Major depressive disorder, single episode, moderate: Secondary | ICD-10-CM | POA: Diagnosis not present

## 2021-06-01 DIAGNOSIS — E1069 Type 1 diabetes mellitus with other specified complication: Secondary | ICD-10-CM | POA: Diagnosis not present

## 2021-06-01 DIAGNOSIS — K219 Gastro-esophageal reflux disease without esophagitis: Secondary | ICD-10-CM | POA: Diagnosis not present

## 2021-06-01 DIAGNOSIS — N1831 Chronic kidney disease, stage 3a: Secondary | ICD-10-CM | POA: Diagnosis not present

## 2021-06-01 DIAGNOSIS — E785 Hyperlipidemia, unspecified: Secondary | ICD-10-CM | POA: Diagnosis not present

## 2021-06-01 DIAGNOSIS — Z7982 Long term (current) use of aspirin: Secondary | ICD-10-CM | POA: Diagnosis not present

## 2021-06-01 DIAGNOSIS — E1022 Type 1 diabetes mellitus with diabetic chronic kidney disease: Secondary | ICD-10-CM | POA: Diagnosis not present

## 2021-06-01 DIAGNOSIS — R339 Retention of urine, unspecified: Secondary | ICD-10-CM | POA: Diagnosis not present

## 2021-06-01 DIAGNOSIS — Z79891 Long term (current) use of opiate analgesic: Secondary | ICD-10-CM | POA: Diagnosis not present

## 2021-06-01 DIAGNOSIS — Z435 Encounter for attention to cystostomy: Secondary | ICD-10-CM | POA: Diagnosis not present

## 2021-06-02 DIAGNOSIS — E785 Hyperlipidemia, unspecified: Secondary | ICD-10-CM | POA: Diagnosis not present

## 2021-06-02 DIAGNOSIS — L89154 Pressure ulcer of sacral region, stage 4: Secondary | ICD-10-CM | POA: Diagnosis not present

## 2021-06-02 DIAGNOSIS — E1069 Type 1 diabetes mellitus with other specified complication: Secondary | ICD-10-CM | POA: Diagnosis not present

## 2021-06-03 DIAGNOSIS — D631 Anemia in chronic kidney disease: Secondary | ICD-10-CM | POA: Diagnosis not present

## 2021-06-03 DIAGNOSIS — Z7952 Long term (current) use of systemic steroids: Secondary | ICD-10-CM | POA: Diagnosis not present

## 2021-06-03 DIAGNOSIS — N1831 Chronic kidney disease, stage 3a: Secondary | ICD-10-CM | POA: Diagnosis not present

## 2021-06-03 DIAGNOSIS — Z79891 Long term (current) use of opiate analgesic: Secondary | ICD-10-CM | POA: Diagnosis not present

## 2021-06-03 DIAGNOSIS — C519 Malignant neoplasm of vulva, unspecified: Secondary | ICD-10-CM | POA: Diagnosis not present

## 2021-06-03 DIAGNOSIS — E1022 Type 1 diabetes mellitus with diabetic chronic kidney disease: Secondary | ICD-10-CM | POA: Diagnosis not present

## 2021-06-03 DIAGNOSIS — I129 Hypertensive chronic kidney disease with stage 1 through stage 4 chronic kidney disease, or unspecified chronic kidney disease: Secondary | ICD-10-CM | POA: Diagnosis not present

## 2021-06-03 DIAGNOSIS — K219 Gastro-esophageal reflux disease without esophagitis: Secondary | ICD-10-CM | POA: Diagnosis not present

## 2021-06-03 DIAGNOSIS — Z993 Dependence on wheelchair: Secondary | ICD-10-CM | POA: Diagnosis not present

## 2021-06-03 DIAGNOSIS — L89154 Pressure ulcer of sacral region, stage 4: Secondary | ICD-10-CM | POA: Diagnosis not present

## 2021-06-03 DIAGNOSIS — E785 Hyperlipidemia, unspecified: Secondary | ICD-10-CM | POA: Diagnosis not present

## 2021-06-03 DIAGNOSIS — Z435 Encounter for attention to cystostomy: Secondary | ICD-10-CM | POA: Diagnosis not present

## 2021-06-03 DIAGNOSIS — R339 Retention of urine, unspecified: Secondary | ICD-10-CM | POA: Diagnosis not present

## 2021-06-03 DIAGNOSIS — F321 Major depressive disorder, single episode, moderate: Secondary | ICD-10-CM | POA: Diagnosis not present

## 2021-06-03 DIAGNOSIS — Z7982 Long term (current) use of aspirin: Secondary | ICD-10-CM | POA: Diagnosis not present

## 2021-06-03 DIAGNOSIS — E1069 Type 1 diabetes mellitus with other specified complication: Secondary | ICD-10-CM | POA: Diagnosis not present

## 2021-06-04 DIAGNOSIS — E1069 Type 1 diabetes mellitus with other specified complication: Secondary | ICD-10-CM | POA: Diagnosis not present

## 2021-06-04 DIAGNOSIS — Z993 Dependence on wheelchair: Secondary | ICD-10-CM | POA: Diagnosis not present

## 2021-06-04 DIAGNOSIS — Z435 Encounter for attention to cystostomy: Secondary | ICD-10-CM | POA: Diagnosis not present

## 2021-06-04 DIAGNOSIS — C519 Malignant neoplasm of vulva, unspecified: Secondary | ICD-10-CM | POA: Diagnosis not present

## 2021-06-04 DIAGNOSIS — E785 Hyperlipidemia, unspecified: Secondary | ICD-10-CM | POA: Diagnosis not present

## 2021-06-04 DIAGNOSIS — L89154 Pressure ulcer of sacral region, stage 4: Secondary | ICD-10-CM | POA: Diagnosis not present

## 2021-06-04 DIAGNOSIS — F321 Major depressive disorder, single episode, moderate: Secondary | ICD-10-CM | POA: Diagnosis not present

## 2021-06-04 DIAGNOSIS — R339 Retention of urine, unspecified: Secondary | ICD-10-CM | POA: Diagnosis not present

## 2021-06-04 DIAGNOSIS — D631 Anemia in chronic kidney disease: Secondary | ICD-10-CM | POA: Diagnosis not present

## 2021-06-04 DIAGNOSIS — K219 Gastro-esophageal reflux disease without esophagitis: Secondary | ICD-10-CM | POA: Diagnosis not present

## 2021-06-04 DIAGNOSIS — E1022 Type 1 diabetes mellitus with diabetic chronic kidney disease: Secondary | ICD-10-CM | POA: Diagnosis not present

## 2021-06-04 DIAGNOSIS — Z79891 Long term (current) use of opiate analgesic: Secondary | ICD-10-CM | POA: Diagnosis not present

## 2021-06-04 DIAGNOSIS — I129 Hypertensive chronic kidney disease with stage 1 through stage 4 chronic kidney disease, or unspecified chronic kidney disease: Secondary | ICD-10-CM | POA: Diagnosis not present

## 2021-06-04 DIAGNOSIS — N1831 Chronic kidney disease, stage 3a: Secondary | ICD-10-CM | POA: Diagnosis not present

## 2021-06-04 DIAGNOSIS — Z7982 Long term (current) use of aspirin: Secondary | ICD-10-CM | POA: Diagnosis not present

## 2021-06-04 DIAGNOSIS — Z7952 Long term (current) use of systemic steroids: Secondary | ICD-10-CM | POA: Diagnosis not present

## 2021-06-05 DIAGNOSIS — Z7952 Long term (current) use of systemic steroids: Secondary | ICD-10-CM | POA: Diagnosis not present

## 2021-06-05 DIAGNOSIS — R339 Retention of urine, unspecified: Secondary | ICD-10-CM | POA: Diagnosis not present

## 2021-06-05 DIAGNOSIS — I129 Hypertensive chronic kidney disease with stage 1 through stage 4 chronic kidney disease, or unspecified chronic kidney disease: Secondary | ICD-10-CM | POA: Diagnosis not present

## 2021-06-05 DIAGNOSIS — L89154 Pressure ulcer of sacral region, stage 4: Secondary | ICD-10-CM | POA: Diagnosis not present

## 2021-06-05 DIAGNOSIS — Z79891 Long term (current) use of opiate analgesic: Secondary | ICD-10-CM | POA: Diagnosis not present

## 2021-06-05 DIAGNOSIS — E1022 Type 1 diabetes mellitus with diabetic chronic kidney disease: Secondary | ICD-10-CM | POA: Diagnosis not present

## 2021-06-05 DIAGNOSIS — F321 Major depressive disorder, single episode, moderate: Secondary | ICD-10-CM | POA: Diagnosis not present

## 2021-06-05 DIAGNOSIS — Z435 Encounter for attention to cystostomy: Secondary | ICD-10-CM | POA: Diagnosis not present

## 2021-06-05 DIAGNOSIS — E1069 Type 1 diabetes mellitus with other specified complication: Secondary | ICD-10-CM | POA: Diagnosis not present

## 2021-06-05 DIAGNOSIS — D631 Anemia in chronic kidney disease: Secondary | ICD-10-CM | POA: Diagnosis not present

## 2021-06-05 DIAGNOSIS — N1831 Chronic kidney disease, stage 3a: Secondary | ICD-10-CM | POA: Diagnosis not present

## 2021-06-05 DIAGNOSIS — K219 Gastro-esophageal reflux disease without esophagitis: Secondary | ICD-10-CM | POA: Diagnosis not present

## 2021-06-05 DIAGNOSIS — Z993 Dependence on wheelchair: Secondary | ICD-10-CM | POA: Diagnosis not present

## 2021-06-05 DIAGNOSIS — Z7982 Long term (current) use of aspirin: Secondary | ICD-10-CM | POA: Diagnosis not present

## 2021-06-05 DIAGNOSIS — C519 Malignant neoplasm of vulva, unspecified: Secondary | ICD-10-CM | POA: Diagnosis not present

## 2021-06-05 DIAGNOSIS — E785 Hyperlipidemia, unspecified: Secondary | ICD-10-CM | POA: Diagnosis not present

## 2021-06-06 DIAGNOSIS — L89154 Pressure ulcer of sacral region, stage 4: Secondary | ICD-10-CM | POA: Diagnosis not present

## 2021-06-07 DIAGNOSIS — L89154 Pressure ulcer of sacral region, stage 4: Secondary | ICD-10-CM | POA: Diagnosis not present

## 2021-06-08 DIAGNOSIS — Z7982 Long term (current) use of aspirin: Secondary | ICD-10-CM | POA: Diagnosis not present

## 2021-06-08 DIAGNOSIS — I129 Hypertensive chronic kidney disease with stage 1 through stage 4 chronic kidney disease, or unspecified chronic kidney disease: Secondary | ICD-10-CM | POA: Diagnosis not present

## 2021-06-08 DIAGNOSIS — E1022 Type 1 diabetes mellitus with diabetic chronic kidney disease: Secondary | ICD-10-CM | POA: Diagnosis not present

## 2021-06-08 DIAGNOSIS — Z7952 Long term (current) use of systemic steroids: Secondary | ICD-10-CM | POA: Diagnosis not present

## 2021-06-08 DIAGNOSIS — D631 Anemia in chronic kidney disease: Secondary | ICD-10-CM | POA: Diagnosis not present

## 2021-06-08 DIAGNOSIS — K219 Gastro-esophageal reflux disease without esophagitis: Secondary | ICD-10-CM | POA: Diagnosis not present

## 2021-06-08 DIAGNOSIS — Z993 Dependence on wheelchair: Secondary | ICD-10-CM | POA: Diagnosis not present

## 2021-06-08 DIAGNOSIS — E1069 Type 1 diabetes mellitus with other specified complication: Secondary | ICD-10-CM | POA: Diagnosis not present

## 2021-06-08 DIAGNOSIS — R339 Retention of urine, unspecified: Secondary | ICD-10-CM | POA: Diagnosis not present

## 2021-06-08 DIAGNOSIS — F321 Major depressive disorder, single episode, moderate: Secondary | ICD-10-CM | POA: Diagnosis not present

## 2021-06-08 DIAGNOSIS — Z435 Encounter for attention to cystostomy: Secondary | ICD-10-CM | POA: Diagnosis not present

## 2021-06-08 DIAGNOSIS — C519 Malignant neoplasm of vulva, unspecified: Secondary | ICD-10-CM | POA: Diagnosis not present

## 2021-06-08 DIAGNOSIS — E785 Hyperlipidemia, unspecified: Secondary | ICD-10-CM | POA: Diagnosis not present

## 2021-06-08 DIAGNOSIS — L89154 Pressure ulcer of sacral region, stage 4: Secondary | ICD-10-CM | POA: Diagnosis not present

## 2021-06-08 DIAGNOSIS — N1831 Chronic kidney disease, stage 3a: Secondary | ICD-10-CM | POA: Diagnosis not present

## 2021-06-08 DIAGNOSIS — Z79891 Long term (current) use of opiate analgesic: Secondary | ICD-10-CM | POA: Diagnosis not present

## 2021-06-09 DIAGNOSIS — E785 Hyperlipidemia, unspecified: Secondary | ICD-10-CM | POA: Diagnosis not present

## 2021-06-09 DIAGNOSIS — E1021 Type 1 diabetes mellitus with diabetic nephropathy: Secondary | ICD-10-CM | POA: Diagnosis not present

## 2021-06-09 DIAGNOSIS — G546 Phantom limb syndrome with pain: Secondary | ICD-10-CM | POA: Diagnosis not present

## 2021-06-09 DIAGNOSIS — L89154 Pressure ulcer of sacral region, stage 4: Secondary | ICD-10-CM | POA: Diagnosis not present

## 2021-06-09 DIAGNOSIS — E1069 Type 1 diabetes mellitus with other specified complication: Secondary | ICD-10-CM | POA: Diagnosis not present

## 2021-06-10 DIAGNOSIS — L89154 Pressure ulcer of sacral region, stage 4: Secondary | ICD-10-CM | POA: Diagnosis not present

## 2021-06-11 ENCOUNTER — Other Ambulatory Visit (INDEPENDENT_AMBULATORY_CARE_PROVIDER_SITE_OTHER): Payer: Self-pay | Admitting: Otolaryngology

## 2021-06-11 DIAGNOSIS — L89154 Pressure ulcer of sacral region, stage 4: Secondary | ICD-10-CM | POA: Diagnosis not present

## 2021-06-12 DIAGNOSIS — Z79891 Long term (current) use of opiate analgesic: Secondary | ICD-10-CM | POA: Diagnosis not present

## 2021-06-12 DIAGNOSIS — L89154 Pressure ulcer of sacral region, stage 4: Secondary | ICD-10-CM | POA: Diagnosis not present

## 2021-06-12 DIAGNOSIS — E1022 Type 1 diabetes mellitus with diabetic chronic kidney disease: Secondary | ICD-10-CM | POA: Diagnosis not present

## 2021-06-12 DIAGNOSIS — E1069 Type 1 diabetes mellitus with other specified complication: Secondary | ICD-10-CM | POA: Diagnosis not present

## 2021-06-12 DIAGNOSIS — F321 Major depressive disorder, single episode, moderate: Secondary | ICD-10-CM | POA: Diagnosis not present

## 2021-06-12 DIAGNOSIS — Z7982 Long term (current) use of aspirin: Secondary | ICD-10-CM | POA: Diagnosis not present

## 2021-06-12 DIAGNOSIS — K219 Gastro-esophageal reflux disease without esophagitis: Secondary | ICD-10-CM | POA: Diagnosis not present

## 2021-06-12 DIAGNOSIS — N1831 Chronic kidney disease, stage 3a: Secondary | ICD-10-CM | POA: Diagnosis not present

## 2021-06-12 DIAGNOSIS — R339 Retention of urine, unspecified: Secondary | ICD-10-CM | POA: Diagnosis not present

## 2021-06-12 DIAGNOSIS — D631 Anemia in chronic kidney disease: Secondary | ICD-10-CM | POA: Diagnosis not present

## 2021-06-12 DIAGNOSIS — Z993 Dependence on wheelchair: Secondary | ICD-10-CM | POA: Diagnosis not present

## 2021-06-12 DIAGNOSIS — C519 Malignant neoplasm of vulva, unspecified: Secondary | ICD-10-CM | POA: Diagnosis not present

## 2021-06-12 DIAGNOSIS — Z7952 Long term (current) use of systemic steroids: Secondary | ICD-10-CM | POA: Diagnosis not present

## 2021-06-12 DIAGNOSIS — E785 Hyperlipidemia, unspecified: Secondary | ICD-10-CM | POA: Diagnosis not present

## 2021-06-12 DIAGNOSIS — Z435 Encounter for attention to cystostomy: Secondary | ICD-10-CM | POA: Diagnosis not present

## 2021-06-12 DIAGNOSIS — I129 Hypertensive chronic kidney disease with stage 1 through stage 4 chronic kidney disease, or unspecified chronic kidney disease: Secondary | ICD-10-CM | POA: Diagnosis not present

## 2021-06-13 DIAGNOSIS — L89154 Pressure ulcer of sacral region, stage 4: Secondary | ICD-10-CM | POA: Diagnosis not present

## 2021-06-14 DIAGNOSIS — L89154 Pressure ulcer of sacral region, stage 4: Secondary | ICD-10-CM | POA: Diagnosis not present

## 2021-06-15 DIAGNOSIS — E1022 Type 1 diabetes mellitus with diabetic chronic kidney disease: Secondary | ICD-10-CM | POA: Diagnosis not present

## 2021-06-15 DIAGNOSIS — Z79891 Long term (current) use of opiate analgesic: Secondary | ICD-10-CM | POA: Diagnosis not present

## 2021-06-15 DIAGNOSIS — N1831 Chronic kidney disease, stage 3a: Secondary | ICD-10-CM | POA: Diagnosis not present

## 2021-06-15 DIAGNOSIS — Z993 Dependence on wheelchair: Secondary | ICD-10-CM | POA: Diagnosis not present

## 2021-06-15 DIAGNOSIS — Z7982 Long term (current) use of aspirin: Secondary | ICD-10-CM | POA: Diagnosis not present

## 2021-06-15 DIAGNOSIS — K219 Gastro-esophageal reflux disease without esophagitis: Secondary | ICD-10-CM | POA: Diagnosis not present

## 2021-06-15 DIAGNOSIS — E1069 Type 1 diabetes mellitus with other specified complication: Secondary | ICD-10-CM | POA: Diagnosis not present

## 2021-06-15 DIAGNOSIS — Z7952 Long term (current) use of systemic steroids: Secondary | ICD-10-CM | POA: Diagnosis not present

## 2021-06-15 DIAGNOSIS — D631 Anemia in chronic kidney disease: Secondary | ICD-10-CM | POA: Diagnosis not present

## 2021-06-15 DIAGNOSIS — L89154 Pressure ulcer of sacral region, stage 4: Secondary | ICD-10-CM | POA: Diagnosis not present

## 2021-06-15 DIAGNOSIS — E785 Hyperlipidemia, unspecified: Secondary | ICD-10-CM | POA: Diagnosis not present

## 2021-06-15 DIAGNOSIS — I129 Hypertensive chronic kidney disease with stage 1 through stage 4 chronic kidney disease, or unspecified chronic kidney disease: Secondary | ICD-10-CM | POA: Diagnosis not present

## 2021-06-15 DIAGNOSIS — F321 Major depressive disorder, single episode, moderate: Secondary | ICD-10-CM | POA: Diagnosis not present

## 2021-06-15 DIAGNOSIS — R339 Retention of urine, unspecified: Secondary | ICD-10-CM | POA: Diagnosis not present

## 2021-06-15 DIAGNOSIS — Z435 Encounter for attention to cystostomy: Secondary | ICD-10-CM | POA: Diagnosis not present

## 2021-06-15 DIAGNOSIS — C519 Malignant neoplasm of vulva, unspecified: Secondary | ICD-10-CM | POA: Diagnosis not present

## 2021-06-16 DIAGNOSIS — E1022 Type 1 diabetes mellitus with diabetic chronic kidney disease: Secondary | ICD-10-CM | POA: Diagnosis not present

## 2021-06-16 DIAGNOSIS — E10319 Type 1 diabetes mellitus with unspecified diabetic retinopathy without macular edema: Secondary | ICD-10-CM | POA: Diagnosis not present

## 2021-06-16 DIAGNOSIS — E1040 Type 1 diabetes mellitus with diabetic neuropathy, unspecified: Secondary | ICD-10-CM | POA: Diagnosis not present

## 2021-06-16 DIAGNOSIS — E785 Hyperlipidemia, unspecified: Secondary | ICD-10-CM | POA: Diagnosis not present

## 2021-06-16 DIAGNOSIS — E039 Hypothyroidism, unspecified: Secondary | ICD-10-CM | POA: Diagnosis not present

## 2021-06-16 DIAGNOSIS — E1069 Type 1 diabetes mellitus with other specified complication: Secondary | ICD-10-CM | POA: Diagnosis not present

## 2021-06-16 DIAGNOSIS — I10 Essential (primary) hypertension: Secondary | ICD-10-CM | POA: Diagnosis not present

## 2021-06-16 DIAGNOSIS — L89154 Pressure ulcer of sacral region, stage 4: Secondary | ICD-10-CM | POA: Diagnosis not present

## 2021-06-16 DIAGNOSIS — E1065 Type 1 diabetes mellitus with hyperglycemia: Secondary | ICD-10-CM | POA: Diagnosis not present

## 2021-06-17 DIAGNOSIS — Z79891 Long term (current) use of opiate analgesic: Secondary | ICD-10-CM | POA: Diagnosis not present

## 2021-06-17 DIAGNOSIS — N1831 Chronic kidney disease, stage 3a: Secondary | ICD-10-CM | POA: Diagnosis not present

## 2021-06-17 DIAGNOSIS — Z7982 Long term (current) use of aspirin: Secondary | ICD-10-CM | POA: Diagnosis not present

## 2021-06-17 DIAGNOSIS — Z7952 Long term (current) use of systemic steroids: Secondary | ICD-10-CM | POA: Diagnosis not present

## 2021-06-17 DIAGNOSIS — L98499 Non-pressure chronic ulcer of skin of other sites with unspecified severity: Secondary | ICD-10-CM | POA: Diagnosis not present

## 2021-06-17 DIAGNOSIS — R339 Retention of urine, unspecified: Secondary | ICD-10-CM | POA: Diagnosis not present

## 2021-06-17 DIAGNOSIS — Z993 Dependence on wheelchair: Secondary | ICD-10-CM | POA: Diagnosis not present

## 2021-06-17 DIAGNOSIS — L89154 Pressure ulcer of sacral region, stage 4: Secondary | ICD-10-CM | POA: Diagnosis not present

## 2021-06-17 DIAGNOSIS — E785 Hyperlipidemia, unspecified: Secondary | ICD-10-CM | POA: Diagnosis not present

## 2021-06-17 DIAGNOSIS — T8619 Other complication of kidney transplant: Secondary | ICD-10-CM | POA: Diagnosis not present

## 2021-06-17 DIAGNOSIS — I129 Hypertensive chronic kidney disease with stage 1 through stage 4 chronic kidney disease, or unspecified chronic kidney disease: Secondary | ICD-10-CM | POA: Diagnosis not present

## 2021-06-17 DIAGNOSIS — E1069 Type 1 diabetes mellitus with other specified complication: Secondary | ICD-10-CM | POA: Diagnosis not present

## 2021-06-17 DIAGNOSIS — F321 Major depressive disorder, single episode, moderate: Secondary | ICD-10-CM | POA: Diagnosis not present

## 2021-06-17 DIAGNOSIS — C519 Malignant neoplasm of vulva, unspecified: Secondary | ICD-10-CM | POA: Diagnosis not present

## 2021-06-17 DIAGNOSIS — E1021 Type 1 diabetes mellitus with diabetic nephropathy: Secondary | ICD-10-CM | POA: Diagnosis not present

## 2021-06-17 DIAGNOSIS — K219 Gastro-esophageal reflux disease without esophagitis: Secondary | ICD-10-CM | POA: Diagnosis not present

## 2021-06-17 DIAGNOSIS — Z435 Encounter for attention to cystostomy: Secondary | ICD-10-CM | POA: Diagnosis not present

## 2021-06-17 DIAGNOSIS — D631 Anemia in chronic kidney disease: Secondary | ICD-10-CM | POA: Diagnosis not present

## 2021-06-17 DIAGNOSIS — E1022 Type 1 diabetes mellitus with diabetic chronic kidney disease: Secondary | ICD-10-CM | POA: Diagnosis not present

## 2021-06-18 DIAGNOSIS — L89154 Pressure ulcer of sacral region, stage 4: Secondary | ICD-10-CM | POA: Diagnosis not present

## 2021-06-19 ENCOUNTER — Encounter (HOSPITAL_BASED_OUTPATIENT_CLINIC_OR_DEPARTMENT_OTHER): Payer: BC Managed Care – PPO | Attending: Internal Medicine | Admitting: Internal Medicine

## 2021-06-19 ENCOUNTER — Other Ambulatory Visit: Payer: Self-pay

## 2021-06-19 DIAGNOSIS — N186 End stage renal disease: Secondary | ICD-10-CM | POA: Insufficient documentation

## 2021-06-19 DIAGNOSIS — L89153 Pressure ulcer of sacral region, stage 3: Secondary | ICD-10-CM | POA: Insufficient documentation

## 2021-06-19 DIAGNOSIS — Z435 Encounter for attention to cystostomy: Secondary | ICD-10-CM | POA: Diagnosis not present

## 2021-06-19 DIAGNOSIS — E1022 Type 1 diabetes mellitus with diabetic chronic kidney disease: Secondary | ICD-10-CM | POA: Diagnosis not present

## 2021-06-19 DIAGNOSIS — F321 Major depressive disorder, single episode, moderate: Secondary | ICD-10-CM | POA: Diagnosis not present

## 2021-06-19 DIAGNOSIS — E10622 Type 1 diabetes mellitus with other skin ulcer: Secondary | ICD-10-CM | POA: Insufficient documentation

## 2021-06-19 DIAGNOSIS — D631 Anemia in chronic kidney disease: Secondary | ICD-10-CM | POA: Diagnosis not present

## 2021-06-19 DIAGNOSIS — E1040 Type 1 diabetes mellitus with diabetic neuropathy, unspecified: Secondary | ICD-10-CM | POA: Insufficient documentation

## 2021-06-19 DIAGNOSIS — Z79891 Long term (current) use of opiate analgesic: Secondary | ICD-10-CM | POA: Diagnosis not present

## 2021-06-19 DIAGNOSIS — I129 Hypertensive chronic kidney disease with stage 1 through stage 4 chronic kidney disease, or unspecified chronic kidney disease: Secondary | ICD-10-CM | POA: Diagnosis not present

## 2021-06-19 DIAGNOSIS — Z833 Family history of diabetes mellitus: Secondary | ICD-10-CM | POA: Insufficient documentation

## 2021-06-19 DIAGNOSIS — L89154 Pressure ulcer of sacral region, stage 4: Secondary | ICD-10-CM | POA: Diagnosis not present

## 2021-06-19 DIAGNOSIS — E785 Hyperlipidemia, unspecified: Secondary | ICD-10-CM | POA: Diagnosis not present

## 2021-06-19 DIAGNOSIS — K219 Gastro-esophageal reflux disease without esophagitis: Secondary | ICD-10-CM | POA: Diagnosis not present

## 2021-06-19 DIAGNOSIS — N1831 Chronic kidney disease, stage 3a: Secondary | ICD-10-CM | POA: Diagnosis not present

## 2021-06-19 DIAGNOSIS — Z8249 Family history of ischemic heart disease and other diseases of the circulatory system: Secondary | ICD-10-CM | POA: Diagnosis not present

## 2021-06-19 DIAGNOSIS — E1069 Type 1 diabetes mellitus with other specified complication: Secondary | ICD-10-CM | POA: Diagnosis not present

## 2021-06-19 DIAGNOSIS — E109 Type 1 diabetes mellitus without complications: Secondary | ICD-10-CM

## 2021-06-19 DIAGNOSIS — Z89511 Acquired absence of right leg below knee: Secondary | ICD-10-CM

## 2021-06-19 DIAGNOSIS — Z87891 Personal history of nicotine dependence: Secondary | ICD-10-CM | POA: Diagnosis not present

## 2021-06-19 DIAGNOSIS — Z993 Dependence on wheelchair: Secondary | ICD-10-CM | POA: Diagnosis not present

## 2021-06-19 DIAGNOSIS — C519 Malignant neoplasm of vulva, unspecified: Secondary | ICD-10-CM | POA: Diagnosis not present

## 2021-06-19 DIAGNOSIS — Z7952 Long term (current) use of systemic steroids: Secondary | ICD-10-CM | POA: Diagnosis not present

## 2021-06-19 DIAGNOSIS — E11622 Type 2 diabetes mellitus with other skin ulcer: Secondary | ICD-10-CM | POA: Diagnosis not present

## 2021-06-19 DIAGNOSIS — R339 Retention of urine, unspecified: Secondary | ICD-10-CM | POA: Diagnosis not present

## 2021-06-19 DIAGNOSIS — Z940 Kidney transplant status: Secondary | ICD-10-CM | POA: Diagnosis not present

## 2021-06-19 DIAGNOSIS — Z7982 Long term (current) use of aspirin: Secondary | ICD-10-CM | POA: Diagnosis not present

## 2021-06-20 DIAGNOSIS — L89154 Pressure ulcer of sacral region, stage 4: Secondary | ICD-10-CM | POA: Diagnosis not present

## 2021-06-21 DIAGNOSIS — L89154 Pressure ulcer of sacral region, stage 4: Secondary | ICD-10-CM | POA: Diagnosis not present

## 2021-06-22 DIAGNOSIS — L89154 Pressure ulcer of sacral region, stage 4: Secondary | ICD-10-CM | POA: Diagnosis not present

## 2021-06-22 DIAGNOSIS — N189 Chronic kidney disease, unspecified: Secondary | ICD-10-CM | POA: Diagnosis not present

## 2021-06-22 DIAGNOSIS — Z7952 Long term (current) use of systemic steroids: Secondary | ICD-10-CM | POA: Diagnosis not present

## 2021-06-22 DIAGNOSIS — K219 Gastro-esophageal reflux disease without esophagitis: Secondary | ICD-10-CM | POA: Diagnosis not present

## 2021-06-22 DIAGNOSIS — E785 Hyperlipidemia, unspecified: Secondary | ICD-10-CM | POA: Diagnosis not present

## 2021-06-22 DIAGNOSIS — I129 Hypertensive chronic kidney disease with stage 1 through stage 4 chronic kidney disease, or unspecified chronic kidney disease: Secondary | ICD-10-CM | POA: Diagnosis not present

## 2021-06-22 DIAGNOSIS — Z79891 Long term (current) use of opiate analgesic: Secondary | ICD-10-CM | POA: Diagnosis not present

## 2021-06-22 DIAGNOSIS — R339 Retention of urine, unspecified: Secondary | ICD-10-CM | POA: Diagnosis not present

## 2021-06-22 DIAGNOSIS — Z435 Encounter for attention to cystostomy: Secondary | ICD-10-CM | POA: Diagnosis not present

## 2021-06-22 DIAGNOSIS — Z993 Dependence on wheelchair: Secondary | ICD-10-CM | POA: Diagnosis not present

## 2021-06-22 DIAGNOSIS — E119 Type 2 diabetes mellitus without complications: Secondary | ICD-10-CM | POA: Diagnosis not present

## 2021-06-22 DIAGNOSIS — Z7982 Long term (current) use of aspirin: Secondary | ICD-10-CM | POA: Diagnosis not present

## 2021-06-22 DIAGNOSIS — C519 Malignant neoplasm of vulva, unspecified: Secondary | ICD-10-CM | POA: Diagnosis not present

## 2021-06-22 DIAGNOSIS — F321 Major depressive disorder, single episode, moderate: Secondary | ICD-10-CM | POA: Diagnosis not present

## 2021-06-22 DIAGNOSIS — D631 Anemia in chronic kidney disease: Secondary | ICD-10-CM | POA: Diagnosis not present

## 2021-06-22 DIAGNOSIS — E1022 Type 1 diabetes mellitus with diabetic chronic kidney disease: Secondary | ICD-10-CM | POA: Diagnosis not present

## 2021-06-22 DIAGNOSIS — E1069 Type 1 diabetes mellitus with other specified complication: Secondary | ICD-10-CM | POA: Diagnosis not present

## 2021-06-22 DIAGNOSIS — N1831 Chronic kidney disease, stage 3a: Secondary | ICD-10-CM | POA: Diagnosis not present

## 2021-06-23 DIAGNOSIS — L89154 Pressure ulcer of sacral region, stage 4: Secondary | ICD-10-CM | POA: Diagnosis not present

## 2021-06-24 DIAGNOSIS — F321 Major depressive disorder, single episode, moderate: Secondary | ICD-10-CM | POA: Diagnosis not present

## 2021-06-24 DIAGNOSIS — D631 Anemia in chronic kidney disease: Secondary | ICD-10-CM | POA: Diagnosis not present

## 2021-06-24 DIAGNOSIS — E1069 Type 1 diabetes mellitus with other specified complication: Secondary | ICD-10-CM | POA: Diagnosis not present

## 2021-06-24 DIAGNOSIS — E785 Hyperlipidemia, unspecified: Secondary | ICD-10-CM | POA: Diagnosis not present

## 2021-06-24 DIAGNOSIS — L89154 Pressure ulcer of sacral region, stage 4: Secondary | ICD-10-CM | POA: Diagnosis not present

## 2021-06-24 DIAGNOSIS — N1831 Chronic kidney disease, stage 3a: Secondary | ICD-10-CM | POA: Diagnosis not present

## 2021-06-24 DIAGNOSIS — Z993 Dependence on wheelchair: Secondary | ICD-10-CM | POA: Diagnosis not present

## 2021-06-24 DIAGNOSIS — C519 Malignant neoplasm of vulva, unspecified: Secondary | ICD-10-CM | POA: Diagnosis not present

## 2021-06-24 DIAGNOSIS — R339 Retention of urine, unspecified: Secondary | ICD-10-CM | POA: Diagnosis not present

## 2021-06-24 DIAGNOSIS — E1021 Type 1 diabetes mellitus with diabetic nephropathy: Secondary | ICD-10-CM | POA: Diagnosis not present

## 2021-06-24 DIAGNOSIS — K219 Gastro-esophageal reflux disease without esophagitis: Secondary | ICD-10-CM | POA: Diagnosis not present

## 2021-06-24 DIAGNOSIS — Z7982 Long term (current) use of aspirin: Secondary | ICD-10-CM | POA: Diagnosis not present

## 2021-06-24 DIAGNOSIS — E1022 Type 1 diabetes mellitus with diabetic chronic kidney disease: Secondary | ICD-10-CM | POA: Diagnosis not present

## 2021-06-24 DIAGNOSIS — Z7952 Long term (current) use of systemic steroids: Secondary | ICD-10-CM | POA: Diagnosis not present

## 2021-06-24 DIAGNOSIS — I129 Hypertensive chronic kidney disease with stage 1 through stage 4 chronic kidney disease, or unspecified chronic kidney disease: Secondary | ICD-10-CM | POA: Diagnosis not present

## 2021-06-24 DIAGNOSIS — Z79891 Long term (current) use of opiate analgesic: Secondary | ICD-10-CM | POA: Diagnosis not present

## 2021-06-24 DIAGNOSIS — Z435 Encounter for attention to cystostomy: Secondary | ICD-10-CM | POA: Diagnosis not present

## 2021-06-25 DIAGNOSIS — L89154 Pressure ulcer of sacral region, stage 4: Secondary | ICD-10-CM | POA: Diagnosis not present

## 2021-06-26 DIAGNOSIS — L89154 Pressure ulcer of sacral region, stage 4: Secondary | ICD-10-CM | POA: Diagnosis not present

## 2021-06-26 DIAGNOSIS — N1831 Chronic kidney disease, stage 3a: Secondary | ICD-10-CM | POA: Diagnosis not present

## 2021-06-26 DIAGNOSIS — E785 Hyperlipidemia, unspecified: Secondary | ICD-10-CM | POA: Diagnosis not present

## 2021-06-26 DIAGNOSIS — R339 Retention of urine, unspecified: Secondary | ICD-10-CM | POA: Diagnosis not present

## 2021-06-26 DIAGNOSIS — C519 Malignant neoplasm of vulva, unspecified: Secondary | ICD-10-CM | POA: Diagnosis not present

## 2021-06-26 DIAGNOSIS — E1069 Type 1 diabetes mellitus with other specified complication: Secondary | ICD-10-CM | POA: Diagnosis not present

## 2021-06-26 DIAGNOSIS — E1022 Type 1 diabetes mellitus with diabetic chronic kidney disease: Secondary | ICD-10-CM | POA: Diagnosis not present

## 2021-06-26 DIAGNOSIS — I129 Hypertensive chronic kidney disease with stage 1 through stage 4 chronic kidney disease, or unspecified chronic kidney disease: Secondary | ICD-10-CM | POA: Diagnosis not present

## 2021-06-26 DIAGNOSIS — Z993 Dependence on wheelchair: Secondary | ICD-10-CM | POA: Diagnosis not present

## 2021-06-26 DIAGNOSIS — Z79891 Long term (current) use of opiate analgesic: Secondary | ICD-10-CM | POA: Diagnosis not present

## 2021-06-26 DIAGNOSIS — Z435 Encounter for attention to cystostomy: Secondary | ICD-10-CM | POA: Diagnosis not present

## 2021-06-26 DIAGNOSIS — Z7982 Long term (current) use of aspirin: Secondary | ICD-10-CM | POA: Diagnosis not present

## 2021-06-26 DIAGNOSIS — D631 Anemia in chronic kidney disease: Secondary | ICD-10-CM | POA: Diagnosis not present

## 2021-06-26 DIAGNOSIS — Z7952 Long term (current) use of systemic steroids: Secondary | ICD-10-CM | POA: Diagnosis not present

## 2021-06-26 DIAGNOSIS — F321 Major depressive disorder, single episode, moderate: Secondary | ICD-10-CM | POA: Diagnosis not present

## 2021-06-26 DIAGNOSIS — K219 Gastro-esophageal reflux disease without esophagitis: Secondary | ICD-10-CM | POA: Diagnosis not present

## 2021-06-27 DIAGNOSIS — L89154 Pressure ulcer of sacral region, stage 4: Secondary | ICD-10-CM | POA: Diagnosis not present

## 2021-06-27 DIAGNOSIS — F321 Major depressive disorder, single episode, moderate: Secondary | ICD-10-CM | POA: Diagnosis not present

## 2021-06-27 DIAGNOSIS — Z7982 Long term (current) use of aspirin: Secondary | ICD-10-CM | POA: Diagnosis not present

## 2021-06-27 DIAGNOSIS — C519 Malignant neoplasm of vulva, unspecified: Secondary | ICD-10-CM | POA: Diagnosis not present

## 2021-06-27 DIAGNOSIS — D631 Anemia in chronic kidney disease: Secondary | ICD-10-CM | POA: Diagnosis not present

## 2021-06-27 DIAGNOSIS — R339 Retention of urine, unspecified: Secondary | ICD-10-CM | POA: Diagnosis not present

## 2021-06-27 DIAGNOSIS — Z993 Dependence on wheelchair: Secondary | ICD-10-CM | POA: Diagnosis not present

## 2021-06-27 DIAGNOSIS — I129 Hypertensive chronic kidney disease with stage 1 through stage 4 chronic kidney disease, or unspecified chronic kidney disease: Secondary | ICD-10-CM | POA: Diagnosis not present

## 2021-06-27 DIAGNOSIS — Z79891 Long term (current) use of opiate analgesic: Secondary | ICD-10-CM | POA: Diagnosis not present

## 2021-06-27 DIAGNOSIS — E1069 Type 1 diabetes mellitus with other specified complication: Secondary | ICD-10-CM | POA: Diagnosis not present

## 2021-06-27 DIAGNOSIS — N1831 Chronic kidney disease, stage 3a: Secondary | ICD-10-CM | POA: Diagnosis not present

## 2021-06-27 DIAGNOSIS — Z7952 Long term (current) use of systemic steroids: Secondary | ICD-10-CM | POA: Diagnosis not present

## 2021-06-27 DIAGNOSIS — K219 Gastro-esophageal reflux disease without esophagitis: Secondary | ICD-10-CM | POA: Diagnosis not present

## 2021-06-27 DIAGNOSIS — Z435 Encounter for attention to cystostomy: Secondary | ICD-10-CM | POA: Diagnosis not present

## 2021-06-27 DIAGNOSIS — E785 Hyperlipidemia, unspecified: Secondary | ICD-10-CM | POA: Diagnosis not present

## 2021-06-27 DIAGNOSIS — E1022 Type 1 diabetes mellitus with diabetic chronic kidney disease: Secondary | ICD-10-CM | POA: Diagnosis not present

## 2021-06-28 DIAGNOSIS — L89154 Pressure ulcer of sacral region, stage 4: Secondary | ICD-10-CM | POA: Diagnosis not present

## 2021-06-29 DIAGNOSIS — I129 Hypertensive chronic kidney disease with stage 1 through stage 4 chronic kidney disease, or unspecified chronic kidney disease: Secondary | ICD-10-CM | POA: Diagnosis not present

## 2021-06-29 DIAGNOSIS — Z7982 Long term (current) use of aspirin: Secondary | ICD-10-CM | POA: Diagnosis not present

## 2021-06-29 DIAGNOSIS — E1069 Type 1 diabetes mellitus with other specified complication: Secondary | ICD-10-CM | POA: Diagnosis not present

## 2021-06-29 DIAGNOSIS — Z435 Encounter for attention to cystostomy: Secondary | ICD-10-CM | POA: Diagnosis not present

## 2021-06-29 DIAGNOSIS — N1831 Chronic kidney disease, stage 3a: Secondary | ICD-10-CM | POA: Diagnosis not present

## 2021-06-29 DIAGNOSIS — E1022 Type 1 diabetes mellitus with diabetic chronic kidney disease: Secondary | ICD-10-CM | POA: Diagnosis not present

## 2021-06-29 DIAGNOSIS — L89154 Pressure ulcer of sacral region, stage 4: Secondary | ICD-10-CM | POA: Diagnosis not present

## 2021-06-29 DIAGNOSIS — Z993 Dependence on wheelchair: Secondary | ICD-10-CM | POA: Diagnosis not present

## 2021-06-29 DIAGNOSIS — D631 Anemia in chronic kidney disease: Secondary | ICD-10-CM | POA: Diagnosis not present

## 2021-06-29 DIAGNOSIS — E785 Hyperlipidemia, unspecified: Secondary | ICD-10-CM | POA: Diagnosis not present

## 2021-06-29 DIAGNOSIS — C519 Malignant neoplasm of vulva, unspecified: Secondary | ICD-10-CM | POA: Diagnosis not present

## 2021-06-29 DIAGNOSIS — Z79891 Long term (current) use of opiate analgesic: Secondary | ICD-10-CM | POA: Diagnosis not present

## 2021-06-29 DIAGNOSIS — Z7952 Long term (current) use of systemic steroids: Secondary | ICD-10-CM | POA: Diagnosis not present

## 2021-06-29 DIAGNOSIS — F321 Major depressive disorder, single episode, moderate: Secondary | ICD-10-CM | POA: Diagnosis not present

## 2021-06-29 DIAGNOSIS — R339 Retention of urine, unspecified: Secondary | ICD-10-CM | POA: Diagnosis not present

## 2021-06-29 DIAGNOSIS — K219 Gastro-esophageal reflux disease without esophagitis: Secondary | ICD-10-CM | POA: Diagnosis not present

## 2021-06-30 DIAGNOSIS — L89154 Pressure ulcer of sacral region, stage 4: Secondary | ICD-10-CM | POA: Diagnosis not present

## 2021-07-01 DIAGNOSIS — Z435 Encounter for attention to cystostomy: Secondary | ICD-10-CM | POA: Diagnosis not present

## 2021-07-01 DIAGNOSIS — R339 Retention of urine, unspecified: Secondary | ICD-10-CM | POA: Diagnosis not present

## 2021-07-01 DIAGNOSIS — Z7952 Long term (current) use of systemic steroids: Secondary | ICD-10-CM | POA: Diagnosis not present

## 2021-07-01 DIAGNOSIS — I129 Hypertensive chronic kidney disease with stage 1 through stage 4 chronic kidney disease, or unspecified chronic kidney disease: Secondary | ICD-10-CM | POA: Diagnosis not present

## 2021-07-01 DIAGNOSIS — E785 Hyperlipidemia, unspecified: Secondary | ICD-10-CM | POA: Diagnosis not present

## 2021-07-01 DIAGNOSIS — Z7982 Long term (current) use of aspirin: Secondary | ICD-10-CM | POA: Diagnosis not present

## 2021-07-01 DIAGNOSIS — C519 Malignant neoplasm of vulva, unspecified: Secondary | ICD-10-CM | POA: Diagnosis not present

## 2021-07-01 DIAGNOSIS — Z79891 Long term (current) use of opiate analgesic: Secondary | ICD-10-CM | POA: Diagnosis not present

## 2021-07-01 DIAGNOSIS — E1069 Type 1 diabetes mellitus with other specified complication: Secondary | ICD-10-CM | POA: Diagnosis not present

## 2021-07-01 DIAGNOSIS — Z993 Dependence on wheelchair: Secondary | ICD-10-CM | POA: Diagnosis not present

## 2021-07-01 DIAGNOSIS — L89154 Pressure ulcer of sacral region, stage 4: Secondary | ICD-10-CM | POA: Diagnosis not present

## 2021-07-01 DIAGNOSIS — D631 Anemia in chronic kidney disease: Secondary | ICD-10-CM | POA: Diagnosis not present

## 2021-07-01 DIAGNOSIS — N1831 Chronic kidney disease, stage 3a: Secondary | ICD-10-CM | POA: Diagnosis not present

## 2021-07-01 DIAGNOSIS — E1022 Type 1 diabetes mellitus with diabetic chronic kidney disease: Secondary | ICD-10-CM | POA: Diagnosis not present

## 2021-07-01 DIAGNOSIS — K219 Gastro-esophageal reflux disease without esophagitis: Secondary | ICD-10-CM | POA: Diagnosis not present

## 2021-07-01 DIAGNOSIS — F321 Major depressive disorder, single episode, moderate: Secondary | ICD-10-CM | POA: Diagnosis not present

## 2021-07-02 DIAGNOSIS — L89322 Pressure ulcer of left buttock, stage 2: Secondary | ICD-10-CM | POA: Diagnosis not present

## 2021-07-02 DIAGNOSIS — L89154 Pressure ulcer of sacral region, stage 4: Secondary | ICD-10-CM | POA: Diagnosis not present

## 2021-07-03 ENCOUNTER — Encounter (HOSPITAL_BASED_OUTPATIENT_CLINIC_OR_DEPARTMENT_OTHER): Payer: BC Managed Care – PPO | Admitting: Internal Medicine

## 2021-07-03 DIAGNOSIS — Z7982 Long term (current) use of aspirin: Secondary | ICD-10-CM | POA: Diagnosis not present

## 2021-07-03 DIAGNOSIS — E1022 Type 1 diabetes mellitus with diabetic chronic kidney disease: Secondary | ICD-10-CM | POA: Diagnosis not present

## 2021-07-03 DIAGNOSIS — R339 Retention of urine, unspecified: Secondary | ICD-10-CM | POA: Diagnosis not present

## 2021-07-03 DIAGNOSIS — F321 Major depressive disorder, single episode, moderate: Secondary | ICD-10-CM | POA: Diagnosis not present

## 2021-07-03 DIAGNOSIS — I129 Hypertensive chronic kidney disease with stage 1 through stage 4 chronic kidney disease, or unspecified chronic kidney disease: Secondary | ICD-10-CM | POA: Diagnosis not present

## 2021-07-03 DIAGNOSIS — E1069 Type 1 diabetes mellitus with other specified complication: Secondary | ICD-10-CM | POA: Diagnosis not present

## 2021-07-03 DIAGNOSIS — C519 Malignant neoplasm of vulva, unspecified: Secondary | ICD-10-CM | POA: Diagnosis not present

## 2021-07-03 DIAGNOSIS — L89154 Pressure ulcer of sacral region, stage 4: Secondary | ICD-10-CM | POA: Diagnosis not present

## 2021-07-03 DIAGNOSIS — Z7952 Long term (current) use of systemic steroids: Secondary | ICD-10-CM | POA: Diagnosis not present

## 2021-07-03 DIAGNOSIS — N1831 Chronic kidney disease, stage 3a: Secondary | ICD-10-CM | POA: Diagnosis not present

## 2021-07-03 DIAGNOSIS — Z79891 Long term (current) use of opiate analgesic: Secondary | ICD-10-CM | POA: Diagnosis not present

## 2021-07-03 DIAGNOSIS — Z435 Encounter for attention to cystostomy: Secondary | ICD-10-CM | POA: Diagnosis not present

## 2021-07-03 DIAGNOSIS — K219 Gastro-esophageal reflux disease without esophagitis: Secondary | ICD-10-CM | POA: Diagnosis not present

## 2021-07-03 DIAGNOSIS — D631 Anemia in chronic kidney disease: Secondary | ICD-10-CM | POA: Diagnosis not present

## 2021-07-03 DIAGNOSIS — E785 Hyperlipidemia, unspecified: Secondary | ICD-10-CM | POA: Diagnosis not present

## 2021-07-03 DIAGNOSIS — Z993 Dependence on wheelchair: Secondary | ICD-10-CM | POA: Diagnosis not present

## 2021-07-04 DIAGNOSIS — L89154 Pressure ulcer of sacral region, stage 4: Secondary | ICD-10-CM | POA: Diagnosis not present

## 2021-07-05 DIAGNOSIS — L89154 Pressure ulcer of sacral region, stage 4: Secondary | ICD-10-CM | POA: Diagnosis not present

## 2021-07-06 ENCOUNTER — Encounter (HOSPITAL_BASED_OUTPATIENT_CLINIC_OR_DEPARTMENT_OTHER): Payer: BC Managed Care – PPO | Attending: Internal Medicine | Admitting: Internal Medicine

## 2021-07-06 ENCOUNTER — Other Ambulatory Visit: Payer: Self-pay

## 2021-07-06 DIAGNOSIS — E1022 Type 1 diabetes mellitus with diabetic chronic kidney disease: Secondary | ICD-10-CM | POA: Insufficient documentation

## 2021-07-06 DIAGNOSIS — L89322 Pressure ulcer of left buttock, stage 2: Secondary | ICD-10-CM | POA: Diagnosis not present

## 2021-07-06 DIAGNOSIS — I120 Hypertensive chronic kidney disease with stage 5 chronic kidney disease or end stage renal disease: Secondary | ICD-10-CM | POA: Insufficient documentation

## 2021-07-06 DIAGNOSIS — E1040 Type 1 diabetes mellitus with diabetic neuropathy, unspecified: Secondary | ICD-10-CM | POA: Insufficient documentation

## 2021-07-06 DIAGNOSIS — N186 End stage renal disease: Secondary | ICD-10-CM | POA: Insufficient documentation

## 2021-07-06 DIAGNOSIS — L89153 Pressure ulcer of sacral region, stage 3: Secondary | ICD-10-CM

## 2021-07-06 DIAGNOSIS — E11622 Type 2 diabetes mellitus with other skin ulcer: Secondary | ICD-10-CM | POA: Diagnosis not present

## 2021-07-06 DIAGNOSIS — L89154 Pressure ulcer of sacral region, stage 4: Secondary | ICD-10-CM | POA: Diagnosis not present

## 2021-07-06 DIAGNOSIS — Z940 Kidney transplant status: Secondary | ICD-10-CM | POA: Diagnosis not present

## 2021-07-06 DIAGNOSIS — Z89511 Acquired absence of right leg below knee: Secondary | ICD-10-CM | POA: Insufficient documentation

## 2021-07-06 DIAGNOSIS — Z87891 Personal history of nicotine dependence: Secondary | ICD-10-CM | POA: Diagnosis not present

## 2021-07-06 DIAGNOSIS — E109 Type 1 diabetes mellitus without complications: Secondary | ICD-10-CM | POA: Diagnosis not present

## 2021-07-06 DIAGNOSIS — E10622 Type 1 diabetes mellitus with other skin ulcer: Secondary | ICD-10-CM | POA: Diagnosis not present

## 2021-07-06 NOTE — Progress Notes (Signed)
K[place], LUCKENBACH ([id number]) Visit Report for 07/06/2021 Arrival Information Details Patient Name: Date of Service: [name], [name] 07/06/2021 1:30 PM Medical Record Number: [id number] Patient Account Number: [id number] Date of Birth/Sex: Treating RN: [birth year] (60 y.o. [name] Primary Care Witney Huie: [name] Other Clinician: Referring Laquan Beier: Treating Janann Boeve/Extender: [name] in Treatment: 2 Visit Information History Since Last Visit Added or deleted any medications: No Patient Arrived: Wheel Chair Any new allergies or adverse reactions: No Arrival Time: 14:00 Had a fall or experienced change in No Accompanied By: son activities of daily living that may affect Transfer Assistance: Manual risk of falls: Patient Identification Verified: Yes Signs or symptoms of abuse/neglect since last visito No Secondary Verification Process Completed: Yes Hospitalized since last visit: No Patient Requires Transmission-Based Precautions: No Implantable device outside of the clinic excluding No Patient Has Alerts: No cellular tissue based products placed in the center since last visit: Has Dressing in Place as Prescribed: Yes Pain Present Now: No Notes Home health applies wound vac at home. Noted no drape under the bridge area nor the bridging had the suction pad to it. Suction pad over wound bed. MD and case manager made aware. Electronic Signature(s) Unsigned Entered By: [name] on 07/06/2021 14:09:30 -------------------------------------------------------------------------------- Lower Extremity Assessment Details Patient Name: Date of Service: [name], [name] 07/06/2021 1:30 PM Medical Record Number: [id number] Patient Account Number: [id number] Date of Birth/Sex: Treating RN: 08-29-1961 (60 y.o. [name] Primary Care Jarreau Callanan: [name] Other Clinician: Referring Levan Aloia: Treating Annagrace Carr/Extender:  [name]: 2 Electronic Signature(s) Unsigned Entered By: [name] on 07/06/2021 14:09:59 Signature(s): Date(s): -------------------------------------------------------------------------------- Multi-Disciplinary Care Plan Details Patient Name: Date of Service: [name], [name] 07/06/2021 1:30 PM Medical Record Number: [id number] Patient Account Number: [id number] Date of Birth/Sex: Treating RN: February 16, 1961 (60 y.o. [name] Primary Care Ragnar Waas: [name] Other Clinician: Referring Azariyah Luhrs: Treating Juelle Dickmann/Extender: [name]: 2 Active Inactive Wound/Skin Impairment Nursing Diagnoses: Impaired tissue integrity Knowledge deficit related to ulceration/compromised skin integrity Goals: Patient/caregiver will verbalize understanding of skin care regimen Date Initiated: 06/19/2021 Target Resolution Date: 07/04/2021 Goal Status: Active Ulcer/skin breakdown will have a volume reduction of 30% by week 4 Date Initiated: 06/19/2021 Target Resolution Date: 07/02/2021 Goal Status: Active Interventions: Assess patient/caregiver ability to obtain necessary supplies Assess patient/caregiver ability to perform ulcer/skin care regimen upon admission and as needed Assess ulceration(s) every visit Provide education on ulcer and skin care Notes: Electronic Signature(s) Signed: 07/06/2021 2:04:46 PM By: [name] Entered By: [name] on 07/06/2021 14:04:46 -------------------------------------------------------------------------------- Pain Assessment Details Patient Name: Date of Service: [name], [name] 07/06/2021 1:30 PM Medical Record Number: [id number] Patient Account Number: [id number] Date of Birth/Sex: Treating RN: 1961-10-10 (60 y.o. [name] Primary Care Zoria Rawlinson: [name] Other Clinician: Referring Carnetta Losada: Treating Adyan Palau/Extender:  [name]: 2 Active Problems Location of Pain Severity and Description of Pain Patient Has Paino No Site Locations Rate the pain. Rate the pain. Current Pain Level: 0 Pain Management and Medication Current Pain Management: Medication: No Cold Application: No Rest: No Massage: No Activity: No T.E.N.S.: No Heat Application: No Leg drop or elevation: No Is the Current Pain Management Adequate: Adequate How does your wound impact your activities of daily livingo Sleep: No Bathing: No Appetite: No Relationship With Others: No Bladder Continence: No Emotions: No Bowel Continence: No Work: No Toileting: No Drive: No Dressing: No Hobbies: No Electronic Signature(s) Unsigned Entered  By: [name] on 07/06/2021 14:09:54 -------------------------------------------------------------------------------- Patient/Caregiver Education Details Patient Name: Date of Service: [name], [name] 7/11/2022andnbsp1:30 PM Medical Record Number: [id number] Patient Account Number: [id number] Date of Birth/Gender: Treating RN: October 16, 1961 (60 y.o. [name] Primary Care Physician: [name] Other Clinician: Referring Physician: Treating Physician/Extender: [name] in Treatment: 2 Education Assessment Education Provided To: Patient Education Topics Provided Wound/Skin Impairment: Methods: Demonstration, Explain/Verbal, Printed Responses: State content correctly Electronic Signature(s) Unsigned Entered By: [name] on 07/06/2021 14:05:08 Signature(s): Date(s): -------------------------------------------------------------------------------- Wound Assessment Details Patient Name: Date of Service: [name], [name] 07/06/2021 1:30 PM Medical Record Number: [id number] Patient Account Number: [id number] Date of Birth/Sex: Treating RN: [birth year] (60 y.o. Helene Shoe, Tammi Klippel Primary Care  Vickii Volland: [name] Other Clinician: Referring Kalli Greenfield: Treating Lynesha Bango/Extender: [name]: 2 Wound Status Wound Number: 1 Primary Etiology: Pressure Ulcer Wound Location: Sacrum Wound Status: Open Wounding Event: Pressure Injury Date Acquired: 02/24/2021 Weeks Of Treatment: 2 Clustered Wound: No Wound Measurements Length: (cm) 7 Width: (cm) 4.5 Depth: (cm) 1.1 Area: (cm) 24.74 Volume: (cm) 27.214 % Reduction in Area: -0.3% % Reduction in Volume: 26.4% Wound Description Classification: Category/Stage III Electronic Signature(s) Unsigned Entered ByDeon Pilling on 07/06/2021 14:10:11 -------------------------------------------------------------------------------- Vitals Details Patient Name: Date of Service: [name], [name] 07/06/2021 1:30 PM Medical Record Number: [id number] Patient Account Number: [id number] Date of Birth/Sex: Treating RN: [birth year] (60 y.o. Helene Shoe, Tammi Klippel Primary Care Heavenlee Maiorana: [name] Other Clinician: Referring Lindy Garczynski: Treating Troye Hiemstra/Extender: [name]: 2 Vital Signs Time Taken: 14:00 Temperature (F): 98.4 Height (in): 53 Pulse (bpm): 90 Weight (lbs): 110 Respiratory Rate (breaths/min): 18 Body Mass Index (BMI): 27.5 Blood Pressure (mmHg): 113/60 Capillary Blood Glucose (mg/dl): 127 Reference Range: 80 - 120 mg / dl Electronic Signature(s) Unsigned Entered By: [name] on 07/06/2021 14:09:46 Signature(s): Date(s):

## 2021-07-07 DIAGNOSIS — Z993 Dependence on wheelchair: Secondary | ICD-10-CM | POA: Diagnosis not present

## 2021-07-07 DIAGNOSIS — K219 Gastro-esophageal reflux disease without esophagitis: Secondary | ICD-10-CM | POA: Diagnosis not present

## 2021-07-07 DIAGNOSIS — C519 Malignant neoplasm of vulva, unspecified: Secondary | ICD-10-CM | POA: Diagnosis not present

## 2021-07-07 DIAGNOSIS — R339 Retention of urine, unspecified: Secondary | ICD-10-CM | POA: Diagnosis not present

## 2021-07-07 DIAGNOSIS — L89154 Pressure ulcer of sacral region, stage 4: Secondary | ICD-10-CM | POA: Diagnosis not present

## 2021-07-07 DIAGNOSIS — Z7952 Long term (current) use of systemic steroids: Secondary | ICD-10-CM | POA: Diagnosis not present

## 2021-07-07 DIAGNOSIS — F321 Major depressive disorder, single episode, moderate: Secondary | ICD-10-CM | POA: Diagnosis not present

## 2021-07-07 DIAGNOSIS — N1831 Chronic kidney disease, stage 3a: Secondary | ICD-10-CM | POA: Diagnosis not present

## 2021-07-07 DIAGNOSIS — E1069 Type 1 diabetes mellitus with other specified complication: Secondary | ICD-10-CM | POA: Diagnosis not present

## 2021-07-07 DIAGNOSIS — Z7982 Long term (current) use of aspirin: Secondary | ICD-10-CM | POA: Diagnosis not present

## 2021-07-07 DIAGNOSIS — D631 Anemia in chronic kidney disease: Secondary | ICD-10-CM | POA: Diagnosis not present

## 2021-07-07 DIAGNOSIS — Z435 Encounter for attention to cystostomy: Secondary | ICD-10-CM | POA: Diagnosis not present

## 2021-07-07 DIAGNOSIS — Z79891 Long term (current) use of opiate analgesic: Secondary | ICD-10-CM | POA: Diagnosis not present

## 2021-07-07 DIAGNOSIS — E1022 Type 1 diabetes mellitus with diabetic chronic kidney disease: Secondary | ICD-10-CM | POA: Diagnosis not present

## 2021-07-07 DIAGNOSIS — E785 Hyperlipidemia, unspecified: Secondary | ICD-10-CM | POA: Diagnosis not present

## 2021-07-07 DIAGNOSIS — I129 Hypertensive chronic kidney disease with stage 1 through stage 4 chronic kidney disease, or unspecified chronic kidney disease: Secondary | ICD-10-CM | POA: Diagnosis not present

## 2021-07-07 NOTE — Progress Notes (Signed)
[name], [name] ([id number]) Visit Report for 07/06/2021 Chief Complaint Document Details Patient Name: Date of Service: [name], [name] 07/06/2021 1:30 PM Medical Record Number: [id number] Patient Account Number: [id number] Date of Birth/Sex: Treating RN: [birth year] (60 y.o. [name] Primary Care Provider: [name] Other Clinician: Referring Provider: Treating Provider/Extender: [name]: 2 Information Obtained from: Patient Chief Complaint Sacral ulcer Electronic Signature(s) Signed: 07/06/2021 3:23:53 PM By: [name] DO Entered By: [name] on 07/06/2021 15:20:04 -------------------------------------------------------------------------------- HPI Details Patient Name: Date of Service: [name], [name] 07/06/2021 1:30 PM Medical Record Number: [id number] Patient Account Number: [id number] Date of Birth/Sex: Treating RN: 1961-10-29 (60 y.o. [name] Primary Care Provider: [name] Other Clinician: Referring Provider: Treating Provider/Extender: [name]: 2 History of Present Illness HPI Description: Admission 6/24 [name] is a 60 year old female with a past medical history of type 1 diabetes, right BKA, and kidney transplant that presents to the clinic for an 59-monthhistory of sacral ulcer. She has been using wet-to-dry dressings up until 5 weeks ago when she was started on a wound VAC. 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 She has completed 6 weeks of treatment and states she has been discharged by infectious disease. She currently denies any issues to the wound area. She reports minimal pain. She denies signs of infection. 7/11; patient presents for 2-week follow-up. She uses a wound VAC that is changed with home health 3 times a week. She denies any signs of infection. She has  no complaints or issues today. Electronic Signature(s) Signed: 07/06/2021 3:23:53 PM By: HKalman ShanDO Entered By: HKalman Shanon 07/06/2021 15:20:48 -------------------------------------------------------------------------------- Physical Exam Details Patient Name: Date of Service: [name], HOCHMAN7/10/2021 1:30 PM Medical Record Number: [id number] Account Number: [id number] of Birth/Sex: Treating RN: 101/14/1962(60y.o. FSue LushPrimary Care Provider: Other Clinician: MMelissa MontaneReferring Provider: Treating Provider/Extender: HHoyt KochWeeks in Treatment: 2 Constitutional respirations regular, non-labored and within target range for patient..Marland KitchenPsychiatric pleasant and cooperative. Notes Sacral region: Large open wound with healthy granulation tissue present and undermining noted. No obvious signs of infection Electronic Signature(s) Signed: 07/06/2021 3:23:53 PM By: HKalman ShanDO Entered By: HKalman Shanon 07/06/2021 15:22:12 -------------------------------------------------------------------------------- Physician Orders Details Patient Name: Date of Service: MKATIA, SHANKAR7/10/2021 1:30 PM Medical Record Number: [id number] Account Number: [id number] of Birth/Sex: Treating RN: 111/19/1962(60y.o. FSue LushPrimary Care Provider: MMelissa MontaneOther Clinician: Referring Provider: Treating Provider/Extender: HFelicie Mornin Treatment: 2 Verbal / Phone Orders: No Diagnosis Coding ICD-10 Coding Code Description L89.153 Pressure ulcer of sacral region, stage 3 E10.9 Type 1 diabetes mellitus without complications Z0000000Acquired absence of right leg below knee Z94.0 Kidney transplant status Follow-up Appointments Return appointment in 3 weeks. - Dr. HHeber CarolinaBathing/ Shower/ Hygiene Do not shower or bathe in tub. Negative Presssure Wound  Therapy Wound Vac to wound continuously at 1286mhg pressure Black and White Foam combination - White foam packed into undermining and black foam to rest of wound bed. Lay drape and bridge with black foam to pt.'s side. Off-Loading Turn and reposition every 2 hours Additional Orders / Instructions Follow Nutritious Diet Home Health No change in wound care orders this week; continue Home Health for wound care. May utilize formulary equivalent dressing for wound treatment orders unless otherwise specified. - Home Health is  [facility]- to see pt. Monday, Wednesday, Friday for wound vac changes. Wound Treatment Wound #1 - Sacrum Cleanser: Wound Cleanser (Home Health) 3 x Per Week/15 Days Discharge Instructions: Cleanse the wound with wound cleanser prior to applying a clean dressing using gauze sponges, not tissue or cotton balls. Prim Dressing: Wound vac 3 x Per Week/15 Days ary Discharge Instructions: White foam to be packed in undermining and black foam to rest of wound bed. Bridge with black foam to pt.'s side Prim Dressing: Wet-to-dry 3 x Per Week/15 Days ary Discharge Instructions: Pack wet-to-dry dressing in clinic today Electronic Signature(s) Signed: 07/06/2021 5:19:06 PM By: [name] Signed: 07/07/2021 10:31:22 AM By: [name] DO Previous Signature: 07/06/2021 3:23:53 PM Version By: [name] DO Entered By: [name] on 07/06/2021 15:54:37 -------------------------------------------------------------------------------- Problem List Details Patient Name: Date of Service: [name], [name] 07/06/2021 1:30 PM Medical Record Number: [id number] Patient Account Number: [id number] Date of Birth/Sex: Treating RN: [birth year] (60 y.o. [name] Primary Care Provider: [name] Other Clinician: Referring Provider: Treating Provider/Extender: [name]: 2 Active Problems ICD-10 Encounter Code  Description Active Date MDM Diagnosis L89.153 Pressure ulcer of sacral region, stage 3 06/19/2021 No Yes E10.9 Type 1 diabetes mellitus without complications [id number] No Yes Z89.511 Acquired absence of right leg below knee 06/19/2021 No Yes Z94.0 Kidney transplant status 06/19/2021 No Yes Inactive Problems Resolved Problems Electronic Signature(s) Signed: 07/06/2021 3:23:53 PM By: [name] DO Previous Signature: 07/06/2021 2:04:23 PM Version By: [name] Entered By: [name] on 07/06/2021 15:19:51 -------------------------------------------------------------------------------- Progress Note Details Patient Name: Date of Service: [name], [name] 07/06/2021 1:30 PM Medical Record Number: [id number] Patient Account Number: [id number] Date of Birth/Sex: Treating RN: 1961/12/09 (60 y.o. [name] Primary Care Provider: [name] Other Clinician: Referring Provider: Treating Provider/Extender: [name]: 2 Subjective Chief Complaint Information obtained from Patient Sacral ulcer History of Present Illness (HPI) Admission 6/24 Ms. Chairty Rumberger is a 60 year old female with a past medical history of type 1 diabetes, right BKA, and kidney transplant that presents to the clinic for an 87-monthhistory of sacral ulcer. She has been using wet-to-dry dressings up until 5 weeks ago when she was started on a wound VAC. 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 She has completed 6 weeks of treatment and states she has been discharged by infectious disease. She currently denies any issues to the wound area. She reports minimal pain. She denies signs of infection. 7/11; patient presents for 2-week follow-up. She uses a wound VAC that is changed with home health 3 times a week. She denies any signs of infection. She has no complaints or issues today. Patient  History Information obtained from Patient. Family History Cancer - Father, Diabetes - Father, Heart Disease - Maternal Grandparents, Hypertension - Mother, Thyroid Problems - Mother,Siblings, No family history of Hereditary Spherocytosis, Kidney Disease, Lung Disease, Seizures, Stroke, Tuberculosis. Social History Former smoker - from teens to 36's Marital Status - Married, Alcohol Use - Rarely - beer, Drug Use - No History, Caffeine Use - Daily - coffee. Medical History Hematologic/Lymphatic Patient has history of Anemia Endocrine Patient has history of Type I Diabetes - s Genitourinary Patient has history of End Stage Renal Disease - off dialysis since transplants Immunological Denies history of Lupus Erythematosus, Raynaudoos, Scleroderma Integumentary (Skin) Denies history of History of Burn Musculoskeletal Patient has history of Osteoarthritis, Osteomyelitis Neurologic Patient has history  of Neuropathy Oncologic Patient has history of Received Radiation Denies history of Received Chemotherapy Hospitalization/Surgery History - infected wound (pressure) 2022. - (R) BKA amp Oct 2021. Objective Constitutional respirations regular, non-labored and within target range for patient.. Vitals Time Taken: 2:00 PM, Height: 53 in, Weight: 110 lbs, BMI: 27.5, Temperature: 98.4 F, Pulse: 90 bpm, Respiratory Rate: 18 breaths/min, Blood Pressure: 113/60 mmHg, Capillary Blood Glucose: 127 mg/dl. Psychiatric pleasant and cooperative. General Notes: Sacral region: Large open wound with healthy granulation tissue present and undermining noted. No obvious signs of infection Integumentary (Hair, Skin) Wound #1 status is Open. Original cause of wound was Pressure Injury. The date acquired was: 02/24/2021. The wound has been in treatment 2 weeks. The wound is located on the Sacrum. The wound measures 7cm length x 4.5cm width x 1.1cm depth; 24.74cm^2 area and 27.214cm^3 volume. There is Fat  Layer (Subcutaneous Tissue) exposed. There is no tunneling noted, however, there is undermining starting at 8:00 and ending at 4:00 with a maximum distance of 2.6cm. There is a medium amount of serosanguineous drainage noted. The wound margin is well defined and not attached to the wound base. There is large (67- 100%) pink granulation within the wound bed. There is no necrotic tissue within the wound bed. Assessment Active Problems ICD-10 Pressure ulcer of sacral region, stage 3 Type 1 diabetes mellitus without complications Acquired absence of right leg below knee Kidney transplant status Patient's wound has shown some improvement in size. She is using a wound VAC and I recommended continuing this. This will be changed with home health. No signs of infection on exam. I will see her back in 3 weeks. She can call if there are any questions or concerns and be seen sooner Plan Follow-up Appointments: Return Appointment in 2 weeks. - Dr. Heber [place] Bathing/ Shower/ Hygiene: Do not shower or bathe in tub. Negative Presssure Wound Therapy: Wound Vac to wound continuously at 176m/hg pressure Black and White Foam combination - White foam packed into undermining and black foam to rest of wound bed. Lay drape and bridge with black foam to pt.'s side. Off-Loading: Turn and reposition every 2 hours Additional Orders / Instructions: Follow Nutritious Diet Home Health: No change in wound care orders this week; continue Home Health for wound care. May utilize formulary equivalent dressing for wound treatment orders unless otherwise specified. [facility] Friday [facility] wound vac changes. WOUND #1: - Sacrum Wound Laterality: Cleanser: Wound Cleanser (Home Health) 3 x Per Week/15 Days Discharge Instructions: Cleanse the wound with wound cleanser prior to applying a clean dressing using gauze sponges, not tissue or cotton balls. Prim Dressing: Wound vac 3 x Per  Week/15 Days ary Discharge Instructions: White foam to be packed in undermining and black foam to rest of wound bed. Bridge with black foam to pt.'s side Prim Dressing: Wet-to-dry 3 x Per Week/15 Days ary Discharge Instructions: Pack wet-to-dry dressing in clinic today 1. Continue wound VAC 2. Follow-up in 3 weeks Electronic Signature(s) Signed: 07/06/2021 3:23:53 PM By: HKalman ShanDO Entered By: HKalman Shanon 07/06/2021 15:23:11 -------------------------------------------------------------------------------- HxROS Details Patient Name: Date of Service: [name], MIEDEMA7/10/2021 1:30 PM Medical Record Number: [id number] Account Number: [id number] of Birth/Sex: Treating RN: [birth year](60y.o. FSue LushPrimary Care Provider: MMelissa MontaneOther Clinician: Referring Provider: Treating Provider/Extender: HFelicie Mornin Treatment: 2 Information Obtained From Patient Hematologic/Lymphatic Medical History: Positive for: Anemia Endocrine Medical History: Positive for:  Type I Diabetes - s Time with diabetes: 24 years Treated with: Insulin Blood sugar tested every day: Yes Tested : constant with device Genitourinary Medical History: Positive for: End Stage Renal Disease - off dialysis since transplants Immunological Medical History: Negative for: Lupus Erythematosus; Raynauds; Scleroderma Integumentary (Skin) Medical History: Negative for: History of Burn Musculoskeletal Medical History: Positive for: Osteoarthritis; Osteomyelitis Neurologic Medical History: Positive for: Neuropathy Oncologic Medical History: Positive for: Received Radiation Negative for: Received Chemotherapy Immunizations Pneumococcal Vaccine: Received Pneumococcal Vaccination: Yes Immunization Notes: states being up to date on all - unknown dates for immuization Implantable Devices None Hospitalization / Surgery History Type of  Hospitalization/Surgery infected wound (pressure) 2022 (R) BKA amp Oct 2021 Family and Social History Cancer: Yes - Father; Diabetes: Yes - Father; Heart Disease: Yes - Maternal Grandparents; Hereditary Spherocytosis: No; Hypertension: Yes - Mother; Kidney Disease: No; Lung Disease: No; Seizures: No; Stroke: No; Thyroid Problems: Yes - Mother,Siblings; Tuberculosis: No; Former smoker - from teens to 22's; Marital Status - Married; Alcohol Use: Rarely - beer; Drug Use: No History; Caffeine Use: Daily - coffee; Financial Concerns: No; Food, Clothing or Shelter Needs: No; Support System Lacking: No; Transportation Concerns: No Electronic Signature(s) Signed: 07/06/2021 3:23:53 PM By: [name] DO Signed: 07/06/2021 5:19:06 PM By: [name] Entered By: [name] on 07/06/2021 15:21:15 -------------------------------------------------------------------------------- SuperBill Details Patient Name: Date of Service: [name], [name] 07/06/2021 Medical Record Number: [id number] Patient Account Number: [id number] Date of Birth/Sex: Treating RN: 05-Nov-1961 (59 y.o. [name] Primary Care Provider: [name] Other Clinician: Referring Provider: Treating Provider/Extender: [name]: 2 Diagnosis Coding ICD-10 Codes Code Description [phone] Pressure ulcer of sacral region, stage 3 E10.9 Type 1 diabetes mellitus without complications 0000000 Acquired absence of right leg below knee Z94.0 Kidney transplant status Facility Procedures CPT4 Code: [id number] Description: 99213 - WOUND CARE VISIT-LEV 3 EST PT Modifier: Quantity: 1 Physician Procedures : CPT4 Code Description Modifier [id number] - WC PHYS LEVEL 3 - EST PT ICD-10 Diagnosis Description L89.153 Pressure ulcer of sacral region, stage 3 E10.9 Type 1 diabetes mellitus without complications 0000000 Acquired absence of right leg below knee  Z94.0 Kidney transplant  status Quantity: 1 Electronic Signature(s) Signed: 07/06/2021 3:23:53 PM By: [name] DO Entered By: [name] on 07/06/2021 15:23:26

## 2021-07-08 DIAGNOSIS — D631 Anemia in chronic kidney disease: Secondary | ICD-10-CM | POA: Diagnosis not present

## 2021-07-08 DIAGNOSIS — N1831 Chronic kidney disease, stage 3a: Secondary | ICD-10-CM | POA: Diagnosis not present

## 2021-07-08 DIAGNOSIS — K219 Gastro-esophageal reflux disease without esophagitis: Secondary | ICD-10-CM | POA: Diagnosis not present

## 2021-07-08 DIAGNOSIS — E1069 Type 1 diabetes mellitus with other specified complication: Secondary | ICD-10-CM | POA: Diagnosis not present

## 2021-07-08 DIAGNOSIS — Z435 Encounter for attention to cystostomy: Secondary | ICD-10-CM | POA: Diagnosis not present

## 2021-07-08 DIAGNOSIS — Z7952 Long term (current) use of systemic steroids: Secondary | ICD-10-CM | POA: Diagnosis not present

## 2021-07-08 DIAGNOSIS — Z7982 Long term (current) use of aspirin: Secondary | ICD-10-CM | POA: Diagnosis not present

## 2021-07-08 DIAGNOSIS — E1022 Type 1 diabetes mellitus with diabetic chronic kidney disease: Secondary | ICD-10-CM | POA: Diagnosis not present

## 2021-07-08 DIAGNOSIS — C519 Malignant neoplasm of vulva, unspecified: Secondary | ICD-10-CM | POA: Diagnosis not present

## 2021-07-08 DIAGNOSIS — F321 Major depressive disorder, single episode, moderate: Secondary | ICD-10-CM | POA: Diagnosis not present

## 2021-07-08 DIAGNOSIS — I129 Hypertensive chronic kidney disease with stage 1 through stage 4 chronic kidney disease, or unspecified chronic kidney disease: Secondary | ICD-10-CM | POA: Diagnosis not present

## 2021-07-08 DIAGNOSIS — Z79891 Long term (current) use of opiate analgesic: Secondary | ICD-10-CM | POA: Diagnosis not present

## 2021-07-08 DIAGNOSIS — R339 Retention of urine, unspecified: Secondary | ICD-10-CM | POA: Diagnosis not present

## 2021-07-08 DIAGNOSIS — E785 Hyperlipidemia, unspecified: Secondary | ICD-10-CM | POA: Diagnosis not present

## 2021-07-08 DIAGNOSIS — L89154 Pressure ulcer of sacral region, stage 4: Secondary | ICD-10-CM | POA: Diagnosis not present

## 2021-07-08 DIAGNOSIS — Z993 Dependence on wheelchair: Secondary | ICD-10-CM | POA: Diagnosis not present

## 2021-07-09 DIAGNOSIS — L89154 Pressure ulcer of sacral region, stage 4: Secondary | ICD-10-CM | POA: Diagnosis not present

## 2021-07-10 DIAGNOSIS — L89154 Pressure ulcer of sacral region, stage 4: Secondary | ICD-10-CM | POA: Diagnosis not present

## 2021-07-10 DIAGNOSIS — E1069 Type 1 diabetes mellitus with other specified complication: Secondary | ICD-10-CM | POA: Diagnosis not present

## 2021-07-10 DIAGNOSIS — E785 Hyperlipidemia, unspecified: Secondary | ICD-10-CM | POA: Diagnosis not present

## 2021-07-11 DIAGNOSIS — K219 Gastro-esophageal reflux disease without esophagitis: Secondary | ICD-10-CM | POA: Diagnosis not present

## 2021-07-11 DIAGNOSIS — E1022 Type 1 diabetes mellitus with diabetic chronic kidney disease: Secondary | ICD-10-CM | POA: Diagnosis not present

## 2021-07-11 DIAGNOSIS — Z7952 Long term (current) use of systemic steroids: Secondary | ICD-10-CM | POA: Diagnosis not present

## 2021-07-11 DIAGNOSIS — Z993 Dependence on wheelchair: Secondary | ICD-10-CM | POA: Diagnosis not present

## 2021-07-11 DIAGNOSIS — R339 Retention of urine, unspecified: Secondary | ICD-10-CM | POA: Diagnosis not present

## 2021-07-11 DIAGNOSIS — Z435 Encounter for attention to cystostomy: Secondary | ICD-10-CM | POA: Diagnosis not present

## 2021-07-11 DIAGNOSIS — Z7982 Long term (current) use of aspirin: Secondary | ICD-10-CM | POA: Diagnosis not present

## 2021-07-11 DIAGNOSIS — I129 Hypertensive chronic kidney disease with stage 1 through stage 4 chronic kidney disease, or unspecified chronic kidney disease: Secondary | ICD-10-CM | POA: Diagnosis not present

## 2021-07-11 DIAGNOSIS — L89154 Pressure ulcer of sacral region, stage 4: Secondary | ICD-10-CM | POA: Diagnosis not present

## 2021-07-11 DIAGNOSIS — E1069 Type 1 diabetes mellitus with other specified complication: Secondary | ICD-10-CM | POA: Diagnosis not present

## 2021-07-11 DIAGNOSIS — N1831 Chronic kidney disease, stage 3a: Secondary | ICD-10-CM | POA: Diagnosis not present

## 2021-07-11 DIAGNOSIS — C519 Malignant neoplasm of vulva, unspecified: Secondary | ICD-10-CM | POA: Diagnosis not present

## 2021-07-11 DIAGNOSIS — D631 Anemia in chronic kidney disease: Secondary | ICD-10-CM | POA: Diagnosis not present

## 2021-07-11 DIAGNOSIS — Z79891 Long term (current) use of opiate analgesic: Secondary | ICD-10-CM | POA: Diagnosis not present

## 2021-07-11 DIAGNOSIS — F321 Major depressive disorder, single episode, moderate: Secondary | ICD-10-CM | POA: Diagnosis not present

## 2021-07-11 DIAGNOSIS — E785 Hyperlipidemia, unspecified: Secondary | ICD-10-CM | POA: Diagnosis not present

## 2021-07-12 DIAGNOSIS — L89154 Pressure ulcer of sacral region, stage 4: Secondary | ICD-10-CM | POA: Diagnosis not present

## 2021-07-13 DIAGNOSIS — D631 Anemia in chronic kidney disease: Secondary | ICD-10-CM | POA: Diagnosis not present

## 2021-07-13 DIAGNOSIS — Z993 Dependence on wheelchair: Secondary | ICD-10-CM | POA: Diagnosis not present

## 2021-07-13 DIAGNOSIS — E1069 Type 1 diabetes mellitus with other specified complication: Secondary | ICD-10-CM | POA: Diagnosis not present

## 2021-07-13 DIAGNOSIS — C519 Malignant neoplasm of vulva, unspecified: Secondary | ICD-10-CM | POA: Diagnosis not present

## 2021-07-13 DIAGNOSIS — I129 Hypertensive chronic kidney disease with stage 1 through stage 4 chronic kidney disease, or unspecified chronic kidney disease: Secondary | ICD-10-CM | POA: Diagnosis not present

## 2021-07-13 DIAGNOSIS — R339 Retention of urine, unspecified: Secondary | ICD-10-CM | POA: Diagnosis not present

## 2021-07-13 DIAGNOSIS — E1022 Type 1 diabetes mellitus with diabetic chronic kidney disease: Secondary | ICD-10-CM | POA: Diagnosis not present

## 2021-07-13 DIAGNOSIS — L89154 Pressure ulcer of sacral region, stage 4: Secondary | ICD-10-CM | POA: Diagnosis not present

## 2021-07-13 DIAGNOSIS — F321 Major depressive disorder, single episode, moderate: Secondary | ICD-10-CM | POA: Diagnosis not present

## 2021-07-13 DIAGNOSIS — Z7982 Long term (current) use of aspirin: Secondary | ICD-10-CM | POA: Diagnosis not present

## 2021-07-13 DIAGNOSIS — Z79891 Long term (current) use of opiate analgesic: Secondary | ICD-10-CM | POA: Diagnosis not present

## 2021-07-13 DIAGNOSIS — N1831 Chronic kidney disease, stage 3a: Secondary | ICD-10-CM | POA: Diagnosis not present

## 2021-07-13 DIAGNOSIS — E785 Hyperlipidemia, unspecified: Secondary | ICD-10-CM | POA: Diagnosis not present

## 2021-07-13 DIAGNOSIS — Z435 Encounter for attention to cystostomy: Secondary | ICD-10-CM | POA: Diagnosis not present

## 2021-07-13 DIAGNOSIS — K219 Gastro-esophageal reflux disease without esophagitis: Secondary | ICD-10-CM | POA: Diagnosis not present

## 2021-07-13 DIAGNOSIS — Z7952 Long term (current) use of systemic steroids: Secondary | ICD-10-CM | POA: Diagnosis not present

## 2021-07-14 DIAGNOSIS — L89154 Pressure ulcer of sacral region, stage 4: Secondary | ICD-10-CM | POA: Diagnosis not present

## 2021-07-15 DIAGNOSIS — K219 Gastro-esophageal reflux disease without esophagitis: Secondary | ICD-10-CM | POA: Diagnosis not present

## 2021-07-15 DIAGNOSIS — I129 Hypertensive chronic kidney disease with stage 1 through stage 4 chronic kidney disease, or unspecified chronic kidney disease: Secondary | ICD-10-CM | POA: Diagnosis not present

## 2021-07-15 DIAGNOSIS — E1022 Type 1 diabetes mellitus with diabetic chronic kidney disease: Secondary | ICD-10-CM | POA: Diagnosis not present

## 2021-07-15 DIAGNOSIS — Z79891 Long term (current) use of opiate analgesic: Secondary | ICD-10-CM | POA: Diagnosis not present

## 2021-07-15 DIAGNOSIS — Z435 Encounter for attention to cystostomy: Secondary | ICD-10-CM | POA: Diagnosis not present

## 2021-07-15 DIAGNOSIS — Z7952 Long term (current) use of systemic steroids: Secondary | ICD-10-CM | POA: Diagnosis not present

## 2021-07-15 DIAGNOSIS — N1831 Chronic kidney disease, stage 3a: Secondary | ICD-10-CM | POA: Diagnosis not present

## 2021-07-15 DIAGNOSIS — R339 Retention of urine, unspecified: Secondary | ICD-10-CM | POA: Diagnosis not present

## 2021-07-15 DIAGNOSIS — E785 Hyperlipidemia, unspecified: Secondary | ICD-10-CM | POA: Diagnosis not present

## 2021-07-15 DIAGNOSIS — E1069 Type 1 diabetes mellitus with other specified complication: Secondary | ICD-10-CM | POA: Diagnosis not present

## 2021-07-15 DIAGNOSIS — Z993 Dependence on wheelchair: Secondary | ICD-10-CM | POA: Diagnosis not present

## 2021-07-15 DIAGNOSIS — L89154 Pressure ulcer of sacral region, stage 4: Secondary | ICD-10-CM | POA: Diagnosis not present

## 2021-07-15 DIAGNOSIS — Z7982 Long term (current) use of aspirin: Secondary | ICD-10-CM | POA: Diagnosis not present

## 2021-07-15 DIAGNOSIS — C519 Malignant neoplasm of vulva, unspecified: Secondary | ICD-10-CM | POA: Diagnosis not present

## 2021-07-15 DIAGNOSIS — F321 Major depressive disorder, single episode, moderate: Secondary | ICD-10-CM | POA: Diagnosis not present

## 2021-07-15 DIAGNOSIS — D631 Anemia in chronic kidney disease: Secondary | ICD-10-CM | POA: Diagnosis not present

## 2021-07-16 DIAGNOSIS — F321 Major depressive disorder, single episode, moderate: Secondary | ICD-10-CM | POA: Diagnosis not present

## 2021-07-16 DIAGNOSIS — E785 Hyperlipidemia, unspecified: Secondary | ICD-10-CM | POA: Diagnosis not present

## 2021-07-16 DIAGNOSIS — Z7952 Long term (current) use of systemic steroids: Secondary | ICD-10-CM | POA: Diagnosis not present

## 2021-07-16 DIAGNOSIS — E1069 Type 1 diabetes mellitus with other specified complication: Secondary | ICD-10-CM | POA: Diagnosis not present

## 2021-07-16 DIAGNOSIS — Z79891 Long term (current) use of opiate analgesic: Secondary | ICD-10-CM | POA: Diagnosis not present

## 2021-07-16 DIAGNOSIS — K219 Gastro-esophageal reflux disease without esophagitis: Secondary | ICD-10-CM | POA: Diagnosis not present

## 2021-07-16 DIAGNOSIS — D631 Anemia in chronic kidney disease: Secondary | ICD-10-CM | POA: Diagnosis not present

## 2021-07-16 DIAGNOSIS — L89154 Pressure ulcer of sacral region, stage 4: Secondary | ICD-10-CM | POA: Diagnosis not present

## 2021-07-16 DIAGNOSIS — Z993 Dependence on wheelchair: Secondary | ICD-10-CM | POA: Diagnosis not present

## 2021-07-16 DIAGNOSIS — Z435 Encounter for attention to cystostomy: Secondary | ICD-10-CM | POA: Diagnosis not present

## 2021-07-16 DIAGNOSIS — Z7982 Long term (current) use of aspirin: Secondary | ICD-10-CM | POA: Diagnosis not present

## 2021-07-16 DIAGNOSIS — I129 Hypertensive chronic kidney disease with stage 1 through stage 4 chronic kidney disease, or unspecified chronic kidney disease: Secondary | ICD-10-CM | POA: Diagnosis not present

## 2021-07-16 DIAGNOSIS — E1022 Type 1 diabetes mellitus with diabetic chronic kidney disease: Secondary | ICD-10-CM | POA: Diagnosis not present

## 2021-07-16 DIAGNOSIS — R339 Retention of urine, unspecified: Secondary | ICD-10-CM | POA: Diagnosis not present

## 2021-07-16 DIAGNOSIS — E1021 Type 1 diabetes mellitus with diabetic nephropathy: Secondary | ICD-10-CM | POA: Diagnosis not present

## 2021-07-16 DIAGNOSIS — N1831 Chronic kidney disease, stage 3a: Secondary | ICD-10-CM | POA: Diagnosis not present

## 2021-07-16 DIAGNOSIS — C519 Malignant neoplasm of vulva, unspecified: Secondary | ICD-10-CM | POA: Diagnosis not present

## 2021-07-16 NOTE — Progress Notes (Signed)
Doris, Lopez ([id number]) Visit Report for 06/19/2021 Abuse/Suicide Risk Screen Details Patient Name: Date of Service: [name], [name] 06/19/2021 9:00 [place] Record Number: [id number] Patient Account Number: [id number] Date of Birth/Sex: Treating RN: [birth year] (60 y.o. Helene Shoe, Tammi Klippel Primary Care Javonne Louissaint: [name] Other Clinician: Referring Issa Luster: Treating Vikki Gains/Extender: [name]: 0 Abuse/Suicide Risk Screen Items Answer ABUSE RISK SCREEN: Has anyone close to you tried to hurt or harm you recentlyo No Do you feel uncomfortable with anyone in your familyo No Has anyone forced you do things that you didnt want to doo No Electronic Signature(s) Signed: 07/15/2021 8:08:10 PM By: [name] Previous Signature: 07/13/2021 9:13:38 PM Version By: [name] Entered By: [name] on 07/15/2021 20:06:56 -------------------------------------------------------------------------------- Activities of Daily Living Details Patient Name: Date of Service: Doris, Lopez 06/19/2021 9:00 [place] Record Number: [id number] Patient Account Number: [id number] Date of Birth/Sex: Treating RN: April 07, 1961 (60 y.o. Helene Shoe, Tammi Klippel Primary Care Azad Calame: [name] Other Clinician: Referring Lukas Pelcher: Treating Ishmel Acevedo/Extender: [name]: 0 Activities of Daily Living Items Answer Activities of Daily Living (Please select one for each item) Drive Automobile Not Able T Medications ake Need Assistance Use T elephone Need Assistance Care for Appearance Need Assistance Use T oilet Need Assistance Bath / Shower Need Assistance Dress Self Need Assistance Feed Self Need Assistance Walk Not Able Get In / Out Bed Not Able Housework Not Able Prepare Meals Need Assistance Handle Money Not Able Shop for Self Need Assistance Electronic Signature(s) Signed: 07/15/2021 8:08:10  PM By: [name] Previous Signature: 07/13/2021 9:13:38 PM Version By: [name] Entered By: [name] on 07/15/2021 20:07:03 -------------------------------------------------------------------------------- Education Screening Details Patient Name: Date of Service: Doris, Lopez 06/19/2021 9:00 [place] Record Number: [id number] Patient Account Number: [id number] Date of Birth/Sex: Treating RN: [birth year] (60 y.o. Helene Shoe, Tammi Klippel Primary Care Ryott Rafferty: [name] Other Clinician: Referring Casha Estupinan: Treating Alexcia Schools/Extender: [name] in Treatment: 0 Primary Learner Assessed: Caregiver spouse Reason Patient is not Primary Learner: location of wound Learning Preferences/Education Level/Primary Language Learning Preference: Explanation, Printed Material Highest Education Level: College or Above Preferred Language: English Cognitive Barrier Language Barrier: No Translator Needed: No Memory Deficit: No Emotional Barrier: No Cultural/Religious Beliefs Affecting Medical Care: No Physical Barrier Impaired Vision: Yes Glasses Impaired Hearing: No Decreased Hand dexterity: No Knowledge/Comprehension Knowledge Level: Medium Comprehension Level: Medium Ability to understand written instructions: Medium Ability to understand verbal instructions: Medium Motivation Anxiety Level: Calm Cooperation: Cooperative Education Importance: Acknowledges Need Interest in Health Problems: Asks Questions Perception: Coherent Willingness to Engage in Self-Management High Activities: Readiness to Engage in Self-Management High Activities: Electronic Signature(s) Signed: 07/15/2021 8:08:10 PM By: [name] Previous Signature: 07/13/2021 9:13:38 PM Version By: [name] Entered By: [name] on 07/15/2021 20:07:08 -------------------------------------------------------------------------------- Fall Risk Assessment  Details Patient Name: Date of Service: Doris, Lopez 06/19/2021 9:00 [place] Record Number: [id number] Patient Account Number: [id number] Date of Birth/Sex: Treating RN: May 13, 1961 (60 y.o. Helene Shoe, Meta.Reding Primary Care Birney Belshe: [name] Other Clinician: Referring Quinlan Mcfall: Treating Dabid Godown/Extender: [name]: 0 Fall Risk Assessment Items Have you had 2 or more falls in the last 12 monthso 0 No Have you had any fall that resulted in injury in the last 12 monthso 0 No FALLS RISK SCREEN History of falling - immediate or within 3 months 0 No Secondary diagnosis (Do you have 2 or more medical diagnoseso)  0 No Ambulatory aid None/bed rest/wheelchair/nurse 0 Yes Crutches/cane/walker 0 No Furniture 0 No Intravenous therapy Access/Saline/Heparin Lock 0 No Gait/Transferring Normal/ bed rest/ wheelchair 0 Yes Weak (short steps with or without shuffle, stooped but able to lift head while walking, may seek 0 No support from furniture) Impaired (short steps with shuffle, may have difficulty arising from chair, head down, impaired 0 No balance) Mental Status Oriented to own ability 0 Yes Electronic Signature(s) Signed: 07/15/2021 8:08:10 PM By: [name] Previous Signature: 07/13/2021 9:13:38 PM Version By: [name] Entered By: [name] on 07/15/2021 20:07:14 -------------------------------------------------------------------------------- Foot Assessment Details Patient Name: Date of Service: Doris, Lopez 06/19/2021 9:00 [place] Record Number: [id number] Patient Account Number: [id number] Date of Birth/Sex: Treating RN: 01-18-1961 (60 y.o. [name] Primary Care Cesilia Shinn: [name] Other Clinician: Referring Marcellina Jonsson: Treating Cyra Spader/Extender: [name]: 0 Foot Assessment Items Site Locations + = Sensation present, - = Sensation absent, C =  Callus, U = Ulcer R = Redness, W = Warmth, M = Maceration, PU = Pre-ulcerative lesion F = Fissure, S = Swelling, D = Dryness Assessment Right: Left: Other Deformity: No No Prior Foot Ulcer: No No Prior Amputation: No No Charcot Joint: No No Ambulatory Status: Gait: Electronic Signature(s) Signed: 07/15/2021 8:08:10 PM By: [name] Previous Signature: 07/13/2021 9:13:38 PM Version By: [name] Entered By: [name] on 07/15/2021 20:07:26 -------------------------------------------------------------------------------- Nutrition Risk Screening Details Patient Name: Date of Service: AUBREA, DELUCCIA 06/19/2021 9:00 [place] Record Number: [id number] Patient Account Number: [id number] Date of Birth/Sex: Treating RN: 1961-03-13 (60 y.o. Helene Shoe, Meta.Reding Primary Care Lord Lancour: [name] Other Clinician: Referring Sharmane Dame: Treating Macio Kissoon/Extender: [name]: 0 Height (in): 53 Weight (lbs): 110 Body Mass Index (BMI): 27.5 Nutrition Risk Screening Items Score Screening NUTRITION RISK SCREEN: I have an illness or condition that made me change the kind and/or amount of food I eat 2 Yes I eat fewer than two meals per day 0 No I eat few fruits and vegetables, or milk products 0 No I have three or more drinks of beer, liquor or wine almost every day 0 No I have tooth or mouth problems that make it hard for me to eat 2 Yes I don't always have enough money to buy the food I need 0 No I eat alone most of the time 0 No I take three or more different prescribed or over-the-counter drugs a day 1 Yes Without wanting to, I have lost or gained 10 pounds in the last six months 0 No I am not always physically able to shop, cook and/or feed myself 2 Yes Nutrition Protocols Good Risk Protocol Moderate Risk Protocol High Risk Proctocol 0 Provide education on nutrition Risk Level: High Risk Score: 7 Electronic  Signature(s) Signed: 07/15/2021 8:08:10 PM By: [name] Previous Signature: 07/13/2021 9:13:38 PM Version By: [name] Entered By: [name] on 07/15/2021 20:07:19

## 2021-07-17 DIAGNOSIS — Z993 Dependence on wheelchair: Secondary | ICD-10-CM | POA: Diagnosis not present

## 2021-07-17 DIAGNOSIS — D631 Anemia in chronic kidney disease: Secondary | ICD-10-CM | POA: Diagnosis not present

## 2021-07-17 DIAGNOSIS — R339 Retention of urine, unspecified: Secondary | ICD-10-CM | POA: Diagnosis not present

## 2021-07-17 DIAGNOSIS — N1831 Chronic kidney disease, stage 3a: Secondary | ICD-10-CM | POA: Diagnosis not present

## 2021-07-17 DIAGNOSIS — E1069 Type 1 diabetes mellitus with other specified complication: Secondary | ICD-10-CM | POA: Diagnosis not present

## 2021-07-17 DIAGNOSIS — C519 Malignant neoplasm of vulva, unspecified: Secondary | ICD-10-CM | POA: Diagnosis not present

## 2021-07-17 DIAGNOSIS — Z7952 Long term (current) use of systemic steroids: Secondary | ICD-10-CM | POA: Diagnosis not present

## 2021-07-17 DIAGNOSIS — E785 Hyperlipidemia, unspecified: Secondary | ICD-10-CM | POA: Diagnosis not present

## 2021-07-17 DIAGNOSIS — I129 Hypertensive chronic kidney disease with stage 1 through stage 4 chronic kidney disease, or unspecified chronic kidney disease: Secondary | ICD-10-CM | POA: Diagnosis not present

## 2021-07-17 DIAGNOSIS — Z435 Encounter for attention to cystostomy: Secondary | ICD-10-CM | POA: Diagnosis not present

## 2021-07-17 DIAGNOSIS — E1022 Type 1 diabetes mellitus with diabetic chronic kidney disease: Secondary | ICD-10-CM | POA: Diagnosis not present

## 2021-07-17 DIAGNOSIS — L89154 Pressure ulcer of sacral region, stage 4: Secondary | ICD-10-CM | POA: Diagnosis not present

## 2021-07-17 DIAGNOSIS — F321 Major depressive disorder, single episode, moderate: Secondary | ICD-10-CM | POA: Diagnosis not present

## 2021-07-17 DIAGNOSIS — K219 Gastro-esophageal reflux disease without esophagitis: Secondary | ICD-10-CM | POA: Diagnosis not present

## 2021-07-17 DIAGNOSIS — Z79891 Long term (current) use of opiate analgesic: Secondary | ICD-10-CM | POA: Diagnosis not present

## 2021-07-17 DIAGNOSIS — Z7982 Long term (current) use of aspirin: Secondary | ICD-10-CM | POA: Diagnosis not present

## 2021-07-17 NOTE — Progress Notes (Signed)
[name], [name] ([id number]) Visit Report for 06/19/2021 Chief Complaint Document Details Patient Name: Date of Service: [name], [name] 06/19/2021 9:00 [place] Record Number: [id number] Patient Account Number: [id number] Date of Birth/Sex: Treating RN: 1961/12/25 (60 y.o. Elam Dutch Primary Care Provider: [name] Other Clinician: Referring Provider: Treating Provider/Extender: [name]: 0 Information Obtained from: Patient Chief Complaint Sacral ulcer Electronic Signature(s) Signed: 06/19/2021 11:58:24 AM By: [name] DO Entered By: [name] on 06/19/2021 10:17:10 -------------------------------------------------------------------------------- HPI Details Patient Name: Date of Service: [name], [name] 06/19/2021 9:00 [place] Record Number: [id number] Patient Account Number: [id number] Date of Birth/Sex: Treating RN: May 24, 1961 (60 y.o. Elam Dutch Primary Care Provider: [name] Other Clinician: Referring Provider: Treating Provider/Extender: [name]: 0 History of Present Illness HPI Description: Admission 6/24 Ms. Braydee Dosch is a 60 year old female with a past medical history of type 1 diabetes, right BKA, and kidney transplant that presents to the clinic for an 66-monthhistory of sacral ulcer. She has been using wet-to-dry dressings up until 5 weeks ago when she was started on a wound VAC. 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 She has completed 6 weeks of treatment and states she has been discharged by infectious disease. She currently denies any issues to the wound area. She reports minimal pain. She denies signs of infection. Electronic Signature(s) Signed: 06/19/2021 11:58:24 AM By: HKalman ShanDO Entered By: HKalman Shanon 06/19/2021  10:19:53 -------------------------------------------------------------------------------- Physical Exam Details Patient Name: Date of Service: MJACQUIA, SUMINSKI6/24/2022 9:00 A M Medical Record Number: [id number] Account Number: [id number] of Birth/Sex: Treating RN: [birth year](60y.o. FElam DutchPrimary Care Provider: MMelissa MontaneOther Clinician: Referring Provider: Treating Provider/Extender: HHoyt KochWeeks in Treatment: 0 Constitutional respirations regular, non-labored and within target range for patient..Marland KitchenPsychiatric pleasant and cooperative. Notes Sacral region: Large open wound with healthy granulation tissue present and undermining noted. No obvious signs of infection Electronic Signature(s) Signed: 06/19/2021 11:58:24 AM By: HKalman ShanDO Entered By: HKalman Shanon 06/19/2021 10:20:19 -------------------------------------------------------------------------------- Physician Orders Details Patient Name: Date of Service: MSAHARI, RAMCHARAN6/24/2022 9:00 [place]Record Number: [id number] Account Number: [id number] of Birth/Sex: Treating RN: [birth year](60y.o. FTonita Phoenix [name] Primary Care Provider: MMelissa MontaneOther Clinician: Referring Provider: Treating Provider/Extender: HFelicie Mornin Treatment: 0 Verbal / Phone Orders: No Diagnosis Coding ICD-10 Coding Code Description L89.153 Pressure ulcer of sacral region, stage 3 E10.9 Type 1 diabetes mellitus without complications [id number] absence of right leg below knee Z94.0 Kidney transplant status Follow-up Appointments ppointment in 2 weeks. - Dr. HHeber CarolinaReturn A Bathing/ Shower/ Hygiene Do not shower or bathe in tub. Negative Presssure Wound Therapy Wound Vac to wound continuously at 1221mhg pressure Black and White Foam combination - White foam packed into undermining and black foam to  rest of wound bed. Bridge with black foam to pt.'s side. Off-Loading Turn and reposition every 2 hours [place] [facility] this [place]; continue Home Health for wound care. May utilize formulary equivalent dressing for wound treatment orders unless otherwise specified. - [facility], Friday [facility] wound vac [facility]. Wound Treatment Wound #1 - Sacrum Cleanser: Wound Cleanser (Home Health) 3 x Per Week/15 Days Discharge Instructions: Cleanse the wound with wound cleanser prior to applying a clean dressing  using gauze sponges, not tissue or cotton balls. Prim Dressing: Wound vac 3 x Per Week/15 Days ary Discharge Instructions: White foam to be packed in undermining and black foam to rest of wound bed. Bridge with black foam to pt.'s side Prim Dressing: Wet-to-dry 3 x Per Week/15 Days ary Discharge Instructions: Pack wet-to-dry dressing in clinic today Electronic Signature(s) Signed: 06/19/2021 11:58:24 AM By: [name] DO Entered By: [name] on 06/19/2021 10:20:37 -------------------------------------------------------------------------------- Problem List Details Patient Name: Date of Service: [name], [name] 06/19/2021 9:00 [place] Record Number: [id number] Patient Account Number: [id number] Date of Birth/Sex: Treating RN: May 10, 1961 (60 y.o. Elam Dutch Primary Care Provider: [name] Other Clinician: Referring Provider: Treating Provider/Extender: [name]: 0 Active Problems ICD-10 Encounter Code Description Active Date MDM Diagnosis L89.153 Pressure ulcer of sacral region, stage 3 06/19/2021 No Yes E10.9 Type 1 diabetes mellitus without complications [id number] No Yes Z89.511 Acquired absence of right leg below knee 06/19/2021 No Yes Z94.0 Kidney transplant status 06/19/2021 No Yes Inactive Problems Resolved Problems Electronic  Signature(s) Signed: 06/19/2021 11:58:24 AM By: [name] DO Entered By: [name] on 06/19/2021 10:16:06 -------------------------------------------------------------------------------- Progress Note Details Patient Name: Date of Service: [name], [name] 06/19/2021 9:00 [place] Record Number: [id number] Patient Account Number: [id number] Date of Birth/Sex: Treating RN: Jun 05, 1961 (60 y.o. Elam Dutch Primary Care Provider: [name] Other Clinician: Referring Provider: Treating Provider/Extender: [name]: 0 Subjective Chief Complaint Information obtained from Patient Sacral ulcer History of Present Illness (HPI) Admission 6/24 Ms. Anastaisa Geffrard is a 60 year old female with a past medical history of type 1 diabetes, right BKA, and kidney transplant that presents to the clinic for an 18-monthhistory of sacral ulcer. She has been using wet-to-dry dressings up until 5 weeks ago when she was started on a wound VAC. 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 She has completed 6 weeks of treatment and states she has been discharged by infectious disease. She currently denies any issues to the wound area. She reports minimal pain. She denies signs of infection. Patient History Allergies No Known Allergies Family History Cancer - Father, Diabetes - Father, Heart Disease - Maternal Grandparents, Hypertension - Mother, Thyroid Problems - Mother,Siblings, No family history of Hereditary Spherocytosis, Kidney Disease, Lung Disease, Seizures, Stroke, Tuberculosis. Social History Former smoker - from teens to 334's Marital Status - Married, Alcohol Use - Rarely - beer, Drug Use - No History, Caffeine Use - Daily - coffee. Medical History Hematologic/Lymphatic Patient has history of Anemia Endocrine Patient has history of Type I Diabetes - s Genitourinary Patient  has history of End Stage Renal Disease - off dialysis since transplants Immunological Denies history of Lupus Erythematosus, Raynaudoos, Scleroderma Integumentary (Skin) Denies history of History of Burn Musculoskeletal Patient has history of Osteoarthritis, Osteomyelitis Neurologic Patient has history of Neuropathy Oncologic Patient has history of Received Radiation Denies history of Received Chemotherapy Patient is treated with Insulin. Blood sugar is tested. Hospitalization/Surgery History - infected wound (pressure) 2022. - (R) BKA amp Oct 2021. Review of Systems (ROS) Constitutional Symptoms (General Health) Denies complaints or symptoms of Fatigue, Fever, Chills, Marked Weight Change. Eyes Complains or has symptoms of Vision Changes, Glasses / Contacts - when she drives. Ear/Nose/Mouth/Throat Denies complaints or symptoms of Chronic sinus problems or rhinitis. Respiratory Denies complaints or symptoms of Chronic or frequent coughs, Shortness of Breath. Cardiovascular Denies complaints or symptoms  of Chest pain. Gastrointestinal Denies complaints or symptoms of Frequent diarrhea, Nausea, Vomiting. Genitourinary h/o kidney transplant - 3rd kidney in place now [facility] is her transplant location Integumentary (Skin) Complains or has symptoms of Wounds - wounds in past year. Musculoskeletal Complains or has symptoms of Muscle Weakness - generalized. Denies complaints or symptoms of Muscle Pain. Oncologic skin cancer with recurrence Psychiatric Denies complaints or symptoms of Claustrophobia, Suicidal. Objective Constitutional respirations regular, non-labored and within target range for patient.. Vitals Time Taken: 9:21 AM, Height: 53 in, Source: Stated, Weight: 110 lbs, Source: Stated, BMI: 27.5, Temperature: 98.2 F, Pulse: 86 bpm, Respiratory Rate: 16 breaths/min, Blood Pressure: 118/74 mmHg, Capillary Blood Glucose: 116 mg/dl. Psychiatric pleasant and  cooperative. General Notes: Sacral region: Large open wound with healthy granulation tissue present and undermining noted. No obvious signs of infection Integumentary (Hair, Skin) Wound #1 status is Open. Original cause of wound was Pressure Injury. The date acquired was: 02/24/2021. The wound is located on the Sacrum. The wound measures 7.3cm length x 4.3cm width x 1.5cm depth; 24.654cm^2 area and 36.98cm^3 volume. There is Fat Layer (Subcutaneous Tissue) exposed. There is no tunneling noted, however, there is undermining starting at 1:00 and ending at 4:00 with a maximum distance of 3cm. There is a medium amount of serosanguineous drainage noted. The wound margin is epibole. There is large (67-100%) pink granulation within the wound bed. There is no necrotic tissue within the wound bed. Assessment Active Problems ICD-10 Pressure ulcer of sacral region, stage 3 Type 1 diabetes mellitus without complications Acquired absence of right leg below knee Kidney transplant status Patient presents for an 4-monthhistory of chronically nonhealing sacral wound that was treated for osteomyelitis. Today the area looks clean with healthy granulation tissue present. I recommended continuing the wound VAC. She did not have white foam dressing in the undermining area today when the wound VAC was taken off. We will be clear in the orders that this needs to be placed along with the black foam in the wound bed. She has home health and they are changing it 3 times a week. We discussed the importance of aggressive offloading to this area as this will be the main reason why the wound will heal if there are no other barriers. I would like to see her at follow-up in 2 weeks and then likely once every month after that. 40 minutes was spent in the face-to-face encounter and EMR review Plan Follow-up Appointments: Return Appointment in 2 weeks. - Dr. HHeber CarolinaBathing/ Shower/ Hygiene: Do not shower or bathe in  tub. Negative Presssure Wound Therapy: Wound Vac to wound continuously at 1234mhg pressure Black and White Foam combination - White foam packed into undermining and black foam to rest of wound bed. Bridge with black foam to pt.'s side. Off-Loading: Turn and reposition every 2 hours Home Health: New wound care orders this week; continue Home Health for wound care. May utilize formulary equivalent dressing for wound treatment orders unless otherwise specified. - [facility], Wednesday, Friday for wound vac changes. WOUND #1: - Sacrum Wound Laterality: Cleanser: Wound Cleanser (Home Health) 3 x Per Week/15 Days Discharge Instructions: Cleanse the wound with wound cleanser prior to applying a clean dressing using gauze sponges, not tissue or cotton balls. Prim Dressing: Wound vac 3 x Per Week/15 Days ary Discharge Instructions: White foam to be packed in undermining and black foam to rest of wound bed. Bridge with black foam to pt.'s side  Prim Dressing: Wet-to-dry 3 x Per Week/15 Days ary Discharge Instructions: Pack wet-to-dry dressing in clinic today 1. Wound VAC and change with home health 3 times weekly 2. Follow-up in 2 weeks 3. Aggressive offloading Electronic Signature(s) Signed: 06/19/2021 11:58:24 AM By: [name] DO Entered By: [name] on 06/19/2021 11:57:59 -------------------------------------------------------------------------------- HxROS Details Patient Name: Date of Service: [name], [name] 06/19/2021 9:00 A M Medical Record Number: [name] Patient Account Number: [id number] Date of Birth/Sex: Treating RN: 09-11-1961 (60 y.o. [name] Primary Care Provider: [name] Other Clinician: Referring Provider: Treating Provider/Extender: [name]: 0 Constitutional Symptoms (General Health) Complaints and Symptoms: Negative for: Fatigue; Fever; Chills; Marked  Weight Change Eyes Complaints and Symptoms: Positive for: Vision Changes; Glasses / Contacts - when she drives Ear/Nose/Mouth/Throat Complaints and Symptoms: Negative for: Chronic sinus problems or rhinitis Respiratory Complaints and Symptoms: Negative for: Chronic or frequent coughs; Shortness of Breath Cardiovascular Complaints and Symptoms: Negative for: Chest pain Gastrointestinal Complaints and Symptoms: Negative for: Frequent diarrhea; Nausea; Vomiting Integumentary (Skin) Complaints and Symptoms: Positive for: Wounds - wounds in past year Medical History: Negative for: History of Burn Musculoskeletal Complaints and Symptoms: Positive for: Muscle Weakness - generalized Negative for: Muscle Pain Medical History: Positive for: Osteoarthritis; Osteomyelitis Psychiatric Complaints and Symptoms: Negative for: Claustrophobia; Suicidal Hematologic/Lymphatic Medical History: Positive for: Anemia Endocrine Medical History: Positive for: Type I Diabetes - s Time with diabetes: 54 years Treated with: Insulin Blood sugar tested every day: Yes Tested : constant with device Genitourinary Complaints and Symptoms: Review of System Notes: h/o kidney transplant - 3rd kidney in place now [facility] is her transplant location Medical History: Positive for: End Stage Renal Disease - off dialysis since transplants Immunological Medical History: Negative for: Lupus Erythematosus; Raynauds; Scleroderma Neurologic Medical History: Positive for: Neuropathy Oncologic Complaints and Symptoms: Review of System Notes: skin cancer with recurrence Medical History: Positive for: Received Radiation Negative for: Received Chemotherapy Immunizations Pneumococcal Vaccine: Received Pneumococcal Vaccination: Yes Immunization Notes: states being up to date on all - unknown dates for immuization Implantable Devices None Hospitalization / Surgery History Type of  Hospitalization/Surgery infected wound (pressure) 2022 (R) BKA amp Oct 2021 Family and Social History Cancer: Yes - Father; Diabetes: Yes - Father; Heart Disease: Yes - Maternal Grandparents; Hereditary Spherocytosis: No; Hypertension: Yes - Mother; Kidney Disease: No; Lung Disease: No; Seizures: No; Stroke: No; Thyroid Problems: Yes - Mother,Siblings; Tuberculosis: No; Former smoker - from teens to 86's; Marital Status - Married; Alcohol Use: Rarely - beer; Drug Use: No History; Caffeine Use: Daily - coffee; Financial Concerns: No; Food, Clothing or Shelter Needs: No; Support System Lacking: No; Transportation Concerns: No Electronic Signature(s) Signed: 07/15/2021 8:08:10 PM By: [name] Signed: 07/17/2021 2:46:51 PM By: [name] DO Previous Signature: 06/19/2021 11:58:24 AM Version By: [name] DO Previous Signature: 07/13/2021 9:13:38 PM Version By: [name] Entered By: [name] on 07/15/2021 20:06:49 -------------------------------------------------------------------------------- SuperBill Details Patient Name: Date of Service: [name], [name] 06/19/2021 Medical Record Number: [name] Patient Account Number: [id number] Date of Birth/Sex: Treating RN: [birth year] (60 y.o. [name], [name] Primary Care Provider: [name] Other Clinician: Referring Provider: Treating Provider/Extender: [name]: 0 Diagnosis Coding ICD-10 Codes Code Description [phone] Pressure ulcer of sacral region, stage 3 E10.9 Type 1 diabetes mellitus without complications 0000000 Acquired absence of right leg below knee Z94.0 Kidney transplant status Facility Procedures Physician Procedures : CPT4 Code Description Modifier [id number] - WC PHYS LEVEL  4 - NEW PT ICD-10 Diagnosis Description L89.153 Pressure ulcer of sacral region, stage 3 E10.9 Type 1 diabetes mellitus without complications 0000000 Acquired absence of  right leg below knee Quantity: 1 Electronic Signature(s) Signed: 06/19/2021 11:58:24 AM By: [name] DO Entered By: [name] on 06/19/2021 11:58:04

## 2021-07-18 DIAGNOSIS — L89154 Pressure ulcer of sacral region, stage 4: Secondary | ICD-10-CM | POA: Diagnosis not present

## 2021-07-19 DIAGNOSIS — L89154 Pressure ulcer of sacral region, stage 4: Secondary | ICD-10-CM | POA: Diagnosis not present

## 2021-07-20 DIAGNOSIS — R339 Retention of urine, unspecified: Secondary | ICD-10-CM | POA: Diagnosis not present

## 2021-07-20 DIAGNOSIS — L89522 Pressure ulcer of left ankle, stage 2: Secondary | ICD-10-CM | POA: Diagnosis not present

## 2021-07-20 DIAGNOSIS — D631 Anemia in chronic kidney disease: Secondary | ICD-10-CM | POA: Diagnosis not present

## 2021-07-20 DIAGNOSIS — K219 Gastro-esophageal reflux disease without esophagitis: Secondary | ICD-10-CM | POA: Diagnosis not present

## 2021-07-20 DIAGNOSIS — L89154 Pressure ulcer of sacral region, stage 4: Secondary | ICD-10-CM | POA: Diagnosis not present

## 2021-07-20 DIAGNOSIS — F321 Major depressive disorder, single episode, moderate: Secondary | ICD-10-CM | POA: Diagnosis not present

## 2021-07-20 DIAGNOSIS — N1831 Chronic kidney disease, stage 3a: Secondary | ICD-10-CM | POA: Diagnosis not present

## 2021-07-20 DIAGNOSIS — Z7952 Long term (current) use of systemic steroids: Secondary | ICD-10-CM | POA: Diagnosis not present

## 2021-07-20 DIAGNOSIS — Z466 Encounter for fitting and adjustment of urinary device: Secondary | ICD-10-CM | POA: Diagnosis not present

## 2021-07-20 DIAGNOSIS — E1069 Type 1 diabetes mellitus with other specified complication: Secondary | ICD-10-CM | POA: Diagnosis not present

## 2021-07-20 DIAGNOSIS — S98911S Complete traumatic amputation of right foot, level unspecified, sequela: Secondary | ICD-10-CM | POA: Diagnosis not present

## 2021-07-20 DIAGNOSIS — I129 Hypertensive chronic kidney disease with stage 1 through stage 4 chronic kidney disease, or unspecified chronic kidney disease: Secondary | ICD-10-CM | POA: Diagnosis not present

## 2021-07-20 DIAGNOSIS — E1022 Type 1 diabetes mellitus with diabetic chronic kidney disease: Secondary | ICD-10-CM | POA: Diagnosis not present

## 2021-07-20 DIAGNOSIS — C519 Malignant neoplasm of vulva, unspecified: Secondary | ICD-10-CM | POA: Diagnosis not present

## 2021-07-20 DIAGNOSIS — E785 Hyperlipidemia, unspecified: Secondary | ICD-10-CM | POA: Diagnosis not present

## 2021-07-20 DIAGNOSIS — R627 Adult failure to thrive: Secondary | ICD-10-CM | POA: Diagnosis not present

## 2021-07-20 DIAGNOSIS — Z435 Encounter for attention to cystostomy: Secondary | ICD-10-CM | POA: Diagnosis not present

## 2021-07-20 DIAGNOSIS — Z7982 Long term (current) use of aspirin: Secondary | ICD-10-CM | POA: Diagnosis not present

## 2021-07-21 DIAGNOSIS — L89154 Pressure ulcer of sacral region, stage 4: Secondary | ICD-10-CM | POA: Diagnosis not present

## 2021-07-21 DIAGNOSIS — R339 Retention of urine, unspecified: Secondary | ICD-10-CM | POA: Diagnosis not present

## 2021-07-22 DIAGNOSIS — C519 Malignant neoplasm of vulva, unspecified: Secondary | ICD-10-CM | POA: Diagnosis not present

## 2021-07-22 DIAGNOSIS — L89522 Pressure ulcer of left ankle, stage 2: Secondary | ICD-10-CM | POA: Diagnosis not present

## 2021-07-22 DIAGNOSIS — Z466 Encounter for fitting and adjustment of urinary device: Secondary | ICD-10-CM | POA: Diagnosis not present

## 2021-07-22 DIAGNOSIS — L89154 Pressure ulcer of sacral region, stage 4: Secondary | ICD-10-CM | POA: Diagnosis not present

## 2021-07-22 DIAGNOSIS — E785 Hyperlipidemia, unspecified: Secondary | ICD-10-CM | POA: Diagnosis not present

## 2021-07-22 DIAGNOSIS — Z435 Encounter for attention to cystostomy: Secondary | ICD-10-CM | POA: Diagnosis not present

## 2021-07-22 DIAGNOSIS — E1069 Type 1 diabetes mellitus with other specified complication: Secondary | ICD-10-CM | POA: Diagnosis not present

## 2021-07-22 DIAGNOSIS — N1831 Chronic kidney disease, stage 3a: Secondary | ICD-10-CM | POA: Diagnosis not present

## 2021-07-22 DIAGNOSIS — Z7982 Long term (current) use of aspirin: Secondary | ICD-10-CM | POA: Diagnosis not present

## 2021-07-22 DIAGNOSIS — E1022 Type 1 diabetes mellitus with diabetic chronic kidney disease: Secondary | ICD-10-CM | POA: Diagnosis not present

## 2021-07-22 DIAGNOSIS — K219 Gastro-esophageal reflux disease without esophagitis: Secondary | ICD-10-CM | POA: Diagnosis not present

## 2021-07-22 DIAGNOSIS — R339 Retention of urine, unspecified: Secondary | ICD-10-CM | POA: Diagnosis not present

## 2021-07-22 DIAGNOSIS — Z7952 Long term (current) use of systemic steroids: Secondary | ICD-10-CM | POA: Diagnosis not present

## 2021-07-22 DIAGNOSIS — F321 Major depressive disorder, single episode, moderate: Secondary | ICD-10-CM | POA: Diagnosis not present

## 2021-07-22 DIAGNOSIS — I129 Hypertensive chronic kidney disease with stage 1 through stage 4 chronic kidney disease, or unspecified chronic kidney disease: Secondary | ICD-10-CM | POA: Diagnosis not present

## 2021-07-22 DIAGNOSIS — D631 Anemia in chronic kidney disease: Secondary | ICD-10-CM | POA: Diagnosis not present

## 2021-07-23 DIAGNOSIS — L89154 Pressure ulcer of sacral region, stage 4: Secondary | ICD-10-CM | POA: Diagnosis not present

## 2021-07-24 DIAGNOSIS — R339 Retention of urine, unspecified: Secondary | ICD-10-CM | POA: Diagnosis not present

## 2021-07-24 DIAGNOSIS — I129 Hypertensive chronic kidney disease with stage 1 through stage 4 chronic kidney disease, or unspecified chronic kidney disease: Secondary | ICD-10-CM | POA: Diagnosis not present

## 2021-07-24 DIAGNOSIS — Z7982 Long term (current) use of aspirin: Secondary | ICD-10-CM | POA: Diagnosis not present

## 2021-07-24 DIAGNOSIS — L89522 Pressure ulcer of left ankle, stage 2: Secondary | ICD-10-CM | POA: Diagnosis not present

## 2021-07-24 DIAGNOSIS — F321 Major depressive disorder, single episode, moderate: Secondary | ICD-10-CM | POA: Diagnosis not present

## 2021-07-24 DIAGNOSIS — Z466 Encounter for fitting and adjustment of urinary device: Secondary | ICD-10-CM | POA: Diagnosis not present

## 2021-07-24 DIAGNOSIS — L89154 Pressure ulcer of sacral region, stage 4: Secondary | ICD-10-CM | POA: Diagnosis not present

## 2021-07-24 DIAGNOSIS — K219 Gastro-esophageal reflux disease without esophagitis: Secondary | ICD-10-CM | POA: Diagnosis not present

## 2021-07-24 DIAGNOSIS — Z7952 Long term (current) use of systemic steroids: Secondary | ICD-10-CM | POA: Diagnosis not present

## 2021-07-24 DIAGNOSIS — N1831 Chronic kidney disease, stage 3a: Secondary | ICD-10-CM | POA: Diagnosis not present

## 2021-07-24 DIAGNOSIS — Z435 Encounter for attention to cystostomy: Secondary | ICD-10-CM | POA: Diagnosis not present

## 2021-07-24 DIAGNOSIS — E1069 Type 1 diabetes mellitus with other specified complication: Secondary | ICD-10-CM | POA: Diagnosis not present

## 2021-07-24 DIAGNOSIS — D631 Anemia in chronic kidney disease: Secondary | ICD-10-CM | POA: Diagnosis not present

## 2021-07-24 DIAGNOSIS — E1022 Type 1 diabetes mellitus with diabetic chronic kidney disease: Secondary | ICD-10-CM | POA: Diagnosis not present

## 2021-07-24 DIAGNOSIS — C519 Malignant neoplasm of vulva, unspecified: Secondary | ICD-10-CM | POA: Diagnosis not present

## 2021-07-24 DIAGNOSIS — E785 Hyperlipidemia, unspecified: Secondary | ICD-10-CM | POA: Diagnosis not present

## 2021-07-25 DIAGNOSIS — L89154 Pressure ulcer of sacral region, stage 4: Secondary | ICD-10-CM | POA: Diagnosis not present

## 2021-07-26 DIAGNOSIS — L89154 Pressure ulcer of sacral region, stage 4: Secondary | ICD-10-CM | POA: Diagnosis not present

## 2021-07-27 ENCOUNTER — Other Ambulatory Visit: Payer: Self-pay

## 2021-07-27 ENCOUNTER — Encounter (HOSPITAL_BASED_OUTPATIENT_CLINIC_OR_DEPARTMENT_OTHER): Payer: BC Managed Care – PPO | Attending: Internal Medicine | Admitting: Internal Medicine

## 2021-07-27 DIAGNOSIS — E10622 Type 1 diabetes mellitus with other skin ulcer: Secondary | ICD-10-CM | POA: Insufficient documentation

## 2021-07-27 DIAGNOSIS — E1022 Type 1 diabetes mellitus with diabetic chronic kidney disease: Secondary | ICD-10-CM | POA: Insufficient documentation

## 2021-07-27 DIAGNOSIS — Z888 Allergy status to other drugs, medicaments and biological substances status: Secondary | ICD-10-CM | POA: Insufficient documentation

## 2021-07-27 DIAGNOSIS — E1040 Type 1 diabetes mellitus with diabetic neuropathy, unspecified: Secondary | ICD-10-CM | POA: Insufficient documentation

## 2021-07-27 DIAGNOSIS — Z940 Kidney transplant status: Secondary | ICD-10-CM

## 2021-07-27 DIAGNOSIS — Z833 Family history of diabetes mellitus: Secondary | ICD-10-CM | POA: Insufficient documentation

## 2021-07-27 DIAGNOSIS — N186 End stage renal disease: Secondary | ICD-10-CM | POA: Insufficient documentation

## 2021-07-27 DIAGNOSIS — Z89511 Acquired absence of right leg below knee: Secondary | ICD-10-CM

## 2021-07-27 DIAGNOSIS — Z87891 Personal history of nicotine dependence: Secondary | ICD-10-CM | POA: Diagnosis not present

## 2021-07-27 DIAGNOSIS — L89154 Pressure ulcer of sacral region, stage 4: Secondary | ICD-10-CM | POA: Diagnosis not present

## 2021-07-27 DIAGNOSIS — L89153 Pressure ulcer of sacral region, stage 3: Secondary | ICD-10-CM | POA: Diagnosis not present

## 2021-07-27 DIAGNOSIS — Z8349 Family history of other endocrine, nutritional and metabolic diseases: Secondary | ICD-10-CM | POA: Diagnosis not present

## 2021-07-27 DIAGNOSIS — Z8249 Family history of ischemic heart disease and other diseases of the circulatory system: Secondary | ICD-10-CM | POA: Insufficient documentation

## 2021-07-27 DIAGNOSIS — E109 Type 1 diabetes mellitus without complications: Secondary | ICD-10-CM | POA: Diagnosis not present

## 2021-07-27 NOTE — Progress Notes (Addendum)
Doris, Lopez ([id number]) Visit Report for 07/27/2021 Chief Complaint Document Details Patient Name: Date of Service: [name], [name] 07/27/2021 1:30 PM Medical Record Number: [id number] Patient Account Number: [id number] Date of Birth/Sex: Treating RN: 1961-05-17 (60 y.o. [name] Primary Care Provider: [name] Other Clinician: Referring Provider: Treating Provider/Extender: [name]: 5 Information Obtained from: Patient Chief Complaint Sacral ulcer Electronic Signature(s) Signed: 07/27/2021 3:43:07 PM By: [name] DO Entered By: [name] on 07/27/2021 15:36:10 -------------------------------------------------------------------------------- HPI Details Patient Name: Date of Service: Doris, Lopez 07/27/2021 1:30 PM Medical Record Number: [id number] Patient Account Number: [id number] Date of Birth/Sex: Treating RN: 1961/10/20 (60 y.o. [name] Primary Care Provider: [name] Other Clinician: Referring Provider: Treating Provider/Extender: [name]: 5 History of Present Illness HPI Description: Admission 6/24 Ms. Talecia Greunke is a 60 year old female with a past medical history of type 1 diabetes, right BKA, and kidney transplant that presents to the clinic for an 50-monthhistory of sacral ulcer. She has been using wet-to-dry dressings up until 5 weeks ago when she was started on a wound VAC. 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 She has completed 6 weeks of treatment and states she has been discharged by infectious disease. She currently denies any issues to the wound area. She reports minimal pain. She denies signs of infection. 7/11; patient presents for 2-week follow-up. She uses a wound VAC that is changed with home health 3 times a week. She denies any signs of infection. She has no  complaints or issues today. 8/1; patient presents for follow-up. She continues to use a wound VAC and this is changed with home health. She reports pain to the sacrum that started 1 week ago. She denies systemic signs of infection. Electronic Signature(s) Signed: 07/27/2021 3:43:07 PM By: HKalman ShanDO Entered By: HKalman Shanon 07/27/2021 15:37:01 -------------------------------------------------------------------------------- Physical Exam Details Patient Name: Date of Service: Lopez, LINDENMEYER8/12/2020 1:30 PM Medical Record Number: [id number] Account Number: [id number] of Birth/Sex: Treating RN: 108/31/1962(60y.o. FNancy FetterPrimary Care Provider: MMelissa MontaneOther Clinician: Referring Provider: Treating Provider/Extender: HHoyt KochWeeks in Treatment: 5 Constitutional respirations regular, non-labored and within target range for patient..Marland KitchenPsychiatric pleasant and cooperative. Notes Sacral region: Large open wound with granulation tissue present. Undermining circumferentially. Slight erythema to the soft tissue surrounding the wound. No purulent drainage noted. Tenderness to palpation over the bony prominences Circumferentially. Electronic Signature(s) Signed: 07/27/2021 3:43:07 PM By: HKalman ShanDO Entered By: HKalman Shanon 07/27/2021 15:38:32 -------------------------------------------------------------------------------- Physician Orders Details Patient Name: Date of Service: MHELANA, KNABLE8/12/2020 1:30 PM Medical Record Number: [id number] Account Number: [id number] of Birth/Sex: Treating RN: [birth year](60y.o. FElam DutchPrimary Care Provider: MMelissa MontaneOther Clinician: Referring Provider: Treating Provider/Extender: HFelicie Mornin Treatment: 5 Verbal / Phone Orders: No Diagnosis Coding ICD-10 Coding Code Description L89.153 Pressure  ulcer of sacral region, stage 3 E10.9 Type 1 diabetes mellitus without complications [id number] absence of right leg below knee Z94.0 Kidney transplant status Follow-up Appointments ppointment in 2 weeks. - with Dr. HHeber CarolinaReturn A Bathing/ Shower/ Hygiene Do not shower or bathe in tub. Negative Presssure Wound Therapy Wound Vac to wound continuously at 1245mhg pressure - hold VAC for 2 weeks Black and White Foam combination - White foam packed into undermining and black foam to rest of  wound bed. Lay drape and bridge with black foam to pt.'s side. Off-Loading Turn and reposition every 2 hours Additional Orders / Instructions Follow [facility] wound care orders this week; continue Home Health for wound care. May utilize formulary equivalent dressing for wound treatment orders unless otherwise specified. - hold VAC Dressing changes to be completed by [facility] on Monday / Wednesday / Friday except when patient has scheduled visit at [facility]. Other Home Health Orders/Instructions: - [facility] Wound Treatment Wound #1 - Sacrum Cleanser: Wound Cleanser ([place]) 1 x Per Day/30 Days Discharge Instructions: Cleanse the wound with wound cleanser prior to applying a clean dressing using gauze sponges, not tissue or cotton balls. Prim Dressing: Dakin's Solution 0.125%, 16 (oz) 1 x Per Day/30 Days ary Discharge Instructions: Moisten gauze with Dakin's solution and pack lightly into wound Secondary Dressing: Zetuvit Plus Silicone Border Dressing 7x7(in/in) 1 x Per Day/30 Days Discharge Instructions: Apply silicone border over primary dressing as directed. Laboratory naerobe culture (MICRO) Bacteria identified in Unspecified specimen by A LOINC Code: [id number] Convenience Name: Anerobic culture Radiology Magnetic Resonance Imaging (MRI) without contrast of pelvis - stage 3 pressure ulcer of sacrum CPT 72195 - (ICD10 L89.153 - Pressure ulcer of sacral  region, stage 3) Patient Medications llergies: No Known Allergies A Notifications Medication Indication Start End 07/27/2021 Dakin's Solution DOSE 1 - miscellaneous 0.25 % solution - wet to dry dressings daily Electronic Signature(s) Signed: 07/27/2021 4:51:20 PM By: [name] DO Previous Signature: 07/27/2021 3:43:07 PM Version By: [name] DO Entered By: [name] on 07/27/2021 16:51:20 Prescription 07/27/2021 -------------------------------------------------------------------------------- Bing Matter DO Patient Name: Provider: 1961-06-30 [id number] Date of Birth: NPI#Corliss Marcus Sex: DEA #: [phone] Phone #: License #: [place] Patient Address: [address], [place] [address] [place], [place] [address] [phone] Allergies No Known Allergies Provider's Orders Magnetic Resonance Imaging (MRI) without contrast of pelvis - ICD10: L89.153 - stage 3 pressure ulcer of sacrum CPT 72195 Hand Signature: Date(s): Electronic Signature(s) Signed: 07/27/2021 5:40:19 PM By: [name] DO Previous Signature: 07/27/2021 3:43:07 PM Version By: [name] DO Entered By: [name] on 07/27/2021 16:51:20 -------------------------------------------------------------------------------- Problem List Details Patient Name: Date of Service: DAVIONNE, MCEUEN 07/27/2021 1:30 PM Medical Record Number: [id number] Patient Account Number: [id number] Date of Birth/Sex: Treating RN: 15-Aug-1961 (60 y.o. [name] Primary Care Provider: [name] Other Clinician: Referring Provider: Treating Provider/Extender: [name]: 5 Active Problems ICD-10 Encounter Code Description Active Date MDM Diagnosis L89.153 Pressure ulcer of sacral region, stage 3 06/19/2021 No Yes E10.9 Type 1  diabetes mellitus without complications [id number] No Yes Z89.511 Acquired absence of right leg below knee 06/19/2021 No Yes Z94.0 Kidney transplant status 06/19/2021 No Yes Inactive Problems Resolved Problems Electronic Signature(s) Signed: 07/27/2021 3:43:07 PM By: [name] DO Entered By: [name] on 07/27/2021 15:35:39 -------------------------------------------------------------------------------- Progress Note Details Patient Name: Date of Service: KYNDAL, CORRA 07/27/2021 1:30 PM Medical Record Number: [id number] Patient Account Number: [id number] Date of Birth/Sex: Treating RN: 05/21/1961 (60 y.o. [name] Primary Care Provider: [name] Other Clinician: Referring Provider: Treating Provider/Extender: [name]: 5 Subjective Chief Complaint Information obtained from Patient Sacral ulcer History of Present Illness (HPI) Admission 6/24 Ms. Keirsten Hamann is a 60 year old female with a past medical history of type 1 diabetes, right BKA, and  kidney transplant that presents to the clinic for an 75-monthhistory of sacral ulcer. She has been using wet-to-dry dressings up until 5 weeks ago when she was started on a wound VAC. 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 She has completed 6 weeks of treatment and states she has been discharged by infectious disease. She currently denies any issues to the wound area. She reports minimal pain. She denies signs of infection. 7/11; patient presents for 2-week follow-up. She uses a wound VAC that is changed with home health 3 times a week. She denies any signs of infection. She has no complaints or issues today. 8/1; patient presents for follow-up. She continues to use a wound VAC and this is changed with home health. She reports pain to the sacrum that started 1 week ago. She denies systemic signs of infection. Patient  History Information obtained from Patient. Family History Cancer - Father, Diabetes - Father, Heart Disease - Maternal Grandparents, Hypertension - Mother, Thyroid Problems - Mother,Siblings, No family history of Hereditary Spherocytosis, Kidney Disease, Lung Disease, Seizures, Stroke, Tuberculosis. Social History Former smoker - from teens to 89's Marital Status - Married, Alcohol Use - Rarely - beer, Drug Use - No History, Caffeine Use - Daily - coffee. Medical History Hematologic/Lymphatic Patient has history of Anemia Endocrine Patient has history of Type I Diabetes - s Genitourinary Patient has history of End Stage Renal Disease - off dialysis since transplants Immunological Denies history of Lupus Erythematosus, Raynaudoos, Scleroderma Integumentary (Skin) Denies history of History of Burn Musculoskeletal Patient has history of Osteoarthritis, Osteomyelitis Neurologic Patient has history of Neuropathy Oncologic Patient has history of Received Radiation Denies history of Received Chemotherapy Hospitalization/Surgery History - infected wound (pressure) 2022. - (R) BKA amp Oct 2021. Objective Constitutional respirations regular, non-labored and within target range for patient.. Vitals Time Taken: 2:00 PM, Height: 53 in, Weight: 110 lbs, BMI: 27.5, Temperature: 98.3 F, Pulse: 87 bpm, Respiratory Rate: 16 breaths/min, Blood Pressure: 99/54 mmHg, Capillary Blood Glucose: 97 mg/dl. General Notes: glucose per pt report Psychiatric pleasant and cooperative. General Notes: Sacral region: Large open wound with granulation tissue present. Undermining circumferentially. Slight erythema to the soft tissue surrounding the wound. No purulent drainage noted. Tenderness to palpation over the bony prominences Circumferentially. Integumentary (Hair, Skin) Wound #1 status is Open. Original cause of wound was Pressure Injury. The date acquired was: 02/24/2021. The wound has been in treatment 5  weeks. The wound is located on the Sacrum. The wound measures 7cm length x 5cm width x 1.5cm depth; 27.489cm^2 area and 41.233cm^3 volume. There is Fat Layer (Subcutaneous Tissue) exposed. There is no tunneling noted, however, there is undermining starting at 8:00 and ending at 1:00 with a maximum distance of 2.5cm. There is a medium amount of serosanguineous drainage noted. The wound margin is well defined and not attached to the wound base. There is large (67- 100%) pink granulation within the wound bed. There is a small (1-33%) amount of necrotic tissue within the wound bed including Adherent Slough. Assessment Active Problems ICD-10 Pressure ulcer of sacral region, stage 3 Type 1 diabetes mellitus without complications Acquired absence of right leg below knee Kidney transplant status Patient's wound is stable. The wound bed appears slightly more pale than last clinic visit. There is smild erythema surrounding the soft tissue. Patient is now reporting pain over the bony areas on palpation. At this time I will stop the wound VAC and use Dakin's wet to  dry dressings. I would also like to obtain an MRI of the area since she has a history of osteomyelitis s/p IV antibiotics to this area. We also obtained a wound culture. Based on exam findings is not imperative to start antibiotics at this time. We will wait for the culture results. Plan Follow-up Appointments: Return Appointment in 2 weeks. - with Dr. Heber Soldier Bathing/ Shower/ Hygiene: Do not shower or bathe in tub. Negative Presssure Wound Therapy: Wound Vac to wound continuously at 133m/hg pressure - hold VAC for 2 weeks Black and White Foam combination - White foam packed into undermining and black foam to rest of wound bed. Lay drape and bridge with black foam to pt.'s side. Off-Loading: Turn and reposition every 2 hours Additional Orders / Instructions: Follow Nutritious Diet Home Health: New wound care orders this week; continue  Home Health for wound care. May utilize formulary equivalent dressing for wound treatment orders unless otherwise specified. - hold VAC Dressing [facility] / Wednesday / Friday except [facility] Radiology ordered were: Magnetic Resonance Imaging (MRI) without contrast of pelvis - stage 3 pressure ulcer of sacrum CPT 72195 The following medication(s) was prescribed: Dakin's Solution miscellaneous 0.25 % solution 1 wet to dry dressings daily starting 07/27/2021 WOUND #1: - Sacrum Wound Laterality: Cleanser: [place]ound Cleanser ([place] 1 x Per Day/30 Days Discharge Instructions: Cleanse the wound with wound cleanser prior to applying a clean dressing using gauze sponges, not tissue or cotton balls. Prim Dressing: Dakin's Solution 0.125%, 16 (oz) 1 x Per Day/30 Days ary Discharge Instructions: Moisten gauze with Dakin's solution and pack lightly into wound Secondary Dressing: Zetuvit Plus Silicone Border Dressing 7x7(in/in) 1 x Per Day/30 Days Discharge Instructions: Apply silicone border over primary dressing as directed. 1. Stop wound VAC 2. Dakin's wet-to-dry 3. MRI - pelvis 4. Offered follow-up in 1 week however patient would like to do 2-week follow-up. 5. Wound culture Electronic Signature(s) Signed: 07/27/2021 5:40:19 PM By: HKalman ShanDO Previous Signature: 07/27/2021 3:43:07 PM Version By: HKalman ShanDO Entered By: HKalman Shanon 07/27/2021 16:51:29 -------------------------------------------------------------------------------- HxROS Details Patient Name: Date of Service: MCHANTHA, CROZIER8/12/2020 1:30 PM Medical Record Number: [id number] Account Number: [id number] of Birth/Sex: Treating RN: 106-Jul-1962(60y.o. FNancy FetterPrimary Care Provider: MMelissa MontaneOther  Clinician: Referring Provider: Treating Provider/Extender: HFelicie Mornin Treatment: 5 Information Obtained From Patient Hematologic/Lymphatic Medical History: Positive for: Anemia Endocrine Medical History: Positive for: Type I Diabetes - s Time with diabetes: 570years Treated with: Insulin Blood sugar tested every day: Yes Tested : constant with device Genitourinary Medical History: Positive for: End Stage Renal Disease - off dialysis since transplants Immunological Medical History: Negative for: Lupus Erythematosus; Raynauds; Scleroderma Integumentary (Skin) Medical History: Negative for: History of Burn Musculoskeletal Medical History: Positive for: Osteoarthritis; Osteomyelitis Neurologic Medical History: Positive for: Neuropathy Oncologic Medical History: Positive for: Received Radiation Negative for: Received Chemotherapy Immunizations Pneumococcal Vaccine: Received Pneumococcal Vaccination: Yes Received Pneumococcal Vaccination On or After 60th Birthday: No Immunization Notes: states being up to date on all - unknown dates for immuization Implantable Devices None Hospitalization / Surgery History Type of Hospitalization/Surgery infected wound (pressure) 2022 (R) BKA amp Oct 2021 Family and Social History Cancer: Yes - Father; Diabetes: Yes - Father; Heart Disease: Yes - Maternal Grandparents; Hereditary Spherocytosis: No; Hypertension: Yes -  Mother; Kidney Disease: No; Lung Disease: No; Seizures: No; Stroke: No; Thyroid Problems: Yes - Mother,Siblings; Tuberculosis: No; Former smoker - from teens to 54's; Marital Status - Married; Alcohol Use: Rarely - beer; Drug Use: No History; Caffeine Use: Daily - coffee; Financial Concerns: No; Food, Clothing or Shelter Needs: No; Support System Lacking: No; Transportation Concerns: No Electronic Signature(s) Signed: 07/27/2021 3:43:07 PM By: [name] DO Signed: 07/27/2021 5:51:46 PM  By: Levan Hurst RN, BSN Entered By: [name] on 07/27/2021 15:37:13 -------------------------------------------------------------------------------- [place] Details Patient Name: Date of Service: DARONDA, GAUR 07/27/2021 Medical Record Number: [id number] Patient Account Number: [id number] Date of Birth/Sex: Treating RN: 03/19/1961 (60 y.o. [name] Primary Care Provider: [name] Other Clinician: Referring Provider: Treating Provider/Extender: [name]: 5 Diagnosis Coding ICD-10 Codes Code Description [phone] Pressure ulcer of sacral region, stage 3 E10.9 Type 1 diabetes mellitus without complications 0000000 Acquired absence of right leg below knee Z94.0 Kidney transplant status Facility Procedures CPT4 Code: [id number] Description: 99213 - WOUND CARE VISIT-LEV 3 EST PT Modifier: Quantity: 1 Physician Procedures : CPT4 Code Description Modifier [id number] - WC PHYS LEVEL 4 - EST PT ICD-10 Diagnosis Description L89.153 Pressure ulcer of sacral region, stage 3 E10.9 Type 1 diabetes mellitus without complications 0000000 Acquired absence of right leg below knee  Z94.0 Kidney transplant status Quantity: 1 Electronic Signature(s) Signed: 08/10/2021 11:45:24 AM By: Levan Hurst RN, BSN Signed: 08/13/2021 2:38:54 PM By: [name] DO Previous Signature: 07/27/2021 3:43:07 PM Version By: [name] DO Entered By: Levan Hurst on 08/10/2021 11:41:57

## 2021-07-27 NOTE — Progress Notes (Addendum)
[name], [name] ([id number]) Visit Report for 07/27/2021 Arrival Information Details Patient Name: Date of Service: [name], [name] 07/27/2021 1:30 PM Medical Record Number: [id number] Patient Account Number: [id number] Date of Birth/Sex: Treating RN: 1961-03-13 (60 y.o. [name] Primary Care [name]: [name] Other Clinician: Referring [name]: Treating [name]/Extender: [name] in Treatment: 5 Visit Information History Since Last Visit Added or deleted any medications: No Patient Arrived: Ambulatory Any new allergies or adverse reactions: No Arrival Time: 13:59 Had a fall or experienced change in No Accompanied By: husband activities of daily living that may affect Transfer Assistance: None risk of falls: Patient Identification Verified: Yes Signs or symptoms of abuse/neglect since last visito No Secondary Verification Process Completed: Yes Hospitalized since last visit: No Patient Requires Transmission-Based Precautions: No Implantable device outside of the clinic excluding No Patient Has Alerts: No cellular tissue based products placed in the center since last visit: Has Dressing in Place as Prescribed: Yes Has Compression in Place as Prescribed: Yes Pain Present Now: No Electronic Signature(s) Signed: 07/27/2021 5:51:46 PM By: Doris Hurst RN, BSN Entered By: [name] on 07/27/2021 14:11:49 -------------------------------------------------------------------------------- Clinic Level of Care Assessment Details Patient Name: Date of Service: [name], [name] 07/27/2021 1:30 PM Medical Record Number: [id number] Patient Account Number: [id number] Date of Birth/Sex: Treating RN: [birth year] (60 y.o. [name] Primary Care [name]: [name] Other Clinician: Referring [name]: Treating [name]/Extender: [name] in Treatment: 5 Clinic Level of Care Assessment  Items TOOL 4 Quantity Score X- 1 0 Use when only an EandM is performed on FOLLOW-UP visit ASSESSMENTS - Nursing Assessment / Reassessment X- 1 10 Reassessment of Co-morbidities (includes updates in patient status) X- 1 5 Reassessment of Adherence to Treatment Plan ASSESSMENTS - Wound and Skin A ssessment / Reassessment X - Simple Wound Assessment / Reassessment - one wound 1 5 '[]'$  - 0 Complex Wound Assessment / Reassessment - multiple wounds '[]'$  - 0 Dermatologic / Skin Assessment (not related to wound area) ASSESSMENTS - Focused Assessment '[]'$  - 0 Circumferential Edema Measurements - multi extremities '[]'$  - 0 Nutritional Assessment / Counseling / Intervention '[]'$  - 0 Lower Extremity Assessment (monofilament, tuning fork, pulses) '[]'$  - 0 Peripheral Arterial Disease Assessment (using hand held doppler) ASSESSMENTS - Ostomy and/or Continence Assessment and Care '[]'$  - 0 Incontinence Assessment and Management '[]'$  - 0 Ostomy Care Assessment and Management (repouching, etc.) PROCESS - Coordination of Care X - Simple Patient / Family Education for ongoing care 1 15 '[]'$  - 0 Complex (extensive) Patient / Family Education for ongoing care X- 1 10 Staff obtains Programmer, systems, Records, T Results / Process Orders est '[]'$  - 0 Staff telephones HHA, Nursing Homes / Clarify orders / etc '[]'$  - 0 Routine Transfer to another Facility (non-emergent condition) '[]'$  - 0 Routine Hospital Admission (non-emergent condition) '[]'$  - 0 New Admissions / Biomedical engineer / Ordering NPWT Apligraf, etc. , '[]'$  - 0 Emergency Hospital Admission (emergent condition) X- 1 10 Simple Discharge Coordination '[]'$  - 0 Complex (extensive) Discharge Coordination PROCESS - Special Needs '[]'$  - 0 Pediatric / Minor Patient Management '[]'$  - 0 Isolation Patient Management '[]'$  - 0 Hearing / Language / Visual special needs '[]'$  - 0 Assessment of Community assistance (transportation, D/C planning, etc.) '[]'$  - 0 Additional  assistance / Altered mentation '[]'$  - 0 Support Surface(s) Assessment (bed, cushion, seat, etc.) INTERVENTIONS - Wound Cleansing / Measurement X - Simple Wound Cleansing - one wound 1 5 '[]'$  - 0 Complex Wound  Cleansing - multiple wounds X- 1 5 Wound Imaging (photographs - any number of wounds) '[]'$  - 0 Wound Tracing (instead of photographs) X- 1 5 Simple Wound Measurement - one wound '[]'$  - 0 Complex Wound Measurement - multiple wounds INTERVENTIONS - Wound Dressings X - Small Wound Dressing one or multiple wounds 1 10 '[]'$  - 0 Medium Wound Dressing one or multiple wounds '[]'$  - 0 Large Wound Dressing one or multiple wounds X- 1 5 Application of Medications - topical '[]'$  - 0 Application of Medications - injection INTERVENTIONS - Miscellaneous '[]'$  - 0 External ear exam '[]'$  - 0 Specimen Collection (cultures, biopsies, blood, body fluids, etc.) '[]'$  - 0 Specimen(s) / Culture(s) sent or taken to Lab for analysis '[]'$  - 0 Patient Transfer (multiple staff / Civil Service fast streamer / Similar devices) '[]'$  - 0 Simple Staple / Suture removal (25 or less) '[]'$  - 0 Complex Staple / Suture removal (26 or more) '[]'$  - 0 Hypo / Hyperglycemic Management (close monitor of Blood Glucose) '[]'$  - 0 Ankle / Brachial Index (ABI) - do not check if billed separately X- 1 5 Vital Signs Has the patient been seen at the hospital within the last three years: Yes Total Score: 90 Level Of Care: New/Established - Level 3 Electronic Signature(s) Signed: 08/10/2021 11:45:24 AM By: Doris Hurst RN, BSN Entered By: [name] on 08/10/2021 11:41:50 -------------------------------------------------------------------------------- Encounter Discharge Information Details Patient Name: Date of Service: [name], [name] 07/27/2021 1:30 PM Medical Record Number: [id number] Patient Account Number: [id number] Date of Birth/Sex: Treating RN: 15-Feb-1961 (60 y.o. [name] Primary Care Jayla Mackie: [name] Other  Clinician: Referring [name]: Treating [name]/Extender: [name] in Treatment: 5 Encounter Discharge Information Items Discharge Condition: Stable Ambulatory Status: Wheelchair Discharge Destination: Home Transportation: Private Auto Accompanied By: spouse Schedule Follow-up Appointment: Yes Clinical Summary of Care: Patient Declined Electronic Signature(s) Signed: 07/27/2021 6:11:21 PM By: Baruch Gouty RN, BSN Entered By: Baruch Gouty on 07/27/2021 15:59:28 -------------------------------------------------------------------------------- Multi Wound Chart Details Patient Name: Date of Service: [name], [name] 07/27/2021 1:30 PM Medical Record Number: [id number] Patient Account Number: [id number] Date of Birth/Sex: Treating RN: [birth year] (60 y.o. [name] Primary Care Srihith Aquilino: [name] Other Clinician: Referring Enya Bureau: Treating Lysette Lindenbaum/Extender: [name]: 5 Vital Signs Height(in): 53 Capillary Blood Glucose(mg/dl): 97 Weight(lbs): 110 Pulse(bpm): 87 Body Mass Index(BMI): 28 Blood Pressure(mmHg): 99/54 Temperature(F): 98.3 Respiratory Rate(breaths/min): 16 Photos: [1:Sacrum] [N/A:N/A N/A] Wound Location: [1:Pressure Injury] [N/A:N/A] Wounding Event: [1:Pressure Ulcer] [N/A:N/A] Primary Etiology: [1:Anemia, Type I Diabetes, End Stage N/A] Comorbid History: [1:Renal Disease, Osteoarthritis, Osteomyelitis, Neuropathy, Received Radiation 02/24/2021] [N/A:N/A] Date Acquired: [1:5] [N/A:N/A] Weeks of Treatment: [1:Open] [N/A:N/A] Wound Status: [1:7x5x1.5] [N/A:N/A] Measurements L x W x D (cm) [1:27.489] [N/A:N/A] A (cm) : rea [1:41.233] [N/A:N/A] Volume (cm) : [1:-11.50%] [N/A:N/A] % Reduction in A rea: [1:-11.50%] [N/A:N/A] % Reduction in Volume: [1:8] Starting Position 1 (o'clock): [1:1] Ending Position 1 (o'clock): [1:2.5] Maximum Distance 1 (cm): [1:Yes]  [N/A:N/A] Undermining: [1:Category/Stage III] [N/A:N/A] Classification: [1:Medium] [N/A:N/A] Exudate A mount: [1:Serosanguineous] [N/A:N/A] Exudate Type: [1:red, brown] [N/A:N/A] Exudate Color: [1:Well defined, not attached] [N/A:N/A] Wound Margin: [1:Large (67-100%)] [N/A:N/A] Granulation A mount: [1:Pink] [N/A:N/A] Granulation Quality: [1:Small (1-33%)] [N/A:N/A] Necrotic A mount: [1:Fat Layer (Subcutaneous Tissue): Yes N/A] Exposed Structures: [1:Fascia: No Tendon: No Muscle: No Joint: No Bone: No Small (1-33%)] [N/A:N/A] Treatment Notes Electronic Signature(s) Signed: 07/27/2021 3:43:07 PM By: [name] DO Signed: 07/27/2021 5:51:46 PM By: Doris Hurst RN, BSN Entered By: [name] on 07/27/2021  15:35:46 -------------------------------------------------------------------------------- Multi-Disciplinary Care Plan Details Patient Name: Date of Service: [name], [name] 07/27/2021 1:30 PM Medical Record Number: [id number] Patient Account Number: [id number] Date of Birth/Sex: Treating RN: 1961/05/05 (60 y.o. [name] Primary Care Brynnley Dayrit: [name] Other Clinician: Referring Damonie Ellenwood: Treating Daylin Eads/Extender: [name]: 5 Active Inactive Wound/Skin Impairment Nursing Diagnoses: Impaired tissue integrity Knowledge deficit related to ulceration/compromised skin integrity Goals: Patient/caregiver will verbalize understanding of skin care regimen Date Initiated: 06/19/2021 Target Resolution Date: 08/28/2021 Goal Status: Active Ulcer/skin breakdown will have a volume reduction of 30% by week 4 Date Initiated: 06/19/2021 Date Inactivated: 07/27/2021 Target Resolution Date: 07/02/2021 Goal Status: Unmet Unmet Reason: pressure relief Interventions: Assess patient/caregiver ability to obtain necessary supplies Assess patient/caregiver ability to perform ulcer/skin care regimen upon admission and as  needed Assess ulceration(s) every visit Provide education on ulcer and skin care Notes: Electronic Signature(s) Signed: 07/27/2021 5:51:46 PM By: Doris Hurst RN, BSN Entered By: [name] on 07/27/2021 14:13:40 -------------------------------------------------------------------------------- Pain Assessment Details Patient Name: Date of Service: [name], [name] 07/27/2021 1:30 PM Medical Record Number: [id number] Patient Account Number: [id number] Date of Birth/Sex: Treating RN: 08-Apr-1961 (60 y.o. [name] Primary Care Oumar Marcott: [name] Other Clinician: Referring Caylon Saine: Treating Juanette Urizar/Extender: [name]: 5 Active Problems Location of Pain Severity and Description of Pain Patient Has Paino Yes Site Locations Rate the pain. Current Pain Level: 5 Character of Pain Describe the Pain: Throbbing Pain Management and Medication Current Pain Management: Medication: Yes Cold Application: No Rest: No Massage: No Activity: No T.E.N.S.: No Heat Application: No Leg drop or elevation: No Is the Current Pain Management Adequate: Adequate How does your wound impact your activities of daily livingo Sleep: No Bathing: No Appetite: No Relationship With Others: No Bladder Continence: No Emotions: No Bowel Continence: No Work: No Toileting: No Drive: No Dressing: No Hobbies: No Electronic Signature(s) Signed: 07/27/2021 5:51:46 PM By: Doris Hurst RN, BSN Entered By: [name] on 07/27/2021 14:12:32 -------------------------------------------------------------------------------- Patient/Caregiver Education Details Patient Name: Date of Service: [name] 8/1/2022andnbsp1:30 PM Medical Record Number: [id number] Patient Account Number: [id number] Date of Birth/Gender: Treating RN: 1961/10/02 (60 y.o. [name] Primary Care Physician: [name] Other Clinician: Referring  Physician: Treating Physician/Extender: [name] in Treatment: 5 Education Assessment Education Provided To: Patient Education Topics Provided Wound/Skin Impairment: Methods: Explain/Verbal Responses: State content correctly Electronic Signature(s) Signed: 07/27/2021 5:51:46 PM By: Doris Hurst RN, BSN Entered By: [name] on 07/27/2021 14:13:50 -------------------------------------------------------------------------------- Wound Assessment Details Patient Name: Date of Service: [name], [name] 07/27/2021 1:30 PM Medical Record Number: [id number] Patient Account Number: [id number] Date of Birth/Sex: Treating RN: Feb 24, 1961 (60 y.o. [name] Primary Care Danylah Holden: [name] Other Clinician: Referring Stana Bayon: Treating Rodrecus Belsky/Extender: [name]: 5 Wound Status Wound Number: 1 Primary Pressure Ulcer Etiology: Wound Location: Sacrum Wound Open Wounding Event: Pressure Injury Status: Date Acquired: 02/24/2021 Comorbid Anemia, Type I Diabetes, End Stage Renal Disease, Osteoarthritis, Weeks Of Treatment: 5 History: Osteomyelitis, Neuropathy, Received Radiation Clustered Wound: No Photos Wound Measurements Length: (cm) 7 Width: (cm) 5 Depth: (cm) 1.5 Area: (cm) 27.489 Volume: (cm) 41.233 % Reduction in Area: -11.5% % Reduction in Volume: -11.5% Epithelialization: Small (1-33%) Tunneling: No Undermining: Yes Starting Position (o'clock): 8 Ending Position (o'clock): 1 Maximum Distance: (cm) 2.5 Wound Description Classification: Category/Stage III Wound Margin: Well defined, not attached Exudate Amount: Medium Exudate Type: Serosanguineous Exudate Color: red, brown Foul Odor  After Cleansing: No Slough/Fibrino Yes Wound Bed Granulation Amount: Large (67-100%) Exposed Structure Granulation Quality: Pink Fascia Exposed: No Necrotic Amount: Small (1-33%) Fat Layer  (Subcutaneous Tissue) Exposed: Yes Necrotic Quality: Adherent Slough Tendon Exposed: No Muscle Exposed: No Joint Exposed: No Bone Exposed: No Treatment Notes Wound #1 (Sacrum) Cleanser Wound Cleanser Discharge Instruction: Cleanse the wound with wound cleanser prior to applying a clean dressing using gauze sponges, not tissue or cotton balls. Peri-Wound Care Topical Primary Dressing Dakin's Solution 0.125%, 16 (oz) Discharge Instruction: Moisten gauze with Dakin's solution and pack lightly into wound Secondary Dressing Zetuvit Plus Silicone Border Dressing 7x7(in/in) Discharge Instruction: Apply silicone border over primary dressing as directed. Secured With Compression Wrap Compression Stockings Environmental education officer) Signed: 07/27/2021 5:51:46 PM By: Doris Hurst RN, BSN Entered By: [name] on 07/27/2021 14:10:11 -------------------------------------------------------------------------------- Vitals Details Patient Name: Date of Service: [name], [name] 07/27/2021 1:30 PM Medical Record Number: [id number] Patient Account Number: [id number] Date of Birth/Sex: Treating RN: 01/02/1961 (60 y.o. [name] Primary Care Marjean Imperato: [name] Other Clinician: Referring Demarius Archila: Treating Jessey Huyett/Extender: [name]: 5 Vital Signs Time Taken: 14:00 Temperature (F): 98.3 Height (in): 53 Pulse (bpm): 87 Weight (lbs): 110 Respiratory Rate (breaths/min): 16 Body Mass Index (BMI): 27.5 Blood Pressure (mmHg): 99/54 Capillary Blood Glucose (mg/dl): 97 Reference Range: 80 - 120 mg / dl Notes glucose per pt report Electronic Signature(s) Signed: 07/27/2021 5:51:46 PM By: Doris Hurst RN, BSN Entered By: [name] on 07/27/2021 14:12:19

## 2021-07-28 ENCOUNTER — Other Ambulatory Visit (HOSPITAL_COMMUNITY): Payer: Self-pay | Admitting: Internal Medicine

## 2021-07-28 ENCOUNTER — Other Ambulatory Visit: Payer: Self-pay | Admitting: Internal Medicine

## 2021-07-28 DIAGNOSIS — I129 Hypertensive chronic kidney disease with stage 1 through stage 4 chronic kidney disease, or unspecified chronic kidney disease: Secondary | ICD-10-CM | POA: Diagnosis not present

## 2021-07-28 DIAGNOSIS — Z7982 Long term (current) use of aspirin: Secondary | ICD-10-CM | POA: Diagnosis not present

## 2021-07-28 DIAGNOSIS — L89522 Pressure ulcer of left ankle, stage 2: Secondary | ICD-10-CM | POA: Diagnosis not present

## 2021-07-28 DIAGNOSIS — L89153 Pressure ulcer of sacral region, stage 3: Secondary | ICD-10-CM

## 2021-07-28 DIAGNOSIS — E785 Hyperlipidemia, unspecified: Secondary | ICD-10-CM | POA: Diagnosis not present

## 2021-07-28 DIAGNOSIS — E1022 Type 1 diabetes mellitus with diabetic chronic kidney disease: Secondary | ICD-10-CM | POA: Diagnosis not present

## 2021-07-28 DIAGNOSIS — K219 Gastro-esophageal reflux disease without esophagitis: Secondary | ICD-10-CM | POA: Diagnosis not present

## 2021-07-28 DIAGNOSIS — L89154 Pressure ulcer of sacral region, stage 4: Secondary | ICD-10-CM | POA: Diagnosis not present

## 2021-07-28 DIAGNOSIS — E1069 Type 1 diabetes mellitus with other specified complication: Secondary | ICD-10-CM | POA: Diagnosis not present

## 2021-07-28 DIAGNOSIS — F321 Major depressive disorder, single episode, moderate: Secondary | ICD-10-CM | POA: Diagnosis not present

## 2021-07-28 DIAGNOSIS — Z435 Encounter for attention to cystostomy: Secondary | ICD-10-CM | POA: Diagnosis not present

## 2021-07-28 DIAGNOSIS — D631 Anemia in chronic kidney disease: Secondary | ICD-10-CM | POA: Diagnosis not present

## 2021-07-28 DIAGNOSIS — Z466 Encounter for fitting and adjustment of urinary device: Secondary | ICD-10-CM | POA: Diagnosis not present

## 2021-07-28 DIAGNOSIS — C519 Malignant neoplasm of vulva, unspecified: Secondary | ICD-10-CM | POA: Diagnosis not present

## 2021-07-28 DIAGNOSIS — Z7952 Long term (current) use of systemic steroids: Secondary | ICD-10-CM | POA: Diagnosis not present

## 2021-07-28 DIAGNOSIS — N1831 Chronic kidney disease, stage 3a: Secondary | ICD-10-CM | POA: Diagnosis not present

## 2021-07-28 DIAGNOSIS — R339 Retention of urine, unspecified: Secondary | ICD-10-CM | POA: Diagnosis not present

## 2021-07-29 ENCOUNTER — Other Ambulatory Visit (HOSPITAL_COMMUNITY)
Admission: RE | Admit: 2021-07-29 | Discharge: 2021-07-29 | Disposition: A | Discharge status: Home / Self Care | Payer: BC Managed Care – PPO | Source: Other Acute Inpatient Hospital | Attending: Internal Medicine | Admitting: Internal Medicine

## 2021-07-29 DIAGNOSIS — L98429 Non-pressure chronic ulcer of back with unspecified severity: Secondary | ICD-10-CM | POA: Insufficient documentation

## 2021-07-29 DIAGNOSIS — Z7952 Long term (current) use of systemic steroids: Secondary | ICD-10-CM | POA: Diagnosis not present

## 2021-07-29 DIAGNOSIS — R339 Retention of urine, unspecified: Secondary | ICD-10-CM | POA: Diagnosis not present

## 2021-07-29 DIAGNOSIS — L89154 Pressure ulcer of sacral region, stage 4: Secondary | ICD-10-CM | POA: Diagnosis not present

## 2021-07-29 DIAGNOSIS — I129 Hypertensive chronic kidney disease with stage 1 through stage 4 chronic kidney disease, or unspecified chronic kidney disease: Secondary | ICD-10-CM | POA: Diagnosis not present

## 2021-07-29 DIAGNOSIS — K219 Gastro-esophageal reflux disease without esophagitis: Secondary | ICD-10-CM | POA: Diagnosis not present

## 2021-07-29 DIAGNOSIS — L89522 Pressure ulcer of left ankle, stage 2: Secondary | ICD-10-CM | POA: Diagnosis not present

## 2021-07-29 DIAGNOSIS — C519 Malignant neoplasm of vulva, unspecified: Secondary | ICD-10-CM | POA: Diagnosis not present

## 2021-07-29 DIAGNOSIS — L89322 Pressure ulcer of left buttock, stage 2: Secondary | ICD-10-CM | POA: Diagnosis not present

## 2021-07-29 DIAGNOSIS — Z466 Encounter for fitting and adjustment of urinary device: Secondary | ICD-10-CM | POA: Diagnosis not present

## 2021-07-29 DIAGNOSIS — F321 Major depressive disorder, single episode, moderate: Secondary | ICD-10-CM | POA: Diagnosis not present

## 2021-07-29 DIAGNOSIS — E1069 Type 1 diabetes mellitus with other specified complication: Secondary | ICD-10-CM | POA: Diagnosis not present

## 2021-07-29 DIAGNOSIS — L089 Local infection of the skin and subcutaneous tissue, unspecified: Secondary | ICD-10-CM | POA: Diagnosis not present

## 2021-07-29 DIAGNOSIS — Z435 Encounter for attention to cystostomy: Secondary | ICD-10-CM | POA: Diagnosis not present

## 2021-07-29 DIAGNOSIS — N1831 Chronic kidney disease, stage 3a: Secondary | ICD-10-CM | POA: Diagnosis not present

## 2021-07-29 DIAGNOSIS — E1022 Type 1 diabetes mellitus with diabetic chronic kidney disease: Secondary | ICD-10-CM | POA: Diagnosis not present

## 2021-07-29 DIAGNOSIS — D631 Anemia in chronic kidney disease: Secondary | ICD-10-CM | POA: Diagnosis not present

## 2021-07-29 DIAGNOSIS — E785 Hyperlipidemia, unspecified: Secondary | ICD-10-CM | POA: Diagnosis not present

## 2021-07-29 DIAGNOSIS — Z7982 Long term (current) use of aspirin: Secondary | ICD-10-CM | POA: Diagnosis not present

## 2021-07-30 DIAGNOSIS — L89154 Pressure ulcer of sacral region, stage 4: Secondary | ICD-10-CM | POA: Diagnosis not present

## 2021-07-31 DIAGNOSIS — K219 Gastro-esophageal reflux disease without esophagitis: Secondary | ICD-10-CM | POA: Diagnosis not present

## 2021-07-31 DIAGNOSIS — Z435 Encounter for attention to cystostomy: Secondary | ICD-10-CM | POA: Diagnosis not present

## 2021-07-31 DIAGNOSIS — R339 Retention of urine, unspecified: Secondary | ICD-10-CM | POA: Diagnosis not present

## 2021-07-31 DIAGNOSIS — E1069 Type 1 diabetes mellitus with other specified complication: Secondary | ICD-10-CM | POA: Diagnosis not present

## 2021-07-31 DIAGNOSIS — E785 Hyperlipidemia, unspecified: Secondary | ICD-10-CM | POA: Diagnosis not present

## 2021-07-31 DIAGNOSIS — D631 Anemia in chronic kidney disease: Secondary | ICD-10-CM | POA: Diagnosis not present

## 2021-07-31 DIAGNOSIS — F321 Major depressive disorder, single episode, moderate: Secondary | ICD-10-CM | POA: Diagnosis not present

## 2021-07-31 DIAGNOSIS — L89154 Pressure ulcer of sacral region, stage 4: Secondary | ICD-10-CM | POA: Diagnosis not present

## 2021-07-31 DIAGNOSIS — C519 Malignant neoplasm of vulva, unspecified: Secondary | ICD-10-CM | POA: Diagnosis not present

## 2021-07-31 DIAGNOSIS — Z7982 Long term (current) use of aspirin: Secondary | ICD-10-CM | POA: Diagnosis not present

## 2021-07-31 DIAGNOSIS — E1022 Type 1 diabetes mellitus with diabetic chronic kidney disease: Secondary | ICD-10-CM | POA: Diagnosis not present

## 2021-07-31 DIAGNOSIS — Z466 Encounter for fitting and adjustment of urinary device: Secondary | ICD-10-CM | POA: Diagnosis not present

## 2021-07-31 DIAGNOSIS — L89522 Pressure ulcer of left ankle, stage 2: Secondary | ICD-10-CM | POA: Diagnosis not present

## 2021-07-31 DIAGNOSIS — N1831 Chronic kidney disease, stage 3a: Secondary | ICD-10-CM | POA: Diagnosis not present

## 2021-07-31 DIAGNOSIS — I129 Hypertensive chronic kidney disease with stage 1 through stage 4 chronic kidney disease, or unspecified chronic kidney disease: Secondary | ICD-10-CM | POA: Diagnosis not present

## 2021-07-31 DIAGNOSIS — Z7952 Long term (current) use of systemic steroids: Secondary | ICD-10-CM | POA: Diagnosis not present

## 2021-07-31 LAB — AEROBIC CULTURE W GRAM STAIN (SUPERFICIAL SPECIMEN)

## 2021-08-01 DIAGNOSIS — L89154 Pressure ulcer of sacral region, stage 4: Secondary | ICD-10-CM | POA: Diagnosis not present

## 2021-08-02 DIAGNOSIS — L89154 Pressure ulcer of sacral region, stage 4: Secondary | ICD-10-CM | POA: Diagnosis not present

## 2021-08-03 DIAGNOSIS — Z435 Encounter for attention to cystostomy: Secondary | ICD-10-CM | POA: Diagnosis not present

## 2021-08-03 DIAGNOSIS — R339 Retention of urine, unspecified: Secondary | ICD-10-CM | POA: Diagnosis not present

## 2021-08-03 DIAGNOSIS — N1831 Chronic kidney disease, stage 3a: Secondary | ICD-10-CM | POA: Diagnosis not present

## 2021-08-03 DIAGNOSIS — E1022 Type 1 diabetes mellitus with diabetic chronic kidney disease: Secondary | ICD-10-CM | POA: Diagnosis not present

## 2021-08-03 DIAGNOSIS — Z466 Encounter for fitting and adjustment of urinary device: Secondary | ICD-10-CM | POA: Diagnosis not present

## 2021-08-03 DIAGNOSIS — D631 Anemia in chronic kidney disease: Secondary | ICD-10-CM | POA: Diagnosis not present

## 2021-08-03 DIAGNOSIS — L89522 Pressure ulcer of left ankle, stage 2: Secondary | ICD-10-CM | POA: Diagnosis not present

## 2021-08-03 DIAGNOSIS — C519 Malignant neoplasm of vulva, unspecified: Secondary | ICD-10-CM | POA: Diagnosis not present

## 2021-08-03 DIAGNOSIS — L89154 Pressure ulcer of sacral region, stage 4: Secondary | ICD-10-CM | POA: Diagnosis not present

## 2021-08-03 DIAGNOSIS — I129 Hypertensive chronic kidney disease with stage 1 through stage 4 chronic kidney disease, or unspecified chronic kidney disease: Secondary | ICD-10-CM | POA: Diagnosis not present

## 2021-08-03 DIAGNOSIS — F321 Major depressive disorder, single episode, moderate: Secondary | ICD-10-CM | POA: Diagnosis not present

## 2021-08-03 DIAGNOSIS — E785 Hyperlipidemia, unspecified: Secondary | ICD-10-CM | POA: Diagnosis not present

## 2021-08-03 DIAGNOSIS — Z7982 Long term (current) use of aspirin: Secondary | ICD-10-CM | POA: Diagnosis not present

## 2021-08-03 DIAGNOSIS — E1069 Type 1 diabetes mellitus with other specified complication: Secondary | ICD-10-CM | POA: Diagnosis not present

## 2021-08-03 DIAGNOSIS — Z7952 Long term (current) use of systemic steroids: Secondary | ICD-10-CM | POA: Diagnosis not present

## 2021-08-03 DIAGNOSIS — K219 Gastro-esophageal reflux disease without esophagitis: Secondary | ICD-10-CM | POA: Diagnosis not present

## 2021-08-04 DIAGNOSIS — G8929 Other chronic pain: Secondary | ICD-10-CM | POA: Diagnosis not present

## 2021-08-04 DIAGNOSIS — D631 Anemia in chronic kidney disease: Secondary | ICD-10-CM | POA: Diagnosis not present

## 2021-08-04 DIAGNOSIS — L89154 Pressure ulcer of sacral region, stage 4: Secondary | ICD-10-CM | POA: Diagnosis not present

## 2021-08-04 DIAGNOSIS — Z0283 Encounter for blood-alcohol and blood-drug test: Secondary | ICD-10-CM | POA: Diagnosis not present

## 2021-08-04 DIAGNOSIS — I10 Essential (primary) hypertension: Secondary | ICD-10-CM | POA: Diagnosis not present

## 2021-08-04 DIAGNOSIS — N1831 Chronic kidney disease, stage 3a: Secondary | ICD-10-CM | POA: Diagnosis not present

## 2021-08-04 DIAGNOSIS — E1021 Type 1 diabetes mellitus with diabetic nephropathy: Secondary | ICD-10-CM | POA: Diagnosis not present

## 2021-08-04 DIAGNOSIS — E782 Mixed hyperlipidemia: Secondary | ICD-10-CM | POA: Diagnosis not present

## 2021-08-04 DIAGNOSIS — N189 Chronic kidney disease, unspecified: Secondary | ICD-10-CM | POA: Diagnosis not present

## 2021-08-05 DIAGNOSIS — L89154 Pressure ulcer of sacral region, stage 4: Secondary | ICD-10-CM | POA: Diagnosis not present

## 2021-08-06 ENCOUNTER — Ambulatory Visit (HOSPITAL_COMMUNITY)
Admission: RE | Admit: 2021-08-06 | Discharge: 2021-08-06 | Disposition: A | Discharge status: Home / Self Care | Payer: BC Managed Care – PPO | Source: Ambulatory Visit | Attending: Internal Medicine | Admitting: Internal Medicine

## 2021-08-06 ENCOUNTER — Ambulatory Visit (HOSPITAL_COMMUNITY): Payer: BC Managed Care – PPO

## 2021-08-06 ENCOUNTER — Other Ambulatory Visit: Payer: Self-pay

## 2021-08-06 ENCOUNTER — Encounter (HOSPITAL_COMMUNITY): Payer: Self-pay

## 2021-08-06 DIAGNOSIS — M1612 Unilateral primary osteoarthritis, left hip: Secondary | ICD-10-CM | POA: Diagnosis not present

## 2021-08-06 DIAGNOSIS — R600 Localized edema: Secondary | ICD-10-CM | POA: Diagnosis not present

## 2021-08-06 DIAGNOSIS — L89153 Pressure ulcer of sacral region, stage 3: Secondary | ICD-10-CM | POA: Insufficient documentation

## 2021-08-06 DIAGNOSIS — L89154 Pressure ulcer of sacral region, stage 4: Secondary | ICD-10-CM | POA: Diagnosis not present

## 2021-08-06 DIAGNOSIS — L89159 Pressure ulcer of sacral region, unspecified stage: Secondary | ICD-10-CM | POA: Diagnosis not present

## 2021-08-06 DIAGNOSIS — M25452 Effusion, left hip: Secondary | ICD-10-CM | POA: Diagnosis not present

## 2021-08-07 DIAGNOSIS — E1022 Type 1 diabetes mellitus with diabetic chronic kidney disease: Secondary | ICD-10-CM | POA: Diagnosis not present

## 2021-08-07 DIAGNOSIS — I129 Hypertensive chronic kidney disease with stage 1 through stage 4 chronic kidney disease, or unspecified chronic kidney disease: Secondary | ICD-10-CM | POA: Diagnosis not present

## 2021-08-07 DIAGNOSIS — D631 Anemia in chronic kidney disease: Secondary | ICD-10-CM | POA: Diagnosis not present

## 2021-08-07 DIAGNOSIS — Z466 Encounter for fitting and adjustment of urinary device: Secondary | ICD-10-CM | POA: Diagnosis not present

## 2021-08-07 DIAGNOSIS — K219 Gastro-esophageal reflux disease without esophagitis: Secondary | ICD-10-CM | POA: Diagnosis not present

## 2021-08-07 DIAGNOSIS — C519 Malignant neoplasm of vulva, unspecified: Secondary | ICD-10-CM | POA: Diagnosis not present

## 2021-08-07 DIAGNOSIS — E1069 Type 1 diabetes mellitus with other specified complication: Secondary | ICD-10-CM | POA: Diagnosis not present

## 2021-08-07 DIAGNOSIS — Z7952 Long term (current) use of systemic steroids: Secondary | ICD-10-CM | POA: Diagnosis not present

## 2021-08-07 DIAGNOSIS — Z435 Encounter for attention to cystostomy: Secondary | ICD-10-CM | POA: Diagnosis not present

## 2021-08-07 DIAGNOSIS — Z7982 Long term (current) use of aspirin: Secondary | ICD-10-CM | POA: Diagnosis not present

## 2021-08-07 DIAGNOSIS — E785 Hyperlipidemia, unspecified: Secondary | ICD-10-CM | POA: Diagnosis not present

## 2021-08-07 DIAGNOSIS — F321 Major depressive disorder, single episode, moderate: Secondary | ICD-10-CM | POA: Diagnosis not present

## 2021-08-07 DIAGNOSIS — L89154 Pressure ulcer of sacral region, stage 4: Secondary | ICD-10-CM | POA: Diagnosis not present

## 2021-08-07 DIAGNOSIS — L89522 Pressure ulcer of left ankle, stage 2: Secondary | ICD-10-CM | POA: Diagnosis not present

## 2021-08-07 DIAGNOSIS — R339 Retention of urine, unspecified: Secondary | ICD-10-CM | POA: Diagnosis not present

## 2021-08-07 DIAGNOSIS — N1831 Chronic kidney disease, stage 3a: Secondary | ICD-10-CM | POA: Diagnosis not present

## 2021-08-08 DIAGNOSIS — L89154 Pressure ulcer of sacral region, stage 4: Secondary | ICD-10-CM | POA: Diagnosis not present

## 2021-08-09 DIAGNOSIS — L89154 Pressure ulcer of sacral region, stage 4: Secondary | ICD-10-CM | POA: Diagnosis not present

## 2021-08-10 ENCOUNTER — Encounter (HOSPITAL_BASED_OUTPATIENT_CLINIC_OR_DEPARTMENT_OTHER): Payer: BC Managed Care – PPO | Admitting: Internal Medicine

## 2021-08-10 ENCOUNTER — Other Ambulatory Visit: Payer: Self-pay

## 2021-08-10 DIAGNOSIS — Z940 Kidney transplant status: Secondary | ICD-10-CM

## 2021-08-10 DIAGNOSIS — L89154 Pressure ulcer of sacral region, stage 4: Secondary | ICD-10-CM | POA: Diagnosis not present

## 2021-08-10 DIAGNOSIS — Z833 Family history of diabetes mellitus: Secondary | ICD-10-CM | POA: Diagnosis not present

## 2021-08-10 DIAGNOSIS — E10622 Type 1 diabetes mellitus with other skin ulcer: Secondary | ICD-10-CM | POA: Diagnosis not present

## 2021-08-10 DIAGNOSIS — Z8349 Family history of other endocrine, nutritional and metabolic diseases: Secondary | ICD-10-CM | POA: Diagnosis not present

## 2021-08-10 DIAGNOSIS — L89153 Pressure ulcer of sacral region, stage 3: Secondary | ICD-10-CM

## 2021-08-10 DIAGNOSIS — E109 Type 1 diabetes mellitus without complications: Secondary | ICD-10-CM

## 2021-08-10 DIAGNOSIS — Z888 Allergy status to other drugs, medicaments and biological substances status: Secondary | ICD-10-CM | POA: Diagnosis not present

## 2021-08-10 DIAGNOSIS — Z89511 Acquired absence of right leg below knee: Secondary | ICD-10-CM

## 2021-08-10 DIAGNOSIS — Z87891 Personal history of nicotine dependence: Secondary | ICD-10-CM | POA: Diagnosis not present

## 2021-08-10 DIAGNOSIS — N186 End stage renal disease: Secondary | ICD-10-CM | POA: Diagnosis not present

## 2021-08-10 DIAGNOSIS — Z8249 Family history of ischemic heart disease and other diseases of the circulatory system: Secondary | ICD-10-CM | POA: Diagnosis not present

## 2021-08-10 DIAGNOSIS — E1040 Type 1 diabetes mellitus with diabetic neuropathy, unspecified: Secondary | ICD-10-CM | POA: Diagnosis not present

## 2021-08-10 DIAGNOSIS — E1022 Type 1 diabetes mellitus with diabetic chronic kidney disease: Secondary | ICD-10-CM | POA: Diagnosis not present

## 2021-08-10 NOTE — Progress Notes (Signed)
[name], [name] ([id number]) Visit Report for 08/10/2021 Chief Complaint Document Details Patient Name: Date of Service: [name], [name] 08/10/2021 1:30 PM Medical Record Number: [id number] Patient Account Number: [id number] Date of Birth/Sex: Treating RN: 03/24/1961 (60 y.o. [name] Primary Care Provider: Melissa Lopez Other Clinician: Referring Provider: Treating Provider/Extender: [name] [name]: 7 Information Obtained from: Patient Chief Complaint Sacral ulcer Electronic Signature(s) Signed: 08/10/2021 3:27:57 PM By: [name] DO Entered By: [name] on 08/10/2021 15:21:43 -------------------------------------------------------------------------------- HPI Details Patient Name: Date of Service: [name], [name] 08/10/2021 1:30 PM Medical Record Number: [id number] Patient Account Number: [id number] Date of Birth/Sex: Treating RN: Mar 19, 1961 (60 y.o. [name] Primary Care Provider: Melissa Lopez Other Clinician: Referring Provider: Treating Provider/Extender: [name] [name]: 7 History of Present Illness HPI Description: Admission 6/24 [name] is a 60 year old female with a past medical history of type 1 diabetes, right BKA, and kidney transplant that presents to the clinic for an 83-monthhistory of sacral ulcer. She has been using wet-to-dry dressings up until 5 weeks ago when she was started on a wound VAC. 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 She has completed 6 weeks of treatment and states she has been discharged by infectious disease. She currently denies any issues to the wound area. She reports minimal pain. She denies signs of infection. 7/11; patient presents for 2-week follow-up. She uses a wound VAC that is changed with home health 3 times a week. She denies any signs of infection. She has  no complaints or issues today. 8/1; patient presents for follow-up. She continues to use a wound VAC and this is changed with home health. She reports pain to the sacrum that started 1 week ago. She denies systemic signs of infection. 8/15; patient presents for 2-week follow-up. She has been using wet-to-dry dressings daily. She reports improvement in the wound size and appearance. She denies infectious symptoms. Electronic Signature(s) Signed: 08/10/2021 3:27:57 PM By: HKalman ShanDO Entered By: HKalman Shanon 08/10/2021 15:22:57 -------------------------------------------------------------------------------- Physical Exam Details Patient Name: Date of Service: [name], STREIF8/15/2022 1:30 PM Medical Record Number: [id number] Account Number: [id number] of Birth/Sex: Treating RN: [birth year](60y.o. FSue LushPrimary Care Provider: MMelissa MontaneOther Clinician: Referring Provider: Treating Provider/Extender: HHoyt KochWeeks in Treatment: 7 Constitutional respirations regular, non-labored and within target range for patient..Marland KitchenPsychiatric pleasant and cooperative. Notes Sacral region: Large open wound with granulation tissue present. Undermining circumferentially. No obvious signs of infection. Electronic Signature(s) Signed: 08/10/2021 3:27:57 PM By: HKalman ShanDO Entered By: HKalman Shanon 08/10/2021 15:23:38 -------------------------------------------------------------------------------- Physician Orders Details Patient Name: Date of Service: [name], MEINS8/15/2022 1:30 PM Medical Record Number: [id number] Account Number: [id number] of Birth/Sex: Treating RN: 1October 23, 1962(60y.o. FSue LushPrimary Care Provider: MMelissa MontaneOther Clinician: Referring Provider: Treating Provider/Extender: HFelicie Mornin Treatment: 7 Verbal / Phone Orders:  No Diagnosis Coding ICD-10 Coding Code Description L89.153 Pressure ulcer of sacral region, stage 3 E10.9 Type 1 diabetes mellitus without complications [id number] absence of right leg below knee Z94.0 Kidney transplant status Follow-up Appointments Return appointment in 1 month. - with Dr. HHeber CarolinaBathing/ Shower/ Hygiene Do not shower or bathe in tub. Off-Loading Turn and reposition every 2 hours Additional Orders / Instructions Follow Nutritious Diet Home Health No change in wound care orders this week; continue Home  Health for wound care. May utilize formulary equivalent dressing for wound treatment orders unless otherwise specified. Dressing changes to be completed by [place] on Monday / Wednesday / Friday except when patient has scheduled visit at [facility]. Other Home Health Orders/Instructions: - [place] [facility] Therapy Evaluate for HBO Therapy - Patient to think about HBO treatment Wound Treatment Wound #1 - Sacrum Cleanser: Wound Cleanser ([place]) 1 x Per Day/30 Days Discharge Instructions: Cleanse the wound with wound cleanser prior to applying a clean dressing using gauze sponges, not tissue or cotton balls. Prim Dressing: Dakin's Solution 0.125%, 16 (oz) (Home Health) 1 x Per Day/30 Days ary Discharge Instructions: Moisten gauze with Dakin's solution and pack lightly into wound Secondary Dressing: Woven Gauze Sponge, Non-Sterile 4x4 in (Home Health) 1 x Per Day/30 Days Discharge Instructions: Apply over primary dressing as directed. Secondary Dressing: Zetuvit Plus Silicone Border Dressing 7x7(in/in) (Home Health) 1 x Per Day/30 Days Discharge Instructions: Apply silicone border over primary dressing as directed. Consults Infectious Disease - Chronic Osteomyelitis - (ICD10 L89.153 - Pressure ulcer of sacral region, stage 3) Electronic Signature(s) Signed: 08/10/2021 3:27:57 PM By: [name] DO Entered By: [name] on 08/10/2021 15:24:19 Prescription 08/10/2021 -------------------------------------------------------------------------------- Bing Matter DO Patient Name: Provider: September 02, 1961 [id number] Date of Birth: NPI#Corliss Marcus Sex: DEA #: [phone] Phone #: License #: [place] Patient Address: [address] [place], [place] [address] [place], [place] [address] [phone] Allergies No Known Allergies Provider's Orders Infectious Disease - ICD10: L89.153 - Chronic Osteomyelitis Hand Signature: Date(s): Electronic Signature(s) Signed: 08/10/2021 3:27:57 PM By: [name] DO Entered By: [name] on 08/10/2021 15:24:19 -------------------------------------------------------------------------------- Problem List Details Patient Name: Date of Service: [name], [name] 08/10/2021 1:30 PM Medical Record Number: [id number] Patient Account Number: [id number] Date of Birth/Sex: Treating RN: [birth year] (60 y.o. [name] Primary Care Provider: Other Clinician: Melissa Lopez Referring Provider: Treating Provider/Extender: [name] [name]: 7 Active Problems ICD-10 Encounter Code Description Active Date MDM Diagnosis L89.153 Pressure ulcer of sacral region, stage 3 06/19/2021 No Yes E10.9 Type 1 diabetes mellitus without complications [id number] No Yes Z89.511 Acquired absence of right leg below knee 06/19/2021 No Yes Z94.0 Kidney transplant status 06/19/2021 No Yes Inactive Problems Resolved Problems Electronic Signature(s) Signed: 08/10/2021 3:27:57 PM By: [name] DO Previous Signature: 08/10/2021 1:54:50 PM Version By: [name] Entered By: [name] on 08/10/2021 15:21:23 -------------------------------------------------------------------------------- Progress Note  Details Patient Name: Date of Service: [name], [name] 08/10/2021 1:30 PM Medical Record Number: [id number] Patient Account Number: [id number] Date of Birth/Sex: Treating RN: [birth year] (60 y.o. [name] Primary Care Provider: Melissa Lopez Other Clinician: Referring Provider: Treating Provider/Extender: [name] [name]: 7 Subjective Chief Complaint Information obtained from Patient Sacral ulcer History of Present Illness (HPI) Admission 6/24 Ms. Saranne Pinta is a 60 year old female with a past medical history of type 1 diabetes, right BKA, and kidney transplant that presents to the clinic for an 17-monthhistory of sacral ulcer. She has been using wet-to-dry dressings up until 5 weeks ago when she was started on a wound VAC. 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 She has completed 6 weeks of treatment and states she has been discharged by infectious disease. She currently denies any issues to the wound area. She reports minimal pain.  She denies signs of infection. 7/11; patient presents for 2-week follow-up. She uses a wound VAC that is changed with home health 3 times a week. She denies any signs of infection. She has no complaints or issues today. 8/1; patient presents for follow-up. She continues to use a wound VAC and this is changed with home health. She reports pain to the sacrum that started 1 week ago. She denies systemic signs of infection. 8/15; patient presents for 2-week follow-up. She has been using wet-to-dry dressings daily. She reports improvement in the wound size and appearance. She denies infectious symptoms. Patient History Information obtained from Patient. Family History Cancer - Father, Diabetes - Father, Heart Disease - Maternal Grandparents, Hypertension - Mother, Thyroid Problems - Mother,Siblings, No family history of Hereditary Spherocytosis, Kidney  Disease, Lung Disease, Seizures, Stroke, Tuberculosis. Social History Former smoker - from teens to 1's, Marital Status - Married, Alcohol Use - Rarely - beer, Drug Use - No History, Caffeine Use - Daily - coffee. Medical History Hematologic/Lymphatic Patient has history of Anemia Endocrine Patient has history of Type I Diabetes - s Genitourinary Patient has history of End Stage Renal Disease - off dialysis since transplants Immunological Denies history of Lupus Erythematosus, Raynaudoos, Scleroderma Integumentary (Skin) Denies history of History of Burn Musculoskeletal Patient has history of Osteoarthritis, Osteomyelitis Neurologic Patient has history of Neuropathy Oncologic Patient has history of Received Radiation Denies history of Received Chemotherapy Hospitalization/Surgery History - infected wound (pressure) 2022. - (R) BKA amp Oct 2021. Objective Constitutional respirations regular, non-labored and within target range for patient.. Vitals Time Taken: 1:45 PM, Height: 53 in, Weight: 110 lbs, BMI: 27.5, Temperature: 98.0 F, Pulse: 83 bpm, Respiratory Rate: 16 breaths/min, Blood Pressure: 156/79 mmHg, Capillary Blood Glucose: 127 mg/dl. Psychiatric pleasant and cooperative. General Notes: Sacral region: Large open wound with granulation tissue present. Undermining circumferentially. No obvious signs of infection. Integumentary (Hair, Skin) Wound #1 status is Open. Original cause of wound was Pressure Injury. The date acquired was: 02/24/2021. The wound has been in treatment 7 weeks. The wound is located on the Sacrum. The wound measures 6cm length x 4.2cm width x 0.5cm depth; 19.792cm^2 area and 9.896cm^3 volume. There is Fat Layer (Subcutaneous Tissue) exposed. There is no tunneling noted, however, there is undermining starting at 12:00 and ending at 3:00 with a maximum distance of 2.7cm. There is a medium amount of serosanguineous drainage noted. The wound margin is well  defined and not attached to the wound base. There is large (67- 100%) pink granulation within the wound bed. There is no necrotic tissue within the wound bed. Assessment Active Problems ICD-10 Pressure ulcer of sacral region, stage 3 Type 1 diabetes mellitus without complications Acquired absence of right leg below knee Kidney transplant status Patient's wound shows improvement in size and appearance since last clinic visit with the use of wet-to-dry Dakin's dressings. She had a wound culture done that showed Streptococcus and Corynebacterium. She was started on amoxicillin and has completed the course. She also had imaging done. She had an MRI that showed small residual bone marrow edema remaining in the distal sacrum and coccyx likely reflecting mild residual osteomyelitis. I recommended following up with infectious disease if there is anything further that needs to be done. At this time she qualifies for HBO and she states she has done this in the past for another wound. She will talk this over with her husband and they will decide at next clinic visit if she would like to proceed. I  would like for her to follow-up in 2 weeks however she would like to follow-up in 4 weeks. We will see her then. Plan Follow-up Appointments: Return appointment in 1 month. - with Dr. Heber [place] Bathing/ Shower/ Hygiene: Do not shower or bathe in tub. Off-Loading: Turn and reposition every 2 hours Additional Orders / Instructions: Follow Nutritious Diet Home Health: No change in wound care orders this week; continue Home Health for wound care. May utilize formulary equivalent dressing for wound treatment orders unless otherwise specified. Dressing changes to be completed by [place] on Monday / Wednesday / Friday except when patient has scheduled visit at [facility]. Other Home Health Orders/Instructions: - [place] [facility] Therapy: Evaluate for HBO Therapy - Patient to think  about HBO treatment Consults ordered were: Infectious Disease - Chronic Osteomyelitis WOUND #1: - Sacrum Wound Laterality: Cleanser: Wound Cleanser (Home Health) 1 x Per Day/30 Days Discharge Instructions: Cleanse the wound with wound cleanser prior to applying a clean dressing using gauze sponges, not tissue or cotton balls. Prim Dressing: Dakin's Solution 0.125%, 16 (oz) (Home Health) 1 x Per Day/30 Days ary Discharge Instructions: Moisten gauze with Dakin's solution and pack lightly into wound Secondary Dressing: Woven Gauze Sponge, Non-Sterile 4x4 in (Home Health) 1 x Per Day/30 Days Discharge Instructions: Apply over primary dressing as directed. Secondary Dressing: Zetuvit Plus Silicone Border Dressing 7x7(in/in) (Home Health) 1 x Per Day/30 Days Discharge Instructions: Apply silicone border over primary dressing as directed. 1. Continue Dakin's wet-to-dry dressings 2. Aggressive offloading 3. Follow-up with infectious disease Electronic Signature(s) Signed: 08/10/2021 3:27:57 PM By: [name] DO Entered By: [name] on 08/10/2021 15:27:12 -------------------------------------------------------------------------------- HxROS Details Patient Name: Date of Service: [name], [name] 08/10/2021 1:30 PM Medical Record Number: [id number] Patient Account Number: [id number] Date of Birth/Sex: Treating RN: Jan 20, 1961 (60 y.o. [name] Primary Care Provider: Melissa Lopez Other Clinician: Referring Provider: Treating Provider/Extender: [name] in Treatment: 7 Information Obtained From Patient Hematologic/Lymphatic Medical History: Positive for: Anemia Endocrine Medical History: Positive for: Type I Diabetes - s Time with diabetes: 10 years Treated with: Insulin Blood sugar tested every day: Yes Tested : constant with device Genitourinary Medical History: Positive for: End Stage Renal Disease - off dialysis since  transplants Immunological Medical History: Negative for: Lupus Erythematosus; Raynauds; Scleroderma Integumentary (Skin) Medical History: Negative for: History of Burn Musculoskeletal Medical History: Positive for: Osteoarthritis; Osteomyelitis Neurologic Medical History: Positive for: Neuropathy Oncologic Medical History: Positive for: Received Radiation Negative for: Received Chemotherapy Immunizations Pneumococcal Vaccine: Received Pneumococcal Vaccination: Yes Received Pneumococcal Vaccination On or After 60th Birthday: No Immunization Notes: states being up to date on all - unknown dates for immuization Implantable Devices None Hospitalization / Surgery History Type of Hospitalization/Surgery infected wound (pressure) 2022 (R) BKA amp Oct 2021 Family and Social History Cancer: Yes - Father; Diabetes: Yes - Father; Heart Disease: Yes - Maternal Grandparents; Hereditary Spherocytosis: No; Hypertension: Yes - Mother; Kidney Disease: No; Lung Disease: No; Seizures: No; Stroke: No; Thyroid Problems: Yes - Mother,Siblings; Tuberculosis: No; Former smoker - from teens to 91's; Marital Status - Married; Alcohol Use: Rarely - beer; Drug Use: No History; Caffeine Use: Daily - coffee; Financial Concerns: No; Food, Clothing or Shelter Needs: No; Support System Lacking: No; Transportation Concerns: No Electronic Signature(s) Signed: 08/10/2021 3:27:57 PM By: [name] DO Signed: 08/10/2021 5:36:52 PM By: [name] Entered By: [name] on 08/10/2021 15:23:05 -------------------------------------------------------------------------------- SuperBill Details Patient Name: Date of Service: [name]  08/10/2021 Medical Record Number: [id number] Patient Account Number: [id number] Date of Birth/Sex: Treating RN: [birth year] (60 y.o. [name] Primary Care Provider: Melissa Lopez Other Clinician: Referring Provider: Treating Provider/Extender:  [name] [name]: 7 Diagnosis Coding ICD-10 Codes Code Description [phone] Pressure ulcer of sacral region, stage 3 E10.9 Type 1 diabetes mellitus without complications 0000000 Acquired absence of right leg below knee Z94.0 Kidney transplant status Facility Procedures CPT4 Code: [id number] Description: 99213 - WOUND CARE VISIT-LEV 3 EST PT Modifier: Quantity: 1 Physician Procedures : CPT4 Code Description Modifier [id number] - WC PHYS LEVEL 3 - EST PT ICD-10 Diagnosis Description L89.153 Pressure ulcer of sacral region, stage 3 E10.9 Type 1 diabetes mellitus without complications 0000000 Acquired absence of right leg below knee  Z94.0 Kidney transplant status Quantity: 1 Electronic Signature(s) Signed: 08/10/2021 3:27:57 PM By: [name] DO Entered By: [name] on 08/10/2021 15:27:29

## 2021-08-11 DIAGNOSIS — Z466 Encounter for fitting and adjustment of urinary device: Secondary | ICD-10-CM | POA: Diagnosis not present

## 2021-08-11 DIAGNOSIS — L89154 Pressure ulcer of sacral region, stage 4: Secondary | ICD-10-CM | POA: Diagnosis not present

## 2021-08-11 DIAGNOSIS — D631 Anemia in chronic kidney disease: Secondary | ICD-10-CM | POA: Diagnosis not present

## 2021-08-11 DIAGNOSIS — E785 Hyperlipidemia, unspecified: Secondary | ICD-10-CM | POA: Diagnosis not present

## 2021-08-11 DIAGNOSIS — K219 Gastro-esophageal reflux disease without esophagitis: Secondary | ICD-10-CM | POA: Diagnosis not present

## 2021-08-11 DIAGNOSIS — R339 Retention of urine, unspecified: Secondary | ICD-10-CM | POA: Diagnosis not present

## 2021-08-11 DIAGNOSIS — Z7982 Long term (current) use of aspirin: Secondary | ICD-10-CM | POA: Diagnosis not present

## 2021-08-11 DIAGNOSIS — I129 Hypertensive chronic kidney disease with stage 1 through stage 4 chronic kidney disease, or unspecified chronic kidney disease: Secondary | ICD-10-CM | POA: Diagnosis not present

## 2021-08-11 DIAGNOSIS — Z435 Encounter for attention to cystostomy: Secondary | ICD-10-CM | POA: Diagnosis not present

## 2021-08-11 DIAGNOSIS — E1022 Type 1 diabetes mellitus with diabetic chronic kidney disease: Secondary | ICD-10-CM | POA: Diagnosis not present

## 2021-08-11 DIAGNOSIS — N1831 Chronic kidney disease, stage 3a: Secondary | ICD-10-CM | POA: Diagnosis not present

## 2021-08-11 DIAGNOSIS — E1069 Type 1 diabetes mellitus with other specified complication: Secondary | ICD-10-CM | POA: Diagnosis not present

## 2021-08-11 DIAGNOSIS — Z7952 Long term (current) use of systemic steroids: Secondary | ICD-10-CM | POA: Diagnosis not present

## 2021-08-11 DIAGNOSIS — L89522 Pressure ulcer of left ankle, stage 2: Secondary | ICD-10-CM | POA: Diagnosis not present

## 2021-08-11 DIAGNOSIS — C519 Malignant neoplasm of vulva, unspecified: Secondary | ICD-10-CM | POA: Diagnosis not present

## 2021-08-11 DIAGNOSIS — F321 Major depressive disorder, single episode, moderate: Secondary | ICD-10-CM | POA: Diagnosis not present

## 2021-08-11 NOTE — Progress Notes (Signed)
[name], [name] ([id number]) Visit Report for 08/10/2021 Arrival Information Details Patient Name: Date of Service: [name], [name] 08/10/2021 1:30 PM Medical Record Number: [id number] Patient Account Number: [id number] Date of Birth/Sex: Treating RN: 1961/11/04 (60 y.o. [name] Primary Care Estreya Clay: [name] Other Clinician: Referring Debbie Bellucci: Treating Derel Mcglasson/Extender: [name] in Treatment: 7 Visit Information History Since Last Visit Added or deleted any medications: No Patient Arrived: Ambulatory Any new allergies or adverse reactions: No Arrival Time: 13:45 Had a fall or experienced change in No Accompanied By: husband activities of daily living that may affect Transfer Assistance: None risk of falls: Patient Identification Verified: Yes Signs or symptoms of abuse/neglect since last visito No Secondary Verification Process Completed: Yes Hospitalized since last visit: No Patient Requires Transmission-Based Precautions: No Implantable device outside of the clinic excluding No Patient Has Alerts: No cellular tissue based products placed in the center since last visit: Has Dressing in Place as Prescribed: Yes Pain Present Now: Yes Electronic Signature(s) Signed: 08/11/2021 11:32:43 AM By: Sandre Kitty Entered By: Sandre Kitty on 08/10/2021 13:45:59 -------------------------------------------------------------------------------- Clinic Level of Care Assessment Details Patient Name: Date of Service: [name], [name] 08/10/2021 1:30 PM Medical Record Number: [id number] Patient Account Number: [id number] Date of Birth/Sex: Treating RN: 30-May-1961 (60 y.o. [name] Primary Care Rayne Loiseau: [name] Other Clinician: Referring Anusha Claus: Treating Avery Klingbeil/Extender: [name] in Treatment: 7 Clinic Level of Care Assessment Items TOOL 4 Quantity Score X- 1 0 Use when only  an EandM is performed on FOLLOW-UP visit ASSESSMENTS - Nursing Assessment / Reassessment X- 1 10 Reassessment of Co-morbidities (includes updates in patient status) X- 1 5 Reassessment of Adherence to Treatment Plan ASSESSMENTS - Wound and Skin A ssessment / Reassessment X - Simple Wound Assessment / Reassessment - one wound 1 5 '[]'$  - 0 Complex Wound Assessment / Reassessment - multiple wounds '[]'$  - 0 Dermatologic / Skin Assessment (not related to wound area) ASSESSMENTS - Focused Assessment '[]'$  - 0 Circumferential Edema Measurements - multi extremities '[]'$  - 0 Nutritional Assessment / Counseling / Intervention '[]'$  - 0 Lower Extremity Assessment (monofilament, tuning fork, pulses) '[]'$  - 0 Peripheral Arterial Disease Assessment (using hand held doppler) ASSESSMENTS - Ostomy and/or Continence Assessment and Care '[]'$  - 0 Incontinence Assessment and Management '[]'$  - 0 Ostomy Care Assessment and Management (repouching, etc.) PROCESS - Coordination of Care '[]'$  - 0 Simple Patient / Family Education for ongoing care X- 1 20 Complex (extensive) Patient / Family Education for ongoing care X- 1 10 Staff obtains Programmer, systems, Records, T Results / Process Orders est X- 1 10 Staff telephones HHA, Nursing Homes / Clarify orders / etc '[]'$  - 0 Routine Transfer to another Facility (non-emergent condition) '[]'$  - 0 Routine Hospital Admission (non-emergent condition) '[]'$  - 0 New Admissions / Biomedical engineer / Ordering NPWT Apligraf, etc. , '[]'$  - 0 Emergency Hospital Admission (emergent condition) '[]'$  - 0 Simple Discharge Coordination '[]'$  - 0 Complex (extensive) Discharge Coordination PROCESS - Special Needs '[]'$  - 0 Pediatric / Minor Patient Management '[]'$  - 0 Isolation Patient Management '[]'$  - 0 Hearing / Language / Visual special needs '[]'$  - 0 Assessment of Community assistance (transportation, D/C planning, etc.) '[]'$  - 0 Additional assistance / Altered mentation '[]'$  - 0 Support Surface(s)  Assessment (bed, cushion, seat, etc.) INTERVENTIONS - Wound Cleansing / Measurement X - Simple Wound Cleansing - one wound 1 5 '[]'$  - 0 Complex Wound Cleansing - multiple wounds X- 1 5 Wound Imaging (photographs -  any number of wounds) '[]'$  - 0 Wound Tracing (instead of photographs) X- 1 5 Simple Wound Measurement - one wound '[]'$  - 0 Complex Wound Measurement - multiple wounds INTERVENTIONS - Wound Dressings '[]'$  - 0 Small Wound Dressing one or multiple wounds X- 1 15 Medium Wound Dressing one or multiple wounds '[]'$  - 0 Large Wound Dressing one or multiple wounds '[]'$  - 0 Application of Medications - topical '[]'$  - 0 Application of Medications - injection INTERVENTIONS - Miscellaneous '[]'$  - 0 External ear exam '[]'$  - 0 Specimen Collection (cultures, biopsies, blood, body fluids, etc.) '[]'$  - 0 Specimen(s) / Culture(s) sent or taken to Lab for analysis '[]'$  - 0 Patient Transfer (multiple staff / Civil Service fast streamer / Similar devices) '[]'$  - 0 Simple Staple / Suture removal (25 or less) '[]'$  - 0 Complex Staple / Suture removal (26 or more) '[]'$  - 0 Hypo / Hyperglycemic Management (close monitor of Blood Glucose) '[]'$  - 0 Ankle / Brachial Index (ABI) - do not check if billed separately X- 1 5 Vital Signs Has the patient been seen at the hospital within the last three years: Yes Total Score: 95 Level Of Care: New/Established - Level 3 Electronic Signature(s) Signed: 08/10/2021 5:36:52 PM By: [name] Entered By: [name] on 08/10/2021 14:48:00 -------------------------------------------------------------------------------- Encounter Discharge Information Details Patient Name: Date of Service: [name], [name] 08/10/2021 1:30 PM Medical Record Number: [id number] Patient Account Number: [id number] Date of Birth/Sex: Treating RN: Sep 29, 1961 (60 y.o. [name], [name] Primary Care Kenyan Karnes: [name] Other Clinician: Referring Courtlyn Aki: Treating Amely Voorheis/Extender: [name] in Treatment: 7 Encounter Discharge Information Items Discharge Condition: Stable Ambulatory Status: Wheelchair Discharge Destination: Home Transportation: Private Auto Accompanied By: self Schedule Follow-up Appointment: Yes Clinical Summary of Care: Patient Declined Electronic Signature(s) Signed: 08/11/2021 5:16:57 PM By: Rhae Hammock RN Entered By: Rhae Hammock on 08/10/2021 16:25:09 -------------------------------------------------------------------------------- Lower Extremity Assessment Details Patient Name: Date of Service: [name], [name] 08/10/2021 1:30 PM Medical Record Number: [id number] Patient Account Number: [id number] Date of Birth/Sex: Treating RN: 05-27-1961 (60 y.o. [name] Primary Care Tiras Bianchini: [name] Other Clinician: Referring Nicanor Mendolia: Treating Creston Klas/Extender: [name]: 7 Electronic Signature(s) Signed: 08/10/2021 5:38:02 PM By: [name] Entered By: [name] on 08/10/2021 14:28:31 -------------------------------------------------------------------------------- Multi Wound Chart Details Patient Name: Date of Service: [name], [name] 08/10/2021 1:30 PM Medical Record Number: [id number] Patient Account Number: [id number] Date of Birth/Sex: Treating RN: Nov 27, 1961 (60 y.o. [name] Primary Care Renji Berwick: [name] Other Clinician: Referring Romulus Hanrahan: Treating Dawnna Gritz/Extender: [name]: 7 Vital Signs Height(in): 53 Capillary Blood Glucose(mg/dl): 127 Weight(lbs): 110 Pulse(bpm): 61 Body Mass Index(BMI): 28 Blood Pressure(mmHg): 156/79 Temperature(F): 98.0 Respiratory Rate(breaths/min): 16 Photos: [N/A:N/A] Sacrum N/A N/A Wound Location: Pressure Injury N/A N/A Wounding Event: Pressure Ulcer N/A N/A Primary Etiology: Anemia, Type I Diabetes, End Stage N/A  N/A Comorbid History: Renal Disease, Osteoarthritis, Osteomyelitis, Neuropathy, Received Radiation 02/24/2021 N/A N/A Date Acquired: 7 N/A N/A Weeks of Treatment: Open N/A N/A Wound Status: 6x4.2x0.5 N/A N/A Measurements L x W x D (cm) 19.792 N/A N/A A (cm) : rea 9.896 N/A N/A Volume (cm) : 19.70% N/A N/A % Reduction in A rea: 73.20% N/A N/A % Reduction in Volume: 12 Starting Position 1 (o'clock): 3 Ending Position 1 (o'clock): 2.7 Maximum Distance 1 (cm): Yes N/A N/A Undermining: Category/Stage III N/A N/A Classification: Medium N/A N/A Exudate A mount: Serosanguineous N/A N/A Exudate Type: red, brown N/A N/A Exudate  Color: Well defined, not attached N/A N/A Wound Margin: Large (67-100%) N/A N/A Granulation A mount: Pink N/A N/A Granulation Quality: None Present (0%) N/A N/A Necrotic A mount: Fat Layer (Subcutaneous Tissue): Yes N/A N/A Exposed Structures: Fascia: No Tendon: No Muscle: No Joint: No Bone: No Small (1-33%) N/A N/A Epithelialization: Treatment Notes Electronic Signature(s) Signed: 08/10/2021 3:27:57 PM By: [name] DO Signed: 08/10/2021 5:36:52 PM By: [name] Entered By: [name] on 08/10/2021 15:21:29 -------------------------------------------------------------------------------- Multi-Disciplinary Care Plan Details Patient Name: Date of Service: [name], [name] 08/10/2021 1:30 PM Medical Record Number: [id number] Patient Account Number: [id number] Date of Birth/Sex: Treating RN: April 01, 1961 (60 y.o. [name] Primary Care Mariyah Upshaw: [name] Other Clinician: Referring Graciana Sessa: Treating Liliya Fullenwider/Extender: [name]: 7 Active Inactive Wound/Skin Impairment Nursing Diagnoses: Impaired tissue integrity Knowledge deficit related to ulceration/compromised skin integrity Goals: Patient/caregiver will verbalize understanding of skin care  regimen Date Initiated: 06/19/2021 Target Resolution Date: 08/28/2021 Goal Status: Active Ulcer/skin breakdown will have a volume reduction of 30% by week 4 Date Initiated: 06/19/2021 Date Inactivated: 07/27/2021 Target Resolution Date: 07/02/2021 Goal Status: Unmet Unmet Reason: pressure relief Interventions: Assess patient/caregiver ability to obtain necessary supplies Assess patient/caregiver ability to perform ulcer/skin care regimen upon admission and as needed Assess ulceration(s) every visit Provide education on ulcer and skin care Notes: Electronic Signature(s) Signed: 08/10/2021 1:55:02 PM By: [name] Entered By: [name] on 08/10/2021 13:55:02 -------------------------------------------------------------------------------- Pain Assessment Details Patient Name: Date of Service: [name], [name] 08/10/2021 1:30 PM Medical Record Number: [id number] Patient Account Number: [id number] Date of Birth/Sex: Treating RN: [birth year] (60 y.o. [name] Primary Care Tedric Leeth: [name] Other Clinician: Referring Abby Stines: Treating Marshon Bangs/Extender: [name]: 7 Active Problems Location of Pain Severity and Description of Pain Patient Has Paino No Site Locations Pain Management and Medication Current Pain Management: Electronic Signature(s) Signed: 08/10/2021 5:36:52 PM By: [name] Signed: 08/11/2021 11:32:43 AM By: Sandre Kitty Entered By: Sandre Kitty on 08/10/2021 13:46:27 -------------------------------------------------------------------------------- Patient/Caregiver Education Details Patient Name: Date of Service: [name], [name] 8/15/2022andnbsp1:30 PM Medical Record Number: [id number] Patient Account Number: [id number] Date of Birth/Gender: Treating RN: [birth year] (60 y.o. [name] Primary Care Physician: [name] Other Clinician: Referring Physician: Treating  Physician/Extender: [name] in Treatment: 7 Education Assessment Education Provided To: Patient Education Topics Provided Pressure: Methods: Explain/Verbal, Printed Responses: State content correctly Wound/Skin Impairment: Methods: Explain/Verbal, Printed Responses: State content correctly Electronic Signature(s) Signed: 08/10/2021 5:36:52 PM By: [name] Entered By: [name] on 08/10/2021 13:55:27 -------------------------------------------------------------------------------- Wound Assessment Details Patient Name: Date of Service: [name], [name] 08/10/2021 1:30 PM Medical Record Number: [id number] Patient Account Number: [id number] Date of Birth/Sex: Treating RN: [birth year] (60 y.o. [name] Primary Care Nalanie Winiecki: [name] Other Clinician: Referring Malaney Mcbean: Treating Cheyane Ayon/Extender: [name]: 7 Wound Status Wound Number: 1 Primary Pressure Ulcer Etiology: Wound Location: Sacrum Wound Open Wounding Event: Pressure Injury Status: Date Acquired: 02/24/2021 Comorbid Anemia, Type I Diabetes, End Stage Renal Disease, Osteoarthritis, Weeks Of Treatment: 7 History: Osteomyelitis, Neuropathy, Received Radiation Clustered Wound: No Photos Wound Measurements Length: (cm) 6 Width: (cm) 4.2 Depth: (cm) 0.5 Area: (cm) 19.792 Volume: (cm) 9.896 % Reduction in Area: 19.7% % Reduction in Volume: 73.2% Epithelialization: Small (1-33%) Tunneling: No Undermining: Yes Starting Position (o'clock): 12 Ending Position (o'clock): 3 Maximum Distance: (cm) 2.7 Wound Description Classification: Category/Stage III Wound Margin: Well defined, not attached Exudate Amount: Medium Exudate  Type: Serosanguineous Exudate Color: red, brown Foul Odor After Cleansing: No Slough/Fibrino No Wound Bed Granulation Amount: Large (67-100%) Exposed Structure Granulation Quality:  Pink Fascia Exposed: No Necrotic Amount: None Present (0%) Fat Layer (Subcutaneous Tissue) Exposed: Yes Tendon Exposed: No Muscle Exposed: No Joint Exposed: No Bone Exposed: No Treatment Notes Wound #1 (Sacrum) Cleanser Wound Cleanser Discharge Instruction: Cleanse the wound with wound cleanser prior to applying a clean dressing using gauze sponges, not tissue or cotton balls. Peri-Wound Care Topical Primary Dressing Dakin's Solution 0.125%, 16 (oz) Discharge Instruction: Moisten gauze with Dakin's solution and pack lightly into wound Secondary Dressing Woven Gauze Sponge, Non-Sterile 4x4 in Discharge Instruction: Apply over primary dressing as directed. Zetuvit Plus Silicone Border Dressing 7x7(in/in) Discharge Instruction: Apply silicone border over primary dressing as directed. Secured With Compression Wrap Compression Stockings Environmental education officer) Signed: 08/10/2021 5:36:52 PM By: [name] Signed: 08/10/2021 5:38:02 PM By: [name] Entered By: [name] on 08/10/2021 14:28:57 -------------------------------------------------------------------------------- Vitals Details Patient Name: Date of Service: [name], [name] 08/10/2021 1:30 PM Medical Record Number: [id number] Patient Account Number: [id number] Date of Birth/Sex: Treating RN: 1961-03-02 (60 y.o. [name] Primary Care Bryann Mcnealy: [name] Other Clinician: Referring Audine Mangione: Treating Treyten Monestime/Extender: [name]: 7 Vital Signs Time Taken: 13:45 Temperature (F): 98.0 Height (in): 53 Pulse (bpm): 83 Weight (lbs): 110 Respiratory Rate (breaths/min): 16 Body Mass Index (BMI): 27.5 Blood Pressure (mmHg): 156/79 Capillary Blood Glucose (mg/dl): 127 Reference Range: 80 - 120 mg / dl Electronic Signature(s) Signed: 08/11/2021 11:32:43 AM By: Sandre Kitty Entered By: Sandre Kitty on 08/10/2021 13:46:21

## 2021-08-12 DIAGNOSIS — L89522 Pressure ulcer of left ankle, stage 2: Secondary | ICD-10-CM | POA: Diagnosis not present

## 2021-08-12 DIAGNOSIS — L89154 Pressure ulcer of sacral region, stage 4: Secondary | ICD-10-CM | POA: Diagnosis not present

## 2021-08-12 DIAGNOSIS — Z466 Encounter for fitting and adjustment of urinary device: Secondary | ICD-10-CM | POA: Diagnosis not present

## 2021-08-12 DIAGNOSIS — Z7982 Long term (current) use of aspirin: Secondary | ICD-10-CM | POA: Diagnosis not present

## 2021-08-12 DIAGNOSIS — Z7952 Long term (current) use of systemic steroids: Secondary | ICD-10-CM | POA: Diagnosis not present

## 2021-08-12 DIAGNOSIS — Z435 Encounter for attention to cystostomy: Secondary | ICD-10-CM | POA: Diagnosis not present

## 2021-08-12 DIAGNOSIS — R339 Retention of urine, unspecified: Secondary | ICD-10-CM | POA: Diagnosis not present

## 2021-08-12 DIAGNOSIS — F321 Major depressive disorder, single episode, moderate: Secondary | ICD-10-CM | POA: Diagnosis not present

## 2021-08-12 DIAGNOSIS — E785 Hyperlipidemia, unspecified: Secondary | ICD-10-CM | POA: Diagnosis not present

## 2021-08-12 DIAGNOSIS — D631 Anemia in chronic kidney disease: Secondary | ICD-10-CM | POA: Diagnosis not present

## 2021-08-12 DIAGNOSIS — E1069 Type 1 diabetes mellitus with other specified complication: Secondary | ICD-10-CM | POA: Diagnosis not present

## 2021-08-12 DIAGNOSIS — I129 Hypertensive chronic kidney disease with stage 1 through stage 4 chronic kidney disease, or unspecified chronic kidney disease: Secondary | ICD-10-CM | POA: Diagnosis not present

## 2021-08-12 DIAGNOSIS — K219 Gastro-esophageal reflux disease without esophagitis: Secondary | ICD-10-CM | POA: Diagnosis not present

## 2021-08-12 DIAGNOSIS — C519 Malignant neoplasm of vulva, unspecified: Secondary | ICD-10-CM | POA: Diagnosis not present

## 2021-08-12 DIAGNOSIS — E1022 Type 1 diabetes mellitus with diabetic chronic kidney disease: Secondary | ICD-10-CM | POA: Diagnosis not present

## 2021-08-12 DIAGNOSIS — N1831 Chronic kidney disease, stage 3a: Secondary | ICD-10-CM | POA: Diagnosis not present

## 2021-08-13 DIAGNOSIS — L89154 Pressure ulcer of sacral region, stage 4: Secondary | ICD-10-CM | POA: Diagnosis not present

## 2021-08-14 DIAGNOSIS — L89154 Pressure ulcer of sacral region, stage 4: Secondary | ICD-10-CM | POA: Diagnosis not present

## 2021-08-15 DIAGNOSIS — L89154 Pressure ulcer of sacral region, stage 4: Secondary | ICD-10-CM | POA: Diagnosis not present

## 2021-08-16 DIAGNOSIS — L89154 Pressure ulcer of sacral region, stage 4: Secondary | ICD-10-CM | POA: Diagnosis not present

## 2021-08-17 DIAGNOSIS — E1069 Type 1 diabetes mellitus with other specified complication: Secondary | ICD-10-CM | POA: Diagnosis not present

## 2021-08-17 DIAGNOSIS — D631 Anemia in chronic kidney disease: Secondary | ICD-10-CM | POA: Diagnosis not present

## 2021-08-17 DIAGNOSIS — Z435 Encounter for attention to cystostomy: Secondary | ICD-10-CM | POA: Diagnosis not present

## 2021-08-17 DIAGNOSIS — K219 Gastro-esophageal reflux disease without esophagitis: Secondary | ICD-10-CM | POA: Diagnosis not present

## 2021-08-17 DIAGNOSIS — Z7952 Long term (current) use of systemic steroids: Secondary | ICD-10-CM | POA: Diagnosis not present

## 2021-08-17 DIAGNOSIS — Z7982 Long term (current) use of aspirin: Secondary | ICD-10-CM | POA: Diagnosis not present

## 2021-08-17 DIAGNOSIS — L89522 Pressure ulcer of left ankle, stage 2: Secondary | ICD-10-CM | POA: Diagnosis not present

## 2021-08-17 DIAGNOSIS — F321 Major depressive disorder, single episode, moderate: Secondary | ICD-10-CM | POA: Diagnosis not present

## 2021-08-17 DIAGNOSIS — N1831 Chronic kidney disease, stage 3a: Secondary | ICD-10-CM | POA: Diagnosis not present

## 2021-08-17 DIAGNOSIS — R339 Retention of urine, unspecified: Secondary | ICD-10-CM | POA: Diagnosis not present

## 2021-08-17 DIAGNOSIS — Z466 Encounter for fitting and adjustment of urinary device: Secondary | ICD-10-CM | POA: Diagnosis not present

## 2021-08-17 DIAGNOSIS — L89154 Pressure ulcer of sacral region, stage 4: Secondary | ICD-10-CM | POA: Diagnosis not present

## 2021-08-17 DIAGNOSIS — E1022 Type 1 diabetes mellitus with diabetic chronic kidney disease: Secondary | ICD-10-CM | POA: Diagnosis not present

## 2021-08-17 DIAGNOSIS — E785 Hyperlipidemia, unspecified: Secondary | ICD-10-CM | POA: Diagnosis not present

## 2021-08-17 DIAGNOSIS — I129 Hypertensive chronic kidney disease with stage 1 through stage 4 chronic kidney disease, or unspecified chronic kidney disease: Secondary | ICD-10-CM | POA: Diagnosis not present

## 2021-08-17 DIAGNOSIS — C519 Malignant neoplasm of vulva, unspecified: Secondary | ICD-10-CM | POA: Diagnosis not present

## 2021-08-18 DIAGNOSIS — R339 Retention of urine, unspecified: Secondary | ICD-10-CM | POA: Diagnosis not present

## 2021-08-20 ENCOUNTER — Ambulatory Visit: Payer: BC Managed Care – PPO | Admitting: Infectious Disease

## 2021-08-21 DIAGNOSIS — Z7982 Long term (current) use of aspirin: Secondary | ICD-10-CM | POA: Diagnosis not present

## 2021-08-21 DIAGNOSIS — E1069 Type 1 diabetes mellitus with other specified complication: Secondary | ICD-10-CM | POA: Diagnosis not present

## 2021-08-21 DIAGNOSIS — Z466 Encounter for fitting and adjustment of urinary device: Secondary | ICD-10-CM | POA: Diagnosis not present

## 2021-08-21 DIAGNOSIS — K219 Gastro-esophageal reflux disease without esophagitis: Secondary | ICD-10-CM | POA: Diagnosis not present

## 2021-08-21 DIAGNOSIS — E785 Hyperlipidemia, unspecified: Secondary | ICD-10-CM | POA: Diagnosis not present

## 2021-08-21 DIAGNOSIS — I129 Hypertensive chronic kidney disease with stage 1 through stage 4 chronic kidney disease, or unspecified chronic kidney disease: Secondary | ICD-10-CM | POA: Diagnosis not present

## 2021-08-21 DIAGNOSIS — E1022 Type 1 diabetes mellitus with diabetic chronic kidney disease: Secondary | ICD-10-CM | POA: Diagnosis not present

## 2021-08-21 DIAGNOSIS — F321 Major depressive disorder, single episode, moderate: Secondary | ICD-10-CM | POA: Diagnosis not present

## 2021-08-21 DIAGNOSIS — R339 Retention of urine, unspecified: Secondary | ICD-10-CM | POA: Diagnosis not present

## 2021-08-21 DIAGNOSIS — Z7952 Long term (current) use of systemic steroids: Secondary | ICD-10-CM | POA: Diagnosis not present

## 2021-08-21 DIAGNOSIS — D631 Anemia in chronic kidney disease: Secondary | ICD-10-CM | POA: Diagnosis not present

## 2021-08-21 DIAGNOSIS — C519 Malignant neoplasm of vulva, unspecified: Secondary | ICD-10-CM | POA: Diagnosis not present

## 2021-08-21 DIAGNOSIS — Z435 Encounter for attention to cystostomy: Secondary | ICD-10-CM | POA: Diagnosis not present

## 2021-08-21 DIAGNOSIS — L89522 Pressure ulcer of left ankle, stage 2: Secondary | ICD-10-CM | POA: Diagnosis not present

## 2021-08-21 DIAGNOSIS — L89154 Pressure ulcer of sacral region, stage 4: Secondary | ICD-10-CM | POA: Diagnosis not present

## 2021-08-21 DIAGNOSIS — N1831 Chronic kidney disease, stage 3a: Secondary | ICD-10-CM | POA: Diagnosis not present

## 2021-08-25 ENCOUNTER — Ambulatory Visit (INDEPENDENT_AMBULATORY_CARE_PROVIDER_SITE_OTHER): Payer: BC Managed Care – PPO | Admitting: Infectious Disease

## 2021-08-25 ENCOUNTER — Encounter: Payer: Self-pay | Admitting: Infectious Disease

## 2021-08-25 ENCOUNTER — Other Ambulatory Visit: Payer: Self-pay

## 2021-08-25 VITALS — BP 120/70 | HR 84 | Temp 98.2°F

## 2021-08-25 DIAGNOSIS — Z940 Kidney transplant status: Secondary | ICD-10-CM | POA: Diagnosis not present

## 2021-08-25 DIAGNOSIS — E1069 Type 1 diabetes mellitus with other specified complication: Secondary | ICD-10-CM | POA: Diagnosis not present

## 2021-08-25 DIAGNOSIS — M8668 Other chronic osteomyelitis, other site: Secondary | ICD-10-CM | POA: Diagnosis not present

## 2021-08-25 DIAGNOSIS — N1831 Chronic kidney disease, stage 3a: Secondary | ICD-10-CM

## 2021-08-25 DIAGNOSIS — M86271 Subacute osteomyelitis, right ankle and foot: Secondary | ICD-10-CM

## 2021-08-25 DIAGNOSIS — E785 Hyperlipidemia, unspecified: Secondary | ICD-10-CM

## 2021-08-25 HISTORY — DX: Other chronic osteomyelitis, other site: M86.68

## 2021-08-25 MED ORDER — AMOXICILLIN 500 MG PO CAPS: 500.0000 mg | ORAL_CAPSULE | Freq: Three times a day (TID) | ORAL | 0 refills | Status: AC

## 2021-08-25 NOTE — Progress Notes (Signed)
Subjective:  Chief complaint follow-up for sacral decubitus ulcer where she had osteomyelitis  Patient ID: [name], female    DOB: [birth year], 60 y.o.   MRN: [id number]  HPI  [name] is a 60 year old Caucasian female with diabetes mellitus prior end-stage renal disease status post renal and pancreatic transplant at the [facility] followed by 2 other renal transplantation's, who had osteomyelitis involving the foot underwent right-sided below the knee amputation.  She became quite debilitated in the context of her severe diabetic foot infection that required BKA.  She has been residing in skilled nursing facility and developed a soft tissue infection of the sacral area which then worsened and eventually lead to exposed bone.    She was seen by my partner Dr. Gale Journey in January.  Neurosurgery and orthopedics also saw her but did not want to perform sacral bone biopsy.  She was given an empiric regimen of ceftriaxone 2 g daily along with 100 mg of doxycycline twice daily through February 17.  She was not seen by [place] in follow-up since then but is been followed closely by wound care.  Her wound had continued to improve but early in August apparently fell smelling drainage and cultures were done from the wound which yielded a group G Streptococcus along with corynebacterium.  She was given a 7-day course of amoxicillin and also given Dakin solution to apply to the wound.  Repeat MRI was formed August 06, 2021 which showed her decubitus ulcer with improving bone marrow edema in the sacrum with small residual edema in the distal sacrum and coccyx.   Referred back to [place] infectious disease for consideration whether she needed further antimicrobial therapy given the radiographic findings of residual osteomyelitis.  She still has pain at the site but is being able to offload it much more than before and is residing at home with her husband who accompanied her to the clinic today.  They also have a medical  mattress at home.  She says her most recent hemoglobin A1c was around 7.   Past Medical History:  Diagnosis Date   Anemia of chronic renal failure    Chronic osteomyelitis of sacrum ([place]) 08/25/2021   DDD (degenerative disc disease), lumbar    Diabetes mellitus without complication ([place])    type 1   Gait disorder    neurogenic   Hypertension    Hypothyroid 12/08/2020   Neuropathy    Renal disease    Sacral decubitus ulcer, stage IV ([place]) 12/08/2020    Past Surgical History:  Procedure Laterality Date   AMPUTATION Right 10/01/2020   Procedure: RIGHT BELOW KNEE AMPUTATION;  Surgeon: Newt Minion, MD;  Location: [facility];  Service: Orthopedics;  Laterality: Right;   BLADDER SURGERY     ESOPHAGOGASTRODUODENOSCOPY (EGD) WITH PROPOFOL N/A 11/10/2020   Procedure: ESOPHAGOGASTRODUODENOSCOPY (EGD) WITH PROPOFOL;  Surgeon: Irene Shipper, MD;  Location: [facility];  Service: Endoscopy;  Laterality: N/A;   IR GASTROSTOMY TUBE REMOVAL  03/25/2021   KIDNEY TRANSPLANT     LEG SURGERY     tib-fib fx , ORIF    Family History  Problem Relation Age of Onset   Hypertension Mother    Hypertension Father       Social History   Socioeconomic History   Marital status: Married    Spouse name: Not on file   Number of children: Not on file   Years of education: Not on file   Highest education level: Not on file  Occupational  History   Not on file  Tobacco Use   Smoking status: Never   Smokeless tobacco: Never  Vaping Use   Vaping Use: Never used  Substance and Sexual Activity   Alcohol use: Never   Drug use: Never   Sexual activity: Not Currently    Birth control/protection: None  Other Topics Concern   Not on file  Social History Narrative   Not on file   Social Determinants of Health   Financial Resource Strain: Not on file  Food Insecurity: Not on file  Transportation Needs: Not on file  Physical Activity: Not on file  Stress: Not on file  Social Connections: Not on  file    No Known Allergies   Current Outpatient Medications:    acetaminophen (TYLENOL) 325 MG tablet, Take 2 tablets (650 mg total) by mouth every 6 (six) hours as needed for mild pain (or Fever >/= 101). (Patient taking differently: Take 650 mg by mouth every 6 (six) hours as needed for mild pain or fever.), Disp: , Rfl:    amoxicillin (AMOXIL) 500 MG capsule, Take 1 capsule (500 mg total) by mouth 3 (three) times daily., Disp: 126 capsule, Rfl: 0   aspirin EC 81 MG tablet, Take 81 mg by mouth daily. Swallow whole., Disp: , Rfl:    atorvastatin (LIPITOR) 10 MG tablet, Take 1 tablet (10 mg total) by mouth daily., Disp: 30 tablet, Rfl: 2   azaTHIOprine (IMURAN) 50 MG tablet, Take 1 tablet (50 mg total) by mouth daily., Disp: 30 tablet, Rfl: 2   B Complex-C-Folic Acid TABS, Take 1 tablet by mouth daily., Disp: , Rfl:    collagenase (SANTYL) ointment, Apply topically daily., Disp: 15 g, Rfl: 2   cycloSPORINE (SANDIMMUNE) 25 MG capsule, Take 3 capsules (75 mg total) by mouth 2 (two) times daily., Disp: 90 capsule, Rfl: 2   insulin glargine (LANTUS) 100 UNIT/ML injection, Inject 0.05 mLs (5 Units total) into the skin at bedtime., Disp: 10 mL, Rfl: 11   insulin lispro (HUMALOG) 100 UNIT/ML injection, Inject 0.02 mLs (2 Units total) into the skin 3 (three) times daily before meals. Sliding scale if 200-250=2 units;251-300=4units;301-350=6 units;351-400=8 units;104-450=10 units;451-500=12 units subcutaneously before meals and at bedtime for DM, Disp: 10 mL, Rfl: 11   levothyroxine (SYNTHROID) 88 MCG tablet, Take 1 tablet (88 mcg total) by mouth daily before breakfast., Disp: 30 tablet, Rfl: 2   loperamide (IMODIUM) 2 MG capsule, Take 1 capsule (2 mg total) by mouth as needed for diarrhea or loose stools., Disp: 30 capsule, Rfl: 0   ondansetron (ZOFRAN-ODT) 4 MG disintegrating tablet, Take 1 tablet (4 mg total) by mouth every 4 (four) hours as needed for nausea or vomiting., Disp: 20 tablet, Rfl: 2    oxyCODONE (OXY IR/ROXICODONE) 5 MG immediate release tablet, Take 1 tablet (5 mg total) by mouth every 6 (six) hours as needed for severe pain., Disp: 30 tablet, Rfl: 0   pantoprazole (PROTONIX) 40 MG tablet, Take 1 tablet (40 mg total) by mouth daily., Disp: , Rfl:    predniSONE (DELTASONE) 5 MG tablet, Take 1 tablet (5 mg total) by mouth daily with breakfast., Disp: 30 tablet, Rfl: 2   sertraline (ZOLOFT) 25 MG tablet, Take 1 tablet (25 mg total) by mouth daily., Disp: 30 tablet, Rfl: 2   carbamide peroxide (DEBROX) 6.5 % OTIC solution, Place 5 drops into the right ear 2 (two) times daily. (Patient not taking: Reported on 08/25/2021), Disp: 15 mL, Rfl: 0   cholestyramine (QUESTRAN)  4 g packet, Take 1 packet (4 g total) by mouth 2 (two) times daily. (Patient not taking: Reported on 08/25/2021), Disp: 60 each, Rfl: 12   ciprofloxacin (CIPRO) 500 MG tablet, Take 1 tablet (500 mg total) by mouth 2 (two) times daily. (Patient not taking: Reported on 08/25/2021), Disp: 10 tablet, Rfl: 0   liver oil-zinc oxide (DESITIN) 40 % ointment, Apply topically as needed for irritation. (Patient not taking: Reported on 08/25/2021), Disp: 56.7 g, Rfl: 0   saccharomyces boulardii (FLORASTOR) 250 MG capsule, Take 1 capsule (250 mg total) by mouth 2 (two) times daily. (Patient not taking: Reported on 08/25/2021), Disp: 60 capsule, Rfl: 2   triamcinolone (NASACORT) 55 MCG/ACT AERO nasal inhaler, USE 1 SPRAY IN EACH NOSTRIL (Patient not taking: Reported on 08/25/2021), Disp: 16.9 each, Rfl: 6   Review of Systems  Constitutional:  Negative for activity change, appetite change, chills, diaphoresis, fatigue, fever and unexpected weight change.  HENT:  Negative for congestion, rhinorrhea, sinus pressure, sneezing, sore throat and trouble swallowing.   Eyes:  Negative for photophobia and visual disturbance.  Respiratory:  Negative for cough, chest tightness, shortness of breath, wheezing and stridor.   Cardiovascular:  Negative for  chest pain, palpitations and leg swelling.  Gastrointestinal:  Negative for abdominal distention, abdominal pain, anal bleeding, blood in stool, constipation, diarrhea, nausea and vomiting.  Genitourinary:  Negative for difficulty urinating, dysuria, flank pain and hematuria.  Musculoskeletal:  Negative for arthralgias, back pain, gait problem, joint swelling and myalgias.  Skin:  Positive for wound. Negative for color change, pallor and rash.  Neurological:  Negative for dizziness, tremors, weakness and light-headedness.  Hematological:  Negative for adenopathy. Does not bruise/bleed easily.  Psychiatric/Behavioral:  Negative for agitation, behavioral problems, confusion, decreased concentration, dysphoric mood and sleep disturbance.       Objective:   Physical Exam Constitutional:      General: She is not in acute distress.    Appearance: Normal appearance. She is well-developed. She is not ill-appearing or diaphoretic.  HENT:     Head: Normocephalic and atraumatic.     Right Ear: Hearing and external ear normal.     Left Ear: Hearing and external ear normal.     Nose: No nasal deformity or rhinorrhea.  Eyes:     General: No scleral icterus.    Conjunctiva/sclera: Conjunctivae normal.     Right eye: Right conjunctiva is not injected.     Left eye: Left conjunctiva is not injected.     Pupils: Pupils are equal, round, and reactive to light.  Neck:     Vascular: No JVD.  Cardiovascular:     Rate and Rhythm: Normal rate and regular rhythm.     Heart sounds: S1 normal and S2 normal.    No friction rub.  Pulmonary:     Effort: Pulmonary effort is normal. No respiratory distress.     Breath sounds: No wheezing.  Abdominal:     General: There is no distension.     Palpations: Abdomen is soft.     Tenderness: There is no abdominal tenderness.  Musculoskeletal:        General: Normal range of motion.     Right shoulder: Normal.     Left shoulder: Normal.     Cervical back: Normal  range of motion and neck supple.     Right hip: Normal.     Left hip: Normal.     Right knee: Normal.     Left knee: Normal.  Lymphadenopathy:     Head:     Right side of head: No submandibular, preauricular or posterior auricular adenopathy.     Left side of head: No submandibular, preauricular or posterior auricular adenopathy.     Cervical: No cervical adenopathy.     Right cervical: No superficial or deep cervical adenopathy.    Left cervical: No superficial or deep cervical adenopathy.  Skin:    General: Skin is warm and dry.     Coloration: Skin is not pale.     Findings: No abrasion, bruising, ecchymosis, erythema, lesion or rash.     Nails: There is no clubbing.  Neurological:     General: No focal deficit present.     Mental Status: She is alert and oriented to person, place, and time.     Motor: Weakness present.  Psychiatric:        Attention and Perception: She is attentive.        Mood and Affect: Mood normal.        Speech: Speech normal.        Behavior: Behavior normal. Behavior is cooperative.        Thought Content: Thought content normal.        Judgment: Judgment normal.     Sacral decubitus ulcer 08/25/2021:     Right BKA site 08/25/2021:      Left foot 08/25/2021:          Assessment & Plan:   Sacral decubitus ulcer with osteomyelitis:  Quite encouraged by the appearance of her wound and the repeat MRI is also encouraging.  I will repeat her sed rate C-reactive protein basic metabolic panel and CBC with differential.  We have discussed options and we decided to go forward with an course of amoxicillin 500 mg 3 times daily for 6 weeks.  I will see her back prior to stopping her oral antibiotics.  I do not think there is any good reason to give her parenteral antibiotics and has her the risks of a PICC line.  Right BKA site is well healed   Left foot: Sam the no evidence of diabetic foot infection.  History of renal transplantation  repeating labs today but her creatinine has been normal.  Certainly being on immunosuppressive drugs does make it more difficult for her to heal wounds and make her more prone to infection along with her diabetes mellitus.  I spent 44 minutes with the patient including  face to face counseling of the patient re her decubitus ulcer prior diabetic foot infection with osteomyelitis requiring BKA, her history of renal transplantation reviewing drug drug interactions with her, personally reviewing her MRI of the pelvis performed on August 11 comparing to January 23, 2021 along with her all that inflammatory markers from that.  Reviewing notes from Dr. Heber Eldorado from her inpatient stay  along with preview of medical records in preparation for the visit and during the visit and in coordination of her care.

## 2021-08-26 DIAGNOSIS — E1069 Type 1 diabetes mellitus with other specified complication: Secondary | ICD-10-CM | POA: Diagnosis not present

## 2021-08-26 DIAGNOSIS — L89154 Pressure ulcer of sacral region, stage 4: Secondary | ICD-10-CM | POA: Diagnosis not present

## 2021-08-26 DIAGNOSIS — L89522 Pressure ulcer of left ankle, stage 2: Secondary | ICD-10-CM | POA: Diagnosis not present

## 2021-08-26 DIAGNOSIS — E785 Hyperlipidemia, unspecified: Secondary | ICD-10-CM | POA: Diagnosis not present

## 2021-08-26 DIAGNOSIS — E1022 Type 1 diabetes mellitus with diabetic chronic kidney disease: Secondary | ICD-10-CM | POA: Diagnosis not present

## 2021-08-26 DIAGNOSIS — K219 Gastro-esophageal reflux disease without esophagitis: Secondary | ICD-10-CM | POA: Diagnosis not present

## 2021-08-26 DIAGNOSIS — R339 Retention of urine, unspecified: Secondary | ICD-10-CM | POA: Diagnosis not present

## 2021-08-26 DIAGNOSIS — N1831 Chronic kidney disease, stage 3a: Secondary | ICD-10-CM | POA: Diagnosis not present

## 2021-08-26 DIAGNOSIS — F321 Major depressive disorder, single episode, moderate: Secondary | ICD-10-CM | POA: Diagnosis not present

## 2021-08-26 DIAGNOSIS — I129 Hypertensive chronic kidney disease with stage 1 through stage 4 chronic kidney disease, or unspecified chronic kidney disease: Secondary | ICD-10-CM | POA: Diagnosis not present

## 2021-08-26 DIAGNOSIS — C519 Malignant neoplasm of vulva, unspecified: Secondary | ICD-10-CM | POA: Diagnosis not present

## 2021-08-26 DIAGNOSIS — Z7952 Long term (current) use of systemic steroids: Secondary | ICD-10-CM | POA: Diagnosis not present

## 2021-08-26 DIAGNOSIS — Z466 Encounter for fitting and adjustment of urinary device: Secondary | ICD-10-CM | POA: Diagnosis not present

## 2021-08-26 DIAGNOSIS — Z435 Encounter for attention to cystostomy: Secondary | ICD-10-CM | POA: Diagnosis not present

## 2021-08-26 DIAGNOSIS — D631 Anemia in chronic kidney disease: Secondary | ICD-10-CM | POA: Diagnosis not present

## 2021-08-26 DIAGNOSIS — Z7982 Long term (current) use of aspirin: Secondary | ICD-10-CM | POA: Diagnosis not present

## 2021-08-26 LAB — CBC WITH DIFFERENTIAL/PLATELET
Absolute Monocytes: 493 cells/uL (ref 200–950)
Basophils Absolute: 21 cells/uL (ref 0–200)
Basophils Relative: 0.4 %
Eosinophils Absolute: 154 cells/uL (ref 15–500)
Eosinophils Relative: 2.9 %
HCT: 30.5 % — ABNORMAL LOW (ref 35.0–45.0)
Hemoglobin: 10.3 g/dL — ABNORMAL LOW (ref 11.7–15.5)
Lymphs Abs: 1903 cells/uL (ref 850–3900)
MCH: 36.8 pg — ABNORMAL HIGH (ref 27.0–33.0)
MCHC: 33.8 g/dL (ref 32.0–36.0)
MCV: 108.9 fL — ABNORMAL HIGH (ref 80.0–100.0)
MPV: 10.7 fL (ref 7.5–12.5)
Monocytes Relative: 9.3 %
Neutro Abs: 2730 cells/uL (ref 1500–7800)
Neutrophils Relative %: 51.5 %
Platelets: 215 10*3/uL (ref 140–400)
RBC: 2.8 10*6/uL — ABNORMAL LOW (ref 3.80–5.10)
RDW: 14.5 % (ref 11.0–15.0)
Total Lymphocyte: 35.9 %
WBC: 5.3 10*3/uL (ref 3.8–10.8)

## 2021-08-26 LAB — BASIC METABOLIC PANEL WITH GFR
BUN/Creatinine Ratio: 42 (calc) — ABNORMAL HIGH (ref 6–22)
BUN: 45 mg/dL — ABNORMAL HIGH (ref 7–25)
CO2: 24 mmol/L (ref 20–32)
Calcium: 9.6 mg/dL (ref 8.6–10.4)
Chloride: 106 mmol/L (ref 98–110)
Creat: 1.07 mg/dL — ABNORMAL HIGH (ref 0.50–1.05)
Glucose, Bld: 163 mg/dL — ABNORMAL HIGH (ref 65–99)
Potassium: 4 mmol/L (ref 3.5–5.3)
Sodium: 139 mmol/L (ref 135–146)
eGFR: 59 mL/min/{1.73_m2} — ABNORMAL LOW (ref >=60)

## 2021-08-26 LAB — SEDIMENTATION RATE: Sed Rate: 94 mm/h — ABNORMAL HIGH (ref 0–30)

## 2021-08-26 LAB — C-REACTIVE PROTEIN: CRP: 17.2 mg/L — ABNORMAL HIGH (ref <8.0)

## 2021-08-28 DIAGNOSIS — E1022 Type 1 diabetes mellitus with diabetic chronic kidney disease: Secondary | ICD-10-CM | POA: Diagnosis not present

## 2021-08-28 DIAGNOSIS — E10319 Type 1 diabetes mellitus with unspecified diabetic retinopathy without macular edema: Secondary | ICD-10-CM | POA: Diagnosis not present

## 2021-08-28 DIAGNOSIS — E1065 Type 1 diabetes mellitus with hyperglycemia: Secondary | ICD-10-CM | POA: Diagnosis not present

## 2021-08-28 DIAGNOSIS — I10 Essential (primary) hypertension: Secondary | ICD-10-CM | POA: Diagnosis not present

## 2021-08-28 DIAGNOSIS — E1040 Type 1 diabetes mellitus with diabetic neuropathy, unspecified: Secondary | ICD-10-CM | POA: Diagnosis not present

## 2021-08-28 DIAGNOSIS — N183 Chronic kidney disease, stage 3 unspecified: Secondary | ICD-10-CM | POA: Diagnosis not present

## 2021-09-01 DIAGNOSIS — D631 Anemia in chronic kidney disease: Secondary | ICD-10-CM | POA: Diagnosis not present

## 2021-09-01 DIAGNOSIS — N1831 Chronic kidney disease, stage 3a: Secondary | ICD-10-CM | POA: Diagnosis not present

## 2021-09-01 DIAGNOSIS — E1069 Type 1 diabetes mellitus with other specified complication: Secondary | ICD-10-CM | POA: Diagnosis not present

## 2021-09-01 DIAGNOSIS — I129 Hypertensive chronic kidney disease with stage 1 through stage 4 chronic kidney disease, or unspecified chronic kidney disease: Secondary | ICD-10-CM | POA: Diagnosis not present

## 2021-09-01 DIAGNOSIS — Z435 Encounter for attention to cystostomy: Secondary | ICD-10-CM | POA: Diagnosis not present

## 2021-09-01 DIAGNOSIS — K219 Gastro-esophageal reflux disease without esophagitis: Secondary | ICD-10-CM | POA: Diagnosis not present

## 2021-09-01 DIAGNOSIS — C519 Malignant neoplasm of vulva, unspecified: Secondary | ICD-10-CM | POA: Diagnosis not present

## 2021-09-01 DIAGNOSIS — Z7982 Long term (current) use of aspirin: Secondary | ICD-10-CM | POA: Diagnosis not present

## 2021-09-01 DIAGNOSIS — E1022 Type 1 diabetes mellitus with diabetic chronic kidney disease: Secondary | ICD-10-CM | POA: Diagnosis not present

## 2021-09-01 DIAGNOSIS — Z466 Encounter for fitting and adjustment of urinary device: Secondary | ICD-10-CM | POA: Diagnosis not present

## 2021-09-01 DIAGNOSIS — L89154 Pressure ulcer of sacral region, stage 4: Secondary | ICD-10-CM | POA: Diagnosis not present

## 2021-09-01 DIAGNOSIS — E785 Hyperlipidemia, unspecified: Secondary | ICD-10-CM | POA: Diagnosis not present

## 2021-09-01 DIAGNOSIS — F321 Major depressive disorder, single episode, moderate: Secondary | ICD-10-CM | POA: Diagnosis not present

## 2021-09-01 DIAGNOSIS — Z7952 Long term (current) use of systemic steroids: Secondary | ICD-10-CM | POA: Diagnosis not present

## 2021-09-01 DIAGNOSIS — L89522 Pressure ulcer of left ankle, stage 2: Secondary | ICD-10-CM | POA: Diagnosis not present

## 2021-09-01 DIAGNOSIS — R339 Retention of urine, unspecified: Secondary | ICD-10-CM | POA: Diagnosis not present

## 2021-09-03 DIAGNOSIS — L89322 Pressure ulcer of left buttock, stage 2: Secondary | ICD-10-CM | POA: Diagnosis not present

## 2021-09-04 DIAGNOSIS — N1831 Chronic kidney disease, stage 3a: Secondary | ICD-10-CM | POA: Diagnosis not present

## 2021-09-04 DIAGNOSIS — F321 Major depressive disorder, single episode, moderate: Secondary | ICD-10-CM | POA: Diagnosis not present

## 2021-09-04 DIAGNOSIS — E1022 Type 1 diabetes mellitus with diabetic chronic kidney disease: Secondary | ICD-10-CM | POA: Diagnosis not present

## 2021-09-04 DIAGNOSIS — E1069 Type 1 diabetes mellitus with other specified complication: Secondary | ICD-10-CM | POA: Diagnosis not present

## 2021-09-04 DIAGNOSIS — K219 Gastro-esophageal reflux disease without esophagitis: Secondary | ICD-10-CM | POA: Diagnosis not present

## 2021-09-04 DIAGNOSIS — C519 Malignant neoplasm of vulva, unspecified: Secondary | ICD-10-CM | POA: Diagnosis not present

## 2021-09-04 DIAGNOSIS — Z466 Encounter for fitting and adjustment of urinary device: Secondary | ICD-10-CM | POA: Diagnosis not present

## 2021-09-04 DIAGNOSIS — R339 Retention of urine, unspecified: Secondary | ICD-10-CM | POA: Diagnosis not present

## 2021-09-04 DIAGNOSIS — D631 Anemia in chronic kidney disease: Secondary | ICD-10-CM | POA: Diagnosis not present

## 2021-09-04 DIAGNOSIS — L89522 Pressure ulcer of left ankle, stage 2: Secondary | ICD-10-CM | POA: Diagnosis not present

## 2021-09-04 DIAGNOSIS — Z435 Encounter for attention to cystostomy: Secondary | ICD-10-CM | POA: Diagnosis not present

## 2021-09-04 DIAGNOSIS — Z7982 Long term (current) use of aspirin: Secondary | ICD-10-CM | POA: Diagnosis not present

## 2021-09-04 DIAGNOSIS — L89154 Pressure ulcer of sacral region, stage 4: Secondary | ICD-10-CM | POA: Diagnosis not present

## 2021-09-04 DIAGNOSIS — E785 Hyperlipidemia, unspecified: Secondary | ICD-10-CM | POA: Diagnosis not present

## 2021-09-04 DIAGNOSIS — I129 Hypertensive chronic kidney disease with stage 1 through stage 4 chronic kidney disease, or unspecified chronic kidney disease: Secondary | ICD-10-CM | POA: Diagnosis not present

## 2021-09-04 DIAGNOSIS — Z7952 Long term (current) use of systemic steroids: Secondary | ICD-10-CM | POA: Diagnosis not present

## 2021-09-07 ENCOUNTER — Other Ambulatory Visit: Payer: Self-pay

## 2021-09-07 ENCOUNTER — Encounter (HOSPITAL_BASED_OUTPATIENT_CLINIC_OR_DEPARTMENT_OTHER): Payer: BC Managed Care – PPO | Attending: Internal Medicine | Admitting: Internal Medicine

## 2021-09-07 DIAGNOSIS — E109 Type 1 diabetes mellitus without complications: Secondary | ICD-10-CM | POA: Diagnosis not present

## 2021-09-07 DIAGNOSIS — Z87891 Personal history of nicotine dependence: Secondary | ICD-10-CM | POA: Insufficient documentation

## 2021-09-07 DIAGNOSIS — Z89511 Acquired absence of right leg below knee: Secondary | ICD-10-CM | POA: Diagnosis not present

## 2021-09-07 DIAGNOSIS — E1040 Type 1 diabetes mellitus with diabetic neuropathy, unspecified: Secondary | ICD-10-CM | POA: Insufficient documentation

## 2021-09-07 DIAGNOSIS — L89153 Pressure ulcer of sacral region, stage 3: Secondary | ICD-10-CM | POA: Diagnosis not present

## 2021-09-07 DIAGNOSIS — Z940 Kidney transplant status: Secondary | ICD-10-CM | POA: Diagnosis not present

## 2021-09-07 NOTE — Progress Notes (Addendum)
YOLANDO, MCGINN ([id number]) Visit Report for 09/07/2021 Arrival Information Details Patient Name: Date of Service: [name], [name] 09/07/2021 3:00 PM Medical Record Number: [id number] Patient Account Number: [id number] Date of Birth/Sex: Treating RN: 24-Jun-1961 (60 y.o. Elam Dutch Primary Care Kendre Sires: [name] Other Clinician: Referring Anacaren Kohan: Treating Connelly [place]/Extender: [name] in Treatment: 11 Visit Information History Since Last Visit Added or deleted any medications: No Patient Arrived: Wheel Chair Any new allergies or adverse reactions: No Arrival Time: 14:53 Had a fall or experienced change in No Accompanied By: husband activities of daily living that may affect Transfer Assistance: EasyPivot Patient Lift risk of falls: Patient Identification Verified: Yes Signs or symptoms of abuse/neglect since last visito No Secondary Verification Process Completed: Yes Hospitalized since last visit: No Patient Requires Transmission-Based Precautions: No Implantable device outside of the clinic excluding No Patient Has Alerts: No cellular tissue based products placed in the center since last visit: Has Dressing in Place as Prescribed: Yes Pain Present Now: Yes Electronic Signature(s) Signed: 09/07/2021 4:50:28 PM By: Baruch Gouty RN, BSN Entered By: Baruch Gouty on 09/07/2021 15:03:26 -------------------------------------------------------------------------------- Clinic Level of Care Assessment Details Patient Name: Date of Service: [name], [name] 09/07/2021 3:00 PM Medical Record Number: [id number] Patient Account Number: [id number] Date of Birth/Sex: Treating RN: 23-Feb-1961 (60 y.o. Elam Dutch Primary Care Yurianna Tusing: [name] Other Clinician: Referring Ludmila Ebarb: Treating Kathern Lobosco/Extender: [name] in Treatment: 11 Clinic Level of Care Assessment Items TOOL 4 Quantity  Score '[]'$  - 0 Use when only an EandM is performed on FOLLOW-UP visit ASSESSMENTS - Nursing Assessment / Reassessment X- 1 10 Reassessment of Co-morbidities (includes updates in patient status) X- 1 5 Reassessment of Adherence to Treatment Plan ASSESSMENTS - Wound and Skin A ssessment / Reassessment X - Simple Wound Assessment / Reassessment - one wound 1 5 '[]'$  - 0 Complex Wound Assessment / Reassessment - multiple wounds '[]'$  - 0 Dermatologic / Skin Assessment (not related to wound area) ASSESSMENTS - Focused Assessment '[]'$  - 0 Circumferential Edema Measurements - multi extremities '[]'$  - 0 Nutritional Assessment / Counseling / Intervention '[]'$  - 0 Lower Extremity Assessment (monofilament, tuning fork, pulses) '[]'$  - 0 Peripheral Arterial Disease Assessment (using hand held doppler) ASSESSMENTS - Ostomy and/or Continence Assessment and Care '[]'$  - 0 Incontinence Assessment and Management '[]'$  - 0 Ostomy Care Assessment and Management (repouching, etc.) PROCESS - Coordination of Care X - Simple Patient / Family Education for ongoing care 1 15 '[]'$  - 0 Complex (extensive) Patient / Family Education for ongoing care X- 1 10 Staff obtains Programmer, systems, Records, T Results / Process Orders est X- 1 10 Staff telephones HHA, Nursing Homes / Clarify orders / etc '[]'$  - 0 Routine Transfer to another Facility (non-emergent condition) '[]'$  - 0 Routine Hospital Admission (non-emergent condition) '[]'$  - 0 New Admissions / Biomedical engineer / Ordering NPWT Apligraf, etc. , '[]'$  - 0 Emergency Hospital Admission (emergent condition) X- 1 10 Simple Discharge Coordination '[]'$  - 0 Complex (extensive) Discharge Coordination PROCESS - Special Needs '[]'$  - 0 Pediatric / Minor Patient Management '[]'$  - 0 Isolation Patient Management '[]'$  - 0 Hearing / Language / Visual special needs '[]'$  - 0 Assessment of Community assistance (transportation, D/C planning, etc.) '[]'$  - 0 Additional assistance / Altered  mentation '[]'$  - 0 Support Surface(s) Assessment (bed, cushion, seat, etc.) INTERVENTIONS - Wound Cleansing / Measurement X - Simple Wound Cleansing - one wound 1 5 '[]'$  - 0 Complex Wound Cleansing - multiple wounds  X- 1 5 Wound Imaging (photographs - any number of wounds) '[]'$  - 0 Wound Tracing (instead of photographs) X- 1 5 Simple Wound Measurement - one wound '[]'$  - 0 Complex Wound Measurement - multiple wounds INTERVENTIONS - Wound Dressings X - Small Wound Dressing one or multiple wounds 1 10 '[]'$  - 0 Medium Wound Dressing one or multiple wounds '[]'$  - 0 Large Wound Dressing one or multiple wounds X- 1 5 Application of Medications - topical '[]'$  - 0 Application of Medications - injection INTERVENTIONS - Miscellaneous '[]'$  - 0 External ear exam '[]'$  - 0 Specimen Collection (cultures, biopsies, blood, body fluids, etc.) '[]'$  - 0 Specimen(s) / Culture(s) sent or taken to Lab for analysis '[]'$  - 0 Patient Transfer (multiple staff / Civil Service fast streamer / Similar devices) '[]'$  - 0 Simple Staple / Suture removal (25 or less) '[]'$  - 0 Complex Staple / Suture removal (26 or more) '[]'$  - 0 Hypo / Hyperglycemic Management (close monitor of Blood Glucose) '[]'$  - 0 Ankle / Brachial Index (ABI) - do not check if billed separately X- 1 5 Vital Signs Has the patient been seen at the hospital within the last three years: Yes Total Score: 100 Level Of Care: New/Established - Level 3 Electronic Signature(s) Signed: 09/07/2021 4:50:28 PM By: Baruch Gouty RN, BSN Entered By: Baruch Gouty on 09/07/2021 16:21:15 -------------------------------------------------------------------------------- Encounter Discharge Information Details Patient Name: Date of Service: [name], [name] 09/07/2021 3:00 PM Medical Record Number: [id number] Patient Account Number: [id number] Date of Birth/Sex: Treating RN: 02/21/1961 (60 y.o. Elam Dutch Primary Care Tareq Dwan: [name] Other Clinician: Referring  Chakara Bognar: Treating Gerrald Basu/Extender: [name] in Treatment: 11 Encounter Discharge Information Items Discharge Condition: Stable Ambulatory Status: Wheelchair Discharge Destination: Home Transportation: Private Auto Accompanied By: spouse Schedule Follow-up Appointment: Yes Clinical Summary of Care: Patient Declined Electronic Signature(s) Signed: 09/07/2021 4:50:28 PM By: Baruch Gouty RN, BSN Entered By: Baruch Gouty on 09/07/2021 16:20:40 -------------------------------------------------------------------------------- Multi Wound Chart Details Patient Name: Date of Service: [name], [name] 09/07/2021 3:00 PM Medical Record Number: [id number] Patient Account Number: [id number] Date of Birth/Sex: Treating RN: 1961/09/11 (60 y.o. [name] Primary Care Laylee Schooley: [name] Other Clinician: Referring Zaheer Wageman: Treating Jeancarlo Leffler/Extender: [name]: 11 Vital Signs Height(in): 53 Capillary Blood Glucose(mg/dl): 197 Weight(lbs): 110 Pulse(bpm): 74 Body Mass Index(BMI): 28 Blood Pressure(mmHg): 144/77 Temperature(F): 98.1 Respiratory Rate(breaths/min): 16 Photos: [1:Sacrum] [N/A:N/A N/A] Wound Location: [1:Pressure Injury] [N/A:N/A] Wounding Event: [1:Pressure Ulcer] [N/A:N/A] Primary Etiology: [1:Anemia, Type I Diabetes, End Stage N/A] Comorbid History: [1:Renal Disease, Osteoarthritis, Osteomyelitis, Neuropathy, Received Radiation 02/24/2021] [N/A:N/A] Date Acquired: [1:11] [N/A:N/A] Weeks of Treatment: [1:Open] [N/A:N/A] Wound Status: [1:5.1x4x0.9] [N/A:N/A] Measurements L x W x D (cm) [1:16.022] [N/A:N/A] A (cm) : rea [1:14.42] [N/A:N/A] Volume (cm) : [1:35.00%] [N/A:N/A] % Reduction in A rea: [1:61.00%] [N/A:N/A] % Reduction in Volume: [1:12] Starting Position 1 (o'clock): [1:4] Ending Position 1 (o'clock): [1:1.1] Maximum Distance 1 (cm): [1:Yes] [N/A:N/A] Undermining:  [1:Category/Stage III] [N/A:N/A] Classification: [1:Medium] [N/A:N/A] Exudate A mount: [1:Purulent] [N/A:N/A] Exudate Type: [1:yellow, brown, green] [N/A:N/A] Exudate Color: [1:Well defined, not attached] [N/A:N/A] Wound Margin: [1:Large (67-100%)] [N/A:N/A] Granulation A mount: [1:Red, Pink] [N/A:N/A] Granulation Quality: [1:None Present (0%)] [N/A:N/A] Necrotic A mount: [1:Fat Layer (Subcutaneous Tissue): Yes N/A] Exposed Structures: [1:Fascia: No Tendon: No Muscle: No Joint: No Bone: No Small (1-33%)] [N/A:N/A] Epithelialization: [1:red periwound, blue-green drainage] [N/A:N/A] Treatment Notes Wound #1 (Sacrum) Cleanser Wound Cleanser Discharge Instruction: Cleanse the wound with wound cleanser prior to applying a clean dressing  using gauze sponges, not tissue or cotton balls. Peri-Wound Care Ketoconazole Cream 2% Discharge Instruction: Apply Ketoconazole in clinic Zinc Oxide Ointment 30g tube Discharge Instruction: Apply Zinc Oxide or desitin to periwound with each dressing change Topical Gentamicin Discharge Instruction: thin layer to wound bed in clinic only Primary Dressing Dakin's Solution 0.125%, 16 (oz) Discharge Instruction: Moisten gauze with Dakin's solution and pack lightly into wound Secondary Dressing Woven Gauze Sponge, Non-Sterile 4x4 in Discharge Instruction: Apply over primary dressing as directed. Zetuvit Plus Silicone Border Dressing 7x7(in/in) Discharge Instruction: Apply silicone border over primary dressing as directed. Secured With Compression Wrap Compression Stockings Environmental education officer) Signed: 09/07/2021 5:02:16 PM By: [name] DO Signed: 09/08/2021 4:34:23 PM By: [name] Signed: 09/08/2021 4:34:23 PM By: Fara Chute By: [name] on 09/07/2021 16:56:54 -------------------------------------------------------------------------------- Multi-Disciplinary Care Plan Details Patient Name: Date of  Service: [name], [name] 09/07/2021 3:00 PM Medical Record Number: [id number] Patient Account Number: [id number] Date of Birth/Sex: Treating RN: 08/24/1961 (60 y.o. Elam Dutch Primary Care Dorenda Pfannenstiel: [name] Other Clinician: Referring Delonda Coley: Treating Wylie Coon/Extender: [name] in Treatment: 11 Active Inactive Wound/Skin Impairment Nursing Diagnoses: Impaired tissue integrity Knowledge deficit related to ulceration/compromised skin integrity Goals: Patient/caregiver will verbalize understanding of skin care regimen Date Initiated: 06/19/2021 Target Resolution Date: 10/05/2021 Goal Status: Active Ulcer/skin breakdown will have a volume reduction of 30% by week 4 Date Initiated: 06/19/2021 Date Inactivated: 07/27/2021 Target Resolution Date: 07/02/2021 Goal Status: Unmet Unmet Reason: pressure relief Interventions: Assess patient/caregiver ability to obtain necessary supplies Assess patient/caregiver ability to perform ulcer/skin care regimen upon admission and as needed Assess ulceration(s) every visit Provide education on ulcer and skin care Notes: Electronic Signature(s) Signed: 09/07/2021 4:50:28 PM By: Baruch Gouty RN, BSN Entered By: Baruch Gouty on 09/07/2021 15:59:44 -------------------------------------------------------------------------------- Pain Assessment Details Patient Name: Date of Service: [name], [name] 09/07/2021 3:00 PM Medical Record Number: [id number] Patient Account Number: [id number] Date of Birth/Sex: Treating RN: 10-03-1961 (60 y.o. Elam Dutch Primary Care Ayaat Jansma: [name] Other Clinician: Referring Genecis Veley: Treating Merrin Mcvicker/Extender: [name] in Treatment: 11 Active Problems Location of Pain Severity and Description of Pain Patient Has Paino Yes Site Locations Rate the pain. Rate the pain. Current Pain Level: 4 Pain Management and  Medication Current Pain Management: Electronic Signature(s) Signed: 09/07/2021 4:50:28 PM By: Baruch Gouty RN, BSN Entered By: Baruch Gouty on 09/07/2021 15:04:06 -------------------------------------------------------------------------------- Patient/Caregiver Education Details Patient Name: Date of Service: [name], [name] 9/12/2022andnbsp3:00 PM Medical Record Number: [id number] Patient Account Number: [id number] Date of Birth/Gender: Treating RN: 11/24/1961 (60 y.o. Elam Dutch Primary Care Physician: [name] Other Clinician: Referring Physician: Treating Physician/Extender: [name] in Treatment: 11 Education Assessment Education Provided To: Patient Education Topics Provided Pressure: Methods: Explain/Verbal Responses: Reinforcements needed, State content correctly Wound/Skin Impairment: Methods: Explain/Verbal Responses: Reinforcements needed, State content correctly Electronic Signature(s) Signed: 09/07/2021 4:50:28 PM By: Baruch Gouty RN, BSN Entered By: Baruch Gouty on 09/07/2021 16:02:49 -------------------------------------------------------------------------------- Wound Assessment Details Patient Name: Date of Service: [name], [name] 09/07/2021 3:00 PM Medical Record Number: [id number] Patient Account Number: [id number] Date of Birth/Sex: Treating RN: [birth year] (60 y.o. [name] Primary Care Arth Nicastro: [name] Other Clinician: Referring Isys Tietje: Treating Zikeria Keough/Extender: [name]: 11 Wound Status Wound Number: 1 Primary Pressure Ulcer Etiology: Wound Location: Sacrum Wound Open Wounding Event: Pressure Injury Status: Date Acquired: 02/24/2021 Comorbid Anemia, Type I Diabetes, End Stage Renal Disease, Osteoarthritis, Weeks Of Treatment: 11  History: Osteomyelitis, Neuropathy, Received Radiation Clustered Wound:  No Photos Wound Measurements Length: (cm) 5.1 Width: (cm) 4 Depth: (cm) 0.9 Area: (cm) 16.022 Volume: (cm) 14.42 % Reduction in Area: 35% % Reduction in Volume: 61% Epithelialization: Small (1-33%) Tunneling: No Undermining: Yes Starting Position (o'clock): 12 Ending Position (o'clock): 4 Maximum Distance: (cm) 1.1 Wound Description Classification: Category/Stage III Wound Margin: Well defined, not attached Exudate Amount: Medium Exudate Type: Purulent Exudate Color: yellow, brown, green Foul Odor After Cleansing: No Slough/Fibrino No Wound Bed Granulation Amount: Large (67-100%) Exposed Structure Granulation Quality: Red, Pink Fascia Exposed: No Necrotic Amount: None Present (0%) Fat Layer (Subcutaneous Tissue) Exposed: Yes Tendon Exposed: No Muscle Exposed: No Joint Exposed: No Bone Exposed: No Assessment Notes red periwound, blue-green drainage Treatment Notes Wound #1 (Sacrum) Cleanser Wound Cleanser Discharge Instruction: Cleanse the wound with wound cleanser prior to applying a clean dressing using gauze sponges, not tissue or cotton balls. Peri-Wound Care Ketoconazole Cream 2% Discharge Instruction: Apply Ketoconazole in clinic Zinc Oxide Ointment 30g tube Discharge Instruction: Apply Zinc Oxide or desitin to periwound with each dressing change Topical Gentamicin Discharge Instruction: thin layer to wound bed in clinic only Primary Dressing Dakin's Solution 0.125%, 16 (oz) Discharge Instruction: Moisten gauze with Dakin's solution and pack lightly into wound Secondary Dressing Woven Gauze Sponge, Non-Sterile 4x4 in Discharge Instruction: Apply over primary dressing as directed. Zetuvit Plus Silicone Border Dressing 7x7(in/in) Discharge Instruction: Apply silicone border over primary dressing as directed. Secured With Compression Wrap Compression Stockings Environmental education officer) Signed: 09/07/2021 4:41:05 PM By: [name] Signed:  09/07/2021 4:50:28 PM By: Baruch Gouty RN, BSN Entered By: Baruch Gouty on 09/07/2021 15:10:21 -------------------------------------------------------------------------------- Vitals Details Patient Name: Date of Service: [name], [name] 09/07/2021 3:00 PM Medical Record Number: [id number] Patient Account Number: [id number] Date of Birth/Sex: Treating RN: [birth year] (60 y.o. Elam Dutch Primary Care Tayton Decaire: [name] Other Clinician: Referring Eames Dibiasio: Treating Maziah Keeling/Extender: [name] in Treatment: 11 Vital Signs Time Taken: 15:03 Temperature (F): 98.1 Height (in): 53 Pulse (bpm): 77 Weight (lbs): 110 Respiratory Rate (breaths/min): 16 Body Mass Index (BMI): 27.5 Blood Pressure (mmHg): 144/77 Capillary Blood Glucose (mg/dl): 197 Reference Range: 80 - 120 mg / dl Electronic Signature(s) Signed: 09/07/2021 4:50:28 PM By: Baruch Gouty RN, BSN Entered By: Baruch Gouty on 09/07/2021 15:03:54

## 2021-09-08 ENCOUNTER — Other Ambulatory Visit (HOSPITAL_COMMUNITY)
Admit: 2021-09-08 | Discharge: 2021-09-08 | Disposition: A | Discharge status: Home / Self Care | Payer: BC Managed Care – PPO | Source: Ambulatory Visit | Attending: Internal Medicine | Admitting: Internal Medicine

## 2021-09-08 DIAGNOSIS — B999 Unspecified infectious disease: Secondary | ICD-10-CM | POA: Diagnosis not present

## 2021-09-08 NOTE — Progress Notes (Signed)
Doris, Lopez ([id number]) Visit Report for 09/07/2021 Chief Complaint Document Details Patient Name: Date of Service: [name], [name] 09/07/2021 3:00 PM Medical Record Number: [id number] Patient Account Number: [id number] Date of Birth/Sex: Treating RN: [birth year] (60 y.o. [name] Primary Care Provider: [name] Other Clinician: Referring Provider: Treating Provider/Extender: [name]: 11 Information Obtained from: Patient Chief Complaint Sacral ulcer Electronic Signature(s) Signed: 09/07/2021 5:02:16 PM By: [name] DO Entered By: [name] on 09/07/2021 16:57:06 -------------------------------------------------------------------------------- HPI Details Patient Name: Date of Service: Doris, Lopez 09/07/2021 3:00 PM Medical Record Number: [id number] Patient Account Number: [id number] Date of Birth/Sex: Treating RN: Oct 15, 1961 (60 y.o. [name] Primary Care Provider: [name] Other Clinician: Referring Provider: Treating Provider/Extender: [name]: 11 History of Present Illness HPI Description: Admission 6/24 [name] is a 60 year old female with a past medical history of type 1 diabetes, right BKA, and kidney transplant that presents to the clinic for an 70-monthhistory of sacral ulcer. She has been using wet-to-dry dressings up until 5 weeks ago when she was started on a wound VAC. 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 She has completed 6 weeks of treatment and states she has been discharged by infectious disease. She currently denies any issues to the wound area. She reports minimal pain. She denies signs of infection. 7/11; patient presents for 2-week follow-up. She uses a wound VAC that is changed with home health 3 times a week. She denies any signs of infection.  She has no complaints or issues today. 8/1; patient presents for follow-up. She continues to use a wound VAC and this is changed with home health. She reports pain to the sacrum that started 1 week ago. She denies systemic signs of infection. 8/15; patient presents for 2-week follow-up. She has been using wet-to-dry dressings daily. She reports improvement in the wound size and appearance. She denies infectious symptoms. 9/12; patient presents for follow-up. She has been using wet-to-dry Dakin's moistened gauze. She reports following up with infectious disease and is currently taking amoxicillin. Electronic Signature(s) Signed: 09/07/2021 5:02:16 PM By: HKalman ShanDO Entered By: HKalman Shanon 09/07/2021 16:57:38 -------------------------------------------------------------------------------- Physical Exam Details Patient Name: Date of Service: Doris, [name]9/11/2021 3:00 PM Medical Record Number: [id number] Account Number: [id number] of Birth/Sex: Treating RN: 107/18/1962(60y.o. FSue LushPrimary Care Provider: MMelissa MontaneOther Clinician: Referring Provider: Treating Provider/Extender: HHoyt KochWeeks in Treatment: 11 Constitutional respirations regular, non-labored and within target range for patient..Marland KitchenPsychiatric pleasant and cooperative. Notes Sacral region: Large open wound with granulation tissue present. Undermining circumferentially. Increased skin irritation circumferentially Electronic Signature(s) Signed: 09/07/2021 5:02:16 PM By: HKalman ShanDO Entered By: HKalman Shanon 09/07/2021 16:58:19 -------------------------------------------------------------------------------- Physician Orders Details Patient Name: Date of Service: Doris, [name]9/11/2021 3:00 PM Medical Record Number: [id number] Account Number: [id number] of Birth/Sex: Treating RN: 130-Jan-1962(60y.o. FElam DutchPrimary Care Provider: MMelissa MontaneOther Clinician: Referring Provider: Treating Provider/Extender: HFelicie Mornin Treatment: 11 Verbal / Phone Orders: No Diagnosis Coding ICD-10 Coding Code Description L89.153 Pressure ulcer of sacral region, stage 3 E10.9 Type 1 diabetes mellitus without complications Z0000000Acquired absence of right leg below knee Z94.0 Kidney transplant status Follow-up Appointments Return Appointment in 2 weeks. Bathing/ Shower/ Hygiene Do not shower or bathe in tub. Off-Loading Turn and reposition every 2  hours Additional Orders / Instructions Follow Nutritious Diet Home Health No change in wound care orders this week; continue Home Health for wound care. May utilize formulary equivalent dressing for wound treatment orders unless otherwise specified. Dressing changes to be completed by [place] on Monday / Wednesday / Friday except when patient has scheduled visit at [facility]. Other Home Health Orders/Instructions: - [place] [facility] Oxygen Therapy Evaluate for HBO Therapy - Patient to think about HBO treatment Wound Treatment Wound #1 - Sacrum Cleanser: Wound Cleanser ([place]) 1 x Per Day/30 Days Discharge Instructions: Cleanse the wound with wound cleanser prior to applying a clean dressing using gauze sponges, not tissue or cotton balls. Peri-Wound Care: Ketoconazole Cream 2% 1 x Per Day/30 Days Discharge Instructions: Apply Ketoconazole in clinic Peri-Wound Care: Zinc Oxide Ointment 30g tube 1 x Per Day/30 Days Discharge Instructions: Apply Zinc Oxide or desitin to periwound with each dressing change Topical: Gentamicin 1 x Per Day/30 Days Discharge Instructions: thin layer to wound bed in clinic only Prim Dressing: Dakin's Solution 0.125%, 16 (oz) (Home Health) 1 x Per Day/30 Days ary Discharge Instructions: Moisten gauze with Dakin's solution and pack lightly into  wound Secondary Dressing: Woven Gauze Sponge, Non-Sterile 4x4 in (Home Health) 1 x Per Day/30 Days Discharge Instructions: Apply over primary dressing as directed. Secondary Dressing: Zetuvit Plus Silicone Border Dressing 7x7(in/in) (Home Health) 1 x Per Day/30 Days Discharge Instructions: Apply silicone border over primary dressing as directed. Laboratory naerobe culture (MICRO) Bacteria identified in Unspecified specimen by A LOINC Code: [id number] Convenience Name: Anerobic culture Electronic Signature(s) Signed: 09/07/2021 5:02:16 PM By: [name] DO Previous Signature: 09/07/2021 4:50:28 PM Version By: Baruch Gouty RN, BSN Entered By: [name] on 09/07/2021 16:58:47 -------------------------------------------------------------------------------- Problem List Details Patient Name: Date of Service: HYDIE, SOUTHERLY 09/07/2021 3:00 PM Medical Record Number: [id number] Patient Account Number: [id number] Date of Birth/Sex: Treating RN: [birth year] (60 y.o. Elam Dutch Primary Care Provider: [name] Other Clinician: Referring Provider: Treating Provider/Extender: [name]: 11 Active Problems ICD-10 Encounter Code Description Active Date MDM Diagnosis L89.153 Pressure ulcer of sacral region, stage 3 06/19/2021 No Yes E10.9 Type 1 diabetes mellitus without complications [id number] No Yes Z89.511 Acquired absence of right leg below knee 06/19/2021 No Yes Z94.0 Kidney transplant status 06/19/2021 No Yes Inactive Problems Resolved Problems Electronic Signature(s) Signed: 09/07/2021 5:02:16 PM By: [name] DO Previous Signature: 09/07/2021 4:50:28 PM Version By: Baruch Gouty RN, BSN Entered By: [name] on 09/07/2021 16:56:47 -------------------------------------------------------------------------------- Progress Note Details Patient Name: Date of Service: DASHANTI, GUERRERA 09/07/2021 3:00  PM Medical Record Number: [id number] Patient Account Number: [id number] Date of Birth/Sex: Treating RN: 1961/02/07 (60 y.o. [name] Primary Care Provider: [name] Other Clinician: Referring Provider: Treating Provider/Extender: [name] in Treatment: 11 Subjective Chief Complaint Information obtained from Patient Sacral ulcer History of Present Illness (HPI) Admission 6/24 Ms. Jaimie Guerino is a 60 year old female with a past medical history of type 1 diabetes, right BKA, and kidney transplant that presents to the clinic for an 25-monthhistory of sacral ulcer. She has been using wet-to-dry dressings up until 5 weeks ago when she was started on a wound VAC. 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 She has completed 6 weeks of treatment and states she has been discharged by infectious disease. She currently denies any issues to the wound area. She reports minimal pain.  She denies signs of infection. 7/11; patient presents for 2-week follow-up. She uses a wound VAC that is changed with home health 3 times a week. She denies any signs of infection. She has no complaints or issues today. 8/1; patient presents for follow-up. She continues to use a wound VAC and this is changed with home health. She reports pain to the sacrum that started 1 week ago. She denies systemic signs of infection. 8/15; patient presents for 2-week follow-up. She has been using wet-to-dry dressings daily. She reports improvement in the wound size and appearance. She denies infectious symptoms. 9/12; patient presents for follow-up. She has been using wet-to-dry Dakin's moistened gauze. She reports following up with infectious disease and is currently taking amoxicillin. Patient History Information obtained from Patient. Family History Cancer - Father, Diabetes - Father, Heart Disease - Maternal Grandparents,  Hypertension - Mother, Thyroid Problems - Mother,Siblings, No family history of Hereditary Spherocytosis, Kidney Disease, Lung Disease, Seizures, Stroke, Tuberculosis. Social History Former smoker - from teens to 66's, Marital Status - Married, Alcohol Use - Rarely - beer, Drug Use - No History, Caffeine Use - Daily - coffee. Medical History Hematologic/Lymphatic Patient has history of Anemia Endocrine Patient has history of Type I Diabetes - s Genitourinary Patient has history of End Stage Renal Disease - off dialysis since transplants Immunological Denies history of Lupus Erythematosus, Raynaudoos, Scleroderma Integumentary (Skin) Denies history of History of Burn Musculoskeletal Patient has history of Osteoarthritis, Osteomyelitis Neurologic Patient has history of Neuropathy Oncologic Patient has history of Received Radiation Denies history of Received Chemotherapy Hospitalization/Surgery History - infected wound (pressure) 2022. - (R) BKA amp Oct 2021. Objective Constitutional respirations regular, non-labored and within target range for patient.. Vitals Time Taken: 3:03 PM, Height: 53 in, Weight: 110 lbs, BMI: 27.5, Temperature: 98.1 F, Pulse: 77 bpm, Respiratory Rate: 16 breaths/min, Blood Pressure: 144/77 mmHg, Capillary Blood Glucose: 197 mg/dl. Psychiatric pleasant and cooperative. General Notes: Sacral region: Large open wound with granulation tissue present. Undermining circumferentially. Increased skin irritation circumferentially Integumentary (Hair, Skin) Wound #1 status is Open. Original cause of wound was Pressure Injury. The date acquired was: 02/24/2021. The wound has been in treatment 11 weeks. The wound is located on the Sacrum. The wound measures 5.1cm length x 4cm width x 0.9cm depth; 16.022cm^2 area and 14.42cm^3 volume. There is Fat Layer (Subcutaneous Tissue) exposed. There is no tunneling noted, however, there is undermining starting at 12:00 and ending at  4:00 with a maximum distance of 1.1cm. There is a medium amount of purulent drainage noted. The wound margin is well defined and not attached to the wound base. There is large (67-100%) red, pink granulation within the wound bed. There is no necrotic tissue within the wound bed. General Notes: red periwound, blue-green drainage Assessment Active Problems ICD-10 Pressure ulcer of sacral region, stage 3 Type 1 diabetes mellitus without complications Acquired absence of right leg below knee Kidney transplant status Patient's wound has shown improvement in size and appearance since last clinic visit. She saw Dr. Tommy Medal, infectious disease for an MRI that showed residual osteomyelitis. She was prescribed amoxicillin for the next 6 weeks. She had bright green and blue drainage on her dressing today. This is suspicious for Pseudomonas. I recommended using gentamicin today with dressing change in the clinic. I obtained a wound culture. She may benefit from topical antibiotic for this. As for the increased irritation around the wound bed I recommended zinc oxide and antifungal cream. Plan Follow-up Appointments: Return Appointment in  2 weeks. Bathing/ Shower/ Hygiene: Do not shower or bathe in tub. Off-Loading: Turn and reposition every 2 hours Additional Orders / Instructions: Follow Nutritious Diet Home Health: No change in wound care orders this week; continue Home Health for wound care. May utilize formulary equivalent dressing for wound treatment orders unless otherwise specified. Dressing changes to be completed by [place] on Monday / Wednesday / Friday except when patient has scheduled visit at [facility]. Other Home Health Orders/Instructions: - [place] Hyperbaric Oxygen Therapy: Evaluate for HBO Therapy - Patient to think about HBO treatment Laboratory ordered were: Anerobic culture WOUND #1: - Sacrum Wound Laterality: Cleanser: Wound Cleanser (Home Health)  1 x Per Day/30 Days Discharge Instructions: Cleanse the wound with wound cleanser prior to applying a clean dressing using gauze sponges, not tissue or cotton balls. Peri-Wound Care: Ketoconazole Cream 2% 1 x Per Day/30 Days Discharge Instructions: Apply Ketoconazole in clinic Peri-Wound Care: Zinc Oxide Ointment 30g tube 1 x Per Day/30 Days Discharge Instructions: Apply Zinc Oxide or desitin to periwound with each dressing change Topical: Gentamicin 1 x Per Day/30 Days Discharge Instructions: thin layer to wound bed in clinic only Prim Dressing: Dakin's Solution 0.125%, 16 (oz) (Home Health) 1 x Per Day/30 Days ary Discharge Instructions: Moisten gauze with Dakin's solution and pack lightly into wound Secondary Dressing: Woven Gauze Sponge, Non-Sterile 4x4 in (Home Health) 1 x Per Day/30 Days Discharge Instructions: Apply over primary dressing as directed. Secondary Dressing: Zetuvit Plus Silicone Border Dressing 7x7(in/in) (Home Health) 1 x Per Day/30 Days Discharge Instructions: Apply silicone border over primary dressing as directed. 1. Continue Dakin's wet-to-dry moist and gauze 2. Wound culture 3. Zinc oxide and antifungal cream to the periwound 4. Add gentamicin in clinic only to the wound bed. Electronic Signature(s) Signed: 09/07/2021 5:02:16 PM By: [name] DO Entered By: [name] on 09/07/2021 17:01:20 -------------------------------------------------------------------------------- HxROS Details Patient Name: Date of Service: ANEVAY, MARTS 09/07/2021 3:00 PM Medical Record Number: [id number] Patient Account Number: [id number] Date of Birth/Sex: Treating RN: [birth year] (60 y.o. [name] Primary Care Provider: [name] Other Clinician: Referring Provider: Treating Provider/Extender: [name] in Treatment: 11 Information Obtained From Patient Hematologic/Lymphatic Medical History: Positive for:  Anemia Endocrine Medical History: Positive for: Type I Diabetes - s Time with diabetes: 10 years Treated with: Insulin Blood sugar tested every day: Yes Tested : constant with device Genitourinary Medical History: Positive for: End Stage Renal Disease - off dialysis since transplants Immunological Medical History: Negative for: Lupus Erythematosus; Raynauds; Scleroderma Integumentary (Skin) Medical History: Negative for: History of Burn Musculoskeletal Medical History: Positive for: Osteoarthritis; Osteomyelitis Neurologic Medical History: Positive for: Neuropathy Oncologic Medical History: Positive for: Received Radiation Negative for: Received Chemotherapy Immunizations Pneumococcal Vaccine: Received Pneumococcal Vaccination: Yes Received Pneumococcal Vaccination On or After 60th Birthday: No Immunization Notes: states being up to date on all - unknown dates for immuization Implantable Devices None Hospitalization / Surgery History Type of Hospitalization/Surgery infected wound (pressure) 2022 (R) BKA amp Oct 2021 Family and Social History Cancer: Yes - Father; Diabetes: Yes - Father; Heart Disease: Yes - Maternal Grandparents; Hereditary Spherocytosis: No; Hypertension: Yes - Mother; Kidney Disease: No; Lung Disease: No; Seizures: No; Stroke: No; Thyroid Problems: Yes - Mother,Siblings; Tuberculosis: No; Former smoker - from teens to 47's; Marital Status - Married; Alcohol Use: Rarely - beer; Drug Use: No History; Caffeine Use: Daily - coffee; Financial Concerns: No; Food, Clothing or Shelter Needs: No; Support System Lacking: No;  Transportation Concerns: No Electronic Signature(s) Signed: 09/07/2021 5:02:16 PM By: [name] DO Signed: 09/08/2021 4:34:23 PM By: [name] Entered By: [name] on 09/07/2021 16:57:47 -------------------------------------------------------------------------------- SuperBill Details Patient Name: Date of  Service: CHARNAY, SHUEY 09/07/2021 Medical Record Number: [id number] Patient Account Number: [id number] Date of Birth/Sex: Treating RN: 16-Apr-1961 (60 y.o. Elam Dutch Primary Care Provider: [name] Other Clinician: Referring Provider: Treating Provider/Extender: [name]: 11 Diagnosis Coding ICD-10 Codes Code Description [phone] Pressure ulcer of sacral region, stage 3 E10.9 Type 1 diabetes mellitus without complications 0000000 Acquired absence of right leg below knee Z94.0 Kidney transplant status Facility Procedures CPT4 Code: [id number] Description: [id number] - WOUND CARE VISIT-LEV 3 EST PT Modifier: Quantity: 1 Physician Procedures Electronic Signature(s) Signed: 09/07/2021 5:02:16 PM By: [name] DO Previous Signature: 09/07/2021 4:50:28 PM Version By: Baruch Gouty RN, BSN Entered By: [name] on 09/07/2021 17:01:45

## 2021-09-10 DIAGNOSIS — I129 Hypertensive chronic kidney disease with stage 1 through stage 4 chronic kidney disease, or unspecified chronic kidney disease: Secondary | ICD-10-CM | POA: Diagnosis not present

## 2021-09-10 DIAGNOSIS — K219 Gastro-esophageal reflux disease without esophagitis: Secondary | ICD-10-CM | POA: Diagnosis not present

## 2021-09-10 DIAGNOSIS — Z7952 Long term (current) use of systemic steroids: Secondary | ICD-10-CM | POA: Diagnosis not present

## 2021-09-10 DIAGNOSIS — E1069 Type 1 diabetes mellitus with other specified complication: Secondary | ICD-10-CM | POA: Diagnosis not present

## 2021-09-10 DIAGNOSIS — F321 Major depressive disorder, single episode, moderate: Secondary | ICD-10-CM | POA: Diagnosis not present

## 2021-09-10 DIAGNOSIS — Z466 Encounter for fitting and adjustment of urinary device: Secondary | ICD-10-CM | POA: Diagnosis not present

## 2021-09-10 DIAGNOSIS — N1831 Chronic kidney disease, stage 3a: Secondary | ICD-10-CM | POA: Diagnosis not present

## 2021-09-10 DIAGNOSIS — Z7982 Long term (current) use of aspirin: Secondary | ICD-10-CM | POA: Diagnosis not present

## 2021-09-10 DIAGNOSIS — E1022 Type 1 diabetes mellitus with diabetic chronic kidney disease: Secondary | ICD-10-CM | POA: Diagnosis not present

## 2021-09-10 DIAGNOSIS — L89522 Pressure ulcer of left ankle, stage 2: Secondary | ICD-10-CM | POA: Diagnosis not present

## 2021-09-10 DIAGNOSIS — C519 Malignant neoplasm of vulva, unspecified: Secondary | ICD-10-CM | POA: Diagnosis not present

## 2021-09-10 DIAGNOSIS — L89154 Pressure ulcer of sacral region, stage 4: Secondary | ICD-10-CM | POA: Diagnosis not present

## 2021-09-10 DIAGNOSIS — D631 Anemia in chronic kidney disease: Secondary | ICD-10-CM | POA: Diagnosis not present

## 2021-09-10 DIAGNOSIS — E785 Hyperlipidemia, unspecified: Secondary | ICD-10-CM | POA: Diagnosis not present

## 2021-09-10 DIAGNOSIS — R339 Retention of urine, unspecified: Secondary | ICD-10-CM | POA: Diagnosis not present

## 2021-09-10 DIAGNOSIS — Z435 Encounter for attention to cystostomy: Secondary | ICD-10-CM | POA: Diagnosis not present

## 2021-09-12 LAB — AEROBIC CULTURE W GRAM STAIN (SUPERFICIAL SPECIMEN)

## 2021-09-14 DIAGNOSIS — Z7982 Long term (current) use of aspirin: Secondary | ICD-10-CM | POA: Diagnosis not present

## 2021-09-14 DIAGNOSIS — E1069 Type 1 diabetes mellitus with other specified complication: Secondary | ICD-10-CM | POA: Diagnosis not present

## 2021-09-14 DIAGNOSIS — N1831 Chronic kidney disease, stage 3a: Secondary | ICD-10-CM | POA: Diagnosis not present

## 2021-09-14 DIAGNOSIS — L89154 Pressure ulcer of sacral region, stage 4: Secondary | ICD-10-CM | POA: Diagnosis not present

## 2021-09-14 DIAGNOSIS — F321 Major depressive disorder, single episode, moderate: Secondary | ICD-10-CM | POA: Diagnosis not present

## 2021-09-14 DIAGNOSIS — K219 Gastro-esophageal reflux disease without esophagitis: Secondary | ICD-10-CM | POA: Diagnosis not present

## 2021-09-14 DIAGNOSIS — Z7952 Long term (current) use of systemic steroids: Secondary | ICD-10-CM | POA: Diagnosis not present

## 2021-09-14 DIAGNOSIS — E1022 Type 1 diabetes mellitus with diabetic chronic kidney disease: Secondary | ICD-10-CM | POA: Diagnosis not present

## 2021-09-14 DIAGNOSIS — I129 Hypertensive chronic kidney disease with stage 1 through stage 4 chronic kidney disease, or unspecified chronic kidney disease: Secondary | ICD-10-CM | POA: Diagnosis not present

## 2021-09-14 DIAGNOSIS — C519 Malignant neoplasm of vulva, unspecified: Secondary | ICD-10-CM | POA: Diagnosis not present

## 2021-09-14 DIAGNOSIS — Z466 Encounter for fitting and adjustment of urinary device: Secondary | ICD-10-CM | POA: Diagnosis not present

## 2021-09-14 DIAGNOSIS — Z435 Encounter for attention to cystostomy: Secondary | ICD-10-CM | POA: Diagnosis not present

## 2021-09-14 DIAGNOSIS — L89522 Pressure ulcer of left ankle, stage 2: Secondary | ICD-10-CM | POA: Diagnosis not present

## 2021-09-14 DIAGNOSIS — E785 Hyperlipidemia, unspecified: Secondary | ICD-10-CM | POA: Diagnosis not present

## 2021-09-14 DIAGNOSIS — R339 Retention of urine, unspecified: Secondary | ICD-10-CM | POA: Diagnosis not present

## 2021-09-14 DIAGNOSIS — D631 Anemia in chronic kidney disease: Secondary | ICD-10-CM | POA: Diagnosis not present

## 2021-09-18 DIAGNOSIS — Z940 Kidney transplant status: Secondary | ICD-10-CM | POA: Diagnosis not present

## 2021-09-18 DIAGNOSIS — Z89511 Acquired absence of right leg below knee: Secondary | ICD-10-CM | POA: Diagnosis not present

## 2021-09-18 DIAGNOSIS — E871 Hypo-osmolality and hyponatremia: Secondary | ICD-10-CM | POA: Diagnosis not present

## 2021-09-18 DIAGNOSIS — D649 Anemia, unspecified: Secondary | ICD-10-CM | POA: Diagnosis not present

## 2021-09-18 DIAGNOSIS — R339 Retention of urine, unspecified: Secondary | ICD-10-CM | POA: Diagnosis not present

## 2021-09-21 ENCOUNTER — Encounter (HOSPITAL_BASED_OUTPATIENT_CLINIC_OR_DEPARTMENT_OTHER): Payer: BC Managed Care – PPO | Admitting: Internal Medicine

## 2021-09-22 DIAGNOSIS — E785 Hyperlipidemia, unspecified: Secondary | ICD-10-CM | POA: Diagnosis not present

## 2021-09-22 DIAGNOSIS — Z79891 Long term (current) use of opiate analgesic: Secondary | ICD-10-CM | POA: Diagnosis not present

## 2021-09-22 DIAGNOSIS — R339 Retention of urine, unspecified: Secondary | ICD-10-CM | POA: Diagnosis not present

## 2021-09-22 DIAGNOSIS — I129 Hypertensive chronic kidney disease with stage 1 through stage 4 chronic kidney disease, or unspecified chronic kidney disease: Secondary | ICD-10-CM | POA: Diagnosis not present

## 2021-09-22 DIAGNOSIS — F321 Major depressive disorder, single episode, moderate: Secondary | ICD-10-CM | POA: Diagnosis not present

## 2021-09-22 DIAGNOSIS — D631 Anemia in chronic kidney disease: Secondary | ICD-10-CM | POA: Diagnosis not present

## 2021-09-22 DIAGNOSIS — N1831 Chronic kidney disease, stage 3a: Secondary | ICD-10-CM | POA: Diagnosis not present

## 2021-09-22 DIAGNOSIS — Z7952 Long term (current) use of systemic steroids: Secondary | ICD-10-CM | POA: Diagnosis not present

## 2021-09-22 DIAGNOSIS — L89154 Pressure ulcer of sacral region, stage 4: Secondary | ICD-10-CM | POA: Diagnosis not present

## 2021-09-22 DIAGNOSIS — C519 Malignant neoplasm of vulva, unspecified: Secondary | ICD-10-CM | POA: Diagnosis not present

## 2021-09-22 DIAGNOSIS — Z466 Encounter for fitting and adjustment of urinary device: Secondary | ICD-10-CM | POA: Diagnosis not present

## 2021-09-22 DIAGNOSIS — Z7982 Long term (current) use of aspirin: Secondary | ICD-10-CM | POA: Diagnosis not present

## 2021-09-22 DIAGNOSIS — E1069 Type 1 diabetes mellitus with other specified complication: Secondary | ICD-10-CM | POA: Diagnosis not present

## 2021-09-22 DIAGNOSIS — Z435 Encounter for attention to cystostomy: Secondary | ICD-10-CM | POA: Diagnosis not present

## 2021-09-22 DIAGNOSIS — K219 Gastro-esophageal reflux disease without esophagitis: Secondary | ICD-10-CM | POA: Diagnosis not present

## 2021-09-22 DIAGNOSIS — E1022 Type 1 diabetes mellitus with diabetic chronic kidney disease: Secondary | ICD-10-CM | POA: Diagnosis not present

## 2021-09-22 DIAGNOSIS — E1065 Type 1 diabetes mellitus with hyperglycemia: Secondary | ICD-10-CM | POA: Diagnosis not present

## 2021-09-25 DIAGNOSIS — N1831 Chronic kidney disease, stage 3a: Secondary | ICD-10-CM | POA: Diagnosis not present

## 2021-09-25 DIAGNOSIS — E785 Hyperlipidemia, unspecified: Secondary | ICD-10-CM | POA: Diagnosis not present

## 2021-09-25 DIAGNOSIS — I10 Essential (primary) hypertension: Secondary | ICD-10-CM | POA: Diagnosis not present

## 2021-09-25 DIAGNOSIS — Z23 Encounter for immunization: Secondary | ICD-10-CM | POA: Diagnosis not present

## 2021-09-25 DIAGNOSIS — E1021 Type 1 diabetes mellitus with diabetic nephropathy: Secondary | ICD-10-CM | POA: Diagnosis not present

## 2021-09-25 DIAGNOSIS — E1069 Type 1 diabetes mellitus with other specified complication: Secondary | ICD-10-CM | POA: Diagnosis not present

## 2021-09-25 DIAGNOSIS — D649 Anemia, unspecified: Secondary | ICD-10-CM | POA: Diagnosis not present

## 2021-09-25 DIAGNOSIS — E782 Mixed hyperlipidemia: Secondary | ICD-10-CM | POA: Diagnosis not present

## 2021-09-28 ENCOUNTER — Encounter (HOSPITAL_BASED_OUTPATIENT_CLINIC_OR_DEPARTMENT_OTHER): Payer: BC Managed Care – PPO | Admitting: Internal Medicine

## 2021-09-28 DIAGNOSIS — L89154 Pressure ulcer of sacral region, stage 4: Secondary | ICD-10-CM | POA: Diagnosis not present

## 2021-09-28 DIAGNOSIS — R339 Retention of urine, unspecified: Secondary | ICD-10-CM | POA: Diagnosis not present

## 2021-09-28 DIAGNOSIS — K219 Gastro-esophageal reflux disease without esophagitis: Secondary | ICD-10-CM | POA: Diagnosis not present

## 2021-09-28 DIAGNOSIS — C519 Malignant neoplasm of vulva, unspecified: Secondary | ICD-10-CM | POA: Diagnosis not present

## 2021-09-28 DIAGNOSIS — E1069 Type 1 diabetes mellitus with other specified complication: Secondary | ICD-10-CM | POA: Diagnosis not present

## 2021-09-28 DIAGNOSIS — F321 Major depressive disorder, single episode, moderate: Secondary | ICD-10-CM | POA: Diagnosis not present

## 2021-09-28 DIAGNOSIS — Z79891 Long term (current) use of opiate analgesic: Secondary | ICD-10-CM | POA: Diagnosis not present

## 2021-09-28 DIAGNOSIS — Z7952 Long term (current) use of systemic steroids: Secondary | ICD-10-CM | POA: Diagnosis not present

## 2021-09-28 DIAGNOSIS — E1022 Type 1 diabetes mellitus with diabetic chronic kidney disease: Secondary | ICD-10-CM | POA: Diagnosis not present

## 2021-09-28 DIAGNOSIS — Z7982 Long term (current) use of aspirin: Secondary | ICD-10-CM | POA: Diagnosis not present

## 2021-09-28 DIAGNOSIS — D631 Anemia in chronic kidney disease: Secondary | ICD-10-CM | POA: Diagnosis not present

## 2021-09-28 DIAGNOSIS — N1831 Chronic kidney disease, stage 3a: Secondary | ICD-10-CM | POA: Diagnosis not present

## 2021-09-28 DIAGNOSIS — I129 Hypertensive chronic kidney disease with stage 1 through stage 4 chronic kidney disease, or unspecified chronic kidney disease: Secondary | ICD-10-CM | POA: Diagnosis not present

## 2021-09-28 DIAGNOSIS — Z435 Encounter for attention to cystostomy: Secondary | ICD-10-CM | POA: Diagnosis not present

## 2021-09-28 DIAGNOSIS — Z466 Encounter for fitting and adjustment of urinary device: Secondary | ICD-10-CM | POA: Diagnosis not present

## 2021-09-28 DIAGNOSIS — E785 Hyperlipidemia, unspecified: Secondary | ICD-10-CM | POA: Diagnosis not present

## 2021-09-30 ENCOUNTER — Ambulatory Visit: Payer: BC Managed Care – PPO | Admitting: Infectious Disease

## 2021-09-30 DIAGNOSIS — Z79891 Long term (current) use of opiate analgesic: Secondary | ICD-10-CM | POA: Diagnosis not present

## 2021-09-30 DIAGNOSIS — L89154 Pressure ulcer of sacral region, stage 4: Secondary | ICD-10-CM | POA: Diagnosis not present

## 2021-09-30 DIAGNOSIS — E1022 Type 1 diabetes mellitus with diabetic chronic kidney disease: Secondary | ICD-10-CM | POA: Diagnosis not present

## 2021-09-30 DIAGNOSIS — Z435 Encounter for attention to cystostomy: Secondary | ICD-10-CM | POA: Diagnosis not present

## 2021-09-30 DIAGNOSIS — E1069 Type 1 diabetes mellitus with other specified complication: Secondary | ICD-10-CM | POA: Diagnosis not present

## 2021-09-30 DIAGNOSIS — C519 Malignant neoplasm of vulva, unspecified: Secondary | ICD-10-CM | POA: Diagnosis not present

## 2021-09-30 DIAGNOSIS — E785 Hyperlipidemia, unspecified: Secondary | ICD-10-CM | POA: Diagnosis not present

## 2021-09-30 DIAGNOSIS — D631 Anemia in chronic kidney disease: Secondary | ICD-10-CM | POA: Diagnosis not present

## 2021-09-30 DIAGNOSIS — Z466 Encounter for fitting and adjustment of urinary device: Secondary | ICD-10-CM | POA: Diagnosis not present

## 2021-09-30 DIAGNOSIS — Z7952 Long term (current) use of systemic steroids: Secondary | ICD-10-CM | POA: Diagnosis not present

## 2021-09-30 DIAGNOSIS — F321 Major depressive disorder, single episode, moderate: Secondary | ICD-10-CM | POA: Diagnosis not present

## 2021-09-30 DIAGNOSIS — N1831 Chronic kidney disease, stage 3a: Secondary | ICD-10-CM | POA: Diagnosis not present

## 2021-09-30 DIAGNOSIS — I129 Hypertensive chronic kidney disease with stage 1 through stage 4 chronic kidney disease, or unspecified chronic kidney disease: Secondary | ICD-10-CM | POA: Diagnosis not present

## 2021-09-30 DIAGNOSIS — Z7982 Long term (current) use of aspirin: Secondary | ICD-10-CM | POA: Diagnosis not present

## 2021-09-30 DIAGNOSIS — R339 Retention of urine, unspecified: Secondary | ICD-10-CM | POA: Diagnosis not present

## 2021-09-30 DIAGNOSIS — K219 Gastro-esophageal reflux disease without esophagitis: Secondary | ICD-10-CM | POA: Diagnosis not present

## 2021-10-01 ENCOUNTER — Encounter (HOSPITAL_BASED_OUTPATIENT_CLINIC_OR_DEPARTMENT_OTHER): Payer: BC Managed Care – PPO | Attending: Internal Medicine | Admitting: Internal Medicine

## 2021-10-01 ENCOUNTER — Other Ambulatory Visit: Payer: Self-pay

## 2021-10-01 DIAGNOSIS — Z89511 Acquired absence of right leg below knee: Secondary | ICD-10-CM | POA: Insufficient documentation

## 2021-10-01 DIAGNOSIS — E109 Type 1 diabetes mellitus without complications: Secondary | ICD-10-CM

## 2021-10-01 DIAGNOSIS — L89153 Pressure ulcer of sacral region, stage 3: Secondary | ICD-10-CM | POA: Insufficient documentation

## 2021-10-01 DIAGNOSIS — Z940 Kidney transplant status: Secondary | ICD-10-CM | POA: Insufficient documentation

## 2021-10-01 DIAGNOSIS — L89159 Pressure ulcer of sacral region, unspecified stage: Secondary | ICD-10-CM | POA: Diagnosis not present

## 2021-10-01 DIAGNOSIS — E1040 Type 1 diabetes mellitus with diabetic neuropathy, unspecified: Secondary | ICD-10-CM | POA: Insufficient documentation

## 2021-10-01 DIAGNOSIS — Z87891 Personal history of nicotine dependence: Secondary | ICD-10-CM | POA: Diagnosis not present

## 2021-10-04 ENCOUNTER — Other Ambulatory Visit: Payer: Self-pay | Admitting: Infectious Disease

## 2021-10-04 ENCOUNTER — Other Ambulatory Visit (INDEPENDENT_AMBULATORY_CARE_PROVIDER_SITE_OTHER): Payer: Self-pay | Admitting: Otolaryngology

## 2021-10-05 DIAGNOSIS — F321 Major depressive disorder, single episode, moderate: Secondary | ICD-10-CM | POA: Diagnosis not present

## 2021-10-05 DIAGNOSIS — E1069 Type 1 diabetes mellitus with other specified complication: Secondary | ICD-10-CM | POA: Diagnosis not present

## 2021-10-05 DIAGNOSIS — Z7952 Long term (current) use of systemic steroids: Secondary | ICD-10-CM | POA: Diagnosis not present

## 2021-10-05 DIAGNOSIS — L89154 Pressure ulcer of sacral region, stage 4: Secondary | ICD-10-CM | POA: Diagnosis not present

## 2021-10-05 DIAGNOSIS — N1831 Chronic kidney disease, stage 3a: Secondary | ICD-10-CM | POA: Diagnosis not present

## 2021-10-05 DIAGNOSIS — E785 Hyperlipidemia, unspecified: Secondary | ICD-10-CM | POA: Diagnosis not present

## 2021-10-05 DIAGNOSIS — C519 Malignant neoplasm of vulva, unspecified: Secondary | ICD-10-CM | POA: Diagnosis not present

## 2021-10-05 DIAGNOSIS — Z466 Encounter for fitting and adjustment of urinary device: Secondary | ICD-10-CM | POA: Diagnosis not present

## 2021-10-05 DIAGNOSIS — E1022 Type 1 diabetes mellitus with diabetic chronic kidney disease: Secondary | ICD-10-CM | POA: Diagnosis not present

## 2021-10-05 DIAGNOSIS — R339 Retention of urine, unspecified: Secondary | ICD-10-CM | POA: Diagnosis not present

## 2021-10-05 DIAGNOSIS — D631 Anemia in chronic kidney disease: Secondary | ICD-10-CM | POA: Diagnosis not present

## 2021-10-05 DIAGNOSIS — Z79891 Long term (current) use of opiate analgesic: Secondary | ICD-10-CM | POA: Diagnosis not present

## 2021-10-05 DIAGNOSIS — Z435 Encounter for attention to cystostomy: Secondary | ICD-10-CM | POA: Diagnosis not present

## 2021-10-05 DIAGNOSIS — I129 Hypertensive chronic kidney disease with stage 1 through stage 4 chronic kidney disease, or unspecified chronic kidney disease: Secondary | ICD-10-CM | POA: Diagnosis not present

## 2021-10-05 DIAGNOSIS — K219 Gastro-esophageal reflux disease without esophagitis: Secondary | ICD-10-CM | POA: Diagnosis not present

## 2021-10-05 DIAGNOSIS — Z7982 Long term (current) use of aspirin: Secondary | ICD-10-CM | POA: Diagnosis not present

## 2021-10-05 NOTE — Telephone Encounter (Signed)
Please advise on refill. Pt no showed follow up appt.

## 2021-10-06 NOTE — Progress Notes (Signed)
JEM, HENSHAW ([id number]) Visit Report for 10/01/2021 Chief Complaint Document Details Patient Name: Date of Service: [name], [name] 10/01/2021 3:15 PM Medical Record Number: [id number] Patient Account Number: [id number] Date of Birth/Sex: Treating RN: [birth year] (60 y.o. [name], [name] Primary Care Provider: Melissa Lopez Other Clinician: Referring Provider: Treating Provider/Extender: [name] [name]: 14 Information Obtained from: Patient Chief Complaint Sacral ulcer Electronic Signature(s) Signed: 10/01/2021 4:46:18 PM By: [name] DO Entered By: [name] on 10/01/2021 16:39:45 -------------------------------------------------------------------------------- HPI Details Patient Name: Date of Service: [name], [name] 10/01/2021 3:15 PM Medical Record Number: [id number] Patient Account Number: [id number] Date of Birth/Sex: Treating RN: 1961-08-09 (60 y.o. [name] Primary Care Provider: Melissa Lopez Other Clinician: Referring Provider: Treating Provider/Extender: [name] [name]: 14 History of Present Illness HPI Description: Admission 6/24 [name] is a 60 year old female with a past medical history of type 1 diabetes, right BKA, and kidney transplant that presents to the clinic for an 5-monthhistory of sacral ulcer. She has been using wet-to-dry dressings up until 5 weeks ago when she was started on a wound VAC. 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 She has completed 6 weeks of treatment and states she has been discharged by infectious disease. She currently denies any issues to the wound area. She reports minimal pain. She denies signs of infection. 7/11; patient presents for 2-week follow-up. She uses a wound VAC that is changed with home health 3 times a week. She denies any signs of infection.  She has no complaints or issues today. 8/1; patient presents for follow-up. She continues to use a wound VAC and this is changed with home health. She reports pain to the sacrum that started 1 week ago. She denies systemic signs of infection. 8/15; patient presents for 2-week follow-up. She has been using wet-to-dry dressings daily. She reports improvement in the wound size and appearance. She denies infectious symptoms. 9/12; patient presents for follow-up. She has been using wet-to-dry Dakin's moistened gauze. She reports following up with infectious disease and is currently taking amoxicillin. 10/6; patient presents for follow-up. She has been using Dakin's wet-to-dry moistened gauze dressings. She is still taking amoxicillin. She canceled her appointment with infectious disease yesterday because she is tired of going to doctors appointments. She has not picked up gentamicin cream from the pharmacy to use on the wound bed. She currently denies signs of infection. Electronic Signature(s) Signed: 10/01/2021 4:46:18 PM By: HKalman ShanDO Entered By: HKalman Shanon 10/01/2021 16:41:33 -------------------------------------------------------------------------------- Physical Exam Details Patient Name: Date of Service: MTANNY, MITCHELTREE10/05/2021 3:15 PM Medical Record Number: [id number] Account Number: [id number] of Birth/Sex: Treating RN: [birth year](60y.o. FBenjaman LobePrimary Care Provider: MMelissa MontaneOther Clinician: Referring Provider: Treating Provider/Extender: HHoyt KochWeeks in Treatment: 14 Constitutional respirations regular, non-labored and within target range for patient..Marland KitchenPsychiatric pleasant and cooperative. Notes Sacral region: Large open wound with pale granulation tissue present. Undermining circumferentially. mild skin irritation circumferentially. No signs of infection on exam. Electronic  Signature(s) Signed: 10/01/2021 4:46:18 PM By: HKalman ShanDO Entered By: HKalman Shanon 10/01/2021 16:42:35 -------------------------------------------------------------------------------- Physician Orders Details Patient Name: Date of Service: MAYNSLEE, VENUS10/05/2021 3:15 PM Medical Record Number: [id number] Account Number: [id number] of Birth/Sex: Treating RN: 108-07-1961(60y.o. FSue LushPrimary Care Provider: MMelissa MontaneOther Clinician: Referring Provider: Treating Provider/Extender: HHoyt Lopez  [name]: 14 Verbal / Phone Orders: No Diagnosis Coding ICD-10 Coding Code Description L89.153 Pressure ulcer of sacral region, stage 3 E10.9 Type 1 diabetes mellitus without complications 0000000 Acquired absence of right leg below knee Z94.0 Kidney transplant status Follow-up Appointments ppointment in 2 weeks. - Dr. Heber [place] Return A Bathing/ Shower/ Hygiene Do not shower or bathe in tub. Off-Loading Turn and reposition every 2 hours Additional Orders / Instructions Follow Nutritious Diet Home Health No change in wound care orders this week; continue Home Health for wound care. May utilize formulary equivalent dressing for wound treatment orders unless otherwise specified. Dressing changes to be completed by [place] on Monday / Wednesday / Friday except when patient has scheduled visit at [facility]. Other Home Health Orders/Instructions: - [place] [facility] Therapy Evaluate for HBO Therapy - Patient to think about HBO treatment Wound Treatment Wound #1 - Sacrum Cleanser: Wound Cleanser ([place]) 1 x Per Day/30 Days Discharge Instructions: Cleanse the wound with wound cleanser prior to applying a clean dressing using gauze sponges, not tissue or cotton balls. Peri-Wound Care: Ketoconazole Cream 2% 1 x Per Day/30 Days Discharge Instructions: Apply Ketoconazole in  clinic Peri-Wound Care: Zinc Oxide Ointment 30g tube 1 x Per Day/30 Days Discharge Instructions: Apply Zinc Oxide or desitin to periwound with each dressing change Topical: Gentamicin 1 x Per Day/30 Days Discharge Instructions: thin layer to wound bed in clinic only Prim Dressing: Dakin's Solution 0.125%, 16 (oz) (Home Health) 1 x Per Day/30 Days ary Discharge Instructions: Moisten gauze with Dakin's solution and pack lightly into wound Secondary Dressing: Woven Gauze Sponge, Non-Sterile 4x4 in (Home Health) 1 x Per Day/30 Days Discharge Instructions: Apply over primary dressing as directed. Secondary Dressing: Zetuvit Plus Silicone Border Dressing 7x7(in/in) (Home Health) 1 x Per Day/30 Days Discharge Instructions: Apply silicone border over primary dressing as directed. Electronic Signature(s) Signed: 10/01/2021 4:46:18 PM By: [name] DO Entered By: [name] on 10/01/2021 16:42:57 -------------------------------------------------------------------------------- Problem List Details Patient Name: Date of Service: [name], [name] 10/01/2021 3:15 PM Medical Record Number: [id number] Patient Account Number: [id number] Date of Birth/Sex: Treating RN: Mar 31, 1961 (60 y.o. [name] Primary Care Provider: Melissa Lopez Other Clinician: Referring Provider: Treating Provider/Extender: [name] [name]: 14 Active Problems ICD-10 Encounter Code Description Active Date MDM Diagnosis L89.153 Pressure ulcer of sacral region, stage 3 06/19/2021 No Yes E10.9 Type 1 diabetes mellitus without complications [id number] No Yes Z89.511 Acquired absence of right leg below knee 06/19/2021 No Yes Z94.0 Kidney transplant status 06/19/2021 No Yes Inactive Problems Resolved Problems Electronic Signature(s) Signed: 10/01/2021 4:46:18 PM By: [name] DO Entered By: [name] on 10/01/2021  16:39:08 -------------------------------------------------------------------------------- Progress Note Details Patient Name: Date of Service: [name], [name] 10/01/2021 3:15 PM Medical Record Number: [id number] Patient Account Number: [id number] Date of Birth/Sex: Treating RN: 1961/02/15 (60 y.o. [name] Primary Care Provider: Melissa Lopez Other Clinician: Referring Provider: Treating Provider/Extender: [name] in Treatment: 14 Subjective Chief Complaint Information obtained from Patient Sacral ulcer History of Present Illness (HPI) Admission 6/24 [name] is a 60 year old female with a past medical history of type 1 diabetes, right BKA, and kidney transplant that presents to the clinic for an 45-monthhistory of sacral ulcer. She has been using wet-to-dry dressings up until 5 weeks ago when she was started on a wound VAC. 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  She has completed 6 weeks of treatment and states she has been discharged by infectious disease. She currently denies any issues to the wound area. She reports minimal pain. She denies signs of infection. 7/11; patient presents for 2-week follow-up. She uses a wound VAC that is changed with home health 3 times a week. She denies any signs of infection. She has no complaints or issues today. 8/1; patient presents for follow-up. She continues to use a wound VAC and this is changed with home health. She reports pain to the sacrum that started 1 week ago. She denies systemic signs of infection. 8/15; patient presents for 2-week follow-up. She has been using wet-to-dry dressings daily. She reports improvement in the wound size and appearance. She denies infectious symptoms. 9/12; patient presents for follow-up. She has been using wet-to-dry Dakin's moistened gauze. She reports following up with infectious disease and is  currently taking amoxicillin. 10/6; patient presents for follow-up. She has been using Dakin's wet-to-dry moistened gauze dressings. She is still taking amoxicillin. She canceled her appointment with infectious disease yesterday because she is tired of going to doctors appointments. She has not picked up gentamicin cream from the pharmacy to use on the wound bed. She currently denies signs of infection. Patient History Information obtained from Patient. Family History Cancer - Father, Diabetes - Father, Heart Disease - Maternal Grandparents, Hypertension - Mother, Thyroid Problems - Mother,Siblings, No family history of Hereditary Spherocytosis, Kidney Disease, Lung Disease, Seizures, Stroke, Tuberculosis. Social History Former smoker - from teens to 62's, Marital Status - Married, Alcohol Use - Rarely - beer, Drug Use - No History, Caffeine Use - Daily - coffee. Medical History Hematologic/Lymphatic Patient has history of Anemia Endocrine Patient has history of Type I Diabetes - s Genitourinary Patient has history of End Stage Renal Disease - off dialysis since transplants Immunological Denies history of Lupus Erythematosus, Raynaudoos, Scleroderma Integumentary (Skin) Denies history of History of Burn Musculoskeletal Patient has history of Osteoarthritis, Osteomyelitis Neurologic Patient has history of Neuropathy Oncologic Patient has history of Received Radiation Denies history of Received Chemotherapy Hospitalization/Surgery History - infected wound (pressure) 2022. - (R) BKA amp Oct 2021. Objective Constitutional respirations regular, non-labored and within target range for patient.. Vitals Time Taken: 4:17 PM, Height: 53 in, Weight: 110 lbs, BMI: 27.5, Temperature: 98.2 F, Pulse: 78 bpm, Respiratory Rate: 18 breaths/min, Blood Pressure: 120/67 mmHg, Capillary Blood Glucose: 90 mg/dl. Psychiatric pleasant and cooperative. General Notes: Sacral region: Large open wound  with pale granulation tissue present. Undermining circumferentially. mild skin irritation circumferentially. No signs of infection on exam. Integumentary (Hair, Skin) Wound #1 status is Open. Original cause of wound was Pressure Injury. The date acquired was: 02/24/2021. The wound has been in treatment 14 weeks. The wound is located on the Sacrum. The wound measures 4.4cm length x 3.5cm width x 0.7cm depth; 12.095cm^2 area and 8.467cm^3 volume. There is Fat Layer (Subcutaneous Tissue) exposed. There is undermining starting at 12:00 and ending at 5:00 with a maximum distance of 1.3cm. There is a medium amount of purulent drainage noted. The wound margin is epibole. There is large (67-100%) red, pink granulation within the wound bed. There is no necrotic tissue within the wound bed. Assessment Active Problems ICD-10 Pressure ulcer of sacral region, stage 3 Type 1 diabetes mellitus without complications Acquired absence of right leg below knee Kidney transplant status Patient's wound has shown slight improvement in appearance and size since last clinic visit. I recommended continuing Dakin's wet-to-dry dressings. 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 I think patient would benefit from topical ointment. This was sent to the pharmacy last month. I recommended she start using this. Also recommended she follow-up with infectious disease, Dr. Tommy Medal for follow-up of antibiotic regiment. Plan Follow-up Appointments: Return Appointment in 2 weeks. - Dr. Heber [place] Bathing/ Shower/ Hygiene: Do not shower or bathe in tub. Off-Loading: Turn and reposition every 2 hours Additional Orders / Instructions: Follow Nutritious Diet Home Health: No change in wound care orders this week; continue Home Health for wound care. May utilize formulary equivalent dressing for wound treatment orders unless otherwise specified. Dressing changes to be completed by [place] on  Monday / Wednesday / Friday except when patient has scheduled visit at [facility]. Other Home Health Orders/Instructions: - [place] [facility] Therapy: Evaluate for HBO Therapy - Patient to think about HBO treatment WOUND #1: - Sacrum Wound Laterality: Cleanser: Wound Cleanser ([place]) 1 x Per Day/30 Days Discharge Instructions: Cleanse the wound with wound cleanser prior to applying a clean dressing using gauze sponges, not tissue or cotton balls. Peri-Wound Care: Ketoconazole Cream 2% 1 x Per Day/30 Days Discharge Instructions: Apply Ketoconazole in clinic Peri-Wound Care: Zinc Oxide Ointment 30g tube 1 x Per Day/30 Days Discharge Instructions: Apply Zinc Oxide or desitin to periwound with each dressing change Topical: Gentamicin 1 x Per Day/30 Days Discharge Instructions: thin layer to wound bed in clinic only Prim Dressing: Dakin's Solution 0.125%, 16 (oz) (Home Health) 1 x Per Day/30 Days ary Discharge Instructions: Moisten gauze with Dakin's solution and pack lightly into wound Secondary Dressing: Woven Gauze Sponge, Non-Sterile 4x4 in (Home Health) 1 x Per Day/30 Days Discharge Instructions: Apply over primary dressing as directed. Secondary Dressing: Zetuvit Plus Silicone Border Dressing 7x7(in/in) (Home Health) 1 x Per Day/30 Days Discharge Instructions: Apply silicone border over primary dressing as directed. 1. Dakin's wet-to-dry dressings 2. Gentamicin ointment 3. Follow-up in 2 weeks 4. Aggressive offloading Electronic Signature(s) Signed: 10/01/2021 4:46:18 PM By: [name] DO Entered By: [name] on 10/01/2021 16:44:33 -------------------------------------------------------------------------------- HxROS Details Patient Name: Date of Service: [name], [name] 10/01/2021 3:15 PM Medical Record Number: [id number] Patient Account Number: [id number] Date of Birth/Sex: Treating RN: 1961/02/07 (60 y.o. [name],  [name] Primary Care Provider: Melissa Lopez Other Clinician: Referring Provider: Treating Provider/Extender: [name] in Treatment: 14 Information Obtained From Patient Hematologic/Lymphatic Medical History: Positive for: Anemia Endocrine Medical History: Positive for: Type I Diabetes - s Time with diabetes: 14 years Treated with: Insulin Blood sugar tested every day: Yes Tested : constant with device Genitourinary Medical History: Positive for: End Stage Renal Disease - off dialysis since transplants Immunological Medical History: Negative for: Lupus Erythematosus; Raynauds; Scleroderma Integumentary (Skin) Medical History: Negative for: History of Burn Musculoskeletal Medical History: Positive for: Osteoarthritis; Osteomyelitis Neurologic Medical History: Positive for: Neuropathy Oncologic Medical History: Positive for: Received Radiation Negative for: Received Chemotherapy Immunizations Pneumococcal Vaccine: Received Pneumococcal Vaccination: Yes Received Pneumococcal Vaccination On or After 60th Birthday: No Immunization Notes: states being up to date on all - unknown dates for immuization Implantable Devices None Hospitalization / Surgery History Type of Hospitalization/Surgery infected wound (pressure) 2022 (R) BKA amp Oct 2021 Family and Social History Cancer: Yes - Father; Diabetes: Yes - Father; Heart Disease: Yes - Maternal Grandparents; Hereditary Spherocytosis: No; Hypertension: Yes - Mother; Kidney Disease: No; Lung Disease: No; Seizures: No; Stroke: No; Thyroid Problems: Yes - Mother,Siblings; Tuberculosis: No; Former smoker -  from teens to 34's; Marital Status - Married; Alcohol Use: Rarely - beer; Drug Use: No History; Caffeine Use: Daily - coffee; Financial Concerns: No; Food, Clothing or Shelter Needs: No; Support System Lacking: No; Transportation Concerns: No Electronic Signature(s) Signed: 10/01/2021 4:46:18  PM By: [name] DO Signed: 10/06/2021 5:02:53 PM By: Rhae Hammock RN Entered By: [name] on 10/01/2021 16:41:41 -------------------------------------------------------------------------------- SuperBill Details Patient Name: Date of Service: [name], [name] 10/01/2021 Medical Record Number: [id number] Patient Account Number: [id number] Date of Birth/Sex: Treating RN: [birth year] (60 y.o. [name] Primary Care Provider: Melissa Lopez Other Clinician: Referring Provider: Treating Provider/Extender: [name] [name]: 14 Diagnosis Coding ICD-10 Codes Code Description [phone] Pressure ulcer of sacral region, stage 3 E10.9 Type 1 diabetes mellitus without complications 0000000 Acquired absence of right leg below knee Z94.0 Kidney transplant status Facility Procedures CPT4 Code: [id number] Description: 99213 - WOUND CARE VISIT-LEV 3 EST PT Modifier: Quantity: 1 Physician Procedures : CPT4 Code Description Modifier [id number] - WC PHYS LEVEL 3 - EST PT ICD-10 Diagnosis Description L89.153 Pressure ulcer of sacral region, stage 3 E10.9 Type 1 diabetes mellitus without complications 0000000 Acquired absence of right leg below knee  Z94.0 Kidney transplant status Quantity: 1 Electronic Signature(s) Signed: 10/01/2021 4:46:18 PM By: [name] DO Entered By: [name] on 10/01/2021 16:44:45

## 2021-10-12 DIAGNOSIS — K219 Gastro-esophageal reflux disease without esophagitis: Secondary | ICD-10-CM | POA: Diagnosis not present

## 2021-10-12 DIAGNOSIS — L89154 Pressure ulcer of sacral region, stage 4: Secondary | ICD-10-CM | POA: Diagnosis not present

## 2021-10-12 DIAGNOSIS — D631 Anemia in chronic kidney disease: Secondary | ICD-10-CM | POA: Diagnosis not present

## 2021-10-12 DIAGNOSIS — F321 Major depressive disorder, single episode, moderate: Secondary | ICD-10-CM | POA: Diagnosis not present

## 2021-10-12 DIAGNOSIS — E785 Hyperlipidemia, unspecified: Secondary | ICD-10-CM | POA: Diagnosis not present

## 2021-10-12 DIAGNOSIS — R339 Retention of urine, unspecified: Secondary | ICD-10-CM | POA: Diagnosis not present

## 2021-10-12 DIAGNOSIS — Z79891 Long term (current) use of opiate analgesic: Secondary | ICD-10-CM | POA: Diagnosis not present

## 2021-10-12 DIAGNOSIS — Z7982 Long term (current) use of aspirin: Secondary | ICD-10-CM | POA: Diagnosis not present

## 2021-10-12 DIAGNOSIS — C519 Malignant neoplasm of vulva, unspecified: Secondary | ICD-10-CM | POA: Diagnosis not present

## 2021-10-12 DIAGNOSIS — Z435 Encounter for attention to cystostomy: Secondary | ICD-10-CM | POA: Diagnosis not present

## 2021-10-12 DIAGNOSIS — Z7952 Long term (current) use of systemic steroids: Secondary | ICD-10-CM | POA: Diagnosis not present

## 2021-10-12 DIAGNOSIS — I129 Hypertensive chronic kidney disease with stage 1 through stage 4 chronic kidney disease, or unspecified chronic kidney disease: Secondary | ICD-10-CM | POA: Diagnosis not present

## 2021-10-12 DIAGNOSIS — N1831 Chronic kidney disease, stage 3a: Secondary | ICD-10-CM | POA: Diagnosis not present

## 2021-10-12 DIAGNOSIS — E1022 Type 1 diabetes mellitus with diabetic chronic kidney disease: Secondary | ICD-10-CM | POA: Diagnosis not present

## 2021-10-12 DIAGNOSIS — Z466 Encounter for fitting and adjustment of urinary device: Secondary | ICD-10-CM | POA: Diagnosis not present

## 2021-10-12 DIAGNOSIS — E1069 Type 1 diabetes mellitus with other specified complication: Secondary | ICD-10-CM | POA: Diagnosis not present

## 2021-10-14 DIAGNOSIS — E1021 Type 1 diabetes mellitus with diabetic nephropathy: Secondary | ICD-10-CM | POA: Diagnosis not present

## 2021-10-15 ENCOUNTER — Encounter (HOSPITAL_BASED_OUTPATIENT_CLINIC_OR_DEPARTMENT_OTHER): Payer: BC Managed Care – PPO | Admitting: Internal Medicine

## 2021-10-16 DIAGNOSIS — E785 Hyperlipidemia, unspecified: Secondary | ICD-10-CM | POA: Diagnosis not present

## 2021-10-16 DIAGNOSIS — E1022 Type 1 diabetes mellitus with diabetic chronic kidney disease: Secondary | ICD-10-CM | POA: Diagnosis not present

## 2021-10-16 DIAGNOSIS — Z7952 Long term (current) use of systemic steroids: Secondary | ICD-10-CM | POA: Diagnosis not present

## 2021-10-16 DIAGNOSIS — Z435 Encounter for attention to cystostomy: Secondary | ICD-10-CM | POA: Diagnosis not present

## 2021-10-16 DIAGNOSIS — L89154 Pressure ulcer of sacral region, stage 4: Secondary | ICD-10-CM | POA: Diagnosis not present

## 2021-10-16 DIAGNOSIS — E1069 Type 1 diabetes mellitus with other specified complication: Secondary | ICD-10-CM | POA: Diagnosis not present

## 2021-10-16 DIAGNOSIS — Z466 Encounter for fitting and adjustment of urinary device: Secondary | ICD-10-CM | POA: Diagnosis not present

## 2021-10-16 DIAGNOSIS — Z79891 Long term (current) use of opiate analgesic: Secondary | ICD-10-CM | POA: Diagnosis not present

## 2021-10-16 DIAGNOSIS — K219 Gastro-esophageal reflux disease without esophagitis: Secondary | ICD-10-CM | POA: Diagnosis not present

## 2021-10-16 DIAGNOSIS — Z7982 Long term (current) use of aspirin: Secondary | ICD-10-CM | POA: Diagnosis not present

## 2021-10-16 DIAGNOSIS — N1831 Chronic kidney disease, stage 3a: Secondary | ICD-10-CM | POA: Diagnosis not present

## 2021-10-16 DIAGNOSIS — R339 Retention of urine, unspecified: Secondary | ICD-10-CM | POA: Diagnosis not present

## 2021-10-16 DIAGNOSIS — I129 Hypertensive chronic kidney disease with stage 1 through stage 4 chronic kidney disease, or unspecified chronic kidney disease: Secondary | ICD-10-CM | POA: Diagnosis not present

## 2021-10-16 DIAGNOSIS — D631 Anemia in chronic kidney disease: Secondary | ICD-10-CM | POA: Diagnosis not present

## 2021-10-16 DIAGNOSIS — C519 Malignant neoplasm of vulva, unspecified: Secondary | ICD-10-CM | POA: Diagnosis not present

## 2021-10-16 DIAGNOSIS — F321 Major depressive disorder, single episode, moderate: Secondary | ICD-10-CM | POA: Diagnosis not present

## 2021-10-20 DIAGNOSIS — R339 Retention of urine, unspecified: Secondary | ICD-10-CM | POA: Diagnosis not present

## 2021-10-21 DIAGNOSIS — E1069 Type 1 diabetes mellitus with other specified complication: Secondary | ICD-10-CM | POA: Diagnosis not present

## 2021-10-21 DIAGNOSIS — C519 Malignant neoplasm of vulva, unspecified: Secondary | ICD-10-CM | POA: Diagnosis not present

## 2021-10-21 DIAGNOSIS — K219 Gastro-esophageal reflux disease without esophagitis: Secondary | ICD-10-CM | POA: Diagnosis not present

## 2021-10-21 DIAGNOSIS — E1022 Type 1 diabetes mellitus with diabetic chronic kidney disease: Secondary | ICD-10-CM | POA: Diagnosis not present

## 2021-10-21 DIAGNOSIS — Z7982 Long term (current) use of aspirin: Secondary | ICD-10-CM | POA: Diagnosis not present

## 2021-10-21 DIAGNOSIS — E785 Hyperlipidemia, unspecified: Secondary | ICD-10-CM | POA: Diagnosis not present

## 2021-10-21 DIAGNOSIS — N1831 Chronic kidney disease, stage 3a: Secondary | ICD-10-CM | POA: Diagnosis not present

## 2021-10-21 DIAGNOSIS — I129 Hypertensive chronic kidney disease with stage 1 through stage 4 chronic kidney disease, or unspecified chronic kidney disease: Secondary | ICD-10-CM | POA: Diagnosis not present

## 2021-10-21 DIAGNOSIS — R339 Retention of urine, unspecified: Secondary | ICD-10-CM | POA: Diagnosis not present

## 2021-10-21 DIAGNOSIS — L89154 Pressure ulcer of sacral region, stage 4: Secondary | ICD-10-CM | POA: Diagnosis not present

## 2021-10-21 DIAGNOSIS — F321 Major depressive disorder, single episode, moderate: Secondary | ICD-10-CM | POA: Diagnosis not present

## 2021-10-21 DIAGNOSIS — Z435 Encounter for attention to cystostomy: Secondary | ICD-10-CM | POA: Diagnosis not present

## 2021-10-21 DIAGNOSIS — Z79891 Long term (current) use of opiate analgesic: Secondary | ICD-10-CM | POA: Diagnosis not present

## 2021-10-21 DIAGNOSIS — Z7952 Long term (current) use of systemic steroids: Secondary | ICD-10-CM | POA: Diagnosis not present

## 2021-10-21 DIAGNOSIS — D631 Anemia in chronic kidney disease: Secondary | ICD-10-CM | POA: Diagnosis not present

## 2021-10-21 DIAGNOSIS — Z466 Encounter for fitting and adjustment of urinary device: Secondary | ICD-10-CM | POA: Diagnosis not present

## 2021-10-22 ENCOUNTER — Encounter (HOSPITAL_BASED_OUTPATIENT_CLINIC_OR_DEPARTMENT_OTHER): Payer: BC Managed Care – PPO | Admitting: Internal Medicine

## 2021-10-22 DIAGNOSIS — D0461 Carcinoma in situ of skin of right upper limb, including shoulder: Secondary | ICD-10-CM | POA: Diagnosis not present

## 2021-10-22 DIAGNOSIS — D485 Neoplasm of uncertain behavior of skin: Secondary | ICD-10-CM | POA: Diagnosis not present

## 2021-10-24 ENCOUNTER — Other Ambulatory Visit: Payer: Self-pay | Admitting: Infectious Disease

## 2021-10-26 NOTE — Telephone Encounter (Signed)
Please advise, amoxicillin is not listed as active medication. She has follow up with you 11/14, okay to refill?

## 2021-10-27 DIAGNOSIS — R339 Retention of urine, unspecified: Secondary | ICD-10-CM | POA: Diagnosis not present

## 2021-10-27 DIAGNOSIS — D631 Anemia in chronic kidney disease: Secondary | ICD-10-CM | POA: Diagnosis not present

## 2021-10-27 DIAGNOSIS — Z79891 Long term (current) use of opiate analgesic: Secondary | ICD-10-CM | POA: Diagnosis not present

## 2021-10-27 DIAGNOSIS — L89154 Pressure ulcer of sacral region, stage 4: Secondary | ICD-10-CM | POA: Diagnosis not present

## 2021-10-27 DIAGNOSIS — C519 Malignant neoplasm of vulva, unspecified: Secondary | ICD-10-CM | POA: Diagnosis not present

## 2021-10-27 DIAGNOSIS — F321 Major depressive disorder, single episode, moderate: Secondary | ICD-10-CM | POA: Diagnosis not present

## 2021-10-27 DIAGNOSIS — E1069 Type 1 diabetes mellitus with other specified complication: Secondary | ICD-10-CM | POA: Diagnosis not present

## 2021-10-27 DIAGNOSIS — E1022 Type 1 diabetes mellitus with diabetic chronic kidney disease: Secondary | ICD-10-CM | POA: Diagnosis not present

## 2021-10-27 DIAGNOSIS — I129 Hypertensive chronic kidney disease with stage 1 through stage 4 chronic kidney disease, or unspecified chronic kidney disease: Secondary | ICD-10-CM | POA: Diagnosis not present

## 2021-10-27 DIAGNOSIS — N1831 Chronic kidney disease, stage 3a: Secondary | ICD-10-CM | POA: Diagnosis not present

## 2021-10-27 DIAGNOSIS — Z7982 Long term (current) use of aspirin: Secondary | ICD-10-CM | POA: Diagnosis not present

## 2021-10-27 DIAGNOSIS — Z466 Encounter for fitting and adjustment of urinary device: Secondary | ICD-10-CM | POA: Diagnosis not present

## 2021-10-27 DIAGNOSIS — E785 Hyperlipidemia, unspecified: Secondary | ICD-10-CM | POA: Diagnosis not present

## 2021-10-27 DIAGNOSIS — K219 Gastro-esophageal reflux disease without esophagitis: Secondary | ICD-10-CM | POA: Diagnosis not present

## 2021-10-27 DIAGNOSIS — Z435 Encounter for attention to cystostomy: Secondary | ICD-10-CM | POA: Diagnosis not present

## 2021-10-27 DIAGNOSIS — Z7952 Long term (current) use of systemic steroids: Secondary | ICD-10-CM | POA: Diagnosis not present

## 2021-10-29 ENCOUNTER — Encounter (HOSPITAL_BASED_OUTPATIENT_CLINIC_OR_DEPARTMENT_OTHER): Payer: BC Managed Care – PPO | Attending: Internal Medicine | Admitting: Internal Medicine

## 2021-10-29 ENCOUNTER — Other Ambulatory Visit: Payer: Self-pay

## 2021-10-29 DIAGNOSIS — Z992 Dependence on renal dialysis: Secondary | ICD-10-CM | POA: Insufficient documentation

## 2021-10-29 DIAGNOSIS — Z89511 Acquired absence of right leg below knee: Secondary | ICD-10-CM | POA: Diagnosis not present

## 2021-10-29 DIAGNOSIS — E1022 Type 1 diabetes mellitus with diabetic chronic kidney disease: Secondary | ICD-10-CM | POA: Insufficient documentation

## 2021-10-29 DIAGNOSIS — E1040 Type 1 diabetes mellitus with diabetic neuropathy, unspecified: Secondary | ICD-10-CM | POA: Diagnosis not present

## 2021-10-29 DIAGNOSIS — L89153 Pressure ulcer of sacral region, stage 3: Secondary | ICD-10-CM | POA: Insufficient documentation

## 2021-10-29 DIAGNOSIS — E10622 Type 1 diabetes mellitus with other skin ulcer: Secondary | ICD-10-CM | POA: Insufficient documentation

## 2021-10-29 DIAGNOSIS — E109 Type 1 diabetes mellitus without complications: Secondary | ICD-10-CM | POA: Diagnosis not present

## 2021-10-29 DIAGNOSIS — Z940 Kidney transplant status: Secondary | ICD-10-CM | POA: Diagnosis not present

## 2021-10-29 DIAGNOSIS — N186 End stage renal disease: Secondary | ICD-10-CM | POA: Diagnosis not present

## 2021-10-29 DIAGNOSIS — I120 Hypertensive chronic kidney disease with stage 5 chronic kidney disease or end stage renal disease: Secondary | ICD-10-CM | POA: Insufficient documentation

## 2021-10-29 NOTE — Progress Notes (Addendum)
[name] ([id number]) Visit Report for 10/29/2021 Arrival Information Details Patient Name: Date of Service: [name], [name] 10/29/2021 3:15 PM Medical Record Number: [id number] Patient Account Number: [id number] Date of Birth/Sex: Treating RN: October 14, 1961 (60 y.o. [name] Primary Care Esmerelda Finnigan: [name] Other Clinician: Referring Raynah Gomes: Treating Delsie Amador/Extender: [name] in Treatment: 18 Visit Information History Since Last Visit Added or deleted any medications: No Patient Arrived: Wheel Chair Any new allergies or adverse reactions: No Arrival Time: 16:10 Had a fall or experienced change in No Accompanied By: husband activities of daily living that may affect Transfer Assistance: Manual risk of falls: Patient Identification Verified: Yes Signs or symptoms of abuse/neglect since last visito No Secondary Verification Process Completed: Yes Hospitalized since last visit: No Patient Requires Transmission-Based Precautions: No Implantable device outside of the clinic excluding No Patient Has Alerts: No cellular tissue based products placed in the center since last visit: Has Dressing in Place as Prescribed: Yes Pain Present Now: No Electronic Signature(s) Signed: 10/29/2021 6:31:34 PM By: [name] Entered By: [name] on 10/29/2021 16:11:50 -------------------------------------------------------------------------------- Clinic Level of Care Assessment Details Patient Name: Date of Service: [name] 10/29/2021 3:15 PM Medical Record Number: [id number] Patient Account Number: [id number] Date of Birth/Sex: Treating RN: 04/03/1961 (60 y.o. [name] Primary Care Jasiel Belisle: [name] Other Clinician: Referring Rise Traeger: Treating Amaryah Mallen/Extender: [name] in Treatment: 18 Clinic Level of Care Assessment Items TOOL 4 Quantity Score X- 1 0 Use when only  an EandM is performed on FOLLOW-UP visit ASSESSMENTS - Nursing Assessment / Reassessment X- 1 10 Reassessment of Co-morbidities (includes updates in patient status) X- 1 5 Reassessment of Adherence to Treatment Plan ASSESSMENTS - Wound and Skin A ssessment / Reassessment X - Simple Wound Assessment / Reassessment - one wound 1 5 []  - 0 Complex Wound Assessment / Reassessment - multiple wounds []  - 0 Dermatologic / Skin Assessment (not related to wound area) ASSESSMENTS - Focused Assessment []  - 0 Circumferential Edema Measurements - multi extremities []  - 0 Nutritional Assessment / Counseling / Intervention []  - 0 Lower Extremity Assessment (monofilament, tuning fork, pulses) []  - 0 Peripheral Arterial Disease Assessment (using hand held doppler) ASSESSMENTS - Ostomy and/or Continence Assessment and Care []  - 0 Incontinence Assessment and Management []  - 0 Ostomy Care Assessment and Management (repouching, etc.) PROCESS - Coordination of Care []  - 0 Simple Patient / Family Education for ongoing care X- 1 20 Complex (extensive) Patient / Family Education for ongoing care []  - 0 Staff obtains Programmer, systems, Records, T Results / Process Orders est X- 1 10 Staff telephones HHA, Nursing Homes / Clarify orders / etc []  - 0 Routine Transfer to another Facility (non-emergent condition) []  - 0 Routine Hospital Admission (non-emergent condition) []  - 0 New Admissions / Biomedical engineer / Ordering NPWT Apligraf, etc. , []  - 0 Emergency Hospital Admission (emergent condition) []  - 0 Simple Discharge Coordination []  - 0 Complex (extensive) Discharge Coordination PROCESS - Special Needs []  - 0 Pediatric / Minor Patient Management []  - 0 Isolation Patient Management []  - 0 Hearing / Language / Visual special needs []  - 0 Assessment of Community assistance (transportation, D/C planning, etc.) []  - 0 Additional assistance / Altered mentation []  - 0 Support Surface(s)  Assessment (bed, cushion, seat, etc.) INTERVENTIONS - Wound Cleansing / Measurement X - Simple Wound Cleansing - one wound 1 5 []  - 0 Complex Wound Cleansing - multiple wounds X- 1 5 Wound Imaging (  photographs - any number of wounds) []  - 0 Wound Tracing (instead of photographs) X- 1 5 Simple Wound Measurement - one wound []  - 0 Complex Wound Measurement - multiple wounds INTERVENTIONS - Wound Dressings []  - 0 Small Wound Dressing one or multiple wounds X- 1 15 Medium Wound Dressing one or multiple wounds []  - 0 Large Wound Dressing one or multiple wounds []  - 0 Application of Medications - topical []  - 0 Application of Medications - injection INTERVENTIONS - Miscellaneous []  - 0 External ear exam []  - 0 Specimen Collection (cultures, biopsies, blood, body fluids, etc.) []  - 0 Specimen(s) / Culture(s) sent or taken to Lab for analysis []  - 0 Patient Transfer (multiple staff / Civil Service fast streamer / Similar devices) []  - 0 Simple Staple / Suture removal (25 or less) []  - 0 Complex Staple / Suture removal (26 or more) []  - 0 Hypo / Hyperglycemic Management (close monitor of Blood Glucose) []  - 0 Ankle / Brachial Index (ABI) - do not check if billed separately X- 1 5 Vital Signs Has the patient been seen at the hospital within the last three years: Yes Total Score: 85 Level Of Care: New/Established - Level 3 Electronic Signature(s) Signed: 10/29/2021 6:31:34 PM By: [name] Entered By: [name] on 10/29/2021 17:02:58 -------------------------------------------------------------------------------- Lower Extremity Assessment Details Patient Name: Date of Service: [name] 10/29/2021 3:15 PM Medical Record Number: [id number] Patient Account Number: [id number] Date of Birth/Sex: Treating RN: 1961/03/02 (60 y.o. [name] Primary Care Mckynlee Luse: [name] Other Clinician: Referring Phaedra Colgate: Treating Ralston Venus/Extender: [name]: 18 Electronic Signature(s) Signed: 10/29/2021 6:31:34 PM By: [name] Entered By: [name] on 10/29/2021 16:19:22 -------------------------------------------------------------------------------- Multi Wound Chart Details Patient Name: Date of Service: SHONIA, SKILLING 10/29/2021 3:15 PM Medical Record Number: [id number] Patient Account Number: [id number] Date of Birth/Sex: Treating RN: [birth year] (61 y.o. [name], [name] Primary Care Dwayne Begay: [name] Other Clinician: Referring Cayleigh Paull: Treating Marylyn Appenzeller/Extender: [name]: 18 Vital Signs Height(in): 53 Capillary Blood Glucose(mg/dl): 112 Weight(lbs): 110 Pulse(bpm): 79 Body Mass Index(BMI): 28 Blood Pressure(mmHg): 126/73 Temperature(F): 98.4 Respiratory Rate(breaths/min): 18 Photos: [1:No Photos Sacrum] [N/A:N/A N/A] Wound Location: [1:Pressure Injury] [N/A:N/A] Wounding Event: [1:Pressure Ulcer] [N/A:N/A] Primary Etiology: [1:Anemia, Type I Diabetes, End Stage] [N/A:N/A] Comorbid History: [1:Renal Disease, Osteoarthritis, Osteomyelitis, Neuropathy, Received Radiation 02/24/2021] [N/A:N/A] Date Acquired: [1:18] [N/A:N/A] Weeks of Treatment: [1:Open] [N/A:N/A] Wound Status: [1:4.4x4x0.5] [N/A:N/A] Measurements L x W x D (cm) [1:13.823] [N/A:N/A] A (cm) : rea [1:6.912] [N/A:N/A] Volume (cm) : [1:43.90%] [N/A:N/A] % Reduction in A rea: [1:81.30%] [N/A:N/A] % Reduction in Volume: [1:12] Starting Position 1 (o'clock): [1:3] Ending Position 1 (o'clock): [1:1.2] Maximum Distance 1 (cm): [1:Yes] [N/A:N/A] Undermining: [1:Category/Stage III] [N/A:N/A] Classification: [1:Medium] [N/A:N/A] Exudate A mount: [1:Purulent] [N/A:N/A] Exudate Type: [1:yellow, brown, green] [N/A:N/A] Exudate Color: [1:Epibole] [N/A:N/A] Wound Margin: [1:Large (67-100%)] [N/A:N/A] Granulation A mount: [1:Red, Pink]  [N/A:N/A] Granulation Quality: [1:Small (1-33%)] [N/A:N/A] Necrotic A mount: [1:Fat Layer (Subcutaneous Tissue): Yes N/A] Exposed Structures: [1:Fascia: No Tendon: No Muscle: No Joint: No Bone: No Medium (34-66%)] [N/A:N/A] Treatment Notes Electronic Signature(s) Signed: 10/30/2021 2:33:16 PM By: [name] DO Signed: 11/03/2021 4:45:49 PM By: Rhae Hammock RN Entered By: [name] on 10/30/2021 14:25:32 -------------------------------------------------------------------------------- [place] Details Patient Name: Date of Service: JORDAIN, RADIN 10/29/2021 3:15 PM Medical Record Number: [id number] Patient Account Number: [id number] Date of Birth/Sex: Treating RN: 05/01/1961 (60 y.o. [name] Primary Care Dason Mosley: [name] Other Clinician:  Referring Dunia Pringle: Treating Jerik Falletta/Extender: [name] in Treatment: 18 Active Inactive Wound/Skin Impairment Nursing Diagnoses: Impaired tissue integrity Knowledge deficit related to ulceration/compromised skin integrity Goals: Patient/caregiver will verbalize understanding of skin care regimen Date Initiated: 06/19/2021 Target Resolution Date: 11/26/2021 Goal Status: Active Ulcer/skin breakdown will have a volume reduction of 30% by week 4 Date Initiated: 06/19/2021 Date Inactivated: 07/27/2021 Target Resolution Date: 07/02/2021 Goal Status: Unmet Unmet Reason: pressure relief Interventions: Assess patient/caregiver ability to obtain necessary supplies Assess patient/caregiver ability to perform ulcer/skin care regimen upon admission and as needed Assess ulceration(s) every visit Provide education on ulcer and skin care Notes: Electronic Signature(s) Signed: 10/29/2021 4:27:59 PM By: [name] Entered By: [name] on 10/29/2021 16:27:59 -------------------------------------------------------------------------------- Pain Assessment  Details Patient Name: Date of Service: BAYLIE, DRAKES 10/29/2021 3:15 PM Medical Record Number: [id number] Patient Account Number: [id number] Date of Birth/Sex: Treating RN: Oct 10, 1961 (60 y.o. [name] Primary Care Wilburn Keir: [name] Other Clinician: Referring Trena Dunavan: Treating Kannon Granderson/Extender: [name]: 18 Active Problems Location of Pain Severity and Description of Pain Patient Has Paino No Site Locations Pain Management and Medication Current Pain Management: Electronic Signature(s) Signed: 10/29/2021 6:31:34 PM By: [name] Entered By: [name] on 10/29/2021 16:17:11 -------------------------------------------------------------------------------- Patient/Caregiver Education Details Patient Name: Date of Service: CATHIE, BONNELL 11/3/2022andnbsp3:15 PM Medical Record Number: [id number] Patient Account Number: [id number] Date of Birth/Gender: Treating RN: June 25, 1961 (60 y.o. [name] Primary Care Physician: [name] Other Clinician: Referring Physician: Treating Physician/Extender: [name] in Treatment: 18 Education Assessment Education Provided To: Patient and Caregiver Education Topics Provided Pressure: Methods: Explain/Verbal, Printed Responses: State content correctly Wound/Skin Impairment: Methods: Explain/Verbal, Printed Responses: State content correctly Electronic Signature(s) Signed: 10/29/2021 6:31:34 PM By: [name] Entered By: [name] on 10/29/2021 16:31:53 -------------------------------------------------------------------------------- Wound Assessment Details Patient Name: Date of Service: CORRENA, MEACHAM 10/29/2021 3:15 PM Medical Record Number: [id number] Patient Account Number: [id number] Date of Birth/Sex: Treating RN: 01/25/1961 (60 y.o. [name] Primary Care Meet Weathington: [name] Other Clinician: Referring Borghild Thaker: Treating Josie Mesa/Extender: [name]: 18 Wound Status Wound Number: 1 Primary Pressure Ulcer Etiology: Wound Location: Sacrum Wound Open Wounding Event: Pressure Injury Status: Date Acquired: 02/24/2021 Comorbid Anemia, Type I Diabetes, End Stage Renal Disease, Osteoarthritis, Weeks Of Treatment: 18 History: Osteomyelitis, Neuropathy, Received Radiation Clustered Wound: No Wound Measurements Length: (cm) 4.4 Width: (cm) 4 Depth: (cm) 0.5 Area: (cm) 13.823 Volume: (cm) 6.912 % Reduction in Area: 43.9% % Reduction in Volume: 81.3% Epithelialization: Medium (34-66%) Tunneling: No Undermining: Yes Starting Position (o'clock): 12 Ending Position (o'clock): 3 Maximum Distance: (cm) 1.2 Wound Description Classification: Category/Stage III Wound Margin: Epibole Exudate Amount: Medium Exudate Type: Purulent Exudate Color: yellow, brown, green Foul Odor After Cleansing: No Slough/Fibrino Yes Wound Bed Granulation Amount: Large (67-100%) Exposed Structure Granulation Quality: Red, Pink Fascia Exposed: No Necrotic Amount: Small (1-33%) Fat Layer (Subcutaneous Tissue) Exposed: Yes Necrotic Quality: Adherent Slough Tendon Exposed: No Muscle Exposed: No Joint Exposed: No Bone Exposed: No Electronic Signature(s) Signed: 10/29/2021 6:31:34 PM By: [name] Entered By: [name] on 10/29/2021 16:18:46 -------------------------------------------------------------------------------- Vitals Details Patient Name: Date of Service: CHELSEY, REDONDO 10/29/2021 3:15 PM Medical Record Number: [id number] Patient Account Number: [id number] Date of Birth/Sex: Treating RN: [birth year] (60 y.o. [name] Primary Care Lovenia Debruler: [name] Other Clinician: Referring Melida Northington: Treating Lynore Coscia/Extender: [name]: 18 Vital  Signs Time Taken: 16:13 Temperature (  F): 98.4 Height (in): 53 Pulse (bpm): 79 Weight (lbs): 110 Respiratory Rate (breaths/min): 18 Body Mass Index (BMI): 27.5 Blood Pressure (mmHg): 126/73 Capillary Blood Glucose (mg/dl): 112 Reference Range: 80 - 120 mg / dl Electronic Signature(s) Signed: 10/29/2021 6:31:34 PM By: [name] Entered By: [name] on 10/29/2021 16:14:17

## 2021-10-31 DIAGNOSIS — L89322 Pressure ulcer of left buttock, stage 2: Secondary | ICD-10-CM | POA: Diagnosis not present

## 2021-10-31 DIAGNOSIS — L89154 Pressure ulcer of sacral region, stage 4: Secondary | ICD-10-CM | POA: Diagnosis not present

## 2021-11-03 NOTE — Progress Notes (Signed)
[name], [name] ([id number]) Visit Report for 10/29/2021 Chief Complaint Document Details Patient Name: Date of Service: [name], [name] 10/29/2021 3:15 PM Medical Record Number: [id number] Patient Account Number: [id number] Date of Birth/Sex: Treating RN: [birth year] (60 y.o. [name], [name] Primary Care Provider: Melissa Lopez Other Clinician: Referring Provider: Treating Provider/Extender: [name] in Treatment: 18 Information Obtained from: Patient Chief Complaint Sacral ulcer Electronic Signature(s) Signed: 10/30/2021 2:33:16 PM By: Doris Shan DO Entered By: [name] on 10/30/2021 14:25:40 -------------------------------------------------------------------------------- HPI Details Patient Name: Date of Service: [name], [name] 10/29/2021 3:15 PM Medical Record Number: [id number] Patient Account Number: [id number] Date of Birth/Sex: Treating RN: [name] (60 y.o. [name] Primary Care Provider: Melissa Lopez Other Clinician: Referring Provider: Treating Provider/Extender: [name] [name]: 18 History of Present Illness HPI Description: Admission 6/24 [name] is a 60 year old female with a past medical history of type 1 diabetes, right BKA, and kidney transplant that presents to the clinic for an 61-month history of sacral ulcer. She has been using wet-to-dry dressings up until 5 weeks ago when she was started on a wound VAC. 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 She has completed 6 weeks of treatment and states she has been discharged by infectious disease. She currently denies any issues to the wound area. She reports minimal pain. She denies signs of infection. 7/11; patient presents for 2-week follow-up. She uses a wound VAC that is changed with home health 3 times a week. She denies any signs of infection.  She has no complaints or issues today. 8/1; patient presents for follow-up. She continues to use a wound VAC and this is changed with home health. She reports pain to the sacrum that started 1 week ago. She denies systemic signs of infection. 8/15; patient presents for 2-week follow-up. She has been using wet-to-dry dressings daily. She reports improvement in the wound size and appearance. She denies infectious symptoms. 9/12; patient presents for follow-up. She has been using wet-to-dry Dakin's moistened gauze. She reports following up with infectious disease and is currently taking amoxicillin. 10/6; patient presents for follow-up. She has been using Dakin's wet-to-dry moistened gauze dressings. She is still taking amoxicillin. She canceled her appointment with infectious disease yesterday because she is tired of going to doctors appointments. She has not picked up gentamicin cream from the pharmacy to use on the wound bed. She currently denies signs of infection. 11/3; patient presents for follow-up. She has been using Dakin's wet-to-dry dressings. She has no issues or complaints today. She denies signs of infection. Electronic Signature(s) Signed: 10/30/2021 2:33:16 PM By: Doris Shan DO Entered By: [name] on 10/30/2021 14:27:09 -------------------------------------------------------------------------------- Physical Exam Details Patient Name: Date of Service: [name], [name] 10/29/2021 3:15 PM Medical Record Number: [id number] Patient Account Number: [id number] Date of Birth/Sex: Treating RN: [birth year] (60 y.o. [name] Primary Care Provider: Melissa Lopez Other Clinician: Referring Provider: Treating Provider/Extender: [name] [name]: 18 Constitutional respirations regular, non-labored and within target range for patient.Marland Kitchen Psychiatric pleasant and cooperative. Notes Sacral region: Large open wound with pale  granulation tissue present. Undermining circumferentially. mild skin irritation circumferentially. No signs of infection on exam. Electronic Signature(s) Signed: 10/30/2021 2:33:16 PM By: Doris Shan DO Entered By: [name] on 10/30/2021 14:27:40 -------------------------------------------------------------------------------- Physician Orders Details Patient Name: Date of Service: [name], [name] 10/29/2021 3:15 PM Medical Record Number: [id number] Patient Account Number: [id number] Date of  Birth/Sex: Treating RN: [birth year] (60 y.o. [name] Primary Care Provider: Melissa Lopez Other Clinician: Referring Provider: Treating Provider/Extender: [name] in Treatment: 2 Verbal / Phone Orders: No Diagnosis Coding ICD-10 Coding Code Description L89.153 Pressure ulcer of sacral region, stage 3 E10.9 Type 1 diabetes mellitus without complications E08.144 Acquired absence of right leg below knee Z94.0 Kidney transplant status Follow-up Appointments Return appointment in 3 weeks. - [name] Cellular or Tissue Based Products Cellular or Tissue Based Product Type: - Run IVR for Therask[employer] Bathing/ Shower/ Hygiene Do not shower or bathe in tub. Negative Presssure Wound Therapy Other: - Will consider vac after patient has ID appointment 11/09/21 Off-Loading Turn and reposition every 2 hours Additional Orders / Instructions Follow Nutritious Diet Home Health No change in wound care orders this week; continue Home Health for wound care. May utilize formulary equivalent dressing for wound treatment orders unless otherwise specified. Dressing changes to be completed by [name] on Monday / Wednesday / Friday except when patient has scheduled visit at [facility]. Other Home Health Orders/Instructions: - [place] [facility]ound #1 - Sacrum Cleanser: Wound Cleanser ([place]) 1 x Per Day/30  Days Discharge Instructions: Cleanse the wound with wound cleanser prior to applying a clean dressing using gauze sponges, not tissue or cotton balls. Peri-Wound Care: Ketoconazole Cream 2% 1 x Per Day/30 Days Discharge Instructions: Apply Ketoconazole in clinic Peri-Wound Care: Zinc Oxide Ointment 30g tube 1 x Per Day/30 Days Discharge Instructions: Apply Zinc Oxide or desitin to periwound with each dressing change Prim Dressing: Dakin's Solution 0.125%, 16 (oz) (Home Health) 1 x Per Day/30 Days ary Discharge Instructions: Moisten gauze with Dakin's solution and pack lightly into wound Secondary Dressing: Woven Gauze Sponge, Non-Sterile 4x4 in (Home Health) 1 x Per Day/30 Days Discharge Instructions: Apply over primary dressing as directed. Secondary Dressing: Zetuvit Plus Silicone Border Dressing 7x7(in/in) (Home Health) 1 x Per Day/30 Days Discharge Instructions: Apply silicone border over primary dressing as directed. Electronic Signature(s) Signed: 10/30/2021 2:33:16 PM By: Doris Shan DO Previous Signature: 10/29/2021 6:31:34 PM Version By: [name] Entered By: [name] on 10/30/2021 14:27:56 -------------------------------------------------------------------------------- Problem List Details Patient Name: Date of Service: [name], [name] 10/29/2021 3:15 PM Medical Record Number: [id number] Patient Account Number: [id number] Date of Birth/Sex: Treating RN: [birth year] (60 y.o. [name] Primary Care Provider: Melissa Lopez Other Clinician: Referring Provider: Treating Provider/Extender: [name] [name]: 60 Active Problems ICD-10 Encounter Code Description Active Date MDM Diagnosis E10.622 Type 1 diabetes mellitus with other skin ulcer 10/29/2021 No Yes L89.153 Pressure ulcer of sacral region, stage 3 06/19/2021 No Yes Z89.511 Acquired absence of right leg below knee 06/19/2021 No Yes Z94.0 Kidney transplant  status 06/19/2021 No Yes Inactive Problems Resolved Problems Electronic Signature(s) Signed: 10/30/2021 2:33:16 PM By: Doris Shan DO Previous Signature: 10/29/2021 4:25:11 PM Version By: [name] Entered By: [name] on 10/30/2021 14:32:22 -------------------------------------------------------------------------------- Progress Note Details Patient Name: Date of Service: [name], [name] 10/29/2021 3:15 PM Medical Record Number: [id number] Patient Account Number: [id number] Date of Birth/Sex: Treating RN: [birth year] (60 y.o. [name] Primary Care Provider: Melissa Lopez Other Clinician: Referring Provider: Treating Provider/Extender: [name] in Treatment: 44 Subjective Chief Complaint Information obtained from Patient Sacral ulcer History of Present Illness (HPI) Admission 6/24 Ms. Loda Bialas is a 60 year old female with a past medical history of type 1 diabetes, right BKA, and kidney transplant that presents to  the clinic for an 2-month history of sacral ulcer. She has been using wet-to-dry dressings up until 5 weeks ago when she was started on a wound VAC. 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 She has completed 6 weeks of treatment and states she has been discharged by infectious disease. She currently denies any issues to the wound area. She reports minimal pain. She denies signs of infection. 7/11; patient presents for 2-week follow-up. She uses a wound VAC that is changed with home health 3 times a week. She denies any signs of infection. She has no complaints or issues today. 8/1; patient presents for follow-up. She continues to use a wound VAC and this is changed with home health. She reports pain to the sacrum that started 1 week ago. She denies systemic signs of infection. 8/15; patient presents for 2-week follow-up. She has been using wet-to-dry  dressings daily. She reports improvement in the wound size and appearance. She denies infectious symptoms. 9/12; patient presents for follow-up. She has been using wet-to-dry Dakin's moistened gauze. She reports following up with infectious disease and is currently taking amoxicillin. 10/6; patient presents for follow-up. She has been using Dakin's wet-to-dry moistened gauze dressings. She is still taking amoxicillin. She canceled her appointment with infectious disease yesterday because she is tired of going to doctors appointments. She has not picked up gentamicin cream from the pharmacy to use on the wound bed. She currently denies signs of infection. 11/3; patient presents for follow-up. She has been using Dakin's wet-to-dry dressings. She has no issues or complaints today. She denies signs of infection. Patient History Information obtained from Patient. Family History Cancer - Father, Diabetes - Father, Heart Disease - Maternal Grandparents, Hypertension - Mother, Thyroid Problems - Mother,Siblings, No family history of Hereditary Spherocytosis, Kidney Disease, Lung Disease, Seizures, Stroke, Tuberculosis. Social History Former smoker - from teens to 82's, Marital Status - Married, Alcohol Use - Rarely - beer, Drug Use - No History, Caffeine Use - Daily - coffee. Medical History Hematologic/Lymphatic Patient has history of Anemia Endocrine Patient has history of Type I Diabetes - s Genitourinary Patient has history of End Stage Renal Disease - off dialysis since transplants Immunological Denies history of Lupus Erythematosus, Raynaudoos, Scleroderma Integumentary (Skin) Denies history of History of Burn Musculoskeletal Patient has history of Osteoarthritis, Osteomyelitis Neurologic Patient has history of Neuropathy Oncologic Patient has history of Received Radiation Denies history of Received Chemotherapy Hospitalization/Surgery History - infected wound (pressure) 2022. - (R)  BKA amp Oct 2021. Objective Constitutional respirations regular, non-labored and within target range for patient.. Vitals Time Taken: 4:13 PM, Height: 53 in, Weight: 110 lbs, BMI: 27.5, Temperature: 98.4 F, Pulse: 79 bpm, Respiratory Rate: 18 breaths/min, Blood Pressure: 126/73 mmHg, Capillary Blood Glucose: 112 mg/dl. Psychiatric pleasant and cooperative. General Notes: Sacral region: Large open wound with pale granulation tissue present. Undermining circumferentially. mild skin irritation circumferentially. No signs of infection on exam. Integumentary (Hair, Skin) Wound #1 status is Open. Original cause of wound was Pressure Injury. The date acquired was: 02/24/2021. The wound has been in treatment 18 weeks. The wound is located on the Sacrum. The wound measures 4.4cm length x 4cm width x 0.5cm depth; 13.823cm^2 area and 6.912cm^3 volume. There is Fat Layer (Subcutaneous Tissue) exposed. There is no tunneling noted, however, there is undermining starting at 12:00 and ending at 3:00 with a maximum distance of 1.2cm. There is a medium amount of purulent drainage noted. The wound margin  is epibole. There is large (67-100%) red, pink granulation within the wound bed. There is a small (1-33%) amount of necrotic tissue within the wound bed including Adherent Slough. 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 She has follow-up with Dr. Tommy Medal on 11/14. I would like to start a wound VAC again to see how she does. She does not seem interested in doing this. We will reconsider this after her appointment with ID. In the meantime I recommended trying a skin substitute like TheraSkin. She states she would like to try this. If this does not show improvement she may need plastic surgery referral for a flap. No signs of infection on  exam. Plan Follow-up Appointments: Return appointment in 3 weeks. - Dr. Heber [place] Cellular or Tissue Based Products: Cellular or Tissue Based Product Type: - Run IVR for Theraskin Bathing/ Shower/ Hygiene: Do not shower or bathe in tub. Negative Presssure Wound Therapy: Other: - Will consider vac after patient has ID appointment 11/09/21 Off-Loading: Turn and reposition every 2 hours Additional Orders / Instructions: Follow Nutritious Diet Home Health: No change in wound care orders this week; continue Home Health for wound care. May utilize formulary equivalent dressing for wound treatment orders unless otherwise specified. Dressing changes to be completed by [facility] on Monday / Wednesday / Friday except when patient has scheduled visit at [facility]. Other Home Health Orders/Instructions: - [place] #1: - Sacrum Wound Laterality: Cleanser: Wound Cleanser ([place]) 1 x Per Day/30 Days Discharge Instructions: Cleanse the wound with wound cleanser prior to applying a clean dressing using gauze sponges, not tissue or cotton balls. Peri-Wound Care: Ketoconazole Cream 2% 1 x Per Day/30 Days Discharge Instructions: Apply Ketoconazole in clinic Peri-Wound Care: Zinc Oxide Ointment 30g tube 1 x Per Day/30 Days Discharge Instructions: Apply Zinc Oxide or desitin to periwound with each dressing change Prim Dressing: Dakin's Solution 0.125%, 16 (oz) (Home Health) 1 x Per Day/30 Days ary Discharge Instructions: Moisten gauze with Dakin's solution and pack lightly into wound Secondary Dressing: Woven Gauze Sponge, Non-Sterile 4x4 in (Home Health) 1 x Per Day/30 Days Discharge Instructions: Apply over primary dressing as directed. Secondary Dressing: Zetuvit Plus Silicone Border Dressing 7x7(in/in) (Home Health) 1 x Per Day/30 Days Discharge Instructions: Apply silicone border over primary dressing as directed. 1. Dakin's wet-to-dry 2. Follow-up in 3 weeks 3. Run  IVR for [employer]) Signed: 10/30/2021 2:33:16 PM By: Doris Shan DO Entered By: [name] on 10/30/2021 14:32:40 -------------------------------------------------------------------------------- HxROS Details Patient Name: Date of Service: [name], [name] 10/29/2021 3:15 PM Medical Record Number: [id number] Patient Account Number: [id number] Date of Birth/Sex: Treating RN: [birth year] (60 y.o. [name], [name] Primary Care Provider: Melissa Lopez Other Clinician: Referring Provider: Treating Provider/Extender: [name] in Treatment: 18 Information Obtained From Patient Hematologic/Lymphatic Medical History: Positive for: Anemia Endocrine Medical History: Positive for: Type I Diabetes - s Time with diabetes: 33 years Treated with: Insulin Blood sugar tested every day: Yes Tested : constant with device Genitourinary Medical History: Positive for: End Stage Renal Disease - off dialysis since transplants Immunological Medical History: Negative for: Lupus Erythematosus; Raynauds; Scleroderma Integumentary (Skin) Medical History: Negative for: History of Burn Musculoskeletal Medical History: Positive for: Osteoarthritis; Osteomyelitis Neurologic Medical History: Positive for: Neuropathy Oncologic Medical History: Positive for: Received Radiation Negative for: Received  Chemotherapy Immunizations Pneumococcal Vaccine: Received Pneumococcal Vaccination: Yes Received Pneumococcal Vaccination On or After 60th Birthday: No Immunization Notes: states being up to date on all - unknown dates for immuization Implantable Devices None Hospitalization / Surgery History Type of Hospitalization/Surgery infected wound (pressure) 2022 (R) BKA amp Oct 2021 Family and Social History Cancer: Yes - Father; Diabetes: Yes - Father; Heart Disease: Yes - Maternal Grandparents; Hereditary Spherocytosis: No;  Hypertension: Yes - Mother; Kidney Disease: No; Lung Disease: No; Seizures: No; Stroke: No; Thyroid Problems: Yes - Mother,Siblings; Tuberculosis: No; Former smoker - from teens to 32's; Marital Status - Married; Alcohol Use: Rarely - beer; Drug Use: No History; Caffeine Use: Daily - coffee; Financial Concerns: No; Food, Clothing or Shelter Needs: No; Support System Lacking: No; Transportation Concerns: No Electronic Signature(s) Signed: 10/30/2021 2:33:16 PM By: Doris Shan DO Signed: 11/03/2021 4:45:49 PM By: Rhae Hammock RN Entered By: [name] on 10/30/2021 14:27:18 -------------------------------------------------------------------------------- SuperBill Details Patient Name: Date of Service: [name], [name] 10/29/2021 Medical Record Number: [id number] Patient Account Number: [id number] Date of Birth/Sex: Treating RN: [birth year] (60 y.o. [name] Primary Care Provider: Melissa Lopez Other Clinician: Referring Provider: Treating Provider/Extender: [name] [name]: 18 Diagnosis Coding ICD-10 Codes Code Description [phone] Pressure ulcer of sacral region, stage 3 E10.9 Type 1 diabetes mellitus without complications E93.810 Acquired absence of right leg below knee Z94.0 Kidney transplant status Facility Procedures CPT4 Code: [id number] Description: 52778 - WOUND CARE VISIT-LEV 3 EST PT Modifier: Quantity: 1 Physician Procedures Electronic Signature(s) Signed: 10/30/2021 2:33:16 PM By: Doris Shan DO Previous Signature: 10/29/2021 6:31:34 PM Version By: [name] Entered By: [name] on 10/30/2021 14:32:59

## 2021-11-05 DIAGNOSIS — R339 Retention of urine, unspecified: Secondary | ICD-10-CM | POA: Diagnosis not present

## 2021-11-05 DIAGNOSIS — N1831 Chronic kidney disease, stage 3a: Secondary | ICD-10-CM | POA: Diagnosis not present

## 2021-11-05 DIAGNOSIS — Z7952 Long term (current) use of systemic steroids: Secondary | ICD-10-CM | POA: Diagnosis not present

## 2021-11-05 DIAGNOSIS — E1022 Type 1 diabetes mellitus with diabetic chronic kidney disease: Secondary | ICD-10-CM | POA: Diagnosis not present

## 2021-11-05 DIAGNOSIS — Z466 Encounter for fitting and adjustment of urinary device: Secondary | ICD-10-CM | POA: Diagnosis not present

## 2021-11-05 DIAGNOSIS — L89154 Pressure ulcer of sacral region, stage 4: Secondary | ICD-10-CM | POA: Diagnosis not present

## 2021-11-05 DIAGNOSIS — Z7982 Long term (current) use of aspirin: Secondary | ICD-10-CM | POA: Diagnosis not present

## 2021-11-05 DIAGNOSIS — Z435 Encounter for attention to cystostomy: Secondary | ICD-10-CM | POA: Diagnosis not present

## 2021-11-05 DIAGNOSIS — I129 Hypertensive chronic kidney disease with stage 1 through stage 4 chronic kidney disease, or unspecified chronic kidney disease: Secondary | ICD-10-CM | POA: Diagnosis not present

## 2021-11-05 DIAGNOSIS — Z79891 Long term (current) use of opiate analgesic: Secondary | ICD-10-CM | POA: Diagnosis not present

## 2021-11-05 DIAGNOSIS — K219 Gastro-esophageal reflux disease without esophagitis: Secondary | ICD-10-CM | POA: Diagnosis not present

## 2021-11-05 DIAGNOSIS — D631 Anemia in chronic kidney disease: Secondary | ICD-10-CM | POA: Diagnosis not present

## 2021-11-05 DIAGNOSIS — E785 Hyperlipidemia, unspecified: Secondary | ICD-10-CM | POA: Diagnosis not present

## 2021-11-05 DIAGNOSIS — C519 Malignant neoplasm of vulva, unspecified: Secondary | ICD-10-CM | POA: Diagnosis not present

## 2021-11-05 DIAGNOSIS — E1069 Type 1 diabetes mellitus with other specified complication: Secondary | ICD-10-CM | POA: Diagnosis not present

## 2021-11-05 DIAGNOSIS — F321 Major depressive disorder, single episode, moderate: Secondary | ICD-10-CM | POA: Diagnosis not present

## 2021-11-09 ENCOUNTER — Ambulatory Visit (INDEPENDENT_AMBULATORY_CARE_PROVIDER_SITE_OTHER): Payer: BC Managed Care – PPO

## 2021-11-09 ENCOUNTER — Ambulatory Visit (INDEPENDENT_AMBULATORY_CARE_PROVIDER_SITE_OTHER): Payer: BC Managed Care – PPO | Admitting: Infectious Disease

## 2021-11-09 ENCOUNTER — Other Ambulatory Visit: Payer: Self-pay

## 2021-11-09 ENCOUNTER — Encounter: Payer: Self-pay | Admitting: Infectious Disease

## 2021-11-09 VITALS — BP 147/82 | HR 88 | Temp 98.0°F

## 2021-11-09 DIAGNOSIS — M86271 Subacute osteomyelitis, right ankle and foot: Secondary | ICD-10-CM

## 2021-11-09 DIAGNOSIS — Z940 Kidney transplant status: Secondary | ICD-10-CM

## 2021-11-09 DIAGNOSIS — M4628 Osteomyelitis of vertebra, sacral and sacrococcygeal region: Secondary | ICD-10-CM | POA: Diagnosis not present

## 2021-11-09 DIAGNOSIS — Z23 Encounter for immunization: Secondary | ICD-10-CM

## 2021-11-09 DIAGNOSIS — E1069 Type 1 diabetes mellitus with other specified complication: Secondary | ICD-10-CM | POA: Diagnosis not present

## 2021-11-09 DIAGNOSIS — Z7185 Encounter for immunization safety counseling: Secondary | ICD-10-CM

## 2021-11-09 DIAGNOSIS — M8668 Other chronic osteomyelitis, other site: Secondary | ICD-10-CM

## 2021-11-09 DIAGNOSIS — E785 Hyperlipidemia, unspecified: Secondary | ICD-10-CM

## 2021-11-09 NOTE — Progress Notes (Signed)
   Covid-19 Vaccination Clinic  Name:  Aleria Maheu    MRN: [id number] DOB: 27-Mar-1961  11/09/2021  Ms. Egler was observed post Covid-19 immunization for 15 minutes without incident. She was provided with Vaccine Information Sheet and instruction to access the V-Safe system.   Ms. Telford was instructed to call 911 with any severe reactions post vaccine: Difficulty breathing  Swelling of face and throat  A fast heartbeat  A bad rash all over body  Dizziness and weakness   Immunizations Administered     Name Date Dose VIS Date Route   Pfizer Covid-19 Vaccine Bivalent Booster 11/09/2021  4:57 PM 0.3 mL 08/26/2021 Intramuscular   Manufacturer: [place]   Lot: [id number]   [place]: [phone]

## 2021-11-09 NOTE — Progress Notes (Signed)
Subjective:  Chief complaint follow-up for sacral decubitus ulcer where she had  Patient ID: [name], female    DOB: October 06, 1961, 60 y.o.   MRN: [id number]  HPI  [name] is a 59 year old Caucasian female with diabetes mellitus prior end-stage renal disease status post renal and pancreatic transplant at the [facility] followed by 2 other renal transplantation's, who had osteomyelitis involving the foot underwent right-sided below the knee amputation.  She became quite debilitated in the context of her severe diabetic foot infection that required BKA.  She has been residing in skilled nursing facility and developed a soft tissue infection of the sacral area which then worsened and eventually lead to exposed bone.    She was seen by my partner Dr. Gale Journey in January.  Neurosurgery and orthopedics also saw her but did not want to perform sacral bone biopsy.  She was given an empiric regimen of ceftriaxone 2 g daily along with 100 mg of doxycycline twice daily through February 17.  She was not seen by [place] in follow-up since then but is been followed closely by wound care.  Her wound had continued to improve but early in August apparently fell smelling drainage and cultures were done from the wound which yielded a group G Streptococcus along with corynebacterium.  She was given a 7-day course of amoxicillin and also given Dakin solution to apply to the wound.  Repeat MRI was formed August 06, 2021 which showed her decubitus ulcer with improving bone marrow edema in the sacrum with small residual edema in the distal sacrum and coccyx.   Referred back to [place] infectious disease for consideration whether she needed further antimicrobial therapy given the radiographic findings of residual osteomyelitis.  She still has pain at the site but is being able to offload it much more than before and is residing at home with her husband who accompanied her to the clinic today.  They also have a medical mattress at  home.  I saw her she appeared to be doing relatively well we opted for oral therapy with amoxicillin 3 times a day.  She was supposed to quit 6 weeks and see me prior to stopping it but she has completed several months of amoxicillin.  She continues to follow with Dr. Heber [place] with wound care and is doing quite well with granulation of the tissue in the wound now deemed a stage III decubitus ulcer.    Past Medical History:  Diagnosis Date   Anemia of chronic renal failure    Chronic osteomyelitis of sacrum ([place]) 08/25/2021   DDD (degenerative disc disease), lumbar    Diabetes mellitus without complication ([place])    type 1   Gait disorder    neurogenic   Hypertension    Hypothyroid 12/08/2020   Neuropathy    Renal disease    Sacral decubitus ulcer, stage IV ([place]) 12/08/2020    Past Surgical History:  Procedure Laterality Date   AMPUTATION Right 10/01/2020   Procedure: RIGHT BELOW KNEE AMPUTATION;  Surgeon: Newt Minion, MD;  Location: [facility];  Service: Orthopedics;  Laterality: Right;   BLADDER SURGERY     ESOPHAGOGASTRODUODENOSCOPY (EGD) WITH PROPOFOL N/A 11/10/2020   Procedure: ESOPHAGOGASTRODUODENOSCOPY (EGD) WITH PROPOFOL;  Surgeon: Irene Shipper, MD;  Location: [facility];  Service: Endoscopy;  Laterality: N/A;   IR GASTROSTOMY TUBE REMOVAL  03/25/2021   KIDNEY TRANSPLANT     LEG SURGERY     tib-fib fx , ORIF    Family History  Problem Relation Age of Onset   Hypertension Mother    Hypertension Father       Social History   Socioeconomic History   Marital status: Married    Spouse name: Not on file   Number of children: Not on file   Years of education: Not on file   Highest education level: Not on file  Occupational History   Not on file  Tobacco Use   Smoking status: Never   Smokeless tobacco: Never  Vaping Use   Vaping Use: Never used  Substance and Sexual Activity   Alcohol use: Never   Drug use: Never   Sexual activity: Not Currently    Birth  control/protection: None  Other Topics Concern   Not on file  Social History Narrative   Not on file   Social Determinants of Health   Financial Resource Strain: Not on file  Food Insecurity: Not on file  Transportation Needs: Not on file  Physical Activity: Not on file  Stress: Not on file  Social Connections: Not on file    No Known Allergies   Current Outpatient Medications:    acetaminophen (TYLENOL) 325 MG tablet, Take 2 tablets (650 mg total) by mouth every 6 (six) hours as needed for mild pain (or Fever >/= 101). (Patient taking differently: Take 650 mg by mouth every 6 (six) hours as needed for mild pain or fever.), Disp: , Rfl:    amoxicillin (AMOXIL) 500 MG capsule, Take by mouth., Disp: , Rfl:    aspirin EC 81 MG tablet, Take 81 mg by mouth daily. Swallow whole., Disp: , Rfl:    atorvastatin (LIPITOR) 10 MG tablet, Take 1 tablet (10 mg total) by mouth daily., Disp: 30 tablet, Rfl: 2   azaTHIOprine (IMURAN) 50 MG tablet, Take 1 tablet (50 mg total) by mouth daily., Disp: 30 tablet, Rfl: 2   B Complex-C-Folic Acid TABS, Take 1 tablet by mouth daily., Disp: , Rfl:    carbamide peroxide (DEBROX) 6.5 % OTIC solution, Place 5 drops into the right ear 2 (two) times daily., Disp: 15 mL, Rfl: 0   cholestyramine (QUESTRAN) 4 g packet, Take 1 packet (4 g total) by mouth 2 (two) times daily., Disp: 60 each, Rfl: 12   collagenase (SANTYL) ointment, Apply topically daily., Disp: 15 g, Rfl: 2   cycloSPORINE (SANDIMMUNE) 25 MG capsule, Take 3 capsules (75 mg total) by mouth 2 (two) times daily., Disp: 90 capsule, Rfl: 2   insulin glargine (LANTUS) 100 UNIT/ML injection, Inject 0.05 mLs (5 Units total) into the skin at bedtime., Disp: 10 mL, Rfl: 11   insulin lispro (HUMALOG) 100 UNIT/ML injection, Inject 0.02 mLs (2 Units total) into the skin 3 (three) times daily before meals. Sliding scale if 200-250=2 units;251-300=4units;301-350=6 units;351-400=8 units;104-450=10 units;451-500=12 units  subcutaneously before meals and at bedtime for DM, Disp: 10 mL, Rfl: 11   levothyroxine (SYNTHROID) 88 MCG tablet, Take 1 tablet (88 mcg total) by mouth daily before breakfast., Disp: 30 tablet, Rfl: 2   liver oil-zinc oxide (DESITIN) 40 % ointment, Apply topically as needed for irritation., Disp: 56.7 g, Rfl: 0   loperamide (IMODIUM) 2 MG capsule, Take 1 capsule (2 mg total) by mouth as needed for diarrhea or loose stools., Disp: 30 capsule, Rfl: 0   ondansetron (ZOFRAN-ODT) 4 MG disintegrating tablet, Take 1 tablet (4 mg total) by mouth every 4 (four) hours as needed for nausea or vomiting., Disp: 20 tablet, Rfl: 2   oxyCODONE (OXY IR/ROXICODONE) 5 MG  immediate release tablet, Take 1 tablet (5 mg total) by mouth every 6 (six) hours as needed for severe pain., Disp: 30 tablet, Rfl: 0   pantoprazole (PROTONIX) 40 MG tablet, Take 1 tablet (40 mg total) by mouth daily., Disp: , Rfl:    predniSONE (DELTASONE) 5 MG tablet, Take 1 tablet (5 mg total) by mouth daily with breakfast., Disp: 30 tablet, Rfl: 2   saccharomyces boulardii (FLORASTOR) 250 MG capsule, Take 1 capsule (250 mg total) by mouth 2 (two) times daily., Disp: 60 capsule, Rfl: 2   sertraline (ZOLOFT) 25 MG tablet, Take 1 tablet (25 mg total) by mouth daily., Disp: 30 tablet, Rfl: 2   triamcinolone (NASACORT) 55 MCG/ACT AERO nasal inhaler, USE 1 SPRAY IN EACH NOSTRIL, Disp: 16.9 each, Rfl: 6   ciprofloxacin (CIPRO) 500 MG tablet, Take 1 tablet (500 mg total) by mouth 2 (two) times daily. (Patient not taking: Reported on 11/09/2021), Disp: 10 tablet, Rfl: 0   Review of Systems     Objective:   Physical Exam Constitutional:      General: She is not in acute distress.    Appearance: Normal appearance. She is well-developed. She is not ill-appearing or diaphoretic.  HENT:     Head: Normocephalic and atraumatic.     Right Ear: Hearing and external ear normal.     Left Ear: Hearing and external ear normal.     Nose: No nasal deformity or  rhinorrhea.  Eyes:     General: No scleral icterus.    Conjunctiva/sclera: Conjunctivae normal.     Right eye: Right conjunctiva is not injected.     Left eye: Left conjunctiva is not injected.     Pupils: Pupils are equal, round, and reactive to light.  Neck:     Vascular: No JVD.  Cardiovascular:     Rate and Rhythm: Normal rate and regular rhythm.     Heart sounds: Normal heart sounds, S1 normal and S2 normal. No murmur heard.   No friction rub.  Abdominal:     General: Bowel sounds are normal. There is no distension.     Palpations: Abdomen is soft.     Tenderness: There is no abdominal tenderness.  Musculoskeletal:        General: Normal range of motion.     Right shoulder: Normal.     Left shoulder: Normal.     Cervical back: Normal range of motion and neck supple.     Right hip: Normal.     Left hip: Normal.     Right knee: Normal.     Left knee: Normal.  Lymphadenopathy:     Head:     Right side of head: No submandibular, preauricular or posterior auricular adenopathy.     Left side of head: No submandibular, preauricular or posterior auricular adenopathy.     Cervical: No cervical adenopathy.     Right cervical: No superficial or deep cervical adenopathy.    Left cervical: No superficial or deep cervical adenopathy.  Skin:    General: Skin is warm and dry.     Coloration: Skin is not pale.     Findings: No abrasion, bruising, ecchymosis, erythema, lesion or rash.     Nails: There is no clubbing.  Neurological:     Mental Status: She is alert and oriented to person, place, and time.     Sensory: No sensory deficit.     Coordination: Coordination normal.     Gait: Gait normal.  Psychiatric:  Attention and Perception: She is attentive.        Speech: Speech normal.        Behavior: Behavior normal. Behavior is cooperative.        Thought Content: Thought content normal.        Judgment: Judgment normal.     Sacral decubitus ulcer  08/25/2021:     Right BKA site 08/25/2021:      Left foot 08/25/2021:          Assessment & Plan:  Sacral decubitus ulcer with osteomyelitis:  I have been encouraged by how she is progressing we will check inflammatory markers today along with CBC and metabolic panel.  I am stopping her amoxicillin  Right below the knee amputation site is well-healed  History of renal transplantation x3 renal function is been doing relatively normally.    Certainly being on immunosuppressive drugs does make it more difficult for her to heal wounds and make her more prone to infection along with her diabetes mellitus.   Vaccine counseling recommend that she get the updated COVID-19 booster today and she received this.

## 2021-11-10 DIAGNOSIS — E1022 Type 1 diabetes mellitus with diabetic chronic kidney disease: Secondary | ICD-10-CM | POA: Diagnosis not present

## 2021-11-10 DIAGNOSIS — N1831 Chronic kidney disease, stage 3a: Secondary | ICD-10-CM | POA: Diagnosis not present

## 2021-11-10 LAB — BASIC METABOLIC PANEL WITH GFR
BUN/Creatinine Ratio: 36 (calc) — ABNORMAL HIGH (ref 6–22)
BUN: 39 mg/dL — ABNORMAL HIGH (ref 7–25)
CO2: 24 mmol/L (ref 20–32)
Calcium: 9.2 mg/dL (ref 8.6–10.4)
Chloride: 107 mmol/L (ref 98–110)
Creat: 1.09 mg/dL — ABNORMAL HIGH (ref 0.50–1.05)
Glucose, Bld: 179 mg/dL — ABNORMAL HIGH (ref 65–99)
Potassium: 4.8 mmol/L (ref 3.5–5.3)
Sodium: 139 mmol/L (ref 135–146)
eGFR: 58 mL/min/{1.73_m2} — ABNORMAL LOW (ref >=60)

## 2021-11-10 LAB — CBC WITH DIFFERENTIAL/PLATELET
Absolute Monocytes: 296 cells/uL (ref 200–950)
Basophils Absolute: 31 cells/uL (ref 0–200)
Basophils Relative: 0.6 %
Eosinophils Absolute: 71 cells/uL (ref 15–500)
Eosinophils Relative: 1.4 %
HCT: 30.2 % — ABNORMAL LOW (ref 35.0–45.0)
Hemoglobin: 10 g/dL — ABNORMAL LOW (ref 11.7–15.5)
Lymphs Abs: 1290 cells/uL (ref 850–3900)
MCH: 36.6 pg — ABNORMAL HIGH (ref 27.0–33.0)
MCHC: 33.1 g/dL (ref 32.0–36.0)
MCV: 110.6 fL — ABNORMAL HIGH (ref 80.0–100.0)
MPV: 10.9 fL (ref 7.5–12.5)
Monocytes Relative: 5.8 %
Neutro Abs: 3412 cells/uL (ref 1500–7800)
Neutrophils Relative %: 66.9 %
Platelets: 237 10*3/uL (ref 140–400)
RBC: 2.73 10*6/uL — ABNORMAL LOW (ref 3.80–5.10)
RDW: 14.5 % (ref 11.0–15.0)
Total Lymphocyte: 25.3 %
WBC: 5.1 10*3/uL (ref 3.8–10.8)

## 2021-11-10 LAB — C-REACTIVE PROTEIN: CRP: 7.5 mg/L (ref <8.0)

## 2021-11-10 LAB — SEDIMENTATION RATE: Sed Rate: 99 mm/h — ABNORMAL HIGH (ref 0–30)

## 2021-11-20 DIAGNOSIS — D84821 Immunodeficiency due to drugs: Secondary | ICD-10-CM | POA: Diagnosis not present

## 2021-11-20 DIAGNOSIS — Z89511 Acquired absence of right leg below knee: Secondary | ICD-10-CM | POA: Diagnosis not present

## 2021-11-20 DIAGNOSIS — L89154 Pressure ulcer of sacral region, stage 4: Secondary | ICD-10-CM | POA: Diagnosis not present

## 2021-11-20 DIAGNOSIS — Z79899 Other long term (current) drug therapy: Secondary | ICD-10-CM | POA: Diagnosis not present

## 2021-11-23 DIAGNOSIS — I10 Essential (primary) hypertension: Secondary | ICD-10-CM | POA: Diagnosis not present

## 2021-11-23 DIAGNOSIS — E1069 Type 1 diabetes mellitus with other specified complication: Secondary | ICD-10-CM | POA: Diagnosis not present

## 2021-11-23 DIAGNOSIS — E039 Hypothyroidism, unspecified: Secondary | ICD-10-CM | POA: Diagnosis not present

## 2021-11-23 DIAGNOSIS — E10319 Type 1 diabetes mellitus with unspecified diabetic retinopathy without macular edema: Secondary | ICD-10-CM | POA: Diagnosis not present

## 2021-11-23 DIAGNOSIS — E1040 Type 1 diabetes mellitus with diabetic neuropathy, unspecified: Secondary | ICD-10-CM | POA: Diagnosis not present

## 2021-11-24 DIAGNOSIS — E785 Hyperlipidemia, unspecified: Secondary | ICD-10-CM | POA: Diagnosis not present

## 2021-11-24 DIAGNOSIS — Z79890 Hormone replacement therapy (postmenopausal): Secondary | ICD-10-CM | POA: Diagnosis not present

## 2021-11-24 DIAGNOSIS — E1022 Type 1 diabetes mellitus with diabetic chronic kidney disease: Secondary | ICD-10-CM | POA: Diagnosis not present

## 2021-11-24 DIAGNOSIS — Z79891 Long term (current) use of opiate analgesic: Secondary | ICD-10-CM | POA: Diagnosis not present

## 2021-11-24 DIAGNOSIS — Z466 Encounter for fitting and adjustment of urinary device: Secondary | ICD-10-CM | POA: Diagnosis not present

## 2021-11-24 DIAGNOSIS — Z9483 Pancreas transplant status: Secondary | ICD-10-CM | POA: Diagnosis not present

## 2021-11-24 DIAGNOSIS — I10 Essential (primary) hypertension: Secondary | ICD-10-CM | POA: Diagnosis not present

## 2021-11-24 DIAGNOSIS — R339 Retention of urine, unspecified: Secondary | ICD-10-CM | POA: Diagnosis not present

## 2021-11-24 DIAGNOSIS — Z7982 Long term (current) use of aspirin: Secondary | ICD-10-CM | POA: Diagnosis not present

## 2021-11-24 DIAGNOSIS — D631 Anemia in chronic kidney disease: Secondary | ICD-10-CM | POA: Diagnosis not present

## 2021-11-24 DIAGNOSIS — Z940 Kidney transplant status: Secondary | ICD-10-CM | POA: Diagnosis not present

## 2021-11-24 DIAGNOSIS — F321 Major depressive disorder, single episode, moderate: Secondary | ICD-10-CM | POA: Diagnosis not present

## 2021-11-24 DIAGNOSIS — L89154 Pressure ulcer of sacral region, stage 4: Secondary | ICD-10-CM | POA: Diagnosis not present

## 2021-11-24 DIAGNOSIS — N1831 Chronic kidney disease, stage 3a: Secondary | ICD-10-CM | POA: Diagnosis not present

## 2021-11-24 DIAGNOSIS — Z7952 Long term (current) use of systemic steroids: Secondary | ICD-10-CM | POA: Diagnosis not present

## 2021-11-24 DIAGNOSIS — K219 Gastro-esophageal reflux disease without esophagitis: Secondary | ICD-10-CM | POA: Diagnosis not present

## 2021-11-26 ENCOUNTER — Encounter (HOSPITAL_BASED_OUTPATIENT_CLINIC_OR_DEPARTMENT_OTHER): Payer: BC Managed Care – PPO | Attending: Internal Medicine | Admitting: Internal Medicine

## 2021-11-26 ENCOUNTER — Other Ambulatory Visit: Payer: Self-pay

## 2021-11-26 DIAGNOSIS — Z89511 Acquired absence of right leg below knee: Secondary | ICD-10-CM | POA: Insufficient documentation

## 2021-11-26 DIAGNOSIS — E109 Type 1 diabetes mellitus without complications: Secondary | ICD-10-CM | POA: Diagnosis not present

## 2021-11-26 DIAGNOSIS — E10622 Type 1 diabetes mellitus with other skin ulcer: Secondary | ICD-10-CM

## 2021-11-26 DIAGNOSIS — L89153 Pressure ulcer of sacral region, stage 3: Secondary | ICD-10-CM | POA: Insufficient documentation

## 2021-11-26 DIAGNOSIS — Z940 Kidney transplant status: Secondary | ICD-10-CM | POA: Diagnosis not present

## 2021-11-27 NOTE — Progress Notes (Signed)
[name], [name] ([id number]) Visit Report for 11/26/2021 Arrival Information Details Patient Name: Date of Service: [name], [name] 11/26/2021 3:15 PM Medical Record Number: [id number] Patient Account Number: [id number] Date of Birth/Sex: Treating RN: [birth year] (60 y.o. [name] Primary Care [name]: [name] Other Clinician: Referring [name]: Treating [name]: [name] in Treatment: 3 Visit Information History Since Last Visit All ordered tests and consults were completed: Yes Patient Arrived: Wheel Chair Added or deleted any medications: No Arrival Time: 15:43 Any new allergies or adverse reactions: No Accompanied By: husband Had a fall or experienced change in No Transfer Assistance: None activities of daily living that may affect Patient Identification Verified: Yes risk of falls: Secondary Verification Process Completed: Yes Signs or symptoms of abuse/neglect since last visito No Patient Requires Transmission-Based Precautions: No Hospitalized since last visit: No Patient Has Alerts: No Implantable device outside of the clinic excluding No cellular tissue based products placed in the center since last visit: Has Dressing in Place as Prescribed: Yes Pain Present Now: Yes Electronic Signature(s) Signed: 11/26/2021 5:30:54 PM By: [name] Entered By: [name] on 11/26/2021 15:51:18 -------------------------------------------------------------------------------- Clinic Level of Care Assessment Details Patient Name: Date of Service: [name] 11/26/2021 3:15 PM Medical Record Number: [id number] Patient Account Number: [id number] Date of Birth/Sex: Treating RN: May 23, 1961 (60 y.o. [name] Primary Care [name]: [name] Other Clinician: Referring [name]: Treating [name]/Extender: [name] in Treatment: 22 Clinic Level of Care Assessment  Items TOOL 4 Quantity Score X- 1 0 Use when only an EandM is performed on FOLLOW-UP visit ASSESSMENTS - Nursing Assessment / Reassessment X- 1 10 Reassessment of Co-morbidities (includes updates in patient status) X- 1 5 Reassessment of Adherence to Treatment Plan ASSESSMENTS - Wound and Skin A ssessment / Reassessment X - Simple Wound Assessment / Reassessment - one wound 1 5 []  - 0 Complex Wound Assessment / Reassessment - multiple wounds []  - 0 Dermatologic / Skin Assessment (not related to wound area) ASSESSMENTS - Focused Assessment []  - 0 Circumferential Edema Measurements - multi extremities []  - 0 Nutritional Assessment / Counseling / Intervention []  - 0 Lower Extremity Assessment (monofilament, tuning fork, pulses) []  - 0 Peripheral Arterial Disease Assessment (using hand held doppler) ASSESSMENTS - Ostomy and/or Continence Assessment and Care []  - 0 Incontinence Assessment and Management []  - 0 Ostomy Care Assessment and Management (repouching, etc.) PROCESS - Coordination of Care []  - 0 Simple Patient / Family Education for ongoing care X- 1 20 Complex (extensive) Patient / Family Education for ongoing care X- 1 10 Staff obtains Programmer, systems, Records, T Results / Process Orders est []  - 0 Staff telephones HHA, Nursing Homes / Clarify orders / etc []  - 0 Routine Transfer to another Facility (non-emergent condition) []  - 0 Routine Hospital Admission (non-emergent condition) []  - 0 New Admissions / Biomedical engineer / Ordering NPWT Apligraf, etc. , []  - 0 Emergency Hospital Admission (emergent condition) []  - 0 Simple Discharge Coordination []  - 0 Complex (extensive) Discharge Coordination PROCESS - Special Needs []  - 0 Pediatric / Minor Patient Management []  - 0 Isolation Patient Management []  - 0 Hearing / Language / Visual special needs []  - 0 Assessment of Community assistance (transportation, D/C planning, etc.) []  - 0 Additional  assistance / Altered mentation []  - 0 Support Surface(s) Assessment (bed, cushion, seat, etc.) INTERVENTIONS - Wound Cleansing / Measurement X - Simple Wound Cleansing - one wound 1 5 []  - 0 Complex Wound Cleansing -  multiple wounds X- 1 5 Wound Imaging (photographs - any number of wounds) []  - 0 Wound Tracing (instead of photographs) X- 1 5 Simple Wound Measurement - one wound []  - 0 Complex Wound Measurement - multiple wounds INTERVENTIONS - Wound Dressings []  - 0 Small Wound Dressing one or multiple wounds X- 1 15 Medium Wound Dressing one or multiple wounds []  - 0 Large Wound Dressing one or multiple wounds []  - 0 Application of Medications - topical []  - 0 Application of Medications - injection INTERVENTIONS - Miscellaneous []  - 0 External ear exam []  - 0 Specimen Collection (cultures, biopsies, blood, body fluids, etc.) []  - 0 Specimen(s) / Culture(s) sent or taken to Lab for analysis []  - 0 Patient Transfer (multiple staff / Civil Service fast streamer / Similar devices) []  - 0 Simple Staple / Suture removal (25 or less) []  - 0 Complex Staple / Suture removal (26 or more) []  - 0 Hypo / Hyperglycemic Management (close monitor of Blood Glucose) []  - 0 Ankle / Brachial Index (ABI) - do not check if billed separately X- 1 5 Vital Signs Has the patient been seen at the hospital within the last three years: Yes Total Score: 85 Level Of Care: New/Established - Level 3 Electronic Signature(s) Signed: 11/26/2021 5:30:54 PM By: [name] Entered By: [name] on 11/26/2021 16:19:11 -------------------------------------------------------------------------------- Encounter Discharge Information Details Patient Name: Date of Service: [name], [name] 11/26/2021 3:15 PM Medical Record Number: [id number] Patient Account Number: [id number] Date of Birth/Sex: Treating RN: 01-25-1961 (60 y.o. [name] Primary Care [name]: [name] Other Clinician: Referring  [name]: Treating [name]: [name] in Treatment: 22 Encounter Discharge Information Items Discharge Condition: Stable Ambulatory Status: Wheelchair Discharge Destination: Home Transportation: Private Auto Accompanied By: husband Schedule Follow-up Appointment: Yes Clinical Summary of Care: Provided on 11/26/2021 Form Type Recipient Paper Patient Patient Electronic Signature(s) Signed: 11/26/2021 5:30:54 PM By: [name] Entered By: [name] on 11/26/2021 16:26:30 -------------------------------------------------------------------------------- Lower Extremity Assessment Details Patient Name: Date of Service: [name], [name] 11/26/2021 3:15 PM Medical Record Number: [id number] Patient Account Number: [id number] Date of Birth/Sex: Treating RN: May 03, 1961 (60 y.o. [name] Primary Care Donnald Tabar: [name] Other Clinician: Referring Cyrena Kuchenbecker: Treating Lillianna Sabel/Extender: [name]: 22 Electronic Signature(s) Signed: 11/26/2021 5:30:54 PM By: [name] Entered By: [name] on 11/26/2021 15:43:56 -------------------------------------------------------------------------------- Multi Wound Chart Details Patient Name: Date of Service: [name], [name] 11/26/2021 3:15 PM Medical Record Number: [id number] Patient Account Number: [id number] Date of Birth/Sex: Treating RN: [birth year] (60 y.o. F) Primary Care Bricen Victory: [name] Other Clinician: Referring Mykenna Viele: Treating Ocia Simek/Extender: [name] in Treatment: 22 Vital Signs Height(in): 53 Capillary Blood Glucose(mg/dl): 97 Weight(lbs): 110 Pulse(bpm): 90 Body Mass Index(BMI): 28 Blood Pressure(mmHg): 123/78 Temperature(F): 98.1 Respiratory Rate(breaths/min): 16 Photos: [N/A:N/A] Sacrum N/A N/A Wound Location: Pressure Injury N/A N/A Wounding Event: Pressure  Ulcer N/A N/A Primary Etiology: Anemia, Type I Diabetes, End Stage N/A N/A Comorbid History: Renal Disease, Osteoarthritis, Osteomyelitis, Neuropathy, Received Radiation 02/24/2021 N/A N/A Date Acquired: 22 N/A N/A Weeks of Treatment: Open N/A N/A Wound Status: 4.3x3.5x0.4 N/A N/A Measurements L x W x D (cm) 11.82 N/A N/A A (cm) : rea 4.728 N/A N/A Volume (cm) : 52.10% N/A N/A % Reduction in A rea: 87.20% N/A N/A % Reduction in Volume: 1 Starting Position 1 (o'clock): 5 Ending Position 1 (o'clock): 1.5 Maximum Distance 1 (cm): Yes N/A N/A Undermining: Category/Stage III N/A N/A Classification: Medium N/A N/A  Exudate A mount: Purulent N/A N/A Exudate Type: yellow, brown, green N/A N/A Exudate Color: Epibole N/A N/A Wound Margin: Large (67-100%) N/A N/A Granulation A mount: Red, Pink N/A N/A Granulation Quality: Small (1-33%) N/A N/A Necrotic A mount: Fat Layer (Subcutaneous Tissue): Yes N/A N/A Exposed Structures: Fascia: No Tendon: No Muscle: No Joint: No Bone: No Medium (34-66%) N/A N/A Epithelialization: Treatment Notes Electronic Signature(s) Signed: 11/27/2021 9:35:33 AM By: [name] DO Entered By: [name] on 11/26/2021 16:24:20 -------------------------------------------------------------------------------- Multi-Disciplinary Care Plan Details Patient Name: Date of Service: [name], [name] 11/26/2021 3:15 PM Medical Record Number: [id number] Patient Account Number: [id number] Date of Birth/Sex: Treating RN: [birth year] (60 y.o. [name] Primary Care Drelyn Pistilli: [name] Other Clinician: Referring Denetria Luevanos: Treating Reika Callanan/Extender: [name]: 22 Active Inactive Wound/Skin Impairment Nursing Diagnoses: Impaired tissue integrity Knowledge deficit related to ulceration/compromised skin integrity Goals: Patient/caregiver will verbalize understanding of skin  care regimen Date Initiated: 06/19/2021 Target Resolution Date: 12/31/2021 Goal Status: Active Ulcer/skin breakdown will have a volume reduction of 30% by week 4 Date Initiated: 06/19/2021 Date Inactivated: 07/27/2021 Target Resolution Date: 07/02/2021 Goal Status: Unmet Unmet Reason: pressure relief Interventions: Assess patient/caregiver ability to obtain necessary supplies Assess patient/caregiver ability to perform ulcer/skin care regimen upon admission and as needed Assess ulceration(s) every visit Provide education on ulcer and skin care Notes: Electronic Signature(s) Signed: 11/26/2021 5:30:54 PM By: [name] Entered By: [name] on 11/26/2021 15:42:46 -------------------------------------------------------------------------------- Pain Assessment Details Patient Name: Date of Service: [name], [name] 11/26/2021 3:15 PM Medical Record Number: [id number] Patient Account Number: [id number] Date of Birth/Sex: Treating RN: 1961-02-16 (60 y.o. [name] Primary Care Emari Hreha: [name] Other Clinician: Referring Ajaya Crutchfield: Treating Ramadan Couey/Extender: [name] in Treatment: 22 Active Problems Location of Pain Severity and Description of Pain Patient Has Paino Yes Site Locations Pain Location: Pain Location: Pain in Ulcers With Dressing Change: Yes Duration of the Pain. Constant / Intermittento Intermittent Rate the pain. Current Pain Level: 5 Character of Pain Describe the Pain: Tender, Throbbing Pain Management and Medication Current Pain Management: Medication: Yes Cold Application: No Rest: Yes Massage: No Activity: No T.E.N.S.: No Heat Application: No Leg drop or elevation: No Is the Current Pain Management Adequate: Adequate How does your wound impact your activities of daily livingo Sleep: No Bathing: No Appetite: No Relationship With Others: No Bladder Continence: No Emotions: No Bowel  Continence: No Work: No Toileting: No Drive: No Dressing: No Hobbies: No Electronic Signature(s) Signed: 11/26/2021 5:30:54 PM By: [name] Entered By: [name] on 11/26/2021 15:51:06 -------------------------------------------------------------------------------- Patient/Caregiver Education Details Patient Name: Date of Service: [name] 12/1/2022andnbsp3:15 PM Medical Record Number: [id number] Patient Account Number: [id number] Date of Birth/Gender: Treating RN: Aug 18, 1961 (60 y.o. [name] Primary Care Physician: [name] Other Clinician: Referring Physician: Treating Physician/Extender: [name] in Treatment: 22 Education Assessment Education Provided To: Patient Education Topics Provided Pressure: Methods: Explain/Verbal, Printed Responses: State content correctly Wound/Skin Impairment: Methods: Explain/Verbal, Printed Responses: State content correctly Electronic Signature(s) Signed: 11/26/2021 5:30:54 PM By: [name] Entered By: [name] on 11/26/2021 15:43:10 -------------------------------------------------------------------------------- Wound Assessment Details Patient Name: Date of Service: [name], [name] 11/26/2021 3:15 PM Medical Record Number: [id number] Patient Account Number: [id number] Date of Birth/Sex: Treating RN: 1961/05/27 (60 y.o. [name] Primary Care Araya Roel: [name] Other Clinician: Referring Brennden Masten: Treating Amaro Mangold/Extender: [name]: 22 Wound Status Wound Number: 1 Primary Pressure Ulcer Etiology:  Wound Location: Sacrum Wound Open Wounding Event: Pressure Injury Status: Date Acquired: 02/24/2021 Comorbid Anemia, Type I Diabetes, End Stage Renal Disease, Osteoarthritis, Weeks Of Treatment: 22 History: Osteomyelitis, Neuropathy, Received Radiation Clustered Wound: No Photos Wound  Measurements Length: (cm) 4.3 Width: (cm) 3.5 Depth: (cm) 0.4 Area: (cm) 11.82 Volume: (cm) 4.728 % Reduction in Area: 52.1% % Reduction in Volume: 87.2% Epithelialization: Medium (34-66%) Tunneling: No Undermining: Yes Starting Position (o'clock): 1 Ending Position (o'clock): 5 Maximum Distance: (cm) 1.5 Wound Description Classification: Category/Stage III Wound Margin: Epibole Exudate Amount: Medium Exudate Type: Purulent Exudate Color: yellow, brown, green Foul Odor After Cleansing: No Slough/Fibrino Yes Wound Bed Granulation Amount: Large (67-100%) Exposed Structure Granulation Quality: Red, Pink Fascia Exposed: No Necrotic Amount: Small (1-33%) Fat Layer (Subcutaneous Tissue) Exposed: Yes Necrotic Quality: Adherent Slough Tendon Exposed: No Muscle Exposed: No Joint Exposed: No Bone Exposed: No Treatment Notes Wound #1 (Sacrum) Cleanser Wound Cleanser Discharge Instruction: Cleanse the wound with wound cleanser prior to applying a clean dressing using gauze sponges, not tissue or cotton balls. Peri-Wound Care Ketoconazole Cream 2% Discharge Instruction: Apply Ketoconazole to periwound Topical Primary Dressing Dakin's Solution 0.125%, 16 (oz) Discharge Instruction: Moisten gauze with Dakin's solution and pack lightly into wound Secondary Dressing Woven Gauze Sponge, Non-Sterile 4x4 in Discharge Instruction: Apply over primary dressing as directed. Zetuvit Plus Silicone Border Dressing 7x7(in/in) Discharge Instruction: Apply silicone border over primary dressing as directed. Secured With Compression Wrap Compression Stockings Environmental education officer) Signed: 11/26/2021 5:30:54 PM By: [name] Entered By: [name] on 11/26/2021 16:01:54 -------------------------------------------------------------------------------- Vitals Details Patient Name: Date of Service: [name], [name] 11/26/2021 3:15 PM Medical Record Number:  [id number] Patient Account Number: [id number] Date of Birth/Sex: Treating RN: 1961/06/22 (60 y.o. [name] Primary Care Eris Hannan: [name] Other Clinician: Referring Chaslyn Eisen: Treating Wei Newbrough/Extender: [name]: 22 Vital Signs Time Taken: 15:46 Temperature (F): 98.1 Height (in): 53 Pulse (bpm): 90 Weight (lbs): 110 Respiratory Rate (breaths/min): 16 Body Mass Index (BMI): 27.5 Blood Pressure (mmHg): 123/78 Capillary Blood Glucose (mg/dl): 97 Reference Range: 80 - 120 mg / dl Electronic Signature(s) Signed: 11/26/2021 5:30:54 PM By: [name] Entered By: [name] on 11/26/2021 15:47:02

## 2021-11-27 NOTE — Progress Notes (Signed)
[name], [name] ([id number]) Visit Report for 11/26/2021 Chief Complaint Document Details Patient Name: Date of Service: [name], [name] 11/26/2021 3:15 PM Medical Record Number: [id number] Patient Account Number: [id number] Date of Birth/Sex: Treating RN: [birth year] (60 y.o. F) Primary Care Provider: Melissa Lopez Other Clinician: Referring Provider: Treating Provider/Extender: [name] in Treatment: 22 Information Obtained from: Patient Chief Complaint Sacral ulcer Electronic Signature(s) Signed: 11/27/2021 9:35:33 AM By: Doris Shan DO Entered By: [name] on 11/26/2021 16:24:31 -------------------------------------------------------------------------------- HPI Details Patient Name: Date of Service: [name], [name] 11/26/2021 3:15 PM Medical Record Number: [id number] Patient Account Number: [id number] Date of Birth/Sex: Treating RN: [birth year] (60 y.o. F) Primary Care Provider: Melissa Lopez Other Clinician: Referring Provider: Treating Provider/Extender: [name] in Treatment: 22 History of Present Illness HPI Description: Admission 6/24 [name] is a 60 year old female with a past medical history of type 1 diabetes, right BKA, and kidney transplant that presents to the clinic for an 80-month history of sacral ulcer. She has been using wet-to-dry dressings up until 5 weeks ago when she was started on a wound VAC. 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 She has completed 6 weeks of treatment and states she has been discharged by infectious disease. She currently denies any issues to the wound area. She reports minimal pain. She denies signs of infection. 7/11; patient presents for 2-week follow-up. She uses a wound VAC that is changed with home health 3 times a week. She denies any signs of infection. She has no complaints or issues  today. 8/1; patient presents for follow-up. She continues to use a wound VAC and this is changed with home health. She reports pain to the sacrum that started 1 week ago. She denies systemic signs of infection. 8/15; patient presents for 2-week follow-up. She has been using wet-to-dry dressings daily. She reports improvement in the wound size and appearance. She denies infectious symptoms. 9/12; patient presents for follow-up. She has been using wet-to-dry Dakin's moistened gauze. She reports following up with infectious disease and is currently taking amoxicillin. 10/6; patient presents for follow-up. She has been using Dakin's wet-to-dry moistened gauze dressings. She is still taking amoxicillin. She canceled her appointment with infectious disease yesterday because she is tired of going to doctors appointments. She has not picked up gentamicin cream from the pharmacy to use on the wound bed. She currently denies signs of infection. 11/3; patient presents for follow-up. She has been using Dakin's wet-to-dry dressings. She has no issues or complaints today. She denies signs of infection. 12/1; patient presents for follow-up. She continues to use Dakin's wet-to-dry dressings. Patient followed up with [name], ID and amoxicillin was stopped as she had completed her course for sacral osteomyelitis. She reports itching to the periwound. She denies signs of infection. Electronic Signature(s) Signed: 11/27/2021 9:35:33 AM By: Doris Shan DO Entered By: [name] on 11/26/2021 16:26:40 -------------------------------------------------------------------------------- Physical Exam Details Patient Name: Date of Service: [name], [name] 11/26/2021 3:15 PM Medical Record Number: [id number] Patient Account Number: [id number] Date of Birth/Sex: Treating RN: 1960/12/31 (60 y.o. F) Primary Care Provider: Melissa Lopez Other Clinician: Referring Provider: Treating Provider/Extender:  [name] [name]: 22 Constitutional respirations regular, non-labored and within target range for patient.Marland Kitchen Psychiatric pleasant and cooperative. Notes Sacral region: Large open wound with granulation tissue present. Undermining circumferentially. Irritation to the periwound. No obvious signs of soft tissue infection. Electronic Signature(s) Signed: 11/27/2021  9:35:33 AM By: Doris Shan DO Entered By: [name] on 11/26/2021 16:27:31 -------------------------------------------------------------------------------- Physician Orders Details Patient Name: Date of Service: Doris, Lopez 11/26/2021 3:15 PM Medical Record Number: [id number] Patient Account Number: [id number] Date of Birth/Sex: Treating RN: [birth year] (60 y.o. [name] Primary Care Provider: Melissa Lopez Other Clinician: Referring Provider: Treating Provider/Extender: [name] in Treatment: 24 Verbal / Phone Orders: No Diagnosis Coding ICD-10 Coding Code Description E10.622 Type 1 diabetes mellitus with other skin ulcer L89.153 Pressure ulcer of sacral region, stage 3 Z89.511 Acquired absence of right leg below knee Z94.0 Kidney transplant status Follow-up Appointments Return appointment in 3 weeks. - Dr. Heber [place] Cellular or Tissue Based Products Cellular or Tissue Based Product Type: - Run IVR for Theraskin=Not Covered Bathing/ Shower/ Hygiene Do not shower or bathe in tub. Negative Presssure Wound Therapy Wound Vac to wound continuously at 176mm/hg pressure - Will order 11/26/21, HH to apply when obtained. Change dressing 2x week. Black Foam - May apply duoderm to periwound to help ensure seal Off-Loading Turn and reposition every 2 hours Additional Orders / Instructions Follow [facility] wound care orders this week; continue Home Health for wound care. May utilize formulary equivalent dressing for  wound treatment orders unless otherwise specified. - Will order wound vac, when obtained HH to change 2x week. Other Home Health Orders/Instructions: - [place] Wound Treatment Wound #1 - Sacrum Cleanser: Wound Cleanser ([place]) 1 x Per Day/30 Days Discharge Instructions: Cleanse the wound with wound cleanser prior to applying a clean dressing using gauze sponges, not tissue or cotton balls. Peri-Wound Care: Ketoconazole Cream 2% (Home Health) 1 x Per Day/30 Days Discharge Instructions: Apply Ketoconazole to periwound Prim Dressing: Dakin's Solution 0.125%, 16 (oz) (Home Health) 1 x Per Day/30 Days ary Discharge Instructions: Moisten gauze with Dakin's solution and pack lightly into wound Secondary Dressing: Woven Gauze Sponge, Non-Sterile 4x4 in (Home Health) 1 x Per Day/30 Days Discharge Instructions: Apply over primary dressing as directed. Secondary Dressing: Zetuvit Plus Silicone Border Dressing 7x7(in/in) (Home Health) 1 x Per Day/30 Days Discharge Instructions: Apply silicone border over primary dressing as directed. Patient Medications llergies: No Known Allergies A Notifications Medication Indication Start End 11/26/2021 ketoconazole DOSE 1 - topical 2 % cream - 1 application daily to the area of irritation surrounding the wound bed Electronic Signature(s) Signed: 11/27/2021 9:35:33 AM By: Doris Shan DO Previous Signature: 11/26/2021 4:29:51 PM Version By: Doris Shan DO Entered By: [name] on 11/26/2021 16:30:11 -------------------------------------------------------------------------------- Problem List Details Patient Name: Date of Service: [name], [name] 11/26/2021 3:15 PM Medical Record Number: [id number] Patient Account Number: [id number] Date of Birth/Sex: Treating RN: [birth year] (60 y.o. [name] Primary Care Provider: Melissa Lopez Other Clinician: Referring Provider: Treating Provider/Extender: [name] [name]: 22 Active Problems ICD-10 Encounter Code Description Active Date MDM Diagnosis E10.622 Type 1 diabetes mellitus with other skin ulcer 10/29/2021 No Yes L89.153 Pressure ulcer of sacral region, stage 3 06/19/2021 No Yes Z89.511 Acquired absence of right leg below knee 06/19/2021 No Yes Z94.0 Kidney transplant status 06/19/2021 No Yes Inactive Problems Resolved Problems Electronic Signature(s) Signed: 11/27/2021 9:35:33 AM By: Doris Shan DO Entered By: [name] on 11/26/2021 16:24:12 -------------------------------------------------------------------------------- Progress Note Details Patient Name: Date of Service: [name], [name] 11/26/2021 3:15 PM Medical Record Number: [id number] Patient Account Number: [id number] Date of Birth/Sex: Treating RN: 09/25/1961 (60 y.o. F) Primary Care Provider: Melissa Lopez Other Clinician: Referring Provider:  Treating Provider/Extender: [name] [name]: 22 Subjective Chief Complaint Information obtained from Patient Sacral ulcer History of Present Illness (HPI) Admission 6/24 Ms. Rilyn Scroggs is a 60 year old female with a past medical history of type 1 diabetes, right BKA, and kidney transplant that presents to the clinic for an 23-month history of sacral ulcer. She has been using wet-to-dry dressings up until 5 weeks ago when she was started on a wound VAC. 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 She has completed 6 weeks of treatment and states she has been discharged by infectious disease. She currently denies any issues to the wound area. She reports minimal pain. She denies signs of infection. 7/11; patient presents for 2-week follow-up. She uses a wound VAC that is changed with home health 3 times a week. She denies any signs of infection. She has no complaints or issues today. 8/1;  patient presents for follow-up. She continues to use a wound VAC and this is changed with home health. She reports pain to the sacrum that started 1 week ago. She denies systemic signs of infection. 8/15; patient presents for 2-week follow-up. She has been using wet-to-dry dressings daily. She reports improvement in the wound size and appearance. She denies infectious symptoms. 9/12; patient presents for follow-up. She has been using wet-to-dry Dakin's moistened gauze. She reports following up with infectious disease and is currently taking amoxicillin. 10/6; patient presents for follow-up. She has been using Dakin's wet-to-dry moistened gauze dressings. She is still taking amoxicillin. She canceled her appointment with infectious disease yesterday because she is tired of going to doctors appointments. She has not picked up gentamicin cream from the pharmacy to use on the wound bed. She currently denies signs of infection. 11/3; patient presents for follow-up. She has been using Dakin's wet-to-dry dressings. She has no issues or complaints today. She denies signs of infection. 12/1; patient presents for follow-up. She continues to use Dakin's wet-to-dry dressings. Patient followed up with [name], ID and amoxicillin was stopped as she had completed her course for sacral osteomyelitis. She reports itching to the periwound. She denies signs of infection. Patient History Information obtained from Patient. Family History Cancer - Father, Diabetes - Father, Heart Disease - Maternal Grandparents, Hypertension - Mother, Thyroid Problems - Mother,Siblings, No family history of Hereditary Spherocytosis, Kidney Disease, Lung Disease, Seizures, Stroke, Tuberculosis. Social History Former smoker - from teens to 34's, Marital Status - Married, Alcohol Use - Rarely - beer, Drug Use - No History, Caffeine Use - Daily - coffee. Medical History Hematologic/Lymphatic Patient has history of  Anemia Endocrine Patient has history of Type I Diabetes - s Genitourinary Patient has history of End Stage Renal Disease - off dialysis since transplants Immunological Denies history of Lupus Erythematosus, Raynaudoos, Scleroderma Integumentary (Skin) Denies history of History of Burn Musculoskeletal Patient has history of Osteoarthritis, Osteomyelitis Neurologic Patient has history of Neuropathy Oncologic Patient has history of Received Radiation Denies history of Received Chemotherapy Hospitalization/Surgery History - infected wound (pressure) 2022. - (R) BKA amp Oct 2021. Objective Constitutional respirations regular, non-labored and within target range for patient.. Vitals Time Taken: 3:46 PM, Height: 53 in, Weight: 110 lbs, BMI: 27.5, Temperature: 98.1 F, Pulse: 90 bpm, Respiratory Rate: 16 breaths/min, Blood Pressure: 123/78 mmHg, Capillary Blood Glucose: 97 mg/dl. Psychiatric pleasant and cooperative. General Notes: Sacral region: Large open wound with granulation tissue present. Undermining circumferentially. Irritation to the periwound. No obvious signs of soft  tissue infection. Integumentary (Hair, Skin) Wound #1 status is Open. Original cause of wound was Pressure Injury. The date acquired was: 02/24/2021. The wound has been in treatment 22 weeks. The wound is located on the Sacrum. The wound measures 4.3cm length x 3.5cm width x 0.4cm depth; 11.82cm^2 area and 4.728cm^3 volume. There is Fat Layer (Subcutaneous Tissue) exposed. There is no tunneling noted, however, there is undermining starting at 1:00 and ending at 5:00 with a maximum distance of 1.5cm. There is a medium amount of purulent drainage noted. The wound margin is epibole. There is large (67-100%) red, pink granulation within the wound bed. There is a small (1-33%) amount of necrotic tissue within the wound bed including Adherent Slough. Assessment Active Problems ICD-10 Type 1 diabetes mellitus with other  skin ulcer Pressure ulcer of sacral region, stage 3 Acquired absence of right leg below knee Kidney transplant status Patient's wound has shown improvement in size and appearance since last clinic visit. She is no longer taking oral antibiotics. She is doing Dakin's wet-to-dry dressings. I recommended retrying the wound VAC and Patient would like to do this. In the meantime she can do Dakin's wet-to-dry. We will order the wound VAC and home health. Since she is having irritation and itching to the periwound I recommended ketoconazole. If this does not improve it then we will try TCA. No signs of infection on exam. Continue aggressive offloading. Follow-up in 3 weeks. She knows to call with any questions or concerns and can be seen sooner. Plan Follow-up Appointments: Return appointment in 3 weeks. - Dr. Heber [place] Cellular or Tissue Based Products: Cellular or Tissue Based Product Type: - Run IVR for Theraskin=Not Covered Bathing/ Shower/ Hygiene: Do not shower or bathe in tub. Negative Presssure Wound Therapy: Wound Vac to wound continuously at 120mm/hg pressure - Will order 11/26/21, HH to apply when obtained. Change dressing 2x week. Black Foam - May apply duoderm to periwound to help ensure seal Off-Loading: Turn and reposition every 2 hours Additional Orders / Instructions: Follow Nutritious Diet Home Health: New wound care orders this week; continue Home Health for wound care. May utilize formulary equivalent dressing for wound treatment orders unless otherwise specified. - Will order wound vac, when obtained HH to change 2x week. Other Home Health Orders/Instructions: - [facility] The following medication(s) was prescribed: ketoconazole topical 2 % cream 1 1 application daily to the area of irritation surrounding the wound bed starting 11/26/2021 WOUND #1: - Sacrum Wound Laterality: Cleanser: Wound Cleanser ([place]) 1 x Per Day/30 Days Discharge Instructions: Cleanse  the wound with wound cleanser prior to applying a clean dressing using gauze sponges, not tissue or cotton balls. Peri-Wound Care: Ketoconazole Cream 2% (Home Health) 1 x Per Day/30 Days Discharge Instructions: Apply Ketoconazole to periwound Prim Dressing: Dakin's Solution 0.125%, 16 (oz) (Home Health) 1 x Per Day/30 Days ary Discharge Instructions: Moisten gauze with Dakin's solution and pack lightly into wound Secondary Dressing: Woven Gauze Sponge, Non-Sterile 4x4 in (Home Health) 1 x Per Day/30 Days Discharge Instructions: Apply over primary dressing as directed. Secondary Dressing: Zetuvit Plus Silicone Border Dressing 7x7(in/in) (Home Health) 1 x Per Day/30 Days Discharge Instructions: Apply silicone border over primary dressing as directed. 1. Dakin's wet-to-dry until wound VAC is started 2. Ketoconazole to the periwound 3. Follow-up in 3 weeks Electronic Signature(s) Signed: 11/27/2021 9:35:33 AM By: Doris Shan DO Entered By: [name] on 11/26/2021 16:32:31 -------------------------------------------------------------------------------- HxROS Details Patient Name: Date of Service: [name] 11/26/2021 3:15  PM Medical Record Number: [id number] Patient Account Number: [id number] Date of Birth/Sex: Treating RN: [birth year] (60 y.o. F) Primary Care Provider: Melissa Lopez Other Clinician: Referring Provider: Treating Provider/Extender: [name] in Treatment: 22 Information Obtained From Patient Hematologic/Lymphatic Medical History: Positive for: Anemia Endocrine Medical History: Positive for: Type I Diabetes - s Time with diabetes: 81 years Treated with: Insulin Blood sugar tested every day: Yes Tested : constant with device Genitourinary Medical History: Positive for: End Stage Renal Disease - off dialysis since transplants Immunological Medical History: Negative for: Lupus Erythematosus; Raynauds;  Scleroderma Integumentary (Skin) Medical History: Negative for: History of Burn Musculoskeletal Medical History: Positive for: Osteoarthritis; Osteomyelitis Neurologic Medical History: Positive for: Neuropathy Oncologic Medical History: Positive for: Received Radiation Negative for: Received Chemotherapy Immunizations Pneumococcal Vaccine: Received Pneumococcal Vaccination: Yes Received Pneumococcal Vaccination On or After 60th Birthday: No Immunization Notes: states being up to date on all - unknown dates for immuization Implantable Devices None Hospitalization / Surgery History Type of Hospitalization/Surgery infected wound (pressure) 2022 (R) BKA amp Oct 2021 Family and Social History Cancer: Yes - Father; Diabetes: Yes - Father; Heart Disease: Yes - Maternal Grandparents; Hereditary Spherocytosis: No; Hypertension: Yes - Mother; Kidney Disease: No; Lung Disease: No; Seizures: No; Stroke: No; Thyroid Problems: Yes - Mother,Siblings; Tuberculosis: No; Former smoker - from teens to 13's; Marital Status - Married; Alcohol Use: Rarely - beer; Drug Use: No History; Caffeine Use: Daily - coffee; Financial Concerns: No; Food, Clothing or Shelter Needs: No; Support System Lacking: No; Transportation Concerns: No Electronic Signature(s) Signed: 11/27/2021 9:35:33 AM By: Doris Shan DO Entered By: [name] on 11/26/2021 16:26:49 -------------------------------------------------------------------------------- SuperBill Details Patient Name: Date of Service: [name], [name] 11/26/2021 Medical Record Number: [id number] Patient Account Number: [id number] Date of Birth/Sex: Treating RN: 04/25/1961 (60 y.o. [name] Primary Care Provider: Melissa Lopez Other Clinician: Referring Provider: Treating Provider/Extender: [name] [name]: 22 Diagnosis Coding ICD-10 Codes Code Description [phone] Type 1 diabetes mellitus  with other skin ulcer L89.153 Pressure ulcer of sacral region, stage 3 Z89.511 Acquired absence of right leg below knee Z94.0 Kidney transplant status Facility Procedures CPT4 Code: [id number] Description: 99213 - WOUND CARE VISIT-LEV 3 EST PT Modifier: Quantity: 1 Physician Procedures : CPT4 Code Description Modifier [id number] - WC PHYS LEVEL 3 - EST PT ICD-10 Diagnosis Description E10.622 Type 1 diabetes mellitus with other skin ulcer L89.153 Pressure ulcer of sacral region, stage 3 Z89.511 Acquired absence of right leg below  knee Z94.0 Kidney transplant status Quantity: 1 Electronic Signature(s) Signed: 11/27/2021 9:35:33 AM By: Doris Shan DO Entered By: [name] on 11/26/2021 16:32:47

## 2021-12-13 ENCOUNTER — Other Ambulatory Visit: Payer: Self-pay | Admitting: Infectious Disease

## 2021-12-14 ENCOUNTER — Ambulatory Visit (INDEPENDENT_AMBULATORY_CARE_PROVIDER_SITE_OTHER): Payer: BC Managed Care – PPO | Admitting: Orthopedic Surgery

## 2021-12-14 ENCOUNTER — Encounter: Payer: Self-pay | Admitting: Orthopedic Surgery

## 2021-12-14 ENCOUNTER — Telehealth: Payer: Self-pay

## 2021-12-14 ENCOUNTER — Other Ambulatory Visit: Payer: Self-pay

## 2021-12-14 DIAGNOSIS — Z89511 Acquired absence of right leg below knee: Secondary | ICD-10-CM | POA: Diagnosis not present

## 2021-12-14 NOTE — Telephone Encounter (Signed)
Attempted to call patient to verify but 11/16 visit Dr Tommy Medal states STOP Amoxicillin. I recvd refill request and declined this as we did not prescribe the abx and verified with Dr Tommy Medal that he does not want to fill. He states we need a reason. Left her vm asking her to call and give [place] more info.

## 2021-12-14 NOTE — Progress Notes (Signed)
Office Visit Note   Patient: [name]           Date of Birth: 03/29/1961           MRN: [id number] Visit Date: 12/14/2021              Requested by: [name], 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  Patient denies any problems she is getting ready to be fit for a prosthesis with [employer].  Assessment & Plan: Visit Diagnoses: No diagnosis found.  Plan: Patient was given a prescription for a right transtibial prosthesis with [employer].  She will call when she has been fit with her prosthesis and we will set her up for gait training.  Follow-Up Instructions: No follow-ups on file.   Ortho Exam  Patient is alert, oriented, no adenopathy, well-dressed, normal affect, normal respiratory effort. Examination the residual limb is well-healed there is no swelling no cellulitis no open ulcers.  Patient is a new right transtibial  amputee.  Patient's current comorbidities are not expected to impact the ability to function with the prescribed prosthesis. Patient verbally communicates a strong desire to use a prosthesis. Patient currently requires mobility aids to ambulate without a prosthesis.  Expects not to use mobility aids with a new prosthesis.  Patient is a K2 level ambulator that will use a prosthesis to walk around their home and the community over low level environmental barriers.      Imaging: No results found. No images are attached to the encounter.  Labs: Lab Results  Component Value Date   HGBA1C 7.9 (H) 02/25/2021   HGBA1C 8.6 (H) 12/08/2020   HGBA1C 7.2 (H) 09/26/2020   ESRSEDRATE 99 (H) 11/09/2021   ESRSEDRATE 94 (H) 08/25/2021   ESRSEDRATE 136 (H) 01/01/2021   CRP 7.5 11/09/2021   CRP 17.2 (H) 08/25/2021   CRP  6.9 (H) 01/01/2021   REPTSTATUS 09/12/2021 FINAL 09/07/2021   GRAMSTAIN  09/07/2021    RARE WBC PRESENT, PREDOMINANTLY PMN NO ORGANISMS SEEN    CULT  09/07/2021    RARE METHICILLIN RESISTANT STAPHYLOCOCCUS AUREUS WITHIN MIXED ORGANISMS Performed at [facility], [place] [address], [place], [place] [address]    LABORGA METHICILLIN RESISTANT STAPHYLOCOCCUS AUREUS 09/07/2021     Lab Results  Component Value Date   ALBUMIN 2.1 (L) 03/03/2021   ALBUMIN 1.9 (L) 03/02/2021   ALBUMIN 2.0 (L) 03/01/2021   PREALBUMIN 7.2 (L) 12/08/2020    Lab Results  Component Value Date   MG 1.8 02/28/2021   MG 1.8 02/27/2021   MG 1.8 02/27/2021   No results found for: [facility]  Lab Results  Component Value Date   PREALBUMIN 7.2 (L) 12/08/2020   CBC EXTENDED Latest Ref Rng & Units 11/09/2021 08/25/2021 03/03/2021  WBC 3.8 - 10.8 Thousand/uL 5.1 5.3 4.9  RBC 3.80 - 5.10 Million/uL 2.73(L) 2.80(L) 3.16(L)  HGB 11.7 - 15.5 g/dL 10.0(L) 10.3(L) 9.8(L)  HCT 35.0 - 45.0 % 30.2(L) 30.5(L) 32.4(L)  PLT 140 - 400 Thousand/uL 237 215 344  NEUTROABS 1,500 - 7,800 cells/uL 3,412 2,730 -  LYMPHSABS 850 - 3,900 cells/uL 1,290 1,903 -     There is no height or weight on file  to calculate BMI.  Orders:  No orders of the defined types were placed in this encounter.  No orders of the defined types were placed in this encounter.    Procedures: No procedures performed  Clinical Data: No additional findings.  ROS:  All other systems negative, except as noted in the HPI. Review of Systems  Objective: Vital Signs: There were no vitals taken for this visit.  Specialty Comments:  No specialty comments available.  PMFS History: Patient Active Problem List   Diagnosis Date Noted   Chronic osteomyelitis of sacrum ([place]) 08/25/2021   Acute lower UTI 02/25/2021   Anemia 02/25/2021   Goals of care, counseling/discussion    Palliative care by specialist    SIRS (systemic inflammatory response  syndrome) ([place]) 01/18/2021   Malnutrition of moderate degree 01/07/2021   Acute osteomyelitis of sacrum (HCC)    Failure to thrive in adult    Unspecified severe protein-calorie malnutrition ([place]) 70/96/2836   Acute metabolic encephalopathy 62/94/7654   Sacral decubitus ulcer, stage IV ([place]) 12/08/2020   Cellulitis 12/08/2020   Hypotension 12/08/2020   Malodorous urine 12/08/2020   Erosive esophagitis 12/08/2020   Hypothyroid 12/08/2020   Depression 12/08/2020   Type 1 diabetes mellitus with hyperlipidemia ([place]) 12/08/2020   Sepsis ([place]) 12/07/2020   Hypoalbuminemia 12/07/2020   Hyponatremia 12/07/2020   CAP (community acquired pneumonia) 12/07/2020   Acute urinary retention 12/07/2020   Coffee ground emesis    Gastroesophageal reflux disease with esophagitis and hemorrhage    Long-term use of immunosuppressant medication    GI bleed 11/08/2020   AKI (acute kidney injury) ([place]) 10/29/2020   Gangrene of right foot ([place])    Subacute osteomyelitis of right foot ([place])    Type 1 diabetes mellitus with complication, with long term current use of insulin pump ([place]) 09/27/2020   Pressure ulcer of BKA stump ([place]) 09/27/2020   Diabetic ketoacidosis ([place]) 09/26/2020   Acute kidney injury superimposed on CKD ([place]) 09/26/2020   Cellulitis and abscess of right leg 09/26/2020   Anemia secondary to renal failure 05/14/2020   S/p cadaver renal transplant 05/14/2020   Chronic kidney disease (CKD) stage G3a/A1, moderately decreased glomerular filtration rate (GFR) between 45-59 mL/min/1.73 square meter and albuminuria creatinine ratio less than 30 mg/g ([place]) 05/14/2020   Hypertension 05/14/2020   Past Medical History:  Diagnosis Date   Anemia of chronic renal failure    Chronic osteomyelitis of sacrum ([place]) 08/25/2021   DDD (degenerative disc disease), lumbar    Diabetes mellitus without complication ([place])    type 1   Gait disorder    neurogenic   Hypertension    Hypothyroid 12/08/2020    Neuropathy    Renal disease    Sacral decubitus ulcer, stage IV ([place]) 12/08/2020    Family History  Problem Relation Age of Onset   Hypertension Mother    Hypertension Father     Past Surgical History:  Procedure Laterality Date   AMPUTATION Right 10/01/2020   Procedure: RIGHT BELOW KNEE AMPUTATION;  Surgeon: Newt Minion, MD;  Location: [facility];  Service: Orthopedics;  Laterality: Right;   BLADDER SURGERY     ESOPHAGOGASTRODUODENOSCOPY (EGD) WITH PROPOFOL N/A 11/10/2020   Procedure: ESOPHAGOGASTRODUODENOSCOPY (EGD) WITH PROPOFOL;  Surgeon: Irene Shipper, MD;  Location: [facility];  Service: Endoscopy;  Laterality: N/A;   IR GASTROSTOMY TUBE REMOVAL  03/25/2021   KIDNEY TRANSPLANT     LEG SURGERY     tib-fib fx , ORIF   Social History  Occupational History   Not on file  Tobacco Use   Smoking status: Never   Smokeless tobacco: Never  Vaping Use   Vaping Use: Never used  Substance and Sexual Activity   Alcohol use: Never   Drug use: Never   Sexual activity: Not Currently    Birth control/protection: None

## 2021-12-15 ENCOUNTER — Encounter (HOSPITAL_BASED_OUTPATIENT_CLINIC_OR_DEPARTMENT_OTHER): Payer: BC Managed Care – PPO | Admitting: Internal Medicine

## 2021-12-16 NOTE — Telephone Encounter (Signed)
Reached out to patient regarding the need for antibiotic refill. Patient states she has a bladder infection and her nurse came to the home today. Patient understands that [facility] is ID and previously treated her osteo of sacrum and we should not have been contacted initially.  [name]

## 2021-12-17 ENCOUNTER — Encounter (HOSPITAL_COMMUNITY): Payer: Self-pay | Admitting: Emergency Medicine

## 2021-12-17 ENCOUNTER — Other Ambulatory Visit: Payer: Self-pay

## 2021-12-17 ENCOUNTER — Emergency Department (HOSPITAL_COMMUNITY): Payer: BC Managed Care – PPO

## 2021-12-17 ENCOUNTER — Inpatient Hospital Stay (HOSPITAL_COMMUNITY)
Admission: EM | Admit: 2021-12-17 | Discharge: 2021-12-22 | DRG: 698 | Disposition: A | Discharge status: Home Health | Payer: BC Managed Care – PPO | Attending: Internal Medicine | Admitting: Internal Medicine

## 2021-12-17 DIAGNOSIS — Z89511 Acquired absence of right leg below knee: Secondary | ICD-10-CM

## 2021-12-17 DIAGNOSIS — M4628 Osteomyelitis of vertebra, sacral and sacrococcygeal region: Secondary | ICD-10-CM | POA: Diagnosis present

## 2021-12-17 DIAGNOSIS — N189 Chronic kidney disease, unspecified: Secondary | ICD-10-CM

## 2021-12-17 DIAGNOSIS — N179 Acute kidney failure, unspecified: Secondary | ICD-10-CM | POA: Diagnosis present

## 2021-12-17 DIAGNOSIS — N3000 Acute cystitis without hematuria: Secondary | ICD-10-CM | POA: Diagnosis not present

## 2021-12-17 DIAGNOSIS — Z66 Do not resuscitate: Secondary | ICD-10-CM

## 2021-12-17 DIAGNOSIS — T83511A Infection and inflammatory reaction due to indwelling urethral catheter, initial encounter: Principal | ICD-10-CM | POA: Diagnosis present

## 2021-12-17 DIAGNOSIS — L89312 Pressure ulcer of right buttock, stage 2: Secondary | ICD-10-CM | POA: Diagnosis present

## 2021-12-17 DIAGNOSIS — Z1611 Resistance to penicillins: Secondary | ICD-10-CM | POA: Diagnosis present

## 2021-12-17 DIAGNOSIS — B961 Klebsiella pneumoniae [K. pneumoniae] as the cause of diseases classified elsewhere: Secondary | ICD-10-CM | POA: Diagnosis present

## 2021-12-17 DIAGNOSIS — E1040 Type 1 diabetes mellitus with diabetic neuropathy, unspecified: Secondary | ICD-10-CM | POA: Diagnosis present

## 2021-12-17 DIAGNOSIS — E10649 Type 1 diabetes mellitus with hypoglycemia without coma: Secondary | ICD-10-CM | POA: Diagnosis not present

## 2021-12-17 DIAGNOSIS — E1065 Type 1 diabetes mellitus with hyperglycemia: Secondary | ICD-10-CM | POA: Diagnosis not present

## 2021-12-17 DIAGNOSIS — K209 Esophagitis, unspecified without bleeding: Secondary | ICD-10-CM | POA: Diagnosis present

## 2021-12-17 DIAGNOSIS — T8619 Other complication of kidney transplant: Secondary | ICD-10-CM | POA: Diagnosis present

## 2021-12-17 DIAGNOSIS — Z79890 Hormone replacement therapy (postmenopausal): Secondary | ICD-10-CM

## 2021-12-17 DIAGNOSIS — T8613 Kidney transplant infection: Secondary | ICD-10-CM | POA: Diagnosis present

## 2021-12-17 DIAGNOSIS — Z8249 Family history of ischemic heart disease and other diseases of the circulatory system: Secondary | ICD-10-CM

## 2021-12-17 DIAGNOSIS — Z993 Dependence on wheelchair: Secondary | ICD-10-CM

## 2021-12-17 DIAGNOSIS — Z794 Long term (current) use of insulin: Secondary | ICD-10-CM

## 2021-12-17 DIAGNOSIS — B964 Proteus (mirabilis) (morganii) as the cause of diseases classified elsewhere: Secondary | ICD-10-CM | POA: Diagnosis present

## 2021-12-17 DIAGNOSIS — Z940 Kidney transplant status: Secondary | ICD-10-CM

## 2021-12-17 DIAGNOSIS — E872 Acidosis, unspecified: Secondary | ICD-10-CM | POA: Diagnosis present

## 2021-12-17 DIAGNOSIS — D649 Anemia, unspecified: Secondary | ICD-10-CM | POA: Diagnosis present

## 2021-12-17 DIAGNOSIS — E108 Type 1 diabetes mellitus with unspecified complications: Secondary | ICD-10-CM

## 2021-12-17 DIAGNOSIS — I129 Hypertensive chronic kidney disease with stage 1 through stage 4 chronic kidney disease, or unspecified chronic kidney disease: Secondary | ICD-10-CM | POA: Diagnosis present

## 2021-12-17 DIAGNOSIS — E440 Moderate protein-calorie malnutrition: Secondary | ICD-10-CM | POA: Diagnosis present

## 2021-12-17 DIAGNOSIS — Y846 Urinary catheterization as the cause of abnormal reaction of the patient, or of later complication, without mention of misadventure at the time of the procedure: Secondary | ICD-10-CM | POA: Diagnosis present

## 2021-12-17 DIAGNOSIS — R627 Adult failure to thrive: Secondary | ICD-10-CM | POA: Diagnosis present

## 2021-12-17 DIAGNOSIS — L89154 Pressure ulcer of sacral region, stage 4: Secondary | ICD-10-CM | POA: Diagnosis present

## 2021-12-17 DIAGNOSIS — Z20822 Contact with and (suspected) exposure to covid-19: Secondary | ICD-10-CM | POA: Diagnosis present

## 2021-12-17 DIAGNOSIS — R6520 Severe sepsis without septic shock: Secondary | ICD-10-CM | POA: Diagnosis present

## 2021-12-17 DIAGNOSIS — E039 Hypothyroidism, unspecified: Secondary | ICD-10-CM | POA: Diagnosis present

## 2021-12-17 DIAGNOSIS — Z7982 Long term (current) use of aspirin: Secondary | ICD-10-CM

## 2021-12-17 DIAGNOSIS — Z7952 Long term (current) use of systemic steroids: Secondary | ICD-10-CM

## 2021-12-17 DIAGNOSIS — Z978 Presence of other specified devices: Secondary | ICD-10-CM | POA: Diagnosis not present

## 2021-12-17 DIAGNOSIS — A419 Sepsis, unspecified organism: Secondary | ICD-10-CM | POA: Diagnosis present

## 2021-12-17 DIAGNOSIS — L89622 Pressure ulcer of left heel, stage 2: Secondary | ICD-10-CM | POA: Diagnosis present

## 2021-12-17 DIAGNOSIS — K920 Hematemesis: Secondary | ICD-10-CM | POA: Diagnosis present

## 2021-12-17 DIAGNOSIS — D631 Anemia in chronic kidney disease: Secondary | ICD-10-CM | POA: Diagnosis present

## 2021-12-17 DIAGNOSIS — N182 Chronic kidney disease, stage 2 (mild): Secondary | ICD-10-CM | POA: Diagnosis present

## 2021-12-17 DIAGNOSIS — E1022 Type 1 diabetes mellitus with diabetic chronic kidney disease: Secondary | ICD-10-CM | POA: Diagnosis present

## 2021-12-17 DIAGNOSIS — Z7960 Long term (current) use of unspecified immunomodulators and immunosuppressants: Secondary | ICD-10-CM

## 2021-12-17 DIAGNOSIS — Z681 Body mass index (BMI) 19 or less, adult: Secondary | ICD-10-CM

## 2021-12-17 DIAGNOSIS — N1832 Chronic kidney disease, stage 3b: Secondary | ICD-10-CM | POA: Diagnosis present

## 2021-12-17 DIAGNOSIS — D84821 Immunodeficiency due to drugs: Secondary | ICD-10-CM | POA: Diagnosis present

## 2021-12-17 DIAGNOSIS — E1069 Type 1 diabetes mellitus with other specified complication: Secondary | ICD-10-CM

## 2021-12-17 DIAGNOSIS — M8668 Other chronic osteomyelitis, other site: Secondary | ICD-10-CM | POA: Diagnosis present

## 2021-12-17 DIAGNOSIS — Z9641 Presence of insulin pump (external) (internal): Secondary | ICD-10-CM | POA: Diagnosis present

## 2021-12-17 DIAGNOSIS — N10 Acute tubulo-interstitial nephritis: Secondary | ICD-10-CM

## 2021-12-17 DIAGNOSIS — N1831 Chronic kidney disease, stage 3a: Secondary | ICD-10-CM

## 2021-12-17 DIAGNOSIS — E875 Hyperkalemia: Secondary | ICD-10-CM | POA: Diagnosis present

## 2021-12-17 DIAGNOSIS — Y830 Surgical operation with transplant of whole organ as the cause of abnormal reaction of the patient, or of later complication, without mention of misadventure at the time of the procedure: Secondary | ICD-10-CM | POA: Diagnosis present

## 2021-12-17 HISTORY — DX: Gangrene, not elsewhere classified: I96

## 2021-12-17 HISTORY — DX: Subacute osteomyelitis, right ankle and foot: M86.271

## 2021-12-17 LAB — COMPREHENSIVE METABOLIC PANEL
ALT: 10 U/L (ref 0–44)
AST: 18 U/L (ref 15–41)
Albumin: 3.3 g/dL — ABNORMAL LOW (ref 3.5–5.0)
Alkaline Phosphatase: 94 U/L (ref 38–126)
Anion gap: 9 (ref 5–15)
BUN: 56 mg/dL — ABNORMAL HIGH (ref 8–23)
CO2: 24 mmol/L (ref 22–32)
Calcium: 9.9 mg/dL (ref 8.9–10.3)
Chloride: 105 mmol/L (ref 98–111)
Creatinine, Ser: 1.43 mg/dL — ABNORMAL HIGH (ref 0.44–1.00)
GFR, Estimated: 41 mL/min — ABNORMAL LOW (ref >60)
Glucose, Bld: 178 mg/dL — ABNORMAL HIGH (ref 70–99)
Potassium: 4.6 mmol/L (ref 3.5–5.1)
Sodium: 138 mmol/L (ref 135–145)
Total Bilirubin: 1 mg/dL (ref 0.3–1.2)
Total Protein: 6.8 g/dL (ref 6.5–8.1)

## 2021-12-17 LAB — CBC WITH DIFFERENTIAL/PLATELET
Abs Immature Granulocytes: 0.06 10*3/uL (ref 0.00–0.07)
Basophils Absolute: 0 10*3/uL (ref 0.0–0.1)
Basophils Relative: 0 %
Eosinophils Absolute: 0.1 10*3/uL (ref 0.0–0.5)
Eosinophils Relative: 1 %
HCT: 35.9 % — ABNORMAL LOW (ref 36.0–46.0)
Hemoglobin: 11.6 g/dL — ABNORMAL LOW (ref 12.0–15.0)
Immature Granulocytes: 1 %
Lymphocytes Relative: 11 %
Lymphs Abs: 1 10*3/uL (ref 0.7–4.0)
MCH: 36.7 pg — ABNORMAL HIGH (ref 26.0–34.0)
MCHC: 32.3 g/dL (ref 30.0–36.0)
MCV: 113.6 fL — ABNORMAL HIGH (ref 80.0–100.0)
Monocytes Absolute: 0.5 10*3/uL (ref 0.1–1.0)
Monocytes Relative: 5 %
Neutro Abs: 7.7 10*3/uL (ref 1.7–7.7)
Neutrophils Relative %: 82 %
Platelets: 222 10*3/uL (ref 150–400)
RBC: 3.16 MIL/uL — ABNORMAL LOW (ref 3.87–5.11)
RDW: 14.8 % (ref 11.5–15.5)
WBC: 9.4 10*3/uL (ref 4.0–10.5)
nRBC: 0 % (ref 0.0–0.2)

## 2021-12-17 LAB — URINALYSIS, ROUTINE W REFLEX MICROSCOPIC
Bilirubin Urine: NEGATIVE
Glucose, UA: NEGATIVE mg/dL
Ketones, ur: NEGATIVE mg/dL
Nitrite: NEGATIVE
Protein, ur: 100 mg/dL — AB
Specific Gravity, Urine: <1.005 — ABNORMAL LOW (ref 1.005–1.030)
pH: 8.5 — ABNORMAL HIGH (ref 5.0–8.0)

## 2021-12-17 LAB — TSH: TSH: 13.264 u[IU]/mL — ABNORMAL HIGH (ref 0.350–4.500)

## 2021-12-17 LAB — LIPASE, BLOOD: Lipase: 32 U/L (ref 11–51)

## 2021-12-17 LAB — CBG MONITORING, ED: Glucose-Capillary: 152 mg/dL — ABNORMAL HIGH (ref 70–99)

## 2021-12-17 LAB — LACTIC ACID, PLASMA: Lactic Acid, Venous: 1.7 mmol/L (ref 0.5–1.9)

## 2021-12-17 MED ORDER — ACETAMINOPHEN 650 MG RE SUPP: 650.0000 mg | Freq: Four times a day (QID) | RECTAL | Status: DC | PRN

## 2021-12-17 MED ORDER — PANTOPRAZOLE SODIUM 40 MG IV SOLR
40.0000 mg | Freq: Two times a day (BID) | INTRAVENOUS | Status: DC
  Administered 2021-12-18: 10:00:00 40 mg via INTRAVENOUS
  Filled 2021-12-17: qty 40

## 2021-12-17 MED ORDER — ONDANSETRON HCL 4 MG/2ML IJ SOLN
4.0000 mg | Freq: Once | INTRAMUSCULAR | Status: AC
  Administered 2021-12-17: 17:00:00 4 mg via INTRAVENOUS
  Filled 2021-12-17: qty 2

## 2021-12-17 MED ORDER — PANTOPRAZOLE SODIUM 40 MG IV SOLR
40.0000 mg | Freq: Once | INTRAVENOUS | Status: AC
  Administered 2021-12-17: 21:00:00 40 mg via INTRAVENOUS
  Filled 2021-12-17: qty 40

## 2021-12-17 MED ORDER — AZATHIOPRINE 50 MG PO TABS
50.0000 mg | ORAL_TABLET | Freq: Every day | ORAL | Status: DC
  Administered 2021-12-18 – 2021-12-22 (×5): 50 mg via ORAL
  Filled 2021-12-17 (×5): qty 1

## 2021-12-17 MED ORDER — MELATONIN 5 MG PO TABS
10.0000 mg | ORAL_TABLET | Freq: Every evening | ORAL | Status: DC | PRN
  Administered 2021-12-18: 22:00:00 10 mg via ORAL
  Filled 2021-12-17: qty 2

## 2021-12-17 MED ORDER — PREDNISONE 5 MG PO TABS
5.0000 mg | ORAL_TABLET | Freq: Every day | ORAL | Status: DC
  Administered 2021-12-18 – 2021-12-22 (×5): 5 mg via ORAL
  Filled 2021-12-17 (×6): qty 1

## 2021-12-17 MED ORDER — SODIUM CHLORIDE 0.9 % IV SOLN: INTRAVENOUS | Status: AC

## 2021-12-17 MED ORDER — HYDROMORPHONE HCL 1 MG/ML IJ SOLN
1.0000 mg | Freq: Once | INTRAMUSCULAR | Status: AC
  Administered 2021-12-17: 19:00:00 1 mg via INTRAVENOUS
  Filled 2021-12-17: qty 1

## 2021-12-17 MED ORDER — IOHEXOL 350 MG/ML SOLN
75.0000 mL | Freq: Once | INTRAVENOUS | Status: AC | PRN
  Administered 2021-12-17: 19:00:00 75 mL via INTRAVENOUS

## 2021-12-17 MED ORDER — SODIUM CHLORIDE 0.9 % IV SOLN
1.0000 g | Freq: Once | INTRAVENOUS | Status: DC
  Administered 2021-12-17: 22:00:00 1 g via INTRAVENOUS
  Filled 2021-12-17: qty 10

## 2021-12-17 MED ORDER — SODIUM CHLORIDE 0.9 % IV BOLUS
500.0000 mL | Freq: Once | INTRAVENOUS | Status: AC
  Administered 2021-12-17: 17:00:00 500 mL via INTRAVENOUS

## 2021-12-17 MED ORDER — ACETAMINOPHEN 325 MG PO TABS
650.0000 mg | ORAL_TABLET | Freq: Four times a day (QID) | ORAL | Status: DC | PRN
  Filled 2021-12-17: qty 2

## 2021-12-17 MED ORDER — CYCLOSPORINE MODIFIED (NEORAL) 25 MG PO CAPS
75.0000 mg | ORAL_CAPSULE | Freq: Two times a day (BID) | ORAL | Status: DC
  Filled 2021-12-17: qty 3

## 2021-12-17 MED ORDER — SODIUM CHLORIDE 0.9 % IV SOLN: INTRAVENOUS | Status: DC

## 2021-12-17 MED ORDER — ONDANSETRON HCL 4 MG PO TABS: 4.0000 mg | ORAL_TABLET | Freq: Four times a day (QID) | ORAL | Status: DC | PRN

## 2021-12-17 MED ORDER — ONDANSETRON HCL 4 MG/2ML IJ SOLN
4.0000 mg | Freq: Four times a day (QID) | INTRAMUSCULAR | Status: DC | PRN
  Administered 2021-12-18 – 2021-12-19 (×3): 4 mg via INTRAVENOUS
  Filled 2021-12-17 (×3): qty 2

## 2021-12-17 NOTE — Assessment & Plan Note (Signed)
Patient has an insulin pump.

## 2021-12-17 NOTE — Assessment & Plan Note (Signed)
Chronic. 

## 2021-12-17 NOTE — ED Notes (Signed)
CT staff informed RN patient's dexcom monitor and insulin pump was removed from the patient.

## 2021-12-17 NOTE — Assessment & Plan Note (Signed)
Patient states that she will be going to the [facility] clinic in January 2023 to be fitted for a prosthesis.

## 2021-12-17 NOTE — Assessment & Plan Note (Addendum)
Patient with coffee-ground emesis on her close.  Patient has a history of esophagitis.  Has circumferential thickening of the distal esophagus seen on CT.  Start IV Protonix..  Repeat CBC in the morning.  May need a GI consult in the morning.  Keep n.p.o. in case she needs an EGD.

## 2021-12-17 NOTE — Assessment & Plan Note (Addendum)
Unfortunately, patient's UA, culture and subsequent IV antibiotics were performed on urine that was obtained from her chronic Foley catheter had been in place for the last 3 and half weeks.  Foley catheter will be changed out tonight in the ER and a new UA will be sent.  Patient not having any symptoms of a urinary tract infection.  We will hold on any further antibiotics for now.  Patient states that she has a chronic Foley due to her inability to get to the bathroom because of her right below the knee amputation.

## 2021-12-17 NOTE — Assessment & Plan Note (Signed)
Chronic.  Baseline creatinine approximately 1-1.2

## 2021-12-17 NOTE — Assessment & Plan Note (Addendum)
Present on admission.  Patient follow-up with infectious disease.  We will have wound care see the patient as well.

## 2021-12-17 NOTE — Assessment & Plan Note (Addendum)
Follow-up with infectious disease.  Chronic.

## 2021-12-17 NOTE — Assessment & Plan Note (Signed)
Continue with Imuran, prednisone, cyclosporine.

## 2021-12-17 NOTE — Assessment & Plan Note (Addendum)
Assign to Observation status.  Continue IV fluids. Repeat BMP in AM.  Patient has urothelial thickening of the transplanted ureter.  Possibly infection versus rejection.  Patient was given 1 dose of IV Rocephin in the ER based on a urine analysis on the chronic Foley.  We will hold on further antibiotic therapy until nephrology can see the patient and determine if the patient needs any further antibiotic.

## 2021-12-17 NOTE — ED Notes (Signed)
Call back to Nephrology Justin Mend MD delivered to [facility] MD

## 2021-12-17 NOTE — Assessment & Plan Note (Addendum)
Chronic.  Continue Imuran, prednisone, cyclosporine.  Nephrology notified of patient's admission.  Patient sees Dr. Posey Pronto with [facility].

## 2021-12-17 NOTE — ED Notes (Signed)
ED TO INPATIENT HANDOFF REPORT  Name/Age/Gender [name] 60 y.o. female  Code Status    Code Status Orders  (From admission, onward)           Start     Ordered   12/17/21 2341  Do not attempt resuscitation (DNR)  Continuous       Question Answer Comment  In the event of cardiac or respiratory ARREST Do not call a code blue   In the event of cardiac or respiratory ARREST Do not perform Intubation, CPR, defibrillation or ACLS   In the event of cardiac or respiratory ARREST Use medication by any route, position, wound care, and other measures to relive pain and suffering. May use oxygen, suction and manual treatment of airway obstruction as needed for comfort.   Comments verified with pt that she is DNR/DNI      12/17/21 2340           Code Status History     Date Active Date Inactive Code Status Order ID Comments User Context   12/17/2021 2148 12/17/2021 2340 DNR [id number]  Kristopher Oppenheim, DO ED   02/26/2021 1156 03/03/2021 1947 DNR [id number]  Georgette Shell, MD Inpatient   02/25/2021 2318 02/26/2021 1156 Full Code [id number]  Etta Quill, DO ED   01/18/2021 2252 01/24/2021 1849 Full Code [id number]  Reubin Milan, MD ED   12/08/2020 0052 01/09/2021 0041 Full Code [id number]  Toy Baker, MD ED   11/09/2020 0144 11/13/2020 0423 Full Code [id number]  Flora Lipps, MD ED   10/29/2020 1408 11/08/2020 0236 Full Code [id number]  Collier Bullock, MD ED   09/26/2020 2244 10/06/2020 2300 Full Code [id number]  Barb Merino, MD ED       Home/SNF/Other Home  Chief Complaint AKI (acute kidney injury) ([place]) [N17.9]  Level of Care/Admitting Diagnosis ED Disposition     ED Disposition  Admit   Condition  --   [facility] Area: [facility] [[id number]]  Level of Care: Med-Surg [16]  May place patient in observation at [facility] or [facility] if equivalent level of care is available:: Yes  Covid Evaluation: Asymptomatic Screening  Protocol (No Symptoms)  Diagnosis: AKI (acute kidney injury) [facility]) [[id number]]  Admitting Physician: Bridgett Larsson, [place]  Attending Physician: Bridgett Larsson, [facility] [[id number]]          Medical History Past Medical History:  Diagnosis Date   Anemia of chronic renal failure    Cellulitis and abscess of right leg 09/26/2020   Chronic osteomyelitis of sacrum ([place]) 08/25/2021   DDD (degenerative disc disease), lumbar    Diabetes mellitus without complication ([place])    type 1   Diabetic ketoacidosis ([place]) 09/26/2020   Gait disorder    neurogenic   Gangrene of right foot ([place])    Hypertension    Hypothyroid 12/08/2020   Neuropathy    Renal disease    Sacral decubitus ulcer, stage IV ([place]) 12/08/2020   Subacute osteomyelitis of right foot ([place])     Allergies No Known Allergies  IV Location/Drains/Wounds Patient Lines/Drains/Airways Status     Active Line/Drains/Airways     Name Placement date Placement time Site Days   Peripheral IV 12/17/21 20 G Right;Anterior Antecubital 12/17/21  1838  Antecubital  less than 1   Gastrostomy/Enterostomy PEG-jejunostomy LUQ --  --  LUQ  --   Urethral Catheter L Bullins RN Non-latex 14 Fr. 01/23/21  1841  Non-latex  328   External Urinary Catheter 01/22/21  1700  --  329   Pressure Injury 12/10/20 Heel Left;Posterior Stage 2 -  Partial thickness loss of dermis presenting as a shallow open injury with a red, pink wound bed without slough. 12/10/20  1325  -- 372   Pressure Injury 12/24/20 Sacrum Stage 4 - Full thickness tissue loss with exposed bone, tendon or muscle. 12/24/20  --  -- 358   Pressure Injury 02/26/21 Buttocks Right Stage 2 -  Partial thickness loss of dermis presenting as a shallow open injury with a red, pink wound bed without slough. 02/26/21  0600  -- 294   Wound / Incision (Open or Dehisced) 11/12/20 (MASD) Moisture Associated Skin Damage Buttocks Right;Left 11/12/20  0944  Buttocks  400            Labs/Imaging Results for orders placed or  performed during the hospital encounter of 12/17/21 (from the past 48 hour(s))  CBG monitoring, ED     Status: Abnormal   Collection Time: 12/17/21  4:26 PM  Result Value Ref Range   Glucose-Capillary 152 (H) 70 - 99 mg/dL    Comment: Glucose reference range applies only to samples taken after fasting for at least 8 hours.  Lactic acid, plasma     Status: None   Collection Time: 12/17/21  5:18 PM  Result Value Ref Range   Lactic Acid, Venous 1.7 0.5 - 1.9 mmol/L    Comment: Performed at [facility], [place] [address] [place], [place] [address]  Comprehensive metabolic panel     Status: Abnormal   Collection Time: 12/17/21  5:19 PM  Result Value Ref Range   Sodium 138 135 - 145 mmol/L   Potassium 4.6 3.5 - 5.1 mmol/L   Chloride 105 98 - 111 mmol/L   CO2 24 22 - 32 mmol/L   Glucose, Bld 178 (H) 70 - 99 mg/dL    Comment: Glucose reference range applies only to samples taken after fasting for at least 8 hours.   BUN 56 (H) 8 - 23 mg/dL   Creatinine, Ser 1.43 (H) 0.44 - 1.00 mg/dL   Calcium 9.9 8.9 - 10.3 mg/dL   Total Protein 6.8 6.5 - 8.1 g/dL   Albumin 3.3 (L) 3.5 - 5.0 g/dL   AST 18 15 - 41 U/L   ALT 10 0 - 44 U/L   Alkaline Phosphatase 94 38 - 126 U/L   Total Bilirubin 1.0 0.3 - 1.2 mg/dL   GFR, Estimated 41 (L) >60 mL/min    Comment: (NOTE) Calculated using the CKD-EPI Creatinine Equation (2021)    Anion gap 9 5 - 15    Comment: Performed at [facility], [place] [address], [place], [place] [address]  CBC with Differential/Platelet     Status: Abnormal   Collection Time: 12/17/21  5:19 PM  Result Value Ref Range   WBC 9.4 4.0 - 10.5 K/uL   RBC 3.16 (L) 3.87 - 5.11 MIL/uL   Hemoglobin 11.6 (L) 12.0 - 15.0 g/dL   HCT 35.9 (L) 36.0 - 46.0 %   MCV 113.6 (H) 80.0 - 100.0 fL   MCH 36.7 (H) 26.0 - 34.0 pg   MCHC 32.3 30.0 - 36.0 g/dL   RDW 14.8 11.5 - 15.5 %   Platelets 222 150 - 400 K/uL   nRBC 0.0 0.0 - 0.2 %   Neutrophils Relative % 82 %    Neutro Abs 7.7 1.7 - 7.7 K/uL   Lymphocytes Relative 11 %   Lymphs Abs 1.0 0.7 - 4.0  K/uL   Monocytes Relative 5 %   Monocytes Absolute 0.5 0.1 - 1.0 K/uL   Eosinophils Relative 1 %   Eosinophils Absolute 0.1 0.0 - 0.5 K/uL   Basophils Relative 0 %   Basophils Absolute 0.0 0.0 - 0.1 K/uL   Immature Granulocytes 1 %   Abs Immature Granulocytes 0.06 0.00 - 0.07 K/uL    Comment: Performed at [facility], [place] [address], [place], [place] [address]  Lipase, blood     Status: None   Collection Time: 12/17/21  5:19 PM  Result Value Ref Range   Lipase 32 11 - 51 U/L    Comment: Performed at [facility], [place] [address], [place], [place] [address]  TSH     Status: Abnormal   Collection Time: 12/17/21  5:19 PM  Result Value Ref Range   TSH 13.264 (H) 0.350 - 4.500 uIU/mL    Comment: Performed by a 3rd Generation assay with a functional sensitivity of <=0.01 uIU/mL. Performed at [facility], [place] [address], [place], [place] [address]   Urinalysis, Routine w reflex microscopic Urine, Catheterized     Status: Abnormal   Collection Time: 12/17/21  6:04 PM  Result Value Ref Range   Color, Urine YELLOW YELLOW   APPearance CLEAR CLEAR   Specific Gravity, Urine <1.005 (L) 1.005 - 1.030   pH 8.5 (H) 5.0 - 8.0   Glucose, UA NEGATIVE NEGATIVE mg/dL   Hgb urine dipstick TRACE (A) NEGATIVE   Bilirubin Urine NEGATIVE NEGATIVE   Ketones, ur NEGATIVE NEGATIVE mg/dL   Protein, ur 100 (A) NEGATIVE mg/dL   Nitrite NEGATIVE NEGATIVE   Leukocytes,Ua MODERATE (A) NEGATIVE   RBC / HPF 21-50 0 - 5 RBC/hpf   WBC, UA 21-50 0 - 5 WBC/hpf   Bacteria, UA MANY (A) NONE SEEN   Squamous Epithelial / LPF 0-5 0 - 5   Mucus PRESENT    Triple Phosphate Crystal PRESENT    Ca Oxalate Crys, UA PRESENT     Comment: Performed at [facility], [place] [address], [place], [place] [address]   CT Abdomen Pelvis W Contrast  Result Date:  12/17/2021 CLINICAL DATA:  Nausea/vomiting EXAM: CT ABDOMEN AND PELVIS WITH CONTRAST TECHNIQUE: Multidetector CT imaging of the abdomen and pelvis was performed using the standard protocol following bolus administration of intravenous contrast. CONTRAST:  56mL OMNIPAQUE IOHEXOL 350 MG/ML SOLN COMPARISON:  Ct abd/pelvis 02/25/21 FINDINGS: Lower chest: No acute abnormality. Marked diffuse circumferential thickening of the visualized distal esophagus with associated hyperenhancement of the mucosa. Hepatobiliary: No focal liver abnormality. Calcified gallstone noted within the gallbladder lumen. No gallbladder wall thickening or pericholecystic fluid. No biliary dilatation. Pancreas: No focal lesion. Normal pancreatic contour. No surrounding inflammatory changes. No main pancreatic ductal dilatation. Spleen: Normal in size without focal abnormality. Adrenals/Urinary Tract: No adrenal nodule bilaterally. Marked atrophy and scarring of bilateral native kidneys. Left iliac fossa renal transplant. Associated subcentimeter hypodensities are too small to characterize. No hydroureteronephrosis. Associated urothelial thickening of the transplanted ureter. The transplant kidney enhances homogeneously. No hydronephrosis. No hydroureter. The urinary bladder lumen is mildly distended with urine. Foci of gas noted within the urinary bladder lumen. Temperature probe noted terminating within the urinary bladder lumen. Stomach/Bowel: Stomach is within normal limits. No evidence of bowel wall thickening or dilatation. Stool throughout the colon. Appendix appears normal. 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 No abdominal aorta aneurysm. Mild atherosclerotic plaque of the  aorta and its branches. No abdominal, pelvic, or inguinal lymphadenopathy. Reproductive: Poorly visualized pelvis due to streak artifact originating from the right femoral surgical hardware. Other: No intraperitoneal free fluid. No  intraperitoneal free gas. No organized fluid collection. Musculoskeletal: Redemonstration of a deep sacral decubitus ulcer. No abscess formation. No cortical erosion or destruction. No suspicious lytic or blastic osseous lesions. No acute displaced fracture. Multilevel degenerative changes of the spine. IMPRESSION: 1. Urothelial thickening of the transplanted ureter. Findings concerning for infection. Foci of gas within the urinary bladder lumen likely related to temperature probe instrumentation. Differential diagnosis includes cystitis. Correlate with urinalysis. 2. Marked diffuse circumferential thickening of the visualized distal esophagus with associated hyperenhancement of the mucosa. Correlate with signs and symptoms of esophagitis. Underlying malignancy not excluded. 3. Cholelithiasis with no CT findings of acute cholecystitis. 4. Deep sacral decubitus ulcer. No abscess formation. 5.  Aortic Atherosclerosis (ICD10-I70.0). 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 Electronically Signed   By: Iven Finn M.D.   On: 12/17/2021 19:22   DG Chest Port 1 View  Result Date: 12/17/2021 CLINICAL DATA:  Nausea the started last night and all day today. EXAM: PORTABLE CHEST 1 VIEW COMPARISON:  02/25/2021 FINDINGS: Mild bibasilar atelectasis. No focal consolidation. No pleural effusion or pneumothorax. Heart and mediastinal contours are unremarkable. No acute osseous abnormality. Old right anterior rib fractures. Osteoarthritis of bilateral glenohumeral joints. IMPRESSION: No active disease. Electronically Signed   By: Kathreen Devoid M.D.   On: 12/17/2021 17:10    Pending Labs Unresulted Labs (From admission, onward)     Start     Ordered   12/18/21 0500  CBC with Differential/Platelet  Tomorrow morning,   R        12/17/21 2340   12/18/21 0500  Comprehensive metabolic panel  Tomorrow morning,   R        12/17/21 2340   12/18/21 0500  Procalcitonin  Daily,   R      12/17/21 2340    12/17/21 2342  Resp Panel by RT-PCR (Flu A&B, Covid) Urine, Catheterized  (Tier 2 - Symptomatic/asymptomatic)  Once,   R        12/17/21 2341   12/17/21 2341  Procalcitonin - Baseline  ONCE - STAT,   STAT        12/17/21 2340   12/17/21 2341  HIV Antibody (routine testing w rflx)  (HIV Antibody (Routine testing w reflex) panel)  Add-on,   AD        12/17/21 2340   12/17/21 2222  Urinalysis, Routine w reflex microscopic Urine, Catheterized  Once,   R       Comments: Obtain from new foley catheter    12/17/21 2222   12/17/21 1952  Urine Culture  Once,   STAT       Question:  Indication  Answer:  Dysuria   12/17/21 1951   12/17/21 1638  Culture, blood (Routine X 2) w Reflex to ID Panel  BLOOD CULTURE X 2,   R (with STAT occurrences)      12/17/21 1639            Vitals/Pain Today's Vitals   12/17/21 2010 12/17/21 2030 12/17/21 2100 12/17/21 2130  BP:  (!) 149/87 (!) 141/74 (!) 145/86  Pulse:  94 92 86  Resp:  17 13 16   Temp:      TempSrc:      SpO2:  99% 97% 98%  Weight:  Height:      PainSc: 2        Isolation Precautions No active isolations  Medications Medications  0.9 %  sodium chloride infusion (has no administration in time range)  predniSONE (DELTASONE) tablet 5 mg (has no administration in time range)  pantoprazole (PROTONIX) injection 40 mg (40 mg Intravenous Not Given 12/17/21 2354)  azaTHIOprine (IMURAN) tablet 50 mg (has no administration in time range)  cycloSPORINE modified (NEORAL) capsule 75 mg (has no administration in time range)  acetaminophen (TYLENOL) tablet 650 mg (has no administration in time range)    Or  acetaminophen (TYLENOL) suppository 650 mg (has no administration in time range)  ondansetron (ZOFRAN) tablet 4 mg (has no administration in time range)    Or  ondansetron (ZOFRAN) injection 4 mg (has no administration in time range)  melatonin tablet 10 mg (has no administration in time range)  sodium chloride 0.9 % bolus 500 mL (500  mLs Intravenous Bolus 12/17/21 1715)  ondansetron (ZOFRAN) injection 4 mg (4 mg Intravenous Given 12/17/21 1715)  HYDROmorphone (DILAUDID) injection 1 mg (1 mg Intravenous Given 12/17/21 1852)  iohexol (OMNIPAQUE) 350 MG/ML injection 75 mL (75 mLs Intravenous Contrast Given 12/17/21 1839)  pantoprazole (PROTONIX) injection 40 mg (40 mg Intravenous Given 12/17/21 2127)    Mobility walks with device

## 2021-12-17 NOTE — ED Provider Notes (Signed)
[facility] DEPT Provider Note   CSN: [id number] Arrival date & time: 12/17/21  1556     History Chief Complaint  Patient presents with   Nausea    [name] is a 60 y.o. female.  Patient with difficulty with nausea and dry heaves ongoing for couple months.  Worse in the last couple days.  Patient not eating and drinking very well.  Patient has chronic indwelling Foley catheter.  Patient's past medical history significant for type 1 diabetes.  Hypothyroidism.  Sacral decubitus followed by infectious disease which at last visit felt was improving.  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  She is not on dialysis.  Patient denies any pain.  Denies any upper respiratory symptoms.  Denies fever.  Denies diarrhea.  Denies any abdominal pain.  Past medical history significant also for malnutrition of moderate degree.  Acute osteomyelitis of the sacrum.  Failure to thrive in adult.  Also hypertension neuropathy and anemia of chronic renal failure.  Patient is on immunosuppressive therapy.  Orting to last infectious disease visit patient is no longer on antibiotics.  She was on amoxicillin but they stopped that.      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   DDD (degenerative disc disease), lumbar    Diabetes mellitus without complication ([place])    type 1   Gait disorder    neurogenic   Hypertension    Hypothyroid 12/08/2020   Neuropathy    Renal disease    Sacral decubitus ulcer, stage IV ([place]) 12/08/2020    Patient Active Problem List   Diagnosis Date Noted   Chronic osteomyelitis of sacrum ([place]) 08/25/2021   Acute lower UTI 02/25/2021   Anemia 02/25/2021   Goals of care, counseling/discussion    Palliative care by specialist    SIRS (systemic inflammatory response syndrome) ([place]) 01/18/2021   Malnutrition of moderate degree 01/07/2021   Acute osteomyelitis of  sacrum (HCC)    Failure to thrive in adult    Unspecified severe protein-calorie malnutrition ([place]) 12/87/8676   Acute metabolic encephalopathy 72/08/4708   Sacral decubitus ulcer, stage IV ([place]) 12/08/2020   Cellulitis 12/08/2020   Hypotension 12/08/2020   Malodorous urine 12/08/2020   Erosive esophagitis 12/08/2020   Hypothyroid 12/08/2020   Depression 12/08/2020   Type 1 diabetes mellitus with hyperlipidemia ([place]) 12/08/2020   Sepsis ([place]) 12/07/2020   Hypoalbuminemia 12/07/2020   Hyponatremia 12/07/2020   CAP (community acquired pneumonia) 12/07/2020   Acute urinary retention 12/07/2020   Coffee ground emesis    Gastroesophageal reflux disease with esophagitis and hemorrhage    Long-term use of immunosuppressant medication    GI bleed 11/08/2020   AKI (acute kidney injury) ([place]) 10/29/2020   Gangrene of right foot ([place])    Subacute osteomyelitis of right foot ([place])    Type 1 diabetes mellitus with complication, with long term current use of insulin pump ([place]) 09/27/2020   Pressure ulcer of BKA stump ([place]) 09/27/2020   Diabetic ketoacidosis ([place]) 09/26/2020   Acute kidney injury superimposed on CKD ([place]) 09/26/2020   Cellulitis and abscess of right leg 09/26/2020   Anemia secondary to renal failure 05/14/2020   S/p cadaver renal transplant 05/14/2020   Chronic kidney disease (CKD) stage G3a/A1, moderately decreased glomerular filtration rate (GFR) between 45-59 mL/min/1.73 square meter and albuminuria creatinine ratio less than 30 mg/g ([place]) 05/14/2020   Hypertension 05/14/2020    Past  Surgical History:  Procedure Laterality Date   AMPUTATION Right 10/01/2020   Procedure: RIGHT BELOW KNEE AMPUTATION;  Surgeon: Newt Minion, MD;  Location: [facility];  Service: Orthopedics;  Laterality: Right;   BLADDER SURGERY     ESOPHAGOGASTRODUODENOSCOPY (EGD) WITH PROPOFOL N/A 11/10/2020   Procedure: ESOPHAGOGASTRODUODENOSCOPY (EGD) WITH PROPOFOL;  Surgeon: Irene Shipper, MD;  Location: [facility]  ENDOSCOPY;  Service: Endoscopy;  Laterality: N/A;   IR GASTROSTOMY TUBE REMOVAL  03/25/2021   KIDNEY TRANSPLANT     LEG SURGERY     tib-fib fx , ORIF     OB History   No obstetric history on file.     Family History  Problem Relation Age of Onset   Hypertension Mother    Hypertension Father     Social History   Tobacco Use   Smoking status: Never   Smokeless tobacco: Never  Vaping Use   Vaping Use: Never used  Substance Use Topics   Alcohol use: Never   Drug use: Never    Home Medications Prior to Admission medications   Medication Sig Start Date End Date Taking? Authorizing Provider  acetaminophen (TYLENOL) 325 MG tablet Take 2 tablets (650 mg total) by mouth every 6 (six) hours as needed for mild pain (or Fever >/= 101). Patient taking differently: Take 650 mg by mouth every 6 (six) hours as needed for mild pain or fever. 10/06/20  Yes Nita Sells, MD  aspirin EC 81 MG tablet Take 81 mg by mouth daily. Swallow whole.   Yes [provider]  atorvastatin (LIPITOR) 10 MG tablet Take 1 tablet (10 mg total) by mouth daily. 03/02/21  Yes Georgette Shell, MD  azaTHIOprine (IMURAN) 50 MG tablet Take 1 tablet (50 mg total) by mouth daily. 03/02/21  Yes Georgette Shell, MD  B Complex-C-Folic Acid TABS Take 1 tablet by mouth daily.   Yes [provider]  cholestyramine (QUESTRAN) 4 g packet Take 1 packet (4 g total) by mouth 2 (two) times daily. 03/02/21  Yes Georgette Shell, MD  collagenase (SANTYL) ointment Apply topically daily. Patient taking differently: Apply 1 application topically daily. 03/02/21  Yes Georgette Shell, MD  cycloSPORINE modified (NEORAL) 25 MG capsule Take 75 mg by mouth 2 (two) times daily. 12/13/21  Yes [provider]  insulin lispro (HUMALOG) 100 UNIT/ML injection Inject 0.02 mLs (2 Units total) into the skin 3 (three) times daily before meals. Sliding scale if 200-250=2 units;251-300=4units;301-350=6  units;351-400=8 units;104-450=10 units;451-500=12 units subcutaneously before meals and at bedtime for DM Patient taking differently: Inject 2-12 Units into the skin 3 (three) times daily before meals. Sliding scale if 200-250=2 units;251-300=4units;301-350=6 units;351-400=8 units;104-450=10 units;451-500=12 units subcutaneously before meals and at bedtime for DM Sliding scale ; via pump 03/02/21  Yes Georgette Shell, MD  ketoconazole (NIZORAL) 2 % cream Apply 1 application topically daily. 11/26/21  Yes [provider]  levothyroxine (SYNTHROID) 88 MCG tablet Take 1 tablet (88 mcg total) by mouth daily before breakfast. 03/02/21  Yes Georgette Shell, MD  lisinopril (ZESTRIL) 5 MG tablet Take 5 mg by mouth daily. 12/19/19  Yes [provider]  liver oil-zinc oxide (DESITIN) 40 % ointment Apply topically as needed for irritation. Patient taking differently: Apply 1 application topically daily as needed for irritation. 03/02/21  Yes Georgette Shell, MD  loperamide (IMODIUM) 2 MG capsule Take 1 capsule (2 mg total) by mouth as needed for diarrhea or loose stools. Patient taking differently: Take 2 mg  by mouth daily as needed for diarrhea or loose stools. 03/02/21  Yes Georgette Shell, MD  ondansetron (ZOFRAN-ODT) 4 MG disintegrating tablet Take 1 tablet (4 mg total) by mouth every 4 (four) hours as needed for nausea or vomiting. 03/02/21  Yes Georgette Shell, MD  oxyCODONE (OXY IR/ROXICODONE) 5 MG immediate release tablet Take 1 tablet (5 mg total) by mouth every 6 (six) hours as needed for severe pain. 03/02/21  Yes Georgette Shell, MD  predniSONE (DELTASONE) 5 MG tablet Take 1 tablet (5 mg total) by mouth daily with breakfast. 03/02/21  Yes Georgette Shell, MD  sertraline (ZOLOFT) 25 MG tablet Take 1 tablet (25 mg total) by mouth daily. 03/02/21  Yes Georgette Shell, MD  sertraline (ZOLOFT) 50 MG tablet Take 50 mg by mouth daily.   Yes [provider]   sodium hypochlorite (DAKIN'S 1/2 STRENGTH) external solution Apply 1 application topically daily. 05/14/21  Yes [provider]  carbamide peroxide (DEBROX) 6.5 % OTIC solution Place 5 drops into the right ear 2 (two) times daily. Patient not taking: Reported on 12/17/2021 03/02/21   Georgette Shell, MD  ciprofloxacin (CIPRO) 500 MG tablet Take 1 tablet (500 mg total) by mouth 2 (two) times daily. Patient not taking: Reported on 11/09/2021 03/02/21   Georgette Shell, MD  cycloSPORINE (SANDIMMUNE) 25 MG capsule Take 3 capsules (75 mg total) by mouth 2 (two) times daily. Patient not taking: Reported on 12/17/2021 03/02/21   Georgette Shell, MD  insulin glargine (LANTUS) 100 UNIT/ML injection Inject 0.05 mLs (5 Units total) into the skin at bedtime. Patient not taking: Reported on 12/17/2021 03/03/21   Hosie Poisson, MD  pantoprazole (PROTONIX) 40 MG tablet Take 1 tablet (40 mg total) by mouth daily. Patient not taking: Reported on 12/17/2021 03/02/21   Georgette Shell, MD  saccharomyces boulardii (FLORASTOR) 250 MG capsule Take 1 capsule (250 mg total) by mouth 2 (two) times daily. Patient not taking: Reported on 12/17/2021 03/02/21   Georgette Shell, MD  triamcinolone (NASACORT) 55 MCG/ACT AERO nasal inhaler USE 1 SPRAY IN [facility] NOSTRIL Patient not taking: Reported on 12/17/2021 07/13/21   Rozetta Nunnery, MD    Allergies    Patient has no known allergies.  Review of Systems   Review of Systems  Constitutional:  Positive for appetite change. Negative for chills and fever.  HENT:  Negative for ear pain and sore throat.   Eyes:  Negative for pain and visual disturbance.  Respiratory:  Negative for cough and shortness of breath.   Cardiovascular:  Negative for chest pain and palpitations.  Gastrointestinal:  Positive for nausea. Negative for abdominal pain, diarrhea and vomiting.  Genitourinary:  Negative for dysuria and hematuria.  Musculoskeletal:  Negative for  arthralgias and back pain.  Skin:  Negative for color change and rash.  Neurological:  Negative for seizures and syncope.  All other systems reviewed and are negative.  Physical Exam Updated Vital Signs BP (!) 141/74    Pulse 92    Temp (!) 97.5 F (36.4 C) (Oral)    Resp 13    Ht 1.6 m (5\' 3" )    Wt 46.7 kg    SpO2 97%    BMI 18.25 kg/m   Physical Exam Vitals and nursing note reviewed.  Constitutional:      General: She is not in acute distress.    Appearance: She is well-developed. She is ill-appearing.     Comments: Patient is  thin.  HENT:     Head: Normocephalic and atraumatic.     Mouth/Throat:     Mouth: Mucous membranes are dry.  Eyes:     Extraocular Movements: Extraocular movements intact.     Conjunctiva/sclera: Conjunctivae normal.     Pupils: Pupils are equal, round, and reactive to light.  Cardiovascular:     Rate and Rhythm: Regular rhythm. Tachycardia present.     Heart sounds: No murmur heard. Pulmonary:     Effort: Pulmonary effort is normal. No respiratory distress.     Breath sounds: Normal breath sounds.  Abdominal:     General: There is no distension.     Palpations: Abdomen is soft.     Tenderness: There is no abdominal tenderness. There is no guarding.  Genitourinary:    Comments: Foley catheter in place. Musculoskeletal:        General: No swelling.     Cervical back: Normal range of motion and neck supple. No rigidity.     Comments: Distal anterior shin with a dressing in place for wound no surrounding erythema.  Below the knee amputation to the right lower extremity.  Longstanding sacral decubitus.  Skin:    General: Skin is warm and dry.     Capillary Refill: Capillary refill takes less than 2 seconds.  Neurological:     General: No focal deficit present.     Mental Status: She is alert and oriented to person, place, and time.  Psychiatric:        Mood and Affect: Mood normal.    ED Results / Procedures / Treatments   Labs (all labs  ordered are listed, but only abnormal results are displayed) Labs Reviewed  COMPREHENSIVE METABOLIC PANEL - Abnormal; Notable for the following components:      Result Value   Glucose, Bld 178 (*)    BUN 56 (*)    Creatinine, Ser 1.43 (*)    Albumin 3.3 (*)    GFR, Estimated 41 (*)    All other components within normal limits  CBC WITH DIFFERENTIAL/PLATELET - Abnormal; Notable for the following components:   RBC 3.16 (*)    Hemoglobin 11.6 (*)    HCT 35.9 (*)    MCV 113.6 (*)    MCH 36.7 (*)    All other components within normal limits  TSH - Abnormal; Notable for the following components:   TSH 13.264 (*)    All other components within normal limits  URINALYSIS, ROUTINE W REFLEX MICROSCOPIC - Abnormal; Notable for the following components:   Specific Gravity, Urine <1.005 (*)    pH 8.5 (*)    Hgb urine dipstick TRACE (*)    Protein, ur 100 (*)    Leukocytes,Ua MODERATE (*)    Bacteria, UA MANY (*)    All other components within normal limits  CBG MONITORING, ED - Abnormal; Notable for the following components:   Glucose-Capillary 152 (*)    All other components within normal limits  CULTURE, BLOOD (ROUTINE X 2)  CULTURE, BLOOD (ROUTINE X 2)  URINE CULTURE  LIPASE, BLOOD  LACTIC ACID, PLASMA    EKG EKG Interpretation  Date/Time:  Thursday December 17 2021 17:28:09 EST Ventricular Rate:  98 PR Interval:  147 QRS Duration: 90 QT Interval:  351 QTC Calculation: 449 R Axis:   192 Text Interpretation: Sinus rhythm Inferior infarct, old Anterior infarct, old No significant change since last tracing Confirmed by Fredia Sorrow [phone]) on 12/17/2021 5:31:42 PM  Radiology CT Abdomen  Pelvis W Contrast  Result Date: 12/17/2021 CLINICAL DATA:  Nausea/vomiting EXAM: CT ABDOMEN AND PELVIS WITH CONTRAST TECHNIQUE: Multidetector CT imaging of the abdomen and pelvis was performed using the standard protocol following bolus administration of intravenous contrast. CONTRAST:  74mL  OMNIPAQUE IOHEXOL 350 MG/ML SOLN COMPARISON:  Ct abd/pelvis 02/25/21 FINDINGS: Lower chest: No acute abnormality. Marked diffuse circumferential thickening of the visualized distal esophagus with associated hyperenhancement of the mucosa. Hepatobiliary: No focal liver abnormality. Calcified gallstone noted within the gallbladder lumen. No gallbladder wall thickening or pericholecystic fluid. No biliary dilatation. Pancreas: No focal lesion. Normal pancreatic contour. No surrounding inflammatory changes. No main pancreatic ductal dilatation. Spleen: Normal in size without focal abnormality. Adrenals/Urinary Tract: No adrenal nodule bilaterally. Marked atrophy and scarring of bilateral native kidneys. Left iliac fossa renal transplant. Associated subcentimeter hypodensities are too small to characterize. No hydroureteronephrosis. Associated urothelial thickening of the transplanted ureter. The transplant kidney enhances homogeneously. No hydronephrosis. No hydroureter. The urinary bladder lumen is mildly distended with urine. Foci of gas noted within the urinary bladder lumen. Temperature probe noted terminating within the urinary bladder lumen. Stomach/Bowel: Stomach is within normal limits. No evidence of bowel wall thickening or dilatation. Stool throughout the colon. Appendix appears normal. 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 No abdominal aorta aneurysm. Mild atherosclerotic plaque of the aorta and its branches. No abdominal, pelvic, or inguinal lymphadenopathy. Reproductive: Poorly visualized pelvis due to streak artifact originating from the right femoral surgical hardware. Other: No intraperitoneal free fluid. No intraperitoneal free gas. No organized fluid collection. Musculoskeletal: Redemonstration of a deep sacral decubitus ulcer. No abscess formation. No cortical erosion or destruction. No suspicious lytic or blastic osseous lesions. No acute displaced fracture.  Multilevel degenerative changes of the spine. IMPRESSION: 1. Urothelial thickening of the transplanted ureter. Findings concerning for infection. Foci of gas within the urinary bladder lumen likely related to temperature probe instrumentation. Differential diagnosis includes cystitis. Correlate with urinalysis. 2. Marked diffuse circumferential thickening of the visualized distal esophagus with associated hyperenhancement of the mucosa. Correlate with signs and symptoms of esophagitis. Underlying malignancy not excluded. 3. Cholelithiasis with no CT findings of acute cholecystitis. 4. Deep sacral decubitus ulcer. No abscess formation. 5.  Aortic Atherosclerosis (ICD10-I70.0). 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 Electronically Signed   By: Iven Finn M.D.   On: 12/17/2021 19:22   DG Chest Port 1 View  Result Date: 12/17/2021 CLINICAL DATA:  Nausea the started last night and all day today. EXAM: PORTABLE CHEST 1 VIEW COMPARISON:  02/25/2021 FINDINGS: Mild bibasilar atelectasis. No focal consolidation. No pleural effusion or pneumothorax. Heart and mediastinal contours are unremarkable. No acute osseous abnormality. Old right anterior rib fractures. Osteoarthritis of bilateral glenohumeral joints. IMPRESSION: No active disease. Electronically Signed   By: Kathreen Devoid M.D.   On: 12/17/2021 17:10    Procedures Procedures   Medications Ordered in ED Medications  0.9 %  sodium chloride infusion ( Intravenous New Bag/Given 12/17/21 1804)  cefTRIAXone (ROCEPHIN) 1 g in sodium chloride 0.9 % 100 mL IVPB (has no administration in time range)  sodium chloride 0.9 % bolus 500 mL (500 mLs Intravenous Bolus 12/17/21 1715)  ondansetron (ZOFRAN) injection 4 mg (4 mg Intravenous Given 12/17/21 1715)  HYDROmorphone (DILAUDID) injection 1 mg (1 mg Intravenous Given 12/17/21 1852)  iohexol (OMNIPAQUE) 350 MG/ML injection 75 mL (75 mLs Intravenous Contrast Given 12/17/21 1839)    ED Course   I have reviewed the triage  vital signs and the nursing notes.  Pertinent labs & imaging results that were available during my care of the patient were reviewed by me and considered in my medical decision making (see chart for details).    MDM Rules/Calculators/A&P                         Discussed with nephrology Dr. Justin Mend.  Patient is followed by [facility] nephrology in [place].  Patient here with little bit worsening kidney function than baseline.  Urinalysis highly suggestive of urinary tract infection even though she is got a long indwelling Foley catheter.  Culture sent patient started on Rocephin which was okay with nephrology.  Just recently patient vomited some coffee-ground type emesis.  Will start on Protonix.  Will contact hospitalist for admission since nephrology recommended the IV antibiotics as well as gentle hydration.  Also patient has a history of hypothyroidism.  Her TSH is very high so she may not be adequately treated.   Final Clinical Impression(s) / ED Diagnoses Final diagnoses:  Acute cystitis without hematuria  AKI (acute kidney injury) ([place])  History of renal transplant  Hypothyroidism, unspecified type    Rx / DC Orders ED Discharge Orders     None        Fredia Sorrow, MD 12/17/21 2116

## 2021-12-17 NOTE — H&P (Addendum)
History and Physical    [name] PZW:[id number] DOB: [birth year] DOA: 12/17/2021  PCP: [name], MD   Patient coming from: Home  I have personally briefly reviewed patient's old medical records in [place]  CC: nausea HPI: 60 year old female with a history of type 1 diabetes, renal transplant, CKD stage III, history of esophagitis, history of a chronic stage IV decubitus ulcer, right below-knee amputation, chronic Foley catheter presents to the ER today with a 2-day history of nausea, intermittent dry heaving.  Patient denies any fevers.  No upper respiratory symptoms.  Patient has a greater than 40-year history of diabetes type 1.  She has been on insulin for many years.  She has an insulin pump.  He had a DKA of her right lower extremity about a year ago by Dr. Sharol Given.  She states that she is going to be going to the prosthesis clinic in January 2023 refitted for a right lower extremity prosthesis.  SHe states that she developed started feeling nauseated.  sHe attempted to vomit several times but is only had dry heaves.  Denies any fevers.  Patient has a chronic Foley catheter that is changed out on a monthly basis.  Patient is due for Foley catheter change in 2 to 3 days.  Current catheter has been in place for more than 3-1/2-week.  Patient presented to the ER today for evaluation.  On arrival, patient is afebrile temp 97.5 heart rate 110 blood pressure 96/68 satting high percent on room air.  Laboratory evaluation CBC white count 9.4, hemoglobin 11.6, platelets 222  Sodium 138, BUN of 56, creatinine 1.43.  Baseline creatinine approximately 1.09-1.2  TSH was elevated at 13.2  Unfortunately a urinalysis was obtained from her catheter that been in place for the last 3 weeks.  Repeat UA from a newly changed out catheter still pending.  Due to elevated serum creatinine, [facility] hospitalist contacted for admission.  Nephrology has been notified the patient  admission.   ED Course: UA obtained from chronic Foley in place for 3 weeks.  Chemistry showed mild AKI.  CT abdomen.  Circumferential thickening of the distal esophagus concerning for esophagitis.  Urothelial thickening of transplanted of the transplanted ureter.  Review of Systems:  Review of Systems  Constitutional:  Negative for chills, fever and malaise/fatigue.  HENT: Negative.    Eyes: Negative.   Respiratory: Negative.    Cardiovascular: Negative.   Gastrointestinal:  Positive for nausea and vomiting. Negative for abdominal pain.  Genitourinary: Negative.   Musculoskeletal: Negative.   Skin:        Chronic sacral decubitus ulcer  Neurological: Negative.   Endo/Heme/Allergies: Negative.   Psychiatric/Behavioral: Negative.    All other systems reviewed and are negative.  Past Medical History:  Diagnosis Date   Anemia of chronic renal failure    Cellulitis and abscess of right leg 09/26/2020   Chronic osteomyelitis of sacrum ([place]) 08/25/2021   DDD (degenerative disc disease), lumbar    Diabetes mellitus without complication ([place])    type 1   Diabetic ketoacidosis ([place]) 09/26/2020   Gait disorder    neurogenic   Gangrene of right foot ([place])    Hypertension    Hypothyroid 12/08/2020   Neuropathy    Renal disease    Sacral decubitus ulcer, stage IV ([place]) 12/08/2020   Subacute osteomyelitis of right foot [facility])     Past Surgical History:  Procedure Laterality Date   AMPUTATION Right 10/01/2020   Procedure: RIGHT BELOW KNEE AMPUTATION;  Surgeon: Newt Minion, MD;  Location: [facility];  Service: Orthopedics;  Laterality: Right;   BLADDER SURGERY     ESOPHAGOGASTRODUODENOSCOPY (EGD) WITH PROPOFOL N/A 11/10/2020   Procedure: ESOPHAGOGASTRODUODENOSCOPY (EGD) WITH PROPOFOL;  Surgeon: Irene Shipper, MD;  Location: [facility];  Service: Endoscopy;  Laterality: N/A;   IR GASTROSTOMY TUBE REMOVAL  03/25/2021   KIDNEY TRANSPLANT     LEG SURGERY     tib-fib fx , ORIF      reports that she has never smoked. She has never used smokeless tobacco. She reports that she does not drink alcohol and does not use drugs.  No Known Allergies  Family History  Problem Relation Age of Onset   Hypertension Mother    Hypertension Father     Prior to Admission medications   Medication Sig Start Date End Date Taking? Authorizing Provider  acetaminophen (TYLENOL) 325 MG tablet Take 2 tablets (650 mg total) by mouth every 6 (six) hours as needed for mild pain (or Fever >/= 101). Patient taking differently: Take 650 mg by mouth every 6 (six) hours as needed for mild pain or fever. 10/06/20  Yes Nita Sells, MD  aspirin EC 81 MG tablet Take 81 mg by mouth daily. Swallow whole.   Yes [provider]  atorvastatin (LIPITOR) 10 MG tablet Take 1 tablet (10 mg total) by mouth daily. 03/02/21  Yes Georgette Shell, MD  azaTHIOprine (IMURAN) 50 MG tablet Take 1 tablet (50 mg total) by mouth daily. 03/02/21  Yes Georgette Shell, MD  B Complex-C-Folic Acid TABS Take 1 tablet by mouth daily.   Yes [provider]  cholestyramine (QUESTRAN) 4 g packet Take 1 packet (4 g total) by mouth 2 (two) times daily. 03/02/21  Yes Georgette Shell, MD  collagenase (SANTYL) ointment Apply topically daily. Patient taking differently: Apply 1 application topically daily. 03/02/21  Yes Georgette Shell, MD  cycloSPORINE modified (NEORAL) 25 MG capsule Take 75 mg by mouth 2 (two) times daily. 12/13/21  Yes [provider]  insulin lispro (HUMALOG) 100 UNIT/ML injection Inject 0.02 mLs (2 Units total) into the skin 3 (three) times daily before meals. Sliding scale if 200-250=2 units;251-300=4units;301-350=6 units;351-400=8 units;104-450=10 units;451-500=12 units subcutaneously before meals and at bedtime for DM Patient taking differently: Inject 2-12 Units into the skin 3 (three) times daily before meals. Sliding scale if 200-250=2 units;251-300=4units;301-350=6  units;351-400=8 units;104-450=10 units;451-500=12 units subcutaneously before meals and at bedtime for DM Sliding scale ; via pump 03/02/21  Yes Georgette Shell, MD  ketoconazole (NIZORAL) 2 % cream Apply 1 application topically daily. 11/26/21  Yes [provider]  levothyroxine (SYNTHROID) 88 MCG tablet Take 1 tablet (88 mcg total) by mouth daily before breakfast. 03/02/21  Yes Georgette Shell, MD  lisinopril (ZESTRIL) 5 MG tablet Take 5 mg by mouth daily. 12/19/19  Yes [provider]  liver oil-zinc oxide (DESITIN) 40 % ointment Apply topically as needed for irritation. Patient taking differently: Apply 1 application topically daily as needed for irritation. 03/02/21  Yes Georgette Shell, MD  loperamide (IMODIUM) 2 MG capsule Take 1 capsule (2 mg total) by mouth as needed for diarrhea or loose stools. Patient taking differently: Take 2 mg by mouth daily as needed for diarrhea or loose stools. 03/02/21  Yes Georgette Shell, MD  ondansetron (ZOFRAN-ODT) 4 MG disintegrating tablet Take 1 tablet (4 mg total) by mouth every 4 (four) hours as needed for nausea or vomiting. 03/02/21  Yes Rodena Piety,  Noland Fordyce, MD  oxyCODONE (OXY IR/ROXICODONE) 5 MG immediate release tablet Take 1 tablet (5 mg total) by mouth every 6 (six) hours as needed for severe pain. 03/02/21  Yes Georgette Shell, MD  predniSONE (DELTASONE) 5 MG tablet Take 1 tablet (5 mg total) by mouth daily with breakfast. 03/02/21  Yes Georgette Shell, MD  sertraline (ZOLOFT) 25 MG tablet Take 1 tablet (25 mg total) by mouth daily. 03/02/21  Yes Georgette Shell, MD  sertraline (ZOLOFT) 50 MG tablet Take 50 mg by mouth daily.   Yes [provider]  sodium hypochlorite (DAKIN'S 1/2 STRENGTH) external solution Apply 1 application topically daily. 05/14/21  Yes [provider]  carbamide peroxide (DEBROX) 6.5 % OTIC solution Place 5 drops into the right ear 2 (two) times daily. Patient not taking:  Reported on 12/17/2021 03/02/21   Georgette Shell, MD  ciprofloxacin (CIPRO) 500 MG tablet Take 1 tablet (500 mg total) by mouth 2 (two) times daily. Patient not taking: Reported on 11/09/2021 03/02/21   Georgette Shell, MD  cycloSPORINE (SANDIMMUNE) 25 MG capsule Take 3 capsules (75 mg total) by mouth 2 (two) times daily. Patient not taking: Reported on 12/17/2021 03/02/21   Georgette Shell, MD  insulin glargine (LANTUS) 100 UNIT/ML injection Inject 0.05 mLs (5 Units total) into the skin at bedtime. Patient not taking: Reported on 12/17/2021 03/03/21   Hosie Poisson, MD  pantoprazole (PROTONIX) 40 MG tablet Take 1 tablet (40 mg total) by mouth daily. Patient not taking: Reported on 12/17/2021 03/02/21   Georgette Shell, MD  saccharomyces boulardii (FLORASTOR) 250 MG capsule Take 1 capsule (250 mg total) by mouth 2 (two) times daily. Patient not taking: Reported on 12/17/2021 03/02/21   Georgette Shell, MD  triamcinolone (NASACORT) 55 MCG/ACT AERO nasal inhaler USE 1 SPRAY IN [facility] NOSTRIL Patient not taking: Reported on 12/17/2021 07/13/21   Rozetta Nunnery, MD    Physical Exam: Vitals:   12/17/21 2000 12/17/21 2030 12/17/21 2100 12/17/21 2130  BP: 126/61 (!) 149/87 (!) 141/74 (!) 145/86  Pulse: 84 94 92 86  Resp: (!) 9 17 13 16   Temp:      TempSrc:      SpO2: 97% 99% 97% 98%  Weight:      Height:        Physical Exam Vitals and nursing note reviewed.  Constitutional:      Comments: Appears chronically ill Older appearing than stated age of 60  HENT:     Head: Normocephalic and atraumatic.     Nose: No congestion or rhinorrhea.  Eyes:     Comments: Left cornea is cloudy  Cardiovascular:     Rate and Rhythm: Normal rate and regular rhythm.     Pulses: Normal pulses.  Pulmonary:     Effort: Pulmonary effort is normal. No respiratory distress.     Breath sounds: Normal breath sounds. No wheezing or rales.  Abdominal:     General: Bowel sounds are normal.  There is no distension.     Tenderness: There is no abdominal tenderness. There is no guarding or rebound.  Musculoskeletal:     Comments: Right BKA  Skin:    Capillary Refill: Capillary refill takes less than 2 seconds.     Findings: Lesion present.     Comments: Stage IV decubitus ulcer.  Present on admission.  See picture.  Neurological:     Mental Status: She is alert and oriented to person, place, and  time.      Labs on Admission: I have personally reviewed following labs and imaging studies  CBC: Recent Labs  Lab 12/17/21 1719  WBC 9.4  NEUTROABS 7.7  HGB 11.6*  HCT 35.9*  MCV 113.6*  PLT 599   Basic Metabolic Panel: Recent Labs  Lab 12/17/21 1719  NA 138  K 4.6  CL 105  CO2 24  GLUCOSE 178*  BUN 56*  CREATININE 1.43*  CALCIUM 9.9   GFR: Estimated Creatinine Clearance: 30.1 mL/min (A) (by C-G formula based on SCr of 1.43 mg/dL (H)). Liver Function Tests: Recent Labs  Lab 12/17/21 1719  AST 18  ALT 10  ALKPHOS 94  BILITOT 1.0  PROT 6.8  ALBUMIN 3.3*   Recent Labs  Lab 12/17/21 1719  LIPASE 32   No results for input(s): AMMONIA in the last 168 hours. Coagulation Profile: No results for input(s): INR, PROTIME in the last 168 hours. Cardiac Enzymes: No results for input(s): CKTOTAL, CKMB, CKMBINDEX, TROPONINI in the last 168 hours. BNP (last 3 results) No results for input(s): PROBNP in the last 8760 hours. HbA1C: No results for input(s): HGBA1C in the last 72 hours. CBG: Recent Labs  Lab 12/17/21 1626  GLUCAP 152*   Lipid Profile: No results for input(s): CHOL, HDL, LDLCALC, TRIG, CHOLHDL, LDLDIRECT in the last 72 hours. Thyroid Function Tests: Recent Labs    12/17/21 1719  TSH 13.264*   Anemia Panel: No results for input(s): VITAMINB12, FOLATE, FERRITIN, TIBC, IRON, RETICCTPCT in the last 72 hours. Urine analysis:    Component Value Date/Time   COLORURINE YELLOW 12/17/2021 [place] 12/17/2021 1804   LABSPEC  <1.005 (L) 12/17/2021 1804   PHURINE 8.5 (H) 12/17/2021 1804   GLUCOSEU NEGATIVE 12/17/2021 1804   HGBUR TRACE (A) 12/17/2021 1804   BILIRUBINUR NEGATIVE 12/17/2021 1804   KETONESUR NEGATIVE 12/17/2021 1804   PROTEINUR 100 (A) 12/17/2021 1804   NITRITE NEGATIVE 12/17/2021 1804   LEUKOCYTESUR MODERATE (A) 12/17/2021 1804    Radiological Exams on Admission: I have personally reviewed images CT Abdomen Pelvis W Contrast  Result Date: 12/17/2021 CLINICAL DATA:  Nausea/vomiting EXAM: CT ABDOMEN AND PELVIS WITH CONTRAST TECHNIQUE: Multidetector CT imaging of the abdomen and pelvis was performed using the standard protocol following bolus administration of intravenous contrast. CONTRAST:  65mL OMNIPAQUE IOHEXOL 350 MG/ML SOLN COMPARISON:  Ct abd/pelvis 02/25/21 FINDINGS: Lower chest: No acute abnormality. Marked diffuse circumferential thickening of the visualized distal esophagus with associated hyperenhancement of the mucosa. Hepatobiliary: No focal liver abnormality. Calcified gallstone noted within the gallbladder lumen. No gallbladder wall thickening or pericholecystic fluid. No biliary dilatation. Pancreas: No focal lesion. Normal pancreatic contour. No surrounding inflammatory changes. No main pancreatic ductal dilatation. Spleen: Normal in size without focal abnormality. Adrenals/Urinary Tract: No adrenal nodule bilaterally. Marked atrophy and scarring of bilateral native kidneys. Left iliac fossa renal transplant. Associated subcentimeter hypodensities are too small to characterize. No hydroureteronephrosis. Associated urothelial thickening of the transplanted ureter. The transplant kidney enhances homogeneously. No hydronephrosis. No hydroureter. The urinary bladder lumen is mildly distended with urine. Foci of gas noted within the urinary bladder lumen. Temperature probe noted terminating within the urinary bladder lumen. Stomach/Bowel: Stomach is within normal limits. No evidence of bowel wall  thickening or dilatation. Stool throughout the colon. Appendix appears normal. 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 No abdominal aorta aneurysm. Mild atherosclerotic plaque of the aorta and its branches. No abdominal, pelvic, or inguinal lymphadenopathy. Reproductive: Poorly  visualized pelvis due to streak artifact originating from the right femoral surgical hardware. Other: No intraperitoneal free fluid. No intraperitoneal free gas. No organized fluid collection. Musculoskeletal: Redemonstration of a deep sacral decubitus ulcer. No abscess formation. No cortical erosion or destruction. No suspicious lytic or blastic osseous lesions. No acute displaced fracture. Multilevel degenerative changes of the spine. IMPRESSION: 1. Urothelial thickening of the transplanted ureter. Findings concerning for infection. Foci of gas within the urinary bladder lumen likely related to temperature probe instrumentation. Differential diagnosis includes cystitis. Correlate with urinalysis. 2. Marked diffuse circumferential thickening of the visualized distal esophagus with associated hyperenhancement of the mucosa. Correlate with signs and symptoms of esophagitis. Underlying malignancy not excluded. 3. Cholelithiasis with no CT findings of acute cholecystitis. 4. Deep sacral decubitus ulcer. No abscess formation. 5.  Aortic Atherosclerosis (ICD10-I70.0). 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 Electronically Signed   By: Iven Finn M.D.   On: 12/17/2021 19:22   DG Chest Port 1 View  Result Date: 12/17/2021 CLINICAL DATA:  Nausea the started last night and all day today. EXAM: PORTABLE CHEST 1 VIEW COMPARISON:  02/25/2021 FINDINGS: Mild bibasilar atelectasis. No focal consolidation. No pleural effusion or pneumothorax. Heart and mediastinal contours are unremarkable. No acute osseous abnormality. Old right anterior rib fractures. Osteoarthritis of bilateral  glenohumeral joints. IMPRESSION: No active disease. Electronically Signed   By: Kathreen Devoid M.D.   On: 12/17/2021 17:10    EKG: I have personally reviewed EKG: NSR    Assessment/Plan Principal Problem:   Acute kidney injury superimposed on CKD (HCC) Active Problems:   Coffee ground emesis   S/p cadaver renal transplant   Chronic kidney disease (CKD) stage G3a/A1, moderately decreased glomerular filtration rate (GFR) between 45-59 mL/min/1.73 square meter and albuminuria creatinine ratio less than 30 mg/g (HCC)   Type 1 diabetes mellitus with complication, with long term current use of insulin pump (HCC)   Long-term use of immunosuppressant medication   Sacral decubitus ulcer, stage IV (HCC)   Anemia   Chronic osteomyelitis of sacrum (HCC)   Chronic indwelling Foley catheter   S/P BKA (below knee amputation) unilateral, right ([place])   DNR (do not resuscitate)/DNI(Do Not Intubate)    Acute kidney injury superimposed on CKD ([place]) Assign to Observation status.  Continue IV fluids. Repeat BMP in AM.  Patient has urothelial thickening of the transplanted ureter.  Possibly infection versus rejection.  Patient was given 1 dose of IV Rocephin in the ER based on a urine analysis on the chronic Foley.  We will hold on further antibiotic therapy until nephrology can see the patient and determine if the patient needs any further antibiotic.  Coffee ground emesis Patient with coffee-ground emesis on her close.  Patient has a history of esophagitis.  Has circumferential thickening of the distal esophagus seen on CT.  Start IV Protonix..  Repeat CBC in the morning.  May need a GI consult in the morning.  Keep n.p.o. in case she needs an EGD.  S/p cadaver renal transplant Chronic.  Continue Imuran, prednisone, cyclosporine.  Nephrology notified of patient's admission.  Patient sees Dr. Posey Pronto with [facility].  Chronic kidney disease (CKD) stage G3a/A1, moderately decreased glomerular filtration  rate (GFR) between 45-59 mL/min/1.73 square meter and albuminuria creatinine ratio less than 30 mg/g (HCC) Chronic.  Baseline creatinine approximately 1-1.2  Type 1 diabetes mellitus with complication, with long term current use of insulin pump ([place]) Patient has an insulin pump.  Long-term use of  immunosuppressant medication Continue with Imuran, prednisone, cyclosporine.  Sacral decubitus ulcer, stage IV ([place]) Present on admission.  Patient follow-up with infectious disease.  We will have wound care see the patient as well.     Anemia Chronic.  Chronic osteomyelitis of sacrum ([place]) Follow-up with infectious disease.  Chronic.  Chronic indwelling Foley catheter Unfortunately, patient's UA, culture and subsequent IV antibiotics were performed on urine that was obtained from her chronic Foley catheter had been in place for the last 3 and half weeks.  Foley catheter will be changed out tonight in the ER and a new UA will be sent.  Patient not having any symptoms of a urinary tract infection.  We will hold on any further antibiotics for now.  Patient states that she has a chronic Foley due to her inability to get to the bathroom because of her right below the knee amputation.  S/P BKA (below knee amputation) unilateral, right [facility]) Patient states that she will be going to the [place] clinic in January 2023 to be fitted for a prosthesis.  DNR (do not resuscitate)/DNI(Do Not Intubate) Verified DNR and DNI status with the patient at bedside.  Patient states that her husband is aware of her wishes.  DVT prophylaxis: SCDs Code Status: DNR/DNI(Do NOT Intubate) verified with pt at bedside Family Communication: no family at bedside  Disposition Plan: return home  Consults called: nephrology. Dr. Justin Mend  Admission status: Observation, Med-Surg   Kristopher Oppenheim, DO [facility] 12/17/2021, 10:15 PM

## 2021-12-17 NOTE — Subjective & Objective (Signed)
CC: nausea HPI: 60 year old female with a history of type 1 diabetes, renal transplant, CKD stage III, history of esophagitis, history of a chronic stage IV decubitus ulcer, right below-knee amputation, chronic Foley catheter presents to the ER today with a 2-day history of nausea, intermittent dry heaving.  Patient denies any fevers.  No upper respiratory symptoms.  Patient has a greater than 40-year history of diabetes type 1.  She has been on insulin for many years.  She has an insulin pump.  He had a DKA of her right lower extremity about a year ago by Dr. Sharol Given.  She states that she is going to be going to the prosthesis clinic in January 2023 refitted for a right lower extremity prosthesis.  SHe states that she developed started feeling nauseated.  sHe attempted to vomit several times but is only had dry heaves.  Denies any fevers.  Patient has a chronic Foley catheter that is changed out on a monthly basis.  Patient is due for Foley catheter change in 2 to 3 days.  Current catheter has been in place for more than 3-1/2-week.  Patient presented to the ER today for evaluation.  On arrival, patient is afebrile temp 97.5 heart rate 110 blood pressure 96/68 satting high percent on room air.  Laboratory evaluation CBC white count 9.4, hemoglobin 11.6, platelets 222  Sodium 138, BUN of 56, creatinine 1.43.  Baseline creatinine approximately 1.09-1.2  TSH was elevated at 13.2  Unfortunately a urinalysis was obtained from her catheter that been in place for the last 3 weeks.  Repeat UA from a newly changed out catheter still pending.  Due to elevated serum creatinine, [facility] hospitalist contacted for admission.  Nephrology has been notified the patient admission.

## 2021-12-17 NOTE — Assessment & Plan Note (Signed)
Verified DNR and DNI status with the patient at bedside.  Patient states that her husband is aware of her wishes.

## 2021-12-17 NOTE — ED Triage Notes (Signed)
Patient c/o nausea onset of last night and all today. Patient has chronic foley and states when sneezing it will leak around it. Patients husband states patient has been having intermittent nausea over the past month and has not been able to keep much down. Pt wears insulin pump.

## 2021-12-18 DIAGNOSIS — N10 Acute tubulo-interstitial nephritis: Secondary | ICD-10-CM | POA: Diagnosis present

## 2021-12-18 DIAGNOSIS — N3000 Acute cystitis without hematuria: Secondary | ICD-10-CM | POA: Diagnosis present

## 2021-12-18 DIAGNOSIS — M8668 Other chronic osteomyelitis, other site: Secondary | ICD-10-CM | POA: Diagnosis not present

## 2021-12-18 DIAGNOSIS — A419 Sepsis, unspecified organism: Secondary | ICD-10-CM | POA: Diagnosis present

## 2021-12-18 DIAGNOSIS — Z978 Presence of other specified devices: Secondary | ICD-10-CM | POA: Diagnosis not present

## 2021-12-18 DIAGNOSIS — D649 Anemia, unspecified: Secondary | ICD-10-CM | POA: Diagnosis not present

## 2021-12-18 DIAGNOSIS — N179 Acute kidney failure, unspecified: Secondary | ICD-10-CM | POA: Diagnosis present

## 2021-12-18 DIAGNOSIS — Y830 Surgical operation with transplant of whole organ as the cause of abnormal reaction of the patient, or of later complication, without mention of misadventure at the time of the procedure: Secondary | ICD-10-CM | POA: Diagnosis present

## 2021-12-18 DIAGNOSIS — N1831 Chronic kidney disease, stage 3a: Secondary | ICD-10-CM | POA: Diagnosis not present

## 2021-12-18 DIAGNOSIS — D631 Anemia in chronic kidney disease: Secondary | ICD-10-CM | POA: Diagnosis present

## 2021-12-18 DIAGNOSIS — I129 Hypertensive chronic kidney disease with stage 1 through stage 4 chronic kidney disease, or unspecified chronic kidney disease: Secondary | ICD-10-CM | POA: Diagnosis present

## 2021-12-18 DIAGNOSIS — E872 Acidosis, unspecified: Secondary | ICD-10-CM | POA: Diagnosis present

## 2021-12-18 DIAGNOSIS — E1022 Type 1 diabetes mellitus with diabetic chronic kidney disease: Secondary | ICD-10-CM | POA: Diagnosis present

## 2021-12-18 DIAGNOSIS — Z794 Long term (current) use of insulin: Secondary | ICD-10-CM | POA: Diagnosis not present

## 2021-12-18 DIAGNOSIS — T8613 Kidney transplant infection: Secondary | ICD-10-CM | POA: Diagnosis present

## 2021-12-18 DIAGNOSIS — Z66 Do not resuscitate: Secondary | ICD-10-CM | POA: Diagnosis present

## 2021-12-18 DIAGNOSIS — T83511A Infection and inflammatory reaction due to indwelling urethral catheter, initial encounter: Secondary | ICD-10-CM | POA: Diagnosis present

## 2021-12-18 DIAGNOSIS — Z89511 Acquired absence of right leg below knee: Secondary | ICD-10-CM | POA: Diagnosis not present

## 2021-12-18 DIAGNOSIS — T8619 Other complication of kidney transplant: Secondary | ICD-10-CM | POA: Diagnosis present

## 2021-12-18 DIAGNOSIS — E440 Moderate protein-calorie malnutrition: Secondary | ICD-10-CM | POA: Diagnosis present

## 2021-12-18 DIAGNOSIS — Z20822 Contact with and (suspected) exposure to covid-19: Secondary | ICD-10-CM | POA: Diagnosis present

## 2021-12-18 DIAGNOSIS — D84821 Immunodeficiency due to drugs: Secondary | ICD-10-CM | POA: Diagnosis present

## 2021-12-18 DIAGNOSIS — Z9641 Presence of insulin pump (external) (internal): Secondary | ICD-10-CM | POA: Diagnosis present

## 2021-12-18 DIAGNOSIS — Z1611 Resistance to penicillins: Secondary | ICD-10-CM | POA: Diagnosis present

## 2021-12-18 DIAGNOSIS — Z681 Body mass index (BMI) 19 or less, adult: Secondary | ICD-10-CM | POA: Diagnosis not present

## 2021-12-18 DIAGNOSIS — L89154 Pressure ulcer of sacral region, stage 4: Secondary | ICD-10-CM | POA: Diagnosis present

## 2021-12-18 DIAGNOSIS — Y846 Urinary catheterization as the cause of abnormal reaction of the patient, or of later complication, without mention of misadventure at the time of the procedure: Secondary | ICD-10-CM | POA: Diagnosis present

## 2021-12-18 DIAGNOSIS — E039 Hypothyroidism, unspecified: Secondary | ICD-10-CM | POA: Diagnosis present

## 2021-12-18 DIAGNOSIS — R6520 Severe sepsis without septic shock: Secondary | ICD-10-CM | POA: Diagnosis present

## 2021-12-18 DIAGNOSIS — M4628 Osteomyelitis of vertebra, sacral and sacrococcygeal region: Secondary | ICD-10-CM | POA: Diagnosis present

## 2021-12-18 LAB — GLUCOSE, CAPILLARY
Glucose-Capillary: 191 mg/dL — ABNORMAL HIGH (ref 70–99)
Glucose-Capillary: 193 mg/dL — ABNORMAL HIGH (ref 70–99)
Glucose-Capillary: 207 mg/dL — ABNORMAL HIGH (ref 70–99)
Glucose-Capillary: 245 mg/dL — ABNORMAL HIGH (ref 70–99)
Glucose-Capillary: 304 mg/dL — ABNORMAL HIGH (ref 70–99)
Glucose-Capillary: 314 mg/dL — ABNORMAL HIGH (ref 70–99)
Glucose-Capillary: 315 mg/dL — ABNORMAL HIGH (ref 70–99)

## 2021-12-18 LAB — CBC WITH DIFFERENTIAL/PLATELET
Abs Immature Granulocytes: 0.05 10*3/uL (ref 0.00–0.07)
Basophils Absolute: 0 10*3/uL (ref 0.0–0.1)
Basophils Relative: 0 %
Eosinophils Absolute: 0 10*3/uL (ref 0.0–0.5)
Eosinophils Relative: 0 %
HCT: 30.1 % — ABNORMAL LOW (ref 36.0–46.0)
Hemoglobin: 9.6 g/dL — ABNORMAL LOW (ref 12.0–15.0)
Immature Granulocytes: 1 %
Lymphocytes Relative: 16 %
Lymphs Abs: 1.4 10*3/uL (ref 0.7–4.0)
MCH: 36.6 pg — ABNORMAL HIGH (ref 26.0–34.0)
MCHC: 31.9 g/dL (ref 30.0–36.0)
MCV: 114.9 fL — ABNORMAL HIGH (ref 80.0–100.0)
Monocytes Absolute: 0.5 10*3/uL (ref 0.1–1.0)
Monocytes Relative: 5 %
Neutro Abs: 6.8 10*3/uL (ref 1.7–7.7)
Neutrophils Relative %: 78 %
Platelets: 196 10*3/uL (ref 150–400)
RBC: 2.62 MIL/uL — ABNORMAL LOW (ref 3.87–5.11)
RDW: 15.1 % (ref 11.5–15.5)
WBC: 8.8 10*3/uL (ref 4.0–10.5)
nRBC: 0 % (ref 0.0–0.2)

## 2021-12-18 LAB — URINALYSIS, ROUTINE W REFLEX MICROSCOPIC
Bacteria, UA: NONE SEEN
Bilirubin Urine: NEGATIVE
Glucose, UA: NEGATIVE mg/dL
Nitrite: POSITIVE — AB
Protein, ur: 100 mg/dL — AB
Specific Gravity, Urine: 1.015 (ref 1.005–1.030)
WBC, UA: >50 WBC/hpf — ABNORMAL HIGH (ref 0–5)
pH: 7 (ref 5.0–8.0)

## 2021-12-18 LAB — COMPREHENSIVE METABOLIC PANEL
ALT: 11 U/L (ref 0–44)
AST: 15 U/L (ref 15–41)
Albumin: 2.7 g/dL — ABNORMAL LOW (ref 3.5–5.0)
Alkaline Phosphatase: 80 U/L (ref 38–126)
Anion gap: 12 (ref 5–15)
BUN: 58 mg/dL — ABNORMAL HIGH (ref 8–23)
CO2: 17 mmol/L — ABNORMAL LOW (ref 22–32)
Calcium: 9.1 mg/dL (ref 8.9–10.3)
Chloride: 109 mmol/L (ref 98–111)
Creatinine, Ser: 1.49 mg/dL — ABNORMAL HIGH (ref 0.44–1.00)
GFR, Estimated: 39 mL/min — ABNORMAL LOW (ref >60)
Glucose, Bld: 321 mg/dL — ABNORMAL HIGH (ref 70–99)
Potassium: 5.3 mmol/L — ABNORMAL HIGH (ref 3.5–5.1)
Sodium: 138 mmol/L (ref 135–145)
Total Bilirubin: 1.4 mg/dL — ABNORMAL HIGH (ref 0.3–1.2)
Total Protein: 5.9 g/dL — ABNORMAL LOW (ref 6.5–8.1)

## 2021-12-18 LAB — HEMOGLOBIN A1C
Hgb A1c MFr Bld: 6.3 % — ABNORMAL HIGH (ref 4.8–5.6)
Mean Plasma Glucose: 134.11 mg/dL

## 2021-12-18 LAB — RESP PANEL BY RT-PCR (FLU A&B, COVID) ARPGX2
Influenza A by PCR: NEGATIVE
Influenza B by PCR: NEGATIVE
SARS Coronavirus 2 by RT PCR: NEGATIVE

## 2021-12-18 LAB — HIV ANTIBODY (ROUTINE TESTING W REFLEX): HIV Screen 4th Generation wRfx: NONREACTIVE

## 2021-12-18 LAB — PROCALCITONIN: Procalcitonin: 0.12 ng/mL

## 2021-12-18 MED ORDER — INSULIN ASPART 100 UNIT/ML IJ SOLN
0.0000 [IU] | Freq: Three times a day (TID) | INTRAMUSCULAR | Status: DC
Start: 2021-12-18 — End: 2021-12-20
  Administered 2021-12-18: 13:00:00 1 [IU] via SUBCUTANEOUS
  Administered 2021-12-18: 18:00:00 2 [IU] via SUBCUTANEOUS
  Administered 2021-12-19: 13:00:00 1 [IU] via SUBCUTANEOUS

## 2021-12-18 MED ORDER — ASCORBIC ACID 500 MG PO TABS
250.0000 mg | ORAL_TABLET | Freq: Two times a day (BID) | ORAL | Status: DC
  Administered 2021-12-18 – 2021-12-22 (×8): 250 mg via ORAL
  Filled 2021-12-18 (×8): qty 1

## 2021-12-18 MED ORDER — ADULT MULTIVITAMIN W/MINERALS CH
1.0000 | ORAL_TABLET | Freq: Every day | ORAL | Status: DC
  Administered 2021-12-19 – 2021-12-22 (×4): 1 via ORAL
  Filled 2021-12-18 (×4): qty 1

## 2021-12-18 MED ORDER — INSULIN ASPART 100 UNIT/ML IJ SOLN
0.0000 [IU] | Freq: Three times a day (TID) | INTRAMUSCULAR | Status: DC
  Administered 2021-12-18: 07:00:00 7 [IU] via SUBCUTANEOUS

## 2021-12-18 MED ORDER — INSULIN GLARGINE-YFGN 100 UNIT/ML ~~LOC~~ SOLN
8.0000 [IU] | Freq: Every day | SUBCUTANEOUS | Status: DC
  Administered 2021-12-18 – 2021-12-19 (×2): 8 [IU] via SUBCUTANEOUS
  Filled 2021-12-18 (×3): qty 0.08

## 2021-12-18 MED ORDER — INSULIN GLARGINE-YFGN 100 UNIT/ML ~~LOC~~ SOLN
5.0000 [IU] | Freq: Every day | SUBCUTANEOUS | Status: DC
  Filled 2021-12-18: qty 0.05

## 2021-12-18 MED ORDER — INSULIN ASPART 100 UNIT/ML IJ SOLN
2.0000 [IU] | Freq: Three times a day (TID) | INTRAMUSCULAR | Status: DC
  Administered 2021-12-18 – 2021-12-19 (×4): 2 [IU] via SUBCUTANEOUS

## 2021-12-18 MED ORDER — CYCLOSPORINE 25 MG PO CAPS
75.0000 mg | ORAL_CAPSULE | Freq: Two times a day (BID) | ORAL | Status: DC
  Administered 2021-12-18 – 2021-12-22 (×10): 75 mg via ORAL
  Filled 2021-12-18 (×12): qty 3

## 2021-12-18 MED ORDER — SUCRALFATE 1 GM/10ML PO SUSP
1.0000 g | Freq: Three times a day (TID) | ORAL | Status: DC
  Administered 2021-12-18 – 2021-12-22 (×16): 1 g via ORAL
  Filled 2021-12-18 (×16): qty 10

## 2021-12-18 MED ORDER — PANTOPRAZOLE SODIUM 40 MG PO TBEC
40.0000 mg | DELAYED_RELEASE_TABLET | Freq: Every day | ORAL | Status: DC
  Administered 2021-12-19: 09:00:00 40 mg via ORAL
  Filled 2021-12-18: qty 1

## 2021-12-18 MED ORDER — INSULIN ASPART 100 UNIT/ML IJ SOLN
0.0000 [IU] | Freq: Every day | INTRAMUSCULAR | Status: DC
  Administered 2021-12-19: 21:00:00 0 [IU] via SUBCUTANEOUS

## 2021-12-18 MED ORDER — CHLORHEXIDINE GLUCONATE CLOTH 2 % EX PADS
6.0000 | MEDICATED_PAD | Freq: Every day | CUTANEOUS | Status: DC
  Administered 2021-12-18 – 2021-12-22 (×5): 6 via TOPICAL

## 2021-12-18 MED ORDER — ENSURE ENLIVE PO LIQD
237.0000 mL | Freq: Two times a day (BID) | ORAL | Status: DC
  Administered 2021-12-19 – 2021-12-22 (×8): 237 mL via ORAL

## 2021-12-18 MED ORDER — SODIUM ZIRCONIUM CYCLOSILICATE 10 G PO PACK
10.0000 g | PACK | ORAL | Status: AC
  Administered 2021-12-18 (×2): 10 g via ORAL
  Filled 2021-12-18 (×2): qty 1

## 2021-12-18 MED ORDER — SODIUM CHLORIDE 0.9 % IV SOLN
INTRAVENOUS | Status: DC
Start: 2021-12-18 — End: 2021-12-19

## 2021-12-18 MED ORDER — SODIUM CHLORIDE 0.9 % IV SOLN
1.0000 g | Freq: Every day | INTRAVENOUS | Status: AC
  Administered 2021-12-18 – 2021-12-22 (×5): 1 g via INTRAVENOUS
  Filled 2021-12-18 (×5): qty 10

## 2021-12-18 NOTE — Progress Notes (Signed)
Transition of Care [facility]) Screening Note  Patient Details  Name: [name] Date of Birth: 1961-08-25  Transition of Care [facility]) CM/SW Contact:    Sherie Don, LCSW Phone Number: 12/18/2021, 9:39 AM  Transition of Care Department [facility]) has reviewed patient and no TOC needs have been identified at this time. We will continue to monitor patient advancement through interdisciplinary progression rounds. If new patient transition needs arise, please place a TOC consult.

## 2021-12-18 NOTE — Progress Notes (Signed)
PROGRESS NOTE    [name]  RDE:[id number] DOB: Mar 27, 1961 DOA: 12/17/2021 PCP: [name], MD    Brief Narrative:  [name] was admitted to the hospital with the working diagnosis of acute pyelonephritis complicated with acute renal failure   2b yo female with the past medical history of T1DM, sp renal transplant, CKD stage 2, stage IV decubitus ulcer, right BKA, and chronic urinary indwelling foley cathter who presented with nausea. Patient reported 2 days of nausea and vomiting, no chest pain or dyspnea. Because of persistent symptoms she came to the hospital for further evaluation, on her initial physical examination her blood pressure was 96/68, HR 110, temp 97.5, RR 27 ill looking appearing, lungs clear to auscultation, heart with S1 and S2 present and rhythmic, abdomen soft and non tender, right BKA, and large stage IV decubitus pressure ulcer.   Sodium 138, potassium 4.6, chloride 105, bicarb 24, glucose 178, BUN 56, creatinine 1.43. White count 9.4, hemoglobin 11.6, hematocrit 35.9, platelets 222. TSH 13.2. SARS COVID-19 negative.  Urinalysis specific gravity less than 1.005, 100 protein, 21-50 red cells, 21-50 white cells.  CT of the abdomen with urothelial thickening of the transplanted ureter.  Findings concerning for infection.  Foci of gas within the urinary bladder lumen.  Marked diffuse circumferential thickening of the visualized distal esophagus.  Cholelithiasis without cholecystitis.  Deep sacral decubitus ulcer, no abscess formation.  Chest radiograph, no infiltrates, bibasilar atelectasis.  EKG 98 bpm, right ward axis, normal intervals, sinus rhythm with poor R wave progression, Q wave in V3-V4, lead III-aVF, no significant ST segment changes or T wave changes, low voltage.  Assessment & Plan:   Principal Problem:   Acute kidney injury superimposed on CKD ([place]) Active Problems:   History of renal transplant   Chronic kidney disease (CKD) stage  G3a/A1, moderately decreased glomerular filtration rate (GFR) between 45-59 mL/min/1.73 square meter and albuminuria creatinine ratio less than 30 mg/g (HCC)   Type 1 diabetes mellitus with complication, with long term current use of insulin pump (HCC)   Long-term use of immunosuppressant medication   Coffee ground emesis   Sacral decubitus ulcer, stage IV (HCC)   Anemia   Chronic osteomyelitis of sacrum (HCC)   Chronic indwelling Foley catheter   S/P BKA (below knee amputation) unilateral, right (HCC)   DNR (do not resuscitate)/DNI(Do Not Intubate)   AKI (acute kidney injury) ([place])   Acute pyelonephritis of transplanted kidney, complicated with severe sepsis (present on admission) end organ failure hypotension.  Urine infection related to indwelling foley cathter.  Patient feeling better, but not yet back to baseline, continue to have nausea but not vomiting, continue to be very weak and deconditioned.   Plan to continue antibiotic therapy with ceftriaxone Volume repletion with isotonic saline at 75 ml per hr Follow up on cultures, cell count and temperature curve.   2. T1DM with hyperglycemia. Patient continue to have nausea but is tolerating po diet.  Fasting glucose this am was 321, with anion gap at 12.  Her insulin pump was discontinued on admission.  Plan to resume insulin therapy with basal 8 units and pre-meal 2 units, plus insulin sliding scale for glucose cover and monitoring. Patient is insulin sensitive.   3. AKI on CKD stage 2. (Baseline cr is 1,0 not CKD stage 3). Hyperkalemia and non anion gap metabolic acidosis.   Renal function today with serum cr at 1,49, with K at 5,3 and serum bicarbonate at 17.  Plan to continue volume  repletion with isotonic saline at 75 ml per hr Add 2 doses of sodium zirconium and follow up electrolytes in am.   Continue immunosuppressive therapy for renal transplant with imuran, prednisone and cyclosporin.     4. Chronic stage IV  decubitus ulcer, with chronic indwelling foley cathter,.  Continue with local wound care, consulted wound care team.   5. Acute esophagitis. Continue antiacid therapy with pantoprazole and as needed antiemetics.  Add sucralfate.   Patient continue to be at high risk for worsening sepsis   Status is: Inpatient  Remains inpatient appropriate because: IV antibiotic therapy    DVT prophylaxis:  Scd   Code Status:    full  Family Communication:   No family at the bedside      Consultants:  Wound care     Antimicrobials:  Ceftriaxone     Subjective:  Patient continue to be very weak and deconditioned, continue to have nausea but not vomiting. She uses a wheelchair for mobility at home   Objective: Vitals:   12/17/21 2130 12/18/21 0000 12/18/21 0100 12/18/21 0518  BP: (!) 145/86 (!) 151/84 (!) 149/80 (!) 126/57  Pulse: 86 94 95 (!) 106  Resp: 16 12 16 17   Temp:  98.3 F (36.8 C) 98 F (36.7 C) 98.3 F (36.8 C)  TempSrc:   Oral Oral  SpO2: 98% 97% 99% 100%  Weight:      Height:        Intake/Output Summary (Last 24 hours) at 12/18/2021 1236 Last data filed at 12/18/2021 0518 Gross per 24 hour  Intake 909.31 ml  Output 800 ml  Net 109.31 ml   Filed Weights   12/17/21 1611  Weight: 46.7 kg    Examination:   General: deconditioned and ill looking appearing  Neurology: Awake and alert, non focal  E ENT: no pallor, no icterus, oral mucosa moist Cardiovascular: No JVD. S1-S2 present, rhythmic, no gallops, rubs, or murmurs. No left lower extremity edema. Pulmonary:  positive breath sounds bilaterally,  with no wheezing, rhonchi or rales. Gastrointestinal. Abdomen soft and non tender Skin. No rashes Musculoskeletal: right BKA     Data Reviewed: I have personally reviewed following labs and imaging studies  CBC: Recent Labs  Lab 12/17/21 1719 12/18/21 0356  WBC 9.4 8.8  NEUTROABS 7.7 6.8  HGB 11.6* 9.6*  HCT 35.9* 30.1*  MCV 113.6* 114.9*  PLT 222  161   Basic Metabolic Panel: Recent Labs  Lab 12/17/21 1719 12/18/21 0356  NA 138 138  K 4.6 5.3*  CL 105 109  CO2 24 17*  GLUCOSE 178* 321*  BUN 56* 58*  CREATININE 1.43* 1.49*  CALCIUM 9.9 9.1   GFR: Estimated Creatinine Clearance: 28.9 mL/min (A) (by C-G formula based on SCr of 1.49 mg/dL (H)). Liver Function Tests: Recent Labs  Lab 12/17/21 1719 12/18/21 0356  AST 18 15  ALT 10 11  ALKPHOS 94 80  BILITOT 1.0 1.4*  PROT 6.8 5.9*  ALBUMIN 3.3* 2.7*   Recent Labs  Lab 12/17/21 1719  LIPASE 32   No results for input(s): AMMONIA in the last 168 hours. Coagulation Profile: No results for input(s): INR, PROTIME in the last 168 hours. Cardiac Enzymes: No results for input(s): CKTOTAL, CKMB, CKMBINDEX, TROPONINI in the last 168 hours. BNP (last 3 results) No results for input(s): PROBNP in the last 8760 hours. HbA1C: Recent Labs    12/18/21 0356  HGBA1C 6.3*   CBG: Recent Labs  Lab 12/18/21 0057 12/18/21 0512 12/18/21  0321 12/18/21 0718 12/18/21 1135  GLUCAP 245* 314* 315* 304* 191*   Lipid Profile: No results for input(s): CHOL, HDL, LDLCALC, TRIG, CHOLHDL, LDLDIRECT in the last 72 hours. Thyroid Function Tests: Recent Labs    12/17/21 1719  TSH 13.264*   Anemia Panel: No results for input(s): VITAMINB12, FOLATE, FERRITIN, TIBC, IRON, RETICCTPCT in the last 72 hours.    Radiology Studies: I have reviewed all of the imaging during this hospital visit personally     Scheduled Meds:  azaTHIOprine  50 mg Oral Daily   Chlorhexidine Gluconate Cloth  6 each Topical Daily   cycloSPORINE  75 mg Oral BID   insulin aspart  0-5 Units Subcutaneous QHS   insulin aspart  0-6 Units Subcutaneous TID WC   insulin aspart  2 Units Subcutaneous TID WC   insulin glargine-yfgn  8 Units Subcutaneous Daily   pantoprazole (PROTONIX) IV  40 mg Intravenous Q12H   predniSONE  5 mg Oral Q breakfast   Continuous Infusions:   LOS: 0 days        Lazaro Isenhower  Gerome Apley, MD

## 2021-12-18 NOTE — Consult Note (Signed)
WOC Nurse Consult Note: Patient receiving care in [facility].  Reason for Consult: sacral wound. Wound type: Chronic wound, stage 3. Pressure Injury POA: Yes Measurement: 4.5 cm x 2.2 cm x 0.3 cm Wound bed: slick pink Drainage (amount, consistency, odor) no odor Periwound: fungal overgrowth to intact skin around the wound Dressing procedure/placement/frequency:  BEDSIDE NURSE TO PERFORM: Apply Skin Barrier Film wipe on the reddened skin around the sacral wound. WHILE IT IS STILL WET, sprinkle Antifungal Powder (from the clean utility room) on the wet skin film area. Place Xeroform gauze IN the wound--not over the intact skin. Cut the Xeroform if you need to. Then cover with an ABD pad.  Tape in place. Change daily and prn soilage.  Monitor the wound area(s) for worsening of condition such as: Signs/symptoms of infection,  Increase in size,  Development of or worsening of odor, Development of pain, or increased pain at the affected locations.  Notify the medical team if any of these develop.  Thank you for the consult.  Discussed plan of care with the patient and bedside nurse.  [facility] nurse will not follow at this time.  Please re-consult the [facility] team if needed.  Val Riles, RN, MSN, CWOCN, CNS-BC, pager [phone]

## 2021-12-18 NOTE — Progress Notes (Signed)
Inpatient Diabetes Program Recommendations  AACE/ADA: New Consensus Statement on Inpatient Glycemic Control (2015)  Target Ranges:  Prepandial:   less than 140 mg/dL      Peak postprandial:   less than 180 mg/dL (1-2 hours)      Critically ill patients:  140 - 180 mg/dL   Lab Results  Component Value Date   GLUCAP 304 (H) 12/18/2021   HGBA1C 7.9 (H) 02/25/2021    Review of Glycemic Control  Latest Reference Range & Units 12/17/21 16:26 12/18/21 00:57 12/18/21 05:12 12/18/21 06:24 12/18/21 07:18  Glucose-Capillary 70 - 99 mg/dL 152 (H) 245 (H) 314 (H) 315 (H) 304 (H)  (H): Data is abnormally high Diabetes history: Type 1 DM  Outpatient Diabetes medications:  Omnipod insulin pump Basal 12a 0.35, 9a 0.55, 7p 0.45. I:C 16. ISF 60. Total basal= 10.9 units/24 hours 40   Current orders for Inpatient glycemic control:  Semglee 5 units daily, Novolog sensitive tid with meals and HS Inpatient Diabetes Program Recommendations:    Note insulin pump removed last PM prior to CT scan.   Blood sugar>300 mg/dL this AM and CO2=17.   Patient received Novolog 7 units for CBG this AM.    -May need fluids and also needs basal insulin now.   Recommend recheck of BMP at 12 noon.    -Increase Semglee to 8 units daily -Please reduce Novolog to very sensitive q 4 hours and add Novolog meal coverage 2 units tid with meals (hold if patient eats less than 50% or NPO).     Thanks,  Adah Perl, RN, BC-ADM Inpatient Diabetes Coordinator Pager [phone]  (8a-5p)

## 2021-12-18 NOTE — Progress Notes (Signed)
Initial Nutrition Assessment  DOCUMENTATION CODES:   Non-severe (moderate) malnutrition in context of chronic illness, Underweight  INTERVENTION:   -Ensure Enlive po BID, each supplement provides 350 kcal and 20 grams of protein  -Multivitamin with minerals daily  -250 mg Vitamin C BID  NUTRITION DIAGNOSIS:   Moderate Malnutrition related to chronic illness as evidenced by mild fat depletion, moderate muscle depletion.  GOAL:   Patient will meet greater than or equal to 90% of their needs  MONITOR:   PO intake, Supplement acceptance, Labs, Weight trends, I & O's, Skin  REASON FOR ASSESSMENT:   Consult Assessment of nutrition requirement/status  ASSESSMENT:   1b yo female with the past medical history of T1DM, sp renal transplant, CKD stage 2, stage IV decubitus ulcer, right BKA, and chronic urinary indwelling foley cathter who presented with nausea. Patient reported 2 days of nausea and vomiting, no chest pain or dyspnea.  Patient in room with husband at bedside. Pt reports she was doing well until about a month ago when she had an ill-fitting catheter placed. Pt with history of PEG tube, which was removed in March 2022.  Pt denies issues with chewing/swallowing.   Pt states she has not eaten anything but oatmeal so far today. Plans to order some dinner later. She is agreeable to receiving Ensure supplements. States at home she drinks Muscle Milk.  Per weight records, pt has lost 8 lbs since 01/08/21 (7% wt loss x 11 months, insignificant for time frame).  Medications: IV Zofran  Labs reviewed:  CBGs: 191-315  NUTRITION - FOCUSED PHYSICAL EXAM:  Flowsheet Row Most Recent Value  Orbital Region Mild depletion  Upper Arm Region Severe depletion  Buccal Region Mild depletion  Temple Region Mild depletion  Clavicle Bone Region Moderate depletion  Clavicle and Acromion Bone Region Moderate depletion  Scapular Bone Region Moderate depletion  Dorsal Hand Moderate  depletion  Patellar Region Moderate depletion  Anterior Thigh Region Moderate depletion  Posterior Calf Region Moderate depletion  Edema (RD Assessment) None  Hair Reviewed  Eyes Reviewed  Mouth Reviewed  Skin Reviewed  [dry]       Diet Order:   Diet Order             Diet Carb Modified Fluid consistency: Thin; Room service appropriate? Yes  Diet effective now                   EDUCATION NEEDS:   Education needs have been addressed  Skin:  Skin Assessment: Skin Integrity Issues: Skin Integrity Issues:: Stage III Stage III: sacrum Rt BKA  Last BM:  PTA  Height:   Ht Readings from Last 1 Encounters:  12/17/21 5\' 3"  (1.6 m)    Weight:   Wt Readings from Last 1 Encounters:  12/17/21 46.7 kg    BMI:  Body mass index is 18.25 kg/m.  Estimated Nutritional Needs:   Kcal:  1500-1700  Protein:  75-90g  Fluid:  1.7L/day   Clayton Bibles, MS, RD, LDN Inpatient Clinical Dietitian Contact information available via [employer]

## 2021-12-19 LAB — CBC
HCT: 24.5 % — ABNORMAL LOW (ref 36.0–46.0)
Hemoglobin: 7.6 g/dL — ABNORMAL LOW (ref 12.0–15.0)
MCH: 36 pg — ABNORMAL HIGH (ref 26.0–34.0)
MCHC: 31 g/dL (ref 30.0–36.0)
MCV: 116.1 fL — ABNORMAL HIGH (ref 80.0–100.0)
Platelets: 192 10*3/uL (ref 150–400)
RBC: 2.11 MIL/uL — ABNORMAL LOW (ref 3.87–5.11)
RDW: 15.2 % (ref 11.5–15.5)
WBC: 6.7 10*3/uL (ref 4.0–10.5)
nRBC: 0 % (ref 0.0–0.2)

## 2021-12-19 LAB — BASIC METABOLIC PANEL
Anion gap: 10 (ref 5–15)
BUN: 59 mg/dL — ABNORMAL HIGH (ref 8–23)
CO2: 17 mmol/L — ABNORMAL LOW (ref 22–32)
Calcium: 8.7 mg/dL — ABNORMAL LOW (ref 8.9–10.3)
Chloride: 111 mmol/L (ref 98–111)
Creatinine, Ser: 1.61 mg/dL — ABNORMAL HIGH (ref 0.44–1.00)
GFR, Estimated: 36 mL/min — ABNORMAL LOW (ref >60)
Glucose, Bld: 196 mg/dL — ABNORMAL HIGH (ref 70–99)
Potassium: 4.4 mmol/L (ref 3.5–5.1)
Sodium: 138 mmol/L (ref 135–145)

## 2021-12-19 LAB — GLUCOSE, CAPILLARY
Glucose-Capillary: 172 mg/dL — ABNORMAL HIGH (ref 70–99)
Glucose-Capillary: 171 mg/dL — ABNORMAL HIGH (ref 70–99)
Glucose-Capillary: 259 mg/dL — ABNORMAL HIGH (ref 70–99)
Glucose-Capillary: 191 mg/dL — ABNORMAL HIGH (ref 70–99)

## 2021-12-19 MED ORDER — PROMETHAZINE HCL 25 MG PO TABS
12.5000 mg | ORAL_TABLET | Freq: Four times a day (QID) | ORAL | Status: DC | PRN
  Administered 2021-12-20: 10:00:00 12.5 mg via ORAL
  Filled 2021-12-19: qty 1

## 2021-12-19 MED ORDER — PANTOPRAZOLE SODIUM 40 MG PO TBEC
40.0000 mg | DELAYED_RELEASE_TABLET | Freq: Two times a day (BID) | ORAL | Status: DC
  Administered 2021-12-19 – 2021-12-22 (×6): 40 mg via ORAL
  Filled 2021-12-19 (×6): qty 1

## 2021-12-19 MED ORDER — STERILE WATER FOR INJECTION IV SOLN
INTRAVENOUS | Status: DC
  Filled 2021-12-19 (×2): qty 150
  Filled 2021-12-19: qty 1000

## 2021-12-19 MED ORDER — HYDROMORPHONE HCL 1 MG/ML IJ SOLN: 1.0000 mg | INTRAMUSCULAR | Status: DC | PRN

## 2021-12-19 MED ORDER — OXYCODONE HCL 5 MG PO TABS
5.0000 mg | ORAL_TABLET | ORAL | Status: DC | PRN
  Administered 2021-12-19 – 2021-12-22 (×6): 5 mg via ORAL
  Filled 2021-12-19 (×6): qty 1

## 2021-12-19 NOTE — Plan of Care (Signed)

## 2021-12-19 NOTE — Progress Notes (Signed)
PROGRESS NOTE    [name]  SEG:[id number] DOB: 05-25-1961 DOA: 12/17/2021 PCP: [name], MD    Brief Narrative:  [name] was admitted to the hospital with the working diagnosis of acute pyelonephritis complicated with acute renal failure    58b yo female with the past medical history of T1DM, sp renal transplant, CKD stage 2, stage IV decubitus ulcer, right BKA, and chronic urinary indwelling foley cathter who presented with nausea. Patient reported 2 days of nausea and vomiting, no chest pain or dyspnea. Because of persistent symptoms she came to the hospital for further evaluation, on her initial physical examination her blood pressure was 96/68, HR 110, temp 97.5, RR 27 ill looking appearing, lungs clear to auscultation, heart with S1 and S2 present and rhythmic, abdomen soft and non tender, right BKA, and large stage IV decubitus pressure ulcer.    Sodium 138, potassium 4.6, chloride 105, bicarb 24, glucose 178, BUN 56, creatinine 1.43. White count 9.4, hemoglobin 11.6, hematocrit 35.9, platelets 222. TSH 13.2. SARS COVID-19 negative.   Urinalysis specific gravity less than 1.005, 100 protein, 21-50 red cells, 21-50 white cells.   CT of the abdomen with urothelial thickening of the transplanted ureter.  Findings concerning for infection.  Foci of gas within the urinary bladder lumen.  Marked diffuse circumferential thickening of the visualized distal esophagus.  Cholelithiasis without cholecystitis.  Deep sacral decubitus ulcer, no abscess formation.   Chest radiograph, no infiltrates, bibasilar atelectasis.   EKG 98 bpm, right ward axis, normal intervals, sinus rhythm with poor R wave progression, Q wave in V3-V4, lead III-aVF, no significant ST segment changes or T wave changes, low voltage.  Patient was placed on antibiotic therapy and supportive IV fluids.    Assessment & Plan:   Principal Problem:   Acute kidney injury superimposed on CKD ([place]) Active  Problems:   History of renal transplant   Chronic kidney disease (CKD) stage G3a/A1, moderately decreased glomerular filtration rate (GFR) between 45-59 mL/min/1.73 square meter and albuminuria creatinine ratio less than 30 mg/g (HCC)   Type 1 diabetes mellitus with complication, with long term current use of insulin pump (HCC)   Long-term use of immunosuppressant medication   Coffee ground emesis   Sacral decubitus ulcer, stage IV (HCC)   Anemia   Chronic osteomyelitis of sacrum (HCC)   Chronic indwelling Foley catheter   S/P BKA (below knee amputation) unilateral, right (HCC)   DNR (do not resuscitate)/DNI(Do Not Intubate)   AKI (acute kidney injury) ([place])   Acute pyelonephritis of transplanted kidney, complicated with severe sepsis (present on admission) end organ failure hypotension.  Urine infection related to indwelling foley cathter.  Positive nausea and dyspepsia, no vomiting.  Wbc is 6,7 and Urine culture positive for > 100,000 CFU of Klebsiella oxytoca.   Plan to continue antibiotic therapy with ceftriaxone IV and follow up on sensitivities.  Continue to follow up on cell count and temperature curve.  Blood cultures with no growth.    2. T1DM with hyperglycemia.  Mild nausea and dyspepsia but no nausea or vomiting.  Fasting glucose this am 196,   Continue with insulin therapy: basal 8 units and pre-meal 2 units, plus insulin sliding scale for glucose cover and monitoring. Patient is insulin sensitive.  Continue close glucose monitoring    3. AKI on CKD stage 2. (Baseline cr is 1,0 not CKD stage 3). Hyperkalemia and non anion gap metabolic acidosis.  Renal function with serum cr at 1,61, with K at  4,4 and serum bicarbonate at 17, Cl is 111 and anion gap is 10.  Urine output per foley cathter 500 cc.   Will continue IV fluids with bicarb drip at 100 ml per hr and will follow up renal function in am, avoid hypotension and nephrotoxic medications.    Continue  immunosuppressive therapy for renal transplant with imuran, prednisone and cyclosporin.     4. Chronic stage IV decubitus ulcer, with chronic indwelling foley cathter,.  Local wound care, consulted wound care team for dressing changes.   PT and OT evaluation    5. Acute esophagitis. Moderate calorie protein malnutrition Antiacid therapy with pantoprazole and sucralfate. Continue as needed nausea control, will change to phenergan.   Patient continue to be at high risk for  worsening renal function and sepsis   Status is: Inpatient  Remains inpatient appropriate because: IV antibiotic therapy and renal function monitoring   DVT prophylaxis:  Heparin  Code Status:    full  Family Communication:   No family at the bedside      Nutrition Status: Nutrition Problem: Moderate Malnutrition Etiology: chronic illness Signs/Symptoms: mild fat depletion, moderate muscle depletion Interventions: Ensure Enlive (each supplement provides 350kcal and 20 grams of protein), MVI     Skin Documentation: Pressure Injury 12/10/20 Heel Left;Posterior Stage 2 -  Partial thickness loss of dermis presenting as a shallow open injury with a red, pink wound bed without slough. (Active)  12/10/20 1325  Location: Heel  Location Orientation: Left;Posterior  Staging: Stage 2 -  Partial thickness loss of dermis presenting as a shallow open injury with a red, pink wound bed without slough.  Wound Description (Comments):   Present on Admission: Yes     Pressure Injury 12/24/20 Sacrum Stage 4 - Full thickness tissue loss with exposed bone, tendon or muscle. (Active)  12/24/20   Location: Sacrum  Location Orientation:   Staging: Stage 4 - Full thickness tissue loss with exposed bone, tendon or muscle.  Wound Description (Comments):   Present on Admission: Yes     Pressure Injury 02/26/21 Buttocks Right Stage 2 -  Partial thickness loss of dermis presenting as a shallow open injury with a red, pink wound bed  without slough. (Active)  02/26/21 0600  Location: Buttocks  Location Orientation: Right  Staging: Stage 2 -  Partial thickness loss of dermis presenting as a shallow open injury with a red, pink wound bed without slough.  Wound Description (Comments):   Present on Admission:       Antimicrobials:  Ceftriaxone     Subjective: Patient continue to have dyspepsia and nausea but not vomiting, no dyspnea or chest pain,   Objective: Vitals:   12/18/21 1345 12/18/21 2147 12/19/21 0500 12/19/21 0631  BP: 133/63 (!) 145/75  (!) 148/72  Pulse: 93 92  78  Resp: 18 18  16   Temp: 98.4 F (36.9 C) 98.8 F (37.1 C)  98.6 F (37 C)  TempSrc: Oral Oral  Oral  SpO2: 99% 100%  98%  Weight:   46.3 kg   Height:        Intake/Output Summary (Last 24 hours) at 12/19/2021 1301 Last data filed at 12/19/2021 1000 Gross per 24 hour  Intake 2579.46 ml  Output 500 ml  Net 2079.46 ml   Filed Weights   12/17/21 1611 12/19/21 0500  Weight: 46.7 kg 46.3 kg    Examination:   General: Not in pain or dyspnea, deconditioned  Neurology: Awake and alert, non focal  E  ENT: mild pallor, no icterus, oral mucosa moist Cardiovascular: No JVD. S1-S2 present, rhythmic, no gallops, rubs, or murmurs. No left lower extremity edema. Pulmonary: positive breath sounds bilaterally, with no wheezing, rhonchi or rales. Gastrointestinal. Abdomen soft and non tender Skin. No rashes Musculoskeletal: right BKA     Data Reviewed: I have personally reviewed following labs and imaging studies  CBC: Recent Labs  Lab 12/17/21 1719 12/18/21 0356 12/19/21 0321  WBC 9.4 8.8 6.7  NEUTROABS 7.7 6.8  --   HGB 11.6* 9.6* 7.6*  HCT 35.9* 30.1* 24.5*  MCV 113.6* 114.9* 116.1*  PLT 222 196 814   Basic Metabolic Panel: Recent Labs  Lab 12/17/21 1719 12/18/21 0356 12/19/21 0321  NA 138 138 138  K 4.6 5.3* 4.4  CL 105 109 111  CO2 24 17* 17*  GLUCOSE 178* 321* 196*  BUN 56* 58* 59*  CREATININE 1.43* 1.49*  1.61*  CALCIUM 9.9 9.1 8.7*   GFR: Estimated Creatinine Clearance: 26.5 mL/min (A) (by C-G formula based on SCr of 1.61 mg/dL (H)). Liver Function Tests: Recent Labs  Lab 12/17/21 1719 12/18/21 0356  AST 18 15  ALT 10 11  ALKPHOS 94 80  BILITOT 1.0 1.4*  PROT 6.8 5.9*  ALBUMIN 3.3* 2.7*   Recent Labs  Lab 12/17/21 1719  LIPASE 32   No results for input(s): AMMONIA in the last 168 hours. Coagulation Profile: No results for input(s): INR, PROTIME in the last 168 hours. Cardiac Enzymes: No results for input(s): CKTOTAL, CKMB, CKMBINDEX, TROPONINI in the last 168 hours. BNP (last 3 results) No results for input(s): PROBNP in the last 8760 hours. HbA1C: Recent Labs    12/18/21 0356  HGBA1C 6.3*   CBG: Recent Labs  Lab 12/18/21 1135 12/18/21 1720 12/18/21 2150 12/19/21 0735 12/19/21 1138  GLUCAP 191* 207* 193* 172* 171*   Lipid Profile: No results for input(s): CHOL, HDL, LDLCALC, TRIG, CHOLHDL, LDLDIRECT in the last 72 hours. Thyroid Function Tests: Recent Labs    12/17/21 1719  TSH 13.264*   Anemia Panel: No results for input(s): VITAMINB12, FOLATE, FERRITIN, TIBC, IRON, RETICCTPCT in the last 72 hours.    Radiology Studies: I have reviewed all of the imaging during this hospital visit personally     Scheduled Meds:  vitamin C  250 mg Oral BID   azaTHIOprine  50 mg Oral Daily   Chlorhexidine Gluconate Cloth  6 each Topical Daily   cycloSPORINE  75 mg Oral BID   feeding supplement  237 mL Oral BID BM   insulin aspart  0-5 Units Subcutaneous QHS   insulin aspart  0-6 Units Subcutaneous TID WC   insulin aspart  2 Units Subcutaneous TID WC   insulin glargine-yfgn  8 Units Subcutaneous Daily   multivitamin with minerals  1 tablet Oral Daily   pantoprazole  40 mg Oral Daily   predniSONE  5 mg Oral Q breakfast   sucralfate  1 g Oral TID WC & HS   Continuous Infusions:  sodium chloride 100 mL/hr at 12/19/21 0720   cefTRIAXone (ROCEPHIN)  IV 1 g  (12/19/21 1031)     LOS: 1 day        Shawneequa Baldridge Gerome Apley, MD

## 2021-12-19 NOTE — Evaluation (Signed)
Physical Therapy Evaluation Patient Details Name: [name] MRN: [id number] DOB: January 12, 1961 Today's Date: 12/19/2021  History of Present Illness  Patient is a 60 year old female who presented to the hospital with nausea and vomitting. patient was admitted with acute pyelonephritis, acute renal failure. PMH: type I DM, renal transfer, CKD, R BKA, IV decubituce ulcer.  Clinical Impression  Patient admitted as above and presenting with functional mobility limitations 2* generalized weakness, balance deficits, poor endurance and R BKA.  Patient and husband plan to transition back home and continue [facility] services. Husband reported being able to support patients need for 24/7 support in next level of care.  Patient would continue to benefit from skilled PT services at this time while admitted and after d/c to address noted deficits in order to maximize IND and safety.     Recommendations for follow up therapy are one component of a multi-disciplinary discharge planning process, led by the attending physician.  Recommendations may be updated based on patient status, additional functional criteria and insurance authorization.  Follow Up Recommendations Home health PT    Assistance Recommended at Discharge Intermittent Supervision/Assistance  Functional Status Assessment Patient has had a recent decline in their functional status and demonstrates the ability to make significant improvements in function in a reasonable and predictable amount of time.  Equipment Recommendations  None recommended by PT    Recommendations for Other Services       Precautions / Restrictions Precautions Precautions: Fall Precaution Comments: R BKA, IV wound on sacral area. Restrictions Weight Bearing Restrictions: No      Mobility  Bed Mobility Overal bed mobility: Needs Assistance Bed Mobility: Supine to Sit     Supine to sit: Mod assist;+2 for safety/equipment     General bed mobility comments: with  increased time with lateral lean to R side.    Transfers Overall transfer level: Needs assistance Equipment used: None Transfers: Bed to chair/wheelchair/BSC            Lateral/Scoot Transfers: +2 safety/equipment;Mod assist;Max assist      Ambulation/Gait               General Gait Details: Pt nonambulatory at this time  Stairs            Wheelchair Mobility    Modified Rankin (Stroke Patients Only)       Balance Overall balance assessment: Needs assistance Sitting-balance support: Bilateral upper extremity supported;Feet supported Sitting balance-Leahy Scale: Poor Sitting balance - Comments: inital sitting balance noted to have R lateral lean with increased time to participate in task. patient was able to improve to min guard sitting on edge of bed until fatigue.     Standing balance-Leahy Scale: Zero                               Pertinent Vitals/Pain Pain Assessment: 0-10 Pain Score: 7  Pain Location: wound Pain Descriptors / Indicators: Discomfort;Grimacing Pain Intervention(s): Limited activity within patient's tolerance;Monitored during session;Premedicated before session    Home Living Family/patient expects to be discharged to:: Private residence Living Arrangements: Spouse/significant other Available Help at Discharge: Family Type of Home: House Home Access: Level entry       Home Layout: One level Home Equipment: Rollator (4 wheels);Rolling Walker (2 wheels);Cane - single point;Wheelchair - manual Additional Comments: patient reported w/c transfers with husband help at home. patient has hospital bed with air matress at home.    Prior  Function Prior Level of Function : Needs assist       Physical Assist : ADLs (physical)   ADLs (physical): Bathing;Dressing;Toileting;IADLs Mobility Comments: BUE for self propelling w/c. ADLs Comments: patient is set up for UB and mod A for LB dressing/bathing tasks. patient was using  absorbent undergarments for BMs.     Hand Dominance   Dominant Hand: Right    Extremity/Trunk Assessment   Upper Extremity Assessment Upper Extremity Assessment: Generalized weakness    Lower Extremity Assessment Lower Extremity Assessment: Generalized weakness;RLE deficits/detail RLE Deficits / Details: BKA - pt states being fit for prosthesis next month    Cervical / Trunk Assessment Cervical / Trunk Assessment: Kyphotic  Communication   Communication: No difficulties  Cognition Arousal/Alertness: Awake/alert Behavior During Therapy: WFL for tasks assessed/performed Overall Cognitive Status: Within Functional Limits for tasks assessed                                 General Comments: husband was also present to confirm what patient was reporting for PLOF        General Comments      Exercises     Assessment/Plan    PT Assessment Patient needs continued PT services  PT Problem List Decreased strength;Decreased activity tolerance;Decreased balance;Decreased mobility;Decreased knowledge of use of DME;Pain       PT Treatment Interventions DME instruction;Functional mobility training;Therapeutic activities;Therapeutic exercise;Balance training;Patient/family education    PT Goals (Current goals can be found in the Care Plan section)  Acute Rehab PT Goals Patient Stated Goal: Regain IND PT Goal Formulation: With patient Time For Goal Achievement: 12/19/21 Potential to Achieve Goals: Fair    Frequency Min 3X/week   Barriers to discharge        Co-evaluation PT/OT/SLP Co-Evaluation/Treatment: Yes Reason for Co-Treatment: For patient/therapist safety PT goals addressed during session: Mobility/safety with mobility OT goals addressed during session: ADL's and self-care       AM-PAC PT "6 Clicks" Mobility  Outcome Measure Help needed turning from your back to your side while in a flat bed without using bedrails?: A Little Help needed moving  from lying on your back to sitting on the side of a flat bed without using bedrails?: A Lot Help needed moving to and from a bed to a chair (including a wheelchair)?: A Lot Help needed standing up from a chair using your arms (e.g., wheelchair or bedside chair)?: A Lot Help needed to walk in hospital room?: Total Help needed climbing 3-5 steps with a railing? : Total 6 Click Score: 11    End of Session Equipment Utilized During Treatment: Gait belt Activity Tolerance: Patient tolerated treatment well Patient left: in chair;with call bell/phone within reach;with chair alarm set;with family/visitor present Nurse Communication: Mobility status PT Visit Diagnosis: Difficulty in walking, not elsewhere classified (R26.2);Muscle weakness (generalized) (M62.81)    Time: 3149-7026 PT Time Calculation (min) (ACUTE ONLY): 32 min   Charges:   PT Evaluation $PT Eval Low Complexity: 1 Low          [place] Pager [phone] Office [phone]   Marni Franzoni 12/19/2021, 4:15 PM

## 2021-12-19 NOTE — Evaluation (Signed)
Occupational Therapy Evaluation Patient Details Name: [name] MRN: [id number] DOB: 09-16-1961 Today's Date: 12/19/2021   History of Present Illness Patient is a 60 year old female who presented to the hospital with nausea and vomitting. patient was admitted with acute pyelonephritis, acute renal failure. PMH: type I DM, renal transfer, CKD, R BKA, IV decubituce ulcer.   Clinical Impression   Patient is a 60 year old female who was set up for UB bathing and dressing tasks and sitting balance PLOF with husband support for all other tasks. Currently, patient is max A for bed mobility with lateral and posterior leaning noted with sitting on edge of bed with increased fatigue during ADL task. Patient and husband plan to transition back home and continue [name] services. Husband reported being able to support patients need for 24/7 support in next level of care.  Patient would continue to benefit from skilled OT services at this time while admitted and after d/c to address noted deficits in order to improve overall safety and independence in ADLs.        Recommendations for follow up therapy are one component of a multi-disciplinary discharge planning process, led by the attending physician.  Recommendations may be updated based on patient status, additional functional criteria and insurance authorization.   Follow Up Recommendations  Home health OT    Assistance Recommended at Discharge Frequent or constant Supervision/Assistance  Functional Status Assessment  Patient has had a recent decline in their functional status and demonstrates the ability to make significant improvements in function in a reasonable and predictable amount of time.  Equipment Recommendations  Other (comment) (patient has needed items at home)    Recommendations for Other Services       Precautions / Restrictions Precautions Precautions: Fall Precaution Comments: R BKA, IV wound on sacral  area. Restrictions Weight Bearing Restrictions: No      Mobility Bed Mobility Overal bed mobility: Needs Assistance Bed Mobility: Supine to Sit     Supine to sit: Mod assist;+2 for safety/equipment     General bed mobility comments: with increased time with lateral lean to R side.    Transfers                          Balance Overall balance assessment: Needs assistance Sitting-balance support: Bilateral upper extremity supported;Feet supported Sitting balance-Leahy Scale: Poor Sitting balance - Comments: inital sitting balance noted to have R lateral lean with increased time to participate in task. patient was able to improve to min guard sitting on edge of bed until fatigue.     Standing balance-Leahy Scale: Zero                             ADL either performed or assessed with clinical judgement   ADL Overall ADL's : Needs assistance/impaired Eating/Feeding: Set up;Bed level Eating/Feeding Details (indicate cue type and reason): patient is able to complete self feeding in a chair supported. Grooming: Oral care;Wash/dry face;Set up;Sitting Grooming Details (indicate cue type and reason): EOB with backrest provided for patient to lean on after fatiguing. patient education on importance of having upright posture for oral care and self feeding tasks. Upper Body Bathing: Moderate assistance;Sitting   Lower Body Bathing: Bed level;Total assistance Lower Body Bathing Details (indicate cue type and reason): baseline for patient husband assists at home Upper Body Dressing : Moderate assistance;Sitting   Lower Body Dressing: Total assistance;Bed level  Lower Body Dressing Details (indicate cue type and reason): baseline for patient. attempted to assist with donning shoe with max A needed to complete task at bed level. good range in hip and knee Toilet Transfer: Maximal assistance Toilet Transfer Details (indicate cue type and reason): scoot trasnfer from  edge of bed to recliner in room to simulate transfer. patient needed continued cues for proper hand and foot placement. Toileting- Clothing Manipulation and Hygiene: Total assistance;Bed level Toileting - Clothing Manipulation Details (indicate cue type and reason): has foley at baseline and uses adult absorbent undergarments per husband report for BM             Vision Patient Visual Report: No change from baseline Additional Comments: patient was noted to have had visual deficits in L eye but is her baseline.     Perception     Praxis      Pertinent Vitals/Pain Pain Assessment: 0-10 Pain Score: 7  Pain Location: wound Pain Descriptors / Indicators: Discomfort;Grimacing Pain Intervention(s): Limited activity within patient's tolerance;Repositioned;Monitored during session;Premedicated before session     Hand Dominance Right   Extremity/Trunk Assessment Upper Extremity Assessment Upper Extremity Assessment: Generalized weakness   Lower Extremity Assessment Lower Extremity Assessment: Defer to PT evaluation   Cervical / Trunk Assessment Cervical / Trunk Assessment: Kyphotic   Communication Communication Communication: No difficulties   Cognition Arousal/Alertness: Awake/alert Behavior During Therapy: WFL for tasks assessed/performed Overall Cognitive Status: Within Functional Limits for tasks assessed                                 General Comments: husband was also present to confirm what patient was reporting for PLOF     General Comments       Exercises     Shoulder Instructions      Home Living Family/patient expects to be discharged to:: Private residence Living Arrangements: Spouse/significant other Available Help at Discharge: Family Type of Home: House Home Access: Level entry     Home Layout: One level               Home Equipment: Rollator (4 wheels);Rolling Walker (2 wheels);Cane - single point;Wheelchair - manual    Additional Comments: patient reported w/c transfers with husband help at home. patient has hospital bed with air matress at home.      Prior Functioning/Environment Prior Level of Function : Needs assist       Physical Assist : ADLs (physical)   ADLs (physical): Bathing;Dressing;Toileting;IADLs Mobility Comments: BUE for self propelling w/c. ADLs Comments: patient is set up for UB and mod A for LB dressing/bathing tasks. patient was using absorbent undergarments for BMs.        OT Problem List: Decreased activity tolerance;Impaired balance (sitting and/or standing);Decreased safety awareness;Pain;Decreased knowledge of precautions;Decreased knowledge of use of DME or AE      OT Treatment/Interventions: Self-care/ADL training;Therapeutic exercise;DME and/or AE instruction;Therapeutic activities;Balance training;Patient/family education    OT Goals(Current goals can be found in the care plan section) Acute Rehab OT Goals Patient Stated Goal: to get back home OT Goal Formulation: With patient/family Time For Goal Achievement: 01/02/22 Potential to Achieve Goals: Good  OT Frequency: Min 2X/week   Barriers to D/C:            Co-evaluation PT/OT/SLP Co-Evaluation/Treatment: Yes Reason for Co-Treatment: For patient/therapist safety;To address functional/ADL transfers PT goals addressed during session: Mobility/safety with mobility OT goals addressed during session: ADL's and  self-care      AM-PAC OT "6 Clicks" Daily Activity     Outcome Measure Help from another person eating meals?: A Little Help from another person taking care of personal grooming?: A Little Help from another person toileting, which includes using toliet, bedpan, or urinal?: A Lot Help from another person bathing (including washing, rinsing, drying)?: A Lot Help from another person to put on and taking off regular upper body clothing?: A Lot Help from another person to put on and taking off regular lower  body clothing?: A Lot 6 Click Score: 14   End of Session Equipment Utilized During Treatment: Gait belt Nurse Communication: Mobility status  Activity Tolerance: Patient tolerated treatment well Patient left: in chair;with call bell/phone within reach;with chair alarm set;with family/visitor present  OT Visit Diagnosis: Unsteadiness on feet (R26.81);Muscle weakness (generalized) (M62.81)                Time: 1440-1510 OT Time Calculation (min): 30 min Charges:  OT General Charges $OT Visit: 1 Visit OT Evaluation $OT Eval Low Complexity: 1 Low  Leota Sauers, MS Acute Rehabilitation Department Office# [phone] Pager# [phone]   Marcellina Millin 12/19/2021, 4:00 PM

## 2021-12-19 NOTE — Consult Note (Signed)
Reconsulted for patient's sacral pressure injury. Seen by my associate S. Doty yesterday for this problem.  I will add today a Prevalon boot for pressure redistribution and place patient on a mattress replacement with low air loss feature.   [place] nursing team will not follow, but will remain available to this patient, the nursing and medical teams.  Please re-consult if needed. Thanks, Maudie Flakes, MSN, RN, [place], Arther Abbott  Pager# [phone]

## 2021-12-19 NOTE — Plan of Care (Signed)
  Problem: Education: Goal: Knowledge of General Education information will improve Description: Including pain rating scale, medication(s)/side effects and non-pharmacologic comfort measures Outcome: Progressing   Problem: Activity: Goal: Risk for activity intolerance will decrease Outcome: Progressing   Problem: Pain Managment: Goal: General experience of comfort will improve Outcome: Progressing   

## 2021-12-20 LAB — BASIC METABOLIC PANEL
Anion gap: 6 (ref 5–15)
BUN: 48 mg/dL — ABNORMAL HIGH (ref 8–23)
CO2: 24 mmol/L (ref 22–32)
Calcium: 8.4 mg/dL — ABNORMAL LOW (ref 8.9–10.3)
Chloride: 106 mmol/L (ref 98–111)
Creatinine, Ser: 1.29 mg/dL — ABNORMAL HIGH (ref 0.44–1.00)
GFR, Estimated: 47 mL/min — ABNORMAL LOW (ref >60)
Glucose, Bld: 85 mg/dL (ref 70–99)
Potassium: 3.6 mmol/L (ref 3.5–5.1)
Sodium: 136 mmol/L (ref 135–145)

## 2021-12-20 LAB — GLUCOSE, CAPILLARY
Glucose-Capillary: 125 mg/dL — ABNORMAL HIGH (ref 70–99)
Glucose-Capillary: 158 mg/dL — ABNORMAL HIGH (ref 70–99)
Glucose-Capillary: 170 mg/dL — ABNORMAL HIGH (ref 70–99)
Glucose-Capillary: 182 mg/dL — ABNORMAL HIGH (ref 70–99)
Glucose-Capillary: 226 mg/dL — ABNORMAL HIGH (ref 70–99)
Glucose-Capillary: 232 mg/dL — ABNORMAL HIGH (ref 70–99)
Glucose-Capillary: 256 mg/dL — ABNORMAL HIGH (ref 70–99)
Glucose-Capillary: 287 mg/dL — ABNORMAL HIGH (ref 70–99)
Glucose-Capillary: 35 mg/dL — CL (ref 70–99)
Glucose-Capillary: 41 mg/dL — CL (ref 70–99)
Glucose-Capillary: 68 mg/dL — ABNORMAL LOW (ref 70–99)

## 2021-12-20 LAB — URINE CULTURE: Culture: >=100000 — AB

## 2021-12-20 LAB — CBC
HCT: 22.3 % — ABNORMAL LOW (ref 36.0–46.0)
Hemoglobin: 7.4 g/dL — ABNORMAL LOW (ref 12.0–15.0)
MCH: 36.8 pg — ABNORMAL HIGH (ref 26.0–34.0)
MCHC: 33.2 g/dL (ref 30.0–36.0)
MCV: 110.9 fL — ABNORMAL HIGH (ref 80.0–100.0)
Platelets: 177 10*3/uL (ref 150–400)
RBC: 2.01 MIL/uL — ABNORMAL LOW (ref 3.87–5.11)
RDW: 15.1 % (ref 11.5–15.5)
WBC: 5.4 10*3/uL (ref 4.0–10.5)
nRBC: 0 % (ref 0.0–0.2)

## 2021-12-20 MED ORDER — DEXTROSE 50 % IV SOLN
1.0000 | Freq: Once | INTRAVENOUS | Status: AC | PRN
  Administered 2021-12-20: 06:00:00 50 mL via INTRAVENOUS
  Filled 2021-12-20: qty 50

## 2021-12-20 MED ORDER — SODIUM CHLORIDE 0.45 % IV SOLN
INTRAVENOUS | Status: DC
Start: 2021-12-20 — End: 2021-12-22

## 2021-12-20 MED ORDER — INSULIN PUMP
SUBCUTANEOUS | Status: DC
  Administered 2021-12-21: 19:00:00 4.25 via SUBCUTANEOUS
  Administered 2021-12-21 (×2): 1.85 via SUBCUTANEOUS
  Administered 2021-12-22: 15:00:00 2.85 via SUBCUTANEOUS
  Administered 2021-12-22: 10:00:00 0.45 via SUBCUTANEOUS
  Filled 2021-12-20: qty 1

## 2021-12-20 MED ORDER — SODIUM CHLORIDE 0.9 % IV SOLN: INTRAVENOUS | Status: DC | PRN

## 2021-12-20 NOTE — Plan of Care (Signed)
?  Problem: Clinical Measurements: ?Goal: Ability to maintain clinical measurements within normal limits will improve ?Outcome: Progressing ?  ?Problem: Activity: ?Goal: Risk for activity intolerance will decrease ?Outcome: Progressing ?  ?Problem: Nutrition: ?Goal: Adequate nutrition will be maintained ?Outcome: Progressing ?  ?Problem: Pain Managment: ?Goal: General experience of comfort will improve ?Outcome: Progressing ?  ?

## 2021-12-20 NOTE — Progress Notes (Signed)
pt report hypoglycemic feeling...fingerstick reads at 35. S/s: shaky, "lousy", mild confusion (pt was very repetitive, at times not making sense). Hypoglycemic protocol started as pt was able to talk to Rn and Nursing tech. Juice and graham crackers with peanut butter was given initially with BS at 35....BS later rechecked: 41.  On call APP notified. Orders placed.  Dextrose IV was given as ordered.  BS recheck 68, at start of dextrose IV. BS rechecked: 256.  During event pt stated that she has had multiple hypoglycemic events before and this was why she came to the hospital.   Also during her stay pt stated that she was given permission from the MD here that she could administer herself insulin from her insulin pump after having FSBS checked. Pt stated when her BS was 191 at the beginning of the PM shift on 12/19/21, she gave herself "0.54ml of insulin" via her pump. (This was done without Rn supervision and confirmation of insulin dose... upon investigation the 0.1ml was a basal rate of 35units). Pt was given DM bedtime snack prior to going to sleep for the night, to which pt stated she did not finish completely.   Pt was further instructed to delay insulin pump (pt says she can only suspend it for 2hours) until further notice and supervision of the RN. Pt informed that her BS willl be checked more frequently throughout the day and to eat small meals throughout the day to help avoid hypoglycemic events. Pt stated that she understood instructions and will wait until Rn is at bedside with further instructions to address her BS.   At shift change pt stated that she feels better now and only complains of pain due to sacral wound. Pain med was given.

## 2021-12-20 NOTE — Progress Notes (Signed)
PROGRESS NOTE    [name]  XTG:[id number] DOB: 1961-02-03 DOA: 12/17/2021 PCP: [name], MD    Brief Narrative:  [name] was admitted to the hospital with the working diagnosis of acute pyelonephritis complicated with acute renal failure    60 yo female with the past medical history of T1DM, sp renal transplant, CKD stage 2, stage IV decubitus ulcer, right BKA, and chronic urinary indwelling foley cathter who presented with nausea. Patient reported 2 days of nausea and vomiting, no chest pain or dyspnea. Because of persistent symptoms she came to the hospital for further evaluation, on her initial physical examination her blood pressure was 96/68, HR 110, temp 97.5, RR 27 ill looking appearing, lungs clear to auscultation, heart with S1 and S2 present and rhythmic, abdomen soft and non tender, right BKA, and large stage IV decubitus pressure ulcer.    Sodium 138, potassium 4.6, chloride 105, bicarb 24, glucose 178, BUN 56, creatinine 1.43. White count 9.4, hemoglobin 11.6, hematocrit 35.9, platelets 222. TSH 13.2. SARS COVID-19 negative.   Urinalysis specific gravity less than 1.005, 100 protein, 21-50 red cells, 21-50 white cells.   CT of the abdomen with urothelial thickening of the transplanted ureter.  Findings concerning for infection.  Foci of gas within the urinary bladder lumen.  Marked diffuse circumferential thickening of the visualized distal esophagus.  Cholelithiasis without cholecystitis.  Deep sacral decubitus ulcer, no abscess formation.   Chest radiograph, no infiltrates, bibasilar atelectasis.   EKG 98 bpm, right ward axis, normal intervals, sinus rhythm with poor R wave progression, Q wave in V3-V4, lead III-aVF, no significant ST segment changes or T wave changes, low voltage.   Patient was placed on antibiotic therapy and supportive IV fluids.  Urine culture positive for Klebsiella oxytoca >100,000 CFU and Proteus Penneri 50,000 (resistant to  ampicillin but sensitive to ceftriaxone   12/24 resumed on insulin pump for glucose control.   Assessment & Plan:   Principal Problem:   Acute kidney injury superimposed on CKD ([place]) Active Problems:   History of renal transplant   Chronic kidney disease (CKD) stage G3a/A1, moderately decreased glomerular filtration rate (GFR) between 45-59 mL/min/1.73 square meter and albuminuria creatinine ratio less than 30 mg/g (HCC)   Type 1 diabetes mellitus with complication, with long term current use of insulin pump (HCC)   Long-term use of immunosuppressant medication   Coffee ground emesis   Sacral decubitus ulcer, stage IV (HCC)   Anemia   Chronic osteomyelitis of sacrum (HCC)   Chronic indwelling Foley catheter   S/P BKA (below knee amputation) unilateral, right (HCC)   DNR (do not resuscitate)/DNI(Do Not Intubate)   AKI (acute kidney injury) ([place])   Acute pyelonephritis of transplanted kidney, complicated with severe sepsis (present on admission) end organ failure hypotension.  Urine infection related to indwelling foley cathter.  Urine culture positive for > 100,000 CFU of Klebsiella oxytoca and 50,00 of Proteus Penneri  Sensitive to ceftriaxone.  Blood cultures continue with no growth, wbc is 5,4 and she has been afebrile.   Continue antibiotic therapy with IV ceftriaxone, continue close follow up of cell count and temperature curve.  Out of bed to chair tid with meals Follow with PT and Ot evaluation   2. T1DM with hyperglycemia hypoglycemia Patient had hypoglycemia and hyperglycemia, she has nausea and poor oral intake. Fasting glucose this am 85.  Plan to continue with insulin pump per her home regimen and will discontinue all sq insulin,. Continue capillary glucose monitoring  3. AKI on CKD stage 2. (Baseline cr is 1,0 not CKD stage 3). Hyperkalemia and non anion gap metabolic acidosis.  Renal function with serum cr down to 1,29 with K at 3,6 and serum bicarbonate at  24.  Urine output 1,700 ml.  Will continue volume repletion with 0,45% saline at 50 ml per hr and will follow up renal function in am. Avoid hypotension and nephrotoxic medications.    Immunosuppressive therapy for renal transplant with imuran, prednisone and cyclosporin.     4. Chronic sacrum stage IV decubitus ulcer. Chronic indwelling foley cathter,.  Left heal stage 2, right buttock stage 2 present on admission  Continue with local wound care, per wound care team recommendations  PT and OT evaluation    5. Acute esophagitis. Moderate calorie protein malnutrition Continue with pantoprazole and sucralfate. Intermittent nausea and poor oral intake, continue with as needed phenergan   Patient continue to be at high risk for  worsening sepsis   Status is: Inpatient  Remains inpatient appropriate because: IV antibiotic therapy   DVT prophylaxis:  Enoxaparin   Code Status:    DNR   Family Communication:   No family at the bedside      Nutrition Status: Nutrition Problem: Moderate Malnutrition Etiology: chronic illness Signs/Symptoms: mild fat depletion, moderate muscle depletion Interventions: Ensure Enlive (each supplement provides 350kcal and 20 grams of protein), MVI     Skin Documentation: Pressure Injury 12/10/20 Heel Left;Posterior Stage 2 -  Partial thickness loss of dermis presenting as a shallow open injury with a red, pink wound bed without slough. (Active)  12/10/20 1325  Location: Heel  Location Orientation: Left;Posterior  Staging: Stage 2 -  Partial thickness loss of dermis presenting as a shallow open injury with a red, pink wound bed without slough.  Wound Description (Comments):   Present on Admission: Yes     Pressure Injury 12/24/20 Sacrum Stage 4 - Full thickness tissue loss with exposed bone, tendon or muscle. (Active)  12/24/20   Location: Sacrum  Location Orientation:   Staging: Stage 4 - Full thickness tissue loss with exposed bone, tendon or  muscle.  Wound Description (Comments):   Present on Admission: Yes     Pressure Injury 02/26/21 Buttocks Right Stage 2 -  Partial thickness loss of dermis presenting as a shallow open injury with a red, pink wound bed without slough. (Active)  02/26/21 0600  Location: Buttocks  Location Orientation: Right  Staging: Stage 2 -  Partial thickness loss of dermis presenting as a shallow open injury with a red, pink wound bed without slough.  Wound Description (Comments):   Present on Admission:      Antimicrobials:  Ceftriaxone     Subjective: Patient is feeling better, last 3rd shift had hypoglycemia, this am with nausea but not abdominal pain or dyspnea, no chest pain. Continue to be very weak and deconditioned not back to her baseline   Objective: Vitals:   12/19/21 1330 12/19/21 2119 12/20/21 0121 12/20/21 0624  BP: (!) 146/77 (!) 151/77 (!) 160/76 (!) 153/70  Pulse: (!) 102 71 68 76  Resp: 16 17 17 18   Temp: 99 F (37.2 C) 98.3 F (36.8 C) 97.8 F (36.6 C) 98.2 F (36.8 C)  TempSrc:  Oral  Oral  SpO2: 99% 99% 97% 96%  Weight:      Height:        Intake/Output Summary (Last 24 hours) at 12/20/2021 0945 Last data filed at 12/20/2021 0850 Gross per 24 hour  Intake 1159.32 ml  Output 1750 ml  Net -590.68 ml   Filed Weights   12/17/21 1611 12/19/21 0500  Weight: 46.7 kg 46.3 kg    Examination:   General: Not in pain or dyspnea, deconditioned  Neurology: Awake and alert, non focal  E ENT: no pallor, no icterus, oral mucosa moist Cardiovascular: No JVD. S1-S2 present, rhythmic, no gallops, rubs, or murmurs. No lower extremity edema. Pulmonary: vesicular breath sounds bilaterally, adequate air movement, no wheezing, rhonchi or rales. Gastrointestinal. Abdomen soft and non tender Skin. Pressure ulcer with dressing in place.  Musculoskeletal: right BKA     Data Reviewed: I have personally reviewed following labs and imaging studies  CBC: Recent Labs  Lab  12/17/21 1719 12/18/21 0356 12/19/21 0321 12/20/21 0313  WBC 9.4 8.8 6.7 5.4  NEUTROABS 7.7 6.8  --   --   HGB 11.6* 9.6* 7.6* 7.4*  HCT 35.9* 30.1* 24.5* 22.3*  MCV 113.6* 114.9* 116.1* 110.9*  PLT 222 196 192 893   Basic Metabolic Panel: Recent Labs  Lab 12/17/21 1719 12/18/21 0356 12/19/21 0321 12/20/21 0313  NA 138 138 138 136  K 4.6 5.3* 4.4 3.6  CL 105 109 111 106  CO2 24 17* 17* 24  GLUCOSE 178* 321* 196* 85  BUN 56* 58* 59* 48*  CREATININE 1.43* 1.49* 1.61* 1.29*  CALCIUM 9.9 9.1 8.7* 8.4*   GFR: Estimated Creatinine Clearance: 33.1 mL/min (A) (by C-G formula based on SCr of 1.29 mg/dL (H)). Liver Function Tests: Recent Labs  Lab 12/17/21 1719 12/18/21 0356  AST 18 15  ALT 10 11  ALKPHOS 94 80  BILITOT 1.0 1.4*  PROT 6.8 5.9*  ALBUMIN 3.3* 2.7*   Recent Labs  Lab 12/17/21 1719  LIPASE 32   No results for input(s): AMMONIA in the last 168 hours. Coagulation Profile: No results for input(s): INR, PROTIME in the last 168 hours. Cardiac Enzymes: No results for input(s): CKTOTAL, CKMB, CKMBINDEX, TROPONINI in the last 168 hours. BNP (last 3 results) No results for input(s): PROBNP in the last 8760 hours. HbA1C: Recent Labs    12/18/21 0356  HGBA1C 6.3*   CBG: Recent Labs  Lab 12/20/21 0621 12/20/21 0633 12/20/21 0732 12/20/21 0837 12/20/21 0938  GLUCAP 68* 256* 226* 182* 158*   Lipid Profile: No results for input(s): CHOL, HDL, LDLCALC, TRIG, CHOLHDL, LDLDIRECT in the last 72 hours. Thyroid Function Tests: Recent Labs    12/17/21 1719  TSH 13.264*   Anemia Panel: No results for input(s): VITAMINB12, FOLATE, FERRITIN, TIBC, IRON, RETICCTPCT in the last 72 hours.    Radiology Studies: I have reviewed all of the imaging during this hospital visit personally     Scheduled Meds:  vitamin C  250 mg Oral BID   azaTHIOprine  50 mg Oral Daily   Chlorhexidine Gluconate Cloth  6 each Topical Daily   cycloSPORINE  75 mg Oral BID    feeding supplement  237 mL Oral BID BM   insulin pump   Subcutaneous Q4H   multivitamin with minerals  1 tablet Oral Daily   pantoprazole  40 mg Oral BID   predniSONE  5 mg Oral Q breakfast   sucralfate  1 g Oral TID WC & HS   Continuous Infusions:  cefTRIAXone (ROCEPHIN)  IV 1 g (12/19/21 1031)    sodium bicarbonate (isotonic) infusion in sterile water 100 mL/hr at 12/20/21 0355     LOS: 2 days        Jimmy Picket  Amita Atayde, MD

## 2021-12-21 DIAGNOSIS — E108 Type 1 diabetes mellitus with unspecified complications: Secondary | ICD-10-CM

## 2021-12-21 DIAGNOSIS — Z9641 Presence of insulin pump (external) (internal): Secondary | ICD-10-CM

## 2021-12-21 DIAGNOSIS — N10 Acute tubulo-interstitial nephritis: Secondary | ICD-10-CM

## 2021-12-21 DIAGNOSIS — N3000 Acute cystitis without hematuria: Secondary | ICD-10-CM

## 2021-12-21 LAB — BASIC METABOLIC PANEL
Anion gap: 7 (ref 5–15)
BUN: 40 mg/dL — ABNORMAL HIGH (ref 8–23)
CO2: 25 mmol/L (ref 22–32)
Calcium: 8.4 mg/dL — ABNORMAL LOW (ref 8.9–10.3)
Chloride: 106 mmol/L (ref 98–111)
Creatinine, Ser: 1.43 mg/dL — ABNORMAL HIGH (ref 0.44–1.00)
GFR, Estimated: 41 mL/min — ABNORMAL LOW (ref >60)
Glucose, Bld: 236 mg/dL — ABNORMAL HIGH (ref 70–99)
Potassium: 4.2 mmol/L (ref 3.5–5.1)
Sodium: 138 mmol/L (ref 135–145)

## 2021-12-21 LAB — GLUCOSE, CAPILLARY
Glucose-Capillary: 225 mg/dL — ABNORMAL HIGH (ref 70–99)
Glucose-Capillary: 186 mg/dL — ABNORMAL HIGH (ref 70–99)
Glucose-Capillary: 155 mg/dL — ABNORMAL HIGH (ref 70–99)
Glucose-Capillary: 206 mg/dL — ABNORMAL HIGH (ref 70–99)
Glucose-Capillary: 268 mg/dL — ABNORMAL HIGH (ref 70–99)

## 2021-12-21 LAB — CBC
HCT: 25.2 % — ABNORMAL LOW (ref 36.0–46.0)
Hemoglobin: 8.2 g/dL — ABNORMAL LOW (ref 12.0–15.0)
MCH: 36 pg — ABNORMAL HIGH (ref 26.0–34.0)
MCHC: 32.5 g/dL (ref 30.0–36.0)
MCV: 110.5 fL — ABNORMAL HIGH (ref 80.0–100.0)
Platelets: 195 10*3/uL (ref 150–400)
RBC: 2.28 MIL/uL — ABNORMAL LOW (ref 3.87–5.11)
RDW: 15 % (ref 11.5–15.5)
WBC: 6 10*3/uL (ref 4.0–10.5)
nRBC: 0 % (ref 0.0–0.2)

## 2021-12-21 MED ORDER — HYDROCODONE BIT-HOMATROP MBR 5-1.5 MG/5ML PO SOLN: 5.0000 mL | Freq: Four times a day (QID) | ORAL | Status: DC | PRN

## 2021-12-21 NOTE — TOC Initial Note (Signed)
Transition of Care [facility]) - Initial/Assessment Note   Patient Details  Name: [name] MRN: [id number] Date of Birth: 30-Jan-1961  [facility] CM/SW Contact:    Sherie Don, LCSW Phone Number: 12/21/2021, 1:21 PM  Clinical Narrative: Readmission checklist completed due to high readmission score. CSW spoke with patient to complete assessment. Per patient, she resides at home with her husband. Patient is somewhat independent with ADLs at baseline. Patient will be fitted for a prosthetic for her right leg on 01/13/22. Current DME includes a wheelchair, rolling walker, rollator, cane, and crutches. Patient will have her husband get her a shower chair. Patient reported she is active with [facility] for [facility] services. Patient's husband is currently providing transportation to medical appointments, but patient is interested in finding local wheelchair transportation to provide respite to her husband.  CSW confirmed with Lattie Haw with [facility] that patient is active with the agency for PT, OT, and RN. TOC to follow.  Expected Discharge Plan: [place] Barriers to Discharge: Continued Medical Work up  Patient Goals and CMS Choice Patient states their goals for this hospitalization and ongoing recovery are:: Discharge home with [facility] through [facility] CMS Medicare.gov Compare Post Acute Care list provided to:: Patient Choice offered to / list presented to : Patient  Expected Discharge Plan and Services Expected Discharge Plan: [place] In-house Referral: Clinical Social Work Living arrangements for the past 2 months: Apartment             DME Arranged: N/A DME Agency: NA HH Arranged: PT, OT, RN [place] Agency: [facility] [place] Date [place]: 12/21/21 Representative spoke with at [place]: Lattie Haw  Prior Living Arrangements/Services Living arrangements for the past 2 months: Apartment Lives with:: Spouse Patient language and need for  interpreter reviewed:: Yes Do you feel safe going back to the place where you live?: Yes      Need for Family Participation in Patient Care: Yes (Comment) Care giver support system in place?: Yes (comment) Current home services: DME, Home OT, Home PT, Home RN (Patient is active with [facility] for [facility]. DME: wheelchair, rolling walker, rollator, cane, crutches.) Criminal Activity/Legal Involvement Pertinent to Current Situation/Hospitalization: No - Comment as needed  Activities of Daily Living Home Assistive Devices/Equipment: Eyeglasses, Insulin Pump, CBG Meter, Wheelchair ADL Screening (condition at time of admission) Patient's cognitive ability adequate to safely complete daily activities?: Yes Is the patient deaf or have difficulty hearing?: No Does the patient have difficulty seeing, even when wearing glasses/contacts?: Yes Does the patient have difficulty concentrating, remembering, or making decisions?: No Patient able to express need for assistance with ADLs?: Yes Does the patient have difficulty dressing or bathing?: Yes Independently performs ADLs?: No Communication: Independent Dressing (OT): Needs assistance Is this a change from baseline?: Pre-admission baseline Grooming: Needs assistance Is this a change from baseline?: Pre-admission baseline Feeding: Independent Bathing: Needs assistance Is this a change from baseline?: Pre-admission baseline Toileting: Needs assistance Is this a change from baseline?: Pre-admission baseline In/Out Bed: Needs assistance Is this a change from baseline?: Pre-admission baseline Walks in Home: Dependent Is this a change from baseline?: Pre-admission baseline Does the patient have difficulty walking or climbing stairs?: Yes Weakness of Legs: Both Weakness of Arms/Hands: None  Permission Sought/Granted Permission sought to share information with : Other (comment) Permission granted to share information with : Yes, Verbal Permission  Granted Permission granted to share info w AGENCY: [facility]  Emotional Assessment Attitude/Demeanor/Rapport: Engaged Affect (typically  observed): Accepting Orientation: : Oriented to Self, Oriented to Place, Oriented to  Time, Oriented to Situation Alcohol / Substance Use: Not Applicable Psych Involvement: No (comment)  Admission diagnosis:  Acute cystitis without hematuria [N30.00] AKI (acute kidney injury) ([place]) [N17.9] History of renal transplant [Z94.0] Hypothyroidism, unspecified type [E03.9] Patient Active Problem List   Diagnosis Date Noted   AKI (acute kidney injury) ([place]) 12/18/2021   Chronic indwelling Foley catheter 12/17/2021   S/P BKA (below knee amputation) unilateral, right ([place]) 12/17/2021   DNR (do not resuscitate)/DNI(Do Not Intubate) 12/17/2021   Chronic osteomyelitis of sacrum ([place]) 08/25/2021   Anemia 02/25/2021   Goals of care, counseling/discussion    Palliative care by specialist    Malnutrition of moderate degree 01/07/2021   Failure to thrive in adult    Unspecified severe protein-calorie malnutrition ([place]) 12/10/2020   Sacral decubitus ulcer, stage IV ([place]) 12/08/2020   Erosive esophagitis 12/08/2020   Hypothyroid 12/08/2020   Depression 12/08/2020   Type 1 diabetes mellitus with hyperlipidemia ([place]) 12/08/2020   Hypoalbuminemia 12/07/2020   Coffee ground emesis    Gastroesophageal reflux disease with esophagitis and hemorrhage    Long-term use of immunosuppressant medication    Type 1 diabetes mellitus with complication, with long term current use of insulin pump ([place]) 09/27/2020   Pressure ulcer of BKA stump ([place]) 09/27/2020   Acute kidney injury superimposed on CKD ([place]) 09/26/2020   Anemia secondary to renal failure 05/14/2020   History of renal transplant 05/14/2020   Chronic kidney disease (CKD) stage G3a/A1, moderately decreased glomerular filtration rate (GFR) between 45-59 mL/min/1.73 square meter and albuminuria creatinine ratio less than  30 mg/g ([place]) 05/14/2020   Hypertension 05/14/2020   PCP:  [name], MD Pharmacy:   [id number] [address], [place] [address] Phone: [phone] Fax: [phone]  Readmission Risk Interventions Readmission Risk Prevention Plan 12/21/2021 03/03/2021 01/23/2021  Transportation Screening Complete Complete Complete  HRI or Home Care Consult Complete - -  Social Work Consult for [place] Planning/Counseling Complete - -  Palliative Care Screening Not Applicable - -  Medication Review Press photographer) Complete Complete Complete  PCP or Specialist appointment within 3-5 days of discharge - Complete -  [name] or [place] - Complete -  SW Recovery Care/Counseling Consult - Complete Complete  Palliative Care Screening - Complete Complete  Skilled Nursing Facility - Not Applicable Complete

## 2021-12-21 NOTE — Plan of Care (Signed)
°  Problem: Education: °Goal: Knowledge of General Education information will improve °Description: Including pain rating scale, medication(s)/side effects and non-pharmacologic comfort measures °Outcome: Progressing °  °Problem: Clinical Measurements: °Goal: Ability to maintain clinical measurements within normal limits will improve °Outcome: Progressing °  °Problem: Coping: °Goal: Level of anxiety will decrease °Outcome: Progressing °  °Problem: Pain Managment: °Goal: General experience of comfort will improve °Outcome: Progressing °  °

## 2021-12-21 NOTE — Progress Notes (Signed)
PROGRESS NOTE    [name]  YCX:[id number] DOB: 09-10-1961 DOA: 12/17/2021 PCP: [name], MD   Chief Complaint  Patient presents with   Nausea    Brief Narrative: [name] was admitted to the hospital with the working diagnosis of acute pyelonephritis complicated with acute renal failure    60 yo female with the past medical history of T1DM, sp renal transplant, CKD stage 2, stage IV decubitus ulcer, right BKA, and chronic urinary indwelling foley cathter who presented with nausea. Patient reported 2 days of nausea and vomiting, no chest pain or dyspnea. Because of persistent symptoms she came to the hospital for further evaluation, on her initial physical examination her blood pressure was 96/68, HR 110, temp 97.5, RR 27 ill looking appearing, lungs clear to auscultation, heart with S1 and S2 present and rhythmic, abdomen soft and non tender, right BKA, and large stage IV decubitus pressure ulcer.    Sodium 138, potassium 4.6, chloride 105, bicarb 24, glucose 178, BUN 56, creatinine 1.43. White count 9.4, hemoglobin 11.6, hematocrit 35.9, platelets 222. TSH 13.2. SARS COVID-19 negative.   Urinalysis specific gravity less than 1.005, 100 protein, 21-50 red cells, 21-50 white cells.   CT of the abdomen with urothelial thickening of the transplanted ureter.  Findings concerning for infection.  Foci of gas within the urinary bladder lumen.  Marked diffuse circumferential thickening of the visualized distal esophagus.  Cholelithiasis without cholecystitis.  Deep sacral decubitus ulcer, no abscess formation.   Chest radiograph, no infiltrates, bibasilar atelectasis.   EKG 98 bpm, right ward axis, normal intervals, sinus rhythm with poor R wave progression, Q wave in V3-V4, lead III-aVF, no significant ST segment changes or T wave changes, low voltage.   Patient was placed on antibiotic therapy and supportive IV fluids.  Urine culture positive for Klebsiella oxytoca  >100,000 CFU and Proteus Penneri 50,000 (resistant to ampicillin but sensitive to ceftriaxone    12/24 resumed on insulin pump for glucose control.      Assessment & Plan:   Principal Problem:   Acute kidney injury superimposed on CKD ([place]) Active Problems:   History of renal transplant   Chronic kidney disease (CKD) stage G3a/A1, moderately decreased glomerular filtration rate (GFR) between 45-59 mL/min/1.73 square meter and albuminuria creatinine ratio less than 30 mg/g (HCC)   Type 1 diabetes mellitus with complication, with long term current use of insulin pump (HCC)   Long-term use of immunosuppressant medication   Coffee ground emesis   Sacral decubitus ulcer, stage IV (HCC)   Anemia   Chronic osteomyelitis of sacrum (HCC)   Chronic indwelling Foley catheter   S/P BKA (below knee amputation) unilateral, right (HCC)   DNR (do not resuscitate)/DNI(Do Not Intubate)   AKI (acute kidney injury) ([place])   #1 acute pyelonephritis of transplanted kidney, complicated severe sepsis, POA with endorgan failure, hypotension -Patient with UTI felt secondary to indwelling Foley catheter. -Urine culture positive for > 100,000 CFU of Klebsiella oxytoca and 50,000 colonies of Proteus penneri. -Continue IV Rocephin and likely transition to oral antibiotics in the next 24 hours to complete a 10 to 14-day course of treatment. -PT/OT.  2.  Type 1 diabetes with hyperglycemia, hypoglycemia -Patient noted to have hypoglycemia and hyperglycemia associated with nausea and poor oral intake. -CBG 186 this morning. -On insulin pump.  3.  AKI on CKD stage II/hyperkalemia/non-anion gap metabolic acidosis/status post transplanted kidney -Baseline creatinine 1.0. -Creatinine at 1.43. -Increase IV fluid to half-normal saline to 75 cc an  hour for the next 24 hours. -Continue cyclosporine, prednisone, Imuran.  4.  Acute esophagitis/protein calorie malnutrition -Noted on CT chest -Continue PPIs,  sucralfate.  5.  Chronic sacrum stage IV decubitus ulcer/chronic indwelling Foley catheter/left heel stage II ulcers/right buttocks stage II, POA -Continue local wound care, as recommended per wound care team. -Patient seen by PT OT recommending home health therapies. Pressure Injury 12/10/20 Heel Left;Posterior Stage 2 -  Partial thickness loss of dermis presenting as a shallow open injury with a red, pink wound bed without slough. (Active)  12/10/20 1325  Location: Heel  Location Orientation: Left;Posterior  Staging: Stage 2 -  Partial thickness loss of dermis presenting as a shallow open injury with a red, pink wound bed without slough.  Wound Description (Comments):   Present on Admission: Yes (upon transfer to 1402)     Pressure Injury 12/24/20 Sacrum Stage 4 - Full thickness tissue loss with exposed bone, tendon or muscle. (Active)  12/24/20   Location: Sacrum  Location Orientation:   Staging: Stage 4 - Full thickness tissue loss with exposed bone, tendon or muscle.  Wound Description (Comments):   Present on Admission: Yes     Pressure Injury 02/26/21 Buttocks Right Stage 2 -  Partial thickness loss of dermis presenting as a shallow open injury with a red, pink wound bed without slough. (Active)  02/26/21 0600  Location: Buttocks  Location Orientation: Right  Staging: Stage 2 -  Partial thickness loss of dermis presenting as a shallow open injury with a red, pink wound bed without slough.  Wound Description (Comments):   Present on Admission:        -   DVT prophylaxis: SCDs Code Status: DNR Family Communication: Updated patient no family at bedside Disposition:   Status is: Inpatient  Remains inpatient appropriate because: Severity of illness       Consultants:  Wound care RN  Procedures:  CT abdomen and pelvis 12/17/2021 Chest x-ray 12/17/2021  Antimicrobials: IV Rocephin 12/17/2021>>>>    Subjective: Sitting up in chair on the telephone with her  mother.  Denies any chest pain.  No shortness of breath.  No nausea or vomiting.  Stated was finally able to eat 3 meals yesterday.  Overall feeling better.  Patient states Carafate helping and asking whether she can be discharged on some Carafate.  Objective: Vitals:   12/20/21 1321 12/20/21 2015 12/21/21 0410 12/21/21 0611  BP: (!) 143/69 (!) 145/91  (!) 172/90  Pulse: 87 82  74  Resp: 18 18  16   Temp: 97.6 F (36.4 C) 98.4 F (36.9 C)  98.6 F (37 C)  TempSrc:  Oral    SpO2: 99% 97%  98%  Weight:   48.7 kg   Height:        Intake/Output Summary (Last 24 hours) at 12/21/2021 1156 Last data filed at 12/21/2021 1146 Gross per 24 hour  Intake 1811.39 ml  Output 2050 ml  Net -238.61 ml   Filed Weights   12/17/21 1611 12/19/21 0500 12/21/21 0410  Weight: 46.7 kg 46.3 kg 48.7 kg    Examination:  General exam: Appears calm and comfortable  Respiratory system: Clear to auscultation. Respiratory effort normal. Cardiovascular system: S1 & S2 heard, RRR. No JVD, murmurs, rubs, gallops or clicks. No pedal edema. Gastrointestinal system: Abdomen is nondistended, soft and nontender. No organomegaly or masses felt. Normal bowel sounds heard. Central nervous system: Alert and oriented. No focal neurological deficits. Extremities: Status post right BKA.  Skin: No rashes,  lesions or ulcers Psychiatry: Judgement and insight appear normal. Mood & affect appropriate.     Data Reviewed: I have personally reviewed following labs and imaging studies  CBC: Recent Labs  Lab 12/17/21 1719 12/18/21 0356 12/19/21 0321 12/20/21 0313 12/21/21 0325  WBC 9.4 8.8 6.7 5.4 6.0  NEUTROABS 7.7 6.8  --   --   --   HGB 11.6* 9.6* 7.6* 7.4* 8.2*  HCT 35.9* 30.1* 24.5* 22.3* 25.2*  MCV 113.6* 114.9* 116.1* 110.9* 110.5*  PLT 222 196 192 177 706    Basic Metabolic Panel: Recent Labs  Lab 12/17/21 1719 12/18/21 0356 12/19/21 0321 12/20/21 0313 12/21/21 0325  NA 138 138 138 136 138  K 4.6  5.3* 4.4 3.6 4.2  CL 105 109 111 106 106  CO2 24 17* 17* 24 25  GLUCOSE 178* 321* 196* 85 236*  BUN 56* 58* 59* 48* 40*  CREATININE 1.43* 1.49* 1.61* 1.29* 1.43*  CALCIUM 9.9 9.1 8.7* 8.4* 8.4*    GFR: Estimated Creatinine Clearance: 31.4 mL/min (A) (by C-G formula based on SCr of 1.43 mg/dL (H)).  Liver Function Tests: Recent Labs  Lab 12/17/21 1719 12/18/21 0356  AST 18 15  ALT 10 11  ALKPHOS 94 80  BILITOT 1.0 1.4*  PROT 6.8 5.9*  ALBUMIN 3.3* 2.7*    CBG: Recent Labs  Lab 12/20/21 2013 12/20/21 2353 12/21/21 0357 12/21/21 0743 12/21/21 1124  GLUCAP 232* 287* 225* 186* 155*     Recent Results (from the past 240 hour(s))  Culture, blood (Routine X 2) w Reflex to ID Panel     Status: None (Preliminary result)   Collection Time: 12/17/21  5:14 PM   Specimen: BLOOD RIGHT FOREARM  Result Value Ref Range Status   Specimen Description   Final    BLOOD RIGHT FOREARM Performed at [facility], [place] [address] [place], [place] [address]    Special Requests   Final    BOTTLES DRAWN AEROBIC AND ANAEROBIC Blood Culture adequate volume Performed at [place] [address], [place], [place] [address]    Culture   Final    NO GROWTH 4 DAYS Performed at [facility], [place] [address] [place], [place] [address]    Report Status PENDING  Incomplete  Culture, blood (Routine X 2) w Reflex to ID Panel     Status: None (Preliminary result)   Collection Time: 12/17/21  5:18 PM   Specimen: BLOOD  Result Value Ref Range Status   Specimen Description   Final    BLOOD LEFT ANTECUBITAL Performed at [place] [address] [place], [place] [address]    Special Requests   Final    BOTTLES DRAWN AEROBIC AND ANAEROBIC Blood Culture adequate volume Performed at [place] [address], [place], [place] [address]    Culture   Final    NO GROWTH 4 DAYS Performed at [place], [place] [address] [place], [place] [address]    Report Status PENDING  Incomplete  Urine Culture     Status: Abnormal   Collection Time: 12/17/21  6:04 PM   Specimen: Urine, Catheterized  Result Value Ref Range Status   Specimen Description   Final    URINE, CATHETERIZED Performed at [place] [address], [place], [place] [address]    Special Requests   Final    NONE Performed at [facility], [place] [address] [place], [place] [address]    Culture (A)  Final    >=100,000 COLONIES/mL KLEBSIELLA OXYTOCA 50,000 COLONIES/mL PROTEUS PENNERI    Report Status 12/20/2021 FINAL  Final   Organism ID, Bacteria KLEBSIELLA OXYTOCA (A)  Final   Organism ID, Bacteria PROTEUS PENNERI (A)  Final      Susceptibility   Klebsiella oxytoca - MIC*    AMPICILLIN >=32 RESISTANT Resistant     CEFAZOLIN 8 SENSITIVE Sensitive     CEFEPIME <=0.12 SENSITIVE Sensitive     CEFTRIAXONE <=0.25 SENSITIVE Sensitive     CIPROFLOXACIN <=0.25 SENSITIVE Sensitive     GENTAMICIN <=1 SENSITIVE Sensitive     IMIPENEM <=0.25 SENSITIVE Sensitive     NITROFURANTOIN 32 SENSITIVE Sensitive     TRIMETH/SULFA <=20 SENSITIVE Sensitive     AMPICILLIN/SULBACTAM 16 INTERMEDIATE Intermediate     PIP/TAZO <=4 SENSITIVE Sensitive     * >=100,000 COLONIES/mL KLEBSIELLA OXYTOCA   Proteus penneri - MIC*    AMPICILLIN >=32 RESISTANT Resistant     CEFAZOLIN >=64 RESISTANT Resistant     CEFEPIME <=0.12 SENSITIVE Sensitive     CEFTRIAXONE <=0.25 SENSITIVE Sensitive     CIPROFLOXACIN <=0.25 SENSITIVE Sensitive     GENTAMICIN <=1 SENSITIVE Sensitive     IMIPENEM 2 SENSITIVE Sensitive     NITROFURANTOIN 128 RESISTANT Resistant     TRIMETH/SULFA <=20 SENSITIVE Sensitive     AMPICILLIN/SULBACTAM 4 SENSITIVE Sensitive     PIP/TAZO <=4 SENSITIVE Sensitive     * 50,000 COLONIES/mL PROTEUS PENNERI  Resp Panel by RT-PCR (Flu A&B, Covid) Urine, Catheterized     Status: None   Collection Time:  12/17/21 11:47 PM   Specimen: Urine, Catheterized; Nasopharyngeal(NP) swabs in vial transport medium  Result Value Ref Range Status   SARS Coronavirus 2 by RT PCR NEGATIVE NEGATIVE Final    Comment: (NOTE) SARS-CoV-2 target nucleic acids are NOT DETECTED.  The SARS-CoV-2 RNA is generally detectable in upper respiratory specimens during the acute phase of infection. The lowest concentration of SARS-CoV-2 viral copies this assay can detect is 138 copies/mL. A negative result does not preclude SARS-Cov-2 infection and should not be used as the sole basis for treatment or other patient management decisions. A negative result may occur with  improper specimen collection/handling, submission of specimen other than nasopharyngeal swab, presence of viral mutation(s) within the areas targeted by this assay, and inadequate number of viral copies(<138 copies/mL). A negative result must be combined with clinical observations, patient history, and epidemiological information. The expected result is Negative.  Fact Sheet for Patients:  EntrepreneurPulse.com.au  Fact Sheet for Healthcare Providers:  IncredibleEmployment.be  This test is no t yet approved or cleared by the [place] FDA and  has been authorized for detection and/or diagnosis of SARS-CoV-2 by FDA under an Emergency Use Authorization (EUA). This EUA will remain  in effect (meaning this test can be used) for the duration of the COVID-19 declaration under Section 564(b)(1) of the Act, 21 U.S.C.section 360bbb-3(b)(1), unless the authorization is terminated  or revoked sooner.       Influenza A by PCR NEGATIVE NEGATIVE Final   Influenza B by PCR NEGATIVE NEGATIVE Final    Comment: (NOTE) The Xpert Xpress SARS-CoV-2/FLU/RSV plus assay is intended as an aid in the diagnosis of influenza from Nasopharyngeal swab specimens and should not be used as a sole basis for treatment. Nasal washings  and aspirates are unacceptable for Xpert Xpress SARS-CoV-2/FLU/RSV testing.  Fact Sheet for Patients: EntrepreneurPulse.com.au  Fact Sheet  for Healthcare Providers: IncredibleEmployment.be  This test is not yet approved or cleared by the [place] and has been authorized for detection and/or diagnosis of SARS-CoV-2 by FDA under an Emergency Use Authorization (EUA). This EUA will remain in effect (meaning this test can be used) for the duration of the COVID-19 declaration under Section 564(b)(1) of the Act, 21 U.S.C. section 360bbb-3(b)(1), unless the authorization is terminated or revoked.  Performed at [facility], [place] [address] [place], [place] [address]          Radiology Studies: No results found.      Scheduled Meds:  vitamin C  250 mg Oral BID   azaTHIOprine  50 mg Oral Daily   Chlorhexidine Gluconate Cloth  6 each Topical Daily   cycloSPORINE  75 mg Oral BID   feeding supplement  237 mL Oral BID BM   insulin pump   Subcutaneous Q4H   multivitamin with minerals  1 tablet Oral Daily   pantoprazole  40 mg Oral BID   predniSONE  5 mg Oral Q breakfast   sucralfate  1 g Oral TID WC & HS   Continuous Infusions:  sodium chloride 75 mL/hr at 12/21/21 0947   sodium chloride 10 mL/hr at 12/20/21 1010   cefTRIAXone (ROCEPHIN)  IV 1 g (12/21/21 0934)     LOS: 3 days    Time spent: 40 minutes    Irine Seal, MD [facility]   To contact the attending provider between 7A-7P or the covering provider during after hours 7P-7A, please log into the web site [email or web address] and access using universal [place] password for that web site. If you do not have the password, please call the hospital operator.  12/21/2021, 11:56 AM

## 2021-12-21 NOTE — Progress Notes (Signed)
Physical Therapy Treatment Patient Details Name: [name] MRN: [id number] DOB: Dec 30, 1960 Today's Date: 12/21/2021   History of Present Illness Patient is a 60 year old female who presented to the hospital with nausea and vomitting. patient was admitted with acute pyelonephritis, acute renal failure. PMH: type I DM, renal transfer, CKD, R BKA, IV decubituce ulcer.    PT Comments    Pt continues very cooperative but progressing slowly 2* R BKA, L LE weakness and presence of significant sacral wound.  Pt assisted with series of lateral scoots to tranfer from drop arm recliner to bedside and into bed for dressing change.   Recommendations for follow up therapy are one component of a multi-disciplinary discharge planning process, led by the attending physician.  Recommendations may be updated based on patient status, additional functional criteria and insurance authorization.  Follow Up Recommendations  Home health PT     Assistance Recommended at Discharge Intermittent Supervision/Assistance  Equipment Recommendations  None recommended by PT    Recommendations for Other Services       Precautions / Restrictions Precautions Precautions: Fall Precaution Comments: R BKA, IV wound on sacral area. Restrictions Weight Bearing Restrictions: No     Mobility  Bed Mobility Overal bed mobility: Needs Assistance Bed Mobility: Rolling;Sit to Sidelying Rolling: Supervision       Sit to sidelying: Min assist General bed mobility comments: assist with LEs onto bed    Transfers Overall transfer level: Needs assistance Equipment used: None Transfers: Bed to chair/wheelchair/BSC            Lateral/Scoot Transfers: Mod assist;Max assist      Ambulation/Gait               General Gait Details: Pt nonambulatory at this time   Stairs             Wheelchair Mobility    Modified Rankin (Stroke Patients Only)       Balance Overall balance assessment:  Needs assistance Sitting-balance support: Feet supported;Single extremity supported Sitting balance-Leahy Scale: Fair       Standing balance-Leahy Scale: Zero                              Cognition Arousal/Alertness: Awake/alert Behavior During Therapy: WFL for tasks assessed/performed Overall Cognitive Status: Within Functional Limits for tasks assessed                                          Exercises      General Comments        Pertinent Vitals/Pain Pain Assessment: 0-10 Pain Score: 4  Pain Location: wound Pain Descriptors / Indicators: Discomfort;Grimacing Pain Intervention(s): Limited activity within patient's tolerance;Monitored during session;Patient requesting pain meds-RN notified    Home Living                          Prior Function            PT Goals (current goals can now be found in the care plan section) Acute Rehab PT Goals Patient Stated Goal: Regain IND PT Goal Formulation: With patient Time For Goal Achievement: 12/19/21 Potential to Achieve Goals: Fair Progress towards PT goals: Progressing toward goals    Frequency    Min 3X/week      PT Plan Current plan remains  appropriate    Co-evaluation              AM-PAC PT "6 Clicks" Mobility   Outcome Measure  Help needed turning from your back to your side while in a flat bed without using bedrails?: A Little Help needed moving from lying on your back to sitting on the side of a flat bed without using bedrails?: A Lot Help needed moving to and from a bed to a chair (including a wheelchair)?: A Lot Help needed standing up from a chair using your arms (e.g., wheelchair or bedside chair)?: Total Help needed to walk in hospital room?: Total Help needed climbing 3-5 steps with a railing? : Total 6 Click Score: 10    End of Session Equipment Utilized During Treatment: Gait belt Activity Tolerance: Patient tolerated treatment well Patient  left: with call bell/phone within reach;in bed;with bed alarm set;with family/visitor present;with nursing/sitter in room Nurse Communication: Mobility status PT Visit Diagnosis: Difficulty in walking, not elsewhere classified (R26.2);Muscle weakness (generalized) (M62.81)     Time: 3888-7579 PT Time Calculation (min) (ACUTE ONLY): 15 min  Charges:  $Therapeutic Activity: 8-22 mins                     [place] Pager [phone] Office [phone]    [facility] 12/21/2021, 4:49 PM

## 2021-12-21 NOTE — Progress Notes (Signed)
Physical Therapy Treatment Patient Details Name: [name] MRN: [id number] DOB: [birth year] Today's Date: 12/21/2021   History of Present Illness Patient is a 60 year old female who presented to the hospital with nausea and vomitting. patient was admitted with acute pyelonephritis, acute renal failure. PMH: type I DM, renal transfer, CKD, R BKA, IV decubituce ulcer.    PT Comments    Pt in good spirits and eager to participate.  Pt performed LE therex program with assisted, moved to EOB sitting and balanced with decreased assist and assisted lateral scoot bed to drop arm recliner.     Recommendations for follow up therapy are one component of a multi-disciplinary discharge planning process, led by the attending physician.  Recommendations may be updated based on patient status, additional functional criteria and insurance authorization.  Follow Up Recommendations  Home health PT     Assistance Recommended at Discharge Intermittent Supervision/Assistance  Equipment Recommendations  None recommended by PT    Recommendations for Other Services       Precautions / Restrictions Precautions Precautions: Fall Restrictions Weight Bearing Restrictions: No     Mobility  Bed Mobility Overal bed mobility: Needs Assistance Bed Mobility: Supine to Sit     Supine to sit: Min assist;Mod assist     General bed mobility comments: INcreased time with use of bedrail.  Physical assist to complete rotation to EOB sitting with bed pad.    Transfers Overall transfer level: Needs assistance Equipment used: None Transfers: Bed to chair/wheelchair/BSC            Lateral/Scoot Transfers: Mod assist;Max assist      Ambulation/Gait               General Gait Details: Pt nonambulatory at this time   Stairs             Wheelchair Mobility    Modified Rankin (Stroke Patients Only)       Balance Overall balance assessment: Needs assistance Sitting-balance  support: Feet supported;Single extremity supported Sitting balance-Leahy Scale: Fair       Standing balance-Leahy Scale: Zero                              Cognition Arousal/Alertness: Awake/alert Behavior During Therapy: WFL for tasks assessed/performed Overall Cognitive Status: Within Functional Limits for tasks assessed                                          Exercises General Exercises - Lower Extremity Ankle Circles/Pumps: AROM;Left;20 reps;Supine Quad Sets: AROM;Both;10 reps;Supine Short Arc Quad: AAROM;Both;10 reps;Supine Heel Slides: AAROM;Both;10 reps;Supine Hip ABduction/ADduction: AAROM;Both;10 reps;Supine    General Comments        Pertinent Vitals/Pain Pain Assessment: 0-10 Pain Score: 4  Pain Location: wound Pain Descriptors / Indicators: Discomfort;Grimacing Pain Intervention(s): Limited activity within patient's tolerance;Monitored during session;Patient requesting pain meds-RN notified    Home Living                          Prior Function            PT Goals (current goals can now be found in the care plan section) Acute Rehab PT Goals Patient Stated Goal: Regain IND PT Goal Formulation: With patient Time For Goal Achievement: 12/19/21 Potential to Achieve Goals: Fair Progress towards  PT goals: Progressing toward goals    Frequency    Min 3X/week      PT Plan Current plan remains appropriate    Co-evaluation              AM-PAC PT "6 Clicks" Mobility   Outcome Measure  Help needed turning from your back to your side while in a flat bed without using bedrails?: A Little Help needed moving from lying on your back to sitting on the side of a flat bed without using bedrails?: A Lot Help needed moving to and from a bed to a chair (including a wheelchair)?: A Lot Help needed standing up from a chair using your arms (e.g., wheelchair or bedside chair)?: Total Help needed to walk in hospital  room?: Total Help needed climbing 3-5 steps with a railing? : Total 6 Click Score: 10    End of Session Equipment Utilized During Treatment: Gait belt Activity Tolerance: Patient tolerated treatment well Patient left: in chair;with call bell/phone within reach;with chair alarm set Nurse Communication: Mobility status PT Visit Diagnosis: Difficulty in walking, not elsewhere classified (R26.2);Muscle weakness (generalized) (M62.81)     Time: 1829-9371 PT Time Calculation (min) (ACUTE ONLY): 36 min  Charges:  $Therapeutic Exercise: 8-22 mins $Therapeutic Activity: 8-22 mins                     Debe Coder PT Acute Rehabilitation Services Pager [phone] Office [phone]    [name] 12/21/2021, 10:21 AM

## 2021-12-22 DIAGNOSIS — N1831 Chronic kidney disease, stage 3a: Secondary | ICD-10-CM | POA: Diagnosis not present

## 2021-12-22 DIAGNOSIS — D649 Anemia, unspecified: Secondary | ICD-10-CM | POA: Diagnosis not present

## 2021-12-22 DIAGNOSIS — N179 Acute kidney failure, unspecified: Secondary | ICD-10-CM | POA: Diagnosis not present

## 2021-12-22 DIAGNOSIS — N10 Acute tubulo-interstitial nephritis: Secondary | ICD-10-CM | POA: Diagnosis not present

## 2021-12-22 LAB — CBC WITH DIFFERENTIAL/PLATELET
Abs Immature Granulocytes: 0.03 10*3/uL (ref 0.00–0.07)
Basophils Absolute: 0 10*3/uL (ref 0.0–0.1)
Basophils Relative: 0 %
Eosinophils Absolute: 0.2 10*3/uL (ref 0.0–0.5)
Eosinophils Relative: 4 %
HCT: 24.5 % — ABNORMAL LOW (ref 36.0–46.0)
Hemoglobin: 8 g/dL — ABNORMAL LOW (ref 12.0–15.0)
Immature Granulocytes: 1 %
Lymphocytes Relative: 30 %
Lymphs Abs: 1.6 10*3/uL (ref 0.7–4.0)
MCH: 36.2 pg — ABNORMAL HIGH (ref 26.0–34.0)
MCHC: 32.7 g/dL (ref 30.0–36.0)
MCV: 110.9 fL — ABNORMAL HIGH (ref 80.0–100.0)
Monocytes Absolute: 0.4 10*3/uL (ref 0.1–1.0)
Monocytes Relative: 7 %
Neutro Abs: 3.1 10*3/uL (ref 1.7–7.7)
Neutrophils Relative %: 58 %
Platelets: 193 10*3/uL (ref 150–400)
RBC: 2.21 MIL/uL — ABNORMAL LOW (ref 3.87–5.11)
RDW: 14.6 % (ref 11.5–15.5)
WBC: 5.2 10*3/uL (ref 4.0–10.5)
nRBC: 0 % (ref 0.0–0.2)

## 2021-12-22 LAB — GLUCOSE, CAPILLARY
Glucose-Capillary: 355 mg/dL — ABNORMAL HIGH (ref 70–99)
Glucose-Capillary: 235 mg/dL — ABNORMAL HIGH (ref 70–99)
Glucose-Capillary: 214 mg/dL — ABNORMAL HIGH (ref 70–99)

## 2021-12-22 LAB — BASIC METABOLIC PANEL
Anion gap: 7 (ref 5–15)
BUN: 38 mg/dL — ABNORMAL HIGH (ref 8–23)
CO2: 23 mmol/L (ref 22–32)
Calcium: 8.2 mg/dL — ABNORMAL LOW (ref 8.9–10.3)
Chloride: 104 mmol/L (ref 98–111)
Creatinine, Ser: 1.15 mg/dL — ABNORMAL HIGH (ref 0.44–1.00)
GFR, Estimated: 54 mL/min — ABNORMAL LOW (ref >60)
Glucose, Bld: 352 mg/dL — ABNORMAL HIGH (ref 70–99)
Potassium: 4.5 mmol/L (ref 3.5–5.1)
Sodium: 134 mmol/L — ABNORMAL LOW (ref 135–145)

## 2021-12-22 LAB — CULTURE, BLOOD (ROUTINE X 2)
Culture: NO GROWTH
Culture: NO GROWTH
Special Requests: ADEQUATE
Special Requests: ADEQUATE

## 2021-12-22 MED ORDER — SUCRALFATE 1 GM/10ML PO SUSP: 1.0000 g | Freq: Three times a day (TID) | ORAL | 1 refills | Status: DC

## 2021-12-22 MED ORDER — CEFDINIR 300 MG PO CAPS: 300.0000 mg | ORAL_CAPSULE | Freq: Two times a day (BID) | ORAL | 0 refills | Status: AC

## 2021-12-22 MED ORDER — HYDROCODONE BIT-HOMATROP MBR 5-1.5 MG/5ML PO SOLN: 5.0000 mL | Freq: Four times a day (QID) | ORAL | 0 refills | Status: DC | PRN

## 2021-12-22 MED ORDER — CEFDINIR 300 MG PO CAPS: 300.0000 mg | ORAL_CAPSULE | Freq: Two times a day (BID) | ORAL | Status: DC

## 2021-12-22 MED ORDER — PANTOPRAZOLE SODIUM 40 MG PO TBEC: 40.0000 mg | DELAYED_RELEASE_TABLET | Freq: Two times a day (BID) | ORAL | 1 refills | Status: DC

## 2021-12-22 MED ORDER — ASCORBIC ACID 250 MG PO TABS: 250.0000 mg | ORAL_TABLET | Freq: Two times a day (BID) | ORAL | Status: DC

## 2021-12-22 NOTE — Progress Notes (Signed)
Occupational Therapy Treatment Patient Details Name: [name] MRN: [id number] DOB: Aug 16, 1961 Today's Date: 12/22/2021   History of present illness Patient is a 60 year old female who presented to the hospital with nausea and vomitting. patient was admitted with acute pyelonephritis, acute renal failure. PMH: type I DM, renal transfer, CKD, R BKA, IV decubituce ulcer.   OT comments  Patient was noted to make improvements on sitting tolerance on edge of bed to participate in washing up this AM. Patient was able to correct balance when posterior leaning was noted sitting EOB with cues. Patient was max A for scoot transfer from edge of bed to recliner in room with increased time and cues for proper hand and foot placement. Patient still recommended to have 24/7 caregiver support in next level of care. Patient would continue to benefit from skilled OT services at this time while admitted and after d/c to address noted deficits in order to improve overall safety and independence in ADLs.     Recommendations for follow up therapy are one component of a multi-disciplinary discharge planning process, led by the attending physician.  Recommendations may be updated based on patient status, additional functional criteria and insurance authorization.    Follow Up Recommendations  Home health OT    Assistance Recommended at Discharge Frequent or constant Supervision/Assistance  Equipment Recommendations  Other (comment) (has all needed items)    Recommendations for Other Services      Precautions / Restrictions Precautions Precautions: Fall Precaution Comments: R BKA, IV wound on sacral area. Restrictions Weight Bearing Restrictions: No       Mobility Bed Mobility Overal bed mobility: Needs Assistance       Supine to sit: Min assist     General bed mobility comments: with increased time    Transfers     Transfers: Sit to/from Stand             General transfer  comment: repeated partial sit<>stands x 10 for strengthening; able to come to partial stand with min-mod +2     Balance Overall balance assessment: Needs assistance Sitting-balance support: Feet supported;No upper extremity supported Sitting balance-Leahy Scale: Good Sitting balance - Comments: able to work on lateral wt shifting, scooting in chair with and without UE support     Standing balance-Leahy Scale: Zero                             ADL either performed or assessed with clinical judgement   ADL Overall ADL's : Needs assistance/impaired     Grooming: Oral care;Wash/dry face;Set up;Sitting Grooming Details (indicate cue type and reason): with patietn noted to lean posteriorly when fatigued but able to self correct when provided cues to sit up tall. Upper Body Bathing: Minimal assistance;Sitting Upper Body Bathing Details (indicate cue type and reason): on EOB Lower Body Bathing: Sitting/lateral leans;Maximal assistance Lower Body Bathing Details (indicate cue type and reason): able to wipe top of thighs sitting on edge of bed. Upper Body Dressing : Minimal assistance;Sitting Upper Body Dressing Details (indicate cue type and reason): EOB     Toilet Transfer: Maximal assistance;Cueing for sequencing;Cueing for safety Toilet Transfer Details (indicate cue type and reason): scoot trasnfer from edge of bed to recliner in room to simulate transfer. patient needed continued cues for proper hand and foot placement.                Extremity/Trunk Assessment  Vision       Perception     Praxis      Cognition Arousal/Alertness: Awake/alert Behavior During Therapy: WFL for tasks assessed/performed Overall Cognitive Status: Within Functional Limits for tasks assessed                                            Exercises General Exercises - Lower Extremity Ankle Circles/Pumps: AROM;Left;10 reps Quad Sets: AROM;Both;10  reps;Strengthening Gluteal Sets: AROM;Strengthening;Both;10 reps Short Arc Quad: AAROM;10 reps;Right Long Arc Quad: AROM;AAROM;Both;10 reps;Seated Hip ABduction/ADduction: AAROM;Both;10 reps Hip Flexion/Marching: AROM;Right;5 reps;Seated   Shoulder Instructions       General Comments      Pertinent Vitals/ Pain       Pain Assessment: Faces Pain Score: 2  Faces Pain Scale: Hurts little more Pain Location: wound Pain Descriptors / Indicators: Discomfort;Grimacing Pain Intervention(s): Limited activity within patient's tolerance;Monitored during session;Repositioned  Home Living                                          Prior Functioning/Environment              Frequency  Min 2X/week        Progress Toward Goals  OT Goals(current goals can now be found in the care plan section)  Progress towards OT goals: Progressing toward goals     Plan Discharge plan remains appropriate    Co-evaluation                 AM-PAC OT "6 Clicks" Daily Activity     Outcome Measure   Help from another person eating meals?: A Little Help from another person taking care of personal grooming?: A Little Help from another person toileting, which includes using toliet, bedpan, or urinal?: A Lot Help from another person bathing (including washing, rinsing, drying)?: A Lot Help from another person to put on and taking off regular upper body clothing?: A Little Help from another person to put on and taking off regular lower body clothing?: A Lot 6 Click Score: 15    End of Session Equipment Utilized During Treatment: Gait belt  OT Visit Diagnosis: Unsteadiness on feet (R26.81);Muscle weakness (generalized) (M62.81)   Activity Tolerance Patient tolerated treatment well   Patient Left in chair;with call bell/phone within reach;with chair alarm set   Nurse Communication Mobility status        Time: 1219-7588 OT Time Calculation (min): 24 min  Charges:  OT General Charges $OT Visit: 1 Visit OT Treatments $Self Care/Home Management : 23-37 mins  Jackelyn Poling OTR/L, MS Acute Rehabilitation Department Office# [phone] Pager# [phone]   Marcellina Millin 12/22/2021, 1:00 PM

## 2021-12-22 NOTE — TOC Transition Note (Signed)
Transition of Care [facility]) - CM/SW Discharge Note   Patient Details  Name: [name] MRN: [id number] Date of Birth: 1961/12/17  Transition of Care [facility] CM/SW Contact:  Lennart Pall, LCSW Phone Number: 12/22/2021, 3:18 PM   Clinical Narrative:    Pt medically cleared for dc home today.  Have alerted [facility] who are providing her RN,PT,OT services.  No further TOC needs.   Final next level of care: [place] Barriers to Discharge: Barriers Resolved   Patient Goals and CMS Choice Patient states their goals for this hospitalization and ongoing recovery are:: Discharge home with [facility] through [facility] CMS Medicare.gov Compare Post Acute Care list provided to:: Patient Choice offered to / list presented to : Patient  Discharge Placement                       Discharge Plan and Services In-house Referral: Clinical Social Work              DME Arranged: N/A DME Agency: NA       HH Arranged: PT, OT, RN [place] Agency: [facility] Date [place] Agency Contacted: 12/21/21   Representative spoke with at [place]: [place] Interventions     Readmission Risk Interventions Readmission Risk Prevention Plan 12/21/2021 03/03/2021 01/23/2021  Transportation Screening Complete Complete Complete  HRI or Home Care Consult Complete - -  Social Work Consult for [place] Planning/Counseling Complete - -  Palliative Care Screening Not Applicable - -  Medication Review Press photographer) Complete Complete Complete  PCP or Specialist appointment within 3-5 days of discharge - Complete -  [place] or [place] - Complete -  SW Recovery Care/Counseling Consult - Complete Complete  Palliative Care Screening - Complete Complete  [place] - Not Applicable Complete

## 2021-12-22 NOTE — Plan of Care (Signed)
°  Problem: Health Behavior/Discharge Planning: Goal: Ability to manage health-related needs will improve Outcome: Progressing   Problem: Activity: Goal: Risk for activity intolerance will decrease Outcome: Progressing   Problem: Pain Managment: Goal: General experience of comfort will improve Outcome: [place], RN 12/22/21 12:18 PM

## 2021-12-22 NOTE — Discharge Summary (Signed)
Physician Discharge Summary  [name] WJX:[id number] DOB: May 17, 1961 DOA: 12/17/2021  PCP: Doris Croak, MD  Admit date: 12/17/2021 Discharge date: 12/22/2021  Time spent: 60 minutes  Recommendations for Outpatient Follow-up:  Follow-up with Doris Croak, MD in 2 weeks.  On follow-up patient will need a basic metabolic profile done to follow-up on electrolytes and renal function.  CBC will need to be done to follow-up on H&H.  Patient's diabetes will need to be followed up upon.  Patient's esophagitis will also need to be followed up upon.  Patient was discharged with home health therapies.   Discharge Diagnoses:  Principal Problem:   Acute kidney injury superimposed on CKD ([place]) Active Problems:   History of renal transplant   Chronic kidney disease (CKD) stage G3a/A1, moderately decreased glomerular filtration rate (GFR) between 45-59 mL/min/1.73 square meter and albuminuria creatinine ratio less than 30 mg/g (HCC)   Type 1 diabetes mellitus with complication, with long term current use of insulin pump (HCC)   Long-term use of immunosuppressant medication   Coffee ground emesis   Sacral decubitus ulcer, stage IV (HCC)   Anemia   Chronic osteomyelitis of sacrum (HCC)   Chronic indwelling Foley catheter   S/P BKA (below knee amputation) unilateral, right (HCC)   DNR (do not resuscitate)/DNI(Do Not Intubate)   AKI (acute kidney injury) ([place])   Acute cystitis without hematuria   Acute pyelonephritis   Discharge Condition: Stable and improved  Diet recommendation: Carb modified diet  Filed Weights   12/17/21 1611 12/19/21 0500 12/21/21 0410  Weight: 46.7 kg 46.3 kg 48.7 kg    History of present illness:  HPI per Dr. Bridgett Lopez 60 year old female with a history of type 1 diabetes, renal transplant, CKD stage III, history of esophagitis, history of a chronic stage IV decubitus ulcer, right below-knee amputation, chronic Foley catheter presents to the ER today with  a 2-day history of nausea, intermittent dry heaving.  Patient denies any fevers.  No upper respiratory symptoms.   Patient has a greater than 40-year history of diabetes type 1.  She has been on insulin for many years.  She has an insulin pump.  He had a DKA of her right lower extremity about a year ago by Dr. Sharol Given.  She states that she is going to be going to the prosthesis clinic in January 2023 refitted for a right lower extremity prosthesis.   SHe states that she developed started feeling nauseated.  sHe attempted to vomit several times but is only had dry heaves.   Denies any fevers.   Patient has a chronic Foley catheter that is changed out on a monthly basis.  Patient is due for Foley catheter change in 2 to 3 days.  Current catheter has been in place for more than 3-1/2-week.   Patient presented to the ER today for evaluation.   On arrival, patient is afebrile temp 97.5 heart rate 110 blood pressure 96/68 satting high percent on room air.   Laboratory evaluation CBC white count 9.4, hemoglobin 11.6, platelets 222   Sodium 138, BUN of 56, creatinine 1.43.  Baseline creatinine approximately 1.09-1.2   TSH was elevated at 13.2   Unfortunately a urinalysis was obtained from her catheter that been in place for the last 3 weeks.  Repeat UA from a newly changed out catheter still pending.   Due to elevated serum creatinine, [facility] hospitalist contacted for admission.  Nephrology has been notified the patient admission.    ED Course: UA obtained  from chronic Foley in place for 3 weeks.  Chemistry showed mild AKI.  CT abdomen.  Circumferential thickening of the distal esophagus concerning for esophagitis.   Urothelial thickening of transplanted of the transplanted ureter.  Hospital Course:  #1 acute pyelonephritis of transplanted kidney, complicated severe sepsis, POA with endorgan failure, hypotension -Patient with UTI felt secondary to indwelling Foley catheter. -Urine culture  positive for > 100,000 CFU of Klebsiella oxytoca and 50,000 colonies of Proteus penneri. -Patient maintained on IV Rocephin and will be transition to oral Omnicef for 9 more days to complete a 14-day course of antibiotic treatment.  -Outpatient follow-up with PCP.   2.  Type 1 diabetes with hyperglycemia, hypoglycemia -Patient noted to have hypoglycemia and hyperglycemia associated with nausea and poor oral intake. -Patient placed back on insulin pump with improvement with CBGs.   -Outpatient follow-up with PCP and endocrinologist.  3.  AKI on CKD stage II/hyperkalemia/non-anion gap metabolic acidosis/status post transplanted kidney -Baseline creatinine 1.0. -Patient hydrated with IV fluids with improvement with renal function such that by day of discharge creatinine was down to 1.15. -Patient maintained on home regimen cyclosporine, prednisone, Imuran. -Outpatient follow-up with primary nephrologist as previously scheduled.  4.  Acute esophagitis/protein calorie malnutrition -Noted on CT chest -Patient placed on PPI twice a day as well as sucralfate.   -Patient was discharged home on PPI twice daily x1 month and then daily as well as sucralfate 4 times daily.   -Outpatient follow-up with PCP.   5.  Chronic sacrum stage IV decubitus ulcer/chronic indwelling Foley catheter/left heel stage II ulcers/right buttocks stage II, POA -Continue local wound care, as recommended per wound care team. -Patient seen by PT OT recommending home health therapies. Pressure Injury 12/10/20 Heel Left;Posterior Stage 2 -  Partial thickness loss of dermis presenting as a shallow open injury with a red, pink wound bed without slough. (Active)  12/10/20 1325  Location: Heel  Location Orientation: Left;Posterior  Staging: Stage 2 -  Partial thickness loss of dermis presenting as a shallow open injury with a red, pink wound bed without slough.  Wound Description (Comments):   Present on Admission: Yes (upon  transfer to 1402)     Pressure Injury 12/24/20 Sacrum Stage 4 - Full thickness tissue loss with exposed bone, tendon or muscle. (Active)  12/24/20   Location: Sacrum  Location Orientation:   Staging: Stage 4 - Full thickness tissue loss with exposed bone, tendon or muscle.  Wound Description (Comments):   Present on Admission: Yes     Pressure Injury 02/26/21 Buttocks Right Stage 2 -  Partial thickness loss of dermis presenting as a shallow open injury with a red, pink wound bed without slough. (Active)  02/26/21 0600  Location: Buttocks  Location Orientation: Right  Staging: Stage 2 -  Partial thickness loss of dermis presenting as a shallow open injury with a red, pink wound bed without slough.  Wound Description (Comments):   Present on Admission:      Procedures: CT abdomen and pelvis 12/17/2021 Chest x-ray 12/17/2021  Consultations: Wound care RN  Discharge Exam: Vitals:   12/22/21 0427 12/22/21 1413  BP: (!) 166/79 (!) 141/85  Pulse: 67 99  Resp: 16 16  Temp: 98.5 F (36.9 C) 98.2 F (36.8 C)  SpO2: 96% 100%    General: NAD Cardiovascular: Regular rate rhythm no murmurs rubs or gallops.  No JVD.  No lower extremity edema. Respiratory: Clear to auscultation bilaterally.  No wheezes, no crackles, no rhonchi.  Discharge Instructions   Discharge Instructions     Diet Carb Modified   Complete by: As directed    Discharge wound care:   Complete by: As directed    As above.   Increase activity slowly   Complete by: As directed       Allergies as of 12/22/2021   No Known Allergies      Medication List     STOP taking these medications    carbamide peroxide 6.5 % OTIC solution Commonly known as: DEBROX   ciprofloxacin 500 MG tablet Commonly known as: CIPRO   cycloSPORINE 25 MG capsule Commonly known as: SANDIMMUNE   insulin glargine 100 UNIT/ML injection Commonly known as: LANTUS       TAKE these medications    acetaminophen 325 MG  tablet Commonly known as: TYLENOL Take 2 tablets (650 mg total) by mouth every 6 (six) hours as needed for mild pain (or Fever >/= 101). What changed: reasons to take this   ascorbic acid 250 MG tablet Commonly known as: VITAMIN C Take 1 tablet (250 mg total) by mouth 2 (two) times daily.   aspirin EC 81 MG tablet Take 81 mg by mouth daily. Swallow whole.   atorvastatin 10 MG tablet Commonly known as: LIPITOR Take 1 tablet (10 mg total) by mouth daily.   azaTHIOprine 50 MG tablet Commonly known as: IMURAN Take 1 tablet (50 mg total) by mouth daily.   B Complex-C-Folic Acid Tabs Take 1 tablet by mouth daily.   cefdinir 300 MG capsule Commonly known as: OMNICEF Take 1 capsule (300 mg total) by mouth every 12 (twelve) hours for 9 days. Start taking on: December 23, 2021   cholestyramine 4 g packet Commonly known as: QUESTRAN Take 1 packet (4 g total) by mouth 2 (two) times daily.   collagenase ointment Commonly known as: SANTYL Apply topically daily. What changed: how much to take   cycloSPORINE modified 25 MG capsule Commonly known as: NEORAL Take 75 mg by mouth 2 (two) times daily.   HYDROcodone bit-homatropine 5-1.5 MG/5ML syrup Commonly known as: HYCODAN Take 5 mLs by mouth every 6 (six) hours as needed for cough.   insulin lispro 100 UNIT/ML injection Commonly known as: HUMALOG Inject 0.02 mLs (2 Units total) into the skin 3 (three) times daily before meals. Sliding scale if 200-250=2 units;251-300=4units;301-350=6 units;351-400=8 units;104-450=10 units;451-500=12 units subcutaneously before meals and at bedtime for DM What changed:  how much to take additional instructions   ketoconazole 2 % cream Commonly known as: NIZORAL Apply 1 application topically daily.   levothyroxine 88 MCG tablet Commonly known as: SYNTHROID Take 1 tablet (88 mcg total) by mouth daily before breakfast.   lisinopril 5 MG tablet Commonly known as: ZESTRIL Take 5 mg by mouth  daily.   liver oil-zinc oxide 40 % ointment Commonly known as: DESITIN Apply topically as needed for irritation. What changed:  how much to take when to take this   loperamide 2 MG capsule Commonly known as: IMODIUM Take 1 capsule (2 mg total) by mouth as needed for diarrhea or loose stools. What changed: when to take this   ondansetron 4 MG disintegrating tablet Commonly known as: ZOFRAN-ODT Take 1 tablet (4 mg total) by mouth every 4 (four) hours as needed for nausea or vomiting.   oxyCODONE 5 MG immediate release tablet Commonly known as: Oxy IR/ROXICODONE Take 1 tablet (5 mg total) by mouth every 6 (six) hours as needed for severe pain.   pantoprazole 40  MG tablet Commonly known as: PROTONIX Take 1 tablet (40 mg total) by mouth 2 (two) times daily. Take 1 tablet twice daily x1 month, then 1 tablet daily thereafter. What changed:  when to take this additional instructions   predniSONE 5 MG tablet Commonly known as: DELTASONE Take 1 tablet (5 mg total) by mouth daily with breakfast.   saccharomyces boulardii 250 MG capsule Commonly known as: FLORASTOR Take 1 capsule (250 mg total) by mouth 2 (two) times daily.   sertraline 50 MG tablet Commonly known as: ZOLOFT Take 50 mg by mouth daily. What changed: Another medication with the same name was removed. Continue taking this medication, and follow the directions you see here.   sodium hypochlorite external solution Commonly known as: DAKIN'S 1/2 STRENGTH Apply 1 application topically daily.   sucralfate 1 GM/10ML suspension Commonly known as: CARAFATE Take 10 mLs (1 g total) by mouth 4 (four) times daily -  with meals and at bedtime.   triamcinolone 55 MCG/ACT Aero nasal inhaler Commonly known as: NASACORT USE 1 SPRAY IN [facility] NOSTRIL               Discharge Care Instructions  (From admission, onward)           Start     Ordered   12/22/21 0000  Discharge wound care:       Comments: As above.    12/22/21 1504           No Known Allergies  Follow-up Information     [facility] Follow up.   Why: PT, OT, and RN        Doris Croak, MD. Schedule an appointment as soon as possible for a visit in 2 week(s).   Specialty: Family Medicine Contact information: [address] [place] [address] [phone]                  The results of significant diagnostics from this hospitalization (including imaging, microbiology, ancillary and laboratory) are listed below for reference.    Significant Diagnostic Studies: CT Abdomen Pelvis W Contrast  Result Date: 12/17/2021 CLINICAL DATA:  Nausea/vomiting EXAM: CT ABDOMEN AND PELVIS WITH CONTRAST TECHNIQUE: Multidetector CT imaging of the abdomen and pelvis was performed using the standard protocol following bolus administration of intravenous contrast. CONTRAST:  77mL OMNIPAQUE IOHEXOL 350 MG/ML SOLN COMPARISON:  Ct abd/pelvis 02/25/21 FINDINGS: Lower chest: No acute abnormality. Marked diffuse circumferential thickening of the visualized distal esophagus with associated hyperenhancement of the mucosa. Hepatobiliary: No focal liver abnormality. Calcified gallstone noted within the gallbladder lumen. No gallbladder wall thickening or pericholecystic fluid. No biliary dilatation. Pancreas: No focal lesion. Normal pancreatic contour. No surrounding inflammatory changes. No main pancreatic ductal dilatation. Spleen: Normal in size without focal abnormality. Adrenals/Urinary Tract: No adrenal nodule bilaterally. Marked atrophy and scarring of bilateral native kidneys. Left iliac fossa renal transplant. Associated subcentimeter hypodensities are too small to characterize. No hydroureteronephrosis. Associated urothelial thickening of the transplanted ureter. The transplant kidney enhances homogeneously. No hydronephrosis. No hydroureter. The urinary bladder lumen is mildly distended with urine. Foci of gas  noted within the urinary bladder lumen. Temperature probe noted terminating within the urinary bladder lumen. Stomach/Bowel: Stomach is within normal limits. No evidence of bowel wall thickening or dilatation. Stool throughout the colon. Appendix appears normal. 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 No abdominal aorta aneurysm. Mild atherosclerotic plaque of the aorta and its branches. No abdominal,  pelvic, or inguinal lymphadenopathy. Reproductive: Poorly visualized pelvis due to streak artifact originating from the right femoral surgical hardware. Other: No intraperitoneal free fluid. No intraperitoneal free gas. No organized fluid collection. Musculoskeletal: Redemonstration of a deep sacral decubitus ulcer. No abscess formation. No cortical erosion or destruction. No suspicious lytic or blastic osseous lesions. No acute displaced fracture. Multilevel degenerative changes of the spine. IMPRESSION: 1. Urothelial thickening of the transplanted ureter. Findings concerning for infection. Foci of gas within the urinary bladder lumen likely related to temperature probe instrumentation. Differential diagnosis includes cystitis. Correlate with urinalysis. 2. Marked diffuse circumferential thickening of the visualized distal esophagus with associated hyperenhancement of the mucosa. Correlate with signs and symptoms of esophagitis. Underlying malignancy not excluded. 3. Cholelithiasis with no CT findings of acute cholecystitis. 4. Deep sacral decubitus ulcer. No abscess formation. 5.  Aortic Atherosclerosis (ICD10-I70.0). 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 Electronically Signed   By: Iven Finn M.D.   On: 12/17/2021 19:22   DG Chest Port 1 View  Result Date: 12/17/2021 CLINICAL DATA:  Nausea the started last night and all day today. EXAM: PORTABLE CHEST 1 VIEW COMPARISON:  02/25/2021 FINDINGS: Mild bibasilar atelectasis. No focal consolidation. No  pleural effusion or pneumothorax. Heart and mediastinal contours are unremarkable. No acute osseous abnormality. Old right anterior rib fractures. Osteoarthritis of bilateral glenohumeral joints. IMPRESSION: No active disease. Electronically Signed   By: Kathreen Devoid M.D.   On: 12/17/2021 17:10    Microbiology: Recent Results (from the past 240 hour(s))  Culture, blood (Routine X 2) w Reflex to ID Panel     Status: None   Collection Time: 12/17/21  5:14 PM   Specimen: BLOOD RIGHT FOREARM  Result Value Ref Range Status   Specimen Description   Final    BLOOD RIGHT FOREARM Performed at [facility] [address] [place], [place] [address]    Special Requests   Final    BOTTLES DRAWN AEROBIC AND ANAEROBIC Blood Culture adequate volume Performed at [facility] [address], [place], [place] [address]    Culture   Final    NO GROWTH 5 DAYS Performed at [facility], [place] [address] [place], [place] [address]    Report Status 12/22/2021 FINAL  Final  Culture, blood (Routine X 2) w Reflex to ID Panel     Status: None   Collection Time: 12/17/21  5:18 PM   Specimen: BLOOD  Result Value Ref Range Status   Specimen Description   Final    BLOOD LEFT ANTECUBITAL Performed at [facility] [address], [place], [place] [address]    Special Requests   Final    BOTTLES DRAWN AEROBIC AND ANAEROBIC Blood Culture adequate volume Performed at [facility] [address] [place], [place] [address]    Culture   Final    NO GROWTH 5 DAYS Performed at [facility], [place] [address] [place], [place] [address]    Report Status 12/22/2021 FINAL  Final  Urine Culture     Status: Abnormal   Collection Time: 12/17/21  6:04 PM   Specimen: Urine, Catheterized  Result Value Ref Range Status   Specimen Description   Final    URINE, CATHETERIZED Performed at [facility] [address], [place], [place] [address]    Special Requests   Final    NONE Performed at [facility], [place]  [address] [place], [place] [address]    Culture (A)  Final    >=100,000 COLONIES/mL KLEBSIELLA OXYTOCA 50,000 COLONIES/mL PROTEUS PENNERI    Report Status 12/20/2021 FINAL  Final   Organism ID, Bacteria KLEBSIELLA OXYTOCA (A)  Final   Organism ID, Bacteria PROTEUS PENNERI (A)  Final      Susceptibility   Klebsiella oxytoca - MIC*    AMPICILLIN >=32 RESISTANT Resistant     CEFAZOLIN 8 SENSITIVE Sensitive     CEFEPIME <=0.12 SENSITIVE Sensitive     CEFTRIAXONE <=0.25 SENSITIVE Sensitive     CIPROFLOXACIN <=0.25 SENSITIVE Sensitive     GENTAMICIN <=1 SENSITIVE Sensitive     IMIPENEM <=0.25 SENSITIVE Sensitive     NITROFURANTOIN 32 SENSITIVE Sensitive     TRIMETH/SULFA <=20 SENSITIVE Sensitive     AMPICILLIN/SULBACTAM 16 INTERMEDIATE Intermediate     PIP/TAZO <=4 SENSITIVE Sensitive     * >=100,000 COLONIES/mL KLEBSIELLA OXYTOCA   Proteus penneri - MIC*    AMPICILLIN >=32 RESISTANT Resistant     CEFAZOLIN >=64 RESISTANT Resistant     CEFEPIME <=0.12 SENSITIVE Sensitive     CEFTRIAXONE <=0.25 SENSITIVE Sensitive     CIPROFLOXACIN <=0.25 SENSITIVE Sensitive     GENTAMICIN <=1 SENSITIVE Sensitive     IMIPENEM 2 SENSITIVE Sensitive     NITROFURANTOIN 128 RESISTANT Resistant     TRIMETH/SULFA <=20 SENSITIVE Sensitive     AMPICILLIN/SULBACTAM 4 SENSITIVE Sensitive     PIP/TAZO <=4 SENSITIVE Sensitive     * 50,000 COLONIES/mL PROTEUS PENNERI  Resp Panel by RT-PCR (Flu A&B, Covid) Urine, Catheterized     Status: None   Collection Time: 12/17/21 11:47 PM   Specimen: Urine, Catheterized; Nasopharyngeal(NP) swabs in vial transport medium  Result Value Ref Range Status   SARS Coronavirus 2 by RT PCR NEGATIVE NEGATIVE Final    Comment: (NOTE) SARS-CoV-2 target nucleic acids are NOT DETECTED.  The SARS-CoV-2 RNA is generally detectable in upper respiratory specimens during  the acute phase of infection. The lowest concentration of SARS-CoV-2 viral copies this assay can detect is 138 copies/mL. A negative result does not preclude SARS-Cov-2 infection and should not be used as the sole basis for treatment or other patient management decisions. A negative result may occur with  improper specimen collection/handling, submission of specimen other than nasopharyngeal swab, presence of viral mutation(s) within the areas targeted by this assay, and inadequate number of viral copies(<138 copies/mL). A negative result must be combined with clinical observations, patient history, and epidemiological information. The expected result is Negative.  Fact Sheet for Patients:  EntrepreneurPulse.com.au  Fact Sheet for Healthcare Providers:  IncredibleEmployment.be  This test is no t yet approved or cleared by the [place] FDA and  has been authorized for detection and/or diagnosis of SARS-CoV-2 by FDA under an Emergency Use Authorization (EUA). This EUA will remain  in effect (meaning this test can be used) for the duration of the COVID-19 declaration under Section 564(b)(1) of the Act, 21 U.S.C.section 360bbb-3(b)(1), unless the authorization is terminated  or revoked sooner.       Influenza A by PCR NEGATIVE NEGATIVE Final   Influenza B by PCR NEGATIVE NEGATIVE Final    Comment: (NOTE) The Xpert Xpress SARS-CoV-2/FLU/RSV plus assay is intended as an aid in the diagnosis of influenza from Nasopharyngeal swab specimens and should not be used as a sole basis for treatment. Nasal washings and aspirates are unacceptable for Xpert Xpress SARS-CoV-2/FLU/RSV testing.  Fact Sheet for Patients: EntrepreneurPulse.com.au  Fact Sheet  for Healthcare Providers: IncredibleEmployment.be  This test is not yet approved or cleared by the [place] and has been authorized for detection and/or  diagnosis of SARS-CoV-2 by FDA under an Emergency Use Authorization (EUA). This EUA will remain in effect (meaning this test can be used) for the duration of the COVID-19 declaration under Section 564(b)(1) of the Act, 21 U.S.C. section 360bbb-3(b)(1), unless the authorization is terminated or revoked.  Performed at [facility], [place] [address], [place], [place] [address]      Labs: Basic Metabolic Panel: Recent Labs  Lab 12/18/21 0356 12/19/21 0321 12/20/21 0313 12/21/21 0325 12/22/21 0320  NA 138 138 136 138 134*  K 5.3* 4.4 3.6 4.2 4.5  CL 109 111 106 106 104  CO2 17* 17* 24 25 23   GLUCOSE 321* 196* 85 236* 352*  BUN 58* 59* 48* 40* 38*  CREATININE 1.49* 1.61* 1.29* 1.43* 1.15*  CALCIUM 9.1 8.7* 8.4* 8.4* 8.2*   Liver Function Tests: Recent Labs  Lab 12/17/21 1719 12/18/21 0356  AST 18 15  ALT 10 11  ALKPHOS 94 80  BILITOT 1.0 1.4*  PROT 6.8 5.9*  ALBUMIN 3.3* 2.7*   Recent Labs  Lab 12/17/21 1719  LIPASE 32   No results for input(s): AMMONIA in the last 168 hours. CBC: Recent Labs  Lab 12/17/21 1719 12/18/21 0356 12/19/21 0321 12/20/21 0313 12/21/21 0325 12/22/21 0320  WBC 9.4 8.8 6.7 5.4 6.0 5.2  NEUTROABS 7.7 6.8  --   --   --  3.1  HGB 11.6* 9.6* 7.6* 7.4* 8.2* 8.0*  HCT 35.9* 30.1* 24.5* 22.3* 25.2* 24.5*  MCV 113.6* 114.9* 116.1* 110.9* 110.5* 110.9*  PLT 222 196 192 177 195 193   Cardiac Enzymes: No results for input(s): CKTOTAL, CKMB, CKMBINDEX, TROPONINI in the last 168 hours. BNP: BNP (last 3 results) No results for input(s): BNP in the last 8760 hours.  ProBNP (last 3 results) No results for input(s): PROBNP in the last 8760 hours.  CBG: Recent Labs  Lab 12/21/21 1528 12/21/21 2122 12/22/21 0736 12/22/21 0918 12/22/21 1116  GLUCAP 206* 268* 355* 235* 214*       Signed:  Irine Seal MD.  [facility] 12/22/2021, 3:15 PM

## 2021-12-22 NOTE — Plan of Care (Signed)
Patient discharged home with husband. Ivan Anchors, RN 12/22/21 4:33 PM

## 2021-12-22 NOTE — Progress Notes (Signed)
Physical Therapy Treatment Patient Details Name: [name] MRN: [id number] DOB: October 11, 1961 Today's Date: 12/22/2021   History of Present Illness Patient is a 60 year old female who presented to the hospital with nausea and vomitting. patient was admitted with acute pyelonephritis, acute renal failure. PMH: type I DM, renal transfer, CKD, R BKA, IV decubituce ulcer.    PT Comments    Pt progressing well, very motivated to work with PT. Will benefit from HHPT at d/c  Recommendations for follow up therapy are one component of a multi-disciplinary discharge planning process, led by the attending physician.  Recommendations may be updated based on patient status, additional functional criteria and insurance authorization.  Follow Up Recommendations  Home health PT     Assistance Recommended at Discharge Intermittent Supervision/Assistance  Equipment Recommendations  None recommended by PT    Recommendations for Other Services       Precautions / Restrictions Precautions Precautions: Fall Precaution Comments: R BKA, IV wound on sacral area. Restrictions Weight Bearing Restrictions: No     Mobility  Bed Mobility               General bed mobility comments: in recliner    Transfers     Transfers: Sit to/from Stand             General transfer comment: repeated partial sit<>stands x 10 for strengthening; able to come to partial stand with min-mod +2    Ambulation/Gait               General Gait Details: Pt nonambulatory at this time   Stairs             Wheelchair Mobility    Modified Rankin (Stroke Patients Only)       Balance Overall balance assessment: Needs assistance Sitting-balance support: Feet supported;No upper extremity supported Sitting balance-Leahy Scale: Good Sitting balance - Comments: able to work on lateral wt shifting, scooting in chair with and without UE support     Standing balance-Leahy Scale: Zero                               Cognition Arousal/Alertness: Awake/alert Behavior During Therapy: WFL for tasks assessed/performed Overall Cognitive Status: Within Functional Limits for tasks assessed                                          Exercises General Exercises - Lower Extremity Ankle Circles/Pumps: AROM;Left;10 reps Quad Sets: AROM;Both;10 reps;Strengthening Gluteal Sets: AROM;Strengthening;Both;10 reps Short Arc Quad: AAROM;10 reps;Right Long Arc Quad: AROM;AAROM;Both;10 reps;Seated Hip ABduction/ADduction: AAROM;Both;10 reps Hip Flexion/Marching: AROM;Right;5 reps;Seated    General Comments        Pertinent Vitals/Pain Pain Assessment: 0-10 Pain Score: 2  Pain Location: wound Pain Descriptors / Indicators: Discomfort;Grimacing Pain Intervention(s): Limited activity within patient's tolerance;Monitored during session;Repositioned    Home Living                          Prior Function            PT Goals (current goals can now be found in the care plan section) Acute Rehab PT Goals Patient Stated Goal: Regain IND PT Goal Formulation: With patient Time For Goal Achievement: 12/19/21 Potential to Achieve Goals: Fair Progress towards PT goals: Progressing toward goals  Frequency    Min 3X/week      PT Plan Current plan remains appropriate    Co-evaluation              AM-PAC PT "6 Clicks" Mobility   Outcome Measure  Help needed turning from your back to your side while in a flat bed without using bedrails?: A Little Help needed moving from lying on your back to sitting on the side of a flat bed without using bedrails?: A Lot Help needed moving to and from a bed to a chair (including a wheelchair)?: A Lot Help needed standing up from a chair using your arms (e.g., wheelchair or bedside chair)?: Total Help needed to walk in hospital room?: Total Help needed climbing 3-5 steps with a railing? : Total 6 Click  Score: 10    End of Session Equipment Utilized During Treatment: Gait belt Activity Tolerance: Patient tolerated treatment well Patient left: with call bell/phone within reach;in chair;with chair alarm set Nurse Communication: Mobility status PT Visit Diagnosis: Difficulty in walking, not elsewhere classified (R26.2);Muscle weakness (generalized) (M62.81)     Time: 2263-3354 PT Time Calculation (min) (ACUTE ONLY): 35 min  Charges:  $Therapeutic Exercise: 8-22 mins $Therapeutic Activity: 8-22 mins                     Baxter Flattery, PT  Acute Rehab Dept ([place]) [phone] Pager [phone]  12/22/2021    [facility] 12/22/2021, 10:58 AM

## 2021-12-22 NOTE — Evaluation (Signed)
Clinical/Bedside Swallow Evaluation Patient Details  Name: [name] MRN: [id number] Date of Birth: December 10, 1961  Today's Date: 12/22/2021 Time: SLP Start Time (ACUTE ONLY): 1350 SLP Stop Time (ACUTE ONLY): 8309 SLP Time Calculation (min) (ACUTE ONLY): 15 min  Past Medical History:  Past Medical History:  Diagnosis Date   Anemia of chronic renal failure    Cellulitis and abscess of right leg 09/26/2020   Chronic osteomyelitis of sacrum ([place]) 08/25/2021   DDD (degenerative disc disease), lumbar    Diabetes mellitus without complication ([place])    type 1   Diabetic ketoacidosis ([place]) 09/26/2020   Gait disorder    neurogenic   Gangrene of right foot ([place])    Hypertension    Hypothyroid 12/08/2020   Neuropathy    Renal disease    Sacral decubitus ulcer, stage IV ([place]) 12/08/2020   Subacute osteomyelitis of right foot [facility])    Past Surgical History:  Past Surgical History:  Procedure Laterality Date   AMPUTATION Right 10/01/2020   Procedure: RIGHT BELOW KNEE AMPUTATION;  Surgeon: Newt Minion, MD;  Location: [facility];  Service: Orthopedics;  Laterality: Right;   BLADDER SURGERY     ESOPHAGOGASTRODUODENOSCOPY (EGD) WITH PROPOFOL N/A 11/10/2020   Procedure: ESOPHAGOGASTRODUODENOSCOPY (EGD) WITH PROPOFOL;  Surgeon: Irene Shipper, MD;  Location: [facility];  Service: Endoscopy;  Laterality: N/A;   IR GASTROSTOMY TUBE REMOVAL  03/25/2021   KIDNEY TRANSPLANT     LEG SURGERY     tib-fib fx , ORIF   HPI:  Patient is a 60 year old female who presented to the hospital 12/22 with nausea and vomiting. Patient was admitted with acute pyelonephritis, acute renal failure. PMH: type I DM, renal transfer, CKD, R BKA, IV decub ulcer. Pt's swallowing has been evaluated 09/29/21 and 12/09/21. She has had coughing episodes in the past when eating foods like bacon or rice; prior evals make note of globus sensation and xerostomia.  It has been recommended that she continue regular solids/thin liquids.     Assessment / Plan / Recommendation  Clinical Impression  Pt presented with functional swallowing with no observed s/s of dysphagia. She describes ongoing and intermittent coughing, often related to particular food items like rice and bacon and describes more difficulty with solids than liquids. She endorses dryness as contributory.  She demonstrated adequate and thorough mastication of solids and no s/s of aspiration with liquids/solids.  No c/o globus during eval. We discussed the option of requesting consult for OP MBS from PCP if symptoms persist or worsen. Ms. Britz agrees with plan - recommend continuing regular solids, thin liquids. No further needs are identified. SLP Visit Diagnosis: Dysphagia, unspecified (R13.10)    Aspiration Risk  No limitations    Diet Recommendation   Regular solids, thin liquids  Medication Administration: Whole meds with liquid    Other  Recommendations Oral Care Recommendations: Oral care BID    Recommendations for follow up therapy are one component of a multi-disciplinary discharge planning process, led by the attending physician.  Recommendations may be updated based on patient status, additional functional criteria and insurance authorization.  Follow up Recommendations No SLP follow up      Assistance Recommended at Discharge    Functional Status Assessment    Frequency and Duration            Prognosis        Swallow Study   General Date of Onset: 12/17/21 HPI: Patient is a 60 year old female who presented to  the hospital 12/22 with nausea and vomiting. Patient was admitted with acute pyelonephritis, acute renal failure. PMH: type I DM, renal transfer, CKD, R BKA, IV decub ulcer. Pt's swallowing has been evaluated 09/29/21 and 12/09/21. She has had coughing episodes in the past when eating foods like bacon or rice; prior evals make note of globus sensation and xerostomia.  It has been recommended that she continue regular solids/thin  liquids. Type of Study: Bedside Swallow Evaluation Previous Swallow Assessment: see HPI Diet Prior to this Study: Regular;Thin liquids Temperature Spikes Noted: No Respiratory Status: Room air History of Recent Intubation: No Behavior/Cognition: Alert;Cooperative;Pleasant mood Oral Cavity Assessment: Within Functional Limits Oral Care Completed by SLP: No Oral Cavity - Dentition: Adequate natural dentition Vision: Functional for self-feeding Self-Feeding Abilities: Able to feed self Patient Positioning: Upright in chair Baseline Vocal Quality: Normal Volitional Cough: Strong Volitional Swallow: Able to elicit    Oral/Motor/Sensory Function Overall Oral Motor/Sensory Function: Within functional limits   Ice Chips Ice chips: Within functional limits   Thin Liquid Thin Liquid: Within functional limits    Nectar Thick Nectar Thick Liquid: Not tested   Honey Thick Honey Thick Liquid: Not tested   Puree Puree: Within functional limits   Solid     Solid: Within functional limits      [name] 12/22/2021,2:08 PM Estill Bamberg L. Tivis Ringer, [place] Office number [phone] Pager [phone]

## 2022-01-01 ENCOUNTER — Other Ambulatory Visit: Payer: Self-pay

## 2022-01-01 ENCOUNTER — Encounter (HOSPITAL_BASED_OUTPATIENT_CLINIC_OR_DEPARTMENT_OTHER): Payer: BC Managed Care – PPO | Attending: Internal Medicine | Admitting: Internal Medicine

## 2022-01-01 DIAGNOSIS — Z940 Kidney transplant status: Secondary | ICD-10-CM | POA: Diagnosis not present

## 2022-01-01 DIAGNOSIS — L89153 Pressure ulcer of sacral region, stage 3: Secondary | ICD-10-CM | POA: Insufficient documentation

## 2022-01-01 DIAGNOSIS — Z89511 Acquired absence of right leg below knee: Secondary | ICD-10-CM | POA: Insufficient documentation

## 2022-01-01 DIAGNOSIS — E10622 Type 1 diabetes mellitus with other skin ulcer: Secondary | ICD-10-CM | POA: Diagnosis present

## 2022-01-01 NOTE — Progress Notes (Addendum)
CALVIN, CHURA ([id number]) Visit Report for 01/01/2022 Arrival Information Details Patient Name: Date of Service: NIASHA, DEVINS 01/01/2022 11:00 [place] Record Number: [id number] Patient Account Number: [id number] Date of Birth/Sex: Treating RN: [birth year] (61 y.o. [name], [name] Primary Care Toua Stites: [name] Other Clinician: Referring Denielle Bayard: Treating Fredericka Bottcher/Extender: [name] in Treatment: 28 Visit Information History Since Last Visit Added or deleted any medications: No Patient Arrived: Wheel Chair Any new allergies or adverse reactions: No Arrival Time: 10:54 Had a fall or experienced change in No Accompanied By: husband activities of daily living that may affect Transfer Assistance: Manual risk of falls: Patient Identification Verified: Yes Signs or symptoms of abuse/neglect since last visito No Secondary Verification Process Completed: Yes Hospitalized since last visit: No Patient Requires Transmission-Based Precautions: No Implantable device outside of the clinic excluding No Patient Has Alerts: No cellular tissue based products placed in the center since last visit: Has Dressing in Place as Prescribed: Yes Pain Present Now: No Electronic Signature(s) Signed: 01/01/2022 11:40:24 AM By: Rhae Hammock RN Entered By: Rhae Hammock on 01/01/2022 10:54:32 -------------------------------------------------------------------------------- Clinic Level of Care Assessment Details Patient Name: Date of Service: ARNESHA, SCHIRALDI 01/01/2022 11:00 [place] Record Number: [id number] Patient Account Number: [id number] Date of Birth/Sex: Treating RN: Sep 05, 1961 (61 y.o. Helene Shoe, Meta.Reding Primary Care Keaton Beichner: [name] Other Clinician: Referring Alexas Basulto: Treating Luda Charbonneau/Extender: [name] in Treatment: 28 Clinic Level of Care Assessment Items TOOL 4 Quantity Score X- 1  0 Use when only an EandM is performed on FOLLOW-UP visit ASSESSMENTS - Nursing Assessment / Reassessment X- 1 10 Reassessment of Co-morbidities (includes updates in patient status) X- 1 5 Reassessment of Adherence to Treatment Plan ASSESSMENTS - Wound and Skin A ssessment / Reassessment X - Simple Wound Assessment / Reassessment - one wound 1 5 []  - 0 Complex Wound Assessment / Reassessment - multiple wounds X- 1 10 Dermatologic / Skin Assessment (not related to wound area) ASSESSMENTS - Focused Assessment []  - 0 Circumferential Edema Measurements - multi extremities X- 1 10 Nutritional Assessment / Counseling / Intervention []  - 0 Lower Extremity Assessment (monofilament, tuning fork, pulses) []  - 0 Peripheral Arterial Disease Assessment (using hand held doppler) ASSESSMENTS - Ostomy and/or Continence Assessment and Care []  - 0 Incontinence Assessment and Management []  - 0 Ostomy Care Assessment and Management (repouching, etc.) PROCESS - Coordination of Care X - Simple Patient / Family Education for ongoing care 1 15 []  - 0 Complex (extensive) Patient / Family Education for ongoing care X- 1 10 Staff obtains Consents, Records, T Results / Process Orders est X- 1 10 Staff telephones HHA, Nursing Homes / Clarify orders / etc []  - 0 Routine Transfer to another Facility (non-emergent condition) []  - 0 Routine Hospital Admission (non-emergent condition) []  - 0 New Admissions / Biomedical engineer / Ordering NPWT Apligraf, etc. , []  - 0 Emergency Hospital Admission (emergent condition) X- 1 10 Simple Discharge Coordination []  - 0 Complex (extensive) Discharge Coordination PROCESS - Special Needs []  - 0 Pediatric / Minor Patient Management []  - 0 Isolation Patient Management []  - 0 Hearing / Language / Visual special needs []  - 0 Assessment of Community assistance (transportation, D/C planning, etc.) []  - 0 Additional assistance / Altered mentation []  -  0 Support Surface(s) Assessment (bed, cushion, seat, etc.) INTERVENTIONS - Wound Cleansing / Measurement X - Simple Wound Cleansing - one wound 1 5 []  - 0 Complex Wound Cleansing - multiple wounds X-  1 5 Wound Imaging (photographs - any number of wounds) []  - 0 Wound Tracing (instead of photographs) X- 1 5 Simple Wound Measurement - one wound []  - 0 Complex Wound Measurement - multiple wounds INTERVENTIONS - Wound Dressings X - Small Wound Dressing one or multiple wounds 1 10 []  - 0 Medium Wound Dressing one or multiple wounds []  - 0 Large Wound Dressing one or multiple wounds []  - 0 Application of Medications - topical []  - 0 Application of Medications - injection INTERVENTIONS - Miscellaneous []  - 0 External ear exam []  - 0 Specimen Collection (cultures, biopsies, blood, body fluids, etc.) []  - 0 Specimen(s) / Culture(s) sent or taken to Lab for analysis []  - 0 Patient Transfer (multiple staff / Civil Service fast streamer / Similar devices) []  - 0 Simple Staple / Suture removal (25 or less) []  - 0 Complex Staple / Suture removal (26 or more) []  - 0 Hypo / Hyperglycemic Management (close monitor of Blood Glucose) []  - 0 Ankle / Brachial Index (ABI) - do not check if billed separately X- 1 5 Vital Signs Has the patient been seen at the hospital within the last three years: Yes Total Score: 115 Level Of Care: New/Established - Level 3 Electronic Signature(s) Signed: 01/01/2022 11:40:57 AM By: [name] RN, BSN Entered By: [name] on 01/01/2022 11:39:09 -------------------------------------------------------------------------------- Encounter Discharge Information Details Patient Name: Date of Service: AZRAEL, HUSS 01/01/2022 11:00 [place] Record Number: [id number] Patient Account Number: [id number] Date of Birth/Sex: Treating RN: 17-Jun-1961 (61 y.o. [name] Primary Care Donesha Wallander: [name] Other Clinician: Referring Danaye Sobh: Treating  Shalondra Wunschel/Extender: [name] in Treatment: 28 Encounter Discharge Information Items Discharge Condition: Stable Ambulatory Status: Wheelchair Discharge Destination: Home Transportation: Private Auto Accompanied By: husband Schedule Follow-up Appointment: Yes Clinical Summary of Care: Electronic Signature(s) Signed: 01/01/2022 11:40:57 AM By: [name] RN, BSN Entered By: [name] on 01/01/2022 11:40:42 -------------------------------------------------------------------------------- Lower Extremity Assessment Details Patient Name: Date of Service: CLARRISSA, SHIMKUS 01/01/2022 11:00 [facility] Record Number: [id number] Patient Account Number: [id number] Date of Birth/Sex: Treating RN: Apr 24, 1961 (61 y.o. [name], [name] Primary Care Jahmil Macleod: [name] Other Clinician: Referring Husna Krone: Treating Charletta Voight/Extender: [name]: 28 Electronic Signature(s) Signed: 01/01/2022 11:40:24 AM By: Rhae Hammock RN Entered By: Rhae Hammock on 01/01/2022 10:55:16 -------------------------------------------------------------------------------- Multi Wound Chart Details Patient Name: Date of Service: MADINAH, QUARRY 01/01/2022 11:00 [facility] Record Number: [id number] Patient Account Number: [id number] Date of Birth/Sex: Treating RN: 10-Mar-1961 (61 y.o. Elam Dutch Primary Care Hong Timm: [name] Other Clinician: Referring Glena Pharris: Treating Savir Blanke/Extender: [name]: 28 Vital Signs Height(in): 53 Pulse(bpm): 93 Weight(lbs): 110 Blood Pressure(mmHg): 133/83 Body Mass Index(BMI): 28 Temperature(F): 98 Respiratory Rate(breaths/min): 17 Photos: [N/A:N/A] Sacrum N/A N/A Wound Location: Pressure Injury N/A N/A Wounding Event: Pressure Ulcer N/A N/A Primary Etiology: Anemia, Type I Diabetes, End Stage N/A N/A Comorbid  History: Renal Disease, Osteoarthritis, Osteomyelitis, Neuropathy, Received Radiation 02/24/2021 N/A N/A Date Acquired: 69 N/A N/A Weeks of Treatment: Open N/A N/A Wound Status: 3.8x3x0.9 N/A N/A Measurements L x W x D (cm) 8.954 N/A N/A A (cm) : rea 8.058 N/A N/A Volume (cm) : 63.70% N/A N/A % Reduction in A rea: 78.20% N/A N/A % Reduction in Volume: 9 Starting Position 1 (o'clock): 4 Ending Position 1 (o'clock): 1.4 Maximum Distance 1 (cm): Yes N/A N/A Undermining: Category/Stage III N/A N/A Classification: Medium N/A N/A Exudate A mount: Purulent N/A  N/A Exudate Type: yellow, brown, green N/A N/A Exudate Color: Epibole N/A N/A Wound Margin: Large (67-100%) N/A N/A Granulation A mount: Red, Pink N/A N/A Granulation Quality: Small (1-33%) N/A N/A Necrotic A mount: Fat Layer (Subcutaneous Tissue): Yes N/A N/A Exposed Structures: Fascia: No Tendon: No Muscle: No Joint: No Bone: No Medium (34-66%) N/A N/A Epithelialization: Treatment Notes Wound #1 (Sacrum) Cleanser Wound Cleanser Discharge Instruction: Cleanse the wound with wound cleanser prior to applying a clean dressing using gauze sponges, not tissue or cotton balls. Peri-Wound Care Skin Prep Discharge Instruction: Use skin prep as directed powder or cream antifungal Discharge Instruction: may use powder or cream as needed for skin irritation. Topical Primary Dressing Dakin's Solution wet to dry Discharge Instruction: Apply Dakin's moisten gauze to undermining and wound bed. Secondary Dressing ABD Pad, 5x9 Discharge Instruction: Apply over primary dressing. Secured With Paper Tape, 2x10 (in/yd) Discharge Instruction: Secure dressing with tape as directed. Compression Wrap Compression Stockings Add-Ons Electronic Signature(s) Signed: 01/01/2022 12:23:46 PM By: [name] DO Signed: 01/05/2022 5:43:46 PM By: Baruch Gouty RN, BSN Entered By: [name] on 01/01/2022  12:19:31 -------------------------------------------------------------------------------- Multi-Disciplinary Care Plan Details Patient Name: Date of Service: CAELYNN, MARSHMAN 01/01/2022 11:00 A [facility] Medical Record Number: [id number] Patient Account Number: [id number] Date of Birth/Sex: Treating RN: [birth year] (61 y.o. Helene Shoe, Tammi Klippel Primary Care Ledell Codrington: [name] Other Clinician: Referring Tyquon Near: Treating Zsofia Prout/Extender: [name]: 28 Active Inactive Wound/Skin Impairment Nursing Diagnoses: Impaired tissue integrity Knowledge deficit related to ulceration/compromised skin integrity Goals: Patient/caregiver will verbalize understanding of skin care regimen Date Initiated: 06/19/2021 Target Resolution Date: 02/26/2022 Goal Status: Active Ulcer/skin breakdown will have a volume reduction of 30% by week 4 Date Initiated: 06/19/2021 Date Inactivated: 07/27/2021 Target Resolution Date: 07/02/2021 Goal Status: Unmet Unmet Reason: pressure relief Interventions: Assess patient/caregiver ability to obtain necessary supplies Assess patient/caregiver ability to perform ulcer/skin care regimen upon admission and as needed Assess ulceration(s) every visit Provide education on ulcer and skin care Notes: Electronic Signature(s) Signed: 01/01/2022 11:40:57 AM By: [name] RN, BSN Entered By: [name] on 01/01/2022 11:10:10 -------------------------------------------------------------------------------- Pain Assessment Details Patient Name: Date of Service: LILLYMAE, DUET 01/01/2022 11:00 [place] Record Number: [id number] Patient Account Number: [id number] Date of Birth/Sex: Treating RN: Apr 26, 1961 (61 y.o. [name], [name] Primary Care Tigerlily Christine: [name] Other Clinician: Referring Rosetta Rupnow: Treating Delaine Hernandez/Extender: [name]: 28 Active Problems Location of Pain  Severity and Description of Pain Patient Has Paino No Site Locations Pain Management and Medication Current Pain Management: Electronic Signature(s) Signed: 01/01/2022 11:40:24 AM By: Rhae Hammock RN Entered By: Rhae Hammock on 01/01/2022 10:55:11 -------------------------------------------------------------------------------- Patient/Caregiver Education Details Patient Name: Date of Service: Wille Glaser 1/6/2023andnbsp11:00 [place] Record Number: [id number] Patient Account Number: [id number] Date of Birth/Gender: Treating RN: 11-02-1961 (61 y.o. [name] Primary Care Physician: [name] Other Clinician: Referring Physician: Treating Physician/Extender: [name] in Treatment: 28 Education Assessment Education Provided To: Patient Education Topics Provided Wound/Skin Impairment: Handouts: Skin Care Do's and Dont's Methods: Explain/Verbal Responses: Reinforcements needed Electronic Signature(s) Signed: 01/01/2022 11:40:57 AM By: [name] RN, BSN Entered By: [name] on 01/01/2022 11:10:37 -------------------------------------------------------------------------------- Wound Assessment Details Patient Name: Date of Service: LOUSIE, CALICO 01/01/2022 11:00 A M Medical Record Number: [id number] Patient Account Number: [id number] Date of Birth/Sex: Treating RN: April 17, 1961 (61 y.o. [name], [name] Primary Care Chelcie Estorga: [name] Other Clinician: Referring Olamide Lahaie: Treating Valena Ivanov/Extender: Hoyt Koch  [name]: 28 Wound Status Wound Number: 1 Primary Pressure Ulcer Etiology: Wound Location: Sacrum Wound Open Wounding Event: Pressure Injury Status: Date Acquired: 02/24/2021 Comorbid Anemia, Type I Diabetes, End Stage Renal Disease, Osteoarthritis, Weeks Of Treatment: 28 History: Osteomyelitis, Neuropathy, Received Radiation Clustered Wound:  No Photos Wound Measurements Length: (cm) 3.8 Width: (cm) 3 Depth: (cm) 0.9 Area: (cm) 8.954 Volume: (cm) 8.058 % Reduction in Area: 63.7% % Reduction in Volume: 78.2% Epithelialization: Medium (34-66%) Tunneling: No Undermining: Yes Starting Position (o'clock): 9 Ending Position (o'clock): 4 Maximum Distance: (cm) 1.4 Wound Description Classification: Category/Stage III Wound Margin: Epibole Exudate Amount: Medium Exudate Type: Purulent Exudate Color: yellow, brown, green Foul Odor After Cleansing: No Slough/Fibrino Yes Wound Bed Granulation Amount: Large (67-100%) Exposed Structure Granulation Quality: Red, Pink Fascia Exposed: No Necrotic Amount: Small (1-33%) Fat Layer (Subcutaneous Tissue) Exposed: Yes Necrotic Quality: Adherent Slough Tendon Exposed: No Muscle Exposed: No Joint Exposed: No Bone Exposed: No Treatment Notes Wound #1 (Sacrum) Cleanser Wound Cleanser Discharge Instruction: Cleanse the wound with wound cleanser prior to applying a clean dressing using gauze sponges, not tissue or cotton balls. Peri-Wound Care Skin Prep Discharge Instruction: Use skin prep as directed powder or cream antifungal Discharge Instruction: may use powder or cream as needed for skin irritation. Topical Primary Dressing Dakin's Solution wet to dry Discharge Instruction: Apply Dakin's moisten gauze to undermining and wound bed. Secondary Dressing ABD Pad, 5x9 Discharge Instruction: Apply over primary dressing. Secured With Paper Tape, 2x10 (in/yd) Discharge Instruction: Secure dressing with tape as directed. Compression Wrap Compression Stockings Add-Ons Electronic Signature(s) Signed: 01/01/2022 11:40:24 AM By: Rhae Hammock RN Entered By: Rhae Hammock on 01/01/2022 10:59:28 -------------------------------------------------------------------------------- Vitals Details Patient Name: Date of Service: JOYANNA, KLEMAN 01/01/2022 11:00 A M Medical Record  Number: [id number] Patient Account Number: [id number] Date of Birth/Sex: Treating RN: 1961-02-11 (61 y.o. [name], [name] Primary Care Talulah Schirmer: [name] Other Clinician: Referring Corine Solorio: Treating Anton Cheramie/Extender: [name]: 28 Vital Signs Time Taken: 10:54 Temperature (F): 98 Height (in): 53 Pulse (bpm): 93 Weight (lbs): 110 Respiratory Rate (breaths/min): 17 Body Mass Index (BMI): 27.5 Blood Pressure (mmHg): 133/83 Reference Range: 80 - 120 mg / dl Electronic Signature(s) Signed: 01/01/2022 11:40:24 AM By: Rhae Hammock RN Entered By: Rhae Hammock on 01/01/2022 10:55:06

## 2022-01-04 NOTE — Progress Notes (Signed)
BETHENNY, LOSEE ([id number]) Visit Report for 01/01/2022 Chief Complaint Document Details Patient Name: Date of Service: DORITA, ROWLANDS 01/01/2022 11:00 A M Medical Record Number: [id number] Patient Account Number: [id number] Date of Birth/Sex: Treating RN: 23-Feb-1961 (61 y.o. Elam Dutch Primary Care Provider: [name] Other Clinician: Referring Provider: Treating Provider/Extender: [name]: 28 Information Obtained from: Patient Chief Complaint Sacral ulcer Electronic Signature(s) Signed: 01/01/2022 12:23:46 PM By: [name] DO Entered By: [name] on 01/01/2022 12:19:43 -------------------------------------------------------------------------------- HPI Details Patient Name: Date of Service: JAMONICA, SCHOFF 01/01/2022 11:00 [place] Record Number: [id number] Patient Account Number: [id number] Date of Birth/Sex: Treating RN: 01/25/1961 (61 y.o. Elam Dutch Primary Care Provider: [name] Other Clinician: Referring Provider: Treating Provider/Extender: [name] in Treatment: 28 History of Present Illness HPI Description: Admission 6/24 Ms. Lakashia Collison is a 61 year old female with a past medical history of type 1 diabetes, right BKA, and kidney transplant that presents to the clinic for an 46-month history of sacral ulcer. She has been using wet-to-dry dressings up until 5 weeks ago when she was started on a wound VAC. 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 She has completed 6 weeks of treatment and states she has been discharged by infectious disease. She currently denies any issues to the wound area. She reports minimal pain. She denies signs of infection. 7/11; patient presents for 2-week follow-up. She uses a wound VAC that is changed with home health 3 times a week. She denies any signs of infection.  She has no complaints or issues today. 8/1; patient presents for follow-up. She continues to use a wound VAC and this is changed with home health. She reports pain to the sacrum that started 1 week ago. She denies systemic signs of infection. 8/15; patient presents for 2-week follow-up. She has been using wet-to-dry dressings daily. She reports improvement in the wound size and appearance. She denies infectious symptoms. 9/12; patient presents for follow-up. She has been using wet-to-dry Dakin's moistened gauze. She reports following up with infectious disease and is currently taking amoxicillin. 10/6; patient presents for follow-up. She has been using Dakin's wet-to-dry moistened gauze dressings. She is still taking amoxicillin. She canceled her appointment with infectious disease yesterday because she is tired of going to doctors appointments. She has not picked up gentamicin cream from the pharmacy to use on the wound bed. She currently denies signs of infection. 11/3; patient presents for follow-up. She has been using Dakin's wet-to-dry dressings. She has no issues or complaints today. She denies signs of infection. 12/1; patient presents for follow-up. She continues to use Dakin's wet-to-dry dressings. Patient followed up with Dr. Tommy Medal, ID and amoxicillin was stopped as she had completed her course for sacral osteomyelitis. She reports itching to the periwound. She denies signs of infection. 1/6; patient presents for follow-up. She has been using Dakin's wet-to-dry dressings. She never received the wound VAC. She reports improvement with ketoconazole to the periwound. She has no issues or complaints today. She denies signs of infection. Electronic Signature(s) Signed: 01/01/2022 12:23:46 PM By: [name] DO Entered By: [name] on 01/01/2022 12:20:14 -------------------------------------------------------------------------------- Physical Exam Details Patient Name: Date of  Service: LUCRETIA, PENDLEY 01/01/2022 11:00 A M Medical Record Number: [id number] Patient Account Number: [id number] Date of Birth/Sex: Treating RN: Jun 14, 1961 (61 y.o. Elam Dutch Primary Care Provider: [name] Other Clinician: Referring Provider: Treating Provider/Extender: [name]  [name] [name]: 28 Constitutional respirations regular, non-labored and within target range for patient.Marland Kitchen Psychiatric pleasant and cooperative. Notes Sacral region: Large open wound with granulation tissue present. Undermining circumferentially. No Irritation to the periwound. No signs of soft tissue infection. Electronic Signature(s) Signed: 01/01/2022 12:23:46 PM By: [name] DO Entered By: [name] on 01/01/2022 12:20:57 -------------------------------------------------------------------------------- Physician Orders Details Patient Name: Date of Service: SHANDALE, MALAK 01/01/2022 11:00 [place] Record Number: [id number] Patient Account Number: [id number] Date of Birth/Sex: Treating RN: [birth year] (61 y.o. [name] Primary Care Provider: [name] Other Clinician: Referring Provider: Treating Provider/Extender: [name] in Treatment: 62 Verbal / Phone Orders: No Diagnosis Coding ICD-10 Coding Code Description E10.622 Type 1 diabetes mellitus with other skin ulcer L89.153 Pressure ulcer of sacral region, stage 3 Z89.511 Acquired absence of right leg below knee Z94.0 Kidney transplant status Follow-up Appointments Return appointment in 1 month. - Dr. [place] Cellular or Tissue Based Products Cellular or Tissue Based Product Type: - Run IVR for Theraskin=Not Covered Bathing/ Shower/ Hygiene Do not shower or bathe in tub. Negative Presssure Wound Therapy Discontinue wound vac - No need for wound vac at this time. Off-Loading Turn and reposition every 2 hours Additional Orders /  Instructions Follow Nutritious Diet Home Health No change in wound care orders this week; continue Home Health for wound care. May utilize formulary equivalent dressing for wound treatment orders unless otherwise specified. - weekly changes by home health. Other Home Health Orders/Instructions: - [place] Wound Treatment Wound #1 - Sacrum Cleanser: Wound Cleanser ([place]) 1 x Per Day/30 Days Discharge Instructions: Cleanse the wound with wound cleanser prior to applying a clean dressing using gauze sponges, not tissue or cotton balls. Peri-Wound Care: Skin Prep (Home Health) 1 x Per Day/30 Days Discharge Instructions: Use skin prep as directed Peri-Wound Care: powder or cream antifungal 1 x Per Day/30 Days Discharge Instructions: may use powder or cream as needed for skin irritation. Prim Dressing: Dakin's Solution wet to dry (Home Health) 1 x Per Day/30 Days ary Discharge Instructions: Apply Dakin's moisten gauze to undermining and wound bed. Secondary Dressing: ABD Pad, 5x9 (Home Health) 1 x Per Day/30 Days Discharge Instructions: Apply over primary dressing. Secured With: Paper Tape, 2x10 (in/yd) (Home Health) 1 x Per Day/30 Days Discharge Instructions: Secure dressing with tape as directed. Electronic Signature(s) Signed: 01/01/2022 12:23:46 PM By: [name] DO Previous Signature: 01/01/2022 11:40:57 AM Version By: [name] RN, BSN Entered By: [name] on 01/01/2022 12:21:17 -------------------------------------------------------------------------------- Problem List Details Patient Name: Date of Service: NIKESHIA, KEETCH 01/01/2022 11:00 [place] Record Number: [id number] Patient Account Number: [id number] Date of Birth/Sex: Treating RN: [birth year] (61 y.o. [name] Primary Care Provider: [name] Other Clinician: Referring Provider: Treating Provider/Extender: [name]:  36 Active Problems ICD-10 Encounter Code Description Active Date MDM Diagnosis E10.622 Type 1 diabetes mellitus with other skin ulcer 10/29/2021 No Yes L89.153 Pressure ulcer of sacral region, stage 3 06/19/2021 No Yes Z89.511 Acquired absence of right leg below knee 06/19/2021 No Yes Z94.0 Kidney transplant status 06/19/2021 No Yes Inactive Problems Resolved Problems Electronic Signature(s) Signed: 01/01/2022 12:23:46 PM By: [name] DO Previous Signature: 01/01/2022 11:40:57 AM Version By: [name] RN, BSN Entered By: [name] on 01/01/2022 12:19:22 -------------------------------------------------------------------------------- Progress Note Details Patient Name: Date of Service: KORALYNN, GREENSPAN 01/01/2022 11:00 [place] Record Number: [id number] Patient Account Number: [id number] Date of  Birth/Sex: Treating RN: August 21, 1961 (61 y.o. Elam Dutch Primary Care Provider: [name] Other Clinician: Referring Provider: Treating Provider/Extender: [name] in Treatment: 28 Subjective Chief Complaint Information obtained from Patient Sacral ulcer History of Present Illness (HPI) Admission 6/24 Ms. Tarica Harl is a 61 year old female with a past medical history of type 1 diabetes, right BKA, and kidney transplant that presents to the clinic for an 31-month history of sacral ulcer. She has been using wet-to-dry dressings up until 5 weeks ago when she was started on a wound VAC. 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 She has completed 6 weeks of treatment and states she has been discharged by infectious disease. She currently denies any issues to the wound area. She reports minimal pain. She denies signs of infection. 7/11; patient presents for 2-week follow-up. She uses a wound VAC that is changed with home health 3 times a week. She denies any signs of infection.  She has no complaints or issues today. 8/1; patient presents for follow-up. She continues to use a wound VAC and this is changed with home health. She reports pain to the sacrum that started 1 week ago. She denies systemic signs of infection. 8/15; patient presents for 2-week follow-up. She has been using wet-to-dry dressings daily. She reports improvement in the wound size and appearance. She denies infectious symptoms. 9/12; patient presents for follow-up. She has been using wet-to-dry Dakin's moistened gauze. She reports following up with infectious disease and is currently taking amoxicillin. 10/6; patient presents for follow-up. She has been using Dakin's wet-to-dry moistened gauze dressings. She is still taking amoxicillin. She canceled her appointment with infectious disease yesterday because she is tired of going to doctors appointments. She has not picked up gentamicin cream from the pharmacy to use on the wound bed. She currently denies signs of infection. 11/3; patient presents for follow-up. She has been using Dakin's wet-to-dry dressings. She has no issues or complaints today. She denies signs of infection. 12/1; patient presents for follow-up. She continues to use Dakin's wet-to-dry dressings. Patient followed up with Dr. Tommy Medal, ID and amoxicillin was stopped as she had completed her course for sacral osteomyelitis. She reports itching to the periwound. She denies signs of infection. 1/6; patient presents for follow-up. She has been using Dakin's wet-to-dry dressings. She never received the wound VAC. She reports improvement with ketoconazole to the periwound. She has no issues or complaints today. She denies signs of infection. Patient History Information obtained from Patient. Family History Cancer - Father, Diabetes - Father, Heart Disease - Maternal Grandparents, Hypertension - Mother, Thyroid Problems - Mother,Siblings, No family history of Hereditary Spherocytosis, Kidney  Disease, Lung Disease, Seizures, Stroke, Tuberculosis. Social History Former smoker - from teens to 60's, Marital Status - Married, Alcohol Use - Rarely - beer, Drug Use - No History, Caffeine Use - Daily - coffee. Medical History Hematologic/Lymphatic Patient has history of Anemia Endocrine Patient has history of Type I Diabetes - s Genitourinary Patient has history of End Stage Renal Disease - off dialysis since transplants Immunological Denies history of Lupus Erythematosus, Raynaudoos, Scleroderma Integumentary (Skin) Denies history of History of Burn Musculoskeletal Patient has history of Osteoarthritis, Osteomyelitis Neurologic Patient has history of Neuropathy Oncologic Patient has history of Received Radiation Denies history of Received Chemotherapy Hospitalization/Surgery History - infected wound (pressure) 2022. - (R) BKA amp Oct 2021. - UTI 12/22-12/27/2022. Objective Constitutional respirations regular, non-labored and within target range for  patient.. Vitals Time Taken: 10:54 AM, Height: 53 in, Weight: 110 lbs, BMI: 27.5, Temperature: 98 F, Pulse: 93 bpm, Respiratory Rate: 17 breaths/min, Blood Pressure: 133/83 mmHg. Psychiatric pleasant and cooperative. General Notes: Sacral region: Large open wound with granulation tissue present. Undermining circumferentially. No Irritation to the periwound. No signs of soft tissue infection. Integumentary (Hair, Skin) Wound #1 status is Open. Original cause of wound was Pressure Injury. The date acquired was: 02/24/2021. The wound has been in treatment 28 weeks. The wound is located on the Sacrum. The wound measures 3.8cm length x 3cm width x 0.9cm depth; 8.954cm^2 area and 8.058cm^3 volume. There is Fat Layer (Subcutaneous Tissue) exposed. There is no tunneling noted, however, there is undermining starting at 9:00 and ending at 4:00 with a maximum distance of 1.4cm. There is a medium amount of purulent drainage noted. The wound  margin is epibole. There is large (67-100%) red, pink granulation within the wound bed. There is a small (1-33%) amount of necrotic tissue within the wound bed including Adherent Slough. 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 No signs of infection. Unfortunately she did not receive the wound VAC despite the order being made. At this time I recommended continuing with Dakin's wet-to-dry dressing Looks slightly improved. The periwound has improved with the use of antifungal. I recommended continuing to aggressively offload the area. Plan Follow-up Appointments: Return appointment in 1 month. - Dr. Heber [place] Cellular or Tissue Based Products: Cellular or Tissue Based Product Type: - Run IVR for Theraskin=Not Covered Bathing/ Shower/ Hygiene: Do not shower or bathe in tub. Negative Presssure Wound Therapy: Discontinue wound vac - No need for wound vac at this time. Off-Loading: Turn and reposition every 2 hours Additional Orders / Instructions: Follow Nutritious Diet Home Health: No change in wound care orders this week; continue Home Health for wound care. May utilize formulary equivalent dressing for wound treatment orders unless otherwise specified. - weekly changes by home health. Other Home Health Orders/Instructions: - [place] #1: - Sacrum Wound Laterality: Cleanser: Wound Cleanser ([place]) 1 x Per Day/30 Days Discharge Instructions: Cleanse the wound with wound cleanser prior to applying a clean dressing using gauze sponges, not tissue or cotton balls. Peri-Wound Care: Skin Prep (Home Health) 1 x Per Day/30 Days Discharge Instructions: Use skin prep as directed Peri-Wound Care: powder or cream antifungal 1 x Per Day/30 Days Discharge Instructions:  may use powder or cream as needed for skin irritation. Prim Dressing: Dakin's Solution wet to dry (Home Health) 1 x Per Day/30 Days ary Discharge Instructions: Apply Dakin's moisten gauze to undermining and wound bed. Secondary Dressing: ABD Pad, 5x9 (Home Health) 1 x Per Day/30 Days Discharge Instructions: Apply over primary dressing. Secured With: Paper Tape, 2x10 (in/yd) (Home Health) 1 x Per Day/30 Days Discharge Instructions: Secure dressing with tape as directed. 1. Dakin's wet-to-dry 2. Aggressively offload the wound bed 3. Follow-up in 1 month Electronic Signature(s) Signed: 01/01/2022 12:23:46 PM By: [name] DO Entered By: [name] on 01/01/2022 12:22:59 -------------------------------------------------------------------------------- HxROS Details Patient Name: Date of Service: LEE-ANNE, FLICKER 01/01/2022 11:00 A M Medical Record Number: [id number] Patient Account Number: [id number] Date of Birth/Sex: Treating RN: Sep 08, 1961 (61 y.o. [name] Primary Care Provider: [name] Other Clinician: Referring Provider: Treating Provider/Extender: [name] in  Treatment: 28 Information Obtained From Patient Hematologic/Lymphatic Medical History: Positive for: Anemia Endocrine Medical History: Positive for: Type I Diabetes - s Time with diabetes: 70 years Treated with: Insulin Blood sugar tested every day: Yes Tested : constant with device Genitourinary Medical History: Positive for: End Stage Renal Disease - off dialysis since transplants Immunological Medical History: Negative for: Lupus Erythematosus; Raynauds; Scleroderma Integumentary (Skin) Medical History: Negative for: History of Burn Musculoskeletal Medical History: Positive for: Osteoarthritis; Osteomyelitis Neurologic Medical History: Positive for: Neuropathy Oncologic Medical History: Positive for: Received Radiation Negative for: Received  Chemotherapy Immunizations Pneumococcal Vaccine: Received Pneumococcal Vaccination: Yes Received Pneumococcal Vaccination On or After 60th Birthday: No Immunization Notes: states being up to date on all - unknown dates for immuization Implantable Devices None Hospitalization / Surgery History Type of Hospitalization/Surgery infected wound (pressure) 2022 (R) BKA amp Oct 2021 UTI 12/22-12/27/2022 Family and Social History Cancer: Yes - Father; Diabetes: Yes - Father; Heart Disease: Yes - Maternal Grandparents; Hereditary Spherocytosis: No; Hypertension: Yes - Mother; Kidney Disease: No; Lung Disease: No; Seizures: No; Stroke: No; Thyroid Problems: Yes - Mother,Siblings; Tuberculosis: No; Former smoker - from teens to 83's; Marital Status - Married; Alcohol Use: Rarely - beer; Drug Use: No History; Caffeine Use: Daily - coffee; Financial Concerns: No; Food, Clothing or Shelter Needs: No; Support System Lacking: No; Transportation Concerns: No Electronic Signature(s) Signed: 01/01/2022 12:23:46 PM By: [name] DO Signed: 01/04/2022 4:36:05 PM By: [name] RN, BSN Previous Signature: 01/01/2022 11:40:57 AM Version By: [name] RN, BSN Entered By: [name] on 01/01/2022 12:20:23 -------------------------------------------------------------------------------- [place] Details Patient Name: Date of Service: ARGUSTA, MCGANN 01/01/2022 Medical Record Number: [id number] Patient Account Number: [id number] Date of Birth/Sex: Treating RN: [birth year] (61 y.o. [name] Primary Care Provider: [name] Other Clinician: Referring Provider: Treating Provider/Extender: [name]: 28 Diagnosis Coding ICD-10 Codes Code Description E10.622 Type 1 diabetes mellitus with other skin ulcer L89.153 Pressure ulcer of sacral region, stage 3 Z89.511 Acquired absence of right leg below knee Z94.0 Kidney transplant  status Facility Procedures CPT4 Code: [id number] Description: 99213 - WOUND CARE VISIT-LEV 3 EST PT Modifier: Quantity: 1 Physician Procedures : CPT4 Code Description Modifier [id number] - WC PHYS LEVEL 3 - EST PT ICD-10 Diagnosis Description L89.153 Pressure ulcer of sacral region, stage 3 E10.622 Type 1 diabetes mellitus with other skin ulcer Z89.511 Acquired absence of right leg below  knee Z94.0 Kidney transplant status Quantity: 1 Electronic Signature(s) Signed: 01/01/2022 12:23:46 PM By: [name] DO Previous Signature: 01/01/2022 11:40:57 AM Version By: [name] RN, BSN Entered By: [name] on 01/01/2022 12:23:21

## 2022-01-12 ENCOUNTER — Other Ambulatory Visit: Payer: Self-pay | Admitting: Infectious Disease

## 2022-01-26 ENCOUNTER — Emergency Department (HOSPITAL_COMMUNITY)
Admission: EM | Admit: 2022-01-26 | Discharge: 2022-01-26 | Disposition: A | Discharge status: Home / Self Care | Payer: BC Managed Care – PPO | Attending: Emergency Medicine | Admitting: Emergency Medicine

## 2022-01-26 ENCOUNTER — Encounter (HOSPITAL_COMMUNITY): Payer: Self-pay

## 2022-01-26 DIAGNOSIS — Z7982 Long term (current) use of aspirin: Secondary | ICD-10-CM | POA: Insufficient documentation

## 2022-01-26 DIAGNOSIS — T83021A Displacement of indwelling urethral catheter, initial encounter: Secondary | ICD-10-CM | POA: Diagnosis present

## 2022-01-26 DIAGNOSIS — Z794 Long term (current) use of insulin: Secondary | ICD-10-CM | POA: Diagnosis not present

## 2022-01-26 DIAGNOSIS — Z79899 Other long term (current) drug therapy: Secondary | ICD-10-CM | POA: Insufficient documentation

## 2022-01-26 DIAGNOSIS — T839XXA Unspecified complication of genitourinary prosthetic device, implant and graft, initial encounter: Secondary | ICD-10-CM

## 2022-01-26 NOTE — ED Provider Notes (Signed)
[facility] DEPT Provider Note   CSN: [id number] Arrival date & time: 01/26/22  2011     History Chief Complaint  Patient presents with   Catether Dislodged     [name] is a 61 y.o. female who presents to the emergency department with a catheter problem.  Patient states that her catheter fell out.  Likely the balloon deflated.  No other complaints.  She has catheter in place as she recently lost her leg last year and is having trouble getting to the bathroom.  HPI     Home Medications Prior to Admission medications   Medication Sig Start Date End Date Taking? Authorizing Provider  acetaminophen (TYLENOL) 325 MG tablet Take 2 tablets (650 mg total) by mouth every 6 (six) hours as needed for mild pain (or Fever >/= 101). Patient taking differently: Take 650 mg by mouth every 6 (six) hours as needed for mild pain or fever. 10/06/20   Nita Sells, MD  ascorbic acid (VITAMIN C) 250 MG tablet Take 1 tablet (250 mg total) by mouth 2 (two) times daily. 12/22/21   Eugenie Filler, MD  aspirin EC 81 MG tablet Take 81 mg by mouth daily. Swallow whole.    [provider]  atorvastatin (LIPITOR) 10 MG tablet Take 1 tablet (10 mg total) by mouth daily. 03/02/21   Georgette Shell, MD  azaTHIOprine (IMURAN) 50 MG tablet Take 1 tablet (50 mg total) by mouth daily. 03/02/21   Georgette Shell, MD  B Complex-C-Folic Acid TABS Take 1 tablet by mouth daily.    [provider]  cholestyramine (QUESTRAN) 4 g packet Take 1 packet (4 g total) by mouth 2 (two) times daily. 03/02/21   Georgette Shell, MD  collagenase (SANTYL) ointment Apply topically daily. Patient taking differently: Apply 1 application topically daily. 03/02/21   Georgette Shell, MD  cycloSPORINE modified (NEORAL) 25 MG capsule Take 75 mg by mouth 2 (two) times daily. 12/13/21   [provider]  HYDROcodone bit-homatropine (HYCODAN) 5-1.5 MG/5ML syrup  Take 5 mLs by mouth every 6 (six) hours as needed for cough. 12/22/21   Eugenie Filler, MD  insulin lispro (HUMALOG) 100 UNIT/ML injection Inject 0.02 mLs (2 Units total) into the skin 3 (three) times daily before meals. Sliding scale if 200-250=2 units;251-300=4units;301-350=6 units;351-400=8 units;104-450=10 units;451-500=12 units subcutaneously before meals and at bedtime for DM Patient taking differently: Inject 2-12 Units into the skin 3 (three) times daily before meals. Sliding scale if 200-250=2 units;251-300=4units;301-350=6 units;351-400=8 units;104-450=10 units;451-500=12 units subcutaneously before meals and at bedtime for DM Sliding scale ; via pump 03/02/21   Georgette Shell, MD  ketoconazole (NIZORAL) 2 % cream Apply 1 application topically daily. 11/26/21   [provider]  levothyroxine (SYNTHROID) 88 MCG tablet Take 1 tablet (88 mcg total) by mouth daily before breakfast. 03/02/21   Georgette Shell, MD  lisinopril (ZESTRIL) 5 MG tablet Take 5 mg by mouth daily. 12/19/19   [provider]  liver oil-zinc oxide (DESITIN) 40 % ointment Apply topically as needed for irritation. Patient taking differently: Apply 1 application topically daily as needed for irritation. 03/02/21   Georgette Shell, MD  loperamide (IMODIUM) 2 MG capsule Take 1 capsule (2 mg total) by mouth as needed for diarrhea or loose stools. Patient taking differently: Take 2 mg by mouth daily as needed for diarrhea or loose stools. 03/02/21   Georgette Shell, MD  ondansetron (ZOFRAN-ODT) 4 MG disintegrating tablet Take  1 tablet (4 mg total) by mouth every 4 (four) hours as needed for nausea or vomiting. 03/02/21   Georgette Shell, MD  oxyCODONE (OXY IR/ROXICODONE) 5 MG immediate release tablet Take 1 tablet (5 mg total) by mouth every 6 (six) hours as needed for severe pain. 03/02/21   Georgette Shell, MD  pantoprazole (PROTONIX) 40 MG tablet Take 1 tablet (40 mg total) by mouth 2 (two)  times daily. Take 1 tablet twice daily x1 month, then 1 tablet daily thereafter. 12/22/21   Eugenie Filler, MD  predniSONE (DELTASONE) 5 MG tablet Take 1 tablet (5 mg total) by mouth daily with breakfast. 03/02/21   Georgette Shell, MD  saccharomyces boulardii (FLORASTOR) 250 MG capsule Take 1 capsule (250 mg total) by mouth 2 (two) times daily. Patient not taking: Reported on 12/17/2021 03/02/21   Georgette Shell, MD  sertraline (ZOLOFT) 50 MG tablet Take 50 mg by mouth daily.    [provider]  sodium hypochlorite (DAKIN'S 1/2 STRENGTH) external solution Apply 1 application topically daily. 05/14/21   [provider]  sucralfate (CARAFATE) 1 GM/10ML suspension Take 10 mLs (1 g total) by mouth 4 (four) times daily -  with meals and at bedtime. 12/22/21 01/21/22  Eugenie Filler, MD  triamcinolone (NASACORT) 55 MCG/ACT AERO nasal inhaler USE 1 SPRAY IN [facility] NOSTRIL Patient not taking: Reported on 12/17/2021 07/13/21   Rozetta Nunnery, MD      Allergies    Patient has no known allergies.    Review of Systems   Review of Systems  All other systems reviewed and are negative.  Physical Exam Updated Vital Signs BP 140/79    Pulse 84    Temp 98.3 F (36.8 C) (Oral)    Resp 16    SpO2 97%  Physical Exam Vitals and nursing note reviewed.  Constitutional:      Appearance: Normal appearance.  HENT:     Head: Normocephalic and atraumatic.  Eyes:     General:        Right eye: No discharge.        Left eye: No discharge.     Conjunctiva/sclera: Conjunctivae normal.  Pulmonary:     Effort: Pulmonary effort is normal.  Skin:    General: Skin is warm and dry.     Findings: No rash.  Neurological:     General: No focal deficit present.     Mental Status: She is alert.  Psychiatric:        Mood and Affect: Mood normal.        Behavior: Behavior normal.    ED Results / Procedures / Treatments   Labs (all labs ordered are listed, but only abnormal  results are displayed) Labs Reviewed - No data to display  EKG None  Radiology No results found.  Procedures Procedures    Medications Ordered in ED Medications - No data to display  ED Course/ Medical Decision Making/ A&P                           Medical Decision Making  [name] is a 61 y.o. female who presents to the emergency department with a catheter problem.  We will plan to put her back in a treatment room and replace her catheter.  No other complaints.  Catheter was placed by nursing staff.  She is safe for discharge.  Final Clinical Impression(s) / ED Diagnoses Final diagnoses:  Problem with Foley catheter, initial encounter [facility])    Rx / DC Orders ED Discharge Orders     None         Cherrie Gauze 01/26/22 2203    Regan Lemming, MD 01/27/22 [phone]

## 2022-01-26 NOTE — Discharge Instructions (Addendum)
Please return to the emergency department for worsening symptoms.

## 2022-01-26 NOTE — ED Triage Notes (Signed)
Patient arrives from home, states catheter came out tonight, Denies any other symptoms, requesting new catheter be placed.

## 2022-01-28 ENCOUNTER — Encounter (HOSPITAL_BASED_OUTPATIENT_CLINIC_OR_DEPARTMENT_OTHER): Payer: BC Managed Care – PPO | Admitting: Internal Medicine

## 2022-02-04 ENCOUNTER — Other Ambulatory Visit: Payer: Self-pay

## 2022-02-04 ENCOUNTER — Encounter (HOSPITAL_BASED_OUTPATIENT_CLINIC_OR_DEPARTMENT_OTHER): Payer: BC Managed Care – PPO | Attending: Internal Medicine | Admitting: Internal Medicine

## 2022-02-04 DIAGNOSIS — E10622 Type 1 diabetes mellitus with other skin ulcer: Secondary | ICD-10-CM | POA: Diagnosis not present

## 2022-02-04 DIAGNOSIS — Z940 Kidney transplant status: Secondary | ICD-10-CM | POA: Insufficient documentation

## 2022-02-04 DIAGNOSIS — Z89511 Acquired absence of right leg below knee: Secondary | ICD-10-CM | POA: Insufficient documentation

## 2022-02-04 DIAGNOSIS — L89153 Pressure ulcer of sacral region, stage 3: Secondary | ICD-10-CM | POA: Diagnosis present

## 2022-02-04 DIAGNOSIS — E109 Type 1 diabetes mellitus without complications: Secondary | ICD-10-CM | POA: Insufficient documentation

## 2022-02-04 NOTE — Progress Notes (Signed)
Doris, Lopez ([id number]) Visit Report for 02/04/2022 Arrival Information Details Patient Name: Date of Service: Doris, Lopez 02/04/2022 2:30 PM Medical Record Number: [id number] Patient Account Number: [id number] Date of Birth/Sex: Treating RN: 05-Jan-1961 (61 y.o. Nancy Fetter Primary Care Nanami Whitelaw: [name] Other Clinician: Referring Belvin Gauss: Treating Franziska Podgurski/Extender: [name] in Treatment: 57 Visit Information History Since Last Visit Added or deleted any medications: No Patient Arrived: Wheel Chair Any new allergies or adverse reactions: No Arrival Time: 14:49 Had a fall or experienced change in No Accompanied By: husband activities of daily living that may affect Transfer Assistance: EasyPivot Patient Lift risk of falls: Patient Identification Verified: Yes Signs or symptoms of abuse/neglect since last visito No Secondary Verification Process Completed: Yes Hospitalized since last visit: No Patient Requires Transmission-Based Precautions: No Implantable device outside of the clinic excluding No Patient Has Alerts: No cellular tissue based products placed in the center since last visit: Has Dressing in Place as Prescribed: Yes Pain Present Now: No Electronic Signature(s) Signed: 02/04/2022 3:24:34 PM By: Sandre Kitty Entered By: Sandre Kitty on 02/04/2022 14:50:50 -------------------------------------------------------------------------------- Clinic Level of Care Assessment Details Patient Name: Date of Service: Doris, Lopez 02/04/2022 2:30 PM Medical Record Number: [id number] Patient Account Number: [id number] Date of Birth/Sex: Treating RN: 1961/01/08 (61 y.o. [name] Primary Care Shonda Mandarino: [name] Other Clinician: Referring Nikai Quest: Treating Heyward Douthit/Extender: [name] in Treatment: 13 Clinic Level of Care Assessment Items TOOL 4 Quantity Score X- 1  0 Use when only an EandM is performed on FOLLOW-UP visit ASSESSMENTS - Nursing Assessment / Reassessment X- 1 10 Reassessment of Co-morbidities (includes updates in patient status) X- 1 5 Reassessment of Adherence to Treatment Plan ASSESSMENTS - Wound and Skin A ssessment / Reassessment X - Simple Wound Assessment / Reassessment - one wound 1 5 []  - 0 Complex Wound Assessment / Reassessment - multiple wounds []  - 0 Dermatologic / Skin Assessment (not related to wound area) ASSESSMENTS - Focused Assessment []  - 0 Circumferential Edema Measurements - multi extremities []  - 0 Nutritional Assessment / Counseling / Intervention []  - 0 Lower Extremity Assessment (monofilament, tuning fork, pulses) []  - 0 Peripheral Arterial Disease Assessment (using hand held doppler) ASSESSMENTS - Ostomy and/or Continence Assessment and Care []  - 0 Incontinence Assessment and Management []  - 0 Ostomy Care Assessment and Management (repouching, etc.) PROCESS - Coordination of Care []  - 0 Simple Patient / Family Education for ongoing care X- 1 20 Complex (extensive) Patient / Family Education for ongoing care []  - 0 Staff obtains Programmer, systems, Records, T Results / Process Orders est []  - 0 Staff telephones HHA, Nursing Homes / Clarify orders / etc []  - 0 Routine Transfer to another Facility (non-emergent condition) []  - 0 Routine Hospital Admission (non-emergent condition) []  - 0 New Admissions / Biomedical engineer / Ordering NPWT Apligraf, etc. , []  - 0 Emergency Hospital Admission (emergent condition) []  - 0 Simple Discharge Coordination []  - 0 Complex (extensive) Discharge Coordination PROCESS - Special Needs []  - 0 Pediatric / Minor Patient Management []  - 0 Isolation Patient Management []  - 0 Hearing / Language / Visual special needs []  - 0 Assessment of Community assistance (transportation, D/C planning, etc.) []  - 0 Additional assistance / Altered mentation []  -  0 Support Surface(s) Assessment (bed, cushion, seat, etc.) INTERVENTIONS - Wound Cleansing / Measurement X - Simple Wound Cleansing - one wound 1 5 []  - 0 Complex Wound Cleansing - multiple wounds X- 1 5  Wound Imaging (photographs - any number of wounds) []  - 0 Wound Tracing (instead of photographs) X- 1 5 Simple Wound Measurement - one wound []  - 0 Complex Wound Measurement - multiple wounds INTERVENTIONS - Wound Dressings []  - 0 Small Wound Dressing one or multiple wounds X- 1 15 Medium Wound Dressing one or multiple wounds []  - 0 Large Wound Dressing one or multiple wounds []  - 0 Application of Medications - topical []  - 0 Application of Medications - injection INTERVENTIONS - Miscellaneous []  - 0 External ear exam []  - 0 Specimen Collection (cultures, biopsies, blood, body fluids, etc.) []  - 0 Specimen(s) / Culture(s) sent or taken to Lab for analysis []  - 0 Patient Transfer (multiple staff / Civil Service fast streamer / Similar devices) []  - 0 Simple Staple / Suture removal (25 or less) []  - 0 Complex Staple / Suture removal (26 or more) []  - 0 Hypo / Hyperglycemic Management (close monitor of Blood Glucose) []  - 0 Ankle / Brachial Index (ABI) - do not check if billed separately X- 1 5 Vital Signs Has the patient been seen at the hospital within the last three years: Yes Total Score: 75 Level Of Care: New/Established - Level 2 Electronic Signature(s) Signed: 02/04/2022 5:34:05 PM By: [name] Entered By: [name] on 02/04/2022 15:15:38 -------------------------------------------------------------------------------- Encounter Discharge Information Details Patient Name: Date of Service: Doris, Lopez 02/04/2022 2:30 PM Medical Record Number: [id number] Patient Account Number: [id number] Date of Birth/Sex: Treating RN: 12/12/1961 (61 y.o. [name] Primary Care Icker Swigert: [name] Other Clinician: Referring Valoree Agent: Treating Lakeyia Surber/Extender:  [name] in Treatment: 48 Encounter Discharge Information Items Discharge Condition: Stable Ambulatory Status: Wheelchair Discharge Destination: Home Transportation: Private Auto Accompanied By: Husband Schedule Follow-up Appointment: Yes Clinical Summary of Care: Provided on 02/04/2022 Form Type Recipient Paper Patient Patient Electronic Signature(s) Signed: 02/04/2022 3:29:23 PM By: [name] Entered By: [name] on 02/04/2022 15:29:23 -------------------------------------------------------------------------------- Lower Extremity Assessment Details Patient Name: Date of Service: Doris, Lopez 02/04/2022 2:30 PM Medical Record Number: [id number] Patient Account Number: [id number] Date of Birth/Sex: Treating RN: 1961-08-23 (61 y.o. [name] Primary Care Sherrina Zaugg: [name] Other Clinician: Referring Moriya Mitchell: Treating Jaquavis Felmlee/Extender: [name]: 32 Electronic Signature(s) Signed: 02/04/2022 5:34:05 PM By: [name] Entered By: [name] on 02/04/2022 15:04:38 -------------------------------------------------------------------------------- Multi Wound Chart Details Patient Name: Date of Service: Doris, Lopez 02/04/2022 2:30 PM Medical Record Number: [id number] Patient Account Number: [id number] Date of Birth/Sex: Treating RN: [birth year] (61 y.o. Nancy Fetter Primary Care Antanisha Mohs: [name] Other Clinician: Referring Seniya Stoffers: Treating Jaylise Peek/Extender: [name]: 32 Vital Signs Height(in): 53 Pulse(bpm): 79 Weight(lbs): 110 Blood Pressure(mmHg): 182/92 Body Mass Index(BMI): 27.5 Temperature(F): 98.4 Respiratory Rate(breaths/min): 17 Photos: [N/A:N/A] Sacrum N/A N/A Wound Location: Pressure Injury N/A N/A Wounding Event: Pressure Ulcer N/A N/A Primary Etiology: Anemia, Type I Diabetes, End  Stage N/A N/A Comorbid History: Renal Disease, Osteoarthritis, Osteomyelitis, Neuropathy, Received Radiation 02/24/2021 N/A N/A Date Acquired: 64 N/A N/A Weeks of Treatment: Open N/A N/A Wound Status: No N/A N/A Wound Recurrence: 3.5x2x1.1 N/A N/A Measurements L x W x D (cm) 5.498 N/A N/A A (cm) : rea 6.048 N/A N/A Volume (cm) : 77.70% N/A N/A % Reduction in A rea: 83.60% N/A N/A % Reduction in Volume: 12 Starting Position 1 (o'clock): 3 Ending Position 1 (o'clock): 1.1 Maximum Distance 1 (cm): Yes N/A N/A Undermining: Category/Stage III N/A N/A Classification: Medium N/A N/A Exudate A  mount: Purulent N/A N/A Exudate Type: yellow, brown, green N/A N/A Exudate Color: Epibole N/A N/A Wound Margin: Large (67-100%) N/A N/A Granulation A mount: Red, Pink N/A N/A Granulation Quality: Small (1-33%) N/A N/A Necrotic A mount: Fat Layer (Subcutaneous Tissue): Yes N/A N/A Exposed Structures: Fascia: No Tendon: No Muscle: No Joint: No Bone: No Medium (34-66%) N/A N/A Epithelialization: Maceration noted N/A N/A Assessment Notes: Treatment Notes Wound #1 (Sacrum) Cleanser Wound Cleanser Discharge Instruction: Cleanse the wound with wound cleanser prior to applying a clean dressing using gauze sponges, not tissue or cotton balls. Peri-Wound Care Skin Prep Discharge Instruction: Use skin prep as directed powder or cream antifungal Discharge Instruction: may use powder or cream as needed for skin irritation. Topical Primary Dressing Dakin's Solution wet to dry Discharge Instruction: Apply Dakin's moisten gauze to undermining and wound bed. Secondary Dressing Woven Gauze Sponge, Non-Sterile 4x4 in Discharge Instruction: Moisten gauze with Dakin's ABD Pad, 5x9 Discharge Instruction: Apply over primary dressing. Secured With 67M Medipore H Soft Cloth Surgical T ape, 4 x 10 (in/yd) Discharge Instruction: Secure with tape as directed. Compression  Wrap Compression Stockings Add-Ons Electronic Signature(s) Signed: 02/04/2022 3:58:02 PM By: [name] DO Signed: 02/04/2022 6:05:40 PM By: Levan Hurst RN, BSN Entered By: [name] on 02/04/2022 15:51:49 -------------------------------------------------------------------------------- [place] Details Patient Name: Date of Service: Doris, Lopez 02/04/2022 2:30 PM Medical Record Number: [id number] Patient Account Number: [id number] Date of Birth/Sex: Treating RN: 01/23/1961 (61 y.o. [name] Primary Care Oran Dillenburg: [name] Other Clinician: Referring Carlton Buskey: Treating Meelah Tallo/Extender: [name] in Treatment: 60 Active Inactive Wound/Skin Impairment Nursing Diagnoses: Impaired tissue integrity Knowledge deficit related to ulceration/compromised skin integrity Goals: Patient/caregiver will verbalize understanding of skin care regimen Date Initiated: 06/19/2021 Target Resolution Date: 02/26/2022 Goal Status: Active Ulcer/skin breakdown will have a volume reduction of 30% by week 4 Date Initiated: 06/19/2021 Date Inactivated: 07/27/2021 Target Resolution Date: 07/02/2021 Goal Status: Unmet Unmet Reason: pressure relief Interventions: Assess patient/caregiver ability to obtain necessary supplies Assess patient/caregiver ability to perform ulcer/skin care regimen upon admission and as needed Assess ulceration(s) every visit Provide education on ulcer and skin care Notes: Electronic Signature(s) Signed: 02/04/2022 5:34:05 PM By: [name] Entered By: [name] on 02/04/2022 15:05:54 -------------------------------------------------------------------------------- Pain Assessment Details Patient Name: Date of Service: Doris, Lopez 02/04/2022 2:30 PM Medical Record Number: [id number] Patient Account Number: [id number] Date of Birth/Sex: Treating RN: 1961-04-24 (62 y.o. Nancy Fetter Primary Care Vendela Troung: [name] Other Clinician: Referring Onisha Cedeno: Treating Nayelis Bonito/Extender: [name]: 75 Active Problems Location of Pain Severity and Description of Pain Patient Has Paino No Site Locations Pain Management and Medication Current Pain Management: Electronic Signature(s) Signed: 02/04/2022 3:24:34 PM By: Sandre Kitty Signed: 02/04/2022 6:05:40 PM By: Levan Hurst RN, BSN Entered By: Sandre Kitty on 02/04/2022 14:51:19 -------------------------------------------------------------------------------- Patient/Caregiver Education Details Patient Name: Date of Service: AHNYLA, MENDEL 2/9/2023andnbsp2:30 PM Medical Record Number: [id number] Patient Account Number: [id number] Date of Birth/Gender: Treating RN: [birth year] (61 y.o. [name] Primary Care Physician: [name] Other Clinician: Referring Physician: Treating Physician/Extender: [name] in Treatment: 32 Education Assessment Education Provided To: Patient Education Topics Provided Pressure: Methods: Explain/Verbal, Printed Responses: State content correctly Wound/Skin Impairment: Methods: Explain/Verbal, Printed Responses: State content correctly Electronic Signature(s) Signed: 02/04/2022 5:34:05 PM By: [name] Entered By: [name] on 02/04/2022 15:06:19 -------------------------------------------------------------------------------- Wound Assessment Details Patient Name: Date of Service: Doris, Lopez 02/04/2022 2:30 PM Medical Record  Number: [id number] Patient Account Number: [id number] Date of Birth/Sex: Treating RN: [birth year] (61 y.o. Nancy Fetter Primary Care Tip Atienza: [name] Other Clinician: Referring Zenas Santa: Treating Jennilee Demarco/Extender: [name]: 32 Wound Status Wound Number: 1 Primary Pressure  Ulcer Etiology: Wound Location: Sacrum Wound Open Wounding Event: Pressure Injury Status: Date Acquired: 02/24/2021 Comorbid Anemia, Type I Diabetes, End Stage Renal Disease, Osteoarthritis, Weeks Of Treatment: 32 History: Osteomyelitis, Neuropathy, Received Radiation Clustered Wound: No Photos Wound Measurements Length: (cm) 3.5 Width: (cm) 2 Depth: (cm) 1.1 Area: (cm) 5.498 Volume: (cm) 6.048 % Reduction in Area: 77.7% % Reduction in Volume: 83.6% Epithelialization: Medium (34-66%) Tunneling: No Undermining: Yes Starting Position (o'clock): 12 Ending Position (o'clock): 3 Maximum Distance: (cm) 1.1 Wound Description Classification: Category/Stage III Wound Margin: Epibole Exudate Amount: Medium Exudate Type: Purulent Exudate Color: yellow, brown, green Foul Odor After Cleansing: No Slough/Fibrino Yes Wound Bed Granulation Amount: Large (67-100%) Exposed Structure Granulation Quality: Red, Pink Fascia Exposed: No Necrotic Amount: Small (1-33%) Fat Layer (Subcutaneous Tissue) Exposed: Yes Necrotic Quality: Adherent Slough Tendon Exposed: No Muscle Exposed: No Joint Exposed: No Bone Exposed: No Assessment Notes Maceration noted Treatment Notes Wound #1 (Sacrum) Cleanser Wound Cleanser Discharge Instruction: Cleanse the wound with wound cleanser prior to applying a clean dressing using gauze sponges, not tissue or cotton balls. Peri-Wound Care Skin Prep Discharge Instruction: Use skin prep as directed powder or cream antifungal Discharge Instruction: may use powder or cream as needed for skin irritation. Topical Primary Dressing Dakin's Solution wet to dry Discharge Instruction: Apply Dakin's moisten gauze to undermining and wound bed. Secondary Dressing Woven Gauze Sponge, Non-Sterile 4x4 in Discharge Instruction: Moisten gauze with Dakin's ABD Pad, 5x9 Discharge Instruction: Apply over primary dressing. Secured With 90M Medipore H Soft Cloth Surgical  T ape, 4 x 10 (in/yd) Discharge Instruction: Secure with tape as directed. Compression Wrap Compression Stockings Add-Ons Electronic Signature(s) Signed: 02/04/2022 5:34:05 PM By: [name] Signed: 02/04/2022 6:05:40 PM By: Levan Hurst RN, BSN Entered By: [name] on 02/04/2022 15:05:22 -------------------------------------------------------------------------------- [place] Details Patient Name: Date of Service: Doris, Lopez 02/04/2022 2:30 PM Medical Record Number: [id number] Patient Account Number: [id number] Date of Birth/Sex: Treating RN: 16-Feb-1961 (61 y.o. Nancy Fetter Primary Care Alda Gaultney: [name] Other Clinician: Referring Burnell Hurta: Treating Suzzanne Brunkhorst/Extender: [name]: 32 Vital Signs Time Taken: 14:50 Temperature (F): 98.4 Height (in): 53 Pulse (bpm): 79 Weight (lbs): 110 Respiratory Rate (breaths/min): 17 Body Mass Index (BMI): 27.5 Blood Pressure (mmHg): 182/92 Reference Range: 80 - 120 mg / dl Electronic Signature(s) Signed: 02/04/2022 3:24:34 PM By: Sandre Kitty Entered By: Sandre Kitty on 02/04/2022 14:51:11

## 2022-02-04 NOTE — Progress Notes (Signed)
VANISSA, STRENGTH ([id number]) Visit Report for 02/04/2022 Chief Complaint Document Details Patient Name: Date of Service: [name], [name] 02/04/2022 2:30 PM Medical Record Number: [id number] Patient Account Number: [id number] Date of Birth/Sex: Treating RN: [birth year] (61 y.o. [name] Primary Care Provider: Melissa Lopez Other Clinician: Referring Provider: Treating Provider/Extender: [name] [name]: 34 Information Obtained from: Patient Chief Complaint Sacral ulcer Electronic Signature(s) Signed: 02/04/2022 3:58:02 PM By: [name] DO Entered By: [name] on 02/04/2022 15:52:01 -------------------------------------------------------------------------------- HPI Details Patient Name: Date of Service: [name], [name] 02/04/2022 2:30 PM Medical Record Number: [id number] Patient Account Number: [id number] Date of Birth/Sex: Treating RN: 13-Oct-1961 (61 y.o. [name] Primary Care Provider: Melissa Lopez Other Clinician: Referring Provider: Treating Provider/Extender: [name] [name]: 45 History of Present Illness HPI Description: Admission 6/24 [name] is a 61 year old female with a past medical history of type 1 diabetes, right BKA, and kidney transplant that presents to the clinic for an 70-month history of sacral ulcer. She has been using wet-to-dry dressings up until 5 weeks ago when she was started on a wound VAC. 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 She has completed 6 weeks of treatment and states she has been discharged by infectious disease. She currently denies any issues to the wound area. She reports minimal pain. She denies signs of infection. 7/11; patient presents for 2-week follow-up. She uses a wound VAC that is changed with home health 3 times a week. She denies any signs of infection. She has no  complaints or issues today. 8/1; patient presents for follow-up. She continues to use a wound VAC and this is changed with home health. She reports pain to the sacrum that started 1 week ago. She denies systemic signs of infection. 8/15; patient presents for 2-week follow-up. She has been using wet-to-dry dressings daily. She reports improvement in the wound size and appearance. She denies infectious symptoms. 9/12; patient presents for follow-up. She has been using wet-to-dry Dakin's moistened gauze. She reports following up with infectious disease and is currently taking amoxicillin. 10/6; patient presents for follow-up. She has been using Dakin's wet-to-dry moistened gauze dressings. She is still taking amoxicillin. She canceled her appointment with infectious disease yesterday because she is tired of going to doctors appointments. She has not picked up gentamicin cream from the pharmacy to use on the wound bed. She currently denies signs of infection. 11/3; patient presents for follow-up. She has been using Dakin's wet-to-dry dressings. She has no issues or complaints today. She denies signs of infection. 12/1; patient presents for follow-up. She continues to use Dakin's wet-to-dry dressings. Patient followed up with Dr. Tommy Medal, ID and amoxicillin was stopped as she had completed her course for sacral osteomyelitis. She reports itching to the periwound. She denies signs of infection. 1/6; patient presents for follow-up. She has been using Dakin's wet-to-dry dressings. She never received the wound VAC. She reports improvement with ketoconazole to the periwound. She has no issues or complaints today. She denies signs of infection. Electronic Signature(s) Signed: 02/04/2022 3:58:02 PM By: [name] DO Entered By: [name] on 02/04/2022 15:52:23 -------------------------------------------------------------------------------- Physical Exam Details Patient Name: Date of  Service: [name], [name] 02/04/2022 2:30 PM Medical Record Number: [id number] Patient Account Number: [id number] Date of Birth/Sex: Treating RN: 1961-10-05 (61 y.o. [name] Primary Care Provider: Melissa Lopez Other Clinician: Referring Provider: Treating Provider/Extender: [name]  in Treatment: 32 Constitutional respirations regular, non-labored and within target range for patient.Marland Kitchen Psychiatric pleasant and cooperative. Notes Sacral region: Large open wound with granulation tissue present. Undermining circumferentially. No Irritation to the periwound. No signs of soft tissue infection. Electronic Signature(s) Signed: 02/04/2022 3:58:02 PM By: [name] DO Entered By: [name] on 02/04/2022 15:53:11 -------------------------------------------------------------------------------- Physician Orders Details Patient Name: Date of Service: [name], [name] 02/04/2022 2:30 PM Medical Record Number: [id number] Patient Account Number: [id number] Date of Birth/Sex: Treating RN: 1961-10-26 (61 y.o. [name] Primary Care Provider: Melissa Lopez Other Clinician: Referring Provider: Treating Provider/Extender: [name] in Treatment: 33 Verbal / Phone Orders: No Diagnosis Coding ICD-10 Coding Code Description E10.622 Type 1 diabetes mellitus with other skin ulcer L89.153 Pressure ulcer of sacral region, stage 3 Z89.511 Acquired absence of right leg below knee Z94.0 Kidney transplant status Follow-up Appointments Return appointment in 1 month. - Dr. Heber [place] (Room 8) Cellular or Tissue Based Products Cellular or Tissue Based Product Type: - Run IVR for Theraskin=Not Covered Bathing/ Shower/ Hygiene Do not shower or bathe in tub. Negative Presssure Wound Therapy Discontinue wound vac - No need for wound vac at this time. Off-Loading Turn and reposition every 2 hours Additional Orders /  Instructions Follow Nutritious Diet Wound Treatment Wound #1 - Sacrum Cleanser: Wound Cleanser (DME) (Generic) 1 x Per Day/30 Days Discharge Instructions: Cleanse the wound with wound cleanser prior to applying a clean dressing using gauze sponges, not tissue or cotton balls. Peri-Wound Care: Skin Prep (DME) (Generic) 1 x Per Day/30 Days Discharge Instructions: Use skin prep as directed Peri-Wound Care: powder or cream antifungal 1 x Per Day/30 Days Discharge Instructions: may use powder or cream as needed for skin irritation. Prim Dressing: Dakin's Solution wet to dry 1 x Per Day/30 Days ary Discharge Instructions: Apply Dakin's moisten gauze to undermining and wound bed. Secondary Dressing: Woven Gauze Sponge, Non-Sterile 4x4 in 1 x Per Day/30 Days Discharge Instructions: Moisten gauze with Dakin's Secondary Dressing: ABD Pad, 5x9 (DME) (Generic) 1 x Per Day/30 Days Discharge Instructions: Apply over primary dressing. Secured With: 47M Medipore H Soft Cloth Surgical T ape, 4 x 10 (in/yd) 1 x Per Day/30 Days Discharge Instructions: Secure with tape as directed. Electronic Signature(s) Signed: 02/04/2022 3:58:02 PM By: [name] DO Entered By: [name] on 02/04/2022 15:53:55 -------------------------------------------------------------------------------- Problem List Details Patient Name: Date of Service: AIRYANA, SPRUNGER 02/04/2022 2:30 PM Medical Record Number: [id number] Patient Account Number: [id number] Date of Birth/Sex: Treating RN: 09-13-1961 (61 y.o. [name] Primary Care Provider: Melissa Lopez Other Clinician: Referring Provider: Treating Provider/Extender: [name] [name]: 78 Active Problems ICD-10 Encounter Code Description Active Date MDM Diagnosis E10.622 Type 1 diabetes mellitus with other skin ulcer 10/29/2021 No Yes L89.153 Pressure ulcer of sacral region, stage 3 06/19/2021 No Yes Z89.511 Acquired  absence of right leg below knee 06/19/2021 No Yes Z94.0 Kidney transplant status 06/19/2021 No Yes Inactive Problems Resolved Problems Electronic Signature(s) Signed: 02/04/2022 3:58:02 PM By: [name] DO Entered By: [name] on 02/04/2022 15:51:42 -------------------------------------------------------------------------------- Progress Note Details Patient Name: Date of Service: [name], [name] 02/04/2022 2:30 PM Medical Record Number: [id number] Patient Account Number: [id number] Date of Birth/Sex: Treating RN: 17-Jun-1961 (61 y.o. [name] Primary Care Provider: Melissa Lopez Other Clinician: Referring Provider: Treating Provider/Extender: [name] in Treatment: 28 Subjective Chief Complaint Information obtained from Patient Sacral ulcer History of Present Illness (HPI) Admission 6/24 Ms. Doris Cory  Lopez is a 61 year old female with a past medical history of type 1 diabetes, right BKA, and kidney transplant that presents to the clinic for an 40-month history of sacral ulcer. She has been using wet-to-dry dressings up until 5 weeks ago when she was started on a wound VAC. 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 She has completed 6 weeks of treatment and states she has been discharged by infectious disease. She currently denies any issues to the wound area. She reports minimal pain. She denies signs of infection. 7/11; patient presents for 2-week follow-up. She uses a wound VAC that is changed with home health 3 times a week. She denies any signs of infection. She has no complaints or issues today. 8/1; patient presents for follow-up. She continues to use a wound VAC and this is changed with home health. She reports pain to the sacrum that started 1 week ago. She denies systemic signs of infection. 8/15; patient presents for 2-week follow-up. She has been using wet-to-dry  dressings daily. She reports improvement in the wound size and appearance. She denies infectious symptoms. 9/12; patient presents for follow-up. She has been using wet-to-dry Dakin's moistened gauze. She reports following up with infectious disease and is currently taking amoxicillin. 10/6; patient presents for follow-up. She has been using Dakin's wet-to-dry moistened gauze dressings. She is still taking amoxicillin. She canceled her appointment with infectious disease yesterday because she is tired of going to doctors appointments. She has not picked up gentamicin cream from the pharmacy to use on the wound bed. She currently denies signs of infection. 11/3; patient presents for follow-up. She has been using Dakin's wet-to-dry dressings. She has no issues or complaints today. She denies signs of infection. 12/1; patient presents for follow-up. She continues to use Dakin's wet-to-dry dressings. Patient followed up with Dr. Tommy Medal, ID and amoxicillin was stopped as she had completed her course for sacral osteomyelitis. She reports itching to the periwound. She denies signs of infection. 1/6; patient presents for follow-up. She has been using Dakin's wet-to-dry dressings. She never received the wound VAC. She reports improvement with ketoconazole to the periwound. She has no issues or complaints today. She denies signs of infection. Patient History Information obtained from Patient. Family History Cancer - Father, Diabetes - Father, Heart Disease - Maternal Grandparents, Hypertension - Mother, Thyroid Problems - Mother,Siblings, No family history of Hereditary Spherocytosis, Kidney Disease, Lung Disease, Seizures, Stroke, Tuberculosis. Social History Former smoker - from teens to 69's, Marital Status - Married, Alcohol Use - Rarely - beer, Drug Use - No History, Caffeine Use - Daily - coffee. Medical History Hematologic/Lymphatic Patient has history of Anemia Endocrine Patient has history of  Type I Diabetes - s Genitourinary Patient has history of End Stage Renal Disease - off dialysis since transplants Immunological Denies history of Lupus Erythematosus, Raynaudoos, Scleroderma Integumentary (Skin) Denies history of History of Burn Musculoskeletal Patient has history of Osteoarthritis, Osteomyelitis Neurologic Patient has history of Neuropathy Oncologic Patient has history of Received Radiation Denies history of Received Chemotherapy Hospitalization/Surgery History - infected wound (pressure) 2022. - (R) BKA amp Oct 2021. - UTI 12/22-12/27/2022. Objective Constitutional respirations regular, non-labored and within target range for patient.. Vitals Time Taken: 2:50 PM, Height: 53 in, Weight: 110 lbs, BMI: 27.5, Temperature: 98.4 F, Pulse: 79 bpm, Respiratory Rate: 17 breaths/min, Blood Pressure: 182/92 mmHg. Psychiatric pleasant and cooperative. General Notes: Sacral region: Large open wound with granulation tissue present. Undermining circumferentially. No  Irritation to the periwound. No signs of soft tissue infection. Integumentary (Hair, Skin) Wound #1 status is Open. Original cause of wound was Pressure Injury. The date acquired was: 02/24/2021. The wound has been in treatment 32 weeks. The wound is located on the Sacrum. The wound measures 3.5cm length x 2cm width x 1.1cm depth; 5.498cm^2 area and 6.048cm^3 volume. There is Fat Layer (Subcutaneous Tissue) exposed. There is no tunneling noted, however, there is undermining starting at 12:00 and ending at 3:00 with a maximum distance of 1.1cm. There is a medium amount of purulent drainage noted. The wound margin is epibole. There is large (67-100%) red, pink granulation within the wound bed. There is a small (1-33%) amount of necrotic tissue within the wound bed including Adherent Slough. General Notes: Maceration noted Assessment Active Problems ICD-10 Type 1 diabetes mellitus with other skin ulcer Pressure ulcer  of sacral region, stage 3 Acquired absence of right leg below knee Kidney transplant status Patient's wound is stable. 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 No signs of surrounding infection. She is using Dakin's wet-to-dry dressings. I recommended continuing this Along with aggressive offloading. Home health is no longer coming to see the patient. We will send supplies through our wound care supplier. Plan Follow-up Appointments: Return appointment in 1 month. - Dr. Heber [place] (Room 8) Cellular or Tissue Based Products: Cellular or Tissue Based Product Type: - Run IVR for Theraskin=Not Covered Bathing/ Shower/ Hygiene: Do not shower or bathe in tub. Negative Presssure Wound Therapy: Discontinue wound vac - No need for wound vac at this time. Off-Loading: Turn and reposition every 2 hours Additional Orders / Instructions: Follow Nutritious Diet WOUND #1: - Sacrum Wound Laterality: Cleanser: Wound Cleanser (DME) (Generic) 1 x Per Day/30 Days Discharge Instructions: Cleanse the wound with wound cleanser prior to applying a clean dressing using gauze sponges, not tissue or cotton balls. Peri-Wound Care: Skin Prep (DME) (Generic) 1 x Per Day/30 Days Discharge Instructions: Use skin prep as directed Peri-Wound Care: powder or cream antifungal 1 x Per Day/30 Days Discharge Instructions: may use powder or cream as needed for skin irritation. Prim Dressing: Dakin's Solution wet to dry 1 x Per Day/30 Days ary Discharge Instructions: Apply Dakin's moisten gauze to undermining and wound bed. Secondary Dressing: Woven Gauze Sponge, Non-Sterile 4x4 in 1 x Per Day/30 Days Discharge Instructions: Moisten gauze with Dakin's Secondary Dressing: ABD Pad, 5x9 (DME) (Generic) 1 x Per Day/30 Days Discharge Instructions: Apply over primary dressing. Secured With: 10M Medipore H Soft Cloth Surgical T ape, 4 x 10 (in/yd) 1 x Per Day/30 Days Discharge Instructions: Secure with tape as directed. 1.  Dakin's wet-to-dry dressings 2. Aggressive offloading Electronic Signature(s) Signed: 02/04/2022 3:58:02 PM By: [name] DO Entered By: [name] on 02/04/2022 15:56:18 -------------------------------------------------------------------------------- HxROS Details Patient Name: Date of Service: [name], [name] 02/04/2022 2:30 PM Medical Record Number: [id number] Patient Account Number: [id number] Date of Birth/Sex: Treating RN: 1961/04/24 (61 y.o. [name] Primary Care Provider: Melissa Lopez Other Clinician: Referring Provider: Treating Provider/Extender: [name] in Treatment: 72 Information Obtained From Patient Hematologic/Lymphatic Medical History: Positive for: Anemia Endocrine Medical History: Positive for: Type I Diabetes - s Time with diabetes: 22 years Treated with: Insulin Blood sugar tested every day: Yes Tested : constant with device Genitourinary Medical History: Positive for: End Stage Renal Disease - off dialysis since transplants Immunological Medical History: Negative for: Lupus Erythematosus; Raynauds; Scleroderma Integumentary (Skin) Medical History: Negative for: History of Burn Musculoskeletal Medical  History: Positive for: Osteoarthritis; Osteomyelitis Neurologic Medical History: Positive for: Neuropathy Oncologic Medical History: Positive for: Received Radiation Negative for: Received Chemotherapy Immunizations Pneumococcal Vaccine: Received Pneumococcal Vaccination: Yes Received Pneumococcal Vaccination On or After 60th Birthday: No Immunization Notes: states being up to date on all - unknown dates for immuization Implantable Devices None Hospitalization / Surgery History Type of Hospitalization/Surgery infected wound (pressure) 2022 (R) BKA amp Oct 2021 UTI 12/22-12/27/2022 Family and Social History Cancer: Yes - Father; Diabetes: Yes - Father; Heart Disease: Yes - Maternal  Grandparents; Hereditary Spherocytosis: No; Hypertension: Yes - Mother; Kidney Disease: No; Lung Disease: No; Seizures: No; Stroke: No; Thyroid Problems: Yes - Mother,Siblings; Tuberculosis: No; Former smoker - from teens to 30's; Marital Status - Married; Alcohol Use: Rarely - beer; Drug Use: No History; Caffeine Use: Daily - coffee; Financial Concerns: No; Food, Clothing or Shelter Needs: No; Support System Lacking: No; Transportation Concerns: No Electronic Signature(s) Signed: 02/04/2022 3:58:02 PM By: [name] DO Signed: 02/04/2022 6:05:40 PM By: Levan Hurst RN, BSN Entered By: [name] on 02/04/2022 15:52:31 -------------------------------------------------------------------------------- [place] Details Patient Name: Date of Service: [name], [name] 02/04/2022 Medical Record Number: [id number] Patient Account Number: [id number] Date of Birth/Sex: Treating RN: 04/21/1961 (61 y.o. [name] Primary Care Provider: Melissa Lopez Other Clinician: Referring Provider: Treating Provider/Extender: [name] [name]: 32 Diagnosis Coding ICD-10 Codes Code Description E10.622 Type 1 diabetes mellitus with other skin ulcer L89.153 Pressure ulcer of sacral region, stage 3 Z89.511 Acquired absence of right leg below knee Z94.0 Kidney transplant status Facility Procedures CPT4 Code: [id number] Description: 66599 - WOUND CARE VISIT-LEV 2 EST PT Modifier: Quantity: 1 Physician Procedures Electronic Signature(s) Signed: 02/04/2022 3:58:02 PM By: [name] DO Entered By: [name] on 02/04/2022 15:57:45

## 2022-02-09 ENCOUNTER — Telehealth: Payer: Self-pay | Admitting: Orthopedic Surgery

## 2022-02-09 NOTE — Telephone Encounter (Signed)
Patient called advised  [name] with [facility] asked her to call and be set up for (PT)   Patient said she lives off of [address].   The number to contact patient is [phone]

## 2022-02-10 ENCOUNTER — Other Ambulatory Visit: Payer: Self-pay | Admitting: Orthopedic Surgery

## 2022-02-10 DIAGNOSIS — Z89511 Acquired absence of right leg below knee: Secondary | ICD-10-CM

## 2022-02-10 NOTE — Telephone Encounter (Signed)
SW pt, she is aware that we will refer her to our PT dept in office.

## 2022-02-17 ENCOUNTER — Encounter: Payer: BC Managed Care – PPO | Admitting: Physical Therapy

## 2022-03-04 ENCOUNTER — Ambulatory Visit (HOSPITAL_BASED_OUTPATIENT_CLINIC_OR_DEPARTMENT_OTHER): Payer: BC Managed Care – PPO | Admitting: Internal Medicine

## 2022-03-08 ENCOUNTER — Encounter (HOSPITAL_BASED_OUTPATIENT_CLINIC_OR_DEPARTMENT_OTHER): Payer: BC Managed Care – PPO | Admitting: Internal Medicine

## 2022-03-11 ENCOUNTER — Encounter (HOSPITAL_BASED_OUTPATIENT_CLINIC_OR_DEPARTMENT_OTHER): Payer: BC Managed Care – PPO | Attending: Internal Medicine | Admitting: Internal Medicine

## 2022-03-11 ENCOUNTER — Other Ambulatory Visit: Payer: Self-pay

## 2022-03-11 DIAGNOSIS — L89153 Pressure ulcer of sacral region, stage 3: Secondary | ICD-10-CM | POA: Diagnosis not present

## 2022-03-11 DIAGNOSIS — Z940 Kidney transplant status: Secondary | ICD-10-CM | POA: Insufficient documentation

## 2022-03-11 DIAGNOSIS — Z87891 Personal history of nicotine dependence: Secondary | ICD-10-CM | POA: Diagnosis not present

## 2022-03-11 DIAGNOSIS — E10622 Type 1 diabetes mellitus with other skin ulcer: Secondary | ICD-10-CM | POA: Insufficient documentation

## 2022-03-11 DIAGNOSIS — Z89511 Acquired absence of right leg below knee: Secondary | ICD-10-CM | POA: Diagnosis not present

## 2022-03-11 DIAGNOSIS — Z8619 Personal history of other infectious and parasitic diseases: Secondary | ICD-10-CM | POA: Insufficient documentation

## 2022-03-11 NOTE — Progress Notes (Addendum)
[name], [name] ([id number]) ?Visit Report for 03/11/2022 ?Arrival Information Details ?Patient Name: Date of Service: ?[name], [name] [facility] 03/11/2022 3:00 PM ?Medical Record Number: [id number] ?Patient Account Number: [id number] ?Date of Birth/Sex: Treating RN: ?Sep 24, 1961 (61 y.o. F) [name], [name] ?Primary Care Jakara Blatter: [name] Other Clinician: ?Referring Drena Ham: ?Treating Enes Rokosz/Extender: [name] ?[name] ?[name]: 86 ?Visit Information History Since Last Visit ?Added or deleted any medications: No ?Patient Arrived: Wheel Chair ?Any new allergies or adverse reactions: No ?Arrival Time: 15:20 ?Had a fall or experienced change in No ?Accompanied By: husband ?activities of daily living that may affect ?Transfer Assistance: Manual ?risk of falls: ?Patient Identification Verified: Yes ?Signs or symptoms of abuse/neglect since last visito No ?Secondary Verification Process Completed: Yes ?Hospitalized since last visit: No ?Patient Requires Transmission-Based Precautions: No ?Implantable device outside of the clinic excluding No ?Patient Has Alerts: No ?cellular tissue based products placed in the center ?since last visit: ?Has Dressing in Place as Prescribed: Yes ?Pain Present Now: No ?Electronic Signature(s) ?Signed: 03/11/2022 6:07:05 PM By: [name] RN, BSN ?Entered By: [name] on 03/11/2022 15:31:12 ?-------------------------------------------------------------------------------- ?Clinic Level of Care Assessment Details ?Patient Name: Date of Service: ?[name] [facility] 03/11/2022 3:00 PM ?Medical Record Number: [id number] ?Patient Account Number: [id number] ?Date of Birth/Sex: Treating RN: ?22-Aug-1961 (61 y.o. F) [name], [name] ?Primary Care Willmar Stockinger: [name] Other Clinician: ?Referring Tajuan Dufault: ?Treating Alexandrina Fiorini/Extender: [name] ?[name] ?[name]: 91 ?Clinic Level of Care Assessment Items ?TOOL 4 Quantity Score ?X- 1 0 ?Use when  only an EandM is performed on FOLLOW-UP visit ?ASSESSMENTS - Nursing Assessment / Reassessment ?X- 1 10 ?Reassessment of Co-morbidities (includes updates in patient status) ?X- 1 5 ?Reassessment of Adherence to Treatment Plan ?ASSESSMENTS - Wound and Skin A ssessment / Reassessment ?X - Simple Wound Assessment / Reassessment - one wound 1 5 ?[]  - 0 ?Complex Wound Assessment / Reassessment - multiple wounds ?X- 1 10 ?Dermatologic / Skin Assessment (not related to wound area) ?ASSESSMENTS - Focused Assessment ?[]  - 0 ?Circumferential Edema Measurements - multi extremities ?X- 1 10 ?Nutritional Assessment / Counseling / Intervention ?[]  - 0 ?Lower Extremity Assessment (monofilament, tuning fork, pulses) ?[]  - 0 ?Peripheral Arterial Disease Assessment (using hand held doppler) ?ASSESSMENTS - Ostomy and/or Continence Assessment and Care ?[]  - 0 ?Incontinence Assessment and Management ?[]  - 0 ?Ostomy Care Assessment and Management (repouching, etc.) ?PROCESS - Coordination of Care ?X - Simple Patient / Family Education for ongoing care 1 15 ?[]  - 0 ?Complex (extensive) Patient / Family Education for ongoing care ?X- 1 10 ?Staff obtains Consents, Records, T Results / Process Orders ?est ?[]  - 0 ?Staff telephones HHA, Nursing Homes / Clarify orders / etc ?[]  - 0 ?Routine Transfer to another Facility (non-emergent condition) ?[]  - 0 ?Routine Hospital Admission (non-emergent condition) ?[]  - 0 ?New Admissions / Biomedical engineer / Ordering NPWT Apligraf, etc. ?, ?[]  - 0 ?Emergency Hospital Admission (emergent condition) ?X- 1 10 ?Simple Discharge Coordination ?[]  - 0 ?Complex (extensive) Discharge Coordination ?PROCESS - Special Needs ?[]  - 0 ?Pediatric / Minor Patient Management ?[]  - 0 ?Isolation Patient Management ?[]  - 0 ?Hearing / Language / Visual special needs ?[]  - 0 ?Assessment of Community assistance (transportation, D/C planning, etc.) ?[]  - 0 ?Additional assistance / Altered mentation ?[]  - 0 ?Support  Surface(s) Assessment (bed, cushion, seat, etc.) ?INTERVENTIONS - Wound Cleansing / Measurement ?X - Simple Wound Cleansing - one wound 1 5 ?[]  - 0 ?Complex Wound Cleansing - multiple wounds ?X- 1  5 ?Wound Imaging (photographs - any number of wounds) ?[]  - 0 ?Wound Tracing (instead of photographs) ?X- 1 5 ?Simple Wound Measurement - one wound ?[]  - 0 ?Complex Wound Measurement - multiple wounds ?INTERVENTIONS - Wound Dressings ?X - Small Wound Dressing one or multiple wounds 1 10 ?[]  - 0 ?Medium Wound Dressing one or multiple wounds ?[]  - 0 ?Large Wound Dressing one or multiple wounds ?[]  - 0 ?Application of Medications - topical ?[]  - 0 ?Application of Medications - injection ?INTERVENTIONS - Miscellaneous ?[]  - 0 ?External ear exam ?[]  - 0 ?Specimen Collection (cultures, biopsies, blood, body fluids, etc.) ?[]  - 0 ?Specimen(s) / Culture(s) sent or taken to Lab for analysis ?[]  - 0 ?Patient Transfer (multiple staff / Civil Service fast streamer / Similar devices) ?[]  - 0 ?Simple Staple / Suture removal (25 or less) ?[]  - 0 ?Complex Staple / Suture removal (26 or more) ?[]  - 0 ?Hypo / Hyperglycemic Management (close monitor of Blood Glucose) ?[]  - 0 ?Ankle / Brachial Index (ABI) - do not check if billed separately ?X- 1 5 ?Vital Signs ?Has the patient been seen at the hospital within the last three years: Yes ?Total Score: 105 ?Level Of Care: New/Established - Level 3 ?Electronic Signature(s) ?Signed: 03/11/2022 6:07:05 PM By: [name] RN, BSN ?Entered By: [name] on 03/11/2022 15:57:13 ?-------------------------------------------------------------------------------- ?Encounter Discharge Information Details ?Patient Name: Date of Service: ?[name] [facility] 03/11/2022 3:00 PM ?Medical Record Number: [id number] ?Patient Account Number: [id number] ?Date of Birth/Sex: Treating RN: ?[birth year] (61 y.o. F) [name], [name] ?Primary Care Thetis Schwimmer: [name] Other Clinician: ?Referring Jinnie Onley: ?Treating Alesandra Smart/Extender:  [name] ?[name] ?[name]: 24 ?Encounter Discharge Information Items ?Discharge Condition: Stable ?Ambulatory Status: Wheelchair ?Discharge Destination: Home ?Transportation: Private Auto ?Accompanied By: husband ?Schedule Follow-up Appointment: Yes ?Clinical Summary of Care: ?Electronic Signature(s) ?Signed: 03/11/2022 6:07:05 PM By: [name] RN, BSN ?Entered By: [name] on 03/11/2022 15:58:45 ?-------------------------------------------------------------------------------- ?Lower Extremity Assessment Details ?Patient Name: Date of Service: ?KATRINIA, STRAKER [facility] 03/11/2022 3:00 PM ?Medical Record Number: [id number] ?Patient Account Number: [id number] ?Date of Birth/Sex: Treating RN: ?04/07/1961 (61 y.o. F) [name], [name] ?Primary Care Gautham Hewins: [name] Other Clinician: ?Referring Deandrew Hoecker: ?Treating Landon Truax/Extender: [name] ?[name] ?[name]: 2 ?Electronic Signature(s) ?Signed: 03/11/2022 6:07:05 PM By: [name] RN, BSN ?Entered By: [name] on 03/11/2022 15:31:42 ?-------------------------------------------------------------------------------- ?Multi Wound Chart Details ?Patient Name: Date of Service: ?SEQUOIA, WITZ [facility] 03/11/2022 3:00 PM ?Medical Record Number: [id number] ?Patient Account Number: [id number] ?Date of Birth/Sex: ?Treating RN: ?1961/11/27 (61 y.o. F) ?Primary Care Callen Zuba: [name] ?Other Clinician: ?Referring Sunil Hue: ?Treating Thornton Dohrmann/Extender: [name] ?[name] ?[name]: 25 ?Vital Signs ?Height(in): 53 ?Pulse(bpm): 96 ?Weight(lbs): 110 ?Blood Pressure(mmHg): 137/65 ?Body Mass Index(BMI): 27.5 ?Temperature(??F): 97.7 ?Respiratory Rate(breaths/min): 20 ?Photos: [1:No Photos Sacrum] [N/A:N/A N/A] ?Wound Location: [1:Pressure Injury] [N/A:N/A] ?Wounding Event: [1:Pressure Ulcer] [N/A:N/A] ?Primary Etiology: [1:Anemia, Type I Diabetes, End Stage N/A] ?Comorbid History: [1:Renal  Disease, Osteoarthritis, Osteomyelitis, Neuropathy, Received Radiation 02/24/2021] [N/A:N/A] ?Date Acquired: [1:37] [N/A:N/A] ?Weeks of Treatment: [1:Open] [N/A:N/A] ?Wound Status: [1:No] [N/A:N/A] ?Wound Recurrence: [1

## 2022-03-12 NOTE — Progress Notes (Signed)
[name], [name] ([id number]) ?Visit Report for 03/11/2022 ?Chief Complaint Document Details ?Patient Name: Date of Service: ?[name], [name] [facility] 03/11/2022 3:00 PM ?Medical Record Number: [id number] ?Patient Account Number: [id number] ?Date of Birth/Sex: Treating RN: ?06/13/1961 (61 y.o. F) ?Primary Care Provider: Melissa Lopez Other Clinician: ?Referring Provider: ?Treating Provider/Extender: [name] ?[name] ?[name]: 9 ?Information Obtained from: Patient ?Chief Complaint ?Sacral ulcer ?Electronic Signature(s) ?Signed: 03/12/2022 10:23:21 AM By: Doris Shan DO ?Entered By: [name] on 03/12/2022 10:18:15 ?-------------------------------------------------------------------------------- ?HPI Details ?Patient Name: Date of Service: ?[name], [name] [facility] 03/11/2022 3:00 PM ?Medical Record Number: [id number] ?Patient Account Number: [id number] ?Date of Birth/Sex: Treating RN: ?05-11-1961 (61 y.o. F) ?Primary Care Provider: Melissa Lopez Other Clinician: ?Referring Provider: ?Treating Provider/Extender: [name] ?[name] ?[name]: 57 ?History of Present Illness ?HPI Description: Admission 6/24 ?[name] is a 61 year old female with a past medical history of type 1 diabetes, right BKA, and kidney transplant that presents to the clinic for an ?27-month history of sacral ulcer. She has been using wet-to-dry dressings up until 5 weeks ago when she was started on a wound VAC. 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 She has completed 6 weeks of treatment and states she has been discharged by ?infectious disease. She currently denies any issues to the wound area. She reports minimal pain. She denies signs of infection. ?7/11; patient presents for 2-week follow-up. She uses a wound VAC that is changed with home health 3 times a week. She denies any signs of infection. She ?has no complaints or issues  today. ?8/1; patient presents for follow-up. She continues to use a wound VAC and this is changed with home health. She reports pain to the sacrum that started 1 ?week ago. She denies systemic signs of infection. ?8/15; patient presents for 2-week follow-up. She has been using wet-to-dry dressings daily. She reports improvement in the wound size and appearance. She ?denies infectious symptoms. ?9/12; patient presents for follow-up. She has been using wet-to-dry Dakin's moistened gauze. She reports following up with infectious disease and is currently ?taking amoxicillin. ?10/6; patient presents for follow-up. She has been using Dakin's wet-to-dry moistened gauze dressings. She is still taking amoxicillin. She canceled her ?appointment with infectious disease yesterday because she is tired of going to doctors appointments. She has not picked up gentamicin cream from the ?pharmacy to use on the wound bed. She currently denies signs of infection. ?11/3; patient presents for follow-up. She has been using Dakin's wet-to-dry dressings. She has no issues or complaints today. She denies signs of infection. ?12/1; patient presents for follow-up. She continues to use Dakin's wet-to-dry dressings. Patient followed up with Dr. Tommy Lopez, ID and amoxicillin was stopped ?as she had completed her course for sacral osteomyelitis. She reports itching to the periwound. She denies signs of infection. ?1/6; patient presents for follow-up. She has been using Dakin's wet-to-dry dressings. She never received the wound VAC. She reports improvement with ?ketoconazole to the periwound. She has no issues or complaints today. She denies signs of infection. ?3/17; patient presents for follow-up. She has been using Dakin's wet-to-dry dressings. She has no issues or complaints today. ?Electronic Signature(s) ?Signed: 03/12/2022 10:23:21 AM By: Doris Shan DO ?Entered By: [name] on 03/12/2022  10:18:40 ?-------------------------------------------------------------------------------- ?Physical Exam Details ?Patient Name: Date of Service: ?[name], [name] [facility] 03/11/2022 3:00 PM ?Medical Record Number: [id number] ?Patient Account Number: [id number] ?Date of Birth/Sex: Treating RN: ?July 08, 1961 (61 y.o. F) ?Primary Care Provider:  Melissa Lopez Other Clinician: ?Referring Provider: ?Treating Provider/Extender: [name] ?[name] ?[name]: 39 ?Constitutional ?respirations regular, non-labored and within target range for patient.Marland Kitchen ?Psychiatric ?pleasant and cooperative. ?Notes ?Sacral region: Large open wound with granulation tissue present. Undermining circumferentially. No Irritation to the periwound. No signs of soft tissue infection. ?Electronic Signature(s) ?Signed: 03/12/2022 10:23:21 AM By: Doris Shan DO ?Entered By: [name] on 03/12/2022 10:19:40 ?-------------------------------------------------------------------------------- ?Physician Orders Details ?Patient Name: Date of Service: ?[name], [name] [facility] 03/11/2022 3:00 PM ?Medical Record Number: [id number] ?Patient Account Number: [id number] ?Date of Birth/Sex: Treating RN: ?05/30/1961 (62 y.o. F) Lopez, Doris ?Primary Care Provider: Melissa Lopez Other Clinician: ?Referring Provider: ?Treating Provider/Extender: [name] ?[name] ?[name]: 58 ?Verbal / Phone Orders: No ?Diagnosis Coding ?ICD-10 Coding ?Code Description ?D63.875 Type 1 diabetes mellitus with other skin ulcer ?L89.153 Pressure ulcer of sacral region, stage 3 ?Z89.511 Acquired absence of right leg below knee ?Z94.0 Kidney transplant status ?Follow-up Appointments ?ppointment in 2 weeks. - [name] (Room 8 [place]) ?Return A ?Cellular or Tissue Based Products ?Cellular or Tissue Based Product Type: - Run IVR for Theraskin=Not Covered ?Bathing/ Shower/ Hygiene ?Do not shower or bathe in tub. ?Off-Loading ?Low air-loss  mattress (Group 2) - continue to use. ?Turn and reposition every 2 hours ?Additional Orders / Instructions ?Follow Nutritious Diet ?Wound Treatment ?Wound #1 - Sacrum ?Cleanser: Normal Saline (DME) (Generic) 1 x Per Day/30 Days ?Discharge Instructions: Use to moisten collagen. ?Cleanser: Wound Cleanser (Generic) 1 x Per Day/30 Days ?Discharge Instructions: Cleanse the wound with wound cleanser prior to applying a clean dressing using gauze sponges, not tissue or cotton balls. ?Peri-Wound Care: Skin Prep (Generic) 1 x Per Day/30 Days ?Discharge Instructions: Use skin prep as directed ?Peri-Wound Care: Zinc Oxide Ointment 30g tube 1 x Per Day/30 Days ?Discharge Instructions: Apply Zinc Oxide/Desitin to periwound as needed for any maceration/wetness noted around wound. ?Prim Dressing: Promogran Prisma Matrix, 4.34 (sq in) (silver collagen) (DME) (Generic) 1 x Per Day/30 Days ?ary ?Discharge Instructions: Moisten collagen with saline or hydrogel ?Prim Dressing: Dakin's Solution wet to dry 1 x Per Day/30 Days ?ary ?Discharge Instructions: Apply Dakin's moisten gauze to undermining and wound bed. ?Secondary Dressing: Woven Gauze Sponge, Non-Sterile 4x4 in (DME) (Generic) 1 x Per Day/30 Days ?Discharge Instructions: Moisten gauze with Dakin's ?Secondary Dressing: ABD Pad, 5x9 (Generic) 1 x Per Day/30 Days ?Discharge Instructions: Apply over primary dressing. ?Secured With: 19M Medipore H Soft Cloth Surgical T ape, 4 x 10 (in/yd) (DME) (Generic) 1 x Per Day/30 Days ?Discharge Instructions: Secure with tape as directed. ?Patient Medications ?llergies: No Known Allergies ?A ?Notifications Medication Indication Start End ?03/11/2022 ?Dakin's Solution ?DOSE 1 - miscellaneous 0.125 % solution - moisten gauze for wet to dry dressings ?Electronic Signature(s) ?Signed: 03/12/2022 10:23:21 AM By: Doris Shan DO ?Previous Signature: 03/11/2022 5:06:42 PM Version By: Doris Shan DO ?Entered By: [name] on 03/12/2022  10:20:06 ?-------------------------------------------------------------------------------- ?Problem List Details ?Patient Name: ?Date of Service: ?[name], [name] [facility] 03/11/2022 3:00 PM ?Medical Record Number: [id number] ?Patient Account Number: [id number] ?Dat

## 2022-03-15 ENCOUNTER — Ambulatory Visit: Payer: BC Managed Care – PPO | Admitting: Orthopedic Surgery

## 2022-03-19 ENCOUNTER — Other Ambulatory Visit: Payer: Self-pay

## 2022-03-19 ENCOUNTER — Encounter (HOSPITAL_COMMUNITY): Payer: Self-pay

## 2022-03-19 ENCOUNTER — Emergency Department (HOSPITAL_COMMUNITY)
Admission: EM | Admit: 2022-03-19 | Discharge: 2022-03-19 | Disposition: A | Discharge status: Home / Self Care | Payer: BC Managed Care – PPO | Attending: Emergency Medicine | Admitting: Emergency Medicine

## 2022-03-19 DIAGNOSIS — Z7982 Long term (current) use of aspirin: Secondary | ICD-10-CM | POA: Insufficient documentation

## 2022-03-19 DIAGNOSIS — Z794 Long term (current) use of insulin: Secondary | ICD-10-CM | POA: Diagnosis not present

## 2022-03-19 DIAGNOSIS — Z466 Encounter for fitting and adjustment of urinary device: Secondary | ICD-10-CM | POA: Insufficient documentation

## 2022-03-19 NOTE — ED Triage Notes (Signed)
Patient said her foley catheter fell out. It fell out 2 hours ago. Patient did not see the balloon inflated when it came out.  ?

## 2022-03-19 NOTE — ED Provider Notes (Signed)
?[name] ?Provider Note ? ? ?CSN: [id number] ?Arrival date & time: 03/19/22  2124 ? ?  ? ?History ? ?Chief Complaint  ?Patient presents with  ? Foley Catheter Problem  ? ? ?[name] is a 61 y.o. female. ? ?61 year old with chronic indwelling Foley catheter presents with complaint that her catheter fell out earlier today.  Catheter at bedside appears to be broken midway through the catheter tubing, patient's husband states that the respiratory come out as well.  Patient otherwise feeling well today without acute complaint. ? ? ?  ? ?Home Medications ?Prior to Admission medications   ?Medication Sig Start Date End Date Taking? Authorizing Provider  ?acetaminophen (TYLENOL) 325 MG tablet Take 2 tablets (650 mg total) by mouth every 6 (six) hours as needed for mild pain (or Fever >/= 101). ?Patient taking differently: Take 650 mg by mouth every 6 (six) hours as needed for mild pain or fever. 10/06/20   Nita Sells, MD  ?ascorbic acid (VITAMIN C) 250 MG tablet Take 1 tablet (250 mg total) by mouth 2 (two) times daily. 12/22/21   Eugenie Filler, MD  ?aspirin EC 81 MG tablet Take 81 mg by mouth daily. Swallow whole.    [provider]  ?atorvastatin (LIPITOR) 10 MG tablet Take 1 tablet (10 mg total) by mouth daily. 03/02/21   Georgette Shell, MD  ?azaTHIOprine (IMURAN) 50 MG tablet Take 1 tablet (50 mg total) by mouth daily. 03/02/21   Georgette Shell, MD  ?B Complex-C-Folic Acid TABS Take 1 tablet by mouth daily.    [provider]  ?cholestyramine (QUESTRAN) 4 g packet Take 1 packet (4 g total) by mouth 2 (two) times daily. 03/02/21   Georgette Shell, MD  ?collagenase Annitta Needs) ointment Apply topically daily. ?Patient taking differently: Apply 1 application topically daily. 03/02/21   Georgette Shell, MD  ?cycloSPORINE modified (NEORAL) 25 MG capsule Take 75 mg by mouth 2 (two) times daily. 12/13/21   [provider]   ?HYDROcodone bit-homatropine (HYCODAN) 5-1.5 MG/5ML syrup Take 5 mLs by mouth every 6 (six) hours as needed for cough. 12/22/21   Eugenie Filler, MD  ?insulin lispro (HUMALOG) 100 UNIT/ML injection Inject 0.02 mLs (2 Units total) into the skin 3 (three) times daily before meals. Sliding scale if 200-250=2 units;251-300=4units;301-350=6 units;351-400=8 units;104-450=10 units;451-500=12 units subcutaneously before meals and at bedtime for DM ?Patient taking differently: Inject 2-12 Units into the skin 3 (three) times daily before meals. Sliding scale if 200-250=2 units;251-300=4units;301-350=6 units;351-400=8 units;104-450=10 units;451-500=12 units subcutaneously before meals and at bedtime for DM Sliding scale ; via pump 03/02/21   Georgette Shell, MD  ?ketoconazole (NIZORAL) 2 % cream Apply 1 application topically daily. 11/26/21   [provider]  ?levothyroxine (SYNTHROID) 88 MCG tablet Take 1 tablet (88 mcg total) by mouth daily before breakfast. 03/02/21   Georgette Shell, MD  ?lisinopril (ZESTRIL) 5 MG tablet Take 5 mg by mouth daily. 12/19/19   [provider]  ?liver oil-zinc oxide (DESITIN) 40 % ointment Apply topically as needed for irritation. ?Patient taking differently: Apply 1 application topically daily as needed for irritation. 03/02/21   Georgette Shell, MD  ?loperamide (IMODIUM) 2 MG capsule Take 1 capsule (2 mg total) by mouth as needed for diarrhea or loose stools. ?Patient taking differently: Take 2 mg by mouth daily as needed for diarrhea or loose stools. 03/02/21   Georgette Shell, MD  ?ondansetron (ZOFRAN-ODT) 4 MG disintegrating tablet Take  1 tablet (4 mg total) by mouth every 4 (four) hours as needed for nausea or vomiting. 03/02/21   Georgette Shell, MD  ?oxyCODONE (OXY IR/ROXICODONE) 5 MG immediate release tablet Take 1 tablet (5 mg total) by mouth every 6 (six) hours as needed for severe pain. 03/02/21   Georgette Shell, MD  ?pantoprazole (PROTONIX)  40 MG tablet Take 1 tablet (40 mg total) by mouth 2 (two) times daily. Take 1 tablet twice daily x1 month, then 1 tablet daily thereafter. 12/22/21   Eugenie Filler, MD  ?predniSONE (DELTASONE) 5 MG tablet Take 1 tablet (5 mg total) by mouth daily with breakfast. 03/02/21   Georgette Shell, MD  ?saccharomyces boulardii (FLORASTOR) 250 MG capsule Take 1 capsule (250 mg total) by mouth 2 (two) times daily. ?Patient not taking: Reported on 12/17/2021 03/02/21   Georgette Shell, MD  ?sertraline (ZOLOFT) 50 MG tablet Take 50 mg by mouth daily.    [provider]  ?sodium hypochlorite (DAKIN'S 1/2 STRENGTH) external solution Apply 1 application topically daily. 05/14/21   [provider]  ?sucralfate (CARAFATE) 1 GM/10ML suspension Take 10 mLs (1 g total) by mouth 4 (four) times daily -  with meals and at bedtime. 12/22/21 01/21/22  Eugenie Filler, MD  ?triamcinolone (NASACORT) 55 MCG/ACT AERO nasal inhaler USE 1 SPRAY IN EACH NOSTRIL ?Patient not taking: Reported on 12/17/2021 07/13/21   Rozetta Nunnery, MD  ?   ? ?Allergies    ?Patient has no known allergies.   ? ?Review of Systems   ?Review of Systems ?Negative except as per HPI ?Physical Exam ?Updated Vital Signs ?BP (!) 149/97   Pulse (!) 106   Temp 98.1 ?F (36.7 ?C) (Oral)   Resp 17   Ht 5\' 3"  (1.6 m)   Wt 48 kg   SpO2 98%   BMI 18.75 kg/m?  ?Physical Exam ?Vitals and nursing note reviewed.  ?Constitutional:   ?   General: She is not in acute distress. ?   Appearance: She is not diaphoretic.  ?   Comments: Chronically ill appearing  ?HENT:  ?   Head: Normocephalic and atraumatic.  ?Pulmonary:  ?   Effort: Pulmonary effort is normal.  ?Abdominal:  ?   Palpations: Abdomen is soft.  ?   Tenderness: There is no abdominal tenderness.  ?Skin: ?   General: Skin is warm and dry.  ?Neurological:  ?   Mental Status: She is alert and oriented to person, place, and time.  ?Psychiatric:     ?   Behavior: Behavior normal.  ? ? ?ED Results  / Procedures / Treatments   ?Labs ?(all labs ordered are listed, but only abnormal results are displayed) ?Labs Reviewed - No data to display ? ?EKG ?None ? ?Radiology ?No results found. ? ?Procedures ?Procedures  ? ? ?Medications Ordered in ED ?Medications - No data to display ? ?ED Course/ Medical Decision Making/ A&P ?  ?                        ?Medical Decision Making ? ?61 year old female with chronic indwelling foley catheter (urethral), here for replacement after her broke and fell out this evening. Otherwise feeling at baseline. Abdomen is soft and non tender. Husband at bedside assists with history.  ?Plan is to replace foley and discharge.  ? ? ? ? ? ? ? ?Final Clinical Impression(s) / ED Diagnoses ?Final diagnoses:  ?Encounter for Foley  catheter replacement  ? ? ?Rx / DC Orders ?ED Discharge Orders   ? ? None  ? ?  ? ? ?  ?Tacy Learn, PA-C ?03/19/22 2331 ? ?  ?Drenda Freeze, MD ?03/19/22 2345 ? ?

## 2022-03-19 NOTE — ED Provider Triage Note (Signed)
Emergency Medicine Provider Triage Evaluation Note ? ?[name] , a 61 y.o. female  was evaluated in triage.  Pt complains of suprapubic catheter problem. States it fell out earlier today. She just had to had this changed out about 2-3 weeks ago after the same thing happened. She had a 14 in at that time. ? ?Review of Systems  ?Positive: Catheter problem ?Negative: fever ? ?Physical Exam  ?BP (!) 168/81 (BP Location: Left Arm)   Pulse (!) 115   Temp 98.1 ?F (36.7 ?C) (Oral)   Resp 20   Ht 5\' 3"  (1.6 m)   Wt 48 kg   SpO2 98%   BMI 18.75 kg/m?  ?Gen:   Awake, no distress   ?Resp:  Normal effort  ?MSK:   Moves extremities without difficulty  ?Other:   ? ?Medical Decision Making  ?Medically screening exam initiated at 9:38 PM.  Appropriate orders placed.  [name] was informed that the remainder of the evaluation will be completed by another provider, this initial triage assessment does not replace that evaluation, and the importance of remaining in the ED until their evaluation is complete. ? ? ?  ?Rodney Booze, PA-C ?03/19/22 2139 ? ?

## 2022-03-25 ENCOUNTER — Encounter (HOSPITAL_BASED_OUTPATIENT_CLINIC_OR_DEPARTMENT_OTHER): Payer: BC Managed Care – PPO | Admitting: Internal Medicine

## 2022-03-25 DIAGNOSIS — L89153 Pressure ulcer of sacral region, stage 3: Secondary | ICD-10-CM | POA: Diagnosis not present

## 2022-03-25 DIAGNOSIS — Z940 Kidney transplant status: Secondary | ICD-10-CM

## 2022-03-25 DIAGNOSIS — E10622 Type 1 diabetes mellitus with other skin ulcer: Secondary | ICD-10-CM

## 2022-03-25 DIAGNOSIS — Z89511 Acquired absence of right leg below knee: Secondary | ICD-10-CM

## 2022-03-25 NOTE — Progress Notes (Signed)
[name] ([id number]) ?Visit Report for 03/25/2022 ?Arrival Information Details ?Patient Name: Date of Service: ?[name], [name] 03/25/2022 3:00 PM ?Medical Record Number: [id number] ?Patient Account Number: [id number] ?Date of Birth/Sex: Treating RN: ?1961-12-10 (61 y.o. F) [name], [name] ?Primary Care [name]: [name] Other Clinician: ?Referring [name]: ?Treating [name]: [name] ?[name] ?[name]: 39 ?Visit Information History Since Last Visit ?Added or deleted any medications: No ?Patient Arrived: Wheel Chair ?Any new allergies or adverse reactions: No ?Arrival Time: 15:18 ?Had a fall or experienced change in No ?Accompanied By: husband ?activities of daily living that may affect ?Transfer Assistance: Manual ?risk of falls: ?Patient Identification Verified: Yes ?Signs or symptoms of abuse/neglect since last visito No ?Secondary Verification Process Completed: Yes ?Hospitalized since last visit: No ?Patient Requires Transmission-Based Precautions: No ?Implantable device outside of the clinic excluding No ?Patient Has Alerts: No ?cellular tissue based products placed in the center ?since last visit: ?Has Dressing in Place as Prescribed: Yes ?Pain Present Now: No ?Electronic Signature(s) ?Signed: 03/25/2022 4:54:01 PM By: [name] RN, BSN ?Entered By: [name] on 03/25/2022 15:18:57 ?-------------------------------------------------------------------------------- ?Clinic Level of Care Assessment Details ?Patient Name: Date of Service: ?[name], [name] 03/25/2022 3:00 PM ?Medical Record Number: [id number] ?Patient Account Number: [id number] ?Date of Birth/Sex: Treating RN: ?06/22/1961 (61 y.o. F) [name], [name] ?Primary Care [name]: [name] Other Clinician: ?Referring Aidian Salomon: ?Treating Kayline Sheer/Extender: [name] ?[name] ?[name]: 39 ?Clinic Level of Care Assessment Items ?TOOL 4 Quantity Score ?X- 1 0 ?Use when  only an EandM is performed on FOLLOW-UP visit ?ASSESSMENTS - Nursing Assessment / Reassessment ?X- 1 10 ?Reassessment of Co-morbidities (includes updates in patient status) ?X- 1 5 ?Reassessment of Adherence to Treatment Plan ?ASSESSMENTS - Wound and Skin A ssessment / Reassessment ?[]  - 0 ?Simple Wound Assessment / Reassessment - one wound ?X- 1 5 ?Complex Wound Assessment / Reassessment - multiple wounds ?X- 1 10 ?Dermatologic / Skin Assessment (not related to wound area) ?ASSESSMENTS - Focused Assessment ?[]  - 0 ?Circumferential Edema Measurements - multi extremities ?[]  - 0 ?Nutritional Assessment / Counseling / Intervention ?[]  - 0 ?Lower Extremity Assessment (monofilament, tuning fork, pulses) ?[]  - 0 ?Peripheral Arterial Disease Assessment (using hand held doppler) ?ASSESSMENTS - Ostomy and/or Continence Assessment and Care ?[]  - 0 ?Incontinence Assessment and Management ?[]  - 0 ?Ostomy Care Assessment and Management (repouching, etc.) ?PROCESS - Coordination of Care ?[]  - 0 ?Simple Patient / Family Education for ongoing care ?X- 1 20 ?Complex (extensive) Patient / Family Education for ongoing care ?X- 1 10 ?Staff obtains Consents, Records, T Results / Process Orders ?est ?[]  - 0 ?Staff telephones HHA, Nursing Homes / Clarify orders / etc ?[]  - 0 ?Routine Transfer to another Facility (non-emergent condition) ?[]  - 0 ?Routine Hospital Admission (non-emergent condition) ?[]  - 0 ?New Admissions / Biomedical engineer / Ordering NPWT Apligraf, etc. ?, ?[]  - 0 ?Emergency Hospital Admission (emergent condition) ?[]  - 0 ?Simple Discharge Coordination ?X- 1 15 ?Complex (extensive) Discharge Coordination ?PROCESS - Special Needs ?[]  - 0 ?Pediatric / Minor Patient Management ?[]  - 0 ?Isolation Patient Management ?[]  - 0 ?Hearing / Language / Visual special needs ?[]  - 0 ?Assessment of Community assistance (transportation, D/C planning, etc.) ?[]  - 0 ?Additional assistance / Altered mentation ?[]  - 0 ?Support  Surface(s) Assessment (bed, cushion, seat, etc.) ?INTERVENTIONS - Wound Cleansing / Measurement ?X - Simple Wound Cleansing - one wound 1 5 ?[]  - 0 ?Complex Wound Cleansing - multiple wounds ?X- 1 5 ?Wound  Imaging (photographs - any number of wounds) ?[]  - 0 ?Wound Tracing (instead of photographs) ?X- 1 5 ?Simple Wound Measurement - one wound ?[]  - 0 ?Complex Wound Measurement - multiple wounds ?INTERVENTIONS - Wound Dressings ?[]  - 0 ?Small Wound Dressing one or multiple wounds ?X- 1 15 ?Medium Wound Dressing one or multiple wounds ?[]  - 0 ?Large Wound Dressing one or multiple wounds ?[]  - 0 ?Application of Medications - topical ?[]  - 0 ?Application of Medications - injection ?INTERVENTIONS - Miscellaneous ?[]  - 0 ?External ear exam ?[]  - 0 ?Specimen Collection (cultures, biopsies, blood, body fluids, etc.) ?[]  - 0 ?Specimen(s) / Culture(s) sent or taken to Lab for analysis ?[]  - 0 ?Patient Transfer (multiple staff / Civil Service fast streamer / Similar devices) ?[]  - 0 ?Simple Staple / Suture removal (25 or less) ?[]  - 0 ?Complex Staple / Suture removal (26 or more) ?[]  - 0 ?Hypo / Hyperglycemic Management (close monitor of Blood Glucose) ?[]  - 0 ?Ankle / Brachial Index (ABI) - do not check if billed separately ?X- 1 5 ?Vital Signs ?Has the patient been seen at the hospital within the last three years: Yes ?Total Score: 110 ?Level Of Care: New/Established - Level 3 ?Electronic Signature(s) ?Signed: 03/25/2022 4:54:01 PM By: [name] RN, BSN ?Entered By: [name] on 03/25/2022 15:41:30 ?-------------------------------------------------------------------------------- ?Encounter Discharge Information Details ?Patient Name: Date of Service: ?[name], [name] 03/25/2022 3:00 PM ?Medical Record Number: [id number] ?Patient Account Number: [id number] ?Date of Birth/Sex: Treating RN: ?February 04, 1961 (61 y.o. F) [name], [name] ?Primary Care Caidyn Blossom: [name] Other Clinician: ?Referring Marenda Accardi: ?Treating Collan Schoenfeld/Extender:  [name] ?[name] ?[name]: 39 ?Encounter Discharge Information Items ?Discharge Condition: Stable ?Ambulatory Status: Wheelchair ?Discharge Destination: Home ?Transportation: Private Auto ?Accompanied By: husband ?Schedule Follow-up Appointment: Yes ?Clinical Summary of Care: ?Electronic Signature(s) ?Signed: 03/25/2022 4:54:01 PM By: [name] RN, BSN ?Entered By: [name] on 03/25/2022 15:42:16 ?-------------------------------------------------------------------------------- ?Lower Extremity Assessment Details ?Patient Name: Date of Service: ?[name], [name] 03/25/2022 3:00 PM ?Medical Record Number: [id number] ?Patient Account Number: [id number] ?Date of Birth/Sex: Treating RN: ?November 18, 1961 (61 y.o. F) [name], [name] ?Primary Care Rosalea Withrow: [name] Other Clinician: ?Referring Leni Pankonin: ?Treating Lexandra Rettke/Extender: [name] ?[name] ?[name]: 39 ?Electronic Signature(s) ?Signed: 03/25/2022 4:54:01 PM By: [name] RN, BSN ?Entered By: [name] on 03/25/2022 15:28:43 ?-------------------------------------------------------------------------------- ?Multi Wound Chart Details ?Patient Name: Date of Service: ?[name], [name] 03/25/2022 3:00 PM ?Medical Record Number: [id number] ?Patient Account Number: [id number] ?Date of Birth/Sex: ?Treating RN: ?06/30/1961 (61 y.o. F) [name], [name] ?Primary Care Kiona Blume: [name] ?Other Clinician: ?Referring Lelar Farewell: ?Treating Brynlie Daza/Extender: [name] ?[name] ?[name]: 39 ?Vital Signs ?Height(in): 53 ?Pulse(bpm): 97 ?Weight(lbs): 110 ?Blood Pressure(mmHg): 130/80 ?Body Mass Index(BMI): 27.5 ?Temperature(??F): 98.0 ?Respiratory Rate(breaths/min): 18 ?Photos: [N/A:N/A] ?Sacrum N/A N/A ?Wound Location: ?Pressure Injury N/A N/A ?Wounding Event: ?Pressure Ulcer N/A N/A ?Primary Etiology: ?Anemia, Type I Diabetes, End Stage N/A N/A ?Comorbid History: ?Renal Disease,  Osteoarthritis, ?Osteomyelitis, Neuropathy, Received ?Radiation ?02/24/2021 N/A N/A ?Date Acquired: ?89 N/A N/A ?Weeks of Treatment: ?Open N/A N/A ?Wound Status: ?No N/A N/A ?Wound Recurrence: ?4x2x0.8 N/A N/A ?Measur

## 2022-03-25 NOTE — Progress Notes (Signed)
[name], [name] ([id number]) ?Visit Report for 03/25/2022 ?Chief Complaint Document Details ?Patient Name: Date of Service: ?[name] 03/25/2022 3:00 PM ?Medical Record Number: [id number] ?Patient Account Number: [id number] ?Date of Birth/Sex: Treating RN: ?04-17-1961 (61 y.o. F) [name], [name] ?Primary Care Provider: [name] Other Clinician: ?Referring Provider: ?Treating Provider/Extender: [name] ?[name] ?[name]: 39 ?Information Obtained from: Patient ?Chief Complaint ?Sacral ulcer ?Electronic Signature(s) ?Signed: 03/25/2022 4:15:00 PM By: [name] DO ?Entered By: [name] on 03/25/2022 15:52:42 ?-------------------------------------------------------------------------------- ?HPI Details ?Patient Name: Date of Service: ?[name], [name] 03/25/2022 3:00 PM ?Medical Record Number: [id number] ?Patient Account Number: [id number] ?Date of Birth/Sex: Treating RN: ?01-19-1961 (61 y.o. F) [name], [name] ?Primary Care Provider: [name] Other Clinician: ?Referring Provider: ?Treating Provider/Extender: [name] ?[name] ?[name]: 39 ?History of Present Illness ?HPI Description: Admission 6/24 ?[name] is a 61 year old female with a past medical history of type 1 diabetes, right BKA, and kidney transplant that presents to the clinic for an ?44-month history of sacral ulcer. She has been using wet-to-dry dressings up until 5 weeks ago when she was started on a wound VAC. 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 She has completed 6 weeks of treatment and states she has been discharged by ?infectious disease. She currently denies any issues to the wound area. She reports minimal pain. She denies signs of infection. ?7/11; patient presents for 2-week follow-up. She uses a wound VAC that is changed with home health 3 times a week. She denies any signs of infection. She ?has  no complaints or issues today. ?8/1; patient presents for follow-up. She continues to use a wound VAC and this is changed with home health. She reports pain to the sacrum that started 1 ?week ago. She denies systemic signs of infection. ?8/15; patient presents for 2-week follow-up. She has been using wet-to-dry dressings daily. She reports improvement in the wound size and appearance. She ?denies infectious symptoms. ?9/12; patient presents for follow-up. She has been using wet-to-dry Dakin's moistened gauze. She reports following up with infectious disease and is currently ?taking amoxicillin. ?10/6; patient presents for follow-up. She has been using Dakin's wet-to-dry moistened gauze dressings. She is still taking amoxicillin. She canceled her ?appointment with infectious disease yesterday because she is tired of going to doctors appointments. She has not picked up gentamicin cream from the ?pharmacy to use on the wound bed. She currently denies signs of infection. ?11/3; patient presents for follow-up. She has been using Dakin's wet-to-dry dressings. She has no issues or complaints today. She denies signs of infection. ?12/1; patient presents for follow-up. She continues to use Dakin's wet-to-dry dressings. Patient followed up with Dr. Tommy Medal, ID and amoxicillin was stopped ?as she had completed her course for sacral osteomyelitis. She reports itching to the periwound. She denies signs of infection. ?1/6; patient presents for follow-up. She has been using Dakin's wet-to-dry dressings. She never received the wound VAC. She reports improvement with ?ketoconazole to the periwound. She has no issues or complaints today. She denies signs of infection. ?3/17; patient presents for follow-up. She has been using Dakin's wet-to-dry dressings. She has no issues or complaints today. ?3/30; patient presents for follow-up. She has been using collagen with Dakin's wet-to-dry packing. She has no issues or complaints today. She  does state that ?she would like to try and do a muscle flap to help close the wound. ?Electronic Signature(s) ?Signed: 03/25/2022 4:15:00 PM By: Heber Rupert,  Janett Billow DO ?Entered By: [name] on 03/25/2022 15:53:23 ?-------------------------------------------------------------------------------- ?Physical Exam Details ?Patient Name: Date of Service: ?[name], [name] 03/25/2022 3:00 PM ?Medical Record Number: [id number] ?Patient Account Number: [id number] ?Date of Birth/Sex: Treating RN: ?1961/09/26 (61 y.o. F) [name], [name] ?Primary Care Provider: [name] Other Clinician: ?Referring Provider: ?Treating Provider/Extender: [name] ?[name] ?[name]: 39 ?Constitutional ?respirations regular, non-labored and within target range for patient.Marland Kitchen ?Psychiatric ?pleasant and cooperative. ?Notes ?Sacral region: Large open wound with granulation tissue present. Undermining circumferentially. No Irritation to the periwound. No signs of soft tissue infection. ?Electronic Signature(s) ?Signed: 03/25/2022 4:15:00 PM By: [name] DO ?Entered By: [name] on 03/25/2022 16:04:56 ?-------------------------------------------------------------------------------- ?Physician Orders Details ?Patient Name: Date of Service: ?[name], [name] 03/25/2022 3:00 PM ?Medical Record Number: [id number] ?Patient Account Number: [id number] ?Date of Birth/Sex: Treating RN: ?18-Oct-1961 (61 y.o. F) [name], [name] ?Primary Care Provider: [name] Other Clinician: ?Referring Provider: ?Treating Provider/Extender: [name] ?[name] ?[name]: 39 ?Verbal / Phone Orders: No ?Diagnosis Coding ?ICD-10 Coding ?Code Description ?W54.627 Type 1 diabetes mellitus with other skin ulcer ?L89.153 Pressure ulcer of sacral region, stage 3 ?Z89.511 Acquired absence of right leg below knee ?Z94.0 Kidney transplant status ?Follow-up Appointments ?ppointment in: - x6 weeks -Dr. Heber Kenyon (Room  8 [facility]) if you get in with [facility] wound center before this appt time call here and ?Return A ?cancel your appt time with [place]. ?Licensed conveyancer ?Do not shower or bathe in tub. ?Off-Loading ?Low air-loss mattress (Group 2) - continue to use. ?Turn and reposition every 2 hours ?Additional Orders / Instructions ?Follow Nutritious Diet ?Wound Treatment ?Wound #1 - Sacrum ?Cleanser: Normal Saline (Generic) 1 x Per Day/30 Days ?Discharge Instructions: Use to moisten collagen. ?Cleanser: Wound Cleanser (Generic) 1 x Per Day/30 Days ?Discharge Instructions: Cleanse the wound with wound cleanser prior to applying a clean dressing using gauze sponges, not tissue or cotton balls. ?Peri-Wound Care: Skin Prep (Generic) 1 x Per Day/30 Days ?Discharge Instructions: Use skin prep as directed ?Peri-Wound Care: Zinc Oxide Ointment 30g tube 1 x Per Day/30 Days ?Discharge Instructions: Apply Zinc Oxide/Desitin to periwound as needed for any maceration/wetness noted around wound. ?Prim Dressing: Promogran Prisma Matrix, 4.34 (sq in) (silver collagen) (Generic) 1 x Per Day/30 Days ?ary ?Discharge Instructions: Moisten collagen with saline or hydrogel ?Prim Dressing: Dakin's Solution wet to dry 1 x Per Day/30 Days ?ary ?Discharge Instructions: Apply Dakin's moisten gauze to undermining and wound bed. ?Secondary Dressing: Woven Gauze Sponge, Non-Sterile 4x4 in (Generic) 1 x Per Day/30 Days ?Discharge Instructions: Moisten gauze with Dakin's ?Secondary Dressing: ABD Pad, 5x9 (Generic) 1 x Per Day/30 Days ?Discharge Instructions: Apply over primary dressing. ?Secured With: 47M Medipore H Soft Cloth Surgical T ape, 4 x 10 (in/yd) (Generic) 1 x Per Day/30 Days ?Discharge Instructions: Secure with tape as directed. ?Consults ?[employer]- referral for evaluation of plastics and wound care possibility of muscle ?flap. - (ICD10 L89.153 - Pressure ulcer of sacral region, stage  3) ?Electronic Signature(s) ?Signed: 03/25/2022 4:15:00 PM By: [name] DO ?Entered By: [name] on 03/25/2022 16:05:11 ?Prescription 03/25/2022 ?------------------------------------------

## 2022-03-29 NOTE — Therapy (Incomplete)
? ?OUTPATIENT PHYSICAL THERAPY PROSTHETICS EVALUATION ? ? ?Patient Name: [name] ?MRN: [id number] ?DOB:[birth year], 61 y.o., female ?Today's Date: 03/29/2022 ? ?PCP: [name], MD ?REFERRING PROVIDER: Newt Minion, MD ? ? ? ?Past Medical History:  ?Diagnosis Date  ? Anemia of chronic renal failure   ? Cellulitis and abscess of right leg 09/26/2020  ? Chronic osteomyelitis of sacrum ([place]) 08/25/2021  ? DDD (degenerative disc disease), lumbar   ? Diabetes mellitus without complication ([place])   ? type 1  ? Diabetic ketoacidosis ([place]) 09/26/2020  ? 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 Gangrene of right foot ([place])   ? Hypertension   ? Hypothyroid 12/08/2020  ? Neuropathy   ? Renal disease   ? Sacral decubitus ulcer, stage IV ([place]) 12/08/2020  ? Subacute osteomyelitis of right foot ([place])   ? ?Past Surgical History:  ?Procedure Laterality Date  ? AMPUTATION Right 10/01/2020  ? Procedure: RIGHT BELOW KNEE AMPUTATION;  Surgeon: Newt Minion, MD;  Location: [facility];  Service: Orthopedics;  Laterality: Right;  ? BLADDER SURGERY    ? ESOPHAGOGASTRODUODENOSCOPY (EGD) WITH PROPOFOL N/A 11/10/2020  ? Procedure: ESOPHAGOGASTRODUODENOSCOPY (EGD) WITH PROPOFOL;  Surgeon: Irene Shipper, MD;  Location: [facility];  Service: Endoscopy;  Laterality: N/A;  ? IR GASTROSTOMY TUBE REMOVAL  03/25/2021  ? KIDNEY TRANSPLANT    ? LEG SURGERY    ? tib-fib fx , ORIF  ? ?Patient Active Problem List  ? Diagnosis Date Noted  ? Acute cystitis without hematuria   ? Acute pyelonephritis   ? AKI (acute kidney injury) ([place]) 12/18/2021  ? Chronic indwelling Foley catheter 12/17/2021  ? S/P BKA (below knee amputation) unilateral, right ([place]) 12/17/2021  ? DNR (do not resuscitate)/DNI(Do Not Intubate) 12/17/2021  ? Chronic osteomyelitis of sacrum ([place]) 08/25/2021  ? Anemia 02/25/2021  ? Goals of care, counseling/discussion   ? Palliative care by specialist   ? Malnutrition of moderate degree 01/07/2021  ? Failure to thrive in adult   ? Unspecified  severe protein-calorie malnutrition ([place]) 12/10/2020  ? Sacral decubitus ulcer, stage IV ([place]) 12/08/2020  ? Erosive esophagitis 12/08/2020  ? Hypothyroid 12/08/2020  ? Depression 12/08/2020  ? Type 1 diabetes mellitus with hyperlipidemia ([place]) 12/08/2020  ? Hypoalbuminemia 12/07/2020  ? Coffee ground emesis   ? Gastroesophageal reflux disease with esophagitis and hemorrhage   ? Long-term use of immunosuppressant medication   ? Type 1 diabetes mellitus with complication, with long term current use of insulin pump ([place]) 09/27/2020  ? Pressure ulcer of BKA stump ([place]) 09/27/2020  ? Acute kidney injury superimposed on CKD ([place]) 09/26/2020  ? Anemia secondary to renal failure 05/14/2020  ? History of renal transplant 05/14/2020  ? Chronic kidney disease (CKD) stage G3a/A1, moderately decreased glomerular filtration rate (GFR) between 45-59 mL/min/1.73 square meter and albuminuria creatinine ratio less than 30 mg/g ([place]) 05/14/2020  ? Hypertension 05/14/2020  ? ? ?ONSET DATE: 02/10/2022 MD referral to PT ? ?REFERRING DIAG: Z89.511 (ICD-10-CM) - Right below-knee amputee ? ?THERAPY DIAG:  ?No diagnosis found. ? ?SUBJECTIVE:  ? ?SUBJECTIVE STATEMENT: ?*** ?Pt accompanied by: {accompnied:27141} ? ?PERTINENT HISTORY: DM1, right BKA, kidney transplant, skin CA, HTN, HLD, neuropathy, right hip replacement 2015, sacral ulcer ? ?PAIN:  ?Are you having pain? {OPRCPAIN:27236} ? ?PRECAUTIONS: {Therapy precautions:24002} ? ?WEIGHT BEARING RESTRICTIONS {Yes ***/No:24003} ? ?FALLS: Has patient fallen in last 6 months? {fallsyesno:27318} ? ?LIVING ENVIRONMENT: ?Lives with: {OPRC lives with:25569::"lives with their family"} ?Lives in: {Lives in:25570} ?Home Access: {HOMEACCESS:26869} ?Home  layout: {Home Layout:26870} ?Stairs: {opstairs:27293} ?Has following equipment at home: {Assistive devices:23999} ? ?PLOF: {PLOF:24004} ? ?PATIENT GOALS *** ? ?OBJECTIVE:  ? ?DIAGNOSTIC FINDINGS: *** ? ?COGNITION: ?Overall cognitive status:  {cognition:24006} ?  ?SENSATION: ?{sensation:27233} ? ?MUSCLE LENGTH: ?Hamstrings: Right *** deg; Left *** deg ?Thomas test: Right *** deg; Left *** deg ? ? ?POSTURE: {posture:25561} ? ?LE ROM: ? ?{AROM/PROM:27142} ROM Right ?03/29/2022 Left ?03/29/2022  ?Hip flexion    ?Hip extension    ?Hip abduction    ?Hip adduction    ?Hip internal rotation    ?Hip external rotation    ?Knee flexion    ?Knee extension    ?Ankle dorsiflexion    ?Ankle plantarflexion    ?Ankle inversion    ?Ankle eversion    ?(Blank rows = not tested) ?MMT: ? ?MMT Right ?03/29/2022 Left ?03/29/2022  ?Hip flexion    ?Hip extension    ?Hip abduction    ?Hip adduction    ?Hip internal rotation    ?Hip external rotation    ?Knee flexion    ?Knee extension    ?Ankle dorsiflexion    ?Ankle plantarflexion    ?Ankle inversion    ?Ankle eversion    ?(Blank rows = not tested) ? ? ?TRANSFERS: ?{transfers:26872} ? ?GAIT: ?Gait pattern: {gait characteristics:25376} ?Distance walked: *** ?Assistive device utilized: {Assistive devices:23999} ?Level of assistance: {Levels of assistance:24026} ?Gait velocity: *** {uom:26873} ?Comments: *** ? ?RAMP {Levels of assistance:24026} ? ?CURB: {Levels of assistance:24026} ? ?FUNCTIONAL TESTs:  ?{Functional tests:24029} ? ?CARDIOVASCULAR RESPONSE: ?Functional activity: *** ?Pre-activity vitals: ?BP: *** ?HR: *** ?SpO2: *** ?Post-activity vitals: ?BP: *** ?HR: *** ?SpO2: *** ?Modified Borg scale for dyspnea: {Modified Borg scale:26876} ? ?CURRENT PROSTHETIC WEAR ASSESSMENT: ?Patient is independent with: {prosthetic care:26874} ?Patient is dependent with: {prosthetic care:26874} ?Donning prosthesis: {Levels of assistance:24026} ?Doffing prosthesis: {Levels of assistance:24026} ?Prosthetic wear tolerance: *** hours/day, *** days/week ?Prosthetic weight bearing tolerance: *** minutes ?Edema: *** ?Residual limb condition: *** ?Prosthetic description: *** ?K code/activity level with prosthetic use: {K-levels:26875} ? ?PATIENT SURVEYS:   ?{rehab surveys:24030} ? ? ? ?TODAY'S TREATMENT:  ? ?TREATMENT:  ?PATIENT EDUCATED ON FOLLOWING PROSTHETIC CARE: ?Prosthetic wear tolerance: *** hours/day, *** days/week ?Prosthetic weight bearing tolerance: *** minutes ?Other education  {PTprostheticeducation:27015} ? ? ?PATIENT EDUCATION: ?Education details: *** ?Person educated: {Person educated:25204} ?Education method: {Education Method:25205} ?Education comprehension: {Education Comprehension:25206} ? ?HOME EXERCISE PROGRAM: ?*** ? ?ASSESSMENT: ? ?CLINICAL IMPRESSION: ?Patient is a 62 y.o. female who was seen today for physical therapy evaluation and treatment for below knee amputation. *** ? ?OBJECTIVE IMPAIRMENTS {opptimpairments:25111}.  ? ?ACTIVITY LIMITATIONS {activity limitations:25113}.  ? ?PERSONAL FACTORS {Personal factors:25162} are also affecting patient's functional outcome.  ? ?REHAB POTENTIAL: {rehabpotential:25112} ? ?CLINICAL DECISION MAKING: {clinical decision making:25114} ? ?EVALUATION COMPLEXITY: {Evaluation complexity:25115} ? ? ?GOALS: ?Goals reviewed with patient? {yes/no:20286} ? ?SHORT TERM GOALS: Target date: *** ? ?Patient donnes prosthesis modified independent & verbalizes proper cleaning. ?Baseline: SEE OBJECTIVE DATA ?Goal status: INITIAL ?2.  Patient tolerates prosthesis >10 hrs total /day without skin issues or limb pain >5/10 after standing. ?Baseline: SEE OBJECTIVE DATA ?Goal status: INITIAL ? ?3.  Patient able to reach 7" and look over both shoulders without UE support with supervision. ?Baseline: SEE OBJECTIVE DATA ?Goal status: INITIAL ? ?4. Patient ambulates 45' with RW & prosthesis with supervision. ?Baseline: SEE OBJECTIVE DATA ?Goal status: INITIAL ? ?5. Patient negotiates ramps & curbs with RW & prosthesis with minA. ?Baseline: SEE OBJECTIVE DATA ?Goal status: INITIAL ? ?LONG TERM GOALS: Target date: *** ? ?  Patient demonstrates & verbalized understanding of prosthetic care to enable safe utilization of  prosthesis. ?Baseline: SEE OBJECTIVE DATA ?Goal status: INITIAL ? ?Patient tolerates prosthesis wear >90% of awake hours without skin or limb pain issues. ?Baseline: SEE OBJECTIVE DATA ?Goal status: INITIAL ? ?Berg Balance >/= 36/

## 2022-03-30 ENCOUNTER — Encounter: Payer: BC Managed Care – PPO | Admitting: Physical Therapy

## 2022-04-13 ENCOUNTER — Encounter (HOSPITAL_BASED_OUTPATIENT_CLINIC_OR_DEPARTMENT_OTHER): Payer: BC Managed Care – PPO | Attending: Internal Medicine | Admitting: Internal Medicine

## 2022-04-13 DIAGNOSIS — Z992 Dependence on renal dialysis: Secondary | ICD-10-CM | POA: Insufficient documentation

## 2022-04-13 DIAGNOSIS — Z940 Kidney transplant status: Secondary | ICD-10-CM | POA: Diagnosis not present

## 2022-04-13 DIAGNOSIS — E1022 Type 1 diabetes mellitus with diabetic chronic kidney disease: Secondary | ICD-10-CM | POA: Insufficient documentation

## 2022-04-13 DIAGNOSIS — L89153 Pressure ulcer of sacral region, stage 3: Secondary | ICD-10-CM | POA: Diagnosis not present

## 2022-04-13 DIAGNOSIS — Z950 Presence of cardiac pacemaker: Secondary | ICD-10-CM | POA: Diagnosis not present

## 2022-04-13 DIAGNOSIS — N186 End stage renal disease: Secondary | ICD-10-CM | POA: Diagnosis not present

## 2022-04-13 DIAGNOSIS — E10622 Type 1 diabetes mellitus with other skin ulcer: Secondary | ICD-10-CM | POA: Diagnosis not present

## 2022-04-13 DIAGNOSIS — Z89511 Acquired absence of right leg below knee: Secondary | ICD-10-CM | POA: Diagnosis not present

## 2022-04-13 DIAGNOSIS — M8668 Other chronic osteomyelitis, other site: Secondary | ICD-10-CM | POA: Insufficient documentation

## 2022-04-13 NOTE — Progress Notes (Signed)
[name], [name] ([id number]) ?Visit Report for 04/13/2022 ?Arrival Information Details ?Patient Name: Date of Service: ?[name], [name] 04/13/2022 2:15 PM ?Medical Record Number: [id number] ?Patient Account Number: [id number] ?Date of Birth/Sex: Treating RN: ?[birth year] (61 y.o. [name] ?Primary Care [name]: [name] Other Clinician: ?Referring [name]: ?Treating [name]/Extender: [name] ?[name] ?[name]: 42 ?Visit Information History Since Last Visit ?Added or deleted any medications: No ?Patient Arrived: Wheel Chair ?Any new allergies or adverse reactions: No ?Arrival Time: 14:23 ?Had a fall or experienced change in No ?Accompanied By: husband ?activities of daily living that may affect ?Transfer Assistance: Manual ?risk of falls: ?Patient Identification Verified: Yes ?Signs or symptoms of abuse/neglect since last visito No ?Secondary Verification Process Completed: Yes ?Hospitalized since last visit: No ?Patient Requires Transmission-Based Precautions: No ?Implantable device outside of the clinic excluding No ?Patient Has Alerts: No ?cellular tissue based products placed in the center ?since last visit: ?Has Dressing in Place as Prescribed: Yes ?Pain Present Now: No ?Electronic Signature(s) ?Signed: 04/13/2022 5:04:09 PM By: [name] ?Entered By: [name] on 04/13/2022 14:28:11 ?-------------------------------------------------------------------------------- ?Clinic Level of Care Assessment Details ?Patient Name: Date of Service: ?[name], [name] 04/13/2022 2:15 PM ?Medical Record Number: [id number] ?Patient Account Number: [id number] ?Date of Birth/Sex: Treating RN: ?Dec 11, 1961 (61 y.o. F) [name] ?Primary Care [name]: [name] Other Clinician: ?Referring [name]: ?Treating [name]/Extender: [name] ?[name] ?[name]: 42 ?Clinic Level of Care Assessment Items ?TOOL 4 Quantity Score ?X- 1 0 ?Use when only  an EandM is performed on FOLLOW-UP visit ?ASSESSMENTS - Nursing Assessment / Reassessment ?X- 1 10 ?Reassessment of Co-morbidities (includes updates in patient status) ?X- 1 5 ?Reassessment of Adherence to Treatment Plan ?ASSESSMENTS - Wound and Skin A ssessment / Reassessment ?X - Simple Wound Assessment / Reassessment - one wound 1 5 ?[]  - 0 ?Complex Wound Assessment / Reassessment - multiple wounds ?X- 1 10 ?Dermatologic / Skin Assessment (not related to wound area) ?ASSESSMENTS - Focused Assessment ?[]  - 0 ?Circumferential Edema Measurements - multi extremities ?[]  - 0 ?Nutritional Assessment / Counseling / Intervention ?[]  - 0 ?Lower Extremity Assessment (monofilament, tuning fork, pulses) ?[]  - 0 ?Peripheral Arterial Disease Assessment (using hand held doppler) ?ASSESSMENTS - Ostomy and/or Continence Assessment and Care ?[]  - 0 ?Incontinence Assessment and Management ?[]  - 0 ?Ostomy Care Assessment and Management (repouching, etc.) ?PROCESS - Coordination of Care ?X - Simple Patient / Family Education for ongoing care 1 15 ?[]  - 0 ?Complex (extensive) Patient / Family Education for ongoing care ?X- 1 10 ?Staff obtains Consents, Records, T Results / Process Orders ?est ?X- 1 10 ?Staff telephones HHA, Nursing Homes / Clarify orders / etc ?[]  - 0 ?Routine Transfer to another Facility (non-emergent condition) ?[]  - 0 ?Routine Hospital Admission (non-emergent condition) ?[]  - 0 ?New Admissions / Biomedical engineer / Ordering NPWT Apligraf, etc. ?, ?[]  - 0 ?Emergency Hospital Admission (emergent condition) ?X- 1 10 ?Simple Discharge Coordination ?[]  - 0 ?Complex (extensive) Discharge Coordination ?PROCESS - Special Needs ?[]  - 0 ?Pediatric / Minor Patient Management ?[]  - 0 ?Isolation Patient Management ?[]  - 0 ?Hearing / Language / Visual special needs ?[]  - 0 ?Assessment of Community assistance (transportation, D/C planning, etc.) ?[]  - 0 ?Additional assistance / Altered mentation ?[]  - 0 ?Support Surface(s)  Assessment (bed, cushion, seat, etc.) ?INTERVENTIONS - Wound Cleansing / Measurement ?X - Simple Wound Cleansing - one wound 1 5 ?[]  - 0 ?Complex Wound Cleansing - multiple wounds ?X- 1 5 ?Wound  Imaging (photographs - any number of wounds) ?[]  - 0 ?Wound Tracing (instead of photographs) ?X- 1 5 ?Simple Wound Measurement - one wound ?[]  - 0 ?Complex Wound Measurement - multiple wounds ?INTERVENTIONS - Wound Dressings ?X - Small Wound Dressing one or multiple wounds 1 10 ?[]  - 0 ?Medium Wound Dressing one or multiple wounds ?[]  - 0 ?Large Wound Dressing one or multiple wounds ?[]  - 0 ?Application of Medications - topical ?[]  - 0 ?Application of Medications - injection ?INTERVENTIONS - Miscellaneous ?[]  - 0 ?External ear exam ?[]  - 0 ?Specimen Collection (cultures, biopsies, blood, body fluids, etc.) ?[]  - 0 ?Specimen(s) / Culture(s) sent or taken to Lab for analysis ?[]  - 0 ?Patient Transfer (multiple staff / Civil Service fast streamer / Similar devices) ?[]  - 0 ?Simple Staple / Suture removal (25 or less) ?[]  - 0 ?Complex Staple / Suture removal (26 or more) ?[]  - 0 ?Hypo / Hyperglycemic Management (close monitor of Blood Glucose) ?[]  - 0 ?Ankle / Brachial Index (ABI) - do not check if billed separately ?X- 1 5 ?Vital Signs ?Has the patient been seen at the hospital within the last three years: Yes ?Total Score: 105 ?Level Of Care: New/Established - Level 3 ?Electronic Signature(s) ?Signed: 04/13/2022 5:03:45 PM By: Doris Pilling RN, BSN ?Entered By: [name] on 04/13/2022 15:27:35 ?-------------------------------------------------------------------------------- ?Lower Extremity Assessment Details ?Patient Name: Date of Service: ?[name], [name] 04/13/2022 2:15 PM ?Medical Record Number: [id number] ?Patient Account Number: [id number] ?Date of Birth/Sex: Treating RN: ?01/24/1961 (61 y.o. [name] ?Primary Care Thirza Pellicano: [name] Other Clinician: ?Referring [name]: ?Treating [name]/Extender: [name] ?[name] ?[name]: 42 ?Electronic Signature(s) ?Signed: 04/13/2022 5:04:09 PM By: [name] ?Entered By: [name] on 04/13/2022 14:29:01 ?-------------------------------------------------------------------------------- ?Multi Wound Chart Details ?Patient Name: Date of Service: ?[name], [name] 04/13/2022 2:15 PM ?Medical Record Number: [id number] ?Patient Account Number: [id number] ?Date of Birth/Sex: Treating RN: ?03-Jun-1961 (61 y.o. F) [name] ?Primary Care Vernard Gram: [name] Other Clinician: ?Referring Jola Critzer: ?Treating Dontrae Morini/Extender: [name] ?[name] ?[name]: 42 ?Vital Signs ?Height(in): 53 ?Pulse(bpm): 83 ?Weight(lbs): 110 ?Blood Pressure(mmHg): 136/75 ?Body Mass Index(BMI): 27.5 ?Temperature(??F): 98.1 ?Respiratory Rate(breaths/min): 16 ?Photos: [N/A:N/A] ?Sacrum N/A N/A ?Wound Location: ?Pressure Injury N/A N/A ?Wounding Event: ?Pressure Ulcer N/A N/A ?Primary Etiology: ?Anemia, Type I Diabetes, End Stage N/A N/A ?Comorbid History: ?Renal Disease, Osteoarthritis, ?Osteomyelitis, Neuropathy, Received ?Radiation ?02/24/2021 N/A N/A ?Date Acquired: ?17 N/A N/A ?Weeks of Treatment: ?Open N/A N/A ?Wound Status: ?No N/A N/A ?Wound Recurrence: ?4x2x0.7 N/A N/A ?Measurements L x W x D (cm) ?6.283 N/A N/A ?A (cm?) : ?rea ?4.398 N/A N/A ?Volume (cm?) : ?74.50% N/A N/A ?% Reduction in A rea: ?88.10% N/A N/A ?% Reduction in Volume: ?1 ?Starting Position 1 (o'clock): ?5 ?Ending Position 1 (o'clock): ?1.3 ?Maximum Distance 1 (cm): ?Yes N/A N/A ?Undermining: ?Category/Stage III N/A N/A ?Classification: ?Medium N/A N/A ?Exudate A mount: ?Purulent N/A N/A ?Exudate Type: ?yellow, brown, green N/A N/A ?Exudate Color: ?Epibole N/A N/A ?Wound Margin: ?Large (67-100%) N/A N/A ?Granulation A mount: ?Red, Pink N/A N/A ?Granulation Quality: ?Small (1-33%) N/A N/A ?Necrotic A mount: ?Fat Layer (Subcutaneous Tissue): Yes N/A N/A ?Exposed  Structures: ?Fascia: No ?Tendon: No ?Muscle: No ?Joint: No ?Bone: No ?Medium (34-66%) N/A N/A ?Epithelialization: ?Treatment Notes ?Wound #1 (Sacrum) ?Cleanser ?Normal Saline ?Discharge Instruction: Use to moisten collagen.

## 2022-04-13 NOTE — Progress Notes (Signed)
KIMYATTA, [name] ([id number]) ?Visit Report for 04/13/2022 ?Chief Complaint Document Details ?Patient Name: Date of Service: ?[name], [name] 04/13/2022 2:15 PM ?Medical Record Number: [id number] ?Patient Account Number: [id number] ?Date of Birth/Sex: Treating RN: ?May 18, 1961 (61 y.o. F) [name] ?Primary Care Provider: Melissa Lopez Other Clinician: ?Referring Provider: ?Treating Provider/Extender: [name] ?[name] ?[name]: 42 ?Information Obtained from: Patient ?Chief Complaint ?Sacral ulcer ?Electronic Signature(s) ?Signed: 04/13/2022 4:53:25 PM By: Doris Shan DO ?Entered By: [name] on 04/13/2022 16:37:52 ?-------------------------------------------------------------------------------- ?HPI Details ?Patient Name: Date of Service: ?[name], [name] 04/13/2022 2:15 PM ?Medical Record Number: [id number] ?Patient Account Number: [id number] ?Date of Birth/Sex: Treating RN: ?Apr 29, 1961 (61 y.o. F) [name] ?Primary Care Provider: Melissa Lopez Other Clinician: ?Referring Provider: ?Treating Provider/Extender: [name] ?[name] ?[name]: 42 ?History of Present Illness ?HPI Description: Admission 6/24 ?[name] is a 61 year old female with a past medical history of type 1 diabetes, right BKA, and kidney transplant that presents to the clinic for an ?34-month history of sacral ulcer. She has been using wet-to-dry dressings up until 5 weeks ago when she was started on a wound VAC. 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 She has completed 6 weeks of treatment and states she has been discharged by ?infectious disease. She currently denies any issues to the wound area. She reports minimal pain. She denies signs of infection. ?7/11; patient presents for 2-week follow-up. She uses a wound VAC that is changed with home health 3 times a week. She denies any signs of infection. She ?has  no complaints or issues today. ?8/1; patient presents for follow-up. She continues to use a wound VAC and this is changed with home health. She reports pain to the sacrum that started 1 ?week ago. She denies systemic signs of infection. ?8/15; patient presents for 2-week follow-up. She has been using wet-to-dry dressings daily. She reports improvement in the wound size and appearance. She ?denies infectious symptoms. ?9/12; patient presents for follow-up. She has been using wet-to-dry Dakin's moistened gauze. She reports following up with infectious disease and is currently ?taking amoxicillin. ?10/6; patient presents for follow-up. She has been using Dakin's wet-to-dry moistened gauze dressings. She is still taking amoxicillin. She canceled her ?appointment with infectious disease yesterday because she is tired of going to doctors appointments. She has not picked up gentamicin cream from the ?pharmacy to use on the wound bed. She currently denies signs of infection. ?11/3; patient presents for follow-up. She has been using Dakin's wet-to-dry dressings. She has no issues or complaints today. She denies signs of infection. ?12/1; patient presents for follow-up. She continues to use Dakin's wet-to-dry dressings. Patient followed up with Dr. Tommy Medal, ID and amoxicillin was stopped ?as she had completed her course for sacral osteomyelitis. She reports itching to the periwound. She denies signs of infection. ?1/6; patient presents for follow-up. She has been using Dakin's wet-to-dry dressings. She never received the wound VAC. She reports improvement with ?ketoconazole to the periwound. She has no issues or complaints today. She denies signs of infection. ?3/17; patient presents for follow-up. She has been using Dakin's wet-to-dry dressings. She has no issues or complaints today. ?3/30; patient presents for follow-up. She has been using collagen with Dakin's wet-to-dry packing. She has no issues or complaints today. She  does state that ?she would like to try and do a muscle flap to help close the wound. ?4/18; patient presents for follow-up. She changed her  mind on a muscle flap. She would like to try a wound VAC. We went ahead and ordered this and she has ?been using it for the past week. She has no issues or complaints today. She denies signs of infection. ?Electronic Signature(s) ?Signed: 04/13/2022 4:53:25 PM By: Doris Shan DO ?Entered By: [name] on 04/13/2022 16:39:08 ?-------------------------------------------------------------------------------- ?Physical Exam Details ?Patient Name: Date of Service: ?[name], [name] 04/13/2022 2:15 PM ?Medical Record Number: [id number] ?Patient Account Number: [id number] ?Date of Birth/Sex: Treating RN: ?22-Dec-1961 (61 y.o. F) [name] ?Primary Care Provider: Melissa Lopez Other Clinician: ?Referring Provider: ?Treating Provider/Extender: [name] ?[name] ?[name]: 42 ?Constitutional ?respirations regular, non-labored and within target range for patient.Marland Kitchen ?Psychiatric ?pleasant and cooperative. ?Notes ?Sacral region: Large open wound with granulation tissue present. Undermining circumferentially. No Irritation to the periwound. No signs of soft tissue infection. ?Electronic Signature(s) ?Signed: 04/13/2022 4:53:25 PM By: Doris Shan DO ?Entered By: [name] on 04/13/2022 16:39:46 ?-------------------------------------------------------------------------------- ?Physician Orders Details ?Patient Name: Date of Service: ?[name], [name] 04/13/2022 2:15 PM ?Medical Record Number: [id number] ?Patient Account Number: [id number] ?Date of Birth/Sex: Treating RN: ?03/01/1961 (61 y.o. F) [name] ?Primary Care Provider: Melissa Lopez Other Clinician: ?Referring Provider: ?Treating Provider/Extender: [name] ?[name] ?[name]: 42 ?Verbal / Phone Orders: No ?Diagnosis Coding ?ICD-10 Coding ?Code  Description ?H41.937 Type 1 diabetes mellitus with other skin ulcer ?L89.153 Pressure ulcer of sacral region, stage 3 ?Z89.511 Acquired absence of right leg below knee ?Z94.0 Kidney transplant status ?Follow-up Appointments ?Return appointment in 1 month. - Dr. Heber [place] [facility] [place], Room 8 05/11/2022 3pm Tuesday ?Bathing/ Shower/ Hygiene ?Do not shower or bathe in tub. ?Negative Presssure Wound Therapy ?Wound #1 Sacrum ?Wound Vac to wound continuously at 180mm/hg pressure ?Black Foam ?Off-Loading ?Low air-loss mattress (Group 2) - continue to use. ?Turn and reposition every 2 hours ?Additional Orders / Instructions ?Follow Nutritious Diet ?Home Health ?No change in wound care orders this week; continue Home Health for wound care. May utilize formulary equivalent dressing for wound ?treatment orders unless otherwise specified. ?Dressing changes to be completed by Home Health on Monday / Wednesday / Friday except when patient has scheduled visit at [facility] ?[facility]. - home health to change three times a week wound vac. Please ensure the black foam goes under the small area noted of undermining ?as well. wound center to apply dakins wet to dry in clinic. ?Other Home Health Orders/Instructions: - [facility] ?Wound Treatment ?Wound #1 - Sacrum ?Cleanser: Normal Saline (Generic) 3 x Per Week/30 Days ?Discharge Instructions: Use to moisten collagen. ?Cleanser: Wound Cleanser (Generic) 3 x Per Week/30 Days ?Discharge Instructions: Cleanse the wound with wound cleanser prior to applying a clean dressing using gauze sponges, not tissue or cotton balls. ?Peri-Wound Care: Skin Prep (Generic) 3 x Per Week/30 Days ?Discharge Instructions: Use skin prep as directed ?Prim Dressing: Dakin's Solution wet to dry 3 x Per Week/30 Days ?ary ?Discharge Instructions: Apply Dakin's moisten gauze to undermining and wound bed IN CLINIC ONLY ?Secondary Dressing: ABD Pad, 5x9 (Generic) 3 x Per Week/30 Days ?Discharge Instructions:  Apply over primary dressing. IN CLINIC ONLY ?Secondary Dressing: Woven Gauze Sponge, Non-Sterile 4x4 in (Generic) 3 x Per Week/30 Days ?Discharge Instructions: Moisten gauze with Dakin's IN CLINIC ONLY ?Secured [employer]

## 2022-04-16 IMAGING — DX DG CHEST 1V PORT
1 series · 1 of 1 positions shown · non-contrast
Comparison: Chest radiograph dated 12/25/2020.

CLINICAL DATA: 59-year-old female with sepsis.

EXAM:
PORTABLE CHEST 1 VIEW

[chest ap]
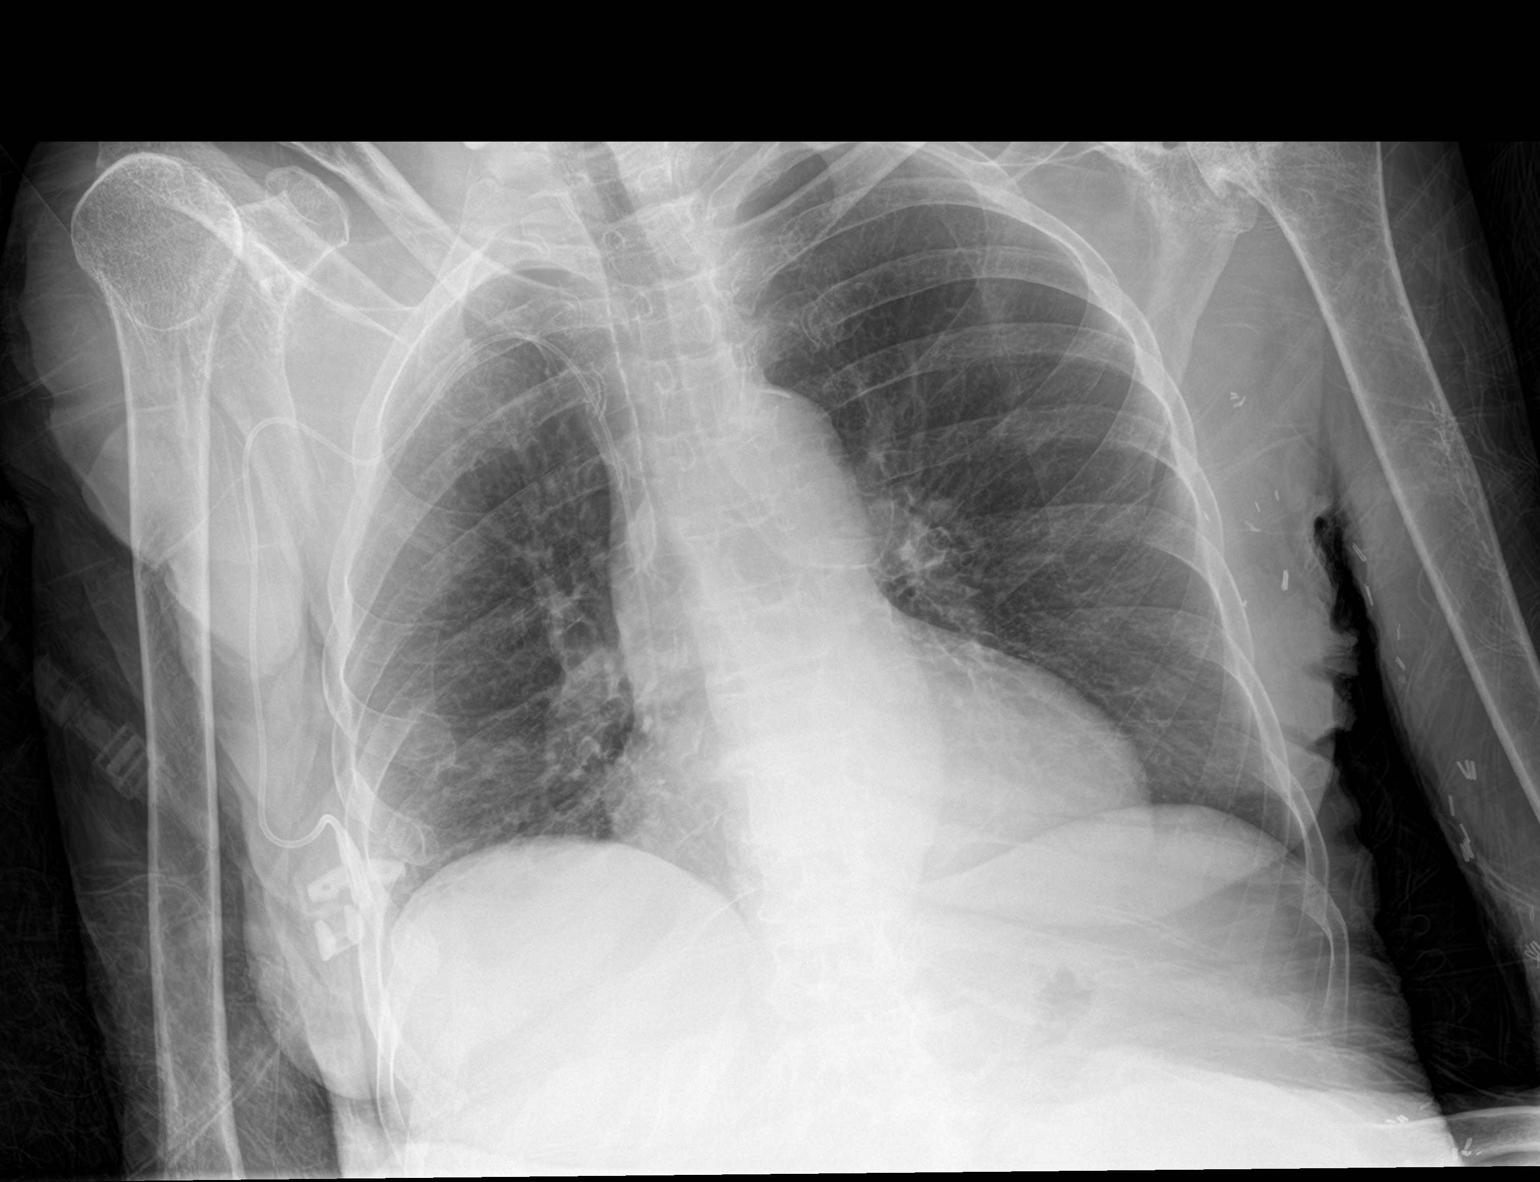

[1 of 1 positions shown; findings below may reference images not displayed]

FINDINGS: Right-sided PICC with tip at the cavoatrial junction. No focal
consolidation, pleural effusion or pneumothorax. Borderline
cardiomegaly. No acute osseous pathology. Osteopenia.
IMPRESSION: No acute cardiopulmonary process.

## 2022-04-16 IMAGING — CT CT HEAD W/O CM
3 series · 16 of 47 positions shown, 19 images · non-contrast
Comparison: Head CT dated 09/26/2020.

CLINICAL DATA: 59-year-old female with altered mental status.

EXAM:
CT HEAD WITHOUT CONTRAST
TECHNIQUE: Contiguous axial images were obtained from the base of the skull
through the vertex without intravenous contrast.

[Series 2: head w o · axial · 0.44mm/px · z∈[+4,+134]mm · 10 of 32 slices shown, 13 images]
[im 3/32  brain]
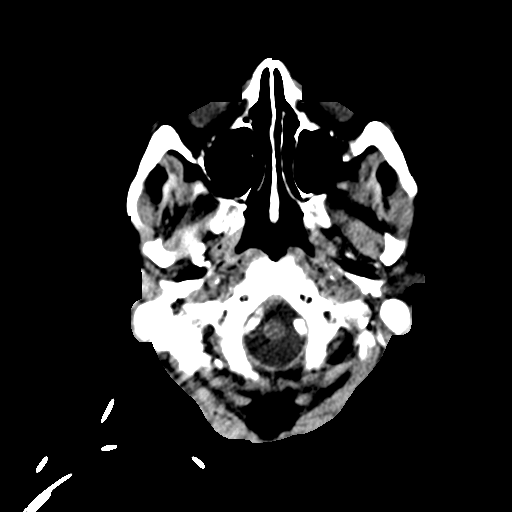
[im 3/32  bone]
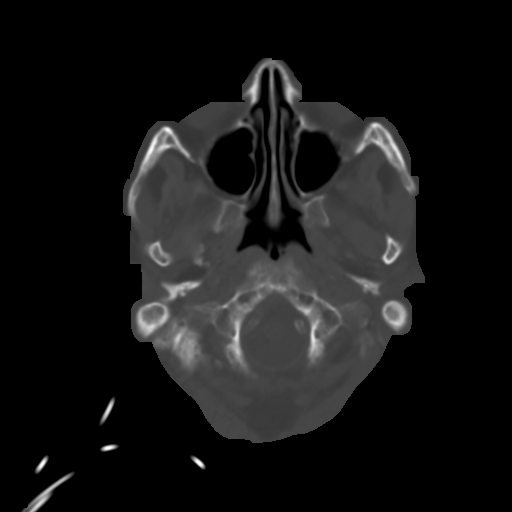
[im 6/32  brain]
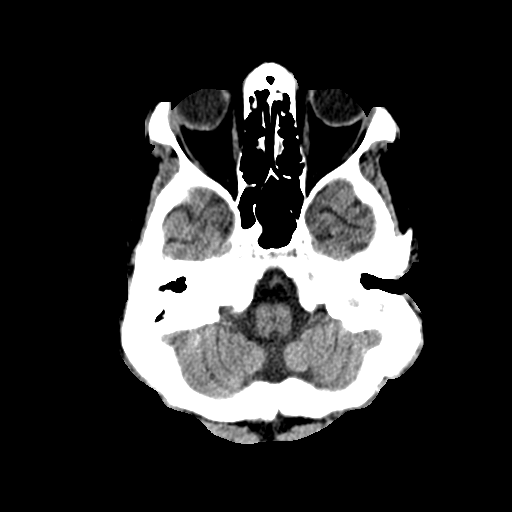
[im 9/32  brain]
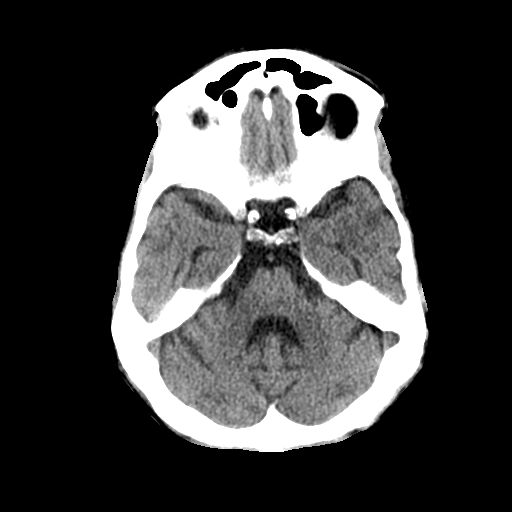
[im 11/32  brain]
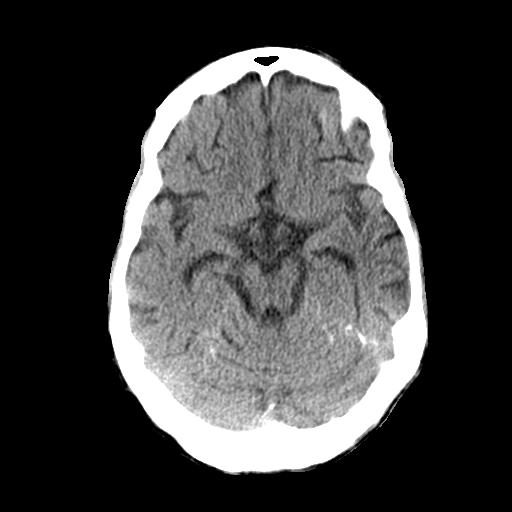
[im 14/32  brain]
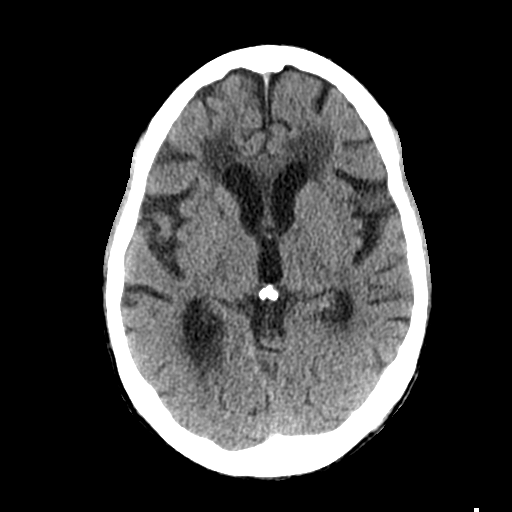
[im 14/32  bone]
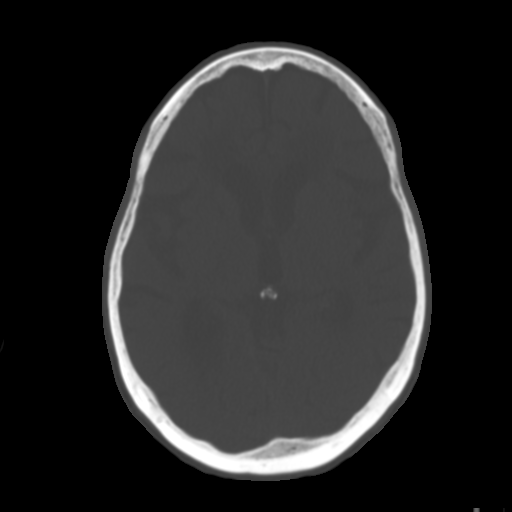
[im 18/32  brain]
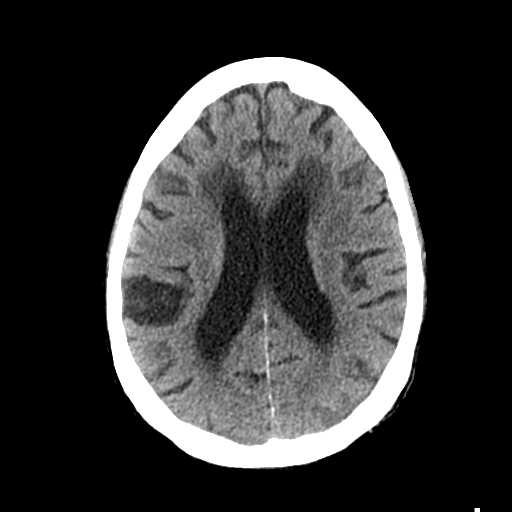
[im 21/32  brain]
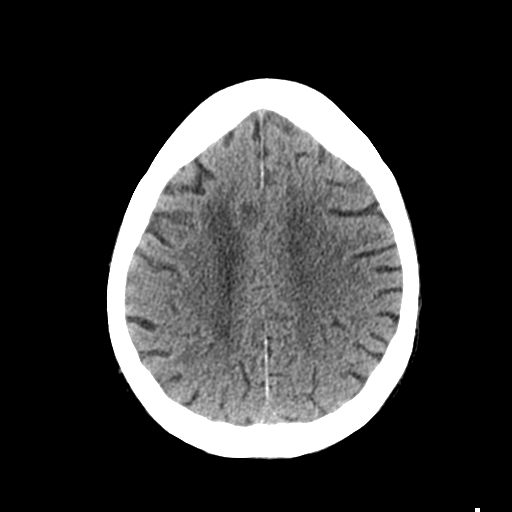
[im 24/32  brain]
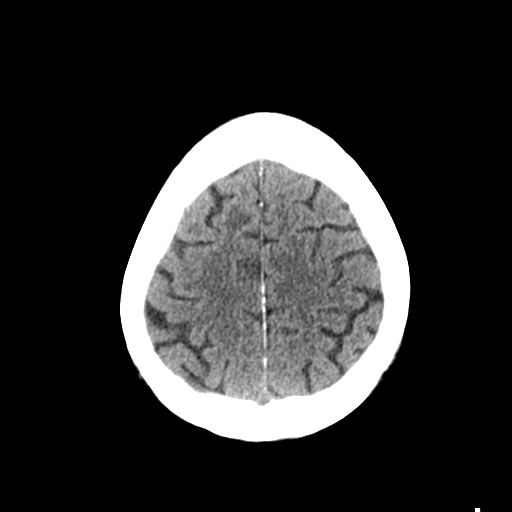
[im 26/32  brain]
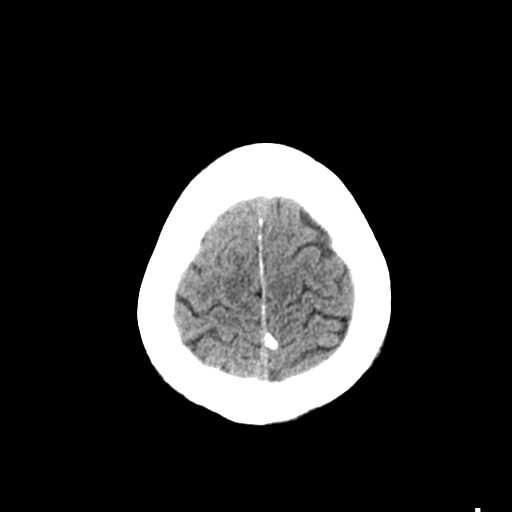
[im 26/32  bone]
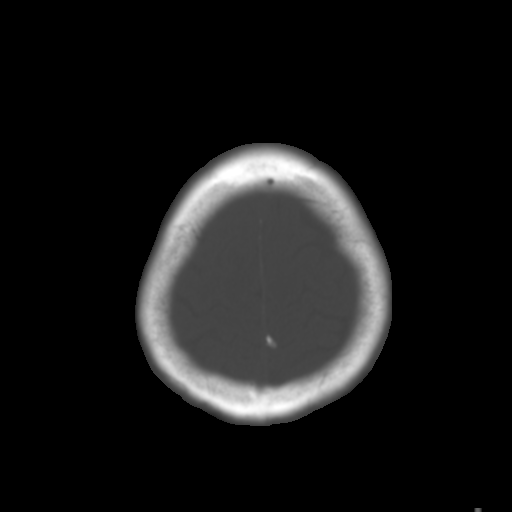
[im 29/32  brain]
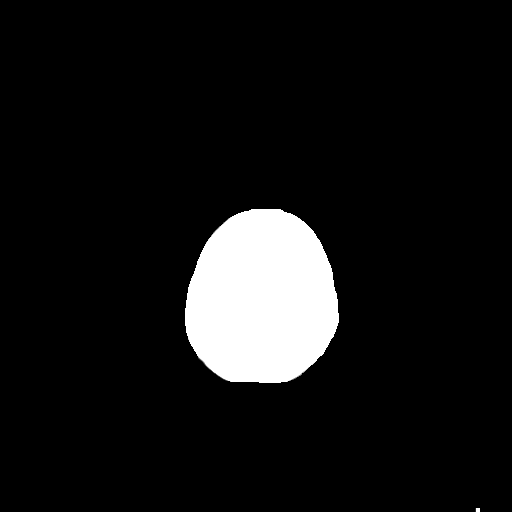

[Series 4: coronal soft · coronal · 0.33mm/px · 3 of 70 slices shown]
[im 24/70  brain]
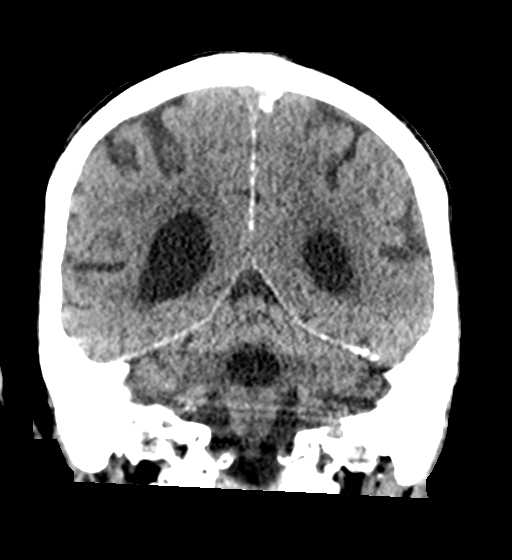
[im 31/70  brain]
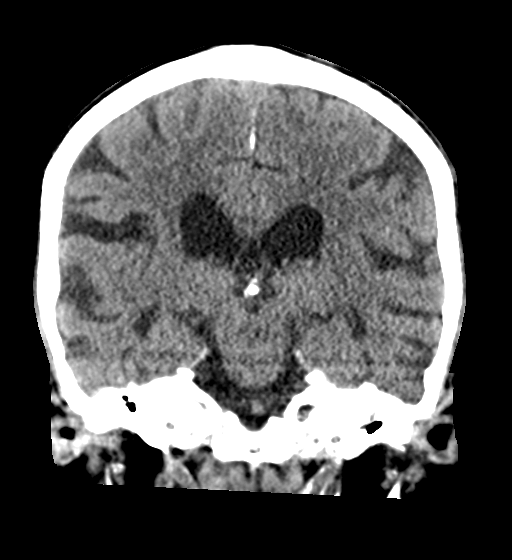
[im 39/70  brain]
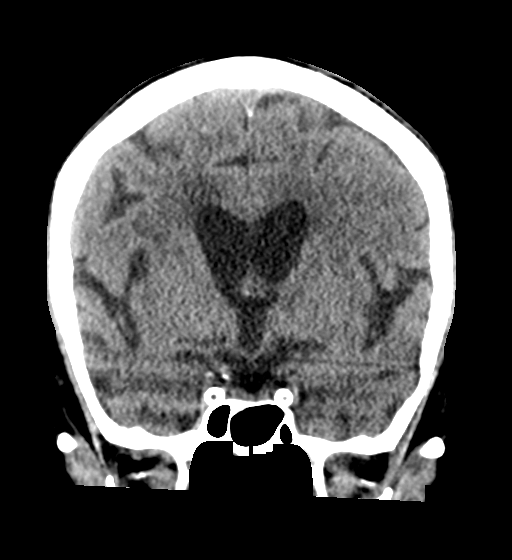

[Series 5: sagittal soft · sagittal · 0.36mm/px · 3 of 55 slices shown]
[im 19/55  brain]
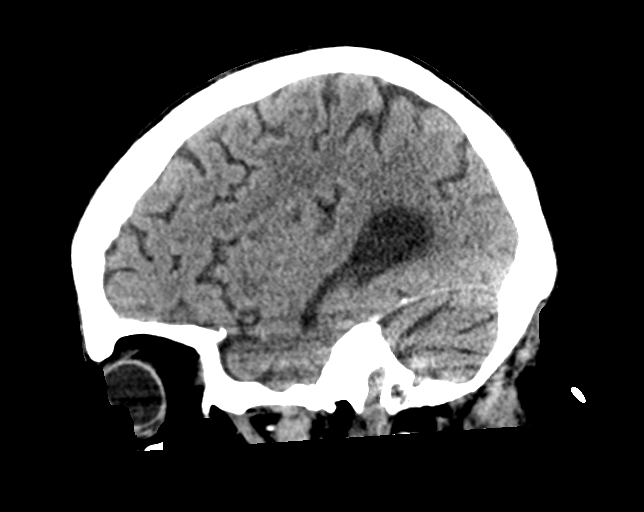
[im 28/55  brain]
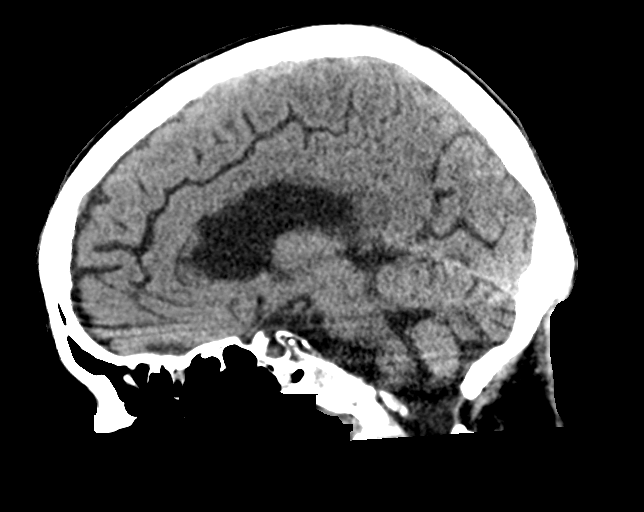
[im 37/55  brain]
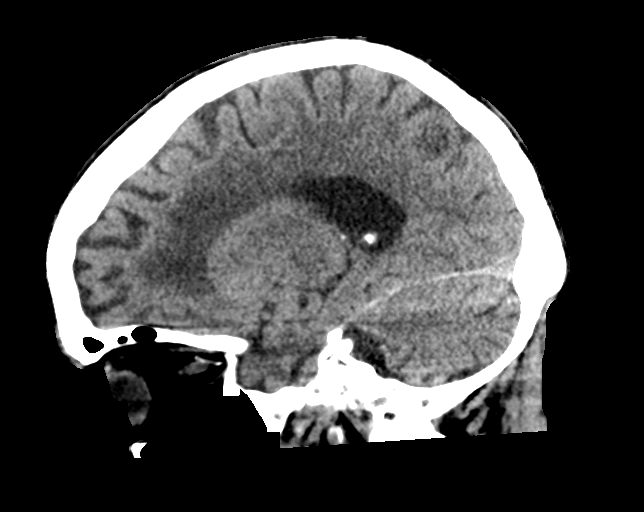

[16 of 47 positions shown; findings below may reference images not displayed]

FINDINGS: Brain: Mild age-related atrophy and moderate chronic microvascular
ischemic changes. There is no acute intracranial hemorrhage. No mass
effect midline shift. No extra-axial fluid collection.

Vascular: No hyperdense vessel or unexpected calcification.

Skull: Normal. Negative for fracture or focal lesion.

Sinuses/Orbits: The visualized paranasal sinuses are clear. There is
complete opacification of the mastoid air cells.

Other: None
IMPRESSION: 1. No acute intracranial pathology.
2. Age-related atrophy and chronic microvascular ischemic changes.
3. Bilateral mastoid effusions.

## 2022-04-20 ENCOUNTER — Telehealth: Payer: Self-pay

## 2022-04-20 NOTE — Telephone Encounter (Signed)
PC check in call, no answer ?

## 2022-05-05 ENCOUNTER — Telehealth: Payer: Self-pay

## 2022-05-05 NOTE — Telephone Encounter (Signed)
(  1:05 pm) SW left a message for patient requesting a call back.  ?

## 2022-05-11 ENCOUNTER — Encounter (HOSPITAL_BASED_OUTPATIENT_CLINIC_OR_DEPARTMENT_OTHER): Payer: BC Managed Care – PPO | Admitting: Internal Medicine

## 2022-05-18 ENCOUNTER — Encounter (HOSPITAL_BASED_OUTPATIENT_CLINIC_OR_DEPARTMENT_OTHER): Payer: BC Managed Care – PPO | Admitting: Internal Medicine

## 2022-05-20 ENCOUNTER — Encounter (HOSPITAL_BASED_OUTPATIENT_CLINIC_OR_DEPARTMENT_OTHER): Payer: BC Managed Care – PPO | Attending: Internal Medicine | Admitting: Internal Medicine

## 2022-05-20 DIAGNOSIS — M8668 Other chronic osteomyelitis, other site: Secondary | ICD-10-CM | POA: Insufficient documentation

## 2022-05-20 DIAGNOSIS — Z89511 Acquired absence of right leg below knee: Secondary | ICD-10-CM | POA: Insufficient documentation

## 2022-05-20 DIAGNOSIS — E1022 Type 1 diabetes mellitus with diabetic chronic kidney disease: Secondary | ICD-10-CM | POA: Diagnosis present

## 2022-05-20 DIAGNOSIS — Z87891 Personal history of nicotine dependence: Secondary | ICD-10-CM | POA: Diagnosis not present

## 2022-05-20 DIAGNOSIS — E1069 Type 1 diabetes mellitus with other specified complication: Secondary | ICD-10-CM | POA: Insufficient documentation

## 2022-05-20 DIAGNOSIS — L89153 Pressure ulcer of sacral region, stage 3: Secondary | ICD-10-CM | POA: Diagnosis not present

## 2022-05-20 DIAGNOSIS — Z940 Kidney transplant status: Secondary | ICD-10-CM | POA: Diagnosis not present

## 2022-05-20 DIAGNOSIS — E10622 Type 1 diabetes mellitus with other skin ulcer: Secondary | ICD-10-CM | POA: Diagnosis not present

## 2022-05-20 NOTE — Progress Notes (Signed)
Doris, Lopez ([id number]) Visit Report for 05/20/2022 Chief Complaint Document Details Patient Name: Date of Service: [name], [name] 05/20/2022 2:15 PM Medical Record Number: [id number] Patient Account Number: [id number] Date of Birth/Sex: Treating RN: [birth year] (61 y.o. [name] Primary Care Provider: [name] Other Clinician: Referring Provider: Treating Provider/Extender: [name] in Treatment: 23 Information Obtained from: Patient Chief Complaint Sacral ulcer Electronic Signature(s) Signed: 05/20/2022 2:53:48 PM By: [name] DO Entered By: [name] on 05/20/2022 14:48:05 -------------------------------------------------------------------------------- HPI Details Patient Name: Date of Service: [name], [name] 05/20/2022 2:15 PM Medical Record Number: [id number] Patient Account Number: [id number] Date of Birth/Sex: Treating RN: Mar 20, 1961 (61 y.o. [name] Primary Care Provider: [name] Other Clinician: Referring Provider: Treating Provider/Extender: [name] in Treatment: 60 History of Present Illness HPI Description: Admission 6/24 [name] is a 61 year old female with a past medical history of type 1 diabetes, right BKA, and kidney transplant that presents to the clinic for an 17-month history of sacral ulcer. She has been using wet-to-dry dressings up until 5 weeks ago when she was started on a wound VAC. 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 She has completed 6 weeks of treatment and states she has been discharged by infectious disease. She currently denies any issues to the wound area. She reports minimal pain. She denies signs of infection. 7/11; patient presents for 2-week follow-up. She uses a wound VAC that is changed with home health 3 times a week. She denies any signs of infection. She has  no complaints or issues today. 8/1; patient presents for follow-up. She continues to use a wound VAC and this is changed with home health. She reports pain to the sacrum that started 1 week ago. She denies systemic signs of infection. 8/15; patient presents for 2-week follow-up. She has been using wet-to-dry dressings daily. She reports improvement in the wound size and appearance. She denies infectious symptoms. 9/12; patient presents for follow-up. She has been using wet-to-dry Dakin's moistened gauze. She reports following up with infectious disease and is currently taking amoxicillin. 10/6; patient presents for follow-up. She has been using Dakin's wet-to-dry moistened gauze dressings. She is still taking amoxicillin. She canceled her appointment with infectious disease yesterday because she is tired of going to doctors appointments. She has not picked up gentamicin cream from the pharmacy to use on the wound bed. She currently denies signs of infection. 11/3; patient presents for follow-up. She has been using Dakin's wet-to-dry dressings. She has no issues or complaints today. She denies signs of infection. 12/1; patient presents for follow-up. She continues to use Dakin's wet-to-dry dressings. Patient followed up with Dr. Tommy Medal, ID and amoxicillin was stopped as she had completed her course for sacral osteomyelitis. She reports itching to the periwound. She denies signs of infection. 1/6; patient presents for follow-up. She has been using Dakin's wet-to-dry dressings. She never received the wound VAC. She reports improvement with ketoconazole to the periwound. She has no issues or complaints today. She denies signs of infection. 3/17; patient presents for follow-up. She has been using Dakin's wet-to-dry dressings. She has no issues or complaints today. 3/30; patient presents for follow-up. She has been using collagen with Dakin's wet-to-dry packing. She has no issues or complaints today. She  does state that she would like to try and do a muscle flap to help close the wound. 4/18; patient presents for follow-up. She changed her  mind on a muscle flap. She would like to try a wound VAC. We went ahead and ordered this and she has been using it for the past week. She has no issues or complaints today. She denies signs of infection. 5/25; patient presents for follow-up. She has been using the wound VAC and home health is coming in Monday Wednesday Friday to change it. She denies signs of infection. Electronic Signature(s) Signed: 05/20/2022 2:53:48 PM By: [name] DO Entered By: [name] on 05/20/2022 14:48:27 -------------------------------------------------------------------------------- Physical Exam Details Patient Name: Date of Service: [name], [name] 05/20/2022 2:15 PM Medical Record Number: [id number] Patient Account Number: [id number] Date of Birth/Sex: Treating RN: [birth year] (61 y.o. [name] Primary Care Provider: [name] Other Clinician: Referring Provider: Treating Provider/Extender: [name]: 11 Constitutional respirations regular, non-labored and within target range for patient.Marland Kitchen Psychiatric pleasant and cooperative. Notes Sacral region: Large open wound with pale granulation tissue present. Undermining circumferentially with increased depth in the center. No signs of surrounding soft tissue infection. Electronic Signature(s) Signed: 05/20/2022 2:53:48 PM By: [name] DO Entered By: [name] on 05/20/2022 14:50:40 -------------------------------------------------------------------------------- Physician Orders Details Patient Name: Date of Service: Doris, Lopez 05/20/2022 2:15 PM Medical Record Number: [id number] Patient Account Number: [id number] Date of Birth/Sex: Treating RN: Jul 23, 1961 (61 y.o. [name] Primary Care Provider: [name] Other  Clinician: Referring Provider: Treating Provider/Extender: [name] in Treatment: 35 Verbal / Phone Orders: No Diagnosis Coding ICD-10 Coding Code Description E10.622 Type 1 diabetes mellitus with other skin ulcer L89.153 Pressure ulcer of sacral region, stage 3 Z89.511 Acquired absence of right leg below knee Z94.0 Kidney transplant status M86.68 Other chronic osteomyelitis, other site Follow-up Appointments Other: - Patient to call wound vac company to pick up. ***Referral to the wound center to [facility] related to possibility for a muscle flap will need to see a plastic surgeon at the wound center. Dr. Vernona Rieger. Their office should call and schedule an appt with you. Please call our office in a week if you do not hear from them.*** Bathing/ Shower/ Hygiene Do not shower or bathe in tub. Negative Presssure Wound Therapy Wound #1 Sacrum Discontinue wound vac - discontinue. Patient to call wound vac company to pick up. Off-Loading Low air-loss mattress (Group 2) - continue to use. Turn and reposition every 2 hours Additional Orders / Instructions Follow [facility] wound care orders this week; continue Home Health for wound care. May utilize formulary equivalent dressing for wound treatment orders unless otherwise specified. - discontinue wound vac. Patient to use Dakin's Solution wet to dry daily dressing. Other Home Health Orders/Instructions: - [facility] home health Wound Treatment Wound #1 - Sacrum Cleanser: Normal Saline (Generic) 1 x Per Day/30 Days Discharge Instructions: Use to moisten collagen. Cleanser: Wound Cleanser (Generic) 1 x Per Day/30 Days Discharge Instructions: Cleanse the wound with wound cleanser prior to applying a clean dressing using gauze sponges, not tissue or cotton balls. Peri-Wound Care: Skin Prep (Generic) 1 x Per Day/30 Days Discharge Instructions: Use skin prep as directed Prim Dressing: Dakin's  Solution wet to dry 1 x Per Day/30 Days ary Discharge Instructions: Apply Dakin's moisten gauze to undermining and wound bed . Secondary Dressing: ABD Pad, 5x9 (Generic) 1 x Per Day/30 Days Discharge Instructions: Apply over primary dressing. IN CLINIC ONLY Secondary Dressing: Woven Gauze Sponge, Non-Sterile 4x4 in (Generic) 1 x Per Day/30 Days Discharge Instructions: Moisten gauze with Dakin's IN  CLINIC ONLY Secured With: 41M Medipore H Soft Cloth Surgical T ape, 4 x 10 (in/yd) (Generic) 1 x Per Day/30 Days Discharge Instructions: Secure with tape as directed. [place]: [phone] - [facility] W[facility] Center Dr. Vernona Rieger- referral for evaluation of plastics and wound care possibility of muscle flap. - (ICD10 L89.153 - Pressure ulcer of sacral region, stage 3) Electronic Signature(s) Signed: 05/20/2022 2:53:48 PM By: [name] DO Entered By: [name] on 05/20/2022 14:50:53 Prescription 05/20/2022 -------------------------------------------------------------------------------- Bing Matter DO Patient Name: Provider: [birth year] [id number] Date of Birth: NPI#Corliss Marcus Sex: DEA #: [phone] Phone #: License #: [place] Patient Address: [address] [place], [place] [address] [place], [place] [address] [phone] Allergies No Known Allergies Provider's [place]: Tool: Z36.644 - [facility] Wound Center Dr. Vernona Rieger- referral for evaluation of plastics and wound care possibility of muscle flap. Hand Signature: Date(s): Electronic Signature(s) Signed: 05/20/2022 2:53:48 PM By: [name] DO Entered By: [name] on 05/20/2022  14:50:53 -------------------------------------------------------------------------------- Problem List Details Patient Name: Date of Service: [name], [name] 05/20/2022 2:15 PM Medical Record Number: [id number] Patient Account Number: [id number] Date of Birth/Sex: Treating RN: December 24, 1961 (61 y.o. [name] Primary Care Provider: [name] Other Clinician: Referring Provider: Treating Provider/Extender: [name]: 32 Active Problems ICD-10 Encounter Code Description Active Date MDM Diagnosis E10.622 Type 1 diabetes mellitus with other skin ulcer 10/29/2021 No Yes L89.153 Pressure ulcer of sacral region, stage 3 06/19/2021 No Yes Z89.511 Acquired absence of right leg below knee 06/19/2021 No Yes Z94.0 Kidney transplant status 06/19/2021 No Yes M86.68 Other chronic osteomyelitis, other site 04/13/2022 No Yes Inactive Problems Resolved Problems Electronic Signature(s) Signed: 05/20/2022 2:53:48 PM By: [name] DO Entered By: [name] on 05/20/2022 14:47:55 -------------------------------------------------------------------------------- Progress Note Details Patient Name: Date of Service: [name], [name] 05/20/2022 2:15 PM Medical Record Number: [id number] Patient Account Number: [id number] Date of Birth/Sex: Treating RN: [birth year] (61 y.o. [name] Primary Care Provider: [name] Other Clinician: Referring Provider: Treating Provider/Extender: [name] in Treatment: 7 Subjective Chief Complaint Information obtained from Patient Sacral ulcer History of Present Illness (HPI) Admission 6/24 Ms. Eilis Chestnutt is a 61 year old female with a past medical history of type 1 diabetes, right BKA, and kidney transplant that presents to the clinic for an 51-month history of sacral ulcer. She has been using wet-to-dry dressings up until 5 weeks ago when she was  started on a wound VAC. 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 She has completed 6 weeks of treatment and states she has been discharged by infectious disease. She currently denies any issues to the wound area. She reports minimal pain. She denies signs of infection. 7/11; patient presents for 2-week follow-up. She uses a wound VAC that is changed with home health 3 times a week. She denies any signs of infection. She has no complaints or issues today. 8/1; patient presents for follow-up. She continues to use a wound VAC and this is changed with home health. She reports pain to the sacrum that started 1 week ago. She denies systemic signs of infection. 8/15; patient presents for 2-week follow-up. She has been using wet-to-dry  dressings daily. She reports improvement in the wound size and appearance. She denies infectious symptoms. 9/12; patient presents for follow-up. She has been using wet-to-dry Dakin's moistened gauze. She reports following up with infectious disease and is currently taking amoxicillin. 10/6; patient presents for follow-up. She has been using Dakin's wet-to-dry moistened gauze dressings. She is still taking amoxicillin. She canceled her appointment with infectious disease yesterday because she is tired of going to doctors appointments. She has not picked up gentamicin cream from the pharmacy to use on the wound bed. She currently denies signs of infection. 11/3; patient presents for follow-up. She has been using Dakin's wet-to-dry dressings. She has no issues or complaints today. She denies signs of infection. 12/1; patient presents for follow-up. She continues to use Dakin's wet-to-dry dressings. Patient followed up with Dr. Tommy Medal, ID and amoxicillin was stopped as she had completed her course for sacral osteomyelitis. She reports itching to the periwound. She denies signs of infection. 1/6; patient presents for follow-up.  She has been using Dakin's wet-to-dry dressings. She never received the wound VAC. She reports improvement with ketoconazole to the periwound. She has no issues or complaints today. She denies signs of infection. 3/17; patient presents for follow-up. She has been using Dakin's wet-to-dry dressings. She has no issues or complaints today. 3/30; patient presents for follow-up. She has been using collagen with Dakin's wet-to-dry packing. She has no issues or complaints today. She does state that she would like to try and do a muscle flap to help close the wound. 4/18; patient presents for follow-up. She changed her mind on a muscle flap. She would like to try a wound VAC. We went ahead and ordered this and she has been using it for the past week. She has no issues or complaints today. She denies signs of infection. 5/25; patient presents for follow-up. She has been using the wound VAC and home health is coming in Monday Wednesday Friday to change it. She denies signs of infection. Patient History Information obtained from Patient. Family History Cancer - Father, Diabetes - Father, Heart Disease - Maternal Grandparents, Hypertension - Mother, Thyroid Problems - Mother,Siblings, No family history of Hereditary Spherocytosis, Kidney Disease, Lung Disease, Seizures, Stroke, Tuberculosis. Social History Former smoker - from teens to 37's, Marital Status - Married, Alcohol Use - Rarely - beer, Drug Use - No History, Caffeine Use - Daily - coffee. Medical History Hematologic/Lymphatic Patient has history of Anemia Endocrine Patient has history of Type I Diabetes - s Genitourinary Patient has history of End Stage Renal Disease - off dialysis since transplants Immunological Denies history of Lupus Erythematosus, Raynaudoos, Scleroderma Integumentary (Skin) Denies history of History of Burn Musculoskeletal Patient has history of Osteoarthritis, Osteomyelitis Neurologic Patient has history of  Neuropathy Oncologic Patient has history of Received Radiation Denies history of Received Chemotherapy Hospitalization/Surgery History - infected wound (pressure) 2022. - (R) BKA amp Oct 2021. - UTI 12/22-12/27/2022. Objective Constitutional respirations regular, non-labored and within target range for patient.. Vitals Time Taken: 2:20 PM, Height: 53 in, Weight: 110 lbs, BMI: 27.5, Temperature: 98.2 F, Pulse: 94 bpm, Respiratory Rate: 16 breaths/min, Blood Pressure: 120/67 mmHg. Psychiatric pleasant and cooperative. General Notes: Sacral region: Large open wound with pale granulation tissue present. Undermining circumferentially with increased depth in the center. No signs of surrounding soft tissue infection. Integumentary (Hair, Skin) Wound #1 status is Open. Original cause of wound was Pressure Injury. The date acquired was: 02/24/2021. The wound has been in treatment 47 weeks. The wound  is located on the Sacrum. The wound measures 4.6cm length x 2.4cm width x 2cm depth; 8.671cm^2 area and 17.342cm^3 volume. There is bone and Fat Layer (Subcutaneous Tissue) exposed. There is no tunneling noted, however, there is undermining starting at 12:00 and ending at 12:00 with a maximum distance of 1.4cm. There is a medium amount of serosanguineous drainage noted. The wound margin is epibole. There is large (67-100%) red, pink granulation within the wound bed. There is no necrotic tissue within the wound bed. 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 Unfortunately the wound has gotten larger. At this time I recommended stopping the wound VAC and going back to Dakin's wet-to-dry dressings. At this time I recommended doing a muscle flap. We will refer her to  plastic surgery at [facility] to discuss and evaluate for potential flap. Patient will continue with their wound care center. She knows to call with any questions or concerns and can be seen as needed for follow-up. Plan Follow-up Appointments: Other: - Patient to call wound vac company to pick up. ***Referral to the wound center to [facility] related to possibility for a muscle flap will need to see a plastic surgeon at the wound center. Dr. Vernona Rieger. Their office should call and schedule an appt with you. Please call our office in a week if you do not hear from them.*** Bathing/ Shower/ Hygiene: Do not shower or bathe in tub. Negative Presssure Wound Therapy: Wound #1 Sacrum: Discontinue wound vac - discontinue. Patient to call wound vac company to pick up. Off-Loading: Low air-loss mattress (Group 2) - continue to use. Turn and reposition every 2 hours Additional Orders / Instructions: Follow Nutrit[facility] Diet Home Health: New wound care orders this week; continue Home Health for wound care. May utilize formulary equivalent dressing for wound treatment orders unless otherwise specified. - discontinue wound vac. Patient to use Dakin's Solution wet to dry daily dressing. Other Home Health Orders/Instructions: - [facility] home health ordered were: [place]: M01.027 - [facility] Wound Center Dr. Vernona Rieger- referral for evaluation of plastics and wound care possibility of muscle flap. WOUND #1: - Sacrum Wound Laterality: Cleanser: Normal Saline (Generic) 1 x Per Day/30 Days Discharge Instructions: Use to moisten collagen. Cleanser: Wound Cleanser (Generic) 1 x Per Day/30 Days Discharge Instructions: Cleanse the wound with wound cleanser prior to applying a clean dressing using gauze sponges, not tissue or cotton balls. Peri-Wound Care: Skin Prep (Generic) 1 x Per Day/30 Days Discharge Instructions: Use skin prep as directed Prim  Dressing: Dakin's Solution wet to dry 1 x Per Day/30 Days ary Discharge Instructions: Apply Dakin's moisten gauze to undermining and wound bed . Secondary Dressing: ABD Pad, 5x9 (Generic) 1 x Per Day/30 Days Discharge Instructions: Apply over primary dressing. IN CLINIC ONLY Secondary Dressing: Woven Gauze Sponge, Non-Sterile 4x4 in (Generic) 1 x Per Day/30 Days Discharge Instructions: Moisten gauze with Dakin's IN CLINIC ONLY Secured With: 42M Medipore H Soft Cloth Surgical T ape, 4 x 10 (in/yd) (Generic) 1 x Per Day/30 Days Discharge Instructions: Secure with tape as directed. IN CLINIC ONLY 1. Stop wound VAC 2. Dakin's wet-to-dry 3. Aggressive offloading 4. Referral to [facility] plastic surgery for evaluation of potential muscle flap Electronic Signature(s)  Signed: 05/20/2022 2:53:48 PM By: [name] DO Entered By: [name] on 05/20/2022 14:53:06 -------------------------------------------------------------------------------- HxROS Details Patient Name: Date of Service: [name], [name] 05/20/2022 2:15 PM Medical Record Number: [id number] Patient Account Number: [id number] Date of Birth/Sex: Treating RN: 01-22-1961 (61 y.o. [name] Primary Care Provider: [name] Other Clinician: Referring Provider: Treating Provider/Extender: [name] in Treatment: 22 Information Obtained From Patient Hematologic/Lymphatic Medical History: Positive for: Anemia Endocrine Medical History: Positive for: Type I Diabetes - s Time with diabetes: 31 years Treated with: Insulin Blood sugar tested every day: Yes Tested : constant with device Genitourinary Medical History: Positive for: End Stage Renal Disease - off dialysis since transplants Immunological Medical History: Negative for: Lupus Erythematosus; Raynauds; Scleroderma Integumentary (Skin) Medical History: Negative for: History of Burn Musculoskeletal Medical  History: Positive for: Osteoarthritis; Osteomyelitis Neurologic Medical History: Positive for: Neuropathy Oncologic Medical History: Positive for: Received Radiation Negative for: Received Chemotherapy Immunizations Pneumococcal Vaccine: Received Pneumococcal Vaccination: Yes Received Pneumococcal Vaccination On or After 60th Birthday: No Immunization Notes: states being up to date on all - unknown dates for immuization Implantable Devices None Hospitalization / Surgery History Type of Hospitalization/Surgery infected wound (pressure) 2022 (R) BKA amp Oct 2021 UTI 12/22-12/27/2022 Family and Social History Cancer: Yes - Father; Diabetes: Yes - Father; Heart Disease: Yes - Maternal Grandparents; Hereditary Spherocytosis: No; Hypertension: Yes - Mother; Kidney Disease: No; Lung Disease: No; Seizures: No; Stroke: No; Thyroid Problems: Yes - Mother,Siblings; Tuberculosis: No; Former smoker - from teens to 52's; Marital Status - Married; Alcohol Use: Rarely - beer; Drug Use: No History; Caffeine Use: Daily - coffee; Financial Concerns: No; Food, Clothing or Shelter Needs: No; Support System Lacking: No; Transportation Concerns: No Electronic Signature(s) Signed: 05/20/2022 2:53:48 PM By: [name] DO Signed: 05/20/2022 5:01:49 PM By: [name] RN, BSN Entered By: [name] on 05/20/2022 14:48:35 -------------------------------------------------------------------------------- [place] Details Patient Name: Date of Service: [name], [name] 05/20/2022 Medical Record Number: [id number] Patient Account Number: [id number] Date of Birth/Sex: Treating RN: April 02, 1961 (61 y.o. [name] Primary Care Provider: [name] Other Clinician: Referring Provider: Treating Provider/Extender: [name] in Treatment: 48 Diagnosis Coding ICD-10 Codes Code Description E10.622 Type 1 diabetes mellitus with other skin ulcer L89.153  Pressure ulcer of sacral region, stage 3 Z89.511 Acquired absence of right leg below knee Z94.0 Kidney transplant status M86.68 Other chronic osteomyelitis, other site Facility Procedures Physician Procedures : CPT4 Code Description Modifier [id number] - WC PHYS LEVEL 3 - EST PT ICD-10 Diagnosis Description E10.622 Type 1 diabetes mellitus with other skin ulcer L89.153 Pressure ulcer of sacral region, stage 3 M86.68 Other chronic osteomyelitis, other site  Z94.0 Kidney transplant status Quantity: 1 Electronic Signature(s) Signed: 05/20/2022 4:29:03 PM By: [name] DO Signed: 05/20/2022 5:01:49 PM By: [name] RN, BSN Previous Signature: 05/20/2022 2:53:48 PM Version By: [name] DO Entered By: [name] on 05/20/2022 16:11:50

## 2022-05-20 NOTE — Progress Notes (Addendum)
[name], [name] ([id number]) Visit Report for 05/20/2022 Arrival Information Details Patient Name: Date of Service: [name], [name] 05/20/2022 2:15 PM Medical Record Number: [id number] Patient Account Number: [id number] Date of Birth/Sex: Treating RN: September 09, 1961 (61 y.o. [name], [name] Primary Care [name]: [name] Other Clinician: Referring [name]: Treating [name]/Extender: [name] in Treatment: 41 Visit Information History Since Last Visit Added or deleted any medications: No Patient Arrived: Wheel Chair Any new allergies or adverse reactions: No Arrival Time: 14:20 Had a fall or experienced change in No Accompanied By: husband activities of daily living that may affect Transfer Assistance: None risk of falls: Patient Identification Verified: Yes Signs or symptoms of abuse/neglect since last visito No Secondary Verification Process Completed: Yes Hospitalized since last visit: No Patient Requires Transmission-Based Precautions: No Implantable device outside of the clinic excluding No Patient Has Alerts: No cellular tissue based products placed in the center since last visit: Has Dressing in Place as Prescribed: Yes Pain Present Now: Yes Electronic Signature(s) Signed: 05/20/2022 5:01:49 PM By: Doris Pilling RN, BSN Entered By: [name] on 05/20/2022 14:22:11 -------------------------------------------------------------------------------- Clinic Level of Care Assessment Details Patient Name: Date of Service: [name] 05/20/2022 2:15 PM Medical Record Number: [id number] Patient Account Number: [id number] Date of Birth/Sex: Treating RN: Jun 11, 1961 (61 y.o. [name], [name] Primary Care Cyenna Rebello: [name] Other Clinician: Referring [name]: Treating [name]/Extender: [name] in Treatment: 6 Clinic Level of Care Assessment Items TOOL 4 Quantity Score X- 1 0 Use when  only an EandM is performed on FOLLOW-UP visit ASSESSMENTS - Nursing Assessment / Reassessment X- 1 10 Reassessment of Co-morbidities (includes updates in patient status) X- 1 5 Reassessment of Adherence to Treatment Plan ASSESSMENTS - Wound and Skin A ssessment / Reassessment X - Simple Wound Assessment / Reassessment - one wound 1 5 []  - 0 Complex Wound Assessment / Reassessment - multiple wounds []  - 0 Dermatologic / Skin Assessment (not related to wound area) ASSESSMENTS - Focused Assessment []  - 0 Circumferential Edema Measurements - multi extremities X- 1 10 Nutritional Assessment / Counseling / Intervention []  - 0 Lower Extremity Assessment (monofilament, tuning fork, pulses) []  - 0 Peripheral Arterial Disease Assessment (using hand held doppler) ASSESSMENTS - Ostomy and/or Continence Assessment and Care []  - 0 Incontinence Assessment and Management []  - 0 Ostomy Care Assessment and Management (repouching, etc.) PROCESS - Coordination of Care X - Simple Patient / Family Education for ongoing care 1 15 []  - 0 Complex (extensive) Patient / Family Education for ongoing care X- 1 10 Staff obtains Programmer, systems, Records, T Results / Process Orders est X- 1 10 Staff telephones HHA, Nursing Homes / Clarify orders / etc []  - 0 Routine Transfer to another Facility (non-emergent condition) []  - 0 Routine Hospital Admission (non-emergent condition) []  - 0 New Admissions / Biomedical engineer / Ordering NPWT Apligraf, etc. , []  - 0 Emergency Hospital Admission (emergent condition) X- 1 10 Simple Discharge Coordination []  - 0 Complex (extensive) Discharge Coordination PROCESS - Special Needs []  - 0 Pediatric / Minor Patient Management []  - 0 Isolation Patient Management []  - 0 Hearing / Language / Visual special needs []  - 0 Assessment of Community assistance (transportation, D/C planning, etc.) []  - 0 Additional assistance / Altered mentation []  - 0 Support  Surface(s) Assessment (bed, cushion, seat, etc.) INTERVENTIONS - Wound Cleansing / Measurement X - Simple Wound Cleansing - one wound 1 5 []  - 0 Complex Wound Cleansing - multiple wounds X- 1  5 Wound Imaging (photographs - any number of wounds) []  - 0 Wound Tracing (instead of photographs) X- 1 5 Simple Wound Measurement - one wound []  - 0 Complex Wound Measurement - multiple wounds INTERVENTIONS - Wound Dressings []  - 0 Small Wound Dressing one or multiple wounds X- 1 15 Medium Wound Dressing one or multiple wounds []  - 0 Large Wound Dressing one or multiple wounds []  - 0 Application of Medications - topical []  - 0 Application of Medications - injection INTERVENTIONS - Miscellaneous []  - 0 External ear exam []  - 0 Specimen Collection (cultures, biopsies, blood, body fluids, etc.) []  - 0 Specimen(s) / Culture(s) sent or taken to Lab for analysis []  - 0 Patient Transfer (multiple staff / Civil Service fast streamer / Similar devices) []  - 0 Simple Staple / Suture removal (25 or less) []  - 0 Complex Staple / Suture removal (26 or more) []  - 0 Hypo / Hyperglycemic Management (close monitor of Blood Glucose) []  - 0 Ankle / Brachial Index (ABI) - do not check if billed separately X- 1 5 Vital Signs Has the patient been seen at the hospital within the last three years: Yes Total Score: 110 Level Of Care: New/Established - Level 3 Electronic Signature(s) Signed: 05/20/2022 5:01:49 PM By: Doris Pilling RN, BSN Entered By: [name] on 05/20/2022 16:11:43 -------------------------------------------------------------------------------- Encounter Discharge Information Details Patient Name: Date of Service: [name], [name] 05/20/2022 2:15 PM Medical Record Number: [id number] Patient Account Number: [id number] Date of Birth/Sex: Treating RN: 01-04-1961 (61 y.o. [name] Primary Care [name]: [name] Other Clinician: Referring [name]: Treating [name]/Extender:  [name] in Treatment: 6 Encounter Discharge Information Items Discharge Condition: Stable Ambulatory Status: Wheelchair Discharge Destination: Home Transportation: Private Auto Accompanied By: husband Schedule Follow-up Appointment: No Clinical Summary of Care: Electronic Signature(s) Signed: 05/20/2022 5:01:49 PM By: Doris Pilling RN, BSN Entered By: [name] on 05/20/2022 16:12:14 -------------------------------------------------------------------------------- Lower Extremity Assessment Details Patient Name: Date of Service: [name], [name] 05/20/2022 2:15 PM Medical Record Number: [id number] Patient Account Number: [id number] Date of Birth/Sex: Treating RN: 08-Oct-1961 (61 y.o. [name] Primary Care Shaye Elling: [name] Other Clinician: Referring Daronte Shostak: Treating Akito Boomhower/Extender: [name] in Treatment: 77 Electronic Signature(s) Signed: 05/20/2022 5:01:49 PM By: Doris Pilling RN, BSN Entered By: [name] on 05/20/2022 14:22:30 -------------------------------------------------------------------------------- Multi Wound Chart Details Patient Name: Date of Service: [name], [name] 05/20/2022 2:15 PM Medical Record Number: [id number] Patient Account Number: [id number] Date of Birth/Sex: Treating RN: 01/24/1961 (61 y.o. [name] Primary Care Jamyrah Saur: [name] Other Clinician: Referring Lula Michaux: Treating Derian Pfost/Extender: [name] in Treatment: 101 Vital Signs Height(in): 53 Pulse(bpm): 94 Weight(lbs): 110 Blood Pressure(mmHg): 120/67 Body Mass Index(BMI): 27.5 Temperature(F): 98.2 Respiratory Rate(breaths/min): 16 Photos: [N/A:N/A] Sacrum N/A N/A Wound Location: Pressure Injury N/A N/A Wounding Event: Pressure Ulcer N/A N/A Primary Etiology: Anemia, Type I Diabetes, End Stage N/A N/A Comorbid History: Renal Disease,  Osteoarthritis, Osteomyelitis, Neuropathy, Received Radiation 02/24/2021 N/A N/A Date Acquired: 34 N/A N/A Weeks of Treatment: Open N/A N/A Wound Status: No N/A N/A Wound Recurrence: 4.6x2.4x2 N/A N/A Measurements L x W x D (cm) 8.671 N/A N/A A (cm) : rea 17.342 N/A N/A Volume (cm) : 64.80% N/A N/A % Reduction in A rea: 53.10% N/A N/A % Reduction in Volume: 12 Starting Position 1 (o'clock): 12 Ending Position 1 (o'clock): 1.4 Maximum Distance 1 (cm): Yes N/A N/A Undermining: Category/Stage IV N/A N/A Classification: Medium N/A N/A Exudate A mount: Serosanguineous  N/A N/A Exudate Type: red, brown N/A N/A Exudate Color: Epibole N/A N/A Wound Margin: Large (67-100%) N/A N/A Granulation A mount: Red, Pink N/A N/A Granulation Quality: None Present (0%) N/A N/A Necrotic A mount: Fat Layer (Subcutaneous Tissue): Yes N/A N/A Exposed Structures: Bone: Yes Fascia: No Tendon: No Muscle: No Joint: No None N/A N/A Epithelialization: Treatment Notes Electronic Signature(s) Signed: 05/20/2022 2:53:48 PM By: [name] DO Signed: 05/20/2022 5:01:49 PM By: Doris Pilling RN, BSN Entered By: [name] on 05/20/2022 14:48:00 -------------------------------------------------------------------------------- Multi-Disciplinary Care Plan Details Patient Name: Date of Service: [name], [name] 05/20/2022 2:15 PM Medical Record Number: [id number] Patient Account Number: [id number] Date of Birth/Sex: Treating RN: Jun 04, 1961 (61 y.o. [name] Primary Care Claris Pech: [name] Other Clinician: Referring Shanece Cochrane: Treating Evens Meno/Extender: [name] in Treatment: 14 Active Inactive Electronic Signature(s) Signed: 05/28/2022 9:04:12 AM By: Doris Pilling RN, BSN Previous Signature: 05/20/2022 5:01:49 PM Version By: Doris Pilling RN, BSN Entered By: [name] on 05/28/2022  09:04:11 -------------------------------------------------------------------------------- Pain Assessment Details Patient Name: Date of Service: [name], [name] 05/20/2022 2:15 PM Medical Record Number: [id number] Patient Account Number: [id number] Date of Birth/Sex: Treating RN: 1961-02-17 (61 y.o. [name] Primary Care Eather Chaires: [name] Other Clinician: Referring Sally Reimers: Treating Maryfrances Portugal/Extender: [name] in Treatment: 74 Active Problems Location of Pain Severity and Description of Pain Patient Has Paino Yes Site Locations Rate the pain. Current Pain Level: 3 Pain Management and Medication Current Pain Management: Medication: No Cold Application: No Rest: No Massage: No Activity: No T.E.N.S.: No Heat Application: No Leg drop or elevation: No Is the Current Pain Management Adequate: Adequate How does your wound impact your activities of daily livingo Sleep: No Bathing: No Appetite: No Relationship With Others: No Bladder Continence: No Emotions: No Bowel Continence: No Work: No Toileting: No Drive: No Dressing: No Hobbies: No Engineer, maintenance) Signed: 05/20/2022 5:01:49 PM By: Doris Pilling RN, BSN Signed: 05/20/2022 5:01:49 PM By: Doris Pilling RN, BSN Entered By: [name] on 05/20/2022 14:22:23 -------------------------------------------------------------------------------- Patient/Caregiver Education Details Patient Name: Date of Service: [name], [name] 5/25/2023andnbsp2:15 PM Medical Record Number: [id number] Patient Account Number: [id number] Date of Birth/Gender: Treating RN: 26-Feb-1961 (61 y.o. [name] Primary Care Physician: [name] Other Clinician: Referring Physician: Treating Physician/Extender: [name] in Treatment: 70 Education Assessment Education Provided To: Patient Education Topics Provided Wound/Skin  Impairment: Handouts: Skin Care Do's and Dont's Methods: Explain/Verbal Responses: Reinforcements needed Electronic Signature(s) Signed: 05/20/2022 5:01:49 PM By: Doris Pilling RN, BSN Entered By: [name] on 05/20/2022 14:23:41 -------------------------------------------------------------------------------- Wound Assessment Details Patient Name: Date of Service: [name], [name] 05/20/2022 2:15 PM Medical Record Number: [id number] Patient Account Number: [id number] Date of Birth/Sex: Treating RN: 08-Dec-1961 (61 y.o. [name], [name] Primary Care Surina Storts: [name] Other Clinician: Referring Quinzell Malcomb: Treating Keoshia Steinmetz/Extender: [name]: 51 Wound Status Wound Number: 1 Primary Pressure Ulcer Etiology: Wound Location: Sacrum Wound Open Wounding Event: Pressure Injury Status: Date Acquired: 02/24/2021 Comorbid Anemia, Type I Diabetes, End Stage Renal Disease, Osteoarthritis, Weeks Of Treatment: 47 History: Osteomyelitis, Neuropathy, Received Radiation Clustered Wound: No Photos Wound Measurements Length: (cm) 4.6 Width: (cm) 2.4 Depth: (cm) 2 Area: (cm) 8.671 Volume: (cm) 17.342 % Reduction in Area: 64.8% % Reduction in Volume: 53.1% Epithelialization: None Tunneling: No Undermining: Yes Starting Position (o'clock): 12 Ending Position (o'clock): 12 Maximum Distance: (cm) 1.4 Wound Description Classification: Category/Stage IV Wound Margin: Epibole Exudate Amount: Medium Exudate Type: Serosanguineous Exudate  Color: red, brown Foul Odor After Cleansing: No Slough/Fibrino No Wound Bed Granulation Amount: Large (67-100%) Exposed Structure Granulation Quality: Red, Pink Fascia Exposed: No Necrotic Amount: None Present (0%) Fat Layer (Subcutaneous Tissue) Exposed: Yes Tendon Exposed: No Muscle Exposed: No Joint Exposed: No Bone Exposed: Yes Treatment Notes Wound #1 (Sacrum) Cleanser Normal  Saline Discharge Instruction: Use to moisten collagen. Wound Cleanser Discharge Instruction: Cleanse the wound with wound cleanser prior to applying a clean dressing using gauze sponges, not tissue or cotton balls. Peri-Wound Care Skin Prep Discharge Instruction: Use skin prep as directed Topical Primary Dressing Dakin's Solution wet to dry Discharge Instruction: Apply Dakin's moisten gauze to undermining and wound bed . Secondary Dressing ABD Pad, 5x9 Discharge Instruction: Apply over primary dressing. IN CLINIC ONLY Woven Gauze Sponge, Non-Sterile 4x4 in Discharge Instruction: Moisten gauze with Dakin's IN CLINIC ONLY Secured With 49M [place] [facility] T ape, 4 x 10 (in/yd) Discharge Instruction: Secure with tape as directed. IN CLINIC ONLY Compression Wrap Compression Stockings Add-Ons Electronic Signature(s) Signed: 05/20/2022 5:01:49 PM By: Doris Pilling RN, BSN Entered By: [name] on 05/20/2022 14:25:58 -------------------------------------------------------------------------------- Vitals Details Patient Name: Date of Service: [name], [name] 05/20/2022 2:15 PM Medical Record Number: [id number] Patient Account Number: [id number] Date of Birth/Sex: Treating RN: August 03, 1961 (61 y.o. [name], [name] Primary Care Lenea Bywater: [name] Other Clinician: Referring Paden Kuras: Treating Nieve Rojero/Extender: [name] in Treatment: 39 Vital Signs Time Taken: 14:20 Temperature (F): 98.2 Height (in): 53 Pulse (bpm): 94 Weight (lbs): 110 Respiratory Rate (breaths/min): 16 Body Mass Index (BMI): 27.5 Blood Pressure (mmHg): 120/67 Reference Range: 80 - 120 mg / dl Electronic Signature(s) Signed: 05/20/2022 5:01:49 PM By: Doris Pilling RN, BSN Entered By: [name] on 05/20/2022 14:22:46

## 2022-05-31 ENCOUNTER — Telehealth: Payer: Self-pay

## 2022-05-31 NOTE — Telephone Encounter (Signed)
Volunteer check in call made for palliative care, no answer 

## 2022-06-26 ENCOUNTER — Encounter (HOSPITAL_COMMUNITY): Payer: Self-pay | Admitting: *Deleted

## 2022-06-26 ENCOUNTER — Emergency Department (HOSPITAL_COMMUNITY)
Admission: EM | Admit: 2022-06-26 | Discharge: 2022-06-26 | Disposition: A | Discharge status: Home / Self Care | Payer: BC Managed Care – PPO | Attending: Emergency Medicine | Admitting: Emergency Medicine

## 2022-06-26 ENCOUNTER — Encounter (HOSPITAL_COMMUNITY): Payer: Self-pay

## 2022-06-26 ENCOUNTER — Other Ambulatory Visit: Payer: Self-pay

## 2022-06-26 DIAGNOSIS — I10 Essential (primary) hypertension: Secondary | ICD-10-CM | POA: Diagnosis not present

## 2022-06-26 DIAGNOSIS — R944 Abnormal results of kidney function studies: Secondary | ICD-10-CM | POA: Diagnosis not present

## 2022-06-26 DIAGNOSIS — Z7982 Long term (current) use of aspirin: Secondary | ICD-10-CM | POA: Insufficient documentation

## 2022-06-26 DIAGNOSIS — Z79899 Other long term (current) drug therapy: Secondary | ICD-10-CM | POA: Diagnosis not present

## 2022-06-26 DIAGNOSIS — E119 Type 2 diabetes mellitus without complications: Secondary | ICD-10-CM | POA: Diagnosis not present

## 2022-06-26 DIAGNOSIS — E039 Hypothyroidism, unspecified: Secondary | ICD-10-CM | POA: Diagnosis not present

## 2022-06-26 DIAGNOSIS — R112 Nausea with vomiting, unspecified: Secondary | ICD-10-CM | POA: Diagnosis present

## 2022-06-26 DIAGNOSIS — N3001 Acute cystitis with hematuria: Secondary | ICD-10-CM | POA: Insufficient documentation

## 2022-06-26 DIAGNOSIS — Z794 Long term (current) use of insulin: Secondary | ICD-10-CM | POA: Diagnosis not present

## 2022-06-26 DIAGNOSIS — D649 Anemia, unspecified: Secondary | ICD-10-CM | POA: Insufficient documentation

## 2022-06-26 LAB — URINALYSIS, ROUTINE W REFLEX MICROSCOPIC
Bilirubin Urine: NEGATIVE
Glucose, UA: NEGATIVE mg/dL
Ketones, ur: NEGATIVE mg/dL
Nitrite: POSITIVE — AB
Protein, ur: 100 mg/dL — AB
Specific Gravity, Urine: 1.011 (ref 1.005–1.030)
WBC, UA: >50 WBC/hpf — ABNORMAL HIGH (ref 0–5)
pH: 8 (ref 5.0–8.0)

## 2022-06-26 LAB — COMPREHENSIVE METABOLIC PANEL
ALT: 10 U/L (ref 0–44)
AST: 16 U/L (ref 15–41)
Albumin: 2.9 g/dL — ABNORMAL LOW (ref 3.5–5.0)
Alkaline Phosphatase: 93 U/L (ref 38–126)
Anion gap: 8 (ref 5–15)
BUN: 39 mg/dL — ABNORMAL HIGH (ref 8–23)
CO2: 22 mmol/L (ref 22–32)
Calcium: 10.7 mg/dL — ABNORMAL HIGH (ref 8.9–10.3)
Chloride: 110 mmol/L (ref 98–111)
Creatinine, Ser: 1.08 mg/dL — ABNORMAL HIGH (ref 0.44–1.00)
GFR, Estimated: 58 mL/min — ABNORMAL LOW (ref >60)
Glucose, Bld: 138 mg/dL — ABNORMAL HIGH (ref 70–99)
Potassium: 4.5 mmol/L (ref 3.5–5.1)
Sodium: 140 mmol/L (ref 135–145)
Total Bilirubin: 0.8 mg/dL (ref 0.3–1.2)
Total Protein: 6.4 g/dL — ABNORMAL LOW (ref 6.5–8.1)

## 2022-06-26 LAB — CBC
HCT: 32.5 % — ABNORMAL LOW (ref 36.0–46.0)
Hemoglobin: 10.3 g/dL — ABNORMAL LOW (ref 12.0–15.0)
MCH: 35.2 pg — ABNORMAL HIGH (ref 26.0–34.0)
MCHC: 31.7 g/dL (ref 30.0–36.0)
MCV: 110.9 fL — ABNORMAL HIGH (ref 80.0–100.0)
Platelets: 268 10*3/uL (ref 150–400)
RBC: 2.93 MIL/uL — ABNORMAL LOW (ref 3.87–5.11)
RDW: 15.1 % (ref 11.5–15.5)
WBC: 6.8 10*3/uL (ref 4.0–10.5)
nRBC: 0 % (ref 0.0–0.2)

## 2022-06-26 LAB — LIPASE, BLOOD: Lipase: 32 U/L (ref 11–51)

## 2022-06-26 MED ORDER — CEPHALEXIN 500 MG PO CAPS: 500.0000 mg | ORAL_CAPSULE | Freq: Four times a day (QID) | ORAL | 0 refills | Status: DC

## 2022-06-26 MED ORDER — HYDROCODONE-ACETAMINOPHEN 5-325 MG PO TABS
1.0000 | ORAL_TABLET | Freq: Once | ORAL | Status: AC
  Administered 2022-06-26: 1 via ORAL
  Filled 2022-06-26: qty 1

## 2022-06-26 MED ORDER — ONDANSETRON HCL 4 MG PO TABS: 4.0000 mg | ORAL_TABLET | Freq: Four times a day (QID) | ORAL | 0 refills | Status: DC | PRN

## 2022-06-26 MED ORDER — CEPHALEXIN 500 MG PO CAPS
500.0000 mg | ORAL_CAPSULE | Freq: Once | ORAL | Status: AC
  Administered 2022-06-26: 500 mg via ORAL
  Filled 2022-06-26: qty 1

## 2022-06-26 MED ORDER — ONDANSETRON 4 MG PO TBDP
4.0000 mg | ORAL_TABLET | Freq: Once | ORAL | Status: AC
  Administered 2022-06-26: 4 mg via ORAL
  Filled 2022-06-26: qty 1

## 2022-06-26 NOTE — ED Provider Triage Note (Signed)
Emergency Medicine Provider Triage Evaluation Note  [name] , a 61 y.o. female  was evaluated in triage.  Pt complains of nausea and vomiting for two days. States she vomited hourly to start but has had some improvement.   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 Still states she can void alone.   Last changed 3 weeks ago.   3 days of "uti sx"  Review of Systems  Positive: NV  Negative: Fever   Physical Exam  BP 106/66 (BP Location: Left Arm)   Pulse 100   Temp 98.4 F (36.9 C) (Oral)   Resp 18   Ht 5\' 3"  (1.6 m)   Wt 56.7 kg   SpO2 95%   BMI 22.14 kg/m  Gen:   Awake, no distress   Resp:  Normal effort  MSK:   Moves extremities without difficulty  Other:  Abd soft NTTP  Medical Decision Making  Medically screening exam initiated at 10:53 AM.  Appropriate orders placed.  [name] was informed that the remainder of the evaluation will be completed by another provider, this initial triage assessment does not replace that evaluation, and the importance of remaining in the ED until their evaluation is complete.  Labs for vomiting  Urine.    Tedd Sias, [place] 06/26/22 1058

## 2022-06-26 NOTE — ED Provider Notes (Signed)
[facility] DEPT Provider Note   CSN: [id number] Arrival date & time: 06/26/22  1016     History No chief complaint on file.   [name] is a 61 y.o. 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  Patient reports ED for 2 days of nausea, vomiting.  Patient reports that she was vomiting on the hour however that is decreased recently.  Patient has a urethral Foley for 2 years due to leg amputation.  Patient reports that she is able to void alone still, has the catheter due to issues getting around.  Patient states that this was last changed 3 weeks ago.  Patient reports that these are typically how her UTIs begin.  Patient denies any known fevers, dysuria, body aches or chills, chest pain or shortness of breath.  HPI     Home Medications Prior to Admission medications   Medication Sig Start Date End Date Taking? Authorizing Provider  cephALEXin (KEFLEX) 500 MG capsule Take 1 capsule (500 mg total) by mouth 4 (four) times daily. 06/26/22  Yes Azucena Cecil, PA-C  ondansetron (ZOFRAN) 4 MG tablet Take 1 tablet (4 mg total) by mouth every 6 (six) hours as needed for nausea or vomiting. 06/26/22  Yes Azucena Cecil, PA-C  acetaminophen (TYLENOL) 325 MG tablet Take 2 tablets (650 mg total) by mouth every 6 (six) hours as needed for mild pain (or Fever >/= 101). Patient taking differently: Take 650 mg by mouth every 6 (six) hours as needed for mild pain or fever. 10/06/20   Nita Sells, MD  ascorbic acid (VITAMIN C) 250 MG tablet Take 1 tablet (250 mg total) by mouth 2 (two) times daily. 12/22/21   Eugenie Filler, MD  aspirin EC 81 MG tablet Take 81 mg by mouth daily. Swallow whole.    [provider]  atorvastatin (LIPITOR) 10 MG tablet Take 1 tablet (10 mg total) by mouth daily. 03/02/21   Georgette Shell, MD  azaTHIOprine (IMURAN) 50 MG tablet Take 1 tablet (50 mg total) by mouth daily. 03/02/21   Georgette Shell, MD  B Complex-C-Folic Acid TABS Take 1 tablet by mouth daily.    [provider]  cholestyramine (QUESTRAN) 4 g packet Take 1 packet (4 g total) by mouth 2 (two) times daily. 03/02/21   Georgette Shell, MD  collagenase (SANTYL) ointment Apply topically daily. Patient taking differently: Apply 1 application topically daily. 03/02/21   Georgette Shell, MD  cycloSPORINE modified (NEORAL) 25 MG capsule Take 75 mg by mouth 2 (two) times daily. 12/13/21   [provider]  HYDROcodone bit-homatropine (HYCODAN) 5-1.5 MG/5ML syrup Take 5 mLs by mouth every 6 (six) hours as needed for cough. 12/22/21   Eugenie Filler, MD  insulin lispro (HUMALOG) 100 UNIT/ML injection Inject 0.02 mLs (2 Units total) into the skin 3 (three) times daily before meals. Sliding scale if 200-250=2 units;251-300=4units;301-350=6 units;351-400=8 units;104-450=10 units;451-500=12 units subcutaneously before meals and at bedtime for DM Patient taking differently: Inject 2-12 Units into the skin 3 (three) times daily before meals. Sliding scale if 200-250=2 units;251-300=4units;301-350=6 units;351-400=8 units;104-450=10 units;451-500=12 units subcutaneously before meals and at bedtime for DM Sliding scale ; via pump 03/02/21   Georgette Shell, MD  ketoconazole (NIZORAL) 2 % cream Apply 1 application topically daily. 11/26/21   [provider]  levothyroxine (SYNTHROID) 88 MCG tablet Take 1 tablet (88 mcg  total) by mouth daily before breakfast. 03/02/21   Georgette Shell, MD  lisinopril (ZESTRIL) 5 MG tablet Take 5 mg by mouth daily. 12/19/19   [provider]  liver oil-zinc oxide (DESITIN) 40 % ointment Apply topically as needed for irritation. Patient taking differently: Apply 1 application topically daily as needed for irritation. 03/02/21   Georgette Shell, MD   loperamide (IMODIUM) 2 MG capsule Take 1 capsule (2 mg total) by mouth as needed for diarrhea or loose stools. Patient taking differently: Take 2 mg by mouth daily as needed for diarrhea or loose stools. 03/02/21   Georgette Shell, MD  ondansetron (ZOFRAN-ODT) 4 MG disintegrating tablet Take 1 tablet (4 mg total) by mouth every 4 (four) hours as needed for nausea or vomiting. 03/02/21   Georgette Shell, MD  oxyCODONE (OXY IR/ROXICODONE) 5 MG immediate release tablet Take 1 tablet (5 mg total) by mouth every 6 (six) hours as needed for severe pain. 03/02/21   Georgette Shell, MD  pantoprazole (PROTONIX) 40 MG tablet Take 1 tablet (40 mg total) by mouth 2 (two) times daily. Take 1 tablet twice daily x1 month, then 1 tablet daily thereafter. 12/22/21   Eugenie Filler, MD  predniSONE (DELTASONE) 5 MG tablet Take 1 tablet (5 mg total) by mouth daily with breakfast. 03/02/21   Georgette Shell, MD  saccharomyces boulardii (FLORASTOR) 250 MG capsule Take 1 capsule (250 mg total) by mouth 2 (two) times daily. Patient not taking: Reported on 12/17/2021 03/02/21   Georgette Shell, MD  sertraline (ZOLOFT) 50 MG tablet Take 50 mg by mouth daily.    [provider]  sodium hypochlorite (DAKIN'S 1/2 STRENGTH) external solution Apply 1 application topically daily. 05/14/21   [provider]  sucralfate (CARAFATE) 1 GM/10ML suspension Take 10 mLs (1 g total) by mouth 4 (four) times daily -  with meals and at bedtime. 12/22/21 01/21/22  Eugenie Filler, MD  triamcinolone (NASACORT) 55 MCG/ACT AERO nasal inhaler USE 1 SPRAY IN [facility] NOSTRIL Patient not taking: Reported on 12/17/2021 07/13/21   Rozetta Nunnery, MD      Allergies    Patient has no known allergies.    Review of Systems   Review of Systems  Constitutional:  Negative for chills and fever.  Gastrointestinal:  Positive for nausea and vomiting.  Genitourinary:  Negative for dysuria.  All other systems reviewed  and are negative.   Physical Exam Updated Vital Signs BP (!) 106/59 (BP Location: Right Arm)   Pulse (!) 106   Temp 98.4 F (36.9 C) (Oral)   Resp 18   Ht 5\' 3"  (1.6 m)   Wt 56.7 kg   SpO2 99%   BMI 22.14 kg/m  Physical Exam Vitals and nursing note reviewed.  Constitutional:      General: She is not in acute distress.    Appearance: Normal appearance. She is not ill-appearing, toxic-appearing or diaphoretic.  HENT:     Head: Normocephalic and atraumatic.     Nose: Nose normal. No congestion.  Eyes:     Conjunctiva/sclera: Conjunctivae normal.     Pupils: Pupils are equal, round, and reactive to light.  Cardiovascular:     Rate and Rhythm: Normal rate and regular rhythm.  Pulmonary:     Effort: Pulmonary effort is normal.     Breath sounds: Normal breath sounds. No wheezing.  Abdominal:     General: Abdomen is flat. Bowel sounds are normal.  Palpations: Abdomen is soft.     Tenderness: There is no abdominal tenderness.  Musculoskeletal:     Cervical back: Normal range of motion and neck supple. No tenderness.  Skin:    General: Skin is warm and dry.     Capillary Refill: Capillary refill takes less than 2 seconds.  Neurological:     Mental Status: She is alert and oriented to person, place, and time.     ED Results / Procedures / Treatments   Labs (all labs ordered are listed, but only abnormal results are displayed) Labs Reviewed  COMPREHENSIVE METABOLIC PANEL - Abnormal; Notable for the following components:      Result Value   Glucose, Bld 138 (*)    BUN 39 (*)    Creatinine, Ser 1.08 (*)    Calcium 10.7 (*)    Total Protein 6.4 (*)    Albumin 2.9 (*)    GFR, Estimated 58 (*)    All other components within normal limits  CBC - Abnormal; Notable for the following components:   RBC 2.93 (*)    Hemoglobin 10.3 (*)    HCT 32.5 (*)    MCV 110.9 (*)    MCH 35.2 (*)    All other components within normal limits  URINALYSIS, ROUTINE W REFLEX MICROSCOPIC -  Abnormal; Notable for the following components:   APPearance CLOUDY (*)    Hgb urine dipstick MODERATE (*)    Protein, ur 100 (*)    Nitrite POSITIVE (*)    Leukocytes,Ua LARGE (*)    WBC, UA >50 (*)    Bacteria, UA FEW (*)    All other components within normal limits  LIPASE, BLOOD    EKG None  Radiology No results found.  Procedures Procedures   Medications Ordered in ED Medications  ondansetron (ZOFRAN-ODT) disintegrating tablet 4 mg (has no administration in time range)  HYDROcodone-acetaminophen (NORCO/VICODIN) 5-325 MG per tablet 1 tablet (has no administration in time range)  cephALEXin (KEFLEX) capsule 500 mg (has no administration in time range)    ED Course/ Medical Decision Making/ A&P                           Medical Decision Making Risk Prescription drug management.   61 year old female presents to the ED for evaluation.  Please see HPI for further details.  On examination, patient afebrile and nontachycardic.  The patient lung sounds are clear bilaterally, not hypoxic on room air.  The patient's abdomen is soft and compressible in all 4 quadrants.  Patient neurological examination shows no focal neurodeficits.  Patient worked up utilizing the following labs and imaging studies interpreted by me personally: - Urinalysis shows nitrite positive urine, large leukocytes.  The patient urine will be cultured.  The patient was started on 10 days of Keflex 4 times daily.  Patient given first dose here in the department. - CMP shows elevated creatinine of 1.08 which is in line with the patient's baseline - CBC unremarkable, no leukocytosis.  Hemoglobin 10.3 however this is improved from patient baseline. - Lipase unremarkable  Patient be discharged home with 10 days of Keflex which she will take 4 times daily.  Patient given return precautions and she voiced understanding.  The patient had all of her questions answered to her satisfaction.  The patient is stable at  this time for discharge home.   Final Clinical Impression(s) / ED Diagnoses Final diagnoses:  Acute cystitis with hematuria  Rx / DC Orders ED Discharge Orders          Ordered    cephALEXin (KEFLEX) 500 MG capsule  4 times daily        06/26/22 1805    ondansetron (ZOFRAN) 4 MG tablet  Every 6 hours PRN        06/26/22 1805              Lawana Chambers 06/26/22 Elson Clan, MD 06/26/22 [phone]

## 2022-06-26 NOTE — ED Notes (Signed)
Lab called to add on Urine Culture

## 2022-06-26 NOTE — ED Triage Notes (Signed)
Cough  started 2 days ago which induces vomiting, and has abd discomfort too. Pt has a foley and s/o states this is they way her UTIs start.

## 2022-06-26 NOTE — Discharge Instructions (Addendum)
Please return to the ED with any new or worsening symptoms such as fevers, nausea or vomiting Please take antibiotics to course completion.  You will take these 4 times daily as we discussed. Please remain hydrated, continue to push fluids. Please attach informational guide concerning UTIs

## 2022-07-02 LAB — URINE CULTURE: Culture: >=100000 — AB

## 2022-07-03 ENCOUNTER — Telehealth (HOSPITAL_BASED_OUTPATIENT_CLINIC_OR_DEPARTMENT_OTHER): Payer: Self-pay | Admitting: *Deleted

## 2022-07-03 NOTE — Telephone Encounter (Signed)
Post ED Visit - Positive Culture Follow-up  Culture report reviewed by antimicrobial stewardship pharmacist: [place] Team []  Elenor Quinones, Pharm.D. []  Heide Guile, Pharm.D., BCPS AQ-ID []  Parks Neptune, Pharm.D., BCPS []  Alycia Rossetti, Pharm.D., BCPS []  [place], [place].D., BCPS, AAHIVP []  Legrand Como, Pharm.D., BCPS, AAHIVP []  Salome Arnt, PharmD, BCPS []  Johnnette Gourd, PharmD, BCPS []  Hughes Better, PharmD, BCPS []  Leeroy Cha, PharmD []  Laqueta Linden, PharmD, BCPS []  Albertina Parr, PharmD  [place] Team []  Leodis Sias, PharmD []  Lindell Spar, PharmD []  Royetta Asal, PharmD []  Graylin Shiver, Rph []  Rema Fendt) Glennon Mac, PharmD []  Arlyn Dunning, PharmD []  Netta Cedars, PharmD []  Dia Sitter, PharmD []  Leone Haven, PharmD []  Gretta Arab, PharmD []  Theodis Shove, PharmD []  Peggyann Juba, PharmD [x]  Suzzanne Cloud, PharmD   Positive urine culture Treated with Cephalexin, organism sensitive to the same and no further patient follow-up is required at this time.  Rosie Fate 07/03/2022, 10:53 AM

## 2022-09-25 ENCOUNTER — Encounter (HOSPITAL_COMMUNITY): Payer: Self-pay

## 2022-09-25 ENCOUNTER — Other Ambulatory Visit: Payer: Self-pay

## 2022-09-25 ENCOUNTER — Emergency Department (HOSPITAL_COMMUNITY)
Admission: EM | Admit: 2022-09-25 | Discharge: 2022-09-25 | Disposition: A | Discharge status: Home / Self Care | Payer: BC Managed Care – PPO | Attending: Emergency Medicine | Admitting: Emergency Medicine

## 2022-09-25 DIAGNOSIS — T839XXA Unspecified complication of genitourinary prosthetic device, implant and graft, initial encounter: Secondary | ICD-10-CM

## 2022-09-25 DIAGNOSIS — Y69 Unspecified misadventure during surgical and medical care: Secondary | ICD-10-CM | POA: Insufficient documentation

## 2022-09-25 DIAGNOSIS — T83098A Other mechanical complication of other indwelling urethral catheter, initial encounter: Secondary | ICD-10-CM | POA: Insufficient documentation

## 2022-09-25 DIAGNOSIS — R109 Unspecified abdominal pain: Secondary | ICD-10-CM | POA: Insufficient documentation

## 2022-09-25 DIAGNOSIS — Z7982 Long term (current) use of aspirin: Secondary | ICD-10-CM | POA: Diagnosis not present

## 2022-09-25 DIAGNOSIS — N3090 Cystitis, unspecified without hematuria: Secondary | ICD-10-CM

## 2022-09-25 LAB — CBC WITH DIFFERENTIAL/PLATELET
Abs Immature Granulocytes: 0.01 10*3/uL (ref 0.00–0.07)
Basophils Absolute: 0 10*3/uL (ref 0.0–0.1)
Basophils Relative: 0 %
Eosinophils Absolute: 0.1 10*3/uL (ref 0.0–0.5)
Eosinophils Relative: 3 %
HCT: 34.9 % — ABNORMAL LOW (ref 36.0–46.0)
Hemoglobin: 11 g/dL — ABNORMAL LOW (ref 12.0–15.0)
Immature Granulocytes: 0 %
Lymphocytes Relative: 40 %
Lymphs Abs: 1.8 10*3/uL (ref 0.7–4.0)
MCH: 34.6 pg — ABNORMAL HIGH (ref 26.0–34.0)
MCHC: 31.5 g/dL (ref 30.0–36.0)
MCV: 109.7 fL — ABNORMAL HIGH (ref 80.0–100.0)
Monocytes Absolute: 0.3 10*3/uL (ref 0.1–1.0)
Monocytes Relative: 7 %
Neutro Abs: 2.2 10*3/uL (ref 1.7–7.7)
Neutrophils Relative %: 50 %
Platelets: 265 10*3/uL (ref 150–400)
RBC: 3.18 MIL/uL — ABNORMAL LOW (ref 3.87–5.11)
RDW: 15 % (ref 11.5–15.5)
WBC: 4.5 10*3/uL (ref 4.0–10.5)
nRBC: 0 % (ref 0.0–0.2)

## 2022-09-25 LAB — URINALYSIS, ROUTINE W REFLEX MICROSCOPIC
Bilirubin Urine: NEGATIVE
Glucose, UA: >=500 mg/dL — AB
Ketones, ur: NEGATIVE mg/dL
Nitrite: NEGATIVE
Protein, ur: NEGATIVE mg/dL
Specific Gravity, Urine: 1.007 (ref 1.005–1.030)
pH: 7 (ref 5.0–8.0)

## 2022-09-25 LAB — BASIC METABOLIC PANEL
Anion gap: 4 — ABNORMAL LOW (ref 5–15)
BUN: 51 mg/dL — ABNORMAL HIGH (ref 8–23)
CO2: 24 mmol/L (ref 22–32)
Calcium: 9.2 mg/dL (ref 8.9–10.3)
Chloride: 111 mmol/L (ref 98–111)
Creatinine, Ser: 1.54 mg/dL — ABNORMAL HIGH (ref 0.44–1.00)
GFR, Estimated: 38 mL/min — ABNORMAL LOW (ref >60)
Glucose, Bld: 350 mg/dL — ABNORMAL HIGH (ref 70–99)
Potassium: 4.8 mmol/L (ref 3.5–5.1)
Sodium: 139 mmol/L (ref 135–145)

## 2022-09-25 MED ORDER — CEPHALEXIN 500 MG PO CAPS
500.0000 mg | ORAL_CAPSULE | Freq: Once | ORAL | Status: AC
  Administered 2022-09-25: 500 mg via ORAL
  Filled 2022-09-25: qty 1

## 2022-09-25 MED ORDER — CEPHALEXIN 500 MG PO CAPS: 500.0000 mg | ORAL_CAPSULE | Freq: Two times a day (BID) | ORAL | 0 refills | Status: AC

## 2022-09-25 NOTE — ED Provider Notes (Signed)
[facility] DEPT Provider Note   CSN: [id number] Arrival date & time: 09/25/22  1432     History No chief complaint on file.   [name] is a 61 y.o. female with a chronic indwelling catheter presenting with concern for no drainage from her catheter.  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  At that time she started to have difficulty making it to the restroom so a Foley catheter was placed.  She has this exchanged with urology with [facility] monthly.  She is unsure how long she will need to continue to have this.  Says that earlier this morning she noted that the catheter was not draining, potentially not draining for the majority of yesterday.  HPI     Home Medications Prior to Admission medications   Medication Sig Start Date End Date Taking? Authorizing Provider  acetaminophen (TYLENOL) 325 MG tablet Take 2 tablets (650 mg total) by mouth every 6 (six) hours as needed for mild pain (or Fever >/= 101). Patient taking differently: Take 650 mg by mouth every 6 (six) hours as needed for mild pain or fever. 10/06/20   Nita Sells, MD  ascorbic acid (VITAMIN C) 250 MG tablet Take 1 tablet (250 mg total) by mouth 2 (two) times daily. 12/22/21   Eugenie Filler, MD  aspirin EC 81 MG tablet Take 81 mg by mouth daily. Swallow whole.    [provider]  atorvastatin (LIPITOR) 10 MG tablet Take 1 tablet (10 mg total) by mouth daily. 03/02/21   Georgette Shell, MD  azaTHIOprine (IMURAN) 50 MG tablet Take 1 tablet (50 mg total) by mouth daily. 03/02/21   Georgette Shell, MD  B Complex-C-Folic Acid TABS Take 1 tablet by mouth daily.    [provider]  cephALEXin (KEFLEX) 500 MG capsule Take 1 capsule (500 mg total) by mouth 4 (four) times daily. 06/26/22   Azucena Cecil, PA-C  cholestyramine (QUESTRAN) 4 g packet Take 1 packet (4 g total) by mouth 2 (two) times daily. 03/02/21   Georgette Shell, MD   collagenase (SANTYL) ointment Apply topically daily. Patient taking differently: Apply 1 application topically daily. 03/02/21   Georgette Shell, MD  cycloSPORINE modified (NEORAL) 25 MG capsule Take 75 mg by mouth 2 (two) times daily. 12/13/21   [provider]  HYDROcodone bit-homatropine (HYCODAN) 5-1.5 MG/5ML syrup Take 5 mLs by mouth every 6 (six) hours as needed for cough. 12/22/21   Eugenie Filler, MD  insulin lispro (HUMALOG) 100 UNIT/ML injection Inject 0.02 mLs (2 Units total) into the skin 3 (three) times daily before meals. Sliding scale if 200-250=2 units;251-300=4units;301-350=6 units;351-400=8 units;104-450=10 units;451-500=12 units subcutaneously before meals and at bedtime for DM Patient taking differently: Inject 2-12 Units into the skin 3 (three) times daily before meals. Sliding scale if 200-250=2 units;251-300=4units;301-350=6 units;351-400=8 units;104-450=10 units;451-500=12 units subcutaneously before meals and at bedtime for DM Sliding scale ; via pump 03/02/21   Georgette Shell, MD  ketoconazole (NIZORAL) 2 % cream Apply 1 application topically daily. 11/26/21   [provider]  levothyroxine (SYNTHROID) 88 MCG tablet Take 1 tablet (88 mcg total) by mouth daily before breakfast. 03/02/21   Georgette Shell, MD  lisinopril (ZESTRIL) 5 MG tablet Take 5 mg by mouth daily. 12/19/19   [provider]  liver oil-zinc oxide (DESITIN) 40 % ointment Apply topically as needed for irritation. Patient taking differently: Apply 1 application topically daily as  needed for irritation. 03/02/21   Georgette Shell, MD  loperamide (IMODIUM) 2 MG capsule Take 1 capsule (2 mg total) by mouth as needed for diarrhea or loose stools. Patient taking differently: Take 2 mg by mouth daily as needed for diarrhea or loose stools. 03/02/21   Georgette Shell, MD  ondansetron (ZOFRAN) 4 MG tablet Take 1 tablet (4 mg total) by mouth every 6 (six) hours as needed for  nausea or vomiting. 06/26/22   Azucena Cecil, PA-C  ondansetron (ZOFRAN-ODT) 4 MG disintegrating tablet Take 1 tablet (4 mg total) by mouth every 4 (four) hours as needed for nausea or vomiting. 03/02/21   Georgette Shell, MD  oxyCODONE (OXY IR/ROXICODONE) 5 MG immediate release tablet Take 1 tablet (5 mg total) by mouth every 6 (six) hours as needed for severe pain. 03/02/21   Georgette Shell, MD  pantoprazole (PROTONIX) 40 MG tablet Take 1 tablet (40 mg total) by mouth 2 (two) times daily. Take 1 tablet twice daily x1 month, then 1 tablet daily thereafter. 12/22/21   Eugenie Filler, MD  predniSONE (DELTASONE) 5 MG tablet Take 1 tablet (5 mg total) by mouth daily with breakfast. 03/02/21   Georgette Shell, MD  saccharomyces boulardii (FLORASTOR) 250 MG capsule Take 1 capsule (250 mg total) by mouth 2 (two) times daily. Patient not taking: Reported on 12/17/2021 03/02/21   Georgette Shell, MD  sertraline (ZOLOFT) 50 MG tablet Take 50 mg by mouth daily.    [provider]  sodium hypochlorite (DAKIN'S 1/2 STRENGTH) external solution Apply 1 application topically daily. 05/14/21   [provider]  sucralfate (CARAFATE) 1 GM/10ML suspension Take 10 mLs (1 g total) by mouth 4 (four) times daily -  with meals and at bedtime. 12/22/21 01/21/22  Eugenie Filler, MD  triamcinolone (NASACORT) 55 MCG/ACT AERO nasal inhaler USE 1 SPRAY IN [facility] NOSTRIL Patient not taking: Reported on 12/17/2021 07/13/21   Rozetta Nunnery, MD      Allergies    Patient has no known allergies.    Review of Systems   Review of Systems  Physical Exam Updated Vital Signs BP 139/76 (BP Location: Left Arm)   Pulse 82   Temp 98.2 F (36.8 C) (Oral)   Resp 18   Ht 5\' 3"  (1.6 m)   Wt 59 kg   SpO2 98%   BMI 23.03 kg/m  Physical Exam Vitals and nursing note reviewed.  Constitutional:      Appearance: Normal appearance.  HENT:     Head: Normocephalic and atraumatic.  Eyes:      General: No scleral icterus.    Conjunctiva/sclera: Conjunctivae normal.  Pulmonary:     Effort: Pulmonary effort is normal. No respiratory distress.  Abdominal:     General: Abdomen is flat.     Palpations: Abdomen is soft.     Tenderness: There is no abdominal tenderness.  Genitourinary:    Comments: Patient undressed when he walked in the room.  Urine in bag normal appearance.  No suprapubic tenderness.  Catheter appears to be in place Skin:    Findings: No rash.  Neurological:     Mental Status: She is alert.  Psychiatric:        Mood and Affect: Mood normal.     ED Results / Procedures / Treatments   Labs (all labs ordered are listed, but only abnormal results are displayed) Labs Reviewed  BASIC METABOLIC PANEL - Abnormal; Notable for the following  components:      Result Value   Glucose, Bld 350 (*)    BUN 51 (*)    Creatinine, Ser 1.54 (*)    GFR, Estimated 38 (*)    Anion gap 4 (*)    All other components within normal limits  CBC WITH DIFFERENTIAL/PLATELET - Abnormal; Notable for the following components:   RBC 3.18 (*)    Hemoglobin 11.0 (*)    HCT 34.9 (*)    MCV 109.7 (*)    MCH 34.6 (*)    All other components within normal limits  URINE CULTURE  URINALYSIS, ROUTINE W REFLEX MICROSCOPIC    EKG None  Radiology No results found.  Procedures Procedures   Medications Ordered in ED Medications  cephALEXin (KEFLEX) capsule 500 mg (has no administration in time range)    ED Course/ Medical Decision Making/ A&P                           Medical Decision Making Amount and/or Complexity of Data Reviewed Labs: ordered.  Risk Prescription drug management.   61 year old female with a past medical history of a BKA resulting in a chronic indwelling catheter presenting today with concern of no drainage from her catheter.  No fevers or chills.  No abdominal pain, nausea or vomiting.  Says that she is just concerned because this is happened previously  and started to cause her pain and infection.  Per chart review patient has had multiple visits to the emergency department for similar.  Each time she has required an exchange Of her Foley catheter.  Per external chart review she follows closely with [facility] urology.  She has monthly nursing visits where her Foley catheter is exchanged.  Last visit for this was August 24.  She also had a visit 10 days ago for leaking catheter but her catheter was not replaced at that time.  Likely time for replacement.  On physical exam patient has no suprapubic tenderness.  Bladder scanner was 574.  Nursing staff was instructed to replace patient's Foley catheter.  Work-up: Lab work was ordered in triage.  Patient has a creatinine of 1.54, somewhat elevated from her 1.08 3 months ago however she has multiple previous readings on par with 1.54.  History of chronic kidney disease. UA consistently revealing of UTI.    Do not believe this needs further work-up or admission at this time.  She has no leukocytosis is afebrile and not tachycardic.  Urology office appears to treat her with a one-time p.o. Keflex dose when she has her catheter exchange.  We will do the same and discharge the patient home with the remainder of Keflex course.  She is agreeable.  We discussed the importance of follow-up with her urologist to discuss how long she needs to have an indwelling Foley catheter.  She is unsure if she wants to continue to deal with this and I believe is reasonable to have this discussion with her specialist.   Final Clinical Impression(s) / ED Diagnoses Final diagnoses:  Problem with Foley catheter, initial encounter ([place])    Rx / DC Orders ED Discharge Orders          Ordered    cephALEXin (KEFLEX) 500 MG capsule  2 times daily        09/25/22 1752           Results and diagnoses were explained to the patient. Return precautions discussed in full. Patient had  no additional questions and expressed complete  understanding.   This chart was dictated using voice recognition software.  Despite best efforts to proofread,  errors can occur which can change the documentation meaning.     Rhae Hammock, PA-C 09/25/22 1754    Godfrey Pick, MD 09/26/22 1310

## 2022-09-25 NOTE — Discharge Instructions (Addendum)
Your Foley catheter was changed today.  We also gave you the same antibiotic that they usually give you with [facility].  Please call them on Monday to discuss your frequent catheter problems.  They are also a good team to discuss whether or not you need to have an indwelling catheter for the rest of your life.  Return to the nearest emergency department with any further problems.  The remainder of your antibiotic for urinary tract infections is at your pharmacy.

## 2022-09-25 NOTE — ED Triage Notes (Signed)
Patient's husband  states he tried to irrigate the patient's foley cath with out success. No urine flow since last night.

## 2022-09-25 NOTE — ED Notes (Signed)
Bladder scan showed 574mL's of urine present.

## 2022-09-25 NOTE — ED Provider Triage Note (Signed)
Emergency Medicine Provider Triage Evaluation Note  [name] , a 61 y.o. female  was evaluated in triage.  Pt complains of catheter not working. She has had foley catheter in for at least a year. This morning at 0400 she noticed it hadn't been draining. She thinks that it is supposed to come out soon. Denies any fevers, chills, abdominal pain, flank pain, hematuria.  Review of Systems  Positive:  Negative:   Physical Exam  BP 139/76 (BP Location: Left Arm)   Pulse 82   Temp 98.2 F (36.8 C) (Oral)   Resp 18   Ht 5\' 3"  (1.6 m)   Wt 59 kg   SpO2 98%   BMI 23.03 kg/m  Gen:   Awake, no distress   Resp:  Normal effort  MSK:   Moves extremities without difficulty  Other:    Medical Decision Making  Medically screening exam initiated at 3:14 PM.  Appropriate orders placed.  Iola Turri was informed that the remainder of the evaluation will be completed by another provider, this initial triage assessment does not replace that evaluation, and the importance of remaining in the ED until their evaluation is complete.     Adolphus Birchwood, PA-C 09/25/22 1515

## 2022-09-28 LAB — URINE CULTURE: Culture: >=100000 — AB

## 2022-11-12 ENCOUNTER — Encounter (HOSPITAL_BASED_OUTPATIENT_CLINIC_OR_DEPARTMENT_OTHER): Payer: BC Managed Care – PPO | Attending: Internal Medicine | Admitting: Internal Medicine

## 2022-11-12 DIAGNOSIS — Z87891 Personal history of nicotine dependence: Secondary | ICD-10-CM | POA: Diagnosis not present

## 2022-11-12 DIAGNOSIS — L89153 Pressure ulcer of sacral region, stage 3: Secondary | ICD-10-CM | POA: Diagnosis not present

## 2022-11-12 DIAGNOSIS — E10622 Type 1 diabetes mellitus with other skin ulcer: Secondary | ICD-10-CM | POA: Insufficient documentation

## 2022-11-12 DIAGNOSIS — Z89511 Acquired absence of right leg below knee: Secondary | ICD-10-CM | POA: Diagnosis not present

## 2022-11-12 DIAGNOSIS — M8668 Other chronic osteomyelitis, other site: Secondary | ICD-10-CM | POA: Diagnosis not present

## 2022-11-12 DIAGNOSIS — Z940 Kidney transplant status: Secondary | ICD-10-CM | POA: Insufficient documentation

## 2022-11-12 NOTE — Progress Notes (Signed)
JASREET, DICKIE ([id number]) [id number] Page 1 of 4 Visit Report for 11/12/2022 Abuse Risk Screen Details Patient Name: Date of Service: [name], [name] 11/12/2022 9:45 A M Medical Record Number: [id number] Patient Account Number: [id number] Date of Birth/Sex: Treating RN: [birth year] (61 y.o. Female) Rhae Hammock Primary Care Lazette Estala: [name] Other Clinician: Referring Julious Langlois: Treating Laketta Soderberg/Extender: [name]: 0 Abuse Risk Screen Items Answer ABUSE RISK SCREEN: Has anyone close to you tried to hurt or harm you recentlyo No Do you feel uncomfortable with anyone in your familyo No Has anyone forced you do things that you didnt want to doo No Electronic Signature(s) Signed: 11/12/2022 12:09:11 PM By: Rhae Hammock RN Entered By: Rhae Hammock on 11/12/2022 10:09:02 -------------------------------------------------------------------------------- Activities of Daily Living Details Patient Name: Date of Service: [name], [name] 11/12/2022 9:45 A M Medical Record Number: [id number] Patient Account Number: [id number] Date of Birth/Sex: Treating RN: Dec 22, 1961 (61 y.o. Female) Rhae Hammock Primary Care Calixto Pavel: [name] Other Clinician: Referring Samiha Denapoli: Treating Mikhia Dusek/Extender: [name]: 0 Activities of Daily Living Items Answer Activities of Daily Living (Please select one for each item) Drive Automobile Not Able T Medications ake Need Assistance Use T elephone Need Assistance Care for Appearance Need Assistance Use T oilet Need Assistance Bath / Shower Need Assistance Dress Self Need Assistance Feed Self Need Assistance Walk Need Assistance Get In / Out Bed Need Assistance Housework Need Assistance Prepare Meals Need Assistance Handle Money Need Assistance Shop for Self Need Assistance Electronic  Signature(s) Signed: 11/12/2022 12:09:11 PM By: Rhae Hammock RN Entered By: Rhae Hammock on 11/12/2022 10:09:49 Thressa Sheller ([id number]) [id number] Page 2 of 4 -------------------------------------------------------------------------------- Education Screening Details Patient Name: Date of Service: [name], [name] 11/12/2022 9:45 A M Medical Record Number: [id number] Patient Account Number: [id number] Date of Birth/Sex: Treating RN: Aug 08, 1961 (61 y.o. Female) Rhae Hammock Primary Care Brance Dartt: [name] Other Clinician: Referring Darrius Montano: Treating Nishtha Raider/Extender: [name] in Treatment: 0 Primary Learner Assessed: Patient Learning Preferences/Education Level/Primary Language Learning Preference: Explanation, Demonstration, Communication Board, Printed Material Highest Education Level: College or Above Preferred Language: English Cognitive Barrier Language Barrier: No Translator Needed: No Memory Deficit: No Emotional Barrier: No Cultural/Religious Beliefs Affecting Medical Care: No Physical Barrier Impaired Vision: Yes Glasses Impaired Hearing: No Decreased Hand dexterity: No Knowledge/Comprehension Knowledge Level: High Comprehension Level: High Ability to understand written instructions: High Ability to understand verbal instructions: High Motivation Anxiety Level: Calm Cooperation: Cooperative Education Importance: Denies Need Interest in Health Problems: Asks Questions Perception: Coherent Willingness to Engage in Self-Management High Activities: Readiness to Engage in Self-Management High Activities: Electronic Signature(s) Signed: 11/12/2022 12:09:11 PM By: Rhae Hammock RN Entered By: Rhae Hammock on 11/12/2022 10:10:37 -------------------------------------------------------------------------------- Fall Risk Assessment Details Patient Name: Date of  Service: LARIN, DEPAOLI 11/12/2022 9:45 A M Medical Record Number: [id number] Patient Account Number: [id number] Date of Birth/Sex: Treating RN: [birth year] (60 y.o. Female) Rhae Hammock Primary Care Henri Guedes: [name] Other Clinician: Referring Muskan Bolla: Treating Jesslyn Viglione/Extender: [name]: 0 Fall Risk Assessment Items Have you had 2 or more falls in the last 12 monthso 0 No Spina, Ryeleigh ([id number]) [phone] [id number] Page 3 of 4 Have you had any fall that resulted in injury in the last 12 monthso 0 No FALLS RISK SCREEN History of falling - immediate or within 3 months 0 No Secondary diagnosis (Do you have 2 or more medical diagnoseso) 0 No Ambulatory aid None/bed rest/wheelchair/nurse 0  No Crutches/cane/walker 0 No Furniture 0 No Intravenous therapy Access/Saline/Heparin Lock 0 No Gait/Transferring Normal/ bed rest/ wheelchair 0 No Weak (short steps with or without shuffle, stooped but able to lift head while walking, may seek 0 No support from furniture) Impaired (short steps with shuffle, may have difficulty arising from chair, head down, impaired 0 No balance) Mental Status Oriented to own ability 0 No Electronic Signature(s) Signed: 11/12/2022 12:09:11 PM By: Rhae Hammock RN Entered By: Rhae Hammock on 11/12/2022 10:10:44 -------------------------------------------------------------------------------- Foot Assessment Details Patient Name: Date of Service: [name], [name] 11/12/2022 9:45 A M Medical Record Number: [id number] Patient Account Number: [id number] Date of Birth/Sex: Treating RN: [birth year] (61 y.o. Female) Rhae Hammock Primary Care Psalm Arman: [name] Other Clinician: Referring Carin Shipp: Treating Eastin Swing/Extender: [name]: 0 Foot Assessment Items Site Locations + = Sensation present, - = Sensation  absent, C = Callus, U = Ulcer R = Redness, W = Warmth, M = Maceration, PU = Pre-ulcerative lesion F = Fissure, S = Swelling, D = Dryness Assessment Right: Left: Other Deformity: No No Prior Foot Ulcer: No No Prior Amputation: No No Charcot Joint: No No Ambulatory Status: GaitGEMA, RINGOLD ([id number]) [phone] [id number] Page 4 of 4 Notes N/A sacaral wound Electronic Signature(s) Signed: 11/12/2022 12:09:11 PM By: Rhae Hammock RN Entered By: Rhae Hammock on 11/12/2022 10:10:58 -------------------------------------------------------------------------------- Nutrition Risk Screening Details Patient Name: Date of Service: [name], [name] 11/12/2022 9:45 A M Medical Record Number: [id number] Patient Account Number: [id number] Date of Birth/Sex: Treating RN: 04-Jan-1961 (61 y.o. Female) Rhae Hammock Primary Care Teyanna Thielman: [name] Other Clinician: Referring Janvi Ammar: Treating Ananias Kolander/Extender: [name]: 0 Height (in): Weight (lbs): Body Mass Index (BMI): Nutrition Risk Screening Items Score Screening NUTRITION RISK SCREEN: I have an illness or condition that made me change the kind and/or amount of food I eat 0 No I eat fewer than two meals per day 0 No I eat few fruits and vegetables, or milk products 0 No I have three or more drinks of beer, liquor or wine almost every day 0 No I have tooth or mouth problems that make it hard for me to eat 0 No I don't always have enough money to buy the food I need 0 No I eat alone most of the time 0 No I take three or more different prescribed or over-the-counter drugs a day 0 No Without wanting to, I have lost or gained 10 pounds in the last six months 0 No I am not always physically able to shop, cook and/or feed myself 0 No Nutrition Protocols Good Risk Protocol 0 No interventions needed Moderate Risk Protocol High Risk Proctocol Risk  Level: Good Risk Score: 0 Electronic Signature(s) Signed: 11/12/2022 12:09:11 PM By: Rhae Hammock RN Entered By: Rhae Hammock on 11/12/2022 10:10:49

## 2022-11-15 NOTE — Progress Notes (Signed)
DARIENNE, BELLEAU ([id number]) [id number] Page 1 of 9 Visit Report for 11/12/2022 Chief Complaint Document Details Patient Name: Date of Service: [name], [name] 11/12/2022 9:45 A M Medical Record Number: [id number] Patient Account Number: [id number] Date of Birth/Sex: Treating RN: Aug 01, 1961 (61 y.o. F) Primary Care Provider: [name] Other Clinician: Referring Provider: Treating Provider/Extender: [name] REF, SELF [name]: 0 Information Obtained from: Patient Chief Complaint Sacral ulcer Electronic Signature(s) Signed: 11/15/2022 11:34:26 AM By: [name] DO Entered By: [name] on 11/12/2022 10:44:41 -------------------------------------------------------------------------------- Debridement Details Patient Name: Date of Service: [name], [name] 11/12/2022 9:45 A M Medical Record Number: [id number] Patient Account Number: [id number] Date of Birth/Sex: Treating RN: 11-11-1961 (61 y.o. [name] Primary Care Provider: [name] Other Clinician: Referring Provider: Treating Provider/Extender: [name] REF, SELF [name]: 0 Debridement Performed for Assessment: Wound #2 Sacrum Performed By: Clinician Rhae Hammock, RN Debridement Type: Chemical/Enzymatic/Mechanical Agent Used: gauze and wound cleanser Level of Consciousness (Pre-procedure): Awake and Alert Pre-procedure Verification/Time Out No Taken: Bleeding: None Response to Treatment: Procedure was tolerated well Level of Consciousness (Post- Awake and Alert procedure): Post Debridement Measurements of Total Wound Length: (cm) 3 Stage: Category/Stage III Width: (cm) 2 Depth: (cm) 1.5 Volume: (cm) 7.069 Character of Wound/Ulcer Post Debridement: Stable Post Procedure Diagnosis Same as Pre-procedure Electronic Signature(s) Signed: 11/12/2022 5:21:52 PM By: [name] RN, BSN Signed: 11/15/2022 11:34:26  AM By: [name] DO Entered By: [name] on 11/12/2022 10:39:40 [name] ([id number]) [id number] Page 2 of 9 -------------------------------------------------------------------------------- HPI Details Patient Name: Date of Service: [name], [name] 11/12/2022 9:45 A M Medical Record Number: [id number] Patient Account Number: [id number] Date of Birth/Sex: Treating RN: 1961-12-24 (61 y.o. F) Primary Care Provider: [name] Other Clinician: Referring Provider: Treating Provider/Extender: [name] REF, SELF [name]: 0 History of Present Illness HPI Description: Admission 6/24 [name] is a 61 year old female with a past medical history of type 1 diabetes, right BKA, and kidney transplant that presents to the clinic for an 65-month history of sacral ulcer. She has been using wet-to-dry dressings up until 5 weeks ago when she was started on a wound VAC. 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 She has completed 6 weeks of treatment and states she has been discharged by infectious disease. She currently denies any issues to the wound area. She reports minimal pain. She denies signs of infection. 7/11; patient presents for 2-week follow-up. She uses a wound VAC that is changed with home health 3 times a week. She denies any signs of infection. She has no complaints or issues today. 8/1; patient presents for follow-up. She continues to use a wound VAC and this is changed with home health. She reports pain to the sacrum that started 1 week ago. She denies systemic signs of infection. 8/15; patient presents for 2-week follow-up. She has been using wet-to-dry dressings daily. She reports improvement in the wound size and appearance. She denies infectious symptoms. 9/12; patient presents for follow-up. She has been using wet-to-dry Dakin's moistened gauze. She reports  following up with infectious disease and is currently taking amoxicillin. 10/6; patient presents for follow-up. She has been using Dakin's wet-to-dry moistened gauze dressings. She is still taking amoxicillin. She canceled her appointment with infectious disease yesterday because she is tired of going to doctors appointments. She has not picked up gentamicin cream from the pharmacy to use on the wound bed. She currently denies signs of  infection. 11/3; patient presents for follow-up. She has been using Dakin's wet-to-dry dressings. She has no issues or complaints today. She denies signs of infection. 12/1; patient presents for follow-up. She continues to use Dakin's wet-to-dry dressings. Patient followed up with Dr. Tommy Medal, ID and amoxicillin was stopped as she had completed her course for sacral osteomyelitis. She reports itching to the periwound. She denies signs of infection. 1/6; patient presents for follow-up. She has been using Dakin's wet-to-dry dressings. She never received the wound VAC. She reports improvement with ketoconazole to the periwound. She has no issues or complaints today. She denies signs of infection. 3/17; patient presents for follow-up. She has been using Dakin's wet-to-dry dressings. She has no issues or complaints today. 3/30; patient presents for follow-up. She has been using collagen with Dakin's wet-to-dry packing. She has no issues or complaints today. She does state that she would like to try and do a muscle flap to help close the wound. 4/18; patient presents for follow-up. She changed her mind on a muscle flap. She would like to try a wound VAC. We went ahead and ordered this and she has been using it for the past week. She has no issues or complaints today. She denies signs of infection. 5/25; patient presents for follow-up. She has been using the wound VAC and home health is coming in Monday Wednesday Friday to change it. She denies signs of  infection. 11/12/2022; Patient was referred to wound care/plastic surgery for evaluation of potential muscle flap to the sacrum. Patient saw Dr. Vernona Rieger on 07/28/2022. Unfortunately patient is not a flap candidate due to her multiple comorbidities. Patient has been using Dakin's wet-to-dry dressings. Electronic Signature(s) Signed: 11/15/2022 11:34:26 AM By: [name] DO Entered By: [name] on 11/12/2022 10:47:11 -------------------------------------------------------------------------------- Physical Exam Details Patient Name: Date of Service: [name], [name] 11/12/2022 9:45 A M Medical Record Number: [id number] Patient Account Number: [id number] Date of Birth/Sex: Treating RN: Mar 26, 1961 (61 y.o. F) Primary Care Provider: [name] Other Clinician: Referring Provider: Treating Provider/Extender: [name] REF, SELF Weeks in TreatmentDorann Lodge ([id number]) [id number] Page 3 of 9 Constitutional respirations regular, non-labored and within target range for patient.Marland Kitchen Psychiatric pleasant and cooperative. Notes Sacral region: open wound with pale granulation tissue present. Undermining circumferentially with increased depth in the center. No signs of surrounding soft tissue infection. Electronic Signature(s) Signed: 11/15/2022 11:34:26 AM By: [name] DO Entered By: [name] on 11/12/2022 10:47:52 -------------------------------------------------------------------------------- Physician Orders Details Patient Name: Date of Service: [name], [name] 11/12/2022 9:45 A M Medical Record Number: [id number] Patient Account Number: [id number] Date of Birth/Sex: Treating RN: [birth year] (61 y.o. [name] Primary Care Provider: [name] Other Clinician: Referring Provider: Treating Provider/Extender: [name] REF, SELF [name]: 0 Verbal / Phone Orders: No Diagnosis  Coding ICD-10 Coding Code Description E10.622 Type 1 diabetes mellitus with other skin ulcer Z89.511 Acquired absence of right leg below knee Z94.0 Kidney transplant status M86.68 Other chronic osteomyelitis, other site Follow-up Appointments ppointment in 2 weeks. - Dr. Heber [place] Return A Anesthetic (In clinic) Topical Lidocaine 4% applied to wound bed Cellular or Tissue Based Products Cellular or Tissue Based Product Type: - will run insurance authorization for a an advance tissue product- theraskin and epicord Bathing/ Shower/ Hygiene May shower with protection but do not get wound dressing(s) wet. Off-Loading Low air-loss mattress (Group 2) - continue to use. Roho cushion for wheelchair - continue using your specialty cushion. Turn and reposition every 2 hours Wound  Treatment Wound #2 - Sacrum Cleanser: Wound Cleanser (DME) (Generic) 1 x Per Day/30 Days Discharge Instructions: Cleanse the wound with wound cleanser prior to applying a clean dressing using gauze sponges, not tissue or cotton balls. Peri-Wound Care: Skin Prep (DME) (Generic) 1 x Per Day/30 Days Discharge Instructions: Use skin prep as directed Prim Dressing: Promogran Prisma Matrix, 4.34 (sq in) (silver collagen) (DME) (Dispense As Written) 1 x Per Day/30 Days ary Discharge Instructions: Moisten collagen with saline or hydrogel Prim Dressing: Dakin's Solution 0.25%, 16 (oz) 1 x Per Day/30 Days ary Discharge Instructions: Moisten gauze with Dakin's solution packing the collagen. Secondary Dressing: ABD Pad, 5x9 1 x Per Day/30 Days RAISHA, BRABENDER ([id number]) [id number] Page 4 of 9 Discharge Instructions: Apply over primary dressing as directed. Secondary Dressing: Woven Gauze Sponge, Non-Sterile 4x4 in (DME) (Generic) 1 x Per Day/30 Days Discharge Instructions: Apply over primary dressing as directed. Secured With: Child psychotherapist, Sterile 2x75 (in/in) (DME) (Generic)  1 x Per Day/30 Days Discharge Instructions: Secure with stretch gauze as directed. Secured With: 66M Medipore H Soft Cloth Surgical T ape, 4 x 10 (in/yd) (DME) (Generic) 1 x Per Day/30 Days Discharge Instructions: Secure with tape as directed. Patient Medications llergies: No Known Allergies A Notifications Medication Indication Start End 11/12/2022 lidocaine DOSE topical 4 % cream - cream topical applied only in wound clinic for debridements. Electronic Signature(s) Signed: 11/15/2022 11:34:26 AM By: [name] DO Entered By: [name] on 11/12/2022 10:48:00 -------------------------------------------------------------------------------- Problem List Details Patient Name: Date of Service: [name], [name] 11/12/2022 9:45 A M Medical Record Number: [id number] Patient Account Number: [id number] Date of Birth/Sex: Treating RN: 07-Feb-1961 (62 y.o. F) Primary Care Provider: [name] Other Clinician: Referring Provider: Treating Provider/Extender: [name] REF, SELF [name]: 0 Active Problems ICD-10 Encounter Code Description Active Date MDM Diagnosis E10.622 Type 1 diabetes mellitus with other skin ulcer 11/12/2022 No Yes Z89.511 Acquired absence of right leg below knee 11/12/2022 No Yes Z94.0 Kidney transplant status 11/12/2022 No Yes M86.68 Other chronic osteomyelitis, other site 11/12/2022 No Yes L89.153 Pressure ulcer of sacral region, stage 3 11/12/2022 No Yes Inactive Problems Resolved Problems Electronic Signature(s) ASJA, FROMMER ([id number]) [id number] Page 5 of 9 Signed: 11/15/2022 11:34:26 AM By: [name] DO Entered By: [name] on 11/12/2022 10:42:34 -------------------------------------------------------------------------------- Progress Note Details Patient Name: Date of Service: [name], [name] 11/12/2022 9:45 A M Medical Record Number: [id number] Patient Account Number:  [id number] Date of Birth/Sex: Treating RN: February 24, 1961 (61 y.o. F) Primary Care Provider: [name] Other Clinician: Referring Provider: Treating Provider/Extender: [name] REF, SELF [name]: 0 Subjective Chief Complaint Information obtained from Patient Sacral ulcer History of Present Illness (HPI) Admission 6/24 Ms. Maggy Wyble is a 61 year old female with a past medical history of type 1 diabetes, right BKA, and kidney transplant that presents to the clinic for an 60-month history of sacral ulcer. She has been using wet-to-dry dressings up until 5 weeks ago when she was started on a wound VAC. 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 She has completed 6 weeks of treatment and states she has been discharged by infectious disease. She currently denies any issues to the wound area. She reports minimal pain. She denies signs of infection. 7/11; patient presents for 2-week follow-up. She uses a wound VAC that is changed with home health 3 times a week. She denies any signs of infection. She has no complaints or issues today. 8/1;  patient presents for follow-up. She continues to use a wound VAC and this is changed with home health. She reports pain to the sacrum that started 1 week ago. She denies systemic signs of infection. 8/15; patient presents for 2-week follow-up. She has been using wet-to-dry dressings daily. She reports improvement in the wound size and appearance. She denies infectious symptoms. 9/12; patient presents for follow-up. She has been using wet-to-dry Dakin's moistened gauze. She reports following up with infectious disease and is currently taking amoxicillin. 10/6; patient presents for follow-up. She has been using Dakin's wet-to-dry moistened gauze dressings. She is still taking amoxicillin. She canceled her appointment with infectious disease yesterday because she is tired of going to doctors  appointments. She has not picked up gentamicin cream from the pharmacy to use on the wound bed. She currently denies signs of infection. 11/3; patient presents for follow-up. She has been using Dakin's wet-to-dry dressings. She has no issues or complaints today. She denies signs of infection. 12/1; patient presents for follow-up. She continues to use Dakin's wet-to-dry dressings. Patient followed up with Dr. Tommy Medal, ID and amoxicillin was stopped as she had completed her course for sacral osteomyelitis. She reports itching to the periwound. She denies signs of infection. 1/6; patient presents for follow-up. She has been using Dakin's wet-to-dry dressings. She never received the wound VAC. She reports improvement with ketoconazole to the periwound. She has no issues or complaints today. She denies signs of infection. 3/17; patient presents for follow-up. She has been using Dakin's wet-to-dry dressings. She has no issues or complaints today. 3/30; patient presents for follow-up. She has been using collagen with Dakin's wet-to-dry packing. She has no issues or complaints today. She does state that she would like to try and do a muscle flap to help close the wound. 4/18; patient presents for follow-up. She changed her mind on a muscle flap. She would like to try a wound VAC. We went ahead and ordered this and she has been using it for the past week. She has no issues or complaints today. She denies signs of infection. 5/25; patient presents for follow-up. She has been using the wound VAC and home health is coming in Monday Wednesday Friday to change it. She denies signs of infection. 11/12/2022; Patient was referred to wound care/plastic surgery for evaluation of potential muscle flap to the sacrum. Patient saw Dr. Vernona Rieger on 07/28/2022. Unfortunately patient is not a flap candidate due to her multiple comorbidities. Patient has been using Dakin's wet-to-dry dressings. Patient History Information obtained  from Patient. Allergies No Known Allergies Family History Cancer - Father, Diabetes - Father, Heart Disease - Maternal Grandparents, Hypertension - Mother, Thyroid Problems - Mother,Siblings, No family history of Hereditary Spherocytosis, Kidney Disease, Lung Disease, Seizures, Stroke, Tuberculosis. Social History Former smoker - from teens to 60's, Marital Status - Married, Alcohol Use - Rarely - beer, Drug Use - No History, Caffeine Use - Daily - coffee. [name], [name] ([id number]) [id number] Page 6 of 9 Medical History Hematologic/Lymphatic Patient has history of Anemia Endocrine Patient has history of Type I Diabetes - s Genitourinary Patient has history of End Stage Renal Disease - off dialysis since transplants Immunological Denies history of Lupus Erythematosus, Raynaudoos, Scleroderma Integumentary (Skin) Denies history of History of Burn Musculoskeletal Patient has history of Osteoarthritis, Osteomyelitis Neurologic Patient has history of Neuropathy Oncologic Patient has history of Received Radiation Denies history of Received Chemotherapy Hospitalization/Surgery History - infected wound (pressure) 2022. - (R) BKA amp Oct 2021. - UTI  12/22-12/27/2022. Objective Constitutional respirations regular, non-labored and within target range for patient.. Vitals Time Taken: 10:07 AM, Temperature: 97.8 F, Pulse: 74 bpm, Respiratory Rate: 17 breaths/min, Blood Pressure: 173/84 mmHg, Capillary Blood Glucose: 122 mg/dl. Psychiatric pleasant and cooperative. General Notes: Sacral region: open wound with pale granulation tissue present. Undermining circumferentially with increased depth in the center. No signs of surrounding soft tissue infection. Integumentary (Hair, Skin) Wound #2 status is Open. Original cause of wound was Pressure Injury. The date acquired was: 10/27/2020. The wound is located on the Sacrum. The wound measures 3cm length x 2cm  width x 1.5cm depth; 4.712cm^2 area and 7.069cm^3 volume. There is Fat Layer (Subcutaneous Tissue) exposed. There is no tunneling noted, however, there is undermining starting at 12:00 and ending at 12:00 with a maximum distance of 2cm. There is a medium amount of serosanguineous drainage noted. The wound margin is distinct with the outline attached to the wound base. There is large (67-100%) red, pink granulation within the wound bed. There is a small (1-33%) amount of necrotic tissue within the wound bed including Adherent Slough. The periwound skin appearance did not exhibit: Callus, Crepitus, Excoriation, Induration, Rash, Scarring, Dry/Scaly, Maceration, Atrophie Blanche, Cyanosis, Ecchymosis, Hemosiderin Staining, Mottled, Pallor, Rubor, Erythema. Periwound temperature was noted as No Abnormality. The periwound has tenderness on palpation. 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 She likely has chronic osteomyelitis. She has been treated for osteomyelitis of this area already with a long course of antibiotics. Currently no bone exposed. She may benefit from a skin substitute. We discussed this and she would like to proceed with an insurance verification for this. In the meantime I recommended collagen with Dakin's wet-to-dry backing. Procedures Wound #2 Pre-procedure diagnosis of Wound #2 is a Pressure Ulcer located on the Sacrum . There was a Chemical/Enzymatic/Mechanical debridement performed by Rhae Hammock, RN.. Other agent used was gauze and wound cleanser. There was no bleeding. The procedure was tolerated well. Post Debridement Measurements: 3cm length x 2cm width x 1.5cm depth; 7.069cm^3 volume. Post debridement Stage noted  as Category/Stage III. Character of Wound/Ulcer Post Debridement is stable. Post procedure Diagnosis Wound #2: Same as Pre-Procedure [name], [name] ([id number]) [id number] Page 7 of 9 Plan Follow-up Appointments: Return Appointment in 2 weeks. - Dr. Heber [place] Anesthetic: (In clinic) Topical Lidocaine 4% applied to wound bed Cellular or Tissue Based Products: Cellular or Tissue Based Product Type: - will run insurance authorization for a an advance tissue product- theraskin and epicord Bathing/ Shower/ Hygiene: May shower with protection but do not get wound dressing(s) wet. Off-Loading: Low air-loss mattress (Group 2) - continue to use. Roho cushion for wheelchair - continue using your specialty cushion. Turn and reposition every 2 hours The following medication(s) was prescribed: lidocaine topical 4 % cream cream topical applied only in wound clinic for debridements. was prescribed at facility WOUND #2: - Sacrum Wound Laterality: Cleanser: Wound Cleanser (DME) (Generic) 1 x Per Day/30 Days Discharge Instructions: Cleanse the wound with wound cleanser prior to applying a clean dressing using gauze sponges, not tissue or cotton balls. Peri-Wound Care: Skin Prep (DME) (Generic) 1 x Per Day/30 Days Discharge Instructions: Use skin prep as directed Prim Dressing: Promogran Prisma Matrix, 4.34 (sq in) (silver collagen) (DME) (Dispense As Written) 1 x Per  Day/30 Days ary Discharge Instructions: Moisten collagen with saline or hydrogel Prim Dressing: Dakin's Solution 0.25%, 16 (oz) 1 x Per Day/30 Days ary Discharge Instructions: Moisten gauze with Dakin's solution packing the collagen. Secondary Dressing: ABD Pad, 5x9 1 x Per Day/30 Days Discharge Instructions: Apply over primary dressing as directed. Secondary Dressing: Woven Gauze Sponge, Non-Sterile 4x4 in (DME) (Generic) 1 x Per Day/30 Days Discharge Instructions: Apply over primary dressing as  directed. Secured With: Child psychotherapist, Sterile 2x75 (in/in) (DME) (Generic) 1 x Per Day/30 Days Discharge Instructions: Secure with stretch gauze as directed. Secured With: 80M Medipore H Soft Cloth Surgical T ape, 4 x 10 (in/yd) (DME) (Generic) 1 x Per Day/30 Days Discharge Instructions: Secure with tape as directed. 1. Run IVR for skin substitute-Epicort and there is skin 2. Collagen with Dakin's wet-to-dry backing 3. Follow-up in 4 weeks 4. Aggressive offloadingooreposition every 2 hours, low-air-loss mattress, Roho cushion Electronic Signature(s) Signed: 11/15/2022 11:34:26 AM By: [name] DO Entered By: [name] on 11/12/2022 10:50:56 -------------------------------------------------------------------------------- HxROS Details Patient Name: Date of Service: [name], [name] 11/12/2022 9:45 A M Medical Record Number: [id number] Patient Account Number: [id number] Date of Birth/Sex: Treating RN: 1961/12/13 (61 y.o. [name] Primary Care Provider: [name] Other Clinician: Referring Provider: Treating Provider/Extender: [name] REF, SELF [name]: 0 Information Obtained From Patient Hematologic/Lymphatic Medical History: Positive for: Anemia Endocrine Medical History: Positive for: Type I Diabetes - s [name], ZEIDERS ([id number]) [id number] Page 8 of 9 Time with diabetes: 93 years Treated with: Insulin Blood sugar tested every day: Yes Tested : constant with device Genitourinary Medical History: Positive for: End Stage Renal Disease - off dialysis since transplants Immunological Medical History: Negative for: Lupus Erythematosus; Raynauds; Scleroderma Integumentary (Skin) Medical History: Negative for: History of Burn Musculoskeletal Medical History: Positive for: Osteoarthritis; Osteomyelitis Neurologic Medical History: Positive for: Neuropathy Oncologic Medical  History: Positive for: Received Radiation Negative for: Received Chemotherapy Immunizations Pneumococcal Vaccine: Received Pneumococcal Vaccination: Yes Received Pneumococcal Vaccination On or After 60th Birthday: No Immunization Notes: states being up to date on all - unknown dates for immuization Implantable Devices None Hospitalization / Surgery History Type of Hospitalization/Surgery infected wound (pressure) 2022 (R) BKA amp Oct 2021 UTI 12/22-12/27/2022 Family and Social History Cancer: Yes - Father; Diabetes: Yes - Father; Heart Disease: Yes - Maternal Grandparents; Hereditary Spherocytosis: No; Hypertension: Yes - Mother; Kidney Disease: No; Lung Disease: No; Seizures: No; Stroke: No; Thyroid Problems: Yes - Mother,Siblings; Tuberculosis: No; Former smoker - from teens to 50's; Marital Status - Married; Alcohol Use: Rarely - beer; Drug Use: No History; Caffeine Use: Daily - coffee; Financial Concerns: No; Food, Clothing or Shelter Needs: No; Support System Lacking: No; Transportation Concerns: No Electronic Signature(s) Signed: 11/12/2022 5:21:52 PM By: [name] RN, BSN Signed: 11/15/2022 11:34:26 AM By: [name] DO Entered By: [name] on 11/11/2022 17:24:42 -------------------------------------------------------------------------------- SuperBill Details Patient Name: Date of Service: YASHIKA, MASK 11/12/2022 Medical Record Number: [id number] Patient Account Number: [id number] Date of Birth/Sex: Treating RN: 1961/04/30 (61 y.o. 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, other site L89.153 Pressure ulcer of sacral region, stage 3 Facility Procedures : CPT4 Code: 17408144 Description: 81856 -  WOUND CARE VISIT-LEV 3 EST PT Modifier: Quantity: 1 : CPT4 Code: 55732202 Description: 54270 - DEBRIDE W/O ANES NON SELECT Modifier: Quantity: 1 Physician Procedures : CPT4 Code Description Modifier [id number] 99213 - WC PHYS LEVEL 3 - EST PT ICD-10 Diagnosis Description L89.153 Pressure ulcer of sacral region, stage 3 E10.622 Type 1 diabetes mellitus with other skin ulcer M86.68 Other chronic osteomyelitis, other site  Z94.0 Kidney transplant status Quantity: 1 Electronic Signature(s) Signed: 11/12/2022 5:21:52 PM By: [name] RN, BSN Signed: 11/15/2022 11:34:26 AM By: [name] DO Entered By: [name] on 11/12/2022 13:28:38

## 2022-11-15 NOTE — Progress Notes (Signed)
Doris, Lopez ([id number]) [id number] Page 1 of 9 Visit Report for 11/12/2022 Allergy List Details Patient Name: Date of Service: [name], [name] 11/12/2022 9:45 A M Medical Record Number: [id number] Patient Account Number: [id number] Date of Birth/Sex: Treating RN: 1961/08/17 (61 y.o. [name] Primary Care Joanna Borawski: [name] Other Clinician: Referring Una Yeomans: Treating Ansar Skoda/Extender: [name] REF, SELF [name]: 0 Allergies Active Allergies No Known Allergies Allergy Notes Electronic Signature(s) Signed: 11/12/2022 12:09:11 PM By: Rhae Hammock RN Entered By: Rhae Hammock on 11/12/2022 10:08:52 -------------------------------------------------------------------------------- Arrival Information Details Patient Name: Date of Service: Doris, Lopez 11/12/2022 9:45 A M Medical Record Number: [id number] Patient Account Number: [id number] Date of Birth/Sex: Treating RN: Apr 27, 1961 (61 y.o. [name], [name] Primary Care Javoni Lucken: [name] Other Clinician: Referring Sereniti Wan: Treating Johna Kearl/Extender: [name] REF, SELF [name]: 0 Visit Information Patient Arrived: Wheel Chair Arrival Time: 10:07 Accompanied By: husband Transfer Assistance: Manual Patient Identification Verified: Yes Secondary Verification Process Completed: Yes Patient Requires Transmission-Based Precautions: No Patient Has Alerts: No History Since Last Visit Added or deleted any medications: No Any new allergies or adverse reactions: No Had a fall or experienced change in activities of daily living that may affect risk of falls: No Signs or symptoms of abuse/neglect since last visito No Hospitalized since last visit: No Implantable device outside of the clinic excluding cellular tissue based products placed in the center since last visit: No Electronic Signature(s) Signed: 11/12/2022 12:09:11  PM By: Rhae Hammock RN Entered By: Rhae Hammock on 11/12/2022 10:07:18 Thressa Sheller ([id number]) [id number] Page 2 of 9 -------------------------------------------------------------------------------- Clinic Level of Care Assessment Details Patient Name: Date of Service: Doris, Lopez 11/12/2022 9:45 A M Medical Record Number: [id number] Patient Account Number: [id number] Date of Birth/Sex: Treating RN: [birth year] (61 y.o. [name] Primary Care Aasha Dina: [name] Other Clinician: Referring Makenzi Bannister: Treating Camika Marsico/Extender: [name] REF, SELF [name]: 0 Clinic Level of Care Assessment Items TOOL 1 Quantity Score X- 1 0 Use when EandM and Procedure is performed on INITIAL visit ASSESSMENTS - Nursing Assessment / Reassessment X- 1 20 General Physical Exam (combine w/ comprehensive assessment (listed just below) when performed on new pt. evals) X- 1 25 Comprehensive Assessment (HX, ROS, Risk Assessments, Wounds Hx, etc.) ASSESSMENTS - Wound and Skin Assessment / Reassessment []  - 0 Dermatologic / Skin Assessment (not related to wound area) ASSESSMENTS - Ostomy and/or Continence Assessment and Care []  - 0 Incontinence Assessment and Management []  - 0 Ostomy Care Assessment and Management (repouching, etc.) PROCESS - Coordination of Care []  - 0 Simple Patient / Family Education for ongoing care X- 1 20 Complex (extensive) Patient / Family Education for ongoing care X- 1 10 Staff obtains Programmer, systems, Records, T Results / Process Orders est []  - 0 Staff telephones HHA, Nursing Homes / Clarify orders / etc []  - 0 Routine Transfer to another Facility (non-emergent condition) []  - 0 Routine Hospital Admission (non-emergent condition) X- 1 15 New Admissions / Biomedical engineer / Ordering NPWT Apligraf, etc. , []  - 0 Emergency Hospital Admission (emergent condition) PROCESS - Special Needs []   - 0 Pediatric / Minor Patient Management []  - 0 Isolation Patient Management []  - 0 Hearing / Language / Visual special needs []  - 0 Assessment of Community assistance (transportation, D/C planning, etc.) []  - 0 Additional assistance / Altered mentation []  - 0 Support Surface(s) Assessment (bed, cushion, seat, etc.) INTERVENTIONS - Miscellaneous []  - 0 External ear exam []  - 0 Patient Transfer (multiple  staff / Harrel Lemon Lift / Similar devices) []  - 0 Simple Staple / Suture removal (25 or less) []  - 0 Complex Staple / Suture removal (26 or more) []  - 0 Hypo/Hyperglycemic Management (do not check if billed separately) []  - 0 Ankle / Brachial Index (ABI) - do not check if billed separately Has the patient been seen at the hospital within the last three years: Yes Total Score: 90 Level Of Care: New/Established - Level 3 Electronic Signature(s) Signed: 11/12/2022 5:21:52 PM By: [name] RN, BSN Entered By: [name] on 11/12/2022 13:28:32 Thressa Sheller ([id number]) [id number] Page 3 of 9 -------------------------------------------------------------------------------- Encounter Discharge Information Details Patient Name: Date of Service: Doris, Lopez 11/12/2022 9:45 A M Medical Record Number: [id number] Patient Account Number: [id number] Date of Birth/Sex: Treating RN: 09/23/1961 (61 y.o. [name] Primary Care Verlin Uher: [name] Other Clinician: Referring Saurabh Hettich: Treating Tyah Acord/Extender: [name] REF, SELF [name]: 0 Encounter Discharge Information Items Post Procedure Vitals Discharge Condition: Stable Temperature (F): 97.8 Ambulatory Status: Wheelchair Pulse (bpm): 74 Discharge Destination: Home Respiratory Rate (breaths/min): 17 Transportation: Private Auto Blood Pressure (mmHg): 173/84 Accompanied By: husband Schedule Follow-up Appointment: Yes Clinical Summary of Care: Electronic  Signature(s) Signed: 11/12/2022 5:21:52 PM By: [name] RN, BSN Entered By: [name] on 11/12/2022 13:29:21 -------------------------------------------------------------------------------- Lower Extremity Assessment Details Patient Name: Date of Service: LILYGRACE, RODICK 11/12/2022 9:45 A M Medical Record Number: [id number] Patient Account Number: [id number] Date of Birth/Sex: Treating RN: [birth year] (61 y.o. [name], [name] Primary Care Nayel Purdy: [name] Other Clinician: Referring Vi Biddinger: Treating Davion Flannery/Extender: [name] REF, SELF [name]: 0 Electronic Signature(s) Signed: 11/12/2022 12:09:11 PM By: Rhae Hammock RN Entered By: Rhae Hammock on 11/12/2022 10:11:04 -------------------------------------------------------------------------------- Multi Wound Chart Details Patient Name: Date of Service: MALVINA, SCHADLER 11/12/2022 9:45 A M Medical Record Number: [id number] Patient Account Number: [id number] Date of Birth/Sex: Treating RN: 05-Aug-1961 (61 y.o. F) Primary Care Sendy Pluta: [name] Other Clinician: Referring Keelyn Fjelstad: Treating Seirra Kos/Extender: [name] REF, SELF [name]: 0 Vital Signs Height(in): Capillary Blood Glucose(mg/dl): 122 Weight(lbs): Pulse(bpm): 74 Body Mass Index(BMI): Blood Pressure(mmHg): 173/84 Temperature(F): 97.8 Respiratory Rate(breaths/min): 17 Gierke, Nikitia ([id number]) [id number] Page 4 of 9 [2:Photos:] [N/A:N/A] Sacrum N/A N/A Wound Location: Pressure Injury N/A N/A Wounding Event: Pressure Ulcer N/A N/A Primary Etiology: Anemia, Type I Diabetes, End Stage N/A N/A Comorbid History: Renal Disease, Osteoarthritis, Osteomyelitis, Neuropathy, Received Radiation 10/27/2020 N/A N/A Date Acquired: 0 N/A N/A Weeks of Treatment: Open N/A N/A Wound Status: No N/A N/A Wound Recurrence: 3x2x1.5 N/A N/A Measurements L x W x D  (cm) 4.712 N/A N/A A (cm) : rea 7.069 N/A N/A Volume (cm) : 12 Starting Position 1 (o'clock): 12 Ending Position 1 (o'clock): 2 Maximum Distance 1 (cm): Yes N/A N/A Undermining: Category/Stage III N/A N/A Classification: Medium N/A N/A Exudate A mount: Serosanguineous N/A N/A Exudate Type: red, brown N/A N/A Exudate Color: Distinct, outline attached N/A N/A Wound Margin: Large (67-100%) N/A N/A Granulation A mount: Red, Pink N/A N/A Granulation Quality: Small (1-33%) N/A N/A Necrotic A mount: Fat Layer (Subcutaneous Tissue): Yes N/A N/A Exposed Structures: Fascia: No Tendon: No Muscle: No Joint: No Bone: No Small (1-33%) N/A N/A Epithelialization: Chemical/Enzymatic/Mechanical N/A N/A Debridement: N/A N/A N/A Instrument: None N/A N/A Bleeding: Debridement Treatment Response: Procedure was tolerated well N/A N/A Post Debridement Measurements L x 3x2x1.5 N/A N/A W x D (cm) 7.069 N/A N/A Post Debridement Volume: (cm) Category/Stage III N/A N/A Post Debridement Stage: Excoriation: No N/A  N/A Periwound Skin Texture: Induration: No Callus: No Crepitus: No Rash: No Scarring: No Maceration: No N/A N/A Periwound Skin Moisture: Dry/Scaly: No Atrophie Blanche: No N/A N/A Periwound Skin Color: Cyanosis: No Ecchymosis: No Erythema: No Hemosiderin Staining: No Mottled: No Pallor: No Rubor: No No Abnormality N/A N/A Temperature: Yes N/A N/A Tenderness on Palpation: Debridement N/A N/A Procedures Performed: Treatment Notes Electronic Signature(s) Signed: 11/15/2022 11:34:26 AM By: [name] DO Entered By: [name] on 11/12/2022 10:42:43 Thressa Sheller ([id number]) [id number] Page 5 of 9 -------------------------------------------------------------------------------- Multi-Disciplinary Care Plan Details Patient Name: Date of Service: DEAVION, STRIDER 11/12/2022 9:45 A M Medical Record Number:  [id number] Patient Account Number: [id number] Date of Birth/Sex: Treating RN: [birth year] (61 y.o. Helene Shoe, Tammi Klippel Primary Care Jalaine Riggenbach: [name] Other Clinician: Referring Brightyn Mozer: Treating Aubreanna Percle/Extender: [name] REF, SELF [name]: 0 Active Inactive Nutrition Nursing Diagnoses: Potential for alteratiion in Nutrition/Potential for imbalanced nutrition Goals: Patient/caregiver agrees to and verbalizes understanding of need to obtain nutritional consultation Date Initiated: 11/12/2022 Target Resolution Date: 12/03/2022 Goal Status: Active Interventions: Provide education on nutrition Treatment Activities: Patient referred to Primary Care Physician for further nutritional evaluation : 11/12/2022 Notes: Pressure Nursing Diagnoses: Potential for impaired tissue integrity related to pressure, friction, moisture, and shear Goals: Patient/caregiver will verbalize risk factors for pressure ulcer development Date Initiated: 11/12/2022 Target Resolution Date: 12/03/2022 Goal Status: Active Patient/caregiver will verbalize understanding of pressure ulcer management Date Initiated: 11/12/2022 Target Resolution Date: 12/03/2022 Goal Status: Active Interventions: Assess: immobility, friction, shearing, incontinence upon admission and as needed Assess potential for pressure ulcer upon admission and as needed Provide education on pressure ulcers Treatment Activities: Patient referred for pressure reduction/relief devices : 11/12/2022 Notes: Electronic Signature(s) Signed: 11/12/2022 5:21:52 PM By: [name] RN, BSN Entered By: [name] on 11/12/2022 13:27:53 -------------------------------------------------------------------------------- Pain Assessment Details Patient Name: Date of Service: MATTISEN, POHLMANN 11/12/2022 9:45 A M Medical Record Number: [id number] Patient Account Number: [id number] Date of Birth/Sex: Treating RN: 03-07-1961 (61  y.o. [name], Marga Hoots, Nunzio Cory ([id number]) [id number] Page 6 of 9 Primary Care Nylani Michetti: [name] Other Clinician: Referring Colbie Danner: Treating Madalyn Legner/Extender: [name] REF, SELF [name]: 0 Active Problems Location of Pain Severity and Description of Pain Patient Has Paino No Site Locations Pain Management and Medication Current Pain Management: Electronic Signature(s) Signed: 11/12/2022 12:09:11 PM By: Rhae Hammock RN Entered By: Rhae Hammock on 11/12/2022 10:11:11 -------------------------------------------------------------------------------- Patient/Caregiver Education Details Patient Name: Date of Service: Wille Glaser 11/17/2023andnbsp9:45 [place] Record Number: [id number] Patient Account Number: [id number] Date of Birth/Gender: Treating RN: 1961/10/16 (61 y.o. [name] Primary Care Physician: [name] Other Clinician: Referring Physician: Treating Physician/Extender: [name] REF, SELF [name]: 0 Education Assessment Education Provided To: Patient Education Topics Provided Nutrition: Handouts: Nutrition Methods: Explain/Verbal Responses: Reinforcements needed Electronic Signature(s) Signed: 11/12/2022 5:21:52 PM By: [name] RN, BSN Entered By: [name] on 11/12/2022 13:28:01 Thressa Sheller ([id number]) [id number] Page 7 of 9 -------------------------------------------------------------------------------- Wound Assessment Details Patient Name: Date of Service: CHRISTANA, ANGELICA 11/12/2022 9:45 A M Medical Record Number: [id number] Patient Account Number: [id number] Date of Birth/Sex: Treating RN: Mar 12, 1961 (61 y.o. [name], [name] Primary Care Mikael Debell: [name] Other Clinician: Referring Vernessa Likes: Treating Daleon Willinger/Extender: [name] REF, SELF [name]: 0 Wound  Status Wound Number: 2 Primary Pressure Ulcer Etiology: Wound Location: Sacrum Wound Open Wounding Event: Pressure Injury Status: Date Acquired: 10/27/2020 Comorbid Anemia, Type I Diabetes, End Stage Renal Disease, Osteoarthritis, Weeks Of Treatment: 0 History:  Osteomyelitis, Neuropathy, Received Radiation Clustered Wound: No Photos Wound Measurements Length: (cm) 3 Width: (cm) 2 Depth: (cm) 1.5 Area: (cm) 4.712 Volume: (cm) 7.069 % Reduction in Area: % Reduction in Volume: Epithelialization: Small (1-33%) Tunneling: No Undermining: Yes Starting Position (o'clock): 12 Ending Position (o'clock): 12 Maximum Distance: (cm) 2 Wound Description Classification: Category/Stage III Wound Margin: Distinct, outline attached Exudate Amount: Medium Exudate Type: Serosanguineous Exudate Color: red, brown Foul Odor After Cleansing: No Slough/Fibrino Yes Wound Bed Granulation Amount: Large (67-100%) Exposed Structure Granulation Quality: Red, Pink Fascia Exposed: No Necrotic Amount: Small (1-33%) Fat Layer (Subcutaneous Tissue) Exposed: Yes Necrotic Quality: Adherent Slough Tendon Exposed: No Muscle Exposed: No Joint Exposed: No Bone Exposed: No Periwound Skin Texture Texture Color No Abnormalities Noted: No No Abnormalities Noted: No Callus: No Atrophie Blanche: No Crepitus: No Cyanosis: No Excoriation: No Ecchymosis: No Induration: No Erythema: No Rash: No Hemosiderin Staining: No Scarring: No Mottled: No Pallor: No Moisture Rubor: No No Abnormalities Noted: No Dry / Scaly: No Temperature / Pain Shadowens, Shameria ([id number]) [id number] Page 8 of 9 Maceration: No Temperature: No Abnormality Tenderness on Palpation: Yes Treatment Notes Wound #2 (Sacrum) Cleanser Wound Cleanser Discharge Instruction: Cleanse the wound with wound cleanser prior to applying a clean dressing using gauze sponges, not tissue or cotton balls. Peri-Wound  Care Skin Prep Discharge Instruction: Use skin prep as directed Topical Primary Dressing Promogran Prisma Matrix, 4.34 (sq in) (silver collagen) Discharge Instruction: Moisten collagen with saline or hydrogel Dakin's Solution 0.25%, 16 (oz) Discharge Instruction: Moisten gauze with Dakin's solution packing the collagen. Secondary Dressing ABD Pad, 5x9 Discharge Instruction: Apply over primary dressing as directed. Woven Gauze Sponge, Non-Sterile 4x4 in Discharge Instruction: Apply over primary dressing as directed. Secured With Conforming Stretch Gauze Bandage, Sterile 2x75 (in/in) Discharge Instruction: Secure with stretch gauze as directed. 22M Medipore H Soft Cloth Surgical T ape, 4 x 10 (in/yd) Discharge Instruction: Secure with tape as directed. Compression Wrap Compression Stockings Add-Ons Electronic Signature(s) Signed: 11/12/2022 12:09:11 PM By: Rhae Hammock RN Signed: 11/12/2022 12:40:42 PM By: Erenest Blank Entered By: Erenest Blank on 11/12/2022 10:17:01 -------------------------------------------------------------------------------- Vitals Details Patient Name: Date of Service: EMBERLEY, KRAL 11/12/2022 9:45 A M Medical Record Number: [id number] Patient Account Number: [id number] Date of Birth/Sex: Treating RN: 08-06-1961 (61 y.o. [name], [name] Primary Care Uziah Sorter: [name] Other Clinician: Referring Shakima Nisley: Treating Aoki Wedemeyer/Extender: [name] REF, SELF [name]: 0 Vital Signs Time Taken: 10:07 Temperature (F): 97.8 Pulse (bpm): 74 Respiratory Rate (breaths/min): 17 Blood Pressure (mmHg): 173/84 Capillary Blood Glucose (mg/dl): 122 Reference Range: 80 - 120 mg / dl Electronic Signature(s) Signed: 11/12/2022 12:09:11 PM By: Rhae Hammock RN [place], Signed: 11/12/2022 12:09:11 PM By: Rhae Hammock RN Nunzio Cory ([id number]) [id number] Page 9 of 9 Entered By: Rhae Hammock  on 11/12/2022 10:11:24

## 2022-12-09 ENCOUNTER — Ambulatory Visit (HOSPITAL_BASED_OUTPATIENT_CLINIC_OR_DEPARTMENT_OTHER): Payer: BC Managed Care – PPO | Admitting: Internal Medicine

## 2023-01-06 ENCOUNTER — Encounter (HOSPITAL_BASED_OUTPATIENT_CLINIC_OR_DEPARTMENT_OTHER): Payer: BC Managed Care – PPO | Attending: Internal Medicine | Admitting: Internal Medicine

## 2023-01-06 DIAGNOSIS — E10622 Type 1 diabetes mellitus with other skin ulcer: Secondary | ICD-10-CM

## 2023-01-06 DIAGNOSIS — M8668 Other chronic osteomyelitis, other site: Secondary | ICD-10-CM

## 2023-01-06 DIAGNOSIS — Z89511 Acquired absence of right leg below knee: Secondary | ICD-10-CM

## 2023-01-06 DIAGNOSIS — L89153 Pressure ulcer of sacral region, stage 3: Secondary | ICD-10-CM

## 2023-02-14 NOTE — Progress Notes (Signed)
KLOEI, SCHWANDER ([id number]) [id number] Page 1 of 6 Visit Report for 01/06/2023 Arrival Information Details Patient Name: Date of Service: [name], ODOMS 01/06/2023 3:15 PM Medical Record Number: [id number] Patient Account Number: [id number] Date of Birth/Sex: Treating RN: July 20, 1961 (62 y.o. [name], [name] Primary Care Juanantonio Stolar: [name] Other Clinician: Referring Cing : Treating Anina Schnake/Extender: [name] in Treatment: 7 Visit Information History Since Last Visit Added or deleted any medications: No Patient Arrived: Wheel Chair Any new allergies or adverse reactions: No Arrival Time: 15:25 Had a fall or experienced change in No Accompanied By: husband activities of daily living that may affect Transfer Assistance: Manual risk of falls: Patient Identification Verified: Yes Signs or symptoms of abuse/neglect since last visito No Secondary Verification Process Completed: Yes Hospitalized since last visit: No Patient Requires Transmission-Based Precautions: No Implantable device outside of the clinic excluding No Patient Has Alerts: No cellular tissue based products placed in the center since last visit: Has Dressing in Place as Prescribed: Yes Pain Present Now: No Electronic Signature(s) Signed: 02/14/2023 10:40:58 AM By: Rhae Hammock RN Entered By: Rhae Hammock on 01/06/2023 15:26:00 -------------------------------------------------------------------------------- Lower Extremity Assessment Details Patient Name: Date of Service: [name], [name] 01/06/2023 3:15 PM Medical Record Number: [id number] Patient Account Number: [id number] Date of Birth/Sex: Treating RN: September 04, 1961 (62 y.o. [name], [name] Primary Care Renard Caperton: [name] Other Clinician: Referring Crosby Bevan: Treating Brynlei Klausner/Extender: [name]: 7 Electronic  Signature(s) Signed: 02/14/2023 10:40:58 AM By: Rhae Hammock RN Entered By: Rhae Hammock on 01/06/2023 15:26:42 -------------------------------------------------------------------------------- Multi Wound Chart Details Patient Name: Date of Service: [name], [name] 01/06/2023 3:15 PM Medical Record Number: [id number] Patient Account Number: [id number] Date of Birth/Sex: Treating RN: [birth year] (62 y.o. F) Primary Care [name]: [name] Other Clinician: Referring Breken Nazari: Treating Verdun Rackley/Extender: [name] in Treatment: 7 [place], [name] ([id number]) [id number] Page 2 of 6 Vital Signs Height(in): Capillary Blood Glucose(mg/dl): 93 Weight(lbs): Pulse(bpm): 88 Body Mass Index(BMI): Blood Pressure(mmHg): 109/72 Temperature(F): 97.6 Respiratory Rate(breaths/min): 17 [2:Photos:] [N/A:N/A] Sacrum N/A N/A Wound Location: Pressure Injury N/A N/A Wounding Event: Pressure Ulcer N/A N/A Primary Etiology: Anemia, Type I Diabetes, End Stage N/A N/A Comorbid History: Renal Disease, Osteoarthritis, Osteomyelitis, Neuropathy, Received Radiation 10/27/2020 N/A N/A Date Acquired: 7 N/A N/A Weeks of Treatment: Open N/A N/A Wound Status: No N/A N/A Wound Recurrence: 2.5x1x0.5 N/A N/A Measurements L x W x D (cm) 1.963 N/A N/A A (cm) : rea 0.982 N/A N/A Volume (cm) : 58.30% N/A N/A % Reduction in A rea: 86.10% N/A N/A % Reduction in Volume: 3 Starting Position 1 (o'clock): 12 Ending Position 1 (o'clock): 0.7 Maximum Distance 1 (cm): Yes N/A N/A Undermining: Category/Stage III N/A N/A Classification: Medium N/A N/A Exudate A mount: Serosanguineous N/A N/A Exudate Type: red, brown N/A N/A Exudate Color: Distinct, outline attached N/A N/A Wound Margin: Large (67-100%) N/A N/A Granulation A mount: Red, Pink N/A N/A Granulation Quality: Small (1-33%) N/A N/A Necrotic A mount: Fat  Layer (Subcutaneous Tissue): Yes N/A N/A Exposed Structures: Fascia: No Tendon: No Muscle: No Joint: No Bone: No Small (1-33%) N/A N/A Epithelialization: Excoriation: No N/A N/A Periwound Skin Texture: Induration: No Callus: No Crepitus: No Rash: No Scarring: No Maceration: No N/A N/A Periwound Skin Moisture: Dry/Scaly: No Atrophie Blanche: No N/A N/A Periwound Skin Color: Cyanosis: No Ecchymosis: No Erythema: No Hemosiderin Staining: No Mottled: No Pallor: No Rubor: No No Abnormality N/A N/A Temperature: Yes N/A N/A Tenderness on Palpation: Treatment Notes Electronic Signature(s) Signed: 01/06/2023  3:43:16 PM By: [name] DO Entered By: [name] on 01/06/2023 15:37:51 Thressa Sheller ([id number]) [id number] Page 3 of 6 -------------------------------------------------------------------------------- Multi-Disciplinary Care Plan Details Patient Name: Date of Service: [name], [name] 01/06/2023 3:15 PM Medical Record Number: [id number] Patient Account Number: [id number] Date of Birth/Sex: Treating RN: [birth year] (62 y.o. [name], [name] Primary Care Jayshun Galentine: [name] Other Clinician: Referring Hamdi Kley: Treating Tjay Velazquez/Extender: [name] in Treatment: 7 Active Inactive Nutrition Nursing Diagnoses: Potential for alteratiion in Nutrition/Potential for imbalanced nutrition Goals: Patient/caregiver agrees to and verbalizes understanding of need to obtain nutritional consultation Date Initiated: 11/12/2022 Target Resolution Date: 01/29/2023 Goal Status: Active Interventions: Provide education on nutrition Treatment Activities: Education provided on Nutrition : 11/12/2022 Patient referred to Primary Care Physician for further nutritional evaluation : 11/12/2022 Notes: Pressure Nursing Diagnoses: Potential for impaired tissue integrity related to pressure, friction,  moisture, and shear Goals: Patient/caregiver will verbalize risk factors for pressure ulcer development Date Initiated: 11/12/2022 Target Resolution Date: 01/22/2023 Goal Status: Active Patient/caregiver will verbalize understanding of pressure ulcer management Date Initiated: 11/12/2022 Target Resolution Date: 01/29/2023 Goal Status: Active Interventions: Assess: immobility, friction, shearing, incontinence upon admission and as needed Assess potential for pressure ulcer upon admission and as needed Provide education on pressure ulcers Treatment Activities: Patient referred for pressure reduction/relief devices : 11/12/2022 Notes: Electronic Signature(s) Signed: 02/14/2023 10:40:58 AM By: Rhae Hammock RN Entered By: Rhae Hammock on 01/06/2023 15:35:06 -------------------------------------------------------------------------------- Pain Assessment Details Patient Name: Date of Service: DERRICA, ALIOTO 01/06/2023 3:15 PM Thressa Sheller ([id number]) [id number] Page 4 of 6 Medical Record Number: [id number] Patient Account Number: [id number] Date of Birth/Sex: Treating RN: 1960/12/29 (62 y.o. [name], [name] Primary Care Tamia Dial: [name] Other Clinician: Referring Alyne Martinson: Treating Shareese Macha/Extender: [name]: 7 Active Problems Location of Pain Severity and Description of Pain Patient Has Paino No Site Locations Pain Management and Medication Current Pain Management: Electronic Signature(s) Signed: 02/14/2023 10:40:58 AM By: Rhae Hammock RN Entered By: Rhae Hammock on 01/06/2023 15:26:33 -------------------------------------------------------------------------------- Patient/Caregiver Education Details Patient Name: Date of Service: RILEY, LEAKEY 1/11/2024andnbsp3:15 PM Medical Record Number: [id number] Patient Account Number: [id number] Date of Birth/Gender: Treating  RN: 08-24-1961 (62 y.o. [name] Primary Care Physician: [name] Other Clinician: Referring Physician: Treating Physician/Extender: [name] in Treatment: 7 Education Assessment Education Provided To: Patient Education Topics Provided Wound/Skin Impairment: Methods: Explain/Verbal Responses: Reinforcements needed, State content correctly Electronic Signature(s) Signed: 02/14/2023 10:40:58 AM By: Rhae Hammock RN Entered By: Rhae Hammock on 01/06/2023 15:35:21 Thressa Sheller ([id number]) [id number] Page 5 of 6 -------------------------------------------------------------------------------- Wound Assessment Details Patient Name: Date of Service: [name], BONUS 01/06/2023 3:15 PM Medical Record Number: [id number] Patient Account Number: [id number] Date of Birth/Sex: Treating RN: [birth year] (62 y.o. [name], [name] Primary Care Hafsah Hendler: [name] Other Clinician: Referring Kenedee Molesky: Treating Venissa Nappi/Extender: [name]: 7 Wound Status Wound Number: 2 Primary Pressure Ulcer Etiology: Wound Location: Sacrum Wound Open Wounding Event: Pressure Injury Status: Date Acquired: 10/27/2020 Comorbid Anemia, Type I Diabetes, End Stage Renal Disease, Osteoarthritis, Weeks Of Treatment: 7 History: Osteomyelitis, Neuropathy, Received Radiation Clustered Wound: No Photos Wound Measurements Length: (cm) 2.5 Width: (cm) 1 Depth: (cm) 0.5 Area: (cm) 1.963 Volume: (cm) 0.982 % Reduction in Area: 58.3% % Reduction in Volume: 86.1% Epithelialization: Small (1-33%) Tunneling: No Undermining: Yes Starting Position (o'clock): 3 Ending Position (o'clock): 12 Maximum Distance: (cm) 0.7 Wound Description Classification: Category/Stage III Wound Margin: Distinct, outline attached Exudate Amount: Medium  Exudate Type: Serosanguineous Exudate  Color: red, brown Foul Odor After Cleansing: No Slough/Fibrino Yes Wound Bed Granulation Amount: Large (67-100%) Exposed Structure Granulation Quality: Red, Pink Fascia Exposed: No Necrotic Amount: Small (1-33%) Fat Layer (Subcutaneous Tissue) Exposed: Yes Necrotic Quality: Adherent Slough Tendon Exposed: No Muscle Exposed: No Joint Exposed: No Bone Exposed: No Periwound Skin Texture Texture Color No Abnormalities Noted: No No Abnormalities Noted: No Callus: No Atrophie Blanche: No Crepitus: No Cyanosis: No Excoriation: No Ecchymosis: No Induration: No Erythema: No Rash: No Hemosiderin Staining: No Scarring: No Mottled: No Pallor: No Moisture Fiser, [name] ([id number]) [phone] Page 6 of 6 Rubor: No No Abnormalities Noted: No Dry / Scaly: No Temperature / Pain Maceration: No Temperature: No Abnormality Tenderness on Palpation: Yes Electronic Signature(s) Signed: 02/14/2023 10:40:58 AM By: Rhae Hammock RN Entered By: Rhae Hammock on 01/06/2023 15:31:47 -------------------------------------------------------------------------------- Vitals Details Patient Name: Date of Service: HAJIRA, [name] 01/06/2023 3:15 PM Medical Record Number: [id number] Patient Account Number: [id number] Date of Birth/Sex: Treating RN: [birth year] (62 y.o. [name], [name] Primary Care Jaymere Alen: [name] Other Clinician: Referring Zhane Donlan: Treating Dillin Lofgren/Extender: [name]: 7 Vital Signs Time Taken: 15:26 Temperature (F): 97.6 Pulse (bpm): 88 Respiratory Rate (breaths/min): 17 Blood Pressure (mmHg): 109/72 Capillary Blood Glucose (mg/dl): 93 Reference Range: 80 - 120 mg / dl Electronic Signature(s) Signed: 02/14/2023 10:40:58 AM By: Rhae Hammock RN Entered By: Rhae Hammock on 01/06/2023 [id number]

## 2023-02-14 NOTE — Progress Notes (Signed)
[name], [name] ([id number]) [id number] Page 1 of 8 Visit Report for 01/06/2023 Chief Complaint Document Details Patient Name: Date of Service: [name], [name] 01/06/2023 3:15 PM Medical Record Number: [id number] Patient Account Number: [id number] Date of Birth/Sex: Treating RN: August 24, 1961 (62 y.o. F) Primary Care Provider: Melissa Lopez Other Clinician: Referring Provider: Treating Provider/Extender: [name] in Treatment: 7 Information Obtained from: Patient Chief Complaint Sacral ulcer Electronic Signature(s) Signed: 01/06/2023 3:43:16 PM By: [name] DO Entered By: [name] on 01/06/2023 15:38:35 -------------------------------------------------------------------------------- HPI Details Patient Name: Date of Service: [name], [name] 01/06/2023 3:15 PM Medical Record Number: [id number] Patient Account Number: [id number] Date of Birth/Sex: Treating RN: Oct 08, 1961 (62 y.o. F) Primary Care Provider: Melissa Lopez Other Clinician: Referring Provider: Treating Provider/Extender: [name] in Treatment: 7 History of Present Illness HPI Description: Admission 6/24 [name] is a 62 year old female with a past medical history of type 1 diabetes, right BKA, and kidney transplant that presents to the clinic for an 93-monthhistory of sacral ulcer. She has been using wet-to-dry dressings up until 5 weeks ago when she was started on a wound VAC. 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 She has completed 6 weeks of treatment and states she has been discharged by infectious disease. She currently denies any issues to the wound area. She reports minimal pain. She denies signs of infection. 7/11; patient presents for 2-week follow-up. She uses a wound VAC that is changed with home health 3 times a week. She denies any signs  of infection. She has no complaints or issues today. 8/1; patient presents for follow-up. She continues to use a wound VAC and this is changed with home health. She reports pain to the sacrum that started 1 week ago. She denies systemic signs of infection. 8/15; patient presents for 2-week follow-up. She has been using wet-to-dry dressings daily. She reports improvement in the wound size and appearance. She denies infectious symptoms. 9/12; patient presents for follow-up. She has been using wet-to-dry Dakin's moistened gauze. She reports following up with infectious disease and is currently taking amoxicillin. 10/6; patient presents for follow-up. She has been using Dakin's wet-to-dry moistened gauze dressings. She is still taking amoxicillin. She canceled her appointment with infectious disease yesterday because she is tired of going to doctors appointments. She has not picked up gentamicin cream from the pharmacy to use on the wound bed. She currently denies signs of infection. 11/3; patient presents for follow-up. She has been using Dakin's wet-to-dry dressings. She has no issues or complaints today. She denies signs of infection. 12/1; patient presents for follow-up. She continues to use Dakin's wet-to-dry dressings. Patient followed up with Dr. VTommy Medal ID and amoxicillin was stopped as she had completed her course for sacral osteomyelitis. She reports itching to the periwound. She denies signs of infection. 1/6; patient presents for follow-up. She has been using Dakin's wet-to-dry dressings. She never received the wound VAC. She reports improvement with ketoconazole to the periwound. She has no issues or complaints today. She denies signs of infection. 3/17; patient presents for follow-up. She has been using Dakin's wet-to-dry dressings. She has no issues or complaints today. MMENAAL, NEMEROFF([id number] Page 2 of 8 3/30; patient presents for follow-up.  She has been using collagen with Dakin's wet-to-dry packing. She has no issues or complaints today. She does state that she would like to try and do a muscle flap to help close the  wound. 4/18; patient presents for follow-up. She changed her mind on a muscle flap. She would like to try a wound VAC. We went ahead and ordered this and she has been using it for the past week. She has no issues or complaints today. She denies signs of infection. 5/25; patient presents for follow-up. She has been using the wound VAC and home health is coming in Monday Wednesday Friday to change it. She denies signs of infection. 11/12/2022; Patient was referred to wound care/plastic surgery for evaluation of potential muscle flap to the sacrum. Patient saw Dr. Vernona Rieger on 07/28/2022. Unfortunately patient is not a flap candidate due to her multiple comorbidities. Patient has been using Dakin's wet-to-dry dressings. 1/11; patient presents for follow-up. She has been using collagen with Dakin's wet-to-dry packing. She has no issues or complaints today. Electronic Signature(s) Signed: 01/06/2023 3:43:16 PM By: [name] DO Entered By: [name] on 01/06/2023 15:39:09 -------------------------------------------------------------------------------- Physical Exam Details Patient Name: Date of Service: [name], [name] 01/06/2023 3:15 PM Medical Record Number: [id number] Patient Account Number: [id number] Date of Birth/Sex: Treating RN: 04/06/1961 (62 y.o. F) Primary Care Provider: Melissa Lopez Other Clinician: Referring Provider: Treating Provider/Extender: [name]: 7 Constitutional respirations regular, non-labored and within target range for patient.Marland Kitchen Psychiatric pleasant and cooperative. Notes Sacral region: open wound with pale granulation tissue present. Undermining circumferentially with increased depth in the center. No signs of surrounding  soft tissue infection. Electronic Signature(s) Signed: 01/06/2023 3:43:16 PM By: [name] DO Entered By: [name] on 01/06/2023 15:39:37 -------------------------------------------------------------------------------- Physician Orders Details Patient Name: Date of Service: [name], [name] 01/06/2023 3:15 PM Medical Record Number: [id number] Patient Account Number: [id number] Date of Birth/Sex: Treating RN: [birth year] (62 y.o. [name], [name] Primary Care Provider: Melissa Lopez Other Clinician: Referring Provider: Treating Provider/Extender: [name] in Treatment: 7 Verbal / Phone Orders: No Diagnosis Coding Follow-up Appointments Other: - 2 months w/ Dr. Heber [place] Anesthetic (In clinic) Topical Lidocaine 4% applied to wound bed CALLAHAN, CROSSETT ([id number]) [id number] Page 3 of 8 Cellular or Tissue Based Products Cellular or Tissue Based Product Type: - 01/06/23 - will run insurance authorization for a an advance tissue product- theraskin Off-Loading Low air-loss mattress (Group 2) - continue to use. Roho cushion for wheelchair - continue using your specialty cushion. Turn and reposition every 2 hours Wound Treatment Wound #2 - Sacrum Cleanser: Wound Cleanser (Generic) 1 x Per Day/30 Days Discharge Instructions: Cleanse the wound with wound cleanser prior to applying a clean dressing using gauze sponges, not tissue or cotton balls. Peri-Wound Care: Skin Prep (Generic) 1 x Per Day/30 Days Discharge Instructions: Use skin prep as directed Prim Dressing: Dakin's Solution 0.25%, 16 (oz) 1 x Per Day/30 Days ary Discharge Instructions: Moisten gauze with Dakin's solution packing the collagen. Prim Dressing: Promogran Prisma Matrix, 4.34 (sq in) (silver collagen) 1 x Per Day/30 Days ary Discharge Instructions: Moisten collagen with saline or hydrogel Secondary Dressing: ABD Pad, 5x9 1 x Per Day/30  Days Discharge Instructions: Apply over primary dressing as directed. Secondary Dressing: Woven Gauze Sponge, Non-Sterile 4x4 in (Generic) 1 x Per Day/30 Days Discharge Instructions: Apply over primary dressing as directed. Secured With: Child psychotherapist, Sterile 2x75 (in/in) (Generic) 1 x Per Day/30 Days Discharge Instructions: Secure with stretch gauze as directed. Secured With: 87M Medipore H Soft Cloth Surgical T ape, 4 x 10 (in/yd) (Generic) 1 x Per Day/30 Days Discharge Instructions: Secure with tape as directed. Electronic Signature(s) Signed:  01/06/2023 3:43:16 PM By: [name] DO Signed: 02/14/2023 10:40:58 AM By: Rhae Hammock RN Entered By: Rhae Hammock on 01/06/2023 15:42:47 -------------------------------------------------------------------------------- Problem List Details Patient Name: Date of Service: [name], [name] 01/06/2023 3:15 PM Medical Record Number: [id number] Patient Account Number: [id number] Date of Birth/Sex: Treating RN: June 29, 1961 (62 y.o. F) Primary Care Provider: Melissa Lopez Other Clinician: Referring Provider: Treating Provider/Extender: [name]: 7 Active Problems ICD-10 Encounter Code Description Active Date MDM Diagnosis E10.622 Type 1 diabetes mellitus with other skin ulcer 11/12/2022 No Yes Z89.511 Acquired absence of right leg below knee 11/12/2022 No Yes Z94.0 Kidney transplant status 11/12/2022 No Yes M86.68 Other chronic osteomyelitis, other site 11/12/2022 No Yes [name], [name] ([id number]) [id number] Page 4 of 8 L89.153 Pressure ulcer of sacral region, stage 3 11/12/2022 No Yes Inactive Problems Resolved Problems Electronic Signature(s) Signed: 01/06/2023 3:43:16 PM By: [name] DO Entered By: [name] on 01/06/2023  15:37:38 -------------------------------------------------------------------------------- Progress Note Details Patient Name: Date of Service: [name], [name] 01/06/2023 3:15 PM Medical Record Number: [id number] Patient Account Number: [id number] Date of Birth/Sex: Treating RN: 1961/07/29 (62 y.o. F) Primary Care Provider: Melissa Lopez Other Clinician: Referring Provider: Treating Provider/Extender: [name] in Treatment: 7 Subjective Chief Complaint Information obtained from Patient Sacral ulcer History of Present Illness (HPI) Admission 6/24 Ms. Liliana Schwinghammer is a 62 year old female with a past medical history of type 1 diabetes, right BKA, and kidney transplant that presents to the clinic for an 29-monthhistory of sacral ulcer. She has been using wet-to-dry dressings up until 5 weeks ago when she was started on a wound VAC. 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 She has completed 6 weeks of treatment and states she has been discharged by infectious disease. She currently denies any issues to the wound area. She reports minimal pain. She denies signs of infection. 7/11; patient presents for 2-week follow-up. She uses a wound VAC that is changed with home health 3 times a week. She denies any signs of infection. She has no complaints or issues today. 8/1; patient presents for follow-up. She continues to use a wound VAC and this is changed with home health. She reports pain to the sacrum that started 1 week ago. She denies systemic signs of infection. 8/15; patient presents for 2-week follow-up. She has been using wet-to-dry dressings daily. She reports improvement in the wound size and appearance. She denies infectious symptoms. 9/12; patient presents for follow-up. She has been using wet-to-dry Dakin's moistened gauze. She reports following up with infectious disease and is currently taking  amoxicillin. 10/6; patient presents for follow-up. She has been using Dakin's wet-to-dry moistened gauze dressings. She is still taking amoxicillin. She canceled her appointment with infectious disease yesterday because she is tired of going to doctors appointments. She has not picked up gentamicin cream from the pharmacy to use on the wound bed. She currently denies signs of infection. 11/3; patient presents for follow-up. She has been using Dakin's wet-to-dry dressings. She has no issues or complaints today. She denies signs of infection. 12/1; patient presents for follow-up. She continues to use Dakin's wet-to-dry dressings. Patient followed up with Dr. VTommy Medal ID and amoxicillin was stopped as she had completed her course for sacral osteomyelitis. She reports itching to the periwound. She denies signs of infection. 1/6; patient presents for follow-up. She has been using Dakin's wet-to-dry dressings. She never received the wound VAC. She reports improvement  with ketoconazole to the periwound. She has no issues or complaints today. She denies signs of infection. 3/17; patient presents for follow-up. She has been using Dakin's wet-to-dry dressings. She has no issues or complaints today. 3/30; patient presents for follow-up. She has been using collagen with Dakin's wet-to-dry packing. She has no issues or complaints today. She does state that she would like to try and do a muscle flap to help close the wound. 4/18; patient presents for follow-up. She changed her mind on a muscle flap. She would like to try a wound VAC. We went ahead and ordered this and she has been using it for the past week. She has no issues or complaints today. She denies signs of infection. 5/25; patient presents for follow-up. She has been using the wound VAC and home health is coming in Monday Wednesday Friday to change it. She denies signs of infection. 11/12/2022; Patient was referred to wound care/plastic surgery for  evaluation of potential muscle flap to the sacrum. Patient saw Dr. Vernona Rieger on 07/28/2022. [name], [name] ([id number]) [id number] Page 5 of 8 Unfortunately patient is not a flap candidate due to her multiple comorbidities. Patient has been using Dakin's wet-to-dry dressings. 1/11; patient presents for follow-up. She has been using collagen with Dakin's wet-to-dry packing. She has no issues or complaints today. Patient History Information obtained from Patient. Family History Cancer - Father, Diabetes - Father, Heart Disease - Maternal Grandparents, Hypertension - Mother, Thyroid Problems - Mother,Siblings, No family history of Hereditary Spherocytosis, Kidney Disease, Lung Disease, Seizures, Stroke, Tuberculosis. Social History Former smoker - from teens to 50's, Marital Status - Married, Alcohol Use - Rarely - beer, Drug Use - No History, Caffeine Use - Daily - coffee. Medical History Hematologic/Lymphatic Patient has history of Anemia Endocrine Patient has history of Type I Diabetes - s Genitourinary Patient has history of End Stage Renal Disease - off dialysis since transplants Immunological Denies history of Lupus Erythematosus, Raynaudoos, Scleroderma Integumentary (Skin) Denies history of History of Burn Musculoskeletal Patient has history of Osteoarthritis, Osteomyelitis Neurologic Patient has history of Neuropathy Oncologic Patient has history of Received Radiation Denies history of Received Chemotherapy Hospitalization/Surgery History - infected wound (pressure) 2022. - (R) BKA amp Oct 2021. - UTI 12/22-12/27/2022. Objective Constitutional respirations regular, non-labored and within target range for patient.. Vitals Time Taken: 3:26 PM, Temperature: 97.6 F, Pulse: 88 bpm, Respiratory Rate: 17 breaths/min, Blood Pressure: 109/72 mmHg, Capillary Blood Glucose: 93 mg/dl. Psychiatric pleasant and cooperative. General Notes: Sacral region: open  wound with pale granulation tissue present. Undermining circumferentially with increased depth in the center. No signs of surrounding soft tissue infection. Integumentary (Hair, Skin) Wound #2 status is Open. Original cause of wound was Pressure Injury. The date acquired was: 10/27/2020. The wound has been in treatment 7 weeks. The wound is located on the Sacrum. The wound measures 2.5cm length x 1cm width x 0.5cm depth; 1.963cm^2 area and 0.982cm^3 volume. There is Fat Layer (Subcutaneous Tissue) exposed. There is no tunneling noted, however, there is undermining starting at 3:00 and ending at 12:00 with a maximum distance of 0.7cm. There is a medium amount of serosanguineous drainage noted. The wound margin is distinct with the outline attached to the wound base. There is large (67-100%) red, pink granulation within the wound bed. There is a small (1-33%) amount of necrotic tissue within the wound bed including Adherent Slough. The periwound skin appearance did not exhibit: Callus, Crepitus, Excoriation, Induration, Rash, Scarring, Dry/Scaly, Maceration, Atrophie Blanche, Cyanosis, Ecchymosis, Hemosiderin Staining,  Mottled, Pallor, Rubor, Erythema. Periwound temperature was noted as No Abnormality. The periwound has tenderness on palpation. Assessment Active Problems ICD-10 Type 1 diabetes mellitus with other skin ulcer Acquired absence of right leg below knee Kidney transplant status Other chronic osteomyelitis, other site Pressure ulcer of sacral region, stage 3 [place], Nunzio Cory ([id number]) [id number] Page 6 of 8 Patient's wound is stable. 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 Not a candidate for muscle flap. I recommended continuing with collagen and Dakin's wet-to-dry backing. She may follow-up every 2 months. She knows to call with any questions or concerns. Plan Follow-up Appointments: Return Appointment in 2 weeks. - w/  Dr. Heber [place] Anesthetic: (In clinic) Topical Lidocaine 4% applied to wound bed Cellular or Tissue Based Products: Cellular or Tissue Based Product Type: - 01/06/23 - will run insurance authorization for a an advance tissue product- theraskin Off-Loading: Low air-loss mattress (Group 2) - continue to use. Roho cushion for wheelchair - continue using your specialty cushion. Turn and reposition every 2 hours WOUND #2: - Sacrum Wound Laterality: Cleanser: Wound Cleanser (Generic) 1 x Per Day/30 Days Discharge Instructions: Cleanse the wound with wound cleanser prior to applying a clean dressing using gauze sponges, not tissue or cotton balls. Peri-Wound Care: Skin Prep (Generic) 1 x Per Day/30 Days Discharge Instructions: Use skin prep as directed Prim Dressing: Dakin's Solution 0.25%, 16 (oz) 1 x Per Day/30 Days ary Discharge Instructions: Moisten gauze with Dakin's solution packing the collagen. Prim Dressing: Promogran Prisma Matrix, 4.34 (sq in) (silver collagen) 1 x Per Day/30 Days ary Discharge Instructions: Moisten collagen with saline or hydrogel Secondary Dressing: ABD Pad, 5x9 1 x Per Day/30 Days Discharge Instructions: Apply over primary dressing as directed. Secondary Dressing: Woven Gauze Sponge, Non-Sterile 4x4 in (Generic) 1 x Per Day/30 Days Discharge Instructions: Apply over primary dressing as directed. Secured With: Child psychotherapist, Sterile 2x75 (in/in) (Generic) 1 x Per Day/30 Days Discharge Instructions: Secure with stretch gauze as directed. Secured With: 57M Medipore H Soft Cloth Surgical T ape, 4 x 10 (in/yd) (Generic) 1 x Per Day/30 Days Discharge Instructions: Secure with tape as directed. 1. Collagen with Dakin's wet-to-dry backing 2. Aggressive offloading 3. Follow-up in 2 months Electronic Signature(s) Signed: 01/06/2023 3:43:16 PM By: [name] DO Entered By: [name] on 01/06/2023  15:41:47 -------------------------------------------------------------------------------- HxROS Details Patient Name: Date of Service: [name], [name] 01/06/2023 3:15 PM Medical Record Number: [id number] Patient Account Number: [id number] Date of Birth/Sex: Treating RN: Jan 17, 1961 (63 y.o. F) Primary Care Provider: Melissa Lopez Other Clinician: Referring Provider: Treating Provider/Extender: [name] in Treatment: 7 Information Obtained From Patient Hematologic/Lymphatic Medical History: Positive for: Anemia Endocrine Medical History: Positive for: Type I Diabetes - s Time with diabetes: 71 years Treated with: Insulin Blood sugar tested every day: Yes Tested : constant with device [name], [name] ([id number]) [id number] Page 7 of 8 Medical History: Positive for: End Stage Renal Disease - off dialysis since transplants Immunological Medical History: Negative for: Lupus Erythematosus; Raynauds; Scleroderma Integumentary (Skin) Medical History: Negative for: History of Burn Musculoskeletal Medical History: Positive for: Osteoarthritis; Osteomyelitis Neurologic Medical History: Positive for: Neuropathy Oncologic Medical History: Positive for: Received Radiation Negative for: Received Chemotherapy Immunizations Pneumococcal Vaccine: Received Pneumococcal Vaccination: Yes Received Pneumococcal Vaccination On or After 60th Birthday: No Immunization Notes: states being up to date on all - unknown dates for immuization Implantable Devices None Hospitalization / Surgery History Type of Hospitalization/Surgery infected wound (pressure) 2022 (R) BKA  amp Oct 2021 UTI 12/22-12/27/2022 Family and Social History Cancer: Yes - Father; Diabetes: Yes - Father; Heart Disease: Yes - Maternal Grandparents; Hereditary Spherocytosis: No; Hypertension: Yes - Mother; Kidney Disease: No; Lung Disease: No;  Seizures: No; Stroke: No; Thyroid Problems: Yes - Mother,Siblings; Tuberculosis: No; Former smoker - from teens to 52's; Marital Status - Married; Alcohol Use: Rarely - beer; Drug Use: No History; Caffeine Use: Daily - coffee; Financial Concerns: No; Food, Clothing or Shelter Needs: No; Support System Lacking: No; Transportation Concerns: No Electronic Signature(s) Signed: 01/06/2023 3:43:16 PM By: [name] DO Entered By: [name] on 01/06/2023 15:39:16 -------------------------------------------------------------------------------- SuperBill Details Patient Name: Date of Service: [name], [name] 01/06/2023 Medical Record Number: [id number] Patient Account Number: [id number] Date of Birth/Sex: Treating RN: 1961/08/09 (62 y.o. F) Primary Care Provider: Melissa Lopez Other Clinician: Referring Provider: Treating Provider/Extender: [name] in Treatment: 7 Diagnosis Coding Thressa Sheller ([id number]) [id number] Page 8 of 8 ICD-10 Codes Code Description E10.622 Type 1 diabetes mellitus with other skin ulcer Z89.511 Acquired absence of right leg below knee Z94.0 Kidney transplant status M86.68 Other chronic osteomyelitis, other site L89.153 Pressure ulcer of sacral region, stage 3 Physician Procedures : CPT4 Code Description Modifier [id number] - WC PHYS LEVEL 3 - EST PT ICD-10 Diagnosis Description E10.622 Type 1 diabetes mellitus with other skin ulcer Z89.511 Acquired absence of right leg below knee M86.68 Other chronic osteomyelitis, other site  L89.153 Pressure ulcer of sacral region, stage 3 Quantity: 1 Electronic Signature(s) Signed: 01/06/2023 3:43:16 PM By: [name] DO Entered By: [name] on 01/06/2023 15:42:08

## 2023-02-27 ENCOUNTER — Other Ambulatory Visit: Payer: Self-pay

## 2023-02-27 ENCOUNTER — Encounter (HOSPITAL_COMMUNITY): Payer: Self-pay

## 2023-02-27 ENCOUNTER — Emergency Department (HOSPITAL_COMMUNITY)
Admission: EM | Admit: 2023-02-27 | Discharge: 2023-02-27 | Disposition: A | Discharge status: Home / Self Care | Payer: BC Managed Care – PPO | Attending: Emergency Medicine | Admitting: Emergency Medicine

## 2023-02-27 DIAGNOSIS — Z7982 Long term (current) use of aspirin: Secondary | ICD-10-CM | POA: Diagnosis not present

## 2023-02-27 DIAGNOSIS — Z794 Long term (current) use of insulin: Secondary | ICD-10-CM | POA: Diagnosis not present

## 2023-02-27 DIAGNOSIS — T83021A Displacement of indwelling urethral catheter, initial encounter: Secondary | ICD-10-CM | POA: Insufficient documentation

## 2023-02-27 DIAGNOSIS — Y838 Other surgical procedures as the cause of abnormal reaction of the patient, or of later complication, without mention of misadventure at the time of the procedure: Secondary | ICD-10-CM | POA: Insufficient documentation

## 2023-02-27 DIAGNOSIS — E109 Type 1 diabetes mellitus without complications: Secondary | ICD-10-CM | POA: Insufficient documentation

## 2023-02-27 NOTE — Discharge Instructions (Signed)
You were seen in the ER to have your foley catheter replaced.  Please follow up with the urologist as scheduled.

## 2023-02-27 NOTE — ED Triage Notes (Addendum)
Patient accidentally cut her foley catheter while cutting her underwear on Thursday. Has been urinating in her [employer] underwear since. Not in any pain. Requesting new foley.

## 2023-02-27 NOTE — ED Provider Notes (Signed)
[facility] Provider Note   CSN: [id number] Arrival date & time: 02/27/23  [id number]     History  Chief Complaint  Patient presents with   Foley Catheter Problem    [name] is a 62 y.o. female with history of chronic osteomyelitis of sacrum, type 1 diabetes/PE right BKA, renal transplant, chronic kidney disease, chronic indwelling Foley catheter, severe malnutrition who presents the emergency department requesting a new Foley catheter.  Patient states that she accidentally cut her catheter while cutting her underwear 3 days ago.  She has been using diapers since.  Normally during the week when there is a problem with the catheter, they go to the urologist, but came here since the office is closed.  She is not in any pain or distress.  Is requesting the Foley be replaced.  HPI     Home Medications Prior to Admission medications   Medication Sig Start Date End Date Taking? Authorizing Provider  acetaminophen (TYLENOL) 325 MG tablet Take 2 tablets (650 mg total) by mouth every 6 (six) hours as needed for mild pain (or Fever >/= 101). Patient taking differently: Take 650 mg by mouth every 6 (six) hours as needed for mild pain or fever. 10/06/20   Nita Sells, MD  ascorbic acid (VITAMIN C) 250 MG tablet Take 1 tablet (250 mg total) by mouth 2 (two) times daily. 12/22/21   Eugenie Filler, MD  aspirin EC 81 MG tablet Take 81 mg by mouth daily. Swallow whole.    [provider]  atorvastatin (LIPITOR) 10 MG tablet Take 1 tablet (10 mg total) by mouth daily. 03/02/21   Georgette Shell, MD  azaTHIOprine (IMURAN) 50 MG tablet Take 1 tablet (50 mg total) by mouth daily. 03/02/21   Georgette Shell, MD  B Complex-C-Folic Acid TABS Take 1 tablet by mouth daily.    [provider]  cholestyramine (QUESTRAN) 4 g packet Take 1 packet (4 g total) by mouth 2 (two) times daily. 03/02/21   Georgette Shell, MD   collagenase (SANTYL) ointment Apply topically daily. Patient taking differently: Apply 1 application topically daily. 03/02/21   Georgette Shell, MD  cycloSPORINE modified (NEORAL) 25 MG capsule Take 75 mg by mouth 2 (two) times daily. 12/13/21   [provider]  HYDROcodone bit-homatropine (HYCODAN) 5-1.5 MG/5ML syrup Take 5 mLs by mouth every 6 (six) hours as needed for cough. 12/22/21   Eugenie Filler, MD  insulin lispro (HUMALOG) 100 UNIT/ML injection Inject 0.02 mLs (2 Units total) into the skin 3 (three) times daily before meals. Sliding scale if 200-250=2 units;251-300=4units;301-350=6 units;351-400=8 units;104-450=10 units;451-500=12 units subcutaneously before meals and at bedtime for DM Patient taking differently: Inject 2-12 Units into the skin 3 (three) times daily before meals. Sliding scale if 200-250=2 units;251-300=4units;301-350=6 units;351-400=8 units;104-450=10 units;451-500=12 units subcutaneously before meals and at bedtime for DM Sliding scale ; via pump 03/02/21   Georgette Shell, MD  ketoconazole (NIZORAL) 2 % cream Apply 1 application topically daily. 11/26/21   [provider]  levothyroxine (SYNTHROID) 88 MCG tablet Take 1 tablet (88 mcg total) by mouth daily before breakfast. 03/02/21   Georgette Shell, MD  lisinopril (ZESTRIL) 5 MG tablet Take 5 mg by mouth daily. 12/19/19   [provider]  liver oil-zinc oxide (DESITIN) 40 % ointment Apply topically as needed for irritation. Patient taking differently: Apply 1 application topically daily as needed for irritation. 03/02/21   Georgette Shell,  MD  loperamide (IMODIUM) 2 MG capsule Take 1 capsule (2 mg total) by mouth as needed for diarrhea or loose stools. Patient taking differently: Take 2 mg by mouth daily as needed for diarrhea or loose stools. 03/02/21   Georgette Shell, MD  ondansetron (ZOFRAN) 4 MG tablet Take 1 tablet (4 mg total) by mouth every 6 (six) hours as needed for  nausea or vomiting. 06/26/22   Azucena Cecil, PA-C  ondansetron (ZOFRAN-ODT) 4 MG disintegrating tablet Take 1 tablet (4 mg total) by mouth every 4 (four) hours as needed for nausea or vomiting. 03/02/21   Georgette Shell, MD  oxyCODONE (OXY IR/ROXICODONE) 5 MG immediate release tablet Take 1 tablet (5 mg total) by mouth every 6 (six) hours as needed for severe pain. 03/02/21   Georgette Shell, MD  pantoprazole (PROTONIX) 40 MG tablet Take 1 tablet (40 mg total) by mouth 2 (two) times daily. Take 1 tablet twice daily x1 month, then 1 tablet daily thereafter. 12/22/21   Eugenie Filler, MD  predniSONE (DELTASONE) 5 MG tablet Take 1 tablet (5 mg total) by mouth daily with breakfast. 03/02/21   Georgette Shell, MD  saccharomyces boulardii (FLORASTOR) 250 MG capsule Take 1 capsule (250 mg total) by mouth 2 (two) times daily. Patient not taking: Reported on 12/17/2021 03/02/21   Georgette Shell, MD  sertraline (ZOLOFT) 50 MG tablet Take 50 mg by mouth daily.    [provider]  sodium hypochlorite (DAKIN'S 1/2 STRENGTH) external solution Apply 1 application topically daily. 05/14/21   [provider]  sucralfate (CARAFATE) 1 GM/10ML suspension Take 10 mLs (1 g total) by mouth 4 (four) times daily -  with meals and at bedtime. 12/22/21 01/21/22  Eugenie Filler, MD  triamcinolone (NASACORT) 55 MCG/ACT AERO nasal inhaler USE 1 SPRAY IN [facility] NOSTRIL Patient not taking: Reported on 12/17/2021 07/13/21   Rozetta Nunnery, MD      Allergies    Patient has no known allergies.    Review of Systems   Review of Systems  All other systems reviewed and are negative.   Physical Exam Updated Vital Signs BP (!) 156/84 (BP Location: Left Arm)   Pulse 82   Temp 97.6 F (36.4 C) (Oral)   Resp 16   Ht '5\' 3"'$  (1.6 m)   Wt 61.2 kg   SpO2 99%   BMI 23.91 kg/m  Physical Exam Vitals and nursing note reviewed.  Constitutional:      Appearance: Normal appearance.   HENT:     Head: Normocephalic and atraumatic.  Eyes:     Conjunctiva/sclera: Conjunctivae normal.  Pulmonary:     Effort: Pulmonary effort is normal. No respiratory distress.  Genitourinary:    Comments: New foley catheter placed, draining light yellow urine Skin:    General: Skin is warm and dry.  Neurological:     Mental Status: She is alert.  Psychiatric:        Mood and Affect: Mood normal.        Behavior: Behavior normal.     ED Results / Procedures / Treatments   Labs (all labs ordered are listed, but only abnormal results are displayed) Labs Reviewed - No data to display  EKG None  Radiology No results found.  Procedures Procedures    Medications Ordered in ED Medications - No data to display  ED Course/ Medical Decision Making/ A&P  Medical Decision Making  Patient is a 62 year old female with extensive past medical history including chronic osteomyelitis of sacrum, type 1 diabetes/PE right BKA, renal transplant, chronic kidney disease, chronic indwelling Foley catheter, severe malnutrition.  She presents today requesting new Foley catheter as she accidentally cut her previous one a few days ago.  She has no complaints at this time.  No abdominal pain, fever, nausea, vomiting, dysuria, hematuria.  Foley catheter was replaced without difficulty.  Patient states that she is feeling well and wants to go home.  Do not believe that she is requiring further evaluation at this time.  Recommended that she follow-up with urology as scheduled, and she plans to do this.  Patient discharged in stable condition all questions answered.  Final Clinical Impression(s) / ED Diagnoses Final diagnoses:  Displacement of Foley catheter, initial encounter [facility])    Rx / DC Orders ED Discharge Orders     None      Portions of this report may have been transcribed using voice recognition software. Every effort was made to ensure accuracy;  however, inadvertent computerized transcription errors may be present.    Estill Cotta 02/27/23 1136    Pattricia Boss, MD 02/27/23 [phone]

## 2023-03-03 ENCOUNTER — Ambulatory Visit (HOSPITAL_BASED_OUTPATIENT_CLINIC_OR_DEPARTMENT_OTHER): Payer: BC Managed Care – PPO | Admitting: Internal Medicine

## 2023-03-17 ENCOUNTER — Ambulatory Visit (HOSPITAL_BASED_OUTPATIENT_CLINIC_OR_DEPARTMENT_OTHER): Payer: BC Managed Care – PPO | Admitting: Internal Medicine

## 2023-04-05 ENCOUNTER — Encounter (HOSPITAL_BASED_OUTPATIENT_CLINIC_OR_DEPARTMENT_OTHER): Payer: BC Managed Care – PPO | Attending: Internal Medicine | Admitting: Internal Medicine

## 2023-04-05 DIAGNOSIS — N186 End stage renal disease: Secondary | ICD-10-CM | POA: Insufficient documentation

## 2023-04-05 DIAGNOSIS — M8668 Other chronic osteomyelitis, other site: Secondary | ICD-10-CM | POA: Diagnosis not present

## 2023-04-05 DIAGNOSIS — Z87891 Personal history of nicotine dependence: Secondary | ICD-10-CM | POA: Insufficient documentation

## 2023-04-05 DIAGNOSIS — E1069 Type 1 diabetes mellitus with other specified complication: Secondary | ICD-10-CM | POA: Insufficient documentation

## 2023-04-05 DIAGNOSIS — L89153 Pressure ulcer of sacral region, stage 3: Secondary | ICD-10-CM | POA: Insufficient documentation

## 2023-04-05 DIAGNOSIS — Z940 Kidney transplant status: Secondary | ICD-10-CM | POA: Diagnosis not present

## 2023-04-05 DIAGNOSIS — Z89511 Acquired absence of right leg below knee: Secondary | ICD-10-CM | POA: Insufficient documentation

## 2023-04-05 DIAGNOSIS — E1022 Type 1 diabetes mellitus with diabetic chronic kidney disease: Secondary | ICD-10-CM | POA: Diagnosis not present

## 2023-04-05 DIAGNOSIS — E10622 Type 1 diabetes mellitus with other skin ulcer: Secondary | ICD-10-CM | POA: Diagnosis present

## 2023-04-05 DIAGNOSIS — Z833 Family history of diabetes mellitus: Secondary | ICD-10-CM | POA: Insufficient documentation

## 2023-04-05 DIAGNOSIS — E1040 Type 1 diabetes mellitus with diabetic neuropathy, unspecified: Secondary | ICD-10-CM | POA: Diagnosis not present

## 2023-04-05 NOTE — Progress Notes (Signed)
[name], Tura ([id number]) [id number] Page 1 of 7 Visit Report for 04/05/2023 Arrival Information Details Patient Name: Date of Service: [name], Doris THLEEN 04/05/2023 3:00 PM Medical Record Number: [id number] Patient Account Number: [id number] Date of Birth/Sex: Treating RN: [birth year] (62 y.o. [name]) Lopez, Doris Primary Care Belen Zwahlen: [name], Brenda Other Clinician: Referring Denitra Donaghey: Treating Macy Polio/Extender: Domingo CockingHoffman, [name], Brenda [name]: 20 Visit Information History Since Last Visit Added or deleted any medications: No Patient Arrived: Wheel Chair Any new allergies or adverse reactions: No Arrival Time: 15:04 Had a fall or experienced change in No Accompanied By: husband activities of daily living that may affect Transfer Assistance: Manual risk of falls: Patient Identification Verified: Yes Signs or symptoms of abuse/neglect since last visito No Secondary Verification Process Completed: Yes Hospitalized since last visit: No Patient Requires Transmission-Based Precautions: No Implantable device outside of the clinic excluding No Patient Has Alerts: No cellular tissue based products placed in the center since last visit: Has Dressing in Place as Prescribed: Yes Pain Present Now: No Electronic Signature(s) Signed: 04/05/2023 3:40:23 PM By: Redmond PullingPalmer, Carrie RN, BSN Entered By: Redmond PullingPalmer, Doris on 04/05/2023 15:05:13 -------------------------------------------------------------------------------- Encounter Discharge Information Details Patient Name: Date of Service: [name], Doris THLEEN 04/05/2023 3:00 PM Medical Record Number: [id number] Patient Account Number: [id number] Date of Birth/Sex: Treating RN: [birth year] (62 y.o. [name]) Lopez, Doris Primary Care Arriel Victor: [name], Brenda Other Clinician: Referring Darrion Wyszynski: Treating Joshia Kitchings/Extender: Domingo CockingHoffman, [name], Brenda [name]: 20 Encounter Discharge Information  Items Post Procedure Vitals Discharge Condition: Stable Temperature (F): 98.2 Ambulatory Status: Wheelchair Pulse (bpm): 70 Discharge Destination: Home Respiratory Rate (breaths/min): 18 Transportation: Private Auto Blood Pressure (mmHg): 170/77 Accompanied By: husband Schedule Follow-up Appointment: Yes Clinical Summary of Care: Patient Declined Electronic Signature(s) Signed: 04/05/2023 3:40:23 PM By: Redmond PullingPalmer, Carrie RN, BSN Entered By: Redmond PullingPalmer, Doris on 04/05/2023 15:37:12 -------------------------------------------------------------------------------- Lower Extremity Assessment Details Patient Name: Date of Service: [name], Doris THLEEN 04/05/2023 3:00 PM Medical Record Number: [id number] Patient Account Number: [id number] Date of Birth/Sex: Treating RN: [birth year] (62 y.o. [name]) Lopez, Doris Primary Care [name]: [name], Brenda Other Clinician: Referring [name]: Treating [name]/Extender: [name], [name], Brenda [name]: 20 Electronic Signature(s) Signed: 04/05/2023 3:40:23 PM By: Redmond PullingPalmer, Carrie RN, BSN Penn WynneMURPHY, [name] ([id number]) [id number] Page 2 of 7 Entered By: Redmond PullingPalmer, Doris on 04/05/2023 15:05:58 -------------------------------------------------------------------------------- Multi Wound Chart Details Patient Name: Date of Service: [name], Doris THLEEN 04/05/2023 3:00 PM Medical Record Number: [id number] Patient Account Number: [id number] Date of Birth/Sex: Treating RN: [birth year] (62 y.o. F) Primary Care [name]: [name], Brenda Other Clinician: Referring [name]: Treating [name]/Extender: Domingo CockingHoffman, [name], Brenda [name]: 20 Vital Signs Height(in): Capillary Blood Glucose(mg/dl): 97 Weight(lbs): Pulse(bpm): 70 Body Mass Index(BMI): Blood Pressure(mmHg): 170/77 Temperature(F): 98.2 Respiratory Rate(breaths/min): 18 [2:Photos:] [N/A:No Photos N/A] Sacrum Sacrum N/A Wound  Location: Pressure Injury Pressure Injury N/A Wounding Event: Pressure Ulcer Pressure Ulcer N/A Primary Etiology: Anemia, Type I Diabetes, End Stage Anemia, Type I Diabetes, End Stage N/A Comorbid History: Renal Disease, Osteoarthritis, Renal Disease, Osteoarthritis, Osteomyelitis, Neuropathy, Received Osteomyelitis, Neuropathy, Received Radiation Radiation 10/27/2020 10/27/2020 N/A Date Acquired: 20 20 N/A Weeks of Treatment: Open Open N/A Wound Status: No No N/A Wound Recurrence: 2x2x0.9 2x2x0.9 N/A Measurements L x W x D (cm) 3.142 3.142 N/A A (cm) : rea 2.827 2.827 N/A Volume (cm) : 33.30% 33.30% N/A % Reduction in A rea: 60.00% 60.00% N/A % Reduction in Volume: 1 1 Position 1 (o'clock): 2 2 Maximum Distance 1 (cm): Yes Yes N/A Tunneling: Category/Stage III Category/Stage III N/A Classification: Medium  Medium N/A Exudate A mount: Serosanguineous Serosanguineous N/A Exudate Type: red, brown red, brown N/A Exudate Color: Distinct, outline attached Distinct, outline attached N/A Wound Margin: Large (67-100%) Large (67-100%) N/A Granulation A mount: Red, Pink Red, Pink, Hyper-granulation N/A Granulation Quality: Small (1-33%) Small (1-33%) N/A Necrotic A mount: Fat Layer (Subcutaneous Tissue): Yes Fat Layer (Subcutaneous Tissue): Yes N/A Exposed Structures: Fascia: No Fascia: No Tendon: No Tendon: No Muscle: No Muscle: No Joint: No Joint: No Bone: No Bone: No Small (1-33%) Small (1-33%) N/A Epithelialization: Chemical/Enzymatic/Mechanical Chemical/Enzymatic/Mechanical N/A Debridement: Pre-procedure Verification/Time Out 15:15 15:15 N/A Taken: Lidocaine 5% topical ointment Lidocaine 5% topical ointment N/A Pain Control: Other(silver nitrate) Other(silver nitrate) N/A Instrument: None None N/A Bleeding: 0 0 N/A Procedural Pain: 0 0 N/A Post Procedural Pain: Procedure was tolerated well Procedure was tolerated well N/A Debridement Treatment  Response: 2x2x0.9 2x2x0.9 N/A Post Debridement Measurements L x W x D (cm) 2.827 2.827 N/A Post Debridement Volume: (cm) Category/Stage III Category/Stage III N/A Post Debridement Stage: Excoriation: No Excoriation: No N/A Periwound Skin Texture: Induration: No Induration: No SAWYER, HARGADON ([id number]) [id number] Page 3 of 7 Callus: No Callus: No Crepitus: No Crepitus: No Rash: No Rash: No Scarring: No Scarring: No Maceration: No Maceration: No N/A Periwound Skin Moisture: Dry/Scaly: No Dry/Scaly: No Atrophie Blanche: No Atrophie Blanche: No N/A Periwound Skin Color: Cyanosis: No Cyanosis: No Ecchymosis: No Ecchymosis: No Erythema: No Erythema: No Hemosiderin Staining: No Hemosiderin Staining: No Mottled: No Mottled: No Pallor: No Pallor: No Rubor: No Rubor: No No Abnormality No Abnormality N/A Temperature: Yes Yes N/A Tenderness on Palpation: Debridement Debridement N/A Procedures Performed: Treatment Notes Electronic Signature(s) Signed: 04/05/2023 3:27:57 PM By: Geralyn Corwin DO Entered By: Geralyn Corwin on 04/05/2023 15:24:25 -------------------------------------------------------------------------------- Multi-Disciplinary Care Plan Details Patient Name: Date of Service: REBA, DIANNA 04/05/2023 3:00 PM Medical Record Number: [id number] Patient Account Number: [id number] Date of Birth/Sex: Treating RN: 07/05/1961 (62 y.o. Doris Govern Primary Care Kenric Ginger: Doris Duke Other Clinician: Referring Salih Williamson: Treating Pavneet Markwood/Extender: Domingo Cocking in Treatment: 20 Active Inactive Nutrition Nursing Diagnoses: Potential for alteratiion in Nutrition/Potential for imbalanced nutrition Goals: Patient/caregiver agrees to and verbalizes understanding of need to obtain nutritional consultation Date Initiated: 11/12/2022 Target Resolution Date: 04/27/2023 Goal Status:  Active Interventions: Provide education on nutrition Treatment Activities: Education provided on Nutrition : 11/12/2022 Patient referred to Primary Care Physician for further nutritional evaluation : 11/12/2022 Notes: Pressure Nursing Diagnoses: Potential for impaired tissue integrity related to pressure, friction, moisture, and shear Goals: Patient/caregiver will verbalize risk factors for pressure ulcer development Date Initiated: 11/12/2022 Target Resolution Date: 05/11/2023 Goal Status: Active Patient/caregiver will verbalize understanding of pressure ulcer management Date Initiated: 11/12/2022 Target Resolution Date: 05/11/2023 Goal Status: Active Interventions: Assess: immobility, friction, shearing, incontinence upon admission and as needed Assess potential for pressure ulcer upon admission and as needed Provide education on pressure ulcers Treatment Activities: NEOMIE, CARREON ([id number]) [id number] Page 4 of 7 Patient referred for pressure reduction/relief devices : 11/12/2022 Notes: Electronic Signature(s) Signed: 04/05/2023 3:40:23 PM By: Redmond Pulling RN, BSN Entered By: Redmond Pulling on 04/05/2023 15:13:54 -------------------------------------------------------------------------------- Pain Assessment Details Patient Name: Date of Service: LI, SANSOM 04/05/2023 3:00 PM Medical Record Number: [id number] Patient Account Number: [id number] Date of Birth/Sex: Treating RN: August 31, 1961 (62 y.o. Doris Govern Primary Care Fredis Malkiewicz: Doris Duke Other Clinician: Referring Haleema Vanderheyden: Treating Daven Pinckney/Extender: Doris Ham [name]: 20 Active Problems Location of Pain Severity and Description of Pain Patient Has Paino No Site Locations Pain Management  and Medication Current Pain Management: Electronic Signature(s) Signed: 04/05/2023 3:40:23 PM By: Redmond Pulling RN, BSN Entered By: Redmond Pulling  on 04/05/2023 15:05:43 -------------------------------------------------------------------------------- Patient/Caregiver Education Details Patient Name: Date of Service: EVOLETH, DUBEAU 4/9/2024andnbsp3:00 PM Medical Record Number: [id number] Patient Account Number: [id number] Date of Birth/Gender: Treating RN: 09-20-1961 (62 y.o. Doris Govern Primary Care Physician: Doris Duke Other Clinician: Referring Physician: Treating Physician/Extender: Domingo Cocking in Treatment: 20 Education Assessment Education Provided To: Patient Education Topics Provided Wound/Skin Impairment: Methods: Explain/Verbal Responses: State content correctly VIHANA, MANGANELLI ([id number]) [id number] Page 5 of 7 Electronic Signature(s) Signed: 04/05/2023 3:40:23 PM By: Redmond Pulling RN, BSN Entered By: Redmond Pulling on 04/05/2023 15:16:43 -------------------------------------------------------------------------------- Wound Assessment Details Patient Name: Date of Service: BISMAH, WEINTRAUB 04/05/2023 3:00 PM Medical Record Number: [id number] Patient Account Number: [id number] Date of Birth/Sex: Treating RN: 1961/07/04 (62 y.o. Doris Govern Primary Care Roy Tokarz: Doris Duke Other Clinician: Referring Edra Riccardi: Treating Garry Bochicchio/Extender: Doris Ham [name]: 20 Wound Status Wound Number: 2 Primary Pressure Ulcer Etiology: Wound Location: Sacrum Wound Open Wounding Event: Pressure Injury Status: Date Acquired: 10/27/2020 Comorbid Anemia, Type I Diabetes, End Stage Renal Disease, Osteoarthritis, Weeks Of Treatment: 20 History: Osteomyelitis, Neuropathy, Received Radiation Clustered Wound: No Photos Wound Measurements Length: (cm) 2 Width: (cm) 2 Depth: (cm) 0.9 Area: (cm) 3.142 Volume: (cm) 2.827 % Reduction in Area: 33.3% % Reduction in Volume: 60% Epithelialization: Small  (1-33%) Tunneling: Yes Position (o'clock): 1 Maximum Distance: (cm) 2 Undermining: No Wound Description Classification: Category/Stage III Wound Margin: Distinct, outline attached Exudate Amount: Medium Exudate Type: Serosanguineous Exudate Color: red, brown Foul Odor After Cleansing: No Slough/Fibrino Yes Wound Bed Granulation Amount: Large (67-100%) Exposed Structure Granulation Quality: Red, Pink Fascia Exposed: No Necrotic Amount: Small (1-33%) Fat Layer (Subcutaneous Tissue) Exposed: Yes Necrotic Quality: Adherent Slough Tendon Exposed: No Muscle Exposed: No Joint Exposed: No Bone Exposed: No Periwound Skin Texture Texture Color No Abnormalities Noted: No No Abnormalities Noted: No Callus: No Atrophie Blanche: No Crepitus: No Cyanosis: No Excoriation: No Ecchymosis: No Induration: No Erythema: No JACQUALYNN, EVERETT ([id number]) [id number] Page 6 of 7 Rash: No Hemosiderin Staining: No Scarring: No Mottled: No Pallor: No Moisture Rubor: No No Abnormalities Noted: No Dry / Scaly: No Temperature / Pain Maceration: No Temperature: No Abnormality Tenderness on Palpation: Yes Electronic Signature(s) Signed: 04/05/2023 3:40:23 PM By: Redmond Pulling RN, BSN Entered By: Redmond Pulling on 04/05/2023 15:12:42 -------------------------------------------------------------------------------- Wound Assessment Details Patient Name: Date of Service: JORDYAN, SARTELL 04/05/2023 3:00 PM Medical Record Number: [id number] Patient Account Number: [id number] Date of Birth/Sex: Treating RN: 09/05/1961 (62 y.o. Doris Govern Primary Care Henleigh Robello: Doris Duke Other Clinician: Referring Anniah Glick: Treating Kaari Zeigler/Extender: Doris Ham [name]: 20 Wound Status Wound Number: 2 Primary Pressure Ulcer Etiology: Wound Location: Sacrum Wound Open Wounding Event: Pressure Injury Status: Date Acquired:  10/27/2020 Comorbid Anemia, Type I Diabetes, End Stage Renal Disease, Osteoarthritis, Weeks Of Treatment: 20 History: Osteomyelitis, Neuropathy, Received Radiation Clustered Wound: No Wound Measurements Length: (cm) 2 Width: (cm) 2 Depth: (cm) 0.9 Area: (cm) 3.142 Volume: (cm) 2.827 % Reduction in Area: 33.3% % Reduction in Volume: 60% Epithelialization: Small (1-33%) Tunneling: Yes Position (o'clock): 1 Maximum Distance: (cm) 2 Undermining: No Wound Description Classification: Category/Stage III Wound Margin: Distinct, outline attached Exudate Amount: Medium Exudate Type: Serosanguineous Exudate Color: red, brown Foul Odor After Cleansing: No Slough/Fibrino Yes Wound Bed Granulation Amount: Large (67-100%) Exposed Structure Granulation Quality: Red, Pink, Hyper-granulation Fascia Exposed: No Necrotic  Amount: Small (1-33%) Fat Layer (Subcutaneous Tissue) Exposed: Yes Necrotic Quality: Adherent Slough Tendon Exposed: No Muscle Exposed: No Joint Exposed: No Bone Exposed: No Periwound Skin Texture Texture Color No Abnormalities Noted: No No Abnormalities Noted: No Callus: No Atrophie Blanche: No Crepitus: No Cyanosis: No Excoriation: No Ecchymosis: No Induration: No Erythema: No Rash: No Hemosiderin Staining: No Scarring: No Mottled: No Pallor: No Moisture Rubor: No No Abnormalities Noted: No Dry / Scaly: No Temperature / Pain Maceration: No Temperature: No Abnormality Tenderness on PalpationGITA, DILGER ([id number]) [id number] Page 7 of 7 Treatment Notes Wound #2 (Sacrum) Cleanser Wound Cleanser Discharge Instruction: Cleanse the wound with wound cleanser prior to applying a clean dressing using gauze sponges, not tissue or cotton balls. Peri-Wound Care Skin Prep Discharge Instruction: Use skin prep as directed Topical Primary Dressing Dakin's Solution 0.25%, 16 (oz) Discharge Instruction: Moisten gauze with  Dakin's solution packing the collagen. Promogran Prisma Matrix, 4.34 (sq in) (silver collagen) Discharge Instruction: Moisten collagen with saline or hydrogel Secondary Dressing ABD Pad, 5x9 Discharge Instruction: Apply over primary dressing as directed. Woven Gauze Sponge, Non-Sterile 4x4 in Discharge Instruction: Apply over primary dressing as directed. Secured With Conforming Stretch Gauze Bandage, Sterile 2x75 (in/in) Discharge Instruction: Secure with stretch gauze as directed. 51M Medipore H Soft Cloth Surgical T ape, 4 x 10 (in/yd) Discharge Instruction: Secure with tape as directed. Compression Wrap Compression Stockings Add-Ons Electronic Signature(s) Signed: 04/05/2023 3:40:23 PM By: Redmond Pulling RN, BSN Entered By: Redmond Pulling on 04/05/2023 15:13:13 -------------------------------------------------------------------------------- Vitals Details Patient Name: Date of Service: GERARDINE, PELTZ 04/05/2023 3:00 PM Medical Record Number: [id number] Patient Account Number: [id number] Date of Birth/Sex: Treating RN: 24-Dec-1961 (62 y.o. Doris Govern Primary Care Derwood Becraft: Doris Duke Other Clinician: Referring Harlow Basley: Treating Eberardo Demello/Extender: Doris Ham [name]: 20 Vital Signs Time Taken: 15:05 Temperature (F): 98.2 Pulse (bpm): 70 Respiratory Rate (breaths/min): 18 Blood Pressure (mmHg): 170/77 Capillary Blood Glucose (mg/dl): 97 Reference Range: 80 - 120 mg / dl Electronic Signature(s) Signed: 04/05/2023 3:40:23 PM By: Redmond Pulling RN, BSN Entered By: Redmond Pulling on 04/05/2023 15:05:36

## 2023-04-05 NOTE — Progress Notes (Signed)
JANANI, CHAMBER ([id number]) [id number] Page 1 of 8 Visit Report for 04/05/2023 Chief Complaint Document Details Patient Name: Date of Service: [name], [name] 04/05/2023 3:00 PM Medical Record Number: [id number] Patient Account Number: [id number] Date of Birth/Sex: Treating RN: 1961/09/24 (62 y.o. F) Primary Care Provider: Abran Duke Other Clinician: Referring Provider: Treating Provider/Extender: Domingo Cocking in Treatment: 20 Information Obtained from: Patient Chief Complaint Sacral ulcer Electronic Signature(s) Signed: 04/05/2023 3:27:57 PM By: Geralyn Corwin DO Entered By: Geralyn Corwin on 04/05/2023 15:24:33 -------------------------------------------------------------------------------- Debridement Details Patient Name: Date of Service: [name], [name] 04/05/2023 3:00 PM Medical Record Number: [id number] Patient Account Number: [id number] Date of Birth/Sex: Treating RN: [birth year] (62 y.o. Orville Govern Primary Care Provider: Abran Duke Other Clinician: Referring Provider: Treating Provider/Extender: Domingo Cocking in Treatment: 20 Debridement Performed for Assessment: Wound #2 Sacrum Performed By: Physician Geralyn Corwin, DO Debridement Type: Chemical/Enzymatic/Mechanical Agent Used: silver nitrate Level of Consciousness (Pre-procedure): Awake and Alert Pre-procedure Verification/Time Out Yes - 15:15 Taken: Start Time: 15:20 Pain Control: Lidocaine 5% topical ointment Instrument: Other : silver nitrate Bleeding: None Procedural Pain: 0 Post Procedural Pain: 0 Response to Treatment: Procedure was tolerated well Level of Consciousness (Post- Awake and Alert procedure): Post Debridement Measurements of Total Wound Length: (cm) 2 Stage: Category/Stage III Width: (cm) 2 Depth: (cm) 0.9 Volume: (cm) 2.827 Character of Wound/Ulcer Post Debridement: Improved Post  Procedure Diagnosis Same as Pre-procedure Notes Scribed for Dr Mikey Bussing by Redmond Pulling, RN Electronic Signature(s) Signed: 04/05/2023 3:27:57 PM By: Geralyn Corwin DO Signed: 04/05/2023 3:40:23 PM By: Redmond Pulling RN, BSN Entered By: Redmond Pulling on 04/05/2023 15:22:50 Gala Murdoch ([id number]) [id number] Page 2 of 8 -------------------------------------------------------------------------------- HPI Details Patient Name: Date of Service: [name], [name] 04/05/2023 3:00 PM Medical Record Number: [id number] Patient Account Number: [id number] Date of Birth/Sex: Treating RN: [birth year] (62 y.o. F) Primary Care Provider: Abran Duke Other Clinician: Referring Provider: Treating Provider/Extender: Domingo Cocking in Treatment: 20 History of Present Illness HPI Description: Admission 6/24 [name] is a 62 year old female with a past medical history of type 1 diabetes, right BKA, and kidney transplant that presents to the clinic for an 13-month history of sacral ulcer. She has been using wet-to-dry dressings up until 5 weeks ago when she was started on a wound VAC. 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 She has completed 6 weeks of treatment and states she has been discharged by infectious disease. She currently denies any issues to the wound area. She reports minimal pain. She denies signs of infection. 7/11; patient presents for 2-week follow-up. She uses a wound VAC that is changed with home health 3 times a week. She denies any signs of infection. She has no complaints or issues today. 8/1; patient presents for follow-up. She continues to use a wound VAC and this is changed with home health. She reports pain to the sacrum that started 1 week ago. She denies systemic signs of infection. 8/15; patient presents for 2-week follow-up. She has been using wet-to-dry  dressings daily. She reports improvement in the wound size and appearance. She denies infectious symptoms. 9/12; patient presents for follow-up. She has been using wet-to-dry Dakin's moistened gauze. She reports following up with infectious disease and is currently taking amoxicillin. 10/6; patient presents for follow-up. She has been using Dakin's wet-to-dry moistened gauze dressings. She is still taking amoxicillin. She canceled her appointment with infectious disease yesterday because she is  tired of going to doctors appointments. She has not picked up gentamicin cream from the pharmacy to use on the wound bed. She currently denies signs of infection. 11/3; patient presents for follow-up. She has been using Dakin's wet-to-dry dressings. She has no issues or complaints today. She denies signs of infection. 12/1; patient presents for follow-up. She continues to use Dakin's wet-to-dry dressings. Patient followed up with Dr. Daiva Eves, ID and amoxicillin was stopped as she had completed her course for sacral osteomyelitis. She reports itching to the periwound. She denies signs of infection. 1/6; patient presents for follow-up. She has been using Dakin's wet-to-dry dressings. She never received the wound VAC. She reports improvement with ketoconazole to the periwound. She has no issues or complaints today. She denies signs of infection. 3/17; patient presents for follow-up. She has been using Dakin's wet-to-dry dressings. She has no issues or complaints today. 3/30; patient presents for follow-up. She has been using collagen with Dakin's wet-to-dry packing. She has no issues or complaints today. She does state that she would like to try and do a muscle flap to help close the wound. 4/18; patient presents for follow-up. She changed her mind on a muscle flap. She would like to try a wound VAC. We went ahead and ordered this and she has been using it for the past week. She has no issues or complaints today.  She denies signs of infection. 5/25; patient presents for follow-up. She has been using the wound VAC and home health is coming in Monday Wednesday Friday to change it. She denies signs of infection. 11/12/2022; Patient was referred to wound care/plastic surgery for evaluation of potential muscle flap to the sacrum. Patient saw Dr. Mardene Speak on 07/28/2022. Unfortunately patient is not a flap candidate due to her multiple comorbidities. Patient has been using Dakin's wet-to-dry dressings. 1/11; patient presents for follow-up. She has been using collagen with Dakin's wet-to-dry packing. She has no issues or complaints today. 4/9; patient presents for follow-up. She is been using collagen with Dakin's wet-to-dry backing. She has no issues or complaints today. She denies signs of infection. Electronic Signature(s) Signed: 04/05/2023 3:27:57 PM By: Geralyn Corwin DO Entered By: Geralyn Corwin on 04/05/2023 15:24:55 -------------------------------------------------------------------------------- Physical Exam Details Patient Name: Date of Service: [name], [name] 04/05/2023 3:00 PM Medical Record Number: [id number] Patient Account Number: [id number] Date of Birth/Sex: Treating RN: 04/29/1961 (61 y.o. F) Primary Care Provider: Abran Duke Other Clinician: Referring Provider: Treating Provider/Extender: Garey Ham [name]: 20 Constitutional respirations regular, non-labored and within target range for patient.Marland Kitchen Psychiatric pleasant and cooperative. KEVIN, MARIO ([id number]) [id number] Page 3 of 8 Notes Sacral region: open wound with pale hypertrophic granulation tissue present. Undermining circumferentially with increased depth in the center. No signs of surrounding soft tissue infection. Electronic Signature(s) Signed: 04/05/2023 3:27:57 PM By: Geralyn Corwin DO Entered By: Geralyn Corwin on 04/05/2023  15:26:01 -------------------------------------------------------------------------------- Physician Orders Details Patient Name: Date of Service: [name], [name] 04/05/2023 3:00 PM Medical Record Number: [id number] Patient Account Number: [id number] Date of Birth/Sex: Treating RN: 09-Apr-1961 (62 y.o. Orville Govern Primary Care Provider: Abran Duke Other Clinician: Referring Provider: Treating Provider/Extender: Domingo Cocking in Treatment: 20 Verbal / Phone Orders: No Diagnosis Coding Follow-up Appointments Other: - 2 months w/ Dr. Mikey Bussing Anesthetic (In clinic) Topical Lidocaine 4% applied to wound bed Cellular or Tissue Based Products Cellular or Tissue Based Product Type: - 01/06/23 - will run insurance authorization for a an advance tissue product- theraskin Off-Loading  Low air-loss mattress (Group 2) - continue to use. Roho cushion for wheelchair - continue using your specialty cushion. Turn and reposition every 2 hours Wound Treatment Wound #2 - Sacrum Cleanser: Wound Cleanser (Generic) 1 x Per Day/30 Days Discharge Instructions: Cleanse the wound with wound cleanser prior to applying a clean dressing using gauze sponges, not tissue or cotton balls. Peri-Wound Care: Skin Prep (Generic) 1 x Per Day/30 Days Discharge Instructions: Use skin prep as directed Prim Dressing: Dakin's Solution 0.25%, 16 (oz) 1 x Per Day/30 Days ary Discharge Instructions: Moisten gauze with Dakin's solution packing the collagen. Prim Dressing: Promogran Prisma Matrix, 4.34 (sq in) (silver collagen) 1 x Per Day/30 Days ary Discharge Instructions: Moisten collagen with saline or hydrogel Secondary Dressing: ABD Pad, 5x9 1 x Per Day/30 Days Discharge Instructions: Apply over primary dressing as directed. Secondary Dressing: Woven Gauze Sponge, Non-Sterile 4x4 in (Generic) 1 x Per Day/30 Days Discharge Instructions: Apply over primary dressing as directed. Secured  With: Insurance underwriter, Sterile 2x75 (in/in) (Generic) 1 x Per Day/30 Days Discharge Instructions: Secure with stretch gauze as directed. Secured With: 94M Medipore H Soft Cloth Surgical T ape, 4 x 10 (in/yd) (DME) (Generic) 1 x Per Day/30 Days Discharge Instructions: Secure with tape as directed. Electronic Signature(s) Signed: 04/05/2023 3:27:57 PM By: Geralyn Corwin DO Entered By: Geralyn Corwin on 04/05/2023 15:26:11 Problem List Details -------------------------------------------------------------------------------- Gala Murdoch ([id number]) [id number] Page 4 of 8 Patient Name: Date of Service: AUDIANNA, [name] 04/05/2023 3:00 PM Medical Record Number: [id number] Patient Account Number: [id number] Date of Birth/Sex: Treating RN: Aug 13, 1961 (62 y.o. F) Primary Care Provider: Abran Duke Other Clinician: Referring Provider: Treating Provider/Extender: Garey Ham [name]: 20 Active Problems ICD-10 Encounter Code Description Active Date MDM Diagnosis E10.622 Type 1 diabetes mellitus with other skin ulcer 11/12/2022 No Yes Z89.511 Acquired absence of right leg below knee 11/12/2022 No Yes Z94.0 Kidney transplant status 11/12/2022 No Yes M86.68 Other chronic osteomyelitis, other site 11/12/2022 No Yes L89.153 Pressure ulcer of sacral region, stage 3 11/12/2022 No Yes Inactive Problems Resolved Problems Electronic Signature(s) Signed: 04/05/2023 3:27:57 PM By: Geralyn Corwin DO Entered By: Geralyn Corwin on 04/05/2023 15:24:17 -------------------------------------------------------------------------------- Progress Note Details Patient Name: Date of Service: HEIDI, MACLIN 04/05/2023 3:00 PM Medical Record Number: [id number] Patient Account Number: [id number] Date of Birth/Sex: Treating RN: September 12, 1961 (62 y.o. F) Primary Care Provider: Abran Duke Other Clinician: Referring  Provider: Treating Provider/Extender: Domingo Cocking in Treatment: 20 Subjective Chief Complaint Information obtained from Patient Sacral ulcer History of Present Illness (HPI) Admission 6/24 Ms. Harleen Fineberg is a 62 year old female with a past medical history of type 1 diabetes, right BKA, and kidney transplant that presents to the clinic for an 66-month history of sacral ulcer. She has been using wet-to-dry dressings up until 5 weeks ago when she was started on a wound VAC. 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 She has completed 6 weeks of treatment and states she has been discharged by infectious disease. She currently denies any issues to the wound area. She reports minimal pain. She denies signs of infection. 7/11; patient presents for 2-week follow-up. She uses a wound VAC that is changed with home health 3 times a week. She denies any signs of infection. She has no complaints or issues today. 8/1; patient presents for follow-up. She continues to use a wound VAC and this is changed with home health. She reports pain  to the sacrum that started 1 week ago. She denies systemic signs of infection. 8/15; patient presents for 2-week follow-up. She has been using wet-to-dry dressings daily. She reports improvement in the wound size and appearance. She denies infectious symptoms. 9/12; patient presents for follow-up. She has been using wet-to-dry Dakin's moistened gauze. She reports following up with infectious disease and is currently [name], [name] ([id number]) [id number] Page 5 of 8 taking amoxicillin. 10/6; patient presents for follow-up. She has been using Dakin's wet-to-dry moistened gauze dressings. She is still taking amoxicillin. She canceled her appointment with infectious disease yesterday because she is tired of going to doctors appointments. She has not picked up gentamicin  cream from the pharmacy to use on the wound bed. She currently denies signs of infection. 11/3; patient presents for follow-up. She has been using Dakin's wet-to-dry dressings. She has no issues or complaints today. She denies signs of infection. 12/1; patient presents for follow-up. She continues to use Dakin's wet-to-dry dressings. Patient followed up with Dr. Daiva Eves, ID and amoxicillin was stopped as she had completed her course for sacral osteomyelitis. She reports itching to the periwound. She denies signs of infection. 1/6; patient presents for follow-up. She has been using Dakin's wet-to-dry dressings. She never received the wound VAC. She reports improvement with ketoconazole to the periwound. She has no issues or complaints today. She denies signs of infection. 3/17; patient presents for follow-up. She has been using Dakin's wet-to-dry dressings. She has no issues or complaints today. 3/30; patient presents for follow-up. She has been using collagen with Dakin's wet-to-dry packing. She has no issues or complaints today. She does state that she would like to try and do a muscle flap to help close the wound. 4/18; patient presents for follow-up. She changed her mind on a muscle flap. She would like to try a wound VAC. We went ahead and ordered this and she has been using it for the past week. She has no issues or complaints today. She denies signs of infection. 5/25; patient presents for follow-up. She has been using the wound VAC and home health is coming in Monday Wednesday Friday to change it. She denies signs of infection. 11/12/2022; Patient was referred to wound care/plastic surgery for evaluation of potential muscle flap to the sacrum. Patient saw Dr. Mardene Speak on 07/28/2022. Unfortunately patient is not a flap candidate due to her multiple comorbidities. Patient has been using Dakin's wet-to-dry dressings. 1/11; patient presents for follow-up. She has been using collagen with Dakin's  wet-to-dry packing. She has no issues or complaints today. 4/9; patient presents for follow-up. She is been using collagen with Dakin's wet-to-dry backing. She has no issues or complaints today. She denies signs of infection. Patient History Information obtained from Patient. Family History Cancer - Father, Diabetes - Father, Heart Disease - Maternal Grandparents, Hypertension - Mother, Thyroid Problems - Mother,Siblings, No family history of Hereditary Spherocytosis, Kidney Disease, Lung Disease, Seizures, Stroke, Tuberculosis. Social History Former smoker - from teens to 66's, Marital Status - Married, Alcohol Use - Rarely - beer, Drug Use - No History, Caffeine Use - Daily - coffee. Medical History Hematologic/Lymphatic Patient has history of Anemia Endocrine Patient has history of Type I Diabetes - s Genitourinary Patient has history of End Stage Renal Disease - off dialysis since transplants Immunological Denies history of Lupus Erythematosus, Raynaudoos, Scleroderma Integumentary (Skin) Denies history of History of Burn Musculoskeletal Patient has history of Osteoarthritis, Osteomyelitis Neurologic Patient has history of Neuropathy Oncologic Patient has history of  Received Radiation Denies history of Received Chemotherapy Hospitalization/Surgery History - infected wound (pressure) 2022. - (R) BKA amp Oct 2021. - UTI 12/22-12/27/2022. Objective Constitutional respirations regular, non-labored and within target range for patient.. Vitals Time Taken: 3:05 PM, Temperature: 98.2 F, Pulse: 70 bpm, Respiratory Rate: 18 breaths/min, Blood Pressure: 170/77 mmHg, Capillary Blood Glucose: 97 mg/dl. Psychiatric pleasant and cooperative. General Notes: Sacral region: open wound with pale hypertrophic granulation tissue present. Undermining circumferentially with increased depth in the center. No signs of surrounding soft tissue infection. ZIHAN, GARST ([id number])  [id number] Page 6 of 8 Integumentary (Hair, Skin) Wound #2 status is Open. Original cause of wound was Pressure Injury. The date acquired was: 10/27/2020. The wound has been in treatment 20 weeks. The wound is located on the Sacrum. The wound measures 2cm length x 2cm width x 0.9cm depth; 3.142cm^2 area and 2.827cm^3 volume. There is Fat Layer (Subcutaneous Tissue) exposed. There is no undermining noted, however, there is tunneling at 1:00 with a maximum distance of 2cm. There is a medium amount of serosanguineous drainage noted. The wound margin is distinct with the outline attached to the wound base. There is large (67-100%) red, pink granulation within the wound bed. There is a small (1-33%) amount of necrotic tissue within the wound bed including Adherent Slough. The periwound skin appearance did not exhibit: Callus, Crepitus, Excoriation, Induration, Rash, Scarring, Dry/Scaly, Maceration, Atrophie Blanche, Cyanosis, Ecchymosis, Hemosiderin Staining, Mottled, Pallor, Rubor, Erythema. Periwound temperature was noted as No Abnormality. The periwound has tenderness on palpation. Wound #2 status is Open. Original cause of wound was Pressure Injury. The date acquired was: 10/27/2020. The wound has been in treatment 20 weeks. The wound is located on the Sacrum. The wound measures 2cm length x 2cm width x 0.9cm depth; 3.142cm^2 area and 2.827cm^3 volume. There is Fat Layer (Subcutaneous Tissue) exposed. There is no undermining noted, however, there is tunneling at 1:00 with a maximum distance of 2cm. There is a medium amount of serosanguineous drainage noted. The wound margin is distinct with the outline attached to the wound base. There is large (67-100%) red, pink, hyper - granulation within the wound bed. There is a small (1-33%) amount of necrotic tissue within the wound bed including Adherent Slough. The periwound skin appearance did not exhibit: Callus, Crepitus,  Excoriation, Induration, Rash, Scarring, Dry/Scaly, Maceration, Atrophie Blanche, Cyanosis, Ecchymosis, Hemosiderin Staining, Mottled, Pallor, Rubor, Erythema. Periwound temperature was noted as No Abnormality. The periwound has tenderness on palpation. Assessment Active Problems ICD-10 Type 1 diabetes mellitus with other skin ulcer Acquired absence of right leg below knee Kidney transplant status Other chronic osteomyelitis, other site Pressure ulcer of sacral region, stage 3 Patient's wound is stable. This is a palliative wound care case. A silver nitrate to the hyper granulated areas. For now I recommended continuing with collagen and Dakin's wet-to-dry backing. Follow-up in 2 months. Patient is to call with any questions or concerns. Procedures Wound #2 Pre-procedure diagnosis of Wound #2 is a Pressure Ulcer located on the Sacrum . There was a Chemical/Enzymatic/Mechanical debridement performed by Geralyn Corwin, DO. With the following instrument(s): silver nitrate after achieving pain control using Lidocaine 5% topical ointment. Other agent used was silver nitrate. A time out was conducted at 15:15, prior to the start of the procedure. There was no bleeding. The procedure was tolerated well with a pain level of 0 throughout and a pain level of 0 following the procedure. Post Debridement Measurements: 2cm length x 2cm width x 0.9cm depth; 2.827cm^3 volume. Post  debridement Stage noted as Category/Stage III. Character of Wound/Ulcer Post Debridement is improved. Post procedure Diagnosis Wound #2: Same as Pre-Procedure General Notes: Scribed for Dr Mikey Bussing by Redmond Pulling, RN. Plan Follow-up Appointments: Other: - 2 months w/ Dr. Mikey Bussing Anesthetic: (In clinic) Topical Lidocaine 4% applied to wound bed Cellular or Tissue Based Products: Cellular or Tissue Based Product Type: - 01/06/23 - will run insurance authorization for a an advance tissue product- theraskin Off-Loading: Low  air-loss mattress (Group 2) - continue to use. Roho cushion for wheelchair - continue using your specialty cushion. Turn and reposition every 2 hours WOUND #2: - Sacrum Wound Laterality: Cleanser: Wound Cleanser (Generic) 1 x Per Day/30 Days Discharge Instructions: Cleanse the wound with wound cleanser prior to applying a clean dressing using gauze sponges, not tissue or cotton balls. Peri-Wound Care: Skin Prep (Generic) 1 x Per Day/30 Days Discharge Instructions: Use skin prep as directed Prim Dressing: Dakin's Solution 0.25%, 16 (oz) 1 x Per Day/30 Days ary Discharge Instructions: Moisten gauze with Dakin's solution packing the collagen. Prim Dressing: Promogran Prisma Matrix, 4.34 (sq in) (silver collagen) 1 x Per Day/30 Days ary Discharge Instructions: Moisten collagen with saline or hydrogel Secondary Dressing: ABD Pad, 5x9 1 x Per Day/30 Days Discharge Instructions: Apply over primary dressing as directed. Secondary Dressing: Woven Gauze Sponge, Non-Sterile 4x4 in (Generic) 1 x Per Day/30 Days Discharge Instructions: Apply over primary dressing as directed. Secured With: Insurance underwriter, Sterile 2x75 (in/in) (Generic) 1 x Per Day/30 Days Discharge Instructions: Secure with stretch gauze as directed. Secured With: 55M Medipore H Soft Cloth Surgical T ape, 4 x 10 (in/yd) (DME) (Generic) 1 x Per Day/30 Days Discharge Instructions: Secure with tape as directed. DARSHA, [name] ([id number]) [id number] Page 7 of 8 1. Silver nitrate 2. Collagen with Dakin's wet-to-dry backing 3. Follow-up in 2 months Electronic Signature(s) Signed: 04/05/2023 3:27:57 PM By: Geralyn Corwin DO Entered By: Geralyn Corwin on 04/05/2023 15:27:07 -------------------------------------------------------------------------------- HxROS Details Patient Name: Date of Service: [name], [name] 04/05/2023 3:00 PM Medical Record Number: [id number] Patient Account Number:  [id number] Date of Birth/Sex: Treating RN: 29-Jan-1961 (62 y.o. F) Primary Care Provider: Abran Duke Other Clinician: Referring Provider: Treating Provider/Extender: Domingo Cocking in Treatment: 20 Information Obtained From Patient Hematologic/Lymphatic Medical History: Positive for: Anemia Endocrine Medical History: Positive for: Type I Diabetes - s Time with diabetes: 56 years Treated with: Insulin Blood sugar tested every day: Yes Tested : constant with device Genitourinary Medical History: Positive for: End Stage Renal Disease - off dialysis since transplants Immunological Medical History: Negative for: Lupus Erythematosus; Raynauds; Scleroderma Integumentary (Skin) Medical History: Negative for: History of Burn Musculoskeletal Medical History: Positive for: Osteoarthritis; Osteomyelitis Neurologic Medical History: Positive for: Neuropathy Oncologic Medical History: Positive for: Received Radiation Negative for: Received Chemotherapy Immunizations Pneumococcal Vaccine: Received Pneumococcal Vaccination: Yes Received Pneumococcal Vaccination On or After 60th Birthday: No Immunization Notes: states being up to date on all - unknown dates for [employer] [place], Nicholos Johns ([id number]) [phone].pdf Page 8 of 8 None Hospitalization / Surgery History Type of Hospitalization/Surgery infected wound (pressure) 2022 (R) 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 2021 UTI 12/22-12/27/2022 Family and Social History Cancer: Yes - Father; Diabetes: Yes - Father; Heart Disease: Yes - Maternal Grandparents; Hereditary Spherocytosis: No; Hypertension: Yes - Mother; Kidney Disease: No; Lung Disease: No; Seizures: No; Stroke: No; Thyroid Problems: Yes - Mother,Siblings; Tuberculosis: No; Former smoker - from teens to 32's; Marital Status - Married; Alcohol Use: Rarely - beer; Drug Use: No  History; Caffeine Use: Daily - coffee;  Financial Concerns: No; Food, Clothing or Shelter Needs: No; Support System Lacking: No; Transportation Concerns: No Electronic Signature(s) Signed: 04/05/2023 3:27:57 PM By: Geralyn CorwinHoffman, Jen Eppinger DO Entered By: Geralyn CorwinHoffman, Chantell Kunkler on 04/05/2023 15:25:21 -------------------------------------------------------------------------------- SuperBill Details Patient Name: Date of Service: [name] ProvostMURPHY, [name] 04/05/2023 Medical Record Number: [id number] Patient Account Number: [id number] Date of Birth/Sex: Treating RN: [birth year] (62 y.o. 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, other site L89.153 Pressure ulcer of sacral region, stage 3 Facility Procedures : CPT4 Code: [id number] Description: [id number] - DEBRIDE W/O ANES NON SELECT Modifier: Quantity: 1 Physician Procedures : CPT4 Code Description Modifier [id number] - WC PHYS LEVEL 3 - EST PT ICD-10 Diagnosis Description L89.153 Pressure ulcer of sacral region, stage 3 M86.68 Other chronic osteomyelitis, other site E10.622 Type 1 diabetes mellitus with other skin ulcer  Z89.511 Acquired absence of right leg below knee Quantity: 1 Electronic Signature(s) Signed: 04/05/2023 3:27:57 PM By: Geralyn CorwinHoffman, Deondray Ospina DO Entered By: Geralyn CorwinHoffman, Ojas Coone on 04/05/2023 15:27:40

## 2023-05-31 ENCOUNTER — Encounter (HOSPITAL_BASED_OUTPATIENT_CLINIC_OR_DEPARTMENT_OTHER): Payer: BC Managed Care – PPO | Attending: Internal Medicine | Admitting: Internal Medicine

## 2023-05-31 DIAGNOSIS — E10622 Type 1 diabetes mellitus with other skin ulcer: Secondary | ICD-10-CM | POA: Insufficient documentation

## 2023-05-31 DIAGNOSIS — L89153 Pressure ulcer of sacral region, stage 3: Secondary | ICD-10-CM | POA: Diagnosis not present

## 2023-05-31 DIAGNOSIS — Z940 Kidney transplant status: Secondary | ICD-10-CM | POA: Diagnosis not present

## 2023-05-31 DIAGNOSIS — M8668 Other chronic osteomyelitis, other site: Secondary | ICD-10-CM | POA: Insufficient documentation

## 2023-05-31 DIAGNOSIS — Z89511 Acquired absence of right leg below knee: Secondary | ICD-10-CM | POA: Diagnosis not present

## 2023-06-28 ENCOUNTER — Ambulatory Visit (HOSPITAL_BASED_OUTPATIENT_CLINIC_OR_DEPARTMENT_OTHER): Payer: BC Managed Care – PPO | Admitting: Internal Medicine

## 2023-07-12 ENCOUNTER — Encounter (HOSPITAL_BASED_OUTPATIENT_CLINIC_OR_DEPARTMENT_OTHER): Payer: BC Managed Care – PPO | Attending: Internal Medicine | Admitting: Internal Medicine

## 2023-07-12 DIAGNOSIS — Z940 Kidney transplant status: Secondary | ICD-10-CM | POA: Diagnosis not present

## 2023-07-12 DIAGNOSIS — Z87891 Personal history of nicotine dependence: Secondary | ICD-10-CM | POA: Insufficient documentation

## 2023-07-12 DIAGNOSIS — E10622 Type 1 diabetes mellitus with other skin ulcer: Secondary | ICD-10-CM | POA: Diagnosis not present

## 2023-07-12 DIAGNOSIS — Z89511 Acquired absence of right leg below knee: Secondary | ICD-10-CM | POA: Insufficient documentation

## 2023-07-12 DIAGNOSIS — L89153 Pressure ulcer of sacral region, stage 3: Secondary | ICD-10-CM | POA: Insufficient documentation

## 2023-07-12 DIAGNOSIS — M8668 Other chronic osteomyelitis, other site: Secondary | ICD-10-CM

## 2023-07-12 DIAGNOSIS — Z923 Personal history of irradiation: Secondary | ICD-10-CM | POA: Insufficient documentation

## 2023-07-12 NOTE — Progress Notes (Signed)
[name] ([id number]) [id number] Page 1 of 8 Visit Report for 07/12/2023 Chief Complaint Document Details Patient Name: Date of Service: [name], [name] 07/12/2023 3:45 PM Medical Record Number: [id number] Patient Account Number: [id number] Date of Birth/Sex: Treating RN: 04-Oct-1961 (62 y.o. F) Primary Care Provider: Abran Duke Other Clinician: Referring Provider: Treating Provider/Extender: Domingo Cocking in Treatment: 20 Information Obtained from: Patient Chief Complaint Sacral ulcer Electronic Signature(s) Signed: 07/12/2023 4:51:26 PM By: Geralyn Corwin DO Entered By: Geralyn Corwin on 07/12/2023 16:47:51 -------------------------------------------------------------------------------- HPI Details Patient Name: Date of Service: [name], [name] 07/12/2023 3:45 PM Medical Record Number: [id number] Patient Account Number: [id number] Date of Birth/Sex: Treating RN: Jun 22, 1961 (62 y.o. F) Primary Care Provider: Abran Duke Other Clinician: Referring Provider: Treating Provider/Extender: Domingo Cocking in Treatment: 31 History of Present Illness HPI Description: Admission 6/24 [name] is a 62 year old female with a past medical history of type 1 diabetes, right BKA, and kidney transplant that presents to the clinic for an 76-month history of sacral ulcer. She has been using wet-to-dry dressings up until 5 weeks ago when she was started on a wound VAC. 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 She has completed 6 weeks of treatment and states she has been discharged by infectious disease. She currently denies any issues to the wound area. She reports minimal pain. She denies signs of infection. 7/11; patient presents for 2-week follow-up. She uses a wound VAC that is changed with home health 3 times a week. She denies any signs  of infection. She has no complaints or issues today. 8/1; patient presents for follow-up. She continues to use a wound VAC and this is changed with home health. She reports pain to the sacrum that started 1 week ago. She denies systemic signs of infection. 8/15; patient presents for 2-week follow-up. She has been using wet-to-dry dressings daily. She reports improvement in the wound size and appearance. She denies infectious symptoms. 9/12; patient presents for follow-up. She has been using wet-to-dry Dakin's moistened gauze. She reports following up with infectious disease and is currently taking amoxicillin. 10/6; patient presents for follow-up. She has been using Dakin's wet-to-dry moistened gauze dressings. She is still taking amoxicillin. She canceled her appointment with infectious disease yesterday because she is tired of going to doctors appointments. She has not picked up gentamicin cream from the pharmacy to use on the wound bed. She currently denies signs of infection. 11/3; patient presents for follow-up. She has been using Dakin's wet-to-dry dressings. She has no issues or complaints today. She denies signs of infection. 12/1; patient presents for follow-up. She continues to use Dakin's wet-to-dry dressings. Patient followed up with Dr. Daiva Eves, ID and amoxicillin was stopped as she had completed her course for sacral osteomyelitis. She reports itching to the periwound. She denies signs of infection. 1/6; patient presents for follow-up. She has been using Dakin's wet-to-dry dressings. She never received the wound VAC. She reports improvement with ketoconazole to the periwound. She has no issues or complaints today. She denies signs of infection. 3/17; patient presents for follow-up. She has been using Dakin's wet-to-dry dressings. She has no issues or complaints today. [name], [name] ([id number]) [id number] Page 2 of 8 3/30; patient presents for follow-up.  She has been using collagen with Dakin's wet-to-dry packing. She has no issues or complaints today. She does state that she would like to try and do a muscle flap to help close the  wound. 4/18; patient presents for follow-up. She changed her mind on a muscle flap. She would like to try a wound VAC. We went ahead and ordered this and she has been using it for the past week. She has no issues or complaints today. She denies signs of infection. 5/25; patient presents for follow-up. She has been using the wound VAC and home health is coming in Monday Wednesday Friday to change it. She denies signs of infection. 11/12/2022; Patient was referred to wound care/plastic surgery for evaluation of potential muscle flap to the sacrum. Patient saw Dr. Mardene Speak on 07/28/2022. Unfortunately patient is not a flap candidate due to her multiple comorbidities. Patient has been using Dakin's wet-to-dry dressings. 1/11; patient presents for follow-up. She has been using collagen with Dakin's wet-to-dry packing. She has no issues or complaints today. 4/9; patient presents for follow-up. She is been using collagen with Dakin's wet-to-dry backing. She has no issues or complaints today. She denies signs of infection. 6/4; 10-month follow-up. Patient has been using collagen with Dakin's wet-to-dry backing. Her husband changes the dressings. She arrived in clinic today with 2 areas of polypoid tissue protruding from the wound. 7/16; patient presents for follow-up. Patient has been using collagen with Dakin's wet-to-dry backing. She has no issues or complaints today. Electronic Signature(s) Signed: 07/12/2023 4:51:26 PM By: Geralyn Corwin DO Entered By: Geralyn Corwin on 07/12/2023 16:48:21 -------------------------------------------------------------------------------- Physical Exam Details Patient Name: Date of Service: [name], [name] 07/12/2023 3:45 PM Medical Record Number: [id number] Patient Account Number:  [id number] Date of Birth/Sex: Treating RN: 1961/03/31 (62 y.o. F) Primary Care Provider: Abran Duke Other Clinician: Referring Provider: Treating Provider/Extender: Domingo Cocking in Treatment: 93 Constitutional respirations regular, non-labored and within target range for patient.Marland Kitchen Psychiatric pleasant and cooperative. Notes Sacral region: open wound with pale granulation tissue present. Undermining circumferentially with increased depth in the center. No signs of surrounding soft tissue infection. No polypoid areas noted as previously seen at last clinic visit. Electronic Signature(s) Signed: 07/12/2023 4:51:26 PM By: Geralyn Corwin DO Entered By: Geralyn Corwin on 07/12/2023 16:49:14 -------------------------------------------------------------------------------- Physician Orders Details Patient Name: Date of Service: [name], [name] 07/12/2023 3:45 PM Medical Record Number: [id number] Patient Account Number: [id number] Date of Birth/Sex: Treating RN: 1961/10/05 (62 y.o. Arta Silence Primary Care Provider: Abran Duke Other Clinician: Referring Provider: Treating Provider/Extender: Domingo Cocking in Treatment: 60 Verbal / Phone Orders: No Diagnosis Coding MARVETTA, VOHS ([id number]) [id number] Page 3 of 8 ICD-10 Coding Code Description E10.622 Type 1 diabetes mellitus with other skin ulcer L89.153 Pressure ulcer of sacral region, stage 3 Z89.511 Acquired absence of right leg below knee Z94.0 Kidney transplant status M86.68 Other chronic osteomyelitis, other site Follow-up Appointments Return appointment in 1 month. - Dr. Mikey Bussing room 8 Tuesday Anesthetic (In clinic) Topical Lidocaine 4% applied to wound bed Cellular or Tissue Based Products Cellular or Tissue Based Product Type: - 01/06/23 - will run insurance authorization for a an advance tissue product-  theraskin Off-Loading Low air-loss mattress (Group 2) - continue to use. Roho cushion for wheelchair - continue using your specialty cushion. Turn and reposition every 2 hours Additional Orders / Instructions Follow Nutritious Diet - increaqse protein- double the normal amount. Juven Shake 1-2 times daily. Wound Treatment Wound #2 - Sacrum Cleanser: Wound Cleanser (Generic) 1 x Per Day/30 Days Discharge Instructions: Cleanse the wound with wound cleanser prior to applying a clean dressing using gauze sponges, not tissue or cotton balls. Peri-Wound Care: Skin Prep (Generic)  1 x Per Day/30 Days Discharge Instructions: Use skin prep as directed Prim Dressing: Dakin's Solution 0.25%, 16 (oz) 1 x Per Day/30 Days ary Discharge Instructions: Moisten gauze with Dakin's solution packing the collagen. Prim Dressing: Promogran Prisma Matrix, 4.34 (sq in) (silver collagen) 1 x Per Day/30 Days ary Discharge Instructions: Moisten collagen with saline or hydrogel Secondary Dressing: ABD Pad, 5x9 1 x Per Day/30 Days Discharge Instructions: Apply over primary dressing as directed. Secondary Dressing: Woven Gauze Sponge, Non-Sterile 4x4 in (Generic) 1 x Per Day/30 Days Discharge Instructions: Apply over primary dressing as directed. Secured With: 49M Medipore H Soft Cloth Surgical T ape, 4 x 10 (in/yd) (Generic) 1 x Per Day/30 Days Discharge Instructions: Secure with tape as directed. Electronic Signature(s) Signed: 07/12/2023 4:51:26 PM By: Geralyn Corwin DO Entered By: Geralyn Corwin on 07/12/2023 16:49:21 -------------------------------------------------------------------------------- Problem List Details Patient Name: Date of Service: CINA, KLUMPP 07/12/2023 3:45 PM Medical Record Number: [id number] Patient Account Number: [id number] Date of Birth/Sex: Treating RN: September 20, 1961 (62 y.o. Arta Silence Primary Care Provider: Abran Duke Other Clinician: Referring Provider: Treating  Provider/Extender: Domingo Cocking in Treatment: [address] [place], Nicholos Johns ([id number]) [id number] Page 4 of 8 ICD-10 Encounter Code Description Active Date MDM Diagnosis E10.622 Type 1 diabetes mellitus with other skin ulcer 11/12/2022 No Yes L89.153 Pressure ulcer of sacral region, stage 3 11/12/2022 No Yes Z89.511 Acquired absence of right leg below knee 11/12/2022 No Yes Z94.0 Kidney transplant status 11/12/2022 No Yes M86.68 Other chronic osteomyelitis, other site 11/12/2022 No Yes Inactive Problems Resolved Problems Electronic Signature(s) Signed: 07/12/2023 4:51:26 PM By: Geralyn Corwin DO Entered By: Geralyn Corwin on 07/12/2023 16:47:40 -------------------------------------------------------------------------------- Progress Note Details Patient Name: Date of Service: [name], [name] 07/12/2023 3:45 PM Medical Record Number: [id number] Patient Account Number: [id number] Date of Birth/Sex: Treating RN: 08-26-1961 (62 y.o. F) Primary Care Provider: Abran Duke Other Clinician: Referring Provider: Treating Provider/Extender: Domingo Cocking in Treatment: 72 Subjective Chief Complaint Information obtained from Patient Sacral ulcer History of Present Illness (HPI) Admission 6/24 Ms. Sequoya Hogsett is a 62 year old female with a past medical history of type 1 diabetes, right BKA, and kidney transplant that presents to the clinic for an 41-month history of sacral ulcer. She has been using wet-to-dry dressings up until 5 weeks ago when she was started on a wound VAC. 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 She has completed 6 weeks of treatment and states she has been discharged by infectious disease. She currently denies any issues to the wound area. She reports minimal pain. She denies signs of infection. 7/11; patient presents  for 2-week follow-up. She uses a wound VAC that is changed with home health 3 times a week. She denies any signs of infection. She has no complaints or issues today. 8/1; patient presents for follow-up. She continues to use a wound VAC and this is changed with home health. She reports pain to the sacrum that started 1 week ago. She denies systemic signs of infection. 8/15; patient presents for 2-week follow-up. She has been using wet-to-dry dressings daily. She reports improvement in the wound size and appearance. She denies infectious symptoms. 9/12; patient presents for follow-up. She has been using wet-to-dry Dakin's moistened gauze. She reports following up with infectious disease and is currently taking amoxicillin. 10/6; patient presents for follow-up. She has been using Dakin's wet-to-dry moistened gauze dressings. She is still taking amoxicillin. She canceled her appointment with  infectious disease yesterday because she is tired of going to doctors appointments. She has not picked up gentamicin cream from the pharmacy to use on the wound bed. She currently denies signs of infection. [name], [name] ([id number]) [id number] Page 5 of 8 11/3; patient presents for follow-up. She has been using Dakin's wet-to-dry dressings. She has no issues or complaints today. She denies signs of infection. 12/1; patient presents for follow-up. She continues to use Dakin's wet-to-dry dressings. Patient followed up with Dr. Daiva Eves, ID and amoxicillin was stopped as she had completed her course for sacral osteomyelitis. She reports itching to the periwound. She denies signs of infection. 1/6; patient presents for follow-up. She has been using Dakin's wet-to-dry dressings. She never received the wound VAC. She reports improvement with ketoconazole to the periwound. She has no issues or complaints today. She denies signs of infection. 3/17; patient presents for follow-up. She has been  using Dakin's wet-to-dry dressings. She has no issues or complaints today. 3/30; patient presents for follow-up. She has been using collagen with Dakin's wet-to-dry packing. She has no issues or complaints today. She does state that she would like to try and do a muscle flap to help close the wound. 4/18; patient presents for follow-up. She changed her mind on a muscle flap. She would like to try a wound VAC. We went ahead and ordered this and she has been using it for the past week. She has no issues or complaints today. She denies signs of infection. 5/25; patient presents for follow-up. She has been using the wound VAC and home health is coming in Monday Wednesday Friday to change it. She denies signs of infection. 11/12/2022; Patient was referred to wound care/plastic surgery for evaluation of potential muscle flap to the sacrum. Patient saw Dr. Mardene Speak on 07/28/2022. Unfortunately patient is not a flap candidate due to her multiple comorbidities. Patient has been using Dakin's wet-to-dry dressings. 1/11; patient presents for follow-up. She has been using collagen with Dakin's wet-to-dry packing. She has no issues or complaints today. 4/9; patient presents for follow-up. She is been using collagen with Dakin's wet-to-dry backing. She has no issues or complaints today. She denies signs of infection. 6/4; 14-month follow-up. Patient has been using collagen with Dakin's wet-to-dry backing. Her husband changes the dressings. She arrived in clinic today with 2 areas of polypoid tissue protruding from the wound. 7/16; patient presents for follow-up. Patient has been using collagen with Dakin's wet-to-dry backing. She has no issues or complaints today. Patient History Information obtained from Patient. Family History Cancer - Father, Diabetes - Father, Heart Disease - Maternal Grandparents, Hypertension - Mother, Thyroid Problems - Mother,Siblings, No family history of Hereditary Spherocytosis, Kidney  Disease, Lung Disease, Seizures, Stroke, Tuberculosis. Social History Former smoker - from teens to 3's, Marital Status - Married, Alcohol Use - Rarely - beer, Drug Use - No History, Caffeine Use - Daily - coffee. Medical History Hematologic/Lymphatic Patient has history of Anemia Endocrine Patient has history of Type I Diabetes - s Genitourinary Patient has history of End Stage Renal Disease - off dialysis since transplants Immunological Denies history of Lupus Erythematosus, Raynauds, Scleroderma Integumentary (Skin) Denies history of History of Burn Musculoskeletal Patient has history of Osteoarthritis, Osteomyelitis Neurologic Patient has history of Neuropathy Oncologic Patient has history of Received Radiation Denies history of Received Chemotherapy Hospitalization/Surgery History - infected wound (pressure) 2022. - (R) BKA amp Oct 2021. - UTI 12/22-12/27/2022. Objective Constitutional respirations regular, non-labored and within target range for patient.. Vitals Time  Taken: 3:45 PM, Temperature: 99 F, Pulse: 67 bpm, Respiratory Rate: 18 breaths/min, Blood Pressure: 122/54 mmHg, Capillary Blood Glucose: 128 mg/dl. Psychiatric pleasant and cooperative. General Notes: Sacral region: open wound with pale granulation tissue present. Undermining circumferentially with increased depth in the center. No signs of surrounding soft tissue infection. No polypoid areas noted as previously seen at last clinic visit. Integumentary (Hair, Skin) [name], [name] ([id number]) [id number] Page 6 of 8 Wound #2 status is Open. Original cause of wound was Pressure Injury. The date acquired was: 10/27/2020. The wound has been in treatment 34 weeks. The wound is located on the Sacrum. The wound measures 2.2cm length x 1cm width x 1.3cm depth; 1.728cm^2 area and 2.246cm^3 volume. There is Fat Layer (Subcutaneous Tissue) exposed. There is no tunneling or undermining noted.  There is a medium amount of serosanguineous drainage noted. The wound margin is distinct with the outline attached to the wound base. There is large (67-100%) red, pink, hyper - granulation within the wound bed. There is a small (1-33%) amount of necrotic tissue within the wound bed including Adherent Slough. The periwound skin appearance did not exhibit: Callus, Crepitus, Excoriation, Induration, Rash, Scarring, Dry/Scaly, Maceration, Atrophie Blanche, Cyanosis, Ecchymosis, Hemosiderin Staining, Mottled, Pallor, Rubor, Erythema. Periwound temperature was noted as No Abnormality. The periwound has tenderness on palpation. Assessment Active Problems ICD-10 Type 1 diabetes mellitus with other skin ulcer Pressure ulcer of sacral region, stage 3 Acquired absence of right leg below knee Kidney transplant status Other chronic osteomyelitis, other site Patient's wound is stable. This is a palliative care case she is not a candidate for muscle flap and she has chronic osteomyelitis already treated with prolonged course of IV antibiotics.. I recommended continuing collagen with Dakin's wet-to-dry backing. Continue aggressive offloading. Follow-up in 1 month. Plan Follow-up Appointments: Return appointment in 1 month. - Dr. Mikey Bussing room 8 Tuesday Anesthetic: (In clinic) Topical Lidocaine 4% applied to wound bed Cellular or Tissue Based Products: Cellular or Tissue Based Product Type: - 01/06/23 - will run insurance authorization for a an advance tissue product- theraskin Off-Loading: Low air-loss mattress (Group 2) - continue to use. Roho cushion for wheelchair - continue using your specialty cushion. Turn and reposition every 2 hours Additional Orders / Instructions: Follow Nutritious Diet - increaqse protein- double the normal amount. Juven Shake 1-2 times daily. WOUND #2: - Sacrum Wound Laterality: Cleanser: Wound Cleanser (Generic) 1 x Per Day/30 Days Discharge Instructions: Cleanse the  wound with wound cleanser prior to applying a clean dressing using gauze sponges, not tissue or cotton balls. Peri-Wound Care: Skin Prep (Generic) 1 x Per Day/30 Days Discharge Instructions: Use skin prep as directed Prim Dressing: Dakin's Solution 0.25%, 16 (oz) 1 x Per Day/30 Days ary Discharge Instructions: Moisten gauze with Dakin's solution packing the collagen. Prim Dressing: Promogran Prisma Matrix, 4.34 (sq in) (silver collagen) 1 x Per Day/30 Days ary Discharge Instructions: Moisten collagen with saline or hydrogel Secondary Dressing: ABD Pad, 5x9 1 x Per Day/30 Days Discharge Instructions: Apply over primary dressing as directed. Secondary Dressing: Woven Gauze Sponge, Non-Sterile 4x4 in (Generic) 1 x Per Day/30 Days Discharge Instructions: Apply over primary dressing as directed. Secured With: 59M Medipore H Soft Cloth Surgical T ape, 4 x 10 (in/yd) (Generic) 1 x Per Day/30 Days Discharge Instructions: Secure with tape as directed. 1. Collagen with Dakin's wet-to-dry backing 2. Aggressive offloading 3. Follow-up in 1 month Electronic Signature(s) Signed: 07/12/2023 4:51:26 PM By: Geralyn Corwin DO Entered By: Geralyn Corwin on 07/12/2023 16:50:29 --------------------------------------------------------------------------------  HxROS Details Patient Name: Date of Service: [name], [name] 07/12/2023 3:45 PM Medical Record Number: [id number] Patient Account Number: [id number] [name], [name] ([id number]) [phone].pdf Page 7 of 8 Date of Birth/Sex: Treating RN: 05-28-1961 (62 y.o. F) Primary Care Provider: Other Clinician: Abran Duke Referring Provider: Treating Provider/Extender: Domingo Cocking in Treatment: 45 Information Obtained From Patient Hematologic/Lymphatic Medical History: Positive for: Anemia Endocrine Medical History: Positive for: Type I Diabetes - s Time with diabetes: 56 years Treated with:  Insulin Blood sugar tested every day: Yes Tested : constant with device Genitourinary Medical History: Positive for: End Stage Renal Disease - off dialysis since transplants Immunological Medical History: Negative for: Lupus Erythematosus; Raynauds; Scleroderma Integumentary (Skin) Medical History: Negative for: History of Burn Musculoskeletal Medical History: Positive for: Osteoarthritis; Osteomyelitis Neurologic Medical History: Positive for: Neuropathy Oncologic Medical History: Positive for: Received Radiation Negative for: Received Chemotherapy Immunizations Pneumococcal Vaccine: Received Pneumococcal Vaccination: Yes Received Pneumococcal Vaccination On or After 60th Birthday: No Immunization Notes: states being up to date on all - unknown dates for immuization Implantable Devices None Hospitalization / Surgery History Type of Hospitalization/Surgery infected wound (pressure) 2022 (R) BKA amp Oct 2021 UTI 12/22-12/27/2022 Family and Social History Cancer: Yes - Father; Diabetes: Yes - Father; Heart Disease: Yes - Maternal Grandparents; Hereditary Spherocytosis: No; Hypertension: Yes - Mother; Kidney Disease: No; Lung Disease: No; Seizures: No; Stroke: No; Thyroid Problems: Yes - Mother,Siblings; Tuberculosis: No; Former smoker - from teens to 79's; Marital Status - Married; Alcohol Use: Rarely - beer; Drug Use: No History; Caffeine Use: Daily - coffee; Financial Concerns: No; Food, Clothing or Shelter Needs: No; Support System Lacking: No; Transportation Concerns: No Electronic Signature(s) [name], [name] ([id number]) [id number] Page 8 of 8 Signed: 07/12/2023 4:51:26 PM By: Geralyn Corwin DO Entered By: Geralyn Corwin on 07/12/2023 16:48:26 -------------------------------------------------------------------------------- SuperBill Details Patient Name: Date of Service: [name], [name] 07/12/2023 Medical Record Number:  [id number] Patient Account Number: [id number] Date of Birth/Sex: Treating RN: 04/24/1961 (62 y.o. Arta Silence Primary Care Provider: Abran Duke Other Clinician: Referring Provider: Treating Provider/Extender: Garey Ham [name]: 34 Diagnosis Coding ICD-10 Codes Code Description E10.622 Type 1 diabetes mellitus with other skin ulcer L89.153 Pressure ulcer of sacral region, stage 3 Z89.511 Acquired absence of right leg below knee Z94.0 Kidney transplant status M86.68 Other chronic osteomyelitis, other site Facility Procedures : CPT4 Code: [id number] Description: 99213 - WOUND CARE VISIT-LEV 3 EST PT Modifier: Quantity: 1 Physician Procedures : CPT4 Code Description Modifier [id number] 99213 - WC PHYS LEVEL 3 - EST PT ICD-10 Diagnosis Description E10.622 Type 1 diabetes mellitus with other skin ulcer L89.153 Pressure ulcer of sacral region, stage 3 M86.68 Other chronic osteomyelitis, other site  Z89.511 Acquired absence of right leg below knee Quantity: 1 Electronic Signature(s) Signed: 07/12/2023 4:51:26 PM By: Geralyn Corwin DO Entered By: Geralyn Corwin on 07/12/2023 16:50:56

## 2023-07-13 NOTE — Progress Notes (Signed)
[name] ([id number]) [id number] Page 1 of 9 Visit Report for 07/12/2023 Arrival Information Details Patient Name: Date of Service: [name], [name] 07/12/2023 3:45 PM Medical Record Number: [id number] Patient Account Number: [id number] Date of Birth/Sex: Treating RN: 1961-01-29 (62 y.o. Gevena Mart Primary Care Fannie Gathright: Abran Duke Other Clinician: Referring Angeliz Settlemyre: Treating Lanette Ell/Extender: Domingo Cocking in Treatment: 38 Visit Information History Since Last Visit All ordered tests and consults were completed: Yes Patient Arrived: Wheel Chair Added or deleted any medications: No Arrival Time: 15:44 Any new allergies or adverse reactions: No Accompanied By: son Had a fall or experienced change in No Transfer Assistance: EasyPivot Patient Lift activities of daily living that may affect Patient Identification Verified: Yes risk of falls: Secondary Verification Process Completed: Yes Signs or symptoms of abuse/neglect since last visito No Patient Requires Transmission-Based Precautions: No Hospitalized since last visit: No Patient Has Alerts: No Implantable device outside of the clinic excluding No cellular tissue based products placed in the center since last visit: Has Dressing in Place as Prescribed: Yes Pain Present Now: No Electronic Signature(s) Signed: 07/13/2023 7:59:22 AM By: Brenton Grills Entered By: Brenton Grills on 07/12/2023 15:45:32 -------------------------------------------------------------------------------- Clinic Level of Care Assessment Details Patient Name: Date of Service: [name] 07/12/2023 3:45 PM Medical Record Number: [id number] Patient Account Number: [id number] Date of Birth/Sex: Treating RN: 1961/08/08 (62 y.o. Arta Silence Primary Care Lanecia Sliva: Abran Duke Other Clinician: Referring Christine Schiefelbein: Treating Mekia Dipinto/Extender: Domingo Cocking in Treatment: 34 Clinic Level of Care Assessment Items TOOL 4 Quantity Score X- 1 0 Use when only an EandM is performed on FOLLOW-UP visit ASSESSMENTS - Nursing Assessment / Reassessment X- 1 10 Reassessment of Co-morbidities (includes updates in patient status) X- 1 5 Reassessment of Adherence to Treatment Plan ASSESSMENTS - Wound and Skin A ssessment / Reassessment X - Simple Wound Assessment / Reassessment - one wound 1 5 []  - 0 Complex Wound Assessment / Reassessment - multiple wounds []  - 0 Dermatologic / Skin Assessment (not related to wound area) ASSESSMENTS - Focused Assessment []  - 0 Circumferential Edema Measurements - multi extremities []  - 0 Nutritional Assessment / Counseling / Intervention THUY, ATILANO ([id number]) [id number] Page 2 of 9 []  - 0 Lower Extremity Assessment (monofilament, tuning fork, pulses) []  - 0 Peripheral Arterial Disease Assessment (using hand held doppler) ASSESSMENTS - Ostomy and/or Continence Assessment and Care []  - 0 Incontinence Assessment and Management []  - 0 Ostomy Care Assessment and Management (repouching, etc.) PROCESS - Coordination of Care X - Simple Patient / Family Education for ongoing care 1 15 []  - 0 Complex (extensive) Patient / Family Education for ongoing care X- 1 10 Staff obtains Chiropractor, Records, T Results / Process Orders est []  - 0 Staff telephones HHA, Nursing Homes / Clarify orders / etc []  - 0 Routine Transfer to another Facility (non-emergent condition) []  - 0 Routine Hospital Admission (non-emergent condition) []  - 0 New Admissions / Manufacturing engineer / Ordering NPWT Apligraf, etc. , []  - 0 Emergency Hospital Admission (emergent condition) X- 1 10 Simple Discharge Coordination []  - 0 Complex (extensive) Discharge Coordination PROCESS - Special Needs []  - 0 Pediatric / Minor Patient Management []  - 0 Isolation Patient Management []  - 0 Hearing  / Language / Visual special needs []  - 0 Assessment of Community assistance (transportation, D/C planning, etc.) []  - 0 Additional assistance / Altered mentation []  - 0 Support Surface(s) Assessment (bed, cushion, seat, etc.) INTERVENTIONS - Wound Cleansing / Measurement  X - Simple Wound Cleansing - one wound 1 5 []  - 0 Complex Wound Cleansing - multiple wounds X- 1 5 Wound Imaging (photographs - any number of wounds) []  - 0 Wound Tracing (instead of photographs) X- 1 5 Simple Wound Measurement - one wound []  - 0 Complex Wound Measurement - multiple wounds INTERVENTIONS - Wound Dressings X - Small Wound Dressing one or multiple wounds 1 10 []  - 0 Medium Wound Dressing one or multiple wounds []  - 0 Large Wound Dressing one or multiple wounds []  - 0 Application of Medications - topical []  - 0 Application of Medications - injection INTERVENTIONS - Miscellaneous []  - 0 External ear exam []  - 0 Specimen Collection (cultures, biopsies, blood, body fluids, etc.) []  - 0 Specimen(s) / Culture(s) sent or taken to Lab for analysis []  - 0 Patient Transfer (multiple staff / Nurse, adult / Similar devices) []  - 0 Simple Staple / Suture removal (25 or less) []  - 0 Complex Staple / Suture removal (26 or more) []  - 0 Hypo / Hyperglycemic Management (close monitor of Blood Glucose) Reading, Nicholos Johns ([id number]) [id number] Page 3 of 9 []  - 0 Ankle / Brachial Index (ABI) - do not check if billed separately X- 1 5 Vital Signs Has the patient been seen at the hospital within the last three years: Yes Total Score: 85 Level Of Care: New/Established - Level 3 Electronic Signature(s) Signed: 07/12/2023 6:16:33 PM By: Shawn Stall RN, BSN Entered By: Shawn Stall on 07/12/2023 16:11:17 -------------------------------------------------------------------------------- Encounter Discharge Information Details Patient Name: Date of Service: [name]  07/12/2023 3:45 PM Medical Record Number: [id number] Patient Account Number: [id number] Date of Birth/Sex: Treating RN: [birth year] (62 y.o. Arta Silence Primary Care Yavuz Kirby: Abran Duke Other Clinician: Referring Honor Frison: Treating Sair Faulcon/Extender: Domingo Cocking in Treatment: 55 Encounter Discharge Information Items Discharge Condition: Stable Ambulatory Status: Wheelchair Discharge Destination: Home Transportation: Private Auto Accompanied By: husband Schedule Follow-up Appointment: Yes Clinical Summary of Care: Electronic Signature(s) Signed: 07/12/2023 6:16:33 PM By: Shawn Stall RN, BSN Entered By: Shawn Stall on 07/12/2023 16:11:43 -------------------------------------------------------------------------------- Lower Extremity Assessment Details Patient Name: Date of Service: RHAYA, COALE 07/12/2023 3:45 PM Medical Record Number: [id number] Patient Account Number: [id number] Date of Birth/Sex: Treating RN: January 05, 1961 (62 y.o. Gevena Mart Primary Care Beauden Tremont: Abran Duke Other Clinician: Referring Amylah Will: Treating Kamali Sakata/Extender: Garey Ham [name]: 34 Electronic Signature(s) Signed: 07/13/2023 7:59:22 AM By: Brenton Grills Entered By: Brenton Grills on 07/12/2023 15:48:44 -------------------------------------------------------------------------------- Multi Wound Chart Details Patient Name: Date of Service: LETITIA, SABALA 07/12/2023 3:45 PM Gala Murdoch ([id number]) [id number] Page 4 of 9 Medical Record Number: [id number] Patient Account Number: [id number] Date of Birth/Sex: Treating RN: [birth year] (62 y.o. F) Primary Care Gerrard Crystal: Abran Duke Other Clinician: Referring Merril Nagy: Treating Eleanor Dimichele/Extender: Domingo Cocking in Treatment: 34 Vital Signs Height(in): Capillary Blood Glucose(mg/dl):  220 Weight(lbs): Pulse(bpm): 67 Body Mass Index(BMI): Blood Pressure(mmHg): 122/54 Temperature(F): 99 Respiratory Rate(breaths/min): 18 [2:Photos:] [N/A:N/A] Sacrum N/A N/A Wound Location: Pressure Injury N/A N/A Wounding Event: Pressure Ulcer N/A N/A Primary Etiology: Anemia, Type I Diabetes, End Stage N/A N/A Comorbid History: Renal Disease, Osteoarthritis, Osteomyelitis, Neuropathy, Received Radiation 10/27/2020 N/A N/A Date Acquired: 8 N/A N/A Weeks of Treatment: Open N/A N/A Wound Status: No N/A N/A Wound Recurrence: 2.2x1x1.3 N/A N/A Measurements L x W x D (cm) 1.728 N/A N/A A (cm) : rea 2.246 N/A N/A Volume (cm) : 63.30% N/A N/A % Reduction in A rea: 68.20% N/A N/A %  Reduction in Volume: Category/Stage III N/A N/A Classification: Medium N/A N/A Exudate A mount: Serosanguineous N/A N/A Exudate Type: red, brown N/A N/A Exudate Color: Distinct, outline attached N/A N/A Wound Margin: Large (67-100%) N/A N/A Granulation A mount: Red, Pink, Hyper-granulation N/A N/A Granulation Quality: Small (1-33%) N/A N/A Necrotic A mount: Fat Layer (Subcutaneous Tissue): Yes N/A N/A Exposed Structures: Fascia: No Tendon: No Muscle: No Joint: No Bone: No Small (1-33%) N/A N/A Epithelialization: Excoriation: No N/A N/A Periwound Skin Texture: Induration: No Callus: No Crepitus: No Rash: No Scarring: No Maceration: No N/A N/A Periwound Skin Moisture: Dry/Scaly: No Atrophie Blanche: No N/A N/A Periwound Skin Color: Cyanosis: No Ecchymosis: No Erythema: No Hemosiderin Staining: No Mottled: No Pallor: No Rubor: No No Abnormality N/A N/A Temperature: Yes N/A N/A Tenderness on Palpation: Treatment Notes Wound #2 (Sacrum) Cleanser Wound Cleanser Discharge Instruction: Cleanse the wound with wound cleanser prior to applying a clean dressing using gauze sponges, not tissue or cotton balls. ALIMA, NASER ([id number])  [id number] Page 5 of 9 Peri-Wound Care Skin Prep Discharge Instruction: Use skin prep as directed Topical Primary Dressing Dakin's Solution 0.25%, 16 (oz) Discharge Instruction: Moisten gauze with Dakin's solution packing the collagen. Promogran Prisma Matrix, 4.34 (sq in) (silver collagen) Discharge Instruction: Moisten collagen with saline or hydrogel Secondary Dressing ABD Pad, 5x9 Discharge Instruction: Apply over primary dressing as directed. Woven Gauze Sponge, Non-Sterile 4x4 in Discharge Instruction: Apply over primary dressing as directed. Secured With 41M Medipore H Soft Cloth Surgical T ape, 4 x 10 (in/yd) Discharge Instruction: Secure with tape as directed. Compression Wrap Compression Stockings Add-Ons Electronic Signature(s) Signed: 07/12/2023 4:51:26 PM By: Geralyn Corwin DO Entered By: Geralyn Corwin on 07/12/2023 16:47:45 -------------------------------------------------------------------------------- Multi-Disciplinary Care Plan Details Patient Name: Date of Service: LIALA, CODISPOTI 07/12/2023 3:45 PM Medical Record Number: [id number] Patient Account Number: [id number] Date of Birth/Sex: Treating RN: 02/27/1961 (62 y.o. Arta Silence Primary Care Brittley Regner: Abran Duke Other Clinician: Referring Yamila Cragin: Treating Bryttney Netzer/Extender: Domingo Cocking in Treatment: 43 Active Inactive Nutrition Nursing Diagnoses: Potential for alteratiion in Nutrition/Potential for imbalanced nutrition Goals: Patient/caregiver agrees to and verbalizes understanding of need to obtain nutritional consultation Date Initiated: 11/12/2022 Target Resolution Date: 08/26/2023 Goal Status: Active Interventions: Provide education on nutrition Treatment Activities: Education provided on Nutrition : 11/12/2022 Patient referred to Primary Care Physician for further nutritional evaluation :  11/12/2022 Notes: Pressure CALINDA, STOCKINGER ([id number]) [phone].pdf Page 6 of 9 Nursing Diagnoses: Potential for impaired tissue integrity related to pressure, friction, moisture, and shear Goals: Patient/caregiver will verbalize risk factors for pressure ulcer development Date Initiated: 11/12/2022 Target Resolution Date: 08/26/2023 Goal Status: Active Patient/caregiver will verbalize understanding of pressure ulcer management Date Initiated: 11/12/2022 Target Resolution Date: 08/26/2023 Goal Status: Active Interventions: Assess: immobility, friction, shearing, incontinence upon admission and as needed Assess potential for pressure ulcer upon admission and as needed Provide education on pressure ulcers Treatment Activities: Patient referred for pressure reduction/relief devices : 11/12/2022 Notes: Electronic Signature(s) Signed: 07/12/2023 6:16:33 PM By: Shawn Stall RN, BSN Entered By: Shawn Stall on 07/12/2023 16:10:35 -------------------------------------------------------------------------------- Pain Assessment Details Patient Name: Date of Service: RYANNE, MORAND 07/12/2023 3:45 PM Medical Record Number: [id number] Patient Account Number: [id number] Date of Birth/Sex: Treating RN: April 23, 1961 (62 y.o. Gevena Mart Primary Care Deidra Spease: Abran Duke Other Clinician: Referring Michai Dieppa: Treating Avett Reineck/Extender: Domingo Cocking in Treatment: 62 Active Problems Location of Pain Severity and Description of Pain Patient Has Paino No Site Locations Pain Management and Medication Current Pain Management:  Electronic Signature(s) Signed: 07/13/2023 7:59:22 AM By: Brenton Grills Entered By: Brenton Grills on 07/12/2023 15:47:27 Gala Murdoch ([id number]) [id number] Page 7 of 9 -------------------------------------------------------------------------------- Patient/Caregiver Education  Details Patient Name: Date of Service: CAMELLE, HENKELS 7/16/2024andnbsp3:45 PM Medical Record Number: [id number] Patient Account Number: [id number] Date of Birth/Gender: Treating RN: [birth year] (62 y.o. Arta Silence Primary Care Physician: Abran Duke Other Clinician: Referring Physician: Treating Physician/Extender: Domingo Cocking in Treatment: 51 Education Assessment Education Provided To: Patient Education Topics Provided Wound/Skin Impairment: Handouts: Caring for Your Ulcer Methods: Explain/Verbal Responses: Reinforcements needed Electronic Signature(s) Signed: 07/12/2023 6:16:33 PM By: Shawn Stall RN, BSN Entered By: Shawn Stall on 07/12/2023 16:10:47 -------------------------------------------------------------------------------- Wound Assessment Details Patient Name: Date of Service: TAILA, BASINSKI 07/12/2023 3:45 PM Medical Record Number: [id number] Patient Account Number: [id number] Date of Birth/Sex: Treating RN: 1961/04/14 (62 y.o. Gevena Mart Primary Care Ione [place]: Abran Duke Other Clinician: Referring Londyn Wotton: Treating Braylie Badami/Extender: Garey Ham [name]: 34 Wound Status Wound Number: 2 Primary Pressure Ulcer Etiology: Wound Location: Sacrum Wound Open Wounding Event: Pressure Injury Status: Date Acquired: 10/27/2020 Comorbid Anemia, Type I Diabetes, End Stage Renal Disease, Osteoarthritis, Weeks Of Treatment: 34 History: Osteomyelitis, Neuropathy, Received Radiation Clustered Wound: No Photos Wound Measurements MARGARETTE, VANNATTER ([id number]) Length: (cm) 2.2 Width: (cm) 1 Depth: (cm) 1.3 Area: (cm) 1.728 Volume: (cm) 2.246 [id number] Page 8 of 9 % Reduction in Area: 63.3% % Reduction in Volume: 68.2% Epithelialization: Small (1-33%) Tunneling: No Undermining: No Wound Description Classification: Category/Stage III Wound Margin:  Distinct, outline attached Exudate Amount: Medium Exudate Type: Serosanguineous Exudate Color: red, brown Foul Odor After Cleansing: No Slough/Fibrino Yes Wound Bed Granulation Amount: Large (67-100%) Exposed Structure Granulation Quality: Red, Pink, Hyper-granulation Fascia Exposed: No Necrotic Amount: Small (1-33%) Fat Layer (Subcutaneous Tissue) Exposed: Yes Necrotic Quality: Adherent Slough Tendon Exposed: No Muscle Exposed: No Joint Exposed: No Bone Exposed: No Periwound Skin Texture Texture Color No Abnormalities Noted: No No Abnormalities Noted: No Callus: No Atrophie Blanche: No Crepitus: No Cyanosis: No Excoriation: No Ecchymosis: No Induration: No Erythema: No Rash: No Hemosiderin Staining: No Scarring: No Mottled: No Pallor: No Moisture Rubor: No No Abnormalities Noted: No Dry / Scaly: No Temperature / Pain Maceration: No Temperature: No Abnormality Tenderness on Palpation: Yes Treatment Notes Wound #2 (Sacrum) Cleanser Wound Cleanser Discharge Instruction: Cleanse the wound with wound cleanser prior to applying a clean dressing using gauze sponges, not tissue or cotton balls. Peri-Wound Care Skin Prep Discharge Instruction: Use skin prep as directed Topical Primary Dressing Dakin's Solution 0.25%, 16 (oz) Discharge Instruction: Moisten gauze with Dakin's solution packing the collagen. Promogran Prisma Matrix, 4.34 (sq in) (silver collagen) Discharge Instruction: Moisten collagen with saline or hydrogel Secondary Dressing ABD Pad, 5x9 Discharge Instruction: Apply over primary dressing as directed. Woven Gauze Sponge, Non-Sterile 4x4 in Discharge Instruction: Apply over primary dressing as directed. Secured With 38M Medipore H Soft Cloth Surgical T ape, 4 x 10 (in/yd) Discharge Instruction: Secure with tape as directed. Compression Wrap Compression Stockings Add-Ons Electronic Signature(s) KIMORI, TARTAGLIA ([id number])  [id number] Page 9 of 9 Signed: 07/13/2023 7:59:22 AM By: Brenton Grills Entered By: Brenton Grills on 07/12/2023 15:53:47 -------------------------------------------------------------------------------- Vitals Details Patient Name: Date of Service: AJAH, VANHOOSE 07/12/2023 3:45 PM Medical Record Number: [id number] Patient Account Number: [id number] Date of Birth/Sex: Treating RN: [birth year] (62 y.o. Gevena Mart Primary Care Blonnie Maske: Abran Duke Other Clinician: Referring Celisse Ciulla: Treating Kammi Hechler/Extender: Garey Ham [name]: 10 Vital Signs Time Taken: 15:45  Temperature (F): 99 Pulse (bpm): 67 Respiratory Rate (breaths/min): 18 Blood Pressure (mmHg): 122/54 Capillary Blood Glucose (mg/dl): 161 Reference Range: 80 - 120 mg / dl Electronic Signature(s) Signed: 07/13/2023 7:59:22 AM By: Brenton Grills Entered By: Brenton Grills on 07/12/2023 15:47:20

## 2023-07-31 ENCOUNTER — Emergency Department (HOSPITAL_COMMUNITY)
Admission: EM | Admit: 2023-07-31 | Discharge: 2023-07-31 | Disposition: A | Discharge status: Home / Self Care | Payer: BC Managed Care – PPO | Attending: Emergency Medicine | Admitting: Emergency Medicine

## 2023-07-31 ENCOUNTER — Encounter (HOSPITAL_COMMUNITY): Payer: Self-pay

## 2023-07-31 ENCOUNTER — Other Ambulatory Visit: Payer: Self-pay

## 2023-07-31 DIAGNOSIS — Z79899 Other long term (current) drug therapy: Secondary | ICD-10-CM | POA: Insufficient documentation

## 2023-07-31 DIAGNOSIS — N3000 Acute cystitis without hematuria: Secondary | ICD-10-CM

## 2023-07-31 DIAGNOSIS — I10 Essential (primary) hypertension: Secondary | ICD-10-CM | POA: Insufficient documentation

## 2023-07-31 DIAGNOSIS — T839XXA Unspecified complication of genitourinary prosthetic device, implant and graft, initial encounter: Secondary | ICD-10-CM

## 2023-07-31 DIAGNOSIS — T83091A Other mechanical complication of indwelling urethral catheter, initial encounter: Secondary | ICD-10-CM | POA: Diagnosis not present

## 2023-07-31 DIAGNOSIS — E119 Type 2 diabetes mellitus without complications: Secondary | ICD-10-CM | POA: Diagnosis not present

## 2023-07-31 DIAGNOSIS — E039 Hypothyroidism, unspecified: Secondary | ICD-10-CM | POA: Insufficient documentation

## 2023-07-31 LAB — URINALYSIS, ROUTINE W REFLEX MICROSCOPIC
Bilirubin Urine: NEGATIVE
Glucose, UA: 50 mg/dL — AB
Hgb urine dipstick: NEGATIVE
Ketones, ur: NEGATIVE mg/dL
Nitrite: NEGATIVE
Protein, ur: 100 mg/dL — AB
Specific Gravity, Urine: 1.013 (ref 1.005–1.030)
pH: 9 — ABNORMAL HIGH (ref 5.0–8.0)

## 2023-07-31 MED ORDER — CEPHALEXIN 500 MG PO CAPS: 500.0000 mg | ORAL_CAPSULE | Freq: Three times a day (TID) | ORAL | 0 refills | Status: DC

## 2023-07-31 MED ORDER — CEPHALEXIN 500 MG PO CAPS
500.0000 mg | ORAL_CAPSULE | Freq: Once | ORAL | Status: AC
  Administered 2023-07-31: 500 mg via ORAL
  Filled 2023-07-31: qty 1

## 2023-07-31 NOTE — ED Provider Notes (Signed)
[place] [facility]ERGENCY DEPARTMENT AT [facility] Provider Note   CSN: [id number] Arrival date & time: 07/31/23  2024     History  No chief complaint on file.   [name] is a 62 y.o. female.  Pt is a 62 yo with pmhx significant for anemia, dm, hypothyroidism, ddd, osteomyelitis, htn, hld, gerd, urinary retention and hx bka RLE.  Pt has an indwelling foley catheter.  She said it is clogged.  Husband tried to flush it, but it did not flush.  Foley changed on Monday, 7/29.  Pt feels like she has to urinate.       Home Medications Prior to Admission medications   Medication Sig Start Date End Date Taking? Authorizing Provider  azaTHIOprine (IMURAN) 50 MG tablet Take 1 tablet (50 mg total) by mouth daily. 03/02/21  Yes Alwyn Ren, MD  cephALEXin (KEFLEX) 500 MG capsule Take 1 capsule (500 mg total) by mouth 3 (three) times daily. 07/31/23  Yes Jacalyn Lefevre, MD  cycloSPORINE modified (NEORAL) 25 MG capsule Take 75 mg by mouth 2 (two) times daily. 12/13/21  Yes [provider]  levothyroxine (SYNTHROID) 88 MCG tablet Take 1 tablet (88 mcg total) by mouth daily before breakfast. 03/02/21  Yes Alwyn Ren, MD  lisinopril (ZESTRIL) 5 MG tablet Take 2.5 mg by mouth at bedtime. 12/19/19  Yes [provider]  Multiple Vitamin (MULTIVITAMIN WITH MINERALS) TABS tablet Take 1 tablet by mouth daily.   Yes [provider]  NOVOLOG 100 UNIT/ML injection OMNIPOD PUMP. BASAL 12A 0.4, 9A 0.65. I:C 16. ISF 88. TOTAL: 40 UNITS/DAY.   Yes [provider]  acetaminophen (TYLENOL) 325 MG tablet Take 2 tablets (650 mg total) by mouth every 6 (six) hours as needed for mild pain (or Fever >/= 101). Patient not taking: Reported on 07/31/2023 10/06/20   Rhetta Mura, MD  ascorbic acid (VITAMIN C) 250 MG tablet Take 1 tablet (250 mg total) by mouth 2 (two) times daily. Patient not taking: Reported on 07/31/2023 12/22/21   Rodolph Bong, MD   atorvastatin (LIPITOR) 10 MG tablet Take 1 tablet (10 mg total) by mouth daily. Patient not taking: Reported on 07/31/2023 03/02/21   Alwyn Ren, MD  cholestyramine [place]) 4 g packet Take 1 packet (4 g total) by mouth 2 (two) times daily. Patient not taking: Reported on 07/31/2023 03/02/21   Alwyn Ren, MD  collagenase (SANTYL) ointment Apply topically daily. Patient not taking: Reported on 07/31/2023 03/02/21   Alwyn Ren, MD  HYDROcodone bit-homatropine [facility]) 5-1.5 MG/5ML syrup Take 5 mLs by mouth every 6 (six) hours as needed for cough. Patient not taking: Reported on 07/31/2023 12/22/21   Rodolph Bong, MD  insulin lispro (HUMALOG) 100 UNIT/ML injection Inject 0.02 mLs (2 Units total) into the skin 3 (three) times daily before meals. Sliding scale if 200-250=2 units;251-300=4units;301-350=6 units;351-400=8 units;104-450=10 units;451-500=12 units subcutaneously before meals and at bedtime for DM Patient not taking: Reported on 07/31/2023 03/02/21   Alwyn Ren, MD  liver oil-zinc oxide (DESITIN) 40 % ointment Apply topically as needed for irritation. Patient not taking: Reported on 07/31/2023 03/02/21   Alwyn Ren, MD  loperamide (IMODIUM) 2 MG capsule Take 1 capsule (2 mg total) by mouth as needed for diarrhea or loose stools. Patient not taking: Reported on 07/31/2023 03/02/21   Alwyn Ren, MD  ondansetron (ZOFRAN) 4 MG tablet Take 1 tablet (4 mg total) by mouth every 6 (six) hours as needed  for nausea or vomiting. Patient not taking: Reported on 07/31/2023 06/26/22   Al Decant, PA-C  ondansetron (ZOFRAN-ODT) 4 MG disintegrating tablet Take 1 tablet (4 mg total) by mouth every 4 (four) hours as needed for nausea or vomiting. Patient not taking: Reported on 07/31/2023 03/02/21   Alwyn Ren, MD  oxyCODONE (OXY IR/ROXICODONE) 5 MG immediate release tablet Take 1 tablet (5 mg total) by mouth every 6 (six) hours as needed for severe  pain. Patient not taking: Reported on 07/31/2023 03/02/21   Alwyn Ren, MD  pantoprazole (PROTONIX) 40 MG tablet Take 1 tablet (40 mg total) by mouth 2 (two) times daily. Take 1 tablet twice daily x1 month, then 1 tablet daily thereafter. Patient not taking: Reported on 07/31/2023 12/22/21   Rodolph Bong, MD  predniSONE (DELTASONE) 5 MG tablet Take 1 tablet (5 mg total) by mouth daily with breakfast. Patient not taking: Reported on 07/31/2023 03/02/21   Alwyn Ren, MD  saccharomyces boulardii (FLORASTOR) 250 MG capsule Take 1 capsule (250 mg total) by mouth 2 (two) times daily. Patient not taking: Reported on 12/17/2021 03/02/21   Alwyn Ren, MD  sucralfate (CARAFATE) 1 GM/10ML suspension Take 10 mLs (1 g total) by mouth 4 (four) times daily -  with meals and at bedtime. 12/22/21 01/21/22  Rodolph Bong, MD  triamcinolone (NASACORT) 55 MCG/ACT AERO nasal inhaler USE 1 SPRAY IN [facility] NOSTRIL Patient not taking: Reported on 12/17/2021 07/13/21   Drema Halon, MD      Allergies    Patient has no known allergies.    Review of Systems   Review of Systems  All other systems reviewed and are negative.   Physical Exam Updated Vital Signs BP 107/62   Pulse 84   Temp 98.3 F (36.8 C) (Oral)   Resp 16   SpO2 96%  Physical Exam Vitals and nursing note reviewed.  Constitutional:      Appearance: Normal appearance.  HENT:     Head: Normocephalic and atraumatic.     Right Ear: External ear normal.     Left Ear: External ear normal.     Nose: Nose normal.     Mouth/Throat:     Mouth: Mucous membranes are moist.     Pharynx: Oropharynx is clear.  Eyes:     Extraocular Movements: Extraocular movements intact.     Comments: Cataract left eye  Cardiovascular:     Rate and Rhythm: Normal rate and regular rhythm.     Pulses: Normal pulses.     Heart sounds: Normal heart sounds.  Pulmonary:     Effort: Pulmonary effort is normal.     Breath sounds: Normal  breath sounds.  Abdominal:     General: Abdomen is flat. Bowel sounds are normal.     Palpations: Abdomen is soft.  Musculoskeletal:        General: Normal range of motion.     Cervical back: Normal range of motion and neck supple.     Comments: Right bka  Skin:    General: Skin is warm.     Capillary Refill: Capillary refill takes less than 2 seconds.  Neurological:     General: No focal deficit present.     Mental Status: She is alert and oriented to person, place, and time.  Psychiatric:        Mood and Affect: Mood normal.        Behavior: Behavior normal.     ED  Results / Procedures / Treatments   Labs (all labs ordered are listed, but only abnormal results are displayed) Labs Reviewed  URINALYSIS, ROUTINE W REFLEX MICROSCOPIC - Abnormal; Notable for the following components:      Result Value   Color, Urine YELLOW (*)    APPearance CLOUDY (*)    pH 9.0 (*)    Glucose, UA 50 (*)    Protein, ur 100 (*)    Leukocytes,Ua LARGE (*)    Bacteria, UA FEW (*)    All other components within normal limits  URINE CULTURE    EKG None  Radiology No results found.  Procedures Procedures    Medications Ordered in ED Medications  cephALEXin (KEFLEX) capsule 500 mg (has no administration in time range)    ED Course/ Medical Decision Making/ A&P                                 Medical Decision Making Amount and/or Complexity of Data Reviewed Labs: ordered.  Risk Prescription drug management.   This patient presents to the ED for concern of foley clogged, this involves an extensive number of treatment options, and is a complaint that carries with it a high risk of complications and morbidity.  The differential diagnosis includes uti, clogged foley   Co morbidities that complicate the patient evaluation  anemia, dm, hypothyroidism, ddd, osteomyelitis, htn, hld, gerd, urinary retention and hx bka RLE   Additional history obtained:  Additional history obtained  from epic chart review External records from outside source obtained and reviewed including husband   Lab Tests:  I Ordered, and personally interpreted labs.  The pertinent results include:  ua + uti   Medicines ordered and prescription drug management:  I ordered medication including keflex  for uti  Reevaluation of the patient after these medicines showed that the patient stayed the same I have reviewed the patients home medicines and have made adjustments as needed    Problem List / ED Course:  Urinary retention:  foley would not flush, so it was replaced with a 3F foley. UTI:  pt started on keflex   Reevaluation:  After the interventions noted above, I reevaluated the patient and found that they have :improved   Social Determinants of Health:  Lives at home   Dispostion:  After consideration of the diagnostic results and the patients response to treatment, I feel that the patent would benefit from discharge with outpatient f/u.          Final Clinical Impression(s) / ED Diagnoses Final diagnoses:  Foley catheter problem, initial encounter (HCC)  Acute cystitis without hematuria    Rx / DC Orders ED Discharge Orders          Ordered    cephALEXin (KEFLEX) 500 MG capsule  3 times daily        07/31/23 2255              Jacalyn Lefevre, MD 07/31/23 2301

## 2023-07-31 NOTE — ED Triage Notes (Addendum)
Pt. Arrives POV for a clogged catheter. Pt.'s visitor states that he tried to unclog it with sterile water, but was un successful. Pt. Denies abdominal pain, but states that she feels like she has to urine.

## 2023-08-04 ENCOUNTER — Telehealth (HOSPITAL_BASED_OUTPATIENT_CLINIC_OR_DEPARTMENT_OTHER): Payer: Self-pay | Admitting: *Deleted

## 2023-08-04 NOTE — Telephone Encounter (Signed)
Post ED Visit - Positive Culture Follow-up  Culture report reviewed by antimicrobial stewardship pharmacist: [facility] [x]  Nicole Kindred Pharm.D. []  Celedonio Miyamoto, Pharm.D., BCPS AQ-ID []  Garvin Fila, Pharm.D., BCPS []  Georgina Pillion, [address].D., BCPS []  [place], [place].D., BCPS, AAHIVP []  Estella Husk, Pharm.D., BCPS, AAHIVP []  Lysle Pearl, PharmD, BCPS []  Phillips Climes, PharmD, BCPS []  Agapito Games, PharmD, BCPS []  Verlan Friends, PharmD []  Mervyn Gay, PharmD, BCPS []  Vinnie Level, PharmD  [facility] []  Len Childs, PharmD []  Greer Pickerel, PharmD []  Adalberto Cole, PharmD []  Perlie Gold, Rph []  Lonell Face) Jean Rosenthal, PharmD []  Earl Many, PharmD []  Junita Push, PharmD []  Dorna Leitz, PharmD []  Terrilee Files, PharmD []  Lynann Beaver, PharmD []  Keturah Barre, PharmD []  Loralee Pacas, PharmD []  Bernadene Person, PharmD   Positive urine culture Treated with Cephalexin, organism sensitive to the same and no further patient follow-up is required at this time.  Virl Axe [facility] 08/04/2023, 8:57 AM

## 2023-08-11 ENCOUNTER — Encounter (HOSPITAL_BASED_OUTPATIENT_CLINIC_OR_DEPARTMENT_OTHER): Payer: BC Managed Care – PPO | Attending: Internal Medicine | Admitting: Internal Medicine

## 2023-08-11 DIAGNOSIS — N186 End stage renal disease: Secondary | ICD-10-CM | POA: Diagnosis not present

## 2023-08-11 DIAGNOSIS — M8668 Other chronic osteomyelitis, other site: Secondary | ICD-10-CM | POA: Diagnosis not present

## 2023-08-11 DIAGNOSIS — Z940 Kidney transplant status: Secondary | ICD-10-CM | POA: Diagnosis not present

## 2023-08-11 DIAGNOSIS — L89153 Pressure ulcer of sacral region, stage 3: Secondary | ICD-10-CM | POA: Insufficient documentation

## 2023-08-11 DIAGNOSIS — E10622 Type 1 diabetes mellitus with other skin ulcer: Secondary | ICD-10-CM

## 2023-08-11 DIAGNOSIS — Z89511 Acquired absence of right leg below knee: Secondary | ICD-10-CM | POA: Insufficient documentation

## 2023-08-12 NOTE — Progress Notes (Addendum)
[name], [name] ([id number]) [id number] Page 1 of 9 Visit Report for 08/11/2023 Arrival Information Details Patient Name: Date of Service: [name], [name] 08/11/2023 3:45 PM Medical Record Number: [id number] Patient Account Number: [id number] Date of Birth/Sex: Treating RN: [birth year] (62 y.o. F) Primary Care Hyden Soley: Abran Duke Other Clinician: Referring Esha Fincher: Treating Lenox Ladouceur/Extender: Domingo Cocking in Treatment: 22 Visit Information History Since Last Visit Added or deleted any medications: No Patient Arrived: Wheel Chair Any new allergies or adverse reactions: No Arrival Time: 16:13 Had a fall or experienced change in No Accompanied By: husband activities of daily living that may affect Transfer Assistance: None risk of falls: Patient Identification Verified: Yes Signs or symptoms of abuse/neglect since last visito No Secondary Verification Process Completed: Yes Hospitalized since last visit: No Patient Requires Transmission-Based Precautions: No Implantable device outside of the clinic excluding No Patient Has Alerts: No cellular tissue based products placed in the center since last visit: Has Dressing in Place as Prescribed: Yes Has Compression in Place as Prescribed: No Pain Present Now: No Electronic Signature(s) Signed: 08/12/2023 12:35:38 PM By: Thayer Dallas Entered By: Thayer Dallas on 08/11/2023 13:13:49 -------------------------------------------------------------------------------- Clinic Level of Care Assessment Details Patient Name: Date of Service: [name], [name] 08/11/2023 3:45 PM Medical Record Number: [id number] Patient Account Number: [id number] Date of Birth/Sex: Treating RN: Sep 06, 1961 (62 y.o. Arta Silence Silence Primary Care Julien Oscar: Abran Duke Other Clinician: Referring Treylen Gibbs: Treating Printice Hellmer/Extender: Domingo Cocking in Treatment: 109 Clinic  Level of Care Assessment Items TOOL 4 Quantity Score X- 1 0 Use when only an EandM is performed on FOLLOW-UP visit ASSESSMENTS - Nursing Assessment / Reassessment X- 1 10 Reassessment of Co-morbidities (includes updates in patient status) X- 1 5 Reassessment of Adherence to Treatment Plan ASSESSMENTS - Wound and Skin A ssessment / Reassessment X - Simple Wound Assessment / Reassessment - one wound 1 5 []  - 0 Complex Wound Assessment / Reassessment - multiple wounds []  - 0 Dermatologic / Skin Assessment (not related to wound area) ASSESSMENTS - Focused Assessment []  - 0 Circumferential Edema Measurements - multi extremities []  - 0 Nutritional Assessment / Counseling / Intervention [name], [name] ([id number]) [id number] Page 2 of 9 []  - 0 Lower Extremity Assessment (monofilament, tuning fork, pulses) []  - 0 Peripheral Arterial Disease Assessment (using hand held doppler) ASSESSMENTS - Ostomy and/or Continence Assessment and Care []  - 0 Incontinence Assessment and Management []  - 0 Ostomy Care Assessment and Management (repouching, etc.) PROCESS - Coordination of Care X - Simple Patient / Family Education for ongoing care 1 15 []  - 0 Complex (extensive) Patient / Family Education for ongoing care X- 1 10 Staff obtains Chiropractor, Records, T Results / Process Orders est X- 1 10 Staff telephones HHA, Nursing Homes / Clarify orders / etc []  - 0 Routine Transfer to another Facility (non-emergent condition) []  - 0 Routine Hospital Admission (non-emergent condition) []  - 0 New Admissions / Manufacturing engineer / Ordering NPWT Apligraf, etc. , []  - 0 Emergency Hospital Admission (emergent condition) X- 1 10 Simple Discharge Coordination []  - 0 Complex (extensive) Discharge Coordination PROCESS - Special Needs []  - 0 Pediatric / Minor Patient Management []  - 0 Isolation Patient Management []  - 0 Hearing / Language / Visual special needs []   - 0 Assessment of Community assistance (transportation, D/C planning, etc.) []  - 0 Additional assistance / Altered mentation []  - 0 Support Surface(s) Assessment (bed, cushion, seat, etc.) INTERVENTIONS - Wound Cleansing / Measurement X - Simple Wound Cleansing -  one wound 1 5 []  - 0 Complex Wound Cleansing - multiple wounds X- 1 5 Wound Imaging (photographs - any number of wounds) []  - 0 Wound Tracing (instead of photographs) X- 1 5 Simple Wound Measurement - one wound []  - 0 Complex Wound Measurement - multiple wounds INTERVENTIONS - Wound Dressings X - Small Wound Dressing one or multiple wounds 1 10 []  - 0 Medium Wound Dressing one or multiple wounds []  - 0 Large Wound Dressing one or multiple wounds []  - 0 Application of Medications - topical []  - 0 Application of Medications - injection INTERVENTIONS - Miscellaneous []  - 0 External ear exam []  - 0 Specimen Collection (cultures, biopsies, blood, body fluids, etc.) []  - 0 Specimen(s) / Culture(s) sent or taken to Lab for analysis []  - 0 Patient Transfer (multiple staff / Nurse, adult / Similar devices) []  - 0 Simple Staple / Suture removal (25 or less) []  - 0 Complex Staple / Suture removal (26 or more) []  - 0 Hypo / Hyperglycemic Management (close monitor of Blood Glucose) ITALYA, KREUZ ([id number]) [id number] Page 3 of 9 []  - 0 Ankle / Brachial Index (ABI) - do not check if billed separately X- 1 5 Vital Signs Has the patient been seen at the hospital within the last three years: Yes Total Score: 95 Level Of Care: New/Established - Level 3 Electronic Signature(s) Signed: 08/11/2023 5:38:18 PM By: Shawn Stall RN, BSN Entered By: Shawn Stall on 08/11/2023 13:23:59 -------------------------------------------------------------------------------- Complex / Palliative Patient Assessment Details Patient Name: Date of Service: [name], [name] 08/11/2023 3:45 PM Medical Record  Number: [id number] Patient Account Number: [id number] Date of Birth/Sex: Treating RN: [birth year] (62 y.o. Arta Silence Primary Care Anylah Scheib: Abran Duke Other Clinician: Referring Everard Interrante: Treating Ismerai Bin/Extender: Domingo Cocking in Treatment: 76 Complex Wound Management Criteria Patient has remarkable or complex co-morbidities requiring medications or treatments that extend wound healing times. Examples: Diabetes mellitus with chronic renal failure or end stage renal disease requiring dialysis Advanced or poorly controlled rheumatoid arthritis Diabetes mellitus and end stage chronic obstructive pulmonary disease Active cancer with current chemo- or radiation therapy DM type II, ESRD-transplant, OA, osteomyelitis right BKA, nutrition- lacking low weight Palliative Wound Management Criteria Care Approach Wound Care Plan: Complex Wound Management Notes Plastics surgery declined to perform skin flap. Electronic Signature(s) Signed: 08/22/2023 5:33:03 PM By: Shawn Stall RN, BSN Signed: 08/31/2023 10:32:45 AM By: Geralyn Corwin DO Entered By: Shawn Stall on 08/22/2023 14:33:03 -------------------------------------------------------------------------------- Encounter Discharge Information Details Patient Name: Date of Service: [name], [name] 08/11/2023 3:45 PM Medical Record Number: [id number] Patient Account Number: [id number] Date of Birth/Sex: Treating RN: 03-29-1961 (62 y.o. Debara Pickett, Yvonne Kendall Primary Care Analleli Gierke: Abran Duke Other Clinician: Referring Kelley Polinsky: Treating Makela Niehoff/Extender: Domingo Cocking in Treatment: 40 Encounter Discharge Information Items Discharge Condition: Stable Ambulatory Status: Wheelchair Discharge Destination: Home Transportation: Private Auto Accompanied By: husband Schedule Follow-up Appointment: Yes Clinical Summary of Care: [name], [name] ([id number])  [id number] Page 4 of 9 Electronic Signature(s) Signed: 08/11/2023 5:38:18 PM By: Shawn Stall RN, BSN Entered By: Shawn Stall on 08/11/2023 13:24:24 -------------------------------------------------------------------------------- Lower Extremity Assessment Details Patient Name: Date of Service: [name], [name] 08/11/2023 3:45 PM Medical Record Number: [id number] Patient Account Number: [id number] Date of Birth/Sex: Treating RN: [birth year] (62 y.o. F) Primary Care Gibson Telleria: Abran Duke Other Clinician: Referring Shakil Dirk: Treating Andriana Casa/Extender: Garey Ham [name]: 41 Electronic Signature(s) Signed: 08/12/2023 12:35:38 PM By: Thayer Dallas Entered By: Thayer Dallas on 08/11/2023 13:14:40 -------------------------------------------------------------------------------- Multi  Wound Chart Details Patient Name: Date of Service: [name], [name] 08/11/2023 3:45 PM Medical Record Number: [id number] Patient Account Number: [id number] Date of Birth/Sex: Treating RN: 26-Oct-1961 (62 y.o. F) Primary Care Daisa Stennis: Abran Duke Other Clinician: Referring Marche Hottenstein: Treating Berkley Cronkright/Extender: Domingo Cocking in Treatment: 25 Vital Signs Height(in): Capillary Blood Glucose(mg/dl): 409 Weight(lbs): Pulse(bpm): 86 Body Mass Index(BMI): Blood Pressure(mmHg): 112/69 Temperature(F): 98.4 Respiratory Rate(breaths/min): 18 [2:Photos:] [N/A:N/A] Sacrum N/A N/A Wound Location: Pressure Injury N/A N/A Wounding Event: Pressure Ulcer N/A N/A Primary Etiology: Anemia, Type I Diabetes, End Stage N/A N/A Comorbid History: Renal Disease, Osteoarthritis, Osteomyelitis, Neuropathy, Received Radiation 10/27/2020 N/A N/A Date Acquired: 52 N/A N/A Weeks of Treatment: Open N/A N/A Wound Status: No N/A N/A Wound Recurrence: 1.8x1.5x0.5 N/A N/A Measurements L x W x D (cm) 2.121 N/A N/A A (cm)  : rea 1.06 N/A N/A Volume (cm) Gala Murdoch ([id number]) [id number] Page 5 of 9 55.00% N/A N/A % Reduction in A rea: 85.00% N/A N/A % Reduction in Volume: Category/Stage III N/A N/A Classification: Medium N/A N/A Exudate A mount: Serosanguineous N/A N/A Exudate Type: red, brown N/A N/A Exudate Color: Distinct, outline attached N/A N/A Wound Margin: Large (67-100%) N/A N/A Granulation A mount: Red, Pink, Hyper-granulation N/A N/A Granulation Quality: Small (1-33%) N/A N/A Necrotic A mount: Fat Layer (Subcutaneous Tissue): Yes N/A N/A Exposed Structures: Fascia: No Tendon: No Muscle: No Joint: No Bone: No Small (1-33%) N/A N/A Epithelialization: Excoriation: No N/A N/A Periwound Skin Texture: Induration: No Callus: No Crepitus: No Rash: No Scarring: No Maceration: Yes N/A N/A Periwound Skin Moisture: Dry/Scaly: No Atrophie Blanche: No N/A N/A Periwound Skin Color: Cyanosis: No Ecchymosis: No Erythema: No Hemosiderin Staining: No Mottled: No Pallor: No Rubor: No No Abnormality N/A N/A Temperature: Yes N/A N/A Tenderness on Palpation: Treatment Notes Electronic Signature(s) Signed: 08/11/2023 4:25:59 PM By: Geralyn Corwin DO Entered By: Geralyn Corwin on 08/11/2023 13:22:08 -------------------------------------------------------------------------------- Multi-Disciplinary Care Plan Details Patient Name: Date of Service: [name], [name] 08/11/2023 3:45 PM Medical Record Number: [id number] Patient Account Number: [id number] Date of Birth/Sex: Treating RN: 14-Jul-1961 (62 y.o. Arta Silence Primary Care Aneudy Champlain: Abran Duke Other Clinician: Referring Keli Buehner: Treating Izamar Linden/Extender: Garey Ham [name]: 59 Active Inactive Electronic Signature(s) Signed: 10/28/2023 2:38:01 PM By: Shawn Stall RN, BSN Previous Signature: 08/11/2023 5:38:18 PM Version By: Shawn Stall RN,  BSN Entered By: Shawn Stall on 10/28/2023 11:38:00 -------------------------------------------------------------------------------- Pain Assessment Details Patient Name: Date of Service: [name], [name] 08/11/2023 3:45 PM Gala Murdoch ([id number]) [id number] Page 6 of 9 Medical Record Number: [id number] Patient Account Number: [id number] Date of Birth/Sex: Treating RN: 10/06/1961 (62 y.o. F) Primary Care Cayleb Jarnigan: Abran Duke Other Clinician: Referring Kavonte Bearse: Treating Annaka Cleaver/Extender: Domingo Cocking in Treatment: 66 Active Problems Location of Pain Severity and Description of Pain Patient Has Paino No Site Locations Pain Management and Medication Current Pain Management: Electronic Signature(s) Signed: 08/12/2023 12:35:38 PM By: Thayer Dallas Entered By: Thayer Dallas on 08/11/2023 13:14:33 -------------------------------------------------------------------------------- Patient/Caregiver Education Details Patient Name: Date of Service: [name], [name] 8/15/2024andnbsp3:45 PM Medical Record Number: [id number] Patient Account Number: [id number] Date of Birth/Gender: Treating RN: 1961/01/02 (63 y.o. Arta Silence Primary Care Physician: Abran Duke Other Clinician: Referring Physician: Treating Physician/Extender: Domingo Cocking in Treatment: 24 Education Assessment Education Provided To: Patient and Caregiver Education Topics Provided Nutrition: Handouts: Nutrition Methods: Explain/Verbal Responses: Reinforcements needed Offloading: Handouts: How Offloading Helps Foot Wounds Heal Methods: Explain/Verbal, Printed Responses: Reinforcements needed Electronic Signature(s) Copher,  Nicholos Johns ([id number]) [id number] Page 7 of 9 Signed: 08/11/2023 5:38:18 PM By: Shawn Stall RN, BSN Entered By: Shawn Stall on 08/11/2023  13:23:37 -------------------------------------------------------------------------------- Wound Assessment Details Patient Name: Date of Service: [name], ISING 08/11/2023 3:45 PM Medical Record Number: [id number] Patient Account Number: [id number] Date of Birth/Sex: Treating RN: 11/27/1961 (62 y.o. F) Primary Care Tlaloc Taddei: Abran Duke Other Clinician: Referring Jayliani Wanner: Treating Dominick Morella/Extender: Garey Ham [name]: 38 Wound Status Wound Number: 2 Primary Pressure Ulcer Etiology: Wound Location: Sacrum Wound Open Wounding Event: Pressure Injury Status: Date Acquired: 10/27/2020 Comorbid Anemia, Type I Diabetes, End Stage Renal Disease, Osteoarthritis, Weeks Of Treatment: 38 History: Osteomyelitis, Neuropathy, Received Radiation Clustered Wound: No Photos Wound Measurements Length: (cm) 1.8 Width: (cm) 1.5 Depth: (cm) 0.5 Area: (cm) 2.121 Volume: (cm) 1.06 % Reduction in Area: 55% % Reduction in Volume: 85% Epithelialization: Small (1-33%) Tunneling: No Undermining: No Wound Description Classification: Category/Stage III Wound Margin: Distinct, outline attached Exudate Amount: Medium Exudate Type: Serosanguineous Exudate Color: red, brown Foul Odor After Cleansing: No Slough/Fibrino Yes Wound Bed Granulation Amount: Large (67-100%) Exposed Structure Granulation Quality: Red, Pink, Hyper-granulation Fascia Exposed: No Necrotic Amount: Small (1-33%) Fat Layer (Subcutaneous Tissue) Exposed: Yes Necrotic Quality: Adherent Slough Tendon Exposed: No Muscle Exposed: No Joint Exposed: No Bone Exposed: No Periwound Skin Texture Texture Color No Abnormalities Noted: No No Abnormalities Noted: No Callus: No Atrophie Blanche: No Crepitus: No Cyanosis: No Excoriation: No Ecchymosis: No Induration: No Erythema: No Rash: No Hemosiderin Staining: No Scarring: No Mottled: No Pallor: No MADELENE, RISBERG ([id number])  [id number] Page 8 of 9 Pallor: No Moisture Rubor: No No Abnormalities Noted: No Dry / Scaly: No Temperature / Pain Maceration: Yes Temperature: No Abnormality Tenderness on Palpation: Yes Treatment Notes Wound #2 (Sacrum) Cleanser Wound Cleanser Discharge Instruction: Cleanse the wound with wound cleanser prior to applying a clean dressing using gauze sponges, not tissue or cotton balls. Peri-Wound Care Skin Prep Discharge Instruction: Use skin prep as directed Topical Primary Dressing Dakin's Solution 0.25%, 16 (oz) Discharge Instruction: Moisten gauze with Dakin's solution packing the collagen. Promogran Prisma Matrix, 4.34 (sq in) (silver collagen) Discharge Instruction: Moisten collagen with saline or hydrogel Secondary Dressing ABD Pad, 5x9 Discharge Instruction: Apply over primary dressing as directed. Woven Gauze Sponge, Non-Sterile 4x4 in Discharge Instruction: Apply over primary dressing as directed. Secured With 26M Medipore H Soft Cloth Surgical T ape, 4 x 10 (in/yd) Discharge Instruction: Secure with tape as directed. Compression Wrap Compression Stockings Add-Ons Electronic Signature(s) Signed: 08/12/2023 12:35:38 PM By: Thayer Dallas Entered By: Thayer Dallas on 08/11/2023 13:15:58 -------------------------------------------------------------------------------- Vitals Details Patient Name: Date of Service: FATIMAH, KROLCZYK 08/11/2023 3:45 PM Medical Record Number: [id number] Patient Account Number: [id number] Date of Birth/Sex: Treating RN: 05-14-1961 (62 y.o. F) Primary Care Sohum Delillo: Abran Duke Other Clinician: Referring Jayme Mednick: Treating Kamylle Axelson/Extender: Garey Ham [name]: 43 Vital Signs Time Taken: 16:13 Temperature (F): 98.4 Pulse (bpm): 86 Respiratory Rate (breaths/min): 18 Blood Pressure (mmHg): 112/69 Capillary Blood Glucose (mg/dl): 093 Reference Range: 80 - 120 mg /  dl Electronic Signature(s) Signed: 08/12/2023 12:35:38 PM By: Zara Council, Signed: 08/12/2023 12:35:38 PM By: Cristal Ford ([id number]) [id number] Page 9 of 9 Entered By: Thayer Dallas on 08/11/2023 13:14:26

## 2023-08-12 NOTE — Progress Notes (Signed)
SIMRAH, BURDSALL ([id number]) [id number] Page 1 of 8 Visit Report for 08/11/2023 Chief Complaint Document Details Patient Name: Date of Service: [name], [name] 08/11/2023 3:45 PM Medical Record Number: [id number] Patient Account Number: [id number] Date of Birth/Sex: Treating RN: [birth year] (62 y.o. F) Primary Care Provider: Abran Duke Other Clinician: Referring Provider: Treating Provider/Extender: Domingo Cocking in Treatment: 48 Information Obtained from: Patient Chief Complaint Sacral ulcer Electronic Signature(s) Signed: 08/11/2023 4:25:59 PM By: Geralyn Corwin DO Entered By: Geralyn Corwin on 08/11/2023 16:22:24 -------------------------------------------------------------------------------- HPI Details Patient Name: Date of Service: [name], [name] 08/11/2023 3:45 PM Medical Record Number: [id number] Patient Account Number: [id number] Date of Birth/Sex: Treating RN: 11-07-1961 (62 y.o. F) Primary Care Provider: Abran Duke Other Clinician: Referring Provider: Treating Provider/Extender: Domingo Cocking in Treatment: 65 History of Present Illness HPI Description: Admission 6/24 [name] is a 62 year old female with a past medical history of type 1 diabetes, right BKA, and kidney transplant that presents to the clinic for an 21-month history of sacral ulcer. She has been using wet-to-dry dressings up until 5 weeks ago when she was started on a wound VAC. 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 She has completed 6 weeks of treatment and states she has been discharged by infectious disease. She currently denies any issues to the wound area. She reports minimal pain. She denies signs of infection. 7/11; patient presents for 2-week follow-up. She uses a wound VAC that is changed with home health 3 times a week. She denies any signs  of infection. She has no complaints or issues today. 8/1; patient presents for follow-up. She continues to use a wound VAC and this is changed with home health. She reports pain to the sacrum that started 1 week ago. She denies systemic signs of infection. 8/15; patient presents for 2-week follow-up. She has been using wet-to-dry dressings daily. She reports improvement in the wound size and appearance. She denies infectious symptoms. 9/12; patient presents for follow-up. She has been using wet-to-dry Dakin's moistened gauze. She reports following up with infectious disease and is currently taking amoxicillin. 10/6; patient presents for follow-up. She has been using Dakin's wet-to-dry moistened gauze dressings. She is still taking amoxicillin. She canceled her appointment with infectious disease yesterday because she is tired of going to doctors appointments. She has not picked up gentamicin cream from the pharmacy to use on the wound bed. She currently denies signs of infection. 11/3; patient presents for follow-up. She has been using Dakin's wet-to-dry dressings. She has no issues or complaints today. She denies signs of infection. 12/1; patient presents for follow-up. She continues to use Dakin's wet-to-dry dressings. Patient followed up with Dr. Daiva Eves, ID and amoxicillin was stopped as she had completed her course for sacral osteomyelitis. She reports itching to the periwound. She denies signs of infection. 1/6; patient presents for follow-up. She has been using Dakin's wet-to-dry dressings. She never received the wound VAC. She reports improvement with ketoconazole to the periwound. She has no issues or complaints today. She denies signs of infection. 3/17; patient presents for follow-up. She has been using Dakin's wet-to-dry dressings. She has no issues or complaints today. [name], [name] ([id number]) [id number] Page 2 of 8 3/30; patient presents for follow-up.  She has been using collagen with Dakin's wet-to-dry packing. She has no issues or complaints today. She does state that she would like to try and do a muscle flap to help close the  wound. 4/18; patient presents for follow-up. She changed her mind on a muscle flap. She would like to try a wound VAC. We went ahead and ordered this and she has been using it for the past week. She has no issues or complaints today. She denies signs of infection. 5/25; patient presents for follow-up. She has been using the wound VAC and home health is coming in Monday Wednesday Friday to change it. She denies signs of infection. 11/12/2022; Patient was referred to wound care/plastic surgery for evaluation of potential muscle flap to the sacrum. Patient saw Dr. Mardene Speak on 07/28/2022. Unfortunately patient is not a flap candidate due to her multiple comorbidities. Patient has been using Dakin's wet-to-dry dressings. 1/11; patient presents for follow-up. She has been using collagen with Dakin's wet-to-dry packing. She has no issues or complaints today. 4/9; patient presents for follow-up. She is been using collagen with Dakin's wet-to-dry backing. She has no issues or complaints today. She denies signs of infection. 6/4; 40-month follow-up. Patient has been using collagen with Dakin's wet-to-dry backing. Her husband changes the dressings. She arrived in clinic today with 2 areas of polypoid tissue protruding from the wound. 7/16; patient presents for follow-up. Patient has been using collagen with Dakin's wet-to-dry backing. She has no issues or complaints today. 8/15; patient presents for follow-up. She has been using collagen with Dakin's wet-to-dry packing. She has no issues or complaints today. Wound is slightly smaller. Electronic Signature(s) Signed: 08/11/2023 4:25:59 PM By: Geralyn Corwin DO Entered By: Geralyn Corwin on 08/11/2023  16:23:57 -------------------------------------------------------------------------------- Physical Exam Details Patient Name: Date of Service: [name], [name] 08/11/2023 3:45 PM Medical Record Number: [id number] Patient Account Number: [id number] Date of Birth/Sex: Treating RN: [birth year] (62 y.o. F) Primary Care Provider: Abran Duke Other Clinician: Referring Provider: Treating Provider/Extender: Domingo Cocking in Treatment: 77 Constitutional respirations regular, non-labored and within target range for patient.Marland Kitchen Psychiatric pleasant and cooperative. Notes Sacral region: open wound with pale granulation tissue present. Undermining circumferentially with increased depth in the center. No signs of surrounding soft tissue infection. Electronic Signature(s) Signed: 08/11/2023 4:25:59 PM By: Geralyn Corwin DO Entered By: Geralyn Corwin on 08/11/2023 16:24:25 -------------------------------------------------------------------------------- Physician Orders Details Patient Name: Date of Service: KOYA, BARTOE 08/11/2023 3:45 PM Medical Record Number: [id number] Patient Account Number: [id number] Date of Birth/Sex: Treating RN: 1961/04/09 (62 y.o. Arta Silence Primary Care Provider: Abran Duke Other Clinician: Referring Provider: Treating Provider/Extender: Domingo Cocking in Treatment: 5 Verbal / Phone Orders: No Gala Murdoch ([id number]) [id number] Page 3 of 8 Diagnosis Coding Follow-up Appointments Return appointment in 1 month. - Dr. Mikey Bussing Tuesday 09/08/2023 3pm room 9 Anesthetic (In clinic) Topical Lidocaine 4% applied to wound bed Off-Loading Low air-loss mattress (Group 2) - continue to use. Roho cushion for wheelchair - continue using your specialty cushion. Turn and reposition every 2 hours Additional Orders / Instructions Follow Nutritious Diet - increaqse protein-  double the normal amount. Juven Shake 1-2 times daily. Wound Treatment Wound #2 - Sacrum Cleanser: Wound Cleanser (Generic) 1 x Per Day/30 Days Discharge Instructions: Cleanse the wound with wound cleanser prior to applying a clean dressing using gauze sponges, not tissue or cotton balls. Peri-Wound Care: Skin Prep (Generic) 1 x Per Day/30 Days Discharge Instructions: Use skin prep as directed Prim Dressing: Dakin's Solution 0.25%, 16 (oz) 1 x Per Day/30 Days ary Discharge Instructions: Moisten gauze with Dakin's solution packing the collagen. Prim Dressing: Promogran Prisma Matrix, 4.34 (sq in) (silver collagen) 1 x Per  Day/30 Days ary Discharge Instructions: Moisten collagen with saline or hydrogel Secondary Dressing: ABD Pad, 5x9 1 x Per Day/30 Days Discharge Instructions: Apply over primary dressing as directed. Secondary Dressing: Woven Gauze Sponge, Non-Sterile 4x4 in (Generic) 1 x Per Day/30 Days Discharge Instructions: Apply over primary dressing as directed. Secured With: 31M Medipore H Soft Cloth Surgical T ape, 4 x 10 (in/yd) (DME) (Generic) 1 x Per Day/30 Days Discharge Instructions: Secure with tape as directed. Electronic Signature(s) Signed: 08/11/2023 4:25:59 PM By: Geralyn Corwin DO Signed: 08/11/2023 5:38:18 PM By: Shawn Stall RN, BSN Entered By: Shawn Stall on 08/11/2023 16:25:15 -------------------------------------------------------------------------------- Problem List Details Patient Name: Date of Service: ANGELIKI, BUGH 08/11/2023 3:45 PM Medical Record Number: [id number] Patient Account Number: [id number] Date of Birth/Sex: Treating RN: 07-21-1961 (62 y.o. F) Primary Care Provider: Abran Duke Other Clinician: Referring Provider: Treating Provider/Extender: Domingo Cocking in Treatment: 19 Active Problems ICD-10 Encounter Code Description Active Date MDM Diagnosis E10.622 Type 1 diabetes mellitus with other skin ulcer  11/12/2022 No Yes L89.153 Pressure ulcer of sacral region, stage 3 11/12/2022 No Yes AMARILYS, MEEKER ([id number]) [id number] Page 4 of 8 Z89.511 Acquired absence of right leg below knee 11/12/2022 No Yes Z94.0 Kidney transplant status 11/12/2022 No Yes M86.68 Other chronic osteomyelitis, other site 11/12/2022 No Yes Inactive Problems Resolved Problems Electronic Signature(s) Signed: 08/11/2023 4:25:59 PM By: Geralyn Corwin DO Entered By: Geralyn Corwin on 08/11/2023 16:22:02 -------------------------------------------------------------------------------- Progress Note Details Patient Name: Date of Service: [name], [name] 08/11/2023 3:45 PM Medical Record Number: [id number] Patient Account Number: [id number] Date of Birth/Sex: Treating RN: 1961-11-06 (62 y.o. F) Primary Care Provider: Abran Duke Other Clinician: Referring Provider: Treating Provider/Extender: Domingo Cocking in Treatment: 66 Subjective Chief Complaint Information obtained from Patient Sacral ulcer History of Present Illness (HPI) Admission 6/24 Ms. Zhaniya Odonoghue is a 62 year old female with a past medical history of type 1 diabetes, right BKA, and kidney transplant that presents to the clinic for an 56-month history of sacral ulcer. She has been using wet-to-dry dressings up until 5 weeks ago when she was started on a wound VAC. 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 She has completed 6 weeks of treatment and states she has been discharged by infectious disease. She currently denies any issues to the wound area. She reports minimal pain. She denies signs of infection. 7/11; patient presents for 2-week follow-up. She uses a wound VAC that is changed with home health 3 times a week. She denies any signs of infection. She has no complaints or issues today. 8/1; patient presents for follow-up. She  continues to use a wound VAC and this is changed with home health. She reports pain to the sacrum that started 1 week ago. She denies systemic signs of infection. 8/15; patient presents for 2-week follow-up. She has been using wet-to-dry dressings daily. She reports improvement in the wound size and appearance. She denies infectious symptoms. 9/12; patient presents for follow-up. She has been using wet-to-dry Dakin's moistened gauze. She reports following up with infectious disease and is currently taking amoxicillin. 10/6; patient presents for follow-up. She has been using Dakin's wet-to-dry moistened gauze dressings. She is still taking amoxicillin. She canceled her appointment with infectious disease yesterday because she is tired of going to doctors appointments. She has not picked up gentamicin cream from the pharmacy to use on the wound bed. She currently denies signs of infection. 11/3; patient presents for follow-up. She  has been using Dakin's wet-to-dry dressings. She has no issues or complaints today. She denies signs of infection. 12/1; patient presents for follow-up. She continues to use Dakin's wet-to-dry dressings. Patient followed up with Dr. Daiva Eves, ID and amoxicillin was stopped as she had completed her course for sacral osteomyelitis. She reports itching to the periwound. She denies signs of infection. 1/6; patient presents for follow-up. She has been using Dakin's wet-to-dry dressings. She never received the wound VAC. She reports improvement with ketoconazole to the periwound. She has no issues or complaints today. She denies signs of infection. 3/17; patient presents for follow-up. She has been using Dakin's wet-to-dry dressings. She has no issues or complaints today. 3/30; patient presents for follow-up. She has been using collagen with Dakin's wet-to-dry packing. She has no issues or complaints today. She does state that MADGE, SOUFFRONT ([id number])  [id number] Page 5 of 8 she would like to try and do a muscle flap to help close the wound. 4/18; patient presents for follow-up. She changed her mind on a muscle flap. She would like to try a wound VAC. We went ahead and ordered this and she has been using it for the past week. She has no issues or complaints today. She denies signs of infection. 5/25; patient presents for follow-up. She has been using the wound VAC and home health is coming in Monday Wednesday Friday to change it. She denies signs of infection. 11/12/2022; Patient was referred to wound care/plastic surgery for evaluation of potential muscle flap to the sacrum. Patient saw Dr. Mardene Speak on 07/28/2022. Unfortunately patient is not a flap candidate due to her multiple comorbidities. Patient has been using Dakin's wet-to-dry dressings. 1/11; patient presents for follow-up. She has been using collagen with Dakin's wet-to-dry packing. She has no issues or complaints today. 4/9; patient presents for follow-up. She is been using collagen with Dakin's wet-to-dry backing. She has no issues or complaints today. She denies signs of infection. 6/4; 37-month follow-up. Patient has been using collagen with Dakin's wet-to-dry backing. Her husband changes the dressings. She arrived in clinic today with 2 areas of polypoid tissue protruding from the wound. 7/16; patient presents for follow-up. Patient has been using collagen with Dakin's wet-to-dry backing. She has no issues or complaints today. 8/15; patient presents for follow-up. She has been using collagen with Dakin's wet-to-dry packing. She has no issues or complaints today. Wound is slightly smaller. Patient History Information obtained from Patient. Family History Cancer - Father, Diabetes - Father, Heart Disease - Maternal Grandparents, Hypertension - Mother, Thyroid Problems - Mother,Siblings, No family history of Hereditary Spherocytosis, Kidney Disease, Lung  Disease, Seizures, Stroke, Tuberculosis. Social History Former smoker - from teens to 81's, Marital Status - Married, Alcohol Use - Rarely - beer, Drug Use - No History, Caffeine Use - Daily - coffee. Medical History Hematologic/Lymphatic Patient has history of Anemia Endocrine Patient has history of Type I Diabetes - s Genitourinary Patient has history of End Stage Renal Disease - off dialysis since transplants Immunological Denies history of Lupus Erythematosus, Raynauds, Scleroderma Integumentary (Skin) Denies history of History of Burn Musculoskeletal Patient has history of Osteoarthritis, Osteomyelitis Neurologic Patient has history of Neuropathy Oncologic Patient has history of Received Radiation Denies history of Received Chemotherapy Hospitalization/Surgery History - infected wound (pressure) 2022. - (R) BKA amp Oct 2021. - UTI 12/22-12/27/2022. Objective Constitutional respirations regular, non-labored and within target range for patient.. Vitals Time Taken: 4:13 PM, Temperature: 98.4 F, Pulse: 86 bpm, Respiratory Rate: 18 breaths/min, Blood Pressure:  112/69 mmHg, Capillary Blood Glucose: 123 mg/dl. Psychiatric pleasant and cooperative. General Notes: Sacral region: open wound with pale granulation tissue present. Undermining circumferentially with increased depth in the center. No signs of surrounding soft tissue infection. Integumentary (Hair, Skin) Wound #2 status is Open. Original cause of wound was Pressure Injury. The date acquired was: 10/27/2020. The wound has been in treatment 38 weeks. The wound is located on the Sacrum. The wound measures 1.8cm length x 1.5cm width x 0.5cm depth; 2.121cm^2 area and 1.06cm^3 volume. There is Fat Layer (Subcutaneous Tissue) exposed. There is no tunneling or undermining noted. There is a medium amount of serosanguineous drainage noted. The wound margin is distinct with the outline attached to the wound base. There is large (67-100%)  red, pink, hyper - granulation within the wound bed. There is a small (1-33%) amount of necrotic tissue within the wound bed including Adherent Slough. The periwound skin appearance exhibited: Maceration. The periwound skin appearance did not exhibit: Callus, Crepitus, Excoriation, Induration, Rash, Scarring, Dry/Scaly, Atrophie Blanche, Cyanosis, Ecchymosis, Hemosiderin Staining, Mottled, Pallor, Rubor, Erythema. Periwound temperature was noted as No Abnormality. The periwound has tenderness on palpation. LOLETIA, HARMELINK ([id number]) [id number] Page 6 of 8 Assessment Active Problems ICD-10 Type 1 diabetes mellitus with other skin ulcer Pressure ulcer of sacral region, stage 3 Acquired absence of right leg below knee Kidney transplant status Other chronic osteomyelitis, other site Patient's wound is slightly smaller. No signs of infection. I recommended continuing the course with collagen and Dakin's wet-to-dry backing. Continue aggressive offloading. This is a palliative wound care case. Patient is to call with any questions or concerns. Plan Follow-up Appointments: Return appointment in 1 month. - Dr. Mikey Bussing Tuesday 09/08/2023 3pm room 9 Anesthetic: (In clinic) Topical Lidocaine 4% applied to wound bed Off-Loading: Low air-loss mattress (Group 2) - continue to use. Roho cushion for wheelchair - continue using your specialty cushion. Turn and reposition every 2 hours Additional Orders / Instructions: Follow Nutritious Diet - increaqse protein- double the normal amount. Juven Shake 1-2 times daily. WOUND #2: - Sacrum Wound Laterality: Cleanser: Wound Cleanser (Generic) 1 x Per Day/30 Days Discharge Instructions: Cleanse the wound with wound cleanser prior to applying a clean dressing using gauze sponges, not tissue or cotton balls. Peri-Wound Care: Skin Prep (Generic) 1 x Per Day/30 Days Discharge Instructions: Use skin prep as directed Prim Dressing:  Dakin's Solution 0.25%, 16 (oz) 1 x Per Day/30 Days ary Discharge Instructions: Moisten gauze with Dakin's solution packing the collagen. Prim Dressing: Promogran Prisma Matrix, 4.34 (sq in) (silver collagen) 1 x Per Day/30 Days ary Discharge Instructions: Moisten collagen with saline or hydrogel Secondary Dressing: ABD Pad, 5x9 1 x Per Day/30 Days Discharge Instructions: Apply over primary dressing as directed. Secondary Dressing: Woven Gauze Sponge, Non-Sterile 4x4 in (Generic) 1 x Per Day/30 Days Discharge Instructions: Apply over primary dressing as directed. Secured With: 85M Medipore H Soft Cloth Surgical T ape, 4 x 10 (in/yd) (Generic) 1 x Per Day/30 Days Discharge Instructions: Secure with tape as directed. 1. Collagen with Dakin's wet-to-dry backing 2. Aggressive offloading 3. Follow-up in 1 month Electronic Signature(s) Signed: 08/11/2023 4:25:59 PM By: Geralyn Corwin DO Entered By: Geralyn Corwin on 08/11/2023 16:25:25 -------------------------------------------------------------------------------- HxROS Details Patient Name: Date of Service: [name], [name] 08/11/2023 3:45 PM Medical Record Number: [id number] Patient Account Number: [id number] Date of Birth/Sex: Treating RN: August 16, 1961 (62 y.o. F) Primary Care Provider: Abran Duke Other Clinician: Referring Provider: Treating Provider/Extender: Domingo Cocking in Treatment: 60 Information  Obtained From Patient [name], [name] ([id number]) [id number] Page 7 of 8 Hematologic/Lymphatic Medical History: Positive for: Anemia Endocrine Medical History: Positive for: Type I Diabetes - s Time with diabetes: 56 years Treated with: Insulin Blood sugar tested every day: Yes Tested : constant with device Genitourinary Medical History: Positive for: End Stage Renal Disease - off dialysis since transplants Immunological Medical History: Negative for: Lupus  Erythematosus; Raynauds; Scleroderma Integumentary (Skin) Medical History: Negative for: History of Burn Musculoskeletal Medical History: Positive for: Osteoarthritis; Osteomyelitis Neurologic Medical History: Positive for: Neuropathy Oncologic Medical History: Positive for: Received Radiation Negative for: Received Chemotherapy Immunizations Pneumococcal Vaccine: Received Pneumococcal Vaccination: Yes Received Pneumococcal Vaccination On or After 60th Birthday: No Immunization Notes: states being up to date on all - unknown dates for immuization Implantable Devices None Hospitalization / Surgery History Type of Hospitalization/Surgery infected wound (pressure) 2022 (R) BKA amp Oct 2021 UTI 12/22-12/27/2022 Family and Social History Cancer: Yes - Father; Diabetes: Yes - Father; Heart Disease: Yes - Maternal Grandparents; Hereditary Spherocytosis: No; Hypertension: Yes - Mother; Kidney Disease: No; Lung Disease: No; Seizures: No; Stroke: No; Thyroid Problems: Yes - Mother,Siblings; Tuberculosis: No; Former smoker - from teens to 37's; Marital Status - Married; Alcohol Use: Rarely - beer; Drug Use: No History; Caffeine Use: Daily - coffee; Financial Concerns: No; Food, Clothing or Shelter Needs: No; Support System Lacking: No; Transportation Concerns: No Electronic Signature(s) Signed: 08/11/2023 4:25:59 PM By: Geralyn Corwin DO Entered By: Geralyn Corwin on 08/11/2023 16:24:02 Gala Murdoch ([id number]) [id number] Page 8 of 8 -------------------------------------------------------------------------------- SuperBill Details Patient Name: Date of Service: [name], [name] 08/11/2023 Medical Record Number: [id number] Patient Account Number: [id number] Date of Birth/Sex: Treating RN: [birth year] (62 y.o. Arta Silence Primary Care Provider: Abran Duke Other Clinician: Referring Provider: Treating Provider/Extender: Garey Ham [name]: 38 Diagnosis Coding ICD-10 Codes Code Description E10.622 Type 1 diabetes mellitus with other skin ulcer L89.153 Pressure ulcer of sacral region, stage 3 Z89.511 Acquired absence of right leg below knee Z94.0 Kidney transplant status M86.68 Other chronic osteomyelitis, other site Facility Procedures : CPT4 Code: [id number] Description: 99213 - WOUND CARE VISIT-LEV 3 EST PT Modifier: Quantity: 1 Physician Procedures : CPT4 Code Description Modifier [id number] 99213 - WC PHYS LEVEL 3 - EST PT ICD-10 Diagnosis Description E10.622 Type 1 diabetes mellitus with other skin ulcer L89.153 Pressure ulcer of sacral region, stage 3 Z89.511 Acquired absence of right leg below  knee M86.68 Other chronic osteomyelitis, other site Quantity: 1 Electronic Signature(s) Signed: 08/11/2023 4:25:59 PM By: Geralyn Corwin DO Entered By: Geralyn Corwin on 08/11/2023 16:25:39

## 2023-09-08 ENCOUNTER — Ambulatory Visit (HOSPITAL_BASED_OUTPATIENT_CLINIC_OR_DEPARTMENT_OTHER): Payer: BC Managed Care – PPO | Admitting: Internal Medicine

## 2023-10-06 ENCOUNTER — Ambulatory Visit (HOSPITAL_BASED_OUTPATIENT_CLINIC_OR_DEPARTMENT_OTHER): Payer: BC Managed Care – PPO | Admitting: Internal Medicine

## 2023-12-12 ENCOUNTER — Encounter (HOSPITAL_COMMUNITY): Payer: Self-pay

## 2023-12-12 ENCOUNTER — Emergency Department (HOSPITAL_COMMUNITY)
Admission: EM | Admit: 2023-12-12 | Discharge: 2023-12-12 | Disposition: A | Discharge status: Home / Self Care | Payer: BC Managed Care – PPO | Attending: Emergency Medicine | Admitting: Emergency Medicine

## 2023-12-12 ENCOUNTER — Other Ambulatory Visit: Payer: Self-pay

## 2023-12-12 DIAGNOSIS — Z79899 Other long term (current) drug therapy: Secondary | ICD-10-CM | POA: Insufficient documentation

## 2023-12-12 DIAGNOSIS — I129 Hypertensive chronic kidney disease with stage 1 through stage 4 chronic kidney disease, or unspecified chronic kidney disease: Secondary | ICD-10-CM | POA: Insufficient documentation

## 2023-12-12 DIAGNOSIS — E039 Hypothyroidism, unspecified: Secondary | ICD-10-CM | POA: Insufficient documentation

## 2023-12-12 DIAGNOSIS — N189 Chronic kidney disease, unspecified: Secondary | ICD-10-CM | POA: Insufficient documentation

## 2023-12-12 DIAGNOSIS — E1022 Type 1 diabetes mellitus with diabetic chronic kidney disease: Secondary | ICD-10-CM | POA: Insufficient documentation

## 2023-12-12 DIAGNOSIS — E162 Hypoglycemia, unspecified: Secondary | ICD-10-CM | POA: Diagnosis present

## 2023-12-12 DIAGNOSIS — Z794 Long term (current) use of insulin: Secondary | ICD-10-CM | POA: Insufficient documentation

## 2023-12-12 DIAGNOSIS — E10649 Type 1 diabetes mellitus with hypoglycemia without coma: Secondary | ICD-10-CM | POA: Diagnosis not present

## 2023-12-12 LAB — COMPREHENSIVE METABOLIC PANEL
ALT: 9 U/L (ref 0–44)
AST: 18 U/L (ref 15–41)
Albumin: 2.8 g/dL — ABNORMAL LOW (ref 3.5–5.0)
Alkaline Phosphatase: 75 U/L (ref 38–126)
Anion gap: 7 (ref 5–15)
BUN: 51 mg/dL — ABNORMAL HIGH (ref 8–23)
CO2: 20 mmol/L — ABNORMAL LOW (ref 22–32)
Calcium: 8.7 mg/dL — ABNORMAL LOW (ref 8.9–10.3)
Chloride: 108 mmol/L (ref 98–111)
Creatinine, Ser: 1.67 mg/dL — ABNORMAL HIGH (ref 0.44–1.00)
GFR, Estimated: 34 mL/min — ABNORMAL LOW (ref >60)
Glucose, Bld: 291 mg/dL — ABNORMAL HIGH (ref 70–99)
Potassium: 4.8 mmol/L (ref 3.5–5.1)
Sodium: 135 mmol/L (ref 135–145)
Total Bilirubin: 0.5 mg/dL (ref <1.2)
Total Protein: 5.9 g/dL — ABNORMAL LOW (ref 6.5–8.1)

## 2023-12-12 LAB — URINALYSIS, ROUTINE W REFLEX MICROSCOPIC
Bilirubin Urine: NEGATIVE
Glucose, UA: NEGATIVE mg/dL
Hgb urine dipstick: NEGATIVE
Ketones, ur: NEGATIVE mg/dL
Nitrite: NEGATIVE
Protein, ur: 100 mg/dL — AB
Specific Gravity, Urine: 1.013 (ref 1.005–1.030)
pH: 8 (ref 5.0–8.0)

## 2023-12-12 LAB — CBC
HCT: 27.6 % — ABNORMAL LOW (ref 36.0–46.0)
Hemoglobin: 8.3 g/dL — ABNORMAL LOW (ref 12.0–15.0)
MCH: 32.8 pg (ref 26.0–34.0)
MCHC: 30.1 g/dL (ref 30.0–36.0)
MCV: 109.1 fL — ABNORMAL HIGH (ref 80.0–100.0)
Platelets: 207 10*3/uL (ref 150–400)
RBC: 2.53 MIL/uL — ABNORMAL LOW (ref 3.87–5.11)
RDW: 15.1 % (ref 11.5–15.5)
WBC: 6.4 10*3/uL (ref 4.0–10.5)
nRBC: 0 % (ref 0.0–0.2)

## 2023-12-12 LAB — CBG MONITORING, ED
Glucose-Capillary: 41 mg/dL — CL (ref 70–99)
Glucose-Capillary: 122 mg/dL — ABNORMAL HIGH (ref 70–99)
Glucose-Capillary: 294 mg/dL — ABNORMAL HIGH (ref 70–99)

## 2023-12-12 NOTE — ED Notes (Signed)
Given orange juice. Tolerated well. Sandwich given now. Tolerating well

## 2023-12-12 NOTE — ED Provider Notes (Signed)
[place] EMERGENCY DEPARTMENT AT [facility] Provider Note   CSN: [id number] Arrival date & time: 12/12/23  1150     History  Chief Complaint  Patient presents with   Hypoglycemia    [name] is a 62 y.o. female.  Patient with history of CKD, type 1 diabetes, hypertension presents today with complaints of hypoglycemia.  Patient has a insulin pump in place.  She woke up this morning in good health.  Her husband works the night stiffed and had just gotten home.  He normally makes her breakfast when he gets home, however he unfortunately fell asleep instead and therefore the patient did not have anything to eat for breakfast.  When he woke up approximately 1 hour later, the patient was confused.  He was unable to get her to eat or drink anything and subsequently called EMS.  Does state that he checked her sugar via their Dexcom machine and it was 120, however when he did a fingerstick check it was 50.  EMS gave D10 infusion and the patient immediately returned to her baseline.  Patient's husband does note that he was concerned that her urine was infected as it has appeared more cloudy in the Foley bag.  She is a kidney transplant patient and has a chronic indwelling Foley catheter.  Patient denies any abdominal pain or fevers.   The history is provided by the patient and the spouse. No language interpreter was used.  Hypoglycemia      Home Medications Prior to Admission medications   Medication Sig Start Date End Date Taking? Authorizing Provider  azaTHIOprine (IMURAN) 50 MG tablet Take 1 tablet (50 mg total) by mouth daily. 03/02/21  Yes Alwyn Ren, MD  cycloSPORINE modified (NEORAL) 25 MG capsule Take 75 mg by mouth in the morning and at bedtime. 12/13/21  Yes [provider]  ferrous sulfate 325 (65 FE) MG tablet Take 325 mg by mouth daily with breakfast.   Yes [provider]  levothyroxine (SYNTHROID) 100 MCG tablet Take 100 mcg by  mouth daily before breakfast.   Yes [provider]  lisinopril (ZESTRIL) 5 MG tablet Take 5 mg by mouth at bedtime. 12/19/19  Yes [provider]  loperamide (IMODIUM) 2 MG capsule Take 1 capsule (2 mg total) by mouth as needed for diarrhea or loose stools. 03/02/21  Yes Alwyn Ren, MD  NOVOLOG 100 UNIT/ML injection Inject into the skin See admin instructions. Per Omnipod insulin pump   Yes [provider]  predniSONE (DELTASONE) 5 MG tablet Take 1 tablet (5 mg total) by mouth daily with breakfast. 03/02/21  Yes Alwyn Ren, MD  acetaminophen (TYLENOL) 325 MG tablet Take 2 tablets (650 mg total) by mouth every 6 (six) hours as needed for mild pain (or Fever >/= 101). Patient not taking: Reported on 12/12/2023 10/06/20   Rhetta Mura, MD  ascorbic acid (VITAMIN C) 250 MG tablet Take 1 tablet (250 mg total) by mouth 2 (two) times daily. Patient not taking: Reported on 12/12/2023 12/22/21   Rodolph Bong, MD  atorvastatin (LIPITOR) 10 MG tablet Take 1 tablet (10 mg total) by mouth daily. Patient not taking: Reported on 12/12/2023 03/02/21   Alwyn Ren, MD  cephALEXin (KEFLEX) 500 MG capsule Take 1 capsule (500 mg total) by mouth 3 (three) times daily. Patient not taking: Reported on 12/12/2023 07/31/23   Jacalyn Lefevre, MD  cholestyramine Lanetta Inch) 4 g packet Take 1 packet (4 g total) by mouth  2 (two) times daily. Patient not taking: Reported on 12/12/2023 03/02/21   Alwyn Ren, MD  collagenase (SANTYL) ointment Apply topically daily. Patient not taking: Reported on 12/12/2023 03/02/21   Alwyn Ren, MD  HYDROcodone bit-homatropine [facility]) 5-1.5 MG/5ML syrup Take 5 mLs by mouth every 6 (six) hours as needed for cough. Patient not taking: Reported on 12/12/2023 12/22/21   Rodolph Bong, MD  insulin lispro (HUMALOG) 100 UNIT/ML injection Inject 0.02 mLs (2 Units total) into the skin 3 (three) times daily before meals.  Sliding scale if 200-250=2 units;251-300=4units;301-350=6 units;351-400=8 units;104-450=10 units;451-500=12 units subcutaneously before meals and at bedtime for DM Patient not taking: Reported on 12/12/2023 03/02/21   Alwyn Ren, MD  levothyroxine (SYNTHROID) 88 MCG tablet Take 1 tablet (88 mcg total) by mouth daily before breakfast. Patient not taking: Reported on 12/12/2023 03/02/21   Alwyn Ren, MD  liver oil-zinc oxide (DESITIN) 40 % ointment Apply topically as needed for irritation. Patient not taking: Reported on 12/12/2023 03/02/21   Alwyn Ren, MD  ondansetron (ZOFRAN) 4 MG tablet Take 1 tablet (4 mg total) by mouth every 6 (six) hours as needed for nausea or vomiting. Patient not taking: Reported on 12/12/2023 06/26/22   Al Decant, PA-C  ondansetron (ZOFRAN-ODT) 4 MG disintegrating tablet Take 1 tablet (4 mg total) by mouth every 4 (four) hours as needed for nausea or vomiting. Patient not taking: Reported on 12/12/2023 03/02/21   Alwyn Ren, MD  oxyCODONE (OXY IR/ROXICODONE) 5 MG immediate release tablet Take 1 tablet (5 mg total) by mouth every 6 (six) hours as needed for severe pain. Patient not taking: Reported on 12/12/2023 03/02/21   Alwyn Ren, MD  pantoprazole (PROTONIX) 40 MG tablet Take 1 tablet (40 mg total) by mouth 2 (two) times daily. Take 1 tablet twice daily x1 month, then 1 tablet daily thereafter. Patient not taking: Reported on 12/12/2023 12/22/21   Rodolph Bong, MD  saccharomyces boulardii (FLORASTOR) 250 MG capsule Take 1 capsule (250 mg total) by mouth 2 (two) times daily. Patient not taking: Reported on 12/12/2023 03/02/21   Alwyn Ren, MD  sucralfate (CARAFATE) 1 GM/10ML suspension Take 10 mLs (1 g total) by mouth 4 (four) times daily -  with meals and at bedtime. Patient not taking: Reported on 12/12/2023 12/22/21 12/12/23  Rodolph Bong, MD  triamcinolone (NASACORT) 55 MCG/ACT AERO nasal inhaler  USE 1 SPRAY IN [facility] NOSTRIL Patient not taking: Reported on 12/12/2023 07/13/21   Drema Halon, MD      Allergies    Patient has no known allergies.    Review of Systems   Review of Systems  All other systems reviewed and are negative.   Physical Exam Updated Vital Signs BP 128/74 (BP Location: Right Arm)   Pulse 87   Temp 98.2 F (36.8 C) (Oral)   Resp 18   Ht 5\' 3"  (1.6 m)   Wt 59 kg   SpO2 99%   BMI 23.03 kg/m  Physical Exam Vitals and nursing note reviewed.  Constitutional:      General: She is not in acute distress.    Appearance: Normal appearance. She is normal weight. She is not ill-appearing, toxic-appearing or diaphoretic.  HENT:     Head: Normocephalic and atraumatic.  Cardiovascular:     Rate and Rhythm: Normal rate and regular rhythm.     Heart sounds: Normal heart sounds.  Pulmonary:     Effort: Pulmonary effort is  normal. No respiratory distress.     Breath sounds: Normal breath sounds.  Abdominal:     General: Abdomen is flat.     Palpations: Abdomen is soft.     Tenderness: There is no abdominal tenderness.     Comments: Foley bag in place.  Musculoskeletal:        General: Normal range of motion.     Cervical back: Normal range of motion.  Skin:    General: Skin is warm and dry.  Neurological:     General: No focal deficit present.     Mental Status: She is alert.     Comments: Alert and oriented and neurologically intact without focal deficits  Psychiatric:        Mood and Affect: Mood normal.        Behavior: Behavior normal.     ED Results / Procedures / Treatments   Labs (all labs ordered are listed, but only abnormal results are displayed) Labs Reviewed  CBC - Abnormal; Notable for the following components:      Result Value   RBC 2.53 (*)    Hemoglobin 8.3 (*)    HCT 27.6 (*)    MCV 109.1 (*)    All other components within normal limits  URINALYSIS, ROUTINE W REFLEX MICROSCOPIC - Abnormal; Notable for the following  components:   APPearance TURBID (*)    Protein, ur 100 (*)    Leukocytes,Ua LARGE (*)    Bacteria, UA FEW (*)    All other components within normal limits  COMPREHENSIVE METABOLIC PANEL - Abnormal; Notable for the following components:   CO2 20 (*)    Glucose, Bld 291 (*)    BUN 51 (*)    Creatinine, Ser 1.67 (*)    Calcium 8.7 (*)    Total Protein 5.9 (*)    Albumin 2.8 (*)    GFR, Estimated 34 (*)    All other components within normal limits  CBG MONITORING, ED - Abnormal; Notable for the following components:   Glucose-Capillary 41 (*)    All other components within normal limits  CBG MONITORING, ED - Abnormal; Notable for the following components:   Glucose-Capillary 122 (*)    All other components within normal limits  CBG MONITORING, ED - Abnormal; Notable for the following components:   Glucose-Capillary 294 (*)    All other components within normal limits  URINE CULTURE  CBG MONITORING, ED    EKG None  Radiology No results found.  Procedures Procedures    Medications Ordered in ED Medications - No data to display  ED Course/ Medical Decision Making/ A&P                                 Medical Decision Making Amount and/or Complexity of Data Reviewed Labs: ordered.   This patient is a 62 y.o. female who presents to the ED for concern of hypoglycemia, this involves an extensive number of treatment options, and is a complaint that carries with it a high risk of complications and morbidity. The emergent differential diagnosis prior to evaluation includes, but is not limited to,  poor oral intake, sepsis, UTI . This is not an exhaustive differential.   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  Additional history: Chart reviewed.  Physical Exam: Physical exam performed. The pertinent findings include: Patient without complaints, alert and oriented and neurologically intact without focal deficits  Lab Tests: I ordered, and personally interpreted labs.  The pertinent results include:  Initial CBG 41 --> 122 --> 294.  Hemoglobin at baseline, likely due to patient's CKD.  UA not obviously infectious, culture pending.  Kidney function at baseline.  Consultations Obtained: Diabetes coordinator I seen and evaluated the patient, she does not feel that the patient's Dexcom is reading accurately as the patient's readings are approximately 100 higher than her fingerstick glucose.  Her low alarm was increased and recommendations were given and appreciated.   Disposition: After consideration of the diagnostic results and the patients response to treatment, I feel that emergency department workup does not suggest an emergent condition requiring admission or immediate intervention beyond what has been performed at this time. The plan is: Discharge with close outpatient follow-up and return precautions.  Patient's has not had any recurrence in her hypoglycemia after 4 hours of monitoring.  She has continued to be able to eat and drink without issue.  She continues to be asymptomatic.  She feels ready to go home.  No signs of sepsis.  Her episode of hypoglycemia is likely due to her not eating breakfast this morning.  I did discuss switching to fingerstick glucose checks given that her doctor Dexcom is not reading accurately.  She will need this to be discussed through her primary doctor.  Patient expressed understanding and is in agreement with this plan.   Evaluation and diagnostic testing in the emergency department does not suggest an emergent condition requiring admission or immediate intervention beyond what has been performed at this time.  Plan for discharge with close PCP follow-up.  Patient is understanding and amenable with plan, educated on red flag symptoms that would prompt immediate return.  Patient discharged in stable condition.  Final Clinical Impression(s) / ED Diagnoses Final diagnoses:  Hypoglycemia    Rx / DC Orders ED Discharge Orders     None     An After Visit Summary was printed and given to the patient.     Vear Clock 12/12/23 1602    Tegeler, Canary Brim, MD 12/12/23 [phone]

## 2023-12-12 NOTE — Inpatient Diabetes Management (Addendum)
Inpatient Diabetes Program Recommendations  AACE/ADA: New Consensus Statement on Inpatient Glycemic Control (2015)  Target Ranges:  Prepandial:   less than 140 mg/dL      Peak postprandial:   less than 180 mg/dL (1-2 hours)      Critically ill patients:  140 - 180 mg/dL   Lab Results  Component Value Date   GLUCAP 122 (H) 12/12/2023   HGBA1C 6.3 (H) 12/18/2021    Review of Glycemic Control  Diabetes history: T1D Outpatient Diabetes medications:  Omnipod 5 insulin pump with Dexcom G6 Current Basal--total 7.05 units/24 ICR-1 units for every 15 carb Insulin sensitivity factor-60 mg/dL (1 unit drops her 60 points Tartgetr BG-12am-8am--130                        8am-5pm-140                        5pm-MN-130 Current orders for Inpatient glycemic control: none  Spoke with patient and spouse at bedside.  She woke up this morning and was drinking her coffee; had not eaten anything yet.  She got tired and laid her head down on the table and that's the last thing she remembers.  Her husband states he checked her Dexcom reading and it was 120 mg/dL.  He checked her finger with her Accu Chek and it was 60 mg/dL.    She is current with Dr. Shawnee Knapp for Endocrinology.  On 10/30 his note has pump settings as follows:  Basal--1.5/hr--36 unit total Insulin carb ratio 1 units for every 15 carbs Insulin sensitivity 60  I reviewed her pump settings and they are as follows- She does plug in her carbs when she eats.    urrent Basal--total 7.05 units/24 ICR-1 units for every 15 carb Insulin sensitivity factor-60 mg/dL (1 unit drops her 60 points Tartgetr BG-12am-8am--130                        8am-5pm-140                        5pm-MN-130  I explained that when her glucose is dropping quickly her Dexcom will lag behind and may not alert her in time.  I increased her low alarm from 75 mg/dL to 161 mg/dl.  If she is 100 mg/dL with an arrow going down she should have a 15 gram carb snack or 4 oz juice.   She states she treats with 2 fun size candy bars.    She also states she decreased her basal setting about 2 weeks ago because she was having lows.  Asked her to call Dr. Shawnee Knapp before making adjustments and call him today regarding ED visit.  She verbalizes understanding.  If she is admitted, please use glycemic control order set using Novolog 0-6 units TID and 0-5 units at bedtime, Semglee 10 units every day.  Will continue to follow while inpatient.  Thank you, Dulce Sellar, MSN, CDCES Diabetes Coordinator Inpatient Diabetes Program [phone] (team pager from 8a-5p)

## 2023-12-12 NOTE — ED Notes (Signed)
Provided pt oj with 3 sugar packets

## 2023-12-12 NOTE — Discharge Instructions (Signed)
As we discussed, your workup in the ER today was reassuring for acute findings.  Your sugar was initially low, however it has not dropped again in the 4 hours that you have been here.  Given that you feel well, it is reasonable for you go home.  Given that your Dexcom is not working accurately, I recommend switching to fingerstick glucose checks in the interim until you can recalibrate or fix the issue with your Dexcom.  Please call your primary doctor to schedule follow-up appointment at your earliest convenience.  Please ensure that you are eating several small meals a day to ensure this does not happen again.  Return if development of any new or worsening symptoms.

## 2023-12-12 NOTE — ED Triage Notes (Signed)
"  From home, called out for hypoglycemia, blood sugar was 58 on our arrival and patient was disoriented and lethargic. Her husband said she didn't eat this morning and he had taken her insulin pump off 30 minutes before our arrival. Gave 16 grams of D10, she is now A&O x 4 on arrival. Is a kidney transplant patient with home foley catheter in place, spouse said he was worried she may have a UTI. Patient denies symptoms" per EMS

## 2023-12-13 LAB — URINE CULTURE

## 2023-12-18 ENCOUNTER — Emergency Department (HOSPITAL_COMMUNITY)
Admission: EM | Admit: 2023-12-18 | Discharge: 2023-12-18 | Disposition: A | Discharge status: Home / Self Care | Payer: BC Managed Care – PPO | Attending: Emergency Medicine | Admitting: Emergency Medicine

## 2023-12-18 ENCOUNTER — Encounter (HOSPITAL_COMMUNITY): Payer: Self-pay

## 2023-12-18 DIAGNOSIS — Z79899 Other long term (current) drug therapy: Secondary | ICD-10-CM | POA: Diagnosis not present

## 2023-12-18 DIAGNOSIS — I10 Essential (primary) hypertension: Secondary | ICD-10-CM | POA: Insufficient documentation

## 2023-12-18 DIAGNOSIS — E1040 Type 1 diabetes mellitus with diabetic neuropathy, unspecified: Secondary | ICD-10-CM | POA: Diagnosis not present

## 2023-12-18 DIAGNOSIS — Y69 Unspecified misadventure during surgical and medical care: Secondary | ICD-10-CM | POA: Insufficient documentation

## 2023-12-18 DIAGNOSIS — R339 Retention of urine, unspecified: Secondary | ICD-10-CM | POA: Diagnosis present

## 2023-12-18 DIAGNOSIS — T83098A Other mechanical complication of other indwelling urethral catheter, initial encounter: Secondary | ICD-10-CM | POA: Insufficient documentation

## 2023-12-18 DIAGNOSIS — Z794 Long term (current) use of insulin: Secondary | ICD-10-CM | POA: Insufficient documentation

## 2023-12-18 DIAGNOSIS — E039 Hypothyroidism, unspecified: Secondary | ICD-10-CM | POA: Diagnosis not present

## 2023-12-18 DIAGNOSIS — T839XXA Unspecified complication of genitourinary prosthetic device, implant and graft, initial encounter: Secondary | ICD-10-CM

## 2023-12-18 LAB — CBC
HCT: 33.6 % — ABNORMAL LOW (ref 36.0–46.0)
Hemoglobin: 10.8 g/dL — ABNORMAL LOW (ref 12.0–15.0)
MCH: 33.8 pg (ref 26.0–34.0)
MCHC: 32.1 g/dL (ref 30.0–36.0)
MCV: 105 fL — ABNORMAL HIGH (ref 80.0–100.0)
Platelets: 273 10*3/uL (ref 150–400)
RBC: 3.2 MIL/uL — ABNORMAL LOW (ref 3.87–5.11)
RDW: 14.8 % (ref 11.5–15.5)
WBC: 10.4 10*3/uL (ref 4.0–10.5)
nRBC: 0 % (ref 0.0–0.2)

## 2023-12-18 LAB — BASIC METABOLIC PANEL
Anion gap: 8 (ref 5–15)
BUN: 66 mg/dL — ABNORMAL HIGH (ref 8–23)
CO2: 18 mmol/L — ABNORMAL LOW (ref 22–32)
Calcium: 9.6 mg/dL (ref 8.9–10.3)
Chloride: 111 mmol/L (ref 98–111)
Creatinine, Ser: 1.73 mg/dL — ABNORMAL HIGH (ref 0.44–1.00)
GFR, Estimated: 33 mL/min — ABNORMAL LOW (ref >60)
Glucose, Bld: 151 mg/dL — ABNORMAL HIGH (ref 70–99)
Potassium: 5.2 mmol/L — ABNORMAL HIGH (ref 3.5–5.1)
Sodium: 137 mmol/L (ref 135–145)

## 2023-12-18 NOTE — Discharge Instructions (Signed)
Follow-up with your urologist as needed. °

## 2023-12-18 NOTE — ED Triage Notes (Signed)
Pt arrives via POV. Pt reports her foley catheter is clogged and believes she may be retaining urine. Foley was placed 2.5weeks ago. Pt AxOx4. Reports some lower abdominal pain.

## 2023-12-18 NOTE — ED Provider Notes (Signed)
[place] EMERGENCY DEPARTMENT AT [facility] Provider Note   CSN: [id number] Arrival date & time: 12/18/23  1542     History  Chief Complaint  Patient presents with   Urinary Retention    [name] is a 62 y.o. female.  HPI Patient complaining of urinary retention.  Decreased urination through catheter last night.  Has had an overall for 3 years but this 1 replaced 2 and half weeks ago.  Lower abdominal pain.  No fevers.   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   DDD (degenerative disc disease), lumbar    Diabetes mellitus without complication (HCC)    type 1   Diabetic ketoacidosis (HCC) 09/26/2020   Gait disorder    neurogenic   Gangrene of right foot (HCC)    Hypertension    Hypothyroid 12/08/2020   Neuropathy    Renal disease    Sacral decubitus ulcer, stage IV (HCC) 12/08/2020   Subacute osteomyelitis of right foot (HCC)     Home Medications Prior to Admission medications   Medication Sig Start Date End Date Taking? Authorizing Provider  acetaminophen (TYLENOL) 325 MG tablet Take 2 tablets (650 mg total) by mouth every 6 (six) hours as needed for mild pain (or Fever >/= 101). Patient not taking: Reported on 12/12/2023 10/06/20   Rhetta Mura, MD  ascorbic acid (VITAMIN C) 250 MG tablet Take 1 tablet (250 mg total) by mouth 2 (two) times daily. Patient not taking: Reported on 12/12/2023 12/22/21   Rodolph Bong, MD  atorvastatin (LIPITOR) 10 MG tablet Take 1 tablet (10 mg total) by mouth daily. Patient not taking: Reported on 12/12/2023 03/02/21   Alwyn Ren, MD  azaTHIOprine (IMURAN) 50 MG tablet Take 1 tablet (50 mg total) by mouth daily. 03/02/21   Alwyn Ren, MD  cephALEXin (KEFLEX) 500 MG capsule Take 1 capsule (500 mg total) by mouth 3 (three) times daily. Patient not taking:  Reported on 12/12/2023 07/31/23   Jacalyn Lefevre, MD  cholestyramine Lanetta Inch) 4 g packet Take 1 packet (4 g total) by mouth 2 (two) times daily. Patient not taking: Reported on 12/12/2023 03/02/21   Alwyn Ren, MD  collagenase (SANTYL) ointment Apply topically daily. Patient not taking: Reported on 12/12/2023 03/02/21   Alwyn Ren, MD  cycloSPORINE modified (NEORAL) 25 MG capsule Take 75 mg by mouth in the morning and at bedtime. 12/13/21   [provider]  ferrous sulfate 325 (65 FE) MG tablet Take 325 mg by mouth daily with breakfast.    [provider]  HYDROcodone bit-homatropine (HYCODAN) 5-1.5 MG/5ML syrup Take 5 mLs by mouth every 6 (six) hours as needed for cough. Patient not taking: Reported on 12/12/2023 12/22/21   Rodolph Bong, MD  insulin lispro (HUMALOG) 100 UNIT/ML injection Inject 0.02 mLs (2 Units total) into the skin 3 (three) times daily before meals. Sliding scale if 200-250=2 units;251-300=4units;301-350=6 units;351-400=8 units;104-450=10 units;451-500=12 units subcutaneously before meals and at bedtime for DM Patient not taking: Reported on 12/12/2023 03/02/21   Alwyn Ren, MD  levothyroxine (SYNTHROID) 100 MCG tablet Take 100 mcg by mouth daily before breakfast.    [provider]  levothyroxine (SYNTHROID) 88 MCG tablet Take 1 tablet (88 mcg total) by mouth daily before breakfast. Patient not taking: Reported on 12/12/2023 03/02/21   Alwyn Ren, MD  lisinopril (ZESTRIL) 5 MG tablet Take 5 mg by mouth at bedtime. 12/19/19   [provider]  liver oil-zinc oxide (DESITIN) 40 % ointment Apply topically as needed for irritation. Patient not taking: Reported on 12/12/2023 03/02/21   Alwyn Ren, MD  loperamide (IMODIUM) 2 MG capsule Take 1 capsule (2 mg total) by mouth as needed for diarrhea or loose stools. 03/02/21   Alwyn Ren, MD  NOVOLOG 100 UNIT/ML injection Inject into the skin See  admin instructions. Per Omnipod insulin pump    [provider]  ondansetron (ZOFRAN) 4 MG tablet Take 1 tablet (4 mg total) by mouth every 6 (six) hours as needed for nausea or vomiting. Patient not taking: Reported on 12/12/2023 06/26/22   Al Decant, PA-C  ondansetron (ZOFRAN-ODT) 4 MG disintegrating tablet Take 1 tablet (4 mg total) by mouth every 4 (four) hours as needed for nausea or vomiting. Patient not taking: Reported on 12/12/2023 03/02/21   Alwyn Ren, MD  oxyCODONE (OXY IR/ROXICODONE) 5 MG immediate release tablet Take 1 tablet (5 mg total) by mouth every 6 (six) hours as needed for severe pain. Patient not taking: Reported on 12/12/2023 03/02/21   Alwyn Ren, MD  pantoprazole (PROTONIX) 40 MG tablet Take 1 tablet (40 mg total) by mouth 2 (two) times daily. Take 1 tablet twice daily x1 month, then 1 tablet daily thereafter. Patient not taking: Reported on 12/12/2023 12/22/21   Rodolph Bong, MD  predniSONE (DELTASONE) 5 MG tablet Take 1 tablet (5 mg total) by mouth daily with breakfast. 03/02/21   Alwyn Ren, MD  saccharomyces boulardii (FLORASTOR) 250 MG capsule Take 1 capsule (250 mg total) by mouth 2 (two) times daily. Patient not taking: Reported on 12/12/2023 03/02/21   Alwyn Ren, MD  sucralfate (CARAFATE) 1 GM/10ML suspension Take 10 mLs (1 g total) by mouth 4 (four) times daily -  with meals and at bedtime. Patient not taking: Reported on 12/12/2023 12/22/21 12/12/23  Rodolph Bong, MD  triamcinolone (NASACORT) 55 MCG/ACT AERO nasal inhaler USE 1 SPRAY IN [facility] NOSTRIL Patient not taking: Reported on 12/12/2023 07/13/21   Drema Halon, MD      Allergies    Patient has no known allergies.    Review of Systems   Review of Systems  Physical Exam Updated Vital Signs BP 133/70   Pulse 89   Temp 98.4 F (36.9 C) (Oral)   Resp 16   SpO2 99%  Physical Exam Vitals and nursing note reviewed.  Abdominal:      Tenderness: There is abdominal tenderness.     Comments: Suprapubic tenderness.  Genitourinary:    Comments: Foley catheter in place.  Mostly empty bag and tubing. Skin:    General: Skin is warm.  Neurological:     Mental Status: She is alert and oriented to person, place, and time.     ED Results / Procedures / Treatments   Labs (all labs ordered are listed, but only abnormal results are displayed) Labs Reviewed  BASIC METABOLIC PANEL - Abnormal; Notable for the following components:      Result Value   Potassium 5.2 (*)    CO2 18 (*)    Glucose, Bld 151 (*)    BUN 66 (*)    Creatinine, Ser 1.73 (*)    GFR, Estimated 33 (*)    All other components within normal limits  CBC - Abnormal; Notable for the following components:   RBC 3.20 (*)  Hemoglobin 10.8 (*)    HCT 33.6 (*)    MCV 105.0 (*)    All other components within normal limits    EKG None  Radiology No results found.  Procedures Procedures    Medications Ordered in ED Medications - No data to display  ED Course/ Medical Decision Making/ A&P                                 Medical Decision Making Amount and/or Complexity of Data Reviewed Labs: ordered.   Patient with potential urinary retention.  Foley catheter in place.  Will replace Foley catheter.  Since may have had decreased urination since last night will get kidney function. Feels much better after Foley replacement.  Creatinine mildly increased.  Should improve after removal of the obstruction.  Will discharge home.        Final Clinical Impression(s) / ED Diagnoses Final diagnoses:  Complication of Foley catheter, initial encounter [facility])    Rx / DC Orders ED Discharge Orders     None         Benjiman Core, MD 12/18/23 2316

## 2024-01-09 ENCOUNTER — Inpatient Hospital Stay (HOSPITAL_COMMUNITY)
Admission: EM | Admit: 2024-01-09 | Discharge: 2024-01-10 | DRG: 698 | Disposition: A | Discharge status: Home / Self Care | Payer: BC Managed Care – PPO | Attending: Internal Medicine | Admitting: Internal Medicine

## 2024-01-09 ENCOUNTER — Other Ambulatory Visit: Payer: Self-pay

## 2024-01-09 ENCOUNTER — Emergency Department (HOSPITAL_COMMUNITY): Payer: BC Managed Care – PPO

## 2024-01-09 ENCOUNTER — Encounter (HOSPITAL_COMMUNITY): Payer: Self-pay

## 2024-01-09 DIAGNOSIS — E1069 Type 1 diabetes mellitus with other specified complication: Secondary | ICD-10-CM | POA: Diagnosis present

## 2024-01-09 DIAGNOSIS — E875 Hyperkalemia: Secondary | ICD-10-CM | POA: Diagnosis present

## 2024-01-09 DIAGNOSIS — T83031A Leakage of indwelling urethral catheter, initial encounter: Secondary | ICD-10-CM | POA: Diagnosis present

## 2024-01-09 DIAGNOSIS — L89129 Pressure ulcer of left upper back, unspecified stage: Secondary | ICD-10-CM | POA: Diagnosis present

## 2024-01-09 DIAGNOSIS — A419 Sepsis, unspecified organism: Principal | ICD-10-CM | POA: Diagnosis present

## 2024-01-09 DIAGNOSIS — Z794 Long term (current) use of insulin: Secondary | ICD-10-CM

## 2024-01-09 DIAGNOSIS — E1022 Type 1 diabetes mellitus with diabetic chronic kidney disease: Secondary | ICD-10-CM | POA: Diagnosis present

## 2024-01-09 DIAGNOSIS — L89153 Pressure ulcer of sacral region, stage 3: Secondary | ICD-10-CM | POA: Diagnosis present

## 2024-01-09 DIAGNOSIS — D631 Anemia in chronic kidney disease: Secondary | ICD-10-CM | POA: Diagnosis present

## 2024-01-09 DIAGNOSIS — Z66 Do not resuscitate: Secondary | ICD-10-CM | POA: Diagnosis present

## 2024-01-09 DIAGNOSIS — E1040 Type 1 diabetes mellitus with diabetic neuropathy, unspecified: Secondary | ICD-10-CM | POA: Diagnosis present

## 2024-01-09 DIAGNOSIS — Z1152 Encounter for screening for COVID-19: Secondary | ICD-10-CM | POA: Diagnosis not present

## 2024-01-09 DIAGNOSIS — M51369 Other intervertebral disc degeneration, lumbar region without mention of lumbar back pain or lower extremity pain: Secondary | ICD-10-CM | POA: Diagnosis present

## 2024-01-09 DIAGNOSIS — E785 Hyperlipidemia, unspecified: Secondary | ICD-10-CM | POA: Diagnosis present

## 2024-01-09 DIAGNOSIS — T83511A Infection and inflammatory reaction due to indwelling urethral catheter, initial encounter: Principal | ICD-10-CM | POA: Diagnosis present

## 2024-01-09 DIAGNOSIS — E109 Type 1 diabetes mellitus without complications: Secondary | ICD-10-CM | POA: Diagnosis present

## 2024-01-09 DIAGNOSIS — B952 Enterococcus as the cause of diseases classified elsewhere: Secondary | ICD-10-CM | POA: Diagnosis present

## 2024-01-09 DIAGNOSIS — I129 Hypertensive chronic kidney disease with stage 1 through stage 4 chronic kidney disease, or unspecified chronic kidney disease: Secondary | ICD-10-CM | POA: Diagnosis present

## 2024-01-09 DIAGNOSIS — Z940 Kidney transplant status: Secondary | ICD-10-CM | POA: Diagnosis not present

## 2024-01-09 DIAGNOSIS — L8990 Pressure ulcer of unspecified site, unspecified stage: Secondary | ICD-10-CM

## 2024-01-09 DIAGNOSIS — N1831 Chronic kidney disease, stage 3a: Secondary | ICD-10-CM | POA: Diagnosis present

## 2024-01-09 DIAGNOSIS — Z7952 Long term (current) use of systemic steroids: Secondary | ICD-10-CM

## 2024-01-09 DIAGNOSIS — T83098A Other mechanical complication of other indwelling urethral catheter, initial encounter: Secondary | ICD-10-CM | POA: Diagnosis present

## 2024-01-09 DIAGNOSIS — B964 Proteus (mirabilis) (morganii) as the cause of diseases classified elsewhere: Secondary | ICD-10-CM | POA: Diagnosis present

## 2024-01-09 DIAGNOSIS — L89119 Pressure ulcer of right upper back, unspecified stage: Secondary | ICD-10-CM | POA: Diagnosis present

## 2024-01-09 DIAGNOSIS — Z89511 Acquired absence of right leg below knee: Secondary | ICD-10-CM

## 2024-01-09 DIAGNOSIS — E039 Hypothyroidism, unspecified: Secondary | ICD-10-CM | POA: Diagnosis present

## 2024-01-09 DIAGNOSIS — Z1629 Resistance to other single specified antibiotic: Secondary | ICD-10-CM | POA: Diagnosis present

## 2024-01-09 DIAGNOSIS — K2101 Gastro-esophageal reflux disease with esophagitis, with bleeding: Secondary | ICD-10-CM | POA: Diagnosis present

## 2024-01-09 DIAGNOSIS — Z8249 Family history of ischemic heart disease and other diseases of the circulatory system: Secondary | ICD-10-CM

## 2024-01-09 DIAGNOSIS — R410 Disorientation, unspecified: Secondary | ICD-10-CM | POA: Diagnosis present

## 2024-01-09 DIAGNOSIS — Z978 Presence of other specified devices: Secondary | ICD-10-CM | POA: Diagnosis not present

## 2024-01-09 DIAGNOSIS — N390 Urinary tract infection, site not specified: Secondary | ICD-10-CM | POA: Diagnosis present

## 2024-01-09 DIAGNOSIS — A4159 Other Gram-negative sepsis: Secondary | ICD-10-CM | POA: Diagnosis present

## 2024-01-09 DIAGNOSIS — M4628 Osteomyelitis of vertebra, sacral and sacrococcygeal region: Secondary | ICD-10-CM | POA: Diagnosis present

## 2024-01-09 DIAGNOSIS — Z79624 Long term (current) use of inhibitors of nucleotide synthesis: Secondary | ICD-10-CM

## 2024-01-09 DIAGNOSIS — Z8619 Personal history of other infectious and parasitic diseases: Secondary | ICD-10-CM

## 2024-01-09 DIAGNOSIS — Z79899 Other long term (current) drug therapy: Secondary | ICD-10-CM

## 2024-01-09 DIAGNOSIS — Z9641 Presence of insulin pump (external) (internal): Secondary | ICD-10-CM | POA: Diagnosis present

## 2024-01-09 DIAGNOSIS — Z79890 Hormone replacement therapy (postmenopausal): Secondary | ICD-10-CM

## 2024-01-09 DIAGNOSIS — N1832 Chronic kidney disease, stage 3b: Secondary | ICD-10-CM | POA: Diagnosis present

## 2024-01-09 LAB — CBC WITH DIFFERENTIAL/PLATELET
Abs Immature Granulocytes: 0.03 10*3/uL (ref 0.00–0.07)
Basophils Absolute: 0 10*3/uL (ref 0.0–0.1)
Basophils Relative: 0 %
Eosinophils Absolute: 0.2 10*3/uL (ref 0.0–0.5)
Eosinophils Relative: 2 %
HCT: 34.2 % — ABNORMAL LOW (ref 36.0–46.0)
Hemoglobin: 10.5 g/dL — ABNORMAL LOW (ref 12.0–15.0)
Immature Granulocytes: 0 %
Lymphocytes Relative: 15 %
Lymphs Abs: 1.5 10*3/uL (ref 0.7–4.0)
MCH: 33 pg (ref 26.0–34.0)
MCHC: 30.7 g/dL (ref 30.0–36.0)
MCV: 107.5 fL — ABNORMAL HIGH (ref 80.0–100.0)
Monocytes Absolute: 0.8 10*3/uL (ref 0.1–1.0)
Monocytes Relative: 9 %
Neutro Abs: 7.3 10*3/uL (ref 1.7–7.7)
Neutrophils Relative %: 74 %
Platelets: 193 10*3/uL (ref 150–400)
RBC: 3.18 MIL/uL — ABNORMAL LOW (ref 3.87–5.11)
RDW: 14.7 % (ref 11.5–15.5)
WBC: 9.9 10*3/uL (ref 4.0–10.5)
nRBC: 0 % (ref 0.0–0.2)

## 2024-01-09 LAB — COMPREHENSIVE METABOLIC PANEL
ALT: 16 U/L (ref 0–44)
AST: 30 U/L (ref 15–41)
Albumin: 3.4 g/dL — ABNORMAL LOW (ref 3.5–5.0)
Alkaline Phosphatase: 78 U/L (ref 38–126)
Anion gap: 5 (ref 5–15)
BUN: 61 mg/dL — ABNORMAL HIGH (ref 8–23)
CO2: 19 mmol/L — ABNORMAL LOW (ref 22–32)
Calcium: 9 mg/dL (ref 8.9–10.3)
Chloride: 111 mmol/L (ref 98–111)
Creatinine, Ser: 1.73 mg/dL — ABNORMAL HIGH (ref 0.44–1.00)
GFR, Estimated: 33 mL/min — ABNORMAL LOW (ref >60)
Glucose, Bld: 94 mg/dL (ref 70–99)
Potassium: 5.4 mmol/L — ABNORMAL HIGH (ref 3.5–5.1)
Sodium: 135 mmol/L (ref 135–145)
Total Bilirubin: 0.8 mg/dL (ref 0.0–1.2)
Total Protein: 7.1 g/dL (ref 6.5–8.1)

## 2024-01-09 LAB — URINALYSIS, W/ REFLEX TO CULTURE (INFECTION SUSPECTED)
Bilirubin Urine: NEGATIVE
Glucose, UA: NEGATIVE mg/dL
Hgb urine dipstick: NEGATIVE
Ketones, ur: NEGATIVE mg/dL
Nitrite: NEGATIVE
Protein, ur: >=300 mg/dL — AB
Specific Gravity, Urine: 1.013 (ref 1.005–1.030)
pH: 8 (ref 5.0–8.0)

## 2024-01-09 LAB — URINALYSIS, ROUTINE W REFLEX MICROSCOPIC
Bilirubin Urine: NEGATIVE
Glucose, UA: NEGATIVE mg/dL
Ketones, ur: NEGATIVE mg/dL
Nitrite: NEGATIVE
Protein, ur: 100 mg/dL — AB
Specific Gravity, Urine: 1.009 (ref 1.005–1.030)
pH: 7 (ref 5.0–8.0)

## 2024-01-09 LAB — PROTIME-INR
INR: 1 (ref 0.8–1.2)
Prothrombin Time: 13.6 s (ref 11.4–15.2)

## 2024-01-09 LAB — CREATININE, SERUM
Creatinine, Ser: 1.74 mg/dL — ABNORMAL HIGH (ref 0.44–1.00)
GFR, Estimated: 32 mL/min — ABNORMAL LOW (ref >60)

## 2024-01-09 LAB — CBG MONITORING, ED
Glucose-Capillary: 83 mg/dL (ref 70–99)
Glucose-Capillary: 63 mg/dL — ABNORMAL LOW (ref 70–99)
Glucose-Capillary: 96 mg/dL (ref 70–99)

## 2024-01-09 LAB — I-STAT CG4 LACTIC ACID, ED
Lactic Acid, Venous: 0.9 mmol/L (ref 0.5–1.9)
Lactic Acid, Venous: 1.5 mmol/L (ref 0.5–1.9)

## 2024-01-09 LAB — RESP PANEL BY RT-PCR (RSV, FLU A&B, COVID)  RVPGX2
Influenza A by PCR: NEGATIVE
Influenza B by PCR: NEGATIVE
Resp Syncytial Virus by PCR: NEGATIVE
SARS Coronavirus 2 by RT PCR: NEGATIVE

## 2024-01-09 LAB — HIV ANTIBODY (ROUTINE TESTING W REFLEX): HIV Screen 4th Generation wRfx: NONREACTIVE

## 2024-01-09 LAB — TROPONIN I (HIGH SENSITIVITY): Troponin I (High Sensitivity): 6 ng/L (ref <18)

## 2024-01-09 MED ORDER — SODIUM CHLORIDE 0.9 % IV SOLN
1.0000 g | INTRAVENOUS | Status: DC
  Administered 2024-01-10: 1 g via INTRAVENOUS
  Filled 2024-01-09: qty 10

## 2024-01-09 MED ORDER — ACETAMINOPHEN 650 MG RE SUPP: 650.0000 mg | Freq: Four times a day (QID) | RECTAL | Status: DC | PRN

## 2024-01-09 MED ORDER — PREDNISONE 5 MG PO TABS
5.0000 mg | ORAL_TABLET | Freq: Every day | ORAL | Status: DC
  Administered 2024-01-10: 5 mg via ORAL
  Filled 2024-01-09: qty 1

## 2024-01-09 MED ORDER — SODIUM ZIRCONIUM CYCLOSILICATE 5 G PO PACK
5.0000 g | PACK | Freq: Every day | ORAL | Status: DC
  Administered 2024-01-09 – 2024-01-10 (×2): 5 g via ORAL
  Filled 2024-01-09 (×2): qty 1

## 2024-01-09 MED ORDER — ATORVASTATIN CALCIUM 10 MG PO TABS: 10.0000 mg | ORAL_TABLET | Freq: Every day | ORAL | Status: DC

## 2024-01-09 MED ORDER — PANTOPRAZOLE SODIUM 40 MG PO TBEC
40.0000 mg | DELAYED_RELEASE_TABLET | Freq: Two times a day (BID) | ORAL | Status: DC
  Administered 2024-01-09 – 2024-01-10 (×2): 40 mg via ORAL
  Filled 2024-01-09 (×2): qty 1

## 2024-01-09 MED ORDER — SODIUM CHLORIDE 0.9% FLUSH
3.0000 mL | Freq: Two times a day (BID) | INTRAVENOUS | Status: DC
  Administered 2024-01-09 – 2024-01-10 (×2): 3 mL via INTRAVENOUS

## 2024-01-09 MED ORDER — ENOXAPARIN SODIUM 30 MG/0.3ML IJ SOSY
30.0000 mg | PREFILLED_SYRINGE | INTRAMUSCULAR | Status: DC
  Administered 2024-01-10: 30 mg via SUBCUTANEOUS
  Filled 2024-01-09: qty 0.3

## 2024-01-09 MED ORDER — INSULIN ASPART 100 UNIT/ML IJ SOLN
2.0000 [IU] | Freq: Three times a day (TID) | INTRAMUSCULAR | Status: DC
  Administered 2024-01-10: 2 [IU] via SUBCUTANEOUS
  Filled 2024-01-09: qty 0.02

## 2024-01-09 MED ORDER — CYCLOSPORINE MODIFIED (NEORAL) 25 MG PO CAPS
75.0000 mg | ORAL_CAPSULE | Freq: Two times a day (BID) | ORAL | Status: DC
  Administered 2024-01-09 – 2024-01-10 (×2): 75 mg via ORAL
  Filled 2024-01-09 (×3): qty 3

## 2024-01-09 MED ORDER — ACETAMINOPHEN 325 MG PO TABS
650.0000 mg | ORAL_TABLET | Freq: Four times a day (QID) | ORAL | Status: DC | PRN
  Administered 2024-01-09: 650 mg via ORAL
  Filled 2024-01-09: qty 2

## 2024-01-09 MED ORDER — POLYETHYLENE GLYCOL 3350 17 G PO PACK: 17.0000 g | PACK | Freq: Every day | ORAL | Status: DC | PRN

## 2024-01-09 MED ORDER — SODIUM CHLORIDE 0.9 % IV BOLUS
1000.0000 mL | Freq: Once | INTRAVENOUS | Status: AC
  Administered 2024-01-09: 1000 mL via INTRAVENOUS

## 2024-01-09 MED ORDER — AZATHIOPRINE 50 MG PO TABS
50.0000 mg | ORAL_TABLET | Freq: Every day | ORAL | Status: DC
  Administered 2024-01-10: 50 mg via ORAL
  Filled 2024-01-09: qty 1

## 2024-01-09 MED ORDER — INSULIN ASPART 100 UNIT/ML IJ SOLN
0.0000 [IU] | Freq: Three times a day (TID) | INTRAMUSCULAR | Status: DC
Start: 2024-01-10 — End: 2024-01-10
  Filled 2024-01-09: qty 0.09

## 2024-01-09 MED ORDER — LEVOTHYROXINE SODIUM 100 MCG PO TABS
100.0000 ug | ORAL_TABLET | Freq: Every day | ORAL | Status: DC
  Administered 2024-01-10: 100 ug via ORAL
  Filled 2024-01-09: qty 1

## 2024-01-09 MED ORDER — ACETAMINOPHEN 325 MG PO TABS
650.0000 mg | ORAL_TABLET | Freq: Once | ORAL | Status: AC
  Administered 2024-01-09: 650 mg via ORAL
  Filled 2024-01-09: qty 2

## 2024-01-09 MED ORDER — INSULIN ASPART 100 UNIT/ML IJ SOLN
0.0000 [IU] | Freq: Every day | INTRAMUSCULAR | Status: DC
  Filled 2024-01-09: qty 0.05

## 2024-01-09 MED ORDER — SODIUM CHLORIDE 0.9 % IV BOLUS
1000.0000 mL | Freq: Once | INTRAVENOUS | Status: AC
Start: 2024-01-09 — End: 2024-01-09
  Administered 2024-01-09: 1000 mL via INTRAVENOUS

## 2024-01-09 MED ORDER — INSULIN PUMP
Freq: Three times a day (TID) | SUBCUTANEOUS | Status: DC
  Filled 2024-01-09: qty 1

## 2024-01-09 MED ORDER — SODIUM BICARBONATE 650 MG PO TABS
650.0000 mg | ORAL_TABLET | Freq: Two times a day (BID) | ORAL | Status: DC
  Administered 2024-01-09 – 2024-01-10 (×2): 650 mg via ORAL
  Filled 2024-01-09 (×3): qty 1

## 2024-01-09 MED ORDER — ENOXAPARIN SODIUM 40 MG/0.4ML IJ SOSY: 40.0000 mg | PREFILLED_SYRINGE | INTRAMUSCULAR | Status: DC

## 2024-01-09 MED ORDER — SODIUM CHLORIDE 0.9 % IV SOLN
1.0000 g | Freq: Once | INTRAVENOUS | Status: AC
  Administered 2024-01-09: 1 g via INTRAVENOUS
  Filled 2024-01-09: qty 10

## 2024-01-09 NOTE — H&P (Addendum)
 History and Physical    Patient: [name] FMW:[id number] DOB: [birth year] DOA: 01/09/2024 DOS: the patient was seen and examined on 01/09/2024 PCP: Gladystine Erminio CROME, MD  Patient coming from: Home  Chief Complaint:  Chief Complaint  Patient presents with   Urinary Tract Infection   HPI: [name] is a 63 y.o. female with medical history significant of reported neuropathy, diabetes mellitus type 1, insulin  pump, right below-knee amputation, generalized deconditioning, pressure ulceration of sacrum and upper back, chronic Foley.  Patient reports being in her usual state of health till last evening when patient's Foley reportedly got blocked and patient had suprapubic discomfort.  By this morning patient was starting to have shivers, although no fevers are documented and patient seemed more irritated/belligerent to husband who felt that the patient had altered mental status.  Patient was brought to [facility] long ER.  On arrival to the ER patient was noted to be tachycardic up to 110 and having fever up to 102 or more.  There is no report of patient having any cough shortness of breath ear pain nose pain rash on her skin vomiting diarrhea.  Patient is currently s/p Foley replacement ceftriaxone  and 2 L fluid.  Patient reports feeling great.  Husband at bedside feels that the patient's mentation is at baseline.  Medical evaluation is sought Review of Systems: As mentioned in the history of present illness. All other systems reviewed and are negative. 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   DDD (degenerative disc disease), lumbar    Diabetes mellitus without complication (HCC)    type 1   Diabetic ketoacidosis (HCC) 09/26/2020   Gait disorder    neurogenic   Gangrene of right foot (HCC)    Hypertension    Hypothyroid 12/08/2020   Neuropathy     Renal disease    Sacral decubitus ulcer, stage IV (HCC) 12/08/2020   Subacute osteomyelitis of right foot [facility])    Past Surgical History:  Procedure Laterality Date   AMPUTATION Right 10/01/2020   Procedure: RIGHT BELOW KNEE AMPUTATION;  Surgeon: Harden Jerona GAILS, MD;  Location: [facility] OR;  Service: Orthopedics;  Laterality: Right;   BLADDER SURGERY     ESOPHAGOGASTRODUODENOSCOPY (EGD) WITH PROPOFOL  N/A 11/10/2020   Procedure: ESOPHAGOGASTRODUODENOSCOPY (EGD) WITH PROPOFOL ;  Surgeon: Abran Norleen SAILOR, MD;  Location: [facility] ENDOSCOPY;  Service: Endoscopy;  Laterality: N/A;   IR GASTROSTOMY TUBE REMOVAL  03/25/2021   KIDNEY TRANSPLANT     LEG SURGERY     tib-fib fx , ORIF   Social History:  reports that she has never smoked. She has never used smokeless tobacco. She reports that she does not drink alcohol  and does not use drugs.  No Known Allergies  Family History  Problem Relation Age of Onset   Hypertension Mother    Hypertension Father     Prior to Admission medications   Medication Sig Start Date End Date Taking? Authorizing Provider  acetaminophen  (TYLENOL ) 325 MG tablet Take 2 tablets (650 mg total) by mouth every 6 (six) hours as needed for mild pain (or Fever >/= 101). Patient not taking: Reported on 12/12/2023 10/06/20   Samtani, Jai-Gurmukh, MD  ascorbic acid  (VITAMIN C) 250 MG tablet Take 1 tablet (250 mg total) by mouth 2 (two) times daily. Patient not taking: Reported on 12/12/2023 12/22/21   Sebastian Toribio GAILS, MD  atorvastatin  (  LIPITOR) 10 MG tablet Take 1 tablet (10 mg total) by mouth daily. Patient not taking: Reported on 12/12/2023 03/02/21   Will Almarie MATSU, MD  azaTHIOprine  (IMURAN ) 50 MG tablet Take 1 tablet (50 mg total) by mouth daily. 03/02/21   Will Almarie MATSU, MD  cephALEXin  (KEFLEX ) 500 MG capsule Take 1 capsule (500 mg total) by mouth 3 (three) times daily. Patient not taking: Reported on 12/12/2023 07/31/23   Dean Clarity, MD  cholestyramine  (QUESTRAN ) 4 g  packet Take 1 packet (4 g total) by mouth 2 (two) times daily. Patient not taking: Reported on 12/12/2023 03/02/21   Will Almarie MATSU, MD  collagenase  (SANTYL ) ointment Apply topically daily. Patient not taking: Reported on 12/12/2023 03/02/21   Will Almarie MATSU, MD  cycloSPORINE  modified (NEORAL ) 25 MG capsule Take 75 mg by mouth in the morning and at bedtime. 12/13/21   [provider]  ferrous sulfate 325 (65 FE) MG tablet Take 325 mg by mouth daily with breakfast.    [provider]  HYDROcodone  bit-homatropine (HYCODAN) 5-1.5 MG/5ML syrup Take 5 mLs by mouth every 6 (six) hours as needed for cough. Patient not taking: Reported on 12/12/2023 12/22/21   Sebastian Toribio GAILS, MD  insulin  lispro (HUMALOG ) 100 UNIT/ML injection Inject 0.02 mLs (2 Units total) into the skin 3 (three) times daily before meals. Sliding scale if 200-250=2 units;251-300=4units;301-350=6 units;351-400=8 units;104-450=10 units;451-500=12 units subcutaneously before meals and at bedtime for DM Patient not taking: Reported on 12/12/2023 03/02/21   Will Almarie MATSU, MD  levothyroxine  (SYNTHROID ) 100 MCG tablet Take 100 mcg by mouth daily before breakfast.    [provider]  levothyroxine  (SYNTHROID ) 88 MCG tablet Take 1 tablet (88 mcg total) by mouth daily before breakfast. Patient not taking: Reported on 12/12/2023 03/02/21   Will Almarie MATSU, MD  lisinopril  (ZESTRIL ) 5 MG tablet Take 5 mg by mouth at bedtime. 12/19/19   [provider]  liver oil-zinc  oxide (DESITIN) 40 % ointment Apply topically as needed for irritation. Patient not taking: Reported on 12/12/2023 03/02/21   Will Almarie MATSU, MD  loperamide  (IMODIUM ) 2 MG capsule Take 1 capsule (2 mg total) by mouth as needed for diarrhea or loose stools. 03/02/21   Will Almarie MATSU, MD  NOVOLOG  100 UNIT/ML injection Inject into the skin See admin instructions. Per Omnipod insulin  pump    [provider]  ondansetron   (ZOFRAN ) 4 MG tablet Take 1 tablet (4 mg total) by mouth every 6 (six) hours as needed for nausea or vomiting. Patient not taking: Reported on 12/12/2023 06/26/22   Ruthell Lonni FALCON, PA-C  ondansetron  (ZOFRAN -ODT) 4 MG disintegrating tablet Take 1 tablet (4 mg total) by mouth every 4 (four) hours as needed for nausea or vomiting. Patient not taking: Reported on 12/12/2023 03/02/21   Will Almarie MATSU, MD  oxyCODONE  (OXY IR/ROXICODONE ) 5 MG immediate release tablet Take 1 tablet (5 mg total) by mouth every 6 (six) hours as needed for severe pain. Patient not taking: Reported on 12/12/2023 03/02/21   Will Almarie MATSU, MD  pantoprazole  (PROTONIX ) 40 MG tablet Take 1 tablet (40 mg total) by mouth 2 (two) times daily. Take 1 tablet twice daily x1 month, then 1 tablet daily thereafter. Patient not taking: Reported on 12/12/2023 12/22/21   Sebastian Toribio GAILS, MD  predniSONE  (DELTASONE ) 5 MG tablet Take 1 tablet (5 mg total) by mouth daily with breakfast. 03/02/21   Will Almarie MATSU, MD  saccharomyces boulardii (FLORASTOR) 250 MG capsule Take 1 capsule (250  mg total) by mouth 2 (two) times daily. Patient not taking: Reported on 12/12/2023 03/02/21   Will Almarie MATSU, MD  sucralfate  (CARAFATE ) 1 GM/10ML suspension Take 10 mLs (1 g total) by mouth 4 (four) times daily -  with meals and at bedtime. Patient not taking: Reported on 12/12/2023 12/22/21 12/12/23  Sebastian Toribio GAILS, MD  triamcinolone (NASACORT) 55 MCG/ACT AERO nasal inhaler USE 1 SPRAY IN [facility] NOSTRIL Patient not taking: Reported on 12/12/2023 07/13/21   Ethyl Lonni BRAVO, MD    Physical Exam: Vitals:   01/09/24 1655 01/09/24 1721 01/09/24 1727 01/09/24 1728  BP:  (!) 156/82    Pulse: (!) 120 (!) 114 (!) 114   Resp:  16    Temp:    (!) 101.7 F (38.7 C)  TempSrc:    Oral  SpO2: 97% 99% 93%   Weight:      Height:       General: Patient is alert and awake, seems to have chronic left corneal scarring.  Gives a coherent  account of her symptoms, husband at bedside Respiratory exam: Bilateral intravesicular Cardiovascular exam S1-S2 normal Abdomen all quadrant soft nontender Foley in situ draining tan-colored urine. Presacral ulcer less than 2 cm across, subcu deep with semester in the base.  Not infected no spreading erythema or foul smell or marked tenderness. Upper back ulcer similarly less than 1 cm across appears as subcu deep.  Not infected Right BKA site dressed. Patient is deconditioned demonstrating poor antigravity strength of lower extremities.  Demonstrating poor ability to sit up in the bed. Data Reviewed:  Labs on Admission:  Results for orders placed or performed during the hospital encounter of 01/09/24 (from the past 24 hours)  Urinalysis, w/ Reflex to Culture (Infection Suspected) -Urine, Catheterized; Indwelling urinary catheter     Status: Abnormal   Collection Time: 01/09/24  3:47 PM  Result Value Ref Range   Specimen Source URINE, CATHETERIZED    Color, Urine AMBER (A) YELLOW   APPearance CLOUDY (A) CLEAR   Specific Gravity, Urine 1.013 1.005 - 1.030   pH 8.0 5.0 - 8.0   Glucose, UA NEGATIVE NEGATIVE mg/dL   Hgb urine dipstick NEGATIVE NEGATIVE   Bilirubin Urine NEGATIVE NEGATIVE   Ketones, ur NEGATIVE NEGATIVE mg/dL   Protein, ur >=699 (A) NEGATIVE mg/dL   Nitrite NEGATIVE NEGATIVE   Leukocytes,Ua LARGE (A) NEGATIVE   RBC / HPF 21-50 0 - 5 RBC/hpf   WBC, UA 21-50 0 - 5 WBC/hpf   Bacteria, UA FEW (A) NONE SEEN   Squamous Epithelial / HPF 0-5 0 - 5 /HPF   Hyaline Casts, UA PRESENT    Triple Phosphate Crystal PRESENT   I-Stat Lactic Acid, ED     Status: None   Collection Time: 01/09/24  3:49 PM  Result Value Ref Range   Lactic Acid, Venous 0.9 0.5 - 1.9 mmol/L  POC CBG, ED     Status: None   Collection Time: 01/09/24  3:50 PM  Result Value Ref Range   Glucose-Capillary 83 70 - 99 mg/dL  Comprehensive metabolic panel     Status: Abnormal   Collection Time: 01/09/24  3:53  PM  Result Value Ref Range   Sodium 135 135 - 145 mmol/L   Potassium 5.4 (H) 3.5 - 5.1 mmol/L   Chloride 111 98 - 111 mmol/L   CO2 19 (L) 22 - 32 mmol/L   Glucose, Bld 94 70 - 99 mg/dL   BUN 61 (H) 8 - 23  mg/dL   Creatinine, Ser 8.26 (H) 0.44 - 1.00 mg/dL   Calcium  9.0 8.9 - 10.3 mg/dL   Total Protein 7.1 6.5 - 8.1 g/dL   Albumin  3.4 (L) 3.5 - 5.0 g/dL   AST 30 15 - 41 U/L   ALT 16 0 - 44 U/L   Alkaline Phosphatase 78 38 - 126 U/L   Total Bilirubin 0.8 0.0 - 1.2 mg/dL   GFR, Estimated 33 (L) >60 mL/min   Anion gap 5 5 - 15  CBC with Differential     Status: Abnormal   Collection Time: 01/09/24  3:53 PM  Result Value Ref Range   WBC 9.9 4.0 - 10.5 K/uL   RBC 3.18 (L) 3.87 - 5.11 MIL/uL   Hemoglobin 10.5 (L) 12.0 - 15.0 g/dL   HCT 65.7 (L) 63.9 - 53.9 %   MCV 107.5 (H) 80.0 - 100.0 fL   MCH 33.0 26.0 - 34.0 pg   MCHC 30.7 30.0 - 36.0 g/dL   RDW 85.2 88.4 - 84.4 %   Platelets 193 150 - 400 K/uL   nRBC 0.0 0.0 - 0.2 %   Neutrophils Relative % 74 %   Neutro Abs 7.3 1.7 - 7.7 K/uL   Lymphocytes Relative 15 %   Lymphs Abs 1.5 0.7 - 4.0 K/uL   Monocytes Relative 9 %   Monocytes Absolute 0.8 0.1 - 1.0 K/uL   Eosinophils Relative 2 %   Eosinophils Absolute 0.2 0.0 - 0.5 K/uL   Basophils Relative 0 %   Basophils Absolute 0.0 0.0 - 0.1 K/uL   Immature Granulocytes 0 %   Abs Immature Granulocytes 0.03 0.00 - 0.07 K/uL  Protime-INR     Status: None   Collection Time: 01/09/24  3:53 PM  Result Value Ref Range   Prothrombin Time 13.6 11.4 - 15.2 seconds   INR 1.0 0.8 - 1.2  Urinalysis, Routine w reflex microscopic -Urine, Catheterized; Indwelling urinary catheter     Status: Abnormal   Collection Time: 01/09/24  4:28 PM  Result Value Ref Range   Color, Urine YELLOW YELLOW   APPearance HAZY (A) CLEAR   Specific Gravity, Urine 1.009 1.005 - 1.030   pH 7.0 5.0 - 8.0   Glucose, UA NEGATIVE NEGATIVE mg/dL   Hgb urine dipstick SMALL (A) NEGATIVE   Bilirubin Urine NEGATIVE NEGATIVE    Ketones, ur NEGATIVE NEGATIVE mg/dL   Protein, ur 899 (A) NEGATIVE mg/dL   Nitrite NEGATIVE NEGATIVE   Leukocytes,Ua LARGE (A) NEGATIVE   RBC / HPF 0-5 0 - 5 RBC/hpf   WBC, UA 21-50 0 - 5 WBC/hpf   Bacteria, UA RARE (A) NONE SEEN   Squamous Epithelial / HPF 0-5 0 - 5 /HPF  Resp panel by RT-PCR (RSV, Flu A&B, Covid) Anterior Nasal Swab     Status: None   Collection Time: 01/09/24  4:45 PM   Specimen: Anterior Nasal Swab  Result Value Ref Range   SARS Coronavirus 2 by RT PCR NEGATIVE NEGATIVE   Influenza A by PCR NEGATIVE NEGATIVE   Influenza B by PCR NEGATIVE NEGATIVE   Resp Syncytial Virus by PCR NEGATIVE NEGATIVE  I-Stat Lactic Acid, ED     Status: None   Collection Time: 01/09/24  5:55 PM  Result Value Ref Range   Lactic Acid, Venous 1.5 0.5 - 1.9 mmol/L  Creatinine, serum     Status: Abnormal   Collection Time: 01/09/24  6:00 PM  Result Value Ref Range   Creatinine, Ser 1.74 (  H) 0.44 - 1.00 mg/dL   GFR, Estimated 32 (L) >60 mL/min  Troponin I (High Sensitivity)     Status: None   Collection Time: 01/09/24  6:00 PM  Result Value Ref Range   Troponin I (High Sensitivity) 6 <18 ng/L   Basic Metabolic Panel: Recent Labs  Lab 01/09/24 1553 01/09/24 1800  NA 135  --   K 5.4*  --   CL 111  --   CO2 19*  --   GLUCOSE 94  --   BUN 61*  --   CREATININE 1.73* 1.74*  CALCIUM  9.0  --    Liver Function Tests: Recent Labs  Lab 01/09/24 1553  AST 30  ALT 16  ALKPHOS 78  BILITOT 0.8  PROT 7.1  ALBUMIN  3.4*   No results for input(s): LIPASE, AMYLASE in the last 168 hours. No results for input(s): AMMONIA in the last 168 hours. CBC: Recent Labs  Lab 01/09/24 1553  WBC 9.9  NEUTROABS 7.3  HGB 10.5*  HCT 34.2*  MCV 107.5*  PLT 193   Cardiac Enzymes: Recent Labs  Lab 01/09/24 1800  TROPONINIHS 6    BNP (last 3 results) No results for input(s): PROBNP in the last 8760 hours. CBG: Recent Labs  Lab 01/09/24 1550  GLUCAP 83    Radiological Exams  on Admission:  DG Chest Port 1 View Result Date: 01/09/2024 CLINICAL DATA:  Suspected sepsis. EXAM: PORTABLE CHEST 1 VIEW COMPARISON:  Chest radiograph dated 12/17/2021. FINDINGS: No focal consolidation, pleural effusion, pneumothorax. Mild cardiomegaly. Atherosclerotic calcification of the aortic arch. No acute osseous pathology. Degenerative changes of the spine and left shoulder. IMPRESSION: 1. No active disease. 2. Mild cardiomegaly. Electronically Signed   By: Vanetta Chou M.D.   On: 01/09/2024 17:15    EKG: Independently reviewed. No peaked T waves   Assessment and Plan: * Sepsis (HCC) To urinary tract infection and Foley blockage.  S/p Foley replacement.  Blood and urine cultures are pending.  Lactic acid within normal limit.  S/p 2 L fluid in the ER.  At this time I think sepsis has resolved.  Continue with ceftriaxone  treatment.  Pressure ulcer These are chronic, and inspected by author.  1 is in the upper mid back area.  1 is presacral.  These seem to be subcu deep.  Although there is some escahr  at the bottom which makes staging difficult.  These are less than 2 cm across.  Do not look infected.  Turn in position, wound care evaluation  Chronic indwelling Foley catheter Replaced by ER provder. C.w. foley care.  Type 1 diabetes mellitus with hyperlipidemia (HCC) Continue with insulin  pump, monitor glucose level.  Gastroesophageal reflux disease with esophagitis and hemorrhage This is chronically documented, continue with pantoprazole  twice daily.  Chronic kidney disease (CKD) stage G3a/A1, moderately decreased glomerular filtration rate (GFR) between 45-59 mL/min/1.73 square meter and albuminuria creatinine ratio less than 30 mg/g [facility]) Patient reports having had renal transplant.  Continue with cyclosporine  and azathioprine .  Check cyclosporine  level in the morning.  Review med rec after pharmacy input.  Patient's creatinine at baseline.  Patient has chronic  hyperkalemia, started on Lokelma .  Further it is noted that the patient's bicarb is below 20.  I will order sodium bicarbonate  therapy.  Addendum - patient doe snot have the controller for her insulin  pump. It is still infusing the basal insulin  for patient. I will order additional sliding scale and with meal coverage insulin  that we will administer patient.  Discussed with Dylan RN   Med rec done. C.w. prednisone  5 mg daily - indication not apparent. Hold anti HTN agets.   Advance Care Planning:   Code Status: Do not attempt resuscitation (DNR) PRE-ARREST INTERVENTIONS DESIRED discuseed with patinet and husband.   Consults: none  Family Communication: husabdn at bedsdie. All questions answered.  Severity of Illness: The appropriate patient status for this patient is INPATIENT. Inpatient status is judged to be reasonable and necessary in order to provide the required intensity of service to ensure the patient's safety. The patient's presenting symptoms, physical exam findings, and initial radiographic and laboratory data in the context of their chronic comorbidities is felt to place them at high risk for further clinical deterioration. Furthermore, it is not anticipated that the patient will be medically stable for discharge from the hospital within 2 midnights of admission.   * I certify that at the point of admission it is my clinical judgment that the patient will require inpatient hospital care spanning beyond 2 midnights from the point of admission due to high intensity of service, high risk for further deterioration and high frequency of surveillance required.*  Author: Jacqulyn Divine, MD 01/09/2024 7:06 PM  For on call review [email or web address].

## 2024-01-09 NOTE — Assessment & Plan Note (Signed)
 Patient reports having had renal transplant.  Continue with cyclosporine  and azathioprine .  Check cyclosporine  level in the morning.  Review med rec after pharmacy input.  Patient's creatinine at baseline.  Patient has chronic hyperkalemia, started on Lokelma .  Further it is noted that the patient's bicarb is below 20.  I will order sodium bicarbonate  therapy.

## 2024-01-09 NOTE — Assessment & Plan Note (Signed)
 To urinary tract infection and Foley blockage.  S/p Foley replacement.  Blood and urine cultures are pending.  Lactic acid within normal limit.  S/p 2 L fluid in the ER.  At this time I think sepsis has resolved.  Continue with ceftriaxone treatment.

## 2024-01-09 NOTE — Assessment & Plan Note (Signed)
 Patient reports having had renal transplant.  Continue with cyclosporine and azathioprine.  Check cyclosporine level in the morning.  Review med rec after pharmacy input.

## 2024-01-09 NOTE — ED Notes (Signed)
 Informed Dr.Goel about pt not having controller for her insulin pump, was informed to continue insulin pump and perform sliding scale insulin with POC CBG's.

## 2024-01-09 NOTE — Assessment & Plan Note (Signed)
 Replaced by ER provder. C.w. foley care.

## 2024-01-09 NOTE — ED Provider Notes (Signed)
 [place] EMERGENCY DEPARTMENT AT [facility] Provider Note   CSN: [id number] Arrival date & time: 01/09/24  1529     History  Chief Complaint  Patient presents with   Urinary Tract Infection    [name] is a 63 y.o. female.  This is a 63 year old female with chronic indwelling Foley presenting emergency department for Foley catheter not working as well as altered mental status.  Reportedly more confused than normal.  Supposedly symptoms started today.  Patient has no specific complaints other than leaking from around Foley.  She is alert and oriented x 3.  Denies chest pain abdominal pain or nausea vomiting   Urinary Tract Infection      Home Medications Prior to Admission medications   Medication Sig Start Date End Date Taking? Authorizing Provider  azaTHIOprine  (IMURAN ) 50 MG tablet Take 1 tablet (50 mg total) by mouth daily. 03/02/21  Yes Will Almarie MATSU, MD  cycloSPORINE  modified (NEORAL ) 25 MG capsule Take 75 mg by mouth in the morning and at bedtime. 12/13/21  Yes [provider]  levothyroxine  (SYNTHROID ) 100 MCG tablet Take 100 mcg by mouth daily before breakfast.   Yes [provider]  lisinopril  (ZESTRIL ) 5 MG tablet Take 5 mg by mouth at bedtime. 12/19/19  Yes [provider]  NOVOLOG  100 UNIT/ML injection Inject 100 Units into the skin See admin instructions. Per Omnipod insulin  pump   Yes [provider]  predniSONE  (DELTASONE ) 5 MG tablet Take 1 tablet (5 mg total) by mouth daily with breakfast. 03/02/21  Yes Will Almarie MATSU, MD  sucralfate  (CARAFATE ) 1 GM/10ML suspension Take 10 mLs (1 g total) by mouth 4 (four) times daily -  with meals and at bedtime. 12/22/21 01/09/24 Yes Sebastian Toribio GAILS, MD      Allergies    Patient has no known allergies.    Review of Systems   Review of Systems  Physical Exam Updated Vital Signs BP 136/65 (BP Location: Right Arm)   Pulse 88   Temp 98.8 F (37.1 C)    Resp 16   Ht 5' 3 (1.6 m)   Wt 59 kg   SpO2 100%   BMI 23.04 kg/m  Physical Exam Vitals and nursing note reviewed.  Constitutional:      General: She is not in acute distress. HENT:     Head: Normocephalic.     Mouth/Throat:     Mouth: Mucous membranes are moist.  Eyes:     Conjunctiva/sclera: Conjunctivae normal.  Cardiovascular:     Rate and Rhythm: Normal rate and regular rhythm.     Pulses: Normal pulses.  Pulmonary:     Effort: Pulmonary effort is normal.     Breath sounds: Normal breath sounds.  Abdominal:     General: Abdomen is flat. There is no distension.     Tenderness: There is abdominal tenderness. There is no guarding or rebound.     Comments: Mild suprapubic  Genitourinary:    Comments: Foley in place, does not appear to have much in bag.  Rolled and noted to have a quarter sized deep sacral decubitus ulcer with some surrounding pressure injury.  Does not appear to be overtly infected or have purulent drainage Skin:    General: Skin is warm and dry.     Capillary Refill: Capillary refill takes less than 2 seconds.  Neurological:     Mental Status: She is alert and oriented to person, place, and time.  Psychiatric:  Mood and Affect: Mood normal.        Behavior: Behavior normal.     ED Results / Procedures / Treatments   Labs (all labs ordered are listed, but only abnormal results are displayed) Labs Reviewed  COMPREHENSIVE METABOLIC PANEL - Abnormal; Notable for the following components:      Result Value   Potassium 5.4 (*)    CO2 19 (*)    BUN 61 (*)    Creatinine, Ser 1.73 (*)    Albumin  3.4 (*)    GFR, Estimated 33 (*)    All other components within normal limits  CBC WITH DIFFERENTIAL/PLATELET - Abnormal; Notable for the following components:   RBC 3.18 (*)    Hemoglobin 10.5 (*)    HCT 34.2 (*)    MCV 107.5 (*)    All other components within normal limits  URINALYSIS, W/ REFLEX TO CULTURE (INFECTION SUSPECTED) - Abnormal; Notable  for the following components:   Color, Urine AMBER (*)    APPearance CLOUDY (*)    Protein, ur >=300 (*)    Leukocytes,Ua LARGE (*)    Bacteria, UA FEW (*)    All other components within normal limits  URINALYSIS, ROUTINE W REFLEX MICROSCOPIC - Abnormal; Notable for the following components:   APPearance HAZY (*)    Hgb urine dipstick SMALL (*)    Protein, ur 100 (*)    Leukocytes,Ua LARGE (*)    Bacteria, UA RARE (*)    All other components within normal limits  CREATININE, SERUM - Abnormal; Notable for the following components:   Creatinine, Ser 1.74 (*)    GFR, Estimated 32 (*)    All other components within normal limits  CBG MONITORING, ED - Abnormal; Notable for the following components:   Glucose-Capillary 63 (*)    All other components within normal limits  RESP PANEL BY RT-PCR (RSV, FLU A&B, COVID)  RVPGX2  CULTURE, BLOOD (ROUTINE X 2)  CULTURE, BLOOD (ROUTINE X 2)  URINE CULTURE  PROTIME-INR  HIV ANTIBODY (ROUTINE TESTING W REFLEX)  APTT  PROTIME-INR  BASIC METABOLIC PANEL  CBC  CBC  CYCLOSPORINE   HEMOGLOBIN A1C  I-STAT CG4 LACTIC ACID, ED  CBG MONITORING, ED  I-STAT CG4 LACTIC ACID, ED  CBG MONITORING, ED  TROPONIN I (HIGH SENSITIVITY)    EKG EKG Interpretation Date/Time:  Monday January 09 2024 15:53:54 EST Ventricular Rate:  124 PR Interval:  154 QRS Duration:  88 QT Interval:  291 QTC Calculation: 418 R Axis:   162  Text Interpretation: Sinus tachycardia Anterolateral infarct, age indeterminate Confirmed by Neysa Clap ([id number]) on 01/09/2024 5:18:15 PM  Radiology DG Chest Port 1 View Result Date: 01/09/2024 CLINICAL DATA:  Suspected sepsis. EXAM: PORTABLE CHEST 1 VIEW COMPARISON:  Chest radiograph dated 12/17/2021. FINDINGS: No focal consolidation, pleural effusion, pneumothorax. Mild cardiomegaly. Atherosclerotic calcification of the aortic arch. No acute osseous pathology. Degenerative changes of the spine and left shoulder. IMPRESSION: 1. No  active disease. 2. Mild cardiomegaly. Electronically Signed   By: Vanetta Chou M.D.   On: 01/09/2024 17:15    Procedures Procedures    Medications Ordered in ED Medications  acetaminophen  (TYLENOL ) tablet 650 mg (has no administration in time range)    Or  acetaminophen  (TYLENOL ) suppository 650 mg (has no administration in time range)  polyethylene glycol (MIRALAX  / GLYCOLAX ) packet 17 g (has no administration in time range)  sodium chloride  flush (NS) 0.9 % injection 3 mL (has no administration in time range)  enoxaparin  (  LOVENOX ) injection 30 mg (has no administration in time range)  insulin  pump ( Subcutaneous Not Given 01/09/24 2109)  cefTRIAXone  (ROCEPHIN ) 1 g in sodium chloride  0.9 % 100 mL IVPB (has no administration in time range)  azaTHIOprine  (IMURAN ) tablet 50 mg (has no administration in time range)  cycloSPORINE  modified (NEORAL ) capsule 75 mg (has no administration in time range)  pantoprazole  (PROTONIX ) EC tablet 40 mg (has no administration in time range)  sodium zirconium cyclosilicate  (LOKELMA ) packet 5 g (5 g Oral Given 01/09/24 2051)  sodium bicarbonate  tablet 650 mg (has no administration in time range)  insulin  aspart (novoLOG ) injection 0-5 Units ( Subcutaneous Not Given 01/09/24 2106)  insulin  aspart (novoLOG ) injection 0-9 Units (has no administration in time range)  insulin  aspart (novoLOG ) injection 2 Units (has no administration in time range)  sodium chloride  0.9 % bolus 1,000 mL (0 mLs Intravenous Stopped 01/09/24 1935)  cefTRIAXone  (ROCEPHIN ) 1 g in sodium chloride  0.9 % 100 mL IVPB (0 g Intravenous Stopped 01/09/24 1639)  sodium chloride  0.9 % bolus 1,000 mL (0 mLs Intravenous Stopped 01/09/24 1935)  acetaminophen  (TYLENOL ) tablet 650 mg (650 mg Oral Given 01/09/24 1738)    ED Course/ Medical Decision Making/ A&P Clinical Course as of 01/09/24 2210  Mon Jan 09, 2024  1540 Per chart review has history of diabetes, osteomyelitis of sacrum and prior  osteomyelitis of right foot. [TY]  1540 Echo 12/08/20 per chart review; IMPRESSIONS     1. Left ventricular ejection fraction, by estimation, is 65 to 70%. The  left ventricle has normal function. The left ventricle has no regional  wall motion abnormalities. There is moderate left ventricular hypertrophy.  Left ventricular diastolic  parameters are consistent with Grade I diastolic dysfunction (impaired  relaxation).   2. Right ventricular systolic function is normal. The right ventricular  size is normal. Mildly increased right ventricular wall thickness.  Tricuspid regurgitation signal is inadequate for assessing PA pressure.   3. The mitral valve is normal in structure. Trivial mitral valve  regurgitation. No evidence of mitral stenosis.   4. The aortic valve was not well visualized. Aortic valve regurgitation  is trivial. No aortic stenosis is present.   5. The inferior vena cava is normal in size with greater than 50%  respiratory variability, suggesting right atrial pressure of 3 mmHg.   6. A small pericardial effusion is present.   [TY]  1542 H/o of E. Coli and prutius UTIs sensative to rocephin .  Given patient with fever and tachycardia with chronic indwelling Foley will treat empirically with antibiotics at this time. [TY]  1725 Workup is shows no leukocytosis no elevated lactate to suggest severe sepsis.  However patient still tachycardic after 2 L of fluid.  Given age and comorbid medical issues with abnormal vital signs that she should be admitted for further IV antibiotics. [TY]    Clinical Course User Index [TY] Neysa Caron PARAS, DO                                 Medical Decision Making This is a 63 year old female presenting emergency department with complaint of Foley catheter issues and altered mental status/delirium.  She arrived with vital signs concerning for sepsis with fever and tachycardia, however no tachypnea hemodynamically stable.  On exam does have sacral  decubitus and indwelling Foley.  Started on empiric antibiotics for presumed UTI.  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  Workup as noted in ED course largely reassuring will admit for SIRS/sepsis presumed to be secondary to UTI or possibly sacral decubitus.  Amount and/or Complexity of Data Reviewed Independent Historian:     Details: EMS reported stable vitals, but febrile Labs: ordered. Radiology: ordered. ECG/medicine tests: ordered. Discussion of management or test interpretation with external provider(s): Case discussed with hospitalist who agrees to admit patient.  Risk OTC drugs. Decision regarding hospitalization.          Final Clinical Impression(s) / ED Diagnoses Final diagnoses:  Sepsis, due to unspecified organism, unspecified whether acute organ dysfunction present [facility])    Rx / DC Orders ED Discharge Orders     None         Neysa Caron PARAS, DO 01/09/24 2210

## 2024-01-09 NOTE — Assessment & Plan Note (Addendum)
 These are chronic, and inspected by author.  1 is in the upper mid back area.  1 is presacral.  These seem to be subcu deep.  Although there is some escahr  at the bottom which makes staging difficult.  These are less than 2 cm across.  Do not look infected.  Turn in position, wound care evaluation

## 2024-01-09 NOTE — Assessment & Plan Note (Signed)
 This is chronically documented, continue with pantoprazole twice daily.

## 2024-01-09 NOTE — ED Notes (Signed)
 Provided pt with cola, [place] sandwich, sausage biscuit due to pt's POC CBG was 63, MD aware.

## 2024-01-09 NOTE — Assessment & Plan Note (Signed)
 Continue with insulin pump, monitor glucose level.

## 2024-01-09 NOTE — ED Triage Notes (Addendum)
 Pt BIB EMS from home, pt reports pain to her foley insertion site. Pt has cloudy urine, and the urine is not flowing into the bag. Pt has been altered today, hx of UTI's. Husband states that she gets altered with her UTI's. Pt is a right BKA.

## 2024-01-09 NOTE — ED Notes (Signed)
 Awaiting due meds to be sent from pharmacy.

## 2024-01-10 DIAGNOSIS — N390 Urinary tract infection, site not specified: Secondary | ICD-10-CM

## 2024-01-10 DIAGNOSIS — Z978 Presence of other specified devices: Secondary | ICD-10-CM | POA: Diagnosis not present

## 2024-01-10 DIAGNOSIS — A419 Sepsis, unspecified organism: Secondary | ICD-10-CM | POA: Diagnosis not present

## 2024-01-10 LAB — GLUCOSE, CAPILLARY
Glucose-Capillary: 83 mg/dL (ref 70–99)
Glucose-Capillary: 79 mg/dL (ref 70–99)
Glucose-Capillary: 140 mg/dL — ABNORMAL HIGH (ref 70–99)

## 2024-01-10 LAB — HEMOGLOBIN A1C
Hgb A1c MFr Bld: 7.2 % — ABNORMAL HIGH (ref 4.8–5.6)
Mean Plasma Glucose: 159.94 mg/dL

## 2024-01-10 LAB — BASIC METABOLIC PANEL
Anion gap: 5 (ref 5–15)
Anion gap: 6 (ref 5–15)
BUN: 52 mg/dL — ABNORMAL HIGH (ref 8–23)
BUN: 53 mg/dL — ABNORMAL HIGH (ref 8–23)
CO2: 19 mmol/L — ABNORMAL LOW (ref 22–32)
CO2: 20 mmol/L — ABNORMAL LOW (ref 22–32)
Calcium: 8.6 mg/dL — ABNORMAL LOW (ref 8.9–10.3)
Calcium: 8.7 mg/dL — ABNORMAL LOW (ref 8.9–10.3)
Chloride: 114 mmol/L — ABNORMAL HIGH (ref 98–111)
Chloride: 111 mmol/L (ref 98–111)
Creatinine, Ser: 1.8 mg/dL — ABNORMAL HIGH (ref 0.44–1.00)
Creatinine, Ser: 1.76 mg/dL — ABNORMAL HIGH (ref 0.44–1.00)
GFR, Estimated: 31 mL/min — ABNORMAL LOW (ref >60)
GFR, Estimated: 32 mL/min — ABNORMAL LOW (ref >60)
Glucose, Bld: 166 mg/dL — ABNORMAL HIGH (ref 70–99)
Glucose, Bld: 72 mg/dL (ref 70–99)
Potassium: 4.7 mmol/L (ref 3.5–5.1)
Potassium: 5 mmol/L (ref 3.5–5.1)
Sodium: 138 mmol/L (ref 135–145)
Sodium: 137 mmol/L (ref 135–145)

## 2024-01-10 LAB — CBC
HCT: 27.6 % — ABNORMAL LOW (ref 36.0–46.0)
Hemoglobin: 8.5 g/dL — ABNORMAL LOW (ref 12.0–15.0)
MCH: 33.2 pg (ref 26.0–34.0)
MCHC: 30.8 g/dL (ref 30.0–36.0)
MCV: 107.8 fL — ABNORMAL HIGH (ref 80.0–100.0)
Platelets: 155 10*3/uL (ref 150–400)
RBC: 2.56 MIL/uL — ABNORMAL LOW (ref 3.87–5.11)
RDW: 14.8 % (ref 11.5–15.5)
WBC: 9.7 10*3/uL (ref 4.0–10.5)
nRBC: 0 % (ref 0.0–0.2)

## 2024-01-10 LAB — PROTIME-INR
INR: 1.2 (ref 0.8–1.2)
Prothrombin Time: 15.7 s — ABNORMAL HIGH (ref 11.4–15.2)

## 2024-01-10 LAB — CBG MONITORING, ED: Glucose-Capillary: 240 mg/dL — ABNORMAL HIGH (ref 70–99)

## 2024-01-10 LAB — MRSA NEXT GEN BY PCR, NASAL: MRSA by PCR Next Gen: NOT DETECTED

## 2024-01-10 LAB — APTT: aPTT: 38 s — ABNORMAL HIGH (ref 24–36)

## 2024-01-10 MED ORDER — SULFAMETHOXAZOLE-TRIMETHOPRIM 800-160 MG PO TABS: 1.0000 | ORAL_TABLET | Freq: Two times a day (BID) | ORAL | 0 refills | Status: DC

## 2024-01-10 MED ORDER — INSULIN ASPART 100 UNIT/ML IJ SOLN
2.0000 [IU] | Freq: Once | INTRAMUSCULAR | Status: AC
  Administered 2024-01-10: 2 [IU] via SUBCUTANEOUS
  Filled 2024-01-10: qty 0.02

## 2024-01-10 MED ORDER — CHLORHEXIDINE GLUCONATE CLOTH 2 % EX PADS
6.0000 | MEDICATED_PAD | Freq: Every day | CUTANEOUS | Status: DC
  Administered 2024-01-10: 6 via TOPICAL

## 2024-01-10 NOTE — ED Notes (Signed)
 Pt currently resting in bed, pt reports improvement with pain.

## 2024-01-10 NOTE — ED Notes (Signed)
 Pt. CBG 240, Dylan made aware.

## 2024-01-10 NOTE — TOC CM/SW Note (Signed)
 Transition of Care [facility]) - Inpatient Brief Assessment   Patient Details  Name: [name] MRN: [id number] Date of Birth: 04/28/1961  Transition of [facility] CM/SW Contact:    Dalila Camellia SAUNDERS, LCSW Phone Number: 01/10/2024, 5:00 PM   Clinical Narrative:  Patient has insurance and a PCP.  Patient does not have any SDOH needs, no anticipated TOC needs at this time.   Transition of Care Asessment: Insurance and Status: Insurance coverage has been reviewed Patient has primary care physician: Yes Home environment has been reviewed: Yes lives with spouse Prior level of function:: Able to get around even though she is an old BKA Prior/Current Home Services: No current home services Social Drivers of Health Review: SDOH reviewed no interventions necessary   Transition of care needs: no transition of care needs at this time

## 2024-01-10 NOTE — Consult Note (Signed)
 WOC Nurse Consult Note: Reason for Consult: Consult requested for sacrum and back wounds.  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 Pt is very thin and site is located over her bone and it will be difficult to promote healing.  Pressure Injury POA: Yes Dressing procedure/placement/frequency: Topical treatment orders provided for bedside nurses to absorb drainage and promote healing as follows: 1. Cut piece of Aquacel and tuck into sacrum wound Q day, then cover with foam dressing.  Change foam dressing Q 3 days or PRN soiling. 2. Foam dressing to middle back, change Q 3 days or PRN soiling. Please re-consult if further assistance is needed.  Thank-you,  Stephane Fought MSN, RN, CWOCN, [place], CNS [phone]

## 2024-01-10 NOTE — Discharge Summary (Addendum)
 Physician Discharge Summary   Patient: [name] MRN: [id number] DOB: [birth year]  Admit date:     01/09/2024  Discharge date: 01/10/24  Discharge Physician: Reyes VEAR Gaw   PCP: Gladystine Erminio CROME, MD   Recommendations at discharge:   Ensure patient has completed course of antibiotics. Follow-up with urology for regularly scheduled catheter changes. Follow-up with PCP and endocrinology for ongoing management of type 1 diabetes.  Discharge Diagnoses: Principal Problem:   Sepsis (HCC) Active Problems:   Chronic kidney disease (CKD) stage G3a/A1, moderately decreased glomerular filtration rate (GFR) between 45-59 mL/min/1.73 square meter and albuminuria creatinine ratio less than 30 mg/g (HCC)   Gastroesophageal reflux disease with esophagitis and hemorrhage   Type 1 diabetes mellitus with hyperlipidemia (HCC)   Chronic indwelling Foley catheter   Pressure ulcer  Resolved Problems:   * No resolved hospital problems. *  Hospital Course: [name] is a 63 y.o. female with medical history significant of reported neuropathy, diabetes mellitus type 1, insulin  pump, right below-knee amputation, generalized deconditioning, pressure ulceration of sacrum and upper back, chronic Foley.  Patient reports being in her usual state of health till last evening when patient's Foley reportedly got blocked.  She denies any actual abdominal pain or suprapubic discomfort.  Reports that she was acting more irritated/belligerent to husband and that these are often the symptoms if she has a urinary tract infection.  In the ER she had fever to 102 and heart rate of the 100s.  Foley was replaced and she was started on ceftriaxone  and had 2 L of fluid were placed.  Reported feeling back to her baseline in the emergency department but patient was advised to be observed overnight and therefore she was admitted to the hospital.    On hospital day 1 patient continued to exhibit improvement.  She reported  multiple times she was at her baseline and continually asked to go home.  Denied any abdominal pain.  Reported feeling much better after having her catheter replaced.  No fevers after admission.  Eating and drinking well.  No nausea or vomiting.  Due to degree of improvement patient able to be discharged with home Bactrim , see below.  Assessment and Plan: * Sepsis (HCC)/complicated UTI: - Due to urinary tract infection and Foley blockage.  S/p Foley replacement.  Blood and urine cultures are pending.  Lactic acid within normal limit.  S/p 2 L fluid in the ER.   -Sepsis resolved in the emergency room.  Patient herself wanted to go home after her Foley catheter has been replaced but was deemed she could benefit from overnight observation and therefore she was admitted. -Completely resolved next day as well.  Antibiotics changed to versus generation cephalosporin cefadroxil. -Upon review of records most recent urine culture was multiple species.  Urine culture before that in August was Proteus and E. coli both of which were mostly pansensitive, Proteus was resistant to Macrobid. -Based on this and the fact she is at increased risk of gram-positive organisms with chronic indwelling Foley plan is to discharge her home on Bactrim  double strength for 7 days and diagnosing her as complicated UTI in light of her indwelling Foley catheter.  Pressure ulcer These are chronic. 1 is in the upper mid back area.  1 is presacral.  These seem to be subcu deep.  Although there is some escahr  at the bottom which makes staging difficult.  These are less than 2 cm across.  Do not look infected. -Wound care evaluated the  hospital.  Foam dressing.  Chronic indwelling Foley catheter Replaced by ER provder. C.w. foley care. -Urology for change.  She is previously scheduled for this.  Type 1 diabetes mellitus with hyperlipidemia (HCC) Continue with insulin  pump, monitor glucose level.  Gastroesophageal reflux disease  with esophagitis and hemorrhage This is chronically documented, continue with pantoprazole  twice daily.  Chronic kidney disease (CKD) stage G3a/A1, moderately decreased glomerular filtration rate (GFR) between 45-59 mL/min/1.73 square meter and albuminuria creatinine ratio less than 30 mg/g [facility]) Patient reports having had renal transplant.  Continue with cyclosporine  and azathioprine .  Check cyclosporine  level in the morning.  Review med rec after pharmacy input.  Patient's creatinine at baseline.  Patient has chronic hyperkalemia, started on Lokelma .         Procedures performed: None Disposition: Home Diet recommendation:  Discharge Diet Orders (From admission, onward)     Start     Ordered   01/10/24 0000  Diet Carb Modified        01/10/24 1605           Carb modified  DISCHARGE MEDICATION: Allergies as of 01/10/2024   No Known Allergies      Medication List     TAKE these medications    azaTHIOprine  50 MG tablet Commonly known as: IMURAN  Take 1 tablet (50 mg total) by mouth daily.   cycloSPORINE  modified 25 MG capsule Commonly known as: NEORAL  Take 75 mg by mouth in the morning and at bedtime.   levothyroxine  100 MCG tablet Commonly known as: SYNTHROID  Take 100 mcg by mouth daily before breakfast.   lisinopril  5 MG tablet Commonly known as: ZESTRIL  Take 5 mg by mouth at bedtime.   NovoLOG  100 UNIT/ML injection Generic drug: insulin  aspart Inject 100 Units into the skin See admin instructions. Per Omnipod insulin  pump   predniSONE  5 MG tablet Commonly known as: DELTASONE  Take 1 tablet (5 mg total) by mouth daily with breakfast.   sucralfate  1 GM/10ML suspension Commonly known as: CARAFATE  Take 10 mLs (1 g total) by mouth 4 (four) times daily -  with meals and at bedtime.   sulfamethoxazole -trimethoprim  800-160 MG tablet Commonly known as: Bactrim  DS Take 1 tablet by mouth 2 (two) times daily for 3 days.               Discharge Care  Instructions  (From admission, onward)           Start     Ordered   01/10/24 0000  Discharge wound care:       Comments: Continue home wound care as you have been   01/10/24 1605            Discharge Exam: Filed Weights   01/09/24 1535  Weight: 59 kg   General: Patient is alert and awake, pterygium/corneal scarring left side and started pterygium on right.  Completely coherent and fully oriented Respiratory exam: Bilateral intravesicular Cardiovascular exam S1-S2 normal Abdomen, nondistended, nontender. Foley in situ.  For not draining. Right BKA site dressed. Patient is deconditioned demonstrating poor antigravity strength of lower extremities.  Able to sit up in bed easily on my exam today.  No other neurological deficits noted other than chronic contractures of hands  Condition at discharge: Good  The results of significant diagnostics from this hospitalization (including imaging, microbiology, ancillary and laboratory) are listed below for reference.   Imaging Studies: DG Chest Port 1 View Result Date: 01/09/2024 CLINICAL DATA:  Suspected sepsis. EXAM: PORTABLE CHEST 1 VIEW COMPARISON:  Chest radiograph dated 12/17/2021. FINDINGS: No focal consolidation, pleural effusion, pneumothorax. Mild cardiomegaly. Atherosclerotic calcification of the aortic arch. No acute osseous pathology. Degenerative changes of the spine and left shoulder. IMPRESSION: 1. No active disease. 2. Mild cardiomegaly. Electronically Signed   By: Vanetta Chou M.D.   On: 01/09/2024 17:15    Microbiology: Results for orders placed or performed during the hospital encounter of 01/09/24  Culture, blood (Routine x 2)     Status: None (Preliminary result)   Collection Time: 01/09/24  3:45 PM   Specimen: BLOOD  Result Value Ref Range Status   Specimen Description   Final    BLOOD SITE NOT SPECIFIED Performed at [facility], [address] [place], [place] [address]    Special  Requests   Final    BOTTLES DRAWN AEROBIC AND ANAEROBIC Blood Culture results may not be optimal due to an inadequate volume of blood received in culture bottles Performed at [facility], [address] [place], [place] [address]    Culture   Final    NO GROWTH < 12 HOURS Performed at [facility], [address] [place], [place] [address]    Report Status PENDING  Incomplete  Urine Culture     Status: Abnormal (Preliminary result)   Collection Time: 01/09/24  3:47 PM   Specimen: Urine, Random  Result Value Ref Range Status   Specimen Description   Final    URINE, RANDOM Performed at [facility], [address], [place], [place] [address]    Special Requests   Final    NONE Reflexed from [id number] Performed at [facility], [address] [place], [place] [address]    Culture (A)  Final    >=100,000 COLONIES/mL PROTEUS MIRABILIS SUSCEPTIBILITIES TO FOLLOW Performed at [facility], [address] [place], [place] [address]    Report Status PENDING  Incomplete  Resp panel by RT-PCR (RSV, Flu A&B, Covid) Anterior Nasal Swab     Status: None   Collection Time: 01/09/24  4:45 PM   Specimen: Anterior Nasal Swab  Result Value Ref Range Status   SARS Coronavirus 2 by RT PCR NEGATIVE NEGATIVE Final    Comment: (NOTE) SARS-CoV-2 target nucleic acids are NOT DETECTED.  The SARS-CoV-2 RNA is generally detectable in upper respiratory specimens during the acute phase of infection. The lowest concentration of SARS-CoV-2 viral copies this assay can detect is 138 copies/mL. A negative result does not preclude SARS-Cov-2 infection and should not be used as the sole basis for treatment or other patient management decisions. A negative result may occur with  improper specimen collection/handling, submission of specimen other than nasopharyngeal swab, presence of viral mutation(s) within the areas targeted by this assay,  and inadequate number of viral copies(<138 copies/mL). A negative result must be combined with clinical observations, patient history, and epidemiological information. The expected result is Negative.  Fact Sheet for Patients:  bloggercourse.com  Fact Sheet for Healthcare Providers:  seriousbroker.it  This test is no t yet approved or cleared by the [place]  FDA and  has been authorized for detection and/or diagnosis of SARS-CoV-2 by FDA under an Emergency Use Authorization (EUA). This EUA will remain  in effect (meaning this test can be used) for the duration of the COVID-19 declaration under Section 564(b)(1) of the Act, 21 U.S.C.section 360bbb-3(b)(1), unless the authorization is terminated  or revoked sooner.       Influenza A by  PCR NEGATIVE NEGATIVE Final   Influenza B by PCR NEGATIVE NEGATIVE Final    Comment: (NOTE) The Xpert Xpress SARS-CoV-2/FLU/RSV plus assay is intended as an aid in the diagnosis of influenza from Nasopharyngeal swab specimens and should not be used as a sole basis for treatment. Nasal washings and aspirates are unacceptable for Xpert Xpress SARS-CoV-2/FLU/RSV testing.  Fact Sheet for Patients: bloggercourse.com  Fact Sheet for Healthcare Providers: seriousbroker.it  This test is not yet approved or cleared by the [place]  FDA and has been authorized for detection and/or diagnosis of SARS-CoV-2 by FDA under an Emergency Use Authorization (EUA). This EUA will remain in effect (meaning this test can be used) for the duration of the COVID-19 declaration under Section 564(b)(1) of the Act, 21 U.S.C. section 360bbb-3(b)(1), unless the authorization is terminated or revoked.     Resp Syncytial Virus by PCR NEGATIVE NEGATIVE Final    Comment: (NOTE) Fact Sheet for Patients: bloggercourse.com  Fact Sheet for  Healthcare Providers: seriousbroker.it  This test is not yet approved or cleared by the [place]  FDA and has been authorized for detection and/or diagnosis of SARS-CoV-2 by FDA under an Emergency Use Authorization (EUA). This EUA will remain in effect (meaning this test can be used) for the duration of the COVID-19 declaration under Section 564(b)(1) of the Act, 21 U.S.C. section 360bbb-3(b)(1), unless the authorization is terminated or revoked.  Performed at [facility], [address] [place], [place] [address]   Culture, blood (Routine x 2)     Status: None (Preliminary result)   Collection Time: 01/09/24  4:56 PM   Specimen: BLOOD LEFT ARM  Result Value Ref Range Status   Specimen Description   Final    BLOOD LEFT ARM Performed at [facility], [address] [place], [place] [address]    Special Requests   Final    BOTTLES DRAWN AEROBIC AND ANAEROBIC Blood Culture results may not be optimal due to an inadequate volume of blood received in culture bottles Performed at [facility], [address] [place], [place] [address]    Culture   Final    NO GROWTH < 12 HOURS Performed at [facility], [address] [place], [place] [address]    Report Status PENDING  Incomplete  MRSA Next Gen by PCR, Nasal     Status: None   Collection Time: 01/10/24  1:07 PM   Specimen: Nasal Mucosa; Nasal Swab  Result Value Ref Range Status   MRSA by PCR Next Gen NOT DETECTED NOT DETECTED Final    Comment: (NOTE) The GeneXpert MRSA Assay (FDA approved for NASAL specimens only), is one component of a comprehensive MRSA colonization surveillance program. It is not intended to diagnose MRSA infection nor to guide or monitor treatment for MRSA infections. Test performance is not FDA approved in patients less than 71 years old. Performed at [facility], [address], [place], [place] [address]     Labs: CBC: Recent Labs  Lab 01/09/24 1553 01/10/24 0520  WBC 9.9 9.7  NEUTROABS 7.3  --   HGB 10.5* 8.5*  HCT 34.2* 27.6*  MCV 107.5* 107.8*  PLT 193 155   Basic Metabolic Panel: Recent Labs  Lab 01/09/24 1553 01/09/24 1800 01/10/24 0520 01/10/24 1409  NA 135  --  138 137  K 5.4*  --  4.7 5.0  CL 111  --  114* 111  CO2 19*  --  19*  20*  GLUCOSE 94  --  166* 72  BUN 61*  --  52* 53*  CREATININE 1.73* 1.74* 1.80* 1.76*  CALCIUM  9.0  --  8.6* 8.7*   Liver Function Tests: Recent Labs  Lab 01/09/24 1553  AST 30  ALT 16  ALKPHOS 78  BILITOT 0.8  PROT 7.1  ALBUMIN  3.4*   CBG: Recent Labs  Lab 01/09/24 2054 01/09/24 2205 01/10/24 0151 01/10/24 0945 01/10/24 1118  GLUCAP 63* 96 240* 83 79    Discharge time spent: less than 30 minutes.  Signed: Reyes VEAR Gaw, MD [facility] 01/10/2024

## 2024-01-10 NOTE — Inpatient Diabetes Management (Signed)
 Inpatient Diabetes Program Recommendations  AACE/ADA: New Consensus Statement on Inpatient Glycemic Control (2015)  Target Ranges:  Prepandial:   less than 140 mg/dL      Peak postprandial:   less than 180 mg/dL (1-2 hours)      Critically ill patients:  140 - 180 mg/dL   Lab Results  Component Value Date   GLUCAP 79 01/10/2024   HGBA1C 7.2 (H) 01/09/2024    Review of Glycemic Control  Diabetes history: DM1 Outpatient Diabetes medications: OmniPod insulin  pump with Dexcom G6,uses Humalog , if pump not on, Lantus  14 daily Current orders for Inpatient glycemic control: Insulin  pump OmniPod  Current Basal--total 7.05 units/24 ICR-1 units for every 15 carb Insulin  sensitivity factor-60 mg/dL (1 unit drops her 60 points Target BG-12am-8am--130                        8am-5pm-140                        5pm-MN-130  HgbA1C 7.2% Endo - Dr Beryl Last OV - 10/26/23  Inpatient Diabetes Program Recommendations:    If pump is removed, please order:  Semglee  10 units every day Novolog  0-6 TID and 0-5 HS Novolog  3 units TID with meals if eating > 50%  Will continue to follow while inpatient.  Thank you. [name], RD, LDN, CDCES Inpatient Diabetes Coordinator [phone]

## 2024-01-11 ENCOUNTER — Other Ambulatory Visit: Payer: Self-pay | Admitting: Internal Medicine

## 2024-01-11 ENCOUNTER — Other Ambulatory Visit: Payer: Self-pay

## 2024-01-11 ENCOUNTER — Inpatient Hospital Stay (HOSPITAL_COMMUNITY)
Admission: EM | Admit: 2024-01-11 | Discharge: 2024-01-17 | DRG: 871 | Disposition: A | Discharge status: Home / Self Care | Payer: BC Managed Care – PPO | Attending: Internal Medicine | Admitting: Internal Medicine

## 2024-01-11 DIAGNOSIS — N1832 Chronic kidney disease, stage 3b: Secondary | ICD-10-CM | POA: Diagnosis present

## 2024-01-11 DIAGNOSIS — Y846 Urinary catheterization as the cause of abnormal reaction of the patient, or of later complication, without mention of misadventure at the time of the procedure: Secondary | ICD-10-CM | POA: Diagnosis present

## 2024-01-11 DIAGNOSIS — Z940 Kidney transplant status: Secondary | ICD-10-CM

## 2024-01-11 DIAGNOSIS — E1022 Type 1 diabetes mellitus with diabetic chronic kidney disease: Secondary | ICD-10-CM | POA: Diagnosis present

## 2024-01-11 DIAGNOSIS — N390 Urinary tract infection, site not specified: Secondary | ICD-10-CM | POA: Diagnosis present

## 2024-01-11 DIAGNOSIS — R7881 Bacteremia: Principal | ICD-10-CM | POA: Diagnosis present

## 2024-01-11 DIAGNOSIS — L8990 Pressure ulcer of unspecified site, unspecified stage: Secondary | ICD-10-CM | POA: Diagnosis present

## 2024-01-11 DIAGNOSIS — D539 Nutritional anemia, unspecified: Secondary | ICD-10-CM | POA: Diagnosis present

## 2024-01-11 DIAGNOSIS — Z431 Encounter for attention to gastrostomy: Secondary | ICD-10-CM

## 2024-01-11 DIAGNOSIS — Z79624 Long term (current) use of inhibitors of nucleotide synthesis: Secondary | ICD-10-CM

## 2024-01-11 DIAGNOSIS — E860 Dehydration: Secondary | ICD-10-CM | POA: Diagnosis present

## 2024-01-11 DIAGNOSIS — Z79899 Other long term (current) drug therapy: Secondary | ICD-10-CM

## 2024-01-11 DIAGNOSIS — T8619 Other complication of kidney transplant: Secondary | ICD-10-CM | POA: Diagnosis present

## 2024-01-11 DIAGNOSIS — Z89511 Acquired absence of right leg below knee: Secondary | ICD-10-CM

## 2024-01-11 DIAGNOSIS — Y828 Other medical devices associated with adverse incidents: Secondary | ICD-10-CM | POA: Diagnosis present

## 2024-01-11 DIAGNOSIS — Y830 Surgical operation with transplant of whole organ as the cause of abnormal reaction of the patient, or of later complication, without mention of misadventure at the time of the procedure: Secondary | ICD-10-CM | POA: Diagnosis present

## 2024-01-11 DIAGNOSIS — I129 Hypertensive chronic kidney disease with stage 1 through stage 4 chronic kidney disease, or unspecified chronic kidney disease: Secondary | ICD-10-CM | POA: Diagnosis present

## 2024-01-11 DIAGNOSIS — E109 Type 1 diabetes mellitus without complications: Secondary | ICD-10-CM | POA: Diagnosis present

## 2024-01-11 DIAGNOSIS — B9689 Other specified bacterial agents as the cause of diseases classified elsewhere: Secondary | ICD-10-CM | POA: Diagnosis present

## 2024-01-11 DIAGNOSIS — B964 Proteus (mirabilis) (morganii) as the cause of diseases classified elsewhere: Secondary | ICD-10-CM | POA: Diagnosis present

## 2024-01-11 DIAGNOSIS — Z9641 Presence of insulin pump (external) (internal): Secondary | ICD-10-CM | POA: Diagnosis present

## 2024-01-11 DIAGNOSIS — N179 Acute kidney failure, unspecified: Secondary | ICD-10-CM | POA: Diagnosis not present

## 2024-01-11 DIAGNOSIS — B952 Enterococcus as the cause of diseases classified elsewhere: Secondary | ICD-10-CM | POA: Diagnosis present

## 2024-01-11 DIAGNOSIS — G629 Polyneuropathy, unspecified: Secondary | ICD-10-CM

## 2024-01-11 DIAGNOSIS — Z87440 Personal history of urinary (tract) infections: Secondary | ICD-10-CM

## 2024-01-11 DIAGNOSIS — E875 Hyperkalemia: Secondary | ICD-10-CM | POA: Diagnosis present

## 2024-01-11 DIAGNOSIS — K219 Gastro-esophageal reflux disease without esophagitis: Secondary | ICD-10-CM | POA: Diagnosis present

## 2024-01-11 DIAGNOSIS — Z8249 Family history of ischemic heart disease and other diseases of the circulatory system: Secondary | ICD-10-CM

## 2024-01-11 DIAGNOSIS — E032 Hypothyroidism due to medicaments and other exogenous substances: Secondary | ICD-10-CM | POA: Diagnosis present

## 2024-01-11 DIAGNOSIS — E039 Hypothyroidism, unspecified: Secondary | ICD-10-CM | POA: Diagnosis present

## 2024-01-11 DIAGNOSIS — D631 Anemia in chronic kidney disease: Secondary | ICD-10-CM | POA: Diagnosis present

## 2024-01-11 DIAGNOSIS — T83518A Infection and inflammatory reaction due to other urinary catheter, initial encounter: Secondary | ICD-10-CM | POA: Diagnosis present

## 2024-01-11 DIAGNOSIS — T83511A Infection and inflammatory reaction due to indwelling urethral catheter, initial encounter: Secondary | ICD-10-CM | POA: Diagnosis present

## 2024-01-11 DIAGNOSIS — E1065 Type 1 diabetes mellitus with hyperglycemia: Secondary | ICD-10-CM | POA: Diagnosis present

## 2024-01-11 DIAGNOSIS — Z79890 Hormone replacement therapy (postmenopausal): Secondary | ICD-10-CM

## 2024-01-11 DIAGNOSIS — Z66 Do not resuscitate: Secondary | ICD-10-CM | POA: Diagnosis present

## 2024-01-11 DIAGNOSIS — E1042 Type 1 diabetes mellitus with diabetic polyneuropathy: Secondary | ICD-10-CM | POA: Diagnosis present

## 2024-01-11 DIAGNOSIS — E872 Acidosis, unspecified: Secondary | ICD-10-CM | POA: Diagnosis present

## 2024-01-11 DIAGNOSIS — T368X5A Adverse effect of other systemic antibiotics, initial encounter: Secondary | ICD-10-CM | POA: Diagnosis present

## 2024-01-11 DIAGNOSIS — E119 Type 2 diabetes mellitus without complications: Secondary | ICD-10-CM

## 2024-01-11 DIAGNOSIS — L89153 Pressure ulcer of sacral region, stage 3: Secondary | ICD-10-CM | POA: Diagnosis present

## 2024-01-11 DIAGNOSIS — Z1622 Resistance to vancomycin related antibiotics: Secondary | ICD-10-CM | POA: Diagnosis present

## 2024-01-11 DIAGNOSIS — Z794 Long term (current) use of insulin: Secondary | ICD-10-CM

## 2024-01-11 DIAGNOSIS — Z978 Presence of other specified devices: Secondary | ICD-10-CM

## 2024-01-11 LAB — COMPREHENSIVE METABOLIC PANEL
ALT: 14 U/L (ref 0–44)
AST: 17 U/L (ref 15–41)
Albumin: 3.2 g/dL — ABNORMAL LOW (ref 3.5–5.0)
Alkaline Phosphatase: 71 U/L (ref 38–126)
Anion gap: 9 (ref 5–15)
BUN: 52 mg/dL — ABNORMAL HIGH (ref 8–23)
CO2: 17 mmol/L — ABNORMAL LOW (ref 22–32)
Calcium: 9.2 mg/dL (ref 8.9–10.3)
Chloride: 108 mmol/L (ref 98–111)
Creatinine, Ser: 1.66 mg/dL — ABNORMAL HIGH (ref 0.44–1.00)
GFR, Estimated: 34 mL/min — ABNORMAL LOW (ref >60)
Glucose, Bld: 342 mg/dL — ABNORMAL HIGH (ref 70–99)
Potassium: 4.8 mmol/L (ref 3.5–5.1)
Sodium: 134 mmol/L — ABNORMAL LOW (ref 135–145)
Total Bilirubin: 0.6 mg/dL (ref 0.0–1.2)
Total Protein: 7 g/dL (ref 6.5–8.1)

## 2024-01-11 LAB — BLOOD CULTURE ID PANEL (REFLEXED) - BCID2
A.calcoaceticus-baumannii: NOT DETECTED
A.calcoaceticus-baumannii: NOT DETECTED
Bacteroides fragilis: NOT DETECTED
Bacteroides fragilis: NOT DETECTED
Candida albicans: NOT DETECTED
Candida albicans: NOT DETECTED
Candida auris: NOT DETECTED
Candida auris: NOT DETECTED
Candida glabrata: NOT DETECTED
Candida glabrata: NOT DETECTED
Candida krusei: NOT DETECTED
Candida krusei: NOT DETECTED
Candida parapsilosis: NOT DETECTED
Candida parapsilosis: NOT DETECTED
Candida tropicalis: NOT DETECTED
Candida tropicalis: NOT DETECTED
Cryptococcus neoformans/gattii: NOT DETECTED
Cryptococcus neoformans/gattii: NOT DETECTED
Enterobacter cloacae complex: NOT DETECTED
Enterobacter cloacae complex: NOT DETECTED
Enterobacterales: NOT DETECTED
Enterobacterales: NOT DETECTED
Enterococcus Faecium: NOT DETECTED
Enterococcus Faecium: NOT DETECTED
Enterococcus faecalis: NOT DETECTED
Enterococcus faecalis: DETECTED — AB
Escherichia coli: NOT DETECTED
Escherichia coli: NOT DETECTED
Haemophilus influenzae: NOT DETECTED
Haemophilus influenzae: NOT DETECTED
Klebsiella aerogenes: NOT DETECTED
Klebsiella aerogenes: NOT DETECTED
Klebsiella oxytoca: NOT DETECTED
Klebsiella oxytoca: NOT DETECTED
Klebsiella pneumoniae: NOT DETECTED
Klebsiella pneumoniae: NOT DETECTED
Listeria monocytogenes: NOT DETECTED
Listeria monocytogenes: NOT DETECTED
Neisseria meningitidis: NOT DETECTED
Neisseria meningitidis: NOT DETECTED
Proteus species: NOT DETECTED
Proteus species: NOT DETECTED
Pseudomonas aeruginosa: NOT DETECTED
Pseudomonas aeruginosa: NOT DETECTED
Salmonella species: NOT DETECTED
Salmonella species: NOT DETECTED
Serratia marcescens: NOT DETECTED
Serratia marcescens: NOT DETECTED
Staphylococcus aureus (BCID): NOT DETECTED
Staphylococcus aureus (BCID): NOT DETECTED
Staphylococcus epidermidis: NOT DETECTED
Staphylococcus epidermidis: NOT DETECTED
Staphylococcus lugdunensis: NOT DETECTED
Staphylococcus lugdunensis: NOT DETECTED
Staphylococcus species: DETECTED — AB
Staphylococcus species: NOT DETECTED
Stenotrophomonas maltophilia: NOT DETECTED
Stenotrophomonas maltophilia: NOT DETECTED
Streptococcus agalactiae: NOT DETECTED
Streptococcus agalactiae: NOT DETECTED
Streptococcus pneumoniae: NOT DETECTED
Streptococcus pneumoniae: NOT DETECTED
Streptococcus pyogenes: NOT DETECTED
Streptococcus pyogenes: NOT DETECTED
Streptococcus species: NOT DETECTED
Streptococcus species: NOT DETECTED
Vancomycin resistance: NOT DETECTED

## 2024-01-11 LAB — CBC WITH DIFFERENTIAL/PLATELET
Abs Immature Granulocytes: 0.04 10*3/uL (ref 0.00–0.07)
Basophils Absolute: 0 10*3/uL (ref 0.0–0.1)
Basophils Relative: 0 %
Eosinophils Absolute: 0 10*3/uL (ref 0.0–0.5)
Eosinophils Relative: 0 %
HCT: 31 % — ABNORMAL LOW (ref 36.0–46.0)
Hemoglobin: 9.6 g/dL — ABNORMAL LOW (ref 12.0–15.0)
Immature Granulocytes: 1 %
Lymphocytes Relative: 12 %
Lymphs Abs: 0.8 10*3/uL (ref 0.7–4.0)
MCH: 33 pg (ref 26.0–34.0)
MCHC: 31 g/dL (ref 30.0–36.0)
MCV: 106.5 fL — ABNORMAL HIGH (ref 80.0–100.0)
Monocytes Absolute: 0.3 10*3/uL (ref 0.1–1.0)
Monocytes Relative: 5 %
Neutro Abs: 5.7 10*3/uL (ref 1.7–7.7)
Neutrophils Relative %: 82 %
Platelets: 171 10*3/uL (ref 150–400)
RBC: 2.91 MIL/uL — ABNORMAL LOW (ref 3.87–5.11)
RDW: 14.5 % (ref 11.5–15.5)
WBC: 7 10*3/uL (ref 4.0–10.5)
nRBC: 0 % (ref 0.0–0.2)

## 2024-01-11 LAB — URINALYSIS, ROUTINE W REFLEX MICROSCOPIC
Bacteria, UA: NONE SEEN
Bilirubin Urine: NEGATIVE
Glucose, UA: >=500 mg/dL — AB
Ketones, ur: NEGATIVE mg/dL
Nitrite: NEGATIVE
Protein, ur: 30 mg/dL — AB
Specific Gravity, Urine: 1.01 (ref 1.005–1.030)
WBC, UA: >50 WBC/hpf (ref 0–5)
pH: 6 (ref 5.0–8.0)

## 2024-01-11 LAB — URINE CULTURE: Culture: >=100000 — AB

## 2024-01-11 NOTE — Progress Notes (Signed)
       Overnight   NAME: [name] MRN: [id number] DOB : [birth year]    Date of Service   01/11/2024   HPI/Events of Note   Notified by Micro of positive blood culture. Results will be available Staphylococcus species DETECTED Abnormal   1 of 4 reported by Micro   Interventions/ Plan   Notified prior Attending by Secure chat on 01/11/2024 @ 0014 Dr Delphia Ficks MD      Denece Finger BSN MSNA MSN ACNPC-AG Acute Care Nurse Practitioner [facility]

## 2024-01-11 NOTE — ED Triage Notes (Signed)
 Pt referred to ED for positive blood cultures and needing IV abx.  Patient denies fever.  Patient reports started on abx and started having diarrhea this am

## 2024-01-11 NOTE — Progress Notes (Signed)
  PROGRESS NOTE  [name]  FMW:[id number] DOB: [birth year] DOA: 01/09/2024   Brief Progress Note:  Informed by o/n NP that patient's blood culture returned o/n as staph in 1/4 blood culture bottles.  Also called by micro this AM that patient's second bottle returned positive for enterococcus.  On my review, urine culture also now positive for enterococcus as well.  Called and relayed message to patient.  She continues to feel great.  Plan will be for her to return to ER to be re-admitted for IV abx.  Disappointed with the plan but understanding.  She plans to come straight over.    Alerted Charge nurse in ED.  Also spoke with ID at [facility] Dr. Dennise as an update as well.   Reyes VEAR Gaw, MD [facility] [email or web address] 01/11/2024, 12:20 PM

## 2024-01-11 NOTE — ED Provider Triage Note (Signed)
Emergency Medicine Provider Triage Evaluation Note  [name] , a 63 y.o. female  was evaluated in triage.  Pt complains of needing IV antibiotics after blood cultures returned, called by Dr. Gwendolyn Grant. Was previously discharged on 01/09/2024 after being treated for sepsis. Pt has been taking bactrim. Indwelling cath present at this time.  Review of Systems  Positive: Diarrhea Negative: Fevers, headaches, chest pain, shortness of breath, abdominal pain, n/v, dysuria  Physical Exam  BP (!) 143/86   Pulse 83   Temp 97.8 F (36.6 C)   Resp 16   Wt 59 kg   SpO2 100%   BMI 23.04 kg/m  Gen:   Awake, no distress   Resp:  Normal effort  MSK:   Moves extremities without difficulty  Other:    Medical Decision Making  Medically screening exam initiated at 5:30 PM.  Appropriate orders placed.  [name] was informed that the remainder of the evaluation will be completed by another provider, this initial triage assessment does not replace that evaluation, and the importance of remaining in the ED until their evaluation is complete.     Lunette Stands, [place] 01/11/24 1735

## 2024-01-12 ENCOUNTER — Encounter (HOSPITAL_COMMUNITY): Payer: Self-pay | Admitting: Internal Medicine

## 2024-01-12 DIAGNOSIS — B964 Proteus (mirabilis) (morganii) as the cause of diseases classified elsewhere: Secondary | ICD-10-CM

## 2024-01-12 DIAGNOSIS — Y830 Surgical operation with transplant of whole organ as the cause of abnormal reaction of the patient, or of later complication, without mention of misadventure at the time of the procedure: Secondary | ICD-10-CM | POA: Diagnosis present

## 2024-01-12 DIAGNOSIS — E119 Type 2 diabetes mellitus without complications: Secondary | ICD-10-CM

## 2024-01-12 DIAGNOSIS — B9689 Other specified bacterial agents as the cause of diseases classified elsewhere: Secondary | ICD-10-CM | POA: Diagnosis present

## 2024-01-12 DIAGNOSIS — T83511A Infection and inflammatory reaction due to indwelling urethral catheter, initial encounter: Secondary | ICD-10-CM | POA: Diagnosis present

## 2024-01-12 DIAGNOSIS — T83518A Infection and inflammatory reaction due to other urinary catheter, initial encounter: Secondary | ICD-10-CM | POA: Diagnosis present

## 2024-01-12 DIAGNOSIS — E1022 Type 1 diabetes mellitus with diabetic chronic kidney disease: Secondary | ICD-10-CM

## 2024-01-12 DIAGNOSIS — Z978 Presence of other specified devices: Secondary | ICD-10-CM | POA: Diagnosis not present

## 2024-01-12 DIAGNOSIS — Z794 Long term (current) use of insulin: Secondary | ICD-10-CM | POA: Diagnosis not present

## 2024-01-12 DIAGNOSIS — N1832 Chronic kidney disease, stage 3b: Secondary | ICD-10-CM

## 2024-01-12 DIAGNOSIS — E860 Dehydration: Secondary | ICD-10-CM | POA: Diagnosis present

## 2024-01-12 DIAGNOSIS — G629 Polyneuropathy, unspecified: Secondary | ICD-10-CM

## 2024-01-12 DIAGNOSIS — E039 Hypothyroidism, unspecified: Secondary | ICD-10-CM | POA: Diagnosis present

## 2024-01-12 DIAGNOSIS — E1042 Type 1 diabetes mellitus with diabetic polyneuropathy: Secondary | ICD-10-CM | POA: Diagnosis present

## 2024-01-12 DIAGNOSIS — E872 Acidosis, unspecified: Secondary | ICD-10-CM | POA: Diagnosis present

## 2024-01-12 DIAGNOSIS — I38 Endocarditis, valve unspecified: Secondary | ICD-10-CM | POA: Diagnosis not present

## 2024-01-12 DIAGNOSIS — D631 Anemia in chronic kidney disease: Secondary | ICD-10-CM | POA: Diagnosis present

## 2024-01-12 DIAGNOSIS — L89151 Pressure ulcer of sacral region, stage 1: Secondary | ICD-10-CM

## 2024-01-12 DIAGNOSIS — Z89511 Acquired absence of right leg below knee: Secondary | ICD-10-CM | POA: Diagnosis not present

## 2024-01-12 DIAGNOSIS — R7881 Bacteremia: Secondary | ICD-10-CM | POA: Diagnosis present

## 2024-01-12 DIAGNOSIS — B952 Enterococcus as the cause of diseases classified elsewhere: Secondary | ICD-10-CM

## 2024-01-12 DIAGNOSIS — Z1622 Resistance to vancomycin related antibiotics: Secondary | ICD-10-CM | POA: Diagnosis present

## 2024-01-12 DIAGNOSIS — I10 Essential (primary) hypertension: Secondary | ICD-10-CM

## 2024-01-12 DIAGNOSIS — B957 Other staphylococcus as the cause of diseases classified elsewhere: Secondary | ICD-10-CM | POA: Diagnosis not present

## 2024-01-12 DIAGNOSIS — Z940 Kidney transplant status: Secondary | ICD-10-CM | POA: Diagnosis not present

## 2024-01-12 DIAGNOSIS — N390 Urinary tract infection, site not specified: Secondary | ICD-10-CM | POA: Diagnosis present

## 2024-01-12 DIAGNOSIS — Y846 Urinary catheterization as the cause of abnormal reaction of the patient, or of later complication, without mention of misadventure at the time of the procedure: Secondary | ICD-10-CM | POA: Diagnosis present

## 2024-01-12 DIAGNOSIS — L89153 Pressure ulcer of sacral region, stage 3: Secondary | ICD-10-CM | POA: Diagnosis present

## 2024-01-12 DIAGNOSIS — I129 Hypertensive chronic kidney disease with stage 1 through stage 4 chronic kidney disease, or unspecified chronic kidney disease: Secondary | ICD-10-CM | POA: Diagnosis present

## 2024-01-12 DIAGNOSIS — Y828 Other medical devices associated with adverse incidents: Secondary | ICD-10-CM | POA: Diagnosis present

## 2024-01-12 DIAGNOSIS — Z66 Do not resuscitate: Secondary | ICD-10-CM | POA: Diagnosis present

## 2024-01-12 DIAGNOSIS — T8619 Other complication of kidney transplant: Secondary | ICD-10-CM | POA: Diagnosis present

## 2024-01-12 DIAGNOSIS — Z431 Encounter for attention to gastrostomy: Secondary | ICD-10-CM | POA: Diagnosis not present

## 2024-01-12 DIAGNOSIS — N179 Acute kidney failure, unspecified: Secondary | ICD-10-CM | POA: Diagnosis not present

## 2024-01-12 DIAGNOSIS — E1065 Type 1 diabetes mellitus with hyperglycemia: Secondary | ICD-10-CM | POA: Diagnosis present

## 2024-01-12 LAB — CBC
HCT: 30.4 % — ABNORMAL LOW (ref 36.0–46.0)
Hemoglobin: 9.3 g/dL — ABNORMAL LOW (ref 12.0–15.0)
MCH: 32.7 pg (ref 26.0–34.0)
MCHC: 30.6 g/dL (ref 30.0–36.0)
MCV: 107 fL — ABNORMAL HIGH (ref 80.0–100.0)
Platelets: 192 10*3/uL (ref 150–400)
RBC: 2.84 MIL/uL — ABNORMAL LOW (ref 3.87–5.11)
RDW: 14.6 % (ref 11.5–15.5)
WBC: 6.6 10*3/uL (ref 4.0–10.5)
nRBC: 0 % (ref 0.0–0.2)

## 2024-01-12 LAB — COMPREHENSIVE METABOLIC PANEL
ALT: 12 U/L (ref 0–44)
AST: 15 U/L (ref 15–41)
Albumin: 2.9 g/dL — ABNORMAL LOW (ref 3.5–5.0)
Alkaline Phosphatase: 69 U/L (ref 38–126)
Anion gap: 9 (ref 5–15)
BUN: 50 mg/dL — ABNORMAL HIGH (ref 8–23)
CO2: 17 mmol/L — ABNORMAL LOW (ref 22–32)
Calcium: 8.8 mg/dL — ABNORMAL LOW (ref 8.9–10.3)
Chloride: 106 mmol/L (ref 98–111)
Creatinine, Ser: 1.78 mg/dL — ABNORMAL HIGH (ref 0.44–1.00)
GFR, Estimated: 31 mL/min — ABNORMAL LOW (ref >60)
Glucose, Bld: 261 mg/dL — ABNORMAL HIGH (ref 70–99)
Potassium: 4.5 mmol/L (ref 3.5–5.1)
Sodium: 132 mmol/L — ABNORMAL LOW (ref 135–145)
Total Bilirubin: 0.3 mg/dL (ref 0.0–1.2)
Total Protein: 6.2 g/dL — ABNORMAL LOW (ref 6.5–8.1)

## 2024-01-12 LAB — GLUCOSE, CAPILLARY
Glucose-Capillary: 227 mg/dL — ABNORMAL HIGH (ref 70–99)
Glucose-Capillary: 231 mg/dL — ABNORMAL HIGH (ref 70–99)
Glucose-Capillary: 187 mg/dL — ABNORMAL HIGH (ref 70–99)
Glucose-Capillary: 276 mg/dL — ABNORMAL HIGH (ref 70–99)

## 2024-01-12 MED ORDER — SODIUM BICARBONATE 650 MG PO TABS: 650.0000 mg | ORAL_TABLET | Freq: Two times a day (BID) | ORAL | Status: DC

## 2024-01-12 MED ORDER — PREDNISONE 5 MG PO TABS
5.0000 mg | ORAL_TABLET | Freq: Every day | ORAL | Status: DC
  Administered 2024-01-12 – 2024-01-17 (×6): 5 mg via ORAL
  Filled 2024-01-12 (×6): qty 1

## 2024-01-12 MED ORDER — SODIUM BICARBONATE 650 MG PO TABS
650.0000 mg | ORAL_TABLET | Freq: Three times a day (TID) | ORAL | Status: DC
  Administered 2024-01-12 – 2024-01-17 (×17): 650 mg via ORAL
  Filled 2024-01-12 (×17): qty 1

## 2024-01-12 MED ORDER — SODIUM CHLORIDE 0.9 % IV SOLN: 1.0000 g | Freq: Once | INTRAVENOUS | Status: DC

## 2024-01-12 MED ORDER — SODIUM CHLORIDE 0.9% FLUSH
3.0000 mL | Freq: Two times a day (BID) | INTRAVENOUS | Status: DC
  Administered 2024-01-12 – 2024-01-16 (×7): 3 mL via INTRAVENOUS

## 2024-01-12 MED ORDER — ACETAMINOPHEN 325 MG PO TABS
650.0000 mg | ORAL_TABLET | Freq: Four times a day (QID) | ORAL | Status: DC | PRN
  Administered 2024-01-17: 650 mg via ORAL
  Filled 2024-01-12: qty 2

## 2024-01-12 MED ORDER — SODIUM CHLORIDE 0.9% FLUSH: 3.0000 mL | INTRAVENOUS | Status: DC | PRN

## 2024-01-12 MED ORDER — INSULIN PUMP: Freq: Three times a day (TID) | SUBCUTANEOUS | Status: DC

## 2024-01-12 MED ORDER — INSULIN ASPART 100 UNIT/ML IJ SOLN: 100.0000 [IU] | INTRAMUSCULAR | Status: DC

## 2024-01-12 MED ORDER — ACETAMINOPHEN 650 MG RE SUPP: 650.0000 mg | Freq: Four times a day (QID) | RECTAL | Status: DC | PRN

## 2024-01-12 MED ORDER — SODIUM CHLORIDE 0.9% FLUSH
3.0000 mL | Freq: Two times a day (BID) | INTRAVENOUS | Status: DC
  Administered 2024-01-14 – 2024-01-16 (×4): 3 mL via INTRAVENOUS

## 2024-01-12 MED ORDER — SODIUM CHLORIDE 0.9 % IV SOLN
2.0000 g | Freq: Three times a day (TID) | INTRAVENOUS | Status: DC
  Administered 2024-01-12 – 2024-01-15 (×11): 2 g via INTRAVENOUS
  Filled 2024-01-12 (×13): qty 2000

## 2024-01-12 MED ORDER — LISINOPRIL 10 MG PO TABS
5.0000 mg | ORAL_TABLET | Freq: Every day | ORAL | Status: DC
Start: 2024-01-12 — End: 2024-01-12

## 2024-01-12 MED ORDER — CHLORHEXIDINE GLUCONATE CLOTH 2 % EX PADS
6.0000 | MEDICATED_PAD | Freq: Every day | CUTANEOUS | Status: DC
  Administered 2024-01-12 – 2024-01-15 (×4): 6 via TOPICAL

## 2024-01-12 MED ORDER — ONDANSETRON HCL 4 MG/2ML IJ SOLN
4.0000 mg | Freq: Four times a day (QID) | INTRAMUSCULAR | Status: DC | PRN
Start: 2024-01-12 — End: 2024-01-17

## 2024-01-12 MED ORDER — SODIUM CHLORIDE 0.9 % IV SOLN: 250.0000 mL | INTRAVENOUS | Status: AC | PRN

## 2024-01-12 MED ORDER — HEPARIN SODIUM (PORCINE) 5000 UNIT/ML IJ SOLN
5000.0000 [IU] | Freq: Three times a day (TID) | INTRAMUSCULAR | Status: DC
  Administered 2024-01-12 – 2024-01-17 (×16): 5000 [IU] via SUBCUTANEOUS
  Filled 2024-01-12 (×17): qty 1

## 2024-01-12 MED ORDER — CYCLOSPORINE MODIFIED (NEORAL) 25 MG PO CAPS
75.0000 mg | ORAL_CAPSULE | Freq: Two times a day (BID) | ORAL | Status: DC
  Administered 2024-01-12 – 2024-01-17 (×12): 75 mg via ORAL
  Filled 2024-01-12 (×13): qty 3

## 2024-01-12 MED ORDER — INSULIN PUMP
Freq: Three times a day (TID) | SUBCUTANEOUS | Status: DC
  Administered 2024-01-12: 2.4 via SUBCUTANEOUS
  Administered 2024-01-13 (×2): 3.63 via SUBCUTANEOUS
  Administered 2024-01-13: 2.7 via SUBCUTANEOUS
  Administered 2024-01-13: 5 via SUBCUTANEOUS
  Administered 2024-01-14: 3.46 via SUBCUTANEOUS
  Filled 2024-01-12: qty 1

## 2024-01-12 MED ORDER — SODIUM CHLORIDE 0.9 % IV SOLN
2.0000 g | Freq: Three times a day (TID) | INTRAVENOUS | Status: DC
  Filled 2024-01-12: qty 2000

## 2024-01-12 MED ORDER — AZATHIOPRINE 50 MG PO TABS
50.0000 mg | ORAL_TABLET | Freq: Every day | ORAL | Status: DC
  Administered 2024-01-12 – 2024-01-17 (×6): 50 mg via ORAL
  Filled 2024-01-12 (×6): qty 1

## 2024-01-12 MED ORDER — LEVOTHYROXINE SODIUM 100 MCG PO TABS
100.0000 ug | ORAL_TABLET | Freq: Every day | ORAL | Status: DC
  Administered 2024-01-12: 100 ug via ORAL
  Filled 2024-01-12 (×5): qty 1

## 2024-01-12 MED ORDER — ONDANSETRON HCL 4 MG PO TABS: 4.0000 mg | ORAL_TABLET | Freq: Four times a day (QID) | ORAL | Status: DC | PRN

## 2024-01-12 NOTE — Consult Note (Signed)
[facility]    Date of Admission:  01/11/2024   Total days of inpatient antibiotics 1        Reason for Consult: E faecalis bacteremia    Principal Problem:   Gram-positive bacteremia Active Problems:   History of renal transplant   CKD stage 3b, GFR 30-44 ml/min (HCC)   Hypothyroidism   Insulin dependent type 1 diabetes mellitus (HCC)   Chronic indwelling Foley catheter   Pressure ulcer   Peripheral neuropathy   Assessment: 63 year old female who was discharged on 1/14 called back to the ED for positive blood cultures #E faecalis Staph hominis blood cultures #Proteus mirabilis UTI - Patient presented to the ED initially on 1/14 with her Foley reportedly blocked.  She had a temp of 102 in the ED.  Started on ceftriaxone.  Foley exchanged patient felt better.  Urine cultures grew (mirabilis discharged on Bactrim complete 7 days. - Call back on 1/15 as blood cultures grew E faecalis.  Blood cultures on 1/14, 1/4 bottles growing Staph hominis and E faecalis.  Unclear if this is true bacteremia.  Will continue ampicillin.  Follow repeat blood cultures.  Bactrim and ceftriaxone not have effectively cleared bacteremia.  If repeat blood cultures are negative, TTE negative consider contaminant.  Recommendations:  -Repeat blood Cx -Continue ampicllin(covers proteus as well) -TTE Microbiology:   Antibiotics: Ceftriaxone 1/13-14  Ampicillin 1/15-   Cultures: Blood 1/13 1/4 bottles e faecalis and staph hominis Urine  Other   HPI: [name] is a 63 y.o. female past medical history of chronic indwelling Foley catheter, diabetes mellitus, gout, renal transplant on Imuran complicated with CKD hypothyroidism, right-sided BKA, chronic pressure ulcers, generalized deconditioning, peripheral nephropathy recently admitted 1/14-15 for UTI secondary to Proteus mirabilis she was treated with ceftriaxone and discharged on Bactrim to complete 7 days  antibiotics.  She was called back to the ED due to blood cultures being positive for E faecalis.  Patient states she feels significantly improved, no new complaints today.   Review of Systems: Review of Systems  All other systems reviewed and are negative.   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   DDD (degenerative disc disease), lumbar    Diabetes mellitus without complication (HCC)    type 1   Diabetic ketoacidosis (HCC) 09/26/2020   Gait disorder    neurogenic   Gangrene of right foot (HCC)    Hypertension    Hypothyroid 12/08/2020   Neuropathy    Renal disease    Sacral decubitus ulcer, stage IV (HCC) 12/08/2020   Subacute osteomyelitis of right foot (HCC)     Social History   Tobacco Use   Smoking status: Never   Smokeless tobacco: Never  Vaping Use   Vaping status: Never Used  Substance Use Topics   Alcohol use: Never   Drug use: Never    Family History  Problem Relation Age of Onset   Hypertension Mother    Hypertension Father    Scheduled Meds:  azaTHIOprine  50 mg Oral Daily   Chlorhexidine Gluconate Cloth  6 each Topical Daily   cycloSPORINE modified  75 mg Oral BID   heparin  5,000 Units Subcutaneous Q8H   insulin pump   Subcutaneous TID WC, HS, 0200   levothyroxine  100 mcg Oral Q0600  predniSONE  5 mg Oral Q breakfast   sodium bicarbonate  650 mg Oral TID   sodium chloride flush  3 mL Intravenous Q12H   sodium chloride flush  3 mL Intravenous Q12H   Continuous Infusions:  sodium chloride     ampicillin (OMNIPEN) IV 2 g (01/12/24 1109)   PRN Meds:.sodium chloride, acetaminophen **OR** acetaminophen, ondansetron **OR** ondansetron (ZOFRAN) IV, sodium chloride flush No Known Allergies  OBJECTIVE: Blood pressure (!) 151/80, pulse 72, temperature 97.7 F (36.5 C), resp. rate 16, weight 59 kg, SpO2  100%.  Physical Exam Constitutional:      Appearance: Normal appearance.  HENT:     Head: Normocephalic and atraumatic.     Right Ear: Tympanic membrane normal.     Left Ear: Tympanic membrane normal.     Nose: Nose normal.     Mouth/Throat:     Mouth: Mucous membranes are moist.  Eyes:     Extraocular Movements: Extraocular movements intact.     Conjunctiva/sclera: Conjunctivae normal.     Pupils: Pupils are equal, round, and reactive to light.  Cardiovascular:     Rate and Rhythm: Normal rate and regular rhythm.     Heart sounds: No murmur heard.    No friction rub. No gallop.  Pulmonary:     Effort: Pulmonary effort is normal.     Breath sounds: Normal breath sounds.  Abdominal:     General: Abdomen is flat.     Palpations: Abdomen is soft.  Skin:    General: Skin is warm and dry.  Neurological:     General: No focal deficit present.     Mental Status: She is alert and oriented to person, place, and time.  Psychiatric:        Mood and Affect: Mood normal.     Lab Results Lab Results  Component Value Date   WBC 6.6 01/12/2024   HGB 9.3 (L) 01/12/2024   HCT 30.4 (L) 01/12/2024   MCV 107.0 (H) 01/12/2024   PLT 192 01/12/2024    Lab Results  Component Value Date   CREATININE 1.78 (H) 01/12/2024   BUN 50 (H) 01/12/2024   NA 132 (L) 01/12/2024   K 4.5 01/12/2024   CL 106 01/12/2024   CO2 17 (L) 01/12/2024    Lab Results  Component Value Date   ALT 12 01/12/2024   AST 15 01/12/2024   ALKPHOS 69 01/12/2024   BILITOT 0.3 01/12/2024       Danelle Earthly, MD [facility] 01/12/2024, 11:09 AM I have personally spent 82 minutes involved in face-to-face and non-face-to-face activities for this patient on the day of the visit. Professional time spent includes the following activities: Preparing to see the patient (review of tests), Obtaining and/or reviewing separately obtained history (admission/discharge  record), Performing a medically appropriate examination and/or evaluation , Ordering medications/tests/procedures, referring and communicating with other health care professionals, Documenting clinical information in the EMR, Independently interpreting results (not separately reported), Communicating results to the patient/family/caregiver, Counseling and educating the patient/family/caregiver and Care coordination (not separately reported).

## 2024-01-12 NOTE — Inpatient Diabetes Management (Signed)
Inpatient Diabetes Program Recommendations  AACE/ADA: New Consensus Statement on Inpatient Glycemic Control (2015)  Target Ranges:  Prepandial:   less than 140 mg/dL      Peak postprandial:   less than 180 mg/dL (1-2 hours)      Critically ill patients:  140 - 180 mg/dL   Lab Results  Component Value Date   GLUCAP 227 (H) 01/12/2024   HGBA1C 7.2 (H) 01/09/2024   Review of Glycemic Control   Diabetes history: DM1 Outpatient Diabetes medications: OmniPod insulin pump with Dexcom G6,uses Humalog, if pump not on, Lantus 14 daily Current orders for Inpatient glycemic control: Insulin pump OmniPod, Prednisone 5 mg with breakfast   Current Basal--total 7.05 units/24 ICR-1 units for every 15 carb Insulin sensitivity factor-60 mg/dL (1 unit drops her 60 points Target BG-12am-8am--130                        8am-5pm-140                        5pm-MN-130   HgbA1C 7.2% Endo - Dr Shawnee Knapp Last OV - 10/26/23  Hospitalized for IV antibiotics  CBGs 261, 227 mg/dL  Inpatient Diabetes Program Recommendations  If pump is removed, please order:  Semglee 10 daily Novolog 0-6 TID and 0-5 HS Novolog 3 units TID with meals if eating > 50%  Continue to follow.  Thank you. Ailene Ards, RD, LDN, CDCES Inpatient Diabetes Coordinator [phone]

## 2024-01-12 NOTE — ED Notes (Signed)
ED TO INPATIENT HANDOFF REPORT  ED Nurse Name and Phone #: Suann Larry Name/Age/Gender [name] 63 y.o. female Room/Bed: WA12/WA12  Code Status   Code Status: Limited: Do not attempt resuscitation (DNR) -DNR-LIMITED -Do Not Intubate/DNI   Home/SNF/Other Home Patient oriented to: self, place, time, and situation Is this baseline? Yes   Triage Complete: Triage complete  Chief Complaint Gram-positive bacteremia [R78.81]  Triage Note Pt referred to ED for positive blood cultures and needing IV abx.  Patient denies fever.  Patient reports started on abx and started having diarrhea this am   Allergies No Known Allergies  Level of Care/Admitting Diagnosis ED Disposition     ED Disposition  Admit   Condition  --   Comment  Hospital Area: [facility] [[id number]]  Level of Care: Med-Surg [16]  May admit patient to [facility] or [facility] if equivalent level of care is available:: No  Covid Evaluation: Asymptomatic - no recent exposure (last 10 days) testing not required  Diagnosis: Gram-positive bacteremia [413244]  Admitting Physician: Tereasa Coop [[id number]]  Attending Physician: Tereasa Coop [[id number]]  Certification:: I certify this patient will need inpatient services for at least 2 midnights  Expected Medical Readiness: 01/18/2024          B Medical/Surgery History 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   DDD (degenerative disc disease), lumbar    Diabetes mellitus without complication (HCC)    type 1   Diabetic ketoacidosis (HCC) 09/26/2020   Gait disorder    neurogenic   Gangrene of right foot (HCC)    Hypertension    Hypothyroid 12/08/2020   Neuropathy    Renal disease    Sacral decubitus ulcer, stage IV (HCC) 12/08/2020   Subacute osteomyelitis of right foot [facility])    Past Surgical  History:  Procedure Laterality Date   AMPUTATION Right 10/01/2020   Procedure: RIGHT BELOW KNEE AMPUTATION;  Surgeon: Nadara Mustard, MD;  Location[facility] OR;  Service: Orthopedics;  Laterality: Right;   BLADDER SURGERY     ESOPHAGOGASTRODUODENOSCOPY (EGD) WITH PROPOFOL N/A 11/10/2020   Procedure: ESOPHAGOGASTRODUODENOSCOPY (EGD) WITH PROPOFOL;  Surgeon: Hilarie Fredrickson, MD;  Location: [facility] ENDOSCOPY;  Service: Endoscopy;  Laterality: N/A;   IR GASTROSTOMY TUBE REMOVAL  03/25/2021   KIDNEY TRANSPLANT     LEG SURGERY     tib-fib fx , ORIF     A IV Location/Drains/Wounds Patient Lines/Drains/Airways Status     Active Line/Drains/Airways     Name Placement date Placement time Site Days   Peripheral IV 01/12/24 20 G Left Antecubital 01/12/24  0202  Antecubital  less than 1   Gastrostomy/Enterostomy PEG-jejunostomy LUQ --  --  LUQ  --   Urethral Catheter [name], RN Non-latex 16 Fr. 12/18/23  1721  Non-latex  25   Urethral Catheter 01/09/24  1622  --  3   Pressure Injury 12/10/20 Heel Left;Posterior Stage 2 -  Partial thickness loss of dermis presenting as a shallow open injury with a red, pink wound bed without slough. 12/10/20  1325  -- 1128   Pressure Injury 12/24/20 Sacrum Stage 4 - Full thickness tissue loss with exposed bone, tendon or muscle. 12/24/20  --  -- 1114   Pressure Injury 02/26/21 Buttocks Right Stage 2 -  Partial thickness loss of dermis presenting as a shallow open injury  with a red, pink wound bed without slough. 02/26/21  0600  -- 1050   Pressure Injury 01/10/24 Sacrum Stage 3 -  Full thickness tissue loss. Subcutaneous fat may be visible but bone, tendon or muscle are NOT exposed. 01/10/24  --  -- 2   Wound / Incision (Open or Dehisced) 11/12/20 (MASD) Moisture Associated Skin Damage Buttocks Right;Left 11/12/20  0944  Buttocks  1156   Wound / Incision (Open or Dehisced) 01/10/24 Other (Comment) Back Mid 01/10/24  --  Back  2            Intake/Output Last 24  hours  Intake/Output Summary (Last 24 hours) at 01/12/2024 0821 Last data filed at 01/12/2024 0316 Gross per 24 hour  Intake 100 ml  Output --  Net 100 ml    Labs/Imaging Results for orders placed or performed during the hospital encounter of 01/11/24 (from the past 48 hours)  CBC with Differential     Status: Abnormal   Collection Time: 01/11/24  5:56 PM  Result Value Ref Range   WBC 7.0 4.0 - 10.5 K/uL   RBC 2.91 (L) 3.87 - 5.11 MIL/uL   Hemoglobin 9.6 (L) 12.0 - 15.0 g/dL   HCT 16.1 (L) 09.6 - 04.5 %   MCV 106.5 (H) 80.0 - 100.0 fL   MCH 33.0 26.0 - 34.0 pg   MCHC 31.0 30.0 - 36.0 g/dL   RDW 40.9 81.1 - 91.4 %   Platelets 171 150 - 400 K/uL   nRBC 0.0 0.0 - 0.2 %   Neutrophils Relative % 82 %   Neutro Abs 5.7 1.7 - 7.7 K/uL   Lymphocytes Relative 12 %   Lymphs Abs 0.8 0.7 - 4.0 K/uL   Monocytes Relative 5 %   Monocytes Absolute 0.3 0.1 - 1.0 K/uL   Eosinophils Relative 0 %   Eosinophils Absolute 0.0 0.0 - 0.5 K/uL   Basophils Relative 0 %   Basophils Absolute 0.0 0.0 - 0.1 K/uL   Immature Granulocytes 1 %   Abs Immature Granulocytes 0.04 0.00 - 0.07 K/uL    Comment: Performed at [facility], [address], [place], [place] [address]  Comprehensive metabolic panel     Status: Abnormal   Collection Time: 01/11/24  5:56 PM  Result Value Ref Range   Sodium 134 (L) 135 - 145 mmol/L   Potassium 4.8 3.5 - 5.1 mmol/L   Chloride 108 98 - 111 mmol/L   CO2 17 (L) 22 - 32 mmol/L   Glucose, Bld 342 (H) 70 - 99 mg/dL    Comment: Glucose reference range applies only to samples taken after fasting for at least 8 hours.   BUN 52 (H) 8 - 23 mg/dL   Creatinine, Ser 6.21 (H) 0.44 - 1.00 mg/dL   Calcium 9.2 8.9 - 30.8 mg/dL   Total Protein 7.0 6.5 - 8.1 g/dL   Albumin 3.2 (L) 3.5 - 5.0 g/dL   AST 17 15 - 41 U/L   ALT 14 0 - 44 U/L   Alkaline Phosphatase 71 38 - 126 U/L   Total Bilirubin 0.6 0.0 - 1.2 mg/dL   GFR, Estimated 34 (L) >60 mL/min    Comment:  (NOTE) Calculated using the CKD-EPI Creatinine Equation (2021)    Anion gap 9 5 - 15    Comment: Performed at [facility], [address] [place], [place] [address]  Urinalysis, Routine w reflex microscopic -Urine, Clean Catch     Status: Abnormal  Collection Time: 01/11/24  8:41 PM  Result Value Ref Range   Color, Urine YELLOW YELLOW   APPearance CLEAR CLEAR   Specific Gravity, Urine 1.010 1.005 - 1.030   pH 6.0 5.0 - 8.0   Glucose, UA >=500 (A) NEGATIVE mg/dL   Hgb urine dipstick SMALL (A) NEGATIVE   Bilirubin Urine NEGATIVE NEGATIVE   Ketones, ur NEGATIVE NEGATIVE mg/dL   Protein, ur 30 (A) NEGATIVE mg/dL   Nitrite NEGATIVE NEGATIVE   Leukocytes,Ua LARGE (A) NEGATIVE   RBC / HPF 0-5 0 - 5 RBC/hpf   WBC, UA >50 0 - 5 WBC/hpf   Bacteria, UA NONE SEEN NONE SEEN   Squamous Epithelial / HPF 0-5 0 - 5 /HPF   WBC Clumps PRESENT    Mucus PRESENT     Comment: Performed at [facility], [address], [place], [place] [address]  Culture, blood (Routine X 2) w Reflex to ID Panel     Status: None (Preliminary result)   Collection Time: 01/12/24  1:57 AM   Specimen: BLOOD  Result Value Ref Range   Specimen Description      BLOOD LEFT ANTECUBITAL Performed at [facility], [address] [place], [place] [address]    Special Requests      BOTTLES DRAWN AEROBIC AND ANAEROBIC Blood Culture results may not be optimal due to an inadequate volume of blood received in culture bottles Performed at [facility], [address], [place], [place] [address]    Culture      NO GROWTH <12 HOURS Performed at [facility], [address] [place], [place] [address]    Report Status PENDING   Culture, blood (Routine X 2) w Reflex to ID Panel     Status: None (Preliminary result)   Collection Time: 01/12/24  2:04 AM   Specimen: BLOOD  Result Value Ref Range   Specimen Description      BLOOD BLOOD LEFT  FOREARM Performed at [facility], [address] [place], [place] [address]    Special Requests      BOTTLES DRAWN AEROBIC AND ANAEROBIC Blood Culture results may not be optimal due to an inadequate volume of blood received in culture bottles Performed at [facility], [address] [place], [place] [address]    Culture      NO GROWTH <12 HOURS Performed at [facility], [address], [place], [place] [address]    Report Status PENDING   CBC     Status: Abnormal   Collection Time: 01/12/24  2:04 AM  Result Value Ref Range   WBC 6.6 4.0 - 10.5 K/uL   RBC 2.84 (L) 3.87 - 5.11 MIL/uL   Hemoglobin 9.3 (L) 12.0 - 15.0 g/dL   HCT 62.3 (L) 76.2 - 83.1 %   MCV 107.0 (H) 80.0 - 100.0 fL   MCH 32.7 26.0 - 34.0 pg   MCHC 30.6 30.0 - 36.0 g/dL   RDW 51.7 61.6 - 07.3 %   Platelets 192 150 - 400 K/uL   nRBC 0.0 0.0 - 0.2 %    Comment: Performed at [facility], [address], [place], [place] [address]  Comprehensive metabolic panel     Status: Abnormal   Collection Time: 01/12/24  2:04 AM  Result Value Ref Range   Sodium 132 (L) 135 - 145 mmol/L   Potassium 4.5 3.5 - 5.1 mmol/L   Chloride 106  98 - 111 mmol/L   CO2 17 (L) 22 - 32 mmol/L   Glucose, Bld 261 (H) 70 - 99 mg/dL    Comment: Glucose reference range applies only to samples taken after fasting for at least 8 hours.   BUN 50 (H) 8 - 23 mg/dL   Creatinine, Ser 6.44 (H) 0.44 - 1.00 mg/dL   Calcium 8.8 (L) 8.9 - 10.3 mg/dL   Total Protein 6.2 (L) 6.5 - 8.1 g/dL   Albumin 2.9 (L) 3.5 - 5.0 g/dL   AST 15 15 - 41 U/L   ALT 12 0 - 44 U/L   Alkaline Phosphatase 69 38 - 126 U/L   Total Bilirubin 0.3 0.0 - 1.2 mg/dL   GFR, Estimated 31 (L) >60 mL/min    Comment: (NOTE) Calculated using the CKD-EPI Creatinine Equation (2021)    Anion gap 9 5 - 15    Comment: Performed at [facility], [address] [place], [place] [address]   No results  found.  Pending Labs Unresulted Labs (From admission, onward)     Start     Ordered   01/13/24 0500  Comprehensive metabolic panel  Tomorrow morning,   R        01/12/24 0131   01/13/24 0500  CBC  Tomorrow morning,   R        01/12/24 0131            Vitals/Pain Today's Vitals   01/12/24 0533 01/12/24 0600 01/12/24 0630 01/12/24 0700  BP:  (!) 157/71 (!) 154/77 (!) 140/83  Pulse:  66 72 74  Resp:    18  Temp: (!) 97.4 F (36.3 C)     TempSrc: Oral     SpO2:  100% 100% 100%  Weight:      PainSc:        Isolation Precautions No active isolations  Medications Medications  levothyroxine (SYNTHROID) tablet 100 mcg (100 mcg Oral Given 01/12/24 0536)  predniSONE (DELTASONE) tablet 5 mg (has no administration in time range)  azaTHIOprine (IMURAN) tablet 50 mg (has no administration in time range)  cycloSPORINE modified (NEORAL) capsule 75 mg (75 mg Oral Given 01/12/24 0257)  heparin injection 5,000 Units (5,000 Units Subcutaneous Given 01/12/24 0536)  sodium chloride flush (NS) 0.9 % injection 3 mL (3 mLs Intravenous Not Given 01/12/24 0142)  sodium chloride flush (NS) 0.9 % injection 3 mL (3 mLs Intravenous Not Given 01/12/24 0143)  sodium chloride flush (NS) 0.9 % injection 3 mL (has no administration in time range)  0.9 %  sodium chloride infusion (has no administration in time range)  acetaminophen (TYLENOL) tablet 650 mg (has no administration in time range)    Or  acetaminophen (TYLENOL) suppository 650 mg (has no administration in time range)  ondansetron (ZOFRAN) tablet 4 mg (has no administration in time range)    Or  ondansetron (ZOFRAN) injection 4 mg (has no administration in time range)  insulin pump ( Subcutaneous Given 01/12/24 0244)  ampicillin (OMNIPEN) 2 g in sodium chloride 0.9 % 100 mL IVPB (0 g Intravenous Stopped 01/12/24 0316)  sodium bicarbonate tablet 650 mg (650 mg Oral Given 01/12/24 0537)    Mobility non-ambulatory     Focused  Assessments Bacteremia   R Recommendations: See Admitting Provider Note  Report given to:   Additional Notes: .

## 2024-01-12 NOTE — ED Provider Notes (Signed)
[place] EMERGENCY DEPARTMENT AT [facility] Provider Note  CSN: [id number] Arrival date & time: 01/11/24 1645  Chief Complaint(s) abnormal labs  HPI [name] is a 63 y.o. female with a past medical history listed below including indwelling catheter who was discharged yesterday after hospitalization for catheter associated UTI.  She was discharged on Bactrim and called after blood cultures grew out Enterococcus.  She was instructed to return to the emergency department for admission to treat with IV antibiotics.  Patient reports feeling well and has no acute complaints.  HPI  Past Medical History 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   DDD (degenerative disc disease), lumbar    Diabetes mellitus without complication (HCC)    type 1   Diabetic ketoacidosis (HCC) 09/26/2020   Gait disorder    neurogenic   Gangrene of right foot (HCC)    Hypertension    Hypothyroid 12/08/2020   Neuropathy    Renal disease    Sacral decubitus ulcer, stage IV (HCC) 12/08/2020   Subacute osteomyelitis of right foot [facility])    Patient Active Problem List   Diagnosis Date Noted   Gram-positive bacteremia 01/12/2024   Peripheral neuropathy 01/12/2024   Sepsis secondary to UTI (HCC) 01/09/2024   Pressure ulcer 01/09/2024   Acute cystitis without hematuria    Acute pyelonephritis    AKI (acute kidney injury) (HCC) 12/18/2021   Chronic indwelling Foley catheter 12/17/2021   S/P BKA (below knee amputation) unilateral, right (HCC) 12/17/2021   DNR (do not resuscitate)/DNI(Do Not Intubate) 12/17/2021   Chronic osteomyelitis of sacrum (HCC) 08/25/2021   Anemia 02/25/2021   Goals of care, counseling/discussion    Palliative care by specialist    Malnutrition of moderate degree 01/07/2021   Failure to thrive in adult    Unspecified severe  protein-calorie malnutrition (HCC) 12/10/2020   Sacral decubitus ulcer, stage IV (HCC) 12/08/2020   Erosive esophagitis 12/08/2020   Hypothyroidism 12/08/2020   Depression 12/08/2020   Insulin dependent type 1 diabetes mellitus (HCC) 12/08/2020   Hypoalbuminemia 12/07/2020   Coffee ground emesis    Gastroesophageal reflux disease with esophagitis and hemorrhage    Long-term use of immunosuppressant medication    Type 1 diabetes mellitus with complication, with long term current use of insulin pump (HCC) 09/27/2020   Pressure ulcer of BKA stump (HCC) 09/27/2020   Anemia secondary to renal failure 05/14/2020   History of renal transplant 05/14/2020   CKD stage 3b, GFR 30-44 ml/min (HCC) 05/14/2020   Hypertension 05/14/2020   Home Medication(s) Prior to Admission medications   Medication Sig Start Date End Date Taking? Authorizing Provider  azaTHIOprine (IMURAN) 50 MG tablet Take 1 tablet (50 mg total) by mouth daily. 03/02/21  Yes Alwyn Ren, MD  cycloSPORINE modified (NEORAL) 25 MG capsule Take 75 mg by mouth in the morning and at bedtime. 12/13/21  Yes [provider]  lisinopril (ZESTRIL) 5 MG tablet Take 5 mg by mouth at bedtime. 12/19/19  Yes [provider]  NOVOLOG 100 UNIT/ML injection Inject 100 Units into the skin See admin instructions. Per Omnipod insulin pump   Yes [provider]  predniSONE (DELTASONE) 5 MG tablet Take 1 tablet (5 mg total) by mouth daily with breakfast. 03/02/21  Yes Alwyn Ren, MD  sulfamethoxazole-trimethoprim (BACTRIM DS) 800-160 MG tablet Take 1 tablet by mouth 2 (  two) times daily for 7 days. 01/10/24 01/17/24 Yes Tobey Grim, MD  levothyroxine (SYNTHROID) 100 MCG tablet Take 100 mcg by mouth daily before breakfast.    [provider]  sucralfate (CARAFATE) 1 GM/10ML suspension Take 10 mLs (1 g total) by mouth 4 (four) times daily -  with meals and at bedtime. 12/22/21 01/09/24  Rodolph Bong, MD                                                                                                                                     Allergies Patient has no known allergies.  Review of Systems Review of Systems As noted in HPI  Physical Exam Vital Signs  I have reviewed the triage vital signs BP (!) 145/79   Pulse 72   Temp 98.1 F (36.7 C)   Resp 18   Wt 59 kg   SpO2 100%   BMI 23.04 kg/m   Physical Exam Vitals reviewed.  Constitutional:      General: She is not in acute distress.    Appearance: She is well-developed. She is not diaphoretic.  HENT:     Head: Normocephalic and atraumatic.     Right Ear: External ear normal.     Left Ear: External ear normal.     Nose: Nose normal.  Eyes:     General: No scleral icterus.    Conjunctiva/sclera: Conjunctivae normal.  Neck:     Trachea: Phonation normal.  Cardiovascular:     Rate and Rhythm: Normal rate and regular rhythm.  Pulmonary:     Effort: Pulmonary effort is normal. No respiratory distress.     Breath sounds: No stridor.  Abdominal:     General: There is no distension.  Genitourinary:    Comments: Foley with cloudy yellow urine Musculoskeletal:        General: Normal range of motion.     Cervical back: Normal range of motion.     Right Lower Extremity: Right leg is amputated below knee.  Neurological:     Mental Status: She is alert and oriented to person, place, and time.  Psychiatric:        Behavior: Behavior normal.     ED Results and Treatments Labs (all labs ordered are listed, but only abnormal results are displayed) Labs Reviewed  CBC WITH DIFFERENTIAL/PLATELET - Abnormal; Notable for the following components:      Result Value   RBC 2.91 (*)    Hemoglobin 9.6 (*)    HCT 31.0 (*)    MCV 106.5 (*)    All other components within normal limits  COMPREHENSIVE METABOLIC PANEL - Abnormal; Notable for the following components:   Sodium 134 (*)    CO2 17 (*)    Glucose, Bld 342 (*)    BUN 52  (*)    Creatinine, Ser 1.66 (*)    Albumin 3.2 (*)    GFR, Estimated  34 (*)    All other components within normal limits  URINALYSIS, ROUTINE W REFLEX MICROSCOPIC - Abnormal; Notable for the following components:   Glucose, UA >=500 (*)    Hgb urine dipstick SMALL (*)    Protein, ur 30 (*)    Leukocytes,Ua LARGE (*)    All other components within normal limits  CBC - Abnormal; Notable for the following components:   RBC 2.84 (*)    Hemoglobin 9.3 (*)    HCT 30.4 (*)    MCV 107.0 (*)    All other components within normal limits  COMPREHENSIVE METABOLIC PANEL - Abnormal; Notable for the following components:   Sodium 132 (*)    CO2 17 (*)    Glucose, Bld 261 (*)    BUN 50 (*)    Creatinine, Ser 1.78 (*)    Calcium 8.8 (*)    Total Protein 6.2 (*)    Albumin 2.9 (*)    GFR, Estimated 31 (*)    All other components within normal limits  CULTURE, BLOOD (ROUTINE X 2)  CULTURE, BLOOD (ROUTINE X 2)                                                                                                                         EKG  EKG Interpretation Date/Time:    Ventricular Rate:    PR Interval:    QRS Duration:    QT Interval:    QTC Calculation:   R Axis:      Text Interpretation:         Radiology No results found.  Medications Ordered in ED Medications  levothyroxine (SYNTHROID) tablet 100 mcg (has no administration in time range)  predniSONE (DELTASONE) tablet 5 mg (has no administration in time range)  azaTHIOprine (IMURAN) tablet 50 mg (has no administration in time range)  cycloSPORINE modified (NEORAL) capsule 75 mg (75 mg Oral Given 01/12/24 0257)  heparin injection 5,000 Units (has no administration in time range)  sodium chloride flush (NS) 0.9 % injection 3 mL (3 mLs Intravenous Not Given 01/12/24 0142)  sodium chloride flush (NS) 0.9 % injection 3 mL (3 mLs Intravenous Not Given 01/12/24 0143)  sodium chloride flush (NS) 0.9 % injection 3 mL (has no administration  in time range)  0.9 %  sodium chloride infusion (has no administration in time range)  acetaminophen (TYLENOL) tablet 650 mg (has no administration in time range)    Or  acetaminophen (TYLENOL) suppository 650 mg (has no administration in time range)  ondansetron (ZOFRAN) tablet 4 mg (has no administration in time range)    Or  ondansetron (ZOFRAN) injection 4 mg (has no administration in time range)  insulin pump ( Subcutaneous Given 01/12/24 0244)  ampicillin (OMNIPEN) 2 g in sodium chloride 0.9 % 100 mL IVPB (0 g Intravenous Stopped 01/12/24 0316)  sodium bicarbonate tablet 650 mg (has no administration in time range)   Procedures Procedures  (including critical care time) Medical Decision Making /  ED Course   Medical Decision Making Amount and/or Complexity of Data Reviewed Labs: ordered. Decision-making details documented in ED Course.  Risk Decision regarding hospitalization.    Patient returns for bacteremia requiring admission for IV antibiotics.  Spoke with Dr. Janalyn Shy from the hospitalist service who agreed to admit patient.  Patient was started on IV Rocephin    Final Clinical Impression(s) / ED Diagnoses Final diagnoses:  Bacteremia    This chart was dictated using voice recognition software.  Despite best efforts to proofread,  errors can occur which can change the documentation meaning.    Nira Conn, MD 01/12/24 [phone]

## 2024-01-12 NOTE — Progress Notes (Signed)
PROGRESS NOTE  [name] WUJ:[id number] DOB: 1961-11-25 DOA: 01/11/2024 PCP: Stevphen Rochester, MD   LOS: 0 days   Brief Narrative / Interim history: 63 year old female with history of chronic indwelling Foley catheter, DM2, prior renal transplant with CKD 3A, hypothyroidism, right-sided BKA, chronic pressure ulcers, generalized deconditioning, peripheral neuropathy comes into the hospital after being called that her blood cultures were positive.  She was hospitalized 1/14-1/15 for sepsis due to UTI, catheter was changed and she was discharged with Bactrim.  Blood cultures came back positive for Enterococcus bacteremia and she was called to come back to the hospital.  No fever or chills, overall feeling good  Subjective / 24h Interval events: She is doing well this morning, denies any chest pain, denies any shortness of breath.  No abdominal pain, no nausea or vomiting.  Assesement and Plan: Principal Problem:   Gram-positive bacteremia Active Problems:   History of renal transplant   CKD stage 3b, GFR 30-44 ml/min (HCC)   Hypothyroidism   Insulin dependent type 1 diabetes mellitus (HCC)   Chronic indwelling Foley catheter   Pressure ulcer   Peripheral neuropathy  Principal problem Enterococcus faecalis bacteremia -on blood cultures obtained 1/15.  Blood cultures also showed staph auricularis in 1/4 bottles.  She was told to come back mainly for the Enterococcus findings.  ID consulted -Has been placed on ampicillin, continue, defer further antibiotics changes to ID  Active problems Proteus UTI-noticed on the urine culture obtained 1/13, pansensitive except for nitrofurantoin.  Continue ampicillin.  Urinary catheter was exchanged at that time  Chronic kidney disease stage IIIb -baseline creatinine ranging between 1.6-1.8, currently at baseline.  History of renal transplant-continue home medications  Hypothyroidism-continue Synthroid  Essential hypertension-hold home  lisinopril, monitor for now  Chronic sacral pressure ulcer-wound care consult  Chronic indwelling Foley catheter-follows with urology as an outpatient in [place].  Foley has been changed 1/13  Anemia of chronic renal disease-hemoglobin stable, no bleeding  DM type I-continue insulin pump  Lab Results  Component Value Date   HGBA1C 7.2 (H) 01/09/2024   CBG (last 3)  Recent Labs    01/10/24 0945 01/10/24 1118 01/10/24 1634  GLUCAP 83 79 140*    Scheduled Meds:  azaTHIOprine  50 mg Oral Daily   cycloSPORINE modified  75 mg Oral BID   heparin  5,000 Units Subcutaneous Q8H   insulin pump   Subcutaneous TID WC, HS, 0200   levothyroxine  100 mcg Oral Q0600   predniSONE  5 mg Oral Q breakfast   sodium bicarbonate  650 mg Oral TID   sodium chloride flush  3 mL Intravenous Q12H   sodium chloride flush  3 mL Intravenous Q12H   Continuous Infusions:  sodium chloride     ampicillin (OMNIPEN) IV Stopped (01/12/24 0316)   PRN Meds:.sodium chloride, acetaminophen **OR** acetaminophen, ondansetron **OR** ondansetron (ZOFRAN) IV, sodium chloride flush  Current Outpatient Medications  Medication Instructions   azaTHIOprine (IMURAN) 50 mg, Oral, Daily   cycloSPORINE modified (NEORAL) 75 mg, Oral, 2 times daily   levothyroxine (SYNTHROID) 100 mcg, Oral, Daily before breakfast   lisinopril (ZESTRIL) 5 mg, Oral, Daily at bedtime   NovoLOG 100 Units, Subcutaneous, See admin instructions, Per Omnipod insulin pump    predniSONE (DELTASONE) 5 mg, Oral, Daily with breakfast   sucralfate (CARAFATE) 1 g, Oral, 3 times daily with meals & bedtime   sulfamethoxazole-trimethoprim (BACTRIM DS) 800-160 MG tablet 1 tablet, Oral, 2 times daily    Diet Orders (From admission,  onward)     Start     Ordered   01/12/24 0125  Diet heart healthy/carb modified Room service appropriate? Yes; Fluid consistency: Thin  Diet effective now       Question Answer Comment  Diet-HS Snack? Nothing   Room  service appropriate? Yes   Fluid consistency: Thin      01/12/24 0124            DVT prophylaxis: heparin injection 5,000 Units Start: 01/12/24 0600 SCDs Start: 01/12/24 0125 Place TED hose Start: 01/12/24 0125   Lab Results  Component Value Date   PLT 192 01/12/2024      Code Status: Limited: Do not attempt resuscitation (DNR) -DNR-LIMITED -Do Not Intubate/DNI   Family Communication: No family at bedside  Status is: Inpatient Remains inpatient appropriate because: Severity of illness   Level of care: Med-Surg  Consultants:  ID  Objective: Vitals:   01/12/24 0533 01/12/24 0600 01/12/24 0630 01/12/24 0700  BP:  (!) 157/71 (!) 154/77 (!) 140/83  Pulse:  66 72 74  Resp:    18  Temp: (!) 97.4 F (36.3 C)     TempSrc: Oral     SpO2:  100% 100% 100%  Weight:        Intake/Output Summary (Last 24 hours) at 01/12/2024 0850 Last data filed at 01/12/2024 0316 Gross per 24 hour  Intake 100 ml  Output --  Net 100 ml   Wt Readings from Last 3 Encounters:  01/11/24 59 kg  01/09/24 59 kg  12/12/23 59 kg    Examination:  Constitutional: NAD Eyes: no scleral icterus ENMT: Mucous membranes are moist.  Neck: normal, supple Respiratory: clear to auscultation bilaterally, no wheezing, no crackles. Normal respiratory effort.  Cardiovascular: Regular rate and rhythm, no murmurs / rubs / gallops. No LE edema.  Abdomen: non distended, no tenderness. Bowel sounds positive.  Musculoskeletal: no clubbing / cyanosis.    Data Reviewed: I have independently reviewed following labs and imaging studies   CBC Recent Labs  Lab 01/09/24 1553 01/10/24 0520 01/11/24 1756 01/12/24 0204  WBC 9.9 9.7 7.0 6.6  HGB 10.5* 8.5* 9.6* 9.3*  HCT 34.2* 27.6* 31.0* 30.4*  PLT 193 155 171 192  MCV 107.5* 107.8* 106.5* 107.0*  MCH 33.0 33.2 33.0 32.7  MCHC 30.7 30.8 31.0 30.6  RDW 14.7 14.8 14.5 14.6  LYMPHSABS 1.5  --  0.8  --   MONOABS 0.8  --  0.3  --   EOSABS 0.2  --  0.0  --    BASOSABS 0.0  --  0.0  --     Recent Labs  Lab 01/09/24 1549 01/09/24 1553 01/09/24 1553 01/09/24 1755 01/09/24 1800 01/09/24 2258 01/10/24 0520 01/10/24 1409 01/11/24 1756 01/12/24 0204  NA  --  135  --   --   --   --  138 137 134* 132*  K  --  5.4*  --   --   --   --  4.7 5.0 4.8 4.5  CL  --  111  --   --   --   --  114* 111 108 106  CO2  --  19*  --   --   --   --  19* 20* 17* 17*  GLUCOSE  --  94  --   --   --   --  166* 72 342* 261*  BUN  --  61*  --   --   --   --  52* 53* 52* 50*  CREATININE  --  1.73*   < >  --  1.74*  --  1.80* 1.76* 1.66* 1.78*  CALCIUM  --  9.0  --   --   --   --  8.6* 8.7* 9.2 8.8*  AST  --  30  --   --   --   --   --   --  17 15  ALT  --  16  --   --   --   --   --   --  14 12  ALKPHOS  --  78  --   --   --   --   --   --  71 69  BILITOT  --  0.8  --   --   --   --   --   --  0.6 0.3  ALBUMIN  --  3.4*  --   --   --   --   --   --  3.2* 2.9*  LATICACIDVEN 0.9  --   --  1.5  --   --   --   --   --   --   INR  --  1.0  --   --   --   --  1.2  --   --   --   HGBA1C  --   --   --   --   --  7.2*  --   --   --   --    < > = values in this interval not displayed.    ------------------------------------------------------------------------------------------------------------------ No results for input(s): "CHOL", "HDL", "LDLCALC", "TRIG", "CHOLHDL", "LDLDIRECT" in the last 72 hours.  Lab Results  Component Value Date   HGBA1C 7.2 (H) 01/09/2024   ------------------------------------------------------------------------------------------------------------------ No results for input(s): "TSH", "T4TOTAL", "T3FREE", "THYROIDAB" in the last 72 hours.  Invalid input(s): "FREET3"  Cardiac Enzymes No results for input(s): "CKMB", "TROPONINI", "MYOGLOBIN" in the last 168 hours.  Invalid input(s): "CK" ------------------------------------------------------------------------------------------------------------------ No results found for:  "BNP"  CBG: Recent Labs  Lab 01/09/24 2205 01/10/24 0151 01/10/24 0945 01/10/24 1118 01/10/24 1634  GLUCAP 96 240* 83 79 140*    Recent Results (from the past 240 hours)  Culture, blood (Routine x 2)     Status: None (Preliminary result)   Collection Time: 01/09/24  3:45 PM   Specimen: BLOOD  Result Value Ref Range Status   Specimen Description   Final    BLOOD SITE NOT SPECIFIED Performed at [facility], [address] [place], [place] [address]    Special Requests   Final    BOTTLES DRAWN AEROBIC AND ANAEROBIC Blood Culture results may not be optimal due to an inadequate volume of blood received in culture bottles Performed at [facility], [address] [place], [place] [address]    Culture   Final    NO GROWTH 3 DAYS Performed at [facility], [address] [place], [place] [address]    Report Status PENDING  Incomplete  Urine Culture     Status: Abnormal   Collection Time: 01/09/24  3:47 PM   Specimen: Urine, Random  Result Value Ref Range Status   Specimen Description   Final    URINE, RANDOM Performed at [facility], [address] [place], [place] [address]    Special Requests   Final    NONE Reflexed from [id number] Performed at [facility],  [address], [place], [place] [address]    Culture >=100,000 COLONIES/mL PROTEUS MIRABILIS (A)  Final   Report Status 01/11/2024 FINAL  Final   Organism ID, Bacteria PROTEUS MIRABILIS (A)  Final      Susceptibility   Proteus mirabilis - MIC*    AMPICILLIN <=2 SENSITIVE Sensitive     CEFAZOLIN <=4 SENSITIVE Sensitive     CEFEPIME <=0.12 SENSITIVE Sensitive     CEFTRIAXONE <=0.25 SENSITIVE Sensitive     CIPROFLOXACIN <=0.25 SENSITIVE Sensitive     GENTAMICIN <=1 SENSITIVE Sensitive     IMIPENEM 2 SENSITIVE Sensitive     NITROFURANTOIN 128 RESISTANT Resistant     TRIMETH/SULFA <=20 SENSITIVE Sensitive     AMPICILLIN/SULBACTAM  <=2 SENSITIVE Sensitive     PIP/TAZO <=4 SENSITIVE Sensitive ug/mL    * >=100,000 COLONIES/mL PROTEUS MIRABILIS  Resp panel by RT-PCR (RSV, Flu A&B, Covid) Anterior Nasal Swab     Status: None   Collection Time: 01/09/24  4:45 PM   Specimen: Anterior Nasal Swab  Result Value Ref Range Status   SARS Coronavirus 2 by RT PCR NEGATIVE NEGATIVE Final    Comment: (NOTE) SARS-CoV-2 target nucleic acids are NOT DETECTED.  The SARS-CoV-2 RNA is generally detectable in upper respiratory specimens during the acute phase of infection. The lowest concentration of SARS-CoV-2 viral copies this assay can detect is 138 copies/mL. A negative result does not preclude SARS-Cov-2 infection and should not be used as the sole basis for treatment or other patient management decisions. A negative result may occur with  improper specimen collection/handling, submission of specimen other than nasopharyngeal swab, presence of viral mutation(s) within the areas targeted by this assay, and inadequate number of viral copies(<138 copies/mL). A negative result must be combined with clinical observations, patient history, and epidemiological information. The expected result is Negative.  Fact Sheet for Patients:  BloggerCourse.com  Fact Sheet for Healthcare Providers:  SeriousBroker.it  This test is no t yet approved or cleared by the [place] FDA and  has been authorized for detection and/or diagnosis of SARS-CoV-2 by FDA under an Emergency Use Authorization (EUA). This EUA will remain  in effect (meaning this test can be used) for the duration of the COVID-19 declaration under Section 564(b)(1) of the Act, 21 U.S.C.section 360bbb-3(b)(1), unless the authorization is terminated  or revoked sooner.       Influenza A by PCR NEGATIVE NEGATIVE Final   Influenza B by PCR NEGATIVE NEGATIVE Final    Comment: (NOTE) The Xpert Xpress SARS-CoV-2/FLU/RSV plus  assay is intended as an aid in the diagnosis of influenza from Nasopharyngeal swab specimens and should not be used as a sole basis for treatment. Nasal washings and aspirates are unacceptable for Xpert Xpress SARS-CoV-2/FLU/RSV testing.  Fact Sheet for Patients: BloggerCourse.com  Fact Sheet for Healthcare Providers: SeriousBroker.it  This test is not yet approved or cleared by the [place] FDA and has been authorized for detection and/or diagnosis of SARS-CoV-2 by FDA under an Emergency Use Authorization (EUA). This EUA will remain in effect (meaning this test can be used) for the duration of the COVID-19 declaration under Section 564(b)(1) of the Act, 21 U.S.C. section 360bbb-3(b)(1), unless the authorization is terminated or revoked.     Resp Syncytial Virus by PCR NEGATIVE NEGATIVE Final    Comment: (NOTE) Fact Sheet for Patients: BloggerCourse.com  Fact Sheet for Healthcare Providers: SeriousBroker.it  This test is not yet approved or cleared by the [place] and has been authorized  for detection and/or diagnosis of SARS-CoV-2 by FDA under an Emergency Use Authorization (EUA). This EUA will remain in effect (meaning this test can be used) for the duration of the COVID-19 declaration under Section 564(b)(1) of the Act, 21 U.S.C. section 360bbb-3(b)(1), unless the authorization is terminated or revoked.  Performed at [facility], [address] [place], [place] [address]   Culture, blood (Routine x 2)     Status: Abnormal (Preliminary result)   Collection Time: 01/09/24  4:56 PM   Specimen: BLOOD LEFT ARM  Result Value Ref Range Status   Specimen Description   Final    BLOOD LEFT ARM Performed at [facility], [address], [name], [place] [address]    Special Requests   Final    BOTTLES DRAWN AEROBIC AND  ANAEROBIC Blood Culture results may not be optimal due to an inadequate volume of blood received in culture bottles Performed at [facility], [address], [place], [place] [address]    Culture  Setup Time   Final    GRAM POSITIVE COCCI IN CLUSTERS BOTTLES DRAWN AEROBIC ONLY CRITICAL RESULT CALLED TO, READ BACK BY AND VERIFIED WITH: J DANIELS MD 01/11/2024 @ 0009 BY AB GRAM POSITIVE COCCI IN CHAINS ANAEROBIC BOTTLE ONLY CRITICAL RESULT CALLED TO, READ BACK BY AND VERIFIED WITH: DR Gwendolyn Grant [id number] AT 801 BY CM    Culture (A)  Final    STAPHYLOCOCCUS AURICULARIS THE SIGNIFICANCE OF ISOLATING THIS ORGANISM FROM A SINGLE SET OF BLOOD CULTURES WHEN MULTIPLE SETS ARE DRAWN IS UNCERTAIN. PLEASE NOTIFY THE MICROBIOLOGY DEPARTMENT WITHIN ONE WEEK IF SPECIATION AND SENSITIVITIES ARE REQUIRED. ENTEROCOCCUS FAECALIS SUSCEPTIBILITIES TO FOLLOW Performed at [facility], [address] [place], [place] [address]    Report Status PENDING  Incomplete  Blood Culture ID Panel (Reflexed)     Status: Abnormal   Collection Time: 01/09/24  4:56 PM  Result Value Ref Range Status   Enterococcus faecalis NOT DETECTED NOT DETECTED Final   Enterococcus Faecium NOT DETECTED NOT DETECTED Final   Listeria monocytogenes NOT DETECTED NOT DETECTED Final   Staphylococcus species DETECTED (A) NOT DETECTED Final    Comment: CRITICAL RESULT CALLED TO, READ BACK BY AND VERIFIED WITH: J DANIELS MD 01/11/2024 @ 0009 BY AB    Staphylococcus aureus (BCID) NOT DETECTED NOT DETECTED Final   Staphylococcus epidermidis NOT DETECTED NOT DETECTED Final   Staphylococcus lugdunensis NOT DETECTED NOT DETECTED Final   Streptococcus species NOT DETECTED NOT DETECTED Final   Streptococcus agalactiae NOT DETECTED NOT DETECTED Final   Streptococcus pneumoniae NOT DETECTED NOT DETECTED Final   Streptococcus pyogenes NOT DETECTED NOT DETECTED Final   A.calcoaceticus-baumannii NOT DETECTED NOT DETECTED Final    Bacteroides fragilis NOT DETECTED NOT DETECTED Final   Enterobacterales NOT DETECTED NOT DETECTED Final   Enterobacter cloacae complex NOT DETECTED NOT DETECTED Final   Escherichia coli NOT DETECTED NOT DETECTED Final   Klebsiella aerogenes NOT DETECTED NOT DETECTED Final   Klebsiella oxytoca NOT DETECTED NOT DETECTED Final   Klebsiella pneumoniae NOT DETECTED NOT DETECTED Final   Proteus species NOT DETECTED NOT DETECTED Final   Salmonella species NOT DETECTED NOT DETECTED Final   Serratia marcescens NOT DETECTED NOT DETECTED Final   Haemophilus influenzae NOT DETECTED NOT DETECTED Final   Neisseria meningitidis NOT DETECTED NOT DETECTED Final   Pseudomonas aeruginosa NOT DETECTED NOT DETECTED Final   Stenotrophomonas maltophilia NOT DETECTED NOT DETECTED Final   Candida albicans NOT DETECTED  NOT DETECTED Final   Candida auris NOT DETECTED NOT DETECTED Final   Candida glabrata NOT DETECTED NOT DETECTED Final   Candida krusei NOT DETECTED NOT DETECTED Final   Candida parapsilosis NOT DETECTED NOT DETECTED Final   Candida tropicalis NOT DETECTED NOT DETECTED Final   Cryptococcus neoformans/gattii NOT DETECTED NOT DETECTED Final    Comment: Performed at [facility], [address] [place], [place] [address]  MRSA Next Gen by PCR, Nasal     Status: None   Collection Time: 01/10/24  1:07 PM   Specimen: Nasal Mucosa; Nasal Swab  Result Value Ref Range Status   MRSA by PCR Next Gen NOT DETECTED NOT DETECTED Final    Comment: (NOTE) The GeneXpert MRSA Assay (FDA approved for NASAL specimens only), is one component of a comprehensive MRSA colonization surveillance program. It is not intended to diagnose MRSA infection nor to guide or monitor treatment for MRSA infections. Test performance is not FDA approved in patients less than 25 years old. Performed at [facility], [address] [place], [place] [address]   Blood Culture ID Panel (Reflexed)     Status:  Abnormal   Collection Time: 01/11/24  4:46 AM  Result Value Ref Range Status   Enterococcus faecalis DETECTED (A) NOT DETECTED Final    Comment: CRITICAL RESULT CALLED TO, READ BACK BY AND VERIFIED WITH: DR Gwendolyn Grant [id number] AT 0801 BY CM    Enterococcus Faecium NOT DETECTED NOT DETECTED Final   Listeria monocytogenes NOT DETECTED NOT DETECTED Final   Staphylococcus species NOT DETECTED NOT DETECTED Final   Staphylococcus aureus (BCID) NOT DETECTED NOT DETECTED Final   Staphylococcus epidermidis NOT DETECTED NOT DETECTED Final   Staphylococcus lugdunensis NOT DETECTED NOT DETECTED Final   Streptococcus species NOT DETECTED NOT DETECTED Final   Streptococcus agalactiae NOT DETECTED NOT DETECTED Final   Streptococcus pneumoniae NOT DETECTED NOT DETECTED Final   Streptococcus pyogenes NOT DETECTED NOT DETECTED Final   A.calcoaceticus-baumannii NOT DETECTED NOT DETECTED Final   Bacteroides fragilis NOT DETECTED NOT DETECTED Final   Enterobacterales NOT DETECTED NOT DETECTED Final   Enterobacter cloacae complex NOT DETECTED NOT DETECTED Final   Escherichia coli NOT DETECTED NOT DETECTED Final   Klebsiella aerogenes NOT DETECTED NOT DETECTED Final   Klebsiella oxytoca NOT DETECTED NOT DETECTED Final   Klebsiella pneumoniae NOT DETECTED NOT DETECTED Final   Proteus species NOT DETECTED NOT DETECTED Final   Salmonella species NOT DETECTED NOT DETECTED Final   Serratia marcescens NOT DETECTED NOT DETECTED Final   Haemophilus influenzae NOT DETECTED NOT DETECTED Final   Neisseria meningitidis NOT DETECTED NOT DETECTED Final   Pseudomonas aeruginosa NOT DETECTED NOT DETECTED Final   Stenotrophomonas maltophilia NOT DETECTED NOT DETECTED Final   Candida albicans NOT DETECTED NOT DETECTED Final   Candida auris NOT DETECTED NOT DETECTED Final   Candida glabrata NOT DETECTED NOT DETECTED Final   Candida krusei NOT DETECTED NOT DETECTED Final   Candida parapsilosis NOT DETECTED NOT DETECTED Final    Candida tropicalis NOT DETECTED NOT DETECTED Final   Cryptococcus neoformans/gattii NOT DETECTED NOT DETECTED Final   Vancomycin resistance NOT DETECTED NOT DETECTED Final    Comment: Performed at [facility], [address], [place], [place] [address]  Culture, blood (Routine X 2) w Reflex to ID Panel     Status: None (Preliminary result)   Collection Time: 01/12/24  1:57 AM   Specimen: BLOOD  Result Value Ref Range Status  Specimen Description   Final    BLOOD LEFT ANTECUBITAL Performed at [facility], [address], [place], [place] [address]    Special Requests   Final    BOTTLES DRAWN AEROBIC AND ANAEROBIC Blood Culture results may not be optimal due to an inadequate volume of blood received in culture bottles Performed at [facility], [address] [place], [place] [address]    Culture   Final    NO GROWTH <12 HOURS Performed at [facility], [address] [place], [place] [address]    Report Status PENDING  Incomplete  Culture, blood (Routine X 2) w Reflex to ID Panel     Status: None (Preliminary result)   Collection Time: 01/12/24  2:04 AM   Specimen: BLOOD  Result Value Ref Range Status   Specimen Description   Final    BLOOD BLOOD LEFT FOREARM Performed at [facility], [address], [place], [place] [address]    Special Requests   Final    BOTTLES DRAWN AEROBIC AND ANAEROBIC Blood Culture results may not be optimal due to an inadequate volume of blood received in culture bottles Performed at [facility], [address] [place], [place] [address]    Culture   Final    NO GROWTH <12 HOURS Performed at [facility], [address], [place], [place] [address]    Report Status PENDING  Incomplete     Radiology Studies: No results found.   Pamella Pert, MD, PhD [facility]  Between 7 am - 7 pm I am available, please contact me via Amion (for  emergencies) or Securechat (non urgent messages)  Between 7 pm - 7 am I am not available, please contact night coverage MD/APP via Amion

## 2024-01-12 NOTE — H&P (Addendum)
History and Physical    [name] ION:[id number] DOB: 1961-03-17 DOA: 01/11/2024  PCP: Stevphen Rochester, MD   Patient coming from: Home   Chief Complaint:  Chief Complaint  Patient presents with   abnormal labs   ED TRIAGE note:  Pt referred to ED for positive blood cultures and needing IV abx.  Patient denies fever.  Patient reports started on abx and started having diarrhea this am    HPI:  [name] is a 63 y.o. female with medical history significant of chronic indwelling Foley catheter, DM type II, gout, history of renal transplant, hypothyroidism, CKD stage IIIa, right sided amputation below knee, chronic pressure ulcer, generalized deconditioning, sacral pressure ulcer, and peripheral neuropathy. Patient was hospitalized 1/14 to 1/15 for sepsis secondary to UTI.  She has been treated with ceftriaxone and on discharge has been prescribed Bactrim to complete 7 days course.  However given blood culture came back positive with bacteremia Dr. Gwendolyn Grant informed and called the patient to come back to hospital for IV antibiotic treatment.   Patient was discharged from the hospital 01/10/2024 after managing with sepsis secondary to complicated UTI.  Per chart review patient's urine culture was positive with Proteus mirabilis  which is pansensitive sensitive.  Patient was treated with Rocephin and discharged with Bactrim double strength to complete 7 days course.  Blood culture from 1/13 grew Staphylococcus species and 1/15 growing Enterococcus faecalis.  To my evaluation at the bedside patient denies any fever and chill.  Denies any chest pain, shortness of breath, palpitation, headache, nausea, abdominal pain, constipation, diarrhea, change of weight and appetite.  No other complaint at this time.   ED Course:  At presentation to ED patient is hemodynamically stable. UA from 01/10/2023 showed dipstick hemoglobin positive, protein 30 leukocyte esterase large. CMP showed  low bicarb 17, creatinine 1.6, albumin 3.2 GFR 34. CBC unremarkable stable H&H 9.6 and 31. Blood culture 1 set growing Enterococcus faecalis and other set is growing staphy species.  Patient has been referred to emergency department for management of Enterococcus faecalis positive bacteremia and Proteus associated UTI.  Hospitalist has been contacted for evaluation management of same.   Significant labs in the ED: Lab Orders         Culture, blood (Routine X 2) w Reflex to ID Panel         CBC with Differential         Comprehensive metabolic panel         Urinalysis, Routine w reflex microscopic -Urine, Clean Catch         CBC         Comprehensive metabolic panel         Comprehensive metabolic panel         CBC       Review of Systems:  Review of Systems  Constitutional:  Negative for chills, fever, malaise/fatigue and weight loss.  Respiratory:  Negative for cough and shortness of breath.   Cardiovascular:  Negative for chest pain and palpitations.  Gastrointestinal:  Negative for abdominal pain, heartburn, nausea and vomiting.  Genitourinary:  Negative for dysuria, flank pain, frequency and urgency.  Musculoskeletal:  Negative for back pain and myalgias.  Skin:  Negative for rash.  Neurological:  Negative for dizziness and headaches.  Psychiatric/Behavioral:  The patient is not nervous/anxious.     Past Medical History:  Diagnosis Date   Anemia of chronic renal failure    Cellulitis and abscess of right leg 09/26/2020  Chronic osteomyelitis of sacrum (HCC) 08/25/2021   DDD (degenerative disc disease), lumbar    Diabetes mellitus without complication (HCC)    type 1   Diabetic ketoacidosis (HCC) 09/26/2020   Gait disorder    neurogenic   Gangrene of right foot (HCC)    Hypertension    Hypothyroid 12/08/2020   Neuropathy    Renal disease    Sacral decubitus ulcer, stage IV (HCC) 12/08/2020   Subacute osteomyelitis of right foot [facility])     Past Surgical History:   Procedure Laterality Date   AMPUTATION Right 10/01/2020   Procedure: RIGHT BELOW KNEE AMPUTATION;  Surgeon: Nadara Mustard, MD;  Location: [facility] OR;  Service: Orthopedics;  Laterality: Right;   BLADDER SURGERY     ESOPHAGOGASTRODUODENOSCOPY (EGD) WITH PROPOFOL N/A 11/10/2020   Procedure: ESOPHAGOGASTRODUODENOSCOPY (EGD) WITH PROPOFOL;  Surgeon: Hilarie Fredrickson, MD;  Location: [facility] ENDOSCOPY;  Service: Endoscopy;  Laterality: N/A;   IR GASTROSTOMY TUBE REMOVAL  03/25/2021   KIDNEY TRANSPLANT     LEG SURGERY     tib-fib fx , ORIF     reports that she has never smoked. She has never used smokeless tobacco. She reports that she does not drink alcohol and does not use drugs.  No Known Allergies  Family History  Problem Relation Age of Onset   Hypertension Mother    Hypertension Father     Prior to Admission medications   Medication Sig Start Date End Date Taking? Authorizing Provider  azaTHIOprine (IMURAN) 50 MG tablet Take 1 tablet (50 mg total) by mouth daily. 03/02/21   Alwyn Ren, MD  cycloSPORINE modified (NEORAL) 25 MG capsule Take 75 mg by mouth in the morning and at bedtime. 12/13/21   [provider]  levothyroxine (SYNTHROID) 100 MCG tablet Take 100 mcg by mouth daily before breakfast.    [provider]  lisinopril (ZESTRIL) 5 MG tablet Take 5 mg by mouth at bedtime. 12/19/19   [provider]  NOVOLOG 100 UNIT/ML injection Inject 100 Units into the skin See admin instructions. Per Omnipod insulin pump    [provider]  predniSONE (DELTASONE) 5 MG tablet Take 1 tablet (5 mg total) by mouth daily with breakfast. 03/02/21   Alwyn Ren, MD  sucralfate (CARAFATE) 1 GM/10ML suspension Take 10 mLs (1 g total) by mouth 4 (four) times daily -  with meals and at bedtime. 12/22/21 01/09/24  Rodolph Bong, MD  sulfamethoxazole-trimethoprim (BACTRIM DS) 800-160 MG tablet Take 1 tablet by mouth 2 (two) times daily for 7 days. 01/10/24 01/17/24   Tobey Grim, MD     Physical Exam: Vitals:   01/12/24 0104 01/12/24 0105 01/12/24 0130 01/12/24 0200  BP:  129/75 129/83 135/68  Pulse: 80 78 83 74  Resp:  16  18  Temp:      TempSrc:      SpO2: 100% 100% 98% 100%  Weight:        Physical Exam Constitutional:      Appearance: She is normal weight. She is ill-appearing.  HENT:     Mouth/Throat:     Mouth: Mucous membranes are moist.  Eyes:     Conjunctiva/sclera: Conjunctivae normal.  Cardiovascular:     Rate and Rhythm: Normal rate and regular rhythm.     Pulses: Normal pulses.     Heart sounds: Normal heart sounds.  Pulmonary:     Effort: Pulmonary effort is normal.     Breath sounds: Normal breath sounds.  Abdominal:     General: Bowel sounds are normal.  Musculoskeletal:        General: No swelling.     Cervical back: Normal range of motion and neck supple.  Skin:    Capillary Refill: Capillary refill takes less than 2 seconds.  Neurological:     Mental Status: She is oriented to person, place, and time.  Psychiatric:        Mood and Affect: Mood normal.        Thought Content: Thought content normal.      Labs on Admission: I have personally reviewed following labs and imaging studies  CBC: Recent Labs  Lab 01/09/24 1553 01/10/24 0520 01/11/24 1756 01/12/24 0204  WBC 9.9 9.7 7.0 6.6  NEUTROABS 7.3  --  5.7  --   HGB 10.5* 8.5* 9.6* 9.3*  HCT 34.2* 27.6* 31.0* 30.4*  MCV 107.5* 107.8* 106.5* 107.0*  PLT 193 155 171 192   Basic Metabolic Panel: Recent Labs  Lab 01/09/24 1553 01/09/24 1800 01/10/24 0520 01/10/24 1409 01/11/24 1756 01/12/24 0204  NA 135  --  138 137 134* 132*  K 5.4*  --  4.7 5.0 4.8 4.5  CL 111  --  114* 111 108 106  CO2 19*  --  19* 20* 17* 17*  GLUCOSE 94  --  166* 72 342* 261*  BUN 61*  --  52* 53* 52* 50*  CREATININE 1.73* 1.74* 1.80* 1.76* 1.66* 1.78*  CALCIUM 9.0  --  8.6* 8.7* 9.2 8.8*   GFR: Estimated Creatinine Clearance: 26.4 mL/min (A) (by C-G formula  based on SCr of 1.78 mg/dL (H)). Liver Function Tests: Recent Labs  Lab 01/09/24 1553 01/11/24 1756 01/12/24 0204  AST 30 17 15   ALT 16 14 12   ALKPHOS 78 71 69  BILITOT 0.8 0.6 0.3  PROT 7.1 7.0 6.2*  ALBUMIN 3.4* 3.2* 2.9*   No results for input(s): "LIPASE", "AMYLASE" in the last 168 hours. No results for input(s): "AMMONIA" in the last 168 hours. Coagulation Profile: Recent Labs  Lab 01/09/24 1553 01/10/24 0520  INR 1.0 1.2   Cardiac Enzymes: Recent Labs  Lab 01/09/24 1800  TROPONINIHS 6   BNP (last 3 results) No results for input(s): "BNP" in the last 8760 hours. HbA1C: Recent Labs    01/09/24 2258  HGBA1C 7.2*   CBG: Recent Labs  Lab 01/09/24 2205 01/10/24 0151 01/10/24 0945 01/10/24 1118 01/10/24 1634  GLUCAP 96 240* 83 79 140*   Lipid Profile: No results for input(s): "CHOL", "HDL", "LDLCALC", "TRIG", "CHOLHDL", "LDLDIRECT" in the last 72 hours. Thyroid Function Tests: No results for input(s): "TSH", "T4TOTAL", "FREET4", "T3FREE", "THYROIDAB" in the last 72 hours. Anemia Panel: No results for input(s): "VITAMINB12", "FOLATE", "FERRITIN", "TIBC", "IRON", "RETICCTPCT" in the last 72 hours. Urine analysis:    Component Value Date/Time   COLORURINE YELLOW 01/11/2024 2041   APPEARANCEUR CLEAR 01/11/2024 2041   LABSPEC 1.010 01/11/2024 2041   PHURINE 6.0 01/11/2024 2041   GLUCOSEU >=500 (A) 01/11/2024 2041   HGBUR SMALL (A) 01/11/2024 2041   BILIRUBINUR NEGATIVE 01/11/2024 2041   KETONESUR NEGATIVE 01/11/2024 2041   PROTEINUR 30 (A) 01/11/2024 2041   NITRITE NEGATIVE 01/11/2024 2041   LEUKOCYTESUR LARGE (A) 01/11/2024 2041    Radiological Exams on Admission: I have personally reviewed images No results found.   EKG: Pending EKG.    Assessment/Plan: Principal Problem:   Gram-positive bacteremia Active Problems:   History of renal transplant   CKD stage 3b, GFR  30-44 ml/min (HCC)   Hypothyroidism   Insulin dependent type 1 diabetes  mellitus (HCC)   Chronic indwelling Foley catheter   Pressure ulcer   Peripheral neuropathy    Assessment and Plan: Gram-positive bacteremia Enterococcus faecalis associated bacteremia Proteus mirabilis associated UTI. -Patient has recent hospital admission sepsis secondary to UTI.  Urine culture growing Proteus mirabilis sensitive to ampicillin.  Blood cultures 1 set growing Staphylococcus species and others set of blood culture is growing Enterococcus.  Discussed with pharmacy.  The set of blood culture is growing Staphylococcus likely contaminated however the blood culture is growing Enterococcus faecalis needs to be treated.  Per pharmacy ampicillin would be appropriate treating for Enterococcus faecalis and Proteus Mirabilis associated UTI as well. - Blood culture was from 01/09/2024.  Obtaining blood cultures again. -Hemodynamically stable. -Lab work from 1/15 CBC showing stable H&H 9.6 and 31.  No evidence of leukocytosis.  CMP showing low bicarb 17, creatinine 1.66, BUN 52, normal anion gap 9.  Elevated blood glucose 342. -Continue ampicillin 2 g every 8 hours. - Will follow-up with previous blood culture antibiotic sensitivity and repeat blood cultures as well. -Continue monitor WBC count and fever curve.  Non-anion gap metabolic acidosis-secondary to CKD stage IIIb - Low bicarb 17.  Obtaining CMP.  If it is showing persistent metabolic acidosis  will start oral bicarb tablet. Addendum: - CMP showing bicarb 17.  Starting oral bicarb tabs 650 mg 3 times daily.  History of renal transplant CKD stage IIIb of the transplant kidney CMP from 1/15 showing creatinine 1.6, BUN 52 and GFR 34.  Renal function at the baseline. - Continue cyclosporine, azathioprine and prednisone. -Continue to monitor renal function, avoid nephrotoxic agent and renally adjust medications.   Essential hypertension -Creatinine 1.7 and GFR 31.  Renal function yet has not came back to baseline yet.  Holding  lisinopril for now. - If blood pressure continues to remain in high range and renal function continues to remain poor need to change ACE inhibitor to amlodipine.  Currently blood pressure is normotensive holding any blood pressure regimen.  Chronic sacral pressure ulcer - Patient has chronic sacral pressure ulcer.  Consulting inpatient wound care.  Chronic indwelling Foley catheter - Foley has been changed in the ED 01/08/2023.  Patient needs outpatient follow-up with urology on discharge.  History of DM type I - Continue insulin pump.  Continue monitor blood glucose.  Macrocytic anemia - Stable H&H 9.6 and 31.  Continue to monitor   GERD - Continue Protonix.   DVT prophylaxis:  SQ Heparin Code Status:  DNR/DNI(Do NOT Intubate) Diet: Heart healthy carb modified diet Family Communication:   Family was present at bedside, at the time of interview. Opportunity was given to ask question and all questions were answered satisfactorily.  Disposition Plan: Pending previous blood culture antibiotic sensitivity and repeat blood culture results. Consults: None at this time Admission status:   Inpatient, Med-Surg  Severity of Illness: The appropriate patient status for this patient is INPATIENT. Inpatient status is judged to be reasonable and necessary in order to provide the required intensity of service to ensure the patient's safety. The patient's presenting symptoms, physical exam findings, and initial radiographic and laboratory data in the context of their chronic comorbidities is felt to place them at high risk for further clinical deterioration. Furthermore, it is not anticipated that the patient will be medically stable for discharge from the hospital within 2 midnights of admission.   * I certify that at the point  of admission it is my clinical judgment that the patient will require inpatient hospital care spanning beyond 2 midnights from the point of admission due to high intensity of  service, high risk for further deterioration and high frequency of surveillance required.Marland Kitchen    Tereasa Coop, MD [facility]  How to contact the [facility] Attending or Consulting provider 7A - 7P or covering provider during after hours 7P -7A, for this patient.  Check the care team in [facility] and look for a) attending/consulting [facility] provider listed and b) the [facility] team listed Log into [email or web address] and use [facility]'s universal password to access. If you do not have the password, please contact the hospital operator. Locate the [facility] provider you are looking for under [facility] and page to a number that you can be directly reached. If you still have difficulty reaching the provider, please page the [facility] (Director on Call) for the Hospitalists listed on amion for assistance.  01/12/2024, 3:26 AM      Rechecking CMP again.  If it shows persistent

## 2024-01-12 NOTE — Consult Note (Signed)
WOC Nurse Consult Note: Reason for Consult: Chronic stage 3 pressure injury to sacrum, history osteomyelitis.  Stage 3 pressure injury to mid back.  Seen at wound care center Wound type:stage 3 pressure injuries Pressure Injury POA: Yes Measurement:Sacrum:  2 cm  2 cm x 0.3 cm  Mid back  1 cm x 1.2 cm x 0.2 cm  Wound UJW:JXBJ pink nongranulating  have been cleaning with Dakins Drainage (amount, consistency, odor) minimal serosanguinous  Periwound: Some irritation at times, otherwise intact Dressing procedure/placement/frequency: cleanse wounds to sacrum and mid back with VASHE wound cleanser.  Apply VASHE moist gauze to wound bed. Cover with foam dressing.  Change daily. Will not follow at this time.  Please re-consult if needed.  Mike Gip MSN, RN, FNP-BC CWON Wound, Ostomy, Continence Nurse Outpatient [facility] [phone] Pager [phone]

## 2024-01-13 ENCOUNTER — Inpatient Hospital Stay (HOSPITAL_COMMUNITY): Payer: BC Managed Care – PPO

## 2024-01-13 DIAGNOSIS — R7881 Bacteremia: Secondary | ICD-10-CM

## 2024-01-13 DIAGNOSIS — I38 Endocarditis, valve unspecified: Secondary | ICD-10-CM | POA: Diagnosis not present

## 2024-01-13 DIAGNOSIS — N390 Urinary tract infection, site not specified: Secondary | ICD-10-CM | POA: Diagnosis not present

## 2024-01-13 DIAGNOSIS — B964 Proteus (mirabilis) (morganii) as the cause of diseases classified elsewhere: Secondary | ICD-10-CM | POA: Diagnosis not present

## 2024-01-13 LAB — CULTURE, BLOOD (ROUTINE X 2)

## 2024-01-13 LAB — ECHOCARDIOGRAM COMPLETE
Area-P 1/2: 3.63 cm2
Calc EF: 74.8 %
Height: 63 in
P 1/2 time: 688 ms
S' Lateral: 2.3 cm
Single Plane A2C EF: 74.3 %
Single Plane A4C EF: 76 %
Weight: 2081.14 [oz_av]

## 2024-01-13 LAB — CBC
HCT: 25.6 % — ABNORMAL LOW (ref 36.0–46.0)
Hemoglobin: 8 g/dL — ABNORMAL LOW (ref 12.0–15.0)
MCH: 32.8 pg (ref 26.0–34.0)
MCHC: 31.3 g/dL (ref 30.0–36.0)
MCV: 104.9 fL — ABNORMAL HIGH (ref 80.0–100.0)
Platelets: 163 10*3/uL (ref 150–400)
RBC: 2.44 MIL/uL — ABNORMAL LOW (ref 3.87–5.11)
RDW: 14.6 % (ref 11.5–15.5)
WBC: 5.8 10*3/uL (ref 4.0–10.5)
nRBC: 0 % (ref 0.0–0.2)

## 2024-01-13 LAB — COMPREHENSIVE METABOLIC PANEL
ALT: 10 U/L (ref 0–44)
AST: 10 U/L — ABNORMAL LOW (ref 15–41)
Albumin: 2.4 g/dL — ABNORMAL LOW (ref 3.5–5.0)
Alkaline Phosphatase: 54 U/L (ref 38–126)
Anion gap: 7 (ref 5–15)
BUN: 53 mg/dL — ABNORMAL HIGH (ref 8–23)
CO2: 19 mmol/L — ABNORMAL LOW (ref 22–32)
Calcium: 8.8 mg/dL — ABNORMAL LOW (ref 8.9–10.3)
Chloride: 110 mmol/L (ref 98–111)
Creatinine, Ser: 2.26 mg/dL — ABNORMAL HIGH (ref 0.44–1.00)
GFR, Estimated: 24 mL/min — ABNORMAL LOW (ref >60)
Glucose, Bld: 270 mg/dL — ABNORMAL HIGH (ref 70–99)
Potassium: 4.7 mmol/L (ref 3.5–5.1)
Sodium: 136 mmol/L (ref 135–145)
Total Bilirubin: 0.4 mg/dL (ref 0.0–1.2)
Total Protein: 5.3 g/dL — ABNORMAL LOW (ref 6.5–8.1)

## 2024-01-13 LAB — CYCLOSPORINE: Cyclosporine, LabCorp: 282 ng/mL (ref 100–400)

## 2024-01-13 LAB — GLUCOSE, CAPILLARY
Glucose-Capillary: 292 mg/dL — ABNORMAL HIGH (ref 70–99)
Glucose-Capillary: 261 mg/dL — ABNORMAL HIGH (ref 70–99)
Glucose-Capillary: 346 mg/dL — ABNORMAL HIGH (ref 70–99)
Glucose-Capillary: 373 mg/dL — ABNORMAL HIGH (ref 70–99)
Glucose-Capillary: 399 mg/dL — ABNORMAL HIGH (ref 70–99)

## 2024-01-13 LAB — PHOSPHORUS: Phosphorus: 3.5 mg/dL (ref 2.5–4.6)

## 2024-01-13 LAB — MAGNESIUM: Magnesium: 1.8 mg/dL (ref 1.7–2.4)

## 2024-01-13 MED ORDER — SODIUM CHLORIDE 0.9 % IV SOLN: INTRAVENOUS | Status: AC

## 2024-01-13 NOTE — Inpatient Diabetes Management (Signed)
Inpatient Diabetes Program Recommendations  AACE/ADA: New Consensus Statement on Inpatient Glycemic Control (2015)  Target Ranges:  Prepandial:   less than 140 mg/dL      Peak postprandial:   less than 180 mg/dL (1-2 hours)      Critically ill patients:  140 - 180 mg/dL   Lab Results  Component Value Date   GLUCAP 346 (H) 01/13/2024   HGBA1C 7.2 (H) 01/09/2024    Review of Glycemic Control  Diabetes history: DM type1 Outpatient Diabetes medications: OmniPod insulin pump with Dexcom G6,uses Humalog, if pump not on, Lantus 14 daily Current orders for Inpatient glycemic control: Insulin pump OmniPod, Prednisone 5 mg with breakfast   Current Basal--total 7.05 units/24 ICR-1 units for every 15 carb Insulin sensitivity factor-60 mg/dL (1 unit drops her 60 points Target BG-12am-8am--130                        8am-5pm-140                        5pm-MN-130   Spoke with pt at bedside due to her having her insulin pump on and her glucose trends running in the 200-300 range. Pt prefers to stay on her insulin pump pt self adjusts the rates currently from 12A-8A basal rates are 0.2 units/hour, from 8A-12A basal rates are 0.25 units/hour. Pt reports when she is in the bed her glucose increases. Pt is very brittle and sensitive to insulin. Pt has titrated her basal rates since her last Endocrinology visit from 0.15 units/hour to 0.2 and 0.25 units/hour. Will follow ands monitor her glucose trends while here. May have to switch to SQ insulin, however pt reports not being well controlled on basal bolus.   Thanks,  Christena Deem RN, MSN, BC-ADM Inpatient Diabetes Coordinator Team Pager [phone] (8a-5p)

## 2024-01-13 NOTE — Progress Notes (Signed)
[facility]  Date of Admission:  01/11/2024   Total days of inpatient antibiotics 2  Principal Problem:   Gram-positive bacteremia Active Problems:   History of renal transplant   CKD stage 3b, GFR 30-44 ml/min (HCC)   Hypothyroidism   Insulin dependent type 1 diabetes mellitus (HCC)   Chronic indwelling Foley catheter   Pressure ulcer   Peripheral neuropathy          Assessment: 63 year old female who was discharged on 1/14 called back to the ED for positive blood cultures #E faecalis and Staph hominis blood cultures #Proteus mirabilis UTI - Patient presented to the ED initially on 1/14 with her Foley reportedly blocked.  She had a temp of 102 in the ED.  Started on ceftriaxone.  Foley exchanged patient felt better.  Urine cultures grew (mirabilis discharged on Bactrim complete 7 days. - Called back on 1/15 as blood cultures grew E faecalis.  Blood cultures on 1/14, 1/4 bottles growing Staph hominis(contaminant) and E faecalis.  Unclear if this is true bacteremia( Bactrim and ceftriaxone not have effectively cleared bacteremia).  Will continue ampicillin.  Follow repeat blood cultures.   If repeat blood cultures are negative, TTE negative then for go tee.  Recommendations:  -Follow repeat blood Cx -Continue ampicllin(covers proteus as well) -TTE -If blood Cx NG x 3 days and TTE benign can transiton to amoxicillin 1gm tid x 2 weeks from negative blood Cx eot 1/29 -ID f/u on 1/28  Dr. Renold Don will be covering the weekend.   Microbiology:   Antibiotics: Ceftriaxone 1/13-14  Ampicillin 1/15-     Cultures: Blood 1/13 1/4 bottles e faecalis and staph hominis 1/16 Blood Cx NG x1   SUBJECTIVE: Resting in bed. Eating breakfast  Interval: Afebrile overnight. Wbc 5.8k  Review of Systems: Review of Systems  All other systems reviewed and are negative.    Scheduled Meds:  azaTHIOprine  50 mg Oral Daily   Chlorhexidine Gluconate Cloth  6  each Topical Daily   cycloSPORINE modified  75 mg Oral BID   heparin  5,000 Units Subcutaneous Q8H   insulin pump   Subcutaneous TID WC, HS, 0200   levothyroxine  100 mcg Oral Q0600   predniSONE  5 mg Oral Q breakfast   sodium bicarbonate  650 mg Oral TID   sodium chloride flush  3 mL Intravenous Q12H   sodium chloride flush  3 mL Intravenous Q12H   Continuous Infusions:  sodium chloride 100 mL/hr at 01/13/24 0953   ampicillin (OMNIPEN) IV 2 g (01/13/24 0952)   PRN Meds:.acetaminophen **OR** acetaminophen, ondansetron **OR** ondansetron (ZOFRAN) IV, sodium chloride flush No Known Allergies  OBJECTIVE: Vitals:   01/12/24 2315 01/13/24 0203 01/13/24 0528 01/13/24 0954  BP:  131/68 (!) 145/72 (!) 149/66  Pulse:  68 70 71  Resp:  14 16 18   Temp:  98.5 F (36.9 C) 97.9 F (36.6 C) 98.2 F (36.8 C)  TempSrc:  Oral Oral   SpO2:  97% 97% 100%  Weight:      Height: 5\' 3"  (1.6 m)      Body mass index is 23.04 kg/m.  Physical Exam Constitutional:      Appearance: Normal appearance.  HENT:     Head: Normocephalic and atraumatic.     Right Ear: Tympanic membrane normal.     Left Ear: Tympanic membrane normal.     Nose: Nose normal.     Mouth/Throat:  Mouth: Mucous membranes are moist.  Eyes:     Extraocular Movements: Extraocular movements intact.     Conjunctiva/sclera: Conjunctivae normal.     Pupils: Pupils are equal, round, and reactive to light.  Cardiovascular:     Rate and Rhythm: Normal rate and regular rhythm.     Heart sounds: No murmur heard.    No friction rub. No gallop.  Pulmonary:     Effort: Pulmonary effort is normal.     Breath sounds: Normal breath sounds.  Abdominal:     General: Abdomen is flat.     Palpations: Abdomen is soft.  Musculoskeletal:        General: Normal range of motion.  Skin:    General: Skin is warm and dry.  Neurological:     General: No focal deficit present.     Mental Status: She is alert and oriented to person, place,  and time.  Psychiatric:        Mood and Affect: Mood normal.       Lab Results Lab Results  Component Value Date   WBC 5.8 01/13/2024   HGB 8.0 (L) 01/13/2024   HCT 25.6 (L) 01/13/2024   MCV 104.9 (H) 01/13/2024   PLT 163 01/13/2024    Lab Results  Component Value Date   CREATININE 2.26 (H) 01/13/2024   BUN 53 (H) 01/13/2024   NA 136 01/13/2024   K 4.7 01/13/2024   CL 110 01/13/2024   CO2 19 (L) 01/13/2024    Lab Results  Component Value Date   ALT 10 01/13/2024   AST 10 (L) 01/13/2024   ALKPHOS 54 01/13/2024   BILITOT 0.4 01/13/2024        Danelle Earthly, MD [facility] 01/13/2024, 10:44 AM I have personally spent 52 minutes involved in face-to-face and non-face-to-face activities for this patient on the day of the visit. Professional time spent includes the following activities: Preparing to see the patient (review of tests), Obtaining and/or reviewing separately obtained history (admission/discharge record), Performing a medically appropriate examination and/or evaluation , Ordering medications/tests/procedures, referring and communicating with other health care professionals, Documenting clinical information in the EMR, Independently interpreting results (not separately reported), Communicating results to the patient/family/caregiver, Counseling and educating the patient/family/caregiver and Care coordination (not separately reported).

## 2024-01-13 NOTE — Progress Notes (Signed)
PROGRESS NOTE  [name] QMV:[id number] DOB: September 24, 1961 DOA: 01/11/2024 PCP: Stevphen Rochester, MD   LOS: 1 day   Brief Narrative / Interim history: 63 year old female with history of chronic indwelling Foley catheter, DM2, prior renal transplant with CKD 3A, hypothyroidism, right-sided BKA, chronic pressure ulcers, generalized deconditioning, peripheral neuropathy comes into the hospital after being called that her blood cultures were positive.  She was hospitalized 1/14-1/15 for sepsis due to UTI, catheter was changed and she was discharged with Bactrim.  Blood cultures came back positive for Enterococcus bacteremia and she was called to come back to the hospital.  No fever or chills, overall feeling good  Subjective / 24h Interval events: She is feeling Calmer.  She denies any fever or chills, no abdominal pain, no nausea or vomiting.  Assesement and Plan: Principal Problem:   Gram-positive bacteremia Active Problems:   History of renal transplant   CKD stage 3b, GFR 30-44 ml/min (HCC)   Hypothyroidism   Insulin dependent type 1 diabetes mellitus (HCC)   Chronic indwelling Foley catheter   Pressure ulcer   Peripheral neuropathy  Principal problem Enterococcus faecalis bacteremia -on blood cultures obtained 1/15.  Blood cultures also showed staph auricularis in 1/4 bottles.  She was told to come back mainly for the Enterococcus findings.  ID consulted, discussed with Dr. Thedore Mins, possibly a contaminant -Has been placed on ampicillin, continue -2D echo pending, surveillance cultures pending, no growth to date  Active problems Proteus UTI-noticed on the urine culture obtained 1/13, pansensitive except for nitrofurantoin.  Continue ampicillin.  Urinary catheter was exchanged at that time  Acute kidney injury chronic kidney disease stage IIIb -baseline creatinine ranging between 1.6-1.8, creatinine increasing today to 2.2 range.  Case was discussed with Dr. Marisue Humble with  nephrology, this is likely to be Bactrim induced -Will closely monitor over the next 24 hours, provide IV fluids, Bactrim has been discontinued  History of renal transplant-continue home medications, discussed with nephrology, continue current suppressive regimen as is without changes  Hypothyroidism-continue Synthroid  Essential hypertension-hold home lisinopril  Chronic sacral pressure ulcer-wound care consult  Chronic indwelling Foley catheter-follows with urology as an outpatient in [place].  Foley has been changed 1/13  Anemia of chronic renal disease-hemoglobin stable, no bleeding  DM type I-continue insulin pump  Lab Results  Component Value Date   HGBA1C 7.2 (H) 01/09/2024   CBG (last 3)  Recent Labs    01/12/24 2054 01/13/24 0201 01/13/24 0718  GLUCAP 276* 292* 261*    Scheduled Meds:  azaTHIOprine  50 mg Oral Daily   Chlorhexidine Gluconate Cloth  6 each Topical Daily   cycloSPORINE modified  75 mg Oral BID   heparin  5,000 Units Subcutaneous Q8H   insulin pump   Subcutaneous TID WC, HS, 0200   levothyroxine  100 mcg Oral Q0600   predniSONE  5 mg Oral Q breakfast   sodium bicarbonate  650 mg Oral TID   sodium chloride flush  3 mL Intravenous Q12H   sodium chloride flush  3 mL Intravenous Q12H   Continuous Infusions:  sodium chloride 100 mL/hr at 01/13/24 0953   ampicillin (OMNIPEN) IV 2 g (01/13/24 0952)   PRN Meds:.acetaminophen **OR** acetaminophen, ondansetron **OR** ondansetron (ZOFRAN) IV, sodium chloride flush  Current Outpatient Medications  Medication Instructions   azaTHIOprine (IMURAN) 50 mg, Oral, Daily   cycloSPORINE modified (NEORAL) 75 mg, Oral, 2 times daily   levothyroxine (SYNTHROID) 100 mcg, Oral, Daily before breakfast   lisinopril (ZESTRIL) 5 mg,  Oral, Daily at bedtime   NovoLOG 100 Units, Subcutaneous, See admin instructions, Per Omnipod insulin pump    predniSONE (DELTASONE) 5 mg, Oral, Daily with breakfast   sucralfate  (CARAFATE) 1 g, Oral, 3 times daily with meals & bedtime   sulfamethoxazole-trimethoprim (BACTRIM DS) 800-160 MG tablet 1 tablet, Oral, 2 times daily    Diet Orders (From admission, onward)     Start     Ordered   01/12/24 0125  Diet heart healthy/carb modified Room service appropriate? Yes; Fluid consistency: Thin  Diet effective now       Question Answer Comment  Diet-HS Snack? Nothing   Room service appropriate? Yes   Fluid consistency: Thin      01/12/24 0124            DVT prophylaxis: heparin injection 5,000 Units Start: 01/12/24 0600 SCDs Start: 01/12/24 0125 Place TED hose Start: 01/12/24 0125   Lab Results  Component Value Date   PLT 163 01/13/2024      Code Status: Limited: Do not attempt resuscitation (DNR) -DNR-LIMITED -Do Not Intubate/DNI   Family Communication: No family at bedside  Status is: Inpatient Remains inpatient appropriate because: Severity of illness   Level of care: Med-Surg  Consultants:  ID  Objective: Vitals:   01/12/24 2315 01/13/24 0203 01/13/24 0528 01/13/24 0954  BP:  131/68 (!) 145/72 (!) 149/66  Pulse:  68 70 71  Resp:  14 16 18   Temp:  98.5 F (36.9 C) 97.9 F (36.6 C) 98.2 F (36.8 C)  TempSrc:  Oral Oral   SpO2:  97% 97% 100%  Weight:      Height: 5\' 3"  (1.6 m)       Intake/Output Summary (Last 24 hours) at 01/13/2024 1050 Last data filed at 01/13/2024 0900 Gross per 24 hour  Intake 870 ml  Output 1150 ml  Net -280 ml   Wt Readings from Last 3 Encounters:  01/11/24 59 kg  01/09/24 59 kg  12/12/23 59 kg    Examination:  Constitutional: NAD Eyes: lids and conjunctivae normal, no scleral icterus ENMT: mmm Neck: normal, supple Respiratory: clear to auscultation bilaterally, no wheezing, no crackles. Normal respiratory effort.  Cardiovascular: Regular rate and rhythm, no murmurs / rubs / gallops. No LE edema. Abdomen: soft, no distention, no tenderness. Bowel sounds positive.   Data Reviewed: I have  independently reviewed following labs and imaging studies   CBC Recent Labs  Lab 01/09/24 1553 01/10/24 0520 01/11/24 1756 01/12/24 0204 01/13/24 0357  WBC 9.9 9.7 7.0 6.6 5.8  HGB 10.5* 8.5* 9.6* 9.3* 8.0*  HCT 34.2* 27.6* 31.0* 30.4* 25.6*  PLT 193 155 171 192 163  MCV 107.5* 107.8* 106.5* 107.0* 104.9*  MCH 33.0 33.2 33.0 32.7 32.8  MCHC 30.7 30.8 31.0 30.6 31.3  RDW 14.7 14.8 14.5 14.6 14.6  LYMPHSABS 1.5  --  0.8  --   --   MONOABS 0.8  --  0.3  --   --   EOSABS 0.2  --  0.0  --   --   BASOSABS 0.0  --  0.0  --   --     Recent Labs  Lab  0000 01/09/24 1549 01/09/24 1553 01/09/24 1755 01/09/24 1800 01/09/24 2258 01/10/24 0520 01/10/24 1409 01/11/24 1756 01/12/24 0204 01/13/24 0357  NA   < >  --  135  --   --   --  138 137 134* 132* 136  K   < >  --  5.4*  --   --   --  4.7 5.0 4.8 4.5 4.7  CL   < >  --  111  --   --   --  114* 111 108 106 110  CO2   < >  --  19*  --   --   --  19* 20* 17* 17* 19*  GLUCOSE   < >  --  94  --   --   --  166* 72 342* 261* 270*  BUN   < >  --  61*  --   --   --  52* 53* 52* 50* 53*  CREATININE  --   --  1.73*  --    < >  --  1.80* 1.76* 1.66* 1.78* 2.26*  CALCIUM   < >  --  9.0  --   --   --  8.6* 8.7* 9.2 8.8* 8.8*  AST  --   --  30  --   --   --   --   --  17 15 10*  ALT  --   --  16  --   --   --   --   --  14 12 10   ALKPHOS  --   --  78  --   --   --   --   --  71 69 54  BILITOT  --   --  0.8  --   --   --   --   --  0.6 0.3 0.4  ALBUMIN  --   --  3.4*  --   --   --   --   --  3.2* 2.9* 2.4*  MG  --   --   --   --   --   --   --   --   --   --  1.8  LATICACIDVEN  --  0.9  --  1.5  --   --   --   --   --   --   --   INR  --   --  1.0  --   --   --  1.2  --   --   --   --   HGBA1C  --   --   --   --   --  7.2*  --   --   --   --   --    < > = values in this interval not displayed.    ------------------------------------------------------------------------------------------------------------------ No results for input(s):  "CHOL", "HDL", "LDLCALC", "TRIG", "CHOLHDL", "LDLDIRECT" in the last 72 hours.  Lab Results  Component Value Date   HGBA1C 7.2 (H) 01/09/2024   ------------------------------------------------------------------------------------------------------------------ No results for input(s): "TSH", "T4TOTAL", "T3FREE", "THYROIDAB" in the last 72 hours.  Invalid input(s): "FREET3"  Cardiac Enzymes No results for input(s): "CKMB", "TROPONINI", "MYOGLOBIN" in the last 168 hours.  Invalid input(s): "CK" ------------------------------------------------------------------------------------------------------------------ No results found for: "BNP"  CBG: Recent Labs  Lab 01/12/24 1122 01/12/24 1621 01/12/24 2054 01/13/24 0201 01/13/24 0718  GLUCAP 231* 187* 276* 292* 261*    Recent Results (from the past 240 hours)  Culture, blood (Routine x 2)     Status: None (Preliminary result)   Collection Time: 01/09/24  3:45 PM   Specimen: BLOOD  Result Value Ref Range Status   Specimen Description   Final    BLOOD SITE NOT SPECIFIED Performed at [facility], [address]  [place], [place] [address]    Special Requests   Final    BOTTLES DRAWN AEROBIC AND ANAEROBIC Blood Culture results may not be optimal due to an inadequate volume of blood received in culture bottles Performed at [facility], [address], [place], [place] [address]    Culture   Final    NO GROWTH 4 DAYS Performed at [facility], [address] [place], [place] [address]    Report Status PENDING  Incomplete  Urine Culture     Status: Abnormal   Collection Time: 01/09/24  3:47 PM   Specimen: Urine, Random  Result Value Ref Range Status   Specimen Description   Final    URINE, RANDOM Performed at [facility], [address], [place], [place] [address]    Special Requests   Final    NONE Reflexed from [id number] Performed at [facility], [address], [place], [place] [address]    Culture >=100,000 COLONIES/mL PROTEUS MIRABILIS (A)  Final   Report Status 01/11/2024 FINAL  Final   Organism ID, Bacteria PROTEUS MIRABILIS (A)  Final      Susceptibility   Proteus mirabilis - MIC*    AMPICILLIN <=2 SENSITIVE Sensitive     CEFAZOLIN <=4 SENSITIVE Sensitive     CEFEPIME <=0.12 SENSITIVE Sensitive     CEFTRIAXONE <=0.25 SENSITIVE Sensitive     CIPROFLOXACIN <=0.25 SENSITIVE Sensitive     GENTAMICIN <=1 SENSITIVE Sensitive     IMIPENEM 2 SENSITIVE Sensitive     NITROFURANTOIN 128 RESISTANT Resistant     TRIMETH/SULFA <=20 SENSITIVE Sensitive     AMPICILLIN/SULBACTAM <=2 SENSITIVE Sensitive     PIP/TAZO <=4 SENSITIVE Sensitive ug/mL    * >=100,000 COLONIES/mL PROTEUS MIRABILIS  Resp panel by RT-PCR (RSV, Flu A&B, Covid) Anterior Nasal Swab     Status: None   Collection Time: 01/09/24  4:45 PM   Specimen: Anterior Nasal Swab  Result Value Ref Range Status   SARS Coronavirus 2 by RT PCR NEGATIVE NEGATIVE Final    Comment: (NOTE) SARS-CoV-2 target nucleic acids are NOT DETECTED.  The SARS-CoV-2 RNA is generally detectable in upper respiratory specimens during the acute phase of infection. The lowest concentration of SARS-CoV-2 viral copies this assay can detect is 138 copies/mL. A negative result does not preclude SARS-Cov-2 infection and should not be used as the sole basis for treatment or other patient management decisions. A negative result may occur with  improper specimen collection/handling, submission of specimen other than nasopharyngeal swab, presence of viral mutation(s) within the areas targeted by this assay, and inadequate number of viral copies(<138 copies/mL). A negative result must be combined with clinical observations, patient history, and epidemiological information. The expected result is Negative.  Fact Sheet for Patients:  BloggerCourse.com  Fact Sheet for  Healthcare Providers:  SeriousBroker.it  This test is no t yet approved or cleared by the [place] FDA and  has been authorized for detection and/or diagnosis of SARS-CoV-2 by FDA under an Emergency Use Authorization (EUA). This EUA will remain  in effect (meaning this test can be used) for the duration of the COVID-19 declaration under Section 564(b)(1) of the Act, 21 U.S.C.section 360bbb-3(b)(1), unless the authorization is terminated  or revoked sooner.       Influenza A by PCR NEGATIVE NEGATIVE Final   Influenza B by PCR NEGATIVE NEGATIVE Final    Comment: (NOTE) The Xpert Xpress SARS-CoV-2/FLU/RSV plus assay is intended as an aid  in the diagnosis of influenza from Nasopharyngeal swab specimens and should not be used as a sole basis for treatment. Nasal washings and aspirates are unacceptable for Xpert Xpress SARS-CoV-2/FLU/RSV testing.  Fact Sheet for Patients: BloggerCourse.com  Fact Sheet for Healthcare Providers: SeriousBroker.it  This test is not yet approved or cleared by the [place] FDA and has been authorized for detection and/or diagnosis of SARS-CoV-2 by FDA under an Emergency Use Authorization (EUA). This EUA will remain in effect (meaning this test can be used) for the duration of the COVID-19 declaration under Section 564(b)(1) of the Act, 21 U.S.C. section 360bbb-3(b)(1), unless the authorization is terminated or revoked.     Resp Syncytial Virus by PCR NEGATIVE NEGATIVE Final    Comment: (NOTE) Fact Sheet for Patients: BloggerCourse.com  Fact Sheet for Healthcare Providers: SeriousBroker.it  This test is not yet approved or cleared by the [place] FDA and has been authorized for detection and/or diagnosis of SARS-CoV-2 by FDA under an Emergency Use Authorization (EUA). This EUA will remain in effect (meaning this  test can be used) for the duration of the COVID-19 declaration under Section 564(b)(1) of the Act, 21 U.S.C. section 360bbb-3(b)(1), unless the authorization is terminated or revoked.  Performed at [facility], [address], [place], [place] [address]   Culture, blood (Routine x 2)     Status: Abnormal   Collection Time: 01/09/24  4:56 PM   Specimen: BLOOD LEFT ARM  Result Value Ref Range Status   Specimen Description   Final    BLOOD LEFT ARM Performed at [facility], [address] [place], [place] [address]    Special Requests   Final    BOTTLES DRAWN AEROBIC AND ANAEROBIC Blood Culture results may not be optimal due to an inadequate volume of blood received in culture bottles Performed at [facility], [address], [place], [place] [address]    Culture  Setup Time   Final    GRAM POSITIVE COCCI IN CLUSTERS BOTTLES DRAWN AEROBIC ONLY CRITICAL RESULT CALLED TO, READ BACK BY AND VERIFIED WITH: J DANIELS MD 01/11/2024 @ 0009 BY AB GRAM POSITIVE COCCI IN CHAINS ANAEROBIC BOTTLE ONLY CRITICAL RESULT CALLED TO, READ BACK BY AND VERIFIED WITH: DR Gwendolyn Grant [id number] AT 801 BY CM Performed at [facility], [address], [place], [place] [address]    Culture (A)  Final    STAPHYLOCOCCUS AURICULARIS THE SIGNIFICANCE OF ISOLATING THIS ORGANISM FROM A SINGLE SET OF BLOOD CULTURES WHEN MULTIPLE SETS ARE DRAWN IS UNCERTAIN. PLEASE NOTIFY THE MICROBIOLOGY DEPARTMENT WITHIN ONE WEEK IF SPECIATION AND SENSITIVITIES ARE REQUIRED. ENTEROCOCCUS FAECALIS    Report Status 01/13/2024 FINAL  Final   Organism ID, Bacteria ENTEROCOCCUS FAECALIS  Final      Susceptibility   Enterococcus faecalis - MIC*    AMPICILLIN <=2 SENSITIVE Sensitive     VANCOMYCIN 1 SENSITIVE Sensitive     GENTAMICIN SYNERGY SENSITIVE Sensitive     * ENTEROCOCCUS FAECALIS  Blood Culture ID Panel (Reflexed)     Status: Abnormal   Collection Time: 01/09/24   4:56 PM  Result Value Ref Range Status   Enterococcus faecalis NOT DETECTED NOT DETECTED Final   Enterococcus Faecium NOT DETECTED NOT DETECTED Final   Listeria monocytogenes NOT DETECTED NOT DETECTED Final   Staphylococcus species DETECTED (A) NOT DETECTED Final    Comment: CRITICAL RESULT CALLED TO, READ BACK BY AND VERIFIED WITH: J DANIELS MD 01/11/2024 @ 0009 BY  AB    Staphylococcus aureus (BCID) NOT DETECTED NOT DETECTED Final   Staphylococcus epidermidis NOT DETECTED NOT DETECTED Final   Staphylococcus lugdunensis NOT DETECTED NOT DETECTED Final   Streptococcus species NOT DETECTED NOT DETECTED Final   Streptococcus agalactiae NOT DETECTED NOT DETECTED Final   Streptococcus pneumoniae NOT DETECTED NOT DETECTED Final   Streptococcus pyogenes NOT DETECTED NOT DETECTED Final   A.calcoaceticus-baumannii NOT DETECTED NOT DETECTED Final   Bacteroides fragilis NOT DETECTED NOT DETECTED Final   Enterobacterales NOT DETECTED NOT DETECTED Final   Enterobacter cloacae complex NOT DETECTED NOT DETECTED Final   Escherichia coli NOT DETECTED NOT DETECTED Final   Klebsiella aerogenes NOT DETECTED NOT DETECTED Final   Klebsiella oxytoca NOT DETECTED NOT DETECTED Final   Klebsiella pneumoniae NOT DETECTED NOT DETECTED Final   Proteus species NOT DETECTED NOT DETECTED Final   Salmonella species NOT DETECTED NOT DETECTED Final   Serratia marcescens NOT DETECTED NOT DETECTED Final   Haemophilus influenzae NOT DETECTED NOT DETECTED Final   Neisseria meningitidis NOT DETECTED NOT DETECTED Final   Pseudomonas aeruginosa NOT DETECTED NOT DETECTED Final   Stenotrophomonas maltophilia NOT DETECTED NOT DETECTED Final   Candida albicans NOT DETECTED NOT DETECTED Final   Candida auris NOT DETECTED NOT DETECTED Final   Candida glabrata NOT DETECTED NOT DETECTED Final   Candida krusei NOT DETECTED NOT DETECTED Final   Candida parapsilosis NOT DETECTED NOT DETECTED Final   Candida tropicalis NOT DETECTED  NOT DETECTED Final   Cryptococcus neoformans/gattii NOT DETECTED NOT DETECTED Final    Comment: Performed at [facility], [address], [place], [place] [address]  MRSA Next Gen by PCR, Nasal     Status: None   Collection Time: 01/10/24  1:07 PM   Specimen: Nasal Mucosa; Nasal Swab  Result Value Ref Range Status   MRSA by PCR Next Gen NOT DETECTED NOT DETECTED Final    Comment: (NOTE) The GeneXpert MRSA Assay (FDA approved for NASAL specimens only), is one component of a comprehensive MRSA colonization surveillance program. It is not intended to diagnose MRSA infection nor to guide or monitor treatment for MRSA infections. Test performance is not FDA approved in patients less than 24 years old. Performed at [facility], [address], [place], [place] [address]   Blood Culture ID Panel (Reflexed)     Status: Abnormal   Collection Time: 01/11/24  4:46 AM  Result Value Ref Range Status   Enterococcus faecalis DETECTED (A) NOT DETECTED Final    Comment: CRITICAL RESULT CALLED TO, READ BACK BY AND VERIFIED WITH: DR Gwendolyn Grant [id number] AT 0801 BY CM    Enterococcus Faecium NOT DETECTED NOT DETECTED Final   Listeria monocytogenes NOT DETECTED NOT DETECTED Final   Staphylococcus species NOT DETECTED NOT DETECTED Final   Staphylococcus aureus (BCID) NOT DETECTED NOT DETECTED Final   Staphylococcus epidermidis NOT DETECTED NOT DETECTED Final   Staphylococcus lugdunensis NOT DETECTED NOT DETECTED Final   Streptococcus species NOT DETECTED NOT DETECTED Final   Streptococcus agalactiae NOT DETECTED NOT DETECTED Final   Streptococcus pneumoniae NOT DETECTED NOT DETECTED Final   Streptococcus pyogenes NOT DETECTED NOT DETECTED Final   A.calcoaceticus-baumannii NOT DETECTED NOT DETECTED Final   Bacteroides fragilis NOT DETECTED NOT DETECTED Final   Enterobacterales NOT DETECTED NOT DETECTED Final   Enterobacter cloacae complex NOT DETECTED NOT DETECTED Final    Escherichia coli NOT DETECTED NOT DETECTED Final   Klebsiella aerogenes NOT DETECTED NOT DETECTED Final  Klebsiella oxytoca NOT DETECTED NOT DETECTED Final   Klebsiella pneumoniae NOT DETECTED NOT DETECTED Final   Proteus species NOT DETECTED NOT DETECTED Final   Salmonella species NOT DETECTED NOT DETECTED Final   Serratia marcescens NOT DETECTED NOT DETECTED Final   Haemophilus influenzae NOT DETECTED NOT DETECTED Final   Neisseria meningitidis NOT DETECTED NOT DETECTED Final   Pseudomonas aeruginosa NOT DETECTED NOT DETECTED Final   Stenotrophomonas maltophilia NOT DETECTED NOT DETECTED Final   Candida albicans NOT DETECTED NOT DETECTED Final   Candida auris NOT DETECTED NOT DETECTED Final   Candida glabrata NOT DETECTED NOT DETECTED Final   Candida krusei NOT DETECTED NOT DETECTED Final   Candida parapsilosis NOT DETECTED NOT DETECTED Final   Candida tropicalis NOT DETECTED NOT DETECTED Final   Cryptococcus neoformans/gattii NOT DETECTED NOT DETECTED Final   Vancomycin resistance NOT DETECTED NOT DETECTED Final    Comment: Performed at [facility], [address] [place], [place] [address]  Culture, blood (Routine X 2) w Reflex to ID Panel     Status: None (Preliminary result)   Collection Time: 01/12/24  1:57 AM   Specimen: BLOOD  Result Value Ref Range Status   Specimen Description   Final    BLOOD LEFT ANTECUBITAL Performed at [facility], [address] [place], [place] [address]    Special Requests   Final    BOTTLES DRAWN AEROBIC AND ANAEROBIC Blood Culture results may not be optimal due to an inadequate volume of blood received in culture bottles Performed at [facility], [address] [place], [place] [address]    Culture   Final    NO GROWTH 1 DAY Performed at [facility], [address], [place], [place] [address]    Report Status PENDING  Incomplete  Culture, blood (Routine X 2) w Reflex to ID Panel      Status: None (Preliminary result)   Collection Time: 01/12/24  2:04 AM   Specimen: BLOOD  Result Value Ref Range Status   Specimen Description   Final    BLOOD BLOOD LEFT FOREARM Performed at [facility], [address] [place], [place] [address]    Special Requests   Final    BOTTLES DRAWN AEROBIC AND ANAEROBIC Blood Culture results may not be optimal due to an inadequate volume of blood received in culture bottles Performed at [facility], [address] [place], [place] [address]    Culture   Final    NO GROWTH 1 DAY Performed at [facility], [address] [place], [place] [address]    Report Status PENDING  Incomplete     Radiology Studies: No results found.   Pamella Pert, MD, PhD [facility]  Between 7 am - 7 pm I am available, please contact me via Amion (for emergencies) or Securechat (non urgent messages)  Between 7 pm - 7 am I am not available, please contact night coverage MD/APP via Amion

## 2024-01-14 DIAGNOSIS — R7881 Bacteremia: Secondary | ICD-10-CM | POA: Diagnosis not present

## 2024-01-14 LAB — BASIC METABOLIC PANEL
Anion gap: 4 — ABNORMAL LOW (ref 5–15)
BUN: 55 mg/dL — ABNORMAL HIGH (ref 8–23)
CO2: 22 mmol/L (ref 22–32)
Calcium: 8.7 mg/dL — ABNORMAL LOW (ref 8.9–10.3)
Chloride: 108 mmol/L (ref 98–111)
Creatinine, Ser: 1.99 mg/dL — ABNORMAL HIGH (ref 0.44–1.00)
GFR, Estimated: 28 mL/min — ABNORMAL LOW (ref >60)
Glucose, Bld: 299 mg/dL — ABNORMAL HIGH (ref 70–99)
Potassium: 4.9 mmol/L (ref 3.5–5.1)
Sodium: 134 mmol/L — ABNORMAL LOW (ref 135–145)

## 2024-01-14 LAB — CBC
HCT: 27.1 % — ABNORMAL LOW (ref 36.0–46.0)
Hemoglobin: 8.2 g/dL — ABNORMAL LOW (ref 12.0–15.0)
MCH: 32.5 pg (ref 26.0–34.0)
MCHC: 30.3 g/dL (ref 30.0–36.0)
MCV: 107.5 fL — ABNORMAL HIGH (ref 80.0–100.0)
Platelets: 158 10*3/uL (ref 150–400)
RBC: 2.52 MIL/uL — ABNORMAL LOW (ref 3.87–5.11)
RDW: 14.2 % (ref 11.5–15.5)
WBC: 5 10*3/uL (ref 4.0–10.5)
nRBC: 0 % (ref 0.0–0.2)

## 2024-01-14 LAB — CULTURE, BLOOD (ROUTINE X 2): Culture: NO GROWTH

## 2024-01-14 LAB — GLUCOSE, CAPILLARY
Glucose-Capillary: 340 mg/dL — ABNORMAL HIGH (ref 70–99)
Glucose-Capillary: 271 mg/dL — ABNORMAL HIGH (ref 70–99)
Glucose-Capillary: 203 mg/dL — ABNORMAL HIGH (ref 70–99)
Glucose-Capillary: 141 mg/dL — ABNORMAL HIGH (ref 70–99)
Glucose-Capillary: 250 mg/dL — ABNORMAL HIGH (ref 70–99)
Glucose-Capillary: 222 mg/dL — ABNORMAL HIGH (ref 70–99)

## 2024-01-14 MED ORDER — SODIUM CHLORIDE 0.9 % IV BOLUS
500.0000 mL | Freq: Once | INTRAVENOUS | Status: AC
  Administered 2024-01-14: 500 mL via INTRAVENOUS

## 2024-01-14 NOTE — Plan of Care (Signed)
°  Problem: Coping: °Goal: Level of anxiety will decrease °Outcome: Progressing °  °

## 2024-01-14 NOTE — Progress Notes (Signed)
Patient refused wound care and to her back with the Vashe.  She said it stung really bad and did not want to change dressing during the night.  She said she might agree to it during the day. Info was passed to day nurse.

## 2024-01-14 NOTE — Progress Notes (Signed)
PROGRESS NOTE  [name] RUE:[id number] DOB: [birth year] DOA: 01/11/2024 PCP: Stevphen Rochester, MD   LOS: 2 days   Brief Narrative / Interim history: 63 year old female with history of chronic indwelling Foley catheter, DM2, prior renal transplant with CKD 3A, hypothyroidism, right-sided BKA, chronic pressure ulcers, generalized deconditioning, peripheral neuropathy comes into the hospital after being called that her blood cultures were positive.  She was hospitalized 1/14-1/15 for sepsis due to UTI, catheter was changed and she was discharged with Bactrim.  Blood cultures came back positive for Enterococcus bacteremia and she was called to come back to the hospital.  No fever or chills, overall feeling good  Subjective / 24h Interval events: Feeling better.  Stronger.  No fever or chills, no abdominal pain, no nausea or vomiting  Assesement and Plan: Principal Problem:   Gram-positive bacteremia Active Problems:   History of renal transplant   CKD stage 3b, GFR 30-44 ml/min (HCC)   Hypothyroidism   Insulin dependent type 1 diabetes mellitus (HCC)   Chronic indwelling Foley catheter   Pressure ulcer   Peripheral neuropathy  Principal problem Enterococcus faecalis bacteremia -on blood cultures obtained 1/15.  Blood cultures also showed staph auricularis in 1/4 bottles.  She was told to come back mainly for the Enterococcus findings.  ID consulted, discussed with Dr. Thedore Mins, possibly a contaminant -Continue ampicillin per infectious disease -2D echo 1/17 shows LVEF 60 to 65%, no WMA, normal RV and no evidence of endocarditis  Active problems Proteus UTI-noticed on the urine culture obtained 1/13, pansensitive except for nitrofurantoin.  Continue ampicillin.  Urinary catheter was exchanged at that time  Acute kidney injury chronic kidney disease stage IIIb -baseline creatinine ranging between 1.6-1.8, creatinine increasing to 2.2 on 1/17.  Case was discussed with Dr. Marisue Humble with  nephrology, this is likely to be Bactrim induced -Received fluids, creatinine better at 1.9.  Repeat fluids today  History of renal transplant-continue home medications, discussed with nephrology, continue current suppressive regimen as is without changes  Hypothyroidism-continue Synthroid  Essential hypertension-hold home lisinopril  Chronic sacral pressure ulcer-wound care consult  Chronic indwelling Foley catheter-follows with urology as an outpatient in [place].  Foley has been changed 1/13  Anemia of chronic renal disease-hemoglobin stable, no bleeding  DM type I, with hyperglycemia-continue insulin pump  Lab Results  Component Value Date   HGBA1C 7.2 (H) 01/09/2024   CBG (last 3)  Recent Labs    01/14/24 0058 01/14/24 0539 01/14/24 0819  GLUCAP 340* 271* 203*    Scheduled Meds:  azaTHIOprine  50 mg Oral Daily   Chlorhexidine Gluconate Cloth  6 each Topical Daily   cycloSPORINE modified  75 mg Oral BID   heparin  5,000 Units Subcutaneous Q8H   insulin pump   Subcutaneous TID WC, HS, 0200   levothyroxine  100 mcg Oral Q0600   predniSONE  5 mg Oral Q breakfast   sodium bicarbonate  650 mg Oral TID   sodium chloride flush  3 mL Intravenous Q12H   sodium chloride flush  3 mL Intravenous Q12H   Continuous Infusions:  sodium chloride 100 mL/hr at 01/13/24 0953   ampicillin (OMNIPEN) IV 2 g (01/14/24 0808)   PRN Meds:.acetaminophen **OR** acetaminophen, ondansetron **OR** ondansetron (ZOFRAN) IV, sodium chloride flush  Current Outpatient Medications  Medication Instructions   azaTHIOprine (IMURAN) 50 mg, Oral, Daily   cycloSPORINE modified (NEORAL) 75 mg, Oral, 2 times daily   levothyroxine (SYNTHROID) 100 mcg, Oral, Daily before breakfast   lisinopril (ZESTRIL) 5  mg, Oral, Daily at bedtime   NovoLOG 100 Units, Subcutaneous, See admin instructions, Per Omnipod insulin pump    predniSONE (DELTASONE) 5 mg, Oral, Daily with breakfast   sucralfate (CARAFATE) 1  g, Oral, 3 times daily with meals & bedtime   sulfamethoxazole-trimethoprim (BACTRIM DS) 800-160 MG tablet 1 tablet, Oral, 2 times daily    Diet Orders (From admission, onward)     Start     Ordered   01/12/24 0125  Diet heart healthy/carb modified Room service appropriate? Yes; Fluid consistency: Thin  Diet effective now       Question Answer Comment  Diet-HS Snack? Nothing   Room service appropriate? Yes   Fluid consistency: Thin      01/12/24 0124            DVT prophylaxis: heparin injection 5,000 Units Start: 01/12/24 0600 SCDs Start: 01/12/24 0125 Place TED hose Start: 01/12/24 0125   Lab Results  Component Value Date   PLT 158 01/14/2024      Code Status: Limited: Do not attempt resuscitation (DNR) -DNR-LIMITED -Do Not Intubate/DNI   Family Communication: No family at bedside  Status is: Inpatient Remains inpatient appropriate because: Severity of illness   Level of care: Med-Surg  Consultants:  ID  Objective: Vitals:   01/13/24 0954 01/13/24 1402 01/13/24 2016 01/14/24 0538  BP: (!) 149/66 (!) 122/59 119/73 128/65  Pulse: 71 76 66 67  Resp: 18 16 19 16   Temp: 98.2 F (36.8 C) 97.6 F (36.4 C) 98.3 F (36.8 C) 98.3 F (36.8 C)  TempSrc:      SpO2: 100% 99% 98% 97%  Weight:      Height:        Intake/Output Summary (Last 24 hours) at 01/14/2024 0959 Last data filed at 01/14/2024 0536 Gross per 24 hour  Intake 2455 ml  Output 1000 ml  Net 1455 ml   Wt Readings from Last 3 Encounters:  01/11/24 59 kg  01/09/24 59 kg  12/12/23 59 kg    Examination:  Constitutional: NAD Eyes: lids and conjunctivae normal, no scleral icterus ENMT: mmm Neck: normal, supple Respiratory: clear to auscultation bilaterally, no wheezing, no crackles. Normal respiratory effort.  Cardiovascular: Regular rate and rhythm, no murmurs / rubs / gallops. No LE edema. Abdomen: soft, no distention, no tenderness. Bowel sounds positive.   Data Reviewed: I have  independently reviewed following labs and imaging studies   CBC Recent Labs  Lab 01/09/24 1553 01/10/24 0520 01/11/24 1756 01/12/24 0204 01/13/24 0357 01/14/24 0411  WBC 9.9 9.7 7.0 6.6 5.8 5.0  HGB 10.5* 8.5* 9.6* 9.3* 8.0* 8.2*  HCT 34.2* 27.6* 31.0* 30.4* 25.6* 27.1*  PLT 193 155 171 192 163 158  MCV 107.5* 107.8* 106.5* 107.0* 104.9* 107.5*  MCH 33.0 33.2 33.0 32.7 32.8 32.5  MCHC 30.7 30.8 31.0 30.6 31.3 30.3  RDW 14.7 14.8 14.5 14.6 14.6 14.2  LYMPHSABS 1.5  --  0.8  --   --   --   MONOABS 0.8  --  0.3  --   --   --   EOSABS 0.2  --  0.0  --   --   --   BASOSABS 0.0  --  0.0  --   --   --     Recent Labs  Lab  0000 01/09/24 1549 01/09/24 1553 01/09/24 1755 01/09/24 1800 01/09/24 2258 01/10/24 0520 01/10/24 1409 01/11/24 1756 01/12/24 0204 01/13/24 0357 01/14/24 0411  NA   < >  --  135  --   --   --  138 137 134* 132* 136 134*  K   < >  --  5.4*  --   --   --  4.7 5.0 4.8 4.5 4.7 4.9  CL   < >  --  111  --   --   --  114* 111 108 106 110 108  CO2   < >  --  19*  --   --   --  19* 20* 17* 17* 19* 22  GLUCOSE   < >  --  94  --   --   --  166* 72 342* 261* 270* 299*  BUN   < >  --  61*  --   --   --  52* 53* 52* 50* 53* 55*  CREATININE  --   --  1.73*  --    < >  --  1.80* 1.76* 1.66* 1.78* 2.26* 1.99*  CALCIUM   < >  --  9.0  --   --   --  8.6* 8.7* 9.2 8.8* 8.8* 8.7*  AST  --   --  30  --   --   --   --   --  17 15 10*  --   ALT  --   --  16  --   --   --   --   --  14 12 10   --   ALKPHOS  --   --  78  --   --   --   --   --  71 69 54  --   BILITOT  --   --  0.8  --   --   --   --   --  0.6 0.3 0.4  --   ALBUMIN  --   --  3.4*  --   --   --   --   --  3.2* 2.9* 2.4*  --   MG  --   --   --   --   --   --   --   --   --   --  1.8  --   LATICACIDVEN  --  0.9  --  1.5  --   --   --   --   --   --   --   --   INR  --   --  1.0  --   --   --  1.2  --   --   --   --   --   HGBA1C  --   --   --   --   --  7.2*  --   --   --   --   --   --    < > = values in this  interval not displayed.    ------------------------------------------------------------------------------------------------------------------ No results for input(s): "CHOL", "HDL", "LDLCALC", "TRIG", "CHOLHDL", "LDLDIRECT" in the last 72 hours.  Lab Results  Component Value Date   HGBA1C 7.2 (H) 01/09/2024   ------------------------------------------------------------------------------------------------------------------ No results for input(s): "TSH", "T4TOTAL", "T3FREE", "THYROIDAB" in the last 72 hours.  Invalid input(s): "FREET3"  Cardiac Enzymes No results for input(s): "CKMB", "TROPONINI", "MYOGLOBIN" in the last 168 hours.  Invalid input(s): "CK" ------------------------------------------------------------------------------------------------------------------ No results found for: "BNP"  CBG: Recent Labs  Lab 01/13/24 1618 01/13/24 2017 01/14/24 0058 01/14/24 0539 01/14/24 0819  GLUCAP 373* 399* 340* 271* 203*    Recent Results (from the  past 240 hours)  Culture, blood (Routine x 2)     Status: None   Collection Time: 01/09/24  3:45 PM   Specimen: BLOOD  Result Value Ref Range Status   Specimen Description   Final    BLOOD SITE NOT SPECIFIED Performed at [facility], [address] [place], [place] [address]    Special Requests   Final    BOTTLES DRAWN AEROBIC AND ANAEROBIC Blood Culture results may not be optimal due to an inadequate volume of blood received in culture bottles Performed at [facility], [address], [place], [place] [address]    Culture   Final    NO GROWTH 5 DAYS Performed at [facility], [address], [place], [place] [address]    Report Status 01/14/2024 FINAL  Final  Urine Culture     Status: Abnormal   Collection Time: 01/09/24  3:47 PM   Specimen: Urine, Random  Result Value Ref Range Status   Specimen Description   Final    URINE, RANDOM Performed at [facility], [address], [place], [place] [address]    Special Requests   Final    NONE Reflexed from [id number] Performed at [facility], [address] [place], [place] [address]    Culture >=100,000 COLONIES/mL PROTEUS MIRABILIS (A)  Final   Report Status 01/11/2024 FINAL  Final   Organism ID, Bacteria PROTEUS MIRABILIS (A)  Final      Susceptibility   Proteus mirabilis - MIC*    AMPICILLIN <=2 SENSITIVE Sensitive     CEFAZOLIN <=4 SENSITIVE Sensitive     CEFEPIME <=0.12 SENSITIVE Sensitive     CEFTRIAXONE <=0.25 SENSITIVE Sensitive     CIPROFLOXACIN <=0.25 SENSITIVE Sensitive     GENTAMICIN <=1 SENSITIVE Sensitive     IMIPENEM 2 SENSITIVE Sensitive     NITROFURANTOIN 128 RESISTANT Resistant     TRIMETH/SULFA <=20 SENSITIVE Sensitive     AMPICILLIN/SULBACTAM <=2 SENSITIVE Sensitive     PIP/TAZO <=4 SENSITIVE Sensitive ug/mL    * >=100,000 COLONIES/mL PROTEUS MIRABILIS  Resp panel by RT-PCR (RSV, Flu A&B, Covid) Anterior Nasal Swab     Status: None   Collection Time: 01/09/24  4:45 PM   Specimen: Anterior Nasal Swab  Result Value Ref Range Status   SARS Coronavirus 2 by RT PCR NEGATIVE NEGATIVE Final    Comment: (NOTE) SARS-CoV-2 target nucleic acids are NOT DETECTED.  The SARS-CoV-2 RNA is generally detectable in upper respiratory specimens during the acute phase of infection. The lowest concentration of SARS-CoV-2 viral copies this assay can detect is 138 copies/mL. A negative result does not preclude SARS-Cov-2 infection and should not be used as the sole basis for treatment or other patient management decisions. A negative result may occur with  improper specimen collection/handling, submission of specimen other than nasopharyngeal swab, presence of viral mutation(s) within the areas targeted by this assay, and inadequate number of viral copies(<138 copies/mL). A negative result must be combined with clinical observations, patient history, and  epidemiological information. The expected result is Negative.  Fact Sheet for Patients:  BloggerCourse.com  Fact Sheet for Healthcare Providers:  SeriousBroker.it  This test is no t yet approved or cleared by the [place] FDA and  has been authorized for detection and/or diagnosis of SARS-CoV-2 by FDA under an Emergency Use Authorization (EUA). This EUA will remain  in effect (meaning this test can be used) for the duration of the  COVID-19 declaration under Section 564(b)(1) of the Act, 21 U.S.C.section 360bbb-3(b)(1), unless the authorization is terminated  or revoked sooner.       Influenza A by PCR NEGATIVE NEGATIVE Final   Influenza B by PCR NEGATIVE NEGATIVE Final    Comment: (NOTE) The Xpert Xpress SARS-CoV-2/FLU/RSV plus assay is intended as an aid in the diagnosis of influenza from Nasopharyngeal swab specimens and should not be used as a sole basis for treatment. Nasal washings and aspirates are unacceptable for Xpert Xpress SARS-CoV-2/FLU/RSV testing.  Fact Sheet for Patients: BloggerCourse.com  Fact Sheet for Healthcare Providers: SeriousBroker.it  This test is not yet approved or cleared by the [place] FDA and has been authorized for detection and/or diagnosis of SARS-CoV-2 by FDA under an Emergency Use Authorization (EUA). This EUA will remain in effect (meaning this test can be used) for the duration of the COVID-19 declaration under Section 564(b)(1) of the Act, 21 U.S.C. section 360bbb-3(b)(1), unless the authorization is terminated or revoked.     Resp Syncytial Virus by PCR NEGATIVE NEGATIVE Final    Comment: (NOTE) Fact Sheet for Patients: BloggerCourse.com  Fact Sheet for Healthcare Providers: SeriousBroker.it  This test is not yet approved or cleared by the [place] FDA and has been  authorized for detection and/or diagnosis of SARS-CoV-2 by FDA under an Emergency Use Authorization (EUA). This EUA will remain in effect (meaning this test can be used) for the duration of the COVID-19 declaration under Section 564(b)(1) of the Act, 21 U.S.C. section 360bbb-3(b)(1), unless the authorization is terminated or revoked.  Performed at [facility], [address] [place], [place] [address]   Culture, blood (Routine x 2)     Status: Abnormal   Collection Time: 01/09/24  4:56 PM   Specimen: BLOOD LEFT ARM  Result Value Ref Range Status   Specimen Description   Final    BLOOD LEFT ARM Performed at [facility], [address], [place], [place] [address]    Special Requests   Final    BOTTLES DRAWN AEROBIC AND ANAEROBIC Blood Culture results may not be optimal due to an inadequate volume of blood received in culture bottles Performed at [facility], [address], [place], [place] [address]    Culture  Setup Time   Final    GRAM POSITIVE COCCI IN CLUSTERS BOTTLES DRAWN AEROBIC ONLY CRITICAL RESULT CALLED TO, READ BACK BY AND VERIFIED WITH: J DANIELS MD 01/11/2024 @ 0009 BY AB GRAM POSITIVE COCCI IN CHAINS ANAEROBIC BOTTLE ONLY CRITICAL RESULT CALLED TO, READ BACK BY AND VERIFIED WITH: DR Gwendolyn Grant [id number] AT 801 BY CM Performed at [facility], [address], [place], [place] [address]    Culture (A)  Final    STAPHYLOCOCCUS AURICULARIS THE SIGNIFICANCE OF ISOLATING THIS ORGANISM FROM A SINGLE SET OF BLOOD CULTURES WHEN MULTIPLE SETS ARE DRAWN IS UNCERTAIN. PLEASE NOTIFY THE MICROBIOLOGY DEPARTMENT WITHIN ONE WEEK IF SPECIATION AND SENSITIVITIES ARE REQUIRED. ENTEROCOCCUS FAECALIS    Report Status 01/13/2024 FINAL  Final   Organism ID, Bacteria ENTEROCOCCUS FAECALIS  Final      Susceptibility   Enterococcus faecalis - MIC*    AMPICILLIN <=2 SENSITIVE Sensitive     VANCOMYCIN 1 SENSITIVE Sensitive      GENTAMICIN SYNERGY SENSITIVE Sensitive     * ENTEROCOCCUS FAECALIS  Blood Culture ID Panel (Reflexed)     Status: Abnormal   Collection Time: 01/09/24  4:56 PM  Result Value Ref Range  Status   Enterococcus faecalis NOT DETECTED NOT DETECTED Final   Enterococcus Faecium NOT DETECTED NOT DETECTED Final   Listeria monocytogenes NOT DETECTED NOT DETECTED Final   Staphylococcus species DETECTED (A) NOT DETECTED Final    Comment: CRITICAL RESULT CALLED TO, READ BACK BY AND VERIFIED WITH: J DANIELS MD 01/11/2024 @ 0009 BY AB    Staphylococcus aureus (BCID) NOT DETECTED NOT DETECTED Final   Staphylococcus epidermidis NOT DETECTED NOT DETECTED Final   Staphylococcus lugdunensis NOT DETECTED NOT DETECTED Final   Streptococcus species NOT DETECTED NOT DETECTED Final   Streptococcus agalactiae NOT DETECTED NOT DETECTED Final   Streptococcus pneumoniae NOT DETECTED NOT DETECTED Final   Streptococcus pyogenes NOT DETECTED NOT DETECTED Final   A.calcoaceticus-baumannii NOT DETECTED NOT DETECTED Final   Bacteroides fragilis NOT DETECTED NOT DETECTED Final   Enterobacterales NOT DETECTED NOT DETECTED Final   Enterobacter cloacae complex NOT DETECTED NOT DETECTED Final   Escherichia coli NOT DETECTED NOT DETECTED Final   Klebsiella aerogenes NOT DETECTED NOT DETECTED Final   Klebsiella oxytoca NOT DETECTED NOT DETECTED Final   Klebsiella pneumoniae NOT DETECTED NOT DETECTED Final   Proteus species NOT DETECTED NOT DETECTED Final   Salmonella species NOT DETECTED NOT DETECTED Final   Serratia marcescens NOT DETECTED NOT DETECTED Final   Haemophilus influenzae NOT DETECTED NOT DETECTED Final   Neisseria meningitidis NOT DETECTED NOT DETECTED Final   Pseudomonas aeruginosa NOT DETECTED NOT DETECTED Final   Stenotrophomonas maltophilia NOT DETECTED NOT DETECTED Final   Candida albicans NOT DETECTED NOT DETECTED Final   Candida auris NOT DETECTED NOT DETECTED Final   Candida glabrata NOT DETECTED NOT  DETECTED Final   Candida krusei NOT DETECTED NOT DETECTED Final   Candida parapsilosis NOT DETECTED NOT DETECTED Final   Candida tropicalis NOT DETECTED NOT DETECTED Final   Cryptococcus neoformans/gattii NOT DETECTED NOT DETECTED Final    Comment: Performed at [facility], [address] [place], [place] [address]  MRSA Next Gen by PCR, Nasal     Status: None   Collection Time: 01/10/24  1:07 PM   Specimen: Nasal Mucosa; Nasal Swab  Result Value Ref Range Status   MRSA by PCR Next Gen NOT DETECTED NOT DETECTED Final    Comment: (NOTE) The GeneXpert MRSA Assay (FDA approved for NASAL specimens only), is one component of a comprehensive MRSA colonization surveillance program. It is not intended to diagnose MRSA infection nor to guide or monitor treatment for MRSA infections. Test performance is not FDA approved in patients less than 68 years old. Performed at [facility], [address] [place], [place] [address]   Blood Culture ID Panel (Reflexed)     Status: Abnormal   Collection Time: 01/11/24  4:46 AM  Result Value Ref Range Status   Enterococcus faecalis DETECTED (A) NOT DETECTED Final    Comment: CRITICAL RESULT CALLED TO, READ BACK BY AND VERIFIED WITH: DR Gwendolyn Grant [phone] AT 0801 BY CM    Enterococcus Faecium NOT DETECTED NOT DETECTED Final   Listeria monocytogenes NOT DETECTED NOT DETECTED Final   Staphylococcus species NOT DETECTED NOT DETECTED Final   Staphylococcus aureus (BCID) NOT DETECTED NOT DETECTED Final   Staphylococcus epidermidis NOT DETECTED NOT DETECTED Final   Staphylococcus lugdunensis NOT DETECTED NOT DETECTED Final   Streptococcus species NOT DETECTED NOT DETECTED Final   Streptococcus agalactiae NOT DETECTED NOT DETECTED Final   Streptococcus pneumoniae NOT DETECTED NOT DETECTED Final   Streptococcus pyogenes NOT DETECTED NOT  DETECTED Final   A.calcoaceticus-baumannii NOT DETECTED NOT DETECTED Final   Bacteroides fragilis NOT  DETECTED NOT DETECTED Final   Enterobacterales NOT DETECTED NOT DETECTED Final   Enterobacter cloacae complex NOT DETECTED NOT DETECTED Final   Escherichia coli NOT DETECTED NOT DETECTED Final   Klebsiella aerogenes NOT DETECTED NOT DETECTED Final   Klebsiella oxytoca NOT DETECTED NOT DETECTED Final   Klebsiella pneumoniae NOT DETECTED NOT DETECTED Final   Proteus species NOT DETECTED NOT DETECTED Final   Salmonella species NOT DETECTED NOT DETECTED Final   Serratia marcescens NOT DETECTED NOT DETECTED Final   Haemophilus influenzae NOT DETECTED NOT DETECTED Final   Neisseria meningitidis NOT DETECTED NOT DETECTED Final   Pseudomonas aeruginosa NOT DETECTED NOT DETECTED Final   Stenotrophomonas maltophilia NOT DETECTED NOT DETECTED Final   Candida albicans NOT DETECTED NOT DETECTED Final   Candida auris NOT DETECTED NOT DETECTED Final   Candida glabrata NOT DETECTED NOT DETECTED Final   Candida krusei NOT DETECTED NOT DETECTED Final   Candida parapsilosis NOT DETECTED NOT DETECTED Final   Candida tropicalis NOT DETECTED NOT DETECTED Final   Cryptococcus neoformans/gattii NOT DETECTED NOT DETECTED Final   Vancomycin resistance NOT DETECTED NOT DETECTED Final    Comment: Performed at [facility], [address] [place], [place] [address]  Culture, blood (Routine X 2) w Reflex to ID Panel     Status: None (Preliminary result)   Collection Time: 01/12/24  1:57 AM   Specimen: BLOOD  Result Value Ref Range Status   Specimen Description   Final    BLOOD LEFT ANTECUBITAL Performed at [facility], [address], [place], [place] [address]    Special Requests   Final    BOTTLES DRAWN AEROBIC AND ANAEROBIC Blood Culture results may not be optimal due to an inadequate volume of blood received in culture bottles Performed at [facility], [address], [place], [place] [address]    Culture   Final    NO GROWTH 2 DAYS Performed at [facility], [address] [place], [place] [address]    Report Status PENDING  Incomplete  Culture, blood (Routine X 2) w Reflex to ID Panel     Status: None (Preliminary result)   Collection Time: 01/12/24  2:04 AM   Specimen: BLOOD  Result Value Ref Range Status   Specimen Description   Final    BLOOD BLOOD LEFT FOREARM Performed at [facility], [address] [place], [place] [address]    Special Requests   Final    BOTTLES DRAWN AEROBIC AND ANAEROBIC Blood Culture results may not be optimal due to an inadequate volume of blood received in culture bottles Performed at [facility], [address] [place], [place] [address]    Culture   Final    NO GROWTH 2 DAYS Performed at [facility], [address] [place], [place] [address]    Report Status PENDING  Incomplete     Radiology Studies: ECHOCARDIOGRAM COMPLETE Result Date: 01/13/2024    ECHOCARDIOGRAM REPORT   Patient Name:   SPECIAL GARTLEY Date of Exam: 01/13/2024 Medical Rec #:  [id number]       Height:       63.0 in Accession #:    [id number]      Weight:       130.1 lb Date of Birth:  05-01-1961      BSA:  1.611 m Patient Age:    62 years        BP:           149/66 mmHg Patient Gender: F               HR:           79 bpm. Exam Location:  Inpatient Procedure: 2D Echo, Cardiac Doppler and Color Doppler Indications:    Endocarditis  History:        Patient has prior history of Echocardiogram examinations, most                 recent 12/08/2020. Signs/Symptoms:Bacteremia; Risk                 Factors:Diabetes and Hypertension.  Sonographer:    Webb Laws Referring Phys: [id number] [facility] SINGH IMPRESSIONS  1. If risk of SBE is high clinically can consider TEE given sub optimal image quality of TTE.  2. Left ventricular ejection fraction, by estimation, is 60 to 65%. The left ventricle has normal function. The left ventricle has no regional wall motion abnormalities. There is  mild left ventricular hypertrophy. Left ventricular diastolic parameters were normal.  3. Right ventricular systolic function is normal. The right ventricular size is normal.  4. The mitral valve is abnormal. No evidence of mitral valve regurgitation. No evidence of mitral stenosis.  5. The aortic valve was not well visualized. There is mild calcification of the aortic valve. There is mild thickening of the aortic valve. Aortic valve regurgitation is trivial. Aortic valve sclerosis is present, with no evidence of aortic valve stenosis.  6. The inferior vena cava is normal in size with greater than 50% respiratory variability, suggesting right atrial pressure of 3 mmHg. FINDINGS  Left Ventricle: Left ventricular ejection fraction, by estimation, is 60 to 65%. The left ventricle has normal function. The left ventricle has no regional wall motion abnormalities. The left ventricular internal cavity size was normal in size. There is  mild left ventricular hypertrophy. Left ventricular diastolic parameters were normal. Right Ventricle: The right ventricular size is normal. No increase in right ventricular wall thickness. Right ventricular systolic function is normal. Left Atrium: Left atrial size was normal in size. Right Atrium: Right atrial size was normal in size. Pericardium: There is no evidence of pericardial effusion. Mitral Valve: The mitral valve is abnormal. There is mild thickening of the mitral valve leaflet(s). There is mild calcification of the mitral valve leaflet(s). Mild mitral annular calcification. No evidence of mitral valve regurgitation. No evidence of mitral valve stenosis. Tricuspid Valve: The tricuspid valve is not well visualized. Tricuspid valve regurgitation is not demonstrated. No evidence of tricuspid stenosis. Aortic Valve: The aortic valve was not well visualized. There is mild calcification of the aortic valve. There is mild thickening of the aortic valve. Aortic valve regurgitation is  trivial. Aortic regurgitation PHT measures 688 msec. Aortic valve sclerosis is present, with no evidence of aortic valve stenosis. Pulmonic Valve: The pulmonic valve was not well visualized. Pulmonic valve regurgitation is not visualized. No evidence of pulmonic stenosis. Aorta: The aortic root is normal in size and structure. Venous: The inferior vena cava is normal in size with greater than 50% respiratory variability, suggesting right atrial pressure of 3 mmHg. IAS/Shunts: No atrial level shunt detected by color flow Doppler. Additional Comments: If risk of SBE is high clinically can consider TEE given sub optimal image quality of TTE.  LEFT VENTRICLE PLAX 2D LVIDd:  3.40 cm     Diastology LVIDs:         2.30 cm     LV e' medial:    5.11 cm/s LV PW:         1.10 cm     LV E/e' medial:  15.1 LV IVS:        1.30 cm     LV e' lateral:   5.77 cm/s LVOT diam:     1.20 cm     LV E/e' lateral: 13.4 LV SV:         28 LV SV Index:   17 LVOT Area:     1.13 cm  LV Volumes (MOD) LV vol d, MOD A2C: 34.5 ml LV vol d, MOD A4C: 47.9 ml LV vol s, MOD A2C: 8.9 ml LV vol s, MOD A4C: 11.5 ml LV SV MOD A2C:     25.6 ml LV SV MOD A4C:     47.9 ml LV SV MOD BP:      31.1 ml RIGHT VENTRICLE             IVC RV Basal diam:  2.50 cm     IVC diam: 1.10 cm RV S prime:     17.50 cm/s TAPSE (M-mode): 2.4 cm LEFT ATRIUM             Index        RIGHT ATRIUM          Index LA diam:        3.00 cm 1.86 cm/m   RA Area:     7.21 cm LA Vol (A2C):   14.1 ml 8.75 ml/m   RA Volume:   8.51 ml  5.28 ml/m LA Vol (A4C):   20.5 ml 12.73 ml/m LA Biplane Vol: 17.7 ml 10.99 ml/m  AORTIC VALVE LVOT Vmax:   112.00 cm/s LVOT Vmean:  86.400 cm/s LVOT VTI:    0.249 m AI PHT:      688 msec  AORTA Ao Root diam: 2.90 cm Ao Asc diam:  2.90 cm MITRAL VALVE MV Area (PHT): 3.63 cm    SHUNTS MV Decel Time: 209 msec    Systemic VTI:  0.25 m MV E velocity: 77.10 cm/s  Systemic Diam: 1.20 cm MV A velocity: 84.50 cm/s MV E/A ratio:  0.91 Charlton Haws MD  Electronically signed by Charlton Haws MD Signature Date/Time: 01/13/2024/2:01:55 PM    Final      Pamella Pert, MD, PhD [facility]  Between 7 am - 7 pm I am available, please contact me via Amion (for emergencies) or Securechat (non urgent messages)  Between 7 pm - 7 am I am not available, please contact night coverage MD/APP via Amion

## 2024-01-15 DIAGNOSIS — R7881 Bacteremia: Secondary | ICD-10-CM | POA: Diagnosis not present

## 2024-01-15 LAB — GLUCOSE, CAPILLARY
Glucose-Capillary: 238 mg/dL — ABNORMAL HIGH (ref 70–99)
Glucose-Capillary: 207 mg/dL — ABNORMAL HIGH (ref 70–99)
Glucose-Capillary: 168 mg/dL — ABNORMAL HIGH (ref 70–99)
Glucose-Capillary: 232 mg/dL — ABNORMAL HIGH (ref 70–99)
Glucose-Capillary: 298 mg/dL — ABNORMAL HIGH (ref 70–99)

## 2024-01-15 LAB — BASIC METABOLIC PANEL
Anion gap: 9 (ref 5–15)
BUN: 54 mg/dL — ABNORMAL HIGH (ref 8–23)
CO2: 17 mmol/L — ABNORMAL LOW (ref 22–32)
Calcium: 9.1 mg/dL (ref 8.9–10.3)
Chloride: 114 mmol/L — ABNORMAL HIGH (ref 98–111)
Creatinine, Ser: 1.6 mg/dL — ABNORMAL HIGH (ref 0.44–1.00)
GFR, Estimated: 36 mL/min — ABNORMAL LOW (ref >60)
Glucose, Bld: 271 mg/dL — ABNORMAL HIGH (ref 70–99)
Potassium: 5.3 mmol/L — ABNORMAL HIGH (ref 3.5–5.1)
Sodium: 140 mmol/L (ref 135–145)

## 2024-01-15 MED ORDER — SODIUM ZIRCONIUM CYCLOSILICATE 10 G PO PACK
10.0000 g | PACK | Freq: Once | ORAL | Status: AC
Start: 2024-01-15 — End: 2024-01-15
  Administered 2024-01-15: 10 g via ORAL
  Filled 2024-01-15: qty 1

## 2024-01-15 MED ORDER — AMOXICILLIN 500 MG PO CAPS
1000.0000 mg | ORAL_CAPSULE | Freq: Three times a day (TID) | ORAL | Status: DC
  Administered 2024-01-15 – 2024-01-17 (×7): 1000 mg via ORAL
  Filled 2024-01-15 (×8): qty 2

## 2024-01-15 NOTE — Progress Notes (Signed)
PROGRESS NOTE  [name] WUJ:[id number] DOB: Nov 23, 1961 DOA: 01/11/2024 PCP: Stevphen Rochester, MD   LOS: 3 days   Brief Narrative / Interim history: 63 year old female with history of chronic indwelling Foley catheter, DM2, prior renal transplant with CKD 3A, hypothyroidism, right-sided BKA, chronic pressure ulcers, generalized deconditioning, peripheral neuropathy comes into the hospital after being called that her blood cultures were positive.  She was hospitalized 1/14-1/15 for sepsis due to UTI, catheter was changed and she was discharged with Bactrim.  Blood cultures came back positive for Enterococcus bacteremia and she was called to come back to the hospital.  No fever or chills, overall feeling good  Subjective / 24h Interval events: Overall feeling well, appetite is good.  No nausea or vomiting.  Assesement and Plan: Principal Problem:   Gram-positive bacteremia Active Problems:   History of renal transplant   CKD stage 3b, GFR 30-44 ml/min (HCC)   Hypothyroidism   Insulin dependent type 1 diabetes mellitus (HCC)   Chronic indwelling Foley catheter   Pressure ulcer   Peripheral neuropathy  Principal problem Enterococcus faecalis bacteremia -on blood cultures obtained 1/15.  Blood cultures also showed staph auricularis in 1/4 bottles.  She was told to come back mainly for the Enterococcus findings.  ID consulted, possibly a contaminant but not sure, plan to treat with antibiotics for 2 weeks.  Repeat cultures during this admission without growth -Continue ampicillin per infectious disease -2D echo 1/17 shows LVEF 60 to 65%, no WMA, normal RV and no evidence of endocarditis  Active problems Proteus UTI-noticed on the urine culture obtained 1/13, pansensitive except for nitrofurantoin.  Continue ampicillin.  Urinary catheter was exchanged at that time  Acute kidney injury chronic kidney disease stage IIIb -baseline creatinine ranging between 1.6-1.8, creatinine  increasing to 2.2 on 1/17.  Case was discussed with Dr. Marisue Humble with nephrology, this is likely to be Bactrim induced -Received fluids, creatinine now returned to baseline at 1.6 today.  Hyperkalemia-due to AKI, Lokelma x 1 today.  Repeat potassium tomorrow  History of renal transplant-continue home medications, discussed with nephrology, continue current suppressive regimen as is without changes  Hypothyroidism-continue Synthroid  Essential hypertension-hold home lisinopril  Chronic sacral pressure ulcer-wound care consult  Chronic indwelling Foley catheter-follows with urology as an outpatient in [place].  Foley has been changed 1/13  Anemia of chronic renal disease-hemoglobin stable, no bleeding  DM type I, with hyperglycemia-continue insulin pump  Lab Results  Component Value Date   HGBA1C 7.2 (H) 01/09/2024   CBG (last 3)  Recent Labs    01/14/24 2029 01/15/24 0340 01/15/24 0801  GLUCAP 222* 238* 207*    Scheduled Meds:  azaTHIOprine  50 mg Oral Daily   Chlorhexidine Gluconate Cloth  6 each Topical Daily   cycloSPORINE modified  75 mg Oral BID   heparin  5,000 Units Subcutaneous Q8H   insulin pump   Subcutaneous TID WC, HS, 0200   levothyroxine  100 mcg Oral Q0600   predniSONE  5 mg Oral Q breakfast   sodium bicarbonate  650 mg Oral TID   sodium chloride flush  3 mL Intravenous Q12H   sodium chloride flush  3 mL Intravenous Q12H   Continuous Infusions:  ampicillin (OMNIPEN) IV 2 g (01/15/24 0856)   PRN Meds:.acetaminophen **OR** acetaminophen, ondansetron **OR** ondansetron (ZOFRAN) IV, sodium chloride flush  Current Outpatient Medications  Medication Instructions   azaTHIOprine (IMURAN) 50 mg, Oral, Daily   cycloSPORINE modified (NEORAL) 75 mg, Oral, 2 times daily  levothyroxine (SYNTHROID) 100 mcg, Oral, Daily before breakfast   lisinopril (ZESTRIL) 5 mg, Oral, Daily at bedtime   NovoLOG 100 Units, Subcutaneous, See admin instructions, Per Omnipod  insulin pump    predniSONE (DELTASONE) 5 mg, Oral, Daily with breakfast   sucralfate (CARAFATE) 1 g, Oral, 3 times daily with meals & bedtime   sulfamethoxazole-trimethoprim (BACTRIM DS) 800-160 MG tablet 1 tablet, Oral, 2 times daily    Diet Orders (From admission, onward)     Start     Ordered   01/12/24 0125  Diet heart healthy/carb modified Room service appropriate? Yes; Fluid consistency: Thin  Diet effective now       Question Answer Comment  Diet-HS Snack? Nothing   Room service appropriate? Yes   Fluid consistency: Thin      01/12/24 0124            DVT prophylaxis: heparin injection 5,000 Units Start: 01/12/24 0600 SCDs Start: 01/12/24 0125 Place TED hose Start: 01/12/24 0125   Lab Results  Component Value Date   PLT 158 01/14/2024      Code Status: Limited: Do not attempt resuscitation (DNR) -DNR-LIMITED -Do Not Intubate/DNI   Family Communication: No family at bedside  Status is: Inpatient Remains inpatient appropriate because: Severity of illness   Level of care: Med-Surg  Consultants:  ID  Objective: Vitals:   01/14/24 0538 01/14/24 1528 01/14/24 2031 01/15/24 0635  BP: 128/65 (!) 141/68 (!) 144/70 (!) 155/84  Pulse: 67 68 68 64  Resp: 16 16 16 14   Temp: 98.3 F (36.8 C) 98.7 F (37.1 C) 98.3 F (36.8 C) 98 F (36.7 C)  TempSrc:  Oral Oral   SpO2: 97% 99% 97% 96%  Weight:      Height:        Intake/Output Summary (Last 24 hours) at 01/15/2024 0953 Last data filed at 01/15/2024 0600 Gross per 24 hour  Intake 360 ml  Output 1150 ml  Net -790 ml   Wt Readings from Last 3 Encounters:  01/11/24 59 kg  01/09/24 59 kg  12/12/23 59 kg    Examination:  Constitutional: NAD Eyes: lids and conjunctivae normal, no scleral icterus ENMT: mmm Neck: normal, supple Respiratory: clear to auscultation bilaterally, no wheezing, no crackles. Normal respiratory effort.  Cardiovascular: Regular rate and rhythm, no murmurs / rubs / gallops. No LE  edema. Abdomen: soft, no distention, no tenderness. Bowel sounds positive.   Data Reviewed: I have independently reviewed following labs and imaging studies   CBC Recent Labs  Lab 01/09/24 1553 01/10/24 0520 01/11/24 1756 01/12/24 0204 01/13/24 0357 01/14/24 0411  WBC 9.9 9.7 7.0 6.6 5.8 5.0  HGB 10.5* 8.5* 9.6* 9.3* 8.0* 8.2*  HCT 34.2* 27.6* 31.0* 30.4* 25.6* 27.1*  PLT 193 155 171 192 163 158  MCV 107.5* 107.8* 106.5* 107.0* 104.9* 107.5*  MCH 33.0 33.2 33.0 32.7 32.8 32.5  MCHC 30.7 30.8 31.0 30.6 31.3 30.3  RDW 14.7 14.8 14.5 14.6 14.6 14.2  LYMPHSABS 1.5  --  0.8  --   --   --   MONOABS 0.8  --  0.3  --   --   --   EOSABS 0.2  --  0.0  --   --   --   BASOSABS 0.0  --  0.0  --   --   --     Recent Labs  Lab  0000 01/09/24 1549 01/09/24 1553 01/09/24 1755 01/09/24 1800 01/09/24 2258 01/10/24 0520 01/10/24 1409 01/11/24 1756  01/12/24 0204 01/13/24 0357 01/14/24 0411 01/15/24 0337  NA   < >  --  135  --   --   --  138   < > 134* 132* 136 134* 140  K   < >  --  5.4*  --   --   --  4.7   < > 4.8 4.5 4.7 4.9 5.3*  CL   < >  --  111  --   --   --  114*   < > 108 106 110 108 114*  CO2   < >  --  19*  --   --   --  19*   < > 17* 17* 19* 22 17*  GLUCOSE   < >  --  94  --   --   --  166*   < > 342* 261* 270* 299* 271*  BUN   < >  --  61*  --   --   --  52*   < > 52* 50* 53* 55* 54*  CREATININE  --   --  1.73*  --    < >  --  1.80*   < > 1.66* 1.78* 2.26* 1.99* 1.60*  CALCIUM   < >  --  9.0  --   --   --  8.6*   < > 9.2 8.8* 8.8* 8.7* 9.1  AST  --   --  30  --   --   --   --   --  17 15 10*  --   --   ALT  --   --  16  --   --   --   --   --  14 12 10   --   --   ALKPHOS  --   --  78  --   --   --   --   --  71 69 54  --   --   BILITOT  --   --  0.8  --   --   --   --   --  0.6 0.3 0.4  --   --   ALBUMIN  --   --  3.4*  --   --   --   --   --  3.2* 2.9* 2.4*  --   --   MG  --   --   --   --   --   --   --   --   --   --  1.8  --   --   LATICACIDVEN  --  0.9  --  1.5   --   --   --   --   --   --   --   --   --   INR  --   --  1.0  --   --   --  1.2  --   --   --   --   --   --   HGBA1C  --   --   --   --   --  7.2*  --   --   --   --   --   --   --    < > = values in this interval not displayed.    ------------------------------------------------------------------------------------------------------------------ No results for input(s): "CHOL", "HDL", "LDLCALC", "TRIG", "CHOLHDL", "LDLDIRECT" in the last 72 hours.  Lab Results  Component Value Date   HGBA1C 7.2 (H)  01/09/2024   ------------------------------------------------------------------------------------------------------------------ No results for input(s): "TSH", "T4TOTAL", "T3FREE", "THYROIDAB" in the last 72 hours.  Invalid input(s): "FREET3"  Cardiac Enzymes No results for input(s): "CKMB", "TROPONINI", "MYOGLOBIN" in the last 168 hours.  Invalid input(s): "CK" ------------------------------------------------------------------------------------------------------------------ No results found for: "BNP"  CBG: Recent Labs  Lab 01/14/24 1219 01/14/24 1702 01/14/24 2029 01/15/24 0340 01/15/24 0801  GLUCAP 141* 250* 222* 238* 207*    Recent Results (from the past 240 hours)  Culture, blood (Routine x 2)     Status: None   Collection Time: 01/09/24  3:45 PM   Specimen: BLOOD  Result Value Ref Range Status   Specimen Description   Final    BLOOD SITE NOT SPECIFIED Performed at [facility], [address] [place], [place] [address]    Special Requests   Final    BOTTLES DRAWN AEROBIC AND ANAEROBIC Blood Culture results may not be optimal due to an inadequate volume of blood received in culture bottles Performed at [facility], [address], [place], [place] [address]    Culture   Final    NO GROWTH 5 DAYS Performed at [facility], [address], [place], [place] [address]    Report Status 01/14/2024 FINAL  Final  Urine  Culture     Status: Abnormal   Collection Time: 01/09/24  3:47 PM   Specimen: Urine, Random  Result Value Ref Range Status   Specimen Description   Final    URINE, RANDOM Performed at [facility], [address], [place], [place] [address]    Special Requests   Final    NONE Reflexed from [id number] Performed at [facility], [address], [place], [place] [address]    Culture >=100,000 COLONIES/mL PROTEUS MIRABILIS (A)  Final   Report Status 01/11/2024 FINAL  Final   Organism ID, Bacteria PROTEUS MIRABILIS (A)  Final      Susceptibility   Proteus mirabilis - MIC*    AMPICILLIN <=2 SENSITIVE Sensitive     CEFAZOLIN <=4 SENSITIVE Sensitive     CEFEPIME <=0.12 SENSITIVE Sensitive     CEFTRIAXONE <=0.25 SENSITIVE Sensitive     CIPROFLOXACIN <=0.25 SENSITIVE Sensitive     GENTAMICIN <=1 SENSITIVE Sensitive     IMIPENEM 2 SENSITIVE Sensitive     NITROFURANTOIN 128 RESISTANT Resistant     TRIMETH/SULFA <=20 SENSITIVE Sensitive     AMPICILLIN/SULBACTAM <=2 SENSITIVE Sensitive     PIP/TAZO <=4 SENSITIVE Sensitive ug/mL    * >=100,000 COLONIES/mL PROTEUS MIRABILIS  Resp panel by RT-PCR (RSV, Flu A&B, Covid) Anterior Nasal Swab     Status: None   Collection Time: 01/09/24  4:45 PM   Specimen: Anterior Nasal Swab  Result Value Ref Range Status   SARS Coronavirus 2 by RT PCR NEGATIVE NEGATIVE Final    Comment: (NOTE) SARS-CoV-2 target nucleic acids are NOT DETECTED.  The SARS-CoV-2 RNA is generally detectable in upper respiratory specimens during the acute phase of infection. The lowest concentration of SARS-CoV-2 viral copies this assay can detect is 138 copies/mL. A negative result does not preclude SARS-Cov-2 infection and should not be used as the sole basis for treatment or other patient management decisions. A negative result may occur with  improper specimen collection/handling, submission of specimen other than nasopharyngeal swab, presence  of viral mutation(s) within the areas targeted by this assay, and inadequate number of viral copies(<138 copies/mL). A negative result must be combined with clinical observations, patient  history, and epidemiological information. The expected result is Negative.  Fact Sheet for Patients:  BloggerCourse.com  Fact Sheet for Healthcare Providers:  SeriousBroker.it  This test is no t yet approved or cleared by the [place] FDA and  has been authorized for detection and/or diagnosis of SARS-CoV-2 by FDA under an Emergency Use Authorization (EUA). This EUA will remain  in effect (meaning this test can be used) for the duration of the COVID-19 declaration under Section 564(b)(1) of the Act, 21 U.S.C.section 360bbb-3(b)(1), unless the authorization is terminated  or revoked sooner.       Influenza A by PCR NEGATIVE NEGATIVE Final   Influenza B by PCR NEGATIVE NEGATIVE Final    Comment: (NOTE) The Xpert Xpress SARS-CoV-2/FLU/RSV plus assay is intended as an aid in the diagnosis of influenza from Nasopharyngeal swab specimens and should not be used as a sole basis for treatment. Nasal washings and aspirates are unacceptable for Xpert Xpress SARS-CoV-2/FLU/RSV testing.  Fact Sheet for Patients: BloggerCourse.com  Fact Sheet for Healthcare Providers: SeriousBroker.it  This test is not yet approved or cleared by the [place] FDA and has been authorized for detection and/or diagnosis of SARS-CoV-2 by FDA under an Emergency Use Authorization (EUA). This EUA will remain in effect (meaning this test can be used) for the duration of the COVID-19 declaration under Section 564(b)(1) of the Act, 21 U.S.C. section 360bbb-3(b)(1), unless the authorization is terminated or revoked.     Resp Syncytial Virus by PCR NEGATIVE NEGATIVE Final    Comment: (NOTE) Fact Sheet for  Patients: BloggerCourse.com  Fact Sheet for Healthcare Providers: SeriousBroker.it  This test is not yet approved or cleared by the [place] FDA and has been authorized for detection and/or diagnosis of SARS-CoV-2 by FDA under an Emergency Use Authorization (EUA). This EUA will remain in effect (meaning this test can be used) for the duration of the COVID-19 declaration under Section 564(b)(1) of the Act, 21 U.S.C. section 360bbb-3(b)(1), unless the authorization is terminated or revoked.  Performed at [facility], [address], [place], [place] [address]   Culture, blood (Routine x 2)     Status: Abnormal   Collection Time: 01/09/24  4:56 PM   Specimen: BLOOD LEFT ARM  Result Value Ref Range Status   Specimen Description   Final    BLOOD LEFT ARM Performed at [facility], [address], [place], [place] [address]    Special Requests   Final    BOTTLES DRAWN AEROBIC AND ANAEROBIC Blood Culture results may not be optimal due to an inadequate volume of blood received in culture bottles Performed at [facility], [address] [place], [place] [address]    Culture  Setup Time   Final    GRAM POSITIVE COCCI IN CLUSTERS BOTTLES DRAWN AEROBIC ONLY CRITICAL RESULT CALLED TO, READ BACK BY AND VERIFIED WITH: J DANIELS MD 01/11/2024 @ 0009 BY AB GRAM POSITIVE COCCI IN CHAINS ANAEROBIC BOTTLE ONLY CRITICAL RESULT CALLED TO, READ BACK BY AND VERIFIED WITH: DR Gwendolyn Grant [id number] AT 801 BY CM Performed at [facility], [address], [place], [place] [address]    Culture (A)  Final    STAPHYLOCOCCUS AURICULARIS THE SIGNIFICANCE OF ISOLATING THIS ORGANISM FROM A SINGLE SET OF BLOOD CULTURES WHEN MULTIPLE SETS ARE DRAWN IS UNCERTAIN. PLEASE NOTIFY THE MICROBIOLOGY DEPARTMENT WITHIN ONE WEEK IF SPECIATION AND SENSITIVITIES ARE REQUIRED. ENTEROCOCCUS FAECALIS    Report  Status 01/13/2024 FINAL  Final  Organism ID, Bacteria ENTEROCOCCUS FAECALIS  Final      Susceptibility   Enterococcus faecalis - MIC*    AMPICILLIN <=2 SENSITIVE Sensitive     VANCOMYCIN 1 SENSITIVE Sensitive     GENTAMICIN SYNERGY SENSITIVE Sensitive     * ENTEROCOCCUS FAECALIS  Blood Culture ID Panel (Reflexed)     Status: Abnormal   Collection Time: 01/09/24  4:56 PM  Result Value Ref Range Status   Enterococcus faecalis NOT DETECTED NOT DETECTED Final   Enterococcus Faecium NOT DETECTED NOT DETECTED Final   Listeria monocytogenes NOT DETECTED NOT DETECTED Final   Staphylococcus species DETECTED (A) NOT DETECTED Final    Comment: CRITICAL RESULT CALLED TO, READ BACK BY AND VERIFIED WITH: J DANIELS MD 01/11/2024 @ 0009 BY AB    Staphylococcus aureus (BCID) NOT DETECTED NOT DETECTED Final   Staphylococcus epidermidis NOT DETECTED NOT DETECTED Final   Staphylococcus lugdunensis NOT DETECTED NOT DETECTED Final   Streptococcus species NOT DETECTED NOT DETECTED Final   Streptococcus agalactiae NOT DETECTED NOT DETECTED Final   Streptococcus pneumoniae NOT DETECTED NOT DETECTED Final   Streptococcus pyogenes NOT DETECTED NOT DETECTED Final   A.calcoaceticus-baumannii NOT DETECTED NOT DETECTED Final   Bacteroides fragilis NOT DETECTED NOT DETECTED Final   Enterobacterales NOT DETECTED NOT DETECTED Final   Enterobacter cloacae complex NOT DETECTED NOT DETECTED Final   Escherichia coli NOT DETECTED NOT DETECTED Final   Klebsiella aerogenes NOT DETECTED NOT DETECTED Final   Klebsiella oxytoca NOT DETECTED NOT DETECTED Final   Klebsiella pneumoniae NOT DETECTED NOT DETECTED Final   Proteus species NOT DETECTED NOT DETECTED Final   Salmonella species NOT DETECTED NOT DETECTED Final   Serratia marcescens NOT DETECTED NOT DETECTED Final   Haemophilus influenzae NOT DETECTED NOT DETECTED Final   Neisseria meningitidis NOT DETECTED NOT DETECTED Final   Pseudomonas aeruginosa NOT DETECTED NOT  DETECTED Final   Stenotrophomonas maltophilia NOT DETECTED NOT DETECTED Final   Candida albicans NOT DETECTED NOT DETECTED Final   Candida auris NOT DETECTED NOT DETECTED Final   Candida glabrata NOT DETECTED NOT DETECTED Final   Candida krusei NOT DETECTED NOT DETECTED Final   Candida parapsilosis NOT DETECTED NOT DETECTED Final   Candida tropicalis NOT DETECTED NOT DETECTED Final   Cryptococcus neoformans/gattii NOT DETECTED NOT DETECTED Final    Comment: Performed at [facility], [address] [place], [place] [address]  MRSA Next Gen by PCR, Nasal     Status: None   Collection Time: 01/10/24  1:07 PM   Specimen: Nasal Mucosa; Nasal Swab  Result Value Ref Range Status   MRSA by PCR Next Gen NOT DETECTED NOT DETECTED Final    Comment: (NOTE) The GeneXpert MRSA Assay (FDA approved for NASAL specimens only), is one component of a comprehensive MRSA colonization surveillance program. It is not intended to diagnose MRSA infection nor to guide or monitor treatment for MRSA infections. Test performance is not FDA approved in patients less than 69 years old. Performed at [facility], [address], [place], [place] [address]   Blood Culture ID Panel (Reflexed)     Status: Abnormal   Collection Time: 01/11/24  4:46 AM  Result Value Ref Range Status   Enterococcus faecalis DETECTED (A) NOT DETECTED Final    Comment: CRITICAL RESULT CALLED TO, READ BACK BY AND VERIFIED WITH: DR Gwendolyn Grant [id number] AT 0801 BY CM    Enterococcus Faecium NOT DETECTED NOT DETECTED Final   Listeria monocytogenes NOT DETECTED  NOT DETECTED Final   Staphylococcus species NOT DETECTED NOT DETECTED Final   Staphylococcus aureus (BCID) NOT DETECTED NOT DETECTED Final   Staphylococcus epidermidis NOT DETECTED NOT DETECTED Final   Staphylococcus lugdunensis NOT DETECTED NOT DETECTED Final   Streptococcus species NOT DETECTED NOT DETECTED Final   Streptococcus agalactiae NOT DETECTED NOT  DETECTED Final   Streptococcus pneumoniae NOT DETECTED NOT DETECTED Final   Streptococcus pyogenes NOT DETECTED NOT DETECTED Final   A.calcoaceticus-baumannii NOT DETECTED NOT DETECTED Final   Bacteroides fragilis NOT DETECTED NOT DETECTED Final   Enterobacterales NOT DETECTED NOT DETECTED Final   Enterobacter cloacae complex NOT DETECTED NOT DETECTED Final   Escherichia coli NOT DETECTED NOT DETECTED Final   Klebsiella aerogenes NOT DETECTED NOT DETECTED Final   Klebsiella oxytoca NOT DETECTED NOT DETECTED Final   Klebsiella pneumoniae NOT DETECTED NOT DETECTED Final   Proteus species NOT DETECTED NOT DETECTED Final   Salmonella species NOT DETECTED NOT DETECTED Final   Serratia marcescens NOT DETECTED NOT DETECTED Final   Haemophilus influenzae NOT DETECTED NOT DETECTED Final   Neisseria meningitidis NOT DETECTED NOT DETECTED Final   Pseudomonas aeruginosa NOT DETECTED NOT DETECTED Final   Stenotrophomonas maltophilia NOT DETECTED NOT DETECTED Final   Candida albicans NOT DETECTED NOT DETECTED Final   Candida auris NOT DETECTED NOT DETECTED Final   Candida glabrata NOT DETECTED NOT DETECTED Final   Candida krusei NOT DETECTED NOT DETECTED Final   Candida parapsilosis NOT DETECTED NOT DETECTED Final   Candida tropicalis NOT DETECTED NOT DETECTED Final   Cryptococcus neoformans/gattii NOT DETECTED NOT DETECTED Final   Vancomycin resistance NOT DETECTED NOT DETECTED Final    Comment: Performed at [facility], [address], [place], [place] [address]  Culture, blood (Routine X 2) w Reflex to ID Panel     Status: None (Preliminary result)   Collection Time: 01/12/24  1:57 AM   Specimen: BLOOD  Result Value Ref Range Status   Specimen Description   Final    BLOOD LEFT ANTECUBITAL Performed at [facility], [address] [place], [place] [address]    Special Requests   Final    BOTTLES DRAWN AEROBIC AND ANAEROBIC Blood Culture results may not be optimal  due to an inadequate volume of blood received in culture bottles Performed at [facility], [address], [place], [place] [address]    Culture   Final    NO GROWTH 3 DAYS Performed at [facility], [address] [place], [place] [address]    Report Status PENDING  Incomplete  Culture, blood (Routine X 2) w Reflex to ID Panel     Status: None (Preliminary result)   Collection Time: 01/12/24  2:04 AM   Specimen: BLOOD  Result Value Ref Range Status   Specimen Description   Final    BLOOD BLOOD LEFT FOREARM Performed at [facility], [address], [place], [place] [address]    Special Requests   Final    BOTTLES DRAWN AEROBIC AND ANAEROBIC Blood Culture results may not be optimal due to an inadequate volume of blood received in culture bottles Performed at [facility], [address], [place], [place] [address]    Culture   Final    NO GROWTH 3 DAYS Performed at [facility], [address], [place], [place] [address]    Report Status PENDING  Incomplete     Radiology Studies: No results  found.    Pamella Pert, MD, PhD [facility]  Between 7 am - 7 pm I am available, please contact me via Amion (for emergencies) or Securechat (non urgent messages)  Between 7 pm - 7 am I am not available, please contact night coverage MD/APP via Amion

## 2024-01-15 NOTE — Plan of Care (Signed)

## 2024-01-15 NOTE — Progress Notes (Addendum)
Id brief note   Afebrile Doing well on abx Repeat bcx negative   Labs: Lab Results  Component Value Date   WBC 5.0 01/14/2024   HGB 8.2 (L) 01/14/2024   HCT 27.1 (L) 01/14/2024   MCV 107.5 (H) 01/14/2024   PLT 158 01/14/2024   Last metabolic panel Lab Results  Component Value Date   GLUCOSE 271 (H) 01/15/2024   NA 140 01/15/2024   K 5.3 (H) 01/15/2024   CL 114 (H) 01/15/2024   CO2 17 (L) 01/15/2024   BUN 54 (H) 01/15/2024   CREATININE 1.60 (H) 01/15/2024   GFRNONAA 36 (L) 01/15/2024   CALCIUM 9.1 01/15/2024   PHOS 3.5 01/13/2024   PROT 5.3 (L) 01/13/2024   ALBUMIN 2.4 (L) 01/13/2024   BILITOT 0.4 01/13/2024   ALKPHOS 54 01/13/2024   AST 10 (L) 01/13/2024   ALT 10 01/13/2024   ANIONGAP 9 01/15/2024   Imaging: Reviewed tte no valve vegetation  Micro: 1/16 repeat bcx negative   A/p E faecalis bsi Proteus uti   -uti already treated -stop iv ampicillin; start amoxicillin 1 gram tid for 14 days from 1/16 until 1/29 -ok to discharge from id standpoint -id clinic f/u 1/28 with dr Thedore Mins -id will sign off -discussed with dr Elvera Lennox   I spent more than 15 minute reviewing data/chart, and coordinating care, discussing diagnostics/treatment plan with treatment team

## 2024-01-16 ENCOUNTER — Inpatient Hospital Stay (HOSPITAL_COMMUNITY): Payer: BC Managed Care – PPO

## 2024-01-16 ENCOUNTER — Encounter (HOSPITAL_COMMUNITY): Payer: Self-pay | Admitting: Internal Medicine

## 2024-01-16 DIAGNOSIS — R7881 Bacteremia: Secondary | ICD-10-CM | POA: Diagnosis not present

## 2024-01-16 LAB — GLUCOSE, CAPILLARY
Glucose-Capillary: 301 mg/dL — ABNORMAL HIGH (ref 70–99)
Glucose-Capillary: 271 mg/dL — ABNORMAL HIGH (ref 70–99)
Glucose-Capillary: 367 mg/dL — ABNORMAL HIGH (ref 70–99)
Glucose-Capillary: 321 mg/dL — ABNORMAL HIGH (ref 70–99)
Glucose-Capillary: 226 mg/dL — ABNORMAL HIGH (ref 70–99)

## 2024-01-16 LAB — BASIC METABOLIC PANEL
Anion gap: 8 (ref 5–15)
BUN: 63 mg/dL — ABNORMAL HIGH (ref 8–23)
CO2: 20 mmol/L — ABNORMAL LOW (ref 22–32)
Calcium: 8.8 mg/dL — ABNORMAL LOW (ref 8.9–10.3)
Chloride: 108 mmol/L (ref 98–111)
Creatinine, Ser: 2.23 mg/dL — ABNORMAL HIGH (ref 0.44–1.00)
GFR, Estimated: 24 mL/min — ABNORMAL LOW (ref >60)
Glucose, Bld: 308 mg/dL — ABNORMAL HIGH (ref 70–99)
Potassium: 4.7 mmol/L (ref 3.5–5.1)
Sodium: 136 mmol/L (ref 135–145)

## 2024-01-16 MED ORDER — INSULIN GLARGINE-YFGN 100 UNIT/ML ~~LOC~~ SOLN
6.0000 [IU] | Freq: Every day | SUBCUTANEOUS | Status: DC
  Administered 2024-01-16 – 2024-01-17 (×2): 6 [IU] via SUBCUTANEOUS
  Filled 2024-01-16 (×2): qty 0.06

## 2024-01-16 MED ORDER — LACTATED RINGERS IV BOLUS
2500.0000 mL | Freq: Once | INTRAVENOUS | Status: AC
  Administered 2024-01-16: 2500 mL via INTRAVENOUS

## 2024-01-16 MED ORDER — INSULIN ASPART 100 UNIT/ML IJ SOLN
0.0000 [IU] | Freq: Three times a day (TID) | INTRAMUSCULAR | Status: DC
  Administered 2024-01-16: 4 [IU] via SUBCUTANEOUS
  Administered 2024-01-17 (×2): 1 [IU] via SUBCUTANEOUS

## 2024-01-16 MED ORDER — LACTATED RINGERS IV SOLN: INTRAVENOUS | Status: DC

## 2024-01-16 MED ORDER — INSULIN ASPART 100 UNIT/ML IJ SOLN
2.0000 [IU] | Freq: Three times a day (TID) | INTRAMUSCULAR | Status: DC
  Administered 2024-01-16 – 2024-01-17 (×3): 2 [IU] via SUBCUTANEOUS

## 2024-01-16 NOTE — Plan of Care (Signed)
  Problem: Coping: Goal: Level of anxiety will decrease Outcome: Progressing   Problem: Pain Managment: Goal: General experience of comfort will improve and/or be controlled Outcome: Progressing   Problem: Safety: Goal: Ability to remain free from injury will improve Outcome: Progressing

## 2024-01-16 NOTE — TOC Initial Note (Signed)
Transition of Care [facility]) - Initial/Assessment Note    Patient Details  Name: [name] MRN: [id number] Date of Birth: 1961/02/12  Transition of [facility] CM/SW Contact:    Howell Rucks, RN Phone Number: 01/16/2024, 12:41 PM  Clinical Narrative:    Met with pt at bedside to introduce role of TOC/NCM and review for dc planning, pt confirms she has an established PCP and pharmacy, no current home care services, home DME: hospital bed, wheelchair, cane). Per MD notes, pt ha a chronic foley, pt reports she resides with her spouse and feels safe returning home. TOC will continue to follow.               Expected Discharge Plan: Home/Self Care Barriers to Discharge: Continued Medical Work up   Patient Goals and CMS Choice Patient states their goals for this hospitalization and ongoing recovery are:: return home          Expected Discharge Plan and Services                                              Prior Living Arrangements/Services   Lives with:: Spouse Patient language and need for interpreter reviewed:: Yes Do you feel safe going back to the place where you live?: Yes      Need for Family Participation in Patient Care: Yes (Comment)   Current home services: DME (Hospital bed, wheelchair, cane) Criminal Activity/Legal Involvement Pertinent to Current Situation/Hospitalization: No - Comment as needed  Activities of Daily Living   ADL Screening (condition at time of admission) Independently performs ADLs?: No Does the patient have a NEW difficulty with bathing/dressing/toileting/self-feeding that is expected to last >3 days?: No Does the patient have a NEW difficulty with getting in/out of bed, walking, or climbing stairs that is expected to last >3 days?: No Does the patient have a NEW difficulty with communication that is expected to last >3 days?: No Is the patient deaf or have difficulty hearing?: No Does the patient have difficulty seeing, even  when wearing glasses/contacts?: No Does the patient have difficulty concentrating, remembering, or making decisions?: No  Permission Sought/Granted                  Emotional Assessment Appearance:: Appears stated age Attitude/Demeanor/Rapport: Gracious Affect (typically observed): Accepting Orientation: : Oriented to Self, Oriented to Place, Oriented to  Time, Oriented to Situation Alcohol / Substance Use: Not Applicable Psych Involvement: No (comment)  Admission diagnosis:  Bacteremia [R78.81] Gram-positive bacteremia [R78.81] Patient Active Problem List   Diagnosis Date Noted   Gram-positive bacteremia 01/12/2024   Peripheral neuropathy 01/12/2024   Sepsis secondary to UTI (HCC) 01/09/2024   Pressure ulcer 01/09/2024   Acute cystitis without hematuria    Acute pyelonephritis    AKI (acute kidney injury) (HCC) 12/18/2021   Chronic indwelling Foley catheter 12/17/2021   S/P BKA (below knee amputation) unilateral, right (HCC) 12/17/2021   DNR (do not resuscitate)/DNI(Do Not Intubate) 12/17/2021   Chronic osteomyelitis of sacrum (HCC) 08/25/2021   Anemia 02/25/2021   Goals of care, counseling/discussion    Palliative care by specialist    Malnutrition of moderate degree 01/07/2021   Failure to thrive in adult    Unspecified severe protein-calorie malnutrition (HCC) 12/10/2020   Sacral decubitus ulcer, stage IV (HCC) 12/08/2020   Erosive esophagitis 12/08/2020   Hypothyroidism 12/08/2020   Depression  12/08/2020   Insulin dependent type 1 diabetes mellitus (HCC) 12/08/2020   Hypoalbuminemia 12/07/2020   Coffee ground emesis    Gastroesophageal reflux disease with esophagitis and hemorrhage    Long-term use of immunosuppressant medication    Type 1 diabetes mellitus with complication, with long term current use of insulin pump (HCC) 09/27/2020   Pressure ulcer of BKA stump (HCC) 09/27/2020   Anemia secondary to renal failure 05/14/2020   History of renal transplant  05/14/2020   CKD stage 3b, GFR 30-44 ml/min (HCC) 05/14/2020   Hypertension 05/14/2020   PCP:  Stevphen Rochester, MD Pharmacy:   [id number] [place], [place] - [address] [place] [address] Phone: [phone] Fax: [phone]     Social Drivers of Health (SDOH) Social History: SDOH Screenings   Food Insecurity: No Food Insecurity (01/12/2024)  Housing: Low Risk  (01/12/2024)  Transportation Needs: No Transportation Needs (01/12/2024)  Utilities: Not At Risk (01/12/2024)  Depression (PHQ2-9): Low Risk  (08/25/2021)  Financial Resource Strain: Low Risk  (05/30/2023)   Received from [facility], [facility]  Physical Activity: Unknown (03/30/2023)   Received from [facility], [facility]  Social Connections: Moderately Isolated (01/10/2024)  Stress: No Stress Concern Present (03/30/2023)   Received from [facility], [facility]  Tobacco Use: Low Risk  (01/12/2024)   SDOH Interventions:     Readmission Risk Interventions    01/16/2024   12:39 PM 12/21/2021    1:18 PM  Readmission Risk Prevention Plan  Transportation Screening Complete Complete  PCP or Specialist Appt within 3-5 Days Complete   HRI or Home Care Consult Complete Complete  Social Work Consult for Recovery Care Planning/Counseling Complete Complete  Palliative Care Screening Not Applicable Not Applicable  Medication Review Oceanographer) Complete Complete

## 2024-01-16 NOTE — Progress Notes (Signed)
Patient had been sleeping well tonight, awakened to check blood sugar, no needs at this time, will continue to monitor.

## 2024-01-16 NOTE — Inpatient Diabetes Management (Addendum)
Inpatient Diabetes Program Recommendations  AACE/ADA: New Consensus Statement on Inpatient Glycemic Control (2015)  Target Ranges:  Prepandial:   less than 140 mg/dL      Peak postprandial:   less than 180 mg/dL (1-2 hours)      Critically ill patients:  140 - 180 mg/dL    Latest Reference Range & Units 01/09/24 22:58  Hemoglobin A1C 4.8 - 5.6 % 7.2 (H)  (H): Data is abnormally high  Latest Reference Range & Units 01/15/24 03:40 01/15/24 08:01 01/15/24 11:57 01/15/24 16:40 01/15/24 21:51  Glucose-Capillary 70 - 99 mg/dL 956 (H)  2.13 units 086 (H)  2.6 units @1004  168 (H)  2.6 units 232 (H)  1.6 units 298 (H)  3.45 units    Latest Reference Range & Units 01/16/24 01:59 01/16/24 07:26 01/16/24 11:56  Glucose-Capillary 70 - 99 mg/dL 578 (H) 469 (H) 629 (H)  (H): Data is abnormally high  Admit with: Enterococcus faecalis bacteremia   History: Type 1 Diabetes, CKD  Home DM Meds: OmniPod insulin pump with Dexcom G6      Uses Humalog      If Pump not on, Lantus 14 daily   Current Orders: Insulin Pump      Prednisone 5 mg daily   MD- Met w/ pt at bedside this afternoon.  She is very worried about her CBGs and asked me if we can give her insulin in the hospital until she goes home. Her Insulin pod is due to expire this evening and she doesn't have her pump supplies.     Could we switch her to Basal/Bolus in the hospital?   If so, recommend the following based on her pump settings and weight:   --Semglee 8 units Daily (0.15 units/kg)--Her current basal rate on her pump is 0.25 units which gives her 6 units total over 24 hour period (she just self adjusted her basal rate this AM). Pt will need the Semglee this afternoon before she removes her insulin pod. Have the pt remove the insulin pod 1 hour after the Semglee has been given  --Start Novolog 0-6 units TID AC + HS (very sensitive scale)   --Start Novolog 2 units TID with meals for meal coverage.   Secure Chat sent to Dr.  Elvera Lennox and RN at 2pm   Pump Settings as of 01/16/2024 (verified at bedside with pt today): Basal--0.25 units/hr ICR-1 unit for every 16 carb Insulin sensitivity factor: 60 mg/dL (1 unit drops her 60 points) Target BG: 12am-8am--132 8am-5pm-140 5pm-MN-130    --Will follow patient during hospitalization--  Ambrose Finland RN, MSN, CDCES Diabetes Coordinator Inpatient Glycemic Control Team Team Pager: [phone] (8a-5p)

## 2024-01-16 NOTE — Consult Note (Addendum)
Renal Service Consult Note [facility]  [place] 01/16/2024 Maree Krabbe, MD Requesting Physician: Dr. Elvera Lennox  Reason for Consult: Renal failure HPI: The patient is a 63 y.o. year-old w/ PMH as below who presented to ED on 1/14 c/o pain at her foley insertion site, also cloudy urine and urine not flowing into the bag. Pt also had AMS, hx of UTI's. In the past had become altered in setting of a UTI. She had temp in ED of 101. Given rocephin IV. Foley exchanged and pt felt better. Urine cx's grew proteus mirabilis (was dc'd on bactrim to complete 7 days). Called back on 1/15 as blood cx's grew E faecalis and staph auricularis (1 of 4). Unclear if true bacteremia or not. IV ampicillin was continued. TTE showed no vegetation, repeat 1/16 blood cx was negative. UTI was considered already rx'd. ID dc'd IV ampicillin and started po amoxicillin for 14 days to end on 1/29.    Pt w/ hx of gait diosrder, R BKA, CKD, stage IV decub sacral, DKA, DM2, DDD, sacral osteo, anemia.   Pt denies any c/o's today. Has been using her foley cath since her leg amputation because she can't get up by herself yet to the bathroom. No nsaids. 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    Creat 1.7 on admission, was stable 3 days, bumped up to 2.2 on 1/17, then dropped to 1.9 and 1.6 on 1/18 and 1/19. Today creat bumped up to 2.2 again. UOP improved w/ IVF"s here.   BP's here in 115- 155/ 59- 85 range, no BP's < 100 since arrival.  On RA"s entire admit CXR x 1 showed no CHF or edema I/O's = + 985 cc (4.3 L in and 3.4 L out) IP meds of interest -->  - amoxil 1 gm tid started 1/19 po - IV ampicillin 2 gm q 8 hrs 1/16- 1/19 dc'd - po pred/ imuran and cyclosporine  Has not rec'd any IV contrast, nsaids/ acei's/ ARB's while here     ROS - denies CP, no joint pain, no HA, no blurry vision, no rash, no diarrhea, no nausea/ vomiting, no  dysuria, no difficulty voiding   Past Medical History  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   DDD (degenerative disc disease), lumbar    Diabetes mellitus without complication (HCC)    type 1   Diabetic ketoacidosis (HCC) 09/26/2020   Gait disorder    neurogenic   Gangrene of right foot (HCC)    Hypertension    Hypothyroid 12/08/2020   Neuropathy    Renal disease    Sacral decubitus ulcer, stage IV (HCC) 12/08/2020   Subacute osteomyelitis of right foot [facility])    Past Surgical History  Past Surgical History:  Procedure Laterality Date   AMPUTATION Right 10/01/2020   Procedure: RIGHT BELOW KNEE AMPUTATION;  Surgeon: Nadara Mustard, MD;  Location: [facility] OR;  Service: Orthopedics;  Laterality: Right;   BLADDER SURGERY     ESOPHAGOGASTRODUODENOSCOPY (EGD) WITH PROPOFOL N/A 11/10/2020   Procedure: ESOPHAGOGASTRODUODENOSCOPY (EGD) WITH PROPOFOL;  Surgeon: Hilarie Fredrickson, MD;  Location: [facility] ENDOSCOPY;  Service: Endoscopy;  Laterality: N/A;   IR GASTROSTOMY TUBE REMOVAL  03/25/2021   KIDNEY TRANSPLANT     LEG SURGERY  tib-fib fx , ORIF   Family History  Family History  Problem Relation Age of Onset   Hypertension Mother    Hypertension Father    Social History  reports that she has never smoked. She has never used smokeless tobacco. She reports that she does not drink alcohol and does not use drugs. Allergies No Known Allergies Home medications Prior to Admission medications   Medication Sig Start Date End Date Taking? Authorizing Provider  azaTHIOprine (IMURAN) 50 MG tablet Take 1 tablet (50 mg total) by mouth daily. 03/02/21  Yes Alwyn Ren, MD  cycloSPORINE modified (NEORAL) 25 MG capsule Take 75 mg by mouth in the morning and at bedtime. 12/13/21  Yes [provider]  lisinopril (ZESTRIL) 5 MG tablet Take 5 mg by mouth at  bedtime. 12/19/19  Yes [provider]  NOVOLOG 100 UNIT/ML injection Inject 100 Units into the skin See admin instructions. Per Omnipod insulin pump   Yes [provider]  predniSONE (DELTASONE) 5 MG tablet Take 1 tablet (5 mg total) by mouth daily with breakfast. 03/02/21  Yes Alwyn Ren, MD  sulfamethoxazole-trimethoprim (BACTRIM DS) 800-160 MG tablet Take 1 tablet by mouth 2 (two) times daily for 7 days. 01/10/24 01/17/24 Yes Tobey Grim, MD  levothyroxine (SYNTHROID) 100 MCG tablet Take 100 mcg by mouth daily before breakfast.    [provider]  sucralfate (CARAFATE) 1 GM/10ML suspension Take 10 mLs (1 g total) by mouth 4 (four) times daily -  with meals and at bedtime. 12/22/21 01/09/24  Rodolph Bong, MD     Vitals:   01/15/24 0635 01/15/24 1342 01/15/24 2150 01/16/24 0645  BP: (!) 155/84 (!) 151/74 (!) 150/75 (!) 169/74  Pulse: 64 74 66 65  Resp: 14 16 17 15   Temp: 98 F (36.7 C) 99 F (37.2 C) 98.2 F (36.8 C) 98 F (36.7 C)  TempSrc:  Oral Oral Oral  SpO2: 96% 98% 97% 98%  Weight:      Height:       Exam Gen alert, no distress No rash, cyanosis or gangrene Sclera anicteric, throat clear, mm's kind of moist No jvd or bruits Chest clear bilat to bases, no rales/ wheezing RRR no RG Abd soft ntnd no mass or ascites +bs GU foley cath draining clear yellowish urine good amts MS no joint effusions or deformity Ext no LE or UE edema, no other edema Neuro is alert, Ox 3 , nf     Renal-related home meds: - imuran 50 every day - cyclosporine 75 bid - prednisone 5 every day - lisinopril 5 every day - bactrim ds 800-160 bid x 7 days - others: T4, carafate, novolog insulin  Date   Creat  eGFR (ml/min) Nov- dec 2021  0.91- 3.50 Mar 2022  0.66- 1.00 Aug 2022  1.07 Nov-dec 2022  1.07- 1.61 36- 59 ml/min  06/26/22  1.08  58 ml/min 09/25/22  1.54  38 ml/min March 2024  1.50  [facility] office Aug 2024  1.80  [facility] office Dec 2024  1.67-  1.73 33-34 ml/min 01/09/24  1.73 1/14   1.80 1/15   1.66 1/16   1.78 1/17   2.26 1/18   1.99 1/19   1.60 1/20   2.23   B/l creat 1.5- 1.7, eGFR 33- 38 ml/min from sept 2023 to dec 2024.     Renal US - pending   UA 1/13 - large LE, prot 100, 0-5 rbc/ epi, 21-50 wbc  UA 1/15 - large LE, prot 30, 0-5 epi/ rbc, 21-50 wbc   Assessment/ Plan: AKI on CKD 3b - b/l creat 1.7- 1.8, eGFR 31- 33 ml/min from sept 2023 to dec 2024. Creat here was 1.7 on admission, was stable for 2 days then bumped up to 2.2, them improved w/ IVF"s and then rose up again today. No hypotension, no nephrotoxins. UA c/w infection. Making good amts of urine. Suspect vol depletion related to recent acute infection/ illness. Need [place] to r/o obstruction as well. CyA toxicity is another possibility (but unlikely). Will give 2.5 L bolus then 75 cc/hr IVF"s overnight. Get renal Tx [place] and ask pharm to send off CyA level.  E faecalis bacteremia - on po amoxicillin for 14 days total abx Proteus UTI - sp treatment  Renal transplant - done around 2021, f/b Dr Allena Katz at [facility]. Get records in am. Cont meds.       Vinson Moselle  MD [facility] 01/16/2024, 1:08 PM  Recent Labs  Lab 01/12/24 0204 01/13/24 0357 01/14/24 0411 01/15/24 0337 01/16/24 0258  HGB 9.3* 8.0* 8.2*  --   --   ALBUMIN 2.9* 2.4*  --   --   --   CALCIUM 8.8* 8.8* 8.7* 9.1 8.8*  PHOS  --  3.5  --   --   --   CREATININE 1.78* 2.26* 1.99* 1.60* 2.23*  K 4.5 4.7 4.9 5.3* 4.7   Inpatient medications:  amoxicillin  1,000 mg Oral Q8H   azaTHIOprine  50 mg Oral Daily   Chlorhexidine Gluconate Cloth  6 each Topical Daily   cycloSPORINE modified  75 mg Oral BID   heparin  5,000 Units Subcutaneous Q8H   insulin pump   Subcutaneous TID WC, HS, 0200   levothyroxine  100 mcg Oral Q0600   predniSONE  5 mg Oral Q breakfast   sodium bicarbonate  650 mg Oral TID   sodium chloride flush  3 mL Intravenous Q12H   sodium chloride flush  3 mL Intravenous Q12H    acetaminophen  **OR** acetaminophen, ondansetron **OR** ondansetron (ZOFRAN) IV, sodium chloride flush

## 2024-01-16 NOTE — Progress Notes (Signed)
PROGRESS NOTE  [name] ZOX:[id number] DOB: April 09, 1961 DOA: 01/11/2024 PCP: Stevphen Rochester, MD   LOS: 4 days   Brief Narrative / Interim history: 63 year old female with history of chronic indwelling Foley catheter, DM2, prior renal transplant with CKD 3A, hypothyroidism, right-sided BKA, chronic pressure ulcers, generalized deconditioning, peripheral neuropathy comes into the hospital after being called that her blood cultures were positive.  She was hospitalized 1/14-1/15 for sepsis due to UTI, catheter was changed and she was discharged with Bactrim.  Blood cultures came back positive for Enterococcus bacteremia and she was called to come back to the hospital.  No fever or chills, overall feeling good  Subjective / 24h Interval events: Feeling well.  Reports good appetite  Assesement and Plan: Principal Problem:   Gram-positive bacteremia Active Problems:   History of renal transplant   CKD stage 3b, GFR 30-44 ml/min (HCC)   Hypothyroidism   Insulin dependent type 1 diabetes mellitus (HCC)   Chronic indwelling Foley catheter   Pressure ulcer   Peripheral neuropathy  Principal problem Enterococcus faecalis bacteremia -on blood cultures obtained 1/15.  Blood cultures also showed staph auricularis in 1/4 bottles.  She was told to come back mainly for the Enterococcus findings.  ID consulted, possibly a contaminant but not sure, plan to treat with antibiotics for 2 weeks.  Repeat cultures during this admission without growth -Initially placed on ampicillin, now started on amoxicillin for 14 days, end date 1/29 -2D echo 1/17 shows LVEF 60 to 65%, no WMA, normal RV and no evidence of endocarditis  Active problems Proteus UTI-noticed on the urine culture obtained 1/13, pansensitive except for nitrofurantoin.  Continue amoxicillin.  Urinary catheter was exchanged at that time  Acute kidney injury chronic kidney disease stage IIIb -baseline creatinine ranging between 1.6-1.8,  creatinine increasing to 2.2 on 1/17.  Case was discussed with Dr. Marisue Humble with nephrology, this is likely to be Bactrim induced -She has received fluids for 2 days, creatinine improved to 1.6 yesterday, fluids were discontinued and unfortunately creatinine is back up to 2.2 today.  She is not hypotensive and it is unclear as to why her creatinine is rising again. -Nephrology formally consulted, appreciate input  Hyperkalemia-due to AKI, Lokelma x 1 on 1/19.  Potassium better this morning  History of renal transplant-continue home medications, discussed with nephrology, continue current suppressive regimen as is without changes  Hypothyroidism-continue Synthroid  Essential hypertension-hold home lisinopril, blood pressure acceptable and adequate for renal perfusion  Chronic sacral pressure ulcer-wound care consult  Chronic indwelling Foley catheter-follows with urology as an outpatient in [place].  Foley has been changed 1/13  Anemia of chronic renal disease-hemoglobin stable, no bleeding  DM type I, with hyperglycemia-continue insulin pump  Lab Results  Component Value Date   HGBA1C 7.2 (H) 01/09/2024   CBG (last 3)  Recent Labs    01/15/24 2151 01/16/24 0159 01/16/24 0726  GLUCAP 298* 301* 271*    Scheduled Meds:  amoxicillin  1,000 mg Oral Q8H   azaTHIOprine  50 mg Oral Daily   Chlorhexidine Gluconate Cloth  6 each Topical Daily   cycloSPORINE modified  75 mg Oral BID   heparin  5,000 Units Subcutaneous Q8H   insulin pump   Subcutaneous TID WC, HS, 0200   levothyroxine  100 mcg Oral Q0600   predniSONE  5 mg Oral Q breakfast   sodium bicarbonate  650 mg Oral TID   sodium chloride flush  3 mL Intravenous Q12H   sodium chloride flush  3 mL Intravenous Q12H   Continuous Infusions:   PRN Meds:.acetaminophen **OR** acetaminophen, ondansetron **OR** ondansetron (ZOFRAN) IV, sodium chloride flush  Current Outpatient Medications  Medication Instructions    azaTHIOprine (IMURAN) 50 mg, Oral, Daily   cycloSPORINE modified (NEORAL) 75 mg, Oral, 2 times daily   levothyroxine (SYNTHROID) 100 mcg, Oral, Daily before breakfast   lisinopril (ZESTRIL) 5 mg, Oral, Daily at bedtime   NovoLOG 100 Units, Subcutaneous, See admin instructions, Per Omnipod insulin pump    predniSONE (DELTASONE) 5 mg, Oral, Daily with breakfast   sucralfate (CARAFATE) 1 g, Oral, 3 times daily with meals & bedtime   sulfamethoxazole-trimethoprim (BACTRIM DS) 800-160 MG tablet 1 tablet, Oral, 2 times daily    Diet Orders (From admission, onward)     Start     Ordered   01/12/24 0125  Diet heart healthy/carb modified Room service appropriate? Yes; Fluid consistency: Thin  Diet effective now       Question Answer Comment  Diet-HS Snack? Nothing   Room service appropriate? Yes   Fluid consistency: Thin      01/12/24 0124            DVT prophylaxis: heparin injection 5,000 Units Start: 01/12/24 0600 SCDs Start: 01/12/24 0125 Place TED hose Start: 01/12/24 0125   Lab Results  Component Value Date   PLT 158 01/14/2024      Code Status: Limited: Do not attempt resuscitation (DNR) -DNR-LIMITED -Do Not Intubate/DNI   Family Communication: No family at bedside  Status is: Inpatient Remains inpatient appropriate because: Severity of illness   Level of care: Med-Surg  Consultants:  ID  Objective: Vitals:   01/15/24 0635 01/15/24 1342 01/15/24 2150 01/16/24 0645  BP: (!) 155/84 (!) 151/74 (!) 150/75 (!) 169/74  Pulse: 64 74 66 65  Resp: 14 16 17 15   Temp: 98 F (36.7 C) 99 F (37.2 C) 98.2 F (36.8 C) 98 F (36.7 C)  TempSrc:  Oral Oral Oral  SpO2: 96% 98% 97% 98%  Weight:      Height:        Intake/Output Summary (Last 24 hours) at 01/16/2024 1118 Last data filed at 01/16/2024 0810 Gross per 24 hour  Intake 480 ml  Output 100 ml  Net 380 ml   Wt Readings from Last 3 Encounters:  01/11/24 59 kg  01/09/24 59 kg  12/12/23 59 kg     Examination:  Constitutional: NAD Eyes: lids and conjunctivae normal, no scleral icterus ENMT: mmm Neck: normal, supple Respiratory: clear to auscultation bilaterally, no wheezing, no crackles. Normal respiratory effort.  Cardiovascular: Regular rate and rhythm, no murmurs / rubs / gallops. No LE edema. Abdomen: soft, no distention, no tenderness. Bowel sounds positive.   Data Reviewed: I have independently reviewed following labs and imaging studies   CBC Recent Labs  Lab 01/09/24 1553 01/10/24 0520 01/11/24 1756 01/12/24 0204 01/13/24 0357 01/14/24 0411  WBC 9.9 9.7 7.0 6.6 5.8 5.0  HGB 10.5* 8.5* 9.6* 9.3* 8.0* 8.2*  HCT 34.2* 27.6* 31.0* 30.4* 25.6* 27.1*  PLT 193 155 171 192 163 158  MCV 107.5* 107.8* 106.5* 107.0* 104.9* 107.5*  MCH 33.0 33.2 33.0 32.7 32.8 32.5  MCHC 30.7 30.8 31.0 30.6 31.3 30.3  RDW 14.7 14.8 14.5 14.6 14.6 14.2  LYMPHSABS 1.5  --  0.8  --   --   --   MONOABS 0.8  --  0.3  --   --   --   EOSABS 0.2  --  0.0  --   --   --   BASOSABS 0.0  --  0.0  --   --   --     Recent Labs  Lab  0000 01/09/24 1549 01/09/24 1553 01/09/24 1755 01/09/24 1800 01/09/24 2258 01/10/24 0520 01/10/24 1409 01/11/24 1756 01/12/24 0204 01/13/24 0357 01/14/24 0411 01/15/24 0337 01/16/24 0258  NA   < >  --  135  --   --   --  138   < > 134* 132* 136 134* 140 136  K   < >  --  5.4*  --   --   --  4.7   < > 4.8 4.5 4.7 4.9 5.3* 4.7  CL   < >  --  111  --   --   --  114*   < > 108 106 110 108 114* 108  CO2   < >  --  19*  --   --   --  19*   < > 17* 17* 19* 22 17* 20*  GLUCOSE   < >  --  94  --   --   --  166*   < > 342* 261* 270* 299* 271* 308*  BUN   < >  --  61*  --   --   --  52*   < > 52* 50* 53* 55* 54* 63*  CREATININE  --   --  1.73*  --    < >  --  1.80*   < > 1.66* 1.78* 2.26* 1.99* 1.60* 2.23*  CALCIUM   < >  --  9.0  --   --   --  8.6*   < > 9.2 8.8* 8.8* 8.7* 9.1 8.8*  AST  --   --  30  --   --   --   --   --  17 15 10*  --   --   --   ALT  --    --  16  --   --   --   --   --  14 12 10   --   --   --   ALKPHOS  --   --  78  --   --   --   --   --  71 69 54  --   --   --   BILITOT  --   --  0.8  --   --   --   --   --  0.6 0.3 0.4  --   --   --   ALBUMIN  --   --  3.4*  --   --   --   --   --  3.2* 2.9* 2.4*  --   --   --   MG  --   --   --   --   --   --   --   --   --   --  1.8  --   --   --   LATICACIDVEN  --  0.9  --  1.5  --   --   --   --   --   --   --   --   --   --   INR  --   --  1.0  --   --   --  1.2  --   --   --   --   --   --   --  HGBA1C  --   --   --   --   --  7.2*  --   --   --   --   --   --   --   --    < > = values in this interval not displayed.    ------------------------------------------------------------------------------------------------------------------ No results for input(s): "CHOL", "HDL", "LDLCALC", "TRIG", "CHOLHDL", "LDLDIRECT" in the last 72 hours.  Lab Results  Component Value Date   HGBA1C 7.2 (H) 01/09/2024   ------------------------------------------------------------------------------------------------------------------ No results for input(s): "TSH", "T4TOTAL", "T3FREE", "THYROIDAB" in the last 72 hours.  Invalid input(s): "FREET3"  Cardiac Enzymes No results for input(s): "CKMB", "TROPONINI", "MYOGLOBIN" in the last 168 hours.  Invalid input(s): "CK" ------------------------------------------------------------------------------------------------------------------ No results found for: "BNP"  CBG: Recent Labs  Lab 01/15/24 1157 01/15/24 1640 01/15/24 2151 01/16/24 0159 01/16/24 0726  GLUCAP 168* 232* 298* 301* 271*    Recent Results (from the past 240 hours)  Culture, blood (Routine x 2)     Status: None   Collection Time: 01/09/24  3:45 PM   Specimen: BLOOD  Result Value Ref Range Status   Specimen Description   Final    BLOOD SITE NOT SPECIFIED Performed at [facility], [address] [place] [address], [place], [place] [address]    Special Requests   Final     BOTTLES DRAWN AEROBIC AND ANAEROBIC Blood Culture results may not be optimal due to an inadequate volume of blood received in culture bottles Performed at [facility], [address] [place], [place] [address]    Culture   Final    NO GROWTH 5 DAYS Performed at [facility], [address], [place], [place] [address]    Report Status 01/14/2024 FINAL  Final  Urine Culture     Status: Abnormal   Collection Time: 01/09/24  3:47 PM   Specimen: Urine, Random  Result Value Ref Range Status   Specimen Description   Final    URINE, RANDOM Performed at [facility], [address], [place], [place] [address]    Special Requests   Final    NONE Reflexed from [id number] Performed at [facility], [address], [place], [place] [address]    Culture >=100,000 COLONIES/mL PROTEUS MIRABILIS (A)  Final   Report Status 01/11/2024 FINAL  Final   Organism ID, Bacteria PROTEUS MIRABILIS (A)  Final      Susceptibility   Proteus mirabilis - MIC*    AMPICILLIN <=2 SENSITIVE Sensitive     CEFAZOLIN <=4 SENSITIVE Sensitive     CEFEPIME <=0.12 SENSITIVE Sensitive     CEFTRIAXONE <=0.25 SENSITIVE Sensitive     CIPROFLOXACIN <=0.25 SENSITIVE Sensitive     GENTAMICIN <=1 SENSITIVE Sensitive     IMIPENEM 2 SENSITIVE Sensitive     NITROFURANTOIN 128 RESISTANT Resistant     TRIMETH/SULFA <=20 SENSITIVE Sensitive     AMPICILLIN/SULBACTAM <=2 SENSITIVE Sensitive     PIP/TAZO <=4 SENSITIVE Sensitive ug/mL    * >=100,000 COLONIES/mL PROTEUS MIRABILIS  Resp panel by RT-PCR (RSV, Flu A&B, Covid) Anterior Nasal Swab     Status: None   Collection Time: 01/09/24  4:45 PM   Specimen: Anterior Nasal Swab  Result Value Ref Range Status   SARS Coronavirus 2 by RT PCR NEGATIVE NEGATIVE Final    Comment: (NOTE) SARS-CoV-2 target nucleic acids are NOT DETECTED.  The SARS-CoV-2 RNA is generally detectable in upper respiratory specimens during the acute  phase of infection.  The lowest concentration of SARS-CoV-2 viral copies this assay can detect is 138 copies/mL. A negative result does not preclude SARS-Cov-2 infection and should not be used as the sole basis for treatment or other patient management decisions. A negative result may occur with  improper specimen collection/handling, submission of specimen other than nasopharyngeal swab, presence of viral mutation(s) within the areas targeted by this assay, and inadequate number of viral copies(<138 copies/mL). A negative result must be combined with clinical observations, patient history, and epidemiological information. The expected result is Negative.  Fact Sheet for Patients:  BloggerCourse.com  Fact Sheet for Healthcare Providers:  SeriousBroker.it  This test is no t yet approved or cleared by the [place] FDA and  has been authorized for detection and/or diagnosis of SARS-CoV-2 by FDA under an Emergency Use Authorization (EUA). This EUA will remain  in effect (meaning this test can be used) for the duration of the COVID-19 declaration under Section 564(b)(1) of the Act, 21 U.S.C.section 360bbb-3(b)(1), unless the authorization is terminated  or revoked sooner.       Influenza A by PCR NEGATIVE NEGATIVE Final   Influenza B by PCR NEGATIVE NEGATIVE Final    Comment: (NOTE) The Xpert Xpress SARS-CoV-2/FLU/RSV plus assay is intended as an aid in the diagnosis of influenza from Nasopharyngeal swab specimens and should not be used as a sole basis for treatment. Nasal washings and aspirates are unacceptable for Xpert Xpress SARS-CoV-2/FLU/RSV testing.  Fact Sheet for Patients: BloggerCourse.com  Fact Sheet for Healthcare Providers: SeriousBroker.it  This test is not yet approved or cleared by the [place] FDA and has been authorized for detection and/or diagnosis of  SARS-CoV-2 by FDA under an Emergency Use Authorization (EUA). This EUA will remain in effect (meaning this test can be used) for the duration of the COVID-19 declaration under Section 564(b)(1) of the Act, 21 U.S.C. section 360bbb-3(b)(1), unless the authorization is terminated or revoked.     Resp Syncytial Virus by PCR NEGATIVE NEGATIVE Final    Comment: (NOTE) Fact Sheet for Patients: BloggerCourse.com  Fact Sheet for Healthcare Providers: SeriousBroker.it  This test is not yet approved or cleared by the [place] FDA and has been authorized for detection and/or diagnosis of SARS-CoV-2 by FDA under an Emergency Use Authorization (EUA). This EUA will remain in effect (meaning this test can be used) for the duration of the COVID-19 declaration under Section 564(b)(1) of the Act, 21 U.S.C. section 360bbb-3(b)(1), unless the authorization is terminated or revoked.  Performed at [facility], [address], [place], [place] [address]   Culture, blood (Routine x 2)     Status: Abnormal   Collection Time: 01/09/24  4:56 PM   Specimen: BLOOD LEFT ARM  Result Value Ref Range Status   Specimen Description   Final    BLOOD LEFT ARM Performed at [facility], [address], [place], [place] [address]    Special Requests   Final    BOTTLES DRAWN AEROBIC AND ANAEROBIC Blood Culture results may not be optimal due to an inadequate volume of blood received in culture bottles Performed at [facility], [address], [place], [place] [address]    Culture  Setup Time   Final    GRAM POSITIVE COCCI IN CLUSTERS BOTTLES DRAWN AEROBIC ONLY CRITICAL RESULT CALLED TO, READ BACK BY AND VERIFIED WITH: J DANIELS MD 01/11/2024 @ 0009 BY AB GRAM POSITIVE COCCI IN CHAINS ANAEROBIC BOTTLE ONLY CRITICAL RESULT CALLED TO, READ  BACK BY AND VERIFIED WITH: DR Gwendolyn Grant [id number] AT 801 BY  CM Performed at [facility], [address], [place], [place] [address]    Culture (A)  Final    STAPHYLOCOCCUS AURICULARIS THE SIGNIFICANCE OF ISOLATING THIS ORGANISM FROM A SINGLE SET OF BLOOD CULTURES WHEN MULTIPLE SETS ARE DRAWN IS UNCERTAIN. PLEASE NOTIFY THE MICROBIOLOGY DEPARTMENT WITHIN ONE WEEK IF SPECIATION AND SENSITIVITIES ARE REQUIRED. ENTEROCOCCUS FAECALIS    Report Status 01/13/2024 FINAL  Final   Organism ID, Bacteria ENTEROCOCCUS FAECALIS  Final      Susceptibility   Enterococcus faecalis - MIC*    AMPICILLIN <=2 SENSITIVE Sensitive     VANCOMYCIN 1 SENSITIVE Sensitive     GENTAMICIN SYNERGY SENSITIVE Sensitive     * ENTEROCOCCUS FAECALIS  Blood Culture ID Panel (Reflexed)     Status: Abnormal   Collection Time: 01/09/24  4:56 PM  Result Value Ref Range Status   Enterococcus faecalis NOT DETECTED NOT DETECTED Final   Enterococcus Faecium NOT DETECTED NOT DETECTED Final   Listeria monocytogenes NOT DETECTED NOT DETECTED Final   Staphylococcus species DETECTED (A) NOT DETECTED Final    Comment: CRITICAL RESULT CALLED TO, READ BACK BY AND VERIFIED WITH: J DANIELS MD 01/11/2024 @ 0009 BY AB    Staphylococcus aureus (BCID) NOT DETECTED NOT DETECTED Final   Staphylococcus epidermidis NOT DETECTED NOT DETECTED Final   Staphylococcus lugdunensis NOT DETECTED NOT DETECTED Final   Streptococcus species NOT DETECTED NOT DETECTED Final   Streptococcus agalactiae NOT DETECTED NOT DETECTED Final   Streptococcus pneumoniae NOT DETECTED NOT DETECTED Final   Streptococcus pyogenes NOT DETECTED NOT DETECTED Final   A.calcoaceticus-baumannii NOT DETECTED NOT DETECTED Final   Bacteroides fragilis NOT DETECTED NOT DETECTED Final   Enterobacterales NOT DETECTED NOT DETECTED Final   Enterobacter cloacae complex NOT DETECTED NOT DETECTED Final   Escherichia coli NOT DETECTED NOT DETECTED Final   Klebsiella aerogenes NOT DETECTED NOT DETECTED Final   Klebsiella oxytoca NOT  DETECTED NOT DETECTED Final   Klebsiella pneumoniae NOT DETECTED NOT DETECTED Final   Proteus species NOT DETECTED NOT DETECTED Final   Salmonella species NOT DETECTED NOT DETECTED Final   Serratia marcescens NOT DETECTED NOT DETECTED Final   Haemophilus influenzae NOT DETECTED NOT DETECTED Final   Neisseria meningitidis NOT DETECTED NOT DETECTED Final   Pseudomonas aeruginosa NOT DETECTED NOT DETECTED Final   Stenotrophomonas maltophilia NOT DETECTED NOT DETECTED Final   Candida albicans NOT DETECTED NOT DETECTED Final   Candida auris NOT DETECTED NOT DETECTED Final   Candida glabrata NOT DETECTED NOT DETECTED Final   Candida krusei NOT DETECTED NOT DETECTED Final   Candida parapsilosis NOT DETECTED NOT DETECTED Final   Candida tropicalis NOT DETECTED NOT DETECTED Final   Cryptococcus neoformans/gattii NOT DETECTED NOT DETECTED Final    Comment: Performed at [facility], [address] [place], [place] [address]  MRSA Next Gen by PCR, Nasal     Status: None   Collection Time: 01/10/24  1:07 PM   Specimen: Nasal Mucosa; Nasal Swab  Result Value Ref Range Status   MRSA by PCR Next Gen NOT DETECTED NOT DETECTED Final    Comment: (NOTE) The GeneXpert MRSA Assay (FDA approved for NASAL specimens only), is one component of a comprehensive MRSA colonization surveillance program. It is not intended to diagnose MRSA infection nor to guide or monitor treatment for MRSA infections. Test performance is not FDA approved in patients less than 73 years old. Performed  at [facility], [address] [place], [place] [address]   Blood Culture ID Panel (Reflexed)     Status: Abnormal   Collection Time: 01/11/24  4:46 AM  Result Value Ref Range Status   Enterococcus faecalis DETECTED (A) NOT DETECTED Final    Comment: CRITICAL RESULT CALLED TO, READ BACK BY AND VERIFIED WITH: DR Gwendolyn Grant [id number] AT 0801 BY CM    Enterococcus Faecium NOT DETECTED NOT DETECTED Final    Listeria monocytogenes NOT DETECTED NOT DETECTED Final   Staphylococcus species NOT DETECTED NOT DETECTED Final   Staphylococcus aureus (BCID) NOT DETECTED NOT DETECTED Final   Staphylococcus epidermidis NOT DETECTED NOT DETECTED Final   Staphylococcus lugdunensis NOT DETECTED NOT DETECTED Final   Streptococcus species NOT DETECTED NOT DETECTED Final   Streptococcus agalactiae NOT DETECTED NOT DETECTED Final   Streptococcus pneumoniae NOT DETECTED NOT DETECTED Final   Streptococcus pyogenes NOT DETECTED NOT DETECTED Final   A.calcoaceticus-baumannii NOT DETECTED NOT DETECTED Final   Bacteroides fragilis NOT DETECTED NOT DETECTED Final   Enterobacterales NOT DETECTED NOT DETECTED Final   Enterobacter cloacae complex NOT DETECTED NOT DETECTED Final   Escherichia coli NOT DETECTED NOT DETECTED Final   Klebsiella aerogenes NOT DETECTED NOT DETECTED Final   Klebsiella oxytoca NOT DETECTED NOT DETECTED Final   Klebsiella pneumoniae NOT DETECTED NOT DETECTED Final   Proteus species NOT DETECTED NOT DETECTED Final   Salmonella species NOT DETECTED NOT DETECTED Final   Serratia marcescens NOT DETECTED NOT DETECTED Final   Haemophilus influenzae NOT DETECTED NOT DETECTED Final   Neisseria meningitidis NOT DETECTED NOT DETECTED Final   Pseudomonas aeruginosa NOT DETECTED NOT DETECTED Final   Stenotrophomonas maltophilia NOT DETECTED NOT DETECTED Final   Candida albicans NOT DETECTED NOT DETECTED Final   Candida auris NOT DETECTED NOT DETECTED Final   Candida glabrata NOT DETECTED NOT DETECTED Final   Candida krusei NOT DETECTED NOT DETECTED Final   Candida parapsilosis NOT DETECTED NOT DETECTED Final   Candida tropicalis NOT DETECTED NOT DETECTED Final   Cryptococcus neoformans/gattii NOT DETECTED NOT DETECTED Final   Vancomycin resistance NOT DETECTED NOT DETECTED Final    Comment: Performed at [facility], [address], [place], [place] [address]  Culture, blood (Routine X 2) w  Reflex to ID Panel     Status: None (Preliminary result)   Collection Time: 01/12/24  1:57 AM   Specimen: BLOOD  Result Value Ref Range Status   Specimen Description   Final    BLOOD LEFT ANTECUBITAL Performed at [facility], [address] [place], [place] [address]    Special Requests   Final    BOTTLES DRAWN AEROBIC AND ANAEROBIC Blood Culture results may not be optimal due to an inadequate volume of blood received in culture bottles Performed at [facility], [address], [place], [place] [address]    Culture   Final    NO GROWTH 4 DAYS Performed at [facility], [address] [place], [place] [address]    Report Status PENDING  Incomplete  Culture, blood (Routine X 2) w Reflex to ID Panel     Status: None (Preliminary result)   Collection Time: 01/12/24  2:04 AM   Specimen: BLOOD  Result Value Ref Range Status   Specimen Description   Final    BLOOD BLOOD LEFT FOREARM Performed at [facility], [address] [place], [place] [address]    Special Requests  Final    BOTTLES DRAWN AEROBIC AND ANAEROBIC Blood Culture results may not be optimal due to an inadequate volume of blood received in culture bottles Performed at [facility], [address], [place], [place] [address]    Culture   Final    NO GROWTH 4 DAYS Performed at [facility], [address], [place], [place] [address]    Report Status PENDING  Incomplete     Radiology Studies: No results found.    Pamella Pert, MD, PhD [facility]  Between 7 am - 7 pm I am available, please contact me via Amion (for emergencies) or Securechat (non urgent messages)  Between 7 pm - 7 am I am not available, please contact night coverage MD/APP via Amion

## 2024-01-16 NOTE — Plan of Care (Signed)

## 2024-01-17 ENCOUNTER — Other Ambulatory Visit (HOSPITAL_COMMUNITY): Payer: Self-pay

## 2024-01-17 ENCOUNTER — Encounter (HOSPITAL_COMMUNITY): Payer: Self-pay

## 2024-01-17 DIAGNOSIS — R7881 Bacteremia: Secondary | ICD-10-CM | POA: Diagnosis not present

## 2024-01-17 LAB — BASIC METABOLIC PANEL
Anion gap: 5 (ref 5–15)
BUN: 56 mg/dL — ABNORMAL HIGH (ref 8–23)
CO2: 19 mmol/L — ABNORMAL LOW (ref 22–32)
Calcium: 8.6 mg/dL — ABNORMAL LOW (ref 8.9–10.3)
Chloride: 110 mmol/L (ref 98–111)
Creatinine, Ser: 1.77 mg/dL — ABNORMAL HIGH (ref 0.44–1.00)
GFR, Estimated: 32 mL/min — ABNORMAL LOW (ref >60)
Glucose, Bld: 128 mg/dL — ABNORMAL HIGH (ref 70–99)
Potassium: 4.4 mmol/L (ref 3.5–5.1)
Sodium: 134 mmol/L — ABNORMAL LOW (ref 135–145)

## 2024-01-17 LAB — GLUCOSE, CAPILLARY
Glucose-Capillary: 164 mg/dL — ABNORMAL HIGH (ref 70–99)
Glucose-Capillary: 163 mg/dL — ABNORMAL HIGH (ref 70–99)

## 2024-01-17 LAB — CULTURE, BLOOD (ROUTINE X 2)
Culture: NO GROWTH
Culture: NO GROWTH

## 2024-01-17 MED ORDER — AMOXICILLIN 500 MG PO CAPS
1000.0000 mg | ORAL_CAPSULE | Freq: Three times a day (TID) | ORAL | 0 refills | Status: AC
  Filled 2024-01-17: qty 54, 9d supply, fill #0

## 2024-01-17 NOTE — Plan of Care (Signed)
°  Problem: Clinical Measurements: Goal: Diagnostic test results will improve Outcome: Progressing Goal: Respiratory complications will improve Outcome: Progressing Goal: Cardiovascular complication will be avoided Outcome: Progressing   Problem: Elimination: Goal: Will not experience complications related to bowel motility Outcome: Progressing Goal: Will not experience complications related to urinary retention Outcome: Progressing   Problem: Safety: Goal: Ability to remain free from injury will improve Outcome: Progressing   Problem: Skin Integrity: Goal: Risk for impaired skin integrity will decrease Outcome: Progressing

## 2024-01-17 NOTE — Progress Notes (Signed)
TOC discharge meds in a secure bag delivered to pt in room - primary nurse updated via chat by this RN

## 2024-01-17 NOTE — Progress Notes (Signed)
[facility] Progress Note  Subjective: seen in room. Good UOP. Creat 1.77.   Vitals:   01/16/24 0645 01/16/24 1339 01/16/24 2140 01/17/24 0604  BP: (!) 169/74 130/71 (!) 152/73 (!) 156/76  Pulse: 65 78 69 60  Resp: 15 16 15 16   Temp: 98 F (36.7 C) 98.3 F (36.8 C) 98 F (36.7 C) 98.1 F (36.7 C)  TempSrc: Oral  Oral Oral  SpO2: 98% 98% 97% 98%  Weight:      Height:        Exam: Gen alert, no distress No rash, cyanosis or gangrene Sclera anicteric, throat clear, mm's kind of moist No jvd or bruits Chest clear bilat to bases, no rales/ wheezing RRR no RG Abd soft ntnd no mass or ascites +bs GU foley cath draining clear yellowish urine good amts MS no joint effusions or deformity Ext no LE or UE edema, no other edema Neuro is alert, Ox 3 , nf      Renal-related home meds: - imuran 50 every day - cyclosporine 75 bid - prednisone 5 every day - lisinopril 5 every day - bactrim ds 800-160 bid x 7 days - others: T4, carafate, novolog insulin   Date                             Creat               eGFR (ml/min) Nov- dec 2021             0.91- 3.50 Mar 2022                     0.66- 1.00 Aug 2022                     1.07 Nov-dec 2022              1.07- 1.61        36- 59 ml/min   06/26/22                        1.08                 58 ml/min 09/25/22                        1.54                 38 ml/min March 2024                 1.50                 [facility] Aug 2024                     1.80                 [facility] Dec 2024                     1.67- 1.73        33-34 ml/min 01/09/24                        1.73 1/14                             1.80 1/15  1.66 1/16                             1.78 1/17                             2.26 1/18                             1.99 1/19                             1.60 1/20                             2.23     UA 1/13 - large LE, prot 100, 0-5 rbc/ epi, 21-50 wbc    UA 1/15 - large  LE, prot 30, 0-5 epi/ rbc, 21-50 wbc     Assessment/ Plan: AKI on CKD 3b - b/l creat 1.7- 1.8, eGFR 31- 33 ml/min from sept 2023 to dec 2024. Creat here was 1.7 on admission, was stable for 2 days then bumped up to 2.2, them improved w/ IVF"s and then rose up again today. No hypotension, no nephrotoxins. UA c/w infection. Making good amts of urine. Suspect AKI due to vol depletion related to recent acute infection/ illness. US showed no obstruction. CyA level low 200s (range 100- 400). Pt rec'd 2.5 L bolus + IVF"s overnight. Creat down to 1.7 today. OK for dc. Would hold acei for 1- 2 wks. She says she has appt w/ Dr Allena Katz in 1-2 wks. No other suggestions, will sign off.  E faecalis bacteremia - on po amoxicillin for 14 days total abx Proteus UTI - sp treatment  Renal transplant - done around 2021, f/b Dr Allena Katz at [facility]. Cont meds.              Vinson Moselle MD  [facility] 01/17/2024, 8:45 AM  Recent Labs  Lab 01/12/24 0204 01/13/24 0357 01/14/24 0411 01/15/24 0337 01/16/24 0258 01/17/24 0333  HGB 9.3* 8.0* 8.2*  --   --   --   ALBUMIN 2.9* 2.4*  --   --   --   --   CALCIUM 8.8* 8.8* 8.7*   < > 8.8* 8.6*  PHOS  --  3.5  --   --   --   --   CREATININE 1.78* 2.26* 1.99*   < > 2.23* 1.77*  K 4.5 4.7 4.9   < > 4.7 4.4   < > = values in this interval not displayed.   No results for input(s): "IRON", "TIBC", "FERRITIN" in the last 168 hours. Inpatient medications:  amoxicillin  1,000 mg Oral Q8H   azaTHIOprine  50 mg Oral Daily   Chlorhexidine Gluconate Cloth  6 each Topical Daily   cycloSPORINE modified  75 mg Oral BID   heparin  5,000 Units Subcutaneous Q8H   insulin aspart  0-6 Units Subcutaneous TID WC   insulin aspart  2 Units Subcutaneous TID WC   insulin glargine-yfgn  6 Units Subcutaneous Daily   levothyroxine  100 mcg Oral Q0600   predniSONE  5 mg Oral Q breakfast   sodium bicarbonate  650 mg Oral TID   sodium chloride flush  3 mL Intravenous Q12H   sodium chloride flush  3 mL  Intravenous Q12H    lactated ringers 75 mL/hr at 01/16/24 2008   acetaminophen **OR** acetaminophen, ondansetron **OR** ondansetron (ZOFRAN) IV, sodium chloride flush

## 2024-01-17 NOTE — Discharge Summary (Signed)
Physician Discharge Summary  [name] ZOX:[id number] DOB: Dec 27, 1961 DOA: 01/11/2024  PCP: Stevphen Rochester, MD  Admit date: 01/11/2024 Discharge date: 01/17/2024  Admitted From: home Disposition:  home  Recommendations for Outpatient Follow-up:  Follow up with PCP in 1-2 weeks Please obtain BMP/CBC in one week Follow-up with Dr. Allena Katz in the next 1 to 2 weeks as scheduled  Home Health: none Equipment/Devices: none  Discharge Condition: stable CODE STATUS: DNR Diet Orders (From admission, onward)     Start     Ordered   01/12/24 0125  Diet heart healthy/carb modified Room service appropriate? Yes; Fluid consistency: Thin  Diet effective now       Question Answer Comment  Diet-HS Snack? Nothing   Room service appropriate? Yes   Fluid consistency: Thin      01/12/24 0124            HPI: Per admitting MD, [name] is a 63 y.o. female with medical history significant of chronic indwelling Foley catheter, DM type II, gout, history of renal transplant, hypothyroidism, CKD stage IIIa, right sided amputation below knee, chronic pressure ulcer, generalized deconditioning, sacral pressure ulcer, and peripheral neuropathy. Patient was hospitalized 1/14 to 1/15 for sepsis secondary to UTI.  She has been treated with ceftriaxone and on discharge has been prescribed Bactrim to complete 7 days course.  However given blood culture came back positive with bacteremia Dr. Gwendolyn Grant informed and called the patient to come back to hospital for IV antibiotic treatment. Patient was discharged from the hospital 01/10/2024 after managing with sepsis secondary to complicated UTI.  Per chart review patient's urine culture was positive with Proteus mirabilis  which is pansensitive sensitive.  Patient was treated with Rocephin and discharged with Bactrim double strength to complete 7 days course. Blood culture from 1/13 grew Staphylococcus species and 1/15 growing Enterococcus faecalis. To my  evaluation at the bedside patient denies any fever and chill.  Denies any chest pain, shortness of breath, palpitation, headache, nausea, abdominal pain, constipation, diarrhea, change of weight and appetite.  No other complaint at this time.  Hospital Course / Discharge diagnoses: Principal Problem:   Gram-positive bacteremia Active Problems:   History of renal transplant   CKD stage 3b, GFR 30-44 ml/min (HCC)   Hypothyroidism   Insulin dependent type 1 diabetes mellitus (HCC)   Chronic indwelling Foley catheter   Pressure ulcer   Peripheral neuropathy   Principal problem Enterococcus faecalis bacteremia -on blood cultures obtained 1/15.  Blood cultures also showed staph auricularis in 1/4 bottles.  She was told to come back mainly for the Enterococcus findings.  ID consulted, possibly a contaminant but not entirely sure, plan to treat with antibiotics for 2 weeks.  Repeat cultures during this admission without growth. Initially placed on ampicillin, now started on amoxicillin for 14 days, end date 1/29. 2D echo 1/17 shows LVEF 60 to 65%, no WMA, normal RV and no evidence of endocarditis   Active problems Proteus UTI-noticed on the urine culture obtained 1/13, pansensitive except for nitrofurantoin.  Continue amoxicillin.  Urinary catheter was exchanged at that time Acute kidney injury chronic kidney disease stage IIIb -baseline creatinine ranging between 1.6-1.8, creatinine increasing to 2.2 on 1/17.  She responded to fluids, however upon discontinuation of IVF creatinine increased again to 2.2.  Nephrology consulted and evaluated patient while hospitalized, received additional fluids as she was felt to be dehydrated.  Her creatinine eventually improved at 1.7, which is at baseline, discussed with nephrology and she  will be discharged home in stable condition.  She was advised to hold her ACE inhibitor upon discharge and potentially resume as an outpatient in 1 to 2 weeks when she follows up  with her primary nephrologist Hyperkalemia-due to AKI, Lokelma x 1 on 1/19.  Potassium better  History of renal transplant-continue home medications  Hypothyroidism-continue Synthroid Essential hypertension-hold home lisinopril, blood pressure acceptable and adequate for renal perfusion Chronic sacral pressure ulcer-wound care consult Chronic indwelling Foley catheter-follows with urology as an outpatient in [place].  Foley has been changed 1/13 Anemia of chronic renal disease-hemoglobin stable, no bleeding DM type I, with hyperglycemia-continue insulin pump  Sepsis ruled out   Discharge Instructions   Allergies as of 01/17/2024   No Known Allergies      Medication List     STOP taking these medications    lisinopril 5 MG tablet Commonly known as: ZESTRIL   sulfamethoxazole-trimethoprim 800-160 MG tablet Commonly known as: Bactrim DS       TAKE these medications    amoxicillin 500 MG capsule Commonly known as: AMOXIL Take 2 capsules (1,000 mg total) by mouth every 8 (eight) hours for 9 days.   azaTHIOprine 50 MG tablet Commonly known as: IMURAN Take 1 tablet (50 mg total) by mouth daily.   cycloSPORINE modified 25 MG capsule Commonly known as: NEORAL Take 75 mg by mouth in the morning and at bedtime.   levothyroxine 100 MCG tablet Commonly known as: SYNTHROID Take 100 mcg by mouth daily before breakfast.   NovoLOG 100 UNIT/ML injection Generic drug: insulin aspart Inject 100 Units into the skin See admin instructions. Per Omnipod insulin pump   predniSONE 5 MG tablet Commonly known as: DELTASONE Take 1 tablet (5 mg total) by mouth daily with breakfast.   sucralfate 1 GM/10ML suspension Commonly known as: CARAFATE Take 10 mLs (1 g total) by mouth 4 (four) times daily -  with meals and at bedtime.       Consultations: Infectious disease Nephrology  Procedures/Studies:  US Renal Transplant w/Doppler Result Date: 01/16/2024 CLINICAL DATA:   AKI, kidney transplant, bladder surgery, EXAM: ULTRASOUND OF RENAL TRANSPLANT WITH RENAL DOPPLER ULTRASOUND TECHNIQUE: Ultrasound examination of the renal transplant was performed with gray-scale, color and duplex doppler evaluation. COMPARISON:  CT 12/17/2021 FINDINGS: Transplant kidney location: Left lower quadrant Transplant Kidney: Renal measurements: 6.6 x 4.1 x 4.9 cm = volume: 70mL. Normal in size and parenchymal echogenicity. No evidence of mass or hydronephrosis. Trace free fluid in the left lower quadrant. Echogenic focus measuring 9 mm in the central renal allograft with twinkle and without significant shadowing may be a stone. Color flow in the main renal artery:  Yes Color flow in the main renal vein:  Yes Duplex Doppler Evaluation: Main Renal Artery Velocity: 35 cm/sec Main Renal Artery Resistive Index: 0.73 Venous waveform in main renal vein:  Present Intrarenal resistive index in upper pole:  0.59 (normal 0.6-0.8; equivocal 0.8-0.9; abnormal >= 0.9) Intrarenal resistive index in lower pole: 0.45 (normal 0.6-0.8; equivocal 0.8-0.9; abnormal >= 0.9) Bladder: Foley catheter in the nondistended bladder. Other findings:  None. IMPRESSION: 1. Left lower quadrant renal allograft. Possible 9 mm stone in the central transplant kidney. No hydronephrosis. 2. Trace free fluid in the left lower quadrant. Electronically Signed   By: Minerva Fester M.D.   On: 01/16/2024 20:41   ECHOCARDIOGRAM COMPLETE Result Date: 01/13/2024    ECHOCARDIOGRAM REPORT   Patient Name:   [name] Date of Exam: 01/13/2024 Medical Rec #:  [id number]       Height:       63.0 in Accession #:    [id number]      Weight:       130.1 lb Date of Birth:  1961-11-15      BSA:          1.611 m Patient Age:    62 years        BP:           149/66 mmHg Patient Gender: F               HR:           79 bpm. Exam Location:  Inpatient Procedure: 2D Echo, Cardiac Doppler and Color Doppler Indications:    Endocarditis  History:        Patient  has prior history of Echocardiogram examinations, most                 recent 12/08/2020. Signs/Symptoms:Bacteremia; Risk                 Factors:Diabetes and Hypertension.  Sonographer:    Webb Laws Referring Phys: [id number] [facility] SINGH IMPRESSIONS  1. If risk of SBE is high clinically can consider TEE given sub optimal image quality of TTE.  2. Left ventricular ejection fraction, by estimation, is 60 to 65%. The left ventricle has normal function. The left ventricle has no regional wall motion abnormalities. There is mild left ventricular hypertrophy. Left ventricular diastolic parameters were normal.  3. Right ventricular systolic function is normal. The right ventricular size is normal.  4. The mitral valve is abnormal. No evidence of mitral valve regurgitation. No evidence of mitral stenosis.  5. The aortic valve was not well visualized. There is mild calcification of the aortic valve. There is mild thickening of the aortic valve. Aortic valve regurgitation is trivial. Aortic valve sclerosis is present, with no evidence of aortic valve stenosis.  6. The inferior vena cava is normal in size with greater than 50% respiratory variability, suggesting right atrial pressure of 3 mmHg. FINDINGS  Left Ventricle: Left ventricular ejection fraction, by estimation, is 60 to 65%. The left ventricle has normal function. The left ventricle has no regional wall motion abnormalities. The left ventricular internal cavity size was normal in size. There is  mild left ventricular hypertrophy. Left ventricular diastolic parameters were normal. Right Ventricle: The right ventricular size is normal. No increase in right ventricular wall thickness. Right ventricular systolic function is normal. Left Atrium: Left atrial size was normal in size. Right Atrium: Right atrial size was normal in size. Pericardium: There is no evidence of pericardial effusion. Mitral Valve: The mitral valve is abnormal. There is mild thickening of the  mitral valve leaflet(s). There is mild calcification of the mitral valve leaflet(s). Mild mitral annular calcification. No evidence of mitral valve regurgitation. No evidence of mitral valve stenosis. Tricuspid Valve: The tricuspid valve is not well visualized. Tricuspid valve regurgitation is not demonstrated. No evidence of tricuspid stenosis. Aortic Valve: The aortic valve was not well visualized. There is mild calcification of the aortic valve. There is mild thickening of the aortic valve. Aortic valve regurgitation is trivial. Aortic regurgitation PHT measures 688 msec. Aortic valve sclerosis is present, with no evidence of aortic valve stenosis. Pulmonic Valve: The pulmonic valve was not well visualized. Pulmonic valve regurgitation is not visualized. No evidence of pulmonic stenosis. Aorta: The aortic root is normal in size and structure. Venous: The inferior vena  cava is normal in size with greater than 50% respiratory variability, suggesting right atrial pressure of 3 mmHg. IAS/Shunts: No atrial level shunt detected by color flow Doppler. Additional Comments: If risk of SBE is high clinically can consider TEE given sub optimal image quality of TTE.  LEFT VENTRICLE PLAX 2D LVIDd:         3.40 cm     Diastology LVIDs:         2.30 cm     LV e' medial:    5.11 cm/s LV PW:         1.10 cm     LV E/e' medial:  15.1 LV IVS:        1.30 cm     LV e' lateral:   5.77 cm/s LVOT diam:     1.20 cm     LV E/e' lateral: 13.4 LV SV:         28 LV SV Index:   17 LVOT Area:     1.13 cm  LV Volumes (MOD) LV vol d, MOD A2C: 34.5 ml LV vol d, MOD A4C: 47.9 ml LV vol s, MOD A2C: 8.9 ml LV vol s, MOD A4C: 11.5 ml LV SV MOD A2C:     25.6 ml LV SV MOD A4C:     47.9 ml LV SV MOD BP:      31.1 ml RIGHT VENTRICLE             IVC RV Basal diam:  2.50 cm     IVC diam: 1.10 cm RV S prime:     17.50 cm/s TAPSE (M-mode): 2.4 cm LEFT ATRIUM             Index        RIGHT ATRIUM          Index LA diam:        3.00 cm 1.86 cm/m   RA Area:      7.21 cm LA Vol (A2C):   14.1 ml 8.75 ml/m   RA Volume:   8.51 ml  5.28 ml/m LA Vol (A4C):   20.5 ml 12.73 ml/m LA Biplane Vol: 17.7 ml 10.99 ml/m  AORTIC VALVE LVOT Vmax:   112.00 cm/s LVOT Vmean:  86.400 cm/s LVOT VTI:    0.249 m AI PHT:      688 msec  AORTA Ao Root diam: 2.90 cm Ao Asc diam:  2.90 cm MITRAL VALVE MV Area (PHT): 3.63 cm    SHUNTS MV Decel Time: 209 msec    Systemic VTI:  0.25 m MV E velocity: 77.10 cm/s  Systemic Diam: 1.20 cm MV A velocity: 84.50 cm/s MV E/A ratio:  0.91 Charlton Haws MD Electronically signed by Charlton Haws MD Signature Date/Time: 01/13/2024/2:01:55 PM    Final    DG Chest Port 1 View Result Date: 01/09/2024 CLINICAL DATA:  Suspected sepsis. EXAM: PORTABLE CHEST 1 VIEW COMPARISON:  Chest radiograph dated 12/17/2021. FINDINGS: No focal consolidation, pleural effusion, pneumothorax. Mild cardiomegaly. Atherosclerotic calcification of the aortic arch. No acute osseous pathology. Degenerative changes of the spine and left shoulder. IMPRESSION: 1. No active disease. 2. Mild cardiomegaly. Electronically Signed   By: Elgie Collard M.D.   On: 01/09/2024 17:15     Subjective: - no chest pain, shortness of breath, no abdominal pain, nausea or vomiting.   Discharge Exam: BP (!) 156/74 (BP Location: Left Arm)   Pulse 69   Temp 97.8 F (36.6 C)   Resp 17  Ht 5\' 3"  (1.6 m)   Wt 59 kg   SpO2 100%   BMI 23.04 kg/m   General: Pt is alert, awake, not in acute distress Cardiovascular: RRR, S1/S2 +, no rubs, no gallops Respiratory: CTA bilaterally, no wheezing, no rhonchi Abdominal: Soft, NT, ND, bowel sounds + Extremities: no edema, no cyanosis    The results of significant diagnostics from this hospitalization (including imaging, microbiology, ancillary and laboratory) are listed below for reference.     Microbiology: Recent Results (from the past 240 hours)  Culture, blood (Routine x 2)     Status: None   Collection Time: 01/09/24  3:45 PM    Specimen: BLOOD  Result Value Ref Range Status   Specimen Description   Final    BLOOD SITE NOT SPECIFIED Performed at [facility], [address] [place], [place] [address]    Special Requests   Final    BOTTLES DRAWN AEROBIC AND ANAEROBIC Blood Culture results may not be optimal due to an inadequate volume of blood received in culture bottles Performed at [facility], [address], [place], [place] [address]    Culture   Final    NO GROWTH 5 DAYS Performed at [facility], [address] [place], [place] [address]    Report Status 01/14/2024 FINAL  Final  Urine Culture     Status: Abnormal   Collection Time: 01/09/24  3:47 PM   Specimen: Urine, Random  Result Value Ref Range Status   Specimen Description   Final    URINE, RANDOM Performed at [facility], [address] [place], [place] [address]    Special Requests   Final    NONE Reflexed from [id number] Performed at [name], [address], [place], [place] [address]    Culture >=100,000 COLONIES/mL PROTEUS MIRABILIS (A)  Final   Report Status 01/11/2024 FINAL  Final   Organism ID, Bacteria PROTEUS MIRABILIS (A)  Final      Susceptibility   Proteus mirabilis - MIC*    AMPICILLIN <=2 SENSITIVE Sensitive     CEFAZOLIN <=4 SENSITIVE Sensitive     CEFEPIME <=0.12 SENSITIVE Sensitive     CEFTRIAXONE <=0.25 SENSITIVE Sensitive     CIPROFLOXACIN <=0.25 SENSITIVE Sensitive     GENTAMICIN <=1 SENSITIVE Sensitive     IMIPENEM 2 SENSITIVE Sensitive     NITROFURANTOIN 128 RESISTANT Resistant     TRIMETH/SULFA <=20 SENSITIVE Sensitive     AMPICILLIN/SULBACTAM <=2 SENSITIVE Sensitive     PIP/TAZO <=4 SENSITIVE Sensitive ug/mL    * >=100,000 COLONIES/mL PROTEUS MIRABILIS  Resp panel by RT-PCR (RSV, Flu A&B, Covid) Anterior Nasal Swab     Status: None   Collection Time: 01/09/24  4:45 PM   Specimen: Anterior Nasal Swab  Result Value Ref Range  Status   SARS Coronavirus 2 by RT PCR NEGATIVE NEGATIVE Final    Comment: (NOTE) SARS-CoV-2 target nucleic acids are NOT DETECTED.  The SARS-CoV-2 RNA is generally detectable in upper respiratory specimens during the acute phase of infection. The lowest concentration of SARS-CoV-2 viral copies this assay can detect is 138 copies/mL. A negative result does not preclude SARS-Cov-2 infection and should not be used as the sole basis for treatment or other patient management decisions. A negative result may occur with  improper specimen collection/handling, submission of specimen other than nasopharyngeal swab, presence of viral mutation(s) within the areas targeted by this assay, and inadequate number of viral  copies(<138 copies/mL). A negative result must be combined with clinical observations, patient history, and epidemiological information. The expected result is Negative.  Fact Sheet for Patients:  BloggerCourse.com  Fact Sheet for Healthcare Providers:  SeriousBroker.it  This test is no t yet approved or cleared by the [place] FDA and  has been authorized for detection and/or diagnosis of SARS-CoV-2 by FDA under an Emergency Use Authorization (EUA). This EUA will remain  in effect (meaning this test can be used) for the duration of the COVID-19 declaration under Section 564(b)(1) of the Act, 21 U.S.C.section 360bbb-3(b)(1), unless the authorization is terminated  or revoked sooner.       Influenza A by PCR NEGATIVE NEGATIVE Final   Influenza B by PCR NEGATIVE NEGATIVE Final    Comment: (NOTE) The Xpert Xpress SARS-CoV-2/FLU/RSV plus assay is intended as an aid in the diagnosis of influenza from Nasopharyngeal swab specimens and should not be used as a sole basis for treatment. Nasal washings and aspirates are unacceptable for Xpert Xpress SARS-CoV-2/FLU/RSV testing.  Fact Sheet for  Patients: BloggerCourse.com  Fact Sheet for Healthcare Providers: SeriousBroker.it  This test is not yet approved or cleared by the [place] FDA and has been authorized for detection and/or diagnosis of SARS-CoV-2 by FDA under an Emergency Use Authorization (EUA). This EUA will remain in effect (meaning this test can be used) for the duration of the COVID-19 declaration under Section 564(b)(1) of the Act, 21 U.S.C. section 360bbb-3(b)(1), unless the authorization is terminated or revoked.     Resp Syncytial Virus by PCR NEGATIVE NEGATIVE Final    Comment: (NOTE) Fact Sheet for Patients: BloggerCourse.com  Fact Sheet for Healthcare Providers: SeriousBroker.it  This test is not yet approved or cleared by the [place] FDA and has been authorized for detection and/or diagnosis of SARS-CoV-2 by FDA under an Emergency Use Authorization (EUA). This EUA will remain in effect (meaning this test can be used) for the duration of the COVID-19 declaration under Section 564(b)(1) of the Act, 21 U.S.C. section 360bbb-3(b)(1), unless the authorization is terminated or revoked.  Performed at [facility], [address], [place], [place] [address]   Culture, blood (Routine x 2)     Status: Abnormal   Collection Time: 01/09/24  4:56 PM   Specimen: BLOOD LEFT ARM  Result Value Ref Range Status   Specimen Description   Final    BLOOD LEFT ARM Performed at [facility], [address], [place], [place] [address]    Special Requests   Final    BOTTLES DRAWN AEROBIC AND ANAEROBIC Blood Culture results may not be optimal due to an inadequate volume of blood received in culture bottles Performed at [facility], [address] [place], [place] [address]    Culture  Setup Time   Final    GRAM POSITIVE COCCI IN CLUSTERS BOTTLES  DRAWN AEROBIC ONLY CRITICAL RESULT CALLED TO, READ BACK BY AND VERIFIED WITH: J DANIELS MD 01/11/2024 @ 0009 BY AB GRAM POSITIVE COCCI IN CHAINS ANAEROBIC BOTTLE ONLY CRITICAL RESULT CALLED TO, READ BACK BY AND VERIFIED WITH: DR Gwendolyn Grant [id number] AT 801 BY CM Performed at [facility], [address] [place], [place] [address]    Culture (A)  Final    STAPHYLOCOCCUS AURICULARIS THE SIGNIFICANCE OF ISOLATING THIS ORGANISM FROM A SINGLE SET OF BLOOD CULTURES WHEN MULTIPLE SETS ARE DRAWN IS UNCERTAIN. PLEASE NOTIFY THE MICROBIOLOGY DEPARTMENT WITHIN ONE WEEK IF SPECIATION AND SENSITIVITIES ARE  REQUIRED. ENTEROCOCCUS FAECALIS    Report Status 01/13/2024 FINAL  Final   Organism ID, Bacteria ENTEROCOCCUS FAECALIS  Final      Susceptibility   Enterococcus faecalis - MIC*    AMPICILLIN <=2 SENSITIVE Sensitive     VANCOMYCIN 1 SENSITIVE Sensitive     GENTAMICIN SYNERGY SENSITIVE Sensitive     * ENTEROCOCCUS FAECALIS  Blood Culture ID Panel (Reflexed)     Status: Abnormal   Collection Time: 01/09/24  4:56 PM  Result Value Ref Range Status   Enterococcus faecalis NOT DETECTED NOT DETECTED Final   Enterococcus Faecium NOT DETECTED NOT DETECTED Final   Listeria monocytogenes NOT DETECTED NOT DETECTED Final   Staphylococcus species DETECTED (A) NOT DETECTED Final    Comment: CRITICAL RESULT CALLED TO, READ BACK BY AND VERIFIED WITH: J DANIELS MD 01/11/2024 @ 0009 BY AB    Staphylococcus aureus (BCID) NOT DETECTED NOT DETECTED Final   Staphylococcus epidermidis NOT DETECTED NOT DETECTED Final   Staphylococcus lugdunensis NOT DETECTED NOT DETECTED Final   Streptococcus species NOT DETECTED NOT DETECTED Final   Streptococcus agalactiae NOT DETECTED NOT DETECTED Final   Streptococcus pneumoniae NOT DETECTED NOT DETECTED Final   Streptococcus pyogenes NOT DETECTED NOT DETECTED Final   A.calcoaceticus-baumannii NOT DETECTED NOT DETECTED Final   Bacteroides fragilis NOT DETECTED NOT DETECTED Final    Enterobacterales NOT DETECTED NOT DETECTED Final   Enterobacter cloacae complex NOT DETECTED NOT DETECTED Final   Escherichia coli NOT DETECTED NOT DETECTED Final   Klebsiella aerogenes NOT DETECTED NOT DETECTED Final   Klebsiella oxytoca NOT DETECTED NOT DETECTED Final   Klebsiella pneumoniae NOT DETECTED NOT DETECTED Final   Proteus species NOT DETECTED NOT DETECTED Final   Salmonella species NOT DETECTED NOT DETECTED Final   Serratia marcescens NOT DETECTED NOT DETECTED Final   Haemophilus influenzae NOT DETECTED NOT DETECTED Final   Neisseria meningitidis NOT DETECTED NOT DETECTED Final   Pseudomonas aeruginosa NOT DETECTED NOT DETECTED Final   Stenotrophomonas maltophilia NOT DETECTED NOT DETECTED Final   Candida albicans NOT DETECTED NOT DETECTED Final   Candida auris NOT DETECTED NOT DETECTED Final   Candida glabrata NOT DETECTED NOT DETECTED Final   Candida krusei NOT DETECTED NOT DETECTED Final   Candida parapsilosis NOT DETECTED NOT DETECTED Final   Candida tropicalis NOT DETECTED NOT DETECTED Final   Cryptococcus neoformans/gattii NOT DETECTED NOT DETECTED Final    Comment: Performed at [facility], [address] [place], [place] [address]  MRSA Next Gen by PCR, Nasal     Status: None   Collection Time: 01/10/24  1:07 PM   Specimen: Nasal Mucosa; Nasal Swab  Result Value Ref Range Status   MRSA by PCR Next Gen NOT DETECTED NOT DETECTED Final    Comment: (NOTE) The GeneXpert MRSA Assay (FDA approved for NASAL specimens only), is one component of a comprehensive MRSA colonization surveillance program. It is not intended to diagnose MRSA infection nor to guide or monitor treatment for MRSA infections. Test performance is not FDA approved in patients less than 80 years old. Performed at [facility], [address] [place], [place] [address]   Blood Culture ID Panel (Reflexed)     Status: Abnormal   Collection Time: 01/11/24  4:46 AM  Result  Value Ref Range Status   Enterococcus faecalis DETECTED (A) NOT DETECTED Final    Comment: CRITICAL RESULT CALLED TO, READ BACK BY AND VERIFIED WITH: DR Gwendolyn Grant [id number] AT 0801 BY CM  Enterococcus Faecium NOT DETECTED NOT DETECTED Final   Listeria monocytogenes NOT DETECTED NOT DETECTED Final   Staphylococcus species NOT DETECTED NOT DETECTED Final   Staphylococcus aureus (BCID) NOT DETECTED NOT DETECTED Final   Staphylococcus epidermidis NOT DETECTED NOT DETECTED Final   Staphylococcus lugdunensis NOT DETECTED NOT DETECTED Final   Streptococcus species NOT DETECTED NOT DETECTED Final   Streptococcus agalactiae NOT DETECTED NOT DETECTED Final   Streptococcus pneumoniae NOT DETECTED NOT DETECTED Final   Streptococcus pyogenes NOT DETECTED NOT DETECTED Final   A.calcoaceticus-baumannii NOT DETECTED NOT DETECTED Final   Bacteroides fragilis NOT DETECTED NOT DETECTED Final   Enterobacterales NOT DETECTED NOT DETECTED Final   Enterobacter cloacae complex NOT DETECTED NOT DETECTED Final   Escherichia coli NOT DETECTED NOT DETECTED Final   Klebsiella aerogenes NOT DETECTED NOT DETECTED Final   Klebsiella oxytoca NOT DETECTED NOT DETECTED Final   Klebsiella pneumoniae NOT DETECTED NOT DETECTED Final   Proteus species NOT DETECTED NOT DETECTED Final   Salmonella species NOT DETECTED NOT DETECTED Final   Serratia marcescens NOT DETECTED NOT DETECTED Final   Haemophilus influenzae NOT DETECTED NOT DETECTED Final   Neisseria meningitidis NOT DETECTED NOT DETECTED Final   Pseudomonas aeruginosa NOT DETECTED NOT DETECTED Final   Stenotrophomonas maltophilia NOT DETECTED NOT DETECTED Final   Candida albicans NOT DETECTED NOT DETECTED Final   Candida auris NOT DETECTED NOT DETECTED Final   Candida glabrata NOT DETECTED NOT DETECTED Final   Candida krusei NOT DETECTED NOT DETECTED Final   Candida parapsilosis NOT DETECTED NOT DETECTED Final   Candida tropicalis NOT DETECTED NOT DETECTED Final    Cryptococcus neoformans/gattii NOT DETECTED NOT DETECTED Final   Vancomycin resistance NOT DETECTED NOT DETECTED Final    Comment: Performed at [facility], [address] [place], [place] [address]  Culture, blood (Routine X 2) w Reflex to ID Panel     Status: None   Collection Time: 01/12/24  1:57 AM   Specimen: BLOOD  Result Value Ref Range Status   Specimen Description   Final    BLOOD LEFT ANTECUBITAL Performed at [facility], [address], [place], [place] [address]    Special Requests   Final    BOTTLES DRAWN AEROBIC AND ANAEROBIC Blood Culture results may not be optimal due to an inadequate volume of blood received in culture bottles Performed at [facility], [address] [place], [place] [address]    Culture   Final    NO GROWTH 5 DAYS Performed at [facility], [address] [place], [place] [address]    Report Status 01/17/2024 FINAL  Final  Culture, blood (Routine X 2) w Reflex to ID Panel     Status: None   Collection Time: 01/12/24  2:04 AM   Specimen: BLOOD  Result Value Ref Range Status   Specimen Description   Final    BLOOD BLOOD LEFT FOREARM Performed at [facility], [address] [place], [place] [address]    Special Requests   Final    BOTTLES DRAWN AEROBIC AND ANAEROBIC Blood Culture results may not be optimal due to an inadequate volume of blood received in culture bottles Performed at [facility], [address] [place], [place] [address]    Culture   Final    NO GROWTH 5 DAYS Performed at [facility], [address] [place], [place] [address]    Report Status 01/17/2024 FINAL  Final     Labs: Basic Metabolic Panel: Recent Labs  Lab 01/13/24 0357 01/14/24 0411 01/15/24 0337 01/16/24 0258 01/17/24 0333  NA 136 134* 140 136 134*  K 4.7 4.9 5.3* 4.7 4.4  CL 110 108 114* 108 110  CO2 19* 22 17* 20* 19*  GLUCOSE 270* 299* 271* 308* 128*   BUN 53* 55* 54* 63* 56*  CREATININE 2.26* 1.99* 1.60* 2.23* 1.77*  CALCIUM 8.8* 8.7* 9.1 8.8* 8.6*  MG 1.8  --   --   --   --   PHOS 3.5  --   --   --   --    Liver Function Tests: Recent Labs  Lab 01/11/24 1756 01/12/24 0204 01/13/24 0357  AST 17 15 10*  ALT 14 12 10   ALKPHOS 71 69 54  BILITOT 0.6 0.3 0.4  PROT 7.0 6.2* 5.3*  ALBUMIN 3.2* 2.9* 2.4*   CBC: Recent Labs  Lab 01/11/24 1756 01/12/24 0204 01/13/24 0357 01/14/24 0411  WBC 7.0 6.6 5.8 5.0  NEUTROABS 5.7  --   --   --   HGB 9.6* 9.3* 8.0* 8.2*  HCT 31.0* 30.4* 25.6* 27.1*  MCV 106.5* 107.0* 104.9* 107.5*  PLT 171 192 163 158   CBG: Recent Labs  Lab 01/16/24 1156 01/16/24 1632 01/16/24 2141 01/17/24 0750 01/17/24 1156  GLUCAP 367* 321* 226* 164* 163*   Hgb A1c No results for input(s): "HGBA1C" in the last 72 hours. Lipid Profile No results for input(s): "CHOL", "HDL", "LDLCALC", "TRIG", "CHOLHDL", "LDLDIRECT" in the last 72 hours. Thyroid function studies No results for input(s): "TSH", "T4TOTAL", "T3FREE", "THYROIDAB" in the last 72 hours.  Invalid input(s): "FREET3" Urinalysis    Component Value Date/Time   COLORURINE YELLOW 01/11/2024 2041   APPEARANCEUR CLEAR 01/11/2024 2041   LABSPEC 1.010 01/11/2024 2041   PHURINE 6.0 01/11/2024 2041   GLUCOSEU >=500 (A) 01/11/2024 2041   HGBUR SMALL (A) 01/11/2024 2041   BILIRUBINUR NEGATIVE 01/11/2024 2041   KETONESUR NEGATIVE 01/11/2024 2041   PROTEINUR 30 (A) 01/11/2024 2041   NITRITE NEGATIVE 01/11/2024 2041   LEUKOCYTESUR LARGE (A) 01/11/2024 2041    FURTHER DISCHARGE INSTRUCTIONS:   Get Medicines reviewed and adjusted: Please take all your medications with you for your next visit with your Primary MD   Laboratory/radiological data: Please request your Primary MD to go over all hospital tests and procedure/radiological results at the follow up, please ask your Primary MD to get all Hospital records sent to his/her office.   In some  cases, they will be blood work, cultures and biopsy results pending at the time of your discharge. Please request that your primary care M.D. goes through all the records of your hospital data and follows up on these results.   Also Note the following: If you experience worsening of your admission symptoms, develop shortness of breath, life threatening emergency, suicidal or homicidal thoughts you must seek medical attention immediately by calling 911 or calling your MD immediately  if symptoms less severe.   You must read complete instructions/literature along with all the possible adverse reactions/side effects for all the Medicines you take and that have been prescribed to you. Take any new Medicines after you have completely understood and accpet all the possible adverse reactions/side effects.    Do not drive when taking Pain medications or sleeping medications (Benzodaizepines)   Do not take more than prescribed Pain, Sleep and Anxiety Medications. It is not advisable to combine anxiety,sleep and pain medications without talking  with your primary care practitioner   Special Instructions: If you have smoked or chewed Tobacco  in the last 2 yrs please stop smoking, stop any regular Alcohol  and or any Recreational drug use.   Wear Seat belts while driving.   Please note: You were cared for by a hospitalist during your hospital stay. Once you are discharged, your primary care physician will handle any further medical issues. Please note that NO REFILLS for any discharge medications will be authorized once you are discharged, as it is imperative that you return to your primary care physician (or establish a relationship with a primary care physician if you do not have one) for your post hospital discharge needs so that they can reassess your need for medications and monitor your lab values.  Time coordinating discharge: 40 minutes  SIGNED:  Pamella Pert, MD, PhD 01/17/2024, 2:47 PM

## 2024-01-24 ENCOUNTER — Inpatient Hospital Stay: Payer: BC Managed Care – PPO | Admitting: Internal Medicine

## 2024-04-27 ENCOUNTER — Inpatient Hospital Stay (HOSPITAL_COMMUNITY)
Admission: EM | Admit: 2024-04-27 | Discharge: 2024-05-05 | DRG: 871 | Disposition: A | Discharge status: Home Health | Attending: Family Medicine | Admitting: Family Medicine

## 2024-04-27 ENCOUNTER — Emergency Department (HOSPITAL_COMMUNITY)

## 2024-04-27 ENCOUNTER — Other Ambulatory Visit: Payer: Self-pay

## 2024-04-27 ENCOUNTER — Inpatient Hospital Stay (HOSPITAL_COMMUNITY)

## 2024-04-27 DIAGNOSIS — I214 Non-ST elevation (NSTEMI) myocardial infarction: Secondary | ICD-10-CM | POA: Diagnosis present

## 2024-04-27 DIAGNOSIS — R578 Other shock: Secondary | ICD-10-CM | POA: Diagnosis not present

## 2024-04-27 DIAGNOSIS — R7989 Other specified abnormal findings of blood chemistry: Secondary | ICD-10-CM | POA: Diagnosis not present

## 2024-04-27 DIAGNOSIS — K2100 Gastro-esophageal reflux disease with esophagitis, without bleeding: Secondary | ICD-10-CM | POA: Diagnosis present

## 2024-04-27 DIAGNOSIS — D631 Anemia in chronic kidney disease: Secondary | ICD-10-CM | POA: Diagnosis present

## 2024-04-27 DIAGNOSIS — E1065 Type 1 diabetes mellitus with hyperglycemia: Secondary | ICD-10-CM | POA: Diagnosis present

## 2024-04-27 DIAGNOSIS — Z66 Do not resuscitate: Secondary | ICD-10-CM | POA: Diagnosis present

## 2024-04-27 DIAGNOSIS — Z79890 Hormone replacement therapy (postmenopausal): Secondary | ICD-10-CM

## 2024-04-27 DIAGNOSIS — Z681 Body mass index (BMI) 19 or less, adult: Secondary | ICD-10-CM | POA: Diagnosis not present

## 2024-04-27 DIAGNOSIS — K8050 Calculus of bile duct without cholangitis or cholecystitis without obstruction: Secondary | ICD-10-CM

## 2024-04-27 DIAGNOSIS — I2510 Atherosclerotic heart disease of native coronary artery without angina pectoris: Secondary | ICD-10-CM | POA: Diagnosis present

## 2024-04-27 DIAGNOSIS — Z7952 Long term (current) use of systemic steroids: Secondary | ICD-10-CM

## 2024-04-27 DIAGNOSIS — J159 Unspecified bacterial pneumonia: Secondary | ICD-10-CM | POA: Diagnosis present

## 2024-04-27 DIAGNOSIS — I129 Hypertensive chronic kidney disease with stage 1 through stage 4 chronic kidney disease, or unspecified chronic kidney disease: Secondary | ICD-10-CM | POA: Diagnosis present

## 2024-04-27 DIAGNOSIS — E441 Mild protein-calorie malnutrition: Secondary | ICD-10-CM | POA: Diagnosis present

## 2024-04-27 DIAGNOSIS — K819 Cholecystitis, unspecified: Secondary | ICD-10-CM

## 2024-04-27 DIAGNOSIS — K5900 Constipation, unspecified: Secondary | ICD-10-CM | POA: Diagnosis present

## 2024-04-27 DIAGNOSIS — E1022 Type 1 diabetes mellitus with diabetic chronic kidney disease: Secondary | ICD-10-CM | POA: Diagnosis present

## 2024-04-27 DIAGNOSIS — Z79624 Long term (current) use of inhibitors of nucleotide synthesis: Secondary | ICD-10-CM

## 2024-04-27 DIAGNOSIS — N184 Chronic kidney disease, stage 4 (severe): Secondary | ICD-10-CM | POA: Diagnosis present

## 2024-04-27 DIAGNOSIS — G9341 Metabolic encephalopathy: Secondary | ICD-10-CM | POA: Diagnosis present

## 2024-04-27 DIAGNOSIS — D84821 Immunodeficiency due to drugs: Secondary | ICD-10-CM | POA: Diagnosis present

## 2024-04-27 DIAGNOSIS — I959 Hypotension, unspecified: Principal | ICD-10-CM

## 2024-04-27 DIAGNOSIS — Z9483 Pancreas transplant status: Secondary | ICD-10-CM

## 2024-04-27 DIAGNOSIS — Z7189 Other specified counseling: Secondary | ICD-10-CM | POA: Diagnosis not present

## 2024-04-27 DIAGNOSIS — K219 Gastro-esophageal reflux disease without esophagitis: Secondary | ICD-10-CM | POA: Diagnosis not present

## 2024-04-27 DIAGNOSIS — R339 Retention of urine, unspecified: Secondary | ICD-10-CM | POA: Diagnosis present

## 2024-04-27 DIAGNOSIS — K8060 Calculus of gallbladder and bile duct with cholecystitis, unspecified, without obstruction: Secondary | ICD-10-CM | POA: Diagnosis present

## 2024-04-27 DIAGNOSIS — H9190 Unspecified hearing loss, unspecified ear: Secondary | ICD-10-CM | POA: Diagnosis present

## 2024-04-27 DIAGNOSIS — D7589 Other specified diseases of blood and blood-forming organs: Secondary | ICD-10-CM | POA: Diagnosis present

## 2024-04-27 DIAGNOSIS — E109 Type 1 diabetes mellitus without complications: Secondary | ICD-10-CM

## 2024-04-27 DIAGNOSIS — Z79899 Other long term (current) drug therapy: Secondary | ICD-10-CM

## 2024-04-27 DIAGNOSIS — Y830 Surgical operation with transplant of whole organ as the cause of abnormal reaction of the patient, or of later complication, without mention of misadventure at the time of the procedure: Secondary | ICD-10-CM | POA: Diagnosis present

## 2024-04-27 DIAGNOSIS — T8619 Other complication of kidney transplant: Secondary | ICD-10-CM | POA: Diagnosis present

## 2024-04-27 DIAGNOSIS — L89153 Pressure ulcer of sacral region, stage 3: Secondary | ICD-10-CM | POA: Diagnosis present

## 2024-04-27 DIAGNOSIS — R6521 Severe sepsis with septic shock: Secondary | ICD-10-CM | POA: Diagnosis present

## 2024-04-27 DIAGNOSIS — E8721 Acute metabolic acidosis: Secondary | ICD-10-CM | POA: Diagnosis present

## 2024-04-27 DIAGNOSIS — A419 Sepsis, unspecified organism: Secondary | ICD-10-CM | POA: Diagnosis present

## 2024-04-27 DIAGNOSIS — N179 Acute kidney failure, unspecified: Secondary | ICD-10-CM

## 2024-04-27 DIAGNOSIS — Z89511 Acquired absence of right leg below knee: Secondary | ICD-10-CM | POA: Diagnosis not present

## 2024-04-27 DIAGNOSIS — N170 Acute kidney failure with tubular necrosis: Secondary | ICD-10-CM | POA: Diagnosis present

## 2024-04-27 DIAGNOSIS — R69 Illness, unspecified: Secondary | ICD-10-CM

## 2024-04-27 DIAGNOSIS — K529 Noninfective gastroenteritis and colitis, unspecified: Secondary | ICD-10-CM | POA: Diagnosis present

## 2024-04-27 DIAGNOSIS — E875 Hyperkalemia: Secondary | ICD-10-CM | POA: Diagnosis present

## 2024-04-27 DIAGNOSIS — Z8249 Family history of ischemic heart disease and other diseases of the circulatory system: Secondary | ICD-10-CM

## 2024-04-27 DIAGNOSIS — E876 Hypokalemia: Secondary | ICD-10-CM | POA: Diagnosis present

## 2024-04-27 DIAGNOSIS — E1040 Type 1 diabetes mellitus with diabetic neuropathy, unspecified: Secondary | ICD-10-CM | POA: Diagnosis present

## 2024-04-27 DIAGNOSIS — E872 Acidosis, unspecified: Secondary | ICD-10-CM | POA: Diagnosis not present

## 2024-04-27 DIAGNOSIS — D539 Nutritional anemia, unspecified: Secondary | ICD-10-CM | POA: Diagnosis present

## 2024-04-27 DIAGNOSIS — K8309 Other cholangitis: Secondary | ICD-10-CM | POA: Diagnosis not present

## 2024-04-27 DIAGNOSIS — Z9641 Presence of insulin pump (external) (internal): Secondary | ICD-10-CM | POA: Diagnosis present

## 2024-04-27 DIAGNOSIS — E039 Hypothyroidism, unspecified: Secondary | ICD-10-CM | POA: Diagnosis present

## 2024-04-27 LAB — MAGNESIUM: Magnesium: 2.3 mg/dL (ref 1.7–2.4)

## 2024-04-27 LAB — COMPREHENSIVE METABOLIC PANEL WITH GFR
ALT: 15 U/L (ref 0–44)
AST: 25 U/L (ref 15–41)
Albumin: 3.2 g/dL — ABNORMAL LOW (ref 3.5–5.0)
Alkaline Phosphatase: 187 U/L — ABNORMAL HIGH (ref 38–126)
Anion gap: 13 (ref 5–15)
BUN: 71 mg/dL — ABNORMAL HIGH (ref 8–23)
CO2: 12 mmol/L — ABNORMAL LOW (ref 22–32)
Calcium: 9.8 mg/dL (ref 8.9–10.3)
Chloride: 109 mmol/L (ref 98–111)
Creatinine, Ser: 2.5 mg/dL — ABNORMAL HIGH (ref 0.44–1.00)
GFR, Estimated: 21 mL/min — ABNORMAL LOW (ref >60)
Glucose, Bld: 200 mg/dL — ABNORMAL HIGH (ref 70–99)
Potassium: 5.2 mmol/L — ABNORMAL HIGH (ref 3.5–5.1)
Sodium: 134 mmol/L — ABNORMAL LOW (ref 135–145)
Total Bilirubin: 0.7 mg/dL (ref 0.0–1.2)
Total Protein: 6.7 g/dL (ref 6.5–8.1)

## 2024-04-27 LAB — CBC
HCT: 35.9 % — ABNORMAL LOW (ref 36.0–46.0)
Hemoglobin: 11.4 g/dL — ABNORMAL LOW (ref 12.0–15.0)
MCH: 32.7 pg (ref 26.0–34.0)
MCHC: 31.8 g/dL (ref 30.0–36.0)
MCV: 102.9 fL — ABNORMAL HIGH (ref 80.0–100.0)
Platelets: 276 10*3/uL (ref 150–400)
RBC: 3.49 MIL/uL — ABNORMAL LOW (ref 3.87–5.11)
RDW: 13.5 % (ref 11.5–15.5)
WBC: 6.5 10*3/uL (ref 4.0–10.5)
nRBC: 0 % (ref 0.0–0.2)

## 2024-04-27 LAB — TROPONIN I (HIGH SENSITIVITY): Troponin I (High Sensitivity): 7 ng/L (ref <18)

## 2024-04-27 LAB — PHOSPHORUS: Phosphorus: 4.2 mg/dL (ref 2.5–4.6)

## 2024-04-27 LAB — I-STAT CHEM 8, ED
BUN: 91 mg/dL — ABNORMAL HIGH (ref 8–23)
Calcium, Ion: 1.23 mmol/L (ref 1.15–1.40)
Chloride: 116 mmol/L — ABNORMAL HIGH (ref 98–111)
Creatinine, Ser: 2.7 mg/dL — ABNORMAL HIGH (ref 0.44–1.00)
Glucose, Bld: 199 mg/dL — ABNORMAL HIGH (ref 70–99)
HCT: 37 % (ref 36.0–46.0)
Hemoglobin: 12.6 g/dL (ref 12.0–15.0)
Potassium: 7.1 mmol/L (ref 3.5–5.1)
Sodium: 133 mmol/L — ABNORMAL LOW (ref 135–145)
TCO2: 13 mmol/L — ABNORMAL LOW (ref 22–32)

## 2024-04-27 LAB — CBG MONITORING, ED
Glucose-Capillary: 194 mg/dL — ABNORMAL HIGH (ref 70–99)
Glucose-Capillary: 199 mg/dL — ABNORMAL HIGH (ref 70–99)
Glucose-Capillary: 271 mg/dL — ABNORMAL HIGH (ref 70–99)

## 2024-04-27 LAB — LIPASE, BLOOD: Lipase: 43 U/L (ref 11–51)

## 2024-04-27 MED ORDER — HYDROCORTISONE SOD SUC (PF) 100 MG IJ SOLR
50.0000 mg | Freq: Four times a day (QID) | INTRAMUSCULAR | Status: DC
  Administered 2024-04-27 – 2024-04-29 (×6): 50 mg via INTRAVENOUS
  Filled 2024-04-27 (×6): qty 2

## 2024-04-27 MED ORDER — LACTATED RINGERS IV SOLN: INTRAVENOUS | Status: DC

## 2024-04-27 MED ORDER — LEVOTHYROXINE SODIUM 100 MCG PO TABS
100.0000 ug | ORAL_TABLET | Freq: Every day | ORAL | Status: DC
  Administered 2024-04-29 – 2024-05-05 (×7): 100 ug via ORAL
  Filled 2024-04-27 (×8): qty 1

## 2024-04-27 MED ORDER — INSULIN ASPART 100 UNIT/ML IJ SOLN
0.0000 [IU] | INTRAMUSCULAR | Status: DC
  Administered 2024-04-27: 5 [IU] via SUBCUTANEOUS
  Administered 2024-04-28: 7 [IU] via SUBCUTANEOUS
  Administered 2024-04-28: 5 [IU] via SUBCUTANEOUS
  Administered 2024-04-28 (×2): 7 [IU] via SUBCUTANEOUS
  Administered 2024-04-29: 3 [IU] via SUBCUTANEOUS
  Administered 2024-04-29 (×2): 2 [IU] via SUBCUTANEOUS
  Administered 2024-04-29: 9 [IU] via SUBCUTANEOUS
  Administered 2024-04-29 – 2024-04-30 (×3): 2 [IU] via SUBCUTANEOUS
  Filled 2024-04-27: qty 0.09

## 2024-04-27 MED ORDER — HYDROMORPHONE HCL 1 MG/ML IJ SOLN: 0.5000 mg | INTRAMUSCULAR | Status: DC | PRN

## 2024-04-27 MED ORDER — PANTOPRAZOLE SODIUM 40 MG IV SOLR
40.0000 mg | INTRAVENOUS | Status: DC
  Administered 2024-04-28 – 2024-05-02 (×5): 40 mg via INTRAVENOUS
  Filled 2024-04-27 (×5): qty 10

## 2024-04-27 MED ORDER — POLYETHYLENE GLYCOL 3350 17 G PO PACK: 17.0000 g | PACK | Freq: Every day | ORAL | Status: DC | PRN

## 2024-04-27 MED ORDER — HYDROMORPHONE HCL 1 MG/ML IJ SOLN
1.0000 mg | INTRAMUSCULAR | Status: DC | PRN
  Administered 2024-04-28: 1 mg via INTRAVENOUS
  Filled 2024-04-27: qty 1

## 2024-04-27 MED ORDER — METRONIDAZOLE 500 MG/100ML IV SOLN
500.0000 mg | Freq: Two times a day (BID) | INTRAVENOUS | Status: DC
  Administered 2024-04-28: 500 mg via INTRAVENOUS
  Filled 2024-04-27: qty 100

## 2024-04-27 MED ORDER — NOREPINEPHRINE 4 MG/250ML-% IV SOLN
0.0000 ug/min | INTRAVENOUS | Status: DC
  Administered 2024-04-27 – 2024-04-28 (×2): 8 ug/min via INTRAVENOUS
  Administered 2024-04-28: 2 ug/min via INTRAVENOUS
  Filled 2024-04-27: qty 250

## 2024-04-27 MED ORDER — ONDANSETRON HCL 4 MG/2ML IJ SOLN
4.0000 mg | Freq: Once | INTRAMUSCULAR | Status: AC
  Administered 2024-04-27: 4 mg via INTRAVENOUS
  Filled 2024-04-27: qty 2

## 2024-04-27 MED ORDER — IPRATROPIUM-ALBUTEROL 0.5-2.5 (3) MG/3ML IN SOLN
3.0000 mL | Freq: Four times a day (QID) | RESPIRATORY_TRACT | Status: DC
  Administered 2024-04-27: 3 mL via RESPIRATORY_TRACT
  Filled 2024-04-27: qty 3

## 2024-04-27 MED ORDER — NOREPINEPHRINE 4 MG/250ML-% IV SOLN
INTRAVENOUS | Status: AC
  Administered 2024-04-27: 2.5 ug/min via INTRAVENOUS
  Filled 2024-04-27: qty 250

## 2024-04-27 MED ORDER — IPRATROPIUM-ALBUTEROL 0.5-2.5 (3) MG/3ML IN SOLN: 3.0000 mL | Freq: Four times a day (QID) | RESPIRATORY_TRACT | Status: DC | PRN

## 2024-04-27 MED ORDER — DOCUSATE SODIUM 100 MG PO CAPS: 100.0000 mg | ORAL_CAPSULE | Freq: Two times a day (BID) | ORAL | Status: DC | PRN

## 2024-04-27 MED ORDER — ACETAMINOPHEN 325 MG PO TABS
650.0000 mg | ORAL_TABLET | Freq: Four times a day (QID) | ORAL | Status: DC | PRN
  Administered 2024-05-03: 650 mg via ORAL
  Filled 2024-04-27: qty 2

## 2024-04-27 MED ORDER — PANTOPRAZOLE SODIUM 40 MG IV SOLR
40.0000 mg | Freq: Once | INTRAVENOUS | Status: AC
  Administered 2024-04-27: 40 mg via INTRAVENOUS
  Filled 2024-04-27: qty 10

## 2024-04-27 MED ORDER — SODIUM CHLORIDE 0.9 % IV SOLN
500.0000 mg | INTRAVENOUS | Status: AC
  Administered 2024-04-27 – 2024-04-29 (×3): 500 mg via INTRAVENOUS
  Filled 2024-04-27 (×3): qty 5

## 2024-04-27 MED ORDER — INSULIN ASPART 100 UNIT/ML IV SOLN
5.0000 [IU] | Freq: Once | INTRAVENOUS | Status: DC
  Filled 2024-04-27: qty 0.05

## 2024-04-27 MED ORDER — DEXTROSE 50 % IV SOLN
1.0000 | Freq: Once | INTRAVENOUS | Status: DC
  Filled 2024-04-27: qty 50

## 2024-04-27 MED ORDER — LACTATED RINGERS IV BOLUS
1000.0000 mL | Freq: Once | INTRAVENOUS | Status: AC
  Administered 2024-04-27: 1000 mL via INTRAVENOUS

## 2024-04-27 MED ORDER — ONDANSETRON HCL 4 MG/2ML IJ SOLN
4.0000 mg | Freq: Four times a day (QID) | INTRAMUSCULAR | Status: DC | PRN
  Administered 2024-04-28 – 2024-05-03 (×4): 4 mg via INTRAVENOUS
  Filled 2024-04-27 (×4): qty 2

## 2024-04-27 MED ORDER — CALCIUM GLUCONATE 10 % IV SOLN
1.0000 g | Freq: Once | INTRAVENOUS | Status: AC
  Administered 2024-04-27: 1 g via INTRAVENOUS
  Filled 2024-04-27: qty 10

## 2024-04-27 MED ORDER — SODIUM BICARBONATE 8.4 % IV SOLN
Freq: Once | INTRAVENOUS | Status: AC
  Filled 2024-04-27: qty 150

## 2024-04-27 MED ORDER — SODIUM CHLORIDE 0.9 % IV SOLN
1.0000 g | INTRAVENOUS | Status: DC
  Administered 2024-04-28: 1 g via INTRAVENOUS
  Filled 2024-04-27: qty 10

## 2024-04-27 MED ORDER — PIPERACILLIN-TAZOBACTAM 3.375 G IVPB 30 MIN
3.3750 g | Freq: Once | INTRAVENOUS | Status: AC
  Administered 2024-04-27: 3.375 g via INTRAVENOUS
  Filled 2024-04-27: qty 50

## 2024-04-27 MED ORDER — ALBUTEROL SULFATE (2.5 MG/3ML) 0.083% IN NEBU
10.0000 mg | INHALATION_SOLUTION | Freq: Once | RESPIRATORY_TRACT | Status: AC
  Administered 2024-04-27: 10 mg via RESPIRATORY_TRACT
  Filled 2024-04-27: qty 12

## 2024-04-27 MED ORDER — HYDROMORPHONE HCL 1 MG/ML IJ SOLN
0.5000 mg | Freq: Once | INTRAMUSCULAR | Status: AC
  Administered 2024-04-27: 0.5 mg via INTRAVENOUS
  Filled 2024-04-27: qty 1

## 2024-04-27 MED ORDER — NOREPINEPHRINE 4 MG/250ML-% IV SOLN: 4.0000 ug/min | INTRAVENOUS | Status: DC

## 2024-04-27 MED ORDER — HEPARIN SODIUM (PORCINE) 5000 UNIT/ML IJ SOLN
5000.0000 [IU] | Freq: Three times a day (TID) | INTRAMUSCULAR | Status: DC
  Administered 2024-04-27 – 2024-04-28 (×2): 5000 [IU] via SUBCUTANEOUS
  Filled 2024-04-27 (×2): qty 1

## 2024-04-27 NOTE — H&P (Signed)
 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 The patient was to a routine doctor's appointment with her husband and after getting out of the appointment and getting in the car planning to go eat, patient suddenly became intensely nauseated with severe epigastric pain. She denies radiation to the back. She vomited 4 times of brown fluid. No diarrhea, f/c/r, CP, SOB, DOE, cough, wheezing, rash, flank pain, or dysuria. Her husband brought her to ED and BP was 67/46 in triage. She feels lightheaded. Her husband reports that she seemed to get kind of lethargic briefly. Patient denies any similar episodes. No smoking, alcohol  drinking, or illicit drug use.   Pertinent  Medical History  Three times renal transplant (last one in 2011), DM-1 (on insulin  pump), hypothyroidism, HTN, GERD, esophagitis, hiatal hernia, anemia of chronic illness  Significant Hospital Events: Including procedures, antibiotic start and stop dates in addition to other pertinent events   ED: Rx for hyperkalemia with Albuterol , Ca, and IV insulin . Received 3 L LR bolus. Started on Zosyn . Started Levophed  @ 10 mcg/min due to persistent hypotension   Interim History / Subjective:    Objective   Blood pressure (!) 82/44, pulse 94, resp. rate 19, SpO2 98%.       No intake or output data in the 24 hours ending 04/27/24 2110 There were no vitals filed for this visit.  Examination: General: alert, oriented x4, and comfortable. On RA. SpO2 98%  HENT: right pupil is fixed due to cataract surgery. Left cornea in opacified. Normal pharynx and oral mucosa. No LNE or thyromegaly. No  JVD Lungs: symmetrical air entry bilaterally. No crackles or wheezing Cardiovascular: NL S1/S2. No m/g/r Abdomen: no distension. Mild upper abd tenderness. No rigidity or rebound.  Extremities: no edema. 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 Left thigh small superficial ulcer with a scab.   Neuro: nonfocal    Resolved Hospital Problem list     Assessment & Plan:  Septic shock due to CBD stone (cholecystitis, cholangitis). Other D.dx: CAP, and enteritis -Bcx2 -U. Ags -Sputum Cx -Duoneb -Abx: Rocephin , Zithromax , Flagyl  -I/S -Consult GI -MRCP -Levophed  for MAP>65 mmHg -Lactic acid -IVF: LR and bicarb drip -I/O chart -am labs -Echo   Constipation -Laxatives  ?adrenal insufficiency: on Prednisone  5 mg daily for 20 yrs -Solu-cortef   HyperK -bicarb drip -Duoneb  AKI -IVF -Am labs -Hold ACEI  Renal transplant (last one in 2011) -Hold immunosuppressant med for now -Monitor renal panel   DM-1 (on insulin  pump) -ISS -Glycemic control 140-180 mg/dl -[id number]   Hypothyroidism -Resume home med  HTN -Hold ACEI  GERD, esophagitis, hiatal hernia -PPI -aspiration precautions  Anemia of chronic illness -monitor CBC  Best Practice (right click and "Reselect all SmartList Selections" daily)   Diet/type: NPO w/ oral meds DVT prophylaxis prophylactic heparin   Pressure ulcer(s): N/A GI prophylaxis: PPI Lines: Central line Foley:  N/A Code Status:  full code Last date of multidisciplinary goals of care discussion []   Labs   CBC: Recent Labs  Lab 04/27/24 1848 04/27/24 1850  WBC  --  6.5  HGB 12.6 11.4*  HCT 37.0 35.9*  MCV  --  102.9*  PLT  --  276    Basic Metabolic Panel: Recent Labs  Lab 04/27/24 1848 04/27/24 1850 04/27/24 1855  NA 133* 134*  --   K 7.1* 5.2*  --   CL 116* 109  --   CO2  --  12*  --   GLUCOSE 199* 200*  --   BUN 91* 71*  --   CREATININE 2.70* 2.50*  --   CALCIUM   --  9.8  --   MG  --   --  2.3   PHOS  --   --  4.2   GFR: CrCl cannot be calculated (Unknown ideal weight.). Recent Labs  Lab 04/27/24 1850  WBC 6.5    Liver Function Tests: Recent Labs  Lab 04/27/24 1850  AST 25  ALT 15  ALKPHOS 187*  BILITOT 0.7  PROT 6.7  ALBUMIN  3.2*   Recent Labs  Lab 04/27/24 1850  LIPASE 43   No results for input(s): "AMMONIA" in the last 168 hours.  ABG    Component Value Date/Time   HCO3 21.3 11/08/2020 1914   TCO2 13 (L) 04/27/2024 1848   ACIDBASEDEF 2.0 11/08/2020 1914   O2SAT 62.8 11/08/2020 1914     Coagulation Profile: No results for input(s): "INR", "PROTIME" in the last 168 hours.  Cardiac Enzymes: No results for input(s): "CKTOTAL", "CKMB", "CKMBINDEX", "TROPONINI" in the last 168 hours.  HbA1C: Hgb A1c MFr Bld  Date/Time Value Ref Range Status  01/09/2024 10:58 PM 7.2 (H) 4.8 - 5.6 % Final    Comment:    (NOTE) Pre diabetes:          5.7%-6.4%  Diabetes:              >6.4%  Glycemic control for   <7.0% adults with diabetes   12/18/2021 03:56 AM 6.3 (H) 4.8 - 5.6 % Final    Comment:    (NOTE) Pre diabetes:          5.7%-6.4%  Diabetes:              >6.4%  Glycemic control for   <7.0% adults with diabetes     CBG: Recent Labs  Lab 04/27/24 1838  GLUCAP 194*    Review of Systems:   Review of Systems  Constitutional:  Positive for malaise/fatigue. Negative for chills, diaphoresis and fever.  HENT:  Negative for congestion, sinus pain and sore throat.   Respiratory:  Positive for shortness of breath and wheezing. Negative for cough, hemoptysis, sputum production and stridor.   Cardiovascular:  Negative for chest pain, palpitations and leg swelling.  Gastrointestinal:  Positive for abdominal pain, constipation, nausea and vomiting. Negative for blood in stool, diarrhea, heartburn and melena.  Genitourinary:  Positive for dysuria. Negative for urgency.  Musculoskeletal:  Negative for back pain and myalgias.  Skin:  Negative for itching  and rash.     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   Surgical History:   Past Surgical History:  Procedure Laterality Date   AMPUTATION Right 10/01/2020   Procedure: RIGHT BELOW KNEE AMPUTATION;  Surgeon: Timothy Ford, MD;  Location: [facility] OR;  Service: Orthopedics;  Laterality: Right;   BLADDER SURGERY     ESOPHAGOGASTRODUODENOSCOPY (EGD) WITH PROPOFOL  N/A 11/10/2020   Procedure: ESOPHAGOGASTRODUODENOSCOPY (EGD) WITH PROPOFOL ;  Surgeon: Tobin Forts, MD;  Location: [facility] ENDOSCOPY;  Service: Endoscopy;  Laterality: N/A;   IR GASTROSTOMY TUBE REMOVAL  03/25/2021   KIDNEY TRANSPLANT     LEG SURGERY     tib-fib fx , ORIF     Social History:   reports that she has never smoked. She has never used smokeless tobacco. She reports that she does not drink alcohol  and does not use drugs.   Family History:  Her family history includes Hypertension in her father and mother.   Allergies No Known Allergies   Home Medications  Prior to Admission medications   Medication Sig Start Date End Date Taking? Authorizing Provider  azaTHIOprine  (IMURAN ) 50 MG tablet Take 1 tablet (50 mg total) by mouth daily. 03/02/21   Barbee Lew, MD  cycloSPORINE  modified (NEORAL ) 25 MG capsule Take 75 mg by mouth in the morning and at bedtime. 12/13/21   [provider]  levothyroxine  (SYNTHROID ) 100 MCG tablet Take 100 mcg by mouth daily before breakfast.    [provider]  NOVOLOG  100 UNIT/ML injection  Inject 100 Units into the skin See admin instructions. Per Omnipod insulin  pump    [provider]  predniSONE  (DELTASONE ) 5 MG tablet Take 1 tablet (5 mg total) by mouth daily with breakfast. 03/02/21   Barbee Lew, MD  sucralfate  (CARAFATE ) 1 GM/10ML suspension Take 10 mLs (1 g total) by mouth 4 (four) times daily -  with meals and at bedtime. 12/22/21 01/09/24  Armenta Landau, MD     Critical care time: 60 min    Madelynn Schilder, MD [facility] Pager: see AMION

## 2024-04-27 NOTE — Progress Notes (Incomplete)
 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  Patient presents with septic shock secondary to cholecystitis and likely ascending cholangitis in an immunocompromised host.  Patient is tachycardic and normotensive on norepinephrine  infusion.  Saturating 98% on room air.  Results show elevated creatinine, hyperglycemia, none anion gap metabolic acidosis and macrocytic anemia.  Radiograph reviewed with left IJ central line okay to use.  Numerous abnormalities on CT abdomen with evidence of potential choledocholithiasis, cholecystitis, enteritis, and potentially esophagitis.  eICU Interventions  Maintain broad antibiotics including ceftriaxone , azithromycin , and metronidazole .  Stress dose steroids in the setting of chronic steroid use  Sliding scale insulin , consider initiating some long-acting insulin  in the setting of type 1 diabetes.  Would benefit from assessing her insulin  pump     Intervention Category Evaluation Type: New Patient Evaluation  [name] 04/27/2024, 11:59 PM

## 2024-04-27 NOTE — ED Notes (Signed)
 NS bolus started at 999 ml/h

## 2024-04-27 NOTE — ED Notes (Signed)
 Provider at bedside to perform ultrasound of heart and aorta.

## 2024-04-27 NOTE — Procedures (Signed)
 Central Venous Catheter Insertion Procedure Note  [name]  [id number]  03-24-1961  Date:04/27/24  Time:9:44 PM   Provider Performing:Stormi Vandevelde Macario Savin   Procedure: Insertion of Non-tunneled Central Venous [phone]) with US  guidance ([id number])   Indication(s) Medication administration  Consent Risks of the procedure as well as the alternatives and risks of each were explained to the patient and/or caregiver.  Consent for the procedure was obtained and is signed in the bedside chart  Anesthesia Topical only with 1% lidocaine    Timeout Verified patient identification, verified procedure, site/side was marked, verified correct patient position, special equipment/implants available, medications/allergies/relevant history reviewed, required imaging and test results available.  Sterile Technique Maximal sterile technique including full sterile barrier drape, hand hygiene, sterile gown, sterile gloves, mask, hair covering, sterile ultrasound probe cover (if used).  Procedure Description Area of catheter insertion was cleaned with chlorhexidine  and draped in sterile fashion.  With real-time ultrasound guidance a central venous catheter was placed into the left internal jugular vein. Nonpulsatile blood flow and easy flushing noted in all ports.  The catheter was sutured in place and sterile dressing applied.  Complications/Tolerance None; patient tolerated the procedure well. Chest X-ray is ordered to verify placement for internal jugular or subclavian cannulation.   Chest x-ray is not ordered for femoral cannulation.  EBL Minimal  Specimen(s) None

## 2024-04-27 NOTE — ED Provider Notes (Addendum)
 [place] EMERGENCY DEPARTMENT AT [facility] Provider Note   CSN: [id number] Arrival date & time: 04/27/24  1808     History  Chief Complaint  Patient presents with   Hypotension   Abdominal Pain   Nausea    [name] is a 63 y.o. female.  HPI Patient has past medical history including renal transplant in 2009 done at the [facility].  Patient is type I diabetic with long-term insulin  use.  Patient has prior history of esophagitis and GERD.  The patient had been to a routine doctor's appointment with her husband and after getting out of the appointment and getting in the car planning to go eat, patient suddenly became intensely nauseated had severe epigastric pain.  She denies radiation to the back.  Her husband brought her to the emergency department and blood pressure was 67/46 in triage.  Patient has several episodes of vomiting nonbloody.  Patient denies any symptoms leading up to this episode.  She does feel lightheaded.  Her husband reports that she seemed to get kind of lethargic briefly as she was in severe pain.  Patient denies any similar episodes.    Home Medications Prior to Admission medications   Medication Sig Start Date End Date Taking? Authorizing Provider  azaTHIOprine  (IMURAN ) 50 MG tablet Take 1 tablet (50 mg total) by mouth daily. 03/02/21   Barbee Lew, MD  cycloSPORINE  modified (NEORAL ) 25 MG capsule Take 75 mg by mouth in the morning and at bedtime. 12/13/21   [provider]  levothyroxine  (SYNTHROID ) 100 MCG tablet Take 100 mcg by mouth daily before breakfast.    [provider]  NOVOLOG  100 UNIT/ML injection Inject 100 Units into the skin See admin instructions. Per Omnipod insulin  pump    [provider]  predniSONE  (DELTASONE ) 5 MG tablet Take 1 tablet (5 mg total) by mouth daily with breakfast. 03/02/21   Barbee Lew, MD  sucralfate  (CARAFATE ) 1 GM/10ML suspension Take 10 mLs (1 g total) by mouth 4  (four) times daily -  with meals and at bedtime. 12/22/21 01/09/24  Armenta Landau, MD      Allergies    Patient has no known allergies.    Review of Systems   Review of Systems  Physical Exam Updated Vital Signs BP (!) 97/39   Pulse 95   Resp 15   SpO2 100%  Physical Exam Constitutional:      Comments: Patient is ill in appearance and appears to be in severe pain.  Pale.  HENT:     Mouth/Throat:     Pharynx: Oropharynx is clear.  Eyes:     Extraocular Movements: Extraocular movements intact.     Comments: Patient has significant scar tissue over the cornea on the left.  Cardiovascular:     Comments: Borderline tachycardia.  Distant heart sounds.  No appreciable rub murmur gallop. Pulmonary:     Comments: Lungs are grossly clear. Abdominal:     Comments: Patient endorses significant pain to palpation in the epigastrium.  Musculoskeletal:     Comments: Patient has BKA on the right.  Left lower extremity no peripheral edema calf soft and pliable  Skin:    Comments: Patient is pale in appearance.  Neurological:     Comments: Patient is slightly groggy but is situationally oriented and answering questions.  She appears generally very weak.  No focal deficits.     ED Results / Procedures / Treatments   Labs (all labs ordered  are listed, but only abnormal results are displayed) Labs Reviewed  COMPREHENSIVE METABOLIC PANEL WITH GFR - Abnormal; Notable for the following components:      Result Value   Sodium 134 (*)    Potassium 5.2 (*)    CO2 12 (*)    Glucose, Bld 200 (*)    BUN 71 (*)    Creatinine, Ser 2.50 (*)    Albumin  3.2 (*)    Alkaline Phosphatase 187 (*)    GFR, Estimated 21 (*)    All other components within normal limits  CBC - Abnormal; Notable for the following components:   RBC 3.49 (*)    Hemoglobin 11.4 (*)    HCT 35.9 (*)    MCV 102.9 (*)    All other components within normal limits  I-STAT CHEM 8, ED - Abnormal; Notable for the following  components:   Sodium 133 (*)    Potassium 7.1 (*)    Chloride 116 (*)    BUN 91 (*)    Creatinine, Ser 2.70 (*)    Glucose, Bld 199 (*)    TCO2 13 (*)    All other components within normal limits  CBG MONITORING, ED - Abnormal; Notable for the following components:   Glucose-Capillary 194 (*)    All other components within normal limits  LIPASE, BLOOD  MAGNESIUM   PHOSPHORUS  URINALYSIS, ROUTINE W REFLEX MICROSCOPIC  TROPONIN I (HIGH SENSITIVITY)  TROPONIN I (HIGH SENSITIVITY)    EKG EKG Interpretation Date/Time:  Friday Apr 27 2024 18:39:07 EDT Ventricular Rate:  99 PR Interval:  163 QRS Duration:  93 QT Interval:  353 QTC Calculation: 453 R Axis:   133  Text Interpretation: Sinus rhythm Left posterior fascicular block Low voltage with right axis deviation ST elevation, consider inferior injury similar inferior elevation and q wave Confirmed by Wynetta Heckle [phone]) on 04/27/2024 6:46:40 PM  Radiology CT CHEST ABDOMEN PELVIS WO CONTRAST Result Date: 04/27/2024 CLINICAL DATA:  Sepsis EXAM: CT CHEST, ABDOMEN AND PELVIS WITHOUT CONTRAST TECHNIQUE: Multidetector CT imaging of the chest, abdomen and pelvis was performed following the standard protocol without IV contrast. RADIATION DOSE REDUCTION: This exam was performed according to the departmental dose-optimization program which includes automated exposure control, adjustment of the mA and/or kV according to patient size and/or use of iterative reconstruction technique. COMPARISON:  CT abdomen and pelvis 12/17/2021. FINDINGS: CT CHEST FINDINGS Cardiovascular: No significant vascular findings. The heart is mildly enlarged. No pericardial effusion. There are atherosclerotic calcifications of the aorta and coronary arteries. Mediastinum/Nodes: There is a small air-fluid level throughout the esophagus. A small hiatal hernia is present. There is questionable distal esophageal wall thickening. No enlarged lymph nodes are identified.  Visualized thyroid  gland is within normal limits. Lungs/Pleura: Patchy ground-glass and linear airspace opacities are seen in the bilateral lower lobes. There also some ground-glass opacities in the lingula. The lungs are otherwise clear. There is no pleural effusion or pneumothorax. Musculoskeletal: There is trace compression deformity the superior endplate T4 T8 and T11. No acute fracture lines are present. CT ABDOMEN PELVIS FINDINGS Hepatobiliary: Gallstones are present. There is minimal pericholecystic fluid. There is mild intrahepatic biliary ductal dilatation. Common bile duct appears dilated. Questionable choledocholithiasis measuring 9 mm image 2/49. No focal liver lesions are seen. Pancreas: Atrophic, unchanged. Not well evaluated on this noncontrast study. Questionable rounded hypodense lesion in the head of the pancreas image 2/58 measuring 1 cm, mildly increased in size from prior. Spleen: Normal in size without focal abnormality. Adrenals/Urinary  Tract: The adrenal glands are within normal limits. There is marked bilateral atrophy of the native kidneys, unchanged. There is a transplanted left lower quadrant kidney. There is minimal stranding surrounding the posterior aspect of the transplanted kidney, unchanged from prior. There is no hydronephrosis. There are cysts in the left kidney as seen on the prior study. There are few punctate calcifications in the left kidney which may be vascular, new from prior. The bladder is decompressed by Foley catheter. Stomach/Bowel: No dilated bowel loops are seen. There is a large amount of stool throughout the entire colon. The appendix is visualized and appears normal. There is some wall thickening and mesenteric edema involving small bowel loops in the left abdomen. The stomach is nondilated. Vascular/Lymphatic: There are severe atherosclerotic calcifications of the aorta and peripheral vessels. No evidence for aortic aneurysm. 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 There are multiple surgical clips along the pelvic sidewalls. No definitive enlarged lymph nodes identified. Reproductive: Uterus not definitely seen. Other: There is presacral edema and trace free fluid in the pelvis. There is scarring in the anterior abdominal wall. Musculoskeletal: The bones are osteopenic. There is trace compression deformity of the superior endplate of L1 which is chronic and unchanged. Degenerative changes affect the spine. Right hip arthroplasty is present. IMPRESSION: 1. Patchy ground-glass and linear airspace opacities in the bilateral lower lobes and lingula worrisome for multifocal pneumonia. 2. Small hiatal hernia with air-fluid level in the esophagus. Questionable distal esophageal wall thickening. Correlate clinically for esophagitis. 3. Cholelithiasis with minimal pericholecystic fluid. Mild intrahepatic and extrahepatic biliary ductal dilatation with questionable choledocholithiasis. Correlate clinically for cholecystitis/biliary obstruction. Consider further evaluation with MRCP or ERCP. 4. Questionable rounded hypodense lesion in the head of the pancreas measuring 1 cm, mildly increased in size from prior. Recommend further evaluation with MRI/MRCP. 5. Wall thickening and mesenteric edema involving small bowel loops in the left abdomen worrisome for enteritis including infectious, inflammatory and ischemic etiologies. 6. Large amount of stool throughout the colon compatible with constipation. Aortic Atherosclerosis (ICD10-I70.0). Electronically Signed   By: Tyron Gallon M.D.   On: 04/27/2024 20:18   DG Chest Port 1 View Result Date: 04/27/2024 CLINICAL DATA:  Epigastric pain. EXAM: PORTABLE CHEST 1 VIEW COMPARISON:  Chest radiograph dated 09/26/2020. FINDINGS: No focal consolidation, pleural effusion, or pneumothorax. The cardiac silhouette is within limits. Atherosclerotic calcification of the aorta. Osteopenia with  degenerative changes of the spine. No acute osseous pathology. IMPRESSION: No active disease. Electronically Signed   By: Angus Bark M.D.   On: 04/27/2024 19:08    Procedures Procedures   CRITICAL CARE Performed by: Wynetta Heckle   Total critical care time: 60 minutes  Critical care time was exclusive of separately billable procedures and treating other patients.  Critical care was necessary to treat or prevent imminent or life-threatening deterioration.  Critical care was time spent personally by me on the following activities: development of treatment plan with patient and/or surrogate as well as nursing, discussions with consultants, evaluation of patient's response to treatment, examination of patient, obtaining history from patient or surrogate, ordering and performing treatments and interventions, ordering and review of laboratory studies, ordering and review of radiographic studies, pulse oximetry and re-evaluation of patient's condition.  Medications Ordered in ED Medications  insulin  aspart (novoLOG ) injection 5 Units (0 Units Intravenous Hold 04/27/24 1902)    And  dextrose  50 % solution 50 mL (0 mLs Intravenous Hold 04/27/24 1902)  norepinephrine  (  LEVOPHED ) 4mg  in (0.016 mg/mL) premix infusion (9 mcg/min Intravenous Rate/Dose Change 04/27/24 2033)  lactated ringers  infusion (has no administration in time range)  piperacillin -tazobactam (ZOSYN ) IVPB 3.375 g (3.375 g Intravenous New Bag/Given 04/27/24 2026)  calcium  gluconate inj 10% (1 g) URGENT USE ONLY! (1 g Intravenous Given 04/27/24 1859)  albuterol  (PROVENTIL ) (2.5 MG/3ML) 0.083% nebulizer solution 10 mg (10 mg Nebulization Given 04/27/24 1900)  ondansetron  (ZOFRAN ) injection 4 mg (4 mg Intravenous Given 04/27/24 1911)  pantoprazole  (PROTONIX ) injection 40 mg (40 mg Intravenous Given 04/27/24 2025)  HYDROmorphone  (DILAUDID ) injection 0.5 mg (0.5 mg Intravenous Given 04/27/24 2021)  lactated ringers  bolus 1,000 mL (1,000 mLs  Intravenous New Bag/Given 04/27/24 2019)    ED Course/ Medical Decision Making/ A&P Clinical Course as of 04/27/24 2048  Fri Apr 27, 2024  1929 Comprehensive metabolic panel(!) [MP]    Clinical Course User Index [MP] Wynetta Heckle, MD                                 Medical Decision Making Amount and/or Complexity of Data Reviewed Labs: ordered. Decision-making details documented in ED Course. Radiology: ordered.  Risk OTC drugs. Prescription drug management. Decision regarding hospitalization.  Patient presents as outlined.  She is acutely ill and hypotensive on arrival.  Resuscitation initiated with fluid bolus of 2 L.  Patient's blood pressures were remaining 70s systolic after liter fluids, Levophed  initiated.  During this time Accu-Chek and EKG obtained.  CBG 199.  EKG reviewed by myself is consistent with prior tracings and does not show an acute STEMI pattern.  Patient pre-existing inferior ST elevations with Q waves not changed from previous.  I performed limited bedside ultrasound and did not appreciate any evident aortic aneurysm.  I-STAT obtained showed potassium at 7.1 sodium 133 BUN 91 creatinine 2.7.  With potassium 7.1 emergent treatment initiated with calcium  gluconate and albuterol  nebulizer therapy.  At this time determination made to proceed with CT scanning first noncontrast given patient's chronic renal insufficiency.  I reviewed this and the CT scanner.  Patient has some inflammatory changes and gallstones present.  She has significant calcification of the aorta with no appearance of an aneurysm.   Ordered Protonix , half milligram Dilaudid  and Zofran  for symptomatic treatment.  At this time with patient requiring Levophed  for blood pressure support, will require admission to intensivist service.  Will also consult gastroenterology for appearance of choledocholithiasis by radiology interpretation of CT scan.  Patient has been started on Zosyn .  Consult: 20: 34  intensivist for admission.  Consult: Dr. Finley Hugh gastroenterology.  She advises patient to get MRCP and she will do consult the morning.     Final Clinical Impression(s) / ED Diagnoses Final diagnoses:  Hypotension, unspecified hypotension type  Choledocholithiasis  Severe comorbid illness  Hyperkalemia    Rx / DC Orders ED Discharge Orders     None         Wynetta Heckle, MD 04/27/24 1610    Wynetta Heckle, MD 04/27/24 [phone]

## 2024-04-27 NOTE — ED Triage Notes (Signed)
 Pt brought in by spouse after just leaving a routine Dr appt. Pt became nauseated while in the car. Pt BP found to be 67/46 in triage.

## 2024-04-27 NOTE — Progress Notes (Addendum)
 eLink Physician-Brief Progress Note Patient Name: [name] DOB: [birth year] MRN: [id number]   Date of Service  04/27/2024  HPI/Events of Note  63 yr old female patient with three times renal transplant (last one in 2011), DM-1 (on insulin  pump), hypothyroidism, HTN, GERD, esophagitis, hiatal hernia, and anemia of chronic illness.  Patient presents with septic shock secondary to cholecystitis and likely ascending cholangitis in an immunocompromised host.  Patient is tachycardic and normotensive on norepinephrine  infusion.  Saturating 98% on room air.  Results show elevated creatinine, hyperglycemia, none anion gap metabolic acidosis and macrocytic anemia.  Radiograph reviewed with left IJ central line okay to use.  Numerous abnormalities on CT abdomen with evidence of potential choledocholithiasis, cholecystitis, enteritis, and potentially esophagitis.  eICU Interventions  Maintain broad antibiotics including ceftriaxone , azithromycin , and metronidazole .  Cultures pending.  Stress dose steroids in the setting of chronic steroid use.  Norepinephrine  to maintain MAP greater than 65.  Sliding scale insulin , consider initiating some long-acting insulin  in the setting of type 1 diabetes.  Would benefit from assessing her insulin  pump/total daily dose  Gastroenterology consulted, pending, MRCP pending  Maintain n.p.o. status DVT prophylaxis with heparin  GI prophylaxis not currently indicated, therapeutic pantoprazole  in place   0357 - Trop 350, likely stress-induced, continue monitoring.  Lactic downtrending  Intervention Category Evaluation Type: New Patient Evaluation  Tawfiq Favila 04/27/2024, 11:59 PM

## 2024-04-28 ENCOUNTER — Inpatient Hospital Stay (HOSPITAL_COMMUNITY)

## 2024-04-28 DIAGNOSIS — G9341 Metabolic encephalopathy: Secondary | ICD-10-CM | POA: Diagnosis not present

## 2024-04-28 DIAGNOSIS — E872 Acidosis, unspecified: Secondary | ICD-10-CM | POA: Diagnosis not present

## 2024-04-28 DIAGNOSIS — A419 Sepsis, unspecified organism: Secondary | ICD-10-CM | POA: Diagnosis not present

## 2024-04-28 DIAGNOSIS — R6521 Severe sepsis with septic shock: Secondary | ICD-10-CM | POA: Diagnosis not present

## 2024-04-28 DIAGNOSIS — R578 Other shock: Secondary | ICD-10-CM

## 2024-04-28 DIAGNOSIS — R7989 Other specified abnormal findings of blood chemistry: Secondary | ICD-10-CM

## 2024-04-28 DIAGNOSIS — N184 Chronic kidney disease, stage 4 (severe): Secondary | ICD-10-CM

## 2024-04-28 LAB — HEPATIC FUNCTION PANEL
ALT: 16 U/L (ref 0–44)
AST: 29 U/L (ref 15–41)
Albumin: 2.6 g/dL — ABNORMAL LOW (ref 3.5–5.0)
Alkaline Phosphatase: 165 U/L — ABNORMAL HIGH (ref 38–126)
Bilirubin, Direct: 0.1 mg/dL (ref 0.0–0.2)
Indirect Bilirubin: 0.4 mg/dL (ref 0.3–0.9)
Total Bilirubin: 0.5 mg/dL (ref 0.0–1.2)
Total Protein: 5.1 g/dL — ABNORMAL LOW (ref 6.5–8.1)

## 2024-04-28 LAB — CBC
HCT: 31.7 % — ABNORMAL LOW (ref 36.0–46.0)
HCT: 25.7 % — ABNORMAL LOW (ref 36.0–46.0)
Hemoglobin: 9.7 g/dL — ABNORMAL LOW (ref 12.0–15.0)
Hemoglobin: 8.5 g/dL — ABNORMAL LOW (ref 12.0–15.0)
MCH: 32.3 pg (ref 26.0–34.0)
MCH: 33.3 pg (ref 26.0–34.0)
MCHC: 30.6 g/dL (ref 30.0–36.0)
MCHC: 33.1 g/dL (ref 30.0–36.0)
MCV: 105.7 fL — ABNORMAL HIGH (ref 80.0–100.0)
MCV: 100.8 fL — ABNORMAL HIGH (ref 80.0–100.0)
Platelets: 266 10*3/uL (ref 150–400)
Platelets: 172 10*3/uL (ref 150–400)
RBC: 3 MIL/uL — ABNORMAL LOW (ref 3.87–5.11)
RBC: 2.55 MIL/uL — ABNORMAL LOW (ref 3.87–5.11)
RDW: 13.6 % (ref 11.5–15.5)
RDW: 13.5 % (ref 11.5–15.5)
WBC: 21.4 10*3/uL — ABNORMAL HIGH (ref 4.0–10.5)
WBC: 14.8 10*3/uL — ABNORMAL HIGH (ref 4.0–10.5)
nRBC: 0 % (ref 0.0–0.2)
nRBC: 0 % (ref 0.0–0.2)

## 2024-04-28 LAB — TROPONIN I (HIGH SENSITIVITY)
Troponin I (High Sensitivity): 50 ng/L — ABNORMAL HIGH (ref <18)
Troponin I (High Sensitivity): 356 ng/L (ref <18)
Troponin I (High Sensitivity): 777 ng/L (ref <18)
Troponin I (High Sensitivity): 1799 ng/L (ref <18)
Troponin I (High Sensitivity): 2792 ng/L (ref <18)
Troponin I (High Sensitivity): 4890 ng/L (ref <18)
Troponin I (High Sensitivity): 5036 ng/L (ref <18)
Troponin I (High Sensitivity): 6145 ng/L (ref <18)

## 2024-04-28 LAB — ECHOCARDIOGRAM COMPLETE
AR max vel: 2.32 cm2
AV Area VTI: 2.15 cm2
AV Area mean vel: 2.21 cm2
AV Mean grad: 5.5 mmHg
AV Peak grad: 9 mmHg
Ao pk vel: 1.5 m/s
Area-P 1/2: 4.4 cm2
Calc EF: 75.6 %
Height: 63.5 in
P 1/2 time: 317 ms
S' Lateral: 2 cm
Single Plane A2C EF: 78 %
Single Plane A4C EF: 73.7 %
Weight: 1654.33 [oz_av]

## 2024-04-28 LAB — GLUCOSE, CAPILLARY
Glucose-Capillary: 119 mg/dL — ABNORMAL HIGH (ref 70–99)
Glucose-Capillary: 296 mg/dL — ABNORMAL HIGH (ref 70–99)
Glucose-Capillary: 307 mg/dL — ABNORMAL HIGH (ref 70–99)
Glucose-Capillary: 337 mg/dL — ABNORMAL HIGH (ref 70–99)
Glucose-Capillary: 343 mg/dL — ABNORMAL HIGH (ref 70–99)
Glucose-Capillary: 73 mg/dL (ref 70–99)

## 2024-04-28 LAB — COOXEMETRY PANEL
Carboxyhemoglobin: 1.7 % — ABNORMAL HIGH (ref 0.5–1.5)
Methemoglobin: <0.7 % (ref 0.0–1.5)
O2 Saturation: 83.3 %
Total hemoglobin: 9.5 g/dL — ABNORMAL LOW (ref 12.0–16.0)

## 2024-04-28 LAB — URINALYSIS, ROUTINE W REFLEX MICROSCOPIC
Bilirubin Urine: NEGATIVE
Glucose, UA: 150 mg/dL — AB
Ketones, ur: NEGATIVE mg/dL
Nitrite: NEGATIVE
Protein, ur: NEGATIVE mg/dL
Specific Gravity, Urine: 1.009 (ref 1.005–1.030)
pH: 5 (ref 5.0–8.0)

## 2024-04-28 LAB — HEMOGLOBIN A1C
Hgb A1c MFr Bld: 7.4 % — ABNORMAL HIGH (ref 4.8–5.6)
Mean Plasma Glucose: 165.68 mg/dL

## 2024-04-28 LAB — BASIC METABOLIC PANEL WITH GFR
Anion gap: 10 (ref 5–15)
Anion gap: 10 (ref 5–15)
BUN: 63 mg/dL — ABNORMAL HIGH (ref 8–23)
BUN: 62 mg/dL — ABNORMAL HIGH (ref 8–23)
CO2: 18 mmol/L — ABNORMAL LOW (ref 22–32)
CO2: 21 mmol/L — ABNORMAL LOW (ref 22–32)
Calcium: 8.6 mg/dL — ABNORMAL LOW (ref 8.9–10.3)
Calcium: 8.3 mg/dL — ABNORMAL LOW (ref 8.9–10.3)
Chloride: 107 mmol/L (ref 98–111)
Chloride: 104 mmol/L (ref 98–111)
Creatinine, Ser: 2.48 mg/dL — ABNORMAL HIGH (ref 0.44–1.00)
Creatinine, Ser: 2.57 mg/dL — ABNORMAL HIGH (ref 0.44–1.00)
GFR, Estimated: 21 mL/min — ABNORMAL LOW (ref >60)
GFR, Estimated: 20 mL/min — ABNORMAL LOW (ref >60)
Glucose, Bld: 352 mg/dL — ABNORMAL HIGH (ref 70–99)
Glucose, Bld: 110 mg/dL — ABNORMAL HIGH (ref 70–99)
Potassium: 4.3 mmol/L (ref 3.5–5.1)
Potassium: 4.5 mmol/L (ref 3.5–5.1)
Sodium: 135 mmol/L (ref 135–145)
Sodium: 135 mmol/L (ref 135–145)

## 2024-04-28 LAB — LACTIC ACID, PLASMA
Lactic Acid, Venous: 4 mmol/L (ref 0.5–1.9)
Lactic Acid, Venous: 3.2 mmol/L (ref 0.5–1.9)
Lactic Acid, Venous: 4 mmol/L (ref 0.5–1.9)
Lactic Acid, Venous: 1.6 mmol/L (ref 0.5–1.9)

## 2024-04-28 LAB — MRSA NEXT GEN BY PCR, NASAL: MRSA by PCR Next Gen: NOT DETECTED

## 2024-04-28 LAB — HEPARIN LEVEL (UNFRACTIONATED): Heparin Unfractionated: 0.9 [IU]/mL — ABNORMAL HIGH (ref 0.30–0.70)

## 2024-04-28 LAB — STREP PNEUMONIAE URINARY ANTIGEN: Strep Pneumo Urinary Antigen: NEGATIVE

## 2024-04-28 LAB — PROCALCITONIN: Procalcitonin: 8.97 ng/mL

## 2024-04-28 LAB — LIPASE, BLOOD: Lipase: 36 U/L (ref 11–51)

## 2024-04-28 LAB — MAGNESIUM: Magnesium: 1.6 mg/dL — ABNORMAL LOW (ref 1.7–2.4)

## 2024-04-28 LAB — PHOSPHORUS: Phosphorus: 3.9 mg/dL (ref 2.5–4.6)

## 2024-04-28 MED ORDER — ORAL CARE MOUTH RINSE: 15.0000 mL | OROMUCOSAL | Status: DC | PRN

## 2024-04-28 MED ORDER — INSULIN GLARGINE-YFGN 100 UNIT/ML ~~LOC~~ SOLN
5.0000 [IU] | Freq: Every day | SUBCUTANEOUS | Status: DC
  Administered 2024-04-28 – 2024-05-04 (×7): 5 [IU] via SUBCUTANEOUS
  Filled 2024-04-28 (×7): qty 0.05

## 2024-04-28 MED ORDER — ASPIRIN 81 MG PO CHEW
81.0000 mg | CHEWABLE_TABLET | Freq: Every day | ORAL | Status: DC
  Administered 2024-04-29 – 2024-05-05 (×7): 81 mg via ORAL
  Filled 2024-04-28 (×8): qty 1

## 2024-04-28 MED ORDER — ASPIRIN 325 MG PO TABS
325.0000 mg | ORAL_TABLET | Freq: Once | ORAL | Status: AC
  Administered 2024-04-28: 325 mg via ORAL
  Filled 2024-04-28: qty 1

## 2024-04-28 MED ORDER — INSULIN ASPART 100 UNIT/ML IJ SOLN
1.0000 [IU] | Freq: Three times a day (TID) | INTRAMUSCULAR | Status: DC
  Administered 2024-04-28 – 2024-04-29 (×4): 1 [IU] via SUBCUTANEOUS

## 2024-04-28 MED ORDER — HEPARIN (PORCINE) 25000 UT/250ML-% IV SOLN
550.0000 [IU]/h | INTRAVENOUS | Status: DC
  Administered 2024-04-28 – 2024-04-30 (×3): 550 [IU]/h via INTRAVENOUS
  Filled 2024-04-28 (×2): qty 250

## 2024-04-28 MED ORDER — SODIUM BICARBONATE 8.4 % IV SOLN
INTRAVENOUS | Status: AC
  Filled 2024-04-28: qty 150
  Filled 2024-04-28: qty 1000

## 2024-04-28 MED ORDER — HEPARIN BOLUS VIA INFUSION
3000.0000 [IU] | Freq: Once | INTRAVENOUS | Status: AC
  Administered 2024-04-28: 3000 [IU] via INTRAVENOUS
  Filled 2024-04-28: qty 3000

## 2024-04-28 MED ORDER — MAGNESIUM SULFATE 2 GM/50ML IV SOLN
2.0000 g | Freq: Once | INTRAVENOUS | Status: AC
  Administered 2024-04-28: 2 g via INTRAVENOUS
  Filled 2024-04-28: qty 50

## 2024-04-28 MED ORDER — PIPERACILLIN-TAZOBACTAM 3.375 G IVPB
3.3750 g | Freq: Two times a day (BID) | INTRAVENOUS | Status: DC
  Administered 2024-04-28 – 2024-04-29 (×4): 3.375 g via INTRAVENOUS
  Filled 2024-04-28 (×5): qty 50

## 2024-04-28 MED ORDER — LINEZOLID 600 MG/300ML IV SOLN
600.0000 mg | Freq: Two times a day (BID) | INTRAVENOUS | Status: DC
  Administered 2024-04-28 – 2024-04-30 (×5): 600 mg via INTRAVENOUS
  Filled 2024-04-28 (×5): qty 300

## 2024-04-28 MED ORDER — SENNA 8.6 MG PO TABS
1.0000 | ORAL_TABLET | Freq: Every day | ORAL | Status: DC
  Filled 2024-04-28 (×3): qty 1

## 2024-04-28 MED ORDER — POLYETHYLENE GLYCOL 3350 17 G PO PACK
17.0000 g | PACK | Freq: Every day | ORAL | Status: DC
  Filled 2024-04-28 (×3): qty 1

## 2024-04-28 MED ORDER — CHLORHEXIDINE GLUCONATE CLOTH 2 % EX PADS
6.0000 | MEDICATED_PAD | Freq: Every day | CUTANEOUS | Status: DC
  Administered 2024-04-29 – 2024-05-04 (×6): 6 via TOPICAL

## 2024-04-28 NOTE — Progress Notes (Signed)
 Trop 1700 Start heparin , repeat EKG, check coox Echo still pending Consulting cardiology given her increased risk for CAD/ ACS with DM and very impressive coronary calcification on chest Ct.  Joesph Mussel, DO 04/28/24 9:18 AM [facility]  For contact information, see Amion. If no response to pager, please call PCCM consult pager. After hours, 7PM- 7AM, please call Elink.

## 2024-04-28 NOTE — Progress Notes (Signed)
 Troponin still climbing, coox preserved, pressors down today, no RWMA on echo with preserved EF.  Con't heparin .  Repeat trop at 6pm  Joesph Mussel, DO 04/28/24 5:00 PM [facility]

## 2024-04-28 NOTE — Progress Notes (Signed)
 Per RN now having some rectal bleeding. Ordered CBC with trop, heparin  level now.   D/w RN, keeping heparin  on now, but may need to be turned off depending on CBC results and volume of bleeding overnight. Enteritis on CT scan is the likely cause of bleeding. Not likely cardiogenic/low flow cause of inflammation with normal EF & coox.  Joesph Mussel, DO 04/28/24 6:39 PM [facility]  For contact information, see Amion. If no response to pager, please call PCCM consult pager. After hours, 7PM- 7AM, please call Elink.

## 2024-04-28 NOTE — Plan of Care (Signed)
  Problem: Education: Goal: Ability to describe self-care measures that [name] prevent or decrease complications (Diabetes Survival Skills Education) will improve 04/28/2024 1949 by Amylee Lodato J, RN Outcome: Progressing 04/28/2024 1949 by  Manrique J, RN Outcome: Progressing Goal: Individualized Educational Video(s) 04/28/2024 1949 by Huda Petrey J, RN Outcome: Progressing 04/28/2024 1949 by Lovena Kluck J, RN Outcome: Progressing

## 2024-04-28 NOTE — Progress Notes (Signed)
 PHARMACY - ANTICOAGULATION CONSULT NOTE  Pharmacy Consult for Heparin  Indication: chest pain/ACS  No Known Allergies  Patient Measurements: Height: 5' 3.5" (161.3 cm) Weight: 46.9 kg (103 lb 6.3 oz) IBW/kg (Calculated) : 53.55 HEPARIN  DW (KG): 47.3  Vital Signs: Temp: 97.5 F (36.4 C) (05/03 1200) Temp Source: Oral (05/03 1200) BP: 111/78 (05/03 1830) Pulse Rate: 82 (05/03 1830)  Labs: Recent Labs    04/27/24 1850 04/27/24 1855 04/28/24 0505 04/28/24 0812 04/28/24 0944 04/28/24 1313 04/28/24 1553 04/28/24 1759  HGB 11.4*  --  9.7*  --   --  8.5*  --   --   HCT 35.9*  --  31.7*  --   --  25.7*  --   --   PLT 276  --  266  --   --  172  --   --   HEPARINUNFRC  --   --   --   --   --   --   --  0.90*  CREATININE 2.50*  --  2.48*  --   --   --   --  2.57*  TROPONINIHS  --    < > 777* 1,799* 2,792*  --  4,890*  --    < > = values in this interval not displayed.    Estimated Creatinine Clearance: 16.4 mL/min (A) (by C-G formula based on SCr of 2.57 mg/dL (H)).   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   DDD (degenerative disc disease), lumbar    Diabetes mellitus without complication (HCC)    type 1   Diabetic ketoacidosis (HCC) 09/26/2020   Gait disorder    neurogenic   Gangrene of right foot (HCC)    Hypertension    Hypothyroid 12/08/2020   Neuropathy    Renal disease    Sacral decubitus ulcer, stage IV (HCC) 12/08/2020   Subacute osteomyelitis of right foot (HCC)     Medications:  Infusions:   azithromycin  Stopped (04/28/24 0010)   heparin  550 Units/hr (04/28/24 1724)   linezolid (ZYVOX) IV Stopped (04/28/24 1112)   norepinephrine  (LEVOPHED ) Adult infusion Stopped (04/28/24 1020)   piperacillin -tazobactam (ZOSYN )  IV Stopped (04/28/24 1603)    Assessment: 20 yoF admitted on 5/2 with sepsis.  Pharmacy  is now consulted to dose Heparin  for ACS.   No prior to admission anticoagulation.  Heparin  SQ last dose on 5/3 at 5am.   CBC: Hgb 9.7, Plt 266 Troponin: 7, 50, 356, 777, 1799, 4890  Heparin  level SUPRAtherapeutic on current IV heparin  rate of 550 units/hr CBC: Hgb 8.5, dropping. Plts 172, dropping Per RN, patient having some oozing from IV sites as well some rectal bleeding. She contacted CCM Md already and plan is to continue Heparin  for now due to continued rise in troponins but will closely monitor CBC results and volume of bleeding overnight  Goal of Therapy:  Heparin  level 0.3-0.7 units/ml Monitor platelets by anticoagulation protocol: Yes   Plan:  Reduce IV heparin  from 550 to 400 units/hr Recheck heparin  level in 8 hours Daily heparin  level and CBC   Luise Saint, PharmD, BCPS Secure Chat if ?s 04/28/2024 6:55 PM

## 2024-04-28 NOTE — Progress Notes (Signed)
 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 The patient was to a routine doctor's appointment with her husband and after getting out of the appointment and getting in the car planning to go eat, patient suddenly became intensely nauseated with severe epigastric pain. She denies radiation to the back. She vomited 4 times of brown fluid. No diarrhea, f/c/r, CP, SOB, DOE, cough, wheezing, rash, flank pain, or dysuria. Her husband brought her to ED and BP was 67/46 in triage. She feels lightheaded. Her husband reports that she seemed to get kind of lethargic briefly. Patient denies any similar episodes. No smoking, alcohol  drinking, or illicit drug use.   Pertinent  Medical History  Three times renal transplant (last one in 2011), DM-1 (on insulin  pump), hypothyroidism, HTN, GERD, esophagitis, hiatal hernia, anemia of chronic illness  Significant Hospital Events: Including procedures, antibiotic start and stop dates in addition to other pertinent events   ED: Rx for hyperkalemia with Albuterol , Ca, and IV insulin . Received 3 L LR bolus. Started on Zosyn . Started Levophed  @ 10 mcg/min due to persistent hypotension   Interim History / Subjective:  She reports her foley was changed in ER, but no documentation of this  Objective   Blood pressure 132/65, pulse 93, temperature 98 F (36.7 C), temperature source Oral, resp. rate 11, height 5' 3.5" (1.613 m), weight 46.9 kg, SpO2 97%.       No intake or output data in the 24 hours ending 04/28/24 0727 Filed Weights   04/28/24 0000 04/28/24 0016 04/28/24 0424  Weight: 47.3 kg 46.9 kg 46.9 kg     Examination: General: chronically ill appearing woman lying in bed in NAD HENT: Show Low/AT, eyes anicteric Lungs: breathing comfortably on RA, CTAB Cardiovascular: S1S2, RRR Abdomen: multiple well healed scars, not distended Extremities: BKA RLE, minimal muscle mass  Neuro: not giving accurate history, falling asleep mid-task. Normal speech, moving extremities  Trop 50>357>777 LA 4>3.2>4 WBC 21.4 H/H 9.7/31.7 Platelets 266 BUN 63 Cr 2.48 Mg+  1.6 CT chest: linear GGO in lung bases- early pneumonia vs more likely atelectasis.  Air fluid level in patulous esophagus. Advanced coronary calcification. Wall thickening and edema in some bowel loops in left abdomen. Advanced calcification of aorta, iliacs. R external iliac with aneurysm. Chronic compression fractures.    Resolved Hospital Problem list     Assessment & Plan:  Septic shock, possibly due to cholecystitis, cholangitis. She has history of pancreas transplant many years ago, so unsure what anatomic changes may be expected with this.  With chronic foley worry about UTI, Ct concerning for possible lower lobe infiltrates. Immunocompromised.  Possible enteritis on CT. Lactic acidosis due to sepsis -RUQ this morning; if confirms cholecystitis will need GI evaluation for ERCP. Has MRCP ordered.  -change foley, send UA & culture -follow blood cultures -con't broad antibiotics- azithromycin , zosyn , vanc given hisotry of immunocompromise.  -vaospressors to maintain MAP >65; downtrending -con't bicarb gtt for now -serial LA -echocardiogram  Acute metabolic encephalopathy due to sepsis -antibiotics -avoid deliriogenic meds  Troponin elevation; possible inferior ischemia. Has significant calcification of coronary arteries.  -echo -tele monitoring -serial trop  Constipation -senna and miralax  daily  At risk for adrenal insufficiency: on Prednisone  5 mg daily for 20 yrs -stress dose steroids  Hyperkalemia, resolved -monitor  on bicarb gtt  AKI, likely septic ATN Renal transplant (last one in 2011) with CKD 4, chronic foley -strict I/O -renally dose meds, avoid nephrotoxic meds -monitor -change foley -con't holding immunosuppressing meds-cyclosporine ,  azathioprine , prednisone   DM-1 , A1c 7.4 -discussed with her pausing her omnipod and switching to basal-bolus with shots. Her TDD basal insulin  is 6 units (0.25 units per hr). Start glargine 5 units daily. Unable to see her CF or carb equivalent on her app. -sensitive SSI -1 unit with meals once she starts eating -goal BG 140-180 -hold PTA insulin  pump  Hypothyroidism -con't PTA synthroid   HTN -hold PTA antihypertensives  GERD, esophagitis, hiatal hernia; likely very frequent reflux -PPI, aspiration precautions  Anemia of chronic illness -transfuse for Hb <7 or hemodynamically significant bleeding -monitor  Possible pancreatic mass, but similar to previous CT -defer to OP workup as needed -can follow up with her transplant [facility] [place])  Best Practice (right click and "Reselect all SmartList Selections" daily)   Diet/type: NPO w/ oral meds DVT prophylaxis prophylactic heparin   Pressure ulcer(s): N/A GI prophylaxis: PPI Lines: Central line Foley:  Yes, and it is still needed-- chronic foley Code Status:  full code Last date of multidisciplinary goals of care discussion []   Labs   CBC: Recent Labs  Lab 04/27/24 1848 04/27/24 1850 04/28/24 0505  WBC  --  6.5 21.4*  HGB 12.6 11.4* 9.7*  HCT 37.0 35.9* 31.7*  MCV  --  102.9* 105.7*  PLT  --  276 266    Basic Metabolic Panel: Recent Labs  Lab 04/27/24 1848 04/27/24 1850 04/27/24 1855 04/28/24 0505  NA 133* 134*  --  135  K 7.1* 5.2*  --  4.3  CL 116* 109  --  107  CO2  --  12*  --  18*  GLUCOSE 199* 200*  --  352*  BUN 91* 71*  --  63*  CREATININE 2.70* 2.50*  --  2.48*  CALCIUM   --  9.8  --  8.6*  MG  --   --  2.3 1.6*  PHOS  --   --  4.2 3.9    GFR: Estimated Creatinine Clearance: 17 mL/min (A) (by C-G formula based on SCr of 2.48 mg/dL (H)). Recent Labs  Lab 04/27/24 1850 04/28/24 0011 04/28/24 0250 04/28/24 0505  PROCALCITON  --   --   --  8.97  WBC 6.5  --   --  21.4*  LATICACIDVEN  --  4.0* 3.2* 4.0*    Liver Function Tests: Recent Labs  Lab 04/27/24 1850 04/28/24 0505  AST 25 29  ALT 15 16  ALKPHOS 187* 165*  BILITOT 0.7 0.5  PROT 6.7 5.1*  ALBUMIN  3.2* 2.6*   Recent Labs  Lab 04/27/24 1850 04/28/24 0505  LIPASE 43 36    ABG    Component Value Date/Time   HCO3 21.3 11/08/2020 1914   TCO2 13 (L) 04/27/2024 1848   ACIDBASEDEF 2.0 11/08/2020 1914   O2SAT 62.8 11/08/2020 1914        Critical care time:     This patient is critically ill with multiple organ system failure which requires frequent high complexity decision making, assessment, support, evaluation, and titration of therapies. This was completed through the application of advanced monitoring technologies and extensive interpretation of multiple databases. During this encounter critical care time was devoted to  patient care services described in this note for 55 minutes.  Joesph Mussel, DO 04/28/24 8:25 AM [facility]  For contact information, see Amion. If no response to pager, please call PCCM consult pager. After hours, 7PM- 7AM, please call Elink.

## 2024-04-28 NOTE — Plan of Care (Signed)
 MRI today pending result. Medium new blood BM. Troponin's 5000 Trending up

## 2024-04-28 NOTE — Progress Notes (Signed)
 PHARMACY - ANTICOAGULATION CONSULT NOTE  Pharmacy Consult for Heparin  Indication: chest pain/ACS  No Known Allergies  Patient Measurements: Height: 5' 3.5" (161.3 cm) Weight: 46.9 kg (103 lb 6.3 oz) IBW/kg (Calculated) : 53.55 HEPARIN  DW (KG): 47.3  Vital Signs: Temp: 98 F (36.7 C) (05/03 0400) Temp Source: Oral (05/03 0400) BP: 135/65 (05/03 0845) Pulse Rate: 85 (05/03 0845)  Labs: Recent Labs    04/27/24 1848 04/27/24 1850 04/27/24 1855 04/28/24 0250 04/28/24 0505 04/28/24 0812  HGB 12.6 11.4*  --   --  9.7*  --   HCT 37.0 35.9*  --   --  31.7*  --   PLT  --  276  --   --  266  --   CREATININE 2.70* 2.50*  --   --  2.48*  --   TROPONINIHS  --   --    < > 356* 777* 1,799*   < > = values in this interval not displayed.    Estimated Creatinine Clearance: 17 mL/min (A) (by C-G formula based on SCr of 2.48 mg/dL (H)).   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   DDD (degenerative disc disease), lumbar    Diabetes mellitus without complication (HCC)    type 1   Diabetic ketoacidosis (HCC) 09/26/2020   Gait disorder    neurogenic   Gangrene of right foot (HCC)    Hypertension    Hypothyroid 12/08/2020   Neuropathy    Renal disease    Sacral decubitus ulcer, stage IV (HCC) 12/08/2020   Subacute osteomyelitis of right foot (HCC)     Medications:  Infusions:   azithromycin  500 mg (04/27/24 2221)   linezolid (ZYVOX) IV     magnesium  sulfate bolus IVPB     norepinephrine  (LEVOPHED ) Adult infusion 8 mcg/min (04/28/24 0349)   piperacillin -tazobactam (ZOSYN )  IV     sodium bicarbonate  150 mEq in dextrose  5 % 1,150 mL infusion 150 mL/hr at 04/28/24 0700    Assessment: 62 yoF admitted on 5/2 with sepsis.  Pharmacy is now consulted to dose Heparin  for ACS.   No prior to admission anticoagulation.  Heparin  SQ last  dose on 5/3 at 5am.   CBC: Hgb 9.7, Plt 266 Troponin: 7, 50, 356, 777, 1799   Goal of Therapy:  Heparin  level 0.3-0.7 units/ml Monitor platelets by anticoagulation protocol: Yes   Plan:  Give heparin  3000 units bolus IV x 1 Start heparin  IV infusion at 550 units/hr Heparin  level 8 hours after starting Daily heparin  level and CBC   Kendall Pauls PharmD, BCPS [facility] main pharmacy [phone] 04/28/2024 9:25 AM

## 2024-04-28 NOTE — Progress Notes (Signed)
  Echocardiogram 2D Echocardiogram has been performed.  [name] 04/28/2024, 9:17 AM

## 2024-04-28 NOTE — Consult Note (Signed)
 [facility] Consult  Referring Provider: ER Primary Care Physician:  Authur Leghorn, MD Primary Gastroenterologist: Para Bold  Reason for Consultation: Possible choledocholithiasis  HPI: [name] is a 63 y.o. female with a complicated past medical history requiring renal transplant x 3 at [facility] [place]  and a history of pancreas transplant in 1989(? as per patient, experimental, history of diabetes since age 74) presented to the ER with sudden onset of severe epigastric pain associated with 4-6 episodes of bilious vomiting.  In the ER patient was found to be hypotensive, briefly lethargic and was admitted for further evaluation.  Patient states her abdominal pain has improved since last night. She is no longer nauseous. She does not have acid reflux or heartburn, denies difficulty swallowing or pain on swallowing. She reports a bowel movement every 3 days, suffers from constipation, denies noticing blood in stool or black stools. Denies having a colonoscopy in the past. Patient had an inpatient EGD in 2021 for coffee-ground emesis, Dr. Elvin Hammer which showed erosive esophagitis and patient was advised to take pantoprazole  twice a day.   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   DDD (degenerative disc disease), lumbar    Diabetes mellitus without complication (HCC)    type 1   Diabetic ketoacidosis (HCC) 09/26/2020   Gait disorder    neurogenic   Gangrene of right foot (HCC)    Hypertension    Hypothyroid 12/08/2020   Neuropathy    Renal disease    Sacral decubitus ulcer, stage IV (HCC) 12/08/2020   Subacute osteomyelitis of right foot [facility])     Past Surgical History:  Procedure Laterality Date   AMPUTATION Right 10/01/2020   Procedure: RIGHT BELOW KNEE AMPUTATION;  Surgeon: Timothy Ford, MD;  Location: [facility] OR;  Service:  Orthopedics;  Laterality: Right;   BLADDER SURGERY     ESOPHAGOGASTRODUODENOSCOPY (EGD) WITH PROPOFOL  N/A 11/10/2020   Procedure: ESOPHAGOGASTRODUODENOSCOPY (EGD) WITH PROPOFOL ;  Surgeon: Tobin Forts, MD;  Location: [facility]COPY;  Service: Endoscopy;  Laterality: N/A;   IR GASTROSTOMY TUBE REMOVAL  03/25/2021   KIDNEY TRANSPLANT     LEG SURGERY     tib-fib fx , ORIF    Prior to Admission medications   Medication Sig Start Date End Date Taking? Authorizing Provider  atorvastatin  (LIPITOR) 10 MG tablet Take 10 mg by mouth daily.   Yes [provider]  cycloSPORINE  modified (NEORAL ) 25 MG capsule Take 75 mg by mouth in the morning and at bedtime. 12/13/21  Yes [provider]  Insulin  Disposable Pump (OMNIPOD 5 [id number] PODS GEN 5) MISC Apply 1 Application topically every 3 (three) days.   Yes [provider]  lisinopril  (ZESTRIL ) 5 MG tablet Take 2.5 mg by mouth daily.   Yes [provider]  NOVOLOG  100 UNIT/ML injection Inject 100 Units into the skin See admin instructions. Per Omnipod insulin  pump   Yes [provider]  predniSONE  (DELTASONE ) 5 MG tablet Take 1 tablet (5 mg total) by mouth daily with breakfast. 03/02/21  Yes Barbee Lew, MD  azaTHIOprine  (IMURAN ) 50 MG tablet Take 1 tablet (50 mg total) by mouth daily. Patient not taking: Reported on 04/27/2024 03/02/21   Barbee Lew, MD  buPROPion (WELLBUTRIN XL) 150 MG 24 hr tablet Take 1 tablet by mouth every morning. Patient not taking: Reported on 04/27/2024 04/24/24  [provider]  cephALEXin  (KEFLEX ) 500 MG capsule Take 500 mg by mouth 2 (two) times daily. Patient not taking: Reported on 04/27/2024 04/20/24   [provider]  ferrous sulfate 325 (65 FE) MG tablet Take 325 mg by mouth daily. Patient not taking: Reported on 04/27/2024 04/11/24   [provider]  levothyroxine  (SYNTHROID ) 100 MCG tablet Take 100 mcg by mouth daily before breakfast.     [provider]  sucralfate  (CARAFATE ) 1 GM/10ML suspension Take 10 mLs (1 g total) by mouth 4 (four) times daily -  with meals and at bedtime. Patient not taking: Reported on 04/27/2024 12/22/21 01/09/24  Armenta Landau, MD    Current Facility-Administered Medications  Medication Dose Route Frequency Provider Last Rate Last Admin   acetaminophen  (TYLENOL ) tablet 650 mg  650 mg Oral Q6H PRN Albustami, Omar M, MD       azithromycin  (ZITHROMAX ) 500 mg in sodium chloride  0.9 % 250 mL IVPB  500 mg Intravenous Q24H Albustami, Omar M, MD 250 mL/hr at 04/27/24 2221 500 mg at 04/27/24 2221   Chlorhexidine  Gluconate Cloth 2 % PADS 6 each  6 each Topical Daily Albustami, Omar M, MD       docusate sodium  (COLACE) capsule 100 mg  100 mg Oral BID PRN Albustami, Omar M, MD       heparin  injection 5,000 Units  5,000 Units Subcutaneous Q8H Albustami, Omar M, MD   5,000 Units at 04/28/24 0511   hydrocortisone  sodium succinate  (SOLU-CORTEF ) 100 MG injection 50 mg  50 mg Intravenous Q6H Albustami, Omar M, MD   50 mg at 04/28/24 6578   HYDROmorphone  (DILAUDID ) injection 0.5 mg  0.5 mg Intravenous Q3H PRN Albustami, Omar M, MD       HYDROmorphone  (DILAUDID ) injection 1 mg  1 mg Intravenous Q3H PRN Albustami, Omar M, MD   1 mg at 04/28/24 0127   insulin  aspart (novoLOG ) injection 0-9 Units  0-9 Units Subcutaneous Q4H Arlyne Bering, MD   7 Units at 04/28/24 4696   insulin  aspart (novoLOG ) injection 1 Units  1 Units Subcutaneous TID WC Jaquita Merl P, DO       insulin  glargine-yfgn (SEMGLEE ) injection 5 Units  5 Units Subcutaneous Daily Jaquita Merl P, DO       ipratropium-albuterol  (DUONEB) 0.5-2.5 (3) MG/3ML nebulizer solution 3 mL  3 mL Nebulization Q6H PRN Albustami, Linna Richard, MD       levothyroxine  (SYNTHROID ) tablet 100 mcg  100 mcg Oral Q0600 Albustami, Omar M, MD       linezolid (ZYVOX) IVPB 600 mg  600 mg Intravenous Q12H Jaquita Merl P, DO       magnesium  sulfate IVPB 2 g 50 mL  2 g Intravenous  Once Jaquita Merl P, DO       norepinephrine  (LEVOPHED ) 4mg  in (0.016 mg/mL) premix infusion  0-40 mcg/min Intravenous Continuous Wynetta Heckle, MD 30 mL/hr at 04/28/24 0349 8 mcg/min at 04/28/24 0349   ondansetron  (ZOFRAN ) injection 4 mg  4 mg Intravenous Q6H PRN Arlyne Bering, MD   4 mg at 04/28/24 2952   Oral care mouth rinse  15 mL Mouth Rinse PRN Albustami, Linna Richard, MD       pantoprazole  (PROTONIX ) injection 40 mg  40 mg Intravenous Q24H Albustami, Omar M, MD   40 mg at 04/28/24 0825   piperacillin -tazobactam (ZOSYN ) IVPB 3.375 g  3.375 g Intravenous Q12H Jaquita Merl P, DO       polyethylene glycol (MIRALAX  /  GLYCOLAX ) packet 17 g  17 g Oral Daily PRN Albustami, Linna Richard, MD       polyethylene glycol (MIRALAX  / GLYCOLAX ) packet 17 g  17 g Oral Daily Jaquita Merl P, DO       senna (SENOKOT) tablet 8.6 mg  1 tablet Oral Daily Jaquita Merl P, DO       sodium bicarbonate  150 mEq in dextrose  5 % 1,150 mL infusion   Intravenous Continuous Paliwal, Aditya, MD 150 mL/hr at 04/28/24 0700 New Bag at 04/28/24 0700    Allergies as of 04/27/2024   (No Known Allergies)    Family History  Problem Relation Age of Onset   Hypertension Mother    Hypertension Father     Social History   Socioeconomic History   Marital status: Married    Spouse name: Not on file   Number of children: Not on file   Years of education: Not on file   Highest education level: Not on file  Occupational History   Not on file  Tobacco Use   Smoking status: Never   Smokeless tobacco: Never  Vaping Use   Vaping status: Never Used  Substance and Sexual Activity   Alcohol  use: Never   Drug use: Never   Sexual activity: Not Currently    Birth control/protection: None  Other Topics Concern   Not on file  Social History Narrative   Not on file   Social Drivers of Health   Financial Resource Strain: Low Risk  (04/27/2024)   Received from [facility]   Overall Financial Resource Strain (CARDIA)     Difficulty of Paying Living Expenses: Not very hard  Food Insecurity: No Food Insecurity (04/28/2024)   Hunger Vital Sign    Worried About Running Out of Food in the Last Year: Never true    Ran Out of Food in the Last Year: Never true  Transportation Needs: No Transportation Needs (04/28/2024)   PRAPARE - Administrator, Civil Service (Medical): No    Lack of Transportation (Non-Medical): No  Physical Activity: Unknown (04/27/2024)   Received from [facility]   Exercise Vital Sign    Days of Exercise per Week: 0 days    Minutes of Exercise per Session: Not on file  Stress: No Stress Concern Present (04/27/2024)   Received from [facility] of Occupational Health - Occupational Stress Questionnaire    Feeling of Stress : Not at all  Social Connections: Moderately Integrated (04/27/2024)   Received from [facility]   Social Network    How would you rate your social network (family, work, friends)?: Adequate participation with social networks  Intimate Partner Violence: Not At Risk (04/28/2024)   Humiliation, Afraid, Rape, and Kick questionnaire    Fear of Current or Ex-Partner: No    Emotionally Abused: No    Physically Abused: No    Sexually Abused: No    Review of Systems: As per HPI.  Physical Exam: Vital signs in last 24 hours: Temp:  [97.5 F (36.4 C)-98 F (36.7 C)] 98 F (36.7 C) (05/03 0400) Pulse Rate:  [85-108] 85 (05/03 0845) Resp:  [8-22] 12 (05/03 0845) BP: (67-157)/(33-83) 135/65 (05/03 0845) SpO2:  [92 %-100 %] 99 % (05/03 0845) Weight:  [46.9 kg-59 kg] 46.9 kg (05/03 0424) Last BM Date : 04/28/24  General:   Ill-appearing  Head:  Normocephalic and atraumatic. Eyes:  Sclera clear, no icterus.   Conjunctiva pink. Ears:  Normal auditory  acuity. Nose:  No deformity, discharge,  or lesions. Mouth:  No deformity or lesions.  Oropharynx pink & moist. Neck:  Supple; no masses or thyromegaly. Lungs:  Clear throughout to auscultation.    No wheezes, crackles, or rhonchi. No acute distress. Heart:  Regular rate and rhythm; no murmurs, clicks, rubs,  or gallops. Extremities: Right below-knee amputation. Neurologic:  Alert and  oriented x4;  grossly normal neurologically. Skin:  Intact without significant lesions or rashes. Psych:  Alert and cooperative. Normal mood and affect. Abdomen: Well-healed surgical scars noted, chronic indwelling Foley catheter, nontender, bowel sounds audible         Lab Results: Recent Labs    04/27/24 1848 04/27/24 1850 04/28/24 0505  WBC  --  6.5 21.4*  HGB 12.6 11.4* 9.7*  HCT 37.0 35.9* 31.7*  PLT  --  276 266   BMET Recent Labs    04/27/24 1848 04/27/24 1850 04/28/24 0505  NA 133* 134* 135  K 7.1* 5.2* 4.3  CL 116* 109 107  CO2  --  12* 18*  GLUCOSE 199* 200* 352*  BUN 91* 71* 63*  CREATININE 2.70* 2.50* 2.48*  CALCIUM   --  9.8 8.6*   LFT Recent Labs    04/28/24 0505  PROT 5.1*  ALBUMIN  2.6*  AST 29  ALT 16  ALKPHOS 165*  BILITOT 0.5  BILIDIR 0.1  IBILI 0.4   PT/INR No results for input(s): "LABPROT", "INR" in the last 72 hours.  Studies/Results: US  Abdomen Limited RUQ (LIVER/GB) Result Date: 04/28/2024 CLINICAL DATA:  Hyperbilirubinemia EXAM: ULTRASOUND ABDOMEN LIMITED RIGHT UPPER QUADRANT COMPARISON:  CT from the previous day FINDINGS: Gallbladder: Physiologically distended. Wall thickness is normal, 2.7 mm. Cluster of calculi measuring up to 8 mm in the dependent aspect. Sonographer reports no sonographic Gamboa sign. Common bile duct: Diameter: 9 mm. 6 mm echogenic focus in the mid common bile duct, without caliber change of the duct. Liver: No focal lesion identified. Within normal limits in parenchymal echogenicity. Portal vein is patent on color Doppler imaging with normal direction of blood flow towards the liver. Other: Technologist describes technically difficult study secondary to lack of patient cooperation. IMPRESSION: 1. Cholelithiasis without  sonographic evidence of acute cholecystitis. 2. 6 mm echogenic focus in the mid common bile duct, without caliber change of the duct, possibly nonobstructive choledocholithiasis. Electronically Signed   By: Nicoletta Barrier M.D.   On: 04/28/2024 08:26   DG Chest Portable 1 View Result Date: 04/27/2024 CLINICAL DATA:  Central line placement EXAM: PORTABLE CHEST 1 VIEW COMPARISON:  Chest x-ray 04/27/2024 FINDINGS: Left-sided central venous catheter tip projects over the distal SVC. Multiple surgical clips overlie the left axilla, unchanged. Heart is mildly enlarged. The lungs are clear. There is no pleural effusion or pneumothorax. IMPRESSION: Left-sided central venous catheter tip projects over the distal SVC. No pneumothorax. Electronically Signed   By: Tyron Gallon M.D.   On: 04/27/2024 22:02   CT CHEST ABDOMEN PELVIS WO CONTRAST Result Date: 04/27/2024 CLINICAL DATA:  Sepsis EXAM: CT CHEST, ABDOMEN AND PELVIS WITHOUT CONTRAST TECHNIQUE: Multidetector CT imaging of the chest, abdomen and pelvis was performed following the standard protocol without IV contrast. RADIATION DOSE REDUCTION: This exam was performed according to the departmental dose-optimization program which includes automated exposure control, adjustment of the mA and/or kV according to patient size and/or use of iterative reconstruction technique. COMPARISON:  CT abdomen and pelvis 12/17/2021. FINDINGS: CT CHEST FINDINGS Cardiovascular: No significant vascular findings. The heart is mildly enlarged. No  pericardial effusion. There are atherosclerotic calcifications of the aorta and coronary arteries. Mediastinum/Nodes: There is a small air-fluid level throughout the esophagus. A small hiatal hernia is present. There is questionable distal esophageal wall thickening. No enlarged lymph nodes are identified. Visualized thyroid  gland is within normal limits. Lungs/Pleura: Patchy ground-glass and linear airspace opacities are seen in the bilateral lower  lobes. There also some ground-glass opacities in the lingula. The lungs are otherwise clear. There is no pleural effusion or pneumothorax. Musculoskeletal: There is trace compression deformity the superior endplate T4 T8 and T11. No acute fracture lines are present. CT ABDOMEN PELVIS FINDINGS Hepatobiliary: Gallstones are present. There is minimal pericholecystic fluid. There is mild intrahepatic biliary ductal dilatation. Common bile duct appears dilated. Questionable choledocholithiasis measuring 9 mm image 2/49. No focal liver lesions are seen. Pancreas: Atrophic, unchanged. Not well evaluated on this noncontrast study. Questionable rounded hypodense lesion in the head of the pancreas image 2/58 measuring 1 cm, mildly increased in size from prior. Spleen: Normal in size without focal abnormality. Adrenals/Urinary Tract: The adrenal glands are within normal limits. There is marked bilateral atrophy of the native kidneys, unchanged. There is a transplanted left lower quadrant kidney. There is minimal stranding surrounding the posterior aspect of the transplanted kidney, unchanged from prior. There is no hydronephrosis. There are cysts in the left kidney as seen on the prior study. There are few punctate calcifications in the left kidney which may be vascular, new from prior. The bladder is decompressed by Foley catheter. Stomach/Bowel: No dilated bowel loops are seen. There is a large amount of stool throughout the entire colon. The appendix is visualized and appears normal. There is some wall thickening and mesenteric edema involving small bowel loops in the left abdomen. The stomach is nondilated. Vascular/Lymphatic: There are severe atherosclerotic calcifications of the aorta and peripheral vessels. No evidence for aortic aneurysm. 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 There are multiple surgical clips along the pelvic sidewalls. No definitive enlarged  lymph nodes identified. Reproductive: Uterus not definitely seen. Other: There is presacral edema and trace free fluid in the pelvis. There is scarring in the anterior abdominal wall. Musculoskeletal: The bones are osteopenic. There is trace compression deformity of the superior endplate of L1 which is chronic and unchanged. Degenerative changes affect the spine. Right hip arthroplasty is present. IMPRESSION: 1. Patchy ground-glass and linear airspace opacities in the bilateral lower lobes and lingula worrisome for multifocal pneumonia. 2. Small hiatal hernia with air-fluid level in the esophagus. Questionable distal esophageal wall thickening. Correlate clinically for esophagitis. 3. Cholelithiasis with minimal pericholecystic fluid. Mild intrahepatic and extrahepatic biliary ductal dilatation with questionable choledocholithiasis. Correlate clinically for cholecystitis/biliary obstruction. Consider further evaluation with MRCP or ERCP. 4. Questionable rounded hypodense lesion in the head of the pancreas measuring 1 cm, mildly increased in size from prior. Recommend further evaluation with MRI/MRCP. 5. Wall thickening and mesenteric edema involving small bowel loops in the left abdomen worrisome for enteritis including infectious, inflammatory and ischemic etiologies. 6. Large amount of stool throughout the colon compatible with constipation. Aortic Atherosclerosis (ICD10-I70.0). Electronically Signed   By: Tyron Gallon M.D.   On: 04/27/2024 20:18   DG Chest Port 1 View Result Date: 04/27/2024 CLINICAL DATA:  Epigastric pain. EXAM: PORTABLE CHEST 1 VIEW COMPARISON:  Chest radiograph dated 09/26/2020. FINDINGS: No focal consolidation, pleural effusion, or pneumothorax. The cardiac silhouette is within limits. Atherosclerotic calcification of the aorta. Osteopenia with degenerative changes of  the spine. No acute osseous pathology. IMPRESSION: No active disease. Electronically Signed   By: Angus Bark M.D.    On: 04/27/2024 19:08    Impression: Sudden onset of abdominal pain with nausea and vomiting T. bili/AST/ALT/ALP of 0.5/36/29/165 CT showed : cholelithiasis with minimal pericholecystic fluid Mild intra and extrahepatic biliary ductal dilatation?  Possible choledocholithiasis  Ultrasound showed: Cluster of calculi measuring up to 8 mm in gallbladder CBD 9 mm, 6 mm focus in mid common bile duct without change in caliber of duct, possible nonobstructive choledocholithiasis  History of pancreas transplant History of renal transplant x 3, BUN 63, creatinine 2.48, GFR 21, mild acidosis bicarb 18, was 12 on presentation Malnutrition, total protein 5.1, albumin  2.1  Elevated troponin I: 1799/777/356 Lactic acidosis, 4 Leukocytosis WBC 21.4 Macrocytic anemia, hemoglobin 9.7, MCV 105.7  ?  Bilateral lower lobes and lingula opacities worrisome for multifocal pneumonia  Atrophic pancreas with 1 cm rounded hypodense lesion in head of pancreas  Constipation-on Senokot, MiraLAX    Plan: With history of pancreas transplant, recommend MRCP to delineate biliary structure and evaluate for choledocholithiasis in case ERCP is needed. On broad-spectrum antibiotics IV linezolid, IV Zosyn , IV azithromycin  On IV pressor Levophed  and bicarbonate drip and IV Solu-Cortef   Elevated troponin, history of calcification of coronary arteries, possible inferior ischemia, echo pending  Multiple comorbidities: Pancreas and renal transplant, diabetes, chronic kidney disease, hyperkalemia, hypothyroidism, hypertension, esophagitis   LOS: 1 day   Genell Ken, MD  04/28/2024, 9:09 AM

## 2024-04-29 DIAGNOSIS — G9341 Metabolic encephalopathy: Secondary | ICD-10-CM | POA: Diagnosis not present

## 2024-04-29 DIAGNOSIS — E872 Acidosis, unspecified: Secondary | ICD-10-CM | POA: Diagnosis not present

## 2024-04-29 DIAGNOSIS — R6521 Severe sepsis with septic shock: Secondary | ICD-10-CM | POA: Diagnosis not present

## 2024-04-29 DIAGNOSIS — A419 Sepsis, unspecified organism: Secondary | ICD-10-CM | POA: Diagnosis not present

## 2024-04-29 DIAGNOSIS — I214 Non-ST elevation (NSTEMI) myocardial infarction: Secondary | ICD-10-CM

## 2024-04-29 LAB — CBC
HCT: 23.4 % — ABNORMAL LOW (ref 36.0–46.0)
HCT: 22.5 % — ABNORMAL LOW (ref 36.0–46.0)
Hemoglobin: 7.5 g/dL — ABNORMAL LOW (ref 12.0–15.0)
Hemoglobin: 7.3 g/dL — ABNORMAL LOW (ref 12.0–15.0)
MCH: 32.2 pg (ref 26.0–34.0)
MCH: 32.9 pg (ref 26.0–34.0)
MCHC: 32.1 g/dL (ref 30.0–36.0)
MCHC: 32.4 g/dL (ref 30.0–36.0)
MCV: 100.4 fL — ABNORMAL HIGH (ref 80.0–100.0)
MCV: 101.4 fL — ABNORMAL HIGH (ref 80.0–100.0)
Platelets: 170 10*3/uL (ref 150–400)
Platelets: 155 10*3/uL (ref 150–400)
RBC: 2.33 MIL/uL — ABNORMAL LOW (ref 3.87–5.11)
RBC: 2.22 MIL/uL — ABNORMAL LOW (ref 3.87–5.11)
RDW: 13.6 % (ref 11.5–15.5)
RDW: 13.7 % (ref 11.5–15.5)
WBC: 13.5 10*3/uL — ABNORMAL HIGH (ref 4.0–10.5)
WBC: 11.2 10*3/uL — ABNORMAL HIGH (ref 4.0–10.5)
nRBC: 0 % (ref 0.0–0.2)
nRBC: 0 % (ref 0.0–0.2)

## 2024-04-29 LAB — COOXEMETRY PANEL
Carboxyhemoglobin: 1.7 % — ABNORMAL HIGH (ref 0.5–1.5)
Methemoglobin: <0.7 % (ref 0.0–1.5)
O2 Saturation: 72 %
Total hemoglobin: 7.8 g/dL — ABNORMAL LOW (ref 12.0–16.0)

## 2024-04-29 LAB — URINE CULTURE: Culture: NO GROWTH

## 2024-04-29 LAB — GLUCOSE, CAPILLARY
Glucose-Capillary: 162 mg/dL — ABNORMAL HIGH (ref 70–99)
Glucose-Capillary: 170 mg/dL — ABNORMAL HIGH (ref 70–99)
Glucose-Capillary: 182 mg/dL — ABNORMAL HIGH (ref 70–99)
Glucose-Capillary: 191 mg/dL — ABNORMAL HIGH (ref 70–99)
Glucose-Capillary: 193 mg/dL — ABNORMAL HIGH (ref 70–99)
Glucose-Capillary: 234 mg/dL — ABNORMAL HIGH (ref 70–99)
Glucose-Capillary: 366 mg/dL — ABNORMAL HIGH (ref 70–99)

## 2024-04-29 LAB — TROPONIN I (HIGH SENSITIVITY): Troponin I (High Sensitivity): 3816 ng/L (ref <18)

## 2024-04-29 LAB — HEPARIN LEVEL (UNFRACTIONATED)
Heparin Unfractionated: 0.56 [IU]/mL (ref 0.30–0.70)
Heparin Unfractionated: 0.4 [IU]/mL (ref 0.30–0.70)
Heparin Unfractionated: 0.29 [IU]/mL — ABNORMAL LOW (ref 0.30–0.70)

## 2024-04-29 LAB — COMPREHENSIVE METABOLIC PANEL WITH GFR
ALT: 13 U/L (ref 0–44)
AST: 24 U/L (ref 15–41)
Albumin: 2.1 g/dL — ABNORMAL LOW (ref 3.5–5.0)
Alkaline Phosphatase: 82 U/L (ref 38–126)
Anion gap: 6 (ref 5–15)
BUN: 55 mg/dL — ABNORMAL HIGH (ref 8–23)
CO2: 22 mmol/L (ref 22–32)
Calcium: 7.6 mg/dL — ABNORMAL LOW (ref 8.9–10.3)
Chloride: 109 mmol/L (ref 98–111)
Creatinine, Ser: 2.29 mg/dL — ABNORMAL HIGH (ref 0.44–1.00)
GFR, Estimated: 23 mL/min — ABNORMAL LOW (ref >60)
Glucose, Bld: 233 mg/dL — ABNORMAL HIGH (ref 70–99)
Potassium: 4.8 mmol/L (ref 3.5–5.1)
Sodium: 137 mmol/L (ref 135–145)
Total Bilirubin: 0.4 mg/dL (ref 0.0–1.2)
Total Protein: 4.5 g/dL — ABNORMAL LOW (ref 6.5–8.1)

## 2024-04-29 MED ORDER — ATORVASTATIN CALCIUM 40 MG PO TABS
40.0000 mg | ORAL_TABLET | Freq: Every day | ORAL | Status: DC
  Administered 2024-04-29 – 2024-05-05 (×7): 40 mg via ORAL
  Filled 2024-04-29 (×7): qty 1

## 2024-04-29 MED ORDER — HYDROCORTISONE SOD SUC (PF) 100 MG IJ SOLR
50.0000 mg | Freq: Two times a day (BID) | INTRAMUSCULAR | Status: DC
  Administered 2024-04-29 – 2024-04-30 (×2): 50 mg via INTRAVENOUS
  Filled 2024-04-29 (×2): qty 2

## 2024-04-29 NOTE — Progress Notes (Signed)
 PHARMACY - ANTICOAGULATION CONSULT NOTE  Pharmacy Consult for Heparin  Indication: chest pain/ACS  No Known Allergies  Patient Measurements: Height: 5' 3.5" (161.3 cm) Weight: 47 kg (103 lb 9.9 oz) IBW/kg (Calculated) : 53.55 HEPARIN  DW (KG): 47.3  Vital Signs: Temp: 98.8 F (37.1 C) (05/04 2000) Temp Source: Oral (05/04 2000) BP: 159/75 (05/04 2000) Pulse Rate: 81 (05/04 2000)  Labs: Recent Labs    04/28/24 0505 04/28/24 0812 04/28/24 1313 04/28/24 1553 04/28/24 1759 04/28/24 1800 04/28/24 2059 04/29/24 0509 04/29/24 1140 04/29/24 1608 04/29/24 2004  HGB 9.7*  --  8.5*  --   --   --   --  7.5*  --  7.3*  --   HCT 31.7*  --  25.7*  --   --   --   --  23.4*  --  22.5*  --   PLT 266  --  172  --   --   --   --  170  --  155  --   HEPARINUNFRC  --   --   --    < > 0.90*  --   --  0.56 0.40  --  0.29*  CREATININE 2.48*  --   --   --  2.57*  --   --  2.29*  --   --   --   TROPONINIHS 777*   < >  --    < >  --  5,036* 6,145* 3,816*  --   --   --    < > = values in this interval not displayed.    Estimated Creatinine Clearance: 18.4 mL/min (A) (by C-G formula based on SCr of 2.29 mg/dL (H)).   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   DDD (degenerative disc disease), lumbar    Diabetes mellitus without complication (HCC)    type 1   Diabetic ketoacidosis (HCC) 09/26/2020   Gait disorder    neurogenic   Gangrene of right foot (HCC)    Hypertension    Hypothyroid 12/08/2020   Neuropathy    Renal disease    Sacral decubitus ulcer, stage IV (HCC) 12/08/2020   Subacute osteomyelitis of right foot (HCC)     Medications:  Infusions:   azithromycin  Stopped (04/28/24 2226)   heparin  400 Units/hr (04/29/24 2044)   linezolid (ZYVOX) IV Stopped (04/29/24 1146)   piperacillin -tazobactam (ZOSYN )  IV Stopped (04/29/24 1449)     Assessment: 71 yoF admitted on 5/2 with sepsis.  Pharmacy is now consulted to dose Heparin  for ACS.   No prior to admission anticoagulation.  Heparin  SQ last dose on 5/3 at 5am.    Today, 04/29/2024: Heparin  level now slightly SUBtherapeutic on heparin  400 units/hr after being therapeutic earlier today CBC: Hgb continues to decrease to 7.3 but CCM Md states to continue IV heparin  note addressing CBC (Admit Hgb 11.4, previous baseline ~8-05 Jan 2024), Plt 170 RN reported rectal bleeding x1 on 5/3, MD aware, no further bleeding today.  Continue to monitor BMs for bleeding, CBC.   Troponin: admit 7, increased to peak 6145, now decreasing 3816  Goal of Therapy:  Heparin  level 0.3-0.7 units/ml Monitor platelets by anticoagulation protocol: Yes   Plan:  Increase Heparin  IV infusion from 400 units/hr to 450 units/hr Heparin  level 8 hours after increasing rate Daily heparin  level and CBC   Austine Lefort  Madaline Scales, PharmD, BCPS Secure Chat if ?s 04/29/2024 8:46 PM

## 2024-04-29 NOTE — Consult Note (Signed)
 WOC Nurse Consult Note: Reason for Consult: Stage 3 Pressure Injury Incidentally noted partial thickness wound mid back and left thigh partial thickness wound Wound type: Chronic Stage 3 sacrum; hyperkeratotic; macerated; unable to visualize wound bed Crusted area left thigh, unclear etiology; skin tear?; scabbed/crusted Partial thickness skin loss along the vertebral column; Stage 2 Pressure Injury based on location Pressure Injury POA: Yes Measurement: see nursing flow sheets Wound bed:see above Drainage (amount, consistency, odor) see nursing flow sheets Periwound: intact  Dressing procedure/placement/frequency: LALM while in the ICU Silver hydrofiber to the sacral wound Timm Foot [phone]), top with foam. Change every other day Single layer of xeroform to the thigh and vertebral wound, top with foam. Change every other day.    Re consult if needed, will not follow at this time. Thanks  Mickey Esguerra [employer], RN,CWOCN, CNS, CWON-AP [phone])

## 2024-04-29 NOTE — Plan of Care (Signed)
   Problem: Coping: Goal: Ability to adjust to condition or change in health will improve Outcome: Progressing   Problem: Nutritional: Goal: Maintenance of adequate nutrition will improve Outcome: Progressing

## 2024-04-29 NOTE — Progress Notes (Signed)
 PHARMACY - ANTICOAGULATION CONSULT NOTE  Pharmacy Consult for Heparin  Indication: chest pain/ACS  No Known Allergies  Patient Measurements: Height: 5' 3.5" (161.3 cm) Weight: 46.9 kg (103 lb 6.3 oz) IBW/kg (Calculated) : 53.55 HEPARIN  DW (KG): 47.3  Vital Signs: Temp: 97.8 F (36.6 C) (05/04 0000) Temp Source: Oral (05/04 0000) BP: 132/58 (05/04 0315) Pulse Rate: 74 (05/04 0315)  Labs: Recent Labs    04/27/24 1850 04/27/24 1855 04/28/24 0505 04/28/24 0812 04/28/24 1313 04/28/24 1553 04/28/24 1759 04/28/24 1800 04/28/24 2059 04/29/24 0509  HGB 11.4*  --  9.7*  --  8.5*  --   --   --   --  7.5*  HCT 35.9*  --  31.7*  --  25.7*  --   --   --   --  23.4*  PLT 276  --  266  --  172  --   --   --   --  170  HEPARINUNFRC  --   --   --   --   --   --  0.90*  --   --  0.56  CREATININE 2.50*  --  2.48*  --   --   --  2.57*  --   --   --   TROPONINIHS  --    < > 777*   < >  --  4,890*  --  5,036* 6,145*  --    < > = values in this interval not displayed.    Estimated Creatinine Clearance: 16.4 mL/min (A) (by C-G formula based on SCr of 2.57 mg/dL (H)).   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   DDD (degenerative disc disease), lumbar    Diabetes mellitus without complication (HCC)    type 1   Diabetic ketoacidosis (HCC) 09/26/2020   Gait disorder    neurogenic   Gangrene of right foot (HCC)    Hypertension    Hypothyroid 12/08/2020   Neuropathy    Renal disease    Sacral decubitus ulcer, stage IV (HCC) 12/08/2020   Subacute osteomyelitis of right foot (HCC)     Medications:  Infusions:   azithromycin  Stopped (04/28/24 2226)   heparin  400 Units/hr (04/29/24 0100)   linezolid (ZYVOX) IV Stopped (04/28/24 2216)   norepinephrine  (LEVOPHED ) Adult infusion 2 mcg/min (04/29/24 0100)   piperacillin -tazobactam (ZOSYN )   IV Stopped (04/29/24 0221)    Assessment: 41 yoF admitted on 5/2 with sepsis.  Pharmacy is now consulted to dose Heparin  for ACS.   No prior to admission anticoagulation.  Heparin  SQ last dose on 5/3 at 5am.   CBC: Hgb 9.7, Plt 266 Troponin: 7, 50, 356, 777, 1799, 4890, 6145  Heparin  level 0.56, therapeutic on 400 units/hr Hgb down to 7.5, plts 170 Per RN no bleeding overnight, pt hasn't had BM though  Goal of Therapy:  Heparin  level 0.3-0.7 units/ml Monitor platelets by anticoagulation protocol: Yes   Plan:  Continue heparin  drip at 400 units/hr Recheck heparin  level in 8 hours Daily heparin  level and CBC   Beau Bound RPh 04/29/2024, 5:35 AM

## 2024-04-29 NOTE — Progress Notes (Signed)
 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 The patient was to a routine doctor's appointment with her husband and after getting out of the appointment and getting in the car planning to go eat, patient suddenly became intensely nauseated with severe epigastric pain. She denies radiation to the back. She vomited 4 times of brown fluid. No diarrhea, f/c/r, CP, SOB, DOE, cough, wheezing, rash, flank pain, or dysuria. Her husband brought her to ED and BP was 67/46 in triage. She feels lightheaded. Her husband reports that she seemed to get kind of lethargic briefly. Patient denies any similar episodes. No smoking, alcohol  drinking, or illicit drug use.   Pertinent  Medical History  Three times renal transplant (last one in 2011), DM-1 (on insulin  pump), hypothyroidism, HTN, GERD, esophagitis, hiatal hernia, anemia of chronic illness  Significant Hospital Events: Including procedures, antibiotic start and stop dates in addition to other pertinent events   ED: Rx for hyperkalemia with Albuterol , Ca, and IV insulin . Received 3 L LR bolus. Started on Zosyn . Started Levophed  @ 10 mcg/min due to persistent hypotension   Interim History / Subjective:  She feels well this morning and wants to eat.   Objective   Blood pressure (!) 119/57, pulse 77, temperature 98 F (36.7 C), temperature source Oral, resp. rate 17, height 5' 3.5" (1.613 m), weight 46.9 kg, SpO2 90%.        Intake/Output Summary (Last 24 hours) at 04/29/2024 0716 Last data filed at 04/29/2024 4098 Gross per 24 hour  Intake 1718.61 ml  Output 950 ml  Net 768.61 ml   Filed Weights    04/28/24 0000 04/28/24 0016 04/28/24 0424  Weight: 47.3 kg 46.9 kg 46.9 kg    Examination: General: chronically ill appearing woman lying in bed in NAD HENT: Souris/At, eyes anicteric Lungs: breathing comfortably on RA Cardiovascular: S1S2, RRR Abdomen: soft, NT Extremities: mild ankle edema, RLE BKA Neuro: more awake and alert today, giving better history  Coox 72% Trop peak 6145> 3816 WBC 13.5 H/H 7.5/23.4 Platelets 170 BUN 55 Cr 2.29 AST 24 ALT 13 T bili 0.4 BG 70-300s MRCP: cholelithiasis w/o cholecystitis, 7mm stone in portahepatis in cystic duct vs less likely proximal CBD. Mild dictal dilation of intra and extrahepatic biliary ducts. Enteritis. Small pleural effusions and ascites.  Blood cultures: NGTD Urine culture: pending  Resolved Hospital Problem list   Hyperkalemia Lactic acidosis  Assessment & Plan:  Septic shock, possibly due to cholecystitis, cholangitis. She has history of pancreas transplant many years ago, so unsure what anatomic changes may be expected with this.  With chronic foley worry about UTI, Ct concerning for possible lower lobe infiltrates. Immunocompromised.  Possible enteritis on CT. Lactic acidosis due to sepsis; no clear source identified other than enteritis.  No stool since yesterday afternoon. - Continue holding immunosuppressive meds - Continue broad-spectrum antibiotics - Continue to follow cultures - Off vasopressors - Appreciate GI's management  Acute metabolic encephalopathy due to sepsis -Antibiotics - Delirium precautions  Troponin elevation; possible inferior ischemia. Has significant calcification of coronary arteries.  Preserved Choloxin ejection fraction suggests against ACS as a potential cause for shock at presentation. -Cardiology consulted -  No additional troponin monitoring needed - Heparin , aspirin , statin  Constipation; solved -Bowel regimen as needed  At risk for adrenal insufficiency: on Prednisone  5 mg daily  for 20 yrs -stress dose steroids> decreasing to twice daily dosing with significant improvement in blood pressure  AKI, likely septic ATN Renal transplant (last one in 2011) with CKD 4, chronic foley -Strict I's/O - Renally dose meds and avoid nephrotoxic meds - Continue to monitor -Continue holding immunosuppressive meds-azathioprine , cyclosporine , prednisone .  Giving stress dose steroids which should help prevent any acute rejection  DM-1 , A1c 7.4 - Basal bolus insulin  was stopped while in the hospital.  Glargine 5 units daily plus mealtime insulin  and sliding scale insulin .  With her pump her basal rate is 0.25 units per hour forward 6 units total long-acting insulin . -sensitive SSI -1 unit with meals once she starts eating; need to go up with steroids -Goal blood glucose 140-180  Hypothyroidism -Continue PTA Synthroid   HTN -Continue holding PTA antihypertensives  GERD, esophagitis, hiatal hernia; likely very frequent reflux - Aspiration precautions, PPI  Anemia of chronic illness -Transfuse 1 unit PRBCs today for hemoglobin less than 7 - Continue to monitor, especially while on heparin   Possible pancreatic mass, but similar to previous CT -defer to OP workup as needed -can follow up with her transplant center [facility] [place])   Stable to transfer to SD status. TRH to assume care tomorrow.  Best Practice (right click and "Reselect all SmartList Selections" daily)   Diet/type: Regular consistency (see orders) DVT prophylaxis systemic heparin  Pressure ulcer(s): N/A GI prophylaxis: PPI Lines: Central line Foley:  Yes, and it is still needed-- chronic foley Code Status:  full code Last date of multidisciplinary goals of care discussion []   Labs   CBC: Recent Labs  Lab 04/27/24 1848 04/27/24 1850 04/28/24 0505 04/28/24 1313 04/29/24 0509  WBC  --  6.5 21.4* 14.8* 13.5*  HGB 12.6 11.4* 9.7* 8.5* 7.5*  HCT 37.0 35.9* 31.7* 25.7* 23.4*  MCV  --  102.9* 105.7*  100.8* 100.4*  PLT  --  276 266 172 170    Basic Metabolic Panel: Recent Labs  Lab 04/27/24 1848 04/27/24 1850 04/27/24 1855 04/28/24 0505 04/28/24 1759 04/29/24 0509  NA 133* 134*  --  135 135 137  K 7.1* 5.2*  --  4.3 4.5 4.8  CL 116* 109  --  107 104 109  CO2  --  12*  --  18* 21* 22  GLUCOSE 199* 200*  --  352* 110* 233*  BUN 91* 71*  --  63* 62* 55*  CREATININE 2.70* 2.50*  --  2.48* 2.57* 2.29*  CALCIUM   --  9.8  --  8.6* 8.3* 7.6*  MG  --   --  2.3 1.6*  --   --   PHOS  --   --  4.2 3.9  --   --    GFR: Estimated Creatinine Clearance: 18.4 mL/min (A) (by C-G formula based on SCr of 2.29 mg/dL (H)). Recent Labs  Lab 04/27/24 1850 04/28/24 0011 04/28/24 0250 04/28/24 0505 04/28/24 1313 04/28/24 1553 04/29/24 0509  PROCALCITON  --   --   --  8.97  --   --   --   WBC 6.5  --   --  21.4* 14.8*  --  13.5*  LATICACIDVEN  --  4.0* 3.2* 4.0*  --  1.6  --     Liver Function Tests: Recent Labs  Lab 04/27/24 1850 04/28/24 0505 04/29/24  0509  AST 25 29 24   ALT 15 16 13   ALKPHOS 187* 165* 82  BILITOT 0.7 0.5 0.4  PROT 6.7 5.1* 4.5*  ALBUMIN  3.2* 2.6* 2.1*   Recent Labs  Lab 04/27/24 1850 04/28/24 0505  LIPASE 43 36    ABG    Component Value Date/Time   HCO3 21.3 11/08/2020 1914   TCO2 13 (L) 04/27/2024 1848   ACIDBASEDEF 2.0 11/08/2020 1914   O2SAT 72 04/29/2024 0509        Critical care time:       Joesph Mussel, DO 04/29/24 9:21 AM [facility]  For contact information, see Amion. If no response to pager, please call PCCM consult pager. After hours, 7PM- 7AM, please call Elink.

## 2024-04-29 NOTE — Progress Notes (Signed)
 Subjective: Patient denies abdominal pain. Patient noted to have type VI, mushy bowel movement yesterday at 6 PM, brown, with mucus and red streaks.  Objective: Vital signs in last 24 hours: Temp:  [97.5 F (36.4 C)-98.5 F (36.9 C)] 98.5 F (36.9 C) (05/04 0806) Pulse Rate:  [72-87] 77 (05/04 0535) Resp:  [10-28] 17 (05/04 0535) BP: (84-151)/(44-78) 119/57 (05/04 0530) SpO2:  [90 %-100 %] 90 % (05/04 0535) Weight change:  Last BM Date : 04/28/24  PE: Nonicteric GENERAL: Hard of hearing but alert, awake, oriented x 3, not in distress ABDOMEN: Soft, nondistended, nontender EXTREMITIES: Right below-knee amputation  Lab Results: Results for orders placed or performed during the hospital encounter of 04/27/24 (from the past 48 hours)  CBG monitoring, ED     Status: Abnormal   Collection Time: 04/27/24  6:38 PM  Result Value Ref Range   Glucose-Capillary 194 (H) 70 - 99 mg/dL    Comment: Glucose reference range applies only to samples taken after fasting for at least 8 hours.  Culture, blood (Routine X 2) w Reflex to ID Panel     Status: None (Preliminary result)   Collection Time: 04/27/24  6:40 PM   Specimen: BLOOD  Result Value Ref Range   Specimen Description      BLOOD LEFT ANTECUBITAL Performed at [facility], [address], [place], [place] [address]    Special Requests      BOTTLES DRAWN AEROBIC AND ANAEROBIC Blood Culture results may not be optimal due to an inadequate volume of blood received in culture bottles Performed at [facility], [address] [place], [place] [address]    Culture      NO GROWTH 2 DAYS Performed at [facility], [address], [place], [place] [address]    Report Status PENDING   Culture, blood (Routine X 2) w Reflex to ID Panel     Status: None (Preliminary result)   Collection Time: 04/27/24  6:45 PM   Specimen: BLOOD  Result Value Ref Range   Specimen Description      BLOOD RIGHT  ANTECUBITAL Performed at [facility], [address] [place], [place] [address]    Special Requests      BOTTLES DRAWN AEROBIC AND ANAEROBIC Blood Culture results may not be optimal due to an inadequate volume of blood received in culture bottles Performed at [facility], [address], [place], [place] [address]    Culture      NO GROWTH 2 DAYS Performed at [facility], [address] [place], [place] [address]    Report Status PENDING   I-stat chem 8, ED (not at [facility] or [facility])     Status: Abnormal   Collection Time: 04/27/24  6:48 PM  Result Value Ref Range   Sodium 133 (L) 135 - 145 mmol/L   Potassium 7.1 (HH) 3.5 - 5.1 mmol/L   Chloride 116 (H) 98 - 111 mmol/L   BUN 91 (H) 8 - 23 mg/dL   Creatinine, Ser 5.28 (H) 0.44 - 1.00 mg/dL   Glucose, Bld 413 (H) 70 - 99 mg/dL    Comment: Glucose reference range applies only to samples taken after fasting for at least 8 hours.   Calcium , Ion 1.23 1.15 - 1.40 mmol/L   TCO2 13 (L) 22 - 32 mmol/L   Hemoglobin 12.6 12.0 - 15.0 g/dL   HCT 24.4 01.0 - 27.2 %   Comment  NOTIFIED PHYSICIAN   Lipase, blood     Status: None   Collection Time: 04/27/24  6:50 PM  Result Value Ref Range   Lipase 43 11 - 51 U/L    Comment: Performed at [facility], [address], [place], [place] [address]  Comprehensive metabolic panel     Status: Abnormal   Collection Time: 04/27/24  6:50 PM  Result Value Ref Range   Sodium 134 (L) 135 - 145 mmol/L   Potassium 5.2 (H) 3.5 - 5.1 mmol/L   Chloride 109 98 - 111 mmol/L   CO2 12 (L) 22 - 32 mmol/L   Glucose, Bld 200 (H) 70 - 99 mg/dL    Comment: Glucose reference range applies only to samples taken after fasting for at least 8 hours.   BUN 71 (H) 8 - 23 mg/dL   Creatinine, Ser 6.04 (H) 0.44 - 1.00 mg/dL   Calcium  9.8 8.9 - 10.3 mg/dL   Total Protein 6.7 6.5 - 8.1 g/dL   Albumin  3.2 (L) 3.5 - 5.0 g/dL   AST 25 15 - 41 U/L   ALT 15 0 - 44  U/L   Alkaline Phosphatase 187 (H) 38 - 126 U/L   Total Bilirubin 0.7 0.0 - 1.2 mg/dL   GFR, Estimated 21 (L) >60 mL/min    Comment: (NOTE) Calculated using the CKD-EPI Creatinine Equation (2021)    Anion gap 13 5 - 15    Comment: Performed at [facility], [address] [place], [place] [address]  CBC     Status: Abnormal   Collection Time: 04/27/24  6:50 PM  Result Value Ref Range   WBC 6.5 4.0 - 10.5 K/uL   RBC 3.49 (L) 3.87 - 5.11 MIL/uL   Hemoglobin 11.4 (L) 12.0 - 15.0 g/dL   HCT 11.9 (L) 14.7 - 82.9 %   MCV 102.9 (H) 80.0 - 100.0 fL   MCH 32.7 26.0 - 34.0 pg   MCHC 31.8 30.0 - 36.0 g/dL   RDW 56.2 13.0 - 86.5 %   Platelets 276 150 - 400 K/uL   nRBC 0.0 0.0 - 0.2 %    Comment: Performed at [facility], [address] [place], [place] [address]  Troponin I (High Sensitivity)     Status: None   Collection Time: 04/27/24  6:55 PM  Result Value Ref Range   Troponin I (High Sensitivity) 7 <18 ng/L    Comment: (NOTE) Elevated high sensitivity troponin I (hsTnI) values and significant  changes across serial measurements may suggest ACS but many other  chronic and acute conditions are known to elevate hsTnI results.  Refer to the "Links" section for chest pain algorithms and additional  guidance. Performed at [facility], [address] [place], [place] [address]   Magnesium      Status: None   Collection Time: 04/27/24  6:55 PM  Result Value Ref Range   Magnesium  2.3 1.7 - 2.4 mg/dL    Comment: Performed at [facility], [address] [place], [place] [address]  Phosphorus     Status: None   Collection Time: 04/27/24  6:55 PM  Result Value Ref Range   Phosphorus 4.2 2.5 - 4.6 mg/dL    Comment: Performed at [facility], [address], [place], [place] [address]  CBG monitoring, ED     Status: Abnormal   Collection Time: 04/27/24  9:11 PM  Result Value Ref Range  Glucose-Capillary 199 (H) 70 - 99 mg/dL    Comment: Glucose reference range applies only to samples taken after fasting for at least 8 hours.  CBG monitoring, ED     Status: Abnormal   Collection Time: 04/27/24 10:32 PM  Result Value Ref Range   Glucose-Capillary 271 (H) 70 - 99 mg/dL    Comment: Glucose reference range applies only to samples taken after fasting for at least 8 hours.  Lactic acid, plasma     Status: Abnormal   Collection Time: 04/28/24 12:11 AM  Result Value Ref Range   Lactic Acid, Venous 4.0 (HH) 0.5 - 1.9 mmol/L    Comment: CRITICAL RESULT CALLED TO, READ BACK BY AND VERIFIED WITH Rumalda Counter, RN [phone] 04/28/24 BY Gearld Keep Performed at [facility], [address] [place], [place] [address]   Troponin I (High Sensitivity)     Status: Abnormal   Collection Time: 04/28/24 12:11 AM  Result Value Ref Range   Troponin I (High Sensitivity) 50 (H) <18 ng/L    Comment: DELTA CHECK NOTED (NOTE) Elevated high sensitivity troponin I (hsTnI) values and significant  changes across serial measurements may suggest ACS but many other  chronic and acute conditions are known to elevate hsTnI results.  Refer to the "Links" section for chest pain algorithms and additional  guidance. Performed at [facility], [address], [place], [place] [address]   Glucose, capillary     Status: Abnormal   Collection Time: 04/28/24 12:50 AM  Result Value Ref Range   Glucose-Capillary 343 (H) 70 - 99 mg/dL    Comment: Glucose reference range applies only to samples taken after fasting for at least 8 hours.  MRSA Next Gen by PCR, Nasal     Status: None   Collection Time: 04/28/24 12:55 AM   Specimen: Nasal Mucosa; Nasal Swab  Result Value Ref Range   MRSA by PCR Next Gen NOT DETECTED NOT DETECTED    Comment: (NOTE) The GeneXpert MRSA Assay (FDA approved for NASAL specimens only), is one component of a comprehensive MRSA colonization surveillance program.  It is not intended to diagnose MRSA infection nor to guide or monitor treatment for MRSA infections. Test performance is not FDA approved in patients less than 11 years old. Performed at [facility], [address], [place], [place] [address]   Troponin I (High Sensitivity)     Status: Abnormal   Collection Time: 04/28/24  2:50 AM  Result Value Ref Range   Troponin I (High Sensitivity) 356 (HH) <18 ng/L    Comment: CRITICAL RESULT CALLED TO, READ BACK BY AND VERIFIED WITH Mitchell Ana, RN [phone] 04/28/24 BY V. NICANOR DELTA CHECK NOTED (NOTE) Elevated high sensitivity troponin I (hsTnI) values and significant  changes across serial measurements may suggest ACS but many other  chronic and acute conditions are known to elevate hsTnI results.  Refer to the "Links" section for chest pain algorithms and additional  guidance. Performed at [facility], [address], [place], [place] [address]   Lactic acid, plasma     Status: Abnormal   Collection Time: 04/28/24  2:50 AM  Result Value Ref Range   Lactic Acid, Venous 3.2 (HH) 0.5 - 1.9 mmol/L    Comment: CRITICAL VALUE NOTED. VALUE IS CONSISTENT WITH PREVIOUSLY REPORTED/CALLED VALUE Performed at [facility], [address], [place], [place] [address]   Glucose, capillary     Status: Abnormal   Collection  Time: 04/28/24  3:00 AM  Result Value Ref Range   Glucose-Capillary 337 (H) 70 - 99 mg/dL    Comment: Glucose reference range applies only to samples taken after fasting for at least 8 hours.  CBC     Status: Abnormal   Collection Time: 04/28/24  5:05 AM  Result Value Ref Range   WBC 21.4 (H) 4.0 - 10.5 K/uL   RBC 3.00 (L) 3.87 - 5.11 MIL/uL   Hemoglobin 9.7 (L) 12.0 - 15.0 g/dL   HCT 65.7 (L) 84.6 - 96.2 %   MCV 105.7 (H) 80.0 - 100.0 fL   MCH 32.3 26.0 - 34.0 pg   MCHC 30.6 30.0 - 36.0 g/dL   RDW 95.2 84.1 - 32.4 %   Platelets 266 150 - 400 K/uL   nRBC 0.0 0.0 - 0.2 %     Comment: Performed at [facility], [address] [place], [place] [address]  Magnesium      Status: Abnormal   Collection Time: 04/28/24  5:05 AM  Result Value Ref Range   Magnesium  1.6 (L) 1.7 - 2.4 mg/dL    Comment: Performed at [facility], [address], [place], [place] [address]  Phosphorus     Status: None   Collection Time: 04/28/24  5:05 AM  Result Value Ref Range   Phosphorus 3.9 2.5 - 4.6 mg/dL    Comment: Performed at [facility], [address] [place], [place] [address]  Basic metabolic panel     Status: Abnormal   Collection Time: 04/28/24  5:05 AM  Result Value Ref Range   Sodium 135 135 - 145 mmol/L   Potassium 4.3 3.5 - 5.1 mmol/L   Chloride 107 98 - 111 mmol/L   CO2 18 (L) 22 - 32 mmol/L   Glucose, Bld 352 (H) 70 - 99 mg/dL    Comment: Glucose reference range applies only to samples taken after fasting for at least 8 hours.   BUN 63 (H) 8 - 23 mg/dL   Creatinine, Ser 4.74 (H) 0.44 - 1.00 mg/dL   Calcium  8.6 (L) 8.9 - 10.3 mg/dL   GFR, Estimated 21 (L) >60 mL/min    Comment: (NOTE) Calculated using the CKD-EPI Creatinine Equation (2021)    Anion gap 10 5 - 15    Comment: Performed at [facility], [address], [place], [place] [address]  Hemoglobin A1c     Status: Abnormal   Collection Time: 04/28/24  5:05 AM  Result Value Ref Range   Hgb A1c MFr Bld 7.4 (H) 4.8 - 5.6 %    Comment: (NOTE) Pre diabetes:          5.7%-6.4%  Diabetes:              >6.4%  Glycemic control for   <7.0% adults with diabetes    Mean Plasma Glucose 165.68 mg/dL    Comment: Performed at [facility], [address], [place], [place] [address]  Hepatic function panel     Status: Abnormal   Collection Time: 04/28/24  5:05 AM  Result Value Ref Range   Total Protein 5.1 (L) 6.5 - 8.1 g/dL   Albumin  2.6 (L) 3.5 - 5.0 g/dL   AST 29 15 - 41 U/L   ALT 16 0 - 44 U/L   Alkaline Phosphatase  165 (H) 38 - 126 U/L   Total Bilirubin 0.5 0.0 - 1.2 mg/dL   Bilirubin, Direct 0.1 0.0 -  0.2 mg/dL   Indirect Bilirubin 0.4 0.3 - 0.9 mg/dL    Comment: Performed at [facility], [address] [place], [place] [address]  Lipase, blood     Status: None   Collection Time: 04/28/24  5:05 AM  Result Value Ref Range   Lipase 36 11 - 51 U/L    Comment: Performed at [facility], [address] [place], [place] [address]  Procalcitonin     Status: None   Collection Time: 04/28/24  5:05 AM  Result Value Ref Range   Procalcitonin 8.97 ng/mL    Comment:        Interpretation: PCT > 2 ng/mL: Systemic infection (sepsis) is likely, unless other causes are known. (NOTE)       Sepsis PCT Algorithm           Lower Respiratory Tract                                      Infection PCT Algorithm    ----------------------------     ----------------------------         PCT < 0.25 ng/mL                PCT < 0.10 ng/mL          Strongly encourage             Strongly discourage   discontinuation of antibiotics    initiation of antibiotics    ----------------------------     -----------------------------       PCT 0.25 - 0.50 ng/mL            PCT 0.10 - 0.25 ng/mL               OR       >80% decrease in PCT            Discourage initiation of                                            antibiotics      Encourage discontinuation           of antibiotics    ----------------------------     -----------------------------         PCT >= 0.50 ng/mL              PCT 0.26 - 0.50 ng/mL               AND       <80% decrease in PCT              Encourage initiation of                                             antibiotics       Encourage continuation           of antibiotics    ----------------------------     -----------------------------        PCT >= 0.50 ng/mL                  PCT > 0.50 ng/mL  AND         increase in PCT                  Strongly  encourage                                      initiation of antibiotics    Strongly encourage escalation           of antibiotics                                     -----------------------------                                           PCT <= 0.25 ng/mL                                                 OR                                        > 80% decrease in PCT                                      Discontinue / Do not initiate                                             antibiotics  Performed at [facility], [address] [place], [place] [address]   Lactic acid, plasma     Status: Abnormal   Collection Time: 04/28/24  5:05 AM  Result Value Ref Range   Lactic Acid, Venous 4.0 (HH) 0.5 - 1.9 mmol/L    Comment: CRITICAL VALUE NOTED. VALUE IS CONSISTENT WITH PREVIOUSLY REPORTED/CALLED VALUE Performed at [facility], [address] [place], [place] [address]   Troponin I (High Sensitivity)     Status: Abnormal   Collection Time: 04/28/24  5:05 AM  Result Value Ref Range   Troponin I (High Sensitivity) 777 (HH) <18 ng/L    Comment: DELTA CHECK NOTED CRITICAL VALUE NOTED. VALUE IS CONSISTENT WITH PREVIOUSLY REPORTED/CALLED VALUE (NOTE) Elevated high sensitivity troponin I (hsTnI) values and significant  changes across serial measurements may suggest ACS but many other  chronic and acute conditions are known to elevate hsTnI results.  Refer to the "Links" section for chest pain algorithms and additional  guidance. Performed at [facility], [address], [place], [place] [address]   Troponin I (High Sensitivity)     Status: Abnormal   Collection Time: 04/28/24  8:12 AM  Result Value Ref Range   Troponin I (High Sensitivity) 1,799 (HH) <18 ng/L    Comment: DELTA CHECK NOTED CRITICAL RESULT CALLED TO, READ BACK BY AND VERIFIED WITH J. SMALL,RN ON 04/28/2024 AT 0908 AM BY  SL (NOTE) Elevated high sensitivity  troponin I (hsTnI) values and significant  changes across serial measurements may suggest ACS but many other  chronic and acute conditions are known to elevate hsTnI results.  Refer to the "Links" section for chest pain algorithms and additional  guidance. Performed at [facility], [address] [place], [place] [address]   Glucose, capillary     Status: Abnormal   Collection Time: 04/28/24  8:29 AM  Result Value Ref Range   Glucose-Capillary 296 (H) 70 - 99 mg/dL    Comment: Glucose reference range applies only to samples taken after fasting for at least 8 hours.  Troponin I (High Sensitivity)     Status: Abnormal   Collection Time: 04/28/24  9:44 AM  Result Value Ref Range   Troponin I (High Sensitivity) 2,792 (HH) <18 ng/L    Comment: CRITICAL RESULT CALLED TO, READ BACK BY AND VERIFIED WITH J. SMALL,RN ON 04/28/2024 AT 1114 AM BY SL (NOTE) Elevated high sensitivity troponin I (hsTnI) values and significant  changes across serial measurements may suggest ACS but many other  chronic and acute conditions are known to elevate hsTnI results.  Refer to the "Links" section for chest pain algorithms and additional  guidance. Performed at [facility], [address], [place], [place] [address]   Cooxemetry Panel (carboxy, met, total hgb, O2 sat)     Status: Abnormal   Collection Time: 04/28/24  9:44 AM  Result Value Ref Range   Total hemoglobin 9.5 (L) 12.0 - 16.0 g/dL   O2 Saturation 91.4 %   Carboxyhemoglobin 1.7 (H) 0.5 - 1.5 %   Methemoglobin <0.7 0.0 - 1.5 %    Comment: Performed at [facility], [address] [place], [place] [address]  Glucose, capillary     Status: Abnormal   Collection Time: 04/28/24 11:38 AM  Result Value Ref Range   Glucose-Capillary 307 (H) 70 - 99 mg/dL    Comment: Glucose reference range applies only to samples taken after fasting for at least 8 hours.   Comment 1 Notify RN    Comment 2  Document in Chart   Urinalysis, Routine w reflex microscopic -Urine, Clean Catch     Status: Abnormal   Collection Time: 04/28/24  1:12 PM  Result Value Ref Range   Color, Urine YELLOW YELLOW   APPearance HAZY (A) CLEAR   Specific Gravity, Urine 1.009 1.005 - 1.030   pH 5.0 5.0 - 8.0   Glucose, UA 150 (A) NEGATIVE mg/dL   Hgb urine dipstick SMALL (A) NEGATIVE   Bilirubin Urine NEGATIVE NEGATIVE   Ketones, ur NEGATIVE NEGATIVE mg/dL   Protein, ur NEGATIVE NEGATIVE mg/dL   Nitrite NEGATIVE NEGATIVE   Leukocytes,Ua SMALL (A) NEGATIVE   RBC / HPF 0-5 0 - 5 RBC/hpf   WBC, UA 6-10 0 - 5 WBC/hpf   Bacteria, UA RARE (A) NONE SEEN   Squamous Epithelial / HPF 0-5 0 - 5 /HPF    Comment: Performed at [facility], [address] [place], [place] [address]  Strep pneumoniae urinary antigen     Status: None   Collection Time: 04/28/24  1:12 PM  Result Value Ref Range   Strep Pneumo Urinary Antigen NEGATIVE NEGATIVE    Comment:        Infection due to S. pneumoniae cannot be absolutely ruled out since the antigen present may be below the detection limit of the test. Performed at [facility], [address] [place], [place] [address]   CBC     Status: Abnormal   Collection Time: 04/28/24  1:13 PM  Result Value Ref Range   WBC 14.8 (H) 4.0 - 10.5 K/uL   RBC 2.55 (L) 3.87 - 5.11 MIL/uL   Hemoglobin 8.5 (L) 12.0 - 15.0 g/dL   HCT 60.4 (L) 54.0 - 98.1 %   MCV 100.8 (H) 80.0 - 100.0 fL   MCH 33.3 26.0 - 34.0 pg   MCHC 33.1 30.0 - 36.0 g/dL   RDW 19.1 47.8 - 29.5 %   Platelets 172 150 - 400 K/uL   nRBC 0.0 0.0 - 0.2 %    Comment: Performed at [facility], [address], [place], [place] [address]  Lactic acid, plasma     Status: None   Collection Time: 04/28/24  3:53 PM  Result Value Ref Range   Lactic Acid, Venous 1.6 0.5 - 1.9 mmol/L    Comment: Performed at [facility], [address], [place], [place] [address]   Troponin I (High Sensitivity)     Status: Abnormal   Collection Time: 04/28/24  3:53 PM  Result Value Ref Range   Troponin I (High Sensitivity) 4,890 (HH) <18 ng/L    Comment: CRITICAL RESULT CALLED TO, READ BACK BY AND VERIFIED WITH D. BLOCK,RN ON 04/28/2024 AT 1653 BY SL (NOTE) Elevated high sensitivity troponin I (hsTnI) values and significant  changes across serial measurements may suggest ACS but many other  chronic and acute conditions are known to elevate hsTnI results.  Refer to the "Links" section for chest pain algorithms and additional  guidance. Performed at [facility], [address] [place], [place] [address]   Glucose, capillary     Status: Abnormal   Collection Time: 04/28/24  5:46 PM  Result Value Ref Range   Glucose-Capillary 119 (H) 70 - 99 mg/dL    Comment: Glucose reference range applies only to samples taken after fasting for at least 8 hours.   Comment 1 Notify RN    Comment 2 Document in Chart   Heparin  level (unfractionated)     Status: Abnormal   Collection Time: 04/28/24  5:59 PM  Result Value Ref Range   Heparin  Unfractionated 0.90 (H) 0.30 - 0.70 IU/mL    Comment: (NOTE) The clinical reportable range upper limit is being lowered to >1.10 to align with the FDA approved guidance for the current laboratory assay.  If heparin  results are below expected values, and patient dosage has  been confirmed, suggest follow up testing of antithrombin III levels. Performed at [facility], [address] [place], [place] [address]   Basic metabolic panel     Status: Abnormal   Collection Time: 04/28/24  5:59 PM  Result Value Ref Range   Sodium 135 135 - 145 mmol/L   Potassium 4.5 3.5 - 5.1 mmol/L   Chloride 104 98 - 111 mmol/L   CO2 21 (L) 22 - 32 mmol/L   Glucose, Bld 110 (H) 70 - 99 mg/dL    Comment: Glucose reference range applies only to samples taken after fasting for at least 8 hours.   BUN 62 (H) 8 - 23  mg/dL   Creatinine, Ser 1.32 (H) 0.44 - 1.00 mg/dL   Calcium  8.3 (L) 8.9 - 10.3 mg/dL   GFR, Estimated 20 (L) >60 mL/min    Comment: (NOTE) Calculated using the CKD-EPI Creatinine Equation (2021)  Anion gap 10 5 - 15    Comment: Performed at [facility], [address], [place], [place] [address]  Troponin I (High Sensitivity)     Status: Abnormal   Collection Time: 04/28/24  6:00 PM  Result Value Ref Range   Troponin I (High Sensitivity) 5,036 (HH) <18 ng/L    Comment: DELTA CHECK NOTED CRITICAL VALUE NOTED. VALUE IS CONSISTENT WITH PREVIOUSLY REPORTED/CALLED VALUE (NOTE) Elevated high sensitivity troponin I (hsTnI) values and significant  changes across serial measurements may suggest ACS but many other  chronic and acute conditions are known to elevate hsTnI results.  Refer to the "Links" section for chest pain algorithms and additional  guidance. Performed at [facility], [address], [place], [place] [address]   Glucose, capillary     Status: None   Collection Time: 04/28/24  8:34 PM  Result Value Ref Range   Glucose-Capillary 73 70 - 99 mg/dL    Comment: Glucose reference range applies only to samples taken after fasting for at least 8 hours.  Troponin I (High Sensitivity)     Status: Abnormal   Collection Time: 04/28/24  8:59 PM  Result Value Ref Range   Troponin I (High Sensitivity) 6,145 (HH) <18 ng/L    Comment: DELTA CHECK NOTED CRITICAL VALUE NOTED. VALUE IS CONSISTENT WITH PREVIOUSLY REPORTED/CALLED VALUE (NOTE) Elevated high sensitivity troponin I (hsTnI) values and significant  changes across serial measurements may suggest ACS but many other  chronic and acute conditions are known to elevate hsTnI results.  Refer to the "Links" section for chest pain algorithms and additional  guidance. Performed at [facility], [address] [place], [place] [address]   Glucose, capillary     Status:  Abnormal   Collection Time: 04/29/24 12:42 AM  Result Value Ref Range   Glucose-Capillary 170 (H) 70 - 99 mg/dL    Comment: Glucose reference range applies only to samples taken after fasting for at least 8 hours.  Glucose, capillary     Status: Abnormal   Collection Time: 04/29/24  4:04 AM  Result Value Ref Range   Glucose-Capillary 193 (H) 70 - 99 mg/dL    Comment: Glucose reference range applies only to samples taken after fasting for at least 8 hours.  Comprehensive metabolic panel     Status: Abnormal   Collection Time: 04/29/24  5:09 AM  Result Value Ref Range   Sodium 137 135 - 145 mmol/L   Potassium 4.8 3.5 - 5.1 mmol/L   Chloride 109 98 - 111 mmol/L   CO2 22 22 - 32 mmol/L   Glucose, Bld 233 (H) 70 - 99 mg/dL    Comment: Glucose reference range applies only to samples taken after fasting for at least 8 hours.   BUN 55 (H) 8 - 23 mg/dL   Creatinine, Ser 8.46 (H) 0.44 - 1.00 mg/dL   Calcium  7.6 (L) 8.9 - 10.3 mg/dL   Total Protein 4.5 (L) 6.5 - 8.1 g/dL   Albumin  2.1 (L) 3.5 - 5.0 g/dL   AST 24 15 - 41 U/L   ALT 13 0 - 44 U/L   Alkaline Phosphatase 82 38 - 126 U/L   Total Bilirubin 0.4 0.0 - 1.2 mg/dL   GFR, Estimated 23 (L) >60 mL/min    Comment: (NOTE) Calculated using the CKD-EPI Creatinine Equation (2021)    Anion gap 6 5 - 15    Comment: Performed at [facility], [address], [place], [place] [address]  Cooxemetry Panel (carboxy, met, total hgb, O2 sat)     Status: Abnormal   Collection Time: 04/29/24  5:09 AM  Result Value Ref Range   Total hemoglobin 7.8 (L) 12.0 - 16.0 g/dL   O2 Saturation 72 %   Carboxyhemoglobin 1.7 (H) 0.5 - 1.5 %   Methemoglobin <0.7 0.0 - 1.5 %    Comment: Performed at [facility], [address] [place], [place] [address]  CBC     Status: Abnormal   Collection Time: 04/29/24  5:09 AM  Result Value Ref Range   WBC 13.5 (H) 4.0 - 10.5 K/uL   RBC 2.33 (L) 3.87 - 5.11 MIL/uL   Hemoglobin 7.5  (L) 12.0 - 15.0 g/dL   HCT 09.8 (L) 11.9 - 14.7 %   MCV 100.4 (H) 80.0 - 100.0 fL   MCH 32.2 26.0 - 34.0 pg   MCHC 32.1 30.0 - 36.0 g/dL   RDW 82.9 56.2 - 13.0 %   Platelets 170 150 - 400 K/uL   nRBC 0.0 0.0 - 0.2 %    Comment: Performed at [facility], [address] [place], [place] [address]  Heparin  level (unfractionated)     Status: None   Collection Time: 04/29/24  5:09 AM  Result Value Ref Range   Heparin  Unfractionated 0.56 0.30 - 0.70 IU/mL    Comment: (NOTE) The clinical reportable range upper limit is being lowered to >1.10 to align with the FDA approved guidance for the current laboratory assay.  If heparin  results are below expected values, and patient dosage has  been confirmed, suggest follow up testing of antithrombin III levels. Performed at [facility], [address] [place], [place] [address]   Troponin I (High Sensitivity)     Status: Abnormal   Collection Time: 04/29/24  5:09 AM  Result Value Ref Range   Troponin I (High Sensitivity) 3,816 (HH) <18 ng/L    Comment: DELTA CHECK NOTED CRITICAL VALUE NOTED. VALUE IS CONSISTENT WITH PREVIOUSLY REPORTED/CALLED VALUE (NOTE) Elevated high sensitivity troponin I (hsTnI) values and significant  changes across serial measurements may suggest ACS but many other  chronic and acute conditions are known to elevate hsTnI results.  Refer to the "Links" section for chest pain algorithms and additional  guidance. Performed at [facility], [address], [place], [place] [address]   Glucose, capillary     Status: Abnormal   Collection Time: 04/29/24  8:06 AM  Result Value Ref Range   Glucose-Capillary 182 (H) 70 - 99 mg/dL    Comment: Glucose reference range applies only to samples taken after fasting for at least 8 hours.   Comment 1 Notify RN     Studies/Results: MR ABDOMEN MRCP WO CONTRAST Result Date: 04/28/2024 CLINICAL DATA:  Elevated bilirubin  level.  Cholelithiasis. EXAM: MRI ABDOMEN WITHOUT CONTRAST  (INCLUDING MRCP) TECHNIQUE: Multiplanar multisequence MR imaging of the abdomen was performed. Heavily T2-weighted images of the biliary and pancreatic ducts were obtained, and three-dimensional MRCP images were rendered by post processing. COMPARISON:  Ultrasound earlier in the day.  CT yesterday FINDINGS: Mild to moderate motion degradation throughout. The MRCP images are especially motion degraded. Lower chest: Cardiomegaly.  Tiny bilateral pleural effusions. Hepatobiliary: No dominant liver lesion. The gallbladder is borderline distended and multiple dependent stones are seen. No pericholecystic edema or wall thickening identified. A 7 mm stone in the porta hepatis including on 19/13 and 12/3 is  difficult to localize. Favored to be within the cystic duct. Mild intrahepatic biliary duct dilatation. The common duct is mildly dilated in the porta hepatis including at 1.0 cm on 11/03 and 32/11. Tapers distally, without distal obstructive stone. Pancreas: Pancreatic atrophy. Borderline pancreatic duct dilatation, followed to the level of the ampulla. No dominant pancreatic mass or correlate for the questioned CT abnormality. Spleen:  Iron deposition within. Adrenals/Urinary Tract: Normal adrenal glands. Marked bilateral renal atrophy. Stomach/Bowel: Jejunal wall thickening and mesenteric edema again identified including on 21/10. Small hiatal hernia. Proximal gastric underdistention. Scattered colonic diverticula. Vascular/Lymphatic: Normal caliber of the aorta and branch vessels. No retroperitoneal or retrocrural adenopathy. Other: Small volume abdominal ascites including in the left upper quadrant. Musculoskeletal: S shaped thoracolumbar spine curvature. IMPRESSION: 1. Mild to moderate motion degradation. 2. Cholelithiasis without specific evidence of acute cholecystitis. 3. Stone of 7 mm in the porta hepatis is favored to be in the cystic duct, less  likely the proximal common duct. Localization not possible secondary to motion. 4. Intra and extrahepatic biliary duct dilatation is mild, followed to the level of the ampulla. Given elevated bilirubin, consider ERCP to exclude occult ampullary stenosis or mass. 5. Proximal enteritis, as on CT 6. Trace bilateral pleural effusions and small volume abdominal ascites. Electronically Signed   By: Lore Rode M.D.   On: 04/28/2024 17:37   ECHOCARDIOGRAM COMPLETE Result Date: 04/28/2024    ECHOCARDIOGRAM REPORT   Patient Name:   [name] Date of Exam: 04/28/2024 Medical Rec #:  [id number]       Height:       63.5 in Accession #:    [id number]      Weight:       103.4 lb Date of Birth:  1961/09/29      BSA:          1.469 m Patient Age:    62 years        BP:           140/63 mmHg Patient Gender: F               HR:           86 bpm. Exam Location:  Inpatient Procedure: 2D Echo, Cardiac Doppler and Color Doppler (Both Spectral and Color            Flow Doppler were utilized during procedure). Indications:    Shock  History:        Patient has prior history of Echocardiogram examinations, most                 recent 01/13/2024. Signs/Symptoms:Bacteremia; Risk                 Factors:Diabetes. Kidney and pancreas transplant per patient.  Sonographer:    Raynelle Callow RDCS Referring Phys: [id number] OMAR Macario Savin  Sonographer Comments: Technically difficult study due to poor echo windows, suboptimal parasternal window and suboptimal apical window. GI consult started during apicals, patient angled back to discuss history with MD. IMPRESSIONS  1. Left ventricular ejection fraction, by estimation, is 65 to 70%. The left ventricle has normal function. The left ventricle has no regional wall motion abnormalities. There is mild asymmetric left ventricular hypertrophy of the basal and septal segments. Left ventricular diastolic parameters were normal.  2. Right ventricular systolic function is normal. The right ventricular size  is normal. Tricuspid regurgitation signal is inadequate for assessing PA pressure.  3. The mitral valve is abnormal. Trivial mitral valve  regurgitation. No evidence of mitral stenosis.  4. The aortic valve is tricuspid. There is mild calcification of the aortic valve. There is mild thickening of the aortic valve. Aortic valve regurgitation is mild. Aortic valve sclerosis is present, with no evidence of aortic valve stenosis.  5. The inferior vena cava is normal in size with greater than 50% respiratory variability, suggesting right atrial pressure of 3 mmHg. FINDINGS  Left Ventricle: Left ventricular ejection fraction, by estimation, is 65 to 70%. The left ventricle has normal function. The left ventricle has no regional wall motion abnormalities. Strain was performed and the global longitudinal strain is indeterminate. The left ventricular internal cavity size was normal in size. There is mild asymmetric left ventricular hypertrophy of the basal and septal segments. Left ventricular diastolic parameters were normal. Right Ventricle: The right ventricular size is normal. No increase in right ventricular wall thickness. Right ventricular systolic function is normal. Tricuspid regurgitation signal is inadequate for assessing PA pressure. Left Atrium: Left atrial size was normal in size. Right Atrium: Right atrial size was normal in size. Pericardium: There is no evidence of pericardial effusion. Mitral Valve: The mitral valve is abnormal. There is mild thickening of the mitral valve leaflet(s). There is mild calcification of the mitral valve leaflet(s). Mild mitral annular calcification. Trivial mitral valve regurgitation. No evidence of mitral valve stenosis. Tricuspid Valve: The tricuspid valve is normal in structure. Tricuspid valve regurgitation is not demonstrated. No evidence of tricuspid stenosis. Aortic Valve: The aortic valve is tricuspid. There is mild calcification of the aortic valve. There is mild  thickening of the aortic valve. Aortic valve regurgitation is mild. Aortic regurgitation PHT measures 317 msec. Aortic valve sclerosis is present,  with no evidence of aortic valve stenosis. Aortic valve mean gradient measures 5.5 mmHg. Aortic valve peak gradient measures 9.0 mmHg. Aortic valve area, by VTI measures 2.15 cm. Pulmonic Valve: The pulmonic valve was normal in structure. Pulmonic valve regurgitation is trivial. No evidence of pulmonic stenosis. Aorta: The aortic root is normal in size and structure. Venous: The inferior vena cava is normal in size with greater than 50% respiratory variability, suggesting right atrial pressure of 3 mmHg. IAS/Shunts: No atrial level shunt detected by color flow Doppler. Additional Comments: 3D was performed not requiring image post processing on an independent workstation and was indeterminate.  LEFT VENTRICLE PLAX 2D LVIDd:         3.30 cm     Diastology LVIDs:         2.00 cm     LV e' medial:    4.24 cm/s LV PW:         1.10 cm     LV E/e' medial:  19.3 LV IVS:        1.00 cm     LV e' lateral:   6.53 cm/s LVOT diam:     2.00 cm     LV E/e' lateral: 12.5 LV SV:         65 LV SV Index:   44 LVOT Area:     3.14 cm  LV Volumes (MOD) LV vol d, MOD A2C: 63.3 ml LV vol d, MOD A4C: 59.0 ml LV vol s, MOD A2C: 13.9 ml LV vol s, MOD A4C: 15.5 ml LV SV MOD A2C:     49.4 ml LV SV MOD A4C:     59.0 ml LV SV MOD BP:      47.3 ml RIGHT VENTRICLE  IVC RV S prime:     20.50 cm/s  IVC diam: 1.30 cm TAPSE (M-mode): 1.7 cm LEFT ATRIUM             Index        RIGHT ATRIUM          Index LA diam:        3.00 cm 2.04 cm/m   RA Area:     5.29 cm LA Vol (A2C):   19.5 ml 13.27 ml/m  RA Volume:   7.96 ml  5.42 ml/m LA Vol (A4C):   19.8 ml 13.47 ml/m LA Biplane Vol: 20.6 ml 14.02 ml/m  AORTIC VALVE                     PULMONIC VALVE AV Area (Vmax):    2.32 cm      PR End Diast Vel: 1.89 msec AV Area (Vmean):   2.21 cm AV Area (VTI):     2.15 cm AV Vmax:           150.00  cm/s AV Vmean:          107.000 cm/s AV VTI:            0.301 m AV Peak Grad:      9.0 mmHg AV Mean Grad:      5.5 mmHg LVOT Vmax:         111.00 cm/s LVOT Vmean:        75.400 cm/s LVOT VTI:          0.206 m LVOT/AV VTI ratio: 0.68 AI PHT:            317 msec  AORTA Ao Root diam: 3.10 cm Ao Asc diam:  3.40 cm MITRAL VALVE MV Area (PHT): 4.40 cm     SHUNTS MV Decel Time: 173 msec     Systemic VTI:  0.21 m MV E velocity: 81.85 cm/s   Systemic Diam: 2.00 cm MV A velocity: 110.70 cm/s MV E/A ratio:  0.74 Janelle Mediate MD Electronically signed by Janelle Mediate MD Signature Date/Time: 04/28/2024/9:21:11 AM    Final    US  Abdomen Limited RUQ (LIVER/GB) Result Date: 04/28/2024 CLINICAL DATA:  Hyperbilirubinemia EXAM: ULTRASOUND ABDOMEN LIMITED RIGHT UPPER QUADRANT COMPARISON:  CT from the previous day FINDINGS: Gallbladder: Physiologically distended. Wall thickness is normal, 2.7 mm. Cluster of calculi measuring up to 8 mm in the dependent aspect. Sonographer reports no sonographic Wintle sign. Common bile duct: Diameter: 9 mm. 6 mm echogenic focus in the mid common bile duct, without caliber change of the duct. Liver: No focal lesion identified. Within normal limits in parenchymal echogenicity. Portal vein is patent on color Doppler imaging with normal direction of blood flow towards the liver. Other: Technologist describes technically difficult study secondary to lack of patient cooperation. IMPRESSION: 1. Cholelithiasis without sonographic evidence of acute cholecystitis. 2. 6 mm echogenic focus in the mid common bile duct, without caliber change of the duct, possibly nonobstructive choledocholithiasis. Electronically Signed   By: Nicoletta Barrier M.D.   On: 04/28/2024 08:26   DG Chest Portable 1 View Result Date: 04/27/2024 CLINICAL DATA:  Central line placement EXAM: PORTABLE CHEST 1 VIEW COMPARISON:  Chest x-ray 04/27/2024 FINDINGS: Left-sided central venous catheter tip projects over the distal SVC. Multiple surgical  clips overlie the left axilla, unchanged. Heart is mildly enlarged. The lungs are clear. There is no pleural effusion or pneumothorax. IMPRESSION: Left-sided central venous catheter tip projects over the distal SVC.  No pneumothorax. Electronically Signed   By: Tyron Gallon M.D.   On: 04/27/2024 22:02   CT CHEST ABDOMEN PELVIS WO CONTRAST Result Date: 04/27/2024 CLINICAL DATA:  Sepsis EXAM: CT CHEST, ABDOMEN AND PELVIS WITHOUT CONTRAST TECHNIQUE: Multidetector CT imaging of the chest, abdomen and pelvis was performed following the standard protocol without IV contrast. RADIATION DOSE REDUCTION: This exam was performed according to the departmental dose-optimization program which includes automated exposure control, adjustment of the mA and/or kV according to patient size and/or use of iterative reconstruction technique. COMPARISON:  CT abdomen and pelvis 12/17/2021. FINDINGS: CT CHEST FINDINGS Cardiovascular: No significant vascular findings. The heart is mildly enlarged. No pericardial effusion. There are atherosclerotic calcifications of the aorta and coronary arteries. Mediastinum/Nodes: There is a small air-fluid level throughout the esophagus. A small hiatal hernia is present. There is questionable distal esophageal wall thickening. No enlarged lymph nodes are identified. Visualized thyroid  gland is within normal limits. Lungs/Pleura: Patchy ground-glass and linear airspace opacities are seen in the bilateral lower lobes. There also some ground-glass opacities in the lingula. The lungs are otherwise clear. There is no pleural effusion or pneumothorax. Musculoskeletal: There is trace compression deformity the superior endplate T4 T8 and T11. No acute fracture lines are present. CT ABDOMEN PELVIS FINDINGS Hepatobiliary: Gallstones are present. There is minimal pericholecystic fluid. There is mild intrahepatic biliary ductal dilatation. Common bile duct appears dilated. Questionable choledocholithiasis measuring  9 mm image 2/49. No focal liver lesions are seen. Pancreas: Atrophic, unchanged. Not well evaluated on this noncontrast study. Questionable rounded hypodense lesion in the head of the pancreas image 2/58 measuring 1 cm, mildly increased in size from prior. Spleen: Normal in size without focal abnormality. Adrenals/Urinary Tract: The adrenal glands are within normal limits. There is marked bilateral atrophy of the native kidneys, unchanged. There is a transplanted left lower quadrant kidney. There is minimal stranding surrounding the posterior aspect of the transplanted kidney, unchanged from prior. There is no hydronephrosis. There are cysts in the left kidney as seen on the prior study. There are few punctate calcifications in the left kidney which may be vascular, new from prior. The bladder is decompressed by Foley catheter. Stomach/Bowel: No dilated bowel loops are seen. There is a large amount of stool throughout the entire colon. The appendix is visualized and appears normal. There is some wall thickening and mesenteric edema involving small bowel loops in the left abdomen. The stomach is nondilated. Vascular/Lymphatic: There are severe atherosclerotic calcifications of the aorta and peripheral vessels. No evidence for aortic aneurysm. 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 There are multiple surgical clips along the pelvic sidewalls. No definitive enlarged lymph nodes identified. Reproductive: Uterus not definitely seen. Other: There is presacral edema and trace free fluid in the pelvis. There is scarring in the anterior abdominal wall. Musculoskeletal: The bones are osteopenic. There is trace compression deformity of the superior endplate of L1 which is chronic and unchanged. Degenerative changes affect the spine. Right hip arthroplasty is present. IMPRESSION: 1. Patchy ground-glass and linear airspace opacities in the bilateral lower lobes and lingula  worrisome for multifocal pneumonia. 2. Small hiatal hernia with air-fluid level in the esophagus. Questionable distal esophageal wall thickening. Correlate clinically for esophagitis. 3. Cholelithiasis with minimal pericholecystic fluid. Mild intrahepatic and extrahepatic biliary ductal dilatation with questionable choledocholithiasis. Correlate clinically for cholecystitis/biliary obstruction. Consider further evaluation with MRCP or ERCP. 4. Questionable rounded hypodense lesion in the head of the pancreas measuring  1 cm, mildly increased in size from prior. Recommend further evaluation with MRI/MRCP. 5. Wall thickening and mesenteric edema involving small bowel loops in the left abdomen worrisome for enteritis including infectious, inflammatory and ischemic etiologies. 6. Large amount of stool throughout the colon compatible with constipation. Aortic Atherosclerosis (ICD10-I70.0). Electronically Signed   By: Tyron Gallon M.D.   On: 04/27/2024 20:18   DG Chest Port 1 View Result Date: 04/27/2024 CLINICAL DATA:  Epigastric pain. EXAM: PORTABLE CHEST 1 VIEW COMPARISON:  Chest radiograph dated 09/26/2020. FINDINGS: No focal consolidation, pleural effusion, or pneumothorax. The cardiac silhouette is within limits. Atherosclerotic calcification of the aorta. Osteopenia with degenerative changes of the spine. No acute osseous pathology. IMPRESSION: No active disease. Electronically Signed   By: Angus Bark M.D.   On: 04/27/2024 19:08    Medications: I have reviewed the patient's current medications.  Assessment: Epigastric pain, nausea, vomiting, hypotension on admission With a normal T. bili of 0.4 and no biliary obstruction noted on MRCP, presentation is not compatible with cholangitis LFTs normal :T. bili/AST/ALT/ALP of 0.04/19/12/82  MRCP showed possible 7 mm stone in cystic duct without obstruction, cholelithiasis  Red mucus/streaks noted in stool, hemoglobin on presentation was 12.6?   Hemoconcentrated, subsequent hemoglobin 11.4/9.7/8.5/7.5   Elevated troponin I 50/777/1799/2792/4890/5036/6145/3816  Other findings:  Proximal enteritis Groundglass opacities in bilateral lower lobes and lingula?  Multifocal pneumonia  Renal impairment: BUN 55/creatinine 2.29/GFR 23 Malnutrition, total protein 4.5/albumin  2.1 Improving leukocytosis: 21.4/14.8/13.5 Macrocytic anemia, heme globin 7.5, MCV 100.4  Comorbidities: Renal transplant x 3, pancreatic transplant x 1, diabetes, chronic kidney disease  Plan: Monitor H&H and transfuse to keep hemoglobin above 7. Mucus and red streaks in stool could be related to enteritis noted on CAT scan, okay to continue heparin  from GI standpoint unless patient has destabilizing GI bleed.  She is on broad-spectrum antibiotics, IV Zosyn , IV azithromycin , IV linezolid. She is now off pressors and hemodynamically stable.  No plans for ERCP based on normal LFTs and no obstruction noted on MRCP. Okay to start on regular diet from GI standpoint.   Genell Ken, MD 04/29/2024, 8:33 AM

## 2024-04-29 NOTE — Consult Note (Signed)
 Cardiology Consultation   Patient ID: [name] MRN: [id number]; DOB: 07/27/1961  Admit date: 04/27/2024 Date of Consult: 04/29/2024  PCP:  Authur Leghorn, MD   [facility] Providers Cardiologist:  New     Patient Profile:   [name] is a 63 y.o. female with a hx of enal transplant, DM1 on insuling pump, HTN, hypothyroidism, chronic anemia who is being seen 04/29/2024 for the evaluation of elevated troponin at the request of Dr Fulton Job.  History of Present Illness:   [name] 63 yo female history of renal transplant, DM1 on insuling pump, HTN, hypothyroidism, chronic anemia, presented with epgastric pain and vomiting. In ER initially hypotensive 67/46. Admitted with septic shock, possibly secondary to cholecystitis/cholangitis, being managed by primary team.    K 5.2 Cr 2.50 BUN 71 Lipase 43 WBC 6.5 Hgb 11.4 Plt 276 Mg 2.3 Lactic acid 4-->3.2-->4-->1.6  Procalc 8.97  Trop 7-->50-->356-->777-->1799-->2792-->4890-->5036-->6145-->3816 Coox panel: 83% EKG SR, inferior ST elevation CXR no acute process Echo: LVEF 65-70%, no WMAs, normal RV function.     CT A/P: possible multifocal pneumonia, cholelithiasis and mild ductal dilatation, small bowel enteritis MRI Abdomen: cholelithiasis without specific evidence of cholecystitis.            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   DDD (degenerative disc disease), lumbar    Diabetes mellitus without complication (HCC)    type 1   Diabetic ketoacidosis (HCC) 09/26/2020   Gait disorder    neurogenic   Gangrene of right foot (HCC)    Hypertension    Hypothyroid 12/08/2020   Neuropathy    Renal disease    Sacral decubitus ulcer, stage IV (HCC) 12/08/2020   Subacute osteomyelitis of right foot [facility])     Past Surgical History:  Procedure Laterality Date    AMPUTATION Right 10/01/2020   Procedure: RIGHT BELOW KNEE AMPUTATION;  Surgeon: Timothy Ford, MD;  Location: [facility] OR;  Service: Orthopedics;  Laterality: Right;   BLADDER SURGERY     ESOPHAGOGASTRODUODENOSCOPY (EGD) WITH PROPOFOL  N/A 11/10/2020   Procedure: ESOPHAGOGASTRODUODENOSCOPY (EGD) WITH PROPOFOL ;  Surgeon: Tobin Forts, MD;  Location: [facility] ENDOSCOPY;  Service: Endoscopy;  Laterality: N/A;   IR GASTROSTOMY TUBE REMOVAL  03/25/2021   KIDNEY TRANSPLANT     LEG SURGERY     tib-fib fx , ORIF       Inpatient Medications: Scheduled Meds:  aspirin   81 mg Oral Daily   Chlorhexidine  Gluconate Cloth  6 each Topical Daily   hydrocortisone  sod succinate (SOLU-CORTEF ) inj  50 mg Intravenous Q6H   insulin  aspart  0-9 Units Subcutaneous Q4H   insulin  aspart  1 Units Subcutaneous TID WC   insulin  glargine-yfgn  5 Units Subcutaneous Daily   levothyroxine   100 mcg Oral Q0600   pantoprazole  (PROTONIX ) IV  40 mg Intravenous Q24H   polyethylene glycol  17 g Oral Daily   senna  1 tablet Oral Daily   Continuous Infusions:  azithromycin  Stopped (04/28/24 2226)   heparin  400 Units/hr (04/29/24 0500)   linezolid (ZYVOX) IV Stopped (04/28/24 2216)   norepinephrine  (LEVOPHED ) Adult infusion Stopped (04/29/24 0350)   piperacillin -tazobactam (ZOSYN )  IV Stopped (04/29/24 0221)   PRN Meds: acetaminophen , docusate sodium , HYDROmorphone  (DILAUDID ) injection, HYDROmorphone  (DILAUDID ) injection, ipratropium-albuterol , ondansetron  (ZOFRAN ) IV, mouth rinse, polyethylene glycol  Allergies:   No Known Allergies  Social History:  Social History   Socioeconomic History   Marital status: Married    Spouse name: Not on file   Number of children: Not on file   Years of education: Not on file   Highest education level: Not on file  Occupational History   Not on file  Tobacco Use   Smoking status: Never   Smokeless tobacco: Never  Vaping Use   Vaping status: Never Used  Substance and Sexual Activity    Alcohol  use: Never   Drug use: Never   Sexual activity: Not Currently    Birth control/protection: None  Other Topics Concern   Not on file  Social History Narrative   Not on file   Social Drivers of Health   Financial Resource Strain: Low Risk  (04/27/2024)   Received from [facility]   Overall Financial Resource Strain (CARDIA)    Difficulty of Paying Living Expenses: Not very hard  Food Insecurity: No Food Insecurity (04/28/2024)   Hunger Vital Sign    Worried About Running Out of Food in the Last Year: Never true    Ran Out of Food in the Last Year: Never true  Transportation Needs: No Transportation Needs (04/28/2024)   PRAPARE - Administrator, Civil Service (Medical): No    Lack of Transportation (Non-Medical): No  Physical Activity: Unknown (04/27/2024)   Received from [facility]   Exercise Vital Sign    Days of Exercise per Week: 0 days    Minutes of Exercise per Session: Not on file  Stress: No Stress Concern Present (04/27/2024)   Received from [facility] - Occupational Stress Questionnaire    Feeling of Stress : Not at all  Social Connections: Moderately Integrated (04/27/2024)   Received from [facility]   Social Network    How would you rate your social network (family, work, friends)?: Adequate participation with social networks  Intimate Partner Violence: Not At Risk (04/28/2024)   Humiliation, Afraid, Rape, and Kick questionnaire    Fear of Current or Ex-Partner: No    Emotionally Abused: No    Physically Abused: No    Sexually Abused: No    Family History:    Family History  Problem Relation Age of Onset   Hypertension Mother    Hypertension Father      ROS:  Please see the history of present illness.   All other ROS reviewed and negative.     Physical Exam/Data:   Vitals:   04/29/24 0505 04/29/24 0520 04/29/24 0530 04/29/24 0535  BP:   (!) 119/57   Pulse: 76 74 73 77  Resp: 14 15 (!) 28  17  Temp:      TempSrc:      SpO2: 93% 91% 93% 90%  Weight:      Height:        Intake/Output Summary (Last 24 hours) at 04/29/2024 0741 Last data filed at 04/29/2024 3875 Gross per 24 hour  Intake 1718.61 ml  Output 950 ml  Net 768.61 ml      04/28/2024    4:24 AM 04/28/2024   12:16 AM 04/28/2024   12:00 AM  Last 3 Weights  Weight (lbs) 103 lb 6.3 oz 103 lb 6.3 oz 104 lb 4.4 oz  Weight (kg) 46.9 kg 46.9 kg 47.3 kg     Body mass index is 18.03 kg/m.  General:  Well nourished, well developed, in no acute distress HEENT: normal Neck: no JVD Vascular:  No carotid bruits; Distal pulses 2+ bilaterally Cardiac:  normal S1, S2; RRR; no murmur  Lungs:  clear to auscultation bilaterally, no wheezing, rhonchi or rales  Abd: soft, nontender, no hepatomegaly  Ext: no edema Musculoskeletal:  No deformities, BUE and BLE strength normal and equal Skin: warm and dry  Neuro:  CNs 2-12 intact, no focal abnormalities noted Psych:  Normal affect     Laboratory Data:  High Sensitivity Troponin:   Recent Labs  Lab 04/28/24 0944 04/28/24 1553 04/28/24 1800 04/28/24 2059 04/29/24 0509  TROPONINIHS 2,792* 4,890* 5,036* 6,145* 3,816*     Chemistry Recent Labs  Lab 04/27/24 1855 04/28/24 0505 04/28/24 1759 04/29/24 0509  NA  --  135 135 137  K  --  4.3 4.5 4.8  CL  --  107 104 109  CO2  --  18* 21* 22  GLUCOSE  --  352* 110* 233*  BUN  --  63* 62* 55*  CREATININE  --  2.48* 2.57* 2.29*  CALCIUM   --  8.6* 8.3* 7.6*  MG 2.3 1.6*  --   --   GFRNONAA  --  21* 20* 23*  ANIONGAP  --  10 10 6     Recent Labs  Lab 04/27/24 1850 04/28/24 0505 04/29/24 0509  PROT 6.7 5.1* 4.5*  ALBUMIN  3.2* 2.6* 2.1*  AST 25 29 24   ALT 15 16 13   ALKPHOS 187* 165* 82  BILITOT 0.7 0.5 0.4   Lipids No results for input(s): "CHOL", "TRIG", "HDL", "LABVLDL", "LDLCALC", "CHOLHDL" in the last 168 hours.  Hematology Recent Labs  Lab 04/28/24 0505 04/28/24 1313 04/29/24 0509  WBC 21.4* 14.8* 13.5*   RBC 3.00* 2.55* 2.33*  HGB 9.7* 8.5* 7.5*  HCT 31.7* 25.7* 23.4*  MCV 105.7* 100.8* 100.4*  MCH 32.3 33.3 32.2  MCHC 30.6 33.1 32.1  RDW 13.6 13.5 13.6  PLT 266 172 170   Thyroid  No results for input(s): "TSH", "FREET4" in the last 168 hours.  BNPNo results for input(s): "BNP", "PROBNP" in the last 168 hours.  DDimer No results for input(s): "DDIMER" in the last 168 hours.   Radiology/Studies:  MR ABDOMEN MRCP WO CONTRAST Result Date: 04/28/2024 CLINICAL DATA:  Elevated bilirubin level.  Cholelithiasis. EXAM: MRI ABDOMEN WITHOUT CONTRAST  (INCLUDING MRCP) TECHNIQUE: Multiplanar multisequence MR imaging of the abdomen was performed. Heavily T2-weighted images of the biliary and pancreatic ducts were obtained, and three-dimensional MRCP images were rendered by post processing. COMPARISON:  Ultrasound earlier in the day.  CT yesterday FINDINGS: Mild to moderate motion degradation throughout. The MRCP images are especially motion degraded. Lower chest: Cardiomegaly.  Tiny bilateral pleural effusions. Hepatobiliary: No dominant liver lesion. The gallbladder is borderline distended and multiple dependent stones are seen. No pericholecystic edema or wall thickening identified. A 7 mm stone in the porta hepatis including on 19/13 and 12/3 is difficult to localize. Favored to be within the cystic duct. Mild intrahepatic biliary duct dilatation. The common duct is mildly dilated in the porta hepatis including at 1.0 cm on 11/03 and 32/11. Tapers distally, without distal obstructive stone. Pancreas: Pancreatic atrophy. Borderline pancreatic duct dilatation, followed to the level of the ampulla. No dominant pancreatic mass or correlate for the questioned CT abnormality. Spleen:  Iron deposition within. Adrenals/Urinary Tract: Normal adrenal glands. Marked bilateral renal atrophy. Stomach/Bowel: Jejunal wall thickening and mesenteric edema again identified including on 21/10. Small hiatal hernia. Proximal  gastric underdistention. Scattered colonic diverticula. Vascular/Lymphatic: Normal caliber of the aorta and Nilda Keathley vessels. No  retroperitoneal or retrocrural adenopathy. Other: Small volume abdominal ascites including in the left upper quadrant. Musculoskeletal: S shaped thoracolumbar spine curvature. IMPRESSION: 1. Mild to moderate motion degradation. 2. Cholelithiasis without specific evidence of acute cholecystitis. 3. Stone of 7 mm in the porta hepatis is favored to be in the cystic duct, less likely the proximal common duct. Localization not possible secondary to motion. 4. Intra and extrahepatic biliary duct dilatation is mild, followed to the level of the ampulla. Given elevated bilirubin, consider ERCP to exclude occult ampullary stenosis or mass. 5. Proximal enteritis, as on CT 6. Trace bilateral pleural effusions and small volume abdominal ascites. Electronically Signed   By: Lore Rode M.D.   On: 04/28/2024 17:37   ECHOCARDIOGRAM COMPLETE Result Date: 04/28/2024    ECHOCARDIOGRAM REPORT   Patient Name:   [name] Date of Exam: 04/28/2024 Medical Rec #:  [id number]       Height:       63.5 in Accession #:    [id number]      Weight:       103.4 lb Date of Birth:  Dec 24, 1961      BSA:          1.469 m Patient Age:    62 years        BP:           140/63 mmHg Patient Gender: F               HR:           86 bpm. Exam Location:  Inpatient Procedure: 2D Echo, Cardiac Doppler and Color Doppler (Both Spectral and Color            Flow Doppler were utilized during procedure). Indications:    Shock  History:        Patient has prior history of Echocardiogram examinations, most                 recent 01/13/2024. Signs/Symptoms:Bacteremia; Risk                 Factors:Diabetes. Kidney and pancreas transplant per patient.  Sonographer:    Raynelle Callow RDCS Referring Phys: [id number] OMAR Macario Savin  Sonographer Comments: Technically difficult study due to poor echo windows, suboptimal parasternal window and  suboptimal apical window. GI consult started during apicals, patient angled back to discuss history with MD. IMPRESSIONS  1. Left ventricular ejection fraction, by estimation, is 65 to 70%. The left ventricle has normal function. The left ventricle has no regional wall motion abnormalities. There is mild asymmetric left ventricular hypertrophy of the basal and septal segments. Left ventricular diastolic parameters were normal.  2. Right ventricular systolic function is normal. The right ventricular size is normal. Tricuspid regurgitation signal is inadequate for assessing PA pressure.  3. The mitral valve is abnormal. Trivial mitral valve regurgitation. No evidence of mitral stenosis.  4. The aortic valve is tricuspid. There is mild calcification of the aortic valve. There is mild thickening of the aortic valve. Aortic valve regurgitation is mild. Aortic valve sclerosis is present, with no evidence of aortic valve stenosis.  5. The inferior vena cava is normal in size with greater than 50% respiratory variability, suggesting right atrial pressure of 3 mmHg. FINDINGS  Left Ventricle: Left ventricular ejection fraction, by estimation, is 65 to 70%. The left ventricle has normal function. The left ventricle has no regional wall motion abnormalities. Strain was performed and the global longitudinal strain is indeterminate. The left  ventricular internal cavity size was normal in size. There is mild asymmetric left ventricular hypertrophy of the basal and septal segments. Left ventricular diastolic parameters were normal. Right Ventricle: The right ventricular size is normal. No increase in right ventricular wall thickness. Right ventricular systolic function is normal. Tricuspid regurgitation signal is inadequate for assessing PA pressure. Left Atrium: Left atrial size was normal in size. Right Atrium: Right atrial size was normal in size. Pericardium: There is no evidence of pericardial effusion. Mitral Valve: The  mitral valve is abnormal. There is mild thickening of the mitral valve leaflet(s). There is mild calcification of the mitral valve leaflet(s). Mild mitral annular calcification. Trivial mitral valve regurgitation. No evidence of mitral valve stenosis. Tricuspid Valve: The tricuspid valve is normal in structure. Tricuspid valve regurgitation is not demonstrated. No evidence of tricuspid stenosis. Aortic Valve: The aortic valve is tricuspid. There is mild calcification of the aortic valve. There is mild thickening of the aortic valve. Aortic valve regurgitation is mild. Aortic regurgitation PHT measures 317 msec. Aortic valve sclerosis is present,  with no evidence of aortic valve stenosis. Aortic valve mean gradient measures 5.5 mmHg. Aortic valve peak gradient measures 9.0 mmHg. Aortic valve area, by VTI measures 2.15 cm. Pulmonic Valve: The pulmonic valve was normal in structure. Pulmonic valve regurgitation is trivial. No evidence of pulmonic stenosis. Aorta: The aortic root is normal in size and structure. Venous: The inferior vena cava is normal in size with greater than 50% respiratory variability, suggesting right atrial pressure of 3 mmHg. IAS/Shunts: No atrial level shunt detected by color flow Doppler. Additional Comments: 3D was performed not requiring image post processing on an independent workstation and was indeterminate.  LEFT VENTRICLE PLAX 2D LVIDd:         3.30 cm     Diastology LVIDs:         2.00 cm     LV e' medial:    4.24 cm/s LV PW:         1.10 cm     LV E/e' medial:  19.3 LV IVS:        1.00 cm     LV e' lateral:   6.53 cm/s LVOT diam:     2.00 cm     LV E/e' lateral: 12.5 LV SV:         65 LV SV Index:   44 LVOT Area:     3.14 cm  LV Volumes (MOD) LV vol d, MOD A2C: 63.3 ml LV vol d, MOD A4C: 59.0 ml LV vol s, MOD A2C: 13.9 ml LV vol s, MOD A4C: 15.5 ml LV SV MOD A2C:     49.4 ml LV SV MOD A4C:     59.0 ml LV SV MOD BP:      47.3 ml RIGHT VENTRICLE             IVC RV S prime:     20.50  cm/s  IVC diam: 1.30 cm TAPSE (M-mode): 1.7 cm LEFT ATRIUM             Index        RIGHT ATRIUM          Index LA diam:        3.00 cm 2.04 cm/m   RA Area:     5.29 cm LA Vol (A2C):   19.5 ml 13.27 ml/m  RA Volume:   7.96 ml  5.42 ml/m LA Vol (A4C):   19.8 ml 13.47 ml/m  LA Biplane Vol: 20.6 ml 14.02 ml/m  AORTIC VALVE                     PULMONIC VALVE AV Area (Vmax):    2.32 cm      PR End Diast Vel: 1.89 msec AV Area (Vmean):   2.21 cm AV Area (VTI):     2.15 cm AV Vmax:           150.00 cm/s AV Vmean:          107.000 cm/s AV VTI:            0.301 m AV Peak Grad:      9.0 mmHg AV Mean Grad:      5.5 mmHg LVOT Vmax:         111.00 cm/s LVOT Vmean:        75.400 cm/s LVOT VTI:          0.206 m LVOT/AV VTI ratio: 0.68 AI PHT:            317 msec  AORTA Ao Root diam: 3.10 cm Ao Asc diam:  3.40 cm MITRAL VALVE MV Area (PHT): 4.40 cm     SHUNTS MV Decel Time: 173 msec     Systemic VTI:  0.21 m MV E velocity: 81.85 cm/s   Systemic Diam: 2.00 cm MV A velocity: 110.70 cm/s MV E/A ratio:  0.74 Janelle Mediate MD Electronically signed by Janelle Mediate MD Signature Date/Time: 04/28/2024/9:21:11 AM    Final    US  Abdomen Limited RUQ (LIVER/GB) Result Date: 04/28/2024 CLINICAL DATA:  Hyperbilirubinemia EXAM: ULTRASOUND ABDOMEN LIMITED RIGHT UPPER QUADRANT COMPARISON:  CT from the previous day FINDINGS: Gallbladder: Physiologically distended. Wall thickness is normal, 2.7 mm. Cluster of calculi measuring up to 8 mm in the dependent aspect. Sonographer reports no sonographic Stiehl sign. Common bile duct: Diameter: 9 mm. 6 mm echogenic focus in the mid common bile duct, without caliber change of the duct. Liver: No focal lesion identified. Within normal limits in parenchymal echogenicity. Portal vein is patent on color Doppler imaging with normal direction of blood flow towards the liver. Other: Technologist describes technically difficult study secondary to lack of patient cooperation. IMPRESSION: 1. Cholelithiasis  without sonographic evidence of acute cholecystitis. 2. 6 mm echogenic focus in the mid common bile duct, without caliber change of the duct, possibly nonobstructive choledocholithiasis. Electronically Signed   By: Nicoletta Barrier M.D.   On: 04/28/2024 08:26   DG Chest Portable 1 View Result Date: 04/27/2024 CLINICAL DATA:  Central line placement EXAM: PORTABLE CHEST 1 VIEW COMPARISON:  Chest x-ray 04/27/2024 FINDINGS: Left-sided central venous catheter tip projects over the distal SVC. Multiple surgical clips overlie the left axilla, unchanged. Heart is mildly enlarged. The lungs are clear. There is no pleural effusion or pneumothorax. IMPRESSION: Left-sided central venous catheter tip projects over the distal SVC. No pneumothorax. Electronically Signed   By: Tyron Gallon M.D.   On: 04/27/2024 22:02   CT CHEST ABDOMEN PELVIS WO CONTRAST Result Date: 04/27/2024 CLINICAL DATA:  Sepsis EXAM: CT CHEST, ABDOMEN AND PELVIS WITHOUT CONTRAST TECHNIQUE: Multidetector CT imaging of the chest, abdomen and pelvis was performed following the standard protocol without IV contrast. RADIATION DOSE REDUCTION: This exam was performed according to the departmental dose-optimization program which includes automated exposure control, adjustment of the mA and/or kV according to patient size and/or use of iterative reconstruction technique. COMPARISON:  CT abdomen and pelvis 12/17/2021. FINDINGS: CT CHEST FINDINGS Cardiovascular: No  significant vascular findings. The heart is mildly enlarged. No pericardial effusion. There are atherosclerotic calcifications of the aorta and coronary arteries. Mediastinum/Nodes: There is a small air-fluid level throughout the esophagus. A small hiatal hernia is present. There is questionable distal esophageal wall thickening. No enlarged lymph nodes are identified. Visualized thyroid  gland is within normal limits. Lungs/Pleura: Patchy ground-glass and linear airspace opacities are seen in the bilateral  lower lobes. There also some ground-glass opacities in the lingula. The lungs are otherwise clear. There is no pleural effusion or pneumothorax. Musculoskeletal: There is trace compression deformity the superior endplate T4 T8 and T11. No acute fracture lines are present. CT ABDOMEN PELVIS FINDINGS Hepatobiliary: Gallstones are present. There is minimal pericholecystic fluid. There is mild intrahepatic biliary ductal dilatation. Common bile duct appears dilated. Questionable choledocholithiasis measuring 9 mm image 2/49. No focal liver lesions are seen. Pancreas: Atrophic, unchanged. Not well evaluated on this noncontrast study. Questionable rounded hypodense lesion in the head of the pancreas image 2/58 measuring 1 cm, mildly increased in size from prior. Spleen: Normal in size without focal abnormality. Adrenals/Urinary Tract: The adrenal glands are within normal limits. There is marked bilateral atrophy of the native kidneys, unchanged. There is a transplanted left lower quadrant kidney. There is minimal stranding surrounding the posterior aspect of the transplanted kidney, unchanged from prior. There is no hydronephrosis. There are cysts in the left kidney as seen on the prior study. There are few punctate calcifications in the left kidney which may be vascular, new from prior. The bladder is decompressed by Foley catheter. Stomach/Bowel: No dilated bowel loops are seen. There is a large amount of stool throughout the entire colon. The appendix is visualized and appears normal. There is some wall thickening and mesenteric edema involving small bowel loops in the left abdomen. The stomach is nondilated. Vascular/Lymphatic: There are severe atherosclerotic calcifications of the aorta and peripheral vessels. No evidence for aortic aneurysm. 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 There are multiple surgical clips along the pelvic sidewalls. No definitive  enlarged lymph nodes identified. Reproductive: Uterus not definitely seen. Other: There is presacral edema and trace free fluid in the pelvis. There is scarring in the anterior abdominal wall. Musculoskeletal: The bones are osteopenic. There is trace compression deformity of the superior endplate of L1 which is chronic and unchanged. Degenerative changes affect the spine. Right hip arthroplasty is present. IMPRESSION: 1. Patchy ground-glass and linear airspace opacities in the bilateral lower lobes and lingula worrisome for multifocal pneumonia. 2. Small hiatal hernia with air-fluid level in the esophagus. Questionable distal esophageal wall thickening. Correlate clinically for esophagitis. 3. Cholelithiasis with minimal pericholecystic fluid. Mild intrahepatic and extrahepatic biliary ductal dilatation with questionable choledocholithiasis. Correlate clinically for cholecystitis/biliary obstruction. Consider further evaluation with MRCP or ERCP. 4. Questionable rounded hypodense lesion in the head of the pancreas measuring 1 cm, mildly increased in size from prior. Recommend further evaluation with MRI/MRCP. 5. Wall thickening and mesenteric edema involving small bowel loops in the left abdomen worrisome for enteritis including infectious, inflammatory and ischemic etiologies. 6. Large amount of stool throughout the colon compatible with constipation. Aortic Atherosclerosis (ICD10-I70.0). Electronically Signed   By: Tyron Gallon M.D.   On: 04/27/2024 20:18   DG Chest Port 1 View Result Date: 04/27/2024 CLINICAL DATA:  Epigastric pain. EXAM: PORTABLE CHEST 1 VIEW COMPARISON:  Chest radiograph dated 09/26/2020. FINDINGS: No focal consolidation, pleural effusion, or pneumothorax. The cardiac silhouette is within limits. Atherosclerotic  calcification of the aorta. Osteopenia with degenerative changes of the spine. No acute osseous pathology. IMPRESSION: No active disease. Electronically Signed   By: Angus Bark  M.D.   On: 04/27/2024 19:08     Assessment and Plan:   1.Septic shock - management per primary team - peak lactic acid 4, down to 1.6. Procalc 8.9. Coox 83% with hyperdynamic LV function and normal RV function by echo, no evidence of cardiogenic component - previously on norepi now weaned off.  - questionable biliary source vs pneumonia.    2. NSTEMI - significant trop elevation in the setting of septic shock - on admission 5/2 she did have transient inferior ST elevation without reciprocal changes in setting of marked hypotension, these changes have resolved - peak tro 6145 trending down. Echo LVEF 65-70% without WMAs  - most likely main contributing factor was demand ischemia. AKI on CKD, rectal bleeding on heparin  with drop in Hgb, ongoing sepsis with possible biliary source with plans for MRCP would all be barriers to consider cath. No chest pain, trop trending down, normal echo.  - keep Hgb 8 or higher. Continue ASA and heparin  if able, if ongoing bleeding issues will have to d/c  3. AKI on CKD - likely secondary to sepsis and hypotension - management per primary team.    For questions or updates, please contact [facility] Please consult [email or web address] for contact info under    Signed, Armida Lander, MD  04/29/2024 7:41 AM

## 2024-04-29 NOTE — Progress Notes (Signed)
 Repeat CBC stable. Con't heparin  infusion.  Joesph Mussel, DO 04/29/24 5:03 PM [facility]

## 2024-04-29 NOTE — Progress Notes (Signed)
 PHARMACY - ANTICOAGULATION CONSULT NOTE  Pharmacy Consult for Heparin  Indication: chest pain/ACS  No Known Allergies  Patient Measurements: Height: 5' 3.5" (161.3 cm) Weight: 46.9 kg (103 lb 6.3 oz) IBW/kg (Calculated) : 53.55 HEPARIN  DW (KG): 47.3  Vital Signs: Temp: 98.5 F (36.9 C) (05/04 0806) Temp Source: Oral (05/04 0806) BP: 119/57 (05/04 0530) Pulse Rate: 77 (05/04 0535)  Labs: Recent Labs    04/28/24 0505 04/28/24 0812 04/28/24 1313 04/28/24 1553 04/28/24 1759 04/28/24 1800 04/28/24 2059 04/29/24 0509  HGB 9.7*  --  8.5*  --   --   --   --  7.5*  HCT 31.7*  --  25.7*  --   --   --   --  23.4*  PLT 266  --  172  --   --   --   --  170  HEPARINUNFRC  --   --   --   --  0.90*  --   --  0.56  CREATININE 2.48*  --   --   --  2.57*  --   --  2.29*  TROPONINIHS 777*   < >  --    < >  --  5,036* 6,145* 3,816*   < > = values in this interval not displayed.    Estimated Creatinine Clearance: 18.4 mL/min (A) (by C-G formula based on SCr of 2.29 mg/dL (H)).   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   DDD (degenerative disc disease), lumbar    Diabetes mellitus without complication (HCC)    type 1   Diabetic ketoacidosis (HCC) 09/26/2020   Gait disorder    neurogenic   Gangrene of right foot (HCC)    Hypertension    Hypothyroid 12/08/2020   Neuropathy    Renal disease    Sacral decubitus ulcer, stage IV (HCC) 12/08/2020   Subacute osteomyelitis of right foot (HCC)     Medications:  Infusions:   azithromycin  Stopped (04/28/24 2226)   heparin  400 Units/hr (04/29/24 0500)   linezolid (ZYVOX) IV Stopped (04/28/24 2216)   norepinephrine  (LEVOPHED ) Adult infusion Stopped (04/29/24 0350)   piperacillin -tazobactam (ZOSYN )  IV Stopped (04/29/24 0221)    Assessment: 35 yoF admitted on 5/2 with sepsis.  Pharmacy  is now consulted to dose Heparin  for ACS.   No prior to admission anticoagulation.  Heparin  SQ last dose on 5/3 at 5am.    Today, 04/29/2024: Heparin  level 0.4, therapeutic on heparin  400 units/hr CBC: Hgb continues to decrease to 7.5 (Admit Hgb 11.4, previous baseline ~8-05 Jan 2024), Plt 170 RN reported rectal bleeding x1 on 5/3, MD aware, no further bleeding today.  Continue to monitor BMs for bleeding, CBC.   Troponin: admit 7, increased to peak 6145, now decreasing 3816  Goal of Therapy:  Heparin  level 0.3-0.7 units/ml Monitor platelets by anticoagulation protocol: Yes   Plan:  Heparin  IV infusion at 400 units/hr Heparin  level 8 hours after starting Daily heparin  level and CBC   Kendall Pauls PharmD, BCPS [facility] main pharmacy [phone] 04/29/2024 12:44 PM

## 2024-04-30 DIAGNOSIS — R6521 Severe sepsis with septic shock: Secondary | ICD-10-CM | POA: Diagnosis not present

## 2024-04-30 DIAGNOSIS — A419 Sepsis, unspecified organism: Secondary | ICD-10-CM | POA: Diagnosis not present

## 2024-04-30 LAB — COMPREHENSIVE METABOLIC PANEL WITH GFR
ALT: 14 U/L (ref 0–44)
AST: 24 U/L (ref 15–41)
Albumin: 2.7 g/dL — ABNORMAL LOW (ref 3.5–5.0)
Alkaline Phosphatase: 77 U/L (ref 38–126)
Anion gap: 11 (ref 5–15)
BUN: 56 mg/dL — ABNORMAL HIGH (ref 8–23)
CO2: 23 mmol/L (ref 22–32)
Calcium: 8.4 mg/dL — ABNORMAL LOW (ref 8.9–10.3)
Chloride: 105 mmol/L (ref 98–111)
Creatinine, Ser: 1.87 mg/dL — ABNORMAL HIGH (ref 0.44–1.00)
GFR, Estimated: 30 mL/min — ABNORMAL LOW (ref >60)
Glucose, Bld: 121 mg/dL — ABNORMAL HIGH (ref 70–99)
Potassium: 3.3 mmol/L — ABNORMAL LOW (ref 3.5–5.1)
Sodium: 139 mmol/L (ref 135–145)
Total Bilirubin: 0.6 mg/dL (ref 0.0–1.2)
Total Protein: 5.9 g/dL — ABNORMAL LOW (ref 6.5–8.1)

## 2024-04-30 LAB — CBC
HCT: 24.1 % — ABNORMAL LOW (ref 36.0–46.0)
Hemoglobin: 7.6 g/dL — ABNORMAL LOW (ref 12.0–15.0)
MCH: 32.9 pg (ref 26.0–34.0)
MCHC: 31.5 g/dL (ref 30.0–36.0)
MCV: 104.3 fL — ABNORMAL HIGH (ref 80.0–100.0)
Platelets: 163 10*3/uL (ref 150–400)
RBC: 2.31 MIL/uL — ABNORMAL LOW (ref 3.87–5.11)
RDW: 13.6 % (ref 11.5–15.5)
WBC: 10.3 10*3/uL (ref 4.0–10.5)
nRBC: 0 % (ref 0.0–0.2)

## 2024-04-30 LAB — LIPID PANEL
Cholesterol: 174 mg/dL (ref 0–200)
HDL: 86 mg/dL (ref >40)
LDL Cholesterol: 70 mg/dL (ref 0–99)
Total CHOL/HDL Ratio: 2 ratio
Triglycerides: 88 mg/dL (ref <150)
VLDL: 18 mg/dL (ref 0–40)

## 2024-04-30 LAB — LEGIONELLA PNEUMOPHILA SEROGP 1 UR AG: L. pneumophila Serogp 1 Ur Ag: NEGATIVE

## 2024-04-30 LAB — GLUCOSE, CAPILLARY
Glucose-Capillary: 175 mg/dL — ABNORMAL HIGH (ref 70–99)
Glucose-Capillary: 117 mg/dL — ABNORMAL HIGH (ref 70–99)
Glucose-Capillary: 233 mg/dL — ABNORMAL HIGH (ref 70–99)
Glucose-Capillary: 315 mg/dL — ABNORMAL HIGH (ref 70–99)
Glucose-Capillary: 183 mg/dL — ABNORMAL HIGH (ref 70–99)

## 2024-04-30 LAB — HEPARIN LEVEL (UNFRACTIONATED)
Heparin Unfractionated: 0.24 [IU]/mL — ABNORMAL LOW (ref 0.30–0.70)
Heparin Unfractionated: 0.42 [IU]/mL (ref 0.30–0.70)

## 2024-04-30 LAB — MAGNESIUM: Magnesium: 2.4 mg/dL (ref 1.7–2.4)

## 2024-04-30 MED ORDER — INSULIN ASPART 100 UNIT/ML IJ SOLN
0.0000 [IU] | Freq: Three times a day (TID) | INTRAMUSCULAR | Status: DC
  Administered 2024-04-30: 8 [IU] via SUBCUTANEOUS
  Administered 2024-04-30: 5 [IU] via SUBCUTANEOUS
  Administered 2024-05-01: 1 [IU] via SUBCUTANEOUS
  Administered 2024-05-01: 5 [IU] via SUBCUTANEOUS
  Administered 2024-05-01 – 2024-05-02 (×2): 3 [IU] via SUBCUTANEOUS
  Administered 2024-05-02: 8 [IU] via SUBCUTANEOUS
  Administered 2024-05-02: 11 [IU] via SUBCUTANEOUS
  Administered 2024-05-03: 5 [IU] via SUBCUTANEOUS
  Administered 2024-05-03: 3 [IU] via SUBCUTANEOUS
  Administered 2024-05-03: 8 [IU] via SUBCUTANEOUS
  Administered 2024-05-04 (×2): 5 [IU] via SUBCUTANEOUS
  Administered 2024-05-04: 11 [IU] via SUBCUTANEOUS
  Administered 2024-05-05: 8 [IU] via SUBCUTANEOUS
  Administered 2024-05-05: 5 [IU] via SUBCUTANEOUS

## 2024-04-30 MED ORDER — LOPERAMIDE HCL 2 MG PO CAPS
2.0000 mg | ORAL_CAPSULE | Freq: Once | ORAL | Status: AC
  Administered 2024-04-30: 2 mg via ORAL
  Filled 2024-04-30: qty 1

## 2024-04-30 MED ORDER — HYDROCORTISONE SOD SUC (PF) 100 MG IJ SOLR
25.0000 mg | Freq: Two times a day (BID) | INTRAMUSCULAR | Status: DC
  Administered 2024-04-30 – 2024-05-01 (×2): 25 mg via INTRAVENOUS
  Filled 2024-04-30 (×2): qty 2

## 2024-04-30 MED ORDER — INSULIN ASPART 100 UNIT/ML IJ SOLN
0.0000 [IU] | Freq: Every day | INTRAMUSCULAR | Status: DC
  Administered 2024-05-01 – 2024-05-02 (×2): 2 [IU] via SUBCUTANEOUS
  Administered 2024-05-03 – 2024-05-04 (×2): 3 [IU] via SUBCUTANEOUS

## 2024-04-30 MED ORDER — PIPERACILLIN-TAZOBACTAM 3.375 G IVPB
3.3750 g | Freq: Three times a day (TID) | INTRAVENOUS | Status: AC
  Administered 2024-04-30 – 2024-05-03 (×11): 3.375 g via INTRAVENOUS
  Filled 2024-04-30 (×10): qty 50

## 2024-04-30 MED ORDER — AMLODIPINE BESYLATE 5 MG PO TABS: 5.0000 mg | ORAL_TABLET | Freq: Every day | ORAL | Status: DC

## 2024-04-30 MED ORDER — HYDRALAZINE HCL 25 MG PO TABS
25.0000 mg | ORAL_TABLET | Freq: Four times a day (QID) | ORAL | Status: DC | PRN
  Administered 2024-04-30 – 2024-05-01 (×2): 25 mg via ORAL
  Filled 2024-04-30 (×2): qty 1

## 2024-04-30 NOTE — Progress Notes (Incomplete)
 PHARMACY - ANTICOAGULATION CONSULT NOTE  Pharmacy Consult for Heparin  Indication: chest pain/ACS  No Known Allergies  Patient Measurements: Height: 5' 3.5" (161.3 cm) Weight: 48.1 kg (106 lb 0.7 oz) IBW/kg (Calculated) : 53.55 HEPARIN  DW (KG): 47.3  Vital Signs: Temp: 97.7 F (36.5 C) (05/05 1200) Temp Source: Oral (05/05 1200) BP: 181/75 (05/05 1217) Pulse Rate: 72 (05/05 1200)  Labs: Recent Labs    04/28/24 1759 04/28/24 1800 04/28/24 2059 04/29/24 0509 04/29/24 1140 04/29/24 1608 04/29/24 2004 04/30/24 0500 04/30/24 0828 04/30/24 0841  HGB  --   --   --  7.5*  --  7.3*  --   --   --  7.6*  HCT  --   --   --  23.4*  --  22.5*  --   --   --  24.1*  PLT  --   --   --  170  --  155  --   --   --  163  HEPARINUNFRC 0.90*  --   --  0.56 0.40  --  0.29* 0.24*  --   --   CREATININE 2.57*  --   --  2.29*  --   --   --   --  1.87*  --   TROPONINIHS  --  5,036* 6,145* 3,816*  --   --   --   --   --   --     Estimated Creatinine Clearance: 23.1 mL/min (A) (by C-G formula based on SCr of 1.87 mg/dL (H)).   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   DDD (degenerative disc disease), lumbar    Diabetes mellitus without complication (HCC)    type 1   Diabetic ketoacidosis (HCC) 09/26/2020   Gait disorder    neurogenic   Gangrene of right foot (HCC)    Hypertension    Hypothyroid 12/08/2020   Neuropathy    Renal disease    Sacral decubitus ulcer, stage IV (HCC) 12/08/2020   Subacute osteomyelitis of right foot (HCC)     Medications:  Infusions:   piperacillin -tazobactam (ZOSYN )  IV 3.375 g (04/30/24 1208)    Assessment: 20 yoF admitted on 5/2 with sepsis.  Pharmacy is now consulted to dose Heparin  for ACS.   No prior to admission anticoagulation.  Heparin  SQ last dose on 5/3 at 5am.    Today, 04/30/2024: HL pending  ***  on heparin  550 units/hr CBC:  Hgb remains low/stable at 7.6, Plt remains WNL No bleeding or interruptions per RN.  Brown BM documented in flowsheet.     Goal of Therapy:  Heparin  level 0.3-0.7 units/ml Monitor platelets by anticoagulation protocol: Yes   Plan:  Increase heparin  infusion to 550 units/hr Heparin  level 8 hours after increasing rate Daily heparin  level and CBC   Kendall Pauls PharmD, BCPS [facility] main pharmacy [phone] 04/30/2024 2:21 PM

## 2024-04-30 NOTE — Progress Notes (Signed)

## 2024-04-30 NOTE — Progress Notes (Signed)
 PHARMACY NOTE:  ANTIMICROBIAL RENAL DOSAGE ADJUSTMENT  Current antimicrobial regimen includes a mismatch between antimicrobial dosage and estimated renal function.  As per policy approved by the Pharmacy & Therapeutics and Medical Executive Committees, the antimicrobial dosage will be adjusted accordingly.  Current antimicrobial dosage:  Zosyn  3.375g IV Q12H infused over 4hrs.   Indication:  Shock, possible CAP, possible enteritis  Renal Function: Estimated Creatinine Clearance: 23.1 mL/min (A) (by C-G formula based on SCr of 1.87 mg/dL (H)).    Antimicrobial dosage has been changed to:  Zosyn  3.375g IV Q8H infused over 4hrs.    Thank you for allowing pharmacy to be a part of this patient's care.  Kendall Pauls PharmD, BCPS [facility] main pharmacy [phone] 04/30/2024 11:50 AM

## 2024-04-30 NOTE — TOC Initial Note (Signed)
 Transition of Care [facility]) - Initial/Assessment Note    Patient Details  Name: [name] MRN: [id number] Date of Birth: [birth year]  [facility] CM/SW Contact:    Amaryllis Junior, LCSW Phone Number: 04/30/2024, 10:51 AM  Clinical Narrative:                 Pt from home with spouse. Pt continues medical workup. PT/OT eval pending. TOC following for TOC needs.     Barriers to Discharge: Continued Medical Work up   Patient Goals and CMS Choice Patient states their goals for this hospitalization and ongoing recovery are:: return home CMS Medicare.gov Compare Post Acute Care list provided to::  (NA) Choice offered to / list presented to : NA [facility] ownership interest in [facility].provided to::  (NA)    Expected Discharge Plan and Services In-house Referral: NA     Living arrangements for the past 2 months: Single Family Home                 DME Arranged: N/A DME Agency: NA       HH Arranged: NA HH Agency: NA        Prior Living Arrangements/Services Living arrangements for the past 2 months: Single Family Home Lives with:: Spouse Patient language and need for interpreter reviewed:: Yes Do you feel safe going back to the place where you live?: Yes      Need for Family Participation in Patient Care: Yes (Comment) Care giver support system in place?: Yes (comment)   Criminal Activity/Legal Involvement Pertinent to Current Situation/Hospitalization: No - Comment as needed  Activities of Daily Living   ADL Screening (condition at time of admission) Independently performs ADLs?: Yes (appropriate for developmental age) Is the patient deaf or have difficulty hearing?: Yes Does the patient have difficulty seeing, even when wearing glasses/contacts?: Yes (left at home) Does the patient have difficulty concentrating, remembering, or making decisions?: No  Permission Sought/Granted                  Emotional Assessment Appearance::  Appears stated age Attitude/Demeanor/Rapport: Engaged Affect (typically observed): Accepting Orientation: : Oriented to  Time, Oriented to Situation, Oriented to Place, Oriented to Self Alcohol  / Substance Use: Not Applicable Psych Involvement: No (comment)  Admission diagnosis:  Choledocholithiasis [K80.50] Hyperkalemia [E87.5] Septic shock (HCC) [A41.9, R65.21] Severe comorbid illness [R69] Hypotension, unspecified hypotension type [I95.9] Patient Active Problem List   Diagnosis Date Noted   Septic shock (HCC) 04/27/2024   Gram-positive bacteremia 01/12/2024   Peripheral neuropathy 01/12/2024   Sepsis secondary to UTI (HCC) 01/09/2024   Pressure ulcer 01/09/2024   Acute cystitis without hematuria    Acute pyelonephritis    AKI (acute kidney injury) (HCC) 12/18/2021   Chronic indwelling Foley catheter 12/17/2021   S/P BKA (below knee amputation) unilateral, right (HCC) 12/17/2021   DNR (do not resuscitate)/DNI(Do Not Intubate) 12/17/2021   Chronic osteomyelitis of sacrum (HCC) 08/25/2021   Anemia 02/25/2021   Goals of care, counseling/discussion    Palliative care by specialist    Malnutrition of moderate degree 01/07/2021   Failure to thrive in adult    Unspecified severe protein-calorie malnutrition (HCC) 12/10/2020   Sacral decubitus ulcer, stage IV (HCC) 12/08/2020   Erosive esophagitis 12/08/2020   Hypothyroidism 12/08/2020   Depression 12/08/2020   Insulin  dependent type 1 diabetes mellitus (HCC) 12/08/2020   Hypoalbuminemia 12/07/2020   Coffee ground emesis    Gastroesophageal reflux disease with esophagitis and hemorrhage  Long-term use of immunosuppressant medication    Type 1 diabetes mellitus with complication, with long term current use of insulin  pump (HCC) 09/27/2020   Pressure ulcer of BKA stump (HCC) 09/27/2020   Anemia secondary to renal failure 05/14/2020   History of renal transplant 05/14/2020   CKD stage 3b, GFR 30-44 ml/min (HCC) 05/14/2020    Hypertension 05/14/2020   PCP:  Authur Leghorn, MD Pharmacy:   [id number] [place], [place] - [address] [place] [address] Phone: [phone] Fax: [phone]  Melode[facility] - [facility] [address] [place] [address] Phone: [phone] Fax: [phone]     Social Drivers of Health (SDOH) Social History: SDOH Screenings   Food Insecurity: No Food Insecurity (04/28/2024)  Housing: Low Risk  (04/28/2024)  Transportation Needs: No Transportation Needs (04/28/2024)  Utilities: Not At Risk (04/28/2024)  Depression (PHQ2-9): Low Risk  (08/25/2021)  Financial Resource Strain: Low Risk  (04/27/2024)   Received from [facility]  Physical Activity: Unknown (04/27/2024)   Received from [facility]  Social Connections: Moderately Integrated (04/27/2024)   Received from [facility]  Stress: No Stress Concern Present (04/27/2024)   Received from [facility]  Tobacco Use: Low Risk  (04/27/2024)   Received from [facility]   SDOH Interventions:     Readmission Risk Interventions    04/30/2024   10:48 AM 01/16/2024   12:39 PM 12/21/2021    1:18 PM  Readmission Risk Prevention Plan  Transportation Screening Complete Complete Complete  PCP or Specialist Appt within 3-5 Days  Complete   HRI or Home Care Consult  Complete Complete  Social Work Consult for Recovery Care Planning/Counseling  Complete Complete  Palliative Care Screening  Not Applicable Not Applicable  Medication Review Oceanographer) Complete Complete Complete  PCP or Specialist appointment within 3-5 days of discharge Complete    HRI or Home Care Consult Complete    SW Recovery Care/Counseling Consult Complete    Palliative Care Screening Not Applicable    Skilled Nursing Facility Not Applicable

## 2024-04-30 NOTE — Progress Notes (Addendum)
 Rounding Note    Patient Name: [name] Date of Encounter: 04/30/2024  [facility] Cardiologist: None   Subjective   Denies any CP or SOB.   Inpatient Medications    Scheduled Meds:  aspirin   81 mg Oral Daily   atorvastatin   40 mg Oral Daily   Chlorhexidine  Gluconate Cloth  6 each Topical Daily   hydrocortisone  sod succinate (SOLU-CORTEF ) inj  50 mg Intravenous Q12H   insulin  aspart  0-9 Units Subcutaneous Q4H   insulin  aspart  1 Units Subcutaneous TID WC   insulin  glargine-yfgn  5 Units Subcutaneous Daily   levothyroxine   100 mcg Oral Q0600   pantoprazole  (PROTONIX ) IV  40 mg Intravenous Q24H   polyethylene glycol  17 g Oral Daily   senna  1 tablet Oral Daily   Continuous Infusions:  heparin  550 Units/hr (04/30/24 0800)   linezolid (ZYVOX) IV Stopped (04/29/24 2207)   piperacillin -tazobactam (ZOSYN )  IV Stopped (04/30/24 0106)   PRN Meds: acetaminophen , docusate sodium , HYDROmorphone  (DILAUDID ) injection, HYDROmorphone  (DILAUDID ) injection, ipratropium-albuterol , ondansetron  (ZOFRAN ) IV, mouth rinse, polyethylene glycol   Vital Signs    Vitals:   04/30/24 0400 04/30/24 0500 04/30/24 0544 04/30/24 0751  BP: (!) 162/65     Pulse: 63     Resp: 16     Temp:  98.1 F (36.7 C)  97.8 F (36.6 C)  TempSrc:  Oral  Oral  SpO2: 91%     Weight:   48.1 kg   Height:        Intake/Output Summary (Last 24 hours) at 04/30/2024 0917 Last data filed at 04/30/2024 0800 Gross per 24 hour  Intake 1757.18 ml  Output 3075 ml  Net -1317.82 ml      04/30/2024    5:44 AM 04/29/2024    7:05 AM 04/28/2024    4:24 AM  Last 3 Weights  Weight (lbs) 106 lb 0.7 oz 103 lb 9.9 oz 103 lb 6.3 oz  Weight (kg) 48.1 kg 47 kg 46.9 kg      Telemetry    NSR, no significant ventricular ectopy - Personally Reviewed  ECG    NSR with low voltage - Personally Reviewed  Physical Exam   GEN: No acute distress.   Neck: No JVD Cardiac: RRR, no murmurs, rubs, or gallops.   Respiratory: Clear to auscultation bilaterally. GI: Soft, nontender, non-distended  MS: No edema; No deformity. Neuro:  Nonfocal  Psych: Normal affect   Labs    High Sensitivity Troponin:   Recent Labs  Lab 04/28/24 0944 04/28/24 1553 04/28/24 1800 04/28/24 2059 04/29/24 0509  TROPONINIHS 2,792* 4,890* 5,036* 6,145* 3,816*     Chemistry Recent Labs  Lab 04/27/24 1855 04/28/24 0505 04/28/24 1759 04/29/24 0509 04/30/24 0828  NA  --  135 135 137 139  K  --  4.3 4.5 4.8 3.3*  CL  --  107 104 109 105  CO2  --  18* 21* 22 23  GLUCOSE  --  352* 110* 233* 121*  BUN  --  63* 62* 55* 56*  CREATININE  --  2.48* 2.57* 2.29* 1.87*  CALCIUM   --  8.6* 8.3* 7.6* 8.4*  MG 2.3 1.6*  --   --  2.4  PROT  --  5.1*  --  4.5* 5.9*  ALBUMIN   --  2.6*  --  2.1* 2.7*  AST  --  29  --  24 24  ALT  --  16  --  13 14  ALKPHOS  --  165*  --  82 77  BILITOT  --  0.5  --  0.4 0.6  GFRNONAA  --  21* 20* 23* 30*  ANIONGAP  --  10 10 6 11     Lipids No results for input(s): "CHOL", "TRIG", "HDL", "LABVLDL", "LDLCALC", "CHOLHDL" in the last 168 hours.  Hematology Recent Labs  Lab 04/29/24 0509 04/29/24 1608 04/30/24 0841  WBC 13.5* 11.2* 10.3  RBC 2.33* 2.22* 2.31*  HGB 7.5* 7.3* 7.6*  HCT 23.4* 22.5* 24.1*  MCV 100.4* 101.4* 104.3*  MCH 32.2 32.9 32.9  MCHC 32.1 32.4 31.5  RDW 13.6 13.7 13.6  PLT 170 155 163   Thyroid  No results for input(s): "TSH", "FREET4" in the last 168 hours.  BNPNo results for input(s): "BNP", "PROBNP" in the last 168 hours.  DDimer No results for input(s): "DDIMER" in the last 168 hours.   Radiology    MR ABDOMEN MRCP WO CONTRAST Result Date: 04/28/2024 CLINICAL DATA:  Elevated bilirubin level.  Cholelithiasis. EXAM: MRI ABDOMEN WITHOUT CONTRAST  (INCLUDING MRCP) TECHNIQUE: Multiplanar multisequence MR imaging of the abdomen was performed. Heavily T2-weighted images of the biliary and pancreatic ducts were obtained, and three-dimensional MRCP images were  rendered by post processing. COMPARISON:  Ultrasound earlier in the day.  CT yesterday FINDINGS: Mild to moderate motion degradation throughout. The MRCP images are especially motion degraded. Lower chest: Cardiomegaly.  Tiny bilateral pleural effusions. Hepatobiliary: No dominant liver lesion. The gallbladder is borderline distended and multiple dependent stones are seen. No pericholecystic edema or wall thickening identified. A 7 mm stone in the porta hepatis including on 19/13 and 12/3 is difficult to localize. Favored to be within the cystic duct. Mild intrahepatic biliary duct dilatation. The common duct is mildly dilated in the porta hepatis including at 1.0 cm on 11/03 and 32/11. Tapers distally, without distal obstructive stone. Pancreas: Pancreatic atrophy. Borderline pancreatic duct dilatation, followed to the level of the ampulla. No dominant pancreatic mass or correlate for the questioned CT abnormality. Spleen:  Iron deposition within. Adrenals/Urinary Tract: Normal adrenal glands. Marked bilateral renal atrophy. Stomach/Bowel: Jejunal wall thickening and mesenteric edema again identified including on 21/10. Small hiatal hernia. Proximal gastric underdistention. Scattered colonic diverticula. Vascular/Lymphatic: Normal caliber of the aorta and branch vessels. No retroperitoneal or retrocrural adenopathy. Other: Small volume abdominal ascites including in the left upper quadrant. Musculoskeletal: S shaped thoracolumbar spine curvature. IMPRESSION: 1. Mild to moderate motion degradation. 2. Cholelithiasis without specific evidence of acute cholecystitis. 3. Stone of 7 mm in the porta hepatis is favored to be in the cystic duct, less likely the proximal common duct. Localization not possible secondary to motion. 4. Intra and extrahepatic biliary duct dilatation is mild, followed to the level of the ampulla. Given elevated bilirubin, consider ERCP to exclude occult ampullary stenosis or mass. 5. Proximal  enteritis, as on CT 6. Trace bilateral pleural effusions and small volume abdominal ascites. Electronically Signed   By: Lore Rode M.D.   On: 04/28/2024 17:37    Cardiac Studies   Echo 04/28/2024  1. Left ventricular ejection fraction, by estimation, is 65 to 70%. The  left ventricle has normal function. The left ventricle has no regional  wall motion abnormalities. There is mild asymmetric left ventricular  hypertrophy of the basal and septal  segments. Left ventricular diastolic parameters were normal.   2. Right ventricular systolic function is normal. The right ventricular  size is normal. Tricuspid regurgitation signal is inadequate for assessing  PA pressure.   3.  The mitral valve is abnormal. Trivial mitral valve regurgitation. No  evidence of mitral stenosis.   4. The aortic valve is tricuspid. There is mild calcification of the  aortic valve. There is mild thickening of the aortic valve. Aortic valve  regurgitation is mild. Aortic valve sclerosis is present, with no evidence  of aortic valve stenosis.   5. The inferior vena cava is normal in size with greater than 50%  respiratory variability, suggesting right atrial pressure of 3 mmHg.   Patient Profile     63 y.o. female with PMH of renal transplant, DM I on insulin  pump, HTN, hypothyroidism, and chronic anemia who was admitted with septic shock possibly 2/2 multifocal PNA vs cholecystitis/cholangitis and found to have elevated troponin  Assessment & Plan    Elevated troponin  - Trop 7-->50-->356-->777-->1799-->2792-->4890-->5036-->6145-->3816   - denies any chest pain. Echo 04/28/2024 no RWMA, EF 65-70%. L internal jugular central line. Coox 72% yesterday.  - likely demand ischemia. Although initial EKG showed minimal ST elevation in the inferior leads, without contralateral ST depression. Subsequent EKG showed resolution. No further cardiac work up planned. Avoid severe anemia  Septic shock with elevated lactic  acidosis  - Lactic acid 4-->3.2-->4-->1.6   - per primary team, off of pressor. Still on IV abx.  - seen by GI, suspicion for cholangitis low, no plan for ERCP in light of normal LFT and no obstruction on MRCP  Hypertension: off of pressor. Home lisinopril  on hold  DM I on insulin  pump  Anemia: hgb remain in the 7-8 range. Transfuse as needed.   H/o renal transplant: Cr 2.7 on arrival, baseline 1.7. Cr came down to 1.87 this morning.       For questions or updates, please contact [facility] Please consult [email or web address] for contact info under        Signed, Ervin Heath, PA  04/30/2024, 9:17 AM    Patient seen and examined   I agree with findings as noted above by H Meng Pt feels great today Lungs are CTA Cardiac RRR  No S3  No murmurs Ext are without edema  NSTEMI  Most likely demand I setting of severe systemic illness along with T1DM PT is without CP   LVEF normal Stop heparin    HTN  BP is now elevated   I would add amlodipine to regimen      LIpids   WIll set up for panel   No rx for now until medically improves, liver function normalizes

## 2024-04-30 NOTE — Plan of Care (Signed)

## 2024-04-30 NOTE — Evaluation (Addendum)
 Physical Therapy Evaluation Patient Details Name: [name] MRN: [id number] DOB: 02-10-1961 Today's Date: 04/30/2024  History of Present Illness  63 yr old female admitted with nausea, vomiting, hypotension. Dx of septic shock possibly due to cholecystitis, AKI, acute metabolic encephalopathy. 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  Clinical Impression  Pt admitted with above diagnosis. Mod assist for bed mobility. Pt found to be heavily soiled with BM. Attempted sit to stand with max assist of 2 with RW, pt unable to maintain standing 2* buckling of LLE. Pt reports she's had L heel reconstruction and requires a special shoe for standing, the shoe was not in the room. Max assist for lateral scoot from bed to recliner. BP 202/78 after several minutes seated in recliner, RN notified. Pt reports she has assistance for transfers from her husband, uses WC for mobility, and could perform sit to stand at baseline, but could not ambulate. She would benefit from post-acute rehab as she presents with decreased strength and activity tolerance and requires increased assistance for mobility compared to her baseline.  Pt currently with functional limitations due to the deficits listed below (see PT Problem List). Pt will benefit from acute skilled PT to increase their independence and safety with mobility to allow discharge.           If plan is discharge home, recommend the following: A lot of help with bathing/dressing/bathroom;Assistance with cooking/housework;Assist for transportation;Help with stairs or ramp for entrance;Two people to help with walking and/or transfers   Can travel by private vehicle        Equipment Recommendations None recommended by PT  Recommendations for Other Services       Functional Status Assessment Patient has had a recent  decline in their functional status and demonstrates the ability to make significant improvements in function in a reasonable and predictable amount of time.     Precautions / Restrictions Precautions Precautions: Fall Recall of Precautions/Restrictions: Impaired Precaution/Restrictions Comments: pt denies falls in past 6 months Restrictions Weight Bearing Restrictions Per Provider Order: No      Mobility  Bed Mobility Overal bed mobility: Needs Assistance Bed Mobility: Supine to Sit     Supine to sit: Mod assist     General bed mobility comments: assist to raise trunk and pivot hips to EOB    Transfers Overall transfer level: Needs assistance Equipment used: Rolling walker (2 wheels) Transfers: Sit to/from Stand, Bed to chair/wheelchair/BSC Sit to Stand: Max assist          Lateral/Scoot Transfers: Max assist General transfer comment: Max A to power up, pt unable to come to full upright standing with RW with max A of 2, LLE buckled, reports she needs a special shoe for LLE (not in room). Max A for lateral scoot to recliner with arm dropped.    Ambulation/Gait                  Stairs            Wheelchair Mobility     Tilt Bed    Modified Rankin (Stroke Patients Only)       Balance Overall balance assessment: Needs assistance Sitting-balance support: Feet supported, No upper extremity supported Sitting balance-Leahy Scale: Fair     Standing balance support: Bilateral upper extremity supported Standing balance-Leahy Scale: Zero  Pertinent Vitals/Pain Pain Assessment Pain Assessment: No/denies pain    Home Living Family/patient expects to be discharged to:: Private residence Living Arrangements: Spouse/significant other Available Help at Discharge: Family;Available PRN/intermittently (husband wroks nights) Type of Home: House Home Access: Level entry       Home Layout: One level Home  Equipment: Wheelchair - Biomedical scientist (2 wheels);Rollator (4 wheels);Cane - single point      Prior Function Prior Level of Function : Needs assist       Physical Assist : Mobility (physical);ADLs (physical) Mobility (physical): Transfers   Mobility Comments: husband assists with transfers, self propels WC, reports she was working on sit to stand, hasn't walked in a long time ADLs Comments: usually sponge baths; husband assists; husband helps with transfers; "lifts her in"     Extremity/Trunk Assessment   Upper Extremity Assessment Upper Extremity Assessment: Defer to OT evaluation    Lower Extremity Assessment Lower Extremity Assessment: LLE deficits/detail LLE Deficits / Details: pt reports reconstruction of L heel and that she has a special she she wears,  unable to perform SLR, L foot numb to light touch, LLE buckled in standing; hip/knee grossly 2/5; ankle -3/5 LLE Sensation: decreased light touch;history of peripheral neuropathy LLE Coordination: decreased gross motor;decreased fine motor    Cervical / Trunk Assessment Cervical / Trunk Assessment: Kyphotic  Communication   Communication Communication: Impaired Factors Affecting Communication: Hearing impaired    Cognition Arousal: Alert Behavior During Therapy: WFL for tasks assessed/performed   PT - Cognitive impairments: No family/caregiver present to determine baseline                       PT - Cognition Comments: oriented x 4, some confusion about recent events Following commands: Intact       Cueing       General Comments General comments (skin integrity, edema, etc.): BP 202/78 after transfer, RN notified    Exercises     Assessment/Plan    PT Assessment Patient needs continued PT services  PT Problem List Decreased activity tolerance;Decreased mobility;Decreased strength;Decreased balance       PT Treatment Interventions DME instruction;Functional mobility  training;Balance training;Therapeutic activities;Patient/family education;Wheelchair mobility training;Therapeutic exercise    PT Goals (Current goals can be found in the Care Plan section)  Acute Rehab PT Goals Patient Stated Goal: likes to make greeting cards, wants to get stronger PT Goal Formulation: With patient Time For Goal Achievement: 05/14/24 Potential to Achieve Goals: Good    Frequency Min 3X/week     Co-evaluation               AM-PAC PT "6 Clicks" Mobility  Outcome Measure Help needed turning from your back to your side while in a flat bed without using bedrails?: A Lot Help needed moving from lying on your back to sitting on the side of a flat bed without using bedrails?: Total Help needed moving to and from a bed to a chair (including a wheelchair)?: Total Help needed standing up from a chair using your arms (e.g., wheelchair or bedside chair)?: Total Help needed to walk in hospital room?: Total Help needed climbing 3-5 steps with a railing? : Total 6 Click Score: 7    End of Session Equipment Utilized During Treatment: Gait belt Activity Tolerance: Patient tolerated treatment well Patient left: in chair;with chair alarm set;with call bell/phone within reach;with nursing/sitter in room Nurse Communication: Mobility status;Other (comment) (BP 202/78) PT Visit Diagnosis: Muscle weakness (generalized) (M62.81);Other abnormalities  of gait and mobility (R26.89);Unsteadiness on feet (R26.81)    Time: 9629-5284 PT Time Calculation (min) (ACUTE ONLY): 45 min   Charges:   PT Evaluation $PT Eval Moderate Complexity: 1 Mod PT Treatments $Therapeutic Activity: 8-22 mins PT General Charges $$ ACUTE PT VISIT: 1 Visit         Daymon Evans PT 04/30/2024  Acute Rehabilitation Services  Office [phone]

## 2024-04-30 NOTE — Progress Notes (Addendum)
 PHARMACY - ANTICOAGULATION CONSULT NOTE  Pharmacy Consult for Heparin  Indication: chest pain/ACS  No Known Allergies  Patient Measurements: Height: 5' 3.5" (161.3 cm) Weight: 48.1 kg (106 lb 0.7 oz) IBW/kg (Calculated) : 53.55 HEPARIN  DW (KG): 47.3  Vital Signs: Temp: 98.1 F (36.7 C) (05/05 0500) Temp Source: Oral (05/05 0500) BP: 162/65 (05/05 0400) Pulse Rate: 63 (05/05 0400)  Labs: Recent Labs    04/28/24 0505 04/28/24 0812 04/28/24 1313 04/28/24 1553 04/28/24 1759 04/28/24 1800 04/28/24 2059 04/29/24 0509 04/29/24 1140 04/29/24 1608 04/29/24 2004 04/30/24 0500  HGB 9.7*  --  8.5*  --   --   --   --  7.5*  --  7.3*  --   --   HCT 31.7*  --  25.7*  --   --   --   --  23.4*  --  22.5*  --   --   PLT 266  --  172  --   --   --   --  170  --  155  --   --   HEPARINUNFRC  --   --   --    < > 0.90*  --   --  0.56 0.40  --  0.29* 0.24*  CREATININE 2.48*  --   --   --  2.57*  --   --  2.29*  --   --   --   --   TROPONINIHS 777*   < >  --    < >  --  5,036* 6,145* 3,816*  --   --   --   --    < > = values in this interval not displayed.    Estimated Creatinine Clearance: 18.8 mL/min (A) (by C-G formula based on SCr of 2.29 mg/dL (H)).   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   DDD (degenerative disc disease), lumbar    Diabetes mellitus without complication (HCC)    type 1   Diabetic ketoacidosis (HCC) 09/26/2020   Gait disorder    neurogenic   Gangrene of right foot (HCC)    Hypertension    Hypothyroid 12/08/2020   Neuropathy    Renal disease    Sacral decubitus ulcer, stage IV (HCC) 12/08/2020   Subacute osteomyelitis of right foot (HCC)     Medications:  Infusions:   heparin  450 Units/hr (04/30/24 0115)   linezolid (ZYVOX) IV Stopped (04/29/24 2207)   piperacillin -tazobactam (ZOSYN )  IV Stopped  (04/30/24 0106)    Assessment: 89 yoF admitted on 5/2 with sepsis.  Pharmacy is now consulted to dose Heparin  for ACS.   No prior to admission anticoagulation.  Heparin  SQ last dose on 5/3 at 5am.    Today, 04/30/2024: HL 0.24 subtherapeutic on 450 units/hr No bleeding or interruptions per RN   Goal of Therapy:  Heparin  level 0.3-0.7 units/ml Monitor platelets by anticoagulation protocol: Yes   Plan:  Increase heparin  infusion to 550 units/hr Heparin  level 8 hours after increasing rate Daily heparin  level and CBC   Beau Bound RPh 04/30/2024, 5:48 AM

## 2024-04-30 NOTE — Progress Notes (Signed)
 Enzo Has 3:46 PM  Subjective: Patient seen and examined and discussed with my partner Dr. Feliberto Hopping and her hospital computer chart reviewed and she is doing much better is not having any abdominal pain is eating better and we discussed her history and she has no new complaints  Objective: Vital signs stable afebrile no acute distress looks well not examined today LFTs normal hemoglobin stable white count normal  Assessment: Improved  Plan: Might need surgical consultation for cholecystectomy at some point and I will be available this week if needed from a GI standpoint otherwise can follow-up with Dr. Feliberto Hopping as an outpatient if needed  [facility]  office [phone] After 5PM or if no answer call [phone]

## 2024-04-30 NOTE — Evaluation (Signed)
 Occupational Therapy Evaluation Patient Details Name: [name] MRN: [id number] DOB: [birth year] Today's Date: 04/30/2024   History of Present Illness   63 yr old female admitted with nausea, vomiting, hypotension. Dx of septic shock possibly due to cholecystitis, AKI, acute metabolic encephalopathy. 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     Clinical Impressions PTA pt living at home with her husband who assists with ADL and mobility as needed. Husband works nights and was not available to get clarification regarding PLOF. Pt primarily uses a wc to self propel and stand using her "tenni" shoe however shoe not available during assessment which appeared to limit mobility significantly. Pt incontinent of liquid BM on entry and required max A +2 to clean and mobility to EOB. Max A to complete a squat pivot transfer to chair due to below listed deficits. Pt appears to demonstrate a significant functional decline. Feel patient will benefit from intensive inpatient follow-up therapy, >3 hours/day to maximize functional level of independence to facilitate safe DC home with husband. BP elevated end of session 202/78 - Nsg made aware. Acute OT to follow.      If plan is discharge home, recommend the following:   A lot of help with walking and/or transfers;A lot of help with bathing/dressing/bathroom;Assistance with cooking/housework;Direct supervision/assist for medications management;Direct supervision/assist for financial management;Assist for transportation;Help with stairs or ramp for entrance     Functional Status Assessment   Patient has had a recent decline in their functional status and demonstrates the ability to make significant improvements in function in a reasonable and predictable amount of time.     Equipment Recommendations    None recommended by OT     Recommendations for Other Services   Rehab consult     Precautions/Restrictions   Precautions Precautions: Fall Recall of Precautions/Restrictions: Impaired Precaution/Restrictions Comments: pt denies falls in past 6 months Restrictions Weight Bearing Restrictions Per Provider Order: No     Mobility Bed Mobility Overal bed mobility: Needs Assistance Bed Mobility: Supine to Sit     Supine to sit: Mod assist     General bed mobility comments: assist to raise trunk and pivot hips to EOB    Transfers Overall transfer level: Needs assistance Equipment used: Rolling walker (2 wheels) Transfers: Sit to/from Stand, Bed to chair/wheelchair/BSC Sit to Stand: Max assist          Lateral/Scoot Transfers: Max assist General transfer comment: Max A to power up, pt unable to come to full upright standing with RW with max A of 2, LLE buckled, reports she needs a special shoe for LLE (not in room). Max A for lateral scoot to recliner with arm dropped.      Balance Overall balance assessment: Needs assistance Sitting-balance support: Feet supported, No upper extremity supported Sitting balance-Leahy Scale: Fair     Standing balance support: Bilateral upper extremity supported Standing balance-Leahy Scale: Zero                             ADL either performed or assessed with clinical judgement   ADL Overall ADL's : Needs assistance/impaired Eating/Feeding: Set up   Grooming: Set up;Sitting   Upper Body Bathing: Minimal assistance;Sitting   Lower Body Bathing: Maximal assistance;Bed level   Upper Body Dressing : Minimal assistance;Sitting   Lower Body Dressing: Maximal assistance;Bed  level   Toilet Transfer: Maximal assistance;Squat-pivot   Toileting- Clothing Manipulation and Hygiene: Total assistance Toileting - Clothing Manipulation Details (indicate cue type and reason): unaware of being incontinenet of liquid stool      Functional mobility during ADLs: Maximal assistance;+2 for physical assistance       Vision Baseline Vision/History: 1 Wears glasses Ability to See in Adequate Light: 1 Impaired Additional Comments: reduced vision L eye     Perception         Praxis         Pertinent Vitals/Pain Pain Assessment Pain Assessment: No/denies pain     Extremity/Trunk Assessment Upper Extremity Assessment Upper Extremity Assessment: Generalized weakness (AROM WF; Decreased intrinsic strength L hand with digit contracture of 4h and 5th in claw hand deformity but functional)   Lower Extremity Assessment Lower Extremity Assessment: Defer to PT evaluation LLE Deficits / Details: pt reports reconstruction of L heel and that she has a special she she wears,  unable to perform SLR, L foot numb to light touch, LLE buckled in standing; hip/knee grossly 2/5; ankle -3/5 LLE Sensation: decreased light touch;history of peripheral neuropathy LLE Coordination: decreased gross motor;decreased fine motor   Cervical / Trunk Assessment Cervical / Trunk Assessment: Kyphotic   Communication Communication Communication: Impaired Factors Affecting Communication: Hearing impaired   Cognition Arousal: Alert Behavior During Therapy: WFL for tasks assessed/performed Cognition: No family/caregiver present to determine baseline, Cognition impaired     Awareness: Online awareness impaired, Intellectual awareness intact Memory impairment (select all impairments): Working [employer] fast streamer, Conservation officer, historic buildings Attention impairment (select first level of impairment): Selective attention Executive functioning impairment (select all impairments): Initiation, Organization, Sequencing, Reasoning, Problem solving                   Following commands: Impaired Following commands impaired: Only follows one step commands consistently     Cueing  General Comments   Cueing Techniques: Verbal cues;Tactile cues;Visual  cues  BP 202/78 after transfer, RN notified   Exercises     Shoulder Instructions      Home Living Family/patient expects to be discharged to:: Private residence Living Arrangements: Spouse/significant other Available Help at Discharge: Family;Available PRN/intermittently (husband wroks nights) Type of Home: House Home Access: Level entry     Home Layout: One level     Bathroom Shower/Tub: Tub/shower unit;Curtain   Firefighter: Standard Bathroom Accessibility: Yes How Accessible: Accessible via wheelchair Home Equipment: Wheelchair - Biomedical scientist (2 wheels);Rollator (4 wheels);Cane - single point;Hospital bed          Prior Functioning/Environment Prior Level of Function : Needs assist (May need clarification.)       Physical Assist : Mobility (physical);ADLs (physical) Mobility (physical): Transfers   Mobility Comments: husband assists with transfers, self propels WC, reports she was working on sit to stand, hasn't walked in a long time ADLs Comments: usually sponge baths; husband assists; husband helps with transfers; "lifts her into the tub onto her chair"    OT Problem List: Decreased strength;Decreased activity tolerance;Impaired balance (sitting and/or standing);Decreased cognition;Decreased safety awareness;Decreased knowledge of use of DME or AE;Cardiopulmonary status limiting activity   OT Treatment/Interventions: Self-care/ADL training;Therapeutic exercise;Energy conservation;DME and/or AE instruction;Therapeutic activities;Cognitive remediation/compensation;Patient/family education;Balance training      OT Goals(Current goals can be found in the care plan section)   Acute Rehab OT Goals Patient Stated Goal: home tomorrow OT Goal Formulation: Patient unable to participate in goal setting Time For Goal Achievement: 05/14/24 Potential to Achieve Goals: Good  ADL Goals Pt Will Perform Upper Body Bathing: with set-up;sitting Pt  Will Transfer to Toilet: with mod assist;bedside commode;squat pivot transfer Pt/caregiver will Perform Home Exercise Program: Increased strength;Both right and left upper extremity;With theraband;With Supervision;With written HEP provided Additional ADL Goal #1: Bed mobilty with CGA to increase independence with ADL tasks   OT Frequency:  Min 2X/week    Co-evaluation PT/OT/SLP Co-Evaluation/Treatment: Yes     OT goals addressed during session: ADL's and self-care      AM-PAC OT "6 Clicks" Daily Activity     Outcome Measure Help from another person eating meals?: A Little Help from another person taking care of personal grooming?: A Little Help from another person toileting, which includes using toliet, bedpan, or urinal?: Total Help from another person bathing (including washing, rinsing, drying)?: A Lot Help from another person to put on and taking off regular upper body clothing?: A Little Help from another person to put on and taking off regular lower body clothing?: A Lot 6 Click Score: 14   End of Session Equipment Utilized During Treatment: Gait belt Nurse Communication: Mobility status;Need for lift equipment (may need to use Stedy)  Activity Tolerance: Patient tolerated treatment well Patient left: in chair;with call bell/phone within reach;with chair alarm set  OT Visit Diagnosis: Unsteadiness on feet (R26.81);Other abnormalities of gait and mobility (R26.89);Muscle weakness (generalized) (M62.81);Other symptoms and signs involving cognitive function                Time: 1610-9604 OT Time Calculation (min): 41 min Charges:  OT General Charges $OT Visit: 1 Visit OT Evaluation $OT Eval Moderate Complexity: 1 Mod  Raman Fe[place], OT/L   Acute OT Clinical Specialist Acute Rehabilitation Services Pager [phone] Office [phone]   [facility] 04/30/2024, 11:42 AM

## 2024-04-30 NOTE — Progress Notes (Addendum)
 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  On presentation, she was hypotensive, hyperkalemic requiring treatment and was admitted to ICU for septic shock secondary to cholecystitis versus cholangitis versus enteritis and multiple infiltrates seen on CT and started on pressors and broad-spectrum IV antibiotics.  GI was consulted.  MRCP showed possible 7 mm stone in cystic duct without obstruction along with cholelithiasis: GI suggested that presentation is not compatible with cholangitis and recommended no plans for ERCP.  Hospitalized and complicated by elevated troponins as well requiring heparin  drip and cardiology consultation.  Subsequently, pressors have been discontinued and patient has been transferred to TRH service from 04/30/2024 onwards.  Assessment & Plan:   Septic shock: Present on admission Possible community-acquired bacterial bilateral pneumonia with possible acute enteritis: Present on admission Lactic acidosis: Present on admission - Presented with septic shock to ICU and has been on broad-spectrum antibiotics.  Imaging suggestive of bilateral infiltrates and enteritis.  Diarrhea improving.  Initially, there was a concern for possible cholangitis but MRCP showed possible 7 mm stone in cystic duct without obstruction along with cholelithiasis: GI suggested that presentation is not compatible with cholangitis and recommended no plans for ERCP. - Blood cultures negative so far.  Urine cultures negative so far. - Currently  on broad-spectrum antibiotics with Zosyn  and Zyvox: Complete 7-day course - Off pressors.  Blood pressure much improved.  Care transferred to TRH service from 04/30/2024 onwards  Acute metabolic encephalopathy - Possibly from above.  Mental status has much improved.  Monitor.  Fall precautions  Non-STEMI/troponin elevation - High sensitive troponins peaked to 3816.  Cardiology following.  Currently on heparin  drip, aspirin  and statin.  Currently chest pain-free.  Echo showed EF of 65 to 70%  At risk for adrenal insufficiency - Has been on prednisone  5 mg daily for 20 years.  Treated with stress dose steroids: Currently being tapered.  Continue tapering.  AKI on CKD 4 History of renal transplant x 3 - Creatinine 1.87 today and improving.  Possibly close to baseline.  Monitor - Outpatient follow-up with transplant team  Cholelithiasis Cystic duct stone - Outpatient follow-up with general surgery  Hypokalemia -Will replace.  Repeat a.m. labs  Hypomagnesemia -Resolved  Acute metabolic acidosis -Resolved  Leukocytosis -Resolved  Anemia of chronic disease Macrocytosis - Hemoglobin slightly drifting downwards.  Monitor.  Transfuse if hemoglobin is less than 7  Diabetes mellitus type 1 with hyperglycemia A1c 7.4.  Continue long-acting insulin  along with CBGs with SSI.  Carb modified diet  Physical deconditioning - PT eval  Goals of care - Hopeful to be followed by palliative care and used to be DNR.  Currently full code.  Overall prognosis very guarded given current hospitalization and comorbidities.  Palliative care consult for goals of care discussion  Medial sacral stage III pressure injury: Present on admission - Follow wound care consult recommendations  Chronic urinary retention with chronic indwelling Foley catheter - Continue Foley catheter.  Outpatient follow-up with urology  DVT prophylaxis: Heparin  drip Code Status: Full Family Communication: None at  bedside Disposition Plan: Status is: Inpatient Remains inpatient  appropriate because: Of severity of illness  Consultants: PCCM/GI/cardiology  Procedures: Echo  Antimicrobials:  Anti-infectives (From admission, onward)    Start     Dose/Rate Route Frequency Ordered Stop   04/28/24 1000  piperacillin -tazobactam (ZOSYN ) IVPB 3.375 g        3.375 g 12.5 mL/hr over 240 Minutes Intravenous Every 12 hours 04/28/24 0847     04/28/24 0900  linezolid (ZYVOX) IVPB 600 mg        600 mg 300 mL/hr over 60 Minutes Intravenous Every 12 hours 04/28/24 0847     04/28/24 0600  cefTRIAXone  (ROCEPHIN ) 1 g in sodium chloride  0.9 % 100 mL IVPB  Status:  Discontinued        1 g 200 mL/hr over 30 Minutes Intravenous Every 24 hours 04/27/24 2107 04/28/24 0816   04/27/24 2200  azithromycin  (ZITHROMAX ) 500 mg in sodium chloride  0.9 % 250 mL IVPB        500 mg 250 mL/hr over 60 Minutes Intravenous Every 24 hours 04/27/24 2107 04/29/24 2340   04/27/24 2200  metroNIDAZOLE  (FLAGYL ) IVPB 500 mg  Status:  Discontinued        500 mg 100 mL/hr over 60 Minutes Intravenous Every 12 hours 04/27/24 2146 04/28/24 0816   04/27/24 2015  piperacillin -tazobactam (ZOSYN ) IVPB 3.375 g        3.375 g 100 mL/hr over 30 Minutes Intravenous  Once 04/27/24 2008 04/27/24 2107        Subjective: Patient seen and examined at bedside.  Denies any current chest pain.  Feels better.  No fever, vomiting, abdominal pain reported.  Feels weak.  Objective: Vitals:   04/30/24 0400 04/30/24 0500 04/30/24 0544 04/30/24 0751  BP: (!) 162/65     Pulse: 63     Resp: 16     Temp:  98.1 F (36.7 C)  97.8 F (36.6 C)  TempSrc:  Oral  Oral  SpO2: 91%     Weight:   48.1 kg   Height:        Intake/Output Summary (Last 24 hours) at 04/30/2024 1121 Last data filed at 04/30/2024 0800 Gross per 24 hour  Intake 1439.56 ml  Output 2675 ml  Net -1235.44 ml   Filed Weights   04/28/24 0424 04/29/24 0705 04/30/24 0544  Weight: 46.9 kg 47  kg 48.1 kg    Examination:  General exam: Appears calm and comfortable.  On room air.  Looks chronically ill and deconditioned. Respiratory system: Bilateral decreased breath sounds at bases with scattered crackles Cardiovascular system: S1 & S2 heard, Rate controlled Gastrointestinal system: Abdomen is nondistended, soft and nontender. Normal bowel sounds heard. Extremities: No cyanosis, clubbing; right BKA present Central nervous system: Alert and oriented. No focal neurological deficits. Moving extremities Skin: No rashes, lesions or ulcers Psychiatry: Judgement and insight appear normal. Mood & affect appropriate.     Data Reviewed: I have personally reviewed following labs and imaging studies  CBC: Recent Labs  Lab 04/28/24 0505 04/28/24 1313 04/29/24 0509 04/29/24 1608 04/30/24 0841  WBC 21.4* 14.8* 13.5* 11.2* 10.3  HGB 9.7* 8.5* 7.5* 7.3* 7.6*  HCT 31.7* 25.7* 23.4* 22.5* 24.1*  MCV 105.7* 100.8* 100.4* 101.4* 104.3*  PLT 266 172 170 155 163   Basic Metabolic Panel: Recent Labs  Lab 04/27/24 1850 04/27/24 1855 04/28/24 0505 04/28/24 1759 04/29/24 0509 04/30/24 0828  NA 134*  --  135 135 137 139  K 5.2*  --  4.3 4.5 4.8 3.3*  CL 109  --  107 104 109 105  CO2 12*  --  18* 21* 22 23  GLUCOSE 200*  --  352* 110* 233* 121*  BUN 71*  --  63* 62* 55* 56*  CREATININE 2.50*  --  2.48* 2.57* 2.29* 1.87*  CALCIUM  9.8  --  8.6* 8.3* 7.6* 8.4*  MG  --  2.3 1.6*  --   --  2.4  PHOS  --  4.2 3.9  --   --   --    GFR: Estimated Creatinine Clearance: 23.1 mL/min (A) (by C-G formula based on SCr of 1.87 mg/dL (H)). Liver Function Tests: Recent Labs  Lab 04/27/24 1850 04/28/24 0505 04/29/24 0509 04/30/24 0828  AST 25 29 24 24   ALT 15 16 13 14   ALKPHOS 187* 165* 82 77  BILITOT 0.7 0.5 0.4 0.6  PROT 6.7 5.1* 4.5* 5.9*  ALBUMIN  3.2* 2.6* 2.1* 2.7*   Recent Labs  Lab 04/27/24 1850 04/28/24 0505  LIPASE 43 36   No results for input(s): "AMMONIA" in the last  168 hours. Coagulation Profile: No results for input(s): "INR", "PROTIME" in the last 168 hours. Cardiac Enzymes: No results for input(s): "CKTOTAL", "CKMB", "CKMBINDEX", "TROPONINI" in the last 168 hours. BNP (last 3 results) No results for input(s): "PROBNP" in the last 8760 hours. HbA1C: Recent Labs    04/28/24 0505  HGBA1C 7.4*   CBG: Recent Labs  Lab 04/29/24 1534 04/29/24 1933 04/29/24 2346 04/30/24 0458 04/30/24 0752  GLUCAP 191* 234* 366* 175* 117*   Lipid Profile: No results for input(s): "CHOL", "HDL", "LDLCALC", "TRIG", "CHOLHDL", "LDLDIRECT" in the last 72 hours. Thyroid  Function Tests: No results for input(s): "TSH", "T4TOTAL", "FREET4", "T3FREE", "THYROIDAB" in the last 72 hours. Anemia Panel: No results for input(s): "VITAMINB12", "FOLATE", "FERRITIN", "TIBC", "IRON", "RETICCTPCT" in the last 72 hours. Sepsis Labs: Recent Labs  Lab 04/28/24 0011 04/28/24 0250 04/28/24 0505 04/28/24 1553  PROCALCITON  --   --  8.97  --   LATICACIDVEN 4.0* 3.2* 4.0* 1.6    Recent Results (from the past 240 hours)  Culture, blood (Routine X 2) w Reflex to ID Panel     Status: None (Preliminary result)   Collection Time: 04/27/24  6:40 PM   Specimen: BLOOD  Result Value Ref Range Status   Specimen Description   Final    BLOOD LEFT ANTECUBITAL Performed at [facility], [address], [place], [place] [address]    Special Requests   Final    BOTTLES DRAWN AEROBIC AND ANAEROBIC Blood Culture results may not be optimal due to an inadequate volume of blood received in culture bottles Performed at [facility], [address], [place], [place] [address]    Culture   Final    NO GROWTH 3 DAYS Performed at [facility], [address] [place], [place] [address]    Report Status PENDING  Incomplete  Culture, blood (Routine X 2) w Reflex to ID Panel     Status: None (Preliminary result)   Collection Time: 04/27/24  6:45 PM    Specimen: BLOOD  Result Value Ref Range Status   Specimen Description   Final    BLOOD RIGHT ANTECUBITAL Performed at [facility], [address], [place], [place] [address]    Special Requests   Final    BOTTLES DRAWN AEROBIC AND ANAEROBIC Blood Culture results may not be optimal due to an inadequate volume of blood received in culture bottles Performed at [facility], [address] [place], [place] [address]    Culture   Final    NO GROWTH 3 DAYS Performed at [facility], [address] [place], [place] [address]    Report Status PENDING  Incomplete  MRSA Next Gen by PCR, Nasal     Status: None   Collection Time: 04/28/24 12:55 AM   Specimen: Nasal Mucosa; Nasal Swab  Result Value Ref Range Status   MRSA by PCR Next Gen NOT DETECTED NOT DETECTED Final    Comment: (NOTE) The GeneXpert MRSA Assay (FDA approved for NASAL specimens only), is one component of a comprehensive MRSA colonization surveillance program. It is not intended to diagnose MRSA infection nor to guide or monitor treatment for MRSA infections. Test performance is not FDA approved in patients less than 33 years old. Performed at [facility], [address] [place], [place] [address]   Remove urinary catheter to obtain Clean Catch urine culture     Status: None   Collection Time: 04/28/24  1:12 PM   Specimen: Urine, Clean Catch  Result Value Ref Range Status   Specimen Description   Final    URINE, CLEAN CATCH Performed at [facility], [address] [place], [place] [address]    Special Requests   Final    NONE Performed at [facility], [address] [place], [place] [address]    Culture   Final    NO GROWTH Performed at [facility], [address], [place], [place] [address]    Report Status 04/29/2024 FINAL  Final         Radiology Studies: MR ABDOMEN MRCP WO CONTRAST Result  Date: 04/28/2024 CLINICAL DATA:  Elevated bilirubin level.  Cholelithiasis. EXAM: MRI ABDOMEN WITHOUT CONTRAST  (INCLUDING MRCP) TECHNIQUE: Multiplanar multisequence MR imaging of the abdomen was performed. Heavily T2-weighted images of the biliary and pancreatic ducts were obtained, and three-dimensional MRCP images were rendered by post processing. COMPARISON:  Ultrasound earlier in the day.  CT yesterday FINDINGS: Mild to moderate motion degradation throughout. The MRCP images are especially motion degraded. Lower chest: Cardiomegaly.  Tiny bilateral pleural effusions. Hepatobiliary: No dominant liver lesion. The gallbladder is borderline distended and multiple dependent stones are seen. No pericholecystic edema or wall thickening identified. A 7 mm stone in the porta hepatis including on 19/13 and 12/3 is difficult to localize. Favored to be within the cystic duct. Mild intrahepatic biliary duct dilatation. The common duct is mildly dilated in the porta hepatis including at 1.0 cm on 11/03 and 32/11. Tapers distally, without distal obstructive stone. Pancreas: Pancreatic atrophy. Borderline pancreatic duct dilatation, followed to the level of the ampulla. No dominant pancreatic mass or correlate for the questioned CT abnormality. Spleen:  Iron deposition within. Adrenals/Urinary Tract: Normal adrenal glands. Marked bilateral renal atrophy. Stomach/Bowel: Jejunal wall thickening and mesenteric edema again identified including on 21/10. Small hiatal hernia. Proximal gastric underdistention. Scattered colonic diverticula. Vascular/Lymphatic: Normal caliber of the aorta and branch vessels. No retroperitoneal or retrocrural adenopathy. Other: Small volume abdominal ascites including in the left upper quadrant. Musculoskeletal: S shaped thoracolumbar spine curvature. IMPRESSION: 1. Mild to moderate motion degradation. 2. Cholelithiasis without specific evidence of acute cholecystitis. 3. Stone of 7 mm in the porta  hepatis is favored to be in the cystic duct, less likely the proximal common duct. Localization not possible secondary to motion. 4. Intra and extrahepatic biliary duct dilatation is mild, followed to the  level of the ampulla. Given elevated bilirubin, consider ERCP to exclude occult ampullary stenosis or mass. 5. Proximal enteritis, as on CT 6. Trace bilateral pleural effusions and small volume abdominal ascites. Electronically Signed   By: Lore Rode M.D.   On: 04/28/2024 17:37        Scheduled Meds:  aspirin   81 mg Oral Daily   atorvastatin   40 mg Oral Daily   Chlorhexidine  Gluconate Cloth  6 each Topical Daily   hydrocortisone  sod succinate (SOLU-CORTEF ) inj  50 mg Intravenous Q12H   insulin  aspart  0-9 Units Subcutaneous Q4H   insulin  aspart  1 Units Subcutaneous TID WC   insulin  glargine-yfgn  5 Units Subcutaneous Daily   levothyroxine   100 mcg Oral Q0600   pantoprazole  (PROTONIX ) IV  40 mg Intravenous Q24H   polyethylene glycol  17 g Oral Daily   senna  1 tablet Oral Daily   Continuous Infusions:  heparin  550 Units/hr (04/30/24 1114)   linezolid (ZYVOX) IV 600 mg (04/30/24 1119)   piperacillin -tazobactam (ZOSYN )  IV Stopped (04/30/24 0106)          Audria Leather, MD [facility] 04/30/2024, 11:21 AM

## 2024-05-01 DIAGNOSIS — Z7189 Other specified counseling: Secondary | ICD-10-CM | POA: Diagnosis not present

## 2024-05-01 DIAGNOSIS — A419 Sepsis, unspecified organism: Secondary | ICD-10-CM

## 2024-05-01 DIAGNOSIS — I959 Hypotension, unspecified: Secondary | ICD-10-CM

## 2024-05-01 DIAGNOSIS — R6521 Severe sepsis with septic shock: Secondary | ICD-10-CM | POA: Diagnosis not present

## 2024-05-01 DIAGNOSIS — K8050 Calculus of bile duct without cholangitis or cholecystitis without obstruction: Secondary | ICD-10-CM | POA: Diagnosis not present

## 2024-05-01 LAB — COMPREHENSIVE METABOLIC PANEL WITH GFR
ALT: 12 U/L (ref 0–44)
AST: 18 U/L (ref 15–41)
Albumin: 2.4 g/dL — ABNORMAL LOW (ref 3.5–5.0)
Alkaline Phosphatase: 54 U/L (ref 38–126)
Anion gap: 11 (ref 5–15)
BUN: 51 mg/dL — ABNORMAL HIGH (ref 8–23)
CO2: 22 mmol/L (ref 22–32)
Calcium: 8.1 mg/dL — ABNORMAL LOW (ref 8.9–10.3)
Chloride: 106 mmol/L (ref 98–111)
Creatinine, Ser: 2.14 mg/dL — ABNORMAL HIGH (ref 0.44–1.00)
GFR, Estimated: 25 mL/min — ABNORMAL LOW (ref >60)
Glucose, Bld: 158 mg/dL — ABNORMAL HIGH (ref 70–99)
Potassium: 2.9 mmol/L — ABNORMAL LOW (ref 3.5–5.1)
Sodium: 139 mmol/L (ref 135–145)
Total Bilirubin: 0.7 mg/dL (ref 0.0–1.2)
Total Protein: 5.3 g/dL — ABNORMAL LOW (ref 6.5–8.1)

## 2024-05-01 LAB — GLUCOSE, CAPILLARY
Glucose-Capillary: 226 mg/dL — ABNORMAL HIGH (ref 70–99)
Glucose-Capillary: 175 mg/dL — ABNORMAL HIGH (ref 70–99)
Glucose-Capillary: 136 mg/dL — ABNORMAL HIGH (ref 70–99)
Glucose-Capillary: 201 mg/dL — ABNORMAL HIGH (ref 70–99)

## 2024-05-01 LAB — CBC
HCT: 25.7 % — ABNORMAL LOW (ref 36.0–46.0)
Hemoglobin: 8 g/dL — ABNORMAL LOW (ref 12.0–15.0)
MCH: 32.1 pg (ref 26.0–34.0)
MCHC: 31.1 g/dL (ref 30.0–36.0)
MCV: 103.2 fL — ABNORMAL HIGH (ref 80.0–100.0)
Platelets: 157 10*3/uL (ref 150–400)
RBC: 2.49 MIL/uL — ABNORMAL LOW (ref 3.87–5.11)
RDW: 13.4 % (ref 11.5–15.5)
WBC: 8.9 10*3/uL (ref 4.0–10.5)
nRBC: 0 % (ref 0.0–0.2)

## 2024-05-01 LAB — MAGNESIUM: Magnesium: 2 mg/dL (ref 1.7–2.4)

## 2024-05-01 MED ORDER — HYDROCORTISONE SOD SUC (PF) 100 MG IJ SOLR
25.0000 mg | Freq: Every day | INTRAMUSCULAR | Status: DC
  Administered 2024-05-02 – 2024-05-05 (×4): 25 mg via INTRAVENOUS
  Filled 2024-05-01 (×4): qty 2

## 2024-05-01 MED ORDER — HEPARIN SODIUM (PORCINE) 5000 UNIT/ML IJ SOLN
5000.0000 [IU] | Freq: Three times a day (TID) | INTRAMUSCULAR | Status: DC
Start: 2024-05-01 — End: 2024-05-05
  Administered 2024-05-01 – 2024-05-05 (×14): 5000 [IU] via SUBCUTANEOUS
  Filled 2024-05-01 (×13): qty 1

## 2024-05-01 MED ORDER — AMLODIPINE BESYLATE 10 MG PO TABS
10.0000 mg | ORAL_TABLET | Freq: Every day | ORAL | Status: DC
  Administered 2024-05-01 – 2024-05-05 (×5): 10 mg via ORAL
  Filled 2024-05-01 (×5): qty 1

## 2024-05-01 MED ORDER — HYDRALAZINE HCL 25 MG PO TABS
25.0000 mg | ORAL_TABLET | Freq: Four times a day (QID) | ORAL | Status: DC
  Administered 2024-05-01 – 2024-05-05 (×16): 25 mg via ORAL
  Filled 2024-05-01 (×16): qty 1

## 2024-05-01 MED ORDER — POTASSIUM CHLORIDE 20 MEQ PO PACK
60.0000 meq | PACK | Freq: Once | ORAL | Status: AC
  Administered 2024-05-01: 60 meq via ORAL
  Filled 2024-05-01: qty 3

## 2024-05-01 NOTE — NC FL2 (Signed)
 [place]  MEDICAID FL2 LEVEL OF CARE FORM     IDENTIFICATION  Patient Name: [name] Birthdate: Mar 06, 1961 Sex: female Admission Date (Current Location): 04/27/2024  [facility] and [place] Number:  Producer, television/film/video and Address:  [facility] [place] [facility],  [address], [place] [address]      Provider Number: [id number]  Attending Physician Name and Address:  Audria Leather, MD  Relative Name and Phone Number:  Gullion,Ross (Spouse)  [phone] (Mobile)    Current Level of Care: Hospital Recommended Level of Care: Skilled Nursing Facility Prior Approval Number:    Date Approved/Denied:   PASRR Number: [id number]  Discharge Plan: SNF    Current Diagnoses: Patient Active Problem List   Diagnosis Date Noted   Septic shock (HCC) 04/27/2024   Gram-positive bacteremia 01/12/2024   Peripheral neuropathy 01/12/2024   Sepsis secondary to UTI (HCC) 01/09/2024   Pressure ulcer 01/09/2024   Acute cystitis without hematuria    Acute pyelonephritis    AKI (acute kidney injury) (HCC) 12/18/2021   Chronic indwelling Foley catheter 12/17/2021   S/P BKA (below knee amputation) unilateral, right (HCC) 12/17/2021   DNR (do not resuscitate)/DNI(Do Not Intubate) 12/17/2021   Chronic osteomyelitis of sacrum (HCC) 08/25/2021   Anemia 02/25/2021   Goals of care, counseling/discussion    Palliative care by specialist    Malnutrition of moderate degree 01/07/2021   Failure to thrive in adult    Unspecified severe protein-calorie malnutrition (HCC) 12/10/2020   Sacral decubitus ulcer, stage IV (HCC) 12/08/2020   Erosive esophagitis 12/08/2020   Hypothyroidism 12/08/2020   Depression 12/08/2020   Insulin  dependent type 1 diabetes mellitus (HCC) 12/08/2020   Hypoalbuminemia 12/07/2020   Coffee ground emesis    Gastroesophageal reflux disease with esophagitis and hemorrhage    Long-term use of immunosuppressant medication    Type 1 diabetes mellitus with complication, with  long term current use of insulin  pump (HCC) 09/27/2020   Pressure ulcer of BKA stump (HCC) 09/27/2020   Anemia secondary to renal failure 05/14/2020   History of renal transplant 05/14/2020   CKD stage 3b, GFR 30-44 ml/min (HCC) 05/14/2020   Hypertension 05/14/2020    Orientation RESPIRATION BLADDER Height & Weight     Place, Situation, Self, Time  Normal Continent Weight: 103 lb 13.4 oz (47.1 kg) Height:  5' 3.5" (161.3 cm)  BEHAVIORAL SYMPTOMS/MOOD NEUROLOGICAL BOWEL NUTRITION STATUS      Incontinent Diet (regular)  AMBULATORY STATUS COMMUNICATION OF NEEDS Skin   Limited Assist Verbally PU Stage and Appropriate Care, Surgical wounds                       Personal Care Assistance Level of Assistance  Bathing, Dressing, Feeding Bathing Assistance: Limited assistance Feeding assistance: Limited assistance Dressing Assistance: Limited assistance     Functional Limitations Info  Sight, Speech, Hearing Sight Info: Impaired Hearing Info: Impaired Speech Info: Adequate    SPECIAL CARE FACTORS FREQUENCY  PT (By licensed PT), OT (By licensed OT)     PT Frequency: 5x/wk OT Frequency: 5x/wk            Contractures Contractures Info: Not present    Additional Factors Info  Code Status, Allergies Code Status Info: Full code Allergies Info: No Known Allergies           Current Medications (05/01/2024):  This is the current hospital active medication list Current Facility-Administered Medications  Medication Dose Route Frequency Provider Last Rate Last Admin   acetaminophen  (TYLENOL )  tablet 650 mg  650 mg Oral Q6H PRN Albustami, Omar M, MD       amLODipine (NORVASC) tablet 10 mg  10 mg Oral Daily Audria Leather, MD   10 mg at 05/01/24 0865   aspirin  chewable tablet 81 mg  81 mg Oral Daily Joesph Mussel, DO   81 mg at 05/01/24 7846   atorvastatin  (LIPITOR) tablet 40 mg  40 mg Oral Daily Jaquita Merl P, DO   40 mg at 05/01/24 9629   Chlorhexidine  Gluconate Cloth 2 %  PADS 6 each  6 each Topical Daily Albustami, Omar M, MD   6 each at 04/30/24 1035   docusate sodium  (COLACE) capsule 100 mg  100 mg Oral BID PRN Albustami, Omar M, MD       heparin  injection 5,000 Units  5,000 Units Subcutaneous Q8H Audria Leather, MD   5,000 Units at 05/01/24 5284   hydrALAZINE  (APRESOLINE ) tablet 25 mg  25 mg Oral Q6H PRN Audria Leather, MD   25 mg at 05/01/24 0455   [START ON 05/02/2024] hydrocortisone  sodium succinate  (SOLU-CORTEF ) 100 MG injection 25 mg  25 mg Intravenous Daily Alekh, Kshitiz, MD       HYDROmorphone  (DILAUDID ) injection 0.5 mg  0.5 mg Intravenous Q3H PRN Albustami, Linna Richard, MD       HYDROmorphone  (DILAUDID ) injection 1 mg  1 mg Intravenous Q3H PRN Albustami, Omar M, MD   1 mg at 04/28/24 0127   insulin  aspart (novoLOG ) injection 0-15 Units  0-15 Units Subcutaneous TID WC Audria Leather, MD   5 Units at 05/01/24 1324   insulin  aspart (novoLOG ) injection 0-5 Units  0-5 Units Subcutaneous QHS Alekh, Kshitiz, MD       insulin  glargine-yfgn (SEMGLEE ) injection 5 Units  5 Units Subcutaneous Daily Jaquita Merl P, DO   5 Units at 05/01/24 4010   ipratropium-albuterol  (DUONEB) 0.5-2.5 (3) MG/3ML nebulizer solution 3 mL  3 mL Nebulization Q6H PRN Arlyne Bering, MD       levothyroxine  (SYNTHROID ) tablet 100 mcg  100 mcg Oral Q0600 Albustami, Omar M, MD   100 mcg at 05/01/24 0504   ondansetron  (ZOFRAN ) injection 4 mg  4 mg Intravenous Q6H PRN Albustami, Omar M, MD   4 mg at 04/29/24 2336   Oral care mouth rinse  15 mL Mouth Rinse PRN Arlyne Bering, MD       pantoprazole  (PROTONIX ) injection 40 mg  40 mg Intravenous Q24H Albustami, Omar M, MD   40 mg at 05/01/24 2725   piperacillin -tazobactam (ZOSYN ) IVPB 3.375 g  3.375 g Intravenous Q8H Shade, Christine E, RPH 12.5 mL/hr at 05/01/24 0602 3.375 g at 05/01/24 0602   polyethylene glycol (MIRALAX  / GLYCOLAX ) packet 17 g  17 g Oral Daily PRN Albustami, Omar M, MD       polyethylene glycol (MIRALAX  / GLYCOLAX ) packet 17 g   17 g Oral Daily Jaquita Merl P, DO       senna (SENOKOT) tablet 8.6 mg  1 tablet Oral Daily Joesph Mussel, DO         Discharge Medications: Please see discharge summary for a list of discharge medications.  Relevant Imaging Results:  Relevant Lab Results:   Additional Information SSN [id number] [place], [place]

## 2024-05-01 NOTE — TOC Progression Note (Signed)
 Transition of Care [facility]) - Progression Note    Patient Details  Name: [name] MRN: [id number] Date of Birth: 1961/02/26  Transition of Care [facility]) CM/SW Contact  Amaryllis Junior, [place] Phone Number: 05/01/2024, 1:25 PM  Clinical Narrative:    Pt recommended for CIR. CIR following to determine if pt is a candidate. CSW obtained PASRR and completed FL2. SNF referrals faxed out for bed offers in case pt is not accepted to CIR.     Barriers to Discharge: Continued Medical Work up  Expected Discharge Plan and Services In-house Referral: NA     Living arrangements for the past 2 months: Single Family Home                 DME Arranged: N/A DME Agency: NA       HH Arranged: NA HH Agency: NA         Social Determinants of Health (SDOH) Interventions SDOH Screenings   Food Insecurity: No Food Insecurity (04/28/2024)  Housing: Low Risk  (04/28/2024)  Transportation Needs: No Transportation Needs (04/28/2024)  Utilities: Not At Risk (04/28/2024)  Depression (PHQ2-9): Low Risk  (08/25/2021)  Financial Resource Strain: Low Risk  (04/27/2024)   Received from [facility]  Physical Activity: Unknown (04/27/2024)   Received from [facility]  Social Connections: Moderately Integrated (04/27/2024)   Received from [facility]  Stress: No Stress Concern Present (04/27/2024)   Received from [facility]  Tobacco Use: Low Risk  (04/27/2024)   Received from [facility]    Readmission Risk Interventions    04/30/2024   10:48 AM 01/16/2024   12:39 PM 12/21/2021    1:18 PM  Readmission Risk Prevention Plan  Transportation Screening Complete Complete Complete  PCP or Specialist Appt within 3-5 Days  Complete   HRI or Home Care Consult  Complete Complete  Social Work Consult for Recovery Care Planning/Counseling  Complete Complete  Palliative Care Screening  Not Applicable Not Applicable  Medication Review Oceanographer) Complete Complete Complete  PCP or Specialist appointment  within 3-5 days of discharge Complete    HRI or Home Care Consult Complete    SW Recovery Care/Counseling Consult Complete    Palliative Care Screening Not Applicable    Skilled Nursing Facility Not Applicable

## 2024-05-01 NOTE — Plan of Care (Signed)
  Problem: Education: Goal: Ability to describe self-care measures that may prevent or decrease complications (Diabetes Survival Skills Education) will improve Outcome: Progressing   Problem: Coping: Goal: Ability to adjust to condition or change in health will improve Outcome: Progressing   Problem: Fluid Volume: Goal: Ability to maintain a balanced intake and output will improve Outcome: Progressing   Problem: Metabolic: Goal: Ability to maintain appropriate glucose levels will improve Outcome: Progressing   Problem: Education: Goal: Knowledge of General Education information will improve Description: Including pain rating scale, medication(s)/side effects and non-pharmacologic comfort measures Outcome: Progressing   Problem: Clinical Measurements: Goal: Diagnostic test results will improve Outcome: Progressing   Problem: Activity: Goal: Risk for activity intolerance will decrease Outcome: Progressing   Problem: Nutrition: Goal: Adequate nutrition will be maintained Outcome: Progressing   Problem: Coping: Goal: Level of anxiety will decrease Outcome: Progressing

## 2024-05-01 NOTE — Inpatient Diabetes Management (Signed)
 Inpatient Diabetes Program Recommendations  AACE/ADA: New Consensus Statement on Inpatient Glycemic Control (2015)  Target Ranges:  Prepandial:   less than 140 mg/dL      Peak postprandial:   less than 180 mg/dL (1-2 hours)      Critically ill patients:  140 - 180 mg/dL   Lab Results  Component Value Date   GLUCAP 175 (H) 05/01/2024   HGBA1C 7.4 (H) 04/28/2024    Review of Glycemic Control  Diabetes history: DM1 Outpatient Diabetes medications: Insulin  pump - OmniPod with Dexcom G6 Current orders for Inpatient glycemic control: Lantus  5 daily, Novolog  0-15 TID with meals  HgbA1C - 7.4% Solucortef 25 mg in am CBGs today 226, 175. Eating 75-100%  Inpatient Diabetes Program Recommendations:    Consider changing Novolog  to 0-6 units TID as pt is Type 1 with CKD, renal transplant, and sensitive to insulin .  Consider adding Novolog  3 units TID with meals if eating > 50%  Spoke with pt at bedside yesterday afternoon. States she is eating well. Feeling a little better. Pump is off and pt receiving basal-bolus insulin . No questions/concerns.  Continue to follow.   Thank you. Joni Net, RD, LDN, CDCES Inpatient Diabetes Coordinator [phone]

## 2024-05-01 NOTE — Progress Notes (Signed)
 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  On presentation, she was hypotensive, hyperkalemic requiring treatment and was admitted to ICU for septic shock secondary to cholecystitis versus cholangitis versus enteritis and multiple infiltrates seen on CT and started on pressors and broad-spectrum IV antibiotics.  GI was consulted.  MRCP showed possible 7 mm stone in cystic duct without obstruction along with cholelithiasis: GI suggested that presentation is not compatible with cholangitis and recommended no plans for ERCP.  Hospitalized and complicated by elevated troponins as well requiring heparin  drip and cardiology consultation.  Subsequently, pressors have been discontinued and patient has been transferred to TRH service from 04/30/2024 onwards.  Assessment & Plan:   Septic shock: Present on admission; resolved Possible community-acquired bacterial bilateral pneumonia with possible acute enteritis: Present on admission Lactic acidosis: Present on admission - Presented with septic shock to ICU and has been on broad-spectrum antibiotics.  Imaging suggestive of bilateral infiltrates and enteritis.  Diarrhea improving.  Initially, there was a concern for possible cholangitis but MRCP showed possible 7 mm stone in cystic duct without obstruction along with cholelithiasis: GI suggested that presentation is not compatible with cholangitis and recommended no plans for ERCP. - Blood cultures negative so far.  Urine cultures negative so far. -  Currently on broad-spectrum antibiotics with Zosyn : Complete 7-day course.  Zyvox discontinued on 04/30/2024. - Off pressors.  Blood pressure much improved.  Shock has resolved.  Care transferred to TRH service from 04/30/2024 onwards  Acute metabolic encephalopathy - Possibly from above.  Mental status has much improved.  Monitor.  Fall precautions  Non-STEMI/troponin elevation - High sensitive troponins peaked to 3816.  Cardiology following.  Heparin  drip discontinued by cardiology on 04/30/2024. Continue aspirin  and statin.  Currently chest pain-free.  Echo showed EF of 65 to 70%  At risk for adrenal insufficiency - Has been on prednisone  5 mg daily for 20 years.  Treated with stress dose steroids: Currently being tapered.  Continue tapering.  AKI on CKD 4 History of renal transplant x 3 - Creatinine slightly worsening to 2.14 today.  Encourage oral intake.  Monitor - Outpatient follow-up with transplant team.  Hypertension - Blood pressure currently intermittently elevated.  Home lisinopril  will not be resumed because of AKI.  Patient has been started on amlodipine by cardiology from 04/30/2024 which will be continued.  Continue hydralazine  as needed.  Hypokalemia - Replace.  Repeat a.m. labs.  Cholelithiasis Cystic duct stone - Outpatient follow-up with general surgery  Hypomagnesemia -Resolved  Acute metabolic acidosis -Resolved  Leukocytosis -Resolved  Anemia of chronic disease Macrocytosis - Hemoglobin slightly drifting downwards.  Monitor.  Transfuse if hemoglobin is less than 7  Diabetes mellitus type 1 with hyperglycemia A1c 7.4.  Continue long-acting insulin  along with CBGs with SSI.  Carb modified diet  Physical deconditioning - PT recommending CIR.  Will consult CIR  Goals of care - Patient used to be followed by palliative care and used to be DNR.  Currently full code.  Overall prognosis very guarded given current hospitalization and comorbidities.  Palliative  care consult for goals of  care discussion  Medial sacral stage III pressure injury: Present on admission - Follow wound care consult recommendations  Chronic urinary retention with chronic indwelling Foley catheter - Continue Foley catheter.  Outpatient follow-up with urology  DVT prophylaxis: Heparin  subcutaneous Code Status: Full Family Communication: None at bedside Disposition Plan: Status is: Inpatient Remains inpatient appropriate because: Of severity of illness  Consultants: PCCM/GI/cardiology/palliative care  Procedures: Echo  Antimicrobials:  Anti-infectives (From admission, onward)    Start     Dose/Rate Route Frequency Ordered Stop   04/30/24 1200  piperacillin -tazobactam (ZOSYN ) IVPB 3.375 g        3.375 g 12.5 mL/hr over 240 Minutes Intravenous Every 8 hours 04/30/24 1158     04/28/24 1000  piperacillin -tazobactam (ZOSYN ) IVPB 3.375 g  Status:  Discontinued        3.375 g 12.5 mL/hr over 240 Minutes Intravenous Every 12 hours 04/28/24 0847 04/30/24 1158   04/28/24 0900  linezolid (ZYVOX) IVPB 600 mg  Status:  Discontinued        600 mg 300 mL/hr over 60 Minutes Intravenous Every 12 hours 04/28/24 0847 04/30/24 1157   04/28/24 0600  cefTRIAXone  (ROCEPHIN ) 1 g in sodium chloride  0.9 % 100 mL IVPB  Status:  Discontinued        1 g 200 mL/hr over 30 Minutes Intravenous Every 24 hours 04/27/24 2107 04/28/24 0816   04/27/24 2200  azithromycin  (ZITHROMAX ) 500 mg in sodium chloride  0.9 % 250 mL IVPB        500 mg 250 mL/hr over 60 Minutes Intravenous Every 24 hours 04/27/24 2107 04/29/24 2340   04/27/24 2200  metroNIDAZOLE  (FLAGYL ) IVPB 500 mg  Status:  Discontinued        500 mg 100 mL/hr over 60 Minutes Intravenous Every 12 hours 04/27/24 2146 04/28/24 0816   04/27/24 2015  piperacillin -tazobactam (ZOSYN ) IVPB 3.375 g        3.375 g 100 mL/hr over 30 Minutes Intravenous  Once 04/27/24 2008 04/27/24 2107        Subjective: Patient seen and examined at bedside.   Continues to feel weak but feels slightly better.  Denies any worsening shortness of breath, fever, vomiting. Objective: Vitals:   05/01/24 0400 05/01/24 0430 05/01/24 0500 05/01/24 0600  BP: (!) 188/71  (!) 179/83 (!) 194/76  Pulse: 63  72 67  Resp: (!) 38  17 (!) 22  Temp:  98.2 F (36.8 C)    TempSrc:  Oral    SpO2: 95%  96% 96%  Weight:   47.1 kg   Height:        Intake/Output Summary (Last 24 hours) at 05/01/2024 0719 Last data filed at 05/01/2024 0500 Gross per 24 hour  Intake 1068.29 ml  Output 3450 ml  Net -2381.71 ml   Filed Weights   04/29/24 0705 04/30/24 0544 05/01/24 0500  Weight: 47 kg 48.1 kg 47.1 kg    Examination:  General: Currently on room air.  No distress.  Chronically ill and deconditioned looking.   ENT/neck: No thyromegaly.  JVD is not elevated  respiratory: Decreased breath sounds at bases bilaterally with some crackles; no wheezing.  Intermittently tachypneic CVS: S1-S2 heard, rate controlled currently Abdominal: Soft, nontender, slightly distended; no organomegaly, bowel sounds are heard Extremities: Right BKA present; no left lower extremity cyanosis  CNS: Awake and alert.  No focal neurologic deficit.  Moves extremities Lymph: No obvious lymphadenopathy Skin: No obvious ecchymosis/lesions  psych: Affect, judgment and mood are normal  musculoskeletal: No  obvious joint swelling/deformity     Data Reviewed: I have personally reviewed following labs and imaging studies  CBC: Recent Labs  Lab 04/28/24 1313 04/29/24 0509 04/29/24 1608 04/30/24 0841 05/01/24 0312  WBC 14.8* 13.5* 11.2* 10.3 8.9  HGB 8.5* 7.5* 7.3* 7.6* 8.0*  HCT 25.7* 23.4* 22.5* 24.1* 25.7*  MCV 100.8* 100.4* 101.4* 104.3* 103.2*  PLT 172 170 155 163 157   Basic Metabolic Panel: Recent Labs  Lab 04/27/24 1855 04/28/24 0505 04/28/24 1759 04/29/24 0509 04/30/24 0828 05/01/24 0312  NA  --  135 135 137 139 139  K  --  4.3 4.5 4.8 3.3* 2.9*  CL  --  107 104 109 105  106  CO2  --  18* 21* 22 23 22   GLUCOSE  --  352* 110* 233* 121* 158*  BUN  --  63* 62* 55* 56* 51*  CREATININE  --  2.48* 2.57* 2.29* 1.87* 2.14*  CALCIUM   --  8.6* 8.3* 7.6* 8.4* 8.1*  MG 2.3 1.6*  --   --  2.4 2.0  PHOS 4.2 3.9  --   --   --   --    GFR: Estimated Creatinine Clearance: 19.7 mL/min (A) (by C-G formula based on SCr of 2.14 mg/dL (H)). Liver Function Tests: Recent Labs  Lab 04/27/24 1850 04/28/24 0505 04/29/24 0509 04/30/24 0828 05/01/24 0312  AST 25 29 24 24 18   ALT 15 16 13 14 12   ALKPHOS 187* 165* 82 77 54  BILITOT 0.7 0.5 0.4 0.6 0.7  PROT 6.7 5.1* 4.5* 5.9* 5.3*  ALBUMIN  3.2* 2.6* 2.1* 2.7* 2.4*   Recent Labs  Lab 04/27/24 1850 04/28/24 0505  LIPASE 43 36   No results for input(s): "AMMONIA" in the last 168 hours. Coagulation Profile: No results for input(s): "INR", "PROTIME" in the last 168 hours. Cardiac Enzymes: No results for input(s): "CKTOTAL", "CKMB", "CKMBINDEX", "TROPONINI" in the last 168 hours. BNP (last 3 results) No results for input(s): "PROBNP" in the last 8760 hours. HbA1C: No results for input(s): "HGBA1C" in the last 72 hours.  CBG: Recent Labs  Lab 04/30/24 0458 04/30/24 0752 04/30/24 1143 04/30/24 1637 04/30/24 2123  GLUCAP 175* 117* 233* 315* 183*   Lipid Profile: Recent Labs    04/30/24 1509  CHOL 174  HDL 86  LDLCALC 70  TRIG 88  CHOLHDL 2.0   Thyroid  Function Tests: No results for input(s): "TSH", "T4TOTAL", "FREET4", "T3FREE", "THYROIDAB" in the last 72 hours. Anemia Panel: No results for input(s): "VITAMINB12", "FOLATE", "FERRITIN", "TIBC", "IRON", "RETICCTPCT" in the last 72 hours. Sepsis Labs: Recent Labs  Lab 04/28/24 0011 04/28/24 0250 04/28/24 0505 04/28/24 1553  PROCALCITON  --   --  8.97  --   LATICACIDVEN 4.0* 3.2* 4.0* 1.6    Recent Results (from the past 240 hours)  Culture, blood (Routine X 2) w Reflex to ID Panel     Status: None (Preliminary result)   Collection Time: 04/27/24   6:40 PM   Specimen: BLOOD  Result Value Ref Range Status   Specimen Description   Final    BLOOD LEFT ANTECUBITAL Performed at [facility], [address], [place], [place] [address]    Special Requests   Final    BOTTLES DRAWN AEROBIC AND ANAEROBIC Blood Culture results may not be optimal due to an inadequate volume of blood received in culture bottles Performed at [facility], [address] [place], [place] [address]    Culture  Final    NO GROWTH 3 DAYS Performed at [facility], [address], [place], [place] [address]    Report Status PENDING  Incomplete  Culture, blood (Routine X 2) w Reflex to ID Panel     Status: None (Preliminary result)   Collection Time: 04/27/24  6:45 PM   Specimen: BLOOD  Result Value Ref Range Status   Specimen Description   Final    BLOOD RIGHT ANTECUBITAL Performed at [facility], [address], [place], [place] [address]    Special Requests   Final    BOTTLES DRAWN AEROBIC AND ANAEROBIC Blood Culture results may not be optimal due to an inadequate volume of blood received in culture bottles Performed at [facility], [address], [place], [place] [address]    Culture   Final    NO GROWTH 3 DAYS Performed at [facility], [address] [place], [place] [address]    Report Status PENDING  Incomplete  MRSA Next Gen by PCR, Nasal     Status: None   Collection Time: 04/28/24 12:55 AM   Specimen: Nasal Mucosa; Nasal Swab  Result Value Ref Range Status   MRSA by PCR Next Gen NOT DETECTED NOT DETECTED Final    Comment: (NOTE) The GeneXpert MRSA Assay (FDA approved for NASAL specimens only), is one component of a comprehensive MRSA colonization surveillance program. It is not intended to diagnose MRSA infection nor to guide or monitor treatment for MRSA infections. Test performance is not FDA approved in patients less than 71 years old. Performed  at [facility], [address], [place], [place] [address]   Remove urinary catheter to obtain Clean Catch urine culture     Status: None   Collection Time: 04/28/24  1:12 PM   Specimen: Urine, Clean Catch  Result Value Ref Range Status   Specimen Description   Final    URINE, CLEAN CATCH Performed at [facility], [address], [place], [place] [address]    Special Requests   Final    NONE Performed at [facility], [address], [place], [place] [address]    Culture   Final    NO GROWTH Performed at [facility], [address] [place], [place] [address]    Report Status 04/29/2024 FINAL  Final         Radiology Studies: No results found.       Scheduled Meds:  amLODipine  5 mg Oral Daily   aspirin   81 mg Oral Daily   atorvastatin   40 mg Oral Daily   Chlorhexidine  Gluconate Cloth  6 each Topical Daily   heparin  injection (subcutaneous)  5,000 Units Subcutaneous Q8H   hydrocortisone  sod succinate (SOLU-CORTEF ) inj  25 mg Intravenous Q12H   insulin  aspart  0-15 Units Subcutaneous TID WC   insulin  aspart  0-5 Units Subcutaneous QHS   insulin  glargine-yfgn  5 Units Subcutaneous Daily   levothyroxine   100 mcg Oral Q0600   pantoprazole  (PROTONIX ) IV  40 mg Intravenous Q24H   polyethylene glycol  17 g Oral Daily   senna  1 tablet Oral Daily   Continuous Infusions:  piperacillin -tazobactam (ZOSYN )  IV 3.375 g (05/01/24 0602)          Audria Leather, MD [facility] 05/01/2024, 7:19 AM

## 2024-05-01 NOTE — Consult Note (Signed)
 Consultation Note Date: 05/01/2024   Patient Name: [name]  DOB: [birth year]  MRN: [id number]  Age / Sex: 63 y.o., female  PCP: Authur Leghorn, MD Referring Physician: Audria Leather, MD  Reason for Consultation:  Goals of care. Was very sick. Used to be DNR in the past and was being followed by Palliative care as an outpatient.  HPI/Patient Profile: 63 y.o. female  with past medical history of multiple kidney transplants, pancreas transplant, DMI, hypothyroid, GERD, chronic anemia, R BKA, chronic foley admitted on 04/27/2024 with septic shock requiring pressor support in an immunocompromised patient with lactic acidosis- likely source thought to be cholecystitis/cholangitis vs pneumonia. She is currently improving. Palliative medicine consulted for GOC and code status discussion.    Primary Decision Maker PATIENT  Discussion: Chart reviewed including labs, progress notes, imaging from this and previous encounters.  Notably patient has goldnerod DNR form scanned into [facility]. Admitting H&P and progress notes reviewed- I don't see documentation of any discussion regarding code status.  Prior admissions reviewed- patient had DNR status during all previous admissions.  I met at the bedside with patient. She was awake, alert, able to participate in discussion.  She is from home. Her spouse works as an Barrister's clerk.  She is feeling much better. She tells me she was very sick.  She remembers having home Palliative assistance in the past, but says she "wasn't waiting around to die" and didn't need it any longer.  We discussed her current acute and chronic illnesses.  Advanced care planning- 30 mins With her permission we discussed advanced care planning and medical decision making.  She doesn't have HCPOA/Living Will, but does have a blank one that she's been reviewing.  She would not want to be kept  alive artificially in a terminal state.  We discussed her code status. I reviewed the goldenrod form on her chart. She says that DNR status is accurate. She would not want CPR if she died- she notes "I have been through enough" If she were detiorating and required life support/ventilation to keep her from going into cardiac/respiratory arrest she would not want to be put on a ventilator- she would rather be kept comfortable.  She continues to desire limited life prolonging interventions.  We discussed the possibility of needing some short term rehab/CIR. She notes that she did inpatient rehab at the [facility] clinic and enjoyed it. She would really like to go to CIR.      SUMMARY OF RECOMMENDATIONS -Continue interventions -Code status changed to DNR/DNI -Patient hopeful for d/c to CIR    Code Status/Advance Care Planning:   Code Status: Limited: Do not attempt resuscitation (DNR) -DNR-LIMITED -Do Not Intubate/DNI     Prognosis:   Unable to determine  Discharge Planning:  patient hopeful for CIR  Primary Diagnoses: Present on Admission:  Septic shock [facility])  Physical Exam Vitals and nursing note reviewed.  Constitutional:      General: She is not in acute distress.    Appearance: She is not ill-appearing.  Cardiovascular:     Rate and Rhythm: Normal rate.  Pulmonary:     Effort: Pulmonary effort is normal.  Neurological:     Mental Status: She is alert and oriented to person, place, and time.  Psychiatric:        Mood and Affect: Mood normal.        Behavior: Behavior normal.        Thought Content: Thought content normal.        Judgment: Judgment normal.     Vital Signs: BP (!) 209/76   Pulse 68   Temp 98.3 F (36.8 C) (Oral)   Resp 20   Ht 5' 3.5" (1.613 m)   Wt 47.1 kg   SpO2 98%   BMI 18.11 kg/m  Pain Scale: 0-10   Pain Score: 0-No pain   SpO2: SpO2: 98 % O2 Device:SpO2: 98 % O2 Flow Rate: .   IO: Intake/output summary:  Intake/Output Summary (Last 24  hours) at 05/01/2024 1342 Last data filed at 05/01/2024 0800 Gross per 24 hour  Intake 579.86 ml  Output 2725 ml  Net -2145.14 ml    LBM: Last BM Date : 04/30/24 Baseline Weight: Weight: 59 kg Most recent weight: Weight: 47.1 kg       Thank you for this consult. Palliative medicine will continue to follow and assist as needed.  Time Total: 60 minutes Signed by: Micki Alas, AGNP-C Palliative Medicine  Time includes:   Preparing to see the patient (e.g., review of tests) Obtaining and/or reviewing separately obtained history Performing a medically necessary appropriate examination and/or evaluation Counseling and educating the patient/family/caregiver Ordering medications, tests, or procedures Referring and communicating with other health care professionals (when not reported separately) Documenting clinical information in the electronic or other health record Independently interpreting results (not reported separately) and communicating results to the patient/family/caregiver Care coordination (not reported separately) Clinical documentation   Please contact Palliative Medicine Team phone at [phone] for questions and concerns.  For individual provider: See Tilford Foley

## 2024-05-01 NOTE — Progress Notes (Signed)
 Rounding Note    Patient Name: [name] Date of Encounter: 05/01/2024  [facility] Cardiologist: None   Subjective   Denies any CP or SOB.   Inpatient Medications    Scheduled Meds:  amLODipine  10 mg Oral Daily   aspirin   81 mg Oral Daily   atorvastatin   40 mg Oral Daily   Chlorhexidine  Gluconate Cloth  6 each Topical Daily   heparin  injection (subcutaneous)  5,000 Units Subcutaneous Q8H   hydrALAZINE   25 mg Oral QID   [START ON 05/02/2024] hydrocortisone  sod succinate (SOLU-CORTEF ) inj  25 mg Intravenous Daily   insulin  aspart  0-15 Units Subcutaneous TID WC   insulin  aspart  0-5 Units Subcutaneous QHS   insulin  glargine-yfgn  5 Units Subcutaneous Daily   levothyroxine   100 mcg Oral Q0600   pantoprazole  (PROTONIX ) IV  40 mg Intravenous Q24H   polyethylene glycol  17 g Oral Daily   senna  1 tablet Oral Daily   Continuous Infusions:  piperacillin -tazobactam (ZOSYN )  IV Stopped (05/01/24 1002)   PRN Meds: acetaminophen , docusate sodium , hydrALAZINE , HYDROmorphone  (DILAUDID ) injection, HYDROmorphone  (DILAUDID ) injection, ipratropium-albuterol , ondansetron  (ZOFRAN ) IV, mouth rinse, polyethylene glycol   Vital Signs    Vitals:   05/01/24 0500 05/01/24 0600 05/01/24 0755 05/01/24 0800  BP: (!) 179/83 (!) 194/76  (!) 209/76  Pulse: 72 67  68  Resp: 17 (!) 22  20  Temp:   98.1 F (36.7 C)   TempSrc:   Oral   SpO2: 96% 96%  98%  Weight: 47.1 kg     Height:        Intake/Output Summary (Last 24 hours) at 05/01/2024 1152 Last data filed at 05/01/2024 0800 Gross per 24 hour  Intake 819.86 ml  Output 2725 ml  Net -1905.14 ml      05/01/2024    5:00 AM 04/30/2024    5:44 AM 04/29/2024    7:05 AM  Last 3 Weights  Weight (lbs) 103 lb 13.4 oz 106 lb 0.7 oz 103 lb 9.9 oz  Weight (kg) 47.1 kg 48.1 kg 47 kg      Telemetry    NSR, no significant ventricular ectopy - Personally Reviewed  ECG    NSR with low voltage - Personally Reviewed  Physical Exam    GEN: No acute distress.   Neck: No JVD Cardiac: RRR, no murmurs, rubs, or gallops.  Respiratory: Clear to auscultation bilaterally. GI: Soft, nontender, non-distended  MS: No edema; No deformity. Neuro:  Nonfocal  Psych: Normal affect   Labs    High Sensitivity Troponin:   Recent Labs  Lab 04/28/24 0944 04/28/24 1553 04/28/24 1800 04/28/24 2059 04/29/24 0509  TROPONINIHS 2,792* 4,890* 5,036* 6,145* 3,816*     Chemistry Recent Labs  Lab 04/28/24 0505 04/28/24 1759 04/29/24 0509 04/30/24 0828 05/01/24 0312  NA 135   < > 137 139 139  K 4.3   < > 4.8 3.3* 2.9*  CL 107   < > 109 105 106  CO2 18*   < > 22 23 22   GLUCOSE 352*   < > 233* 121* 158*  BUN 63*   < > 55* 56* 51*  CREATININE 2.48*   < > 2.29* 1.87* 2.14*  CALCIUM  8.6*   < > 7.6* 8.4* 8.1*  MG 1.6*  --   --  2.4 2.0  PROT 5.1*  --  4.5* 5.9* 5.3*  ALBUMIN  2.6*  --  2.1* 2.7* 2.4*  AST 29  --  24 24 18   ALT 16  --  13 14 12   ALKPHOS 165*  --  82 77 54  BILITOT 0.5  --  0.4 0.6 0.7  GFRNONAA 21*   < > 23* 30* 25*  ANIONGAP 10   < > 6 11 11    < > = values in this interval not displayed.    Lipids  Recent Labs  Lab 04/30/24 1509  CHOL 174  TRIG 88  HDL 86  LDLCALC 70  CHOLHDL 2.0    Hematology Recent Labs  Lab 04/29/24 1608 04/30/24 0841 05/01/24 0312  WBC 11.2* 10.3 8.9  RBC 2.22* 2.31* 2.49*  HGB 7.3* 7.6* 8.0*  HCT 22.5* 24.1* 25.7*  MCV 101.4* 104.3* 103.2*  MCH 32.9 32.9 32.1  MCHC 32.4 31.5 31.1  RDW 13.7 13.6 13.4  PLT 155 163 157   Thyroid  No results for input(s): "TSH", "FREET4" in the last 168 hours.  BNPNo results for input(s): "BNP", "PROBNP" in the last 168 hours.  DDimer No results for input(s): "DDIMER" in the last 168 hours.   Radiology    No results found.   Cardiac Studies   Echo 04/28/2024  1. Left ventricular ejection fraction, by estimation, is 65 to 70%. The  left ventricle has normal function. The left ventricle has no regional  wall motion abnormalities.  There is mild asymmetric left ventricular  hypertrophy of the basal and septal  segments. Left ventricular diastolic parameters were normal.   2. Right ventricular systolic function is normal. The right ventricular  size is normal. Tricuspid regurgitation signal is inadequate for assessing  PA pressure.   3. The mitral valve is abnormal. Trivial mitral valve regurgitation. No  evidence of mitral stenosis.   4. The aortic valve is tricuspid. There is mild calcification of the  aortic valve. There is mild thickening of the aortic valve. Aortic valve  regurgitation is mild. Aortic valve sclerosis is present, with no evidence  of aortic valve stenosis.   5. The inferior vena cava is normal in size with greater than 50%  respiratory variability, suggesting right atrial pressure of 3 mmHg.   Patient Profile     63 y.o. female with PMH of renal transplant, DM I on insulin  pump, HTN, hypothyroidism, and chronic anemia who was admitted with septic shock possibly 2/2 multifocal PNA vs cholecystitis/cholangitis and found to have elevated troponin  Assessment & Plan    Elevated troponin  - Trop 7-->50-->356-->777-->1799-->2792-->4890-->5036-->6145-->3816   - denies any chest pain. Echo 04/28/2024 no RWMA, EF 65-70%. L internal jugular central line. Coox 72% yesterday.  - Most  likely demand ischemia in setting of sepsis, anemia. Although initial EKG showed minimal ST elevation in the inferior leads, without contralateral ST depression. Subsequent EKG showed resolution. No further cardiac work up planned.  Pt asymptomatic   2  HTN  BP is elevated   Amlodipine added yesterday   Will add hydralzine today   Follow   3  HL   LDL 70  HDL 86    Follow    4  Hx renal tx  Cr 2.14   (1.87 yesterday)  5  Septic shock with elevated lactic acidosis  - Lactic acid 4-->3.2-->4-->1.6   - per primary team, off of pressor. Still on IV abx.  - seen by GI, suspicion for cholangitis low, no plan for ERCP in light  of normal LFT and no obstruction on MRCP  6  DM I on insulin  pump   For questions  or updates, please contact [facility] Please consult [email or web address] for contact info under        Signed, Ola Berger, MD  05/01/2024, 11:52 AM

## 2024-05-02 DIAGNOSIS — A419 Sepsis, unspecified organism: Secondary | ICD-10-CM | POA: Diagnosis not present

## 2024-05-02 DIAGNOSIS — R6521 Severe sepsis with septic shock: Secondary | ICD-10-CM | POA: Diagnosis not present

## 2024-05-02 LAB — CBC
HCT: 26.6 % — ABNORMAL LOW (ref 36.0–46.0)
Hemoglobin: 8.9 g/dL — ABNORMAL LOW (ref 12.0–15.0)
MCH: 33.2 pg (ref 26.0–34.0)
MCHC: 33.5 g/dL (ref 30.0–36.0)
MCV: 99.3 fL (ref 80.0–100.0)
Platelets: 108 10*3/uL — ABNORMAL LOW (ref 150–400)
RBC: 2.68 MIL/uL — ABNORMAL LOW (ref 3.87–5.11)
RDW: 13.3 % (ref 11.5–15.5)
WBC: 8.1 10*3/uL (ref 4.0–10.5)
nRBC: 0 % (ref 0.0–0.2)

## 2024-05-02 LAB — CULTURE, BLOOD (ROUTINE X 2)
Culture: NO GROWTH
Culture: NO GROWTH

## 2024-05-02 LAB — COMPREHENSIVE METABOLIC PANEL WITH GFR
ALT: 12 U/L (ref 0–44)
AST: 19 U/L (ref 15–41)
Albumin: 2.4 g/dL — ABNORMAL LOW (ref 3.5–5.0)
Alkaline Phosphatase: 52 U/L (ref 38–126)
Anion gap: 11 (ref 5–15)
BUN: 52 mg/dL — ABNORMAL HIGH (ref 8–23)
CO2: 22 mmol/L (ref 22–32)
Calcium: 8.1 mg/dL — ABNORMAL LOW (ref 8.9–10.3)
Chloride: 103 mmol/L (ref 98–111)
Creatinine, Ser: 2.1 mg/dL — ABNORMAL HIGH (ref 0.44–1.00)
GFR, Estimated: 26 mL/min — ABNORMAL LOW (ref >60)
Glucose, Bld: 207 mg/dL — ABNORMAL HIGH (ref 70–99)
Potassium: 4 mmol/L (ref 3.5–5.1)
Sodium: 136 mmol/L (ref 135–145)
Total Bilirubin: 0.6 mg/dL (ref 0.0–1.2)
Total Protein: 5.1 g/dL — ABNORMAL LOW (ref 6.5–8.1)

## 2024-05-02 LAB — GLUCOSE, CAPILLARY
Glucose-Capillary: 306 mg/dL — ABNORMAL HIGH (ref 70–99)
Glucose-Capillary: 251 mg/dL — ABNORMAL HIGH (ref 70–99)
Glucose-Capillary: 198 mg/dL — ABNORMAL HIGH (ref 70–99)
Glucose-Capillary: 230 mg/dL — ABNORMAL HIGH (ref 70–99)

## 2024-05-02 LAB — MAGNESIUM: Magnesium: 1.8 mg/dL (ref 1.7–2.4)

## 2024-05-02 MED ORDER — LOPERAMIDE HCL 2 MG PO CAPS
2.0000 mg | ORAL_CAPSULE | Freq: Two times a day (BID) | ORAL | Status: DC | PRN
  Administered 2024-05-02 – 2024-05-04 (×2): 2 mg via ORAL
  Filled 2024-05-02 (×2): qty 1

## 2024-05-02 MED ORDER — PANTOPRAZOLE SODIUM 40 MG PO TBEC
40.0000 mg | DELAYED_RELEASE_TABLET | Freq: Every day | ORAL | Status: DC
  Administered 2024-05-03 – 2024-05-05 (×3): 40 mg via ORAL
  Filled 2024-05-02 (×3): qty 1

## 2024-05-02 NOTE — Plan of Care (Signed)
 Palliative-   Chart reviewed. Patient continues to be stable.  GOC and code status have been clarified.  No acute Palliative needs at this time.   Micki Alas, AGNP-C Palliative Medicine  No charge

## 2024-05-02 NOTE — Progress Notes (Signed)
 Physical Therapy Treatment Patient Details Name: [name] MRN: [id number] DOB: 05-24-1961 Today's Date: 05/02/2024   History of Present Illness 63 yr old female admitted with nausea, vomiting, hypotension. Dx of septic shock possibly due to cholecystitis, AKI, acute metabolic encephalopathy. 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    PT Comments  Mod assist supine to sit. Attempted sit to stand with +2 total assist, pt unable to come to full stand with RW as LLE buckled. Bed to recliner lateral transfer with max assist. Pt reports she has a sliding board at home and that her husband can transfer her to her WC. Recommended use of sliding board at home for transfers.     If plan is discharge home, recommend the following: A lot of help with bathing/dressing/bathroom;Assistance with cooking/housework;Assist for transportation;Help with stairs or ramp for entrance;Two people to help with walking and/or transfers   Can travel by private vehicle        Equipment Recommendations  None recommended by PT    Recommendations for Other Services       Precautions / Restrictions Precautions Precautions: Fall Recall of Precautions/Restrictions: Impaired Precaution/Restrictions Comments: pt denies falls in past 6 months Restrictions Weight Bearing Restrictions Per Provider Order: No     Mobility  Bed Mobility Overal bed mobility: Needs Assistance Bed Mobility: Supine to Sit     Supine to sit: Mod assist     General bed mobility comments: assist to raise trunk and pivot hips to EOB    Transfers Overall transfer level: Needs assistance Equipment used: Rolling walker (2 wheels) Transfers: Sit to/from Stand, Bed to chair/wheelchair/BSC Sit to Stand: Total assist, +2 physical assistance          Lateral/Scoot Transfers: Max assist General  transfer comment: attempted sit to stand with L shoe with orthotic, pt unable to maintain upright standing with +2 total assist, LLE buckled; then performed lateral scoot transfer with max assist.  Pt reports she has a sliding board at home, recommended she and her husband use this at home.    Ambulation/Gait                   Stairs             Wheelchair Mobility     Tilt Bed    Modified Rankin (Stroke Patients Only)       Balance Overall balance assessment: Needs assistance Sitting-balance support: Feet supported, No upper extremity supported Sitting balance-Leahy Scale: Fair Sitting balance - Comments: posterior lean when LLE is unsupported   Standing balance support: Bilateral upper extremity supported Standing balance-Leahy Scale: Zero                              Communication Communication Communication: Impaired Factors Affecting Communication: Hearing impaired  Cognition Arousal: Alert Behavior During Therapy: WFL for tasks assessed/performed   PT - Cognitive impairments: No family/caregiver present to determine baseline                       PT - Cognition Comments: oriented x 4, some confusion about recent events Following commands: Intact      Cueing    Exercises      General Comments General comments (skin integrity, edema, etc.): kyphotic trunk      Pertinent Vitals/Pain Pain  Assessment Pain Assessment: No/denies pain    Home Living                          Prior Function            PT Goals (current goals can now be found in the care plan section) Acute Rehab PT Goals Patient Stated Goal: likes to make greeting cards, wants to get stronger PT Goal Formulation: With patient Time For Goal Achievement: 05/14/24 Potential to Achieve Goals: Good Progress towards PT goals: Progressing toward goals    Frequency    Min 3X/week      PT Plan      Co-evaluation              AM-PAC  PT "6 Clicks" Mobility   Outcome Measure  Help needed turning from your back to your side while in a flat bed without using bedrails?: A Little Help needed moving from lying on your back to sitting on the side of a flat bed without using bedrails?: A Lot Help needed moving to and from a bed to a chair (including a wheelchair)?: Total Help needed standing up from a chair using your arms (e.g., wheelchair or bedside chair)?: Total Help needed to walk in hospital room?: Total Help needed climbing 3-5 steps with a railing? : Total 6 Click Score: 9    End of Session Equipment Utilized During Treatment: Gait belt Activity Tolerance: Patient tolerated treatment well Patient left: in chair;with chair alarm set;with call bell/phone within reach Nurse Communication: Mobility status PT Visit Diagnosis: Muscle weakness (generalized) (M62.81);Other abnormalities of gait and mobility (R26.89);Unsteadiness on feet (R26.81)     Time: 7829-5621 PT Time Calculation (min) (ACUTE ONLY): 21 min  Charges:    $Therapeutic Activity: 8-22 mins PT General Charges $$ ACUTE PT VISIT: 1 Visit                     Daymon Evans PT 05/02/2024  Acute Rehabilitation Services  Office [phone]

## 2024-05-02 NOTE — Plan of Care (Signed)
  Problem: Education: Goal: Ability to describe self-care measures that may prevent or decrease complications (Diabetes Survival Skills Education) will improve Outcome: Progressing Goal: Individualized Educational Video(s) Outcome: Progressing   Problem: Coping: Goal: Ability to adjust to condition or change in health will improve Outcome: Progressing   Problem: Fluid Volume: Goal: Ability to maintain a balanced intake and output will improve Outcome: Progressing   Problem: Health Behavior/Discharge Planning: Goal: Ability to identify and utilize available resources and services will improve Outcome: Progressing Goal: Ability to manage health-related needs will improve Outcome: Progressing   Problem: Metabolic: Goal: Ability to maintain appropriate glucose levels will improve Outcome: Progressing   Problem: Nutritional: Goal: Maintenance of adequate nutrition will improve Outcome: Progressing Goal: Progress toward achieving an optimal weight will improve Outcome: Progressing   Problem: Skin Integrity: Goal: Risk for impaired skin integrity will decrease Outcome: Progressing   Problem: Tissue Perfusion: Goal: Adequacy of tissue perfusion will improve Outcome: Progressing   Problem: Education: Goal: Knowledge of General Education information will improve Description: Including pain rating scale, medication(s)/side effects and non-pharmacologic comfort measures Outcome: Progressing   Problem: Health Behavior/Discharge Planning: Goal: Ability to manage health-related needs will improve Outcome: Progressing   Problem: Clinical Measurements: Goal: Ability to maintain clinical measurements within normal limits will improve Outcome: Progressing Goal: Will remain free from infection Outcome: Progressing Goal: Diagnostic test results will improve Outcome: Progressing Goal: Respiratory complications will improve Outcome: Progressing Goal: Cardiovascular complication will  be avoided Outcome: Progressing   Problem: Activity: Goal: Risk for activity intolerance will decrease Outcome: Progressing   Problem: Nutrition: Goal: Adequate nutrition will be maintained Outcome: Progressing   Problem: Coping: Goal: Level of anxiety will decrease Outcome: Progressing   Problem: Elimination: Goal: Will not experience complications related to urinary retention Outcome: Progressing   Problem: Pain Managment: Goal: General experience of comfort will improve and/or be controlled Outcome: Progressing   Problem: Safety: Goal: Ability to remain free from injury will improve Outcome: Progressing   Problem: Skin Integrity: Goal: Risk for impaired skin integrity will decrease Outcome: Progressing

## 2024-05-02 NOTE — Progress Notes (Addendum)
 Rounding Note    Patient Name: [name] Date of Encounter: 05/02/2024  [facility] Cardiologist: None   Subjective   Denies any CP or SOB.   Inpatient Medications    Scheduled Meds:  amLODipine  10 mg Oral Daily   aspirin   81 mg Oral Daily   atorvastatin   40 mg Oral Daily   Chlorhexidine  Gluconate Cloth  6 each Topical Daily   heparin  injection (subcutaneous)  5,000 Units Subcutaneous Q8H   hydrALAZINE   25 mg Oral QID   hydrocortisone  sod succinate (SOLU-CORTEF ) inj  25 mg Intravenous Daily   insulin  aspart  0-15 Units Subcutaneous TID WC   insulin  aspart  0-5 Units Subcutaneous QHS   insulin  glargine-yfgn  5 Units Subcutaneous Daily   levothyroxine   100 mcg Oral Q0600   pantoprazole  (PROTONIX ) IV  40 mg Intravenous Q24H   polyethylene glycol  17 g Oral Daily   senna  1 tablet Oral Daily   Continuous Infusions:  piperacillin -tazobactam (ZOSYN )  IV 3.375 g (05/02/24 0600)   PRN Meds: acetaminophen , docusate sodium , HYDROmorphone  (DILAUDID ) injection, HYDROmorphone  (DILAUDID ) injection, ipratropium-albuterol , loperamide , ondansetron  (ZOFRAN ) IV, mouth rinse, polyethylene glycol   Vital Signs    Vitals:   05/02/24 0340 05/02/24 0400 05/02/24 0500 05/02/24 0844  BP:  (!) 150/69    Pulse:  65  81  Resp:  18  18  Temp: 98 F (36.7 C)   98 F (36.7 C)  TempSrc: Axillary   Oral  SpO2:  96%  100%  Weight:   47.9 kg   Height:        Intake/Output Summary (Last 24 hours) at 05/02/2024 0903 Last data filed at 05/02/2024 0140 Gross per 24 hour  Intake 149.35 ml  Output 800 ml  Net -650.65 ml      05/02/2024    5:00 AM 05/01/2024    5:00 AM 04/30/2024    5:44 AM  Last 3 Weights  Weight (lbs) 105 lb 9.6 oz 103 lb 13.4 oz 106 lb 0.7 oz  Weight (kg) 47.9 kg 47.1 kg 48.1 kg      Telemetry    NSR without significant ventricular ectopy - Personally Reviewed  ECG    NSR with low voltage - Personally Reviewed  Physical Exam   GEN: No acute distress.    Neck: No JVD Cardiac: RRR, no murmurs, rubs, or gallops.  Respiratory: Clear to auscultation bilaterally. GI: Soft, nontender, non-distended  MS: No edema Neuro:  Nonfocal  Psych: Normal affect   Labs    High Sensitivity Troponin:   Recent Labs  Lab 04/28/24 0944 04/28/24 1553 04/28/24 1800 04/28/24 2059 04/29/24 0509  TROPONINIHS 2,792* 4,890* 5,036* 6,145* 3,816*     Chemistry Recent Labs  Lab 04/30/24 0828 05/01/24 0312 05/02/24 0258  NA 139 139 136  K 3.3* 2.9* 4.0  CL 105 106 103  CO2 23 22 22   GLUCOSE 121* 158* 207*  BUN 56* 51* 52*  CREATININE 1.87* 2.14* 2.10*  CALCIUM  8.4* 8.1* 8.1*  MG 2.4 2.0 1.8  PROT 5.9* 5.3* 5.1*  ALBUMIN  2.7* 2.4* 2.4*  AST 24 18 19   ALT 14 12 12   ALKPHOS 77 54 52  BILITOT 0.6 0.7 0.6  GFRNONAA 30* 25* 26*  ANIONGAP 11 11 11     Lipids  Recent Labs  Lab 04/30/24 1509  CHOL 174  TRIG 88  HDL 86  LDLCALC 70  CHOLHDL 2.0    Hematology Recent Labs  Lab 04/30/24 0841 05/01/24 1610  05/02/24 0258  WBC 10.3 8.9 8.1  RBC 2.31* 2.49* 2.68*  HGB 7.6* 8.0* 8.9*  HCT 24.1* 25.7* 26.6*  MCV 104.3* 103.2* 99.3  MCH 32.9 32.1 33.2  MCHC 31.5 31.1 33.5  RDW 13.6 13.4 13.3  PLT 163 157 108*   Thyroid  No results for input(s): "TSH", "FREET4" in the last 168 hours.  BNPNo results for input(s): "BNP", "PROBNP" in the last 168 hours.  DDimer No results for input(s): "DDIMER" in the last 168 hours.   Radiology    No results found.  Cardiac Studies   Echo 04/28/2024  1. Left ventricular ejection fraction, by estimation, is 65 to 70%. The  left ventricle has normal function. The left ventricle has no regional  wall motion abnormalities. There is mild asymmetric left ventricular  hypertrophy of the basal and septal  segments. Left ventricular diastolic parameters were normal.   2. Right ventricular systolic function is normal. The right ventricular  size is normal. Tricuspid regurgitation signal is inadequate for assessing   PA pressure.   3. The mitral valve is abnormal. Trivial mitral valve regurgitation. No  evidence of mitral stenosis.   4. The aortic valve is tricuspid. There is mild calcification of the  aortic valve. There is mild thickening of the aortic valve. Aortic valve  regurgitation is mild. Aortic valve sclerosis is present, with no evidence  of aortic valve stenosis.   5. The inferior vena cava is normal in size with greater than 50%  respiratory variability, suggesting right atrial pressure of 3 mmHg.   Patient Profile     63 y.o. female with PMH of renal transplant, DM I on insulin  pump, HTN, hypothyroidism, and chronic anemia who was admitted with septic shock possibly 2/2 multifocal PNA vs cholecystitis/cholangitis and found to have elevated troponin   Assessment & Plan    Elevated troponin             - Trop 7-->50-->356-->777-->1799-->2792-->4890-->5036-->6145-->3816              - denies any chest pain. Echo 04/28/2024 no RWMA, EF 65-70%. L internal jugular central line. Coox 72% yesterday.             - likely demand ischemia. Although initial EKG showed minimal ST elevation in the inferior leads, without contralateral ST depression. Subsequent EKG showed resolution. No further cardiac work up planned. Avoid severe anemia   Septic shock with elevated lactic acidosis             - Lactic acid 4-->3.2-->4-->1.6              - per primary team, off of pressor. Still on IV abx.             - seen by GI, suspicion for cholangitis low, no plan for ERCP in light of normal LFT and no obstruction on MRCP   Hypertension: off of pressor. Home lisinopril  on hold. Amlodipine and hydralazine  added. SBP 110-130s, will continue on the current therapy   DM I on insulin  pump   Anemia: hgb remain in the 7-8 range. Transfuse as needed.    H/o renal transplant: Cr 2.7 on arrival, baseline 1.7. Cr came down to 2.1 this morning.       For questions or updates, please contact [facility] Please consult [email or web address] for contact info under        Signed, Ervin Heath, PA  05/02/2024, 9:03 AM    PT seen and examined  I agree  with findings as noted above by Rosetta Cons above  Pt doing well  Breathing is good   No CP Lungs are CTA Cardiac RRR Ext No edema  BP is much improved today      Impression:  NSTEMI  Felt to represent demand ischemia in setting of sepsis   LVEF is normal    HTN   BP is good   Keep on current medical Rx         Will sign off   Please call with questions   Ola Berger MD

## 2024-05-02 NOTE — Progress Notes (Signed)
 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 On presentation, she was hypotensive, hyperkalemic requiring treatment and was admitted to ICU for septic shock secondary to cholecystitis versus cholangitis versus enteritis and multiple infiltrates seen on CT and started on pressors and broad-spectrum IV antibiotics. GI was consulted. MRCP showed possible 7 mm stone in cystic duct without obstruction along with cholelithiasis: GI suggested that presentation is not compatible with cholangitis and recommended no plans for ERCP. Hospital course complicated by elevated troponins as well requiring heparin  drip and cardiology consultation. Subsequently, pressors have been discontinued and patient has been transferred to TRH service from 04/30/2024 onwards.   Assessment & Plan:   Principal Problem:   Septic shock (HCC)   Septic shock : POA >  resolved. Community acquired bacterial bilateral pneumonia with possible acute enteritis: POA Lactic acidosis: POA > Resolved. -She presented with septic shock and admitted to ICU and has been on broad-spectrum antibiotics.  Imaging suggestive of bilateral infiltrates and enteritis.  Diarrhea improving.  Initially, there was a concern for possible cholangitis but MRCP showed possible 7 mm stone in cystic duct without obstruction along with cholelithiasis: GI suggested that presentation is not compatible with cholangitis and recommended no plans for ERCP. -Blood cultures negative so far.  Urine cultures  negative so far. -Currently on broad-spectrum antibiotics with Zosyn : Complete 7-day course.   -Zyvox discontinued on 04/30/2024. -Off pressors.  Blood pressure much improved.   -Shock has resolved.  Care transferred to TRH service from 04/30/2024 onwards   Acute metabolic encephalopathy: - Possibly from above.  Mental status has much improved.   - Monitor.  Fall precautions   Non-STEMI / Troponin elevation: - High sensitive troponins peaked to 3816.   - Cardiology following.  Heparin  drip discontinued by cardiology on 04/30/2024.  - Continue aspirin  and statin.  Currently chest pain-free.  Echo showed EF of 65 to 70%   At risk for adrenal insufficiency: -She has been on prednisone  5 mg daily for 20 years.   - Treated with stress dose steroids: Currently being tapered.  Continue tapering.   AKI on CKD 4: History of renal transplant x 3 - Creatinine improving today to 2.10.  Encourage oral intake.  Monitor - Outpatient follow-up with transplant team.   Hypertension: - Blood pressure currently intermittently elevated.  Home lisinopril  will not be resumed because of AKI.  Patient has been started on amlodipine by cardiology from 04/30/2024 which will be continued.  Continue hydralazine  as needed.   Hypokalemia - Replaced. Resolved.   Cholelithiasis: Cystic duct stone: - Outpatient follow-up with general surgery   Hypomagnesemia: -Resolved   Acute metabolic acidosis: -Resolved   Leukocytosis: -Resolved   Anemia of chronic disease: Macrocytosis - Hemoglobin slightly drifting downwards.  Monitor.   -Transfuse if hemoglobin is less than 7   Diabetes mellitus type 1 with hyperglycemia: HbA1c 7.4.  Continue long-acting insulin  along with CBGs with SSI.   Carb modified diet   Physical deconditioning - PT recommending CIR. CIR Consulted.   Goals of care - Patient used to be followed by palliative care and  used to be DNR.  Currently full code.  Overall prognosis very guarded given  current hospitalization and comorbidities.  Palliative care consulted for goals of care discussion, status changed to DNR.   Medial sacral stage III pressure injury: POA: - Follow wound care consult recommendations   Chronic urinary retention with chronic indwelling Foley catheter: - Continue Foley catheter.  Outpatient follow-up with urology    DVT prophylaxis: Heparin  SQ Code Status: Full code Family Communication: No family at bed side Disposition Plan:   Status is: Inpatient Remains inpatient appropriate because: Severity of illness.    Consultants:  PCCM GI Cardiology Palliative care  Procedures: None  Antimicrobials:  Anti-infectives (From admission, onward)    Start     Dose/Rate Route Frequency Ordered Stop   04/30/24 1200  piperacillin -tazobactam (ZOSYN ) IVPB 3.375 g        3.375 g 12.5 mL/hr over 240 Minutes Intravenous Every 8 hours 04/30/24 1158     04/28/24 1000  piperacillin -tazobactam (ZOSYN ) IVPB 3.375 g  Status:  Discontinued        3.375 g 12.5 mL/hr over 240 Minutes Intravenous Every 12 hours 04/28/24 0847 04/30/24 1158   04/28/24 0900  linezolid (ZYVOX) IVPB 600 mg  Status:  Discontinued        600 mg 300 mL/hr over 60 Minutes Intravenous Every 12 hours 04/28/24 0847 04/30/24 1157   04/28/24 0600  cefTRIAXone  (ROCEPHIN ) 1 g in sodium chloride  0.9 % 100 mL IVPB  Status:  Discontinued        1 g 200 mL/hr over 30 Minutes Intravenous Every 24 hours 04/27/24 2107 04/28/24 0816   04/27/24 2200  azithromycin  (ZITHROMAX ) 500 mg in sodium chloride  0.9 % 250 mL IVPB        500 mg 250 mL/hr over 60 Minutes Intravenous Every 24 hours 04/27/24 2107 04/29/24 2340   04/27/24 2200  metroNIDAZOLE  (FLAGYL ) IVPB 500 mg  Status:  Discontinued        500 mg 100 mL/hr over 60 Minutes Intravenous Every 12 hours 04/27/24 2146 04/28/24 0816   04/27/24 2015  piperacillin -tazobactam (ZOSYN ) IVPB 3.375 g        3.375 g 100 mL/hr over 30 Minutes Intravenous  Once 04/27/24  2008 04/27/24 2107      Subjective: Patient was seen and examined at bedside.  Overnight events noted. Patient appears severely deconditioned. She reports feeling better,  blood pressure has been improved.    Objective: Vitals:   05/02/24 0340 05/02/24 0400 05/02/24 0500 05/02/24 0844  BP:  (!) 150/69    Pulse:  65  81  Resp:  18  18  Temp: 98 F (36.7 C)   98 F (36.7 C)  TempSrc: Axillary   Oral  SpO2:  96%  100%  Weight:   47.9 kg   Height:        Intake/Output Summary (Last 24 hours) at 05/02/2024 0919 Last data filed at 05/02/2024 0140 Gross per 24 hour  Intake 149.35 ml  Output 800 ml  Net -650.65 ml   Filed Weights   04/30/24 0544 05/01/24 0500 05/02/24 0500  Weight: 48.1 kg 47.1 kg 47.9 kg    Examination:  General exam: Appears calm and comfortable, deconditioned, not in any acute distress. Respiratory system: Clear to auscultation. Respiratory effort normal.  RR 15 Cardiovascular system: S1 & S2 heard, RRR. No JVD, murmurs, rubs, gallops or clicks. No pedal edema. Gastrointestinal system: Abdomen is non distended, soft and non tender. No organomegaly or masses felt.  Normal bowel sounds heard. Central nervous system: Alert and oriented x 3. No focal neurological deficits. Extremities: Right BKA, left lower extremity no edema no swelling. Skin: No rashes, lesions or ulcers Psychiatry: Judgement and insight appear normal. Mood & affect appropriate.     Data Reviewed: I have personally reviewed following labs and imaging studies  CBC: Recent Labs  Lab 04/29/24 0509 04/29/24 1608 04/30/24 0841 05/01/24 0312 05/02/24 0258  WBC 13.5* 11.2* 10.3 8.9 8.1  HGB 7.5* 7.3* 7.6* 8.0* 8.9*  HCT 23.4* 22.5* 24.1* 25.7* 26.6*  MCV 100.4* 101.4* 104.3* 103.2* 99.3  PLT 170 155 163 157 108*   Basic Metabolic Panel: Recent Labs  Lab 04/27/24 1855 04/28/24 0505 04/28/24 1759 04/29/24 0509 04/30/24 0828 05/01/24 0312 05/02/24 0258  NA  --  135 135 137 139 139  136  K  --  4.3 4.5 4.8 3.3* 2.9* 4.0  CL  --  107 104 109 105 106 103  CO2  --  18* 21* 22 23 22 22   GLUCOSE  --  352* 110* 233* 121* 158* 207*  BUN  --  63* 62* 55* 56* 51* 52*  CREATININE  --  2.48* 2.57* 2.29* 1.87* 2.14* 2.10*  CALCIUM   --  8.6* 8.3* 7.6* 8.4* 8.1* 8.1*  MG 2.3 1.6*  --   --  2.4 2.0 1.8  PHOS 4.2 3.9  --   --   --   --   --    GFR: Estimated Creatinine Clearance: 20.5 mL/min (A) (by C-G formula based on SCr of 2.1 mg/dL (H)). Liver Function Tests: Recent Labs  Lab 04/28/24 0505 04/29/24 0509 04/30/24 0828 05/01/24 0312 05/02/24 0258  AST 29 24 24 18 19   ALT 16 13 14 12 12   ALKPHOS 165* 82 77 54 52  BILITOT 0.5 0.4 0.6 0.7 0.6  PROT 5.1* 4.5* 5.9* 5.3* 5.1*  ALBUMIN  2.6* 2.1* 2.7* 2.4* 2.4*   Recent Labs  Lab 04/27/24 1850 04/28/24 0505  LIPASE 43 36   No results for input(s): "AMMONIA" in the last 168 hours. Coagulation Profile: No results for input(s): "INR", "PROTIME" in the last 168 hours. Cardiac Enzymes: No results for input(s): "CKTOTAL", "CKMB", "CKMBINDEX", "TROPONINI" in the last 168 hours. BNP (last 3 results) No results for input(s): "PROBNP" in the last 8760 hours. HbA1C: No results for input(s): "HGBA1C" in the last 72 hours. CBG: Recent Labs  Lab 05/01/24 0742 05/01/24 1122 05/01/24 1632 05/01/24 2128 05/02/24 0757  GLUCAP 226* 175* 136* 201* 306*   Lipid Profile: Recent Labs    04/30/24 1509  CHOL 174  HDL 86  LDLCALC 70  TRIG 88  CHOLHDL 2.0   Thyroid  Function Tests: No results for input(s): "TSH", "T4TOTAL", "FREET4", "T3FREE", "THYROIDAB" in the last 72 hours. Anemia Panel: No results for input(s): "VITAMINB12", "FOLATE", "FERRITIN", "TIBC", "IRON", "RETICCTPCT" in the last 72 hours. Sepsis Labs: Recent Labs  Lab 04/28/24 0011 04/28/24 0250 04/28/24 0505 04/28/24 1553  PROCALCITON  --   --  8.97  --   LATICACIDVEN 4.0* 3.2* 4.0* 1.6    Recent Results (from the past 240 hours)  Culture, blood  (Routine X 2) w Reflex to ID Panel     Status: None   Collection Time: 04/27/24  6:40 PM   Specimen: BLOOD  Result Value Ref Range Status   Specimen Description   Final    BLOOD LEFT ANTECUBITAL Performed at [facility], [address] [place], [place] [address]  Special Requests   Final    BOTTLES DRAWN AEROBIC AND ANAEROBIC Blood Culture results may not be optimal due to an inadequate volume of blood received in culture bottles Performed at [facility], [address] [place], [place] [address]    Culture   Final    NO GROWTH 5 DAYS Performed at [facility], [address] [place], [place] [address]    Report Status 05/02/2024 FINAL  Final  Culture, blood (Routine X 2) w Reflex to ID Panel     Status: None   Collection Time: 04/27/24  6:45 PM   Specimen: BLOOD  Result Value Ref Range Status   Specimen Description   Final    BLOOD RIGHT ANTECUBITAL Performed at [facility], [address] [place], [place] [address]    Special Requests   Final    BOTTLES DRAWN AEROBIC AND ANAEROBIC Blood Culture results may not be optimal due to an inadequate volume of blood received in culture bottles Performed at [facility], [address], [place], [place] [address]    Culture   Final    NO GROWTH 5 DAYS Performed at [facility], [address], [place], [place] [address]    Report Status 05/02/2024 FINAL  Final  MRSA Next Gen by PCR, Nasal     Status: None   Collection Time: 04/28/24 12:55 AM   Specimen: Nasal Mucosa; Nasal Swab  Result Value Ref Range Status   MRSA by PCR Next Gen NOT DETECTED NOT DETECTED Final    Comment: (NOTE) The GeneXpert MRSA Assay (FDA approved for NASAL specimens only), is one component of a comprehensive MRSA colonization surveillance program. It is not intended to diagnose MRSA infection nor to guide or monitor treatment for MRSA infections. Test performance  is not FDA approved in patients less than 75 years old. Performed at [facility], [address] [place], [place] [address]   Remove urinary catheter to obtain Clean Catch urine culture     Status: None   Collection Time: 04/28/24  1:12 PM   Specimen: Urine, Clean Catch  Result Value Ref Range Status   Specimen Description   Final    URINE, CLEAN CATCH Performed at [facility], [address] [place], [place] [address]    Special Requests   Final    NONE Performed at [facility], [address], [place], [place] [address]    Culture   Final    NO GROWTH Performed at [facility], [address], [place], [place] [address]    Report Status 04/29/2024 FINAL  Final    Radiology Studies: No results found.  Scheduled Meds:  amLODipine  10 mg Oral Daily   aspirin   81 mg Oral Daily   atorvastatin   40 mg Oral Daily   Chlorhexidine  Gluconate Cloth  6 each Topical Daily   heparin  injection (subcutaneous)  5,000 Units Subcutaneous Q8H   hydrALAZINE   25 mg Oral QID   hydrocortisone  sod succinate (SOLU-CORTEF ) inj  25 mg Intravenous Daily   insulin  aspart  0-15 Units Subcutaneous TID WC   insulin  aspart  0-5 Units Subcutaneous QHS   insulin  glargine-yfgn  5 Units Subcutaneous Daily   levothyroxine   100 mcg Oral Q0600   pantoprazole  (PROTONIX ) IV  40 mg Intravenous Q24H   polyethylene glycol  17 g Oral Daily   senna  1 tablet Oral Daily   Continuous  Infusions:  piperacillin -tazobactam (ZOSYN )  IV 3.375 g (05/02/24 0600)     LOS: 5 days    Time spent: 50 mins    Magdalene School, MD [facility]   If 7PM-7AM, please contact night-coverage

## 2024-05-03 DIAGNOSIS — R6521 Severe sepsis with septic shock: Secondary | ICD-10-CM | POA: Diagnosis not present

## 2024-05-03 DIAGNOSIS — A419 Sepsis, unspecified organism: Secondary | ICD-10-CM | POA: Diagnosis not present

## 2024-05-03 LAB — BASIC METABOLIC PANEL WITH GFR
Anion gap: 8 (ref 5–15)
BUN: 45 mg/dL — ABNORMAL HIGH (ref 8–23)
CO2: 23 mmol/L (ref 22–32)
Calcium: 8.7 mg/dL — ABNORMAL LOW (ref 8.9–10.3)
Chloride: 105 mmol/L (ref 98–111)
Creatinine, Ser: 2 mg/dL — ABNORMAL HIGH (ref 0.44–1.00)
GFR, Estimated: 27 mL/min — ABNORMAL LOW (ref >60)
Glucose, Bld: 222 mg/dL — ABNORMAL HIGH (ref 70–99)
Potassium: 3.3 mmol/L — ABNORMAL LOW (ref 3.5–5.1)
Sodium: 136 mmol/L (ref 135–145)

## 2024-05-03 LAB — GLUCOSE, CAPILLARY
Glucose-Capillary: 215 mg/dL — ABNORMAL HIGH (ref 70–99)
Glucose-Capillary: 171 mg/dL — ABNORMAL HIGH (ref 70–99)
Glucose-Capillary: 268 mg/dL — ABNORMAL HIGH (ref 70–99)
Glucose-Capillary: 286 mg/dL — ABNORMAL HIGH (ref 70–99)

## 2024-05-03 LAB — CBC
HCT: 26.7 % — ABNORMAL LOW (ref 36.0–46.0)
Hemoglobin: 8.2 g/dL — ABNORMAL LOW (ref 12.0–15.0)
MCH: 31.9 pg (ref 26.0–34.0)
MCHC: 30.7 g/dL (ref 30.0–36.0)
MCV: 103.9 fL — ABNORMAL HIGH (ref 80.0–100.0)
Platelets: 154 10*3/uL (ref 150–400)
RBC: 2.57 MIL/uL — ABNORMAL LOW (ref 3.87–5.11)
RDW: 13.1 % (ref 11.5–15.5)
WBC: 10.3 10*3/uL (ref 4.0–10.5)
nRBC: 0 % (ref 0.0–0.2)

## 2024-05-03 LAB — MAGNESIUM: Magnesium: 1.6 mg/dL — ABNORMAL LOW (ref 1.7–2.4)

## 2024-05-03 MED ORDER — POTASSIUM CHLORIDE 20 MEQ PO PACK: 40.0000 meq | PACK | Freq: Once | ORAL | Status: DC

## 2024-05-03 MED ORDER — MAGNESIUM SULFATE 2 GM/50ML IV SOLN
2.0000 g | Freq: Once | INTRAVENOUS | Status: AC
  Administered 2024-05-03: 2 g via INTRAVENOUS
  Filled 2024-05-03: qty 50

## 2024-05-03 MED ORDER — POTASSIUM CHLORIDE CRYS ER 20 MEQ PO TBCR
40.0000 meq | EXTENDED_RELEASE_TABLET | Freq: Once | ORAL | Status: AC
  Administered 2024-05-03: 40 meq via ORAL
  Filled 2024-05-03: qty 2

## 2024-05-03 NOTE — Plan of Care (Signed)
 Palliative-   Chart reviewed.  Patient continues to improve.  No Palliative needs.   Please call if acute Palliative needs arise.   Micki Alas, AGNP-C Palliative Medicine  No charge

## 2024-05-03 NOTE — Progress Notes (Addendum)
 Occupational Therapy Treatment Patient Details Name: [name] MRN: [id number] DOB: 01/03/1961 Today's Date: 05/03/2024   History of present illness 63 yr old female admitted with nausea, vomiting, hypotension. Dx of septic shock possibly due to cholecystitis, AKI, acute metabolic encephalopathy. 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   OT comments  The pt was assisted into sitting edge of bed. While EOB, she presented with noted reliance on lateral arm propping; she required min assist and cues to correct. She gently deferred attempts at out of bed, due to concerns about L LE weakness and buckling that appeared during the last therapy session. She was further instructed on ROM and therapeutic exercises to facilitate improved joint flexibility and strengthening, needed to achieve improved ADL performance. Pt reported one of goals is to be able to ambulate using her R LE prosthesis. Continue OT plan of care.         If plan is discharge home, recommend the following:  A lot of help with walking and/or transfers;A lot of help with bathing/dressing/bathroom;Assistance with cooking/housework;Help with stairs or ramp for entrance   Equipment Recommendations  None recommended by OT    Recommendations for Other Services      Precautions / Restrictions Restrictions Weight Bearing Restrictions Per Provider Order: No Other Position/Activity Restrictions: old R BKA. pt reports use of a special L LE boot/shoe when mobilizing       Mobility Bed Mobility Overal bed mobility: Needs Assistance Bed Mobility: Supine to Sit, Sit to Supine     Supine to sit: Mod assist, Used rails, HOB elevated Sit to supine: Mod assist   General bed mobility comments: She required assist for B LE management onto and off the bed. Once seated EOB, she presented with  heavily reliance on lateral arm propping; she needed occasional min assist and cues to correct.    Transfers                   General transfer comment: She gently deferred attempts at out of bed, due to concerns about L LE weakness and buckling that appeared during the last therapy session     Balance     Sitting balance-Leahy Scale: Fair            ADL either performed or assessed with clinical judgement                      Pertinent Vitals/ Pain       Pain Assessment Pain Assessment: No/denies pain   Frequency  Min 2X/week        Progress Toward Goals  OT Goals(current goals can now be found in the care plan section)  Progress towards OT goals: Progressing toward goals  Acute Rehab OT Goals Patient Stated Goal: to eventually walk with her R LE prosthesis OT Goal Formulation: With patient Time For Goal Achievement: 05/14/24 Potential to Achieve Goals: Good  Plan         AM-PAC OT "6 Clicks" Daily Activity     Outcome Measure   Help from another person eating meals?: None Help from another person taking care of personal grooming?: A Little Help from another person toileting, which includes using toliet, bedpan, or urinal?: A Lot Help from another person bathing (including washing, rinsing, drying)?: A Lot Help from another person to put on and taking off regular upper  body clothing?: A Little Help from another person to put on and taking off regular lower body clothing?: A Lot 6 Click Score: 16    End of Session Equipment Utilized During Treatment: Other (comment) (N/A)  OT Visit Diagnosis: Unsteadiness on feet (R26.81);Muscle weakness (generalized) (M62.81)   Activity Tolerance Patient tolerated treatment well   Patient Left in bed;with call bell/phone within reach;with bed alarm set   Nurse Communication Mobility status        Time: 8119-1478 OT Time Calculation (min): 18 min  Charges: OT General Charges $OT Visit: 1  Visit OT Treatments $Therapeutic Activity: 8-22 mins     Sheralyn Dies, OTR/L 05/03/2024, 5:22 PM

## 2024-05-03 NOTE — Plan of Care (Signed)
  Problem: Education: Goal: Ability to describe self-care measures that may prevent or decrease complications (Diabetes Survival Skills Education) will improve Outcome: Progressing   Problem: Coping: Goal: Ability to adjust to condition or change in health will improve Outcome: Progressing   Problem: Fluid Volume: Goal: Ability to maintain a balanced intake and output will improve Outcome: Progressing   Problem: Health Behavior/Discharge Planning: Goal: Ability to identify and utilize available resources and services will improve Outcome: Progressing Goal: Ability to manage health-related needs will improve Outcome: Progressing   Problem: Metabolic: Goal: Ability to maintain appropriate glucose levels will improve Outcome: Progressing   Problem: Nutritional: Goal: Maintenance of adequate nutrition will improve Outcome: Progressing Goal: Progress toward achieving an optimal weight will improve Outcome: Progressing   Problem: Tissue Perfusion: Goal: Adequacy of tissue perfusion will improve Outcome: Progressing   Problem: Education: Goal: Knowledge of General Education information will improve Description: Including pain rating scale, medication(s)/side effects and non-pharmacologic comfort measures Outcome: Progressing   Problem: Clinical Measurements: Goal: Ability to maintain clinical measurements within normal limits will improve Outcome: Progressing Goal: Will remain free from infection Outcome: Progressing Goal: Diagnostic test results will improve Outcome: Progressing Goal: Respiratory complications will improve Outcome: Progressing Goal: Cardiovascular complication will be avoided Outcome: Progressing   Problem: Activity: Goal: Risk for activity intolerance will decrease Outcome: Progressing   Problem: Nutrition: Goal: Adequate nutrition will be maintained Outcome: Progressing   Problem: Coping: Goal: Level of anxiety will decrease Outcome:  Progressing   Problem: Elimination: Goal: Will not experience complications related to bowel motility Outcome: Progressing   Problem: Pain Managment: Goal: General experience of comfort will improve and/or be controlled Outcome: Progressing   Problem: Safety: Goal: Ability to remain free from injury will improve Outcome: Progressing   Problem: Skin Integrity: Goal: Risk for impaired skin integrity will decrease Outcome: Progressing

## 2024-05-03 NOTE — Plan of Care (Signed)

## 2024-05-03 NOTE — Progress Notes (Signed)
 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 On presentation, she was hypotensive, hyperkalemic requiring treatment and was admitted to ICU for septic shock secondary to cholecystitis versus cholangitis versus enteritis and multiple infiltrates seen on CT and started on pressors and broad-spectrum IV antibiotics. GI was consulted. MRCP showed possible 7 mm stone in cystic duct without obstruction along with cholelithiasis: GI suggested that presentation is not compatible with cholangitis and recommended no plans for ERCP. Hospital course complicated by elevated troponins as well requiring heparin  drip and cardiology consultation. Subsequently, pressors have been discontinued and patient has been transferred to TRH service from 04/30/2024 onwards.   Assessment & Plan:   Principal Problem:   Septic shock (HCC)   Septic shock : POA >  resolved. Community acquired bacterial bilateral pneumonia with possible acute enteritis: POA Lactic acidosis: POA > Resolved. -She presented with septic shock and admitted to ICU and has been on broad-spectrum antibiotics.   -Imaging suggestive of bilateral infiltrates and enteritis.  Diarrhea improving.   -Initially, there was a concern for possible cholangitis but MRCP showed possible 7 mm stone in cystic duct without obstruction along with cholelithiasis: GI suggested that presentation is not compatible with cholangitis and recommended no plans for ERCP. -Blood cultures negative so far.  Urine  cultures negative so far. -Currently on broad-spectrum antibiotics with Zosyn : Complete 7-day course.   -Zyvox discontinued on 04/30/2024. -Off pressors.  Blood pressure much improved.   -Shock has resolved.  Care transferred to TRH service from 04/30/2024 onwards.   Acute metabolic encephalopathy: - Possibly from above.  Mental status has much improved.   - Monitor.  Fall precautions   Non-STEMI / Troponin elevation: - High sensitive troponins peaked to 3816.   - Cardiology following.  Heparin  drip discontinued by cardiology on 04/30/2024.  - Continue aspirin  and statin.  Currently chest pain-free.  Echo showed EF of 65 to 70%. - Cardiology signed off,  NSTEMI was secondary to sepsis / Demand ischemia.   At risk for adrenal insufficiency: -She has been on prednisone  5 mg daily for 20 years.   - Treated with stress dose steroids: Currently being tapered.  Continue tapering.   AKI on CKD 4: History of renal transplant x 3 - Creatinine improving today to 2.00.  Encourage oral intake.  Monitor - Outpatient follow-up with transplant team.   Hypertension: - Blood pressure currently intermittently elevated.  Home lisinopril  will not be resumed because of AKI.  Patient has been started on amlodipine by cardiology from 04/30/2024 which will be continued.  Continue hydralazine  as needed.   Hypokalemia: - Replaced. Continue to monitor   Cholelithiasis: Cystic duct stone: - Outpatient follow-up with general surgery   Hypomagnesemia: -Replaced. Continue to monitor   Acute metabolic acidosis: -Resolved   Leukocytosis: -Resolved   Anemia of chronic disease: Macrocytosis - Hemoglobin slightly drifting downwards.  Monitor.   -Transfuse if hemoglobin is less than 7   Diabetes mellitus type 1 with hyperglycemia: HbA1c 7.4.  Continue long-acting insulin  along with CBGs with SSI.   Carb modified diet   Physical deconditioning -  PT recommending CIR. CIR Consulted.   Goals of care -  Patient used to be followed by palliative care and used to be DNR.  Currently full code.  Overall prognosis very guarded given current hospitalization and comorbidities.  Palliative care consulted for goals of care discussion, status changed to DNR.   Medial sacral stage III pressure injury: POA: - Follow wound care consult recommendations   Chronic urinary retention with chronic indwelling Foley catheter: - Continue Foley catheter.  Outpatient follow-up with urology    DVT prophylaxis: Heparin  SQ Code Status: Full code Family Communication: No family at bed side Disposition Plan:   Status is: Inpatient Remains inpatient appropriate because: Severity of illness.    Consultants:  PCCM GI Cardiology Palliative care  Procedures: None  Antimicrobials:  Anti-infectives (From admission, onward)    Start     Dose/Rate Route Frequency Ordered Stop   04/30/24 1200  piperacillin -tazobactam (ZOSYN ) IVPB 3.375 g        3.375 g 12.5 mL/hr over 240 Minutes Intravenous Every 8 hours 04/30/24 1158     04/28/24 1000  piperacillin -tazobactam (ZOSYN ) IVPB 3.375 g  Status:  Discontinued        3.375 g 12.5 mL/hr over 240 Minutes Intravenous Every 12 hours 04/28/24 0847 04/30/24 1158   04/28/24 0900  linezolid (ZYVOX) IVPB 600 mg  Status:  Discontinued        600 mg 300 mL/hr over 60 Minutes Intravenous Every 12 hours 04/28/24 0847 04/30/24 1157   04/28/24 0600  cefTRIAXone  (ROCEPHIN ) 1 g in sodium chloride  0.9 % 100 mL IVPB  Status:  Discontinued        1 g 200 mL/hr over 30 Minutes Intravenous Every 24 hours 04/27/24 2107 04/28/24 0816   04/27/24 2200  azithromycin  (ZITHROMAX ) 500 mg in sodium chloride  0.9 % 250 mL IVPB        500 mg 250 mL/hr over 60 Minutes Intravenous Every 24 hours 04/27/24 2107 04/29/24 2340   04/27/24 2200  metroNIDAZOLE  (FLAGYL ) IVPB 500 mg  Status:  Discontinued        500 mg 100 mL/hr over 60 Minutes Intravenous Every 12 hours 04/27/24 2146 04/28/24 0816    04/27/24 2015  piperacillin -tazobactam (ZOSYN ) IVPB 3.375 g        3.375 g 100 mL/hr over 30 Minutes Intravenous  Once 04/27/24 2008 04/27/24 2107      Subjective: Patient was seen and examined at bedside.  Overnight events noted. Patient appears severely deconditioned.  She reports feeling better blood pressure has been improved. She denies any concerns.   Objective: Vitals:   05/03/24 0400 05/03/24 0500 05/03/24 0800 05/03/24 1000  BP:   (!) 170/64 127/60  Pulse:   78 78  Resp:   (!) 22 20  Temp: 98.4 F (36.9 C)  98.4 F (36.9 C)   TempSrc: Oral  Oral   SpO2:   98% 96%  Weight:  47.9 kg    Height:        Intake/Output Summary (Last 24 hours) at 05/03/2024 1043 Last data filed at 05/03/2024 1000 Gross per 24 hour  Intake 438.13 ml  Output 1175 ml  Net -736.87 ml   Filed Weights   05/01/24 0500 05/02/24 0500 05/03/24 0500  Weight: 47.1 kg 47.9 kg 47.9 kg    Examination:  General exam: Appears calm and comfortable, deconditioned, not in any acute distress. Respiratory system: CTA Bilaterally. Respiratory effort normal.  RR 15 Cardiovascular system: S1 & S2 heard, RRR. No JVD,  murmurs, rubs, gallops or clicks. No pedal edema. Gastrointestinal system: Abdomen is non distended, soft and non tender. Normal bowel sounds heard. Central nervous system: Alert and oriented x 3. No focal neurological deficits. Extremities: Right BKA, left lower extremity no edema no swelling. Skin: No rashes, lesions or ulcers Psychiatry: Judgement and insight appear normal. Mood & affect appropriate.     Data Reviewed: I have personally reviewed following labs and imaging studies  CBC: Recent Labs  Lab 04/29/24 1608 04/30/24 0841 05/01/24 0312 05/02/24 0258 05/03/24 0302  WBC 11.2* 10.3 8.9 8.1 10.3  HGB 7.3* 7.6* 8.0* 8.9* 8.2*  HCT 22.5* 24.1* 25.7* 26.6* 26.7*  MCV 101.4* 104.3* 103.2* 99.3 103.9*  PLT 155 163 157 108* 154   Basic Metabolic Panel: Recent Labs  Lab  04/27/24 1855 04/28/24 0505 04/28/24 1759 04/29/24 0509 04/30/24 0828 05/01/24 0312 05/02/24 0258 05/03/24 0935  NA  --  135   < > 137 139 139 136 136  K  --  4.3   < > 4.8 3.3* 2.9* 4.0 3.3*  CL  --  107   < > 109 105 106 103 105  CO2  --  18*   < > 22 23 22 22 23   GLUCOSE  --  352*   < > 233* 121* 158* 207* 222*  BUN  --  63*   < > 55* 56* 51* 52* 45*  CREATININE  --  2.48*   < > 2.29* 1.87* 2.14* 2.10* 2.00*  CALCIUM   --  8.6*   < > 7.6* 8.4* 8.1* 8.1* 8.7*  MG 2.3 1.6*  --   --  2.4 2.0 1.8 1.6*  PHOS 4.2 3.9  --   --   --   --   --   --    < > = values in this interval not displayed.   GFR: Estimated Creatinine Clearance: 21.5 mL/min (A) (by C-G formula based on SCr of 2 mg/dL (H)). Liver Function Tests: Recent Labs  Lab 04/28/24 0505 04/29/24 0509 04/30/24 0828 05/01/24 0312 05/02/24 0258  AST 29 24 24 18 19   ALT 16 13 14 12 12   ALKPHOS 165* 82 77 54 52  BILITOT 0.5 0.4 0.6 0.7 0.6  PROT 5.1* 4.5* 5.9* 5.3* 5.1*  ALBUMIN  2.6* 2.1* 2.7* 2.4* 2.4*   Recent Labs  Lab 04/27/24 1850 04/28/24 0505  LIPASE 43 36   No results for input(s): "AMMONIA" in the last 168 hours. Coagulation Profile: No results for input(s): "INR", "PROTIME" in the last 168 hours. Cardiac Enzymes: No results for input(s): "CKTOTAL", "CKMB", "CKMBINDEX", "TROPONINI" in the last 168 hours. BNP (last 3 results) No results for input(s): "PROBNP" in the last 8760 hours. HbA1C: No results for input(s): "HGBA1C" in the last 72 hours. CBG: Recent Labs  Lab 05/02/24 0757 05/02/24 1205 05/02/24 1646 05/02/24 2139 05/03/24 0742  GLUCAP 306* 251* 198* 230* 215*   Lipid Profile: Recent Labs    04/30/24 1509  CHOL 174  HDL 86  LDLCALC 70  TRIG 88  CHOLHDL 2.0   Thyroid  Function Tests: No results for input(s): "TSH", "T4TOTAL", "FREET4", "T3FREE", "THYROIDAB" in the last 72 hours. Anemia Panel: No results for input(s): "VITAMINB12", "FOLATE", "FERRITIN", "TIBC", "IRON", "RETICCTPCT"  in the last 72 hours. Sepsis Labs: Recent Labs  Lab 04/28/24 0011 04/28/24 0250 04/28/24 0505 04/28/24 1553  PROCALCITON  --   --  8.97  --   LATICACIDVEN 4.0* 3.2* 4.0* 1.6    Recent Results (from the  past 240 hours)  Culture, blood (Routine X 2) w Reflex to ID Panel     Status: None   Collection Time: 04/27/24  6:40 PM   Specimen: BLOOD  Result Value Ref Range Status   Specimen Description   Final    BLOOD LEFT ANTECUBITAL Performed at [facility], [address], [place], [place] [address]    Special Requests   Final    BOTTLES DRAWN AEROBIC AND ANAEROBIC Blood Culture results may not be optimal due to an inadequate volume of blood received in culture bottles Performed at [facility], [address] [place], [place] [address]    Culture   Final    NO GROWTH 5 DAYS Performed at [facility], [address] [place], [place] [address]    Report Status 05/02/2024 FINAL  Final  Culture, blood (Routine X 2) w Reflex to ID Panel     Status: None   Collection Time: 04/27/24  6:45 PM   Specimen: BLOOD  Result Value Ref Range Status   Specimen Description   Final    BLOOD RIGHT ANTECUBITAL Performed at [facility], [address] [place], [place] [address]    Special Requests   Final    BOTTLES DRAWN AEROBIC AND ANAEROBIC Blood Culture results may not be optimal due to an inadequate volume of blood received in culture bottles Performed at [facility], [address], [place], [place] [address]    Culture   Final    NO GROWTH 5 DAYS Performed at [facility], [address] [place], [place] [address]    Report Status 05/02/2024 FINAL  Final  MRSA Next Gen by PCR, Nasal     Status: None   Collection Time: 04/28/24 12:55 AM   Specimen: Nasal Mucosa; Nasal Swab  Result Value Ref Range Status   MRSA by PCR Next Gen NOT DETECTED NOT DETECTED Final    Comment: (NOTE) The GeneXpert  MRSA Assay (FDA approved for NASAL specimens only), is one component of a comprehensive MRSA colonization surveillance program. It is not intended to diagnose MRSA infection nor to guide or monitor treatment for MRSA infections. Test performance is not FDA approved in patients less than 56 years old. Performed at [facility], [address] [place], [place] [address]   Remove urinary catheter to obtain Clean Catch urine culture     Status: None   Collection Time: 04/28/24  1:12 PM   Specimen: Urine, Clean Catch  Result Value Ref Range Status   Specimen Description   Final    URINE, CLEAN CATCH Performed at [facility], [address] [place], [place] [address]    Special Requests   Final    NONE Performed at [facility], [address], [place], [place] [address]    Culture   Final    NO GROWTH Performed at [facility], [address] [place], [place] [address]    Report Status 04/29/2024 FINAL  Final    Radiology Studies: No results found.  Scheduled Meds:  amLODipine  10 mg Oral Daily   aspirin   81 mg Oral Daily   atorvastatin   40 mg Oral Daily   Chlorhexidine  Gluconate Cloth  6 each Topical Daily   heparin  injection (subcutaneous)  5,000 Units Subcutaneous Q8H   hydrALAZINE   25 mg Oral QID   hydrocortisone  sod succinate (SOLU-CORTEF ) inj  25 mg Intravenous  Daily   insulin  aspart  0-15 Units Subcutaneous TID WC   insulin  aspart  0-5 Units Subcutaneous QHS   insulin  glargine-yfgn  5 Units Subcutaneous Daily   levothyroxine   100 mcg Oral Q0600   pantoprazole   40 mg Oral Daily   polyethylene glycol  17 g Oral Daily   potassium chloride   40 mEq Oral Once   senna  1 tablet Oral Daily   Continuous Infusions:  magnesium  sulfate bolus IVPB     piperacillin -tazobactam (ZOSYN )  IV Stopped (05/03/24 0940)     LOS: 6 days    Time spent: 50 mins    Magdalene School, MD [facility]   If  7PM-7AM, please contact night-coverage

## 2024-05-03 NOTE — TOC Progression Note (Signed)
 Transition of Care [facility]) - Progression Note    Patient Details  Name: [name] MRN: [id number] Date of Birth: May 29, 1961  Transition of Care [facility]) CM/SW Contact  Amaryllis Junior, [place] Phone Number: 05/03/2024, 4:07 PM  Clinical Narrative:    Pt PT/OT recommendation now [facility]. Pt accepting of recommendation. CSW attempting to find [facility] that accepts pt ins. Currently pending acceptance from [facility]. TOC to follow up.      Barriers to Discharge: Continued Medical Work up  Expected Discharge Plan and Services In-house Referral: NA     Living arrangements for the past 2 months: Single Family Home                 DME Arranged: N/A DME Agency: NA       HH Arranged: NA HH Agency: NA         Social Determinants of Health (SDOH) Interventions SDOH Screenings   Food Insecurity: No Food Insecurity (04/28/2024)  Housing: Low Risk  (04/28/2024)  Transportation Needs: No Transportation Needs (04/28/2024)  Utilities: Not At Risk (04/28/2024)  Depression (PHQ2-9): Low Risk  (08/25/2021)  Financial Resource Strain: Low Risk  (04/27/2024)   Received from [facility]  Physical Activity: Unknown (04/27/2024)   Received from [facility]  Social Connections: Moderately Integrated (04/27/2024)   Received from [facility]  Stress: No Stress Concern Present (04/27/2024)   Received from [facility]  Tobacco Use: Low Risk  (04/27/2024)   Received from [facility]    Readmission Risk Interventions    04/30/2024   10:48 AM 01/16/2024   12:39 PM 12/21/2021    1:18 PM  Readmission Risk Prevention Plan  Transportation Screening Complete Complete Complete  PCP or Specialist Appt within 3-5 Days  Complete   HRI or Home Care Consult  Complete Complete  Social Work Consult for Recovery Care Planning/Counseling  Complete Complete  Palliative Care Screening  Not Applicable Not Applicable  Medication Review Oceanographer) Complete Complete Complete  PCP or Specialist appointment within 3-5  days of discharge Complete    HRI or Home Care Consult Complete    SW Recovery Care/Counseling Consult Complete    Palliative Care Screening Not Applicable    Skilled Nursing Facility Not Applicable

## 2024-05-04 DIAGNOSIS — A419 Sepsis, unspecified organism: Secondary | ICD-10-CM | POA: Diagnosis not present

## 2024-05-04 DIAGNOSIS — R6521 Severe sepsis with septic shock: Secondary | ICD-10-CM | POA: Diagnosis not present

## 2024-05-04 LAB — MAGNESIUM: Magnesium: 2.4 mg/dL (ref 1.7–2.4)

## 2024-05-04 LAB — CBC
HCT: 25.1 % — ABNORMAL LOW (ref 36.0–46.0)
Hemoglobin: 7.9 g/dL — ABNORMAL LOW (ref 12.0–15.0)
MCH: 32.6 pg (ref 26.0–34.0)
MCHC: 31.5 g/dL (ref 30.0–36.0)
MCV: 103.7 fL — ABNORMAL HIGH (ref 80.0–100.0)
Platelets: 131 10*3/uL — ABNORMAL LOW (ref 150–400)
RBC: 2.42 MIL/uL — ABNORMAL LOW (ref 3.87–5.11)
RDW: 13.2 % (ref 11.5–15.5)
WBC: 10.6 10*3/uL — ABNORMAL HIGH (ref 4.0–10.5)
nRBC: 0.2 % (ref 0.0–0.2)

## 2024-05-04 LAB — BASIC METABOLIC PANEL WITH GFR
Anion gap: 9 (ref 5–15)
BUN: 52 mg/dL — ABNORMAL HIGH (ref 8–23)
CO2: 23 mmol/L (ref 22–32)
Calcium: 8.7 mg/dL — ABNORMAL LOW (ref 8.9–10.3)
Chloride: 103 mmol/L (ref 98–111)
Creatinine, Ser: 2.38 mg/dL — ABNORMAL HIGH (ref 0.44–1.00)
GFR, Estimated: 22 mL/min — ABNORMAL LOW (ref >60)
Glucose, Bld: 289 mg/dL — ABNORMAL HIGH (ref 70–99)
Potassium: 4.5 mmol/L (ref 3.5–5.1)
Sodium: 135 mmol/L (ref 135–145)

## 2024-05-04 LAB — PHOSPHORUS: Phosphorus: 3.7 mg/dL (ref 2.5–4.6)

## 2024-05-04 LAB — GLUCOSE, CAPILLARY
Glucose-Capillary: 308 mg/dL — ABNORMAL HIGH (ref 70–99)
Glucose-Capillary: 211 mg/dL — ABNORMAL HIGH (ref 70–99)
Glucose-Capillary: 203 mg/dL — ABNORMAL HIGH (ref 70–99)
Glucose-Capillary: 292 mg/dL — ABNORMAL HIGH (ref 70–99)

## 2024-05-04 MED ORDER — INSULIN GLARGINE-YFGN 100 UNIT/ML ~~LOC~~ SOLN
3.0000 [IU] | Freq: Two times a day (BID) | SUBCUTANEOUS | Status: DC
  Administered 2024-05-04 – 2024-05-05 (×2): 3 [IU] via SUBCUTANEOUS
  Filled 2024-05-04 (×3): qty 0.03

## 2024-05-04 NOTE — TOC Progression Note (Addendum)
 Transition of Care [facility]) - Progression Note    Patient Details  Name: [name] MRN: [id number] Date of Birth: 10-Jun-1961  Transition of Care [facility]) CM/SW Contact  Tessie Fila, RN Phone Number: 05/04/2024, 11:47 AM  Clinical Narrative:    Darline Eis accepted pt for HHPT/OT pending insurance authorization with BCBS comm as primary and Traditional Medicare as secondary. Spoke with pt and she understands that [place] will need auth before beginning services. Pt in agreement with discharge plan.  Addendum: Pt will need HHPT/OT orders at discharge.    Barriers to Discharge: Continued Medical Work up  Expected Discharge Plan and Services In-house Referral: NA     Living arrangements for the past 2 months: Single Family Home                 DME Arranged: N/A DME Agency: NA       HH Arranged: NA HH Agency: NA         Social Determinants of Health (SDOH) Interventions SDOH Screenings   Food Insecurity: No Food Insecurity (04/28/2024)  Housing: Low Risk  (04/28/2024)  Transportation Needs: No Transportation Needs (04/28/2024)  Utilities: Not At Risk (04/28/2024)  Depression (PHQ2-9): Low Risk  (08/25/2021)  Financial Resource Strain: Low Risk  (04/27/2024)   Received from [facility]  Physical Activity: Unknown (04/27/2024)   Received from [facility]  Social Connections: Moderately Integrated (04/27/2024)   Received from [facility]  Stress: No Stress Concern Present (04/27/2024)   Received from [facility]  Tobacco Use: Low Risk  (04/27/2024)   Received from [facility]    Readmission Risk Interventions    04/30/2024   10:48 AM 01/16/2024   12:39 PM 12/21/2021    1:18 PM  Readmission Risk Prevention Plan  Transportation Screening Complete Complete Complete  PCP or Specialist Appt within 3-5 Days  Complete   HRI or Home Care Consult  Complete Complete  Social Work Consult for Recovery Care Planning/Counseling  Complete Complete  Palliative Care Screening  Not  Applicable Not Applicable  Medication Review Oceanographer) Complete Complete Complete  PCP or Specialist appointment within 3-5 days of discharge Complete    HRI or Home Care Consult Complete    SW Recovery Care/Counseling Consult Complete    Palliative Care Screening Not Applicable    Skilled Nursing Facility Not Applicable

## 2024-05-04 NOTE — Plan of Care (Signed)

## 2024-05-04 NOTE — Progress Notes (Signed)
 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 On presentation, she was hypotensive, hyperkalemic requiring treatment and was admitted to ICU for septic shock secondary to cholecystitis versus cholangitis versus enteritis and multiple infiltrates seen on CT and started on pressors and broad-spectrum IV antibiotics. GI was consulted. MRCP showed possible 7 mm stone in cystic duct without obstruction along with cholelithiasis: GI suggested that presentation is not compatible with cholangitis and recommended no plans for ERCP. Hospital course complicated by elevated troponins as well requiring heparin  drip and cardiology consultation. Subsequently, pressors have been discontinued and patient has been transferred to TRH service from 04/30/2024 onwards.   Assessment & Plan:   Principal Problem:   Septic shock (HCC)   Septic shock : POA >  resolved. Community acquired bacterial bilateral pneumonia with possible acute enteritis: POA Lactic acidosis: POA > Resolved. -She presented with septic shock and admitted to ICU and has been on broad-spectrum antibiotics.   -Imaging suggestive of bilateral infiltrates and enteritis.  Diarrhea improving.   -Initially, there was a concern for possible cholangitis but MRCP showed possible 7 mm stone in cystic duct without obstruction along with cholelithiasis: GI suggested that presentation is not compatible with cholangitis and recommended no plans for ERCP. -Blood cultures negative so far.  Urine  cultures negative so far. -Currently on broad-spectrum antibiotics with Zosyn : Complete 7-day course.   -Zyvox  discontinued on 04/30/2024. -Off pressors.  Blood pressure much improved.   -Shock has resolved.  Care transferred to TRH service from 04/30/2024 onwards.   Acute metabolic encephalopathy: - Possibly from above.  Mental status has much improved, back to normal..   - Monitor.  Fall precautions.   Non-STEMI / Troponin elevation: - High sensitive troponins peaked to 3816.   - Cardiology following.  Heparin  drip discontinued by cardiology on 04/30/2024.  - Continue aspirin  and statin.  Currently chest pain-free.  Echo showed EF of 65 to 70%. - Cardiology signed off,  NSTEMI was secondary to sepsis / Demand ischemia.   At risk for adrenal insufficiency: -She has been on prednisone  5 mg daily for 20 years.   - Treated with stress dose steroids: Currently being tapered.  Continue tapering.   AKI on CKD 4: History of renal transplant x 3 - Creatinine up today to 2.38.  Encourage oral intake.  Monitor - Outpatient follow-up with transplant team.   Hypertension: - Blood pressure currently intermittently elevated.  Home lisinopril  will not be resumed because of AKI.  Patient has been started on amlodipine  by cardiology from 04/30/2024 which will be continued.  Continue hydralazine  as needed.   Hypokalemia: - Replaced. Resolved.   Cholelithiasis: Cystic duct stone: - Outpatient follow-up with general surgery   Hypomagnesemia: -Replaced. Resolved.   Acute metabolic acidosis: -Resolved.   Leukocytosis: -Resolved.   Anemia of chronic disease: Macrocytosis: - Hemoglobin slightly drifting downwards.  Monitor.   -Transfuse if hemoglobin is less than 7   Diabetes mellitus type 1 with hyperglycemia: HbA1c 7.4.  Continue long-acting insulin  along with CBGs with SSI.   Carb modified diet   Physical deconditioning - PT  recommending CIR. CIR Consulted.   Goals of care - Patient used  to be followed by palliative care and used to be DNR.  Currently full code.  Overall prognosis very guarded given current hospitalization and comorbidities.  Palliative care consulted for goals of care discussion, status changed to DNR.   Medial sacral stage III pressure injury: POA: - Follow wound care consult recommendations   Chronic urinary retention with chronic indwelling Foley catheter: - Continue Foley catheter.  Outpatient follow-up with urology    DVT prophylaxis: Heparin  SQ Code Status: Full code Family Communication: No family at bed side Disposition Plan:   Status is: Inpatient Remains inpatient appropriate because: Severity of illness.    Consultants:  PCCM GI Cardiology Palliative care  Procedures: None  Antimicrobials:  Anti-infectives (From admission, onward)    Start     Dose/Rate Route Frequency Ordered Stop   04/30/24 1200  piperacillin -tazobactam (ZOSYN ) IVPB 3.375 g        3.375 g 12.5 mL/hr over 240 Minutes Intravenous Every 8 hours 04/30/24 1158 05/04/24 0843   04/28/24 1000  piperacillin -tazobactam (ZOSYN ) IVPB 3.375 g  Status:  Discontinued        3.375 g 12.5 mL/hr over 240 Minutes Intravenous Every 12 hours 04/28/24 0847 04/30/24 1158   04/28/24 0900  linezolid  (ZYVOX ) IVPB 600 mg  Status:  Discontinued        600 mg 300 mL/hr over 60 Minutes Intravenous Every 12 hours 04/28/24 0847 04/30/24 1157   04/28/24 0600  cefTRIAXone  (ROCEPHIN ) 1 g in sodium chloride  0.9 % 100 mL IVPB  Status:  Discontinued        1 g 200 mL/hr over 30 Minutes Intravenous Every 24 hours 04/27/24 2107 04/28/24 0816   04/27/24 2200  azithromycin  (ZITHROMAX ) 500 mg in sodium chloride  0.9 % 250 mL IVPB        500 mg 250 mL/hr over 60 Minutes Intravenous Every 24 hours 04/27/24 2107 04/29/24 2340   04/27/24 2200  metroNIDAZOLE  (FLAGYL ) IVPB 500 mg  Status:  Discontinued        500 mg 100 mL/hr over 60 Minutes Intravenous Every 12 hours 04/27/24 2146 04/28/24 0816    04/27/24 2015  piperacillin -tazobactam (ZOSYN ) IVPB 3.375 g        3.375 g 100 mL/hr over 30 Minutes Intravenous  Once 04/27/24 2008 04/27/24 2107      Subjective: Patient was seen and examined at bedside.  Overnight events noted. Patient appears severely deconditioned.  She reports feeling better,  blood pressure has improved She denies any concerns.   Objective: Vitals:   05/03/24 1443 05/03/24 2118 05/03/24 2146 05/04/24 0434  BP: (!) 103/53 (!) 104/56 (!) 104/56 121/66  Pulse: 76 69  66  Resp:  18  16  Temp: 97.8 F (36.6 C) 97.7 F (36.5 C)  98.1 F (36.7 C)  TempSrc: Oral Oral    SpO2: 97% 97%  97%  Weight:    51 kg  Height:        Intake/Output Summary (Last 24 hours) at 05/04/2024 1318 Last data filed at 05/04/2024 1308 Gross per 24 hour  Intake 660 ml  Output 950 ml  Net -290 ml   Filed Weights   05/02/24 0500 05/03/24 0500 05/04/24 0434  Weight: 47.9 kg 47.9 kg 51 kg    Examination:  General exam: Appears calm and comfortable, deconditioned, not in any acute distress. Respiratory system: CTA Bilaterally. Respiratory effort normal.  RR 16 Cardiovascular system: S1 & S2 heard,  RRR. No JVD, murmurs, rubs, gallops or clicks. No pedal edema. Gastrointestinal system: Abdomen is non distended, soft and non tender. Normal bowel sounds heard. Central nervous system: Alert and oriented x 3. No focal neurological deficits. Extremities: Right BKA, left lower extremity no edema no swelling. Skin: No rashes, lesions or ulcers Psychiatry: Judgement and insight appear normal. Mood & affect appropriate.     Data Reviewed: I have personally reviewed following labs and imaging studies  CBC: Recent Labs  Lab 04/30/24 0841 05/01/24 0312 05/02/24 0258 05/03/24 0302 05/04/24 0549  WBC 10.3 8.9 8.1 10.3 10.6*  HGB 7.6* 8.0* 8.9* 8.2* 7.9*  HCT 24.1* 25.7* 26.6* 26.7* 25.1*  MCV 104.3* 103.2* 99.3 103.9* 103.7*  PLT 163 157 108* 154 131*   Basic Metabolic  Panel: Recent Labs  Lab 04/27/24 1855 04/28/24 0505 04/28/24 1759 04/30/24 0828 05/01/24 0312 05/02/24 0258 05/03/24 0935 05/04/24 0549  NA  --  135   < > 139 139 136 136 135  K  --  4.3   < > 3.3* 2.9* 4.0 3.3* 4.5  CL  --  107   < > 105 106 103 105 103  CO2  --  18*   < > 23 22 22 23 23   GLUCOSE  --  352*   < > 121* 158* 207* 222* 289*  BUN  --  63*   < > 56* 51* 52* 45* 52*  CREATININE  --  2.48*   < > 1.87* 2.14* 2.10* 2.00* 2.38*  CALCIUM   --  8.6*   < > 8.4* 8.1* 8.1* 8.7* 8.7*  MG 2.3 1.6*  --  2.4 2.0 1.8 1.6* 2.4  PHOS 4.2 3.9  --   --   --   --   --  3.7   < > = values in this interval not displayed.   GFR: Estimated Creatinine Clearance: 19.2 mL/min (A) (by C-G formula based on SCr of 2.38 mg/dL (H)). Liver Function Tests: Recent Labs  Lab 04/28/24 0505 04/29/24 0509 04/30/24 0828 05/01/24 0312 05/02/24 0258  AST 29 24 24 18 19   ALT 16 13 14 12 12   ALKPHOS 165* 82 77 54 52  BILITOT 0.5 0.4 0.6 0.7 0.6  PROT 5.1* 4.5* 5.9* 5.3* 5.1*  ALBUMIN  2.6* 2.1* 2.7* 2.4* 2.4*   Recent Labs  Lab 04/27/24 1850 04/28/24 0505  LIPASE 43 36   No results for input(s): "AMMONIA" in the last 168 hours. Coagulation Profile: No results for input(s): "INR", "PROTIME" in the last 168 hours. Cardiac Enzymes: No results for input(s): "CKTOTAL", "CKMB", "CKMBINDEX", "TROPONINI" in the last 168 hours. BNP (last 3 results) No results for input(s): "PROBNP" in the last 8760 hours. HbA1C: No results for input(s): "HGBA1C" in the last 72 hours. CBG: Recent Labs  Lab 05/03/24 1236 05/03/24 1713 05/03/24 2142 05/04/24 0727 05/04/24 1139  GLUCAP 171* 268* 286* 308* 211*   Lipid Profile: No results for input(s): "CHOL", "HDL", "LDLCALC", "TRIG", "CHOLHDL", "LDLDIRECT" in the last 72 hours.  Thyroid  Function Tests: No results for input(s): "TSH", "T4TOTAL", "FREET4", "T3FREE", "THYROIDAB" in the last 72 hours. Anemia Panel: No results for input(s): "VITAMINB12",  "FOLATE", "FERRITIN", "TIBC", "IRON", "RETICCTPCT" in the last 72 hours. Sepsis Labs: Recent Labs  Lab 04/28/24 0011 04/28/24 0250 04/28/24 0505 04/28/24 1553  PROCALCITON  --   --  8.97  --   LATICACIDVEN 4.0* 3.2* 4.0* 1.6    Recent Results (from the past 240 hours)  Culture, blood (Routine X 2)  w Reflex to ID Panel     Status: None   Collection Time: 04/27/24  6:40 PM   Specimen: BLOOD  Result Value Ref Range Status   Specimen Description   Final    BLOOD LEFT ANTECUBITAL Performed at [facility], [address] [place], [place] [address]    Special Requests   Final    BOTTLES DRAWN AEROBIC AND ANAEROBIC Blood Culture results may not be optimal due to an inadequate volume of blood received in culture bottles Performed at [facility], [address], [place], [place] [address]    Culture   Final    NO GROWTH 5 DAYS Performed at [facility], [address], [place], [place] [address]    Report Status 05/02/2024 FINAL  Final  Culture, blood (Routine X 2) w Reflex to ID Panel     Status: None   Collection Time: 04/27/24  6:45 PM   Specimen: BLOOD  Result Value Ref Range Status   Specimen Description   Final    BLOOD RIGHT ANTECUBITAL Performed at [facility], [address] [place], [place] [address]    Special Requests   Final    BOTTLES DRAWN AEROBIC AND ANAEROBIC Blood Culture results may not be optimal due to an inadequate volume of blood received in culture bottles Performed at [facility], [address] [place], [place] [address]    Culture   Final    NO GROWTH 5 DAYS Performed at [facility], [address] [place], [place] [address]    Report Status 05/02/2024 FINAL  Final  MRSA Next Gen by PCR, Nasal     Status: None   Collection Time: 04/28/24 12:55 AM   Specimen: Nasal Mucosa; Nasal Swab  Result Value Ref Range Status   MRSA by PCR Next Gen NOT DETECTED NOT  DETECTED Final    Comment: (NOTE) The GeneXpert MRSA Assay (FDA approved for NASAL specimens only), is one component of a comprehensive MRSA colonization surveillance program. It is not intended to diagnose MRSA infection nor to guide or monitor treatment for MRSA infections. Test performance is not FDA approved in patients less than 41 years old. Performed at [facility], [address] [place], [place] [address]   Remove urinary catheter to obtain Clean Catch urine culture     Status: None   Collection Time: 04/28/24  1:12 PM   Specimen: Urine, Clean Catch  Result Value Ref Range Status   Specimen Description   Final    URINE, CLEAN CATCH Performed at [facility], [address], [place], [place] [address]    Special Requests   Final    NONE Performed at [facility], [address] [place], [place] [address]    Culture   Final    NO GROWTH Performed at [facility], [address], [place], [place] [address]    Report Status 04/29/2024 FINAL  Final    Radiology Studies: No results found.  Scheduled Meds:  amLODipine   10 mg Oral Daily   aspirin   81 mg Oral Daily   atorvastatin   40 mg Oral Daily   Chlorhexidine  Gluconate Cloth  6 each Topical Daily   heparin  injection (subcutaneous)  5,000 Units Subcutaneous Q8H   hydrALAZINE   25 mg Oral QID   hydrocortisone  sod succinate (SOLU-CORTEF ) inj  25 mg Intravenous Daily   insulin  aspart  0-15 Units Subcutaneous  TID WC   insulin  aspart  0-5 Units Subcutaneous QHS   insulin  glargine-yfgn  3 Units Subcutaneous BID   levothyroxine   100 mcg Oral Q0600   pantoprazole   40 mg Oral Daily   polyethylene glycol  17 g Oral Daily   senna  1 tablet Oral Daily   Continuous Infusions:     LOS: 7 days    Time spent: 35 mins    Magdalene School, MD [facility]   If 7PM-7AM, please contact night-coverage

## 2024-05-04 NOTE — Inpatient Diabetes Management (Signed)
 Inpatient Diabetes Program Recommendations  AACE/ADA: New Consensus Statement on Inpatient Glycemic Control (2015)  Target Ranges:  Prepandial:   less than 140 mg/dL      Peak postprandial:   less than 180 mg/dL (1-2 hours)      Critically ill patients:  140 - 180 mg/dL   Lab Results  Component Value Date   GLUCAP 308 (H) 05/04/2024   HGBA1C 7.4 (H) 04/28/2024    Review of Glycemic Control  Latest Reference Range & Units 05/02/24 07:57 05/02/24 12:05 05/02/24 16:46 05/02/24 21:39 05/03/24 07:42 05/03/24 12:36 05/03/24 17:13 05/03/24 21:42 05/04/24 07:27  Glucose-Capillary 70 - 99 mg/dL 409 (H) 811 (H) 914 (H) 230 (H) 215 (H) 171 (H) 268 (H) 286 (H) 308 (H)  (H): Data is abnormally high  Diabetes history: DM1 Outpatient Diabetes medications: Insulin  pump - OmniPod with Dexcom G6 Current orders for Inpatient glycemic control: Semglee  5 units daily, Novolog  0-15 TID with meals and 0-5 units at bedtime, Solucortef 25 mg QD  Inpatient Diabetes Program Recommendations:    Question if small dose of basal insulin  SQ is lasting the full 24 hours as fastings are elevated.  She normally receives basal 0.25 units/hr with her pump (6 units daily).  Might consider:  Semglee  3 units BID starting this evening.  Will continue to follow while inpatient.  Thank you, Hays Lipschutz, MSN, CDCES Diabetes Coordinator Inpatient Diabetes Program [phone] (team pager from 8a-5p)

## 2024-05-04 NOTE — Progress Notes (Signed)
 Physical Therapy Treatment Patient Details Name: [name] MRN: [id number] DOB: June 07, 1961 Today's Date: 05/04/2024   History of Present Illness 63 yr old female admitted with nausea, vomiting, hypotension. Dx of septic shock possibly due to cholecystitis, AKI, acute metabolic encephalopathy. 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    PT Comments  PT - Cognition Comments: AxO x 3 pleasant and willing.  Lives home with Spouse and uses a wheelchair for mobility and can transfer with assist using a sliding board.  Pt stated her Spouse "is strong" and assists her with all transfers. Assisted OOB.  General bed mobility comments: Pt found incont BM in bed.  Used bed pad to complete scooting to EOB.  Once upright, Pt was able to static sit EOB at Supervision level. General transfer comment: First assisted from bed to [facility] 1/4 pivot "bear Hug" due to BM.  Assisted with peri care seated and partial stance "Bear hug" then another "Bear Hug" from [facility] to recliner.  Unable to use sliding board due to [facility] had no drop arm and Pt would have had to slide uphill as BSC was low.  Pt admits, "my husband is strong and he can just pick me up".  Pt stated, he also assists her with getting in/out car "using sliding board".  Pt plans to return home when medically stable. No equipment needs.  LPT rec HH PT.    If plan is discharge home, recommend the following: A lot of help with bathing/dressing/bathroom;Assistance with cooking/housework;Assist for transportation;Help with stairs or ramp for entrance;Two people to help with walking and/or transfers   Can travel by private vehicle        Equipment Recommendations  None recommended by PT    Recommendations for Other Services       Precautions / Restrictions Precautions Precautions: Fall Restrictions Weight Bearing  Restrictions Per Provider Order: No Other Position/Activity Restrictions: old R BKA. pt reports use of a special L LE boot/shoe when mobilizing.  Has a prosthetic leg but "its at home".     Mobility  Bed Mobility Overal bed mobility: Needs Assistance Bed Mobility: Supine to Sit     Supine to sit: Mod assist, Used rails, HOB elevated, Max assist     General bed mobility comments: Pt found incont BM in bed.  Used bed pad to complete scooting to EOB.  Once upright, Pt was able to static sit EOB at Supervision level.    Transfers Overall transfer level: Needs assistance Equipment used: Sliding board, None Transfers: Sit to/from Stand, Bed to chair/wheelchair/BSC Sit to Stand: Max assist, +2 safety/equipment          Lateral/Scoot Transfers: Max assist, +2 safety/equipment General transfer comment: First assisted from bed to [facility] 1/4 pivot "bear Hug" due to BM.  Assisted with peri care seated and partial stance "Bear hug" then another "Bear Hug" from [facility] to recliner.  Unable to use sliding board due to [facility] had no drop arm and Pt would have had to slide uphill as BSC was low.  Pt admits, "my husband is strong and he can just pick me up".  Pt stated, he also assists her with getting in/out car "using sliding board".    Ambulation/Gait               General Gait Details: non amb 3 years   Stairs  Wheelchair Mobility     Tilt Bed    Modified Rankin (Stroke Patients Only)       Balance                                            Communication    Cognition Arousal: Alert Behavior During Therapy: WFL for tasks assessed/performed   PT - Cognitive impairments: No apparent impairments                       PT - Cognition Comments: AxO x 3 pleasant and willing.  Lives home with Spouse and uses a wheelchair for mobility and can transfer with assist using a sliding board.  Pt stated her Spouse "is strong" and assists her with all  transfers. Following commands: Intact      Cueing    Exercises      General Comments        Pertinent Vitals/Pain Pain Assessment Pain Assessment: No/denies pain    Home Living                          Prior Function            PT Goals (current goals can now be found in the care plan section) Progress towards PT goals: Progressing toward goals    Frequency    Min 3X/week      PT Plan      Co-evaluation              AM-PAC PT "6 Clicks" Mobility   Outcome Measure  Help needed turning from your back to your side while in a flat bed without using bedrails?: A Lot Help needed moving from lying on your back to sitting on the side of a flat bed without using bedrails?: A Lot Help needed moving to and from a bed to a chair (including a wheelchair)?: A Lot Help needed standing up from a chair using your arms (e.g., wheelchair or bedside chair)?: A Lot Help needed to walk in hospital room?: Total Help needed climbing 3-5 steps with a railing? : Total 6 Click Score: 10    End of Session Equipment Utilized During Treatment: Gait belt Activity Tolerance: Patient tolerated treatment well Patient left: in chair;with chair alarm set;with call bell/phone within reach Nurse Communication: Mobility status PT Visit Diagnosis: Muscle weakness (generalized) (M62.81);Other abnormalities of gait and mobility (R26.89);Unsteadiness on feet (R26.81)     Time: 4098-1191 PT Time Calculation (min) (ACUTE ONLY): 23 min  Charges:    $Therapeutic Activity: 23-37 mins PT General Charges $$ ACUTE PT VISIT: 1 Visit                     Bess Broody  PTA Acute  Rehabilitation Services Office M-F          [phone]

## 2024-05-05 ENCOUNTER — Other Ambulatory Visit (HOSPITAL_COMMUNITY): Payer: Self-pay

## 2024-05-05 ENCOUNTER — Encounter (HOSPITAL_COMMUNITY): Payer: Self-pay

## 2024-05-05 DIAGNOSIS — A419 Sepsis, unspecified organism: Secondary | ICD-10-CM | POA: Diagnosis not present

## 2024-05-05 DIAGNOSIS — R6521 Severe sepsis with septic shock: Secondary | ICD-10-CM | POA: Diagnosis not present

## 2024-05-05 LAB — BASIC METABOLIC PANEL WITH GFR
Anion gap: 10 (ref 5–15)
BUN: 46 mg/dL — ABNORMAL HIGH (ref 8–23)
CO2: 19 mmol/L — ABNORMAL LOW (ref 22–32)
Calcium: 9.1 mg/dL (ref 8.9–10.3)
Chloride: 109 mmol/L (ref 98–111)
Creatinine, Ser: 1.87 mg/dL — ABNORMAL HIGH (ref 0.44–1.00)
GFR, Estimated: 30 mL/min — ABNORMAL LOW (ref >60)
Glucose, Bld: 254 mg/dL — ABNORMAL HIGH (ref 70–99)
Potassium: 4.5 mmol/L (ref 3.5–5.1)
Sodium: 138 mmol/L (ref 135–145)

## 2024-05-05 LAB — GLUCOSE, CAPILLARY
Glucose-Capillary: 266 mg/dL — ABNORMAL HIGH (ref 70–99)
Glucose-Capillary: 213 mg/dL — ABNORMAL HIGH (ref 70–99)

## 2024-05-05 LAB — CBC
HCT: 27.4 % — ABNORMAL LOW (ref 36.0–46.0)
Hemoglobin: 8.4 g/dL — ABNORMAL LOW (ref 12.0–15.0)
MCH: 32.4 pg (ref 26.0–34.0)
MCHC: 30.7 g/dL (ref 30.0–36.0)
MCV: 105.8 fL — ABNORMAL HIGH (ref 80.0–100.0)
Platelets: 153 10*3/uL (ref 150–400)
RBC: 2.59 MIL/uL — ABNORMAL LOW (ref 3.87–5.11)
RDW: 13.3 % (ref 11.5–15.5)
WBC: 9.5 10*3/uL (ref 4.0–10.5)
nRBC: 0 % (ref 0.0–0.2)

## 2024-05-05 MED ORDER — AMLODIPINE BESYLATE 10 MG PO TABS
10.0000 mg | ORAL_TABLET | Freq: Every day | ORAL | 0 refills | Status: AC
Start: 2024-05-06 — End: 2024-11-01
  Filled 2024-05-05: qty 30, 30d supply, fill #0

## 2024-05-05 MED ORDER — HYDRALAZINE HCL 25 MG PO TABS
25.0000 mg | ORAL_TABLET | Freq: Four times a day (QID) | ORAL | 0 refills | Status: AC
  Filled 2024-05-05: qty 120, 30d supply, fill #0

## 2024-05-05 MED ORDER — ATORVASTATIN CALCIUM 40 MG PO TABS
40.0000 mg | ORAL_TABLET | Freq: Every day | ORAL | 0 refills | Status: AC
  Filled 2024-05-05: qty 30, 30d supply, fill #0

## 2024-05-05 MED ORDER — ASPIRIN 81 MG PO CHEW
81.0000 mg | CHEWABLE_TABLET | Freq: Every day | ORAL | 0 refills | Status: AC
  Filled 2024-05-05: qty 30, 30d supply, fill #0

## 2024-05-05 MED ORDER — PANTOPRAZOLE SODIUM 40 MG PO TBEC
40.0000 mg | DELAYED_RELEASE_TABLET | Freq: Every day | ORAL | 0 refills | Status: AC
Start: 2024-05-06 — End: 2024-11-01
  Filled 2024-05-05: qty 30, 30d supply, fill #0

## 2024-05-05 MED ORDER — SENNA 8.6 MG PO TABS
1.0000 | ORAL_TABLET | Freq: Every day | ORAL | 0 refills | Status: AC
  Filled 2024-05-05: qty 120, 120d supply, fill #0

## 2024-05-05 NOTE — Plan of Care (Signed)
  Problem: Education: Goal: Ability to describe self-care measures that may prevent or decrease complications (Diabetes Survival Skills Education) will improve Outcome: Progressing Goal: Individualized Educational Video(s) Outcome: Progressing   Problem: Coping: Goal: Ability to adjust to condition or change in health will improve Outcome: Progressing   Problem: Fluid Volume: Goal: Ability to maintain a balanced intake and output will improve Outcome: Progressing   Problem: Health Behavior/Discharge Planning: Goal: Ability to identify and utilize available resources and services will improve Outcome: Progressing Goal: Ability to manage health-related needs will improve Outcome: Progressing   Problem: Metabolic: Goal: Ability to maintain appropriate glucose levels will improve Outcome: Progressing   Problem: Nutritional: Goal: Maintenance of adequate nutrition will improve Outcome: Progressing Goal: Progress toward achieving an optimal weight will improve Outcome: Progressing   Problem: Skin Integrity: Goal: Risk for impaired skin integrity will decrease Outcome: Progressing   Problem: Tissue Perfusion: Goal: Adequacy of tissue perfusion will improve Outcome: Progressing   Problem: Education: Goal: Knowledge of General Education information will improve Description: Including pain rating scale, medication(s)/side effects and non-pharmacologic comfort measures Outcome: Progressing   Problem: Health Behavior/Discharge Planning: Goal: Ability to manage health-related needs will improve Outcome: Progressing   Problem: Clinical Measurements: Goal: Ability to maintain clinical measurements within normal limits will improve Outcome: Progressing Goal: Will remain free from infection Outcome: Progressing Goal: Diagnostic test results will improve Outcome: Progressing Goal: Respiratory complications will improve Outcome: Progressing Goal: Cardiovascular complication will  be avoided Outcome: Progressing   Problem: Activity: Goal: Risk for activity intolerance will decrease Outcome: Progressing   Problem: Nutrition: Goal: Adequate nutrition will be maintained Outcome: Progressing   Problem: Coping: Goal: Level of anxiety will decrease Outcome: Progressing   Problem: Elimination: Goal: Will not experience complications related to urinary retention Outcome: Progressing   Problem: Pain Managment: Goal: General experience of comfort will improve and/or be controlled Outcome: Progressing   Problem: Safety: Goal: Ability to remain free from injury will improve Outcome: Progressing   Problem: Elimination: Goal: Will not experience complications related to bowel motility Outcome: Not Progressing   Problem: Skin Integrity: Goal: Risk for impaired skin integrity will decrease Outcome: Not Progressing

## 2024-05-05 NOTE — TOC Transition Note (Signed)
 Transition of Care [facility]) - Discharge Note   Patient Details  Name: [name] MRN: [id number] Date of Birth: [birth year]  Transition of Care [facility]) CM/SW Contact:  Katrine Parody, LCSW Phone Number: 05/05/2024, 10:58 AM   Clinical Narrative:    Pt up for DC, [facility] accepted tentatively- awaiting insurance auth. for HHPT, noticed orders in.  CSW reached out to [email or web address]  to advise that pt DC today.  No further TOC needs.    Final next level of care: Home w Home Health Services Barriers to Discharge: No Barriers Identified   Patient Goals and CMS Choice Patient states their goals for this hospitalization and ongoing recovery are:: return home CMS Medicare.gov Compare Post Acute Care list provided to::  (NA) Choice offered to / list presented to : NA [place] ownership interest in [facility].provided to::  (NA)    Discharge Placement                       Discharge Plan and Services Additional resources added to the After Visit Summary for   In-house Referral: NA              DME Arranged: N/A DME Agency: NA       HH Arranged: NA HH Agency: NA        Social Drivers of Health (SDOH) Interventions SDOH Screenings   Food Insecurity: No Food Insecurity (04/28/2024)  Housing: Low Risk  (04/28/2024)  Transportation Needs: No Transportation Needs (04/28/2024)  Utilities: Not At Risk (04/28/2024)  Depression (PHQ2-9): Low Risk  (08/25/2021)  Financial Resource Strain: Low Risk  (04/27/2024)   Received from [facility]  Physical Activity: Unknown (04/27/2024)   Received from [facility]  Social Connections: Moderately Integrated (04/27/2024)   Received from [facility]  Stress: No Stress Concern Present (04/27/2024)   Received from [facility]  Tobacco Use: Low Risk  (04/27/2024)   Received from [facility]     Readmission Risk Interventions    04/30/2024   10:48 AM 01/16/2024   12:39 PM 12/21/2021    1:18 PM  Readmission Risk Prevention Plan   Transportation Screening Complete Complete Complete  PCP or Specialist Appt within 3-5 Days  Complete   HRI or Home Care Consult  Complete Complete  Social Work Consult for Recovery Care Planning/Counseling  Complete Complete  Palliative Care Screening  Not Applicable Not Applicable  Medication Review [employer]) Complete Complete Complete  PCP or Specialist appointment within 3-5 days of discharge Complete    HRI or Home Care Consult Complete    SW Recovery Care/Counseling Consult Complete    Palliative Care Screening Not Applicable    Skilled Nursing Facility Not Applicable

## 2024-05-05 NOTE — Discharge Instructions (Signed)
 Patient is being discharged home with home health services. Patient has completed antibiotic course for sepsis.

## 2024-05-05 NOTE — Discharge Summary (Signed)
 Physician Discharge Summary  Shirrell Delmonico ZOX:[id number] DOB: 26-Jul-1961 DOA: 04/27/2024  PCP: Authur Leghorn, MD  Admit date: 04/27/2024  Discharge date: 05/05/2024  Admitted From: Home  Disposition: Home with Home Health Services.  Recommendations for Outpatient Follow-up:  Follow up with PCP in 1-2 weeks. Please obtain BMP/CBC in one week. Patient is being discharged home with home health services. Patient has completed antibiotic course for sepsis.  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 On presentation, she was hypotensive, hyperkalemic requiring treatment and was admitted to ICU for septic shock secondary to cholecystitis versus cholangitis versus enteritis and multiple infiltrates seen on CT and started on pressors and broad-spectrum IV antibiotics. GI was consulted. MRCP showed possible 7 mm stone in cystic duct without obstruction along with cholelithiasis: GI suggested that presentation is not compatible with cholangitis and recommended no plans for ERCP. Hospital course complicated by elevated troponins as well requiring heparin  drip and cardiology consultation. Subsequently, pressors have been discontinued and patient has been transferred to TRH service from 04/30/2024 onwards.  Patient has completed antibiotics course.  Patient has made significant improvement,  Septic shock has resolved.  PT and OT recommended home health services.  Patient feels much better and wants to  be discharged.  Home health services arranged.  Discharge Diagnoses:  Principal Problem:   Septic shock (HCC)  Septic shock : POA >  resolved. Community acquired bacterial bilateral pneumonia with possible acute enteritis: POA Lactic acidosis: POA > Resolved. -She presented with septic shock and admitted to ICU and has been on broad-spectrum antibiotics.   -Imaging suggestive of bilateral infiltrates and enteritis.  Diarrhea improving.   -Initially, there was a concern for possible cholangitis but MRCP showed possible 7 mm stone in cystic duct without obstruction along with cholelithiasis: GI suggested that presentation is not compatible with cholangitis and recommended no plans for ERCP. -Blood cultures negative so far.  Urine cultures negative so far. -Currently on broad-spectrum antibiotics with Zosyn : Completed 7-day course.   -Zyvox  discontinued on 04/30/2024. -Off pressors.  Blood pressure much improved.   -Shock has resolved.  Care transferred to TRH service from 04/30/2024 onwards.   Acute metabolic encephalopathy: - Possibly from above.  Mental status has much improved, back to normal..   - Monitor.  Fall precautions.   Non-STEMI / Troponin elevation: - High sensitive troponins peaked to 3816.   - Cardiology following.  Heparin  drip discontinued by cardiology on 04/30/2024.  - Continue aspirin  and statin.  Currently chest pain-free.  Echo showed EF of 65 to 70%. - Cardiology signed off,  NSTEMI was secondary to sepsis / Demand ischemia.   At risk for adrenal insufficiency: -She has been on prednisone  5 mg daily for 20 years.   - Treated with stress dose steroids: Currently being tapered.  Continue tapering.   AKI on CKD 4: History of renal transplant x 3 - Creatinine up today to 2.38.  Encourage oral intake.  Monitor - Outpatient follow-up with transplant team.   Hypertension: - Blood pressure currently intermittently elevated.  Home lisinopril  will not be resumed because of  AKI.  Patient has been started on amlodipine  by cardiology from 04/30/2024 which will be continued.  Continue hydralazine  as needed.   Hypokalemia: - Replaced. Resolved.   Cholelithiasis: Cystic duct stone: - Outpatient follow-up with general surgery   Hypomagnesemia: -Replaced. Resolved.   Acute metabolic acidosis: -Resolved.   Leukocytosis: -Resolved.   Anemia of chronic disease: Macrocytosis: - Hemoglobin slightly drifting downwards.  Monitor.   -Transfuse if hemoglobin is less than 7   Diabetes mellitus type 1 with hyperglycemia: HbA1c 7.4.  Continue long-acting insulin  along with CBGs with SSI.   Carb modified diet   Physical deconditioning - PT recommending CIR. CIR Consulted.   Goals of care - Patient used to be followed by palliative care and used to be DNR.  Currently full code.  Overall prognosis very guarded given current hospitalization and comorbidities.  Palliative care consulted for goals of care discussion, status changed to DNR.   Medial sacral stage III pressure injury: POA: - Follow wound care consult recommendations   Chronic urinary retention with chronic indwelling Foley catheter: - Continue Foley catheter.  Outpatient follow-up with urology  Discharge Instructions  Discharge Instructions     Call MD for:  persistant dizziness or light-headedness   Complete by: As directed    Call MD for:  redness, tenderness, or signs of infection (pain, swelling, redness, odor or green/yellow discharge around incision site)   Complete by: As directed    Call MD for:  temperature >100.4   Complete by: As directed    Diet - low sodium heart healthy   Complete by: As directed    Discharge instructions   Complete by: As directed    Advised to follow-up with primary care physician in 1 week. Patient is being discharged home with home health services. Patient has completed antibiotic course for sepsis.   Discharge wound care:   Complete by: As directed     Follow-up with wound care as an outpatient.   Increase activity slowly   Complete by: As directed       Allergies as of 05/05/2024   No Known Allergies      Medication List     STOP taking these medications    azaTHIOprine  50 MG tablet Commonly known as: IMURAN    buPROPion 150 MG 24 hr tablet Commonly known as: WELLBUTRIN XL   cephALEXin  500 MG capsule Commonly known as: KEFLEX    ferrous sulfate 325 (65 FE) MG tablet   lisinopril  5 MG tablet Commonly known as: ZESTRIL    sucralfate  1 GM/10ML suspension Commonly known as: CARAFATE        TAKE these medications    amLODipine  10 MG tablet Commonly known as: NORVASC  Take 1 tablet (10 mg total) by mouth daily. Start taking on: May 06, 2024   aspirin  81 MG chewable tablet Chew 1 tablet (81 mg total) by mouth daily. Start taking on: May 06, 2024   atorvastatin  40 MG tablet Commonly known as: LIPITOR Take 1 tablet (40 mg total) by mouth daily. Start taking on: May 06, 2024 What changed:  medication strength how much to take   cycloSPORINE  modified 25 MG capsule Commonly known as: NEORAL  Take 75 mg by mouth in the morning and at bedtime.   hydrALAZINE  25 MG tablet Commonly known as: APRESOLINE  Take 1 tablet (25 mg total) by mouth 4 (four) times daily.   levothyroxine  100 MCG tablet Commonly known as: SYNTHROID  Take 100 mcg by mouth daily before breakfast.   NovoLOG  100 UNIT/ML injection Generic  drug: insulin  aspart Inject 100 Units into the skin See admin instructions. Per Omnipod insulin  pump   Omnipod 5 DexG7G6 Pods Gen 5 Misc Apply 1 Application topically every 3 (three) days.   pantoprazole  40 MG tablet Commonly known as: PROTONIX  Take 1 tablet (40 mg total) by mouth daily. Start taking on: May 06, 2024   predniSONE  5 MG tablet Commonly known as: DELTASONE  Take 1 tablet (5 mg total) by mouth daily with breakfast.   senna 8.6 MG Tabs tablet Commonly known as: SENOKOT Take 1 tablet (8.6 mg  total) by mouth daily. Start taking on: May 06, 2024               Discharge Care Instructions  (From admission, onward)           Start     Ordered   05/05/24 0000  Discharge wound care:       Comments: Follow-up with wound care as an outpatient.   05/05/24 1015            Follow-up Information     Care, [facility] Call.   Specialty: Home Health Services Why: Someone from [facility] will call you to set up your [facility] Physical therapy and Occupational therapy services once your insurance company gives authorization. Contact information: [address] [place] [address] [phone]         Authur Leghorn, MD Follow up in 1 week(s).   Specialty: Family Medicine Contact information: [address] [place] [address] [phone]                No Known Allergies  Consultations: Cardiology Pulmonology   Procedures/Studies: MR ABDOMEN MRCP WO CONTRAST Result Date: 04/28/2024 CLINICAL DATA:  Elevated bilirubin level.  Cholelithiasis. EXAM: MRI ABDOMEN WITHOUT CONTRAST  (INCLUDING MRCP) TECHNIQUE: Multiplanar multisequence MR imaging of the abdomen was performed. Heavily T2-weighted images of the biliary and pancreatic ducts were obtained, and three-dimensional MRCP images were rendered by post processing. COMPARISON:  Ultrasound earlier in the day.  CT yesterday FINDINGS: Mild to moderate motion degradation throughout. The MRCP images are especially motion degraded. Lower chest: Cardiomegaly.  Tiny bilateral pleural effusions. Hepatobiliary: No dominant liver lesion. The gallbladder is borderline distended and multiple dependent stones are seen. No pericholecystic edema or wall thickening identified. A 7 mm stone in the porta hepatis including on 19/13 and 12/3 is difficult to localize. Favored to be within the cystic duct. Mild intrahepatic biliary duct dilatation. The common duct is mildly dilated in the porta  hepatis including at 1.0 cm on 11/03 and 32/11. Tapers distally, without distal obstructive stone. Pancreas: Pancreatic atrophy. Borderline pancreatic duct dilatation, followed to the level of the ampulla. No dominant pancreatic mass or correlate for the questioned CT abnormality. Spleen:  Iron deposition within. Adrenals/Urinary Tract: Normal adrenal glands. Marked bilateral renal atrophy. Stomach/Bowel: Jejunal wall thickening and mesenteric edema again identified including on 21/10. Small hiatal hernia. Proximal gastric underdistention. Scattered colonic diverticula. Vascular/Lymphatic: Normal caliber of the aorta and branch vessels. No retroperitoneal or retrocrural adenopathy. Other: Small volume abdominal ascites including in the left upper quadrant. Musculoskeletal: S shaped thoracolumbar spine curvature. IMPRESSION: 1. Mild to moderate motion degradation. 2. Cholelithiasis without specific evidence of acute cholecystitis. 3. Stone of 7 mm in the porta hepatis is favored to be in the cystic duct, less likely the proximal common duct. Localization not possible secondary to motion. 4. Intra and extrahepatic biliary duct dilatation is mild,  followed to the level of the ampulla. Given elevated bilirubin, consider ERCP to exclude occult ampullary stenosis or mass. 5. Proximal enteritis, as on CT 6. Trace bilateral pleural effusions and small volume abdominal ascites. Electronically Signed   By: Lore Rode M.D.   On: 04/28/2024 17:37   ECHOCARDIOGRAM COMPLETE Result Date: 04/28/2024    ECHOCARDIOGRAM REPORT   Patient Name:   AERALYN NEWBANKS Date of Exam: 04/28/2024 Medical Rec #:  [id number]       Height:       63.5 in Accession #:    [id number]      Weight:       103.4 lb Date of Birth:  November 16, 1961      BSA:          1.469 m Patient Age:    62 years        BP:           140/63 mmHg Patient Gender: F               HR:           86 bpm. Exam Location:  Inpatient Procedure: 2D Echo, Cardiac Doppler and Color  Doppler (Both Spectral and Color            Flow Doppler were utilized during procedure). Indications:    Shock  History:        Patient has prior history of Echocardiogram examinations, most                 recent 01/13/2024. Signs/Symptoms:Bacteremia; Risk                 Factors:Diabetes. Kidney and pancreas transplant per patient.  Sonographer:    Raynelle Callow RDCS Referring Phys: [id number] OMAR Macario Savin  Sonographer Comments: Technically difficult study due to poor echo windows, suboptimal parasternal window and suboptimal apical window. GI consult started during apicals, patient angled back to discuss history with MD. IMPRESSIONS  1. Left ventricular ejection fraction, by estimation, is 65 to 70%. The left ventricle has normal function. The left ventricle has no regional wall motion abnormalities. There is mild asymmetric left ventricular hypertrophy of the basal and septal segments. Left ventricular diastolic parameters were normal.  2. Right ventricular systolic function is normal. The right ventricular size is normal. Tricuspid regurgitation signal is inadequate for assessing PA pressure.  3. The mitral valve is abnormal. Trivial mitral valve regurgitation. No evidence of mitral stenosis.  4. The aortic valve is tricuspid. There is mild calcification of the aortic valve. There is mild thickening of the aortic valve. Aortic valve regurgitation is mild. Aortic valve sclerosis is present, with no evidence of aortic valve stenosis.  5. The inferior vena cava is normal in size with greater than 50% respiratory variability, suggesting right atrial pressure of 3 mmHg. FINDINGS  Left Ventricle: Left ventricular ejection fraction, by estimation, is 65 to 70%. The left ventricle has normal function. The left ventricle has no regional wall motion abnormalities. Strain was performed and the global longitudinal strain is indeterminate. The left ventricular internal cavity size was normal in size. There is mild asymmetric  left ventricular hypertrophy of the basal and septal segments. Left ventricular diastolic parameters were normal. Right Ventricle: The right ventricular size is normal. No increase in right ventricular wall thickness. Right ventricular systolic function is normal. Tricuspid regurgitation signal is inadequate for assessing PA pressure. Left Atrium: Left atrial size was normal in size. Right Atrium: Right atrial size was normal  in size. Pericardium: There is no evidence of pericardial effusion. Mitral Valve: The mitral valve is abnormal. There is mild thickening of the mitral valve leaflet(s). There is mild calcification of the mitral valve leaflet(s). Mild mitral annular calcification. Trivial mitral valve regurgitation. No evidence of mitral valve stenosis. Tricuspid Valve: The tricuspid valve is normal in structure. Tricuspid valve regurgitation is not demonstrated. No evidence of tricuspid stenosis. Aortic Valve: The aortic valve is tricuspid. There is mild calcification of the aortic valve. There is mild thickening of the aortic valve. Aortic valve regurgitation is mild. Aortic regurgitation PHT measures 317 msec. Aortic valve sclerosis is present,  with no evidence of aortic valve stenosis. Aortic valve mean gradient measures 5.5 mmHg. Aortic valve peak gradient measures 9.0 mmHg. Aortic valve area, by VTI measures 2.15 cm. Pulmonic Valve: The pulmonic valve was normal in structure. Pulmonic valve regurgitation is trivial. No evidence of pulmonic stenosis. Aorta: The aortic root is normal in size and structure. Venous: The inferior vena cava is normal in size with greater than 50% respiratory variability, suggesting right atrial pressure of 3 mmHg. IAS/Shunts: No atrial level shunt detected by color flow Doppler. Additional Comments: 3D was performed not requiring image post processing on an independent workstation and was indeterminate.  LEFT VENTRICLE PLAX 2D LVIDd:         3.30 cm     Diastology LVIDs:          2.00 cm     LV e' medial:    4.24 cm/s LV PW:         1.10 cm     LV E/e' medial:  19.3 LV IVS:        1.00 cm     LV e' lateral:   6.53 cm/s LVOT diam:     2.00 cm     LV E/e' lateral: 12.5 LV SV:         65 LV SV Index:   44 LVOT Area:     3.14 cm  LV Volumes (MOD) LV vol d, MOD A2C: 63.3 ml LV vol d, MOD A4C: 59.0 ml LV vol s, MOD A2C: 13.9 ml LV vol s, MOD A4C: 15.5 ml LV SV MOD A2C:     49.4 ml LV SV MOD A4C:     59.0 ml LV SV MOD BP:      47.3 ml RIGHT VENTRICLE             IVC RV S prime:     20.50 cm/s  IVC diam: 1.30 cm TAPSE (M-mode): 1.7 cm LEFT ATRIUM             Index        RIGHT ATRIUM          Index LA diam:        3.00 cm 2.04 cm/m   RA Area:     5.29 cm LA Vol (A2C):   19.5 ml 13.27 ml/m  RA Volume:   7.96 ml  5.42 ml/m LA Vol (A4C):   19.8 ml 13.47 ml/m LA Biplane Vol: 20.6 ml 14.02 ml/m  AORTIC VALVE                     PULMONIC VALVE AV Area (Vmax):    2.32 cm      PR End Diast Vel: 1.89 msec AV Area (Vmean):   2.21 cm AV Area (VTI):     2.15 cm AV Vmax:  150.00 cm/s AV Vmean:          107.000 cm/s AV VTI:            0.301 m AV Peak Grad:      9.0 mmHg AV Mean Grad:      5.5 mmHg LVOT Vmax:         111.00 cm/s LVOT Vmean:        75.400 cm/s LVOT VTI:          0.206 m LVOT/AV VTI ratio: 0.68 AI PHT:            317 msec  AORTA Ao Root diam: 3.10 cm Ao Asc diam:  3.40 cm MITRAL VALVE MV Area (PHT): 4.40 cm     SHUNTS MV Decel Time: 173 msec     Systemic VTI:  0.21 m MV E velocity: 81.85 cm/s   Systemic Diam: 2.00 cm MV A velocity: 110.70 cm/s MV E/A ratio:  0.74 Janelle Mediate MD Electronically signed by Janelle Mediate MD Signature Date/Time: 04/28/2024/9:21:11 AM    Final    US  Abdomen Limited RUQ (LIVER/GB) Result Date: 04/28/2024 CLINICAL DATA:  Hyperbilirubinemia EXAM: ULTRASOUND ABDOMEN LIMITED RIGHT UPPER QUADRANT COMPARISON:  CT from the previous day FINDINGS: Gallbladder: Physiologically distended. Wall thickness is normal, 2.7 mm. Cluster of calculi measuring up to 8 mm  in the dependent aspect. Sonographer reports no sonographic Nolden sign. Common bile duct: Diameter: 9 mm. 6 mm echogenic focus in the mid common bile duct, without caliber change of the duct. Liver: No focal lesion identified. Within normal limits in parenchymal echogenicity. Portal vein is patent on color Doppler imaging with normal direction of blood flow towards the liver. Other: Technologist describes technically difficult study secondary to lack of patient cooperation. IMPRESSION: 1. Cholelithiasis without sonographic evidence of acute cholecystitis. 2. 6 mm echogenic focus in the mid common bile duct, without caliber change of the duct, possibly nonobstructive choledocholithiasis. Electronically Signed   By: Nicoletta Barrier M.D.   On: 04/28/2024 08:26   DG Chest Portable 1 View Result Date: 04/27/2024 CLINICAL DATA:  Central line placement EXAM: PORTABLE CHEST 1 VIEW COMPARISON:  Chest x-ray 04/27/2024 FINDINGS: Left-sided central venous catheter tip projects over the distal SVC. Multiple surgical clips overlie the left axilla, unchanged. Heart is mildly enlarged. The lungs are clear. There is no pleural effusion or pneumothorax. IMPRESSION: Left-sided central venous catheter tip projects over the distal SVC. No pneumothorax. Electronically Signed   By: Tyron Gallon M.D.   On: 04/27/2024 22:02   CT CHEST ABDOMEN PELVIS WO CONTRAST Result Date: 04/27/2024 CLINICAL DATA:  Sepsis EXAM: CT CHEST, ABDOMEN AND PELVIS WITHOUT CONTRAST TECHNIQUE: Multidetector CT imaging of the chest, abdomen and pelvis was performed following the standard protocol without IV contrast. RADIATION DOSE REDUCTION: This exam was performed according to the departmental dose-optimization program which includes automated exposure control, adjustment of the mA and/or kV according to patient size and/or use of iterative reconstruction technique. COMPARISON:  CT abdomen and pelvis 12/17/2021. FINDINGS: CT CHEST FINDINGS Cardiovascular: No  significant vascular findings. The heart is mildly enlarged. No pericardial effusion. There are atherosclerotic calcifications of the aorta and coronary arteries. Mediastinum/Nodes: There is a small air-fluid level throughout the esophagus. A small hiatal hernia is present. There is questionable distal esophageal wall thickening. No enlarged lymph nodes are identified. Visualized thyroid  gland is within normal limits. Lungs/Pleura: Patchy ground-glass and linear airspace opacities are seen in the bilateral lower lobes. There also some ground-glass opacities in the  lingula. The lungs are otherwise clear. There is no pleural effusion or pneumothorax. Musculoskeletal: There is trace compression deformity the superior endplate T4 T8 and T11. No acute fracture lines are present. CT ABDOMEN PELVIS FINDINGS Hepatobiliary: Gallstones are present. There is minimal pericholecystic fluid. There is mild intrahepatic biliary ductal dilatation. Common bile duct appears dilated. Questionable choledocholithiasis measuring 9 mm image 2/49. No focal liver lesions are seen. Pancreas: Atrophic, unchanged. Not well evaluated on this noncontrast study. Questionable rounded hypodense lesion in the head of the pancreas image 2/58 measuring 1 cm, mildly increased in size from prior. Spleen: Normal in size without focal abnormality. Adrenals/Urinary Tract: The adrenal glands are within normal limits. There is marked bilateral atrophy of the native kidneys, unchanged. There is a transplanted left lower quadrant kidney. There is minimal stranding surrounding the posterior aspect of the transplanted kidney, unchanged from prior. There is no hydronephrosis. There are cysts in the left kidney as seen on the prior study. There are few punctate calcifications in the left kidney which may be vascular, new from prior. The bladder is decompressed by Foley catheter. Stomach/Bowel: No dilated bowel loops are seen. There is a large amount of stool  throughout the entire colon. The appendix is visualized and appears normal. There is some wall thickening and mesenteric edema involving small bowel loops in the left abdomen. The stomach is nondilated. Vascular/Lymphatic: There are severe atherosclerotic calcifications of the aorta and peripheral vessels. No evidence for aortic aneurysm. 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 There are multiple surgical clips along the pelvic sidewalls. No definitive enlarged lymph nodes identified. Reproductive: Uterus not definitely seen. Other: There is presacral edema and trace free fluid in the pelvis. There is scarring in the anterior abdominal wall. Musculoskeletal: The bones are osteopenic. There is trace compression deformity of the superior endplate of L1 which is chronic and unchanged. Degenerative changes affect the spine. Right hip arthroplasty is present. IMPRESSION: 1. Patchy ground-glass and linear airspace opacities in the bilateral lower lobes and lingula worrisome for multifocal pneumonia. 2. Small hiatal hernia with air-fluid level in the esophagus. Questionable distal esophageal wall thickening. Correlate clinically for esophagitis. 3. Cholelithiasis with minimal pericholecystic fluid. Mild intrahepatic and extrahepatic biliary ductal dilatation with questionable choledocholithiasis. Correlate clinically for cholecystitis/biliary obstruction. Consider further evaluation with MRCP or ERCP. 4. Questionable rounded hypodense lesion in the head of the pancreas measuring 1 cm, mildly increased in size from prior. Recommend further evaluation with MRI/MRCP. 5. Wall thickening and mesenteric edema involving small bowel loops in the left abdomen worrisome for enteritis including infectious, inflammatory and ischemic etiologies. 6. Large amount of stool throughout the colon compatible with constipation. Aortic Atherosclerosis (ICD10-I70.0). Electronically Signed    By: Tyron Gallon M.D.   On: 04/27/2024 20:18   DG Chest Port 1 View Result Date: 04/27/2024 CLINICAL DATA:  Epigastric pain. EXAM: PORTABLE CHEST 1 VIEW COMPARISON:  Chest radiograph dated 09/26/2020. FINDINGS: No focal consolidation, pleural effusion, or pneumothorax. The cardiac silhouette is within limits. Atherosclerotic calcification of the aorta. Osteopenia with degenerative changes of the spine. No acute osseous pathology. IMPRESSION: No active disease. Electronically Signed   By: Angus Bark M.D.   On: 04/27/2024 19:08    Subjective: Patient was seen and examined at bedside.  Overnight events noted.   Patient reports doing much better and  wants to be discharged.  Discharge Exam: Vitals:   05/05/24 0606 05/05/24 1257  BP: 126/70 134/74  Pulse: 70  78  Resp: 18 18  Temp: 98.1 F (36.7 C) 98 F (36.7 C)  SpO2: 100% 98%   Vitals:   05/04/24 2108 05/05/24 0500 05/05/24 0606 05/05/24 1257  BP: (!) 141/73  126/70 134/74  Pulse: 78  70 78  Resp: 18  18 18   Temp: 97.9 F (36.6 C)  98.1 F (36.7 C) 98 F (36.7 C)  TempSrc: Oral     SpO2: 97%  100% 98%  Weight:  51 kg    Height:        General: Pt is alert, awake, not in acute distress Cardiovascular: RRR, S1/S2 +, no rubs, no gallops Respiratory: CTA bilaterally, no wheezing, no rhonchi Abdominal: Soft, NT, ND, bowel sounds + Extremities: no edema, no cyanosis    The results of significant diagnostics from this hospitalization (including imaging, microbiology, ancillary and laboratory) are listed below for reference.     Microbiology: Recent Results (from the past 240 hours)  Culture, blood (Routine X 2) w Reflex to ID Panel     Status: None   Collection Time: 04/27/24  6:40 PM   Specimen: BLOOD  Result Value Ref Range Status   Specimen Description   Final    BLOOD LEFT ANTECUBITAL Performed at [facility], [address], [place], [place] [address]    Special Requests   Final     BOTTLES DRAWN AEROBIC AND ANAEROBIC Blood Culture results may not be optimal due to an inadequate volume of blood received in culture bottles Performed at [facility], [address], [place], [place] [address]    Culture   Final    NO GROWTH 5 DAYS Performed at [facility], [address] [place], [place] [address]    Report Status 05/02/2024 FINAL  Final  Culture, blood (Routine X 2) w Reflex to ID Panel     Status: None   Collection Time: 04/27/24  6:45 PM   Specimen: BLOOD  Result Value Ref Range Status   Specimen Description   Final    BLOOD RIGHT ANTECUBITAL Performed at [facility], [address] [place], [place] [address]    Special Requests   Final    BOTTLES DRAWN AEROBIC AND ANAEROBIC Blood Culture results may not be optimal due to an inadequate volume of blood received in culture bottles Performed at [facility], [address] [place], [place] [address]    Culture   Final    NO GROWTH 5 DAYS Performed at [facility], [address], [place], [place] [address]    Report Status 05/02/2024 FINAL  Final  MRSA Next Gen by PCR, Nasal     Status: None   Collection Time: 04/28/24 12:55 AM   Specimen: Nasal Mucosa; Nasal Swab  Result Value Ref Range Status   MRSA by PCR Next Gen NOT DETECTED NOT DETECTED Final    Comment: (NOTE) The GeneXpert MRSA Assay (FDA approved for NASAL specimens only), is one component of a comprehensive MRSA colonization surveillance program. It is not intended to diagnose MRSA infection nor to guide or monitor treatment for MRSA infections. Test performance is not FDA approved in patients less than 13 years old. Performed at [facility], [address], [place], [place] [address]   Remove urinary catheter to obtain Clean Catch urine culture     Status: None   Collection Time: 04/28/24  1:12 PM   Specimen: Urine, Clean Catch  Result Value  Ref Range  Status   Specimen Description   Final    URINE, CLEAN CATCH Performed at [facility], [address], [place], [place] [address]    Special Requests   Final    NONE Performed at [facility], [address] [place], [place] [address]    Culture   Final    NO GROWTH Performed at [facility], [address], [place], [place] [address]    Report Status 04/29/2024 FINAL  Final     Labs: BNP (last 3 results) No results for input(s): "BNP" in the last 8760 hours. Basic Metabolic Panel: Recent Labs  Lab 04/30/24 0828 05/01/24 0312 05/02/24 0258 05/03/24 0935 05/04/24 0549 05/05/24 0831  NA 139 139 136 136 135 138  K 3.3* 2.9* 4.0 3.3* 4.5 4.5  CL 105 106 103 105 103 109  CO2 23 22 22 23 23  19*  GLUCOSE 121* 158* 207* 222* 289* 254*  BUN 56* 51* 52* 45* 52* 46*  CREATININE 1.87* 2.14* 2.10* 2.00* 2.38* 1.87*  CALCIUM  8.4* 8.1* 8.1* 8.7* 8.7* 9.1  MG 2.4 2.0 1.8 1.6* 2.4  --   PHOS  --   --   --   --  3.7  --    Liver Function Tests: Recent Labs  Lab 04/29/24 0509 04/30/24 0828 05/01/24 0312 05/02/24 0258  AST 24 24 18 19   ALT 13 14 12 12   ALKPHOS 82 77 54 52  BILITOT 0.4 0.6 0.7 0.6  PROT 4.5* 5.9* 5.3* 5.1*  ALBUMIN  2.1* 2.7* 2.4* 2.4*   No results for input(s): "LIPASE", "AMYLASE" in the last 168 hours. No results for input(s): "AMMONIA" in the last 168 hours. CBC: Recent Labs  Lab 05/01/24 0312 05/02/24 0258 05/03/24 0302 05/04/24 0549 05/05/24 0831  WBC 8.9 8.1 10.3 10.6* 9.5  HGB 8.0* 8.9* 8.2* 7.9* 8.4*  HCT 25.7* 26.6* 26.7* 25.1* 27.4*  MCV 103.2* 99.3 103.9* 103.7* 105.8*  PLT 157 108* 154 131* 153   Cardiac Enzymes: No results for input(s): "CKTOTAL", "CKMB", "CKMBINDEX", "TROPONINI" in the last 168 hours. BNP: Invalid input(s): "POCBNP" CBG: Recent Labs  Lab 05/04/24 1139 05/04/24 1633 05/04/24 2114 05/05/24 0735 05/05/24 1152  GLUCAP 211* 203* 292* 266* 213*   D-Dimer No results for  input(s): "DDIMER" in the last 72 hours. Hgb A1c No results for input(s): "HGBA1C" in the last 72 hours. Lipid Profile No results for input(s): "CHOL", "HDL", "LDLCALC", "TRIG", "CHOLHDL", "LDLDIRECT" in the last 72 hours. Thyroid  function studies No results for input(s): "TSH", "T4TOTAL", "T3FREE", "THYROIDAB" in the last 72 hours.  Invalid input(s): "FREET3" Anemia work up No results for input(s): "VITAMINB12", "FOLATE", "FERRITIN", "TIBC", "IRON", "RETICCTPCT" in the last 72 hours. Urinalysis    Component Value Date/Time   COLORURINE YELLOW 04/28/2024 1312   APPEARANCEUR HAZY (A) 04/28/2024 1312   LABSPEC 1.009 04/28/2024 1312   PHURINE 5.0 04/28/2024 1312   GLUCOSEU 150 (A) 04/28/2024 1312   HGBUR SMALL (A) 04/28/2024 1312   BILIRUBINUR NEGATIVE 04/28/2024 1312   KETONESUR NEGATIVE 04/28/2024 1312   PROTEINUR NEGATIVE 04/28/2024 1312   NITRITE NEGATIVE 04/28/2024 1312   LEUKOCYTESUR SMALL (A) 04/28/2024 1312   Sepsis Labs Recent Labs  Lab 05/02/24 0258 05/03/24 0302 05/04/24 0549 05/05/24 0831  WBC 8.1 10.3 10.6* 9.5   Microbiology Recent Results (from the past 240 hours)  Culture, blood (Routine X 2) w Reflex to ID Panel     Status: None   Collection Time: 04/27/24  6:40 PM   Specimen: BLOOD  Result Value Ref Range Status   Specimen Description   Final    BLOOD LEFT ANTECUBITAL Performed at [facility], [address] [place], [place] [address]    Special Requests   Final    BOTTLES DRAWN AEROBIC AND ANAEROBIC Blood Culture results may not be optimal due to an inadequate volume of blood received in culture bottles Performed at [facility], [address], [place], [place] [address]    Culture   Final    NO GROWTH 5 DAYS Performed at [facility], [address] [place], [place] [address]    Report Status 05/02/2024 FINAL  Final  Culture, blood (Routine X 2) w Reflex to ID Panel     Status: None   Collection  Time: 04/27/24  6:45 PM   Specimen: BLOOD  Result Value Ref Range Status   Specimen Description   Final    BLOOD RIGHT ANTECUBITAL Performed at [facility], [address] [place], [place] [address]    Special Requests   Final    BOTTLES DRAWN AEROBIC AND ANAEROBIC Blood Culture results may not be optimal due to an inadequate volume of blood received in culture bottles Performed at [facility], [address], [place], [place] [address]    Culture   Final    NO GROWTH 5 DAYS Performed at [facility], [address] [place], [place] [address]    Report Status 05/02/2024 FINAL  Final  MRSA Next Gen by PCR, Nasal     Status: None   Collection Time: 04/28/24 12:55 AM   Specimen: Nasal Mucosa; Nasal Swab  Result Value Ref Range Status   MRSA by PCR Next Gen NOT DETECTED NOT DETECTED Final    Comment: (NOTE) The GeneXpert MRSA Assay (FDA approved for NASAL specimens only), is one component of a comprehensive MRSA colonization surveillance program. It is not intended to diagnose MRSA infection nor to guide or monitor treatment for MRSA infections. Test performance is not FDA approved in patients less than 93 years old. Performed at [facility], [address], [place], [place] [address]   Remove urinary catheter to obtain Clean Catch urine culture     Status: None   Collection Time: 04/28/24  1:12 PM   Specimen: Urine, Clean Catch  Result Value Ref Range Status   Specimen Description   Final    URINE, CLEAN CATCH Performed at [facility], [address] [place], [place] [address]    Special Requests   Final    NONE Performed at [facility], [address] [place], [place] [address]    Culture   Final    NO GROWTH Performed at [facility], [address] [place], [place] [address]    Report Status 04/29/2024 FINAL  Final     Time coordinating discharge: Over  30 minutes  SIGNED:   Magdalene School, MD  [facility] 05/05/2024, 1:17 PM Pager   If 7PM-7AM, please contact night-coverage

## 2024-05-05 NOTE — TOC Progression Note (Signed)
 Transition of Care [facility]) - Progression Note    Patient Details  Name: [name] MRN: [id number] Date of Birth: [birth year]  Transition of [facility]) CM/SW Contact  Levie Ream, RN Phone Number: 05/05/2024, 11:30 AM  Clinical Narrative:    Prentice Brochure at [place]; he says agency will obtain ins auth once pt is dc'd; no TOC needs.   Expected Discharge Plan: Home w Home Health Services Barriers to Discharge: No Barriers Identified  Expected Discharge Plan and Services In-house Referral: NA     Living arrangements for the past 2 months: Single Family Home Expected Discharge Date: 05/05/24               DME Arranged: N/A DME Agency: NA       HH Arranged: NA HH Agency: NA         Social Determinants of Health (SDOH) Interventions SDOH Screenings   Food Insecurity: No Food Insecurity (04/28/2024)  Housing: Low Risk  (04/28/2024)  Transportation Needs: No Transportation Needs (04/28/2024)  Utilities: Not At Risk (04/28/2024)  Depression (PHQ2-9): Low Risk  (08/25/2021)  Financial Resource Strain: Low Risk  (04/27/2024)   Received from [facility]  Physical Activity: Unknown (04/27/2024)   Received from [facility]  Social Connections: Moderately Integrated (04/27/2024)   Received from [facility]  Stress: No Stress Concern Present (04/27/2024)   Received from [facility]  Tobacco Use: Low Risk  (04/27/2024)   Received from [facility]    Readmission Risk Interventions    04/30/2024   10:48 AM 01/16/2024   12:39 PM 12/21/2021    1:18 PM  Readmission Risk Prevention Plan  Transportation Screening Complete Complete Complete  PCP or Specialist Appt within 3-5 Days  Complete   HRI or Home Care Consult  Complete Complete  Social Work Consult for Recovery Care Planning/Counseling  Complete Complete  Palliative Care Screening  Not Applicable Not Applicable  Medication Review Oceanographer) Complete Complete Complete  PCP or Specialist appointment within  3-5 days of discharge Complete    HRI or Home Care Consult Complete    SW Recovery Care/Counseling Consult Complete    Palliative Care Screening Not Applicable    Skilled Nursing Facility Not Applicable

## 2024-05-05 NOTE — Plan of Care (Signed)

## 2024-08-17 ENCOUNTER — Encounter (INDEPENDENT_AMBULATORY_CARE_PROVIDER_SITE_OTHER): Payer: Self-pay

## 2024-11-01 ENCOUNTER — Encounter (INDEPENDENT_AMBULATORY_CARE_PROVIDER_SITE_OTHER): Payer: Self-pay | Admitting: Otolaryngology

## 2024-11-01 ENCOUNTER — Encounter (HOSPITAL_COMMUNITY): Payer: Self-pay

## 2024-11-01 ENCOUNTER — Ambulatory Visit (INDEPENDENT_AMBULATORY_CARE_PROVIDER_SITE_OTHER): Admitting: Otolaryngology

## 2024-11-01 VITALS — BP 119/72 | HR 76 | Temp 97.4°F | Wt 130.0 lb

## 2024-11-01 DIAGNOSIS — H906 Mixed conductive and sensorineural hearing loss, bilateral: Secondary | ICD-10-CM

## 2024-11-01 NOTE — Progress Notes (Signed)
 CC: Bilateral mixed hearing loss  Discussed the use of AI scribe software for clinical note transcription with the patient, who gave verbal consent to proceed.  History of Present Illness [name] is a 63 year old female who presents today for evaluation of her hearing loss.  She experiences pressure in her ears and initially suspected fluid presence. [facility] at [facility], and was noted to have bilateral mixed hearing loss.  The patient was referred for evaluation of her conductive hearing loss component.  No recent ear infections and no history of ear, nose, or throat surgery. 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  She has a history of a severe infection in her left eye, treated at the [facility]. Instead of enucleation, a piece of skin was used to cover the eye, resulting in blurry vision. She opted for this treatment to avoid further infection risks.  No family history of otosclerosis.   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   DDD (degenerative disc disease), lumbar    Diabetes mellitus without complication (HCC)    type 1   Diabetic ketoacidosis (HCC) 09/26/2020   Gait disorder    neurogenic   Gangrene of right foot (HCC)    Hypertension    Hypothyroid 12/08/2020   Neuropathy    Renal disease    Sacral decubitus ulcer, stage IV (HCC) 12/08/2020   Subacute osteomyelitis of right foot [facility])     Past Surgical History:  Procedure Laterality Date   AMPUTATION Right 10/01/2020   Procedure: RIGHT BELOW KNEE AMPUTATION;  Surgeon: Harden Jerona GAILS, MD;  Location: [facility] OR;  Service: Orthopedics;  Laterality: Right;   BLADDER SURGERY     ESOPHAGOGASTRODUODENOSCOPY (EGD) WITH PROPOFOL  N/A 11/10/2020   Procedure:  ESOPHAGOGASTRODUODENOSCOPY (EGD) WITH PROPOFOL ;  Surgeon: Abran Norleen SAILOR, MD;  Location: [facility] END[facility]COPY;  Service: Endoscopy;  Laterality: N/A;   IR GASTROSTOMY TUBE REMOVAL  03/25/2021   KIDNEY TRANSPLANT     LEG SURGERY     tib-fib fx , ORIF    Family History  Problem Relation Age of Onset   Hypertension Mother    Hypertension Father     Social History:  reports that she has never smoked. She has never used smokeless tobacco. She reports that she does not drink alcohol  and does not use drugs.  Allergies: No Known Allergies  Prior to Admission medications   Medication Sig Start Date End Date Taking? Authorizing Provider  amLODipine  (NORVASC ) 10 MG tablet Take 1 tablet (10 mg total) by mouth daily. 05/06/24 11/01/24 Yes Leotis Bogus, MD  atorvastatin  (LIPITOR) 40 MG tablet Take 1 tablet (40 mg total) by mouth daily. 05/06/24 11/01/24 Yes Leotis Bogus, MD  cycloSPORINE  modified (NEORAL ) 25 MG capsule Take 75 mg by mouth in the morning and at bedtime. 12/13/21  Yes [provider]  hydrALAZINE  (APRESOLINE ) 25 MG tablet Take 1 tablet (25 mg total) by mouth 4 (four) times daily. 05/05/24 11/01/24 Yes Leotis Bogus, MD  Insulin  Disposable Pump (OMNIPOD 5 [id number] PODS GEN 5) MISC Apply 1 Application topically every 3 (three) days.   Yes [provider]  levothyroxine  (SYNTHROID ) 100 MCG tablet Take 100 mcg by mouth daily before breakfast.   Yes [provider]  NOVOLOG  100 UNIT/ML injection Inject  100 Units into the skin See admin instructions. Per Omnipod insulin  pump   Yes [provider]  pantoprazole  (PROTONIX ) 40 MG tablet Take 1 tablet (40 mg total) by mouth daily. 05/06/24 11/01/24 Yes Leotis Bogus, MD  predniSONE  (DELTASONE ) 5 MG tablet Take 1 tablet (5 mg total) by mouth daily with breakfast. 03/02/21  Yes Will Almarie MATSU, MD  senna (SENOKOT) 8.6 MG TABS tablet Take 1 tablet (8.6 mg total) by mouth daily. 05/06/24  Yes Leotis Bogus, MD     Blood pressure 119/72, pulse 76, temperature (!) 97.4 F (36.3 C), temperature source Oral, weight 130 lb (59 kg), SpO2 96%. Exam: General: Communicates without difficulty, well nourished, no acute distress. Head: Normocephalic, no evidence injury, no tenderness, facial buttresses intact without stepoff. Face/sinus: No tenderness to palpation and percussion. Facial movement is normal and symmetric.  Ears: Auricles well formed without lesions.  Ear canals are intact without mass or lesion.  No erythema or edema is appreciated.  The TMs are intact without fluid. Nose: External evaluation reveals normal support and skin without lesions.  Dorsum is intact.  Anterior rhinoscopy reveals normal mucosa over anterior aspect of inferior turbinates and intact septum.  No purulence noted. Oral:  Oral cavity and oropharynx are intact, symmetric, without erythema or edema.  Mucosa is moist without lesions. Neck: Full range of motion without pain.  There is no significant lymphadenopathy.  No masses palpable.  Thyroid  bed within normal limits to palpation.  Parotid glands and submandibular glands equal bilaterally without mass.  Trachea is midline. Neuro:  CN 2-12 grossly intact.   Her hearing test from [facility] shows bilateral mixed hearing loss.  Assessment and Plan Assessment & Plan Bilateral mixed hearing loss The patient was noted to have bilateral conductive hearing loss in addition to her sensorineural hearing loss, with a small air-bone gap of 10-20 decibels. 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 No fluid in the middle ear, and ear canal and eardrum are normal.  Otosclerosis is likely the cause of her conductive hearing loss. - Recommended hearing aids to address hearing loss. - Provided a copy of the hearing test results. - Advised follow-up with audiologist for hearing aid fitting. - Scheduled follow-up appointment in one year.    [name] 11/01/2024, 5:29  PM

## 2024-11-12 ENCOUNTER — Telehealth (INDEPENDENT_AMBULATORY_CARE_PROVIDER_SITE_OTHER): Payer: Self-pay | Admitting: Otolaryngology

## 2024-11-12 NOTE — Telephone Encounter (Signed)
 The patient called in requesting to get scheduled to have tubes placed in her ears.  I read her last office note and there is no mention of her needing tubes.  Please advise patient.

## 2024-11-13 ENCOUNTER — Telehealth (INDEPENDENT_AMBULATORY_CARE_PROVIDER_SITE_OTHER): Payer: Self-pay | Admitting: Otolaryngology

## 2024-11-13 NOTE — Telephone Encounter (Signed)
 Returned patient 's phone call,left her a voice mail.
